# Patient Record
Sex: Male | Born: 1965 | Race: Black or African American | Hispanic: No | Marital: Single | Smoking: Never smoker
Health system: Southern US, Community
[De-identification: ages and names within clinical notes are randomized; demographics above are authoritative.]

## PROBLEM LIST (undated history)

## (undated) DIAGNOSIS — D638 Anemia in other chronic diseases classified elsewhere: Secondary | ICD-10-CM

## (undated) DIAGNOSIS — J189 Pneumonia, unspecified organism: Secondary | ICD-10-CM

## (undated) DIAGNOSIS — R0601 Orthopnea: Secondary | ICD-10-CM

## (undated) DIAGNOSIS — Z992 Dependence on renal dialysis: Secondary | ICD-10-CM

## (undated) DIAGNOSIS — H9190 Unspecified hearing loss, unspecified ear: Secondary | ICD-10-CM

## (undated) DIAGNOSIS — L409 Psoriasis, unspecified: Secondary | ICD-10-CM

## (undated) DIAGNOSIS — I509 Heart failure, unspecified: Secondary | ICD-10-CM

## (undated) DIAGNOSIS — I472 Ventricular tachycardia: Secondary | ICD-10-CM

## (undated) DIAGNOSIS — N189 Chronic kidney disease, unspecified: Secondary | ICD-10-CM

## (undated) DIAGNOSIS — K219 Gastro-esophageal reflux disease without esophagitis: Secondary | ICD-10-CM

## (undated) DIAGNOSIS — I4729 Other ventricular tachycardia: Secondary | ICD-10-CM

## (undated) DIAGNOSIS — J9611 Chronic respiratory failure with hypoxia: Secondary | ICD-10-CM

## (undated) DIAGNOSIS — I5042 Chronic combined systolic (congestive) and diastolic (congestive) heart failure: Secondary | ICD-10-CM

## (undated) DIAGNOSIS — I4901 Ventricular fibrillation: Secondary | ICD-10-CM

## (undated) DIAGNOSIS — I272 Pulmonary hypertension, unspecified: Secondary | ICD-10-CM

## (undated) DIAGNOSIS — G4733 Obstructive sleep apnea (adult) (pediatric): Secondary | ICD-10-CM

## (undated) DIAGNOSIS — E119 Type 2 diabetes mellitus without complications: Secondary | ICD-10-CM

## (undated) DIAGNOSIS — G510 Bell's palsy: Secondary | ICD-10-CM

## (undated) DIAGNOSIS — R0600 Dyspnea, unspecified: Secondary | ICD-10-CM

## (undated) DIAGNOSIS — Z9981 Dependence on supplemental oxygen: Secondary | ICD-10-CM

## (undated) DIAGNOSIS — N19 Unspecified kidney failure: Secondary | ICD-10-CM

## (undated) DIAGNOSIS — J449 Chronic obstructive pulmonary disease, unspecified: Secondary | ICD-10-CM

## (undated) DIAGNOSIS — I2510 Atherosclerotic heart disease of native coronary artery without angina pectoris: Secondary | ICD-10-CM

## (undated) DIAGNOSIS — I10 Essential (primary) hypertension: Secondary | ICD-10-CM

## (undated) DIAGNOSIS — E7800 Pure hypercholesterolemia, unspecified: Secondary | ICD-10-CM

## (undated) DIAGNOSIS — H269 Unspecified cataract: Secondary | ICD-10-CM

## (undated) DIAGNOSIS — N186 End stage renal disease: Secondary | ICD-10-CM

## (undated) DIAGNOSIS — M109 Gout, unspecified: Secondary | ICD-10-CM

## (undated) HISTORY — DX: Anemia in other chronic diseases classified elsewhere: D63.8

## (undated) HISTORY — PX: EYE SURGERY: SHX253

## (undated) HISTORY — DX: End stage renal disease: Z99.2

## (undated) HISTORY — PX: CORONARY ANGIOPLASTY WITH STENT PLACEMENT: SHX49

## (undated) HISTORY — PX: NO PAST SURGERIES: SHX2092

## (undated) HISTORY — DX: Chronic kidney disease, unspecified: N18.9

## (undated) HISTORY — DX: Chronic respiratory failure with hypoxia: J96.11

## (undated) HISTORY — DX: Pulmonary hypertension, unspecified: I27.20

## (undated) HISTORY — DX: Psoriasis, unspecified: L40.9

## (undated) HISTORY — DX: Ventricular fibrillation: I49.01

## (undated) HISTORY — DX: Obstructive sleep apnea (adult) (pediatric): G47.33

## (undated) HISTORY — DX: Chronic combined systolic (congestive) and diastolic (congestive) heart failure: I50.42

## (undated) HISTORY — DX: Dependence on renal dialysis: N18.6

---

## 1998-12-17 DIAGNOSIS — E1169 Type 2 diabetes mellitus with other specified complication: Secondary | ICD-10-CM | POA: Insufficient documentation

## 2009-07-13 ENCOUNTER — Emergency Department: Payer: Self-pay | Admitting: Emergency Medicine

## 2009-12-30 ENCOUNTER — Inpatient Hospital Stay: Payer: Self-pay | Admitting: Internal Medicine

## 2012-08-19 DIAGNOSIS — N185 Chronic kidney disease, stage 5: Secondary | ICD-10-CM | POA: Insufficient documentation

## 2012-08-19 DIAGNOSIS — N186 End stage renal disease: Secondary | ICD-10-CM | POA: Insufficient documentation

## 2012-08-19 DIAGNOSIS — N183 Chronic kidney disease, stage 3 (moderate): Secondary | ICD-10-CM

## 2012-08-19 DIAGNOSIS — I429 Cardiomyopathy, unspecified: Secondary | ICD-10-CM | POA: Insufficient documentation

## 2015-06-14 DIAGNOSIS — I2510 Atherosclerotic heart disease of native coronary artery without angina pectoris: Secondary | ICD-10-CM | POA: Insufficient documentation

## 2016-06-13 ENCOUNTER — Emergency Department
Admission: EM | Admit: 2016-06-13 | Discharge: 2016-06-13 | Disposition: A | Discharge status: Home / Self Care | Payer: BLUE CROSS/BLUE SHIELD | Attending: Emergency Medicine | Admitting: Emergency Medicine

## 2016-06-13 ENCOUNTER — Emergency Department: Payer: BLUE CROSS/BLUE SHIELD

## 2016-06-13 DIAGNOSIS — I509 Heart failure, unspecified: Secondary | ICD-10-CM | POA: Diagnosis not present

## 2016-06-13 DIAGNOSIS — L97812 Non-pressure chronic ulcer of other part of right lower leg with fat layer exposed: Secondary | ICD-10-CM | POA: Diagnosis not present

## 2016-06-13 DIAGNOSIS — Z794 Long term (current) use of insulin: Secondary | ICD-10-CM | POA: Insufficient documentation

## 2016-06-13 DIAGNOSIS — E119 Type 2 diabetes mellitus without complications: Secondary | ICD-10-CM | POA: Insufficient documentation

## 2016-06-13 DIAGNOSIS — L03115 Cellulitis of right lower limb: Secondary | ICD-10-CM | POA: Diagnosis not present

## 2016-06-13 DIAGNOSIS — I110 Hypertensive heart disease with heart failure: Secondary | ICD-10-CM | POA: Insufficient documentation

## 2016-06-13 DIAGNOSIS — Z79899 Other long term (current) drug therapy: Secondary | ICD-10-CM | POA: Diagnosis not present

## 2016-06-13 DIAGNOSIS — M7989 Other specified soft tissue disorders: Secondary | ICD-10-CM

## 2016-06-13 DIAGNOSIS — Z7982 Long term (current) use of aspirin: Secondary | ICD-10-CM | POA: Insufficient documentation

## 2016-06-13 DIAGNOSIS — L97912 Non-pressure chronic ulcer of unspecified part of right lower leg with fat layer exposed: Secondary | ICD-10-CM

## 2016-06-13 HISTORY — DX: Type 2 diabetes mellitus without complications: E11.9

## 2016-06-13 HISTORY — DX: Heart failure, unspecified: I50.9

## 2016-06-13 HISTORY — DX: Bell's palsy: G51.0

## 2016-06-13 HISTORY — DX: Ventricular tachycardia: I47.2

## 2016-06-13 HISTORY — DX: Essential (primary) hypertension: I10

## 2016-06-13 HISTORY — DX: Other ventricular tachycardia: I47.29

## 2016-06-13 HISTORY — DX: Gout, unspecified: M10.9

## 2016-06-13 LAB — CBC
HCT: 40.5 % (ref 40.0–52.0)
Hemoglobin: 13.2 g/dL (ref 13.0–18.0)
MCH: 25.9 pg — ABNORMAL LOW (ref 26.0–34.0)
MCHC: 32.6 g/dL (ref 32.0–36.0)
MCV: 79.7 fL — ABNORMAL LOW (ref 80.0–100.0)
Platelets: 193 10*3/uL (ref 150–440)
RBC: 5.08 MIL/uL (ref 4.40–5.90)
RDW: 18.4 % — ABNORMAL HIGH (ref 11.5–14.5)
WBC: 9.5 10*3/uL (ref 3.8–10.6)

## 2016-06-13 LAB — BASIC METABOLIC PANEL
Anion gap: 9 (ref 5–15)
BUN: 38 mg/dL — ABNORMAL HIGH (ref 6–20)
CO2: 29 mmol/L (ref 22–32)
Calcium: 8.9 mg/dL (ref 8.9–10.3)
Chloride: 100 mmol/L — ABNORMAL LOW (ref 101–111)
Creatinine, Ser: 2.05 mg/dL — ABNORMAL HIGH (ref 0.61–1.24)
GFR calc Af Amer: 43 mL/min — ABNORMAL LOW (ref >60)
GFR calc non Af Amer: 37 mL/min — ABNORMAL LOW (ref >60)
Glucose, Bld: 309 mg/dL — ABNORMAL HIGH (ref 65–99)
Potassium: 4.3 mmol/L (ref 3.5–5.1)
Sodium: 138 mmol/L (ref 135–145)

## 2016-06-13 MED ORDER — CLINDAMYCIN HCL 150 MG PO CAPS
300.0000 mg | ORAL_CAPSULE | Freq: Once | ORAL | Status: AC
  Administered 2016-06-13: 300 mg via ORAL
  Filled 2016-06-13: qty 2

## 2016-06-13 MED ORDER — HYDROCODONE-ACETAMINOPHEN 5-325 MG PO TABS
1.0000 | ORAL_TABLET | Freq: Once | ORAL | Status: AC
  Administered 2016-06-13: 1 via ORAL
  Filled 2016-06-13: qty 1

## 2016-06-13 MED ORDER — CLINDAMYCIN HCL 300 MG PO CAPS: 300.0000 mg | ORAL_CAPSULE | Freq: Three times a day (TID) | ORAL | Status: DC

## 2016-06-13 NOTE — ED Notes (Signed)
Patient transported to X-ray 

## 2016-06-13 NOTE — ED Notes (Addendum)
Pt denies SOB or CP. [name] Kitchen

## 2016-06-13 NOTE — Discharge Instructions (Signed)
You were evaluated for right leg swelling and ulcer, and your being treated for a skin infection, cellulitis. You do also need to follow-up with the [place] wound center to help heal up your nonhealing ulcer.  I wants to follow up with your primary care physician. You're oxygen level was borderline here especially when you fell asleep, which can be seen with sleep apnea. Your exam and x-ray and EKG and blood work are reassuring for no acute problems from heart and lung perspective at this point in time.  Your kidney function is slightly decreased. Follow up with your primary care physician.  Return to the emergency room for any worsening pain, increasing redness or swelling, fever, nausea, sweats, dizziness, passing out, or any chest pain, palpitations, or trouble breathing or any other symptoms concerning to you.   Cellulitis Cellulitis is an infection of the skin and the tissue beneath it. The infected area is usually red and tender. Cellulitis occurs most often in the arms and lower legs.  CAUSES  Cellulitis is caused by bacteria that enter the skin through cracks or cuts in the skin. The most common types of bacteria that cause cellulitis are staphylococci and streptococci. SIGNS AND SYMPTOMS   Redness and warmth.  Swelling.  Tenderness or pain.  Fever. DIAGNOSIS  Your health care provider can usually determine what is wrong based on a physical exam. Blood tests may also be done. TREATMENT  Treatment usually involves taking an antibiotic medicine. HOME CARE INSTRUCTIONS   Take your antibiotic medicine as directed by your health care provider. Finish the antibiotic even if you start to feel better.  Keep the infected arm or leg elevated to reduce swelling.  Apply a warm cloth to the affected area up to 4 times per day to relieve pain.  Take medicines only as directed by your health care provider.  Keep all follow-up visits as directed by your health care provider. SEEK  MEDICAL CARE IF:   You notice red streaks coming from the infected area.  Your red area gets larger or turns dark in color.  Your bone or joint underneath the infected area becomes painful after the skin has healed.  Your infection returns in the same area or another area.  You notice a swollen bump in the infected area.  You develop new symptoms.  You have a fever. SEEK IMMEDIATE MEDICAL CARE IF:   You feel very sleepy.  You develop vomiting or diarrhea.  You have a general ill feeling (malaise) with muscle aches and pains.   This information is not intended to replace advice given to you by your health care provider. Make sure you discuss any questions you have with your health care provider.   Document Released: 09/12/2005 Document Revised: 08/24/2015 Document Reviewed: 02/18/2012 Elsevier Interactive Patient Education 2016 Elsevier Inc[employer]  Edema Edema is an abnormal buildup of fluids in your bodytissues. Edema is somewhatdependent on gravity to pull the fluid to the lowest place in your body. That makes the condition more common in the legs and thighs (lower extremities). Painless swelling of the feet and ankles is common and becomes more likely as you get older. It is also common in looser tissues, like around your eyes.  When the affected area is squeezed, the fluid may move out of that spot and leave a dent for a few moments. This dent is called pitting.  CAUSES  There are many possible causes of edema. Eating too much salt and being on your feet or  sitting for a long time can cause edema in your legs and ankles. Hot weather may make edema worse. Common medical causes of edema include:  Heart failure.  Liver disease.  Kidney disease.  Weak blood vessels in your legs.  Cancer.  An injury.  Pregnancy.  Some medications.  Obesity. SYMPTOMS  Edema is usually painless.Your skin may look swollen or shiny.  DIAGNOSIS  Your health care provider may be able to  diagnose edema by asking about your medical history and doing a physical exam. You may need to have tests such as X-rays, an electrocardiogram, or blood tests to check for medical conditions that may cause edema.  TREATMENT  Edema treatment depends on the cause. If you have heart, liver, or kidney disease, you need the treatment appropriate for these conditions. General treatment may include:  Elevation of the affected body part above the level of your heart.  Compression of the affected body part. Pressure from elastic bandages or support stockings squeezes the tissues and forces fluid back into the blood vessels. This keeps fluid from entering the tissues.  Restriction of fluid and salt intake.  Use of a water pill (diuretic). These medications are appropriate only for some types of edema. They pull fluid out of your body and make you urinate more often. This gets rid of fluid and reduces swelling, but diuretics can have side effects. Only use diuretics as directed by your health care provider. HOME CARE INSTRUCTIONS   Keep the affected body part above the level of your heart when you are lying down.   Do not sit still or stand for prolonged periods.   Do not put anything directly under your knees when lying down.  Do not wear constricting clothing or garters on your upper legs.   Exercise your legs to work the fluid back into your blood vessels. This may help the swelling go down.   Wear elastic bandages or support stockings to reduce ankle swelling as directed by your health care provider.   Eat a low-salt diet to reduce fluid if your health care provider recommends it.   Only take medicines as directed by your health care provider. SEEK MEDICAL CARE IF:   Your edema is not responding to treatment.  You have heart, liver, or kidney disease and notice symptoms of edema.  You have edema in your legs that does not improve after elevating them.   You have sudden and  unexplained weight gain. SEEK IMMEDIATE MEDICAL CARE IF:   You develop shortness of breath or chest pain.   You cannot breathe when you lie down.  You develop pain, redness, or warmth in the swollen areas.   You have heart, liver, or kidney disease and suddenly get edema.  You have a fever and your symptoms suddenly get worse. MAKE SURE YOU:   Understand these instructions.  Will watch your condition.  Will get help right away if you are not doing well or get worse.   This information is not intended to replace advice given to you by your health care provider. Make sure you discuss any questions you have with your health care provider.   Document Released: 12/03/2005 Document Revised: 12/24/2014 Document Reviewed: 09/25/2013 Elsevier Interactive Patient Education [employer].

## 2016-06-13 NOTE — ED Notes (Signed)
Pt taken off 2L nasal cannula per Dr. Reita Cliche.  Pt awake and alert at this time, O2 sats 92-94% on room air at this time.

## 2016-06-13 NOTE — ED Provider Notes (Signed)
[facility] Emergency Department Provider Note   ____________________________________________  Time seen: Approximately 5pm I have reviewed the triage vital signs and the triage nursing note.  HISTORY  Chief Complaint Cellulitis and Skin Ulcer   Historian Patient  HPI [name] is a 50 y.o. male with a history of CHF, hypertension, diabetes, and peripheral neuropathy here for eval of rle swelling, pain and ulcer to the right lateral calf.  It sounds like ulcer opened up several months ago, but it has become larger over time.  The past few days it's become painful in the calf.  He was recently admitted for hospital treatment at [facility] for CHF.  He does not wear home 02.  No fevers.  No vomiting or passing out.  Denies shortness of breath or chest pain or trouble breathing.  He was surprised to hear his o2 level was decreased in triage.  He takes aspirin.    Past Medical History  Diagnosis Date  . Hypertension   . Diabetes mellitus without complication ([place])   . CHF (congestive heart failure) ([place])   . Bell's palsy   . Gout   . Hypercholesteremia   . NSVT (nonsustained ventricular tachycardia) ([facility])     There are no active problems to display for this patient.   History reviewed. No pertinent past surgical history.  Current Outpatient Rx  Name  Route  Sig  Dispense  Refill  . albuterol (PROVENTIL HFA;VENTOLIN HFA) 108 (90 Base) MCG/ACT inhaler   Inhalation   Inhale 2 puffs into the lungs every 4 (four) hours as needed for wheezing or shortness of breath.         . allopurinol (ZYLOPRIM) 100 MG tablet   Oral   Take 200 mg by mouth daily.         [name] Kitchen aspirin EC 81 MG tablet   Oral   Take 81 mg by mouth daily.         [name] Kitchen atorvastatin (LIPITOR) 40 MG tablet   Oral   Take 40 mg by mouth at bedtime.         . carvedilol (COREG) 25 MG tablet   Oral   Take 25 mg by mouth every 12 (twelve) hours.         . gabapentin (NEURONTIN)  300 MG capsule   Oral   Take 300 mg by mouth at bedtime.         . hydrALAZINE (APRESOLINE) 25 MG tablet   Oral   Take 25 mg by mouth 3 (three) times daily.         . insulin NPH Human (HUMULIN N,NOVOLIN N) 100 UNIT/ML injection   Subcutaneous   Inject 40 Units into the skin at bedtime.         . insulin regular (NOVOLIN R,HUMULIN R) 100 units/mL injection   Subcutaneous   Inject 40 Units into the skin 3 (three) times daily before meals.         . isosorbide dinitrate (ISORDIL) 20 MG tablet   Oral   Take 20 mg by mouth 3 (three) times daily.         [name] Kitchen lisinopril (PRINIVIL,ZESTRIL) 10 MG tablet   Oral   Take 10 mg by mouth daily.         . magnesium oxide (MAG-OX) 400 (241.3 Mg) MG tablet   Oral   Take 800 mg by mouth 2 (two) times daily.         . methocarbamol (ROBAXIN) 500  MG tablet   Oral   Take 500 mg by mouth 3 (three) times daily as needed for muscle spasms.         [name] Kitchen omeprazole (PRILOSEC) 20 MG capsule   Oral   Take 20 mg by mouth 2 (two) times daily before a meal.         . potassium chloride SA (K-DUR,KLOR-CON) 20 MEQ tablet   Oral   Take 20 mEq by mouth daily.         [name] Kitchen torsemide (DEMADEX) 20 MG tablet   Oral   Take 20 mg by mouth 2 (two) times daily.         . clindamycin (CLEOCIN) 300 MG capsule   Oral   Take 1 capsule (300 mg total) by mouth 3 (three) times daily.   30 capsule   0     Allergies Review of patient's allergies indicates no known allergies.  No family history on file.  Social History Social History  Substance Use Topics  . Smoking status: Never Smoker   . Smokeless tobacco: None  . Alcohol Use: Yes    Review of Systems  Constitutional: Negative for fever. Eyes: Negative for visual changes. ENT: Negative for sore throat. Cardiovascular: Negative for chest pain. Respiratory: Negative for shortness of breath. Gastrointestinal: Negative for abdominal pain, vomiting and diarrhea. Genitourinary:  Negative for dysuria. Musculoskeletal: Negative for back pain. Skin: Negative for rash. Neurological: Negative for headache. 10 point Review of Systems otherwise negative ____________________________________________   PHYSICAL EXAM:  VITAL SIGNS: ED Triage Vitals  Enc Vitals Group     BP 06/13/16 1556 141/98 mmHg     Pulse Rate 06/13/16 1556 81     Resp 06/13/16 1556 18     Temp 06/13/16 1556 98.8 F (37.1 C)     Temp Source 06/13/16 1556 Oral     SpO2 06/13/16 1556 93 %     Weight 06/13/16 1556 270 lb (122.471 kg)     Height 06/13/16 1556 5\' 4"  (1.626 m)     Head Cir --      Peak Flow --      Pain Score 06/13/16 1556 10     Pain Loc --      Pain Edu? --      Excl. in [place]? --      Constitutional: Alert and oriented. Well appearing and in no distress. HEENT   Head: Normocephalic and atraumatic.      Eyes: Conjunctivae are normal. PERRL. Normal extraocular movements.      Ears:         Nose: No congestion/rhinnorhea.   Mouth/Throat: Mucous membranes are moist.   Neck: No stridor. Cardiovascular/Chest: Normal rate, regular rhythm.  No murmurs, rubs, or gallops. Respiratory: Normal respiratory effort without tachypnea nor retractions. Breath sounds are clear and equal bilaterally. No wheezes/rales/rhonchi. Gastrointestinal: Soft. No distention, no guarding, no rebound. Nontender.    Genitourinary/rectal:Deferred Musculoskeletal: bilateral lower extremities with edema, 2+ rle, 1+ lle.  Psoriasis rash to bilateral lower extremities.  Right lateral leg ulcer, eraser head side with surrounding dusky color skin.  Slightly redder rle at the calf. Neurologic:  Normal speech and language. No gross or focal neurologic deficits are appreciated. Skin:  Skin is warm, dry and intact. Psoriasis on bilateral legs and extremities. Psychiatric: Mood and affect are normal. Speech and behavior are normal. Patient exhibits appropriate insight and  judgment.  ____________________________________________   EKG I, Lisa Roca, MD, the attending physician have personally viewed and  interpreted all ECGs.  74 bpm. Normal sinus rhythm. Nonspecific intraventricular conduction delay. Right axis deviation. Nonspecific ST and T-wave ____________________________________________  LABS (pertinent positives/negatives)  Labs Reviewed  CBC - Abnormal; Notable for the following:    MCV 79.7 (*)    MCH 25.9 (*)    RDW 18.4 (*)    All other components within normal limits  BASIC METABOLIC PANEL - Abnormal; Notable for the following:    Chloride 100 (*)    Glucose, Bld 309 (*)    BUN 38 (*)    Creatinine, Ser 2.05 (*)    GFR calc non Af Amer 37 (*)    GFR calc Af Amer 43 (*)    All other components within normal limits    ____________________________________________  RADIOLOGY All Xrays were viewed by me. Imaging interpreted by Radiologist.  [place] rle:No evidence of deep venous thrombosis in the right lower externa.  Chest two-view: No active cardiopulmonary disease. __________________________________________  PROCEDURES  Procedure(s) performed: None  Critical Care performed: None  ____________________________________________   ED COURSE / ASSESSMENT AND PLAN  Pertinent labs & imaging results that were available during my care of the patient were reviewed by me and considered in my medical decision making (see chart for details).   This patient is here because of worsening pain and redness and swelling of the right lower extremity. He also has a small ulcer, in this patient with diabetic lower extremity with peripheral neuropathy. It does look like there may be super infection/cellulitis this point in time, but no abscess.  I'm going to ultrasound his right lower extremity to rule out DVT.  In triage patient's O2 sat ranged from 89-94% on room air. Patient states that he has COPD and CHF but does not feel like he short of  breath or having any breathing issues. He is not on home O2.  In the ER room when he drifted off to sleep, his O2 sat dropped into the mid 80s, which seem most consistent with sleep apnea. I will go ahead and add on chest x-ray. He is not complaining of pleuritic chest pain or any chest pain.   Patient was able to be off oxygen most of the hospital stay with his O2 sat around 94%. I suspect his O2 sat is most likely related to sleep apnea type symptoms. He is not in acute CHF or acute COPD exacerbation clinically.  I'm not clinically concerned about pulmonary embolism. I did check an oversight of the right lower extremity to rule out DVT due to the swelling and this was negative.  His white blood cell count is reassuring from the standpoint of his right lower externa cellulitis.  Plan we'll refer him to the wound center for close follow-up and management of the nonhealing ulcer.    CONSULTATIONS:   None   Patient / Family / Caregiver informed of clinical course, medical decision-making process, and agree with plan.   I discussed return precautions, follow-up instructions, and discharged instructions with patient and/or family.   ___________________________________________   FINAL CLINICAL IMPRESSION(S) / ED DIAGNOSES   Final diagnoses:  Cellulitis of right leg  Right leg swelling  Chronic ulcer of leg, right, with fat layer exposed ([place])              Note: This dictation was prepared with Dragon dictation. Any transcriptional errors that result from this process are unintentional   Lisa Roca, MD 06/13/16 2026

## 2016-06-13 NOTE — ED Notes (Signed)
Pt oxygen ranges 89% to 94% at time of triage. Pt placed on 2L [place].

## 2016-06-13 NOTE — ED Notes (Signed)
Pt arrives to ER via POV for evaluation of ulcer to right lower leg x months. Pt states that it became painful over the past 2 week. Ulcer approx diameter of end of pinky; on lateral right lower leg, yellow and brown drainage from site. Pt states pain 10/10. Pt alert and oriented X4, active, cooperative, pt in NAD. RR even and unlabored, color WNL.

## 2016-06-13 NOTE — ED Notes (Signed)
Pt in via triage with complaints of ulcer to RLE x a couple months; pt reports noticing that the area was getting bigger and concerned about it being infected.  Area to RLE approximately 1/2 inch in diameter, open ulcer, painful/warm to touch, yellow-brownish odorous discharge.  Pt A/Ox4, in no immediate distress at this time.

## 2016-06-13 NOTE — ED Notes (Signed)
Pt O2 sat reading 84%; pt asleep upon assessment, pt placed on 2L nasal cannula to maintain sat >92%.

## 2016-06-18 ENCOUNTER — Encounter: Payer: BLUE CROSS/BLUE SHIELD | Attending: Surgery | Admitting: Surgery

## 2016-06-18 DIAGNOSIS — Z6841 Body Mass Index (BMI) 40.0 and over, adult: Secondary | ICD-10-CM | POA: Insufficient documentation

## 2016-06-18 DIAGNOSIS — L409 Psoriasis, unspecified: Secondary | ICD-10-CM | POA: Insufficient documentation

## 2016-06-18 DIAGNOSIS — L97212 Non-pressure chronic ulcer of right calf with fat layer exposed: Secondary | ICD-10-CM | POA: Insufficient documentation

## 2016-06-18 DIAGNOSIS — M109 Gout, unspecified: Secondary | ICD-10-CM | POA: Diagnosis not present

## 2016-06-18 DIAGNOSIS — E11622 Type 2 diabetes mellitus with other skin ulcer: Secondary | ICD-10-CM | POA: Insufficient documentation

## 2016-06-18 DIAGNOSIS — E1140 Type 2 diabetes mellitus with diabetic neuropathy, unspecified: Secondary | ICD-10-CM | POA: Diagnosis not present

## 2016-06-18 DIAGNOSIS — F419 Anxiety disorder, unspecified: Secondary | ICD-10-CM | POA: Diagnosis not present

## 2016-06-18 DIAGNOSIS — I5042 Chronic combined systolic (congestive) and diastolic (congestive) heart failure: Secondary | ICD-10-CM | POA: Diagnosis not present

## 2016-06-18 DIAGNOSIS — G4733 Obstructive sleep apnea (adult) (pediatric): Secondary | ICD-10-CM | POA: Insufficient documentation

## 2016-06-18 DIAGNOSIS — I110 Hypertensive heart disease with heart failure: Secondary | ICD-10-CM | POA: Insufficient documentation

## 2016-06-18 NOTE — Progress Notes (Signed)
[name] ([id number]) Visit Report for 06/18/2016 Allergy List Details Patient Name: [name], [name] Date of Service: 06/18/2016 8:45 AM Medical Record Number: [id number] Patient Account Number: [id number] Date of Birth/Sex: [birth year] (50 y.o. Male) Treating RN: [name], RN, BSN, Velva Harman Primary Care Physician: SYSTEM, PCP Other Clinician: Referring Physician: Lisa Roca Treating Physician/Extender: Frann Rider in Treatment: 0 Allergies Active Allergies no known allergies Allergy Notes Electronic Signature(s) Signed: 06/18/2016 3:39:38 PM By: Regan Lemming BSN, RN Entered By: Regan Lemming on 06/18/2016 09:10:44 Schiefelbein, Joel Jerilynn Mages ([id number]) -------------------------------------------------------------------------------- Arrival Information Details Patient Name: [name] Date of Service: 06/18/2016 8:45 AM Medical Record Number: [id number] Patient Account Number: [id number] Date of Birth/Sex: [birth year] (50 y.o. Male) Treating RN: [name], RN, BSN, Velva Harman Primary Care Physician: SYSTEM, PCP Other Clinician: Referring Physician: Lisa Roca Treating Physician/Extender: Frann Rider in Treatment: 0 Visit Information Patient Arrived: Ambulatory Arrival Time: 09:08 Accompanied By: self Transfer Assistance: None Patient Identification Verified: Yes Secondary Verification Process Yes Completed: Patient Requires Transmission- No Based Precautions: Patient Has Alerts: Yes Patient Alerts: Patient on Blood Thinner ASA DM2 A1c 9 Electronic Signature(s) Signed: 06/18/2016 3:39:38 PM By: Regan Lemming BSN, RN Entered By: Regan Lemming on 06/18/2016 09:27:25 Sunga, Brahim M. ([id number]) -------------------------------------------------------------------------------- Clinic Level of Care Assessment Details Patient Name: [name] Date of Service: 06/18/2016 8:45 AM Medical Record Number: [id number] Patient Account Number: [id number] Date of  Birth/Sex: 1966-07-17 (50 y.o. Male) Treating RN: [name], RN, BSN, [place] Primary Care Physician: SYSTEM, PCP Other Clinician: Referring Physician: Lisa Roca Treating Physician/Extender: Frann Rider in Treatment: 0 Clinic Level of Care Assessment Items TOOL 4 Quantity Score []  - Use when only an EandM is performed on FOLLOW-UP visit 0 ASSESSMENTS - Nursing Assessment / Reassessment X - Reassessment of Co-morbidities (includes updates in patient status) 1 10 X - Reassessment of Adherence to Treatment Plan 1 5 ASSESSMENTS - Wound and Skin Assessment / Reassessment X - Simple Wound Assessment / Reassessment - one wound 1 5 []  - Complex Wound Assessment / Reassessment - multiple wounds 0 []  - Dermatologic / Skin Assessment (not related to wound area) 0 ASSESSMENTS - Focused Assessment X - Circumferential Edema Measurements - multi extremities 1 5 []  - Nutritional Assessment / Counseling / Intervention 0 X - Lower Extremity Assessment (monofilament, tuning fork, pulses) 1 5 X - Peripheral Arterial Disease Assessment (using hand held doppler) 1 10 ASSESSMENTS - Ostomy and/or Continence Assessment and Care []  - Incontinence Assessment and Management 0 []  - Ostomy Care Assessment and Management (repouching, etc.) 0 PROCESS - Coordination of Care X - Simple Patient / Family Education for ongoing care 1 15 []  - Complex (extensive) Patient / Family Education for ongoing care 0 X - Staff obtains Programmer, systems, Records, Test Results / Process Orders 1 10 []  - Staff telephones HHA, Nursing Homes / Clarify orders / etc 0 []  - Routine Transfer to another Facility (non-emergent condition) 0 Faux, Gardiner M. ([id number]) []  - Routine Hospital Admission (non-emergent condition) 0 X - New Admissions / Biomedical engineer / Ordering NPWT, Apligraf, etc. 1 15 []  - Emergency Hospital Admission (emergent condition) 0 []  - Simple Discharge Coordination 0 []  - Complex (extensive) Discharge  Coordination 0 PROCESS - Special Needs []  - Pediatric / Minor Patient Management 0 []  - Isolation Patient Management 0 []  - Hearing / Language / Visual special needs 0 []  - Assessment of Community assistance (transportation, D/C planning, etc.) 0 []  - Additional assistance / Altered mentation 0 []  - Support Surface(s)  Assessment (bed, cushion, seat, etc.) 0 INTERVENTIONS - Wound Cleansing / Measurement X - Simple Wound Cleansing - one wound 1 5 []  - Complex Wound Cleansing - multiple wounds 0 X - Wound Imaging (photographs - any number of wounds) 1 5 []  - Wound Tracing (instead of photographs) 0 X - Simple Wound Measurement - one wound 1 5 []  - Complex Wound Measurement - multiple wounds 0 INTERVENTIONS - Wound Dressings X - Small Wound Dressing one or multiple wounds 1 10 []  - Medium Wound Dressing one or multiple wounds 0 []  - Large Wound Dressing one or multiple wounds 0 []  - Application of Medications - topical 0 []  - Application of Medications - injection 0 INTERVENTIONS - Miscellaneous []  - External ear exam 0 Fuquay, Deunta M. ([id number]) []  - Specimen Collection (cultures, biopsies, blood, body fluids, etc.) 0 []  - Specimen(s) / Culture(s) sent or taken to Lab for analysis 0 []  - Patient Transfer (multiple staff / Harrel Lemon Lift / Similar devices) 0 []  - Simple Staple / Suture removal (25 or less) 0 []  - Complex Staple / Suture removal (26 or more) 0 []  - Hypo / Hyperglycemic Management (close monitor of Blood Glucose) 0 X - Ankle / Brachial Index (ABI) - do not check if billed separately 1 15 X - Vital Signs 1 5 Has the patient been seen at the hospital within the last three years: Yes Total Score: 125 Level Of Care: New/Established - Level 4 Electronic Signature(s) Signed: 06/18/2016 3:39:38 PM By: Regan Lemming BSN, RN Entered By: Regan Lemming on 06/18/2016 12:12:16 Lofton, Thaddaeus M.  ([id number]) -------------------------------------------------------------------------------- Encounter Discharge Information Details Patient Name: [name] Date of Service: 06/18/2016 8:45 AM Medical Record Number: [id number] Patient Account Number: [id number] Date of Birth/Sex: 1966/11/15 (50 y.o. Male) Treating RN: [name], RN, BSN, Velva Harman Primary Care Physician: SYSTEM, PCP Other Clinician: Referring Physician: Lisa Roca Treating Physician/Extender: Frann Rider in Treatment: 0 Encounter Discharge Information Items Discharge Pain Level: 0 Discharge Condition: Stable Ambulatory Status: Ambulatory Discharge Destination: Home Transportation: Private Auto Accompanied By: self Schedule Follow-up Appointment: No Medication Reconciliation completed and provided to Patient/Care No Provider: Provided on Clinical Summary of Care: 06/18/2016 Form Type Recipient Paper Patient JT Electronic Signature(s) Signed: 06/18/2016 12:52:09 PM By: Regan Lemming BSN, RN Previous Signature: 06/18/2016 9:47:14 AM Version By: Ruthine Dose Entered By: Regan Lemming on 06/18/2016 12:52:09 Visconti, Gonsalo M. ([id number]) -------------------------------------------------------------------------------- Lower Extremity Assessment Details Patient Name: [name] Date of Service: 06/18/2016 8:45 AM Medical Record Number: [id number] Patient Account Number: [id number] Date of Birth/Sex: 1966-05-12 (50 y.o. Male) Treating RN: [name], RN, BSN, Velva Harman Primary Care Physician: SYSTEM, PCP Other Clinician: Referring Physician: Lisa Roca Treating Physician/Extender: Frann Rider in Treatment: 0 Vascular Assessment Claudication: Claudication Assessment [Left:Intermittent] [Right:Intermittent] Pulses: Posterior Tibial Palpable: [Left:Yes] [Right:Yes] Doppler: [Left:Multiphasic] [Right:Multiphasic] Dorsalis Pedis Palpable: [Left:Yes] [Right:Yes] Doppler: [Left:Multiphasic]  [Right:Multiphasic] Extremity colors, hair growth, and conditions: Extremity Color: [Left:Hyperpigmented] [Right:Hyperpigmented] Hair Growth on Extremity: [Left:No] [Right:No] Temperature of Extremity: [Left:Warm] [Right:Warm] Capillary Refill: [Left:< 3 seconds] [Right:< 3 seconds] Dependent Rubor: [Left:No] [Right:No] Blanched when Elevated: [Left:No] [Right:No] Lipodermatosclerosis: [Left:No] [Right:No] Blood Pressure: Brachial: [Left:110] [Right:97] Dorsalis Pedis: 98 [Left:Dorsalis Pedis: 120] Ankle: Posterior Tibial: [Left:Posterior Tibial: 0.89] [Right:1.09] Toe Nail Assessment Left: Right: Thick: Yes Yes Discolored: Yes Yes Deformed: No No Improper Length and Hygiene: No Electronic Signature(s) Signed: 06/18/2016 3:39:38 PM By: Regan Lemming BSN, RN Entered By: Regan Lemming on 06/18/2016 09:26:39 Fryberger, Dreux M. ([id number]) -------------------------------------------------------------------------------- Multi Wound Chart Details Patient Name: [place] Silos, Peace  M. Date of Service: 06/18/2016 8:45 AM Medical Record Number: [id number] Patient Account Number: [id number] Date of Birth/Sex: 1966-08-12 (50 y.o. Male) Treating RN: [name], RN, BSN, Velva Harman Primary Care Physician: SYSTEM, PCP Other Clinician: Referring Physician: Lisa Roca Treating Physician/Extender: Frann Rider in Treatment: 0 Vital Signs Height(in): 64 Pulse(bpm): 89 Weight(lbs): 274 Blood Pressure 97/69 (mmHg): Body Mass Index(BMI): 47 Temperature(F): 98.2 Respiratory Rate 20 (breaths/min): Photos: [1:No Photos] [N/A:N/A] Wound Location: [1:Right Lower Leg - Lateral] [N/A:N/A] Wounding Event: [1:Gradually Appeared] [N/A:N/A] Primary Etiology: [1:Diabetic Wound/Ulcer of the Lower Extremity] [N/A:N/A] Comorbid History: [1:Anemia, Sleep Apnea, Arrhythmia, Congestive Heart Failure, Hypertension, Peripheral Venous Disease, Type II Diabetes, Gout, Neuropathy] [N/A:N/A] Date Acquired:  [1:04/23/2016] [N/A:N/A] Weeks of Treatment: [1:0] [N/A:N/A] Wound Status: [1:Open] [N/A:N/A] Measurements L x W x D 1.5x1.5x0.5 [N/A:N/A] (cm) Area (cm) : [1:1.767] [N/A:N/A] Volume (cm) : [1:0.884] [N/A:N/A] Classification: [1:Grade 1] [N/A:N/A] Exudate Amount: [1:None Present] [N/A:N/A] Wound Margin: [1:Distinct, outline attached] [N/A:N/A] Granulation Amount: [1:None Present (0%)] [N/A:N/A] Necrotic Amount: [1:Large (67-100%)] [N/A:N/A] Necrotic Tissue: [1:Eschar, Adherent Slough] [N/A:N/A] Exposed Structures: [1:Fascia: No Fat: No Tendon: No Muscle: No Joint: No] [N/A:N/A] Bone: No Limited to Skin Breakdown Epithelialization: None N/A N/A Periwound Skin Texture: Edema: Yes N/A N/A Excoriation: No Induration: No Callus: No Crepitus: No Fluctuance: No Friable: No Rash: No Scarring: No Periwound Skin Dry/Scaly: Yes N/A N/A Moisture: Maceration: No Moist: No Periwound Skin Color: Atrophie Blanche: No N/A N/A Cyanosis: No Ecchymosis: No Erythema: No Hemosiderin Staining: No Mottled: No Pallor: No Rubor: No Temperature: No Abnormality N/A N/A Tenderness on Yes N/A N/A Palpation: Wound Preparation: Ulcer Cleansing: N/A N/A Rinsed/Irrigated with Saline Topical Anesthetic Applied: Other: lidocaine 4 % Treatment Notes Electronic Signature(s) Signed: 06/18/2016 3:39:38 PM By: Regan Lemming BSN, RN Entered By: Regan Lemming on 06/18/2016 09:38:10 Arbuthnot, Gustave Jerilynn Mages ([id number]) -------------------------------------------------------------------------------- [place] Details Patient Name: [name] Date of Service: 06/18/2016 8:45 AM Medical Record Number: [id number] Patient Account Number: [id number] Date of Birth/Sex: 22-Nov-1966 (50 y.o. Male) Treating RN: [name], RN, BSN, Velva Harman Primary Care Physician: SYSTEM, PCP Other Clinician: Referring Physician: Lisa Roca Treating Physician/Extender: Frann Rider in Treatment:  0 Active Inactive Orientation to the Wound Care Program Nursing Diagnoses: Knowledge deficit related to the wound healing center program Goals: Patient/caregiver will verbalize understanding of the [place] Program Date Initiated: 06/18/2016 Goal Status: Active Interventions: Provide education on orientation to the wound center Notes: Venous Leg Ulcer Nursing Diagnoses: Knowledge deficit related to disease process and management Potential for venous Insuffiency (use before diagnosis confirmed) Goals: Patient will maintain optimal edema control Date Initiated: 06/18/2016 Goal Status: Active Patient/caregiver will verbalize understanding of disease process and disease management Date Initiated: 06/18/2016 Goal Status: Active Verify adequate tissue perfusion prior to therapeutic compression application Date Initiated: 06/18/2016 Goal Status: Active Interventions: Compression as ordered Provide education on venous insufficiency Notes: BRYKEN, NEWEY ([id number]) Wound/Skin Impairment Nursing Diagnoses: Impaired tissue integrity Goals: Patient/caregiver will verbalize understanding of skin care regimen Date Initiated: 06/18/2016 Goal Status: Active Ulcer/skin breakdown will have a volume reduction of 30% by week 4 Date Initiated: 06/18/2016 Goal Status: Active Ulcer/skin breakdown will have a volume reduction of 50% by week 8 Date Initiated: 06/18/2016 Goal Status: Active Ulcer/skin breakdown will have a volume reduction of 80% by week 12 Date Initiated: 06/18/2016 Goal Status: Active Ulcer/skin breakdown will heal within 14 weeks Date Initiated: 06/18/2016 Goal Status: Active Interventions: Assess patient/caregiver ability to obtain necessary supplies Assess patient/caregiver ability to perform ulcer/skin care regimen upon admission and  as needed Assess ulceration(s) every visit Provide education on ulcer and skin care Treatment Activities: Skin care regimen  initiated : 06/18/2016 Topical wound management initiated : 06/18/2016 Notes: Electronic Signature(s) Signed: 06/18/2016 12:09:36 PM By: Regan Lemming BSN, RN Entered By: Regan Lemming on 06/18/2016 12:09:36 Howell, Eian M. ([id number]) -------------------------------------------------------------------------------- Pain Assessment Details Patient Name: [name] Date of Service: 06/18/2016 8:45 AM Medical Record Number: [id number] Patient Account Number: [id number] Date of Birth/Sex: 1966-06-29 (50 y.o. Male) Treating RN: [name], RN, BSN, Velva Harman Primary Care Physician: SYSTEM, PCP Other Clinician: Referring Physician: Lisa Roca Treating Physician/Extender: Frann Rider in Treatment: 0 Active Problems Location of Pain Severity and Description of Pain Patient Has Paino Yes Site Locations Pain Location: Pain in Ulcers Rate the pain. Current Pain Level: 7 Worst Pain Level: 7 Character of Pain Describe the Pain: Aching, Tender Pain Management and Medication Current Pain Management: Medication: Yes Cold Application: Yes Rest: Yes Massage: Yes Activity: Yes T.E.N.S.: Yes Heat Application: Yes Leg drop or elevation: Yes Is the Current Pain Management Adequate Adequate: How does your pain impact your activities of daily livingo Sleep: Yes Bathing: Yes Appetite: Yes Relationship With Others: Yes Bladder Continence: Yes Emotions: Yes Bowel Continence: Yes Work: Yes Toileting: Yes Drive: Yes Dressing: Yes Hobbies: Yes Engineer, maintenance) Signed: 06/18/2016 3:39:38 PM By: Regan Lemming BSN, RN Cortright, Van M. ([id number]) Entered By: Regan Lemming on 06/18/2016 09:09:52 Martenson, Caydence Jerilynn Mages ([id number]) -------------------------------------------------------------------------------- Patient/Caregiver Education Details Patient Name: [name] Date of Service: 06/18/2016 8:45 AM Medical Record Number: [id number] Patient Account Number:  [id number] Date of Birth/Gender: 11-14-1966 (50 y.o. Male) Treating RN: [name], RN, BSN, Velva Harman Primary Care Physician: SYSTEM, PCP Other Clinician: Referring Physician: Lisa Roca Treating Physician/Extender: Frann Rider in Treatment: 0 Education Assessment Education Provided To: Patient Education Topics Provided Venous: Methods: Explain/Verbal Responses: State content correctly Welcome To [facility] [place]: Methods: Explain/Verbal Responses: State content correctly Wound/Skin Impairment: Methods: Explain/Verbal Responses: State content correctly Electronic Signature(s) Signed: 06/18/2016 3:39:38 PM By: Regan Lemming BSN, RN Entered By: Regan Lemming on 06/18/2016 12:52:38 Scalisi, Draken Jerilynn Mages ([id number]) -------------------------------------------------------------------------------- Wound Assessment Details Patient Name: [name] Date of Service: 06/18/2016 8:45 AM Medical Record Number: [id number] Patient Account Number: [id number] Date of Birth/Sex: Jun 29, 1966 (50 y.o. Male) Treating RN: [name], RN, BSN, [place] Primary Care Physician: SYSTEM, PCP Other Clinician: Referring Physician: Lisa Roca Treating Physician/Extender: Frann Rider in Treatment: 0 Wound Status Wound Number: 1 Primary Diabetic Wound/Ulcer of the Lower Etiology: Extremity Wound Location: Right Lower Leg - Lateral Wound Open Wounding Event: Gradually Appeared Status: Date Acquired: 04/23/2016 Comorbid Anemia, Sleep Apnea, Arrhythmia, Weeks Of Treatment: 0 History: Congestive Heart Failure, Hypertension, Clustered Wound: No Peripheral Venous Disease, Type II Diabetes, Gout, Neuropathy Photos Photo Uploaded By: Regan Lemming on 06/18/2016 15:09:27 Wound Measurements Length: (cm) 1.5 % Reduction in Width: (cm) 1.5 % Reduction in Depth: (cm) 0.5 Epithelializat Area: (cm) 1.767 Tunneling: Volume: (cm) 0.884 Undermining: Area: Volume: ion: None No No Wound  Description Classification: Grade 1 Foul Odor Aft Wound Margin: Distinct, outline attached Exudate Amount: None Present er Cleansing: No Wound Bed Granulation Amount: None Present (0%) Exposed Structure Necrotic Amount: Large (67-100%) Fascia Exposed: No Necrotic Quality: Eschar, Adherent Slough Fat Layer Exposed: No Tendon Exposed: No Muscle Exposed: No Poke, Kemo M. ([id number]) Joint Exposed: No Bone Exposed: No Limited to Skin Breakdown Periwound Skin Texture Texture Color No Abnormalities Noted: No No Abnormalities Noted: No Callus: No Atrophie Blanche: No Crepitus: No Cyanosis: No Excoriation: No  Ecchymosis: No Fluctuance: No Erythema: No Friable: No Hemosiderin Staining: No Induration: No Mottled: No Localized Edema: Yes Pallor: No Rash: No Rubor: No Scarring: No Temperature / Pain Moisture Temperature: No Abnormality No Abnormalities Noted: No Tenderness on Palpation: Yes Dry / Scaly: Yes Maceration: No Moist: No Wound Preparation Ulcer Cleansing: Rinsed/Irrigated with Saline Topical Anesthetic Applied: Other: lidocaine 4 %, Treatment Notes Wound #1 (Right, Lateral Lower Leg) 1. Cleansed with: Cleanse wound with antibacterial soap and water 4. Dressing Applied: Santyl Ointment 5. Secondary Dressing Applied Gauze and Kerlix/Conform Electronic Signature(s) Signed: 06/18/2016 3:39:38 PM By: Regan Lemming BSN, RN Entered By: Regan Lemming on 06/18/2016 09:20:38 Witcher, Helaman Jerilynn Mages ([id number]) -------------------------------------------------------------------------------- [place] Details Patient Name: [name] Date of Service: 06/18/2016 8:45 AM Medical Record Number: [id number] Patient Account Number: [id number] Date of Birth/Sex: [birth year] (50 y.o. Male) Treating RN: [name], RN, BSN, [place] Primary Care Physician: SYSTEM, PCP Other Clinician: Referring Physician: Lisa Roca Treating Physician/Extender: Frann Rider in  Treatment: 0 Vital Signs Time Taken: 09:09 Temperature (F): 98.2 Height (in): 64 Pulse (bpm): 89 Source: Stated Respiratory Rate (breaths/min): 20 Weight (lbs): 274 Blood Pressure (mmHg): 97/69 Source: Measured Reference Range: 80 - 120 mg / dl Body Mass Index (BMI): 47 Electronic Signature(s) Signed: 06/18/2016 3:39:38 PM By: Regan Lemming BSN, RN Entered By: Regan Lemming on 06/18/2016 09:10:28

## 2016-06-18 NOTE — Progress Notes (Signed)
CAMARON, ABDALA ([id number]) Visit Report for 06/18/2016 Chief Complaint Document Details Cobos, Jaymen Date of Service: 06/18/2016 8:45 AM Patient Name: M. Patient Account Number: [id number] Medical Record [name], RN, BSN, [id number] Treating RN: Number: Velva Harman Date of Birth/Sex: 1966-06-01 (50 y.o. Male) Other Clinician: Primary Care Physician: SYSTEM, PCP Treating Christin Fudge Referring Physician: Lisa Roca Physician/Extender: Suella Grove in Treatment: 0 Information Obtained from: Patient Chief Complaint Patients presents for treatment of an open diabetic ulcer to the right lower extremity near his car for about 3 months Electronic Signature(s) Signed: 06/18/2016 9:45:54 AM By: Christin Fudge MD, FACS Entered By: Christin Fudge on 06/18/2016 09:45:54 Penalver, Artemus M. ([id number]) -------------------------------------------------------------------------------- HPI Details Remmert, Pritesh Date of Service: 06/18/2016 8:45 AM Patient Name: [name] Patient Account Number: [id number] Medical Record [name], RN, BSN, [id number] Treating RN: Number: Velva Harman Date of Birth/Sex: Dec 12, 1966 (50 y.o. Male) Other Clinician: Primary Care Physician: SYSTEM, PCP Treating Christin Fudge Referring Physician: Lisa Roca Physician/Extender: Suella Grove in Treatment: 0 History of Present Illness Location: right lower extremity on the lateral calf area Quality: Patient reports experiencing a sharp pain to affected area(s). Severity: Presents with a pain of ____10_on a pain scale of 1-10. Duration: Patient has had the wound for > 3 months prior to seeking treatment at the wound center Timing: Pain in wound is constant (hurts all the time) Context: The wound occurred when the patient may have scratched vigorously in his sleep because of his psoriasis Modifying Factors: Other treatment(s) tried include:takes treatment for psoriasis with local creams and UVB light Associated Signs and Symptoms: Patient reports  presence of swelling HPI Description: 50 year old gentleman was seen recently in the ER last week with a chief complaints of cellulitis and skin ulcer. He has a history of CHF, Psoriasis, hypertension, diabetes mellitus and peripheral neuropathy. 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 The ulcer was on his right lateral calf and has been there for several months and become larger with time. last hemoglobin A1c was 8.3% in May. He has never been a smoker. he had an ultrasound of the right lower extremity for a DVT study and this was normal. Chest x-ray showed no active disease. He was put on clindamycin 300 mg 3 times a day for 10 days. Electronic Signature(s) Signed: 06/18/2016 9:47:22 AM By: Christin Fudge MD, FACS Previous Signature: 06/18/2016 9:21:51 AM Version By: Christin Fudge MD, FACS Previous Signature: 06/18/2016 9:17:44 AM Version By: Christin Fudge MD, FACS Entered By: Christin Fudge on 06/18/2016 09:47:22 Diesing, Lyan M. ([id number]) -------------------------------------------------------------------------------- Physical Exam Details Brimage, Callan Date of Service: 06/18/2016 8:45 AM Patient Name: M. Patient Account Number: [id number] Medical Record [name], RN, BSN, [id number] Treating RN: Number: Velva Harman Date of Birth/Sex: Dec 08, 1966 (50 y.o. Male) Other Clinician: Primary Care Physician: SYSTEM, PCP Treating Christin Fudge Referring Physician: Lisa Roca Physician/Extender: Weeks in Treatment: 0 Constitutional . Pulse regular. Respirations normal and unlabored. Afebrile. . Eyes Nonicteric. Reactive to light. Ears, Nose, Mouth, and Throat Lips, teeth, and gums WNL.[name] Kitchen Moist mucosa without lesions. Neck supple and nontender. No palpable supraclavicular or cervical adenopathy. Normal sized without goiter. Respiratory WNL. No  retractions.. Cardiovascular Pedal Pulses WNL. ABI on the left is 0.89 on the right is 1.09. No clubbing, cyanosis or edema. Chest Breasts symmetical and no nipple discharge.. Breast tissue WNL, no masses, lumps, or tenderness.. Gastrointestinal (GI) Abdomen without masses or tenderness.. No liver or spleen enlargement or tenderness.. Lymphatic No adneopathy.  No adenopathy. No adenopathy. Musculoskeletal Adexa without tenderness or enlargement.. Digits and nails w/o clubbing, cyanosis, infection, petechiae, ischemia, or inflammatory conditions.. Integumentary (Hair, Skin) he has psoriasis all over his lower extremities and this is significant. No crepitus or fluctuance. No peri- wound warmth or erythema. No masses.[name] Kitchen Psychiatric Judgement and insight Intact.. No evidence of depression, anxiety, or agitation.. Notes the patient has a ulceration on the lateral part of his right calf and this has some surrounding thickening of the skin with a necrotic base which is extremely tender and the tenderness is out of proportion of his examination. Debridement is not possible. Electronic Signature(s) Signed: 06/18/2016 9:49:01 AM By: Christin Fudge MD, FACS Woody, Diarra M. ([id number]) Entered By: Christin Fudge on 06/18/2016 09:49:00 Colombo, Talik Jerilynn Mages ([id number]) -------------------------------------------------------------------------------- Physician Orders Details Square, Hudsyn Date of Service: 06/18/2016 8:45 AM Patient Name: M. Patient Account Number: [id number] Medical Record [name], RN, BSN, [id number] Treating RN: Number: Velva Harman Date of Birth/Sex: [birth year] (50 y.o. Male) Other Clinician: Primary Care Physician: SYSTEM, PCP Treating Britto, Errol Referring Physician: Lisa Roca Physician/Extender: Suella Grove in Treatment: 0 Verbal / Phone Orders: Yes Clinician: [name], RN, BSN, [name] Read Back and Verified: Yes Diagnosis Coding Wound Cleansing Wound #1 Right,Lateral Lower  Leg o Cleanse wound with mild soap and water Anesthetic Wound #1 Right,Lateral Lower Leg o Topical Lidocaine 4% cream applied to wound bed prior to debridement Primary Wound Dressing Wound #1 Right,Lateral Lower Leg o Santyl Ointment Secondary Dressing Wound #1 Right,Lateral Lower Leg o Gauze and Kerlix/Conform Dressing Change Frequency Wound #1 Right,Lateral Lower Leg o Change dressing every day. Follow-up Appointments Wound #1 Right,Lateral Lower Leg o Return Appointment in 1 week. Additional Orders / Instructions Wound #1 Right,Lateral Lower Leg o Increase protein intake. o Activity as tolerated Medications-please add to medication list. Wound #1 Right,Lateral Lower Leg o Santyl Enzymatic Ointment TEMPLE, SCHANKE ([id number]) Electronic Signature(s) Signed: 06/18/2016 3:39:38 PM By: Regan Lemming BSN, RN Signed: 06/18/2016 4:11:02 PM By: Christin Fudge MD, FACS Previous Signature: 06/18/2016 9:45:20 AM Version By: Christin Fudge MD, FACS Entered By: Regan Lemming on 06/18/2016 09:46:30 Derner, Kalev M. ([id number]) -------------------------------------------------------------------------------- Problem List Details Dawkins, Duriel Date of Service: 06/18/2016 8:45 AM Patient Name: M. Patient Account Number: [id number] Medical Record [name], RN, BSN, [id number] Treating RN: Number: Velva Harman Date of Birth/Sex: 08-Nov-1966 (50 y.o. 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 ([id number]) -------------------------------------------------------------------------------- Progress Note Details Arismendez, Kyndall Date of Service: 06/18/2016 8:45 AM Patient Name: [name] Patient Account Number: [id number] Medical Record [name], RN, BSN, [id number] Treating RN: Number: Velva Harman Date of Birth/Sex: 1966/05/26 (50 y.o. Male) Other Clinician: Primary Care Physician: SYSTEM, PCP Treating Britto, Errol Referring Physician: Lisa Roca Physician/Extender: Suella Grove in Treatment: 0 Subjective Chief Complaint Information obtained from Patient Patients presents for treatment of an open diabetic ulcer to the right lower extremity near his car for about 3 months History of Present Illness (HPI) The following HPI elements were documented for the patient's wound: Location: right lower extremity on the lateral calf area  Quality: Patient reports experiencing a sharp pain to affected area(s). Severity: Presents with a pain of ____10_on a pain scale of 1-10. Duration: Patient has had the wound for > 3 months prior to seeking treatment at the wound center Timing: Pain in wound is constant (hurts all the time) Context: The wound occurred when the patient may have scratched vigorously in his sleep because of his psoriasis Modifying Factors: Other treatment(s) tried include:takes treatment for psoriasis with local creams and UVB light Associated Signs and Symptoms: Patient reports presence of swelling 50 year old gentleman was seen recently in the ER last week with a chief complaints of cellulitis and skin ulcer. He has a history of CHF, Psoriasis, hypertension, diabetes mellitus and peripheral neuropathy. he also has a past medical history of Bell's palsy, gout, ischemic cardiomyopathy, chronic systolic and diastolic heart failure with  ejection fraction of 40%ventricular tachycardia. The ulcer was on his right lateral calf and has been there for several months and become larger with time. last hemoglobin A1c was 8.3% in May. He has never been a smoker. he had an ultrasound of the right lower extremity for a DVT study and this was normal. Chest x-ray showed no active disease. He was put on clindamycin 300 mg 3 times a day for 10 days. Wound History Patient presents with 1 open wound that has been present for approximately 70mos. Patient has been treating wound in the following manner: peroxide. Laboratory tests have not been performed in the last month. Patient reportedly has not tested positive for an antibiotic resistant organism. Patient reportedly has not tested positive for osteomyelitis. Patient reportedly has not had testing performed to evaluate circulation in the legs. Patient experiences the following problems associated with their wounds: infection, swelling. Patient History SIMAR, WENHOLD ([id number]) Allergies no known allergies Family History Cancer - Maternal Grandparents, Diabetes - Mother, Mother, Heart Disease - Mother, Hereditary Spherocytosis - Maternal Grandparents, Hypertension - Siblings, Mother, Paternal Grandparents, Maternal Grandparents, Kidney Disease - Siblings, Stroke - Maternal Grandparents, Paternal Grandparents, No family history of Lung Disease, Seizures, Thyroid Problems, Tuberculosis. Social History Never smoker, Marital Status - Single, Alcohol Use - Never, Drug Use - No History, Caffeine Use - Rarely. Medical History Ear/Nose/Mouth/Throat Denies history of Chronic sinus problems/congestion, Middle ear problems Hematologic/Lymphatic Patient has history of Anemia Respiratory Patient has history of Sleep Apnea Cardiovascular Patient has history of Arrhythmia, Congestive Heart Failure, Hypertension, Peripheral Venous Disease Gastrointestinal Denies history of Cirrhosis , Colitis,  Crohn s, Hepatitis A, Hepatitis B, Hepatitis C Endocrine Patient has history of Type II Diabetes Genitourinary Denies history of End Stage Renal Disease Musculoskeletal Patient has history of Gout Denies history of Rheumatoid Arthritis, Osteoarthritis, Osteomyelitis Neurologic Patient has history of Neuropathy Oncologic Denies history of Received Chemotherapy, Received Radiation Psychiatric Denies history of Anorexia/bulimia Patient is treated with Insulin, Oral Agents. Medical And Surgical History Notes Immunological Psoriasis Integumentary (Skin) Psoriasis Review of Systems (ROS) Constitutional Symptoms (General Health) The patient has no complaints or symptoms. Eyes Complains or has symptoms of Glasses / Contacts. DIMITRIOS, SACHTLEBEN ([id number]) Ear/Nose/Mouth/Throat The patient has no complaints or symptoms. Hematologic/Lymphatic Complains or has symptoms of Bleeding / Clotting Disorders. Respiratory Complains or has symptoms of Shortness of Breath. Cardiovascular Complains or has symptoms of LE edema. Gastrointestinal The patient has no complaints or symptoms. Endocrine The patient has no complaints or symptoms. Genitourinary The patient has no complaints or symptoms. Immunological The patient has no complaints or symptoms. Integumentary (Skin) Complains or has symptoms of Wounds, Swelling. Musculoskeletal  The patient has no complaints or symptoms. Neurologic Complains or has symptoms of Numbness/parasthesias. Oncologic The patient has no complaints or symptoms. Psychiatric Complains or has symptoms of Anxiety. Medications Santyl 250 unit/gram topical ointment topical ointment topical as directed I have reviewed a list of his medications which include albuterol, allopurinol, aspirin, Lipitor, Diprolene 0.5% cream, Coreg, Temovate, Lotrimin, Neurontin, Aprisoline, hydrocortisone cream, insulin, Isordil, omeprazole,  Demadex. Objective Constitutional Pulse regular. Respirations normal and unlabored. Afebrile. Vitals Time Taken: 9:09 AM, Height: 64 in, Source: Stated, Weight: 274 lbs, Source: Measured, BMI: 47, Temperature: 98.2 F, Pulse: 89 bpm, Respiratory Rate: 20 breaths/min, Blood Pressure: 97/69 mmHg. Polcyn, Vasilios M. ([id number]) Eyes Nonicteric. Reactive to light. Ears, Nose, Mouth, and Throat Lips, teeth, and gums WNL.[name] Kitchen Moist mucosa without lesions. Neck supple and nontender. No palpable supraclavicular or cervical adenopathy. Normal sized without goiter. Respiratory WNL. No retractions.. Cardiovascular Pedal Pulses WNL. ABI on the left is 0.89 on the right is 1.09. No clubbing, cyanosis or edema. Chest Breasts symmetical and no nipple discharge.. Breast tissue WNL, no masses, lumps, or tenderness.. Gastrointestinal (GI) Abdomen without masses or tenderness.. No liver or spleen enlargement or tenderness.. Lymphatic No adneopathy. No adenopathy. No adenopathy. Musculoskeletal Adexa without tenderness or enlargement.. Digits and nails w/o clubbing, cyanosis, infection, petechiae, ischemia, or inflammatory conditions.[name] Kitchen Psychiatric Judgement and insight Intact.. No evidence of depression, anxiety, or agitation.. General Notes: the patient has a ulceration on the lateral part of his right calf and this has some surrounding thickening of the skin with a necrotic base which is extremely tender and the tenderness is out of proportion of his examination. Debridement is not possible. Integumentary (Hair, Skin) he has psoriasis all over his lower extremities and this is significant. No crepitus or fluctuance. No peri- wound warmth or erythema. No masses.. Wound #1 status is Open. Original cause of wound was Gradually Appeared. The wound is located on the Right,Lateral Lower Leg. The wound measures 1.5cm length x 1.5cm width x 0.5cm depth; 1.767cm^2 area and 0.884cm^3 volume. The wound is  limited to skin breakdown. There is no tunneling or undermining noted. There is a none present amount of drainage noted. The wound margin is distinct with the outline attached to the wound base. There is no granulation within the wound bed. There is a large (67-100%) amount of necrotic tissue within the wound bed including Eschar and Adherent Slough. The periwound skin appearance exhibited: Localized Edema, Dry/Scaly. The periwound skin appearance did not exhibit: Callus, Crepitus, Excoriation, Fluctuance, Friable, Induration, Rash, Scarring, Maceration, Moist, Atrophie Blanche, Cyanosis, Ecchymosis, Hemosiderin Staining, Mottled, Pallor, Rubor, Erythema. [place] ([id number]) was noted as No Abnormality. The periwound has tenderness on palpation. Assessment Active Problems ICD-10 E11.622 - Type 2 diabetes mellitus with other skin ulcer L97.212 - Non-pressure chronic ulcer of right calf with fat layer exposed L40.9 - Psoriasis, unspecified E66.01 - Morbid (severe) obesity due to excess calories 50 year old diabetic patient with a severe case of psoriasis has had an excoriation and ulceration of the right lateral calf which has been treated with clindamycin recently. Debridement is not possible because of low threshold for pain and tenderness which is way out of proportion of his examination. I have recommended: 1. Nursing this area with soap and water daily and scrubbing the area well 2. Santyl ointment locally to be changed daily 3. Regular visits to the wound center 4. Completing his course of clindamycin asked prescribed by the ER Plan Wound Cleansing: Wound #1 Right,Lateral Lower Leg: Cleanse  wound with mild soap and water Anesthetic: Wound #1 Right,Lateral Lower Leg: Topical Lidocaine 4% cream applied to wound bed prior to debridement Primary Wound Dressing: Wound #1 Right,Lateral Lower Leg: Santyl Ointment Secondary Dressing: Wound #1  Right,Lateral Lower Leg: Gauze and Kerlix/Conform Dressing Change Frequency: Wound #1 Right,Lateral Lower Leg: Change dressing every day. Follow-up Appointments: Wound #1 Right,Lateral Lower Leg: HOLMAN, MARATEA. ([id number]) Return Appointment in 1 week. Additional Orders / Instructions: Wound #1 Right,Lateral Lower Leg: Increase protein intake. Activity as tolerated Medications-please add to medication list.: Wound #1 Right,Lateral Lower Leg: Santyl Enzymatic Ointment 50 year old diabetic patient with a severe case of psoriasis has had an excoriation and ulceration of the right lateral calf which has been treated with clindamycin recently. Debridement is not possible because of low threshold for pain and tenderness which is way out of proportion of his examination. I have recommended: 1. Nursing this area with soap and water daily and scrubbing the area well 2. Santyl ointment locally to be changed daily 3. Regular visits to the wound center 4. Completing his course of clindamycin asked prescribed by the ER Electronic Signature(s) Signed: 06/18/2016 9:52:09 AM By: Christin Fudge MD, FACS Entered By: Christin Fudge on 06/18/2016 09:52:09 Arya, Westen M. ([id number]) -------------------------------------------------------------------------------- ROS/PFSH Details Mousseau, Kelin Date of Service: 06/18/2016 8:45 AM Patient Name: [name] Patient Account Number: [id number] Medical Record [name], RN, BSN, [id number] Treating RN: Number: Velva Harman Date of Birth/Sex: 02-Jan-1966 (50 y.o. Male) Other Clinician: Primary Care Physician: SYSTEM, PCP Treating Britto, Errol Referring Physician: Lisa Roca Physician/Extender: Suella Grove in Treatment: 0 Wound History Do you currently have one or more open woundso Yes How many open wounds do you currently haveo 1 Approximately how long have you had your woundso 47mos How have you been treating your wound(s) until nowo peroxide Has your wound(s)  ever healed and then re-openedo No Have you had any lab work done in the past montho No Have you tested positive for an antibiotic resistant organism (MRSA, VRE)o No Have you tested positive for osteomyelitis (bone infection)o No Have you had any tests for circulation on your legso No Have you had other problems associated with your woundso Infection, Swelling Eyes Complaints and Symptoms: Positive for: Glasses / Contacts Hematologic/Lymphatic Complaints and Symptoms: Positive for: Bleeding / Clotting Disorders Medical History: Positive for: Anemia Respiratory Complaints and Symptoms: Positive for: Shortness of Breath Medical History: Positive for: Sleep Apnea Cardiovascular Complaints and Symptoms: Positive for: LE edema Medical History: Positive for: Arrhythmia; Congestive Heart Failure; Hypertension; Peripheral Venous Disease Hinojos, Aloysuis M. ([id number]) Integumentary (Skin) Complaints and Symptoms: Positive for: Wounds; Swelling Medical History: Past Medical History Notes: Psoriasis Neurologic Complaints and Symptoms: Positive for: Numbness/parasthesias Medical History: Positive for: Neuropathy Psychiatric Complaints and Symptoms: Positive for: Anxiety Medical History: Negative for: Anorexia/bulimia Constitutional Symptoms (General Health) Complaints and Symptoms: No Complaints or Symptoms Ear/Nose/Mouth/Throat Complaints and Symptoms: No Complaints or Symptoms Medical History: Negative for: Chronic sinus problems/congestion; Middle ear problems Gastrointestinal Complaints and Symptoms: No Complaints or Symptoms Medical History: Negative for: Cirrhosis ; Colitis; Crohnos; Hepatitis A; Hepatitis B; Hepatitis C Endocrine Complaints and Symptoms: No Complaints or Symptoms Medical History: Positive for: Type II Diabetes Fulp, Keshawn M. ([id number]) Time with diabetes: 15 years Treated with: Insulin, Oral agents Genitourinary Complaints and  Symptoms: No Complaints or Symptoms Medical History: Negative for: End Stage Renal Disease Immunological Complaints and Symptoms: No Complaints or Symptoms Medical History: Past Medical History Notes: Psoriasis Musculoskeletal Complaints and Symptoms: No Complaints or Symptoms Medical History: Positive for: Gout  Negative for: Rheumatoid Arthritis; Osteoarthritis; Osteomyelitis Oncologic Complaints and Symptoms: No Complaints or Symptoms Medical History: Negative for: Received Chemotherapy; Received Radiation Family and Social History Cancer: Yes - Maternal Grandparents; Diabetes: Yes - Mother, Mother; Heart Disease: Yes - Mother; Hereditary Spherocytosis: Yes - Maternal Grandparents; Hypertension: Yes - Siblings, Mother, Paternal Grandparents, Maternal Grandparents; Kidney Disease: Yes - Siblings; Lung Disease: No; Seizures: No; Stroke: Yes - Maternal Grandparents, Paternal Grandparents; Thyroid Problems: No; Tuberculosis: No; Never smoker; Marital Status - Single; Alcohol Use: Never; Drug Use: No History; Caffeine Use: Rarely; Financial Concerns: No; Food, Clothing or Shelter Needs: No; Support System Lacking: No; Transportation Concerns: No; Advanced Directives: No; Patient does not want information on Advanced Directives; Living Will: No Physician Affirmation I have reviewed and agree with the above information. KINDELL, KREITLER ([id number]) Electronic Signature(s) Signed: 06/18/2016 3:39:38 PM By: Regan Lemming BSN, RN Signed: 06/18/2016 4:11:02 PM By: Christin Fudge MD, FACS Entered By: Christin Fudge on 06/18/2016 09:22:05 Bowerman, Cloy M. ([id number]) -------------------------------------------------------------------------------- SuperBill Details Crissman, Jaycub Date of Service: 06/18/2016 Patient Name: [name] Patient Account Number: [id number] Medical Record [name], RN, BSN, [id number] Treating RN: Number: Velva Harman Date of Birth/Sex: Dec 11, 1966 (50 y.o. Male) Other  Clinician: Primary Care Physician: SYSTEM, PCP Treating Christin Fudge Referring Physician: Lisa Roca Physician/Extender: Suella Grove in Treatment: 0 Diagnosis Coding ICD-10 Codes Code Description E11.622 Type 2 diabetes mellitus with other skin ulcer L97.212 Non-pressure chronic ulcer of right calf with fat layer exposed L40.9 Psoriasis, unspecified E66.01 Morbid (severe) obesity due to excess calories Facility Procedures CPT4 Code: [id number] Description: 99214 - WOUND CARE VISIT-LEV 4 EST PT Modifier: Quantity: 1 Physician Procedures CPT4 Code: [id number] Description: [id number] - WC PHYS LEVEL 4 - NEW PT ICD-10 Description Diagnosis E11.622 Type 2 diabetes mellitus with other skin ulcer L97.212 Non-pressure chronic ulcer of right calf with fat L40.9 Psoriasis, unspecified E66.01 Morbid (severe) obesity due to  excess calories Modifier: layer exposed Quantity: 1 Electronic Signature(s) Signed: 06/18/2016 12:12:32 PM By: Regan Lemming BSN, RN Signed: 06/18/2016 4:11:02 PM By: Christin Fudge MD, FACS Previous Signature: 06/18/2016 9:52:51 AM Version By: Christin Fudge MD, FACS Entered By: Regan Lemming on 06/18/2016 12:12:32

## 2016-06-18 NOTE — Progress Notes (Signed)
JDEN, DURST ([id number]) Visit Report for 06/18/2016 Abuse/Suicide Risk Screen Details Gantt, Lamarr Date of Service: 06/18/2016 8:45 AM Patient Name: M. Patient Account Number: [id number] Medical Record [name], RN, BSN, [id number] Treating RN: Number: Velva Harman Date of Birth/Sex: Dec 01, 1966 (50 y.o. Male) Other Clinician: Primary Care Physician: SYSTEM, PCP Treating Britto, Errol Referring Physician: Lisa Roca Physician/Extender: Suella Grove in Treatment: 0 Abuse/Suicide Risk Screen Items Answer ABUSE/SUICIDE RISK SCREEN: Has anyone close to you tried to hurt or harm you recentlyo No Do you feel uncomfortable with anyone in your familyo No Has anyone forced you do things that you didnot want to doo No Do you have any thoughts of harming yourselfo No Patient displays signs or symptoms of abuse and/or neglect. No Electronic Signature(s) Signed: 06/18/2016 3:39:38 PM By: Regan Lemming BSN, RN Entered By: Regan Lemming on 06/18/2016 09:10:53 Esquibel, Ayven M. ([id number]) -------------------------------------------------------------------------------- Activities of Daily Living Details Laske, Kalen Date of Service: 06/18/2016 8:45 AM Patient Name: M. Patient Account Number: [id number] Medical Record [name], RN, BSN, [id number] Treating RN: Number: Velva Harman Date of Birth/Sex: 1966-09-14 (50 y.o. Male) Other Clinician: Primary Care Physician: SYSTEM, PCP Treating Christin Fudge Referring Physician: Lisa Roca Physician/Extender: Suella Grove in Treatment: 0 Activities of Daily Living Items Answer Activities of Daily Living (Please select one for each item) Drive Automobile Completely Able Take Medications Completely Able Use Telephone Completely Able Care for Appearance Completely Able Use Toilet Completely Able Bath / Shower Completely Able Dress Self Completely Able Feed Self Completely Able Walk Completely Able Get In / Out Bed Completely Able Housework Completely Able Prepare  Meals Completely [place] for Self Completely Able Electronic Signature(s) Signed: 06/18/2016 3:39:38 PM By: Regan Lemming BSN, RN Entered By: Regan Lemming on 06/18/2016 09:12:25 Perrell, Raekwon Jerilynn Mages ([id number]) -------------------------------------------------------------------------------- Education Assessment Details Edling, Meziah Date of Service: 06/18/2016 8:45 AM Patient Name: [name] Patient Account Number: [id number] Medical Record [name], RN, BSN, [id number] Treating RN: Number: Velva Harman Date of Birth/Sex: [birth year] (50 y.o. Male) Other Clinician: Primary Care Physician: SYSTEM, PCP Treating Christin Fudge Referring Physician: Lisa Roca Physician/Extender: Suella Grove in Treatment: 0 Primary Learner Assessed: Patient Learning Preferences/Education Level/Primary Language Learning Preference: Explanation Highest Education Level: College or Above Preferred Language: English Cognitive Barrier Assessment/Beliefs Language Barrier: No Physical Barrier Assessment Impaired Vision: Yes Glasses Impaired Hearing: No Decreased Hand dexterity: No Knowledge/Comprehension Assessment Knowledge Level: High Comprehension Level: High Ability to understand written High instructions: Ability to understand verbal High instructions: Motivation Assessment Anxiety Level: Calm Cooperation: Cooperative Education Importance: Acknowledges Need Interest in Health Problems: Asks Questions Perception: Coherent Willingness to Engage in Self- High Management Activities: Readiness to Engage in Self- High Management Activities: Electronic Signature(s) Signed: 06/18/2016 3:39:38 PM By: Regan Lemming BSN, RN Entered By: Regan Lemming on 06/18/2016 09:12:03 Cervantes, Astin M. ([id number]) Sattler, Arda M. ([id number]) -------------------------------------------------------------------------------- Fall Risk Assessment Details Leffler, Yasir Date of Service: 06/18/2016 8:45  AM Patient Name: [name] Patient Account Number: [id number] Medical Record [name], RN, BSN, [id number] Treating RN: Number: Velva Harman Date of Birth/Sex: Nov 14, 1966 (50 y.o. Male) Other Clinician: Primary Care Physician: SYSTEM, PCP Treating Britto, Errol Referring Physician: Lisa Roca Physician/Extender: Suella Grove in Treatment: 0 Fall Risk Assessment Items Have you had 2 or more falls in the last 12 monthso 0 No Have you had any fall that resulted in injury in the last 12 monthso 0 No FALL RISK ASSESSMENT: History of falling - immediate or within 3 months 0 No Secondary diagnosis 0 No Ambulatory aid None/bed rest/wheelchair/nurse 0 Yes Crutches/cane/walker 0 No Furniture 0  No IV Access/Saline Lock 0 No Gait/Training Normal/bed rest/immobile 0 Yes Weak 0 No Impaired 0 No Mental Status Oriented to own ability 0 Yes Electronic Signature(s) Signed: 06/18/2016 3:39:38 PM By: Regan Lemming BSN, RN Entered By: Regan Lemming on 06/18/2016 09:11:06 Lear, Gerardo M. ([id number]) -------------------------------------------------------------------------------- Foot Assessment Details Vangorder, Apollo Date of Service: 06/18/2016 8:45 AM Patient Name: [name] Patient Account Number: [id number] Medical Record [name], RN, BSN, [id number] Treating RN: Number: Velva Harman Date of Birth/Sex: 10-Jan-1966 (50 y.o. Male) Other Clinician: Primary Care Physician: SYSTEM, PCP Treating Britto, Errol Referring Physician: Lisa Roca Physician/Extender: Suella Grove in Treatment: 0 Foot Assessment Items Site Locations + = Sensation present, - = Sensation absent, C = Callus, U = Ulcer R = Redness, W = Warmth, M = Maceration, PU = Pre-ulcerative lesion F = Fissure, S = Swelling, D = Dryness Assessment Right: Left: Other Deformity: No No Prior Foot Ulcer: No No Prior Amputation: No No Charcot Joint: No No Ambulatory Status: Ambulatory Without Help Gait: Steady Electronic Signature(s) Signed: 06/18/2016 3:39:38 PM By: Regan Lemming BSN, RN Entered By: Regan Lemming on 06/18/2016 09:11:38 Winiarski, Latrell M. ([id number]) -------------------------------------------------------------------------------- Nutrition Risk Assessment Details Rookstool, Torrion Date of Service: 06/18/2016 8:45 AM Patient Name: [name] Patient Account Number: [id number] Medical Record [name], RN, BSN, [id number] Treating RN: Number: Velva Harman Date of Birth/Sex: [birth year] (50 y.o. Male) Other Clinician: Primary Care Physician: SYSTEM, PCP Treating Britto, Errol Referring Physician: Lisa Roca Physician/Extender: Suella Grove in Treatment: 0 Height (in): 64 Weight (lbs): 274 Body Mass Index (BMI): 47 Nutrition Risk Assessment Items NUTRITION RISK SCREEN: I have an illness or condition that made me change the kind and/or 0 No amount of food I eat I eat fewer than two meals per day 0 No I eat few fruits and vegetables, or milk products 0 No I have three or more drinks of beer, liquor or wine almost every day 0 No I have tooth or mouth problems that make it hard for me to eat 0 No I don't always have enough money to buy the food I need 0 No I eat alone most of the time 0 No I take three or more different prescribed or over-the-counter drugs a 0 No day Without wanting to, I have lost or gained 10 pounds in the last six 0 No months I am not always physically able to shop, cook and/or feed myself 0 No Nutrition Protocols Good Risk Protocol 0 No interventions needed Moderate Risk Protocol Electronic Signature(s) Signed: 06/18/2016 3:39:38 PM By: Regan Lemming BSN, RN Entered By: Regan Lemming on 06/18/2016 09:11:19

## 2016-06-25 ENCOUNTER — Encounter: Payer: Self-pay | Admitting: General Surgery

## 2016-06-25 ENCOUNTER — Encounter (HOSPITAL_BASED_OUTPATIENT_CLINIC_OR_DEPARTMENT_OTHER): Payer: BLUE CROSS/BLUE SHIELD | Admitting: General Surgery

## 2016-06-25 DIAGNOSIS — E11622 Type 2 diabetes mellitus with other skin ulcer: Secondary | ICD-10-CM | POA: Diagnosis not present

## 2016-06-25 DIAGNOSIS — E1142 Type 2 diabetes mellitus with diabetic polyneuropathy: Secondary | ICD-10-CM | POA: Insufficient documentation

## 2016-06-25 DIAGNOSIS — IMO0002 Reserved for concepts with insufficient information to code with codable children: Secondary | ICD-10-CM

## 2016-06-25 NOTE — Progress Notes (Signed)
Debrided wound right leg 1.5 cm.  Will Rx with Santyl daily

## 2016-06-26 NOTE — Progress Notes (Signed)
ROMELLO, SPIELBERGER ([id number]) Visit Report for 06/25/2016 Arrival Information Details Patient Name: [name], [name] Date of Service: 06/25/2016 2:15 PM Medical Record Number: [id number] Patient Account Number: [id number] Date of Birth/Sex: 1966/08/20 (50 y.o. Male) Treating RN: Macarthur Critchley Primary Care Physician: SYSTEM, PCP Other Clinician: Referring Physician: Lisa Roca Treating Physician/Extender: Benjaman Pott in Treatment: 1 Visit Information History Since Last Visit All ordered tests and consults were completed: No Patient Arrived: Ambulatory Added or deleted any medications: No Arrival Time: 14:11 Any new allergies or adverse reactions: No Accompanied By: self Had a fall or experienced change in No Transfer Assistance: None activities of daily living that may affect Patient Identification Verified: Yes risk of falls: Secondary Verification Process Yes Signs or symptoms of abuse/neglect since last No Completed: visito Patient Requires Transmission- No Hospitalized since last visit: No Based Precautions: Has Dressing in Place as Prescribed: Yes Patient Has Alerts: Yes Pain Present Now: No Patient Alerts: Patient on Blood Thinner ASA DM2 A1c 9 Electronic Signature(s) Signed: 06/25/2016 4:23:35 PM By: Rebecca Eaton, RN, Sendra Entered By: Rebecca Eaton RN, Sendra on 06/25/2016 14:12:13 [name], [name] ([id number]) -------------------------------------------------------------------------------- Encounter Discharge Information Details Patient Name: [name] Date of Service: 06/25/2016 2:15 PM Medical Record Number: [id number] Patient Account Number: [id number] Date of Birth/Sex: 15-Jan-1966 (50 y.o. Male) Treating RN: Macarthur Critchley Primary Care Physician: SYSTEM, PCP Other Clinician: Referring Physician: Lisa Roca Treating Physician/Extender: Benjaman Pott in Treatment: 1 Encounter Discharge Information Items Schedule  Follow-up Appointment: No Medication Reconciliation completed and No provided to Patient/Care Provider: Clinical Summary of Care: Provided Form Type Recipient Paper Patient JT Electronic Signature(s) Signed: 06/25/2016 2:50:18 PM By: Judene Companion MD Previous Signature: 06/25/2016 2:36:24 PM Version By: Ruthine Dose Entered By: Judene Companion on 06/25/2016 14:50:18 [name], [name] ([id number]) -------------------------------------------------------------------------------- Lower Extremity Assessment Details Patient Name: [name] Date of Service: 06/25/2016 2:15 PM Medical Record Number: [id number] Patient Account Number: [id number] Date of Birth/Sex: 1966/06/12 (50 y.o. Male) Treating RN: Macarthur Critchley Primary Care Physician: SYSTEM, PCP Other Clinician: Referring Physician: Lisa Roca Treating Physician/Extender: Judene Companion Weeks in Treatment: 1 Edema Assessment Assessed: [Left: No] [Right: No] Edema: [Left: N] [Right: o] Vascular Assessment Pulses: Posterior Tibial Dorsalis Pedis Palpable: [Right:Yes] Extremity colors, hair growth, and conditions: Extremity Color: [Right:Normal] Hair Growth on Extremity: [Right:No] Temperature of Extremity: [Right:Warm] Capillary Refill: [Right:< 3 seconds] Toe Nail Assessment Left: Right: Thick: No Discolored: No Deformed: No Improper Length and Hygiene: No Electronic Signature(s) Signed: 06/25/2016 4:23:35 PM By: Rebecca Eaton, RN, Sendra Entered By: Rebecca Eaton RN, Sendra on 06/25/2016 14:18:49 [name], [name] ([id number]) -------------------------------------------------------------------------------- Pain Assessment Details Patient Name: [name] Date of Service: 06/25/2016 2:15 PM Medical Record Number: [id number] Patient Account Number: [id number] Date of Birth/Sex: [birth year] (50 y.o. Male) Treating RN: Macarthur Critchley Primary Care Physician: SYSTEM, PCP Other Clinician: Referring Physician:  Lisa Roca Treating Physician/Extender: Benjaman Pott in Treatment: 1 Active Problems Location of Pain Severity and Description of Pain Patient Has Paino No Site Locations Pain Management and Medication Current Pain Management: Electronic Signature(s) Signed: 06/25/2016 4:23:35 PM By: Rebecca Eaton, RN, Sendra Entered By: Rebecca Eaton RN, Sendra on 06/25/2016 14:12:40 [name], [name] ([id number]) -------------------------------------------------------------------------------- Patient/Caregiver Education Details Patient Name: [name] Date of Service: 06/25/2016 2:15 PM Medical Record Number: [id number] Patient Account Number: [id number] Date of Birth/Gender: [birth year] (50 y.o. Male) Treating RN: Macarthur Critchley Primary Care Physician: SYSTEM, PCP Other Clinician: Referring Physician: Lisa Roca Treating Physician/Extender: Benjaman Pott in Treatment: 1 Education Assessment Education Provided To: Patient  Education Topics Provided Wound/Skin Impairment: Handouts: Caring for Your Ulcer, Skin Care Do's and Dont's Methods: Explain/Verbal Responses: State content correctly Electronic Signature(s) Signed: 06/26/2016 1:16:39 PM By: Judene Companion MD Entered By: Judene Companion on 06/25/2016 14:50:27 [name], [name] ([id number]) -------------------------------------------------------------------------------- Wound Assessment Details Patient Name: [name] Date of Service: 06/25/2016 2:15 PM Medical Record Number: [id number] Patient Account Number: [id number] Date of Birth/Sex: 03-Mar-1966 (50 y.o. Male) Treating RN: Macarthur Critchley Primary Care Physician: SYSTEM, PCP Other Clinician: Referring Physician: Lisa Roca Treating Physician/Extender: Benjaman Pott in Treatment: 1 Wound Status Wound Number: 1 Primary Diabetic Wound/Ulcer of the Lower Etiology: Extremity Wound Location: Right Lower Leg - Lateral Wound Open Wounding Event:  Gradually Appeared Status: Date Acquired: 04/23/2016 Comorbid Anemia, Sleep Apnea, Arrhythmia, Weeks Of Treatment: 1 History: Congestive Heart Failure, Clustered Wound: No Hypertension, Peripheral Venous Disease, Type II Diabetes, Gout, Neuropathy Photos Photo Uploaded By: Rebecca Eaton, RN, Roslynn Amble on 06/25/2016 15:48:03 Wound Measurements Length: (cm) 1.2 Width: (cm) 1.4 Depth: (cm) 0.4 Area: (cm) 1.319 Volume: (cm) 0.528 % Reduction in Area: 25.4% % Reduction in Volume: 40.3% Epithelialization: None Tunneling: No Undermining: No Wound Description Classification: Grade 1 Wound Margin: Distinct, outline attached Exudate Amount: Large Exudate Type: Serosanguineous Exudate Color: red, brown Foul Odor After Cleansing: No Wound Bed Granulation Amount: None Present (0%) Exposed Structure Necrotic Amount: Large (67-100%) Fascia Exposed: No Hilgers, [name] ([id number]) Necrotic Quality: Adherent Slough Fat Layer Exposed: No Tendon Exposed: No Muscle Exposed: No Joint Exposed: No Bone Exposed: No Limited to Skin Breakdown Periwound Skin Texture Texture Color No Abnormalities Noted: No No Abnormalities Noted: No Callus: No Atrophie Blanche: No Crepitus: No Cyanosis: No Excoriation: No Ecchymosis: No Fluctuance: No Erythema: No Friable: No Hemosiderin Staining: No Induration: No Mottled: No Localized Edema: Yes Pallor: No Rash: No Rubor: No Scarring: No Temperature / Pain Moisture Temperature: No Abnormality No Abnormalities Noted: No Tenderness on Palpation: Yes Dry / Scaly: Yes Maceration: No Moist: No Wound Preparation Ulcer Cleansing: Rinsed/Irrigated with Saline Topical Anesthetic Applied: Other: lidocaine 4 %, Treatment Notes Wound #1 (Right, Lateral Lower Leg) 1. Cleansed with: Clean wound with Normal Saline 2. Anesthetic Topical Lidocaine 4% cream to wound bed prior to debridement 4. Dressing Applied: Santyl Ointment 5. Secondary  Dressing Applied Gauze and Kerlix/Conform Electronic Signature(s) Signed: 06/25/2016 4:23:35 PM By: Rebecca Eaton, RN, Sendra Entered By: Rebecca Eaton RN, Sendra on 06/25/2016 14:18:26 [name], [name] ([id number]) -------------------------------------------------------------------------------- [place] Details Patient Name: [name] Date of Service: 06/25/2016 2:15 PM Medical Record Number: [id number] Patient Account Number: [id number] Date of Birth/Sex: 1966-05-20 (50 y.o. Male) Treating RN: Macarthur Critchley Primary Care Physician: SYSTEM, PCP Other Clinician: Referring Physician: Lisa Roca Treating Physician/Extender: Benjaman Pott in Treatment: 1 Vital Signs Time Taken: 14:13 Temperature (F): 98.0 Height (in): 64 Pulse (bpm): 78 Weight (lbs): 274 Blood Pressure (mmHg): 150/91 Body Mass Index (BMI): 47 Reference Range: 80 - 120 mg / dl Electronic Signature(s) Signed: 06/25/2016 4:23:35 PM By: Rebecca Eaton RN, Sendra Entered By: Rebecca Eaton RN, Sendra on 06/25/2016 14:13:38

## 2016-06-26 NOTE — Progress Notes (Addendum)
KAORI, ROEHL ([id number]) Visit Report for 06/25/2016 Chief Complaint Document Details Patient Name: [name], [name] Date of Service: 06/25/2016 2:15 PM Medical Record Number: [id number] Patient Account Number: [id number] Date of Birth/Sex: June 21, 1966 (50 y.o. Male) Treating RN: Macarthur Critchley Primary Care Physician: SYSTEM, PCP Other Clinician: Referring Physician: Lisa Roca Treating Physician/Extender: Benjaman Pott in Treatment: 1 Information Obtained from: Patient Chief Complaint Patients presents for treatment of an open diabetic ulcer to the right lower extremity near his car for about 3 months Electronic Signature(s) Signed: 06/25/2016 2:46:18 PM By: Judene Companion MD Entered By: Judene Companion on 06/25/2016 14:46:17 [place]ton, [name] ([id number]) -------------------------------------------------------------------------------- Debridement Details Patient Name: [name] Date of Service: 06/25/2016 2:15 PM Medical Record Number: [id number] Patient Account Number: [id number] Date of Birth/Sex: [birth year] (50 y.o. Male) Treating RN: Macarthur Critchley Primary Care Physician: SYSTEM, PCP Other Clinician: Referring Physician: Lisa Roca Treating Physician/Extender: Benjaman Pott in Treatment: 1 Debridement Performed for Wound #1 Right,Lateral Lower Leg Assessment: Performed By: Physician Judene Companion, MD Debridement: Debridement Pre-procedure Yes Verification/Time Out Taken: Start Time: 14:20 Pain Control: Lidocaine 4% Topical Solution Level: Skin/Subcutaneous Tissue Total Area Debrided (L x 1.2 (cm) x 1.4 (cm) = 1.68 (cm) W): Instrument: Curette, Forceps, Scissors Bleeding: Minimum Hemostasis Achieved: Pressure End Time: 14:25 Procedural Pain: 0 Post Procedural Pain: 10 Response to Treatment: Procedure was tolerated well Post Debridement Measurements of Total Wound Length: (cm) 1.2 Width: (cm) 1.4 Depth: (cm) 0.5 Volume:  (cm) 0.66 Post Procedure Diagnosis Same as Pre-procedure Electronic Signature(s) Signed: 06/25/2016 4:23:35 PM By: Ardean Larsen Signed: 06/26/2016 1:16:39 PM By: Judene Companion MD Entered By: Rebecca Eaton RN, Sendra on 06/25/2016 14:25:57 Tappan, [name] ([id number]) -------------------------------------------------------------------------------- HPI Details Patient Name: [name] Date of Service: 06/25/2016 2:15 PM Medical Record Number: [id number] Patient Account Number: [id number] Date of Birth/Sex: [birth year] (50 y.o. Male) Treating RN: Macarthur Critchley Primary Care Physician: SYSTEM, PCP Other Clinician: Referring Physician: Lisa Roca Treating Physician/Extender: Benjaman Pott in Treatment: 1 History of Present Illness Location: right lower extremity on the lateral calf area Quality: Patient reports experiencing a sharp pain to affected area(s). Severity: Presents with a pain of ____10_on a pain scale of 1-10. Duration: Patient has had the wound for > 3 months prior to seeking treatment at the wound center Timing: Pain in wound is constant (hurts all the time) Context: The wound occurred when the patient may have scratched vigorously in his sleep because of his psoriasis Modifying Factors: Other treatment(s) tried include:takes treatment for psoriasis with local creams and UVB light Associated Signs and Symptoms: Patient reports presence of swelling HPI Description: 50 year old gentleman was seen recently in the ER last week with a chief complaints of cellulitis and skin ulcer. He has a history of CHF, Psoriasis, hypertension, diabetes mellitus and peripheral neuropathy. 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 The ulcer was on his right lateral calf and has been there for several months and become larger with  time. last hemoglobin A1c was 8.3% in May. He has never been a smoker. he had an ultrasound of the right lower extremity for a DVT study and this was normal. Chest x-ray showed no active disease. He was put on clindamycin 300 mg 3 times a day for 10 days. Electronic Signature(s) Signed: 06/25/2016 2:46:29 PM By: Judene Companion MD Entered By: Judene Companion on 06/25/2016 14:46:28 Gilkes, Roc M. ([id number]) -------------------------------------------------------------------------------- Physical  Exam Details Patient Name: [name], [name] Date of Service: 06/25/2016 2:15 PM Medical Record Number: [id number] Patient Account Number: [id number] Date of Birth/Sex: May 15, 1966 (50 y.o. Male) Treating RN: Macarthur Critchley Primary Care Physician: SYSTEM, PCP Other Clinician: Referring Physician: Lisa Roca Treating Physician/Extender: Judene Companion Weeks in Treatment: 1 Electronic Signature(s) Signed: 06/25/2016 2:46:43 PM By: Judene Companion MD Entered By: Judene Companion on 06/25/2016 14:46:43 Taulbee, [name] ([id number]) -------------------------------------------------------------------------------- Physician Orders Details Patient Name: [name] Date of Service: 06/25/2016 2:15 PM Medical Record Number: [id number] Patient Account Number: [id number] Date of Birth/Sex: 11/22/1966 (50 y.o. Male) Treating RN: Macarthur Critchley Primary Care Physician: SYSTEM, PCP Other Clinician: Referring Physician: Lisa Roca Treating Physician/Extender: Benjaman Pott in Treatment: 1 Verbal / Phone Orders: Yes Clinician: Macarthur Critchley Read Back and Verified: Yes Diagnosis Coding Wound Cleansing Wound #1 Right,Lateral Lower Leg o Cleanse wound with mild soap and water Primary Wound Dressing Wound #1 Right,Lateral Lower Leg o Santyl Ointment Secondary Dressing Wound #1 Right,Lateral Lower Leg o Gauze and Kerlix/Conform Dressing Change Frequency Wound #1  Right,Lateral Lower Leg o Change dressing every day. Follow-up Appointments Wound #1 Right,Lateral Lower Leg o Return Appointment in 1 week. Additional Orders / Instructions Wound #1 Right,Lateral Lower Leg o Increase protein intake. o Activity as tolerated Medications-please add to medication list. Wound #1 Right,Lateral Lower Leg o Santyl Enzymatic Ointment Electronic Signature(s) Signed: 06/25/2016 4:23:35 PM By: Rebecca Eaton RN, Roslynn Amble Signed: 06/26/2016 1:16:39 PM By: Judene Companion MD Entered By: Rebecca Eaton RN, Sendra on 06/25/2016 14:26:58 Hinchey, [name] ([id number]) [name], [name] ([id number]) -------------------------------------------------------------------------------- Problem List Details Patient Name: [name] Date of Service: 06/25/2016 2:15 PM Medical Record Number: [id number] Patient Account Number: [id number] Date of Birth/Sex: 10-Aug-1966 (50 y.o. 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 ([id number]) -------------------------------------------------------------------------------- Progress Note Details Patient Name: [name] Date of Service: 06/25/2016 2:15 PM Medical Record Number: [id number] Patient Account Number: [id number] Date of Birth/Sex: 03/31/1966 (50 y.o.  Male) Treating RN: Macarthur Critchley Primary Care Physician: SYSTEM, PCP Other Clinician: Referring Physician: Lisa Roca Treating Physician/Extender: Benjaman Pott in Treatment: 1 Subjective Chief Complaint Information obtained from Patient Patients presents for treatment of an open diabetic ulcer to the right lower extremity near his car for about 3 months History of Present Illness (HPI) The following HPI elements were documented for the patient's wound: Location: right lower extremity on the lateral calf area Quality: Patient reports experiencing a sharp pain to affected area(s). Severity: Presents with a pain of ____10_on a pain scale of 1-10. Duration: Patient has had the wound for > 3 months prior to seeking treatment at the wound center Timing: Pain in wound is constant (hurts all the time) Context: The wound occurred when the patient may have scratched vigorously in his sleep because of his psoriasis Modifying Factors: Other treatment(s) tried include:takes treatment for psoriasis with local creams and UVB light Associated Signs and Symptoms: Patient reports presence of swelling 50 year old gentleman was seen recently in the  ER last week with a chief complaints of cellulitis and skin ulcer. He has a history of CHF, Psoriasis, hypertension, diabetes mellitus and peripheral neuropathy. 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 The ulcer was on his right lateral calf and has been there for several months and become larger with time. last hemoglobin A1c was 8.3% in May. He has never been a smoker. he had an ultrasound of the right lower extremity for a DVT study and this was normal. Chest x-ray showed no active disease. He was put on clindamycin 300 mg 3 times a day for 10 days. Objective Constitutional Vitals Time Taken: 2:13 PM,  Height: 64 in, Weight: 274 lbs, BMI: 47, Temperature: 98.0 F, Pulse: 78 bpm, Blood Pressure: 150/91 mmHg. [name], [name] ([id number]) Integumentary (Hair, Skin) Wound #1 status is Open. Original cause of wound was Gradually Appeared. The wound is located on the Right,Lateral Lower Leg. The wound measures 1.2cm length x 1.4cm width x 0.4cm depth; 1.319cm^2 area and 0.528cm^3 volume. The wound is limited to skin breakdown. There is no tunneling or undermining noted. There is a large amount of serosanguineous drainage noted. The wound margin is distinct with the outline attached to the wound base. There is no granulation within the wound bed. There is a large (67- 100%) amount of necrotic tissue within the wound bed including Adherent Slough. The periwound skin appearance exhibited: Localized Edema, Dry/Scaly. The periwound skin appearance did not exhibit: Callus, Crepitus, Excoriation, Fluctuance, Friable, Induration, Rash, Scarring, Maceration, Moist, Atrophie Blanche, Cyanosis, Ecchymosis, Hemosiderin Staining, Mottled, Pallor, Rubor, Erythema. Periwound temperature was noted as No Abnormality. The periwound has tenderness on palpation. Assessment Active Problems ICD-10 E11.622 - Type 2 diabetes mellitus with other skin ulcer L97.212 - Non-pressure chronic ulcer of right calf with fat layer exposed L40.9 - Psoriasis, unspecified E66.01 - Morbid (severe) obesity due to excess calories Procedures Wound #1 Wound #1 is a Diabetic Wound/Ulcer of the Lower Extremity located on the Right,Lateral Lower Leg . There was a Skin/Subcutaneous Tissue Debridement [id number]) debridement with total area of 1.68 sq cm performed by Judene Companion, MD. with the following instrument(s): Curette, Forceps, and Scissors after achieving pain control using Lidocaine 4% Topical Solution. A time out was conducted prior to the start of the procedure. A Minimum amount of bleeding was controlled with Pressure.  The procedure was tolerated well with a pain level of 0 throughout and a pain level of 10 following the procedure. Post Debridement Measurements: 1.2cm length x 1.4cm width x 0.5cm depth; 0.66cm^3 volume. Post procedure Diagnosis Wound #1: Same as Pre-Procedure Debrided with curette and dressed with aguacell . Wound 1 cm size [place], [name] ([id number]) Plan Wound Cleansing: Wound #1 Right,Lateral Lower Leg: Cleanse wound with mild soap and water Primary Wound Dressing: Wound #1 Right,Lateral Lower Leg: Santyl Ointment Secondary Dressing: Wound #1 Right,Lateral Lower Leg: Gauze and Kerlix/Conform Dressing Change Frequency: Wound #1 Right,Lateral Lower Leg: Change dressing every day. Follow-up Appointments: Wound #1 Right,Lateral Lower Leg: Return Appointment in 1 week. Additional Orders / Instructions: Wound #1 Right,Lateral Lower Leg: Increase protein intake. Activity as tolerated Medications-please add to medication list.: Wound #1 Right,Lateral Lower Leg: Santyl Enzymatic Ointment Follow-Up Appointments: A Patient Clinical Summary of Care was provided to JT Electronic Signature(s) Signed: 07/06/2016 12:10:44 PM By: Judene Companion MD Previous Signature: 06/25/2016 2:48:45 PM Version By: Judene Companion MD Entered By: Judene Companion on 07/06/2016  12:10:44 [name], [name] ([id number]) -------------------------------------------------------------------------------- SuperBill Details Patient Name: [name], [name] Date of Service: 06/25/2016 Medical Record Number: [id number] Patient Account Number: [id number] Date of Birth/Sex: May 12, 1966 (49 y.o. Male) Treating RN: Macarthur Critchley Primary Care Physician: SYSTEM, PCP Other Clinician: Referring Physician: Lisa Roca Treating Physician/Extender: Judene Companion Weeks in Treatment: 1 Diagnosis Coding ICD-10 Codes Code Description E11.622 Type 2 diabetes mellitus with other skin ulcer L97.212 Non-pressure chronic  ulcer of right calf with fat layer exposed L40.9 Psoriasis, unspecified E66.01 Morbid (severe) obesity due to excess calories Facility Procedures CPT4 Code: [id number] Description: [id number] - DEB SUBQ TISSUE 20 SQ CM/< ICD-10 Description Diagnosis L97.212 Non-pressure chronic ulcer of right calf with fat Modifier: layer exposed Quantity: 1 Physician Procedures CPT4 Code: [id number] Description: [id number] - WC PHYS LEVEL 3 - EST PT ICD-10 Description Diagnosis E11.622 Type 2 diabetes mellitus with other skin ulcer Modifier: Quantity: 1 CPT4 Code: [id number] Description: 11042 - WC PHYS SUBQ TISS 20 SQ CM ICD-10 Description Diagnosis L97.212 Non-pressure chronic ulcer of right calf with fat Modifier: layer exposed Quantity: 1 Electronic Signature(s) Signed: 06/25/2016 2:49:48 PM By: Judene Companion MD Entered By: Judene Companion on 06/25/2016 14:49:48

## 2016-07-05 ENCOUNTER — Encounter: Payer: BLUE CROSS/BLUE SHIELD | Admitting: Surgery

## 2016-07-05 DIAGNOSIS — E11622 Type 2 diabetes mellitus with other skin ulcer: Secondary | ICD-10-CM | POA: Diagnosis not present

## 2016-07-06 NOTE — Progress Notes (Signed)
[name], [name] ([id number]) Visit Report for 07/05/2016 Chief Complaint Document Details [name], [name] 07/05/2016 10:00 Patient Name: Date of Service: Brown. AM Medical Record Patient Account Number: [id number] Number: Treating RN: [name], RN, BSN, Velva Harman Date of Birth/Sex: 08/17/1966 (50 y.o. Male) Other Clinician: Primary Care Physician: SYSTEM, PCP Treating Christin Fudge Referring Physician: Lisa Roca Physician/Extender: Suella Grove in Treatment: 2 Information Obtained from: Patient Chief Complaint Patients presents for treatment of an open diabetic ulcer to the right lower extremity near his car for about 3 months Electronic Signature(s) Signed: 07/05/2016 11:13:53 AM By: Christin Fudge MD, FACS Entered By: Christin Fudge on 07/05/2016 11:13:53 [name], [name] -------------------------------------------------------------------------------- Debridement Details [name], [name] 07/05/2016 10:00 Patient Name: Date of Service: Brown. AM Medical Record Patient Account Number: [id number] Number: Treating RN: [name], RN, BSN, Velva Harman Date of Birth/Sex: 01-11-1966 (50 y.o. Male) Other Clinician: Primary Care Physician: SYSTEM, PCP Treating Britto, Errol Referring Physician: Lisa Roca Physician/Extender: Suella Grove in Treatment: 2 Debridement Performed for Wound #1 Right,Lateral Lower Leg Assessment: Performed By: Physician Christin Fudge, MD Debridement: Debridement Pre-procedure Yes Verification/Time Out Taken: Start Time: 10:30 Pain Control: Lidocaine 4% Topical Solution Level: Skin/Subcutaneous Tissue Total Area Debrided (L x 1.5 (cm) x 1.5 (cm) = 2.25 (cm) W): Tissue and other Non-Viable, Fibrin/Slough, Subcutaneous material debrided: Instrument: Curette Bleeding: Minimum Hemostasis Achieved: Pressure End Time: 10:35 Procedural Pain: 4 Post Procedural Pain: 4 Response to Treatment: Procedure was tolerated well Post Procedure  Diagnosis Same as Pre-procedure Electronic Signature(s) Signed: 07/05/2016 11:13:47 AM By: Christin Fudge MD, FACS Signed: 07/05/2016 3:37:03 PM By: Regan Lemming BSN, RN Entered By: Christin Fudge on 07/05/2016 11:13:47 Costales, [name] -------------------------------------------------------------------------------- HPI Details [name], [name] 07/05/2016 10:00 Patient Name: Date of Service: Brown. AM Medical Record Patient Account Number: [id number] Number: Treating RN: [name], RN, BSN, Velva Harman Date of Birth/Sex: 17-Apr-1966 (50 y.o. Male) Other Clinician: Primary Care Physician: SYSTEM, PCP Treating Britto, Errol Referring Physician: Lisa Roca Physician/Extender: Weeks in Treatment: 2 History of Present Illness Location: right lower extremity on the lateral calf area Quality: Patient reports experiencing a sharp pain to affected area(s). Severity: Presents with a pain of ____10_on a pain scale of 1-10. Duration: Patient has had the wound for > 3 months prior to seeking treatment at the wound center Timing: Pain in wound is constant (hurts all the time) Context: The wound occurred when the patient may have scratched vigorously in his sleep because of his psoriasis Modifying Factors: Other treatment(s) tried include:takes treatment for psoriasis with local creams and UVB light Associated Signs and Symptoms: Patient reports presence of swelling HPI Description: 50 year old gentleman was seen recently in the ER last week with a chief complaints of cellulitis and skin ulcer. He has a history of CHF, Psoriasis, hypertension, diabetes mellitus and peripheral neuropathy. 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 The ulcer was on his right lateral calf and has been there for several months and become larger with time. last hemoglobin  A1c was 8.3% in May. He has never been a smoker. he had an ultrasound of the right lower extremity for a DVT study and this was normal. Chest x-ray showed no active disease. He was put on clindamycin 300 mg 3 times a day for 10 days. Electronic Signature(s) Signed: 07/05/2016 11:13:58 AM By: Christin Fudge MD, FACS Entered By: Christin Fudge on 07/05/2016 11:13:58 [name], [name] -------------------------------------------------------------------------------- Physical Exam Details [name],  [name] 07/05/2016 10:00 Patient Name: Date of Service: Brown. AM Medical Record Patient Account Number: [id number] Number: Treating RN: [name], RN, BSN, Velva Harman Date of Birth/Sex: [birth year] (50 y.o. Male) Other Clinician: Primary Care Physician: SYSTEM, PCP Treating Britto, Errol Referring Physician: Lisa Roca Physician/Extender: Weeks in Treatment: 2 Constitutional . Pulse regular. Respirations normal and unlabored. Afebrile. . Eyes Nonicteric. Reactive to light. Ears, Nose, Mouth, and Throat Lips, teeth, and gums WNL.[name] Kitchen Moist mucosa without lesions. Neck supple and nontender. No palpable supraclavicular or cervical adenopathy. Normal sized without goiter. Respiratory WNL. No retractions.. Cardiovascular Pedal Pulses WNL. No clubbing, cyanosis or edema. Lymphatic No adneopathy. No adenopathy. No adenopathy. Musculoskeletal Adexa without tenderness or enlargement.. Digits and nails w/o clubbing, cyanosis, infection, petechiae, ischemia, or inflammatory conditions.. Integumentary (Hair, Skin) No suspicious lesions. No crepitus or fluctuance. No peri-wound warmth or erythema. No masses.[name] Kitchen Psychiatric Judgement and insight Intact.. No evidence of depression, anxiety, or agitation.. Notes the wound is about the same size and has some debris at the edges and the base and are sharply to remove this with a curet but his pain is way out of proportion and I could not  complete the job. No bleeding. Electronic Signature(s) Signed: 07/05/2016 11:14:39 AM By: Christin Fudge MD, FACS Entered By: Christin Fudge on 07/05/2016 11:14:39 [name], [name] MMarland Kitchen ([id number]) -------------------------------------------------------------------------------- Physician Orders Details [name], [name] 07/05/2016 10:00 Patient Name: Date of Service: Brown. AM Medical Record Patient Account Number: [id number] Number: Treating RN: [name], RN, BSN, Velva Harman Date of Birth/Sex: 16-Sep-1966 (50 y.o. Male) Other Clinician: Primary Care Physician: SYSTEM, PCP Treating Britto, Errol Referring Physician: Lisa Roca Physician/Extender: Suella Grove in Treatment: 2 Verbal / Phone Orders: Yes Clinician: [name], RN, BSN, [name] Read Back and Verified: Yes Diagnosis Coding Wound Cleansing Wound #1 Right,Lateral Lower Leg o Cleanse wound with mild soap and water Primary Wound Dressing Wound #1 Right,Lateral Lower Leg o Santyl Ointment Secondary Dressing Wound #1 Right,Lateral Lower Leg o Gauze and Kerlix/Conform Dressing Change Frequency Wound #1 Right,Lateral Lower Leg o Change dressing every day. Follow-up Appointments Wound #1 Right,Lateral Lower Leg o Return Appointment in 1 week. Additional Orders / Instructions Wound #1 Right,Lateral Lower Leg o Increase protein intake. o Activity as tolerated Medications-please add to medication list. Wound #1 Right,Lateral Lower Leg o Santyl Enzymatic Ointment Electronic Signature(s) Signed: 07/05/2016 3:37:03 PM By: Regan Lemming BSN, RN Signed: 07/05/2016 4:28:14 PM By: Christin Fudge MD, FACS [name], [name] ([id number]) Entered By: Regan Lemming on 07/05/2016 10:32:32 [name], [name] ([id number]) -------------------------------------------------------------------------------- Problem List Details [name], [name] 07/05/2016 10:00 Patient Name: Date of Service: Brown. AM Medical Record Patient Account Number:  [id number] Number: Treating RN: [name], RN, BSN, Velva Harman Date of Birth/Sex: 12/28/1965 (50 y.o. 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 ([id number]) -------------------------------------------------------------------------------- Progress Note Details [name], [name] 07/05/2016 10:00 Patient Name: Date of Service: Brown. AM Medical Record Patient Account Number: [id number] Number: Treating RN: [name], RN, BSN, Velva Harman Date of Birth/Sex: Nov 10, 1966 (50 y.o. Male) Other Clinician: Primary Care Physician: SYSTEM,  PCP Treating Britto, Errol Referring Physician: Lisa Roca Physician/Extender: Weeks in Treatment: 2 Subjective Chief Complaint Information obtained from Patient Patients presents for treatment of an open diabetic ulcer to the right lower extremity near his car for about 3 months History of Present Illness (HPI) The following HPI elements were documented for the patient's wound: Location: right lower extremity on the lateral calf area Quality: Patient reports experiencing a sharp pain to affected area(s). Severity: Presents with a pain of ____10_on a pain scale of 1-10. Duration: Patient has had the wound for > 3 months prior to seeking treatment at the wound center Timing: Pain in wound is constant (hurts all the  time) Context: The wound occurred when the patient may have scratched vigorously in his sleep because of his psoriasis Modifying Factors: Other treatment(s) tried include:takes treatment for psoriasis with local creams and UVB light Associated Signs and Symptoms: Patient reports presence of swelling 50 year old gentleman was seen recently in the ER last week with a chief complaints of cellulitis and skin ulcer. He has a history of CHF, Psoriasis, hypertension, diabetes mellitus and peripheral neuropathy. 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 The ulcer was on his right lateral calf and has been there for several months and become larger with time. last hemoglobin A1c was 8.3% in May. He has never been a smoker. he had an ultrasound of the right lower extremity for a DVT study and this was normal. Chest x-ray showed no active disease. He was put on clindamycin 300 mg 3 times a day for 10 days. Objective Constitutional Pulse regular. Respirations normal and unlabored. Afebrile. [name], [name] Brown. ([id number]) Vitals Time Taken: 10:18 AM, Height: 64 in, Weight: 274 lbs, BMI: 47, Temperature: 98.4 F, Pulse: 76 bpm, Respiratory Rate: 20 breaths/min, Blood Pressure: 119/57 mmHg. Eyes Nonicteric. Reactive to light. Ears, Nose, Mouth, and Throat Lips, teeth, and gums WNL.[name] Kitchen Moist mucosa without lesions. Neck supple and nontender. No palpable supraclavicular or cervical adenopathy. Normal sized without goiter. Respiratory WNL. No retractions.. Cardiovascular Pedal Pulses WNL. No clubbing, cyanosis or edema. Lymphatic No adneopathy. No adenopathy. No adenopathy. Musculoskeletal Adexa without tenderness or enlargement.. Digits and nails w/o clubbing, cyanosis, infection, petechiae, ischemia, or inflammatory conditions.[name] Kitchen Psychiatric Judgement and  insight Intact.. No evidence of depression, anxiety, or agitation.. General Notes: the wound is about the same size and has some debris at the edges and the base and are sharply to remove this with a curet but his pain is way out of proportion and I could not complete the job. No bleeding. Integumentary (Hair, Skin) No suspicious lesions. No crepitus or fluctuance. No peri-wound warmth or erythema. No masses.. Wound #1 status is Open. Original cause of wound was Gradually Appeared. The wound is located on the Right,Lateral Lower Leg. The wound measures 1.5cm length x 1.5cm width x 0.4cm depth; 1.767cm^2 area and 0.707cm^3 volume. The wound is limited to skin breakdown. There is no tunneling or undermining noted. There is a large amount of serosanguineous drainage noted. The wound margin is distinct with the outline attached to the wound base. There is small (1-33%) pink granulation within the wound bed. There is a large (67-100%) amount of necrotic tissue within the wound bed including Adherent Slough. The periwound skin appearance exhibited: Localized Edema, Dry/Scaly, Moist. The periwound skin appearance did not exhibit: Callus, Crepitus, Excoriation, Fluctuance, Friable, Induration, Rash, Scarring, Maceration, Atrophie Blanche, Cyanosis, Ecchymosis, Hemosiderin  Staining, Mottled, Pallor, Rubor, Erythema. Periwound temperature was noted as No Abnormality. The periwound has tenderness on palpation. MALIKHI, COATES ([id number]) Assessment Active Problems ICD-10 E11.622 - Type 2 diabetes mellitus with other skin ulcer L97.212 - Non-pressure chronic ulcer of right calf with fat layer exposed L40.9 - Psoriasis, unspecified E66.01 - Morbid (severe) obesity due to excess calories Procedures Wound #1 Wound #1 is a Diabetic Wound/Ulcer of the Lower Extremity located on the Right,Lateral Lower Leg . There was a Skin/Subcutaneous Tissue Debridement [id number]) debridement with total area of  2.25 sq cm performed by Christin Fudge, MD. with the following instrument(s): Curette to remove Non-Viable tissue/material including Fibrin/Slough and Subcutaneous after achieving pain control using Lidocaine 4% Topical Solution. A time out was conducted prior to the start of the procedure. A Minimum amount of bleeding was controlled with Pressure. The procedure was tolerated well with a pain level of 4 throughout and a pain level of 4 following the procedure. Post procedure Diagnosis Wound #1: Same as Pre-Procedure Plan Wound Cleansing: Wound #1 Right,Lateral Lower Leg: Cleanse wound with mild soap and water Primary Wound Dressing: Wound #1 Right,Lateral Lower Leg: Santyl Ointment Secondary Dressing: Wound #1 Right,Lateral Lower Leg: Gauze and Kerlix/Conform Dressing Change Frequency: Wound #1 Right,Lateral Lower Leg: Change dressing every day. Follow-up Appointments: HOOPER, GRIESEL ([id number]) Wound #1 Right,Lateral Lower Leg: Return Appointment in 1 week. Additional Orders / Instructions: Wound #1 Right,Lateral Lower Leg: Increase protein intake. Activity as tolerated Medications-please add to medication list.: Wound #1 Right,Lateral Lower Leg: Santyl Enzymatic Ointment I have recommended: 1. Washing this area with soap and water daily and scrubbing the area well 2. Santyl ointment locally to be changed daily 3. Regular visits to the wound center Electronic Signature(s) Signed: 07/05/2016 11:15:22 AM By: Christin Fudge MD, FACS Entered By: Christin Fudge on 07/05/2016 11:15:22 Marty, Aquan [name] -------------------------------------------------------------------------------- SuperBill Details Aure, Kobi Date of Service: 07/05/2016 Patient Name: [name] Patient Account Number: [id number] Medical Record [name], RN, BSN, [id number] Treating RN: Number: Velva Harman Date of Birth/Sex: [birth year] (50 y.o. 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 L40.9 Psoriasis, unspecified E66.01 Morbid (severe) obesity due to excess calories Facility Procedures CPT4 Code: [id number] Description: [id number] - DEB SUBQ TISSUE 20 SQ CM/< ICD-10 Description Diagnosis E11.622 Type 2 diabetes mellitus with other skin ulcer L97.212 Non-pressure chronic ulcer of right calf with fat E66.01 Morbid (severe) obesity due to excess calories L40.9  Psoriasis, unspecified Modifier: layer exposed Quantity: 1 Physician Procedures CPT4 Code: [id number] Description: [id number] - WC PHYS SUBQ TISS 20 SQ CM ICD-10 Description Diagnosis E11.622 Type 2 diabetes mellitus with other skin ulcer L97.212 Non-pressure chronic ulcer of right calf with fat E66.01 Morbid (severe) obesity due to excess calories L40.9  Psoriasis, unspecified Modifier: layer exposed Quantity: 1 Electronic Signature(s) Signed: 07/05/2016 11:16:40 AM By: Christin Fudge MD, FACS Entered By: Christin Fudge on 07/05/2016 11:16:39

## 2016-07-06 NOTE — Progress Notes (Signed)
KHARTER, REXROTH ([id number]) Visit Report for 07/05/2016 Arrival Information Details Patient Name: [name], [name] Date of Service: 07/05/2016 10:00 AM Medical Record Number: [id number] Patient Account Number: [id number] Date of Birth/Sex: September 24, 1966 (50 y.o. Male) Treating RN: [name], RN, BSN, Velva Harman Primary Care Physician: SYSTEM, PCP Other Clinician: Referring Physician: Lisa Roca Treating Physician/Extender: Frann Rider in Treatment: 2 Visit Information History Since Last Visit Added or deleted any medications: No Patient Arrived: Ambulatory Any new allergies or adverse reactions: No Arrival Time: 10:15 Had a fall or experienced change in No Accompanied By: self activities of daily living that may affect Transfer Assistance: None risk of falls: Patient Requires Transmission- No Signs or symptoms of abuse/neglect since last No Based Precautions: visito Patient Has Alerts: Yes Hospitalized since last visit: No Patient Alerts: Patient on Blood Has Dressing in Place as Prescribed: Yes Thinner Pain Present Now: No ASA DM2 A1c 9 Electronic Signature(s) Signed: 07/05/2016 3:37:03 PM By: Regan Lemming BSN, RN Entered By: Regan Lemming on 07/05/2016 10:16:18 Chea, Jayleen Jerilynn Mages ([id number]) -------------------------------------------------------------------------------- Encounter Discharge Information Details Patient Name: [name] Date of Service: 07/05/2016 10:00 AM Medical Record Number: [id number] Patient Account Number: [id number] Date of Birth/Sex: Aug 24, 1966 (50 y.o. Male) Treating RN: [name], RN, BSN, Velva Harman Primary Care Physician: SYSTEM, PCP Other Clinician: Referring Physician: Lisa Roca Treating Physician/Extender: Frann Rider in Treatment: 2 Encounter Discharge Information Items Discharge Pain Level: 0 Discharge Condition: Stable Ambulatory Status: Ambulatory Discharge Destination: Home Transportation: Private  Auto Accompanied By: self Schedule Follow-up Appointment: No Medication Reconciliation completed and provided to Patient/Care No Provider: Provided on Clinical Summary of Care: 07/05/2016 Form Type Recipient Paper Patient JT Electronic Signature(s) Signed: 07/05/2016 3:37:03 PM By: Regan Lemming BSN, RN Previous Signature: 07/05/2016 10:41:51 AM Version By: Ruthine Dose Entered By: Regan Lemming on 07/05/2016 10:43:21 Gelin, Teller M. ([id number]) -------------------------------------------------------------------------------- Lower Extremity Assessment Details Patient Name: [name] Date of Service: 07/05/2016 10:00 AM Medical Record Number: [id number] Patient Account Number: [id number] Date of Birth/Sex: 1966/03/08 (50 y.o. Male) Treating RN: [name], RN, BSN, [place] Primary Care Physician: SYSTEM, PCP Other Clinician: Referring Physician: Lisa Roca Treating Physician/Extender: Frann Rider in Treatment: 2 Vascular Assessment Pulses: Posterior Tibial Dorsalis Pedis Palpable: [Right:Yes] Extremity colors, hair growth, and conditions: Extremity Color: [Right:Mottled] Hair Growth on Extremity: [Right:Yes] Temperature of Extremity: [Right:Warm] Capillary Refill: [Right:< 3 seconds] Toe Nail Assessment Left: Right: Thick: No Discolored: No Deformed: No Improper Length and Hygiene: No Electronic Signature(s) Signed: 07/05/2016 3:37:03 PM By: Regan Lemming BSN, RN Entered By: Regan Lemming on 07/05/2016 10:16:44 Chevez, Dakarai M. ([id number]) -------------------------------------------------------------------------------- Multi Wound Chart Details Patient Name: [name] Date of Service: 07/05/2016 10:00 AM Medical Record Number: [id number] Patient Account Number: [id number] Date of Birth/Sex: [birth year] (50 y.o. Male) Treating RN: [name], RN, BSN, [place] Primary Care Physician: SYSTEM, PCP Other Clinician: Referring Physician: Lisa Roca Treating Physician/Extender: Frann Rider in Treatment: 2 Vital Signs Height(in): 64 Pulse(bpm): 76 Weight(lbs): 274 Blood Pressure 119/57 (mmHg): Body Mass Index(BMI): 47 Temperature(F): 98.4 Respiratory Rate 20 (breaths/min): Photos: [1:No Photos] [N/A:N/A] Wound Location: [1:Right Lower Leg - Lateral] [N/A:N/A] Wounding Event: [1:Gradually Appeared] [N/A:N/A] Primary Etiology: [1:Diabetic Wound/Ulcer of the Lower Extremity] [N/A:N/A] Comorbid History: [1:Anemia, Sleep Apnea, Arrhythmia, Congestive Heart Failure, Hypertension, Peripheral Venous Disease, Type II Diabetes, Gout, Neuropathy] [N/A:N/A] Date Acquired: [1:04/23/2016] [N/A:N/A] Weeks of Treatment: [1:2] [N/A:N/A] Wound Status: [1:Open] [N/A:N/A] Measurements L x W x D 1.5x1.5x0.4 [N/A:N/A] (cm) Area (cm) : [1:1.767] [N/A:N/A] Volume (cm) : [1:0.707] [N/A:N/A] % Reduction in  Area: [1:0.00%] [N/A:N/A] % Reduction in Volume: 20.00% [N/A:N/A] Classification: [1:Grade 1] [N/A:N/A] Exudate Amount: [1:Large] [N/A:N/A] Exudate Type: [1:Serosanguineous] [N/A:N/A] Exudate Color: [1:red, brown] [N/A:N/A] Wound Margin: [1:Distinct, outline attached] [N/A:N/A] Granulation Amount: [1:Small (1-33%)] [N/A:N/A] Granulation Quality: [1:Pink] [N/A:N/A] Necrotic Amount: [1:Large (67-100%)] [N/A:N/A] Exposed Structures: [N/A:N/A] Fascia: No Fat: No Tendon: No Muscle: No Joint: No Bone: No Limited to Skin Breakdown Epithelialization: None N/A N/A Periwound Skin Texture: Edema: Yes N/A N/A Excoriation: No Induration: No Callus: No Crepitus: No Fluctuance: No Friable: No Rash: No Scarring: No Periwound Skin Moist: Yes N/A N/A Moisture: Dry/Scaly: Yes Maceration: No Periwound Skin Color: Atrophie Blanche: No N/A N/A Cyanosis: No Ecchymosis: No Erythema: No Hemosiderin Staining: No Mottled: No Pallor: No Rubor: No Temperature: No Abnormality N/A N/A Tenderness on Yes N/A N/A Palpation: Wound  Preparation: Ulcer Cleansing: N/A N/A Rinsed/Irrigated with Saline Topical Anesthetic Applied: Other: lidocaine 4 % Treatment Notes Electronic Signature(s) Signed: 07/05/2016 3:37:03 PM By: Regan Lemming BSN, RN Entered By: Regan Lemming on 07/05/2016 10:30:50 Vonada, Ausar Jerilynn Mages ([id number]) -------------------------------------------------------------------------------- Multi-Disciplinary Care Plan Details Patient Name: [name] Date of Service: 07/05/2016 10:00 AM Medical Record Number: [id number] Patient Account Number: [id number] Date of Birth/Sex: Nov 14, 1966 (50 y.o. Male) Treating RN: [name], RN, BSN, Velva Harman Primary Care Physician: SYSTEM, PCP Other Clinician: Referring Physician: Lisa Roca Treating Physician/Extender: Frann Rider in Treatment: 2 Active Inactive Orientation to the Wound Care Program Nursing Diagnoses: Knowledge deficit related to the wound healing center program Goals: Patient/caregiver will verbalize understanding of the [facility] Date Initiated: 06/18/2016 Goal Status: Active Interventions: Provide education on orientation to the wound center Notes: Venous Leg Ulcer Nursing Diagnoses: Knowledge deficit related to disease process and management Potential for venous Insuffiency (use before diagnosis confirmed) Goals: Patient will maintain optimal edema control Date Initiated: 06/18/2016 Goal Status: Active Patient/caregiver will verbalize understanding of disease process and disease management Date Initiated: 06/18/2016 Goal Status: Active Verify adequate tissue perfusion prior to therapeutic compression application Date Initiated: 06/18/2016 Goal Status: Active Interventions: Compression as ordered Provide education on venous insufficiency Notes: HEROLD, SLATES ([id number]) Wound/Skin Impairment Nursing Diagnoses: Impaired tissue integrity Goals: Patient/caregiver will verbalize understanding of skin care  regimen Date Initiated: 06/18/2016 Goal Status: Active Ulcer/skin breakdown will have a volume reduction of 30% by week 4 Date Initiated: 06/18/2016 Goal Status: Active Ulcer/skin breakdown will have a volume reduction of 50% by week 8 Date Initiated: 06/18/2016 Goal Status: Active Ulcer/skin breakdown will have a volume reduction of 80% by week 12 Date Initiated: 06/18/2016 Goal Status: Active Ulcer/skin breakdown will heal within 14 weeks Date Initiated: 06/18/2016 Goal Status: Active Interventions: Assess patient/caregiver ability to obtain necessary supplies Assess patient/caregiver ability to perform ulcer/skin care regimen upon admission and as needed Assess ulceration(s) every visit Provide education on ulcer and skin care Treatment Activities: Skin care regimen initiated : 06/18/2016 Topical wound management initiated : 06/18/2016 Notes: Electronic Signature(s) Signed: 07/05/2016 3:37:03 PM By: Regan Lemming BSN, RN Entered By: Regan Lemming on 07/05/2016 10:30:40 Hust, Landin Jerilynn Mages ([id number]) -------------------------------------------------------------------------------- Pain Assessment Details Patient Name: [name] Date of Service: 07/05/2016 10:00 AM Medical Record Number: [id number] Patient Account Number: [id number] Date of Birth/Sex: [birth year] (50 y.o. Male) Treating RN: [name], RN, BSN, [place] Primary Care Physician: SYSTEM, PCP Other Clinician: Referring Physician: Lisa Roca Treating Physician/Extender: Frann Rider in Treatment: 2 Active Problems Location of Pain Severity and Description of Pain Patient Has Paino No Site Locations With Dressing Change: No Pain Management and Medication Current Pain Management:  Electronic Signature(s) Signed: 07/05/2016 3:37:03 PM By: Regan Lemming BSN, RN Entered By: Regan Lemming on 07/05/2016 10:18:59 Spates, Coston Jerilynn Mages  ([id number]) -------------------------------------------------------------------------------- Patient/Caregiver Education Details Patient Name: [name] Date of Service: 07/05/2016 10:00 AM Medical Record Number: [id number] Patient Account Number: [id number] Date of Birth/Gender: [birth year] (50 y.o. Male) Treating RN: [name], RN, BSN, [place] Primary Care Physician: SYSTEM, PCP Other Clinician: Referring Physician: Lisa Roca Treating Physician/Extender: Frann Rider in Treatment: 2 Education Assessment Education Provided To: Patient Education Topics Provided Venous: Methods: Explain/Verbal Responses: State content correctly Welcome To [facility]: Methods: Explain/Verbal Responses: State content correctly Wound/Skin Impairment: Methods: Explain/Verbal Responses: State content correctly Electronic Signature(s) Signed: 07/05/2016 3:37:03 PM By: Regan Lemming BSN, RN Entered By: Regan Lemming on 07/05/2016 10:43:36 Nwosu, Vinh M. ([id number]) -------------------------------------------------------------------------------- Wound Assessment Details Patient Name: [name] Date of Service: 07/05/2016 10:00 AM Medical Record Number: [id number] Patient Account Number: [id number] Date of Birth/Sex: 21-Mar-1966 (50 y.o. Male) Treating RN: [name], RN, BSN, [place] Primary Care Physician: SYSTEM, PCP Other Clinician: Referring Physician: Lisa Roca Treating Physician/Extender: Frann Rider in Treatment: 2 Wound Status Wound Number: 1 Primary Diabetic Wound/Ulcer of the Lower Etiology: Extremity Wound Location: Right Lower Leg - Lateral Wound Open Wounding Event: Gradually Appeared Status: Date Acquired: 04/23/2016 Comorbid Anemia, Sleep Apnea, Arrhythmia, Weeks Of Treatment: 2 History: Congestive Heart Failure, Hypertension, Clustered Wound: No Peripheral Venous Disease, Type II Diabetes, Gout, Neuropathy Photos Photo Uploaded By:  Regan Lemming on 07/05/2016 17:18:23 Wound Measurements Length: (cm) 1.5 % Reduction in Width: (cm) 1.5 % Reduction in Depth: (cm) 0.4 Epithelializat Area: (cm) 1.767 Tunneling: Volume: (cm) 0.707 Undermining: Area: 0% Volume: 20% ion: None No No Wound Description Classification: Grade 1 Foul Odor Aft Wound Margin: Distinct, outline attached Exudate Amount: Large Kriegel, Soloman M. ([id number]) er Cleansing: No Exudate Type: Serosanguineous Exudate Color: red, brown Wound Bed Granulation Amount: Small (1-33%) Exposed Structure Granulation Quality: Pink Fascia Exposed: No Necrotic Amount: Large (67-100%) Fat Layer Exposed: No Necrotic Quality: Adherent Slough Tendon Exposed: No Muscle Exposed: No Joint Exposed: No Bone Exposed: No Limited to Skin Breakdown Periwound Skin Texture Texture Color No Abnormalities Noted: No No Abnormalities Noted: No Callus: No Atrophie Blanche: No Crepitus: No Cyanosis: No Excoriation: No Ecchymosis: No Fluctuance: No Erythema: No Friable: No Hemosiderin Staining: No Induration: No Mottled: No Localized Edema: Yes Pallor: No Rash: No Rubor: No Scarring: No Temperature / Pain Moisture Temperature: No Abnormality No Abnormalities Noted: No Tenderness on Palpation: Yes Dry / Scaly: Yes Maceration: No Moist: Yes Wound Preparation Ulcer Cleansing: Rinsed/Irrigated with Saline Topical Anesthetic Applied: Other: lidocaine 4 %, Treatment Notes Wound #1 (Right, Lateral Lower Leg) 1. Cleansed with: Clean wound with Normal Saline 4. Dressing Applied: Santyl Ointment 5. Secondary Dressing Applied Gauze and Kerlix/Conform 7. Secured with [employer]) FORDYCE, ELM ([id number]) Signed: 07/05/2016 3:37:03 PM By: Regan Lemming BSN, RN Entered By: Regan Lemming on 07/05/2016 10:30:34 OSAGIE, DOSKOCIL ([id number]) -------------------------------------------------------------------------------- Vitals  Details Patient Name: [name] Date of Service: 07/05/2016 10:00 AM Medical Record Number: [id number] Patient Account Number: [id number] Date of Birth/Sex: [birth year] (50 y.o. Male) Treating RN: [name], RN, BSN, [place] Primary Care Physician: SYSTEM, PCP Other Clinician: Referring Physician: Lisa Roca Treating Physician/Extender: Frann Rider in Treatment: 2 Vital Signs Time Taken: 10:18 Temperature (F): 98.4 Height (in): 64 Pulse (bpm): 76 Weight (lbs): 274 Respiratory Rate (breaths/min): 20 Body Mass Index (BMI): 47 Blood Pressure (mmHg): 119/57 Reference Range: 80 - 120 mg / dl  Electronic Signature(s) Signed: 07/05/2016 3:37:03 PM By: Regan Lemming BSN, RN Entered By: Regan Lemming on 07/05/2016 10:19:36

## 2016-07-13 ENCOUNTER — Encounter: Payer: BLUE CROSS/BLUE SHIELD | Admitting: Surgery

## 2016-07-13 DIAGNOSIS — E11622 Type 2 diabetes mellitus with other skin ulcer: Secondary | ICD-10-CM | POA: Diagnosis not present

## 2016-07-14 NOTE — Progress Notes (Signed)
[name] ([id number]) Visit Report for 07/13/2016 Arrival Information Details Patient Name: [name], [name] Date of Service: 07/13/2016 10:00 AM Medical Record Number: [id number] Patient Account Number: [id number] Date of Birth/Sex: Apr 22, 1966 (50 y.o. Male) Treating RN: Cornell Barman Primary Care Physician: SYSTEM, PCP Other Clinician: Referring Physician: Lisa Roca Treating Physician/Extender: Frann Rider in Treatment: 3 Visit Information History Since Last Visit Added or deleted any medications: No Patient Arrived: Ambulatory Any new allergies or adverse reactions: No Arrival Time: 09:55 Had a fall or experienced change in No Accompanied By: self activities of daily living that may affect Transfer Assistance: None risk of falls: Patient Identification Verified: Yes Signs or symptoms of abuse/neglect since last No Secondary Verification Process Yes visito Completed: Hospitalized since last visit: No Patient Requires Transmission- No Has Dressing in Place as Prescribed: Yes Based Precautions: Pain Present Now: No Patient Has Alerts: Yes Patient Alerts: Patient on Blood Thinner ASA DM2 A1c 9 Electronic Signature(s) Signed: 07/13/2016 4:38:27 PM By: Gretta Cool, RN, BSN, Kim RN, BSN Entered By: Gretta Cool, RN, BSN, Kim on 07/13/2016 10:11:11 [name], [name] ([id number]) -------------------------------------------------------------------------------- Encounter Discharge Information Details Patient Name: [name] Date of Service: 07/13/2016 10:00 AM Medical Record Number: [id number] Patient Account Number: [id number] Date of Birth/Sex: 1966/06/07 (50 y.o. Male) Treating RN: Cornell Barman Primary Care Physician: SYSTEM, PCP Other Clinician: Referring Physician: Lisa Roca Treating Physician/Extender: Frann Rider in Treatment: 3 Encounter Discharge Information Items Discharge Pain Level: 0 Discharge Condition: Stable Ambulatory Status:  Ambulatory Discharge Destination: Home Transportation: Private Auto Accompanied By: self Schedule Follow-up Appointment: Yes Medication Reconciliation completed and provided to Patient/Care Yes Provider: Provided on Clinical Summary of Care: 07/13/2016 Form Type Recipient Paper Patient JT Electronic Signature(s) Signed: 07/13/2016 10:40:26 AM By: Ruthine Dose Entered By: Ruthine Dose on 07/13/2016 10:40:26 Scrivener, Olin M. ([id number]) -------------------------------------------------------------------------------- Lower Extremity Assessment Details Patient Name: [name] Date of Service: 07/13/2016 10:00 AM Medical Record Number: [id number] Patient Account Number: [id number] Date of Birth/Sex: 14-Mar-1966 (50 y.o. Male) Treating RN: Cornell Barman Primary Care Physician: SYSTEM, PCP Other Clinician: Referring Physician: Lisa Roca Treating Physician/Extender: Frann Rider in Treatment: 3 Vascular Assessment Pulses: Posterior Tibial Palpable: [Right:Yes] Dorsalis Pedis Palpable: [Right:Yes] Extremity colors, hair growth, and conditions: Extremity Color: [Right:Hyperpigmented] Hair Growth on Extremity: [Right:No] Temperature of Extremity: [Right:Warm] Capillary Refill: [Right:< 3 seconds] Dependent Rubor: [Right:No] Blanched when Elevated: [Right:No] Toe Nail Assessment Left: Right: Thick: Yes Discolored: Yes Deformed: Yes Improper Length and Hygiene: Yes Electronic Signature(s) Signed: 07/13/2016 4:38:27 PM By: Gretta Cool, RN, BSN, Kim RN, BSN Entered By: Gretta Cool, RN, BSN, Kim on 07/13/2016 10:15:53 [name], [name] ([id number]) -------------------------------------------------------------------------------- Multi Wound Chart Details Patient Name: [name] Date of Service: 07/13/2016 10:00 AM Medical Record Number: [id number] Patient Account Number: [id number] Date of Birth/Sex: October 24, 1966 (50 y.o. Male) Treating RN: Cornell Barman Primary  Care Physician: SYSTEM, PCP Other Clinician: Referring Physician: Lisa Roca Treating Physician/Extender: Frann Rider in Treatment: 3 Vital Signs Height(in): 64 Pulse(bpm): 83 Weight(lbs): 274 Blood Pressure 142/83 (mmHg): Body Mass Index(BMI): 47 Temperature(F): 98.0 Respiratory Rate 20 (breaths/min): Photos: [N/A:N/A] Wound Location: Right Lower Leg - Lateral N/A N/A Wounding Event: Gradually Appeared N/A N/A Primary Etiology: Diabetic Wound/Ulcer of N/A N/A the Lower Extremity Comorbid History: Anemia, Sleep Apnea, N/A N/A Arrhythmia, Congestive Heart Failure, Hypertension, Peripheral Venous Disease, Type II Diabetes, Gout, Neuropathy Date Acquired: 04/23/2016 N/A N/A Weeks of Treatment: 3 N/A N/A Wound Status: Open N/A N/A Measurements L x W x D 1.5x1.2x0.4 N/A N/A (cm)  Area (cm) : 1.414 N/A N/A Volume (cm) : 0.565 N/A N/A % Reduction in Area: 20.00% N/A N/A % Reduction in Volume: 36.10% N/A N/A Classification: Grade 1 N/A N/A Exudate Amount: Large N/A N/A Exudate Type: Serosanguineous N/A N/A [name], [name] ([id number]) Exudate Color: red, brown N/A N/A Wound Margin: Distinct, outline attached N/A N/A Granulation Amount: Small (1-33%) N/A N/A Granulation Quality: Pink N/A N/A Necrotic Amount: Large (67-100%) N/A N/A Exposed Structures: Fascia: No N/A N/A Fat: No Tendon: No Muscle: No Joint: No Bone: No Limited to Skin Breakdown Epithelialization: None N/A N/A Periwound Skin Texture: Edema: Yes N/A N/A Excoriation: No Induration: No Callus: No Crepitus: No Fluctuance: No Friable: No Rash: No Scarring: No Periwound Skin Moist: Yes N/A N/A Moisture: Maceration: No Dry/Scaly: No Periwound Skin Color: Atrophie Blanche: No N/A N/A Cyanosis: No Ecchymosis: No Erythema: No Hemosiderin Staining: No Mottled: No Pallor: No Rubor: No Temperature: No Abnormality N/A N/A Tenderness on Yes N/A N/A Palpation: Wound  Preparation: Ulcer Cleansing: N/A N/A Rinsed/Irrigated with Saline Topical Anesthetic Applied: Other: lidocaine 4 % Treatment Notes Electronic Signature(s) Signed: 07/13/2016 4:38:27 PM By: Gretta Cool, RN, BSN, Kim RN, BSN [name], [name] ([id number]) Entered By: Gretta Cool, RN, BSN, Kim on 07/13/2016 10:23:29 [name], [name] ([id number]) -------------------------------------------------------------------------------- Multi-Disciplinary Care Plan Details Patient Name: [name] Date of Service: 07/13/2016 10:00 AM Medical Record Number: [id number] Patient Account Number: [id number] Date of Birth/Sex: Jun 12, 1966 (50 y.o. Male) Treating RN: Cornell Barman Primary Care Physician: SYSTEM, PCP Other Clinician: Referring Physician: Lisa Roca Treating Physician/Extender: Frann Rider in Treatment: 3 Active Inactive Orientation to the Wound Care Program Nursing Diagnoses: Knowledge deficit related to the wound healing center program Goals: Patient/caregiver will verbalize understanding of the [facility] Program Date Initiated: 06/18/2016 Goal Status: Active Interventions: Provide education on orientation to the wound center Notes: Venous Leg Ulcer Nursing Diagnoses: Knowledge deficit related to disease process and management Potential for venous Insuffiency (use before diagnosis confirmed) Goals: Patient will maintain optimal edema control Date Initiated: 06/18/2016 Goal Status: Active Patient/caregiver will verbalize understanding of disease process and disease management Date Initiated: 06/18/2016 Goal Status: Active Verify adequate tissue perfusion prior to therapeutic compression application Date Initiated: 06/18/2016 Goal Status: Active Interventions: Compression as ordered Provide education on venous insufficiency Notes: DEMONE, GAVIDIA ([id number]) Wound/Skin Impairment Nursing Diagnoses: Impaired tissue integrity Goals: Patient/caregiver  will verbalize understanding of skin care regimen Date Initiated: 06/18/2016 Goal Status: Active Ulcer/skin breakdown will have a volume reduction of 30% by week 4 Date Initiated: 06/18/2016 Goal Status: Active Ulcer/skin breakdown will have a volume reduction of 50% by week 8 Date Initiated: 06/18/2016 Goal Status: Active Ulcer/skin breakdown will have a volume reduction of 80% by week 12 Date Initiated: 06/18/2016 Goal Status: Active Ulcer/skin breakdown will heal within 14 weeks Date Initiated: 06/18/2016 Goal Status: Active Interventions: Assess patient/caregiver ability to obtain necessary supplies Assess patient/caregiver ability to perform ulcer/skin care regimen upon admission and as needed Assess ulceration(s) every visit Provide education on ulcer and skin care Treatment Activities: Skin care regimen initiated : 06/18/2016 Topical wound management initiated : 06/18/2016 Notes: Electronic Signature(s) Signed: 07/13/2016 4:38:27 PM By: Gretta Cool, RN, BSN, Kim RN, BSN Entered By: Gretta Cool, RN, BSN, Kim on 07/13/2016 10:20:59 [name], [name] ([id number]) -------------------------------------------------------------------------------- Pain Assessment Details Patient Name: [name] Date of Service: 07/13/2016 10:00 AM Medical Record Number: [id number] Patient Account Number: [id number] Date of Birth/Sex: October 05, 1966 (50 y.o. Male) Treating RN: Cornell Barman Primary Care Physician: SYSTEM, PCP Other Clinician: Referring  Physician: Lisa Roca Treating Physician/Extender: Frann Rider in Treatment: 3 Active Problems Location of Pain Severity and Description of Pain Patient Has Paino Yes Site Locations Pain Location: Pain in Ulcers With Dressing Change: No Duration of the Pain. Constant / Intermittento Constant Pain Management and Medication Current Pain Management: Goals for Pain Management Topical or injectable lidocaine is offered to patient for acute pain when  surgical debridement is performed. If needed, Patient is instructed to use over the counter pain medication for the following 24-48 hours after debridement. Wound care MDs do not prescribed pain medications. Patient has chronic pain or uncontrolled pain. Patient has been instructed to make an appointment with their Primary Care Physician for pain management. Electronic Signature(s) Signed: 07/13/2016 4:38:27 PM By: Gretta Cool, RN, BSN, Kim RN, BSN Entered By: Gretta Cool, RN, BSN, Kim on 07/13/2016 10:12:08 [name] ([id number]) -------------------------------------------------------------------------------- Patient/Caregiver Education Details Patient Name: [name] Date of Service: 07/13/2016 10:00 AM Medical Record Number: [id number] Patient Account Number: [id number] Date of Birth/Gender: 30-Mar-1966 (50 y.o. Male) Treating RN: Cornell Barman Primary Care Physician: SYSTEM, PCP Other Clinician: Referring Physician: Lisa Roca Treating Physician/Extender: Frann Rider in Treatment: 3 Education Assessment Education Provided To: Patient Education Topics Provided Venous: Handouts: Controlling Swelling with Compression Stockings Methods: Demonstration, Explain/Verbal Responses: State content correctly Electronic Signature(s) Signed: 07/13/2016 4:38:27 PM By: Gretta Cool, RN, BSN, Kim RN, BSN Entered By: Gretta Cool, RN, BSN, Kim on 07/13/2016 10:35:22 [name], [name] ([id number]) -------------------------------------------------------------------------------- Wound Assessment Details Patient Name: [name] Date of Service: 07/13/2016 10:00 AM Medical Record Number: [id number] Patient Account Number: [id number] Date of Birth/Sex: [birth year] (50 y.o. Male) Treating RN: Cornell Barman Primary Care Physician: SYSTEM, PCP Other Clinician: Referring Physician: Lisa Roca Treating Physician/Extender: Frann Rider in Treatment: 3 Wound Status Wound Number: 1  Primary Diabetic Wound/Ulcer of the Lower Etiology: Extremity Wound Location: Right Lower Leg - Lateral Wound Open Wounding Event: Gradually Appeared Status: Date Acquired: 04/23/2016 Comorbid Anemia, Sleep Apnea, Arrhythmia, Weeks Of Treatment: 3 History: Congestive Heart Failure, Hypertension, Clustered Wound: No Peripheral Venous Disease, Type II Diabetes, Gout, Neuropathy Photos Wound Measurements Length: (cm) 1.5 Width: (cm) 1.2 Depth: (cm) 0.4 Area: (cm) 1.414 Volume: (cm) 0.565 % Reduction in Area: 20% % Reduction in Volume: 36.1% Epithelialization: None Tunneling: No Undermining: No Wound Description Classification: Grade 1 Foul Odor Aft Wound Margin: Distinct, outline attached Exudate Amount: Large Exudate Type: Serosanguineous Exudate Color: red, brown er Cleansing: No Wound Bed Granulation Amount: Small (1-33%) Exposed Structure Granulation Quality: Pink Fascia Exposed: No Necrotic Amount: Large (67-100%) Fat Layer Exposed: No Necrotic Quality: Adherent Slough Tendon Exposed: No [name], [name] ([id number]) Muscle Exposed: No Joint Exposed: No Bone Exposed: No Limited to Skin Breakdown Periwound Skin Texture Texture Color No Abnormalities Noted: No No Abnormalities Noted: No Callus: No Atrophie Blanche: No Crepitus: No Cyanosis: No Excoriation: No Ecchymosis: No Fluctuance: No Erythema: No Friable: No Hemosiderin Staining: No Induration: No Mottled: No Localized Edema: Yes Pallor: No Rash: No Rubor: No Scarring: No Temperature / Pain Moisture Temperature: No Abnormality No Abnormalities Noted: No Tenderness on Palpation: Yes Dry / Scaly: No Maceration: No Moist: Yes Wound Preparation Ulcer Cleansing: Rinsed/Irrigated with Saline Topical Anesthetic Applied: Other: lidocaine 4 %, Treatment Notes Wound #1 (Right, Lateral Lower Leg) 1. Cleansed with: Clean wound with Normal Saline 2. Anesthetic Topical Lidocaine 4% cream to  wound bed prior to debridement 4. Dressing Applied: Santyl Ointment 5. Secondary Dressing Applied Non-Adherent pad 7. Secured with Other (specify in notes) Notes  conform Electronic Signature(s) Signed: 07/13/2016 4:38:27 PM By: Gretta Cool, RN, BSN, Kim RN, BSN Entered By: Gretta Cool, RN, BSN, Kim on 07/13/2016 10:33:36 [name], [name] ([id number]) WNOROWSKI, [place] Jerilynn Brown ([id number]) -------------------------------------------------------------------------------- [place] Details Patient Name: [name] Date of Service: 07/13/2016 10:00 AM Medical Record Number: [id number] Patient Account Number: [id number] Date of Birth/Sex: 1966-03-09 (50 y.o. Male) Treating RN: Cornell Barman Primary Care Physician: SYSTEM, PCP Other Clinician: Referring Physician: Lisa Roca Treating Physician/Extender: Frann Rider in Treatment: 3 Vital Signs Time Taken: 10:12 Temperature (F): 98.0 Height (in): 64 Pulse (bpm): 83 Weight (lbs): 274 Respiratory Rate (breaths/min): 20 Body Mass Index (BMI): 47 Blood Pressure (mmHg): 142/83 Reference Range: 80 - 120 mg / dl Electronic Signature(s) Signed: 07/13/2016 4:38:27 PM By: Gretta Cool, RN, BSN, Kim RN, BSN Entered By: Gretta Cool, RN, BSN, Kim on 07/13/2016 10:12:36

## 2016-07-14 NOTE — Progress Notes (Signed)
[name], [name] ([id number]) Visit Report for 07/13/2016 Chief Complaint Document Details [name], [name] 07/13/2016 10:00 Patient Name: Date of Service: M. AM Medical Record Patient Account Number: [id number] Number: Treating RN: Cornell Barman Date of Birth/Sex: 29-Apr-1966 (50 y.o. Male) Other Clinician: Primary Care Physician: SYSTEM, PCP Treating Christin Fudge Referring Physician: Lisa Roca Physician/Extender: Suella Grove in Treatment: 3 Information Obtained from: Patient Chief Complaint Patients presents for treatment of an open diabetic ulcer to the right lower extremity near his car for about 3 months Electronic Signature(s) Signed: 07/13/2016 10:50:03 AM By: Christin Fudge MD, FACS Entered By: Christin Fudge on 07/13/2016 10:50:03 [name], [name] ([id number]) -------------------------------------------------------------------------------- Debridement Details Tangonan, Tarique 07/13/2016 10:00 Patient Name: Date of Service: M. AM Medical Record Patient Account Number: [id number] Number: Treating RN: Cornell Barman Date of Birth/Sex: 13-May-1966 (50 y.o. Male) Other Clinician: Primary Care Physician: SYSTEM, PCP Treating Britto, Errol Referring Physician: Lisa Roca Physician/Extender: Suella Grove in Treatment: 3 Debridement Performed for Wound #1 Right,Lateral Lower Leg Assessment: Performed By: Physician Christin Fudge, MD Debridement: Debridement Pre-procedure Yes Verification/Time Out Taken: Start Time: 10:25 Pain Control: Other : lidocaine 4% Level: Skin/Subcutaneous Tissue Total Area Debrided (L x 1.5 (cm) x 1.2 (cm) = 1.8 (cm) W): Tissue and other Viable, Non-Viable, Exudate, Fibrin/Slough, Subcutaneous material debrided: Instrument: Curette Bleeding: Minimum Hemostasis Achieved: Pressure End Time: 10:29 Procedural Pain: 2 Post Procedural Pain: 2 Response to Treatment: Procedure was tolerated well Post Debridement Measurements of  Total Wound Length: (cm) 1.5 Width: (cm) 1.2 Depth: (cm) 0.4 Volume: (cm) 0.565 Post Procedure Diagnosis Same as Pre-procedure Electronic Signature(s) Signed: 07/13/2016 10:49:57 AM By: Christin Fudge MD, FACS Signed: 07/13/2016 4:38:27 PM By: Gretta Cool RN, BSN, Kim RN, BSN Entered By: Christin Fudge on 07/13/2016 10:49:56 [name], [name] ([id number]) -------------------------------------------------------------------------------- HPI Details [name], [name] 07/13/2016 10:00 Patient Name: Date of Service: M. AM Medical Record Patient Account Number: [id number] Number: Treating RN: Cornell Barman Date of Birth/Sex: January 14, 1966 (50 y.o. Male) Other Clinician: Primary Care Physician: SYSTEM, PCP Treating Christin Fudge Referring Physician: Lisa Roca Physician/Extender: Suella Grove in Treatment: 3 History of Present Illness Location: right lower extremity on the lateral calf area Quality: Patient reports experiencing a sharp pain to affected area(s). Severity: Presents with a pain of ____10_on a pain scale of 1-10. Duration: Patient has had the wound for > 3 months prior to seeking treatment at the wound center Timing: Pain in wound is constant (hurts all the time) Context: The wound occurred when the patient may have scratched vigorously in his sleep because of his psoriasis Modifying Factors: Other treatment(s) tried include:takes treatment for psoriasis with local creams and UVB light Associated Signs and Symptoms: Patient reports presence of swelling HPI Description: 50 year old gentleman was seen recently in the ER last week with a chief complaints of cellulitis and skin ulcer. He has a history of CHF, Psoriasis, hypertension, diabetes mellitus and peripheral neuropathy. 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 The ulcer was on  his right lateral calf and has been there for several months and become larger with time. last hemoglobin A1c was 8.3% in May. He has never been a smoker. he had an ultrasound of the right lower extremity for a DVT study and this was normal. Chest x-ray showed no active disease. He was put on clindamycin 300 mg 3 times a day for 10 days. Electronic Signature(s) Signed: 07/13/2016 10:50:20 AM By: Christin Fudge MD, FACS  Entered By: Christin Fudge on 07/13/2016 10:50:18 [name], [name] M. ([id number]) -------------------------------------------------------------------------------- Physical Exam Details [name], [name] 07/13/2016 10:00 Patient Name: Date of Service: M. AM Medical Record Patient Account Number: [id number] Number: Treating RN: Cornell Barman Date of Birth/Sex: 05-13-1966 (50 y.o. Male) Other Clinician: Primary Care Physician: SYSTEM, PCP Treating Christin Fudge Referring Physician: Lisa Roca Physician/Extender: Weeks in Treatment: 3 Constitutional . Pulse regular. Respirations normal and unlabored. Afebrile. . Eyes Nonicteric. Reactive to light. Ears, Nose, Mouth, and Throat Lips, teeth, and gums WNL.[name] Kitchen Moist mucosa without lesions. Neck supple and nontender. No palpable supraclavicular or cervical adenopathy. Normal sized without goiter. Respiratory WNL. No retractions.. Cardiovascular Pedal Pulses WNL. No clubbing, cyanosis or edema. Lymphatic No adneopathy. No adenopathy. No adenopathy. Musculoskeletal Adexa without tenderness or enlargement.. Digits and nails w/o clubbing, cyanosis, infection, petechiae, ischemia, or inflammatory conditions.. Integumentary (Hair, Skin) No suspicious lesions. No crepitus or fluctuance. No peri-wound warmth or erythema. No masses.[name] Kitchen Psychiatric Judgement and insight Intact.. No evidence of depression, anxiety, or agitation.. Notes The wound has some subcutaneous debris which was sharply removed with a #3 curet and  bleeding controlled with pressure. Electronic Signature(s) Signed: 07/13/2016 10:52:48 AM By: Christin Fudge MD, FACS Entered By: Christin Fudge on 07/13/2016 10:52:47 Luckey, Macallan Jerilynn Brown ([id number]) -------------------------------------------------------------------------------- Physician Orders Details [name], [name] 07/13/2016 10:00 Patient Name: Date of Service: M. AM Medical Record Patient Account Number: [id number] Number: Treating RN: Cornell Barman Date of Birth/Sex: 08/02/1966 (50 y.o. Male) Other Clinician: Primary Care Physician: SYSTEM, PCP Treating Britto, Errol Referring Physician: Lisa Roca Physician/Extender: Suella Grove in Treatment: 3 Verbal / Phone Orders: Yes Clinician: Cornell Barman Read Back and Verified: Yes Diagnosis Coding Wound Cleansing Wound #1 Right,Lateral Lower Leg o Cleanse wound with mild soap and water Anesthetic Wound #1 Right,Lateral Lower Leg o Topical Lidocaine 4% cream applied to wound bed prior to debridement Primary Wound Dressing Wound #1 Right,Lateral Lower Leg o Santyl Ointment Secondary Dressing Wound #1 Right,Lateral Lower Leg o Gauze and Kerlix/Conform Dressing Change Frequency Wound #1 Right,Lateral Lower Leg o Change dressing every day. Follow-up Appointments Wound #1 Right,Lateral Lower Leg o Return Appointment in 1 week. Additional Orders / Instructions Wound #1 Right,Lateral Lower Leg o Increase protein intake. o Activity as tolerated Medications-please add to medication list. Wound #1 Right,Lateral Lower Leg o Santyl Enzymatic Ointment [name], [name] ([id number]) Consults o Podiatry - Clip toenails Electronic Signature(s) Signed: 07/13/2016 3:01:37 PM By: Christin Fudge MD, FACS Signed: 07/13/2016 4:38:27 PM By: Gretta Cool RN, BSN, Kim RN, BSN Entered By: Gretta Cool, RN, BSN, Kim on 07/13/2016 10:43:26 [name], [name]  ([id number]) -------------------------------------------------------------------------------- Problem List Details [name], [name] 07/13/2016 10:00 Patient Name: Date of Service: M. AM Medical Record Patient Account Number: [id number] Number: Treating RN: Cornell Barman Date of Birth/Sex: 1966/02/19 (50 y.o. 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 ([id number]) -------------------------------------------------------------------------------- Progress Note Details [name], [name] 07/13/2016 10:00 Patient Name: Date of Service: M. AM Medical Record Patient Account Number: [id number] Number: Treating RN: Cornell Barman Date of Birth/Sex:  04/27/1966 (50 y.o. Male) Other Clinician: Primary Care Physician: SYSTEM, PCP Treating Britto, Errol Referring Physician: Lisa Roca Physician/Extender: Weeks in Treatment: 3 Subjective Chief Complaint Information obtained from Patient Patients presents for treatment of an open diabetic ulcer to the right lower extremity near his car for about 3 months History of Present Illness (HPI) The following HPI elements were documented for the patient's wound: Location: right lower extremity on the lateral calf area Quality: Patient reports experiencing a sharp pain to affected area(s). Severity:  Presents with a pain of ____10_on a pain scale of 1-10. Duration: Patient has had the wound for > 3 months prior to seeking treatment at the wound center Timing: Pain in wound is constant (hurts all the time) Context: The wound occurred when the patient may have scratched vigorously in his sleep because of his psoriasis Modifying Factors: Other treatment(s) tried include:takes treatment for psoriasis with local creams and UVB light Associated Signs and Symptoms: Patient reports presence of swelling 50 year old gentleman was seen recently in the ER last week with a chief complaints of cellulitis and skin ulcer. He has a history of CHF, Psoriasis, hypertension, diabetes mellitus and peripheral neuropathy. 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 The ulcer was on his right lateral calf and has been there for several months and become larger with time. last hemoglobin A1c was 8.3% in May. He has never been a smoker. he had an ultrasound of the right lower extremity for a DVT study and this was normal. Chest x-ray showed no active disease. He was put on clindamycin 300 mg 3 times a day for 10 days. Objective Constitutional Pulse regular. Respirations normal and unlabored. Afebrile. Difranco, Glenden M. ([id number]) Vitals Time Taken: 10:12 AM, Height: 64 in, Weight: 274 lbs, BMI: 47, Temperature: 98.0 F, Pulse: 83 bpm, Respiratory Rate: 20 breaths/min, Blood Pressure: 142/83 mmHg. Eyes Nonicteric. Reactive to light. Ears, Nose, Mouth, and Throat Lips, teeth, and gums WNL.[name] Kitchen Moist mucosa without lesions. Neck supple and nontender. No palpable supraclavicular or cervical adenopathy. Normal sized without goiter. Respiratory WNL. No retractions.. Cardiovascular Pedal Pulses WNL. No clubbing, cyanosis or edema. Lymphatic No adneopathy. No adenopathy. No  adenopathy. Musculoskeletal Adexa without tenderness or enlargement.. Digits and nails w/o clubbing, cyanosis, infection, petechiae, ischemia, or inflammatory conditions.[name] Kitchen Psychiatric Judgement and insight Intact.. No evidence of depression, anxiety, or agitation.. General Notes: The wound has some subcutaneous debris which was sharply removed with a #3 curet and bleeding controlled with pressure. Integumentary (Hair, Skin) No suspicious lesions. No crepitus or fluctuance. No peri-wound warmth or erythema. No masses.. Wound #1 status is Open. Original cause of wound was Gradually Appeared. The wound is located on the Right,Lateral Lower Leg. The wound measures 1.5cm length x 1.2cm width x 0.4cm depth; 1.414cm^2 area and 0.565cm^3 volume. The wound is limited to skin breakdown. There is no tunneling or undermining noted. There is a large amount of serosanguineous drainage noted. The wound margin is distinct with the outline attached to the wound base. There is small (1-33%) pink granulation within the wound bed. There is a large (67-100%) amount of necrotic tissue within the wound bed including Adherent Slough. The periwound skin appearance exhibited: Localized Edema, Moist. The periwound skin appearance did not exhibit: Callus, Crepitus, Excoriation, Fluctuance, Friable, Induration, Rash, Scarring, Dry/Scaly, Maceration, Atrophie Blanche, Cyanosis, Ecchymosis, Hemosiderin Staining, Mottled, Pallor, Rubor, Erythema. Periwound temperature was noted as No Abnormality. The periwound  has tenderness on palpation. FAOLAN, HONKOMP ([id number]) Assessment Active Problems ICD-10 E11.622 - Type 2 diabetes mellitus with other skin ulcer L97.212 - Non-pressure chronic ulcer of right calf with fat layer exposed L40.9 - Psoriasis, unspecified E66.01 - Morbid (severe) obesity due to excess calories Procedures Wound #1 Wound #1 is a Diabetic Wound/Ulcer of the Lower Extremity located on the  Right,Lateral Lower Leg . There was a Skin/Subcutaneous Tissue Debridement [id number]) debridement with total area of 1.8 sq cm performed by Christin Fudge, MD. with the following instrument(s): Curette to remove Viable and Non-Viable tissue/material including Exudate, Fibrin/Slough, and Subcutaneous after achieving pain control using Other (lidocaine 4%). A time out was conducted prior to the start of the procedure. A Minimum amount of bleeding was controlled with Pressure. The procedure was tolerated well with a pain level of 2 throughout and a pain level of 2 following the procedure. Post Debridement Measurements: 1.5cm length x 1.2cm width x 0.4cm depth; 0.565cm^3 volume. Post procedure Diagnosis Wound #1: Same as Pre-Procedure Plan Wound Cleansing: Wound #1 Right,Lateral Lower Leg: Cleanse wound with mild soap and water Anesthetic: Wound #1 Right,Lateral Lower Leg: Topical Lidocaine 4% cream applied to wound bed prior to debridement Primary Wound Dressing: Wound #1 Right,Lateral Lower Leg: Santyl Ointment Secondary Dressing: Wound #1 Right,Lateral Lower Leg: Gauze and Kerlix/Conform Kimmel, Cyan M. ([id number]) Dressing Change Frequency: Wound #1 Right,Lateral Lower Leg: Change dressing every day. Follow-up Appointments: Wound #1 Right,Lateral Lower Leg: Return Appointment in 1 week. Additional Orders / Instructions: Wound #1 Right,Lateral Lower Leg: Increase protein intake. Activity as tolerated Medications-please add to medication list.: Wound #1 Right,Lateral Lower Leg: Santyl Enzymatic Ointment Consults ordered were: Podiatry - Clip toenails I have recommended: 1. Washing this area with soap and water daily and scrubbing the area well 2. Santyl ointment locally to be changed daily 3. Regular visits to the wound center 4. Completing his course of clindamycin asked prescribed by the ER Electronic Signature(s) Signed: 07/13/2016 10:53:31 AM By: Christin Fudge MD,  FACS Entered By: Christin Fudge on 07/13/2016 10:53:31 Breck, Tavarius M. ([id number]) -------------------------------------------------------------------------------- SuperBill Details Patient Name: [name] Date of Service: 07/13/2016 Medical Record Number: [id number] Patient Account Number: [id number] Date of Birth/Sex: [birth year] (50 y.o. 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 L40.9 Psoriasis, unspecified E66.01 Morbid (severe) obesity due to excess calories Facility Procedures CPT4 Code: [id number] Description: [id number] - DEB SUBQ TISSUE 20 SQ CM/< ICD-10 Description Diagnosis E11.622 Type 2 diabetes mellitus with other skin ulcer L97.212 Non-pressure chronic ulcer of right calf with fat L40.9 Psoriasis, unspecified E66.01 Morbid (severe) obesity due  to excess calories Modifier: layer exposed Quantity: 1 Physician Procedures CPT4 Code: [id number] Description: [id number] - WC PHYS SUBQ TISS 20 SQ CM ICD-10 Description Diagnosis E11.622 Type 2 diabetes mellitus with other skin ulcer L97.212 Non-pressure chronic ulcer of right calf with fat L40.9 Psoriasis, unspecified E66.01 Morbid (severe) obesity due  to excess calories Modifier: layer exposed Quantity: 1 Electronic Signature(s) Signed: 07/13/2016 10:55:09 AM By: Christin Fudge MD, FACS Entered By: Christin Fudge on 07/13/2016 10:55:09

## 2016-07-20 ENCOUNTER — Encounter: Payer: BLUE CROSS/BLUE SHIELD | Attending: Surgery | Admitting: Surgery

## 2016-07-20 DIAGNOSIS — L409 Psoriasis, unspecified: Secondary | ICD-10-CM | POA: Insufficient documentation

## 2016-07-20 DIAGNOSIS — I110 Hypertensive heart disease with heart failure: Secondary | ICD-10-CM | POA: Insufficient documentation

## 2016-07-20 DIAGNOSIS — G4733 Obstructive sleep apnea (adult) (pediatric): Secondary | ICD-10-CM | POA: Diagnosis not present

## 2016-07-20 DIAGNOSIS — Z6841 Body Mass Index (BMI) 40.0 and over, adult: Secondary | ICD-10-CM | POA: Insufficient documentation

## 2016-07-20 DIAGNOSIS — E11622 Type 2 diabetes mellitus with other skin ulcer: Secondary | ICD-10-CM | POA: Diagnosis not present

## 2016-07-20 DIAGNOSIS — L97212 Non-pressure chronic ulcer of right calf with fat layer exposed: Secondary | ICD-10-CM | POA: Diagnosis not present

## 2016-07-20 DIAGNOSIS — M109 Gout, unspecified: Secondary | ICD-10-CM | POA: Diagnosis not present

## 2016-07-20 DIAGNOSIS — I5042 Chronic combined systolic (congestive) and diastolic (congestive) heart failure: Secondary | ICD-10-CM | POA: Diagnosis not present

## 2016-07-20 DIAGNOSIS — E1140 Type 2 diabetes mellitus with diabetic neuropathy, unspecified: Secondary | ICD-10-CM | POA: Insufficient documentation

## 2016-07-20 DIAGNOSIS — F419 Anxiety disorder, unspecified: Secondary | ICD-10-CM | POA: Insufficient documentation

## 2016-07-20 NOTE — Progress Notes (Addendum)
DJAY, MACNEAL ([id number]) Visit Report for 07/20/2016 Arrival Information Details Patient Name: [name], [name] Date of Service: 07/20/2016 10:45 AM Medical Record Number: [id number] Patient Account Number: [id number] Date of Birth/Sex: September 25, 1966 (50 y.o. Male) Treating RN: [name], RN, BSN, [place]va Harman Primary Care Physician: SYSTEM, PCP Other Clinician: Referring Physician: Lisa Roca Treating Physician/Extender: Frann Rider in Treatment: 4 Visit Information History Since Last Visit All ordered tests and consults were completed: No Patient Arrived: Ambulatory Added or deleted any medications: No Arrival Time: 10:39 Any new allergies or adverse reactions: No Accompanied By: self Had a fall or experienced change in No Transfer Assistance: None activities of daily living that may affect Patient Identification Verified: Yes risk of falls: Secondary Verification Process Yes Signs or symptoms of abuse/neglect since last No Completed: visito Patient Requires Transmission- No Hospitalized since last visit: No Based Precautions: Has Dressing in Place as Prescribed: Yes Patient Has Alerts: Yes Pain Present Now: Yes Patient Alerts: Patient on Blood Thinner ASA DM2 A1c 9 Electronic Signature(s) Signed: 07/20/2016 10:39:58 AM By: Regan Lemming BSN, RN Entered By: Regan Lemming on 07/20/2016 10:39:58 Potvin, [name] ([id number]) -------------------------------------------------------------------------------- Encounter Discharge Information Details Patient Name: [name] Date of Service: 07/20/2016 10:45 AM Medical Record Number: [id number] Patient Account Number: [id number] Date of Birth/Sex: 07/31/1966 (50 y.o. Male) Treating RN: [name], RN, BSN, [place] Primary Care Physician: SYSTEM, PCP Other Clinician: Referring Physician: Lisa Roca Treating Physician/Extender: Frann Rider in Treatment: 4 Encounter Discharge Information Items Discharge Pain  Level: 0 Discharge Condition: Stable Ambulatory Status: Ambulatory Discharge Destination: Home Transportation: Private Auto Accompanied By: self Schedule Follow-up Appointment: No Medication Reconciliation completed and provided to Patient/Care No Provider: Provided on Clinical Summary of Care: 07/20/2016 Form Type Recipient Paper Patient JT Electronic Signature(s) Signed: 07/20/2016 11:19:50 AM By: Regan Lemming BSN, RN Previous Signature: 07/20/2016 11:19:29 AM Version By: Ruthine Dose Entered By: Regan Lemming on 07/20/2016 11:19:50 [name], [name] ([id number]) -------------------------------------------------------------------------------- Lower Extremity Assessment Details Patient Name: [name] Date of Service: 07/20/2016 10:45 AM Medical Record Number: [id number] Patient Account Number: [id number] Date of Birth/Sex: 12/03/1966 (50 y.o. Male) Treating RN: [name], RN, BSN, [place] Primary Care Physician: SYSTEM, PCP Other Clinician: Referring Physician: Lisa Roca Treating Physician/Extender: Frann Rider in Treatment: 4 Vascular Assessment Pulses: Posterior Tibial Dorsalis Pedis Palpable: [Right:Yes] Extremity colors, hair growth, and conditions: Extremity Color: [Right:Hyperpigmented] Hair Growth on Extremity: [Right:No] Temperature of Extremity: [Right:Warm] Capillary Refill: [Right:< 3 seconds] Electronic Signature(s) Signed: 07/20/2016 12:59:00 PM By: Regan Lemming BSN, RN Entered By: Regan Lemming on 07/20/2016 10:42:03 [name], [name] ([id number]) -------------------------------------------------------------------------------- Multi Wound Chart Details Patient Name: [name] Date of Service: 07/20/2016 10:45 AM Medical Record Number: [id number] Patient Account Number: [id number] Date of Birth/Sex: 27-Sep-1966 (50 y.o. Male) Treating RN: Montey Hora Primary Care Physician: SYSTEM, PCP Other Clinician: Referring Physician: Lisa Roca Treating Physician/Extender: Frann Rider in Treatment: 4 Vital Signs Height(in): 64 Pulse(bpm): 81 Weight(lbs): 274 Blood Pressure 148/91 (mmHg): Body Mass Index(BMI): 47 Temperature(F): 98.7 Respiratory Rate 20 (breaths/min): Photos: [1:No Photos] [N/A:N/A] Wound Location: [1:Right, Lateral Lower Leg] [N/A:N/A] Wounding Event: [1:Gradually Appeared] [N/A:N/A] Primary Etiology: [1:Diabetic Wound/Ulcer of the Lower Extremity] [N/A:N/A] Date Acquired: [1:04/23/2016] [N/A:N/A] Weeks of Treatment: [1:4] [N/A:N/A] Wound Status: [1:Open] [N/A:N/A] Measurements L x W x D 1.9x1.3x0.4 [N/A:N/A] (cm) Area (cm) : [1:1.94] [N/A:N/A] Volume (cm) : [1:0.776] [N/A:N/A] % Reduction in Area: [1:-9.80%] [N/A:N/A] % Reduction in Volume: 12.20% [N/A:N/A] Classification: [1:Grade 1] [N/A:N/A] Periwound Skin Texture: No Abnormalities Noted [N/A:N/A] Periwound  Skin [1:No Abnormalities Noted] [N/A:N/A] Moisture: Periwound Skin Color: No Abnormalities Noted [N/A:N/A] Tenderness on [1:No] [N/A:N/A] Treatment Notes Electronic Signature(s) Signed: 07/20/2016 4:47:48 PM By: Montey Hora Entered By: Montey Hora on 07/20/2016 11:09:18 [name], [name] ([id number]) [name], [name] ([id number]) -------------------------------------------------------------------------------- Multi-Disciplinary Care Plan Details Patient Name: [name] Date of Service: 07/20/2016 10:45 AM Medical Record Number: [id number] Patient Account Number: [id number] Date of Birth/Sex: 07-23-1966 (50 y.o. Male) Treating RN: Montey Hora Primary Care Physician: SYSTEM, PCP Other Clinician: Referring Physician: Lisa Roca Treating Physician/Extender: Frann Rider in Treatment: 4 Active Inactive Orientation to the Wound Care Program Nursing Diagnoses: Knowledge deficit related to the wound healing center program Goals: Patient/caregiver will verbalize understanding of the [place] Program Date Initiated: 06/18/2016 Goal Status: Active Interventions: Provide education on orientation to the wound center Notes: Venous Leg Ulcer Nursing Diagnoses: Knowledge deficit related to disease process and management Potential for venous Insuffiency (use before diagnosis confirmed) Goals: Patient will maintain optimal edema control Date Initiated: 06/18/2016 Goal Status: Active Patient/caregiver will verbalize understanding of disease process and disease management Date Initiated: 06/18/2016 Goal Status: Active Verify adequate tissue perfusion prior to therapeutic compression application Date Initiated: 06/18/2016 Goal Status: Active Interventions: Compression as ordered Provide education on venous insufficiency Notes: [name], [name] ([id number]) Wound/Skin Impairment Nursing Diagnoses: Impaired tissue integrity Goals: Patient/caregiver will verbalize understanding of skin care regimen Date Initiated: 06/18/2016 Goal Status: Active Ulcer/skin breakdown will have a volume reduction of 30% by week 4 Date Initiated: 06/18/2016 Goal Status: Active Ulcer/skin breakdown will have a volume reduction of 50% by week 8 Date Initiated: 06/18/2016 Goal Status: Active Ulcer/skin breakdown will have a volume reduction of 80% by week 12 Date Initiated: 06/18/2016 Goal Status: Active Ulcer/skin breakdown will heal within 14 weeks Date Initiated: 06/18/2016 Goal Status: Active Interventions: Assess patient/caregiver ability to obtain necessary supplies Assess patient/caregiver ability to perform ulcer/skin care regimen upon admission and as needed Assess ulceration(s) every visit Provide education on ulcer and skin care Treatment Activities: Skin care regimen initiated : 06/18/2016 Topical wound management initiated : 06/18/2016 Notes: Electronic Signature(s) Signed: 07/20/2016 4:47:48 PM By: Montey Hora Entered By: Montey Hora on 07/20/2016 11:09:06 [name],  [name] ([id number]) -------------------------------------------------------------------------------- Pain Assessment Details Patient Name: [name] Date of Service: 07/20/2016 10:45 AM Medical Record Number: [id number] Patient Account Number: [id number] Date of Birth/Sex: [birth year] (50 y.o. Male) Treating RN: [name], RN, BSN, [place] Primary Care Physician: SYSTEM, PCP Other Clinician: Referring Physician: Lisa Roca Treating Physician/Extender: Frann Rider in Treatment: 4 Active Problems Location of Pain Severity and Description of Pain Patient Has Paino Yes Site Locations Pain Location: Pain in Ulcers With Dressing Change: Yes Duration of the Pain. Constant / Intermittento Constant Rate the pain. Current Pain Level: 5 Worst Pain Level: 7 Character of Pain Describe the Pain: Aching, Tender Pain Management and Medication Current Pain Management: Medication: Yes Rest: Yes How does your pain impact your activities of daily livingo Sleep: Yes Bathing: Yes Appetite: Yes Relationship With Others: Yes Bladder Continence: Yes Emotions: Yes Bowel Continence: Yes Work: Yes Toileting: Yes Drive: Yes Dressing: Yes Hobbies: Yes Electronic Signature(s) Signed: 07/20/2016 10:40:24 AM By: Regan Lemming BSN, RN Entered By: Regan Lemming on 07/20/2016 10:40:24 [name], [name] ([id number]) -------------------------------------------------------------------------------- Patient/Caregiver Education Details Patient Name: [name] Date of Service: 07/20/2016 10:45 AM Medical Record Number: [id number] Patient Account Number: [id number] Date of Birth/Gender: 09-Sep-1966 (50 y.o. Male) Treating RN: [name], RN, BSN, [place] Primary Care Physician: SYSTEM, PCP Other  Clinician: Referring Physician: Lisa Roca Treating Physician/Extender: Frann Rider in Treatment: 4 Education Assessment Education Provided To: Patient Education Topics  Provided Venous: Methods: Explain/Verbal Responses: State content correctly Welcome To [facility] [place]: Methods: Explain/Verbal Responses: State content correctly Wound/Skin Impairment: Methods: Explain/Verbal Responses: State content correctly Electronic Signature(s) Signed: 07/20/2016 12:59:00 PM By: Regan Lemming BSN, RN Entered By: Regan Lemming on 07/20/2016 11:20:20 [name], [name] ([id number]) -------------------------------------------------------------------------------- Wound Assessment Details Patient Name: [name] Date of Service: 07/20/2016 10:45 AM Medical Record Number: [id number] Patient Account Number: [id number] Date of Birth/Sex: [birth year] (50 y.o. Male) Treating RN: [name], RN, BSN, [place] Primary Care Physician: SYSTEM, PCP Other Clinician: Referring Physician: Lisa Roca Treating Physician/Extender: Frann Rider in Treatment: 4 Wound Status Wound Number: 1 Primary Diabetic Wound/Ulcer of the Lower Etiology: Extremity Wound Location: Right, Lateral Lower Leg Wound Status: Open Wounding Event: Gradually Appeared Date Acquired: 04/23/2016 Weeks Of Treatment: 4 Clustered Wound: No Wound Measurements Length: (cm) 1.9 Width: (cm) 1.3 Depth: (cm) 0.4 Area: (cm) 1.94 Volume: (cm) 0.776 % Reduction in Area: -9.8% % Reduction in Volume: 12.2% Wound Description Classification: Grade 1 Periwound Skin Texture Texture Color No Abnormalities Noted: No No Abnormalities Noted: No Moisture No Abnormalities Noted: No Treatment Notes Wound #1 (Right, Lateral Lower Leg) 1. Cleansed with: Clean wound with Normal Saline 2. Anesthetic Topical Lidocaine 4% cream to wound bed prior to debridement 4. Dressing Applied: Santyl Ointment 5. Secondary Dressing Applied Non-Adherent pad 7. Secured with Other (specify in notes) Notes conform [name], Rameses M. ([id number]) Electronic Signature(s) Signed: 07/20/2016 12:59:00 PM By: Regan Lemming BSN, RN Entered By: Regan Lemming on 07/20/2016 10:47:32 Brickner, Kenric MMarland Kitchen ([id number]) -------------------------------------------------------------------------------- Vitals Details Patient Name: [name] Date of Service: 07/20/2016 10:45 AM Medical Record Number: [id number] Patient Account Number: [id number] Date of Birth/Sex: 05/29/1966 (50 y.o. Male) Treating RN: [name], RN, BSN, [place] Primary Care Physician: SYSTEM, PCP Other Clinician: Referring Physician: Lisa Roca Treating Physician/Extender: Frann Rider in Treatment: 4 Vital Signs Time Taken: 10:44 Temperature (F): 98.7 Height (in): 64 Pulse (bpm): 81 Weight (lbs): 274 Respiratory Rate (breaths/min): 20 Body Mass Index (BMI): 47 Blood Pressure (mmHg): 148/91 Reference Range: 80 - 120 mg / dl Electronic Signature(s) Signed: 07/20/2016 12:59:00 PM By: Regan Lemming BSN, RN Entered By: Regan Lemming on 07/20/2016 10:45:08

## 2016-07-21 NOTE — Progress Notes (Signed)
[name], [name] ([id number]) Visit Report for 07/20/2016 Chief Complaint Document Details Messina, Andric Date of Service: 07/20/2016 10:45 AM Patient Name: M. Patient Account Number: [id number] Medical Record [name], RN, BSN, [id number] Treating RN: Number: Velva Harman Date of Birth/Sex: [birth year] (50 y.o. Male) Other Clinician: Primary Care Physician: SYSTEM, PCP Treating Christin Fudge Referring Physician: Lisa Roca Physician/Extender: Suella Grove in Treatment: 4 Information Obtained from: Patient Chief Complaint Patients presents for treatment of an open diabetic ulcer to the right lower extremity near his car for about 3 months Electronic Signature(s) Signed: 07/20/2016 11:13:04 AM By: Christin Fudge MD, FACS Entered By: Christin Fudge on 07/20/2016 11:13:03 Sandles, Bernon M. ([id number]) -------------------------------------------------------------------------------- Debridement Details Schnepp, Richardson Date of Service: 07/20/2016 10:45 AM Patient Name: [name] Patient Account Number: [id number] Medical Record [name], RN, BSN, [id number] Treating RN: Number: Velva Harman Date of Birth/Sex: 1966/05/27 (50 y.o. Male) Other Clinician: Primary Care Physician: SYSTEM, PCP Treating Britto, Errol Referring Physician: Lisa Roca Physician/Extender: Suella Grove in Treatment: 4 Debridement Performed for Wound #1 Right,Lateral Lower Leg Assessment: Performed By: Physician Christin Fudge, MD Debridement: Debridement Pre-procedure Yes Verification/Time Out Taken: Start Time: 11:08 Pain Control: Lidocaine 4% Topical Solution Level: Skin/Subcutaneous Tissue Total Area Debrided (L x 1.9 (cm) x 1.3 (cm) = 2.47 (cm) W): Tissue and other Viable, Non-Viable, Fibrin/Slough, Subcutaneous material debrided: Instrument: Curette Bleeding: Minimum Hemostasis Achieved: Pressure End Time: 11:10 Procedural Pain: 0 Post Procedural Pain: 0 Response to Treatment: Procedure was tolerated well Post Debridement  Measurements of Total Wound Length: (cm) 1.9 Width: (cm) 1.3 Depth: (cm) 0.4 Volume: (cm) 0.776 Post Procedure Diagnosis Same as Pre-procedure Electronic Signature(s) Signed: 07/20/2016 11:12:54 AM By: Christin Fudge MD, FACS Signed: 07/20/2016 12:59:00 PM By: Regan Lemming BSN, RN Entered By: Christin Fudge on 07/20/2016 11:12:54 Brown, [name] ([id number]) -------------------------------------------------------------------------------- HPI Details Brown, [name] Date of Service: 07/20/2016 10:45 AM Patient Name: [name] Patient Account Number: [id number] Medical Record [name], RN, BSN, [id number] Treating RN: Number: Velva Harman Date of Birth/Sex: 14-Mar-1966 (50 y.o. Male) Other Clinician: Primary Care Physician: SYSTEM, PCP Treating Christin Fudge Referring Physician: Lisa Roca Physician/Extender: Suella Grove in Treatment: 4 History of Present Illness Location: right lower extremity on the lateral calf area Quality: Patient reports experiencing a sharp pain to affected area(s). Severity: Presents with a pain of ____10_on a pain scale of 1-10. Duration: Patient has had the wound for > 3 months prior to seeking treatment at the wound center Timing: Pain in wound is constant (hurts all the time) Context: The wound occurred when the patient may have scratched vigorously in his sleep because of his psoriasis Modifying Factors: Other treatment(s) tried include:takes treatment for psoriasis with local creams and UVB light Associated Signs and Symptoms: Patient reports presence of swelling HPI Description: 50 year old gentleman was seen recently in the ER last week with a chief complaints of cellulitis and skin ulcer. He has a history of CHF, Psoriasis, hypertension, diabetes mellitus and peripheral neuropathy. 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 The ulcer  was on his right lateral calf and has been there for several months and become larger with time. last hemoglobin A1c was 8.3% in May. He has never been a smoker. he had an ultrasound of the right lower extremity for a DVT study and this was normal. Chest x-ray showed no active disease. He was put on clindamycin 300 mg 3 times a day for 10 days. Electronic Signature(s) Signed: 07/20/2016 11:13:16 AM By: Con Memos,  Roderick Pee MD, FACS Entered By: Christin Fudge on 07/20/2016 11:13:15 Stooksbury, Sharmarke [name] ([id number]) -------------------------------------------------------------------------------- Physical Exam Details Learn, Khaliq Date of Service: 07/20/2016 10:45 AM Patient Name: M. Patient Account Number: [id number] Medical Record [name], RN, BSN, [id number] Treating RN: Number: Velva Harman Date of Birth/Sex: 03-27-1966 (50 y.o. Male) Other Clinician: Primary Care Physician: SYSTEM, PCP Treating Christin Fudge Referring Physician: Lisa Roca Physician/Extender: Weeks in Treatment: 4 Constitutional . Pulse regular. Respirations normal and unlabored. Afebrile. . Eyes Nonicteric. Reactive to light. Ears, Nose, Mouth, and Throat Lips, teeth, and gums WNL.[name] Kitchen Moist mucosa without lesions. Neck supple and nontender. No palpable supraclavicular or cervical adenopathy. Normal sized without goiter. Respiratory WNL. No retractions.. Cardiovascular Pedal Pulses WNL. No clubbing, cyanosis or edema. Chest Breasts symmetical and no nipple discharge.. Breast tissue WNL, no masses, lumps, or tenderness.. Lymphatic No adneopathy. No adenopathy. No adenopathy. Musculoskeletal Adexa without tenderness or enlargement.. Digits and nails w/o clubbing, cyanosis, infection, petechiae, ischemia, or inflammatory conditions.. Integumentary (Hair, Skin) No suspicious lesions. No crepitus or fluctuance. No peri-wound warmth or erythema. No masses.[name] Kitchen Psychiatric Judgement and insight Intact.. No evidence of depression,  anxiety, or agitation.. Notes The wound has some subcutaneous debris which was sharply removed with a #3 curet and bleeding controlled with pressure. Electronic Signature(s) Signed: 07/20/2016 11:13:45 AM By: Christin Fudge MD, FACS Entered By: Christin Fudge on 07/20/2016 11:13:44 Eidem, Yovanni [name] ([id number]) -------------------------------------------------------------------------------- Physician Orders Details Patient Name: [name] Date of Service: 07/20/2016 10:45 AM Medical Record Number: [id number] Patient Account Number: [id number] Date of Birth/Sex: 03-Jun-1966 (50 y.o. Male) Treating RN: Montey Hora Primary Care Physician: SYSTEM, PCP Other Clinician: Referring Physician: Lisa Roca Treating Physician/Extender: Frann Rider in Treatment: 4 Verbal / Phone Orders: Yes Clinician: Montey Hora Read Back and Verified: Yes Diagnosis Coding Wound Cleansing Wound #1 Right,Lateral Lower Leg o Cleanse wound with mild soap and water Anesthetic Wound #1 Right,Lateral Lower Leg o Topical Lidocaine 4% cream applied to wound bed prior to debridement Primary Wound Dressing Wound #1 Right,Lateral Lower Leg o Santyl Ointment Secondary Dressing Wound #1 Right,Lateral Lower Leg o Gauze and Kerlix/Conform Dressing Change Frequency Wound #1 Right,Lateral Lower Leg o Change dressing every day. Follow-up Appointments Wound #1 Right,Lateral Lower Leg o Return Appointment in 1 week. Additional Orders / Instructions Wound #1 Right,Lateral Lower Leg o Increase protein intake. o Activity as tolerated Medications-please add to medication list. Wound #1 Right,Lateral Lower Leg o Santyl Enzymatic Ointment JAVIER, MONGILLO ([id number]) Electronic Signature(s) Signed: 07/20/2016 4:14:16 PM By: Christin Fudge MD, FACS Signed: 07/20/2016 4:47:48 PM By: Montey Hora Entered By: Montey Hora on 07/20/2016 11:10:41 Lucia, Christopher M.  ([id number]) -------------------------------------------------------------------------------- Problem List Details Villela, Lyden Date of Service: 07/20/2016 10:45 AM Patient Name: M. Patient Account Number: [id number] Medical Record [name], RN, BSN, [id number] Treating RN: Number: Velva Harman Date of Birth/Sex: 1966/05/30 (50 y.o. 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 ([id number]) -------------------------------------------------------------------------------- Progress Note Details Gallina, Danny Date of Service: 07/20/2016 10:45 AM Patient Name: [name] Patient Account Number: [id number] Medical  Record [name], RN, BSN, [id number] Treating RN: Number: Velva Harman Date of Birth/Sex: 1966-06-04 (50 y.o. Male) Other Clinician: Primary Care Physician: SYSTEM, PCP Treating Britto, Errol Referring Physician: Lisa Roca Physician/Extender: Weeks in Treatment: 4 Subjective Chief Complaint Information obtained from Patient Patients presents for treatment of an open diabetic ulcer to the right lower extremity near his car for about 3 months History of Present Illness (HPI) The following HPI elements were documented for the patient's wound: Location: right lower extremity on the lateral calf area Quality: Patient reports experiencing a sharp pain to affected  area(s). Severity: Presents with a pain of ____10_on a pain scale of 1-10. Duration: Patient has had the wound for > 3 months prior to seeking treatment at the wound center Timing: Pain in wound is constant (hurts all the time) Context: The wound occurred when the patient may have scratched vigorously in his sleep because of his psoriasis Modifying Factors: Other treatment(s) tried include:takes treatment for psoriasis with local creams and UVB light Associated Signs and Symptoms: Patient reports presence of swelling 50 year old gentleman was seen recently in the ER last week with a chief complaints of cellulitis and skin ulcer. He has a history of CHF, Psoriasis, hypertension, diabetes mellitus and peripheral neuropathy. 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 The ulcer was on his right lateral calf and has been there for several months and become larger with time. last hemoglobin A1c was 8.3% in May. He has never been a smoker. he had an ultrasound of the right lower extremity for a DVT study and this was normal. Chest x-ray showed no active disease. He was put on clindamycin 300 mg 3 times a day for 10 days. Objective Constitutional Pulse regular. Respirations normal and unlabored. Afebrile. Perea, Dontrae M. ([id number]) Vitals Time Taken: 10:44 AM, Height: 64 in, Weight: 274 lbs, BMI: 47, Temperature: 98.7 F, Pulse: 81 bpm, Respiratory Rate: 20 breaths/min, Blood Pressure: 148/91 mmHg. Eyes Nonicteric. Reactive to light. Ears, Nose, Mouth, and Throat Lips, teeth, and gums WNL.[name] Kitchen Moist mucosa without lesions. Neck supple and nontender. No palpable supraclavicular or cervical adenopathy. Normal sized without goiter. Respiratory WNL. No retractions.. Cardiovascular Pedal Pulses WNL. No clubbing, cyanosis or edema. Chest Breasts symmetical  and no nipple discharge.. Breast tissue WNL, no masses, lumps, or tenderness.. Lymphatic No adneopathy. No adenopathy. No adenopathy. Musculoskeletal Adexa without tenderness or enlargement.. Digits and nails w/o clubbing, cyanosis, infection, petechiae, ischemia, or inflammatory conditions.[name] Kitchen Psychiatric Judgement and insight Intact.. No evidence of depression, anxiety, or agitation.. General Notes: The wound has some subcutaneous debris which was sharply removed with a #3 curet and bleeding controlled with pressure. Integumentary (Hair, Skin) No suspicious lesions. No crepitus or fluctuance. No peri-wound warmth or erythema. No masses.. Wound #1 status is Open. Original cause of wound was Gradually Appeared. The wound is located on the Right,Lateral Lower Leg. The wound measures 1.9cm length x 1.3cm width x 0.4cm depth; 1.94cm^2 area and 0.776cm^3 volume. Assessment REYMOND, BARRUS ([id number]) Active Problems ICD-10 E11.622 - Type 2 diabetes mellitus with other skin ulcer L97.212 - Non-pressure chronic ulcer of right calf with fat layer exposed L40.9 - Psoriasis, unspecified E66.01 - Morbid (severe) obesity due to excess calories Procedures Wound #1 Wound #1 is a Diabetic Wound/Ulcer of the Lower Extremity located on the Right,Lateral Lower Leg . There was a Skin/Subcutaneous Tissue Debridement [id number]) debridement with total area of 2.47 sq cm performed  by Christin Fudge, MD. with the following instrument(s): Curette to remove Viable and Non-Viable tissue/material including Fibrin/Slough and Subcutaneous after achieving pain control using Lidocaine 4% Topical Solution. A time out was conducted prior to the start of the procedure. A Minimum amount of bleeding was controlled with Pressure. The procedure was tolerated well with a pain level of 0 throughout and a pain level of 0 following the procedure. Post Debridement Measurements: 1.9cm length x 1.3cm width x 0.4cm depth;  0.776cm^3 volume. Post procedure Diagnosis Wound #1: Same as Pre-Procedure Plan Wound Cleansing: Wound #1 Right,Lateral Lower Leg: Cleanse wound with mild soap and water Anesthetic: Wound #1 Right,Lateral Lower Leg: Topical Lidocaine 4% cream applied to wound bed prior to debridement Primary Wound Dressing: Wound #1 Right,Lateral Lower Leg: Santyl Ointment Secondary Dressing: Wound #1 Right,Lateral Lower Leg: Gauze and Kerlix/Conform Dressing Change Frequency: Wound #1 Right,Lateral Lower Leg: Change dressing every day. Follow-up Appointments: Wound #1 Right,Lateral Lower Leg: DAYSHAWN, MEHMEDOVIC. ([id number]) Return Appointment in 1 week. Additional Orders / Instructions: Wound #1 Right,Lateral Lower Leg: Increase protein intake. Activity as tolerated Medications-please add to medication list.: Wound #1 Right,Lateral Lower Leg: Santyl Enzymatic Ointment I have recommended: 1. Washing this area with soap and water daily and scrubbing the area well 2. Santyl ointment locally to be changed daily 3. Regular visits to the wound center 4. Completing his course of clindamycin asked prescribed by the ER Electronic Signature(s) Signed: 07/20/2016 11:14:28 AM By: Christin Fudge MD, FACS Entered By: Christin Fudge on 07/20/2016 11:14:28 Guglielmo, Ameen M. ([id number]) -------------------------------------------------------------------------------- SuperBill Details Wieseler, Makhari Date of Service: 07/20/2016 Patient Name: [name] Patient Account Number: [id number] Medical Record [name], RN, BSN, [id number] Treating RN: Number: Velva Harman Date of Birth/Sex: 06/09/1966 (50 y.o. Male) Other Clinician: Primary Care Physician: SYSTEM, PCP Treating Christin Fudge Referring Physician: Lisa Roca Physician/Extender: Suella Grove in Treatment: 4 Diagnosis Coding ICD-10 Codes Code Description E11.622 Type 2 diabetes mellitus with other skin ulcer L97.212 Non-pressure chronic ulcer of right calf with fat  layer exposed L40.9 Psoriasis, unspecified E66.01 Morbid (severe) obesity due to excess calories Facility Procedures CPT4 Code: [id number] Description: [id number] - DEB SUBQ TISSUE 20 SQ CM/< ICD-10 Description Diagnosis E11.622 Type 2 diabetes mellitus with other skin ulcer L97.212 Non-pressure chronic ulcer of right calf with fat E66.01 Morbid (severe) obesity due to excess calories L40.9  Psoriasis, unspecified Modifier: layer exposed Quantity: 1 Physician Procedures CPT4 Code: [id number] Description: [id number] - WC PHYS SUBQ TISS 20 SQ CM ICD-10 Description Diagnosis E11.622 Type 2 diabetes mellitus with other skin ulcer L97.212 Non-pressure chronic ulcer of right calf with fat E66.01 Morbid (severe) obesity due to excess calories L40.9  Psoriasis, unspecified Modifier: layer exposed Quantity: 1 Electronic Signature(s) Signed: 07/20/2016 11:14:41 AM By: Christin Fudge MD, FACS Entered By: Christin Fudge on 07/20/2016 11:14:40

## 2016-07-27 ENCOUNTER — Ambulatory Visit: Payer: BLUE CROSS/BLUE SHIELD | Admitting: Surgery

## 2016-08-09 ENCOUNTER — Encounter: Payer: BLUE CROSS/BLUE SHIELD | Admitting: Surgery

## 2016-08-09 DIAGNOSIS — E11622 Type 2 diabetes mellitus with other skin ulcer: Secondary | ICD-10-CM | POA: Diagnosis not present

## 2016-08-10 NOTE — Progress Notes (Signed)
KHAMARI, TERNES ([id number]) Visit Report for 08/09/2016 Chief Complaint Document Details Patient Name: [name], [name] Date of Service: 08/09/2016 8:00 AM Medical Record Number: [id number] Patient Account Number: [id number] Date of Birth/Sex: 27-Oct-1966 (50 y.o. Male) Treating RN: Cornell Barman Primary Care Physician: SYSTEM, PCP Other Clinician: Referring Physician: Lisa Roca Treating Physician/Extender: Frann Rider in Treatment: 7 Information Obtained from: Patient Chief Complaint Patients presents for treatment of an open diabetic ulcer to the right lower extremity near his car for about 3 months Electronic Signature(s) Signed: 08/09/2016 9:11:55 AM By: Christin Fudge MD, FACS Entered By: Christin Fudge on 08/09/2016 09:11:55 [name], [name] ([id number]) -------------------------------------------------------------------------------- Debridement Details Patient Name: [name] Date of Service: 08/09/2016 8:00 AM Medical Record Number: [id number] Patient Account Number: [id number] Date of Birth/Sex: 08-21-1966 (50 y.o. Male) Treating RN: Cornell Barman Primary Care Physician: SYSTEM, PCP Other Clinician: Referring Physician: Lisa Roca Treating Physician/Extender: Frann Rider in Treatment: 7 Debridement Performed for Wound #1 Right,Lateral Lower Leg Assessment: Performed By: Physician Christin Fudge, MD Debridement: Debridement Pre-procedure Yes - 08:30 Verification/Time Out Taken: Start Time: 08:30 Pain Control: Other : lidocaine 4% Level: Skin/Subcutaneous Tissue Total Area Debrided (L x 1.4 (cm) x 1.2 (cm) = 1.68 (cm) W): Tissue and other Viable, Non-Viable, Exudate, Fibrin/Slough, Subcutaneous material debrided: Instrument: Curette Bleeding: Minimum Hemostasis Achieved: Pressure End Time: 08:35 Procedural Pain: 0 Post Procedural Pain: 0 Response to Treatment: Procedure was tolerated well Post Debridement Measurements of  Total Wound Length: (cm) 1.4 Width: (cm) 1.2 Depth: (cm) 0.3 Volume: (cm) 0.396 Character of Wound/Ulcer Post Improved Debridement: Severity of Tissue Post Debridement: Fat layer exposed Post Procedure Diagnosis Same as Pre-procedure Electronic Signature(s) Signed: 08/09/2016 9:11:46 AM By: Christin Fudge MD, FACS Signed: 08/09/2016 4:59:47 PM By: Gretta Cool RN, BSN, Kim RN, BSN Entered By: Christin Fudge on 08/09/2016 09:11:46 [name], [name] ([id number]) [name], [name] ([id number]) -------------------------------------------------------------------------------- HPI Details Patient Name: [name] Date of Service: 08/09/2016 8:00 AM Medical Record Number: [id number] Patient Account Number: [id number] Date of Birth/Sex: [birth year] (50 y.o. Male) Treating RN: Cornell Barman Primary Care Physician: SYSTEM, PCP Other Clinician: Referring Physician: Lisa Roca Treating Physician/Extender: Frann Rider in Treatment: 7 History of Present Illness Location: right lower extremity on the lateral calf area Quality: Patient reports experiencing a sharp pain to affected area(s). Severity: Presents with a pain of ____10_on a pain scale of 1-10. Duration: Patient has had the wound for > 3 months prior to seeking treatment at the wound center Timing: Pain in wound is constant (hurts all the time) Context: The wound occurred when the patient may have scratched vigorously in his sleep because of his psoriasis Modifying Factors: Other treatment(s) tried include:takes treatment for psoriasis with local creams and UVB light Associated Signs and Symptoms: Patient reports presence of swelling HPI Description: 50 year old gentleman was seen recently in the ER last week with a chief complaints of cellulitis and skin ulcer. He has a history of CHF, Psoriasis, hypertension, diabetes mellitus and peripheral neuropathy. 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 The ulcer was on his right lateral calf and has been there for several months and become larger with time. last hemoglobin A1c was 8.3% in May. He has never been a smoker. he had an ultrasound of the right lower extremity for a DVT study and this was normal. Chest x-ray showed no active disease. He was put on clindamycin  300 mg 3 times a day for 10 days. 08/09/2016 -- not been here for 3 weeks and has been doing well and doing his dressings regularly Electronic Signature(s) Signed: 08/09/2016 9:12:43 AM By: Christin Fudge MD, FACS Entered By: Christin Fudge on 08/09/2016 09:12:43 [name], [name] ([id number]) -------------------------------------------------------------------------------- Physical Exam Details Patient Name: [name] Date of Service: 08/09/2016 8:00 AM Medical Record Number: [id number] Patient Account Number: [id number] Date of Birth/Sex: 1966/07/12 (50 y.o. Male) Treating RN: Cornell Barman Primary Care Physician: SYSTEM, PCP Other Clinician: Referring Physician: Lisa Roca Treating Physician/Extender: Frann Rider in Treatment: 7 Constitutional . Pulse regular. Respirations normal and unlabored. Afebrile. . Eyes Nonicteric. Reactive to light. Ears, Nose, Mouth, and Throat Lips, teeth, and gums WNL.[name] Kitchen Moist mucosa without lesions. Neck supple and nontender. No palpable supraclavicular or cervical adenopathy. Normal sized without goiter. Respiratory WNL. No retractions.. Cardiovascular Pedal Pulses WNL. No clubbing, cyanosis or edema. Lymphatic No adneopathy. No adenopathy. No adenopathy. Musculoskeletal Adexa without tenderness or enlargement.. Digits and nails w/o clubbing, cyanosis, infection, petechiae, ischemia, or inflammatory conditions.. Integumentary (Hair, Skin) No suspicious lesions. No crepitus or fluctuance. No  peri-wound warmth or erythema. No masses.[name] Kitchen Psychiatric Judgement and insight Intact.. No evidence of depression, anxiety, or agitation.. Notes continues to have subcutaneous debris which were sharply removed with a #5 curet and bleeding controlled with pressure Electronic Signature(s) Signed: 08/09/2016 9:13:14 AM By: Christin Fudge MD, FACS Entered By: Christin Fudge on 08/09/2016 09:13:14 [name], [name] ([id number]) -------------------------------------------------------------------------------- Physician Orders Details Patient Name: [name] Date of Service: 08/09/2016 8:00 AM Medical Record Number: [id number] Patient Account Number: [id number] Date of Birth/Sex: 1966-01-12 (50 y.o. Male) Treating RN: Cornell Barman Primary Care Physician: SYSTEM, PCP Other Clinician: Referring Physician: Lisa Roca Treating Physician/Extender: Frann Rider in Treatment: 7 Verbal / Phone Orders: Yes Clinician: Cornell Barman Read Back and Verified: Yes Diagnosis Coding Wound Cleansing Wound #1 Right,Lateral Lower Leg o Cleanse wound with mild soap and water Anesthetic Wound #1 Right,Lateral Lower Leg o Topical Lidocaine 4% cream applied to wound bed prior to debridement Primary Wound Dressing Wound #1 Right,Lateral Lower Leg o Santyl Ointment Secondary Dressing Wound #1 Right,Lateral Lower Leg o Boardered Foam Dressing - telfa island Dressing Change Frequency Wound #1 Right,Lateral Lower Leg o Change dressing every day. Follow-up Appointments Wound #1 Right,Lateral Lower Leg o Return Appointment in 1 week. Additional Orders / Instructions Wound #1 Right,Lateral Lower Leg o Increase protein intake. o Activity as tolerated Medications-please add to medication list. Wound #1 Right,Lateral Lower Leg o Santyl Enzymatic Ointment [name], [name] ([id number]) Electronic Signature(s) Signed: 08/09/2016 4:46:57 PM By: Christin Fudge MD, FACS Signed:  08/09/2016 4:59:47 PM By: Gretta Cool RN, BSN, Kim RN, BSN Entered By: Gretta Cool, RN, BSN, Kim on 08/09/2016 08:38:58 [name], [name] ([id number]) -------------------------------------------------------------------------------- Problem List Details Patient Name: [name] Date of Service: 08/09/2016 8:00 AM Medical Record Number: [id number] Patient Account Number: [id number] Date of Birth/Sex: [birth year] (50 y.o. 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 Date of Service: 08/09/2016 8:00 AM Medical Record Number: [id number] Patient Account Number: [id number] Date of Birth/Sex: May 13, 1966 (50 y.o. Male) Treating RN: Cornell Barman Primary Care Physician: SYSTEM, PCP Other Clinician: Referring Physician: Lisa Roca Treating Physician/Extender: Frann Rider in Treatment: 7 Subjective Chief Complaint Information obtained from Patient Patients presents for treatment of an open diabetic ulcer to the right lower extremity near his car for about 3 months History of Present Illness (HPI) The following HPI elements were documented for the patient's wound: Location: right lower  extremity on the lateral calf area Quality: Patient reports experiencing a sharp pain to affected area(s). Severity: Presents with a pain of ____10_on a pain scale of 1-10. Duration: Patient has had the wound for > 3 months prior to seeking treatment at the wound center Timing: Pain in wound is constant (hurts all the time) Context: The wound occurred when the patient may have scratched vigorously in his sleep because of his psoriasis Modifying Factors: Other treatment(s) tried include:takes treatment for psoriasis with local creams and UVB light Associated Signs and Symptoms: Patient reports presence of swelling 50 year old gentleman was seen recently in the ER last week with a chief complaints of cellulitis and skin ulcer. He has a history of CHF, Psoriasis, hypertension, diabetes mellitus and peripheral neuropathy. 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 The ulcer was on his right lateral calf and has been there for several months and become larger with time. last hemoglobin A1c was 8.3% in May. He has never been a smoker. he had an ultrasound of the right lower extremity for a DVT study and this was normal. Chest x-ray showed no active disease. He was put on clindamycin 300 mg 3 times a day for 10 days. 08/09/2016 -- not been here for 3 weeks and has been doing well and doing his dressings regularly Objective Constitutional Pulse regular. Respirations normal and unlabored. Afebrile. [name], [name] ([id number]) Vitals Time Taken: 8:04 AM, Height: 64 in, Weight: 274 lbs, BMI: 47, Temperature: 97.6 F, Pulse: 72 bpm, Respiratory Rate: 20 breaths/min, Blood Pressure: 161/108 mmHg. General Notes: Patient states he has not eaten breakfast so he has not taken his BP meds. He will take them once he gets home. Patient told to follow up with PCP  regarding BP. Patient understands and agreees. Eyes Nonicteric. Reactive to light. Ears, Nose, Mouth, and Throat Lips, teeth, and gums WNL.[name] Kitchen Moist mucosa without lesions. Neck supple and nontender. No palpable supraclavicular or cervical adenopathy. Normal sized without goiter. Respiratory WNL. No retractions.. Cardiovascular Pedal Pulses WNL. No clubbing, cyanosis or edema. Lymphatic No adneopathy. No adenopathy. No adenopathy. Musculoskeletal Adexa without tenderness or enlargement.. Digits and nails w/o clubbing, cyanosis, infection, petechiae, ischemia, or inflammatory conditions.[name] Kitchen Psychiatric Judgement and insight Intact.. No evidence of depression, anxiety, or agitation.. General Notes: continues to have subcutaneous debris which were sharply removed with a #5 curet and bleeding controlled with pressure Integumentary (Hair, Skin) No suspicious lesions. No crepitus or fluctuance. No peri-wound warmth or erythema. No masses.. Wound #1 status is Open. Original cause of wound was Gradually Appeared. The wound is located on the Right,Lateral Lower Leg. The wound measures 1.4cm length x 1.2cm width x 0.3cm depth; 1.319cm^2 area and 0.396cm^3 volume. The wound is limited to skin breakdown. There is no tunneling or undermining noted. There is a  none present amount of drainage noted. The wound margin is thickened. There is medium (34-66%) pink, pale granulation within the wound bed. There is a medium (34-66%) amount of necrotic tissue within the wound bed including Adherent Slough. The periwound skin appearance exhibited: Moist, Hemosiderin Staining. The periwound skin appearance did not exhibit: Callus, Crepitus, Excoriation, Fluctuance, Friable, Induration, Localized Edema, Rash, Scarring, Dry/Scaly, Maceration, Atrophie Blanche, Cyanosis, Ecchymosis, Mottled, Pallor, Rubor, Erythema. [name], [name] ([id number]) Assessment Active Problems ICD-10 E11.622 - Type 2 diabetes  mellitus with other skin ulcer L97.212 - Non-pressure chronic ulcer of right calf with fat layer exposed L40.9 - Psoriasis, unspecified E66.01 - Morbid (severe) obesity due to excess calories Procedures Wound #1 Wound #1 is a Diabetic Wound/Ulcer of the Lower Extremity located on the Right,Lateral Lower Leg . There was a Skin/Subcutaneous Tissue Debridement [id number]) debridement with total area of 1.68 sq cm performed by Christin Fudge, MD. with the following instrument(s): Curette to remove Viable and Non-Viable tissue/material including Exudate, Fibrin/Slough, and Subcutaneous after achieving pain control using Other (lidocaine 4%). A time out was conducted at 08:30, prior to the start of the procedure. A Minimum amount of bleeding was controlled with Pressure. The procedure was tolerated well with a pain level of 0 throughout and a pain level of 0 following the procedure. Post Debridement Measurements: 1.4cm length x 1.2cm width x 0.3cm depth; 0.396cm^3 volume. Character of Wound/Ulcer Post Debridement is improved. Severity of Tissue Post Debridement is: Fat layer exposed. Post procedure Diagnosis Wound #1: Same as Pre-Procedure Plan Wound Cleansing: Wound #1 Right,Lateral Lower Leg: Cleanse wound with mild soap and water Anesthetic: Wound #1 Right,Lateral Lower Leg: Topical Lidocaine 4% cream applied to wound bed prior to debridement Primary Wound Dressing: Wound #1 Right,Lateral Lower Leg: Santyl Ointment Secondary Dressing: REINIER, LIENHART. ([id number]) Wound #1 Right,Lateral Lower Leg: Boardered Foam Dressing - telfa island Dressing Change Frequency: Wound #1 Right,Lateral Lower Leg: Change dressing every day. Follow-up Appointments: Wound #1 Right,Lateral Lower Leg: Return Appointment in 1 week. Additional Orders / Instructions: Wound #1 Right,Lateral Lower Leg: Increase protein intake. Activity as tolerated Medications-please add to medication list.: Wound #1  Right,Lateral Lower Leg: Santyl Enzymatic Ointment I have recommended: 1. Washing this area with soap and water daily and scrubbing the area well 2. Santyl ointment locally to be changed daily 3. Regular visits to the wound center Electronic Signature(s) Signed: 08/09/2016 9:14:04 AM By: Christin Fudge MD, FACS Entered By: Christin Fudge on 08/09/2016 09:14:04 Ascher, Levelle Jerilynn Brown ([id number]) -------------------------------------------------------------------------------- SuperBill Details Patient Name: [name] Date of Service: 08/09/2016 Medical Record Number: [id number] Patient Account Number: [id number] Date of Birth/Sex: 1966-09-25 (50 y.o. 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 L40.9 Psoriasis, unspecified E66.01 Morbid (severe) obesity due to excess calories Facility Procedures CPT4 Code: [id number] Description: [id number] - DEB SUBQ TISSUE 20 SQ CM/< ICD-10 Description Diagnosis E11.622 Type 2 diabetes mellitus with other skin ulcer L97.212 Non-pressure chronic ulcer of right calf with fat L40.9 Psoriasis, unspecified Modifier: layer exposed Quantity: 1 Physician Procedures CPT4 Code: [id number] Description: 11042 - WC PHYS SUBQ TISS 20 SQ CM ICD-10 Description Diagnosis E11.622 Type 2 diabetes mellitus with other skin ulcer L97.212 Non-pressure chronic ulcer of right calf with fat L40.9 Psoriasis, unspecified Modifier: layer exposed  Quantity: 1 Electronic Signature(s) Signed: 08/09/2016 9:14:37 AM By: Christin Fudge MD, FACS Entered By: Christin Fudge on 08/09/2016 09:14:37

## 2016-08-10 NOTE — Progress Notes (Signed)
JAYESH, KUCHARSKI ([id number]) Visit Report for 08/09/2016 Arrival Information Details Patient Name: [name], [name] Date of Service: 08/09/2016 8:00 AM Medical Record Number: [id number] Patient Account Number: [id number] Date of Birth/Sex: Jan 17, 1966 (50 y.o. Male) Treating RN: Cornell Barman Primary Care Physician: SYSTEM, PCP Other Clinician: Referring Physician: Lisa Roca Treating Physician/Extender: Frann Rider in Treatment: 7 Visit Information History Since Last Visit Added or deleted any medications: No Patient Arrived: Ambulatory Any new allergies or adverse reactions: No Arrival Time: 08:04 Had a fall or experienced change in No Accompanied By: self activities of daily living that may affect Transfer Assistance: None risk of falls: Patient Identification Verified: Yes Signs or symptoms of abuse/neglect since last No Secondary Verification Process Yes visito Completed: Hospitalized since last visit: No Patient Requires Transmission- No Has Dressing in Place as Prescribed: Yes Based Precautions: Pain Present Now: No Patient Has Alerts: Yes Patient Alerts: Patient on Blood Thinner ASA DM2 A1c 9 Electronic Signature(s) Signed: 08/09/2016 4:59:47 PM By: Gretta Cool, RN, BSN, Kim RN, BSN Entered By: Gretta Cool, RN, BSN, Kim on 08/09/2016 08:04:41 Elahi, Nazareth Jerilynn Mages ([id number]) -------------------------------------------------------------------------------- Encounter Discharge Information Details Patient Name: [name] Date of Service: 08/09/2016 8:00 AM Medical Record Number: [id number] Patient Account Number: [id number] Date of Birth/Sex: 1966-01-16 (50 y.o. Male) Treating RN: Cornell Barman Primary Care Physician: SYSTEM, PCP Other Clinician: Referring Physician: Lisa Roca Treating Physician/Extender: Frann Rider in Treatment: 7 Encounter Discharge Information Items Discharge Pain Level: 0 Discharge Condition: Stable Ambulatory Status:  Ambulatory Discharge Destination: Home Transportation: Private Auto Accompanied By: self Schedule Follow-up Appointment: Yes Medication Reconciliation completed Yes and provided to Patient/Care Provider: Patient Clinical Summary of Care: Declined Electronic Signature(s) Signed: 08/09/2016 4:59:47 PM By: Gretta Cool RN, BSN, Kim RN, BSN Previous Signature: 08/09/2016 8:39:55 AM Version By: Ruthine Dose Entered By: Gretta Cool RN, BSN, Kim on 08/09/2016 08:41:40 Hocevar, Joseluis Jerilynn Mages ([id number]) -------------------------------------------------------------------------------- Lower Extremity Assessment Details Patient Name: [name] Date of Service: 08/09/2016 8:00 AM Medical Record Number: [id number] Patient Account Number: [id number] Date of Birth/Sex: 1966/10/30 (50 y.o. Male) Treating RN: Cornell Barman Primary Care Physician: SYSTEM, PCP Other Clinician: Referring Physician: Lisa Roca Treating Physician/Extender: Frann Rider in Treatment: 7 Vascular Assessment Claudication: Claudication Assessment [Right:None] Pulses: Posterior Tibial Dorsalis Pedis Palpable: [Right:Yes] Extremity colors, hair growth, and conditions: Extremity Color: [Right:Hyperpigmented] Hair Growth on Extremity: [Right:Yes] Temperature of Extremity: [Right:Warm] Capillary Refill: [Right:< 3 seconds] Dependent Rubor: [Right:No] Blanched when Elevated: [Right:No] Lipodermatosclerosis: [Right:No] Toe Nail Assessment Left: Right: Thick: Yes Discolored: Yes Deformed: Yes Improper Length and Hygiene: Yes Electronic Signature(s) Signed: 08/09/2016 4:59:47 PM By: Gretta Cool, RN, BSN, Kim RN, BSN Entered By: Gretta Cool, RN, BSN, Kim on 08/09/2016 08:11:26 Holsinger, Varnell Jerilynn Mages ([id number]) -------------------------------------------------------------------------------- Multi Wound Chart Details Patient Name: [name] Date of Service: 08/09/2016 8:00 AM Medical Record Number: [id number] Patient  Account Number: [id number] Date of Birth/Sex: August 02, 1966 (50 y.o. Male) Treating RN: Cornell Barman Primary Care Physician: SYSTEM, PCP Other Clinician: Referring Physician: Lisa Roca Treating Physician/Extender: Frann Rider in Treatment: 7 Vital Signs Height(in): 64 Pulse(bpm): 72 Weight(lbs): 274 Blood Pressure 161/108 (mmHg): Body Mass Index(BMI): 47 Temperature(F): 97.6 Respiratory Rate 20 (breaths/min): Photos: [N/A:N/A] Wound Location: Right Lower Leg - Lateral N/A N/A Wounding Event: Gradually Appeared N/A N/A Primary Etiology: Diabetic Wound/Ulcer of N/A N/A the Lower Extremity Comorbid History: Anemia, Sleep Apnea, N/A N/A Arrhythmia, Congestive Heart Failure, Hypertension, Peripheral Venous Disease, Type II Diabetes, Gout, Neuropathy Date Acquired: 04/23/2016 N/A N/A Weeks of Treatment: 7 N/A N/A Wound Status: Open  N/A N/A Measurements L x W x D 1.4x1.2x0.3 N/A N/A (cm) Area (cm) : 1.319 N/A N/A Volume (cm) : 0.396 N/A N/A % Reduction in Area: 25.40% N/A N/A % Reduction in Volume: 55.20% N/A N/A Classification: Grade 1 N/A N/A Exudate Amount: None Present N/A N/A Wound Margin: Thickened N/A N/A Trew, Desorbo Eliah M. ([id number]) Granulation Amount: Medium (34-66%) N/A N/A Granulation Quality: Pink, Pale N/A N/A Necrotic Amount: Medium (34-66%) N/A N/A Exposed Structures: Fascia: No N/A N/A Fat: No Tendon: No Muscle: No Joint: No Bone: No Limited to Skin Breakdown Epithelialization: None N/A N/A Periwound Skin Texture: Edema: No N/A N/A Excoriation: No Induration: No Callus: No Crepitus: No Fluctuance: No Friable: No Rash: No Scarring: No Periwound Skin Moist: Yes N/A N/A Moisture: Maceration: No Dry/Scaly: No Periwound Skin Color: Hemosiderin Staining: Yes N/A N/A Atrophie Blanche: No Cyanosis: No Ecchymosis: No Erythema: No Mottled: No Pallor: No Rubor: No Tenderness on No N/A N/A Palpation: Wound Preparation: Ulcer  Cleansing: N/A N/A Rinsed/Irrigated with Saline Topical Anesthetic Applied: Other: lidocaine 4% Treatment Notes Electronic Signature(s) Signed: 08/09/2016 4:59:47 PM By: Gretta Cool, RN, BSN, Kim RN, BSN Entered By: Gretta Cool, RN, BSN, Kim on 08/09/2016 08:14:00 Levels, Wayne Jerilynn Mages ([id number]) -------------------------------------------------------------------------------- Multi-Disciplinary Care Plan Details Patient Name: [name] Date of Service: 08/09/2016 8:00 AM Medical Record Number: [id number] Patient Account Number: [id number] Date of Birth/Sex: 12-Jul-1966 (50 y.o. Male) Treating RN: Cornell Barman Primary Care Physician: SYSTEM, PCP Other Clinician: Referring Physician: Lisa Roca Treating Physician/Extender: Frann Rider in Treatment: 7 Active Inactive Orientation to the Wound Care Program Nursing Diagnoses: Knowledge deficit related to the wound healing center program Goals: Patient/caregiver will verbalize understanding of the [facility] Date Initiated: 06/18/2016 Goal Status: Active Interventions: Provide education on orientation to the wound center Notes: Venous Leg Ulcer Nursing Diagnoses: Knowledge deficit related to disease process and management Potential for venous Insuffiency (use before diagnosis confirmed) Goals: Patient will maintain optimal edema control Date Initiated: 06/18/2016 Goal Status: Active Patient/caregiver will verbalize understanding of disease process and disease management Date Initiated: 06/18/2016 Goal Status: Active Verify adequate tissue perfusion prior to therapeutic compression application Date Initiated: 06/18/2016 Goal Status: Active Interventions: Compression as ordered Provide education on venous insufficiency Notes: KARLIN, PATRICIO ([id number]) Wound/Skin Impairment Nursing Diagnoses: Impaired tissue integrity Goals: Patient/caregiver will verbalize understanding of skin care regimen Date  Initiated: 06/18/2016 Goal Status: Active Ulcer/skin breakdown will have a volume reduction of 30% by week 4 Date Initiated: 06/18/2016 Goal Status: Active Ulcer/skin breakdown will have a volume reduction of 50% by week 8 Date Initiated: 06/18/2016 Goal Status: Active Ulcer/skin breakdown will have a volume reduction of 80% by week 12 Date Initiated: 06/18/2016 Goal Status: Active Ulcer/skin breakdown will heal within 14 weeks Date Initiated: 06/18/2016 Goal Status: Active Interventions: Assess patient/caregiver ability to obtain necessary supplies Assess patient/caregiver ability to perform ulcer/skin care regimen upon admission and as needed Assess ulceration(s) every visit Provide education on ulcer and skin care Treatment Activities: Skin care regimen initiated : 06/18/2016 Topical wound management initiated : 06/18/2016 Notes: Electronic Signature(s) Signed: 08/09/2016 4:59:47 PM By: Gretta Cool, RN, BSN, Kim RN, BSN Entered By: Gretta Cool, RN, BSN, Kim on 08/09/2016 08:13:53 Chew, Wilder Jerilynn Mages ([id number]) -------------------------------------------------------------------------------- Pain Assessment Details Patient Name: [name] Date of Service: 08/09/2016 8:00 AM Medical Record Number: [id number] Patient Account Number: [id number] Date of Birth/Sex: [birth year] (50 y.o. Male) Treating RN: Cornell Barman Primary Care Physician: SYSTEM, PCP Other Clinician: Referring Physician: Lisa Roca Treating Physician/Extender: Frann Rider in  Treatment: 7 Active Problems Location of Pain Severity and Description of Pain Patient Has Paino No Site Locations With Dressing Change: No Pain Management and Medication Current Pain Management: Electronic Signature(s) Signed: 08/09/2016 4:59:47 PM By: Gretta Cool, RN, BSN, Kim RN, BSN Entered By: Gretta Cool, RN, BSN, Kim on 08/09/2016 08:04:49 Salvucci, Rhyse Jerilynn Mages  ([id number]) -------------------------------------------------------------------------------- Patient/Caregiver Education Details Patient Name: [name] Date of Service: 08/09/2016 8:00 AM Medical Record Number: [id number] Patient Account Number: [id number] Date of Birth/Gender: Dec 11, 1966 (50 y.o. Male) Treating RN: Cornell Barman Primary Care Physician: SYSTEM, PCP Other Clinician: Referring Physician: Lisa Roca Treating Physician/Extender: Frann Rider in Treatment: 7 Education Assessment Education Provided To: Patient Education Topics Provided Wound/Skin Impairment: Handouts: Caring for Your Ulcer, Other: apply santly daily Methods: Demonstration, Explain/Verbal Responses: State content correctly Electronic Signature(s) Signed: 08/09/2016 4:59:47 PM By: Gretta Cool, RN, BSN, Kim RN, BSN Entered By: Gretta Cool, RN, BSN, Kim on 08/09/2016 08:42:07 Langelier, Kyriakos Jerilynn Mages ([id number]) -------------------------------------------------------------------------------- Wound Assessment Details Patient Name: [name] Date of Service: 08/09/2016 8:00 AM Medical Record Number: [id number] Patient Account Number: [id number] Date of Birth/Sex: 08/01/1966 (50 y.o. Male) Treating RN: Cornell Barman Primary Care Physician: SYSTEM, PCP Other Clinician: Referring Physician: Lisa Roca Treating Physician/Extender: Frann Rider in Treatment: 7 Wound Status Wound Number: 1 Primary Diabetic Wound/Ulcer of the Lower Etiology: Extremity Wound Location: Right Lower Leg - Lateral Wound Open Wounding Event: Gradually Appeared Status: Date Acquired: 04/23/2016 Comorbid Anemia, Sleep Apnea, Arrhythmia, Weeks Of Treatment: 7 History: Congestive Heart Failure, Hypertension, Clustered Wound: No Peripheral Venous Disease, Type II Diabetes, Gout, Neuropathy Photos Wound Measurements Length: (cm) 1.4 Width: (cm) 1.2 Depth: (cm) 0.3 Area: (cm) 1.319 Volume: (cm) 0.396 %  Reduction in Area: 25.4% % Reduction in Volume: 55.2% Epithelialization: None Tunneling: No Undermining: No Wound Description Classification: Grade 1 Wound Margin: Thickened Exudate Amount: None Present Wound Bed Granulation Amount: Medium (34-66%) Exposed Structure Granulation Quality: Pink, Pale, Hyper-granulation Fascia Exposed: No Necrotic Amount: Medium (34-66%) Fat Layer Exposed: No Necrotic Quality: Adherent Slough Tendon Exposed: No Muscle Exposed: No Joint Exposed: No Stejskal, Atharv M. ([id number]) Bone Exposed: No Limited to Skin Breakdown Periwound Skin Texture Texture Color No Abnormalities Noted: No No Abnormalities Noted: No Callus: No Atrophie Blanche: No Crepitus: No Cyanosis: No Excoriation: No Ecchymosis: No Fluctuance: No Erythema: No Friable: No Hemosiderin Staining: Yes Induration: No Mottled: No Localized Edema: No Pallor: No Rash: No Rubor: No Scarring: No Moisture No Abnormalities Noted: No Dry / Scaly: No Maceration: No Moist: Yes Wound Preparation Ulcer Cleansing: Rinsed/Irrigated with Saline Topical Anesthetic Applied: Other: lidocaine 4%, Treatment Notes Wound #1 (Right, Lateral Lower Leg) 1. Cleansed with: Clean wound with Normal Saline 2. Anesthetic Topical Lidocaine 4% cream to wound bed prior to debridement 4. Dressing Applied: Santyl Ointment 5. Secondary [place] Signature(s) Signed: 08/09/2016 4:59:47 PM By: Gretta Cool, RN, BSN, Kim RN, BSN Entered By: Gretta Cool, RN, BSN, Kim on 08/09/2016 08:15:19 YEIDEN, VANWHY ([id number]) -------------------------------------------------------------------------------- [place] Details Patient Name: [name] Date of Service: 08/09/2016 8:00 AM Medical Record Number: [id number] Patient Account Number: [id number] Date of Birth/Sex: 1966-06-21 (50 y.o. Male) Treating RN: Cornell Barman Primary Care Physician: SYSTEM, PCP Other  Clinician: Referring Physician: Lisa Roca Treating Physician/Extender: Frann Rider in Treatment: 7 Vital Signs Time Taken: 08:04 Temperature (F): 97.6 Height (in): 64 Pulse (bpm): 72 Weight (lbs): 274 Respiratory Rate (breaths/min): 20 Body Mass Index (BMI): 47 Blood Pressure (mmHg): 161/108 Reference Range: 80 - 120 mg / dl Notes Patient states  he has not eaten breakfast so he has not taken his BP meds. He will take them once he gets home. Patient told to follow up with PCP regarding BP. Patient understands and agreees. Electronic Signature(s) Signed: 08/09/2016 4:59:47 PM By: Gretta Cool, RN, BSN, Kim RN, BSN Entered By: Gretta Cool, RN, BSN, Kim on 08/09/2016 08:09:01

## 2016-08-16 ENCOUNTER — Encounter: Payer: BLUE CROSS/BLUE SHIELD | Admitting: Surgery

## 2016-08-16 DIAGNOSIS — E11622 Type 2 diabetes mellitus with other skin ulcer: Secondary | ICD-10-CM | POA: Diagnosis not present

## 2016-08-17 NOTE — Progress Notes (Signed)
[name], [name] ([id number]) Visit Report for 08/16/2016 Arrival Information Details Patient Name: [name], [name] Date of Service: 08/16/2016 10:00 AM Medical Record Number: [id number] Patient Account Number: [id number] Date of Birth/Sex: 06-25-1966 (50 y.o. Male) Treating RN: Montey Hora Primary Care Physician: SYSTEM, PCP Other Clinician: Referring Physician: Lisa Roca Treating Physician/Extender: Frann Rider in Treatment: 8 Visit Information History Since Last Visit Added or deleted any medications: No Patient Arrived: Ambulatory Any new allergies or adverse reactions: No Arrival Time: 10:04 Had a fall or experienced change in No Accompanied By: self activities of daily living that may affect Transfer Assistance: None risk of falls: Patient Identification Verified: Yes Signs or symptoms of abuse/neglect since last No Secondary Verification Process Yes visito Completed: Hospitalized since last visit: No Patient Requires Transmission- No Pain Present Now: No Based Precautions: Patient Has Alerts: Yes Patient Alerts: Patient on Blood Thinner Electronic Signature(s) Signed: 08/16/2016 4:52:33 PM By: Montey Hora Entered By: Montey Hora on 08/16/2016 10:05:17 [name], [name] ([id number]) -------------------------------------------------------------------------------- Clinic Level of Care Assessment Details Patient Name: [name] Date of Service: 08/16/2016 10:00 AM Medical Record Number: [id number] Patient Account Number: [id number] Date of Birth/Sex: 1966-08-16 (50 y.o. Male) Treating RN: [name], RN, BSN, [place] Primary Care Physician: SYSTEM, PCP Other Clinician: Referring Physician: Lisa Roca Treating Physician/Extender: Frann Rider in Treatment: 8 Clinic Level of Care Assessment Items TOOL 4 Quantity Score []  - Use when only an EandM is performed on FOLLOW-UP visit 0 ASSESSMENTS - Nursing Assessment /  Reassessment X - Reassessment of Co-morbidities (includes updates in patient status) 1 10 X - Reassessment of Adherence to Treatment Plan 1 5 ASSESSMENTS - Wound and Skin Assessment / Reassessment []  - Simple Wound Assessment / Reassessment - one wound 0 []  - Complex Wound Assessment / Reassessment - multiple wounds 0 []  - Dermatologic / Skin Assessment (not related to wound area) 0 ASSESSMENTS - Focused Assessment []  - Circumferential Edema Measurements - multi extremities 0 []  - Nutritional Assessment / Counseling / Intervention 0 X - Lower Extremity Assessment (monofilament, tuning fork, pulses) 1 5 []  - Peripheral Arterial Disease Assessment (using hand held doppler) 0 ASSESSMENTS - Ostomy and/or Continence Assessment and Care []  - Incontinence Assessment and Management 0 []  - Ostomy Care Assessment and Management (repouching, etc.) 0 PROCESS - Coordination of Care X - Simple Patient / Family Education for ongoing care 1 15 []  - Complex (extensive) Patient / Family Education for ongoing care 0 []  - Staff obtains Programmer, systems, Records, Test Results / Process Orders 0 []  - Staff telephones HHA, Nursing Homes / Clarify orders / etc 0 []  - Routine Transfer to another Facility (non-emergent condition) 0 [name], [name] ([id number]) []  - Routine Hospital Admission (non-emergent condition) 0 []  - New Admissions / Biomedical engineer / Ordering NPWT, Apligraf, etc. 0 []  - Emergency Hospital Admission (emergent condition) 0 []  - Simple Discharge Coordination 0 []  - Complex (extensive) Discharge Coordination 0 PROCESS - Special Needs []  - Pediatric / Minor Patient Management 0 []  - Isolation Patient Management 0 []  - Hearing / Language / Visual special needs 0 []  - Assessment of Community assistance (transportation, D/C planning, etc.) 0 []  - Additional assistance / Altered mentation 0 []  - Support Surface(s) Assessment (bed, cushion, seat, etc.) 0 INTERVENTIONS - Wound Cleansing /  Measurement X - Simple Wound Cleansing - one wound 1 5 []  - Complex Wound Cleansing - multiple wounds 0 X - Wound Imaging (photographs - any number of wounds) 1 5 []  -  Wound Tracing (instead of photographs) 0 []  - Simple Wound Measurement - one wound 0 []  - Complex Wound Measurement - multiple wounds 0 INTERVENTIONS - Wound Dressings X - Small Wound Dressing one or multiple wounds 1 10 []  - Medium Wound Dressing one or multiple wounds 0 []  - Large Wound Dressing one or multiple wounds 0 []  - Application of Medications - topical 0 []  - Application of Medications - injection 0 INTERVENTIONS - Miscellaneous []  - External ear exam 0 [name], [name] ([id number]) []  - Specimen Collection (cultures, biopsies, blood, body fluids, etc.) 0 []  - Specimen(s) / Culture(s) sent or taken to Lab for analysis 0 []  - Patient Transfer (multiple staff / Harrel Lemon Lift / Similar devices) 0 []  - Simple Staple / Suture removal (25 or less) 0 []  - Complex Staple / Suture removal (26 or more) 0 []  - Hypo / Hyperglycemic Management (close monitor of Blood Glucose) 0 []  - Ankle / Brachial Index (ABI) - do not check if billed separately 0 X - Vital Signs 1 5 Has the patient been seen at the hospital within the last three years: Yes Total Score: 60 Level Of Care: New/Established - Level 2 Electronic Signature(s) Signed: 08/16/2016 5:32:33 PM By: Regan Lemming BSN, RN Entered By: Regan Lemming on 08/16/2016 10:36:42 [name], [name] ([id number]) -------------------------------------------------------------------------------- Encounter Discharge Information Details Patient Name: [name] Date of Service: 08/16/2016 10:00 AM Medical Record Number: [id number] Patient Account Number: [id number] Date of Birth/Sex: 11-26-1966 (50 y.o. Male) Treating RN: Cornell Barman Primary Care Physician: SYSTEM, PCP Other Clinician: Referring Physician: Lisa Roca Treating Physician/Extender: Frann Rider in  Treatment: 8 Encounter Discharge Information Items Schedule Follow-up Appointment: No Medication Reconciliation completed No and provided to Patient/Care Provider: Provided on Clinical Summary of Care: 08/16/2016 Form Type Recipient Paper Patient JT Electronic Signature(s) Signed: 08/16/2016 10:45:08 AM By: Ruthine Dose Entered By: Ruthine Dose on 08/16/2016 10:45:08 [name], [name] ([id number]) -------------------------------------------------------------------------------- Lower Extremity Assessment Details Patient Name: [name] Date of Service: 08/16/2016 10:00 AM Medical Record Number: [id number] Patient Account Number: [id number] Date of Birth/Sex: January 27, 1966 (50 y.o. Male) Treating RN: Montey Hora Primary Care Physician: SYSTEM, PCP Other Clinician: Referring Physician: Lisa Roca Treating Physician/Extender: Frann Rider in Treatment: 8 Edema Assessment Assessed: [Left: No] [Right: No] Edema: [Left: Ye] [Right: s] Vascular Assessment Pulses: Posterior Tibial Dorsalis Pedis Palpable: [Right:Yes] Extremity colors, hair growth, and conditions: Extremity Color: [Right:Hyperpigmented] Hair Growth on Extremity: [Right:No] Temperature of Extremity: [Right:Warm] Capillary Refill: [Right:< 3 seconds] Electronic Signature(s) Signed: 08/16/2016 4:52:33 PM By: Montey Hora Entered By: Montey Hora on 08/16/2016 10:12:42 [place], Chinonso M. ([id number]) -------------------------------------------------------------------------------- Multi Wound Chart Details Patient Name: [name] Date of Service: 08/16/2016 10:00 AM Medical Record Number: [id number] Patient Account Number: [id number] Date of Birth/Sex: [birth year] (50 y.o. Male) Treating RN: [name], RN, BSN, [place] Primary Care Physician: SYSTEM, PCP Other Clinician: Referring Physician: Lisa Roca Treating Physician/Extender: Frann Rider in Treatment: 8 Vital  Signs Height(in): 64 Capillary Blood 156 Glucose(mg/dl): Weight(lbs): 274 Pulse(bpm): 49 Body Mass Index(BMI): 47 Blood Pressure Temperature(F): 97.8 186/106 (mmHg): Respiratory Rate 18 (breaths/min): Photos: [N/A:N/A] Wound Location: Right Lower Leg - Lateral N/A N/A Wounding Event: Gradually Appeared N/A N/A Primary Etiology: Diabetic Wound/Ulcer of N/A N/A the Lower Extremity Comorbid History: Anemia, Sleep Apnea, N/A N/A Arrhythmia, Congestive Heart Failure, Hypertension, Peripheral Venous Disease, Type II Diabetes, Gout, Neuropathy Date Acquired: 04/23/2016 N/A N/A Weeks of Treatment: 8 N/A N/A Wound Status: Open N/A N/A Measurements L x W x  D 1.3x1.1x0.1 N/A N/A (cm) Area (cm) : 1.123 N/A N/A Volume (cm) : 0.112 N/A N/A % Reduction in Area: 36.40% N/A N/A % Reduction in Volume: 87.30% N/A N/A Classification: Grade 1 N/A N/A Exudate Amount: None Present N/A N/A Wound Margin: Thickened N/A N/A [name], [name] Ediberto M. ([id number]) Granulation Amount: Medium (34-66%) N/A N/A Granulation Quality: Pink, Pale, Hyper- N/A N/A granulation Necrotic Amount: Medium (34-66%) N/A N/A Exposed Structures: Fascia: No N/A N/A Fat: No Tendon: No Muscle: No Joint: No Bone: No Limited to Skin Breakdown Epithelialization: None N/A N/A Periwound Skin Texture: Edema: No N/A N/A Excoriation: No Induration: No Callus: No Crepitus: No Fluctuance: No Friable: No Rash: No Scarring: No Periwound Skin Moist: Yes N/A N/A Moisture: Maceration: No Dry/Scaly: No Periwound Skin Color: Hemosiderin Staining: Yes N/A N/A Atrophie Blanche: No Cyanosis: No Ecchymosis: No Erythema: No Mottled: No Pallor: No Rubor: No Tenderness on No N/A N/A Palpation: Wound Preparation: Ulcer Cleansing: N/A N/A Rinsed/Irrigated with Saline Topical Anesthetic Applied: Other: lidocaine 4% Treatment Notes Electronic Signature(s) Signed: 08/16/2016 5:32:33 PM By: Regan Lemming BSN,  RN Entered By: Regan Lemming on 08/16/2016 10:34:52 [name], [name] ([id number]) [name], [name] ([id number]) -------------------------------------------------------------------------------- Multi-Disciplinary Care Plan Details Patient Name: [name] Date of Service: 08/16/2016 10:00 AM Medical Record Number: [id number] Patient Account Number: [id number] Date of Birth/Sex: 1966-08-11 (50 y.o. Male) Treating RN: [name], RN, BSN, Velva Harman Primary Care Physician: SYSTEM, PCP Other Clinician: Referring Physician: Lisa Roca Treating Physician/Extender: Frann Rider in Treatment: 8 Active Inactive Orientation to the Wound Care Program Nursing Diagnoses: Knowledge deficit related to the wound healing center program Goals: Patient/caregiver will verbalize understanding of the [facility] Program Date Initiated: 06/18/2016 Goal Status: Active Interventions: Provide education on orientation to the wound center Notes: Venous Leg Ulcer Nursing Diagnoses: Knowledge deficit related to disease process and management Potential for venous Insuffiency (use before diagnosis confirmed) Goals: Patient will maintain optimal edema control Date Initiated: 06/18/2016 Goal Status: Active Patient/caregiver will verbalize understanding of disease process and disease management Date Initiated: 06/18/2016 Goal Status: Active Verify adequate tissue perfusion prior to therapeutic compression application Date Initiated: 06/18/2016 Goal Status: Active Interventions: Compression as ordered Provide education on venous insufficiency Notes: CORDIS, CONDER ([id number]) Wound/Skin Impairment Nursing Diagnoses: Impaired tissue integrity Goals: Patient/caregiver will verbalize understanding of skin care regimen Date Initiated: 06/18/2016 Goal Status: Active Ulcer/skin breakdown will have a volume reduction of 30% by week 4 Date Initiated: 06/18/2016 Goal Status: Active Ulcer/skin  breakdown will have a volume reduction of 50% by week 8 Date Initiated: 06/18/2016 Goal Status: Active Ulcer/skin breakdown will have a volume reduction of 80% by week 12 Date Initiated: 06/18/2016 Goal Status: Active Ulcer/skin breakdown will heal within 14 weeks Date Initiated: 06/18/2016 Goal Status: Active Interventions: Assess patient/caregiver ability to obtain necessary supplies Assess patient/caregiver ability to perform ulcer/skin care regimen upon admission and as needed Assess ulceration(s) every visit Provide education on ulcer and skin care Treatment Activities: Skin care regimen initiated : 06/18/2016 Topical wound management initiated : 06/18/2016 Notes: Electronic Signature(s) Signed: 08/16/2016 5:32:33 PM By: Regan Lemming BSN, RN Entered By: Regan Lemming on 08/16/2016 10:34:39 [name], [name] ([id number]) -------------------------------------------------------------------------------- Pain Assessment Details Patient Name: [name] Date of Service: 08/16/2016 10:00 AM Medical Record Number: [id number] Patient Account Number: [id number] Date of Birth/Sex: 03-30-1966 (50 y.o. Male) Treating RN: Montey Hora Primary Care Physician: SYSTEM, PCP Other Clinician: Referring Physician: Lisa Roca Treating Physician/Extender: Frann Rider in Treatment: 8 Active Problems Location of Pain  Severity and Description of Pain Patient Has Paino No Site Locations Pain Management and Medication Current Pain Management: Notes Topical or injectable lidocaine is offered to patient for acute pain when surgical debridement is performed. If needed, Patient is instructed to use over the counter pain medication for the following 24-48 hours after debridement. Wound care MDs do not prescribed pain medications. Patient has chronic pain or uncontrolled pain. Patient has been instructed to make an appointment with their Primary Care Physician for pain management. Electronic  Signature(s) Signed: 08/16/2016 4:52:33 PM By: Montey Hora Entered By: Montey Hora on 08/16/2016 10:05:47 [name], [name] ([id number]) -------------------------------------------------------------------------------- Wound Assessment Details Patient Name: [name] Date of Service: 08/16/2016 10:00 AM Medical Record Number: [id number] Patient Account Number: [id number] Date of Birth/Sex: 1966/08/26 (50 y.o. Male) Treating RN: Montey Hora Primary Care Physician: SYSTEM, PCP Other Clinician: Referring Physician: Lisa Roca Treating Physician/Extender: Frann Rider in Treatment: 8 Wound Status Wound Number: 1 Primary Diabetic Wound/Ulcer of the Lower Etiology: Extremity Wound Location: Right Lower Leg - Lateral Wound Open Wounding Event: Gradually Appeared Status: Date Acquired: 04/23/2016 Comorbid Anemia, Sleep Apnea, Arrhythmia, Weeks Of Treatment: 8 History: Congestive Heart Failure, Hypertension, Clustered Wound: No Peripheral Venous Disease, Type II Diabetes, Gout, Neuropathy Photos Wound Measurements Length: (cm) 1.3 Width: (cm) 1.1 Depth: (cm) 0.1 Area: (cm) 1.123 Volume: (cm) 0.112 % Reduction in Area: 36.4% % Reduction in Volume: 87.3% Epithelialization: None Tunneling: No Undermining: No Wound Description Classification: Grade 1 Wound Margin: Thickened Exudate Amount: None Present Wound Bed Granulation Amount: Medium (34-66%) Exposed Structure Granulation Quality: Pink, Pale, Hyper-granulation Fascia Exposed: No Necrotic Amount: Medium (34-66%) Fat Layer Exposed: No Necrotic Quality: Adherent Slough Tendon Exposed: No Muscle Exposed: No Joint Exposed: No [name], [name] ([id number]) Bone Exposed: No Limited to Skin Breakdown Periwound Skin Texture Texture Color No Abnormalities Noted: No No Abnormalities Noted: No Callus: No Atrophie Blanche: No Crepitus: No Cyanosis: No Excoriation: No Ecchymosis:  No Fluctuance: No Erythema: No Friable: No Hemosiderin Staining: Yes Induration: No Mottled: No Localized Edema: No Pallor: No Rash: No Rubor: No Scarring: No Moisture No Abnormalities Noted: No Dry / Scaly: No Maceration: No Moist: Yes Wound Preparation Ulcer Cleansing: Rinsed/Irrigated with Saline Topical Anesthetic Applied: Other: lidocaine 4%, Electronic Signature(s) Signed: 08/16/2016 4:52:33 PM By: Montey Hora Entered By: Montey Hora on 08/16/2016 10:12:13 [name], [name] ([id number]) -------------------------------------------------------------------------------- Vitals Details Patient Name: [name] Date of Service: 08/16/2016 10:00 AM Medical Record Number: [id number] Patient Account Number: [id number] Date of Birth/Sex: January 02, 1966 (50 y.o. Male) Treating RN: Montey Hora Primary Care Physician: SYSTEM, PCP Other Clinician: Referring Physician: Lisa Roca Treating Physician/Extender: Frann Rider in Treatment: 8 Vital Signs Time Taken: 10:06 Temperature (F): 97.8 Height (in): 64 Pulse (bpm): 77 Weight (lbs): 274 Respiratory Rate (breaths/min): 18 Body Mass Index (BMI): 47 Blood Pressure (mmHg): 186/106 Capillary Blood Glucose (mg/dl): 156 Reference Range: 80 - 120 mg / dl Electronic Signature(s) Signed: 08/16/2016 4:52:33 PM By: Montey Hora Entered By: Montey Hora on 08/16/2016 10:08:55

## 2016-08-17 NOTE — Progress Notes (Signed)
[name] [name] ([id number]) Visit Report for 08/16/2016 Chief Complaint Document Details [name] [name] 08/16/2016 10:00 Patient Name: Date of Service: [name] Medical Record Patient Account Number: [id number] Number: Treating RN: [name] [name] Date of Birth/Sex: [birth year] (50 y.o. Male) Other Clinician: Primary Care Physician: SYSTEM, PCP Treating [name] [name] Referring Physician: Lisa [name] Physician/Extender: [name] [name] in [name]: 8 Information Obtained from: Patient Chief Complaint Patients presents for [name] of an open diabetic ulcer to the right lower extremity near his car for about 3 months Electronic Signature(s) Signed: 08/16/2016 10:53:47 AM By: [name] Fudge MD, FACS Entered By: [name] [name] on 08/16/2016 10:53:47 [name] [name] [name] -------------------------------------------------------------------------------- HPI Details [name] [name] 08/16/2016 10:00 Patient Name: Date of Service: [name] Medical Record Patient Account Number: [id number] Number: Treating RN: [name] [name] Date of Birth/Sex: Aug 25, 1966 (50 y.o. Male) Other Clinician: Primary Care Physician: SYSTEM, PCP Treating [name] [name] Referring Physician: Lisa [name] Physician/Extender: [name] [name] in [name]: 8 History of Present Illness Location: right lower extremity on the lateral calf area Quality: Patient reports experiencing a sharp pain to affected area(s). Severity: Presents with a pain of ____10_on a pain scale of 1-10. Duration: Patient has had the wound for > 3 months prior to seeking [name] at the wound center Timing: Pain in wound is constant (hurts all the time) Context: The wound occurred when the patient may have scratched vigorously in his sleep because of his psoriasis Modifying Factors: Other [name](s) tried include:takes [name] for psoriasis with local creams and UVB light Associated Signs and Symptoms: Patient reports presence  of swelling HPI Description: 49 year old gentleman was seen recently in the ER last week with a chief complaints of cellulitis and skin ulcer. He has a history of CHF, Psoriasis, hypertension, diabetes mellitus and peripheral neuropathy. 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 The ulcer was on his right lateral calf and has been there for several months and become larger with time. last hemoglobin A1c was 8.3% in May. He has never been a smoker. he had an ultrasound of the right lower extremity for a DVT study and this was normal. Chest x-ray showed no active disease. He was put on clindamycin 300 mg 3 times a day for 10 days. 08/09/2016 -- not been here for 3 [name] and has been doing well and doing his dressings regularly Electronic Signature(s) Signed: 08/16/2016 10:53:52 AM By: [name] Fudge MD, FACS Entered By: [name] [name] on 08/16/2016 10:53:52 [name] [name] -------------------------------------------------------------------------------- Physical Exam Details [name] [name] 08/16/2016 10:00 Patient Name: Date of Service: [name] Medical Record Patient Account Number: [id number] Number: Treating RN: [name] [name] Date of Birth/Sex: 07-01-1966 (50 y.o. Male) Other Clinician: Primary Care Physician: SYSTEM, PCP Treating [name] [name] Referring Physician: Lisa [name] Physician/Extender: [name] [name]: 8 Constitutional . Pulse regular. Respirations normal and unlabored. Afebrile. . Eyes Nonicteric. Reactive to light. Ears, Nose, Mouth, and Throat Lips, teeth, and gums WNL.[name] Kitchen Moist mucosa without lesions. Neck supple and nontender. No palpable supraclavicular or cervical adenopathy. Normal sized without goiter. Respiratory WNL. No retractions.. Breath sounds WNL, No rubs, rales, rhonchi, or  wheeze.. Cardiovascular Heart rhythm and rate regular, no murmur or gallop.. Pedal Pulses WNL. No clubbing, cyanosis or edema. Lymphatic No adneopathy. No adenopathy. No adenopathy. Musculoskeletal Adexa without tenderness or enlargement.. Digits and nails w/o clubbing, cyanosis, infection, petechiae, ischemia, or inflammatory conditions.. Integumentary (Hair, Skin) No suspicious lesions. No crepitus or fluctuance.  No peri-wound warmth or erythema. No masses.[name] Kitchen Psychiatric Judgement and insight Intact.. No evidence of depression, anxiety, or agitation.. Notes the subcutaneous taper he was washed out with moist saline gauze and no curettage was required today. Electronic Signature(s) Signed: 08/16/2016 10:54:19 AM By: [name] Fudge MD, FACS Entered By: [name] [name] on 08/16/2016 10:54:18 [name] [name] Kitchen ([id number]) -------------------------------------------------------------------------------- Physician Orders Details [name] [name] 08/16/2016 10:00 Patient Name: Date of Service: [name] Medical Record Patient Account Number: [id number] Number: Treating RN: [name], RN, BSN, Velva Harman Date of Birth/Sex: [birth year] (50 y.o. Male) Other Clinician: Primary Care Physician: SYSTEM, PCP Treating Britto, [name] Referring Physician: Lisa [name] Physician/Extender: [name] [name] in [name]: 8 Verbal / Phone Orders: Yes Clinician: [name], RN, BSN, [name] Read Back and Verified: Yes Diagnosis Coding Wound Cleansing Wound #1 Right,Lateral Lower Leg o Cleanse wound with mild soap and water Anesthetic Wound #1 Right,Lateral Lower Leg o Topical Lidocaine 4% cream applied to wound bed prior to debridement Primary Wound Dressing Wound #1 Right,Lateral Lower Leg o Aquacel Ag Secondary Dressing Wound #1 Right,Lateral Lower Leg o Boardered Foam Dressing - telfa island Dressing Change Frequency Wound #1 Right,Lateral Lower Leg o Change dressing every day. Follow-up  Appointments Wound #1 Right,Lateral Lower Leg o Return Appointment in 1 week. Additional Orders / Instructions Wound #1 Right,Lateral Lower Leg o Increase protein intake. o Activity as tolerated Electronic Signature(s) Signed: 08/16/2016 4:38:34 PM By: [name] Fudge MD, FACS Signed: 08/16/2016 5:32:33 PM By: Regan Lemming BSN, RN [name] [name] [name] Entered By: Regan Lemming on 08/16/2016 10:36:09 [name] [name] [name] -------------------------------------------------------------------------------- Problem List Details [name] [name] 08/16/2016 10:00 Patient Name: Date of Service: [name] Medical Record Patient Account Number: [id number] Number: Treating RN: [name] [name] Date of Birth/Sex: [birth year] (50 y.o. 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 -------------------------------------------------------------------------------- Progress Note Details [name] [name] 08/16/2016 10:00 Patient Name: Date of Service: [name] Medical Record Patient Account Number: [id number] Number: Treating RN: [name] [name] Date of Birth/Sex: 12/13/1966 (50 y.o. Male) Other Clinician: Primary Care Physician: SYSTEM, PCP Treating Britto, [name] Referring Physician: Lisa [name] Physician/Extender: [name] [name]  in [name]: 8 Subjective Chief Complaint Information obtained from Patient Patients presents for [name] of an open diabetic ulcer to the right lower extremity near his car for about 3 months History of Present Illness (HPI) The following HPI elements were documented for the patient's wound: Location: right lower extremity on the lateral calf area Quality: Patient reports experiencing a sharp pain to affected area(s). Severity: Presents with a pain of ____10_on a pain scale of 1-10. Duration: Patient has had the wound for > 3 months prior to seeking [name] at the wound center Timing: Pain in wound is constant (hurts all the time) Context: The wound occurred when the patient may have scratched vigorously in his sleep because of his psoriasis Modifying Factors: Other [name](s) tried include:takes [name] for psoriasis with local creams and UVB light Associated Signs and Symptoms: Patient reports presence of swelling 50 year old gentleman was seen recently in the  ER last week with a chief complaints of cellulitis and skin ulcer. He has a history of CHF, Psoriasis, hypertension, diabetes mellitus and peripheral neuropathy. 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 The ulcer was on his right lateral calf and has been there for several months and become larger with time. last hemoglobin A1c was 8.3% in May. He has never been a smoker. he had an ultrasound of the right lower extremity for a DVT study and this was normal. Chest x-ray showed no active disease. He was put on clindamycin 300 mg 3 times a day for 10 days. 08/09/2016 -- not been here for 3 [name] and has been doing well and doing his dressings regularly Objective [name] [name] [name] Constitutional Pulse regular. Respirations normal and unlabored. Afebrile. Vitals Time  Taken: 10:06 AM, Height: 64 in, Weight: 274 lbs, BMI: 47, Temperature: 97.8 F, Pulse: 77 bpm, Respiratory Rate: 18 breaths/min, Blood Pressure: 186/106 mmHg, Capillary Blood Glucose: 156 mg/dl. Eyes Nonicteric. Reactive to light. Ears, Nose, Mouth, and Throat Lips, teeth, and gums WNL.[name] Kitchen Moist mucosa without lesions. Neck supple and nontender. No palpable supraclavicular or cervical adenopathy. Normal sized without goiter. Respiratory WNL. No retractions.. Breath sounds WNL, No rubs, rales, rhonchi, or wheeze.. Cardiovascular Heart rhythm and rate regular, no murmur or gallop.. Pedal Pulses WNL. No clubbing, cyanosis or edema. Lymphatic No adneopathy. No adenopathy. No adenopathy. Musculoskeletal Adexa without tenderness or enlargement.. Digits and nails w/o clubbing, cyanosis, infection, petechiae, ischemia, or inflammatory conditions.[name] Kitchen Psychiatric Judgement and insight Intact.. No evidence of depression, anxiety, or agitation.. General Notes: the subcutaneous taper he was washed out with moist saline gauze and no curettage was required today. Integumentary (Hair, Skin) No suspicious lesions. No crepitus or fluctuance. No peri-wound warmth or erythema. No masses.. Wound #1 status is Open. Original cause of wound was Gradually Appeared. The wound is located on the Right,Lateral Lower Leg. The wound measures 1.3cm length x 1.1cm width x 0.1cm depth; 1.123cm^2 area and 0.112cm^3 volume. The wound is limited to skin breakdown. There is no tunneling or undermining noted. There is a none present amount of drainage noted. The wound margin is thickened. There is medium (34-66%) pink, pale granulation within the wound bed. There is a medium (34-66%) amount of necrotic tissue within the wound bed including Adherent Slough. The periwound skin appearance exhibited: Moist, Hemosiderin Staining. The periwound skin appearance did not exhibit: Callus, Crepitus, Excoriation, Fluctuance, Friable,  Induration, Localized Edema, Rash, Scarring, Dry/Scaly, Maceration, Atrophie Blanche, Cyanosis, Ecchymosis, Mottled, Pallor, Rubor, Erythema. HEAROLD, [name] [name] ([id number]) Assessment Active Problems ICD-10 E11.622 - Type 2 diabetes mellitus with other skin ulcer L97.212 - Non-pressure chronic ulcer of right calf with fat layer exposed L40.9 - Psoriasis, unspecified E66.01 - Morbid (severe) obesity due to excess calories I have asked him to change over to Silver alginate to be done on alternate days and continue with elevation and exercise. he is urged to keep his diabetes and hypertension under control Plan Wound Cleansing: Wound #1 Right,Lateral Lower Leg: Cleanse wound with mild soap and water Anesthetic: Wound #1 Right,Lateral Lower Leg: Topical Lidocaine 4% cream applied to wound bed prior to debridement Primary Wound Dressing: Wound #1 Right,Lateral Lower Leg: Aquacel Ag Secondary Dressing: Wound #1 Right,Lateral Lower Leg: Boardered Foam Dressing - telfa island Dressing Change Frequency: Wound #1 Right,Lateral Lower Leg: Change dressing every day. Follow-up Appointments: Wound #1 Right,Lateral Lower Leg: Return  Appointment in 1 week. Additional Orders / Instructions: Wound #1 Right,Lateral Lower Leg: Increase protein intake. Activity as tolerated [name] [name] [name] I have asked him to change over to Silver alginate to be done on alternate days and continue with elevation and exercise. he is urged to keep his diabetes and hypertension under control Electronic Signature(s) Signed: 08/16/2016 10:55:17 AM By: [name] Fudge MD, FACS Entered By: [name] [name] on 08/16/2016 10:55:17 Weger, [name] Jerilynn Mages ([id number]) -------------------------------------------------------------------------------- SuperBill Details Patient Name: [name] [name] Date of Service: 08/16/2016 Medical Record Number: [id number] Patient Account Number: [id number] Date of  Birth/Sex: Jan 23, 1966 (50 y.o. 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 L40.9 Psoriasis, unspecified E66.01 Morbid (severe) obesity due to excess calories Facility Procedures CPT4 Code: [id number] Description: [phone] - WOUND CARE VISIT-LEV 2 EST PT Modifier: Quantity: 1 Physician Procedures CPT4 Code: [id number] Description: [id number] - WC PHYS LEVEL 3 - EST PT ICD-10 Description Diagnosis E11.622 Type 2 diabetes mellitus with other skin ulcer L97.212 Non-pressure chronic ulcer of right calf with fat L40.9 Psoriasis, unspecified E66.01 Morbid (severe) obesity due to  excess calories Modifier: layer exposed Quantity: 1 Electronic Signature(s) Signed: 08/16/2016 10:55:40 AM By: [name] Fudge MD, FACS Entered By: [name] [name] on 08/16/2016 10:55:39

## 2016-08-24 ENCOUNTER — Encounter: Payer: BLUE CROSS/BLUE SHIELD | Attending: Surgery | Admitting: Surgery

## 2016-08-24 DIAGNOSIS — F419 Anxiety disorder, unspecified: Secondary | ICD-10-CM | POA: Insufficient documentation

## 2016-08-24 DIAGNOSIS — I5042 Chronic combined systolic (congestive) and diastolic (congestive) heart failure: Secondary | ICD-10-CM | POA: Insufficient documentation

## 2016-08-24 DIAGNOSIS — L409 Psoriasis, unspecified: Secondary | ICD-10-CM | POA: Diagnosis not present

## 2016-08-24 DIAGNOSIS — E11622 Type 2 diabetes mellitus with other skin ulcer: Secondary | ICD-10-CM | POA: Diagnosis not present

## 2016-08-24 DIAGNOSIS — E1140 Type 2 diabetes mellitus with diabetic neuropathy, unspecified: Secondary | ICD-10-CM | POA: Diagnosis not present

## 2016-08-24 DIAGNOSIS — L97212 Non-pressure chronic ulcer of right calf with fat layer exposed: Secondary | ICD-10-CM | POA: Diagnosis not present

## 2016-08-24 DIAGNOSIS — Z6841 Body Mass Index (BMI) 40.0 and over, adult: Secondary | ICD-10-CM | POA: Insufficient documentation

## 2016-08-24 DIAGNOSIS — G4733 Obstructive sleep apnea (adult) (pediatric): Secondary | ICD-10-CM | POA: Insufficient documentation

## 2016-08-24 DIAGNOSIS — I110 Hypertensive heart disease with heart failure: Secondary | ICD-10-CM | POA: Insufficient documentation

## 2016-08-24 DIAGNOSIS — M109 Gout, unspecified: Secondary | ICD-10-CM | POA: Insufficient documentation

## 2016-08-25 NOTE — Progress Notes (Signed)
[name], [name] ([id number]) Visit Report for 08/24/2016 Arrival Information Details Patient Name: [name], [name] Date of Service: 08/24/2016 10:00 AM Medical Record Number: [id number] Patient Account Number: [id number] Date of Birth/Sex: 10-22-1966 (50 y.o. Male) Treating RN: [name], RN, BSN, [place] Primary Care Physician: SYSTEM, PCP Other Clinician: Referring Physician: Lisa Roca Treating Physician/Extender: Frann Rider in Treatment: 9 Visit Information History Since Last Visit All ordered tests and consults were completed: No Patient Arrived: Ambulatory Added or deleted any medications: No Arrival Time: 10:04 Any new allergies or adverse reactions: No Accompanied By: self Had a fall or experienced change in No Transfer Assistance: None activities of daily living that may affect Patient Identification Verified: Yes risk of falls: Secondary Verification Process Yes Signs or symptoms of abuse/neglect since last No Completed: visito Patient Requires Transmission- No Hospitalized since last visit: No Based Precautions: Has Dressing in Place as Prescribed: Yes Patient Has Alerts: Yes Pain Present Now: No Patient Alerts: Patient on Blood Thinner Electronic Signature(s) Signed: 08/24/2016 2:52:21 PM By: Regan Lemming BSN, RN Entered By: Regan Lemming on 08/24/2016 10:05:56 Brown, [name] ([id number]) -------------------------------------------------------------------------------- Clinic Level of Care Assessment Details Patient Name: [name] Date of Service: 08/24/2016 10:00 AM Medical Record Number: [id number] Patient Account Number: [id number] Date of Birth/Sex: 1965/12/28 (50 y.o. Male) Treating RN: [name], RN, BSN, [place] Primary Care Physician: SYSTEM, PCP Other Clinician: Referring Physician: Lisa Roca Treating Physician/Extender: Frann Rider in Treatment: 9 Clinic Level of Care Assessment Items TOOL 4 Quantity Score []  - Use when  only an EandM is performed on FOLLOW-UP visit 0 ASSESSMENTS - Nursing Assessment / Reassessment X - Reassessment of Co-morbidities (includes updates in patient status) 1 10 X - Reassessment of Adherence to Treatment Plan 1 5 ASSESSMENTS - Wound and Skin Assessment / Reassessment X - Simple Wound Assessment / Reassessment - one wound 1 5 []  - Complex Wound Assessment / Reassessment - multiple wounds 0 []  - Dermatologic / Skin Assessment (not related to wound area) 0 ASSESSMENTS - Focused Assessment []  - Circumferential Edema Measurements - multi extremities 0 []  - Nutritional Assessment / Counseling / Intervention 0 X - Lower Extremity Assessment (monofilament, tuning fork, pulses) 1 5 []  - Peripheral Arterial Disease Assessment (using hand held doppler) 0 ASSESSMENTS - Ostomy and/or Continence Assessment and Care []  - Incontinence Assessment and Management 0 []  - Ostomy Care Assessment and Management (repouching, etc.) 0 PROCESS - Coordination of Care X - Simple Patient / Family Education for ongoing care 1 15 []  - Complex (extensive) Patient / Family Education for ongoing care 0 []  - Staff obtains Programmer, systems, Records, Test Results / Process Orders 0 []  - Staff telephones HHA, Nursing Homes / Clarify orders / etc 0 []  - Routine Transfer to another Facility (non-emergent condition) 0 Brown, [name] ([id number]) []  - Routine Hospital Admission (non-emergent condition) 0 []  - New Admissions / Biomedical engineer / Ordering NPWT, Apligraf, etc. 0 []  - Emergency Hospital Admission (emergent condition) 0 []  - Simple Discharge Coordination 0 []  - Complex (extensive) Discharge Coordination 0 PROCESS - Special Needs []  - Pediatric / Minor Patient Management 0 []  - Isolation Patient Management 0 []  - Hearing / Language / Visual special needs 0 []  - Assessment of Community assistance (transportation, D/C planning, etc.) 0 []  - Additional assistance / Altered mentation 0 []  - Support  Surface(s) Assessment (bed, cushion, seat, etc.) 0 INTERVENTIONS - Wound Cleansing / Measurement X - Simple Wound Cleansing - one wound 1 5 []  - Complex  Wound Cleansing - multiple wounds 0 X - Wound Imaging (photographs - any number of wounds) 1 5 []  - Wound Tracing (instead of photographs) 0 X - Simple Wound Measurement - one wound 1 5 []  - Complex Wound Measurement - multiple wounds 0 INTERVENTIONS - Wound Dressings X - Small Wound Dressing one or multiple wounds 1 10 []  - Medium Wound Dressing one or multiple wounds 0 []  - Large Wound Dressing one or multiple wounds 0 []  - Application of Medications - topical 0 []  - Application of Medications - injection 0 INTERVENTIONS - Miscellaneous []  - External ear exam 0 Brown, [name] ([id number]) []  - Specimen Collection (cultures, biopsies, blood, body fluids, etc.) 0 []  - Specimen(s) / Culture(s) sent or taken to Lab for analysis 0 []  - Patient Transfer (multiple staff / Harrel Lemon Lift / Similar devices) 0 []  - Simple Staple / Suture removal (25 or less) 0 []  - Complex Staple / Suture removal (26 or more) 0 []  - Hypo / Hyperglycemic Management (close monitor of Blood Glucose) 0 []  - Ankle / Brachial Index (ABI) - do not check if billed separately 0 X - Vital Signs 1 5 Has the patient been seen at the hospital within the last three years: Yes Total Score: 70 Level Of Care: New/Established - Level 2 Electronic Signature(s) Signed: 08/24/2016 2:52:21 PM By: Regan Lemming BSN, RN Entered By: Regan Lemming on 08/24/2016 10:19:03 Gamino, [name] ([id number]) -------------------------------------------------------------------------------- Encounter Discharge Information Details Patient Name: [name] Date of Service: 08/24/2016 10:00 AM Medical Record Number: [id number] Patient Account Number: [id number] Date of Birth/Sex: 20-Dec-1965 (50 y.o. Male) Treating RN: [name], RN, BSN, [place] Primary Care Physician: SYSTEM, PCP Other  Clinician: Referring Physician: Lisa Roca Treating Physician/Extender: Frann Rider in Treatment: 9 Encounter Discharge Information Items Discharge Pain Level: 0 Discharge Condition: Stable Ambulatory Status: Ambulatory Discharge Destination: Home Transportation: Private Auto Accompanied By: self Schedule Follow-up Appointment: No Medication Reconciliation completed No and provided to Patient/Care Provider: Patient Clinical Summary of Care: Declined Electronic Signature(s) Signed: 08/24/2016 10:22:59 AM By: Ruthine Dose Entered By: Ruthine Dose on 08/24/2016 10:22:58 Ostrow, [name] ([id number]) -------------------------------------------------------------------------------- Lower Extremity Assessment Details Patient Name: [name] Date of Service: 08/24/2016 10:00 AM Medical Record Number: [id number] Patient Account Number: [id number] Date of Birth/Sex: [birth year] (50 y.o. Male) Treating RN: [name], RN, BSN, [place] Primary Care Physician: SYSTEM, PCP Other Clinician: Referring Physician: Lisa Roca Treating Physician/Extender: Frann Rider in Treatment: 9 Vascular Assessment Pulses: Posterior Tibial Dorsalis Pedis Palpable: [Right:Yes] Extremity colors, hair growth, and conditions: Extremity Color: [Right:Mottled] Hair Growth on Extremity: [Right:Yes] Temperature of Extremity: [Right:Warm] Capillary Refill: [Right:< 3 seconds] Electronic Signature(s) Signed: 08/24/2016 2:52:21 PM By: Regan Lemming BSN, RN Entered By: Regan Lemming on 08/24/2016 10:07:35 [name], [name] ([id number]) -------------------------------------------------------------------------------- Multi Wound Chart Details Patient Name: [name] Date of Service: 08/24/2016 10:00 AM Medical Record Number: [id number] Patient Account Number: [id number] Date of Birth/Sex: Jun 09, 1966 (50 y.o. Male) Treating RN: [name], RN, BSN, [place] Primary Care Physician: SYSTEM,  PCP Other Clinician: Referring Physician: Lisa Roca Treating Physician/Extender: Frann Rider in Treatment: 9 Vital Signs Height(in): 64 Pulse(bpm): 86 Weight(lbs): 274 Blood Pressure 143/83 (mmHg): Body Mass Index(BMI): 47 Temperature(F): 98.3 Respiratory Rate 18 (breaths/min): Photos: [1:No Photos] [N/A:N/A] Wound Location: [1:Right Lower Leg - Lateral] [N/A:N/A] Wounding Event: [1:Gradually Appeared] [N/A:N/A] Primary Etiology: [1:Diabetic Wound/Ulcer of the Lower Extremity] [N/A:N/A] Comorbid History: [1:Anemia, Sleep Apnea, Arrhythmia, Congestive Heart Failure, Hypertension, Peripheral Venous Disease, Type II Diabetes, Gout, Neuropathy] [N/A:N/A]  Date Acquired: [1:04/23/2016] [N/A:N/A] Weeks of Treatment: [1:9] [N/A:N/A] Wound Status: [1:Open] [N/A:N/A] Measurements L x W x D 1x1x0.1 [N/A:N/A] (cm) Area (cm) : [1:0.785] [N/A:N/A] Volume (cm) : [1:0.079] [N/A:N/A] % Reduction in Area: [1:55.60%] [N/A:N/A] % Reduction in Volume: 91.10% [N/A:N/A] Classification: [1:Grade 1] [N/A:N/A] Exudate Amount: [1:Small] [N/A:N/A] Wound Margin: [1:Thickened] [N/A:N/A] Granulation Amount: [1:Large (67-100%)] [N/A:N/A] Granulation Quality: [1:Pink, Pale, Hyper- granulation] [N/A:N/A] Necrotic Amount: [1:Small (1-33%)] [N/A:N/A] Exposed Structures: [1:Fascia: No Fat: No] [N/A:N/A] Tendon: No Muscle: No Joint: No Bone: No Limited to Skin Breakdown Epithelialization: Small (1-33%) N/A N/A Periwound Skin Texture: Rash: Yes N/A N/A Scarring: Yes Edema: No Excoriation: No Induration: No Callus: No Crepitus: No Fluctuance: No Friable: No Periwound Skin Moist: Yes N/A N/A Moisture: Maceration: No Dry/Scaly: No Periwound Skin Color: Hemosiderin Staining: Yes N/A N/A Atrophie Blanche: No Cyanosis: No Ecchymosis: No Erythema: No Mottled: No Pallor: No Rubor: No Temperature: No Abnormality N/A N/A Tenderness on Yes N/A N/A Palpation: Wound  Preparation: Ulcer Cleansing: N/A N/A Rinsed/Irrigated with Saline Topical Anesthetic Applied: Other: lidocaine 4% Treatment Notes Electronic Signature(s) Signed: 08/24/2016 2:52:21 PM By: Regan Lemming BSN, RN Entered By: Regan Lemming on 08/24/2016 10:14:57 Longhi, [name] ([id number]) -------------------------------------------------------------------------------- Multi-Disciplinary Care Plan Details Patient Name: [name] Date of Service: 08/24/2016 10:00 AM Medical Record Number: [id number] Patient Account Number: [id number] Date of Birth/Sex: Oct 22, 1966 (50 y.o. Male) Treating RN: [name], RN, BSN, [place] Har[place] Primary Care Physician: SYSTEM, PCP Other Clinician: Referring Physician: Lisa Roca Treating Physician/Extender: Frann Rider in Treatment: 9 Active Inactive Orientation to the Wound Care Program Nursing Diagnoses: Knowledge deficit related to the wound healing center program Goals: Patient/caregiver will verbalize understanding of the [facility] Date Initiated: 06/18/2016 Goal Status: Active Interventions: Provide education on orientation to the wound center Notes: Venous Leg Ulcer Nursing Diagnoses: Knowledge deficit related to disease process and management Potential for venous Insuffiency (use before diagnosis confirmed) Goals: Patient will maintain optimal edema control Date Initiated: 06/18/2016 Goal Status: Active Patient/caregiver will verbalize understanding of disease process and disease management Date Initiated: 06/18/2016 Goal Status: Active Verify adequate tissue perfusion prior to therapeutic compression application Date Initiated: 06/18/2016 Goal Status: Active Interventions: Compression as ordered Provide education on venous insufficiency Notes: [name], [name] ([id number]) Wound/Skin Impairment Nursing Diagnoses: Impaired tissue integrity Goals: Patient/caregiver will verbalize understanding of skin care  regimen Date Initiated: 06/18/2016 Goal Status: Active Ulcer/skin breakdown will have a volume reduction of 30% by week 4 Date Initiated: 06/18/2016 Goal Status: Active Ulcer/skin breakdown will have a volume reduction of 50% by week 8 Date Initiated: 06/18/2016 Goal Status: Active Ulcer/skin breakdown will have a volume reduction of 80% by week 12 Date Initiated: 06/18/2016 Goal Status: Active Ulcer/skin breakdown will heal within 14 weeks Date Initiated: 06/18/2016 Goal Status: Active Interventions: Assess patient/caregiver ability to obtain necessary supplies Assess patient/caregiver ability to perform ulcer/skin care regimen upon admission and as needed Assess ulceration(s) every visit Provide education on ulcer and skin care Treatment Activities: Skin care regimen initiated : 06/18/2016 Topical wound management initiated : 06/18/2016 Notes: Electronic Signature(s) Signed: 08/24/2016 2:52:21 PM By: Regan Lemming BSN, RN Entered By: Regan Lemming on 08/24/2016 10:11:42 [name], [name] ([id number]) -------------------------------------------------------------------------------- Pain Assessment Details Patient Name: [name] Date of Service: 08/24/2016 10:00 AM Medical Record Number: [id number] Patient Account Number: [id number] Date of Birth/Sex: 04/21/1966 (50 y.o. Male) Treating RN: [name], RN, BSN, [place] Primary Care Physician: SYSTEM, PCP Other Clinician: Referring Physician: Lisa Roca Treating Physician/Extender: Frann Rider in Treatment: 9  Active Problems Location of Pain Severity and Description of Pain Patient Has Paino No Site Locations With Dressing Change: No Pain Management and Medication Current Pain Management: Electronic Signature(s) Signed: 08/24/2016 2:52:21 PM By: Regan Lemming BSN, RN Entered By: Regan Lemming on 08/24/2016 10:06:04 [name], [name]  ([id number]) -------------------------------------------------------------------------------- Patient/Caregiver Education Details Patient Name: [name] Date of Service: 08/24/2016 10:00 AM Medical Record Number: [id number] Patient Account Number: [id number] Date of Birth/Gender: 1966-06-25 (50 y.o. Male) Treating RN: [name], RN, BSN, [place] Primary Care Physician: SYSTEM, PCP Other Clinician: Referring Physician: Lisa Roca Treating Physician/Extender: Frann Rider in Treatment: 9 Education Assessment Education Provided To: Patient Education Topics Provided Venous: Methods: Explain/Verbal Responses: State content correctly Welcome To [facility] [place]: Methods: Explain/Verbal Responses: State content correctly Wound/Skin Impairment: Methods: Explain/Verbal Responses: State content correctly Electronic Signature(s) Signed: 08/24/2016 2:52:21 PM By: Regan Lemming BSN, RN Entered By: Regan Lemming on 08/24/2016 10:17:39 [name], [name] ([id number]) -------------------------------------------------------------------------------- Wound Assessment Details Patient Name: [name] Date of Service: 08/24/2016 10:00 AM Medical Record Number: [id number] Patient Account Number: [id number] Date of Birth/Sex: Aug 05, 1966 (50 y.o. Male) Treating RN: [name], RN, BSN, [place] Primary Care Physician: SYSTEM, PCP Other Clinician: Referring Physician: Lisa Roca Treating Physician/Extender: Frann Rider in Treatment: 9 Wound Status Wound Number: 1 Primary Diabetic Wound/Ulcer of the Lower Etiology: Extremity Wound Location: Right Lower Leg - Lateral Wound Open Wounding Event: Gradually Appeared Status: Date Acquired: 04/23/2016 Comorbid Anemia, Sleep Apnea, Arrhythmia, Weeks Of Treatment: 9 History: Congestive Heart Failure, Hypertension, Clustered Wound: No Peripheral Venous Disease, Type II Diabetes, Gout, Neuropathy Photos Photo Uploaded By: Regan Lemming on 08/24/2016 10:34:57 Wound Measurements Length: (cm) 1 Width: (cm) 1 Depth: (cm) 0.1 Area: (cm) 0.785 Volume: (cm) 0.079 % Reduction in Area: 55.6% % Reduction in Volume: 91.1% Epithelialization: Small (1-33%) Tunneling: No Undermining: No Wound Description Classification: Grade 1 Wound Margin: Thickened Exudate Amount: Small Wound Bed Granulation Amount: Large (67-100%) Exposed Structure Granulation Quality: Pink, Pale, Hyper-granulation Fascia Exposed: No Necrotic Amount: Small (1-33%) Fat Layer Exposed: No Necrotic Quality: Adherent Slough Tendon Exposed: No Muscle Exposed: No Wilcock, [name] ([id number]) Joint Exposed: No Bone Exposed: No Limited to Skin Breakdown Periwound Skin Texture Texture Color No Abnormalities Noted: No No Abnormalities Noted: No Callus: No Atrophie Blanche: No Crepitus: No Cyanosis: No Excoriation: No Ecchymosis: No Fluctuance: No Erythema: No Friable: No Hemosiderin Staining: Yes Induration: No Mottled: No Localized Edema: No Pallor: No Rash: Yes Rubor: No Scarring: Yes Temperature / Pain Moisture Temperature: No Abnormality No Abnormalities Noted: No Tenderness on Palpation: Yes Dry / Scaly: No Maceration: No Moist: Yes Wound Preparation Ulcer Cleansing: Rinsed/Irrigated with Saline Topical Anesthetic Applied: Other: lidocaine 4%, Treatment Notes Wound #1 (Right, Lateral Lower Leg) 1. Cleansed with: Cleanse wound with antibacterial soap and water 4. Dressing Applied: Aquacel Ag 5. Secondary [place] Signature(s) Signed: 08/24/2016 2:52:21 PM By: Regan Lemming BSN, RN Entered By: Regan Lemming on 08/24/2016 10:11:03 AUGUSTUS, ZURAWSKI ([id number]) -------------------------------------------------------------------------------- Vitals Details Patient Name: [name] Date of Service: 08/24/2016 10:00 AM Medical Record Number: [id number] Patient Account Number:  [id number] Date of Birth/Sex: 28-Sep-1966 (50 y.o. Male) Treating RN: [name], RN, BSN, [place] Primary Care Physician: SYSTEM, PCP Other Clinician: Referring Physician: Lisa Roca Treating Physician/Extender: Frann Rider in Treatment: 9 Vital Signs Time Taken: 10:06 Temperature (F): 98.3 Height (in): 64 Pulse (bpm): 86 Weight (lbs): 274 Respiratory Rate (breaths/min): 18 Body Mass Index (BMI): 47 Blood Pressure (mmHg): 143/83 Reference Range:  80 - 120 mg / dl Electronic Signature(s) Signed: 08/24/2016 2:52:21 PM By: Regan Lemming BSN, RN Entered By: Regan Lemming on 08/24/2016 10:08:12

## 2016-08-25 NOTE — Progress Notes (Signed)
[name], Brown ([id number]) Visit Report for 08/24/2016 Chief Complaint Document Details [name], Brown Date of Service: 08/24/2016 10:00 AM Patient Name: M. Patient Account Number: [id number] Medical Record [name], [id number] Treating RN: Number: [name] Date of Birth/Sex: 1966-03-17 (50 y.o. Male) Other Clinician: Primary Care Physician: [name] Treating [name] Referring Physician: Lisa Brown Physician/Extender: [name] in Treatment: 9 Information Obtained from: Patient Chief Complaint Patients presents for treatment of an open diabetic ulcer to the right lower extremity near his car for about 3 months Electronic Signature(s) Signed: 08/24/2016 10:21:21 AM By: [name] Fudge MD, FACS Entered By: [name] on 08/24/2016 10:21:21 [name], [name] ([id number]) -------------------------------------------------------------------------------- HPI Details [name], [name] Date of Service: 08/24/2016 10:00 AM Patient Name: [name] Mages. Patient Account Number: [id number] Medical Record [name], [id number] Treating RN: Number: [name] Date of Birth/Sex: 06/02/1966 (50 y.o. Male) Other Clinician: Primary Care Physician: [name] Treating [name] Referring Physician: Lisa Brown Physician/Extender: [name] in Treatment: 9 History of Present Illness Location: right lower extremity on the lateral calf area Quality: Patient reports experiencing a sharp pain to affected area(s). Severity: Presents with a pain of ____10_on a pain scale of 1-10. Duration: Patient has had the wound for > 3 months prior to seeking treatment at the wound center Timing: Pain in wound is constant (hurts all the time) Context: The wound occurred when the patient may have scratched vigorously in his sleep because of his psoriasis Modifying Factors: Other treatment(s) tried include:takes treatment for psoriasis with local creams and UVB light Associated Signs and Symptoms: Patient  reports presence of swelling HPI Description: 50 year old gentleman was seen recently in the ER last week with a chief complaints of cellulitis and skin ulcer. He has a history of CHF, Psoriasis, hypertension, diabetes mellitus and peripheral neuropathy. 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 The ulcer was on his right lateral calf and has been there for several months and become larger with time. last hemoglobin A1c was 8.3% in May. He has never been a smoker. he had an ultrasound of the right lower extremity for a DVT study and this was normal. Chest x-ray showed no active disease. He was put on clindamycin 300 mg 3 times a day for 10 days. 08/09/2016 -- not been here for 3 weeks and has been doing well and doing his dressings regularly Electronic Signature(s) Signed: 08/24/2016 10:21:28 AM By: [name] Fudge MD, FACS Entered By: [name] on 08/24/2016 10:21:28 [name], [name] ([id number]) -------------------------------------------------------------------------------- Physical Exam Details [name], [name] Date of Service: 08/24/2016 10:00 AM Patient Name: [name] Mages. Patient Account Number: [id number] Medical Record [name], [id number] Treating RN: Number: [name] Date of Birth/Sex: [birth year] (50 y.o. Male) Other Clinician: Primary Care Physician: [name] Treating [name] Referring Physician: Lisa Brown Physician/Extender: Weeks in Treatment: 9 Constitutional . Pulse regular. Respirations normal and unlabored. Afebrile. . Eyes Nonicteric. Reactive to light. Ears, Nose, Mouth, and Throat Lips, teeth, and gums WNL.[name] Kitchen Moist mucosa without lesions. Neck supple and nontender. No palpable supraclavicular or cervical adenopathy. Normal sized without goiter. Respiratory WNL. No retractions.. Breath sounds WNL, No rubs, rales,  rhonchi, or wheeze.. Cardiovascular Heart rhythm and rate regular, no murmur or gallop.. Pedal Pulses WNL. No clubbing, cyanosis or edema. Lymphatic No adneopathy. No adenopathy. No adenopathy. Musculoskeletal Adexa without tenderness or enlargement.. Digits and nails w/o clubbing, cyanosis, infection, petechiae, ischemia, or inflammatory conditions.. Integumentary (Hair, Skin) No  suspicious lesions. No crepitus or fluctuance. No peri-wound warmth or erythema. No masses.[name] Kitchen Psychiatric Judgement and insight Intact.. No evidence of depression, anxiety, or agitation.. Notes the wound continues to improve and with moist saline gauze and a Q-tip I have been able to debride most of the loose tissue at the base of the ulcer. It is filling in nicely. Electronic Signature(s) Signed: 08/24/2016 10:22:04 AM By: [name] Fudge MD, FACS Entered By: [name] on 08/24/2016 10:22:04 [name] ([id number]) -------------------------------------------------------------------------------- Physician Orders Details [name], [name] Date of Service: 08/24/2016 10:00 AM Patient Name: M. Patient Account Number: [id number] Medical Record [name], [id number] Treating RN: Number: [name] Date of Birth/Sex: [birth year] (50 y.o. Male) Other Clinician: Primary Care Physician: [name] Treating [name] Referring Physician: Lisa Brown Physician/Extender: [name] in Treatment: 9 Verbal / Phone Orders: Yes Clinician: [name], RN, Brown, [name] Read Back and Verified: Yes Diagnosis Coding Wound Cleansing Wound #1 Right,Lateral Lower Leg o Cleanse wound with mild soap and water Anesthetic Wound #1 Right,Lateral Lower Leg o Topical Lidocaine 4% cream applied to wound bed prior to debridement Primary Wound Dressing Wound #1 Right,Lateral Lower Leg o Aquacel Ag Secondary Dressing Wound #1 Right,Lateral Lower Leg o Boardered Foam Dressing - telfa island Dressing Change Frequency Wound  #1 Right,Lateral Lower Leg o Change dressing every other day. Follow-up Appointments Wound #1 Right,Lateral Lower Leg o Return Appointment in 1 week. Additional Orders / Instructions Wound #1 Right,Lateral Lower Leg o Increase protein intake. o Activity as tolerated Electronic Signature(s) Signed: 08/24/2016 1:43:20 PM By: [name] Fudge MD, FACS Signed: 08/24/2016 2:52:21 PM By: Regan Lemming BSN, RN [name], [name] ([id number]) Entered By: Regan Lemming on 08/24/2016 10:16:55 Kimmons, Mylen M. ([id number]) -------------------------------------------------------------------------------- Problem List Details Sevey, Davidlee Date of Service: 08/24/2016 10:00 AM Patient Name: M. Patient Account Number: [id number] Medical Record [name], [id number] Treating RN: Number: [name] Date of Birth/Sex: 03/26/1966 (50 y.o. 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 ([id number]) -------------------------------------------------------------------------------- Progress Note Details [name], [name] Date of Service: 08/24/2016 10:00 AM Patient Name: [name] Mages. Patient Account Number: [id number] Medical Record [name], [id number] Treating RN: Number: [name] Date of Birth/Sex: 05-17-1966 (50 y.o. Male) Other Clinician: Primary Care Physician: [name]  Treating [name] Referring Physician: Lisa Brown Physician/Extender: [name] in Treatment: 9 Subjective Chief Complaint Information obtained from Patient Patients presents for treatment of an open diabetic ulcer to the right lower extremity near his car for about 3 months History of Present Illness (HPI) The following HPI elements were documented for the patient's wound: Location: right lower extremity on the lateral calf area Quality: Patient reports experiencing a sharp pain to affected area(s). Severity: Presents with a pain of ____10_on a pain scale of 1-10. Duration: Patient has had the wound for > 3 months prior to seeking treatment at the wound center Timing: Pain in wound is constant (hurts all the time) Context: The wound occurred when the patient may have scratched vigorously in his sleep because of his psoriasis Modifying Factors:  Other treatment(s) tried include:takes treatment for psoriasis with local creams and UVB light Associated Signs and Symptoms: Patient reports presence of swelling 50 year old gentleman was seen recently in the ER last week with a chief complaints of cellulitis and skin ulcer. He has a history of CHF, Psoriasis, hypertension, diabetes mellitus and peripheral neuropathy. 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 The ulcer was on his right lateral calf and has been there for several months and become larger with time. last hemoglobin A1c was 8.3% in May. He has never been a smoker. he had an ultrasound of the right lower extremity for a DVT study and this was normal. Chest x-ray showed no active disease. He was put on clindamycin 300 mg 3 times a day for 10 days. 08/09/2016 -- not been here for 3 weeks and has been doing well and doing his dressings regularly Objective [name], [name]  ([id number]) Constitutional Pulse regular. Respirations normal and unlabored. Afebrile. Vitals Time Taken: 10:06 AM, Height: 64 in, Weight: 274 lbs, BMI: 47, Temperature: 98.3 F, Pulse: 86 bpm, Respiratory Rate: 18 breaths/min, Blood Pressure: 143/83 mmHg. Eyes Nonicteric. Reactive to light. Ears, Nose, Mouth, and Throat Lips, teeth, and gums WNL.[name] Kitchen Moist mucosa without lesions. Neck supple and nontender. No palpable supraclavicular or cervical adenopathy. Normal sized without goiter. Respiratory WNL. No retractions.. Breath sounds WNL, No rubs, rales, rhonchi, or wheeze.. Cardiovascular Heart rhythm and rate regular, no murmur or gallop.. Pedal Pulses WNL. No clubbing, cyanosis or edema. Lymphatic No adneopathy. No adenopathy. No adenopathy. Musculoskeletal Adexa without tenderness or enlargement.. Digits and nails w/o clubbing, cyanosis, infection, petechiae, ischemia, or inflammatory conditions.[name] Kitchen Psychiatric Judgement and insight Intact.. No evidence of depression, anxiety, or agitation.. General Notes: the wound continues to improve and with moist saline gauze and a Q-tip I have been able to debride most of the loose tissue at the base of the ulcer. It is filling in nicely. Integumentary (Hair, Skin) No suspicious lesions. No crepitus or fluctuance. No peri-wound warmth or erythema. No masses.. Wound #1 status is Open. Original cause of wound was Gradually Appeared. The wound is located on the Right,Lateral Lower Leg. The wound measures 1cm length x 1cm width x 0.1cm depth; 0.785cm^2 area and 0.079cm^3 volume. The wound is limited to skin breakdown. There is no tunneling or undermining noted. There is a small amount of drainage noted. The wound margin is thickened. There is large (67- 100%) pink, pale granulation within the wound bed. There is a small (1-33%) amount of necrotic tissue within the wound bed including Adherent Slough. The periwound skin appearance exhibited:  Rash, Scarring, Moist, Hemosiderin Staining. The periwound skin appearance did not exhibit: Callus, Crepitus, Excoriation, Fluctuance, Friable, Induration, Localized Edema, Dry/Scaly, Maceration, Atrophie Blanche, Cyanosis, Ecchymosis, Mottled, Pallor, Rubor, Erythema. Periwound temperature was noted as No Abnormality. The periwound has tenderness on palpation. 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 Follow-up Appointments: Wound #1 Right,Lateral Lower Leg: Return Appointment in 1 week. Additional Orders / Instructions: Wound #1 Right,Lateral Lower Leg: Increase protein intake. Activity as tolerated I have asked him to continue with Silver alginate to be done on alternate days and continue with elevation and exercise. [name], [name] ([id number]) he is urged to keep his diabetes and hypertension under control Electronic Signature(s) Signed: 08/24/2016 10:22:35 AM By: [name] Fudge MD, FACS Entered By: [name] on 08/24/2016 10:22:35 [name], [name] ([id number]) -------------------------------------------------------------------------------- SuperBill Details [name], [name] Date of Service: 08/24/2016 Patient Name: [name] Mages. Patient Account Number: [id number] Medical  Record [name], [id number] Treating RN: Number: [name] Date of Birth/Sex: [birth year] (50 y.o. 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 L40.9 Psoriasis, unspecified E66.01 Morbid (severe) obesity due to excess calories Facility Procedures CPT4 Code: [id number] Description: [phone] - WOUND CARE VISIT-LEV 2 EST PT Modifier: Quantity: 1 Physician Procedures CPT4 Code: [id number] Description: 30092 - WC PHYS LEVEL 3 - EST PT ICD-10 Description Diagnosis E11.622 Type 2 diabetes mellitus with other skin ulcer L97.212 Non-pressure chronic ulcer of right calf with fat L40.9 Psoriasis, unspecified E66.01 Morbid (severe) obesity due to  excess calories Modifier: layer exposed Quantity: 1 Electronic Signature(s) Signed: 08/24/2016 10:22:53 AM By: [name] Fudge MD, FACS Entered By: [name] on 08/24/2016 10:22:53

## 2016-08-31 ENCOUNTER — Encounter: Payer: BLUE CROSS/BLUE SHIELD | Admitting: Surgery

## 2016-08-31 DIAGNOSIS — E11622 Type 2 diabetes mellitus with other skin ulcer: Secondary | ICD-10-CM | POA: Diagnosis not present

## 2016-09-01 NOTE — Progress Notes (Signed)
MERRIC, YOST ([id number]) Visit Report for 08/31/2016 Arrival Information Details Patient Name: [name], [name] Date of Service: 08/31/2016 10:45 AM Medical Record Number: [id number] Patient Account Number: [id number] Date of Birth/Sex: 05-17-1966 (50 y.o. Male) Treating RN: [name], RN, BSN, Velva Harman Primary Care Physician: SYSTEM, PCP Other Clinician: Referring Physician: Lisa Roca Treating Physician/Extender: Frann Rider in Treatment: 10 Visit Information History Since Last Visit All ordered tests and consults were completed: No Patient Arrived: Ambulatory Added or deleted any medications: No Arrival Time: 10:49 Any new allergies or adverse reactions: No Accompanied By: self Had a fall or experienced change in No Transfer Assistance: None activities of daily living that may affect Patient Identification Verified: Yes risk of falls: Secondary Verification Process Yes Signs or symptoms of abuse/neglect since last No Completed: visito Patient Requires Transmission- No Hospitalized since last visit: No Based Precautions: Has Dressing in Place as Prescribed: Yes Patient Has Alerts: Yes Pain Present Now: No Patient Alerts: Patient on Blood Thinner Electronic Signature(s) Signed: 08/31/2016 4:12:23 PM By: Regan Lemming BSN, RN Entered By: Regan Lemming on 08/31/2016 10:50:11 Beamon, [name] ([id number]) -------------------------------------------------------------------------------- Clinic Level of Care Assessment Details Patient Name: [name] Date of Service: 08/31/2016 10:45 AM Medical Record Number: [id number] Patient Account Number: [id number] Date of Birth/Sex: March 07, 1966 (50 y.o. Male) Treating RN: Montey Hora Primary Care Physician: SYSTEM, PCP Other Clinician: Referring Physician: Lisa Roca Treating Physician/Extender: Frann Rider in Treatment: 10 Clinic Level of Care Assessment Items TOOL 4 Quantity Score []  - Use when  only an EandM is performed on FOLLOW-UP visit 0 ASSESSMENTS - Nursing Assessment / Reassessment X - Reassessment of Co-morbidities (includes updates in patient status) 1 10 X - Reassessment of Adherence to Treatment Plan 1 5 ASSESSMENTS - Wound and Skin Assessment / Reassessment X - Simple Wound Assessment / Reassessment - one wound 1 5 []  - Complex Wound Assessment / Reassessment - multiple wounds 0 []  - Dermatologic / Skin Assessment (not related to wound area) 0 ASSESSMENTS - Focused Assessment []  - Circumferential Edema Measurements - multi extremities 0 []  - Nutritional Assessment / Counseling / Intervention 0 X - Lower Extremity Assessment (monofilament, tuning fork, pulses) 1 5 []  - Peripheral Arterial Disease Assessment (using hand held doppler) 0 ASSESSMENTS - Ostomy and/or Continence Assessment and Care []  - Incontinence Assessment and Management 0 []  - Ostomy Care Assessment and Management (repouching, etc.) 0 PROCESS - Coordination of Care X - Simple Patient / Family Education for ongoing care 1 15 []  - Complex (extensive) Patient / Family Education for ongoing care 0 []  - Staff obtains Programmer, systems, Records, Test Results / Process Orders 0 []  - Staff telephones HHA, Nursing Homes / Clarify orders / etc 0 []  - Routine Transfer to another Facility (non-emergent condition) 0 Wilmore, [name] ([id number]) []  - Routine Hospital Admission (non-emergent condition) 0 []  - New Admissions / Biomedical engineer / Ordering NPWT, Apligraf, etc. 0 []  - Emergency Hospital Admission (emergent condition) 0 X - Simple Discharge Coordination 1 10 []  - Complex (extensive) Discharge Coordination 0 PROCESS - Special Needs []  - Pediatric / Minor Patient Management 0 []  - Isolation Patient Management 0 []  - Hearing / Language / Visual special needs 0 []  - Assessment of Community assistance (transportation, D/C planning, etc.) 0 []  - Additional assistance / Altered mentation 0 []  - Support  Surface(s) Assessment (bed, cushion, seat, etc.) 0 INTERVENTIONS - Wound Cleansing / Measurement X - Simple Wound Cleansing - one wound 1 5 []  - Complex Wound  Cleansing - multiple wounds 0 X - Wound Imaging (photographs - any number of wounds) 1 5 []  - Wound Tracing (instead of photographs) 0 X - Simple Wound Measurement - one wound 1 5 []  - Complex Wound Measurement - multiple wounds 0 INTERVENTIONS - Wound Dressings X - Small Wound Dressing one or multiple wounds 1 10 []  - Medium Wound Dressing one or multiple wounds 0 []  - Large Wound Dressing one or multiple wounds 0 []  - Application of Medications - topical 0 []  - Application of Medications - injection 0 INTERVENTIONS - Miscellaneous []  - External ear exam 0 [name], [name] ([id number]) []  - Specimen Collection (cultures, biopsies, blood, body fluids, etc.) 0 []  - Specimen(s) / Culture(s) sent or taken to Lab for analysis 0 []  - Patient Transfer (multiple staff / Harrel Lemon Lift / Similar devices) 0 []  - Simple Staple / Suture removal (25 or less) 0 []  - Complex Staple / Suture removal (26 or more) 0 []  - Hypo / Hyperglycemic Management (close monitor of Blood Glucose) 0 []  - Ankle / Brachial Index (ABI) - do not check if billed separately 0 X - Vital Signs 1 5 Has the patient been seen at the hospital within the last three years: Yes Total Score: 80 Level Of Care: New/Established - Level 3 Electronic Signature(s) Signed: 08/31/2016 5:02:11 PM By: Montey Hora Entered By: Montey Hora on 08/31/2016 11:08:19 Perz, [name] ([id number]) -------------------------------------------------------------------------------- Encounter Discharge Information Details Patient Name: [name] Date of Service: 08/31/2016 10:45 AM Medical Record Number: [id number] Patient Account Number: [id number] Date of Birth/Sex: 08/02/1966 (50 y.o. Male) Treating RN: [name], RN, BSN, [place] Primary Care Physician: SYSTEM, PCP Other  Clinician: Referring Physician: Lisa Roca Treating Physician/Extender: Frann Rider in Treatment: 10 Encounter Discharge Information Items Discharge Pain Level: 0 Discharge Condition: Stable Ambulatory Status: Ambulatory Discharge Destination: Home Transportation: Private Auto Accompanied By: self Schedule Follow-up Appointment: No Medication Reconciliation completed and provided to Patient/Care No Provider: Provided on Clinical Summary of Care: 08/31/2016 Form Type Recipient Paper Patient JT Electronic Signature(s) Signed: 08/31/2016 11:17:27 AM By: Regan Lemming BSN, RN Previous Signature: 08/31/2016 11:14:29 AM Version By: Ruthine Dose Entered By: Regan Lemming on 08/31/2016 11:17:27 Alen, [name] ([id number]) -------------------------------------------------------------------------------- Lower Extremity Assessment Details Patient Name: [name] Date of Service: 08/31/2016 10:45 AM Medical Record Number: [id number] Patient Account Number: [id number] Date of Birth/Sex: 1965/12/28 (50 y.o. Male) Treating RN: [name], RN, BSN, [place] Primary Care Physician: SYSTEM, PCP Other Clinician: Referring Physician: Lisa Roca Treating Physician/Extender: Frann Rider in Treatment: 10 Vascular Assessment Pulses: Posterior Tibial Dorsalis Pedis Palpable: [Right:Yes] Extremity colors, hair growth, and conditions: Extremity Color: [Right:Hyperpigmented] Hair Growth on Extremity: [Right:No] Temperature of Extremity: [Right:Warm] Capillary Refill: [Right:< 3 seconds] Electronic Signature(s) Signed: 08/31/2016 4:12:23 PM By: Regan Lemming BSN, RN Entered By: Regan Lemming on 08/31/2016 10:53:03 Monceaux, [name] ([id number]) -------------------------------------------------------------------------------- Multi Wound Chart Details Patient Name: [name] Date of Service: 08/31/2016 10:45 AM Medical Record Number: [id number] Patient Account  Number: [id number] Date of Birth/Sex: [birth year] (50 y.o. Male) Treating RN: Montey Hora Primary Care Physician: SYSTEM, PCP Other Clinician: Referring Physician: Lisa Roca Treating Physician/Extender: Frann Rider in Treatment: 10 Vital Signs Height(in): 64 Pulse(bpm): 85 Weight(lbs): 274 Blood Pressure 140/80 (mmHg): Body Mass Index(BMI): 47 Temperature(F): 98.5 Respiratory Rate 18 (breaths/min): Photos: [1:No Photos] [N/A:N/A] Wound Location: [1:Right Lower Leg - Lateral] [N/A:N/A] Wounding Event: [1:Gradually Appeared] [N/A:N/A] Primary Etiology: [1:Diabetic Wound/Ulcer of the Lower Extremity] [N/A:N/A] Comorbid History: [1:Anemia, Sleep Apnea, Arrhythmia,  Congestive Heart Failure, Hypertension, Peripheral Venous Disease, Type II Diabetes, Gout, Neuropathy] [N/A:N/A] Date Acquired: [1:04/23/2016] [N/A:N/A] Weeks of Treatment: [1:10] [N/A:N/A] Wound Status: [1:Open] [N/A:N/A] Measurements L x W x D 1.3x0.8x0.1 [N/A:N/A] (cm) Area (cm) : [1:0.817] [N/A:N/A] Volume (cm) : [1:0.082] [N/A:N/A] % Reduction in Area: [1:53.80%] [N/A:N/A] % Reduction in Volume: 90.70% [N/A:N/A] Classification: [1:Grade 1] [N/A:N/A] Exudate Amount: [1:Medium] [N/A:N/A] Wound Margin: [1:Thickened] [N/A:N/A] Granulation Amount: [1:Large (67-100%)] [N/A:N/A] Granulation Quality: [1:Pink, Pale, Hyper- granulation] [N/A:N/A] Necrotic Amount: [1:Small (1-33%)] [N/A:N/A] Exposed Structures: [1:Fascia: No Fat: No] [N/A:N/A] Tendon: No Muscle: No Joint: No Bone: No Limited to Skin Breakdown Epithelialization: Small (1-33%) N/A N/A Periwound Skin Texture: Rash: Yes N/A N/A Scarring: Yes Edema: No Excoriation: No Induration: No Callus: No Crepitus: No Fluctuance: No Friable: No Periwound Skin Moist: Yes N/A N/A Moisture: Maceration: No Dry/Scaly: No Periwound Skin Color: Hemosiderin Staining: Yes N/A N/A Atrophie Blanche: No Cyanosis: No Ecchymosis: No Erythema:  No Mottled: No Pallor: No Rubor: No Temperature: No Abnormality N/A N/A Tenderness on Yes N/A N/A Palpation: Wound Preparation: Ulcer Cleansing: N/A N/A Rinsed/Irrigated with Saline Topical Anesthetic Applied: Other: lidocaine 4% Treatment Notes Electronic Signature(s) Signed: 08/31/2016 5:02:11 PM By: Montey Hora Entered By: Montey Hora on 08/31/2016 11:06:41 Byard, [name] ([id number]) -------------------------------------------------------------------------------- Multi-Disciplinary Care Plan Details Patient Name: [name] Date of Service: 08/31/2016 10:45 AM Medical Record Number: [id number] Patient Account Number: [id number] Date of Birth/Sex: 1966/03/09 (50 y.o. Male) Treating RN: Montey Hora Primary Care Physician: SYSTEM, PCP Other Clinician: Referring Physician: Lisa Roca Treating Physician/Extender: Frann Rider in Treatment: 10 Active Inactive Orientation to the Wound Care Program Nursing Diagnoses: Knowledge deficit related to the wound healing center program Goals: Patient/caregiver will verbalize understanding of the [facility] Program Date Initiated: 06/18/2016 Goal Status: Active Interventions: Provide education on orientation to the wound center Notes: Venous Leg Ulcer Nursing Diagnoses: Knowledge deficit related to disease process and management Potential for venous Insuffiency (use before diagnosis confirmed) Goals: Patient will maintain optimal edema control Date Initiated: 06/18/2016 Goal Status: Active Patient/caregiver will verbalize understanding of disease process and disease management Date Initiated: 06/18/2016 Goal Status: Active Verify adequate tissue perfusion prior to therapeutic compression application Date Initiated: 06/18/2016 Goal Status: Active Interventions: Compression as ordered Provide education on venous insufficiency Notes: CORKY, BLUMSTEIN ([id number]) Wound/Skin  Impairment Nursing Diagnoses: Impaired tissue integrity Goals: Patient/caregiver will verbalize understanding of skin care regimen Date Initiated: 06/18/2016 Goal Status: Active Ulcer/skin breakdown will have a volume reduction of 30% by week 4 Date Initiated: 06/18/2016 Goal Status: Active Ulcer/skin breakdown will have a volume reduction of 50% by week 8 Date Initiated: 06/18/2016 Goal Status: Active Ulcer/skin breakdown will have a volume reduction of 80% by week 12 Date Initiated: 06/18/2016 Goal Status: Active Ulcer/skin breakdown will heal within 14 weeks Date Initiated: 06/18/2016 Goal Status: Active Interventions: Assess patient/caregiver ability to obtain necessary supplies Assess patient/caregiver ability to perform ulcer/skin care regimen upon admission and as needed Assess ulceration(s) every visit Provide education on ulcer and skin care Treatment Activities: Skin care regimen initiated : 06/18/2016 Topical wound management initiated : 06/18/2016 Notes: Electronic Signature(s) Signed: 08/31/2016 5:02:11 PM By: Montey Hora Entered By: Montey Hora on 08/31/2016 11:06:18 Provencio, [name] ([id number]) -------------------------------------------------------------------------------- Pain Assessment Details Patient Name: [name] Date of Service: 08/31/2016 10:45 AM Medical Record Number: [id number] Patient Account Number: [id number] Date of Birth/Sex: June 23, 1966 (50 y.o. Male) Treating RN: [name], RN, BSN, [place] Primary Care Physician: SYSTEM, PCP Other Clinician: Referring Physician: Lisa Roca Treating  Physician/Extender: Frann Rider in Treatment: 10 Active Problems Location of Pain Severity and Description of Pain Patient Has Paino No Site Locations With Dressing Change: No Pain Management and Medication Current Pain Management: Electronic Signature(s) Signed: 08/31/2016 4:12:23 PM By: Regan Lemming BSN, RN Entered By: Regan Lemming on 08/31/2016  10:50:54 Voong, [name] ([id number]) -------------------------------------------------------------------------------- Patient/Caregiver Education Details Patient Name: [name] Date of Service: 08/31/2016 10:45 AM Medical Record Number: [id number] Patient Account Number: [id number] Date of Birth/Gender: [birth year] (50 y.o. Male) Treating RN: [name], RN, BSN, [place] Primary Care Physician: SYSTEM, PCP Other Clinician: Referring Physician: Lisa Roca Treating Physician/Extender: Frann Rider in Treatment: 10 Education Assessment Education Provided To: Patient Education Topics Provided Venous: Methods: Explain/Verbal Responses: State content correctly Welcome To [facility] [place]: Methods: Explain/Verbal Responses: State content correctly Wound/Skin Impairment: Methods: Explain/Verbal Responses: State content correctly Electronic Signature(s) Signed: 08/31/2016 4:12:23 PM By: Regan Lemming BSN, RN Entered By: Regan Lemming on 08/31/2016 11:17:46 Nevills, [name] ([id number]) -------------------------------------------------------------------------------- Wound Assessment Details Patient Name: [name] Date of Service: 08/31/2016 10:45 AM Medical Record Number: [id number] Patient Account Number: [id number] Date of Birth/Sex: 1966-02-13 (50 y.o. Male) Treating RN: [name], RN, BSN, [place] Primary Care Physician: SYSTEM, PCP Other Clinician: Referring Physician: Lisa Roca Treating Physician/Extender: Frann Rider in Treatment: 10 Wound Status Wound Number: 1 Primary Diabetic Wound/Ulcer of the Lower Etiology: Extremity Wound Location: Right Lower Leg - Lateral Wound Open Wounding Event: Gradually Appeared Status: Date Acquired: 04/23/2016 Comorbid Anemia, Sleep Apnea, Arrhythmia, Weeks Of Treatment: 10 History: Congestive Heart Failure, Hypertension, Clustered Wound: No Peripheral Venous Disease, Type II Diabetes, Gout,  Neuropathy Photos Photo Uploaded By: Regan Lemming on 08/31/2016 15:41:27 Wound Measurements Length: (cm) 1.3 Width: (cm) 0.8 Depth: (cm) 0.1 Area: (cm) 0.817 Volume: (cm) 0.082 % Reduction in Area: 53.8% % Reduction in Volume: 90.7% Epithelialization: Small (1-33%) Tunneling: No Wound Description Classification: Grade 1 Wound Margin: Thickened Exudate Amount: Medium Wound Bed Granulation Amount: Large (67-100%) Exposed Structure Granulation Quality: Pink, Pale, Hyper-granulation Fascia Exposed: No Necrotic Amount: Small (1-33%) Fat Layer Exposed: No Necrotic Quality: Adherent Slough Tendon Exposed: No Muscle Exposed: No Streeter, [name] ([id number]) Joint Exposed: No Bone Exposed: No Limited to Skin Breakdown Periwound Skin Texture Texture Color No Abnormalities Noted: No No Abnormalities Noted: No Callus: No Atrophie Blanche: No Crepitus: No Cyanosis: No Excoriation: No Ecchymosis: No Fluctuance: No Erythema: No Friable: No Hemosiderin Staining: Yes Induration: No Mottled: No Localized Edema: No Pallor: No Rash: Yes Rubor: No Scarring: Yes Temperature / Pain Moisture Temperature: No Abnormality No Abnormalities Noted: No Tenderness on Palpation: Yes Dry / Scaly: No Maceration: No Moist: Yes Wound Preparation Ulcer Cleansing: Rinsed/Irrigated with Saline Topical Anesthetic Applied: Other: lidocaine 4%, Treatment Notes Wound #1 (Right, Lateral Lower Leg) 1. Cleansed with: Clean wound with Normal Saline 4. Dressing Applied: Aquacel Ag 5. Secondary Dressing Applied Gauze and Kerlix/Conform 7. Secured with Recruitment consultant) Signed: 08/31/2016 4:12:23 PM By: Regan Lemming BSN, RN Entered By: Regan Lemming on 08/31/2016 10:52:46 Weyrauch, [name] ([id number]) -------------------------------------------------------------------------------- Vitals Details Patient Name: [name] Date of Service: 08/31/2016 10:45 AM Medical  Record Number: [id number] Patient Account Number: [id number] Date of Birth/Sex: July 26, 1966 (50 y.o. Male) Treating RN: [name], RN, BSN, Velva Harman Primary Care Physician: SYSTEM, PCP Other Clinician: Referring Physician: Lisa Roca Treating Physician/Extender: Frann Rider in Treatment: 10 Vital Signs Time Taken: 10:54 Temperature (F): 98.5 Height (in): 64 Pulse (bpm): 85 Weight (lbs): 274 Respiratory Rate (breaths/min): 18 Body Mass Index (  BMI): 47 Blood Pressure (mmHg): 140/80 Reference Range: 80 - 120 mg / dl Electronic Signature(s) Signed: 08/31/2016 4:12:23 PM By: Regan Lemming BSN, RN Entered By: Regan Lemming on 08/31/2016 10:55:05

## 2016-09-01 NOTE — Progress Notes (Signed)
[name], [name] ([id number]) Visit Report for 08/31/2016 Chief Complaint Document Details Brown, [name] 08/31/2016 10:45 Patient Name: Date of Service: M. AM Medical Record Patient Account Number: [id number] Number: Treating RN: [name], RN, BSN, Velva Harman Date of Birth/Sex: 1966/08/30 (50 y.o. Male) Other Clinician: Primary Care Physician: SYSTEM, PCP Treating Christin Fudge Referring Physician: Lisa Roca Physician/Extender: Weeks in Treatment: 10 Information Obtained from: Patient Chief Complaint Patients presents for treatment of an open diabetic ulcer to the right lower extremity near his car for about 3 months Electronic Signature(s) Signed: 08/31/2016 11:19:31 AM By: Christin Fudge MD, FACS Entered By: Christin Fudge on 08/31/2016 11:19:31 Shell, Drayk M. ([id number]) -------------------------------------------------------------------------------- HPI Details Brown, [name] 08/31/2016 10:45 Patient Name: Date of Service: M. AM Medical Record Patient Account Number: [id number] Number: Treating RN: [name], RN, BSN, Velva Harman Date of Birth/Sex: [birth year] (50 y.o. Male) Other Clinician: Primary Care Physician: SYSTEM, PCP Treating Britto, Errol Referring Physician: Lisa Roca Physician/Extender: Weeks in Treatment: 10 History of Present Illness Location: right lower extremity on the lateral calf area Quality: Patient reports experiencing a sharp pain to affected area(s). Severity: Presents with a pain of ____10_on a pain scale of 1-10. Duration: Patient has had the wound for > 3 months prior to seeking treatment at the wound center Timing: Pain in wound is constant (hurts all the time) Context: The wound occurred when the patient may have scratched vigorously in his sleep because of his psoriasis Modifying Factors: Other treatment(s) tried include:takes treatment for psoriasis with local creams and UVB light Associated Signs and Symptoms:  Patient reports presence of swelling HPI Description: 50 year old gentleman was seen recently in the ER last week with a chief complaints of cellulitis and skin ulcer. He has a history of CHF, Psoriasis, hypertension, diabetes mellitus and peripheral neuropathy. 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 The ulcer was on his right lateral calf and has been there for several months and become larger with time. last hemoglobin A1c was 8.3% in May. He has never been a smoker. he had an ultrasound of the right lower extremity for a DVT study and this was normal. Chest x-ray showed no active disease. He was put on clindamycin 300 mg 3 times a day for 10 days. 08/09/2016 -- not been here for 3 weeks and has been doing well and doing his dressings regularly Electronic Signature(s) Signed: 08/31/2016 11:19:35 AM By: Christin Fudge MD, FACS Entered By: Christin Fudge on 08/31/2016 11:19:34 Leavell, Reagen M. ([id number]) -------------------------------------------------------------------------------- Physical Exam Details Brown, [name] 08/31/2016 10:45 Patient Name: Date of Service: M. AM Medical Record Patient Account Number: [id number] Number: Treating RN: [name], RN, BSN, Velva Harman Date of Birth/Sex: [birth year] (50 y.o. Male) Other Clinician: Primary Care Physician: SYSTEM, PCP Treating Britto, Errol Referring Physician: Lisa Roca Physician/Extender: Weeks in Treatment: 10 Constitutional . Pulse regular. Respirations normal and unlabored. Afebrile. . Eyes Nonicteric. Reactive to light. Ears, Nose, Mouth, and Throat Lips, teeth, and gums WNL.[name] Kitchen Moist mucosa without lesions. Neck supple and nontender. No palpable supraclavicular or cervical adenopathy. Normal sized without goiter. Respiratory WNL. No retractions.. Cardiovascular Pedal  Pulses WNL. No clubbing, cyanosis or edema. Lymphatic No adneopathy. No adenopathy. No adenopathy. Musculoskeletal Adexa without tenderness or enlargement.. Digits and nails w/o clubbing, cyanosis, infection, petechiae, ischemia, or inflammatory conditions.. Integumentary (Hair, Skin) No suspicious lesions. No crepitus or fluctuance. No peri-wound warmth or erythema. No masses.[name] Kitchen Psychiatric Judgement and insight Intact.[name] Kitchen  No evidence of depression, anxiety, or agitation.. Notes the main wound looks okay but there is a bit of excoriation around and it is possibly a tape burn. The lymphedema is slowly going down. Electronic Signature(s) Signed: 08/31/2016 11:20:30 AM By: Christin Fudge MD, FACS Entered By: Christin Fudge on 08/31/2016 11:20:29 DOYL, BITTING ([id number]) -------------------------------------------------------------------------------- Physician Orders Details Brown, [name] 08/31/2016 10:45 Patient Name: Date of Service: M. AM Medical Record Patient Account Number: [id number] Number: Treating RN: Montey Hora Date of Birth/Sex: 1966/11/01 (50 y.o. Male) Other Clinician: Primary Care Physician: SYSTEM, PCP Treating Britto, Errol Referring Physician: Lisa Roca Physician/Extender: Suella [place] in Treatment: 10 Verbal / Phone Orders: Yes Clinician: Montey Hora Read Back and Verified: Yes Diagnosis Coding Wound Cleansing Wound #1 Right,Lateral Lower Leg o Cleanse wound with mild soap and water Anesthetic Wound #1 Right,Lateral Lower Leg o Topical Lidocaine 4% cream applied to wound bed prior to debridement Primary Wound Dressing Wound #1 Right,Lateral Lower Leg o Aquacel Ag Secondary Dressing Wound #1 Right,Lateral Lower Leg o Gauze and Kerlix/Conform Dressing Change Frequency Wound #1 Right,Lateral Lower Leg o Change dressing every other day. Follow-up Appointments Wound #1 Right,Lateral Lower Leg o Return Appointment in 1  week. Additional Orders / Instructions Wound #1 Right,Lateral Lower Leg o Increase protein intake. o Activity as tolerated Electronic Signature(s) Signed: 08/31/2016 12:32:55 PM By: Christin Fudge MD, FACS Signed: 08/31/2016 5:02:11 PM By: Casper Harrison, Sajjad M. ([id number]) Entered By: Montey Hora on 08/31/2016 11:07:49 Hoxworth, Andrius M. ([id number]) -------------------------------------------------------------------------------- Problem List Details Clairmont, Kelyn 08/31/2016 10:45 Patient Name: Date of Service: M. AM Medical Record Patient Account Number: [id number] Number: Treating RN: [name], RN, BSN, Velva Harman Date of Birth/Sex: [birth year] (50 y.o. 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 ([id number]) -------------------------------------------------------------------------------- Progress Note Details Pierrelouis, Oakwood 08/31/2016 10:45 Patient Name: Date of Service: M. AM Medical Record Patient Account Number: [id number] Number: Treating RN: [name], RN, BSN, Velva Harman Date of Birth/Sex: 04/18/1966 (50 y.o. Male) Other Clinician: Primary Care Physician: SYSTEM, PCP Treating Britto, Errol Referring Physician: Lisa Roca Physician/Extender: Weeks in Treatment: 10 Subjective Chief  Complaint Information obtained from Patient Patients presents for treatment of an open diabetic ulcer to the right lower extremity near his car for about 3 months History of Present Illness (HPI) The following HPI elements were documented for the patient's wound: Location: right lower extremity on the lateral calf area Quality: Patient reports experiencing a sharp pain to affected area(s). Severity: Presents with a pain of ____10_on a pain scale of 1-10. Duration: Patient has had the wound for > 3 months prior to seeking treatment at the wound center Timing: Pain in wound is constant (hurts all the time) Context: The wound occurred when the patient may have scratched vigorously in his sleep because of his psoriasis Modifying Factors: Other treatment(s) tried include:takes treatment for psoriasis with local creams and UVB light Associated Signs and Symptoms: Patient reports presence of swelling 50 year old gentleman was seen recently in the ER last week with a chief complaints  of cellulitis and skin ulcer. He has a history of CHF, Psoriasis, hypertension, diabetes mellitus and peripheral neuropathy. 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 The ulcer was on his right lateral calf and has been there for several months and become larger with time. last hemoglobin A1c was 8.3% in May. He has never been a smoker. he had an ultrasound of the right lower extremity for a DVT study and this was normal. Chest x-ray showed no active disease. He was put on clindamycin 300 mg 3 times a day for 10 days. 08/09/2016 -- not been here for 3 weeks and has been doing well and doing his dressings regularly Objective Alwin, Ardis M. ([id number]) Constitutional Pulse regular. Respirations normal and unlabored. Afebrile. Vitals Time Taken: 10:54 AM, Height: 64 in,  Weight: 274 lbs, BMI: 47, Temperature: 98.5 F, Pulse: 85 bpm, Respiratory Rate: 18 breaths/min, Blood Pressure: 140/80 mmHg. Eyes Nonicteric. Reactive to light. Ears, Nose, Mouth, and Throat Lips, teeth, and gums WNL.[name] Kitchen Moist mucosa without lesions. Neck supple and nontender. No palpable supraclavicular or cervical adenopathy. Normal sized without goiter. Respiratory WNL. No retractions.. Cardiovascular Pedal Pulses WNL. No clubbing, cyanosis or edema. Lymphatic No adneopathy. No adenopathy. No adenopathy. Musculoskeletal Adexa without tenderness or enlargement.. Digits and nails w/o clubbing, cyanosis, infection, petechiae, ischemia, or inflammatory conditions.[name] Kitchen Psychiatric Judgement and insight Intact.. No evidence of depression, anxiety, or agitation.. General Notes: the main wound looks okay but there is a bit of excoriation around and it is possibly a tape burn. The lymphedema is slowly going down. Integumentary (Hair, Skin) No suspicious lesions. No crepitus or fluctuance. No peri-wound warmth or erythema. No masses.. Wound #1 status is Open. Original cause of wound was Gradually Appeared. The wound is located on the Right,Lateral Lower Leg. The wound measures 1.3cm length x 0.8cm width x 0.1cm depth; 0.817cm^2 area and 0.082cm^3 volume. The wound is limited to skin breakdown. There is no tunneling noted. There is a medium amount of drainage noted. The wound margin is thickened. There is large (67-100%) pink, pale granulation within the wound bed. There is a small (1-33%) amount of necrotic tissue within the wound bed including Adherent Slough. The periwound skin appearance exhibited: Rash, Scarring, Moist, Hemosiderin Staining. The periwound skin appearance did not exhibit: Callus, Crepitus, Excoriation, Fluctuance, Friable, Induration, Localized Edema, Dry/Scaly, Maceration, Atrophie Blanche, Cyanosis, Ecchymosis, Mottled, Pallor, Rubor, Erythema. Periwound temperature was  noted as No Abnormality. The periwound has tenderness on palpation. AMAY, MIJANGOS ([id number]) Assessment Active Problems ICD-10 E11.622 - Type 2 diabetes mellitus with other skin ulcer L97.212 - Non-pressure chronic ulcer of right calf with fat layer exposed L40.9 - Psoriasis, unspecified E66.01 - Morbid (severe) obesity due to excess calories Plan Wound Cleansing: Wound #1 Right,Lateral Lower Leg: Cleanse wound with mild soap and water Anesthetic: Wound #1 Right,Lateral Lower Leg: Topical Lidocaine 4% cream applied to wound bed prior to debridement Primary Wound Dressing: Wound #1 Right,Lateral Lower Leg: Aquacel Ag Secondary Dressing: Wound #1 Right,Lateral Lower Leg: Gauze and Kerlix/Conform Dressing Change Frequency: Wound #1 Right,Lateral Lower Leg: Change dressing every other day. Follow-up Appointments: Wound #1 Right,Lateral Lower Leg: Return Appointment in 1 week. Additional Orders / Instructions: Wound #1 Right,Lateral Lower Leg: Increase protein intake. Activity as tolerated I have asked him to continue with Silver alginate to be done on alternate days and continue with elevation and exercise. DAMONTRE, MILLEA. ([id number]) he is  urged to keep his diabetes and hypertension under control. I have also asked him to watch very carefully all his salt intake especially when he is eating out Electronic Signature(s) Signed: 08/31/2016 11:22:22 AM By: Christin Fudge MD, FACS Entered By: Christin Fudge on 08/31/2016 11:22:22 Marteney, Cloud M. ([id number]) -------------------------------------------------------------------------------- SuperBill Details Yandell, Dmarion Date of Service: 08/31/2016 Patient Name: [name] Patient Account Number: [id number] Medical Record [name], RN, BSN, [id number] Treating RN: Number: Velva Harman Date of Birth/Sex: 1966-09-01 (50 y.o. 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 L40.9 Psoriasis, unspecified E66.01 Morbid (severe) obesity due to excess calories Facility Procedures CPT4 Code: [id number] Description: 99213 - WOUND CARE VISIT-LEV 3 EST PT Modifier: Quantity: 1 Physician Procedures CPT4 Code: [id number] Description: 01779 - WC PHYS LEVEL 3 - EST PT ICD-10 Description Diagnosis E11.622 Type 2 diabetes mellitus with other skin ulcer L97.212 Non-pressure chronic ulcer of right calf with fat L40.9 Psoriasis, unspecified E66.01 Morbid (severe) obesity due to  excess calories Modifier: layer exposed Quantity: 1 Electronic Signature(s) Signed: 08/31/2016 11:22:37 AM By: Christin Fudge MD, FACS Entered By: Christin Fudge on 08/31/2016 11:22:37

## 2016-09-07 ENCOUNTER — Encounter: Payer: BLUE CROSS/BLUE SHIELD | Admitting: Surgery

## 2016-09-07 DIAGNOSIS — E11622 Type 2 diabetes mellitus with other skin ulcer: Secondary | ICD-10-CM | POA: Diagnosis not present

## 2016-09-08 NOTE — Progress Notes (Signed)
[name] ([id number]) Visit Report for 09/07/2016 Chief Complaint Document Details [name], [name] Date of Service: 09/07/2016 2:15 PM Patient Name: M. Patient Account Number: [id number] Medical Record [name], [id number] Treating [name]: Number: [name] Date of Birth/Sex: 1966-05-31 (50 y.o. Male) Other Clinician: Primary Care Physician: SYSTEM, PCP Treating Christin Fudge Referring Physician: Lisa Roca Physician/Extender: Suella Grove in Treatment: 11 Information Obtained from: Patient Chief Complaint Patients presents for treatment of an open diabetic ulcer to the right lower extremity near his car for about 3 months Electronic Signature(s) Signed: 09/07/2016 2:27:35 PM By: Christin Fudge MD, FACS Entered By: Christin Fudge on 09/07/2016 14:27:35 [name], [name] ([id number]) -------------------------------------------------------------------------------- HPI Details [name] Date of Service: 09/07/2016 2:15 PM Patient Name: [name] Patient Account Number: [id number] Medical Record [name], [id number] Treating [name]: Number: [name] Date of Birth/Sex: 1966-12-01 (50 y.o. Male) Other Clinician: Primary Care Physician: SYSTEM, PCP Treating Christin Fudge Referring Physician: Lisa Roca Physician/Extender: Weeks in Treatment: 11 History of Present Illness Location: right lower extremity on the lateral calf area Quality: Patient reports experiencing a sharp pain to affected area(s). Severity: Presents with a pain of ____10_on a pain scale of 1-10. Duration: Patient has had the wound for > 3 months prior to seeking treatment at the wound center Timing: Pain in wound is constant (hurts all the time) Context: The wound occurred when the patient may have scratched vigorously in his sleep because of his psoriasis Modifying Factors: Other treatment(s) tried include:takes treatment for psoriasis with local creams and UVB light Associated Signs and Symptoms: Patient  reports presence of swelling HPI Description: 50 year old gentleman was seen recently in the ER last week with a chief complaints of cellulitis and skin ulcer. He has a history of CHF, Psoriasis, hypertension, diabetes mellitus and peripheral neuropathy. 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 The ulcer was on his right lateral calf and has been there for several months and become larger with time. last hemoglobin A1c was 8.3% in May. He has never been a smoker. he had an ultrasound of the right lower extremity for a DVT study and this was normal. Chest x-ray showed no active disease. He was put on clindamycin 300 mg 3 times a day for 10 days. 08/09/2016 -- not been here for 3 weeks and has been doing well and doing his dressings regularly Electronic Signature(s) Signed: 09/07/2016 2:27:41 PM By: Christin Fudge MD, FACS Entered By: Christin Fudge on 09/07/2016 14:27:41 [name], [name] ([id number]) -------------------------------------------------------------------------------- Physical Exam Details [name] Date of Service: 09/07/2016 2:15 PM Patient Name: [name] Patient Account Number: [id number] Medical Record [name], [id number] Treating [name]: Number: [name] Date of Birth/Sex: [birth year] (50 y.o. Male) Other Clinician: Primary Care Physician: SYSTEM, PCP Treating Christin Fudge Referring Physician: Lisa Roca Physician/Extender: Weeks in Treatment: 11 Constitutional . Pulse regular. Respirations normal and unlabored. Afebrile. . Eyes Nonicteric. Reactive to light. Ears, Nose, Mouth, and Throat Lips, teeth, and gums WNL.[name] Kitchen Moist mucosa without lesions. Neck supple and nontender. No palpable supraclavicular or cervical adenopathy. Normal sized without goiter. Respiratory WNL. No retractions.. Cardiovascular Pedal Pulses WNL. No  clubbing, cyanosis or edema. Lymphatic No adneopathy. No adenopathy. No adenopathy. Musculoskeletal Adexa without tenderness or enlargement.. Digits and nails w/o clubbing, cyanosis, infection, petechiae, ischemia, or inflammatory conditions.. Integumentary (Hair, Skin) No suspicious lesions. No crepitus or fluctuance. No peri-wound warmth or erythema. No masses.[name] Kitchen Psychiatric Judgement and insight Intact.[name] Kitchen  No evidence of depression, anxiety, or agitation.. Notes the wound looks very good today and did not need any sharp debridement but I washed it out well with moist saline gauze Electronic Signature(s) Signed: 09/07/2016 2:28:13 PM By: Christin Fudge MD, FACS Entered By: Christin Fudge on 09/07/2016 14:28:13 [name], [name] ([id number]) -------------------------------------------------------------------------------- Physician Orders Details [name] Date of Service: 09/07/2016 2:15 PM Patient Name: [name] Patient Account Number: [id number] Medical Record [name], [id number] Treating [name]: Number: [name] Date of Birth/Sex: 06/09/1966 (50 y.o. Male) Other Clinician: Primary Care Physician: SYSTEM, PCP Treating Britto, Errol Referring Physician: Lisa Roca Physician/Extender: Suella Grove in Treatment: 70 Verbal / Phone Orders: Yes Clinician: [name], [name], [name] Read Back and Verified: Yes Diagnosis Coding Wound Cleansing Wound #1 Right,Lateral Lower Leg o Cleanse wound with mild soap and water Anesthetic Wound #1 Right,Lateral Lower Leg o Topical Lidocaine 4% cream applied to wound bed prior to debridement Primary Wound Dressing Wound #1 Right,Lateral Lower Leg o Prisma Ag Secondary Dressing Wound #1 Right,Lateral Lower Leg o Non-adherent pad - telfa island Dressing Change Frequency Wound #1 Right,Lateral Lower Leg o Change dressing every other day. Follow-up Appointments Wound #1 Right,Lateral Lower Leg o Return Appointment in 1 week. Additional  Orders / Instructions Wound #1 Right,Lateral Lower Leg o Increase protein intake. o Activity as tolerated Electronic Signature(s) Signed: 09/07/2016 3:52:17 PM By: Christin Fudge MD, FACS Signed: 09/07/2016 4:34:10 PM By: Regan Lemming BSN, [name] Strzelecki, Phelan M. ([id number]) Entered By: Regan Lemming on 09/07/2016 14:25:11 [name], [name] ([id number]) -------------------------------------------------------------------------------- Problem List Details Shimp, Anival Date of Service: 09/07/2016 2:15 PM Patient Name: M. Patient Account Number: [id number] Medical Record [name], [id number] Treating [name]: Number: [name] Date of Birth/Sex: 07-15-1966 (51 y.o. 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 ([id number]) -------------------------------------------------------------------------------- Progress Note Details [name] Date of Service: 09/07/2016 2:15 PM Patient Name: [name] Patient Account Number: [id number] Medical Record [name], [id number] Treating [name]: Number: [name] Date of Birth/Sex: Mar 18, 1966 (50 y.o. Male) Other Clinician: Primary Care Physician: SYSTEM, PCP Treating Britto, Errol Referring Physician: Lisa Roca Physician/Extender: Suella Grove in Treatment: 11 Subjective Chief Complaint Information obtained from  Patient Patients presents for treatment of an open diabetic ulcer to the right lower extremity near his car for about 3 months History of Present Illness (HPI) The following HPI elements were documented for the patient's wound: Location: right lower extremity on the lateral calf area Quality: Patient reports experiencing a sharp pain to affected area(s). Severity: Presents with a pain of ____10_on a pain scale of 1-10. Duration: Patient has had the wound for > 3 months prior to seeking treatment at the wound center Timing: Pain in wound is constant (hurts all the time) Context: The wound occurred when the patient may have scratched vigorously in his sleep because of his psoriasis Modifying Factors: Other treatment(s) tried include:takes treatment for psoriasis with local creams and UVB light Associated Signs and Symptoms: Patient reports presence of swelling 50 year old gentleman was seen recently in the ER last  week with a chief complaints of cellulitis and skin ulcer. He has a history of CHF, Psoriasis, hypertension, diabetes mellitus and peripheral neuropathy. 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 The ulcer was on his right lateral calf and has been there for several months and become larger with time. last hemoglobin A1c was 8.3% in May. He has never been a smoker. he had an ultrasound of the right lower extremity for a DVT study and this was normal. Chest x-ray showed no active disease. He was put on clindamycin 300 mg 3 times a day for 10 days. 08/09/2016 -- not been here for 3 weeks and has been doing well and doing his dressings regularly Objective [name], [name] ([id number]) Constitutional Pulse regular. Respirations normal and unlabored. Afebrile. Vitals Time Taken: 2:14 PM, Height: 64 in, Weight: 274 lbs, BMI: 47, Temperature: 99.1  F, Pulse: 88 bpm, Respiratory Rate: 20 breaths/min, Blood Pressure: 150/89 mmHg. Eyes Nonicteric. Reactive to light. Ears, Nose, Mouth, and Throat Lips, teeth, and gums WNL.[name] Kitchen Moist mucosa without lesions. Neck supple and nontender. No palpable supraclavicular or cervical adenopathy. Normal sized without goiter. Respiratory WNL. No retractions.. Cardiovascular Pedal Pulses WNL. No clubbing, cyanosis or edema. Lymphatic No adneopathy. No adenopathy. No adenopathy. Musculoskeletal Adexa without tenderness or enlargement.. Digits and nails w/o clubbing, cyanosis, infection, petechiae, ischemia, or inflammatory conditions.[name] Kitchen Psychiatric Judgement and insight Intact.. No evidence of depression, anxiety, or agitation.. General Notes: the wound looks very good today and did not need any sharp debridement but I washed it out well with moist saline gauze Integumentary (Hair, Skin) No suspicious lesions. No crepitus or fluctuance. No peri-wound warmth or erythema. No masses.. Wound #1 status is Open. Original cause of wound was Gradually Appeared. The wound is located on the Right,Lateral Lower Leg. The wound measures 1.3cm length x 1cm width x 0.1cm depth; 1.021cm^2 area and 0.102cm^3 volume. The wound is limited to skin breakdown. There is no tunneling or undermining noted. There is a medium amount of serosanguineous drainage noted. The wound margin is thickened. There is medium (34-66%) pink, pale granulation within the wound bed. There is a medium (34-66%) amount of necrotic tissue within the wound bed including Adherent Slough. The periwound skin appearance exhibited: Rash, Scarring, Moist, Hemosiderin Staining. The periwound skin appearance did not exhibit: Callus, Crepitus, Excoriation, Fluctuance, Friable, Induration, Localized Edema, Dry/Scaly, Maceration, Atrophie Blanche, Cyanosis, Ecchymosis, Mottled, Pallor, Rubor, Erythema. Periwound temperature was noted as No Abnormality. The  periwound has tenderness on palpation. YUTA, CIPOLLONE ([id number]) Assessment Active Problems ICD-10 E11.622 - Type 2 diabetes mellitus with other skin ulcer L97.212 - Non-pressure chronic ulcer of right calf with fat layer exposed L40.9 - Psoriasis, unspecified E66.01 - Morbid (severe) obesity due to excess calories Plan Wound Cleansing: Wound #1 Right,Lateral Lower Leg: Cleanse wound with mild soap and water Anesthetic: Wound #1 Right,Lateral Lower Leg: Topical Lidocaine 4% cream applied to wound bed prior to debridement Primary Wound Dressing: Wound #1 Right,Lateral Lower Leg: Prisma Ag Secondary Dressing: Wound #1 Right,Lateral Lower Leg: Non-adherent pad - telfa island Dressing Change Frequency: Wound #1 Right,Lateral Lower Leg: Change dressing every other day. Follow-up Appointments: Wound #1 Right,Lateral Lower Leg: Return Appointment in 1 week. Additional Orders / Instructions: Wound #1 Right,Lateral Lower Leg: Increase protein intake. Activity as tolerated I have asked him to continue with Silver collagen to be done on alternate days and continue with elevation and  exercise. DENNISE, BAMBER. ([id number]) he is urged to keep his diabetes and hypertension under control. I have also asked him to watch very carefully all his salt intake especially when he is eating out Electronic Signature(s) Signed: 09/07/2016 2:28:33 PM By: Christin Fudge MD, FACS Entered By: Christin Fudge on 09/07/2016 14:28:32 Folino, Thaine M. ([id number]) -------------------------------------------------------------------------------- SuperBill Details Lindbloom, Frederick Date of Service: 09/07/2016 Patient Name: [name] Patient Account Number: [id number] Medical Record [name], [id number] Treating [name]: Number: [name] Date of Birth/Sex: [birth year] (50 y.o. 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

## 2016-09-08 NOTE — Progress Notes (Signed)
MOSHE, WENGER ([id number]) Visit Report for 09/07/2016 Arrival Information Details Patient Name: [name], [name] Date of Service: 09/07/2016 2:15 PM Medical Record Number: [id number] Patient Account Number: [id number] Date of Birth/Sex: 1966/02/02 (50 y.o. Male) Treating RN: [name], RN, BSN, Velva Harman Primary Care Physician: SYSTEM, PCP Other Clinician: Referring Physician: Lisa Roca Treating Physician/Extender: Frann Rider in Treatment: 11 Visit Information History Since Last Visit All ordered tests and consults were completed: No Patient Arrived: Ambulatory Added or deleted any medications: No Arrival Time: 14:12 Any new allergies or adverse reactions: No Accompanied By: self Had a fall or experienced change in No Transfer Assistance: None activities of daily living that may affect Patient Identification Verified: Yes risk of falls: Secondary Verification Process Yes Signs or symptoms of abuse/neglect since last No Completed: visito Patient Requires Transmission- No Hospitalized since last visit: No Based Precautions: Has Dressing in Place as Prescribed: Yes Patient Has Alerts: Yes Pain Present Now: No Patient Alerts: Patient on Blood Thinner Electronic Signature(s) Signed: 09/07/2016 4:34:10 PM By: Regan Lemming BSN, RN Entered By: Regan Lemming on 09/07/2016 14:13:10 Bourbon, Gorman M. ([id number]) -------------------------------------------------------------------------------- Clinic Level of Care Assessment Details Patient Name: [name] Date of Service: 09/07/2016 2:15 PM Medical Record Number: [id number] Patient Account Number: [id number] Date of Birth/Sex: September 17, 1966 (50 y.o. Male) Treating RN: [name], RN, BSN, [place] Primary Care Physician: SYSTEM, PCP Other Clinician: Referring Physician: Lisa Roca Treating Physician/Extender: Frann Rider in Treatment: 11 Clinic Level of Care Assessment Items TOOL 4 Quantity Score []  - Use  when only an EandM is performed on FOLLOW-UP visit 0 ASSESSMENTS - Nursing Assessment / Reassessment X - Reassessment of Co-morbidities (includes updates in patient status) 1 10 X - Reassessment of Adherence to Treatment Plan 1 5 ASSESSMENTS - Wound and Skin Assessment / Reassessment X - Simple Wound Assessment / Reassessment - one wound 1 5 []  - Complex Wound Assessment / Reassessment - multiple wounds 0 []  - Dermatologic / Skin Assessment (not related to wound area) 0 ASSESSMENTS - Focused Assessment []  - Circumferential Edema Measurements - multi extremities 0 []  - Nutritional Assessment / Counseling / Intervention 0 []  - Lower Extremity Assessment (monofilament, tuning fork, pulses) 0 []  - Peripheral Arterial Disease Assessment (using hand held doppler) 0 ASSESSMENTS - Ostomy and/or Continence Assessment and Care []  - Incontinence Assessment and Management 0 []  - Ostomy Care Assessment and Management (repouching, etc.) 0 PROCESS - Coordination of Care X - Simple Patient / Family Education for ongoing care 1 15 []  - Complex (extensive) Patient / Family Education for ongoing care 0 []  - Staff obtains Programmer, systems, Records, Test Results / Process Orders 0 []  - Staff telephones HHA, Nursing Homes / Clarify orders / etc 0 []  - Routine Transfer to another Facility (non-emergent condition) 0 Lang, Joby M. ([id number]) []  - Routine Hospital Admission (non-emergent condition) 0 []  - New Admissions / Biomedical engineer / Ordering NPWT, Apligraf, etc. 0 []  - Emergency Hospital Admission (emergent condition) 0 []  - Simple Discharge Coordination 0 []  - Complex (extensive) Discharge Coordination 0 PROCESS - Special Needs []  - Pediatric / Minor Patient Management 0 []  - Isolation Patient Management 0 []  - Hearing / Language / Visual special needs 0 []  - Assessment of Community assistance (transportation, D/C planning, etc.) 0 []  - Additional assistance / Altered mentation 0 []  -  Support Surface(s) Assessment (bed, cushion, seat, etc.) 0 INTERVENTIONS - Wound Cleansing / Measurement X - Simple Wound Cleansing - one wound 1 5 []  - Complex Wound  Cleansing - multiple wounds 0 X - Wound Imaging (photographs - any number of wounds) 1 5 []  - Wound Tracing (instead of photographs) 0 X - Simple Wound Measurement - one wound 1 5 []  - Complex Wound Measurement - multiple wounds 0 INTERVENTIONS - Wound Dressings X - Small Wound Dressing one or multiple wounds 1 10 []  - Medium Wound Dressing one or multiple wounds 0 []  - Large Wound Dressing one or multiple wounds 0 []  - Application of Medications - topical 0 []  - Application of Medications - injection 0 INTERVENTIONS - Miscellaneous []  - External ear exam 0 Sherrod, Gavino M. ([id number]) []  - Specimen Collection (cultures, biopsies, blood, body fluids, etc.) 0 []  - Specimen(s) / Culture(s) sent or taken to Lab for analysis 0 []  - Patient Transfer (multiple staff / Harrel Lemon Lift / Similar devices) 0 []  - Simple Staple / Suture removal (25 or less) 0 []  - Complex Staple / Suture removal (26 or more) 0 []  - Hypo / Hyperglycemic Management (close monitor of Blood Glucose) 0 []  - Ankle / Brachial Index (ABI) - do not check if billed separately 0 X - Vital Signs 1 5 Has the patient been seen at the hospital within the last three years: Yes Total Score: 65 Level Of Care: New/Established - Level 2 Electronic Signature(s) Signed: 09/07/2016 4:34:10 PM By: Regan Lemming BSN, RN Entered By: Regan Lemming on 09/07/2016 14:29:11 Marines, Davarius M. ([id number]) -------------------------------------------------------------------------------- Encounter Discharge Information Details Patient Name: [name] Date of Service: 09/07/2016 2:15 PM Medical Record Number: [id number] Patient Account Number: [id number] Date of Birth/Sex: Nov 08, 1966 (50 y.o. Male) Treating RN: [name], RN, BSN, [place] Primary Care Physician: SYSTEM,  PCP Other Clinician: Referring Physician: Lisa Roca Treating Physician/Extender: Frann Rider in Treatment: 11 Encounter Discharge Information Items Discharge Pain Level: 0 Discharge Condition: Stable Ambulatory Status: Ambulatory Discharge Destination: Home Transportation: Private Auto Accompanied By: self Schedule Follow-up Appointment: No Medication Reconciliation completed and provided to Patient/Care No Provider: Provided on Clinical Summary of Care: 09/07/2016 Form Type Recipient Paper Patient JT Electronic Signature(s) Signed: 09/07/2016 4:34:10 PM By: Regan Lemming BSN, RN Previous Signature: 09/07/2016 2:29:49 PM Version By: Ruthine Dose Entered By: Regan Lemming on 09/07/2016 Vilas, Oakley M. ([id number]) -------------------------------------------------------------------------------- Lower Extremity Assessment Details Patient Name: [name] Date of Service: 09/07/2016 2:15 PM Medical Record Number: [id number] Patient Account Number: [id number] Date of Birth/Sex: 08-23-1966 (50 y.o. Male) Treating RN: [name], RN, BSN, [place] Primary Care Physician: SYSTEM, PCP Other Clinician: Referring Physician: Lisa Roca Treating Physician/Extender: Frann Rider in Treatment: 11 Vascular Assessment Pulses: Posterior Tibial Dorsalis Pedis Palpable: [Right:Yes] Extremity colors, hair growth, and conditions: Extremity Color: [Right:Hyperpigmented] Hair Growth on Extremity: [Right:No] Temperature of Extremity: [Right:Warm] Capillary Refill: [Right:< 3 seconds] Electronic Signature(s) Signed: 09/07/2016 4:34:10 PM By: Regan Lemming BSN, RN Entered By: Regan Lemming on 09/07/2016 14:14:36 Gockley, Jeromiah M. ([id number]) -------------------------------------------------------------------------------- Multi Wound Chart Details Patient Name: [name] Date of Service: 09/07/2016 2:15 PM Medical Record Number: [id number] Patient  Account Number: [id number] Date of Birth/Sex: September 29, 1966 (50 y.o. Male) Treating RN: [name], RN, BSN, [place] Primary Care Physician: SYSTEM, PCP Other Clinician: Referring Physician: Lisa Roca Treating Physician/Extender: Frann Rider in Treatment: 11 Vital Signs Height(in): 64 Pulse(bpm): 88 Weight(lbs): 274 Blood Pressure 150/89 (mmHg): Body Mass Index(BMI): 47 Temperature(F): 99.1 Respiratory Rate 20 (breaths/min): Photos: [1:No Photos] [N/A:N/A] Wound Location: [1:Right Lower Leg - Lateral] [N/A:N/A] Wounding Event: [1:Gradually Appeared] [N/A:N/A] Primary Etiology: [1:Diabetic Wound/Ulcer of the Lower Extremity] [N/A:N/A] Comorbid History: [  1:Anemia, Sleep Apnea, Arrhythmia, Congestive Heart Failure, Hypertension, Peripheral Venous Disease, Type II Diabetes, Gout, Neuropathy] [N/A:N/A] Date Acquired: [1:04/23/2016] [N/A:N/A] Weeks of Treatment: [1:11] [N/A:N/A] Wound Status: [1:Open] [N/A:N/A] Measurements L x W x D 1.3x1x0.1 [N/A:N/A] (cm) Area (cm) : [1:1.021] [N/A:N/A] Volume (cm) : [1:0.102] [N/A:N/A] % Reduction in Area: [1:42.20%] [N/A:N/A] % Reduction in Volume: 88.50% [N/A:N/A] Classification: [1:Grade 1] [N/A:N/A] Exudate Amount: [1:Medium] [N/A:N/A] Exudate Type: [1:Serosanguineous] [N/A:N/A] Exudate Color: [1:red, brown] [N/A:N/A] Wound Margin: [1:Thickened] [N/A:N/A] Granulation Amount: [1:Medium (34-66%)] [N/A:N/A] Granulation Quality: [1:Pink, Pale, Hyper- granulation] [N/A:N/A] Necrotic Amount: [1:Medium (34-66%)] [N/A:N/A] Exposed Structures: Fascia: No N/A N/A Fat: No Tendon: No Muscle: No Joint: No Bone: No Limited to Skin Breakdown Epithelialization: Medium (34-66%) N/A N/A Periwound Skin Texture: Rash: Yes N/A N/A Scarring: Yes Edema: No Excoriation: No Induration: No Callus: No Crepitus: No Fluctuance: No Friable: No Periwound Skin Moist: Yes N/A N/A Moisture: Maceration: No Dry/Scaly: No Periwound Skin Color:  Hemosiderin Staining: Yes N/A N/A Atrophie Blanche: No Cyanosis: No Ecchymosis: No Erythema: No Mottled: No Pallor: No Rubor: No Temperature: No Abnormality N/A N/A Tenderness on Yes N/A N/A Palpation: Wound Preparation: Ulcer Cleansing: N/A N/A Rinsed/Irrigated with Saline Topical Anesthetic Applied: Other: lidocaine 4% Treatment Notes Electronic Signature(s) Signed: 09/07/2016 4:34:10 PM By: Regan Lemming BSN, RN Entered By: Regan Lemming on 09/07/2016 14:18:58 Reisz, Jeyren MMarland Kitchen ([id number]) -------------------------------------------------------------------------------- Multi-Disciplinary Care Plan Details Patient Name: [name] Date of Service: 09/07/2016 2:15 PM Medical Record Number: [id number] Patient Account Number: [id number] Date of Birth/Sex: [birth year] (50 y.o. Male) Treating RN: [name], RN, BSN, Velva Harman Primary Care Physician: SYSTEM, PCP Other Clinician: Referring Physician: Lisa Roca Treating Physician/Extender: Frann Rider in Treatment: 11 Active Inactive Orientation to the Wound Care Program Nursing Diagnoses: Knowledge deficit related to the wound healing center program Goals: Patient/caregiver will verbalize understanding of the [facility] Date Initiated: 06/18/2016 Goal Status: Active Interventions: Provide education on orientation to the wound center Notes: Venous Leg Ulcer Nursing Diagnoses: Knowledge deficit related to disease process and management Potential for venous Insuffiency (use before diagnosis confirmed) Goals: Patient will maintain optimal edema control Date Initiated: 06/18/2016 Goal Status: Active Patient/caregiver will verbalize understanding of disease process and disease management Date Initiated: 06/18/2016 Goal Status: Active Verify adequate tissue perfusion prior to therapeutic compression application Date Initiated: 06/18/2016 Goal Status: Active Interventions: Compression as  ordered Provide education on venous insufficiency Notes: BREVEN, GUIDROZ ([id number]) Wound/Skin Impairment Nursing Diagnoses: Impaired tissue integrity Goals: Patient/caregiver will verbalize understanding of skin care regimen Date Initiated: 06/18/2016 Goal Status: Active Ulcer/skin breakdown will have a volume reduction of 30% by week 4 Date Initiated: 06/18/2016 Goal Status: Active Ulcer/skin breakdown will have a volume reduction of 50% by week 8 Date Initiated: 06/18/2016 Goal Status: Active Ulcer/skin breakdown will have a volume reduction of 80% by week 12 Date Initiated: 06/18/2016 Goal Status: Active Ulcer/skin breakdown will heal within 14 weeks Date Initiated: 06/18/2016 Goal Status: Active Interventions: Assess patient/caregiver ability to obtain necessary supplies Assess patient/caregiver ability to perform ulcer/skin care regimen upon admission and as needed Assess ulceration(s) every visit Provide education on ulcer and skin care Treatment Activities: Skin care regimen initiated : 06/18/2016 Topical wound management initiated : 06/18/2016 Notes: Electronic Signature(s) Signed: 09/07/2016 4:34:10 PM By: Regan Lemming BSN, RN Entered By: Regan Lemming on 09/07/2016 14:18:51 Pettingill, Valentine M. ([id number]) -------------------------------------------------------------------------------- Pain Assessment Details Patient Name: [name] Date of Service: 09/07/2016 2:15 PM Medical Record Number: [id number] Patient Account Number: [id number] Date of Birth/Sex: 1966/03/27 (50  y.o. Male) Treating RN: [name], RN, BSN, Velva Harman Primary Care Physician: SYSTEM, PCP Other Clinician: Referring Physician: Lisa Roca Treating Physician/Extender: Frann Rider in Treatment: 11 Active Problems Location of Pain Severity and Description of Pain Patient Has Paino No Site Locations With Dressing Change: No Pain Management and Medication Current Pain Management: Electronic  Signature(s) Signed: 09/07/2016 4:34:10 PM By: Regan Lemming BSN, RN Entered By: Regan Lemming on 09/07/2016 14:14:21 Crate, Cote M. ([id number]) -------------------------------------------------------------------------------- Patient/Caregiver Education Details Patient Name: [name] Date of Service: 09/07/2016 2:15 PM Medical Record Number: [id number] Patient Account Number: [id number] Date of Birth/Gender: 1966/07/24 (50 y.o. Male) Treating RN: [name], RN, BSN, [place] Primary Care Physician: SYSTEM, PCP Other Clinician: Referring Physician: Lisa Roca Treating Physician/Extender: Frann Rider in Treatment: 11 Education Assessment Education Provided To: Patient Education Topics Provided Venous: Methods: Explain/Verbal Responses: State content correctly Welcome To [facility]: Methods: Explain/Verbal Responses: State content correctly Wound/Skin Impairment: Methods: Explain/Verbal Responses: State content correctly Electronic Signature(s) Signed: 09/07/2016 4:34:10 PM By: Regan Lemming BSN, RN Entered By: Regan Lemming on 09/07/2016 14:31:17 Fay, Malakai M. ([id number]) -------------------------------------------------------------------------------- Wound Assessment Details Patient Name: [name] Date of Service: 09/07/2016 2:15 PM Medical Record Number: [id number] Patient Account Number: [id number] Date of Birth/Sex: [birth year] (50 y.o. Male) Treating RN: [name], RN, BSN, [place] Primary Care Physician: SYSTEM, PCP Other Clinician: Referring Physician: Lisa Roca Treating Physician/Extender: Frann Rider in Treatment: 11 Wound Status Wound Number: 1 Primary Diabetic Wound/Ulcer of the Lower Etiology: Extremity Wound Location: Right Lower Leg - Lateral Wound Open Wounding Event: Gradually Appeared Status: Date Acquired: 04/23/2016 Comorbid Anemia, Sleep Apnea, Arrhythmia, Weeks Of Treatment: 11 History: Congestive Heart  Failure, Hypertension, Clustered Wound: No Peripheral Venous Disease, Type II Diabetes, Gout, Neuropathy Photos Photo Uploaded By: Regan Lemming on 09/07/2016 16:31:16 Wound Measurements Length: (cm) 1.3 Width: (cm) 1 Depth: (cm) 0.1 Area: (cm) 1.021 Volume: (cm) 0.102 % Reduction in Area: 42.2% % Reduction in Volume: 88.5% Epithelialization: Medium (34-66%) Tunneling: No Undermining: No Wound Description Classification: Grade 1 Wound Margin: Thickened Exudate Amount: Medium Exudate Type: Serosanguineous Exudate Color: red, brown Foul Odor After Cleansing: No Wound Bed Granulation Amount: Medium (34-66%) Exposed Structure Granulation Quality: Pink, Pale, Hyper-granulation Fascia Exposed: No Necrotic Amount: Medium (34-66%) Fat Layer Exposed: No Digeronimo, Joab M. ([id number]) Necrotic Quality: Adherent Slough Tendon Exposed: No Muscle Exposed: No Joint Exposed: No Bone Exposed: No Limited to Skin Breakdown Periwound Skin Texture Texture Color No Abnormalities Noted: No No Abnormalities Noted: No Callus: No Atrophie Blanche: No Crepitus: No Cyanosis: No Excoriation: No Ecchymosis: No Fluctuance: No Erythema: No Friable: No Hemosiderin Staining: Yes Induration: No Mottled: No Localized Edema: No Pallor: No Rash: Yes Rubor: No Scarring: Yes Temperature / Pain Moisture Temperature: No Abnormality No Abnormalities Noted: No Tenderness on Palpation: Yes Dry / Scaly: No Maceration: No Moist: Yes Wound Preparation Ulcer Cleansing: Rinsed/Irrigated with Saline Topical Anesthetic Applied: Other: lidocaine 4%, Treatment Notes Wound #1 (Right, Lateral Lower Leg) 1. Cleansed with: Clean wound with Normal Saline 4. Dressing Applied: Prisma Ag 5. Secondary [place] Signature(s) Signed: 09/07/2016 4:34:10 PM By: Regan Lemming BSN, RN Entered By: Regan Lemming on 09/07/2016 14:18:46 Tortorella, Ethelbert Jerilynn Mages  ([id number]) -------------------------------------------------------------------------------- Vitals Details Patient Name: [name] Date of Service: 09/07/2016 2:15 PM Medical Record Number: [id number] Patient Account Number: [id number] Date of Birth/Sex: 1966/02/16 (50 y.o. Male) Treating RN: [name], RN, BSN, Velva Harman Primary Care Physician: SYSTEM, PCP Other Clinician: Referring Physician: Lisa Roca Treating Physician/Extender: Con Memos,  Errol Weeks in Treatment: 11 Vital Signs Time Taken: 14:14 Temperature (F): 99.1 Height (in): 64 Pulse (bpm): 88 Weight (lbs): 274 Respiratory Rate (breaths/min): 20 Body Mass Index (BMI): 47 Blood Pressure (mmHg): 150/89 Reference Range: 80 - 120 mg / dl Electronic Signature(s) Signed: 09/07/2016 4:34:10 PM By: Regan Lemming BSN, RN Entered By: Regan Lemming on 09/07/2016 14:17:21

## 2016-09-13 ENCOUNTER — Ambulatory Visit: Payer: BLUE CROSS/BLUE SHIELD | Admitting: Surgery

## 2016-10-04 ENCOUNTER — Encounter: Payer: BLUE CROSS/BLUE SHIELD | Attending: Surgery | Admitting: Surgery

## 2016-10-04 DIAGNOSIS — M109 Gout, unspecified: Secondary | ICD-10-CM | POA: Insufficient documentation

## 2016-10-04 DIAGNOSIS — E1140 Type 2 diabetes mellitus with diabetic neuropathy, unspecified: Secondary | ICD-10-CM | POA: Diagnosis not present

## 2016-10-04 DIAGNOSIS — F419 Anxiety disorder, unspecified: Secondary | ICD-10-CM | POA: Insufficient documentation

## 2016-10-04 DIAGNOSIS — E11622 Type 2 diabetes mellitus with other skin ulcer: Secondary | ICD-10-CM | POA: Insufficient documentation

## 2016-10-04 DIAGNOSIS — I5042 Chronic combined systolic (congestive) and diastolic (congestive) heart failure: Secondary | ICD-10-CM | POA: Insufficient documentation

## 2016-10-04 DIAGNOSIS — Z6841 Body Mass Index (BMI) 40.0 and over, adult: Secondary | ICD-10-CM | POA: Diagnosis not present

## 2016-10-04 DIAGNOSIS — G4733 Obstructive sleep apnea (adult) (pediatric): Secondary | ICD-10-CM | POA: Insufficient documentation

## 2016-10-04 DIAGNOSIS — I110 Hypertensive heart disease with heart failure: Secondary | ICD-10-CM | POA: Insufficient documentation

## 2016-10-04 DIAGNOSIS — L409 Psoriasis, unspecified: Secondary | ICD-10-CM | POA: Diagnosis not present

## 2016-10-04 DIAGNOSIS — L97212 Non-pressure chronic ulcer of right calf with fat layer exposed: Secondary | ICD-10-CM | POA: Insufficient documentation

## 2016-10-05 NOTE — Progress Notes (Signed)
JAQUANE, BOUGHNER ([id number]) Visit Report for 10/04/2016 Chief Complaint Document Details Golab, Andrea 10/04/2016 8:00 Patient Name: Date of Service: M. AM Medical Record Patient Account Number: [id number] Number: Treating RN: Cornell Barman Date of Birth/Sex: [birth year] (50 y.o. Male) Other Clinician: Primary Care Physician: SYSTEM, PCP Treating Christin Fudge Referring Physician: Lisa Roca Physician/Extender: Suella Grove in Treatment: 15 Information Obtained from: Patient Chief Complaint Patients presents for treatment of an open diabetic ulcer to the right lower extremity near his car for about 3 months Electronic Signature(s) Signed: 10/04/2016 8:40:26 AM By: Christin Fudge MD, FACS Entered By: Christin Fudge on 10/04/2016 08:40:26 Orsak, Nelvin M. ([id number]) -------------------------------------------------------------------------------- Debridement Details Scheunemann, Chaise 10/04/2016 8:00 Patient Name: Date of Service: M. AM Medical Record Patient Account Number: [id number] Number: Treating RN: Cornell Barman Date of Birth/Sex: [birth year] (50 y.o. Male) Other Clinician: Primary Care Physician: SYSTEM, PCP Treating Britto, Errol Referring Physician: Lisa Roca Physician/Extender: Suella Grove in Treatment: 15 Debridement Performed for Wound #1 Right,Lateral Lower Leg Assessment: Performed By: Physician Christin Fudge, MD Debridement: Debridement Pre-procedure Yes - 08:16 Verification/Time Out Taken: Start Time: 08:16 Pain Control: Lidocaine 4% Topical Solution Level: Skin/Subcutaneous Tissue Total Area Debrided (L x 1.1 (cm) x 1 (cm) = 1.1 (cm) W): Tissue and other Non-Viable, Exudate, Fibrin/Slough, Subcutaneous material debrided: Instrument: Other : gauze and saline Bleeding: Minimum Hemostasis Achieved: Pressure End Time: 08:18 Procedural Pain: 0 Post Procedural Pain: 0 Response to Treatment: Procedure was tolerated well Post  Debridement Measurements of Total Wound Length: (cm) 1.1 Width: (cm) 1 Depth: (cm) 0.1 Volume: (cm) 0.086 Character of Wound/Ulcer Post Stable Debridement: Severity of Tissue Post Debridement: Fat layer exposed Post Procedure Diagnosis Same as Pre-procedure Electronic Signature(s) Signed: 10/04/2016 8:40:18 AM By: Christin Fudge MD, FACS Signed: 10/04/2016 4:11:14 PM By: Gretta Cool RN, BSN, Kim RN, BSN Peth, Delando M. ([id number]) Entered By: Christin Fudge on 10/04/2016 08:40:18 Lipkin, Raysean M. ([id number]) -------------------------------------------------------------------------------- HPI Details Nevils, [place] 10/04/2016 8:00 Patient Name: Date of Service: M. AM Medical Record Patient Account Number: [id number] Number: Treating RN: Cornell Barman Date of Birth/Sex: 17-Jan-1966 (50 y.o. Male) Other Clinician: Primary Care Physician: SYSTEM, PCP Treating Christin Fudge Referring Physician: Lisa Roca Physician/Extender: Weeks in Treatment: 15 History of Present Illness Location: right lower extremity on the lateral calf area Quality: Patient reports experiencing a sharp pain to affected area(s). Severity: Presents with a pain of ____10_on a pain scale of 1-10. Duration: Patient has had the wound for > 3 months prior to seeking treatment at the wound center Timing: Pain in wound is constant (hurts all the time) Context: The wound occurred when the patient may have scratched vigorously in his sleep because of his psoriasis Modifying Factors: Other treatment(s) tried include:takes treatment for psoriasis with local creams and UVB light Associated Signs and Symptoms: Patient reports presence of swelling HPI Description: 50 year old gentleman was seen recently in the ER last week with a chief complaints of cellulitis and skin ulcer. He has a history of CHF, Psoriasis, hypertension, diabetes mellitus and peripheral neuropathy. 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 The ulcer was on his right lateral calf and has been there for several months and become larger with time. last hemoglobin A1c was 8.3% in May. He has never been a smoker. he had an ultrasound of the right lower extremity for a DVT study and this was normal. Chest x-ray showed no active disease. He was  put on clindamycin 300 mg 3 times a day for 10 days. 08/09/2016 -- not been here for 3 weeks and has been doing well and doing his dressings regularly. 10/04/2016 -- he has not been back for about a month but says he was ill with what sounds like bronchitis or CHF exacerbation. He was treated with antibiotics and fluid pills as per his verbal report Electronic Signature(s) Signed: 10/04/2016 8:41:12 AM By: Christin Fudge MD, FACS Entered By: Christin Fudge on 10/04/2016 [place], Yiannis M. ([id number]) -------------------------------------------------------------------------------- Physical Exam Details Faraone, Nino 10/04/2016 8:00 Patient Name: Date of Service: M. AM Medical Record Patient Account Number: [id number] Number: Treating RN: Cornell Barman Date of Birth/Sex: Jul 07, 1966 (50 y.o. Male) Other Clinician: Primary Care Physician: SYSTEM, PCP Treating Christin Fudge Referring Physician: Lisa Roca Physician/Extender: Weeks in Treatment: 15 Constitutional . Pulse regular. Respirations normal and unlabored. Afebrile. . Eyes Nonicteric. Reactive to light. Ears, Nose, Mouth, and Throat Lips, teeth, and gums WNL.[name] Kitchen Moist mucosa without lesions. Neck supple and nontender. No palpable supraclavicular or cervical adenopathy. Normal sized without goiter. Respiratory WNL. No retractions.. Breath sounds WNL, No rubs, rales, rhonchi, or wheeze.. Cardiovascular Heart rhythm and rate regular, no murmur or gallop.. Pedal  Pulses WNL. No clubbing, cyanosis or edema. Lymphatic No adneopathy. No adenopathy. No adenopathy. Musculoskeletal Adexa without tenderness or enlargement.. Digits and nails w/o clubbing, cyanosis, infection, petechiae, ischemia, or inflammatory conditions.. Integumentary (Hair, Skin) No suspicious lesions. No crepitus or fluctuance. No peri-wound warmth or erythema. No masses.[name] Kitchen Psychiatric Judgement and insight Intact.. No evidence of depression, anxiety, or agitation.. Notes the wound is shallow and saucerized and has some subcutaneous debris which I was able to remove with the abrasive saline gauze and minimal bleeding controlled with pressure. Electronic Signature(s) Signed: 10/04/2016 8:41:45 AM By: Christin Fudge MD, FACS Entered By: Christin Fudge on 10/04/2016 08:41:44 Pippenger, Delmont M. ([id number]) -------------------------------------------------------------------------------- Physician Orders Details Housewright, Virginio 10/04/2016 8:00 Patient Name: Date of Service: M. AM Medical Record Patient Account Number: [id number] Number: Treating RN: [name], RN, BSN, Velva Harman Date of Birth/Sex: [birth year] (50 y.o. Male) Other Clinician: Primary Care Physician: SYSTEM, PCP Treating Britto, Errol Referring Physician: Lisa Roca Physician/Extender: Suella Grove in Treatment: 15 Verbal / Phone Orders: Yes Clinician: [name], RN, BSN, [name] Read Back and Verified: Yes Diagnosis Coding Wound Cleansing Wound #1 Right,Lateral Lower Leg o Cleanse wound with mild soap and water Anesthetic Wound #1 Right,Lateral Lower Leg o Topical Lidocaine 4% cream applied to wound bed prior to debridement Primary Wound Dressing Wound #1 Right,Lateral Lower Leg o Hydrafera Blue Secondary Dressing Wound #1 Right,Lateral Lower Leg o Non-adherent pad - Coverlet Bandaid Dressing Change Frequency Wound #1 Right,Lateral Lower Leg o Change dressing every other day. Follow-up  Appointments Wound #1 Right,Lateral Lower Leg o Return Appointment in 1 week. Additional Orders / Instructions Wound #1 Right,Lateral Lower Leg o Increase protein intake. o Activity as tolerated Electronic Signature(s) Signed: 10/04/2016 3:58:39 PM By: Christin Fudge MD, FACS Signed: 10/04/2016 5:42:32 PM By: Regan Lemming BSN, RN Stillinger, Dailyn M. ([id number]) Entered By: Regan Lemming on 10/04/2016 08:21:00 Lightle, Meshulem M. ([id number]) -------------------------------------------------------------------------------- Problem List Details Russman, Brayn 10/04/2016 8:00 Patient Name: Date of Service: M. AM Medical Record Patient Account Number: [id number] Number: Treating RN: Cornell Barman Date of Birth/Sex: 04-28-1966 (50 y.o. 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 ([id number]) -------------------------------------------------------------------------------- Progress Note Details Puffenbarger, Keisean 10/04/2016 8:00 Patient Name: Date of Service: M. AM Medical Record Patient Account Number: [id number] Number: Treating RN: Cornell Barman Date of Birth/Sex: August 30, 1966 (50 y.o. Male) Other Clinician: Primary Care Physician: SYSTEM, PCP Treating Britto, Errol Referring Physician: Lisa Roca Physician/Extender: Suella Grove in Treatment: 15 Subjective Chief Complaint Information obtained from Patient Patients presents for treatment of an open diabetic ulcer to the right lower extremity near his car for about 3 months History of Present Illness (HPI) The following HPI elements were documented for the patient's wound: Location: right lower extremity on the lateral calf area Quality: Patient reports experiencing a sharp pain to affected area(s). Severity: Presents with a pain of ____10_on a pain scale of 1-10. Duration: Patient has had the wound for > 3 months prior to seeking treatment at the wound center Timing: Pain in wound is constant (hurts all the time) Context: The wound occurred when the patient may have scratched vigorously in his sleep because of his psoriasis Modifying Factors: Other treatment(s) tried include:takes treatment for psoriasis with local creams and UVB light Associated Signs and Symptoms: Patient reports presence of swelling 50 year old gentleman was seen recently in the ER last week with a chief complaints of cellulitis and skin ulcer. He has a history of CHF, Psoriasis, hypertension, diabetes mellitus and peripheral neuropathy. 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 The ulcer was on his right lateral calf and has been there for several months and become larger with time. last hemoglobin A1c was 8.3% in May. He has never been a smoker. he had an ultrasound of the right lower extremity for a DVT study and this was normal. Chest x-ray showed no active disease. He was put on clindamycin 300 mg 3 times a day for 10 days. 08/09/2016 -- not been here for 3 weeks and has been doing well and doing his dressings regularly. 10/04/2016 -- he has not been back for about a month but says he was ill with what sounds like  bronchitis or CHF exacerbation. He was treated with antibiotics and fluid pills as per his verbal report COLSON, Jurrell M. ([id number]) Objective Constitutional Pulse regular. Respirations normal and unlabored. Afebrile. Vitals Time Taken: 8:04 AM, Height: 64 in, Weight: 274 lbs, BMI: 47, Temperature: 98.2 F, Pulse: 79 bpm, Respiratory Rate: 18 breaths/min, Blood Pressure: 139/81 mmHg. Eyes Nonicteric. Reactive to light. Ears, Nose, Mouth, and Throat Lips, teeth, and gums WNL.[name] Kitchen Moist mucosa without lesions. Neck supple and nontender. No palpable supraclavicular or cervical adenopathy. Normal sized without goiter. Respiratory WNL. No retractions.. Breath sounds WNL, No rubs, rales, rhonchi, or wheeze.. Cardiovascular Heart rhythm and rate regular, no murmur or gallop.. Pedal Pulses WNL. No clubbing, cyanosis or edema. Lymphatic No adneopathy. No adenopathy. No adenopathy. Musculoskeletal Adexa without tenderness or enlargement.. Digits and nails w/o clubbing, cyanosis, infection, petechiae, ischemia, or inflammatory conditions.[name] Kitchen Psychiatric Judgement and insight Intact.. No evidence of depression, anxiety, or agitation.. General Notes: the wound is shallow and saucerized and has some subcutaneous debris which I was able to remove with  the abrasive saline gauze and minimal bleeding controlled with pressure. Integumentary (Hair, Skin) No suspicious lesions. No crepitus or fluctuance. No peri-wound warmth or erythema. No masses.. Wound #1 status is Open. Original cause of wound was Gradually Appeared. The wound is located on the Right,Lateral Lower Leg. The wound measures 1.1cm length x 1cm width x 0.1cm depth; 0.864cm^2 area and 0.086cm^3 volume. The wound is limited to skin breakdown. There is no tunneling or undermining noted. There is a medium amount of serosanguineous drainage noted. The wound margin is thickened. There is medium (34-66%) pink granulation within the wound bed.  There is a medium (34-66%) amount of necrotic tissue within the wound bed including Adherent Slough. The periwound skin appearance exhibited: Rash, Scarring, Moist, Hemosiderin Staining. The periwound skin appearance did not exhibit: Callus, Crepitus, Excoriation, Fluctuance, Friable, Induration, Localized Edema, Dry/Scaly, Maceration, Atrophie Yankee, Billy M. ([id number]) Blanche, Cyanosis, Ecchymosis, Mottled, Pallor, Rubor, Erythema. Periwound temperature was noted as No Abnormality. The periwound has tenderness on palpation. Assessment Active Problems ICD-10 E11.622 - Type 2 diabetes mellitus with other skin ulcer L97.212 - Non-pressure chronic ulcer of right calf with fat layer exposed L40.9 - Psoriasis, unspecified E66.01 - Morbid (severe) obesity due to excess calories Procedures Wound #1 Wound #1 is a Diabetic Wound/Ulcer of the Lower Extremity located on the Right,Lateral Lower Leg . There was a Skin/Subcutaneous Tissue Debridement ([id number]) debridement with total area of 1.1 sq cm performed by Christin Fudge, MD. with the following instrument(s): gauze and saline to remove Non-Viable tissue/material including Exudate, Fibrin/Slough, and Subcutaneous after achieving pain control using Lidocaine 4% Topical Solution. A time out was conducted at 08:16, prior to the start of the procedure. A Minimum amount of bleeding was controlled with Pressure. The procedure was tolerated well with a pain level of 0 throughout and a pain level of 0 following the procedure. Post Debridement Measurements: 1.1cm length x 1cm width x 0.1cm depth; 0.086cm^3 volume. Character of Wound/Ulcer Post Debridement is stable. Severity of Tissue Post Debridement is: Fat layer exposed. Post procedure Diagnosis Wound #1: Same as Pre-Procedure Plan Wound Cleansing: Wound #1 Right,Lateral Lower Leg: Cleanse wound with mild soap and water Anesthetic: Wound #1 Right,Lateral Lower Leg: Topical Lidocaine 4%  cream applied to wound bed prior to debridement Seago, Vernice M. ([id number]) Primary Wound Dressing: Wound #1 Right,Lateral Lower Leg: Hydrafera Blue Secondary Dressing: Wound #1 Right,Lateral Lower Leg: Non-adherent pad - Coverlet Bandaid Dressing Change Frequency: Wound #1 Right,Lateral Lower Leg: Change dressing every other day. Follow-up Appointments: Wound #1 Right,Lateral Lower Leg: Return Appointment in 1 week. Additional Orders / Instructions: Wound #1 Right,Lateral Lower Leg: Increase protein intake. Activity as tolerated I have asked him to continue with elevation and exercise and because of his psoriasis it's very difficult to peter him in compression stockings. He will use Hydrofera Blue already over his wound on alternate days and will continue to work on his diuresis and watch his salt intake. Electronic Signature(s) Signed: 10/04/2016 8:42:22 AM By: Christin Fudge MD, FACS Entered By: Christin Fudge on 10/04/2016 08:42:22 [place]ent, [place] Jerilynn Mages ([id number]) -------------------------------------------------------------------------------- SuperBill Details Patient Name: [name] Date of Service: 10/04/2016 Medical Record Number: [id number] Patient Account Number: [id number] Date of Birth/Sex: February 23, 1966 (50 y.o. Male) Treating RN: Cornell Barman Primary Care Physician: SYSTEM, PCP Other Clinician: Referring Physician: Lisa Roca Treating Physician/Extender: Frann Rider in Treatment: 15 Diagnosis Coding ICD-10 Codes Code Description E11.622 Type 2 diabetes mellitus with other skin ulcer L97.212 Non-pressure chronic ulcer of right calf with fat  layer exposed L40.9 Psoriasis, unspecified E66.01 Morbid (severe) obesity due to excess calories Facility Procedures CPT4 Code: [id number] Description: 16384 - DEB SUBQ TISSUE 20 SQ CM/< ICD-10 Description Diagnosis E11.622 Type 2 diabetes mellitus with other skin ulcer L97.212 Non-pressure chronic ulcer  of right calf with fat L40.9 Psoriasis, unspecified E66.01 Morbid (severe) obesity due  to excess calories Modifier: layer exposed Quantity: 1 Physician Procedures CPT4 Code: [id number] Description: 70177 - WC PHYS SUBQ TISS 20 SQ CM ICD-10 Description Diagnosis E11.622 Type 2 diabetes mellitus with other skin ulcer L97.212 Non-pressure chronic ulcer of right calf with fat L40.9 Psoriasis, unspecified E66.01 Morbid (severe) obesity due  to excess calories Modifier: layer exposed Quantity: 1 Electronic Signature(s) Signed: 10/04/2016 8:42:34 AM By: Christin Fudge MD, FACS Entered By: Christin Fudge on 10/04/2016 08:42:34

## 2016-10-05 NOTE — Progress Notes (Signed)
KIAM, BRANSFIELD ([id number]) Visit Report for 10/04/2016 Arrival Information Details Patient Name: [name], [name] Date of Service: 10/04/2016 8:00 AM Medical Record Number: [id number] Patient Account Number: [id number] Date of Birth/Sex: July 19, 1966 (50 y.o. Male) Treating RN: Cornell Barman Primary Care Physician: SYSTEM, PCP Other Clinician: Referring Physician: Lisa Roca Treating Physician/Extender: Frann Rider in Treatment: 15 Visit Information History Since Last Visit Added or deleted any medications: No Patient Arrived: Ambulatory Any new allergies or adverse reactions: No Arrival Time: 08:03 Had a fall or experienced change in No Accompanied By: self activities of daily living that may affect Transfer Assistance: None risk of falls: Patient Identification Verified: Yes Signs or symptoms of abuse/neglect since last No Secondary Verification Process Yes visito Completed: Hospitalized since last visit: No Patient Requires Transmission- No Has Dressing in Place as Prescribed: No Based Precautions: Pain Present Now: No Patient Has Alerts: Yes Patient Alerts: Patient on Blood Thinner Electronic Signature(s) Signed: 10/04/2016 4:11:14 PM By: Gretta Cool, RN, BSN, Kim RN, BSN Entered By: Gretta Cool, RN, BSN, Kim on 10/04/2016 08:04:03 Paar, Adith Jerilynn Mages ([id number]) -------------------------------------------------------------------------------- Encounter Discharge Information Details Patient Name: [name] Date of Service: 10/04/2016 8:00 AM Medical Record Number: [id number] Patient Account Number: [id number] Date of Birth/Sex: [birth year] (50 y.o. Male) Treating RN: Cornell Barman Primary Care Physician: SYSTEM, PCP Other Clinician: Referring Physician: Lisa Roca Treating Physician/Extender: Frann Rider in Treatment: 15 Encounter Discharge Information Items Discharge Pain Level: 0 Discharge Condition: Stable Ambulatory Status:  Ambulatory Discharge Destination: Home Transportation: Private Auto Accompanied By: self Schedule Follow-up Appointment: Yes Medication Reconciliation completed and provided to Patient/Care Yes Provider: Provided on Clinical Summary of Care: 10/04/2016 Form Type Recipient Paper Patient JT Electronic Signature(s) Signed: 10/04/2016 4:11:14 PM By: Gretta Cool, RN, BSN, Kim RN, BSN Previous Signature: 10/04/2016 8:23:50 AM Version By: Ruthine Dose Entered By: Gretta Cool RN, BSN, Kim on 10/04/2016 08:25:30 Enriques, Mclain Jerilynn Mages ([id number]) -------------------------------------------------------------------------------- Lower Extremity Assessment Details Patient Name: [name] Date of Service: 10/04/2016 8:00 AM Medical Record Number: [id number] Patient Account Number: [id number] Date of Birth/Sex: Mar 23, 1966 (50 y.o. Male) Treating RN: Cornell Barman Primary Care Physician: SYSTEM, PCP Other Clinician: Referring Physician: Lisa Roca Treating Physician/Extender: Frann Rider in Treatment: 15 Vascular Assessment Pulses: Posterior Tibial Dorsalis Pedis Palpable: [Right:Yes] Extremity colors, hair growth, and conditions: Extremity Color: [Right:Mottled] Hair Growth on Extremity: [Right:Yes] Temperature of Extremity: [Right:Warm] Capillary Refill: [Right:< 3 seconds] Dependent Rubor: [Right:No] Blanched when Elevated: [Right:No] Lipodermatosclerosis: [Right:No] Toe Nail Assessment Left: Right: Thick: Yes Discolored: Yes Deformed: Yes Improper Length and Hygiene: Yes Electronic Signature(s) Signed: 10/04/2016 4:11:14 PM By: Gretta Cool, RN, BSN, Kim RN, BSN Entered By: Gretta Cool, RN, BSN, Kim on 10/04/2016 08:08:10 Ligas, Shayaan Jerilynn Mages ([id number]) -------------------------------------------------------------------------------- Multi Wound Chart Details Patient Name: [name] Date of Service: 10/04/2016 8:00 AM Medical Record Number: [id number] Patient Account  Number: [id number] Date of Birth/Sex: December 26, 1965 (50 y.o. Male) Treating RN: [name], RN, BSN, [place] Primary Care Physician: SYSTEM, PCP Other Clinician: Referring Physician: Lisa Roca Treating Physician/Extender: Frann Rider in Treatment: 15 Vital Signs Height(in): 64 Pulse(bpm): 79 Weight(lbs): 274 Blood Pressure 139/81 (mmHg): Body Mass Index(BMI): 47 Temperature(F): 98.2 Respiratory Rate 18 (breaths/min): Photos: [N/A:N/A] Wound Location: Right Lower Leg - Lateral N/A N/A Wounding Event: Gradually Appeared N/A N/A Primary Etiology: Diabetic Wound/Ulcer of N/A N/A the Lower Extremity Comorbid History: Anemia, Sleep Apnea, N/A N/A Arrhythmia, Congestive Heart Failure, Hypertension, Peripheral Venous Disease, Type II Diabetes, Gout, Neuropathy Date Acquired: 04/23/2016 N/A N/A Weeks of Treatment: 15 N/A N/A Wound Status:  Open N/A N/A Measurements L x W x D 1.1x1x0.1 N/A N/A (cm) Area (cm) : 0.864 N/A N/A Volume (cm) : 0.086 N/A N/A % Reduction in Area: 51.10% N/A N/A % Reduction in Volume: 90.30% N/A N/A Classification: Grade 1 N/A N/A Exudate Amount: Medium N/A N/A Exudate Type: Serosanguineous N/A N/A Exudate Color: red, brown N/A N/A Wound Margin: Thickened N/A N/A Ashwath, Lasch Koron M. ([id number]) Granulation Amount: Medium (34-66%) N/A N/A Granulation Quality: Pink, Hyper-granulation N/A N/A Necrotic Amount: Medium (34-66%) N/A N/A Exposed Structures: Fascia: No N/A N/A Fat: No Tendon: No Muscle: No Joint: No Bone: No Limited to Skin Breakdown Epithelialization: Medium (34-66%) N/A N/A Periwound Skin Texture: Rash: Yes N/A N/A Scarring: Yes Edema: No Excoriation: No Induration: No Callus: No Crepitus: No Fluctuance: No Friable: No Periwound Skin Moist: Yes N/A N/A Moisture: Maceration: No Dry/Scaly: No Periwound Skin Color: Hemosiderin Staining: Yes N/A N/A Atrophie Blanche: No Cyanosis: No Ecchymosis: No Erythema:  No Mottled: No Pallor: No Rubor: No Temperature: No Abnormality N/A N/A Tenderness on Yes N/A N/A Palpation: Wound Preparation: Ulcer Cleansing: N/A N/A Rinsed/Irrigated with Saline Topical Anesthetic Applied: Other: lidocaine 4% Treatment Notes Electronic Signature(s) Signed: 10/04/2016 5:42:32 PM By: Regan Lemming BSN, RN Entered By: Regan Lemming on 10/04/2016 08:17:06 Gavilanes, Ziquan M. ([id number]) Freese, Taelyn M. ([id number]) -------------------------------------------------------------------------------- Multi-Disciplinary Care Plan Details Patient Name: [name] Date of Service: 10/04/2016 8:00 AM Medical Record Number: [id number] Patient Account Number: [id number] Date of Birth/Sex: [birth year] (50 y.o. Male) Treating RN: [name], RN, BSN, Velva Harman Primary Care Physician: SYSTEM, PCP Other Clinician: Referring Physician: Lisa Roca Treating Physician/Extender: Frann Rider in Treatment: 15 Active Inactive Orientation to the Wound Care Program Nursing Diagnoses: Knowledge deficit related to the wound healing center program Goals: Patient/caregiver will verbalize understanding of the [place] Program Date Initiated: 06/18/2016 Goal Status: Active Interventions: Provide education on orientation to the wound center Notes: Venous Leg Ulcer Nursing Diagnoses: Knowledge deficit related to disease process and management Potential for venous Insuffiency (use before diagnosis confirmed) Goals: Patient will maintain optimal edema control Date Initiated: 06/18/2016 Goal Status: Active Patient/caregiver will verbalize understanding of disease process and disease management Date Initiated: 06/18/2016 Goal Status: Active Verify adequate tissue perfusion prior to therapeutic compression application Date Initiated: 06/18/2016 Goal Status: Active Interventions: Compression as ordered Provide education on venous insufficiency Notes: DANEL, REQUENA ([id number]) Wound/Skin Impairment Nursing Diagnoses: Impaired tissue integrity Goals: Patient/caregiver will verbalize understanding of skin care regimen Date Initiated: 06/18/2016 Goal Status: Active Ulcer/skin breakdown will have a volume reduction of 30% by week 4 Date Initiated: 06/18/2016 Goal Status: Active Ulcer/skin breakdown will have a volume reduction of 50% by week 8 Date Initiated: 06/18/2016 Goal Status: Active Ulcer/skin breakdown will have a volume reduction of 80% by week 12 Date Initiated: 06/18/2016 Goal Status: Active Ulcer/skin breakdown will heal within 14 weeks Date Initiated: 06/18/2016 Goal Status: Active Interventions: Assess patient/caregiver ability to obtain necessary supplies Assess patient/caregiver ability to perform ulcer/skin care regimen upon admission and as needed Assess ulceration(s) every visit Provide education on ulcer and skin care Treatment Activities: Skin care regimen initiated : 06/18/2016 Topical wound management initiated : 06/18/2016 Notes: Electronic Signature(s) Signed: 10/04/2016 5:42:32 PM By: Regan Lemming BSN, RN Entered By: Regan Lemming on 10/04/2016 08:16:15 Meininger, Bentlie M. ([id number]) -------------------------------------------------------------------------------- Pain Assessment Details Patient Name: [name] Date of Service: 10/04/2016 8:00 AM Medical Record Number: [id number] Patient Account Number: [id number] Date of Birth/Sex: Jul 03, 1966 (50 y.o. Male) Treating RN: Cornell Barman Primary Care  Physician: SYSTEM, PCP Other Clinician: Referring Physician: Lisa Roca Treating Physician/Extender: Frann Rider in Treatment: 15 Active Problems Location of Pain Severity and Description of Pain Patient Has Paino No Site Locations With Dressing Change: No Pain Management and Medication Current Pain Management: Electronic Signature(s) Signed: 10/04/2016 4:11:14 PM By: Gretta Cool, RN, BSN, Kim RN,  BSN Entered By: Gretta Cool, RN, BSN, Kim on 10/04/2016 08:04:10 [name] ([id number]) -------------------------------------------------------------------------------- Patient/Caregiver Education Details Patient Name: [name] Date of Service: 10/04/2016 8:00 AM Medical Record Number: [id number] Patient Account Number: [id number] Date of Birth/Gender: [birth year] (50 y.o. Male) Treating RN: Cornell Barman Primary Care Physician: SYSTEM, PCP Other Clinician: Referring Physician: Lisa Roca Treating Physician/Extender: Frann Rider in Treatment: 15 Education Assessment Education Provided To: Patient Education Topics Provided Wound/Skin Impairment: Handouts: Caring for Your Ulcer, Other: continue wound care as prescribed Methods: Demonstration Responses: State content correctly Electronic Signature(s) Signed: 10/04/2016 4:11:14 PM By: Gretta Cool, RN, BSN, Kim RN, BSN Entered By: Gretta Cool, RN, BSN, Kim on 10/04/2016 08:25:50 Bahena, April Jerilynn Mages ([id number]) -------------------------------------------------------------------------------- Wound Assessment Details Patient Name: [name] Date of Service: 10/04/2016 8:00 AM Medical Record Number: [id number] Patient Account Number: [id number] Date of Birth/Sex: May 21, 1966 (50 y.o. Male) Treating RN: Cornell Barman Primary Care Physician: SYSTEM, PCP Other Clinician: Referring Physician: Lisa Roca Treating Physician/Extender: Frann Rider in Treatment: 15 Wound Status Wound Number: 1 Primary Diabetic Wound/Ulcer of the Lower Etiology: Extremity Wound Location: Right Lower Leg - Lateral Wound Open Wounding Event: Gradually Appeared Status: Date Acquired: 04/23/2016 Comorbid Anemia, Sleep Apnea, Arrhythmia, Weeks Of Treatment: 15 History: Congestive Heart Failure, Hypertension, Clustered Wound: No Peripheral Venous Disease, Type II Diabetes, Gout, Neuropathy Photos Wound Measurements Length: (cm)  1.1 Width: (cm) 1 Depth: (cm) 0.1 Area: (cm) 0.864 Volume: (cm) 0.086 % Reduction in Area: 51.1% % Reduction in Volume: 90.3% Epithelialization: Medium (34-66%) Tunneling: No Undermining: No Wound Description Classification: Grade 1 Wound Margin: Thickened Exudate Amount: Medium Exudate Type: Serosanguineous Exudate Color: red, brown Foul Odor After Cleansing: No Wound Bed Granulation Amount: Medium (34-66%) Exposed Structure Granulation Quality: Pink, Hyper-granulation Fascia Exposed: No Necrotic Amount: Medium (34-66%) Fat Layer Exposed: No Necrotic Quality: Adherent Slough Tendon Exposed: No Muscle Exposed: No Joint Exposed: No Venturino, Pace M. ([id number]) Bone Exposed: No Limited to Skin Breakdown Periwound Skin Texture Texture Color No Abnormalities Noted: No No Abnormalities Noted: No Callus: No Atrophie Blanche: No Crepitus: No Cyanosis: No Excoriation: No Ecchymosis: No Fluctuance: No Erythema: No Friable: No Hemosiderin Staining: Yes Induration: No Mottled: No Localized Edema: No Pallor: No Rash: Yes Rubor: No Scarring: Yes Temperature / Pain Moisture Temperature: No Abnormality No Abnormalities Noted: No Tenderness on Palpation: Yes Dry / Scaly: No Maceration: No Moist: Yes Wound Preparation Ulcer Cleansing: Rinsed/Irrigated with Saline Topical Anesthetic Applied: Other: lidocaine 4%, Treatment Notes Wound #1 (Right, Lateral Lower Leg) 1. Cleansed with: Clean wound with Normal Saline 2. Anesthetic Topical Lidocaine 4% cream to wound bed prior to debridement 4. Dressing Applied: Hydrafera Blue 7. Secured with Self adhesive bandage Notes coverlet bordered dressing Electronic Signature(s) Signed: 10/04/2016 4:11:14 PM By: Gretta Cool, RN, BSN, Kim RN, BSN Entered By: Gretta Cool, RN, BSN, Kim on 10/04/2016 08:07:43 Atkin, Noboru Jerilynn Mages ([id number]) -------------------------------------------------------------------------------- [place]  Details Patient Name: [name] Date of Service: 10/04/2016 8:00 AM Medical Record Number: [id number] Patient Account Number: [id number] Date of Birth/Sex: Apr 04, 1966 (50 y.o. Male) Treating RN: Cornell Barman Primary Care Physician: SYSTEM, PCP Other Clinician: Referring Physician: Lisa Roca Treating Physician/Extender: Frann Rider in Treatment: 64  Vital Signs Time Taken: 08:04 Temperature (F): 98.2 Height (in): 64 Pulse (bpm): 79 Weight (lbs): 274 Respiratory Rate (breaths/min): 18 Body Mass Index (BMI): 47 Blood Pressure (mmHg): 139/81 Reference Range: 80 - 120 mg / dl Electronic Signature(s) Signed: 10/04/2016 4:11:14 PM By: Gretta Cool, RN, BSN, Kim RN, BSN Entered By: Gretta Cool, RN, BSN, Kim on 10/04/2016 98:06:99

## 2016-10-12 ENCOUNTER — Ambulatory Visit: Payer: BLUE CROSS/BLUE SHIELD | Admitting: Surgery

## 2016-10-19 ENCOUNTER — Ambulatory Visit: Payer: BLUE CROSS/BLUE SHIELD | Admitting: Surgery

## 2016-11-02 ENCOUNTER — Encounter: Payer: BLUE CROSS/BLUE SHIELD | Attending: Surgery | Admitting: Surgery

## 2016-11-02 DIAGNOSIS — I5042 Chronic combined systolic (congestive) and diastolic (congestive) heart failure: Secondary | ICD-10-CM | POA: Insufficient documentation

## 2016-11-02 DIAGNOSIS — L97212 Non-pressure chronic ulcer of right calf with fat layer exposed: Secondary | ICD-10-CM | POA: Insufficient documentation

## 2016-11-02 DIAGNOSIS — E11622 Type 2 diabetes mellitus with other skin ulcer: Secondary | ICD-10-CM | POA: Insufficient documentation

## 2016-11-02 DIAGNOSIS — L409 Psoriasis, unspecified: Secondary | ICD-10-CM | POA: Insufficient documentation

## 2016-11-02 DIAGNOSIS — Z6841 Body Mass Index (BMI) 40.0 and over, adult: Secondary | ICD-10-CM | POA: Insufficient documentation

## 2016-11-02 DIAGNOSIS — I110 Hypertensive heart disease with heart failure: Secondary | ICD-10-CM | POA: Insufficient documentation

## 2016-11-02 DIAGNOSIS — E1140 Type 2 diabetes mellitus with diabetic neuropathy, unspecified: Secondary | ICD-10-CM | POA: Insufficient documentation

## 2016-11-02 DIAGNOSIS — I255 Ischemic cardiomyopathy: Secondary | ICD-10-CM | POA: Insufficient documentation

## 2016-11-04 NOTE — Progress Notes (Signed)
LAMONE, FERRELLI ([id number]) Visit Report for 11/02/2016 Arrival Information Details Patient Name: COLAN, LAYMON. Date of Service: 11/02/2016 3:45 PM Medical Record Number: [id number] Patient Account Number: [id number] Date of Birth/Sex: August 15, 1966 (50 y.o. Male) Treating RN: Ahmed Prima Primary Care Physician: SYSTEM, PCP Other Clinician: Referring Physician: Lisa Roca Treating Physician/Extender: Frann Rider in Treatment: 23 Visit Information History Since Last Visit All ordered tests and consults were completed: No Patient Arrived: Ambulatory Added or deleted any medications: No Arrival Time: 15:46 Any new allergies or adverse reactions: No Accompanied By: self Had a fall or experienced change in No Transfer Assistance: None activities of daily living that may affect Patient Identification Verified: Yes risk of falls: Secondary Verification Process Yes Signs or symptoms of abuse/neglect since last No Completed: visito Patient Requires Transmission- No Hospitalized since last visit: No Based Precautions: Pain Present Now: No Patient Has Alerts: Yes Patient Alerts: Patient on Blood Thinner Electronic Signature(s) Signed: 11/02/2016 4:21:18 PM By: Alric Quan Entered By: Alric Quan on 11/02/2016 15:46:26 Greenly, Jahred M. ([id number]) -------------------------------------------------------------------------------- Clinic Level of Care Assessment Details Patient Name: [name] Date of Service: 11/02/2016 3:45 PM Medical Record Number: [id number] Patient Account Number: [id number] Date of Birth/Sex: February 19, 1966 (50 y.o. Male) Treating RN: Ahmed Prima Primary Care Physician: SYSTEM, PCP Other Clinician: Referring Physician: Lisa Roca Treating Physician/Extender: Frann Rider in Treatment: 19 Clinic Level of Care Assessment Items TOOL 4 Quantity Score X - Use when only an EandM is performed on FOLLOW-UP  visit 1 0 ASSESSMENTS - Nursing Assessment / Reassessment X - Reassessment of Co-morbidities (includes updates in patient status) 1 10 X - Reassessment of Adherence to Treatment Plan 1 5 ASSESSMENTS - Wound and Skin Assessment / Reassessment X - Simple Wound Assessment / Reassessment - one wound 1 5 []  - Complex Wound Assessment / Reassessment - multiple wounds 0 []  - Dermatologic / Skin Assessment (not related to wound area) 0 ASSESSMENTS - Focused Assessment []  - Circumferential Edema Measurements - multi extremities 0 []  - Nutritional Assessment / Counseling / Intervention 0 []  - Lower Extremity Assessment (monofilament, tuning fork, pulses) 0 []  - Peripheral Arterial Disease Assessment (using hand held doppler) 0 ASSESSMENTS - Ostomy and/or Continence Assessment and Care []  - Incontinence Assessment and Management 0 []  - Ostomy Care Assessment and Management (repouching, etc.) 0 PROCESS - Coordination of Care X - Simple Patient / Family Education for ongoing care 1 15 []  - Complex (extensive) Patient / Family Education for ongoing care 0 X - Staff obtains Programmer, systems, Records, Test Results / Process Orders 1 10 []  - Staff telephones HHA, Nursing Homes / Clarify orders / etc 0 []  - Routine Transfer to another Facility (non-emergent condition) 0 Noe, Kirklin M. ([id number]) []  - Routine Hospital Admission (non-emergent condition) 0 []  - New Admissions / Biomedical engineer / Ordering NPWT, Apligraf, etc. 0 []  - Emergency Hospital Admission (emergent condition) 0 X - Simple Discharge Coordination 1 10 []  - Complex (extensive) Discharge Coordination 0 PROCESS - Special Needs []  - Pediatric / Minor Patient Management 0 []  - Isolation Patient Management 0 []  - Hearing / Language / Visual special needs 0 []  - Assessment of Community assistance (transportation, D/C planning, etc.) 0 []  - Additional assistance / Altered mentation 0 []  - Support Surface(s) Assessment (bed, cushion,  seat, etc.) 0 INTERVENTIONS - Wound Cleansing / Measurement X - Simple Wound Cleansing - one wound 1 5 []  - Complex Wound Cleansing - multiple wounds 0 X - Wound Imaging (photographs -  any number of wounds) 1 5 []  - Wound Tracing (instead of photographs) 0 X - Simple Wound Measurement - one wound 1 5 []  - Complex Wound Measurement - multiple wounds 0 INTERVENTIONS - Wound Dressings X - Small Wound Dressing one or multiple wounds 1 10 []  - Medium Wound Dressing one or multiple wounds 0 []  - Large Wound Dressing one or multiple wounds 0 []  - Application of Medications - topical 0 []  - Application of Medications - injection 0 INTERVENTIONS - Miscellaneous []  - External ear exam 0 Tomassi, Stephfon M. ([id number]) []  - Specimen Collection (cultures, biopsies, blood, body fluids, etc.) 0 []  - Specimen(s) / Culture(s) sent or taken to Lab for analysis 0 []  - Patient Transfer (multiple staff / Harrel Lemon Lift / Similar devices) 0 []  - Simple Staple / Suture removal (25 or less) 0 []  - Complex Staple / Suture removal (26 or more) 0 []  - Hypo / Hyperglycemic Management (close monitor of Blood Glucose) 0 []  - Ankle / Brachial Index (ABI) - do not check if billed separately 0 X - Vital Signs 1 5 Has the patient been seen at the hospital within the last three years: Yes Total Score: 85 Level Of Care: New/Established - Level 3 Electronic Signature(s) Unsigned Entered By: Alric Quan on 11/02/2016 16:23:35 Signature(s): Date(s): DENCIL, CAYSON ([id number]) -------------------------------------------------------------------------------- Encounter Discharge Information Details Patient Name: KYLE, LUPPINO. Date of Service: 11/02/2016 3:45 PM Medical Record Number: [id number] Patient Account Number: [id number] Date of Birth/Sex: [birth year] (50 y.o. Male) Treating RN: Ahmed Prima Primary Care Physician: SYSTEM, PCP Other Clinician: Referring Physician: Lisa Roca Treating  Physician/Extender: Frann Rider in Treatment: 25 Encounter Discharge Information Items Discharge Pain Level: 0 Discharge Condition: Stable Ambulatory Status: Ambulatory Discharge Destination: Home Transportation: Private Auto Accompanied By: self Schedule Follow-up Appointment: Yes Medication Reconciliation completed and provided to Patient/Care Yes Provider: Provided on Clinical Summary of Care: 11/02/2016 Form Type Recipient Paper Patient JT Electronic Signature(s) Signed: 11/02/2016 4:11:02 PM By: Ruthine Dose Entered By: Ruthine Dose on 11/02/2016 16:11:02 Byas, Lashan M. ([id number]) -------------------------------------------------------------------------------- Lower Extremity Assessment Details Patient Name: [name] Date of Service: 11/02/2016 3:45 PM Medical Record Number: [id number] Patient Account Number: [id number] Date of Birth/Sex: 1966/10/22 (50 y.o. Male) Treating RN: Ahmed Prima Primary Care Physician: SYSTEM, PCP Other Clinician: Referring Physician: Lisa Roca Treating Physician/Extender: Frann Rider in Treatment: 19 Vascular Assessment Pulses: Posterior Tibial Dorsalis Pedis Palpable: [Right:Yes] Extremity colors, hair growth, and conditions: Extremity Color: [Right:Mottled] Temperature of Extremity: [Right:Warm] Capillary Refill: [Right:< 3 seconds] Toe Nail Assessment Left: Right: Thick: Yes Discolored: Yes Deformed: Yes Improper Length and Hygiene: Yes Electronic Signature(s) Signed: 11/02/2016 4:21:18 PM By: Alric Quan Entered By: Alric Quan on 11/02/2016 15:56:44 Swalley, Woodley M. ([id number]) -------------------------------------------------------------------------------- Multi Wound Chart Details Patient Name: [name] Date of Service: 11/02/2016 3:45 PM Medical Record Number: [id number] Patient Account Number: [id number] Date of Birth/Sex: 01/21/1966 (50 y.o.  Male) Treating RN: Ahmed Prima Primary Care Physician: SYSTEM, PCP Other Clinician: Referring Physician: Lisa Roca Treating Physician/Extender: Frann Rider in Treatment: 19 Vital Signs Height(in): 64 Pulse(bpm): 92 Weight(lbs): 274 Blood Pressure 154/80 (mmHg): Body Mass Index(BMI): 47 Temperature(F): 97.8 Respiratory Rate 18 (breaths/min): Photos: [1:No Photos] [N/A:N/A] Wound Location: [1:Right Lower Leg - Lateral] [N/A:N/A] Wounding Event: [1:Gradually Appeared] [N/A:N/A] Primary Etiology: [1:Diabetic Wound/Ulcer of the Lower Extremity] [N/A:N/A] Comorbid History: [1:Anemia, Sleep Apnea, Arrhythmia, Congestive Heart Failure, Hypertension, Peripheral Venous Disease, Type II Diabetes, Gout, Neuropathy] [N/A:N/A] Date Acquired: [1:04/23/2016] [N/A:N/A] Weeks of Treatment: [1:19] [  N/A:N/A] Wound Status: [1:Open] [N/A:N/A] Measurements L x W x D 0.9x0.6x0.2 [N/A:N/A] (cm) Area (cm) : [1:0.424] [N/A:N/A] Volume (cm) : [1:0.085] [N/A:N/A] % Reduction in Area: [1:76.00%] [N/A:N/A] % Reduction in Volume: 90.40% [N/A:N/A] Classification: [1:Grade 1] [N/A:N/A] Exudate Amount: [1:Large] [N/A:N/A] Exudate Type: [1:Serosanguineous] [N/A:N/A] Exudate Color: [1:red, brown] [N/A:N/A] Wound Margin: [1:Thickened] [N/A:N/A] Granulation Amount: [1:Medium (34-66%)] [N/A:N/A] Granulation Quality: [1:Pink, Hyper-granulation] [N/A:N/A] Necrotic Amount: [1:Medium (34-66%)] [N/A:N/A] Exposed Structures: [N/A:N/A] Fascia: No Fat: No Tendon: No Muscle: No Joint: No Bone: No Limited to Skin Breakdown Epithelialization: Medium (34-66%) N/A N/A Periwound Skin Texture: Rash: Yes N/A N/A Scarring: Yes Edema: No Excoriation: No Induration: No Callus: No Crepitus: No Fluctuance: No Friable: No Periwound Skin Moist: Yes N/A N/A Moisture: Maceration: No Dry/Scaly: No Periwound Skin Color: Hemosiderin Staining: Yes N/A N/A Atrophie Blanche: No Cyanosis:  No Ecchymosis: No Erythema: No Mottled: No Pallor: No Rubor: No Temperature: No Abnormality N/A N/A Tenderness on Yes N/A N/A Palpation: Wound Preparation: Ulcer Cleansing: N/A N/A Rinsed/Irrigated with Saline Topical Anesthetic Applied: Other: lidocaine 4% Treatment Notes Electronic Signature(s) Signed: 11/02/2016 4:21:18 PM By: Alric Quan Entered By: Alric Quan on 11/02/2016 15:54:48 Brumley, Juleon M. ([id number]) -------------------------------------------------------------------------------- Multi-Disciplinary Care Plan Details Patient Name: [name] Date of Service: 11/02/2016 3:45 PM Medical Record Number: [id number] Patient Account Number: [id number] Date of Birth/Sex: 04/19/1966 (50 y.o. Male) Treating RN: Ahmed Prima Primary Care Physician: SYSTEM, PCP Other Clinician: Referring Physician: Lisa Roca Treating Physician/Extender: Frann Rider in Treatment: 54 Active Inactive Orientation to the Wound Care Program Nursing Diagnoses: Knowledge deficit related to the wound healing center program Goals: Patient/caregiver will verbalize understanding of the [facility] Date Initiated: 06/18/2016 Goal Status: Active Interventions: Provide education on orientation to the wound center Notes: Venous Leg Ulcer Nursing Diagnoses: Knowledge deficit related to disease process and management Potential for venous Insuffiency (use before diagnosis confirmed) Goals: Patient will maintain optimal edema control Date Initiated: 06/18/2016 Goal Status: Active Patient/caregiver will verbalize understanding of disease process and disease management Date Initiated: 06/18/2016 Goal Status: Active Verify adequate tissue perfusion prior to therapeutic compression application Date Initiated: 06/18/2016 Goal Status: Active Interventions: Compression as ordered Provide education on venous insufficiency Notes: SMOKEY, MELOTT  ([id number]) Wound/Skin Impairment Nursing Diagnoses: Impaired tissue integrity Goals: Patient/caregiver will verbalize understanding of skin care regimen Date Initiated: 06/18/2016 Goal Status: Active Ulcer/skin breakdown will have a volume reduction of 30% by week 4 Date Initiated: 06/18/2016 Goal Status: Active Ulcer/skin breakdown will have a volume reduction of 50% by week 8 Date Initiated: 06/18/2016 Goal Status: Active Ulcer/skin breakdown will have a volume reduction of 80% by week 12 Date Initiated: 06/18/2016 Goal Status: Active Ulcer/skin breakdown will heal within 14 weeks Date Initiated: 06/18/2016 Goal Status: Active Interventions: Assess patient/caregiver ability to obtain necessary supplies Assess patient/caregiver ability to perform ulcer/skin care regimen upon admission and as needed Assess ulceration(s) every visit Provide education on ulcer and skin care Treatment Activities: Skin care regimen initiated : 06/18/2016 Topical wound management initiated : 06/18/2016 Notes: Electronic Signature(s) Signed: 11/02/2016 4:21:18 PM By: Alric Quan Entered By: Alric Quan on 11/02/2016 15:54:23 Sparlin, Deven M. ([id number]) -------------------------------------------------------------------------------- Pain Assessment Details Patient Name: [name] Date of Service: 11/02/2016 3:45 PM Medical Record Number: [id number] Patient Account Number: [id number] Date of Birth/Sex: May 17, 1966 (50 y.o. Male) Treating RN: Ahmed Prima Primary Care Physician: SYSTEM, PCP Other Clinician: Referring Physician: Lisa Roca Treating Physician/Extender: Frann Rider in Treatment: 19 Active Problems Location of Pain Severity and Description of  Pain Patient Has Paino No Site Locations With Dressing Change: No Pain Management and Medication Current Pain Management: Electronic Signature(s) Signed: 11/02/2016 4:21:18 PM By: Alric Quan Entered By:  Alric Quan on 11/02/2016 15:46:31 Potts, Lochlann M. ([id number]) -------------------------------------------------------------------------------- Patient/Caregiver Education Details Patient Name: [name] Date of Service: 11/02/2016 3:45 PM Medical Record Number: [id number] Patient Account Number: [id number] Date of Birth/Gender: September 25, 1966 (50 y.o. Male) Treating RN: Ahmed Prima Primary Care Physician: SYSTEM, PCP Other Clinician: Referring Physician: Lisa Roca Treating Physician/Extender: Frann Rider in Treatment: 82 Education Assessment Education Provided To: Patient Education Topics Provided Wound/Skin Impairment: Handouts: Other: change dressing as ordered Methods: Demonstration, Explain/Verbal Responses: State content correctly Electronic Signature(s) Signed: 11/02/2016 4:21:18 PM By: Alric Quan Entered By: Alric Quan on 11/02/2016 15:57:20 Kessinger, Matis M. ([id number]) -------------------------------------------------------------------------------- Wound Assessment Details Patient Name: [name] Date of Service: 11/02/2016 3:45 PM Medical Record Number: [id number] Patient Account Number: [id number] Date of Birth/Sex: March 11, 1966 (50 y.o. Male) Treating RN: Ahmed Prima Primary Care Physician: SYSTEM, PCP Other Clinician: Referring Physician: Lisa Roca Treating Physician/Extender: Frann Rider in Treatment: 19 Wound Status Wound Number: 1 Primary Diabetic Wound/Ulcer of the Lower Etiology: Extremity Wound Location: Right Lower Leg - Lateral Wound Open Wounding Event: Gradually Appeared Status: Date Acquired: 04/23/2016 Comorbid Anemia, Sleep Apnea, Arrhythmia, Weeks Of Treatment: 19 History: Congestive Heart Failure, Hypertension, Clustered Wound: No Peripheral Venous Disease, Type II Diabetes, Gout, Neuropathy Photos Photo Uploaded By: Alric Quan on 11/02/2016 15:56:12 Wound  Measurements Length: (cm) 0.9 Width: (cm) 0.6 Depth: (cm) 0.2 Area: (cm) 0.424 Volume: (cm) 0.085 % Reduction in Area: 76% % Reduction in Volume: 90.4% Epithelialization: Medium (34-66%) Tunneling: No Undermining: No Wound Description Classification: Grade 1 Wound Margin: Thickened Exudate Amount: Large Exudate Type: Serosanguineous Exudate Color: red, brown Foul Odor After Cleansing: No Wound Bed Granulation Amount: Medium (34-66%) Exposed Structure Granulation Quality: Pink, Hyper-granulation Fascia Exposed: No Necrotic Amount: Medium (34-66%) Fat Layer Exposed: No Nadel, Javanni M. ([id number]) Necrotic Quality: Adherent Slough Tendon Exposed: No Muscle Exposed: No Joint Exposed: No Bone Exposed: No Limited to Skin Breakdown Periwound Skin Texture Texture Color No Abnormalities Noted: No No Abnormalities Noted: No Callus: No Atrophie Blanche: No Crepitus: No Cyanosis: No Excoriation: No Ecchymosis: No Fluctuance: No Erythema: No Friable: No Hemosiderin Staining: Yes Induration: No Mottled: No Localized Edema: No Pallor: No Rash: Yes Rubor: No Scarring: Yes Temperature / Pain Moisture Temperature: No Abnormality No Abnormalities Noted: No Tenderness on Palpation: Yes Dry / Scaly: No Maceration: No Moist: Yes Wound Preparation Ulcer Cleansing: Rinsed/Irrigated with Saline Topical Anesthetic Applied: Other: lidocaine 4%, Treatment Notes Wound #1 (Right, Lateral Lower Leg) 1. Cleansed with: Clean wound with Normal Saline 2. Anesthetic Topical Lidocaine 4% cream to wound bed prior to debridement 4. Dressing Applied: Prisma Ag Notes coverlet bordered dressing Electronic Signature(s) Signed: 11/02/2016 4:21:18 PM By: Alric Quan Entered By: Alric Quan on 11/02/2016 15:53:06 Whitwell, Yoshito M. ([id number]) -------------------------------------------------------------------------------- [facility] Details Patient Name: [name] Date of Service: 11/02/2016 3:45 PM Medical Record Number: [id number] Patient Account Number: [id number] Date of Birth/Sex: [birth year] (50 y.o. Male) Treating RN: Ahmed Prima Primary Care Physician: SYSTEM, PCP Other Clinician: Referring Physician: Lisa Roca Treating Physician/Extender: Frann Rider in Treatment: 19 Vital Signs Time Taken: 15:46 Temperature (F): 97.8 Height (in): 64 Pulse (bpm): 92 Weight (lbs): 274 Respiratory Rate (breaths/min): 18 Body Mass Index (BMI): 47 Blood Pressure (mmHg): 154/80 Reference Range: 80 - 120 mg / dl Electronic Signature(s) Signed: 11/02/2016 4:21:18 PM By: Carolyne Fiscal,  Debra Entered By: Alric Quan on 11/02/2016 15:52:47

## 2016-11-04 NOTE — Progress Notes (Signed)
BARI, HANDSHOE ([id number]) Visit Report for 11/02/2016 Chief Complaint Document Details [name] 11/02/2016 3:45 Patient Name: Date of Service: M. PM Medical Record Patient Account Number: [id number] Number: Treating RN: Ahmed Prima Date of Birth/Sex: 1966-10-25 (50 y.o. Male) Other Clinician: Primary Care Physician: SYSTEM, PCP Treating Christin Fudge Referring Physician: Lisa Roca Physician/Extender: Weeks in Treatment: 19 Information Obtained from: Patient Chief Complaint Patients presents for treatment of an open diabetic ulcer to the right lower extremity near his car for about 3 months Electronic Signature(s) Signed: 11/02/2016 4:05:35 PM By: Christin Fudge MD, FACS Entered By: Christin Fudge on 11/02/2016 16:05:35 Mottley, [name] ([id number]) -------------------------------------------------------------------------------- HPI Details Brown, [name] 11/02/2016 3:45 Patient Name: Date of Service: M. PM Medical Record Patient Account Number: [id number] Number: Treating RN: Ahmed Prima Date of Birth/Sex: [birth year] (50 y.o. Male) Other Clinician: Primary Care Physician: SYSTEM, PCP Treating Christin Fudge Referring Physician: Lisa Roca Physician/Extender: Weeks in Treatment: 19 History of Present Illness Location: right lower extremity on the lateral calf area Quality: Patient reports experiencing a sharp pain to affected area(s). Severity: Presents with a pain of ____10_on a pain scale of 1-10. Duration: Patient has had the wound for > 3 months prior to seeking treatment at the wound center Timing: Pain in wound is constant (hurts all the time) Context: The wound occurred when the patient may have scratched vigorously in his sleep because of his psoriasis Modifying Factors: Other treatment(s) tried include:takes treatment for psoriasis with local creams and UVB light Associated Signs and Symptoms: Patient  reports presence of swelling HPI Description: 50 year old gentleman was seen recently in the ER last week with a chief complaints of cellulitis and skin ulcer. He has a history of CHF, Psoriasis, hypertension, diabetes mellitus and peripheral neuropathy. 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 The ulcer was on his right lateral calf and has been there for several months and become larger with time. last hemoglobin A1c was 8.3% in May. He has never been a smoker. he had an ultrasound of the right lower extremity for a DVT study and this was normal. Chest x-ray showed no active disease. He was put on clindamycin 300 mg 3 times a day for 10 days. 08/09/2016 -- not been here for 3 weeks and has been doing well and doing his dressings regularly. 10/04/2016 -- he has not been back for about a month but says he was ill with what sounds like bronchitis or CHF exacerbation. He was treated with antibiotics and fluid pills as per his verbal report. 11/02/2016 -- he has not been seen back here for about a month due to social economic reasons Electronic Signature(s) Signed: 11/02/2016 4:06:01 PM By: Christin Fudge MD, FACS Entered By: Christin Fudge on 11/02/2016 Brown, [name] ([id number]) -------------------------------------------------------------------------------- Physical Exam Details Lynds, Shloma 11/02/2016 3:45 Patient Name: Date of Service: M. PM Medical Record Patient Account Number: [id number] Number: Treating RN: Ahmed Prima Date of Birth/Sex: 1966-05-25 (50 y.o. Male) Other Clinician: Primary Care Physician: SYSTEM, PCP Treating Christin Fudge Referring Physician: Lisa Roca Physician/Extender: Weeks in Treatment: 19 Constitutional . Pulse regular. Respirations normal and unlabored. Afebrile.  . Eyes Nonicteric. Reactive to light. Ears, Nose, Mouth, and Throat Lips, teeth, and gums WNL.[name] Kitchen Moist mucosa without lesions. Neck supple and nontender. No palpable supraclavicular or cervical adenopathy. Normal sized without goiter. Respiratory WNL. No retractions.. Breath sounds WNL, No rubs, rales, rhonchi,  or wheeze.. Cardiovascular Heart rhythm and rate regular, no murmur or gallop.. Pedal Pulses WNL. No clubbing, cyanosis or edema. Lymphatic No adneopathy. No adenopathy. No adenopathy. Musculoskeletal Adexa without tenderness or enlargement.. Digits and nails w/o clubbing, cyanosis, infection, petechiae, ischemia, or inflammatory conditions.. Integumentary (Hair, Skin) No suspicious lesions. No crepitus or fluctuance. No peri-wound warmth or erythema. No masses.[name] Kitchen Psychiatric Judgement and insight Intact.. No evidence of depression, anxiety, or agitation.. Notes the wound is looking clean and did not need any sharp debridement today and I was able to wash out some superficial debris with moist saline gauze. Electronic Signature(s) Signed: 11/02/2016 4:06:23 PM By: Christin Fudge MD, FACS Entered By: Christin Fudge on 11/02/2016 16:06:23 Dillavou, [name] ([id number]) -------------------------------------------------------------------------------- Physician Orders Details Brown, [name] 11/02/2016 3:45 Patient Name: Date of Service: M. PM Medical Record Patient Account Number: [id number] Number: Treating RN: Ahmed Prima Date of Birth/Sex: 01/09/1966 (50 y.o. Male) Other Clinician: Primary Care Physician: SYSTEM, PCP Treating Britto, Errol Referring Physician: Lisa Roca Physician/Extender: Weeks in Treatment: 16 Verbal / Phone Orders: No Diagnosis Coding Wound Cleansing Wound #1 Right,Lateral Lower Leg o Cleanse wound with mild soap and water Anesthetic Wound #1 Right,Lateral Lower Leg o Topical Lidocaine 4% cream applied to wound bed prior  to debridement Primary Wound Dressing Wound #1 Right,Lateral Lower Leg o Prisma Ag - moisten with saline Secondary Dressing Wound #1 Right,Lateral Lower Leg o Non-adherent pad - Coverlet Bandaid Dressing Change Frequency Wound #1 Right,Lateral Lower Leg o Change dressing every other day. Follow-up Appointments Wound #1 Right,Lateral Lower Leg o Return Appointment in 1 week. Additional Orders / Instructions Wound #1 Right,Lateral Lower Leg o Increase protein intake. o Activity as tolerated Electronic Signature(s) Signed: 11/02/2016 4:17:50 PM By: Christin Fudge MD, FACS Signed: 11/02/2016 4:21:18 PM By: Douglass Rivers, Quin M. ([id number]) Entered By: Alric Quan on 11/02/2016 16:02:24 Dicarlo, D'Arcy M. ([id number]) -------------------------------------------------------------------------------- Problem List Details Vo, Vilas 11/02/2016 3:45 Patient Name: Date of Service: M. PM Medical Record Patient Account Number: [id number] Number: Treating RN: Ahmed Prima Date of Birth/Sex: [birth year] (50 y.o. 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 ([id number]) -------------------------------------------------------------------------------- Progress Note Details Carelli, [place] 11/02/2016  3:45 Patient Name: Date of Service: M. PM Medical Record Patient Account Number: [id number] Number: Treating RN: Ahmed Prima Date of Birth/Sex: November 07, 1966 (50 y.o. Male) Other Clinician: Primary Care Physician: SYSTEM, PCP Treating Britto, Errol Referring Physician: Lisa Roca Physician/Extender: Weeks in Treatment: 19 Subjective Chief Complaint Information obtained from Patient Patients presents for treatment of an open diabetic ulcer to the right lower extremity near his car for about 3 months History of Present Illness (HPI) The following HPI elements were documented for the patient's wound: Location: right lower extremity on the lateral calf area Quality: Patient reports experiencing a sharp pain to affected area(s). Severity: Presents with a pain of ____10_on a pain scale of 1-10. Duration: Patient has had the wound for > 3 months prior to seeking treatment at the wound center Timing: Pain in wound  is constant (hurts all the time) Context: The wound occurred when the patient may have scratched vigorously in his sleep because of his psoriasis Modifying Factors: Other treatment(s) tried include:takes treatment for psoriasis with local creams and UVB light Associated Signs and Symptoms: Patient reports presence of swelling 50 year old gentleman was seen recently in the ER last week with a chief complaints of cellulitis and skin ulcer. He has a history of CHF, Psoriasis, hypertension, diabetes mellitus and peripheral neuropathy. 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 The ulcer was on his right lateral calf and has been there for several months and become larger with time. last hemoglobin A1c was 8.3% in May. He has never been a smoker. he had an ultrasound of the right lower extremity for a DVT study and this was normal.  Chest x-ray showed no active disease. He was put on clindamycin 300 mg 3 times a day for 10 days. 08/09/2016 -- not been here for 3 weeks and has been doing well and doing his dressings regularly. 10/04/2016 -- he has not been back for about a month but says he was ill with what sounds like bronchitis or CHF exacerbation. He was treated with antibiotics and fluid pills as per his verbal report. 11/02/2016 -- he has not been seen back here for about a month due to social economic reasons SCARLATA, Boomer M. ([id number]) Objective Constitutional Pulse regular. Respirations normal and unlabored. Afebrile. Vitals Time Taken: 3:46 PM, Height: 64 in, Weight: 274 lbs, BMI: 47, Temperature: 97.8 F, Pulse: 92 bpm, Respiratory Rate: 18 breaths/min, Blood Pressure: 154/80 mmHg. Eyes Nonicteric. Reactive to light. Ears, Nose, Mouth, and Throat Lips, teeth, and gums WNL.[name] Kitchen Moist mucosa without lesions. Neck supple and nontender. No palpable supraclavicular or cervical adenopathy. Normal sized without goiter. Respiratory WNL. No retractions.. Breath sounds WNL, No rubs, rales, rhonchi, or wheeze.. Cardiovascular Heart rhythm and rate regular, no murmur or gallop.. Pedal Pulses WNL. No clubbing, cyanosis or edema. Lymphatic No adneopathy. No adenopathy. No adenopathy. Musculoskeletal Adexa without tenderness or enlargement.. Digits and nails w/o clubbing, cyanosis, infection, petechiae, ischemia, or inflammatory conditions.[name] Kitchen Psychiatric Judgement and insight Intact.. No evidence of depression, anxiety, or agitation.. General Notes: the wound is looking clean and did not need any sharp debridement today and I was able to wash out some superficial debris with moist saline gauze. Integumentary (Hair, Skin) No suspicious lesions. No crepitus or fluctuance. No peri-wound warmth or erythema. No masses.. Wound #1 status is Open. Original cause of wound was Gradually Appeared. The wound is located on  the Right,Lateral Lower Leg. The wound measures 0.9cm length x 0.6cm width x 0.2cm depth; 0.424cm^2 area and 0.085cm^3 volume. The wound is limited to skin breakdown. There is no tunneling or undermining noted. There is a large amount of serosanguineous drainage noted. The wound margin is thickened. There is medium (34-66%) pink granulation within the wound bed. There is a medium (34-66%) amount of necrotic tissue within the wound bed including Adherent Slough. The periwound skin appearance exhibited: Rash, Scarring, Moist, Hemosiderin Staining. The periwound skin appearance did not exhibit: Callus, Crepitus, Excoriation, Fluctuance, Friable, Induration, Localized Edema, Dry/Scaly, Maceration, Atrophie Hiram, Mciver, Yifan M. ([id number]) Cyanosis, Ecchymosis, Mottled, Pallor, Rubor, Erythema. Periwound temperature was noted as No Abnormality. The periwound has tenderness on palpation. Assessment Active Problems ICD-10 E11.622 - Type 2 diabetes mellitus with other skin ulcer L97.212 - Non-pressure chronic ulcer of  right calf with fat layer exposed L40.9 - Psoriasis, unspecified E66.01 - Morbid (severe) obesity due to excess calories Plan Wound Cleansing: Wound #1 Right,Lateral Lower Leg: Cleanse wound with mild soap and water Anesthetic: Wound #1 Right,Lateral Lower Leg: Topical Lidocaine 4% cream applied to wound bed prior to debridement Primary Wound Dressing: Wound #1 Right,Lateral Lower Leg: Prisma Ag - moisten with saline Secondary Dressing: Wound #1 Right,Lateral Lower Leg: Non-adherent pad - Coverlet Bandaid Dressing Change Frequency: Wound #1 Right,Lateral Lower Leg: Change dressing every other day. Follow-up Appointments: Wound #1 Right,Lateral Lower Leg: Return Appointment in 1 week. Additional Orders / Instructions: Wound #1 Right,Lateral Lower Leg: Increase protein intake. Activity as tolerated Domino, Zaid M. ([id number]) He will use Silver Collagen  over his wound on alternate days and will continue to work on his diuresis and watch his salt intake. I have asked him to continue with elevation and exercise and because of his psoriasis it's very difficult to have him in compression stockings. Electronic Signature(s) Signed: 11/02/2016 4:08:13 PM By: Christin Fudge MD, FACS Entered By: Christin Fudge on 11/02/2016 16:08:13 Secord, Rahil Jerilynn Mages ([id number]) -------------------------------------------------------------------------------- SuperBill Details Patient Name: [name] Date of Service: 11/02/2016 Medical Record Number: [id number] Patient Account Number: [id number] Date of Birth/Sex: [birth year] (50 y.o. 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 L40.9 Psoriasis, unspecified E66.01 Morbid (severe) obesity due to excess calories Facility Procedures CPT4 Code: [id number] Description: 99213 - WOUND CARE VISIT-LEV 3 EST PT Modifier: Quantity: 1 Physician Procedures CPT4 Code: [id number] Description: 33832 - WC PHYS LEVEL 3 - EST PT ICD-10 Description Diagnosis E11.622 Type 2 diabetes mellitus with other skin ulcer L97.212 Non-pressure chronic ulcer of right calf with fat L40.9 Psoriasis, unspecified E66.01 Morbid (severe) obesity due to  excess calories Modifier: layer exposed Quantity: 1 Electronic Signature(s) Signed: 11/02/2016 4:24:06 PM By: Christin Fudge MD, FACS Previous Signature: 11/02/2016 4:08:37 PM Version By: Christin Fudge MD, FACS Entered By: Alric Quan on 11/02/2016 16:23:53

## 2016-11-16 ENCOUNTER — Ambulatory Visit: Payer: BLUE CROSS/BLUE SHIELD | Admitting: Nurse Practitioner

## 2016-11-20 ENCOUNTER — Emergency Department: Payer: Medicaid Other

## 2016-11-20 ENCOUNTER — Encounter: Payer: Self-pay | Admitting: *Deleted

## 2016-11-20 ENCOUNTER — Inpatient Hospital Stay
Admission: EM | Admit: 2016-11-20 | Discharge: 2016-11-29 | DRG: 189 | Disposition: A | Discharge status: Home Health | Payer: Medicaid Other | Attending: Internal Medicine | Admitting: Internal Medicine

## 2016-11-20 DIAGNOSIS — R7989 Other specified abnormal findings of blood chemistry: Secondary | ICD-10-CM

## 2016-11-20 DIAGNOSIS — G510 Bell's palsy: Secondary | ICD-10-CM | POA: Diagnosis present

## 2016-11-20 DIAGNOSIS — Z79899 Other long term (current) drug therapy: Secondary | ICD-10-CM

## 2016-11-20 DIAGNOSIS — I509 Heart failure, unspecified: Secondary | ICD-10-CM

## 2016-11-20 DIAGNOSIS — I130 Hypertensive heart and chronic kidney disease with heart failure and stage 1 through stage 4 chronic kidney disease, or unspecified chronic kidney disease: Secondary | ICD-10-CM | POA: Diagnosis present

## 2016-11-20 DIAGNOSIS — R0602 Shortness of breath: Secondary | ICD-10-CM

## 2016-11-20 DIAGNOSIS — J209 Acute bronchitis, unspecified: Secondary | ICD-10-CM | POA: Diagnosis present

## 2016-11-20 DIAGNOSIS — M109 Gout, unspecified: Secondary | ICD-10-CM | POA: Diagnosis present

## 2016-11-20 DIAGNOSIS — J9601 Acute respiratory failure with hypoxia: Secondary | ICD-10-CM

## 2016-11-20 DIAGNOSIS — J189 Pneumonia, unspecified organism: Secondary | ICD-10-CM

## 2016-11-20 DIAGNOSIS — Z6841 Body Mass Index (BMI) 40.0 and over, adult: Secondary | ICD-10-CM | POA: Diagnosis not present

## 2016-11-20 DIAGNOSIS — X58XXXA Exposure to other specified factors, initial encounter: Secondary | ICD-10-CM | POA: Diagnosis present

## 2016-11-20 DIAGNOSIS — S80921A Unspecified superficial injury of right lower leg, initial encounter: Secondary | ICD-10-CM | POA: Diagnosis present

## 2016-11-20 DIAGNOSIS — E7800 Pure hypercholesterolemia, unspecified: Secondary | ICD-10-CM | POA: Diagnosis present

## 2016-11-20 DIAGNOSIS — L409 Psoriasis, unspecified: Secondary | ICD-10-CM | POA: Diagnosis present

## 2016-11-20 DIAGNOSIS — R51 Headache: Secondary | ICD-10-CM | POA: Diagnosis not present

## 2016-11-20 DIAGNOSIS — G4730 Sleep apnea, unspecified: Secondary | ICD-10-CM | POA: Diagnosis present

## 2016-11-20 DIAGNOSIS — I5023 Acute on chronic systolic (congestive) heart failure: Secondary | ICD-10-CM

## 2016-11-20 DIAGNOSIS — E11622 Type 2 diabetes mellitus with other skin ulcer: Secondary | ICD-10-CM | POA: Diagnosis present

## 2016-11-20 DIAGNOSIS — R609 Edema, unspecified: Secondary | ICD-10-CM

## 2016-11-20 DIAGNOSIS — I255 Ischemic cardiomyopathy: Secondary | ICD-10-CM | POA: Diagnosis present

## 2016-11-20 DIAGNOSIS — J90 Pleural effusion, not elsewhere classified: Secondary | ICD-10-CM

## 2016-11-20 DIAGNOSIS — Z794 Long term (current) use of insulin: Secondary | ICD-10-CM | POA: Diagnosis not present

## 2016-11-20 DIAGNOSIS — R778 Other specified abnormalities of plasma proteins: Secondary | ICD-10-CM

## 2016-11-20 DIAGNOSIS — N182 Chronic kidney disease, stage 2 (mild): Secondary | ICD-10-CM | POA: Diagnosis present

## 2016-11-20 DIAGNOSIS — Z9114 Patient's other noncompliance with medication regimen: Secondary | ICD-10-CM

## 2016-11-20 DIAGNOSIS — R0902 Hypoxemia: Secondary | ICD-10-CM

## 2016-11-20 DIAGNOSIS — I5043 Acute on chronic combined systolic (congestive) and diastolic (congestive) heart failure: Secondary | ICD-10-CM | POA: Diagnosis present

## 2016-11-20 DIAGNOSIS — E1140 Type 2 diabetes mellitus with diabetic neuropathy, unspecified: Secondary | ICD-10-CM | POA: Diagnosis present

## 2016-11-20 DIAGNOSIS — E1122 Type 2 diabetes mellitus with diabetic chronic kidney disease: Secondary | ICD-10-CM | POA: Diagnosis present

## 2016-11-20 DIAGNOSIS — Z7982 Long term (current) use of aspirin: Secondary | ICD-10-CM

## 2016-11-20 LAB — BASIC METABOLIC PANEL
Anion gap: 6 (ref 5–15)
BUN: 12 mg/dL (ref 6–20)
CO2: 35 mmol/L — ABNORMAL HIGH (ref 22–32)
Calcium: 8.8 mg/dL — ABNORMAL LOW (ref 8.9–10.3)
Chloride: 101 mmol/L (ref 101–111)
Creatinine, Ser: 1.21 mg/dL (ref 0.61–1.24)
GFR calc Af Amer: >60 mL/min (ref >60)
GFR calc non Af Amer: >60 mL/min (ref >60)
Glucose, Bld: 210 mg/dL — ABNORMAL HIGH (ref 65–99)
Potassium: 3.6 mmol/L (ref 3.5–5.1)
Sodium: 142 mmol/L (ref 135–145)

## 2016-11-20 LAB — CBC
HCT: 39.9 % — ABNORMAL LOW (ref 40.0–52.0)
Hemoglobin: 12.7 g/dL — ABNORMAL LOW (ref 13.0–18.0)
MCH: 25 pg — ABNORMAL LOW (ref 26.0–34.0)
MCHC: 31.9 g/dL — ABNORMAL LOW (ref 32.0–36.0)
MCV: 78.4 fL — ABNORMAL LOW (ref 80.0–100.0)
Platelets: 171 10*3/uL (ref 150–440)
RBC: 5.08 MIL/uL (ref 4.40–5.90)
RDW: 17.3 % — ABNORMAL HIGH (ref 11.5–14.5)
WBC: 12.4 10*3/uL — ABNORMAL HIGH (ref 3.8–10.6)

## 2016-11-20 LAB — BLOOD GAS, VENOUS
Acid-Base Excess: 11.1 mmol/L — ABNORMAL HIGH (ref 0.0–2.0)
Bicarbonate: 37.4 mmol/L — ABNORMAL HIGH (ref 20.0–28.0)
O2 Saturation: 81.6 %
Patient temperature: 37
pCO2, Ven: 55 mmHg (ref 44.0–60.0)
pH, Ven: 7.44 — ABNORMAL HIGH (ref 7.250–7.430)
pO2, Ven: 44 mmHg (ref 32.0–45.0)

## 2016-11-20 LAB — GLUCOSE, CAPILLARY: Glucose-Capillary: 300 mg/dL — ABNORMAL HIGH (ref 65–99)

## 2016-11-20 LAB — BRAIN NATRIURETIC PEPTIDE: B Natriuretic Peptide: 111 pg/mL — ABNORMAL HIGH (ref 0.0–100.0)

## 2016-11-20 LAB — TROPONIN I
Troponin I: 0.07 ng/mL (ref <0.03)
Troponin I: 0.06 ng/mL (ref <0.03)

## 2016-11-20 MED ORDER — CEFTRIAXONE SODIUM-DEXTROSE 1-3.74 GM-% IV SOLR
1.0000 g | Freq: Once | INTRAVENOUS | Status: AC
  Administered 2016-11-20: 1 g via INTRAVENOUS
  Filled 2016-11-20: qty 50

## 2016-11-20 MED ORDER — IPRATROPIUM-ALBUTEROL 0.5-2.5 (3) MG/3ML IN SOLN
3.0000 mL | RESPIRATORY_TRACT | Status: DC
  Administered 2016-11-21 – 2016-11-22 (×8): 3 mL via RESPIRATORY_TRACT
  Filled 2016-11-20 (×9): qty 3

## 2016-11-20 MED ORDER — GABAPENTIN 300 MG PO CAPS
300.0000 mg | ORAL_CAPSULE | Freq: Three times a day (TID) | ORAL | Status: DC
  Administered 2016-11-20 – 2016-11-29 (×26): 300 mg via ORAL
  Filled 2016-11-20 (×26): qty 1

## 2016-11-20 MED ORDER — CEFTRIAXONE SODIUM-DEXTROSE 1-3.74 GM-% IV SOLR
1.0000 g | INTRAVENOUS | Status: DC
  Filled 2016-11-20: qty 50

## 2016-11-20 MED ORDER — SODIUM CHLORIDE 0.9% FLUSH
3.0000 mL | Freq: Two times a day (BID) | INTRAVENOUS | Status: DC
  Administered 2016-11-20 – 2016-11-28 (×17): 3 mL via INTRAVENOUS

## 2016-11-20 MED ORDER — HYDRALAZINE HCL 25 MG PO TABS
25.0000 mg | ORAL_TABLET | Freq: Three times a day (TID) | ORAL | Status: DC
  Administered 2016-11-20 – 2016-11-21 (×2): 25 mg via ORAL
  Filled 2016-11-20 (×2): qty 1

## 2016-11-20 MED ORDER — NITROGLYCERIN 2 % TD OINT
1.0000 [in_us] | TOPICAL_OINTMENT | Freq: Once | TRANSDERMAL | Status: AC
  Administered 2016-11-20: 1 [in_us] via TOPICAL
  Filled 2016-11-20: qty 1

## 2016-11-20 MED ORDER — ALLOPURINOL 100 MG PO TABS
200.0000 mg | ORAL_TABLET | Freq: Every day | ORAL | Status: DC
  Administered 2016-11-20 – 2016-11-29 (×10): 200 mg via ORAL
  Filled 2016-11-20 (×10): qty 2

## 2016-11-20 MED ORDER — DEXTROSE 5 % IV SOLN
500.0000 mg | Freq: Once | INTRAVENOUS | Status: AC
  Administered 2016-11-20: 500 mg via INTRAVENOUS
  Filled 2016-11-20: qty 500

## 2016-11-20 MED ORDER — ASPIRIN EC 81 MG PO TBEC
81.0000 mg | DELAYED_RELEASE_TABLET | Freq: Every day | ORAL | Status: DC
  Administered 2016-11-21 – 2016-11-29 (×8): 81 mg via ORAL
  Filled 2016-11-20 (×8): qty 1

## 2016-11-20 MED ORDER — DEXTROSE 5 % IV SOLN
250.0000 mg | INTRAVENOUS | Status: DC
  Filled 2016-11-20 (×2): qty 250

## 2016-11-20 MED ORDER — DEXTROSE 5 % IV SOLN
1.0000 g | INTRAVENOUS | Status: DC
  Administered 2016-11-20: 1 g via INTRAVENOUS

## 2016-11-20 MED ORDER — PANTOPRAZOLE SODIUM 40 MG PO TBEC
40.0000 mg | DELAYED_RELEASE_TABLET | Freq: Every day | ORAL | Status: DC
  Administered 2016-11-20 – 2016-11-29 (×10): 40 mg via ORAL
  Filled 2016-11-20 (×10): qty 1

## 2016-11-20 MED ORDER — FUROSEMIDE 10 MG/ML IJ SOLN
20.0000 mg | Freq: Three times a day (TID) | INTRAMUSCULAR | Status: DC
  Administered 2016-11-20 – 2016-11-21 (×3): 20 mg via INTRAVENOUS
  Filled 2016-11-20 (×3): qty 2

## 2016-11-20 MED ORDER — ATORVASTATIN CALCIUM 20 MG PO TABS
40.0000 mg | ORAL_TABLET | Freq: Every day | ORAL | Status: DC
  Administered 2016-11-20 – 2016-11-28 (×9): 40 mg via ORAL
  Filled 2016-11-20 (×9): qty 2

## 2016-11-20 MED ORDER — METHOCARBAMOL 500 MG PO TABS: 500.0000 mg | ORAL_TABLET | Freq: Three times a day (TID) | ORAL | Status: DC | PRN

## 2016-11-20 MED ORDER — ALBUTEROL SULFATE (2.5 MG/3ML) 0.083% IN NEBU: 5.0000 mg | INHALATION_SOLUTION | Freq: Once | RESPIRATORY_TRACT | Status: DC

## 2016-11-20 MED ORDER — ALBUTEROL SULFATE (2.5 MG/3ML) 0.083% IN NEBU
2.5000 mg | INHALATION_SOLUTION | RESPIRATORY_TRACT | Status: DC | PRN
  Filled 2016-11-20: qty 3

## 2016-11-20 MED ORDER — INSULIN ASPART 100 UNIT/ML ~~LOC~~ SOLN
0.0000 [IU] | Freq: Three times a day (TID) | SUBCUTANEOUS | Status: DC
  Administered 2016-11-21: 3 [IU] via SUBCUTANEOUS
  Administered 2016-11-21 (×2): 2 [IU] via SUBCUTANEOUS
  Administered 2016-11-22: 3 [IU] via SUBCUTANEOUS
  Administered 2016-11-22 (×2): 2 [IU] via SUBCUTANEOUS
  Administered 2016-11-23: 5 [IU] via SUBCUTANEOUS
  Administered 2016-11-23: 1 [IU] via SUBCUTANEOUS
  Administered 2016-11-23: 2 [IU] via SUBCUTANEOUS
  Administered 2016-11-24: 9 [IU] via SUBCUTANEOUS
  Administered 2016-11-24 – 2016-11-25 (×2): 3 [IU] via SUBCUTANEOUS
  Administered 2016-11-25: 2 [IU] via SUBCUTANEOUS
  Administered 2016-11-25: 3 [IU] via SUBCUTANEOUS
  Administered 2016-11-26: 7 [IU] via SUBCUTANEOUS
  Administered 2016-11-26: 3 [IU] via SUBCUTANEOUS
  Administered 2016-11-26: 5 [IU] via SUBCUTANEOUS
  Administered 2016-11-27: 3 [IU] via SUBCUTANEOUS
  Administered 2016-11-27: 7 [IU] via SUBCUTANEOUS
  Administered 2016-11-27: 5 [IU] via SUBCUTANEOUS
  Administered 2016-11-28: 9 [IU] via SUBCUTANEOUS
  Administered 2016-11-28: 5 [IU] via SUBCUTANEOUS
  Administered 2016-11-28: 7 [IU] via SUBCUTANEOUS
  Administered 2016-11-29: 3 [IU] via SUBCUTANEOUS
  Administered 2016-11-29: 9 [IU] via SUBCUTANEOUS
  Filled 2016-11-20: qty 5
  Filled 2016-11-20: qty 2
  Filled 2016-11-20: qty 3
  Filled 2016-11-20: qty 1
  Filled 2016-11-20: qty 3
  Filled 2016-11-20 (×2): qty 7
  Filled 2016-11-20: qty 3
  Filled 2016-11-20: qty 7
  Filled 2016-11-20 (×2): qty 9
  Filled 2016-11-20: qty 3
  Filled 2016-11-20 (×2): qty 2
  Filled 2016-11-20: qty 3
  Filled 2016-11-20: qty 5
  Filled 2016-11-20: qty 3
  Filled 2016-11-20: qty 9
  Filled 2016-11-20 (×2): qty 2
  Filled 2016-11-20 (×2): qty 5
  Filled 2016-11-20: qty 3
  Filled 2016-11-20: qty 2
  Filled 2016-11-20: qty 5

## 2016-11-20 MED ORDER — INSULIN REGULAR HUMAN 100 UNIT/ML IJ SOLN
10.0000 [IU] | Freq: Three times a day (TID) | INTRAMUSCULAR | Status: DC
  Filled 2016-11-20 (×2): qty 0.1

## 2016-11-20 MED ORDER — CARVEDILOL 25 MG PO TABS
25.0000 mg | ORAL_TABLET | Freq: Two times a day (BID) | ORAL | Status: DC
  Administered 2016-11-20 – 2016-11-29 (×18): 25 mg via ORAL
  Filled 2016-11-20 (×18): qty 1

## 2016-11-20 MED ORDER — ASPIRIN 81 MG PO CHEW: 324.0000 mg | CHEWABLE_TABLET | Freq: Once | ORAL | Status: DC

## 2016-11-20 MED ORDER — ISOSORBIDE DINITRATE 10 MG PO TABS
20.0000 mg | ORAL_TABLET | Freq: Three times a day (TID) | ORAL | Status: DC
  Administered 2016-11-20 – 2016-11-29 (×26): 20 mg via ORAL
  Filled 2016-11-20 (×26): qty 2

## 2016-11-20 MED ORDER — ASPIRIN 81 MG PO CHEW
324.0000 mg | CHEWABLE_TABLET | Freq: Once | ORAL | Status: AC
  Administered 2016-11-20: 324 mg via ORAL
  Filled 2016-11-20: qty 4

## 2016-11-20 MED ORDER — TRAMADOL HCL 50 MG PO TABS
50.0000 mg | ORAL_TABLET | Freq: Two times a day (BID) | ORAL | Status: DC | PRN
  Administered 2016-11-20: 50 mg via ORAL
  Filled 2016-11-20: qty 1

## 2016-11-20 MED ORDER — INSULIN ASPART 100 UNIT/ML ~~LOC~~ SOLN
10.0000 [IU] | Freq: Three times a day (TID) | SUBCUTANEOUS | Status: DC
  Administered 2016-11-21 – 2016-11-24 (×10): 10 [IU] via SUBCUTANEOUS
  Filled 2016-11-20 (×10): qty 10

## 2016-11-20 MED ORDER — DEXTROSE 5 % IV SOLN: 1.0000 g | Freq: Once | INTRAVENOUS | Status: DC

## 2016-11-20 MED ORDER — INSULIN DETEMIR 100 UNIT/ML ~~LOC~~ SOLN
20.0000 [IU] | Freq: Every day | SUBCUTANEOUS | Status: DC
  Administered 2016-11-20: 20 [IU] via SUBCUTANEOUS
  Filled 2016-11-20 (×2): qty 0.2

## 2016-11-20 MED ORDER — HEPARIN SODIUM (PORCINE) 5000 UNIT/ML IJ SOLN
5000.0000 [IU] | Freq: Three times a day (TID) | INTRAMUSCULAR | Status: DC
  Administered 2016-11-20 – 2016-11-21 (×3): 5000 [IU] via SUBCUTANEOUS
  Filled 2016-11-20 (×3): qty 1

## 2016-11-20 MED ORDER — FUROSEMIDE 10 MG/ML IJ SOLN
60.0000 mg | Freq: Once | INTRAMUSCULAR | Status: AC
  Administered 2016-11-20: 60 mg via INTRAVENOUS
  Filled 2016-11-20: qty 8

## 2016-11-20 NOTE — ED Notes (Signed)
Report called to floor nurse Kat rn.  Pt has cellulitis/ulcer to right lower leg.  Redness noted and bandage to ulcer.  Pt treated t wound center for 3 months.  Pt also reports no blood pressure meds for 3 days.  States he ran out of meds.   Pt lert.  Speech clear.  nsr on monitor

## 2016-11-20 NOTE — ED Provider Notes (Signed)
[facility] Emergency Department Provider Note  ____________________________________________  Time seen: Approximately 5:04 PM  I have reviewed the triage vital signs and the nursing notes.   HISTORY  Chief Complaint Shortness of Breath    HPI [name] is a 50 y.o. male male with a history of congestive heart failure, nonsustained ventricular tachycardia, HTN, HL, DM, morbid obesity, presenting with shortness of breath, cough, chest pain. The patient reports that since Friday, he has had progressively worsening shortness of breath. He has also had a cough productive of yellow sputum with some mild congestion but no sore throat or ear pain. No fevers or chills. In addition, he has had intermittent central chest "burning" that is only noted when he is hungry right before eating. He reports bilateral lower extremity swelling.  No personal or family history of blood clots. In triage, the patient had an oxygen saturation of 79% on room air, and improved to 95% on 3 L nasal cannula.   Past Medical History:  Diagnosis Date  . Bell's palsy   . CHF (congestive heart failure) ([place])   . Diabetes mellitus without complication ([place])   . Gout   . Hypercholesteremia   . Hypertension   . NSVT (nonsustained ventricular tachycardia) [facility])     Patient Active Problem List   Diagnosis Date Noted  . Controlled type 2 diabetes with leg or foot ulcer 06/25/2016    No past surgical history on file.  Current Outpatient Rx  . Order #: [id number] Class: Historical Med  . Order #: [id number] Class: Historical Med  . Order #: [id number] Class: Historical Med  . Order #: [id number] Class: Historical Med  . Order #: [id number] Class: Historical Med  . Order #: [id number] Class: Historical Med  . Order #: [id number] Class: Historical Med  . Order #: [id number] Class: Historical Med  . Order #: [id number] Class: Historical Med  . Order #: [id number] Class: Historical Med  . Order #:  [id number] Class: Historical Med  . Order #: [id number] Class: Historical Med  . Order #: [id number] Class: Historical Med  . Order #: [id number] Class: Historical Med  . Order #: [id number] Class: Historical Med  . Order #: [id number] Class: Historical Med  . Order #: [id number] Class: Historical Med    Allergies Patient has no known allergies.  No family history on file.  Social History Social History  Substance Use Topics  . Smoking status: Never Smoker  . Smokeless tobacco: Never Used  . Alcohol use Yes    Review of Systems Constitutional: No fever/chills.No lightheadedness or syncope. Positive decreased exercise tolerance. No reported weight gain. Eyes: No visual changes. No eye discharge. ENT: No sore throat. Positive congestion and rhinorrhea. Cardiovascular: Positive chest pain. Denies palpitations. Respiratory: Positive shortness of breath.  Positive decreased exercise tolerance. Positive productive cough. Gastrointestinal: No abdominal pain.  No nausea, no vomiting.  No diarrhea.  No constipation. Genitourinary: Negative for dysuria. Musculoskeletal: Negative for back pain. Positive bilateral lower extremity swelling.  Skin: Negative for rash. Neurological: Negative for headaches. No focal numbness, tingling or weakness.   10-point ROS otherwise negative.  ____________________________________________   PHYSICAL EXAM:  VITAL SIGNS: ED Triage Vitals  Enc Vitals Group     BP 11/20/16 1648 (!) 171/101     Pulse Rate 11/20/16 1648 92     Resp --      Temp 11/20/16 1648 98.4 F (36.9 C)     Temp Source 11/20/16 1648 Oral     SpO2 11/20/16 1648 (!) 79 %     Weight 11/20/16 1655 280 lb (  127 kg)     Height 11/20/16 1655 5' (1.524 m)     Head Circumference --      Peak Flow --      Pain Score 11/20/16 1656 10     Pain Loc --      Pain Edu? --      Excl. in [place]? --     Constitutional: Alert and oriented.Chronically ill-appearing with increased work of breathing but  nontoxic. Answers questions appropriately. Eyes: Conjunctivae are normal.  EOMI. No scleral icterus. No eye discharge. Head: Atraumatic. Nose: No congestion/rhinnorhea. Mouth/Throat: Mucous membranes are moist.  Neck: No stridor.  Supple.  No JVD. No meningismus. Cardiovascular: Normal rate, regular rhythm. No murmurs, rubs or gallops.  Respiratory: Increased work of breathing with accessory muscle use but no retractions, tachypnea, and hypoxia. No wheezes rales or rhonchi. Fair air exchange bilaterally. Gastrointestinal: Obese. Soft, nontender and nondistended.  No guarding or rebound.  No peritoneal signs. Musculoskeletal: Bilateral lower extremity edema that is pitting to the mid thigh. No palpable cords in the calves, no calf tenderness, negative Homans sign. Neurologic:  A&Ox3.  Speech is clear.  Face and smile are symmetric.  EOMI.  Moves all extremities well. Skin:  Skin is warm, dry and intact. No rash noted. Psychiatric: Mood and affect are normal. Speech and behavior are normal.  Normal judgement  ____________________________________________   LABS (all labs ordered are listed, but only abnormal results are displayed)  Labs Reviewed  CBC - Abnormal; Notable for the following:       Result Value   WBC 12.4 (*)    Hemoglobin 12.7 (*)    HCT 39.9 (*)    MCV 78.4 (*)    MCH 25.0 (*)    MCHC 31.9 (*)    RDW 17.3 (*)    All other components within normal limits  BASIC METABOLIC PANEL - Abnormal; Notable for the following:    CO2 35 (*)    Glucose, Bld 210 (*)    Calcium 8.8 (*)    All other components within normal limits  TROPONIN I - Abnormal; Notable for the following:    Troponin I 0.07 (*)    All other components within normal limits  BRAIN NATRIURETIC PEPTIDE - Abnormal; Notable for the following:    B Natriuretic Peptide 111.0 (*)    All other components within normal limits  BLOOD GAS, VENOUS - Abnormal; Notable for the following:    pH, Ven 7.44 (*)     Bicarbonate 37.4 (*)    Acid-Base Excess 11.1 (*)    All other components within normal limits  CULTURE, BLOOD (ROUTINE X 2)  CULTURE, BLOOD (ROUTINE X 2)   ____________________________________________  EKG  ED ECG REPORT I, Eula Listen, the attending physician, personally viewed and interpreted this ECG.   Date: 11/20/2016  EKG Time: 1646  Rate: 93  Rhythm: normal sinus rhythm  Axis: leftward  Intervals:none  ST&T Change: No ST elevation  ____________________________________________  RADIOLOGY  Dg Chest Port 1 View  Result Date: 11/20/2016 CLINICAL DATA:  Initial evaluation for acute shortness of breath. EXAM: PORTABLE CHEST 1 VIEW COMPARISON:  Prior radiograph from 06/13/2016. FINDINGS: Moderate cardiomegaly, stable. Mediastinal silhouette within normal limits. Lungs mildly hypoinflated. Diffuse pulmonary vascular congestion with indistinctness of the interstitial markings, suggestive of mild diffuse pulmonary interstitial edema. Small left pleural effusion is suspected. No definite focal infiltrates. No pneumothorax. No acute osseous abnormality. IMPRESSION: 1. Cardiomegaly with diffuse pulmonary vascular congestion and indistinctness of the  interstitial markings, suggestive of mild diffuse pulmonary interstitial edema. 2. Probable small left pleural effusion. Electronically Signed   By: Jeannine Boga M.D.   On: 11/20/2016 18:44    ____________________________________________   PROCEDURES  Procedure(s) performed: None  Procedures  Critical Care performed: Yes  ____________________________________________   INITIAL IMPRESSION / ASSESSMENT AND PLAN / ED COURSE  Pertinent labs & imaging results that were available during my care of the patient were reviewed by me and considered in my medical decision making (see chart for details).  50 y.o. male presenting with 5 days of progressively worsening shortness of breath, episodes of chest pain, cough,  decreased exercise tolerance, with hypoxia. Overall, the patient does appear to be fluid overloaded and he may have an acute CHF exacerbation. I will treat him with nitroglycerin and Lasix. He has had a cough but no fever, so it is possible this is from pulmonary edema, although we will also check him for infection, pneumonia. The patient has intermittent he had an atypical chest pain that is a burning sensation, so CAD or ACS is less likely but we will follow this as well. Anticipate admission to the hospital for hypoxia.  CRITICAL CARE Performed by: Eula Listen   Total critical care time: 40 minutes  Critical care time was exclusive of separately billable procedures and treating other patients.  Critical care was necessary to treat or prevent imminent or life-threatening deterioration.  Critical care was time spent personally by me on the following activities: development of treatment plan with patient and/or surrogate as well as nursing, discussions with consultants, evaluation of patient's response to treatment, examination of patient, obtaining history from patient or surrogate, ordering and performing treatments and interventions, ordering and review of laboratory studies, ordering and review of radiographic studies, pulse oximetry and re-evaluation of patient's condition.  ----------------------------------------- 6:41 PM on 11/20/2016 -----------------------------------------  The patient has a BNP of only 111. I do not have the chest x-ray results yet, but he does have a mildly elevated white blood cell count. At this time, we'll plan to give him antibiotics to cover him for pneumonia. In addition, the patient has a troponin of 0.07 which will need to be trended. He will be given an aspirin.  ----------------------------------------- 6:52 PM on 11/20/2016 -----------------------------------------  The patient's chest x-ray is consistent with pulmonary edema and a left  pleural effusion. I'll continue to work on diuresis and the patient as well as cover him for infection. Plan admission to the hospital. ____________________________________________  FINAL CLINICAL IMPRESSION(S) / ED DIAGNOSES  Final diagnoses:  Elevated troponin  Hypoxia  Community acquired pneumonia, unspecified laterality  Acute on chronic congestive heart failure, unspecified congestive heart failure type [facility])    Clinical Course       NEW MEDICATIONS STARTED DURING THIS VISIT:  New Prescriptions   No medications on file      Eula Listen, MD 11/20/16 1853

## 2016-11-20 NOTE — Progress Notes (Signed)
Pt arrived from ED alert and oriented. Telemetry box verified with Sonia Baller NT . Skin verified with Yasmin RN . Pt had skin issues relating to psoriasis. Ulcer noted on right lower leg. Ulcer was bandaged per patient at the would clinic. Pt is oriented to staff and equipment. No concerns offered at this time. VS taken.

## 2016-11-20 NOTE — ED Notes (Signed)
Pt ate dinner meal  

## 2016-11-20 NOTE — ED Triage Notes (Signed)
Pt has sob for 5 days.  No chest pain.  Pt coughing up yellow phlegm    Non smoker.  Pt alert.

## 2016-11-20 NOTE — ED Notes (Signed)
Pt waiting for admission.  Family with pt.

## 2016-11-20 NOTE — ED Notes (Signed)
Iv started after severl attempts.  Pt tolerated without diff.  meds given.  Pt alert.

## 2016-11-20 NOTE — ED Notes (Signed)
Pt voided 800 cc urine.  Pt alert.  No chest pain now.

## 2016-11-20 NOTE — ED Notes (Signed)
Lab called with troponin of 0.07  md aware

## 2016-11-20 NOTE — ED Notes (Signed)
Pt reports right shoulder pain, intermittent chest pain, cough for 5 days.  oxygens sats low on ra, pt placed on 2 liters with improvement.  Pt alert.  Skin warm and dry.

## 2016-11-20 NOTE — H&P (Signed)
[place] [facility] [place] NAME: [name]    MR#:  [id number]  DATE OF BIRTH:  1966-07-31  DATE OF ADMISSION:  11/20/2016  PRIMARY CARE PHYSICIAN: Pcp Not In System   REQUESTING/REFERRING PHYSICIAN: veronese caroline  CHIEF COMPLAINT:   Chief Complaint  Patient presents with  . Shortness of Breath    HISTORY OF PRESENT ILLNESS: [name]  is a 50 y.o. male with a known history of Congestive heart failure, ischemic cardiomyopathy ejection fraction 40% as per the [facility] records, diabetes, hypercholesterolemia, hypertension, nonsustained ventricular tachycardia, Bell's palsy- he ran out of his insurance coverage and so could not afford his medication anymore so 3 days ago he ran out of all his medications. For last 4-5 days he started feeling short of breath on minimal exertion and also have coughing. He has yellowish brown sputum production without any fever. He also noted worsening edema on his legs. In ER he was noted to be hypoxic and chest x-ray was suggestive of possible pneumonia plus pulmonary edema so given his admission.  PAST MEDICAL HISTORY:   Past Medical History:  Diagnosis Date  . Bell's palsy   . CHF (congestive heart failure) ([place])   . Diabetes mellitus without complication ([place])   . Gout   . Hypercholesteremia   . Hypertension   . NSVT (nonsustained ventricular tachycardia) ([facility])     PAST SURGICAL HISTORY: No past surgical history on file.  SOCIAL HISTORY:  Social History  Substance Use Topics  . Smoking status: Never Smoker  . Smokeless tobacco: Never Used  . Alcohol use Yes    FAMILY HISTORY:  Family History  Problem Relation Age of Onset  . Heart failure Mother   . Kidney failure Brother     DRUG ALLERGIES: No Known Allergies  REVIEW OF SYSTEMS:   CONSTITUTIONAL: No fever,Positive for fatigue or weakness.  EYES: No blurred or double vision.  EARS, NOSE, AND THROAT: No tinnitus or ear  pain.  RESPIRATORY: Positive for cough, shortness of breath, no wheezing or hemoptysis.  CARDIOVASCULAR: No chest pain, orthopnea, bilateral edema.  GASTROINTESTINAL: No nausea, vomiting, diarrhea or abdominal pain.  GENITOURINARY: No dysuria, hematuria.  ENDOCRINE: No polyuria, nocturia,  HEMATOLOGY: No anemia, easy bruising or bleeding SKIN: No rash or lesion. MUSCULOSKELETAL: No joint pain or arthritis.   NEUROLOGIC: No tingling, numbness, weakness.  PSYCHIATRY: No anxiety or depression.   MEDICATIONS AT HOME:  Prior to Admission medications   Medication Sig Start Date End Date Taking? Authorizing Provider  albuterol (PROVENTIL HFA;VENTOLIN HFA) 108 (90 Base) MCG/ACT inhaler Inhale 2 puffs into the lungs every 4 (four) hours as needed for wheezing or shortness of breath.   Yes Historical Provider, MD  allopurinol (ZYLOPRIM) 100 MG tablet Take 200 mg by mouth daily.   Yes Historical Provider, MD  aspirin EC 81 MG tablet Take 81 mg by mouth daily.   Yes Historical Provider, MD  atorvastatin (LIPITOR) 40 MG tablet Take 40 mg by mouth at bedtime.   Yes Historical Provider, MD  carvedilol (COREG) 25 MG tablet Take 25 mg by mouth every 12 (twelve) hours.   Yes Historical Provider, MD  clobetasol ointment (TEMOVATE) 3.47 % Apply 1 application topically 2 (two) times daily.   Yes Historical Provider, MD  clotrimazole (LOTRIMIN) 1 % cream Apply 1 application topically.   Yes Historical Provider, MD  gabapentin (NEURONTIN) 300 MG capsule Take 300 mg by mouth 3 (three) times daily.  Yes Historical Provider, MD  hydrALAZINE (APRESOLINE) 25 MG tablet Take 25 mg by mouth 3 (three) times daily.   Yes Historical Provider, MD  insulin NPH Human (HUMULIN N,NOVOLIN N) 100 UNIT/ML injection Inject 40 Units into the skin at bedtime.   Yes Historical Provider, MD  insulin regular (NOVOLIN R,HUMULIN R) 100 units/mL injection Inject 40 Units into the skin 3 (three) times daily before meals.   Yes Historical  Provider, MD  isosorbide dinitrate (ISORDIL) 20 MG tablet Take 20 mg by mouth 3 (three) times daily.   Yes Historical Provider, MD  magnesium oxide (MAG-OX) 400 (241.3 Mg) MG tablet Take 800 mg by mouth 2 (two) times daily.   Yes Historical Provider, MD  methocarbamol (ROBAXIN) 500 MG tablet Take 500 mg by mouth 3 (three) times daily as needed for muscle spasms.   Yes Historical Provider, MD  omeprazole (PRILOSEC) 20 MG capsule Take 20 mg by mouth 2 (two) times daily before a meal.   Yes Historical Provider, MD  potassium chloride SA (K-DUR,KLOR-CON) 20 MEQ tablet Take 20 mEq by mouth daily.   Yes Historical Provider, MD  torsemide (DEMADEX) 20 MG tablet Take 20 mg by mouth 2 (two) times daily.   Yes Historical Provider, MD      PHYSICAL EXAMINATION:   VITAL SIGNS: Blood pressure (!) 171/101, pulse 90, temperature 98.4 F (36.9 C), temperature source Oral, resp. rate (!) 24, height 5' (1.524 m), weight 127 kg (280 lb), SpO2 92 %.  GENERAL:  50 y.o.-year-old patient lying in the bed with no acute distress.  EYES: Pupils equal, round, reactive to light and accommodation. No scleral icterus. Extraocular muscles intact.  HEENT: Head atraumatic, normocephalic. Oropharynx and nasopharynx clear.  NECK:  Supple, no jugular venous distention. No thyroid enlargement, no tenderness.  LUNGS: Normal breath sounds bilaterally, no wheezing, bilateral crepitation. No use of accessory muscles of respiration. Requiring supplemental oxygen.  CARDIOVASCULAR: S1, S2 normal. No murmurs, rubs, or gallops.  ABDOMEN: Soft, nontender, nondistended. Bowel sounds present. No organomegaly or mass.  EXTREMITIES: Bilateral pedal edema, no cyanosis, or clubbing.  NEUROLOGIC: Cranial nerves II through XII are intact. Muscle strength 5/5 in all extremities. Sensation intact. Gait not checked.  PSYCHIATRIC: The patient is alert and oriented x 3.  SKIN: Right leg is slightly red and has chronic skin changes of psoriasis and  eczema.   LABORATORY PANEL:   CBC  Recent Labs Lab 11/20/16 1702  WBC 12.4*  HGB 12.7*  HCT 39.9*  PLT 171  MCV 78.4*  MCH 25.0*  MCHC 31.9*  RDW 17.3*   ------------------------------------------------------------------------------------------------------------------  Chemistries   Recent Labs Lab 11/20/16 1702  NA 142  K 3.6  CL 101  CO2 35*  GLUCOSE 210*  BUN 12  CREATININE 1.21  CALCIUM 8.8*   ------------------------------------------------------------------------------------------------------------------ estimated creatinine clearance is 85.3 mL/min (by C-G formula based on SCr of 1.21 mg/dL). ------------------------------------------------------------------------------------------------------------------ No results for input(s): TSH, T4TOTAL, T3FREE, THYROIDAB in the last 72 hours.  Invalid input(s): FREET3   Coagulation profile No results for input(s): INR, PROTIME in the last 168 hours. ------------------------------------------------------------------------------------------------------------------- No results for input(s): DDIMER in the last 72 hours. -------------------------------------------------------------------------------------------------------------------  Cardiac Enzymes  Recent Labs Lab 11/20/16 1702  TROPONINI 0.07*   ------------------------------------------------------------------------------------------------------------------ Invalid input(s): POCBNP  ---------------------------------------------------------------------------------------------------------------  Urinalysis No results found for: COLORURINE, APPEARANCEUR, LABSPEC, PHURINE, GLUCOSEU, HGBUR, BILIRUBINUR, KETONESUR, PROTEINUR, UROBILINOGEN, NITRITE, LEUKOCYTESUR   RADIOLOGY: Dg Chest Port 1 View  Result Date: 11/20/2016 CLINICAL DATA:  Initial evaluation for acute shortness of  breath. EXAM: PORTABLE CHEST 1 VIEW COMPARISON:  Prior radiograph from 06/13/2016.  FINDINGS: Moderate cardiomegaly, stable. Mediastinal silhouette within normal limits. Lungs mildly hypoinflated. Diffuse pulmonary vascular congestion with indistinctness of the interstitial markings, suggestive of mild diffuse pulmonary interstitial edema. Small left pleural effusion is suspected. No definite focal infiltrates. No pneumothorax. No acute osseous abnormality. IMPRESSION: 1. Cardiomegaly with diffuse pulmonary vascular congestion and indistinctness of the interstitial markings, suggestive of mild diffuse pulmonary interstitial edema. 2. Probable small left pleural effusion. Electronically Signed   By: Jeannine Boga M.D.   On: 11/20/2016 18:44    EKG: Orders placed or performed during the hospital encounter of 11/20/16  . ED EKG  . ED EKG  . EKG 12-Lead  . EKG 12-Lead    IMPRESSION AND PLAN:  * Acute respiratory failure with hypoxia   Acute on chronic congestive heart failure-systolic   Ischemic cardiomyopathy ejection fraction 40%.   Community-acquired pneumonia.    Medication noncompliance.   IV Lasix, intake and output monitoring, daily body weight monitoring.   Echocardiogram, monitor on telemetry, follow serial troponin.   Cardiology consult.   Supplemental oxygen, try to taper.   IV Rocephin and azithromycin, get sputum culture.  * Uncontrolled hypertension   Due to noncompliance to medication.   Resume home medication.  * Diabetes   Start with a lower dose.  * Hyperlipidemia   Continue statin.  All the records are reviewed and case discussed with ED provider. Management plans discussed with the patient, family and they are in agreement.  CODE STATUS: Full code Code Status History    This patient does not have a recorded code status. Please follow your organizational policy for patients in this situation.       TOTAL TIME TAKING CARE OF THIS PATIENT: 50 minutes.    Vaughan Basta M.D on 11/20/2016   Between 7am to 6pm - Pager -  [phone]  After 6pm go to [email or web address] - password [email or web address] [facility]  Office  [phone]  CC: Primary care physician; Pcp Not In System   Note: This dictation was prepared with Dragon dictation along with smaller phrase technology. Any transcriptional errors that result from this process are unintentional.

## 2016-11-21 ENCOUNTER — Inpatient Hospital Stay
Admit: 2016-11-21 | Discharge: 2016-11-21 | Disposition: A | Discharge status: Home / Self Care | Payer: Medicaid Other | Attending: Internal Medicine | Admitting: Internal Medicine

## 2016-11-21 LAB — CBC
HCT: 40.4 % (ref 40.0–52.0)
Hemoglobin: 12.8 g/dL — ABNORMAL LOW (ref 13.0–18.0)
MCH: 24.9 pg — ABNORMAL LOW (ref 26.0–34.0)
MCHC: 31.8 g/dL — ABNORMAL LOW (ref 32.0–36.0)
MCV: 78.4 fL — ABNORMAL LOW (ref 80.0–100.0)
Platelets: 161 10*3/uL (ref 150–440)
RBC: 5.15 MIL/uL (ref 4.40–5.90)
RDW: 17.7 % — ABNORMAL HIGH (ref 11.5–14.5)
WBC: 10.1 10*3/uL (ref 3.8–10.6)

## 2016-11-21 LAB — BASIC METABOLIC PANEL
Anion gap: 7 (ref 5–15)
BUN: 15 mg/dL (ref 6–20)
CO2: 36 mmol/L — ABNORMAL HIGH (ref 22–32)
Calcium: 8.5 mg/dL — ABNORMAL LOW (ref 8.9–10.3)
Chloride: 94 mmol/L — ABNORMAL LOW (ref 101–111)
Creatinine, Ser: 1.2 mg/dL (ref 0.61–1.24)
GFR calc Af Amer: >60 mL/min (ref >60)
GFR calc non Af Amer: >60 mL/min (ref >60)
Glucose, Bld: 225 mg/dL — ABNORMAL HIGH (ref 65–99)
Potassium: 3.5 mmol/L (ref 3.5–5.1)
Sodium: 137 mmol/L (ref 135–145)

## 2016-11-21 LAB — TROPONIN I
Troponin I: 0.06 ng/mL (ref <0.03)
Troponin I: 0.05 ng/mL (ref <0.03)

## 2016-11-21 LAB — GLUCOSE, CAPILLARY
Glucose-Capillary: 198 mg/dL — ABNORMAL HIGH (ref 65–99)
Glucose-Capillary: 216 mg/dL — ABNORMAL HIGH (ref 65–99)
Glucose-Capillary: 151 mg/dL — ABNORMAL HIGH (ref 65–99)
Glucose-Capillary: 202 mg/dL — ABNORMAL HIGH (ref 65–99)

## 2016-11-21 MED ORDER — FUROSEMIDE 10 MG/ML IJ SOLN
40.0000 mg | Freq: Two times a day (BID) | INTRAMUSCULAR | Status: DC
Start: 2016-11-21 — End: 2016-11-25
  Administered 2016-11-21 – 2016-11-25 (×8): 40 mg via INTRAVENOUS
  Filled 2016-11-21 (×8): qty 4

## 2016-11-21 MED ORDER — INSULIN DETEMIR 100 UNIT/ML ~~LOC~~ SOLN
30.0000 [IU] | Freq: Every day | SUBCUTANEOUS | Status: DC
  Administered 2016-11-21: 30 [IU] via SUBCUTANEOUS
  Filled 2016-11-21 (×2): qty 0.3

## 2016-11-21 MED ORDER — SACUBITRIL-VALSARTAN 24-26 MG PO TABS
1.0000 | ORAL_TABLET | Freq: Two times a day (BID) | ORAL | Status: DC
  Administered 2016-11-21 – 2016-11-29 (×17): 1 via ORAL
  Filled 2016-11-21 (×17): qty 1

## 2016-11-21 MED ORDER — ENOXAPARIN SODIUM 40 MG/0.4ML ~~LOC~~ SOLN
40.0000 mg | Freq: Two times a day (BID) | SUBCUTANEOUS | Status: DC
  Administered 2016-11-21 – 2016-11-29 (×16): 40 mg via SUBCUTANEOUS
  Filled 2016-11-21 (×16): qty 0.4

## 2016-11-21 MED ORDER — ACETAMINOPHEN 325 MG PO TABS
650.0000 mg | ORAL_TABLET | Freq: Four times a day (QID) | ORAL | Status: DC | PRN
Start: 2016-11-21 — End: 2016-11-29
  Administered 2016-11-24: 650 mg via ORAL
  Filled 2016-11-21: qty 2

## 2016-11-21 MED ORDER — NITROGLYCERIN 2 % TD OINT
0.5000 [in_us] | TOPICAL_OINTMENT | Freq: Once | TRANSDERMAL | Status: AC
  Administered 2016-11-21: 0.5 [in_us] via TOPICAL
  Filled 2016-11-21: qty 1

## 2016-11-21 MED ORDER — HYDRALAZINE HCL 50 MG PO TABS
50.0000 mg | ORAL_TABLET | Freq: Three times a day (TID) | ORAL | Status: DC
  Administered 2016-11-21 – 2016-11-26 (×15): 50 mg via ORAL
  Filled 2016-11-21 (×15): qty 1

## 2016-11-21 MED ORDER — AZITHROMYCIN 250 MG PO TABS
250.0000 mg | ORAL_TABLET | Freq: Every day | ORAL | Status: DC
  Administered 2016-11-21 – 2016-11-22 (×2): 250 mg via ORAL
  Filled 2016-11-21 (×2): qty 1

## 2016-11-21 NOTE — Progress Notes (Signed)
[name] is a 50 y.o. male  [id number]  Primary Cardiologist: Neoma Laming Reason for Consultation: Shortness of breath  HPI: This is a 50 year old African-American male with a past medical history of Bell's palsy CHF doubt hypertension nonsustained ventricular tachycardia presented to the hospital with shortness of breath orthopnea PND and leg swelling. Patient was also having pleuritic chest pain. Patient was hypoxic and was found to have pneumonia and is being treated for community-acquired pneumonia.   Review of Systems: Patient has chest pain and shortness of breath along with PND orthopnea and leg swelling   Past Medical History:  Diagnosis Date  . Bell's palsy   . CHF (congestive heart failure) ([place])   . Diabetes mellitus without complication ([place])   . Gout   . Hypercholesteremia   . Hypertension   . NSVT (nonsustained ventricular tachycardia) ([facility])     Medications Prior to Admission  Medication Sig Dispense Refill  . albuterol (PROVENTIL HFA;VENTOLIN HFA) 108 (90 Base) MCG/ACT inhaler Inhale 2 puffs into the lungs every 4 (four) hours as needed for wheezing or shortness of breath.    . allopurinol (ZYLOPRIM) 100 MG tablet Take 200 mg by mouth daily.    [name] Kitchen aspirin EC 81 MG tablet Take 81 mg by mouth daily.    [name] Kitchen atorvastatin (LIPITOR) 40 MG tablet Take 40 mg by mouth at bedtime.    . carvedilol (COREG) 25 MG tablet Take 25 mg by mouth every 12 (twelve) hours.    . clobetasol ointment (TEMOVATE) 4.65 % Apply 1 application topically 2 (two) times daily.    . clotrimazole (LOTRIMIN) 1 % cream Apply 1 application topically.    . gabapentin (NEURONTIN) 300 MG capsule Take 300 mg by mouth 3 (three) times daily.     . hydrALAZINE (APRESOLINE) 25 MG tablet Take 25 mg by mouth 3 (three) times daily.    . insulin NPH Human (HUMULIN N,NOVOLIN N) 100 UNIT/ML injection Inject 40 Units into the skin at bedtime.    . insulin regular (NOVOLIN R,HUMULIN R) 100 units/mL injection  Inject 40 Units into the skin 3 (three) times daily before meals.    . isosorbide dinitrate (ISORDIL) 20 MG tablet Take 20 mg by mouth 3 (three) times daily.    . magnesium oxide (MAG-OX) 400 (241.3 Mg) MG tablet Take 800 mg by mouth 2 (two) times daily.    . methocarbamol (ROBAXIN) 500 MG tablet Take 500 mg by mouth 3 (three) times daily as needed for muscle spasms.    [name] Kitchen omeprazole (PRILOSEC) 20 MG capsule Take 20 mg by mouth 2 (two) times daily before a meal.    . potassium chloride SA (K-DUR,KLOR-CON) 20 MEQ tablet Take 20 mEq by mouth daily.    [name] Kitchen torsemide (DEMADEX) 20 MG tablet Take 20 mg by mouth 2 (two) times daily.       [name] Kitchen allopurinol  200 mg Oral Daily  . aspirin  324 mg Oral Once  . aspirin EC  81 mg Oral Daily  . atorvastatin  40 mg Oral QHS  . azithromycin  250 mg Intravenous Q24H  . carvedilol  25 mg Oral Q12H  . cefTRIAXone  1 g Intravenous Q24H  . furosemide  20 mg Intravenous Q8H  . gabapentin  300 mg Oral TID  . heparin  5,000 Units Subcutaneous Q8H  . hydrALAZINE  25 mg Oral TID  . insulin aspart  0-9 Units Subcutaneous TID WC  . insulin aspart  10 Units Subcutaneous TID  WC  . insulin detemir  20 Units Subcutaneous QHS  . ipratropium-albuterol  3 mL Nebulization Q4H  . isosorbide dinitrate  20 mg Oral TID  . pantoprazole  40 mg Oral Daily  . sodium chloride flush  3 mL Intravenous Q12H    Infusions:   No Known Allergies  Social History   Social History  . Marital status: Single    Spouse name: N/A  . Number of children: N/A  . Years of education: N/A   Occupational History  . Not on file.   Social History Main Topics  . Smoking status: Never Smoker  . Smokeless tobacco: Never Used  . Alcohol use Yes  . Drug use: Unknown  . Sexual activity: Not on file   Other Topics Concern  . Not on file   Social History Narrative  . No narrative on file    Family History  Problem Relation Age of Onset  . Heart failure Mother   . Kidney failure Brother      PHYSICAL EXAM: Vitals:   11/21/16 0839 11/21/16 1111  BP: (!) 159/99 (!) 145/91  Pulse: 77 77  Resp: 20 20  Temp: 97.6 F (36.4 C) 98.1 F (36.7 C)     Intake/Output Summary (Last 24 hours) at 11/21/16 1123 Last data filed at 11/21/16 0957  Gross per 24 hour  Intake              243 ml  Output             1150 ml  Net             -907 ml    General:  Well appearing. No respiratory difficulty HEENT: normal Neck: supple. no JVD. Carotids 2+ bilat; no bruits. No lymphadenopathy or thryomegaly appreciated. Cor: PMI nondisplaced. Regular rate & rhythm. No rubs, gallops or murmurs. Lungs: clear Abdomen: soft, nontender, nondistended. No hepatosplenomegaly. No bruits or masses. Good bowel sounds. Extremities: no cyanosis, clubbing, rash, edema Neuro: alert & oriented x 3, cranial nerves grossly intact. moves all 4 extremities w/o difficulty. Affect pleasant.  QPR:FFMBW rhythm with left atrial enlargement LVH and nonspecific ST-T changes  Results for orders placed or performed during the hospital encounter of 11/20/16 (from the past 24 hour(s))  CBC     Status: Abnormal   Collection Time: 11/20/16  5:02 PM  Result Value Ref Range   WBC 12.4 (H) 3.8 - 10.6 K/uL   RBC 5.08 4.40 - 5.90 MIL/uL   Hemoglobin 12.7 (L) 13.0 - 18.0 g/dL   HCT 39.9 (L) 40.0 - 52.0 %   MCV 78.4 (L) 80.0 - 100.0 fL   MCH 25.0 (L) 26.0 - 34.0 pg   MCHC 31.9 (L) 32.0 - 36.0 g/dL   RDW 17.3 (H) 11.5 - 14.5 %   Platelets 171 150 - 440 K/uL  Basic metabolic panel     Status: Abnormal   Collection Time: 11/20/16  5:02 PM  Result Value Ref Range   Sodium 142 135 - 145 mmol/L   Potassium 3.6 3.5 - 5.1 mmol/L   Chloride 101 101 - 111 mmol/L   CO2 35 (H) 22 - 32 mmol/L   Glucose, Bld 210 (H) 65 - 99 mg/dL   BUN 12 6 - 20 mg/dL   Creatinine, Ser 1.21 0.61 - 1.24 mg/dL   Calcium 8.8 (L) 8.9 - 10.3 mg/dL   GFR calc non Af Amer >60 >60 mL/min   GFR calc Af Amer >60 >60 mL/min  Anion gap 6 5 - 15   Troponin I     Status: Abnormal   Collection Time: 11/20/16  5:02 PM  Result Value Ref Range   Troponin I 0.07 (HH) <0.03 ng/mL  Brain natriuretic peptide     Status: Abnormal   Collection Time: 11/20/16  5:02 PM  Result Value Ref Range   B Natriuretic Peptide 111.0 (H) 0.0 - 100.0 pg/mL  Blood gas, venous     Status: Abnormal   Collection Time: 11/20/16  5:47 PM  Result Value Ref Range   pH, Ven 7.44 (H) 7.250 - 7.430   pCO2, Ven 55 44.0 - 60.0 mmHg   pO2, Ven 44.0 32.0 - 45.0 mmHg   Bicarbonate 37.4 (H) 20.0 - 28.0 mmol/L   Acid-Base Excess 11.1 (H) 0.0 - 2.0 mmol/L   O2 Saturation 81.6 %   Patient temperature 37.0    Collection site VEIN    Sample type VENOUS   Blood culture (routine x 2)     Status: None (Preliminary result)   Collection Time: 11/20/16  7:02 PM  Result Value Ref Range   Specimen Description BLOOD  RIGHT HAND    Special Requests BOTTLES DRAWN AEROBIC AND ANAEROBIC  AER5CC ANA5CC    Culture NO GROWTH < 12 HOURS    Report Status PENDING   Blood culture (routine x 2)     Status: None (Preliminary result)   Collection Time: 11/20/16  7:02 PM  Result Value Ref Range   Specimen Description BLOOD  LEFT HAND    Special Requests      BOTTLES DRAWN AEROBIC AND ANAEROBIC  AER 5CC ANA 5CC   Culture NO GROWTH < 12 HOURS    Report Status PENDING   Troponin I     Status: Abnormal   Collection Time: 11/20/16  9:44 PM  Result Value Ref Range   Troponin I 0.06 (HH) <0.03 ng/mL  Glucose, capillary     Status: Abnormal   Collection Time: 11/20/16 10:27 PM  Result Value Ref Range   Glucose-Capillary 300 (H) 65 - 99 mg/dL  Troponin I     Status: Abnormal   Collection Time: 11/21/16  1:40 AM  Result Value Ref Range   Troponin I 0.06 (HH) <0.03 ng/mL  Basic metabolic panel     Status: Abnormal   Collection Time: 11/21/16  7:11 AM  Result Value Ref Range   Sodium 137 135 - 145 mmol/L   Potassium 3.5 3.5 - 5.1 mmol/L   Chloride 94 (L) 101 - 111 mmol/L   CO2 36 (H) 22  - 32 mmol/L   Glucose, Bld 225 (H) 65 - 99 mg/dL   BUN 15 6 - 20 mg/dL   Creatinine, Ser 1.20 0.61 - 1.24 mg/dL   Calcium 8.5 (L) 8.9 - 10.3 mg/dL   GFR calc non Af Amer >60 >60 mL/min   GFR calc Af Amer >60 >60 mL/min   Anion gap 7 5 - 15  CBC     Status: Abnormal   Collection Time: 11/21/16  7:11 AM  Result Value Ref Range   WBC 10.1 3.8 - 10.6 K/uL   RBC 5.15 4.40 - 5.90 MIL/uL   Hemoglobin 12.8 (L) 13.0 - 18.0 g/dL   HCT 40.4 40.0 - 52.0 %   MCV 78.4 (L) 80.0 - 100.0 fL   MCH 24.9 (L) 26.0 - 34.0 pg   MCHC 31.8 (L) 32.0 - 36.0 g/dL   RDW 17.7 (H) 11.5 - 14.5 %  Platelets 161 150 - 440 K/uL  Troponin I     Status: Abnormal   Collection Time: 11/21/16  7:11 AM  Result Value Ref Range   Troponin I 0.05 (HH) <0.03 ng/mL  Glucose, capillary     Status: Abnormal   Collection Time: 11/21/16  7:33 AM  Result Value Ref Range   Glucose-Capillary 198 (H) 65 - 99 mg/dL  Glucose, capillary     Status: Abnormal   Collection Time: 11/21/16 11:08 AM  Result Value Ref Range   Glucose-Capillary 216 (H) 65 - 99 mg/dL   Dg Chest Port 1 View  Result Date: 11/20/2016 CLINICAL DATA:  Initial evaluation for acute shortness of breath. EXAM: PORTABLE CHEST 1 VIEW COMPARISON:  Prior radiograph from 06/13/2016. FINDINGS: Moderate cardiomegaly, stable. Mediastinal silhouette within normal limits. Lungs mildly hypoinflated. Diffuse pulmonary vascular congestion with indistinctness of the interstitial markings, suggestive of mild diffuse pulmonary interstitial edema. Small left pleural effusion is suspected. No definite focal infiltrates. No pneumothorax. No acute osseous abnormality. IMPRESSION: 1. Cardiomegaly with diffuse pulmonary vascular congestion and indistinctness of the interstitial markings, suggestive of mild diffuse pulmonary interstitial edema. 2. Probable small left pleural effusion. Electronically Signed   By: Jeannine Boga M.D.   On: 11/20/2016 18:44     ASSESSMENT AND  PLAN:Congestive heart failure with history of ejection fraction 40% according to the patient he had negative cardiac catheterization in the past at [facility]. We will repeat his echocardiogram and continue treatment for pneumonia. There is mildly elevated troponin but EKG is unremarkable and chest pain is atypical. Will make further recommendation after reviewing echocardiogram.  KHAN,SHAUKAT A

## 2016-11-21 NOTE — Progress Notes (Addendum)
Inpatient Diabetes Program Recommendations  AACE/ADA: New Consensus Statement on Inpatient Glycemic Control (2015)  Target Ranges:  Prepandial:   less than 140 mg/dL      Peak postprandial:   less than 180 mg/dL (1-2 hours)      Critically ill patients:  140 - 180 mg/dL   Lab Results  Component Value Date   GLUCAP 216 (H) 11/21/2016    Review of Glycemic Control:  Results for JAREL, CUADRA (MRN [id number]) as of 11/21/2016 13:14  Ref. Range 11/20/2016 22:27 11/21/2016 07:33 11/21/2016 11:08  Glucose-Capillary Latest Ref Range: 65 - 99 mg/dL 300 (H) 198 (H) 216 (H)   Diabetes history: Type 2 diabetes Outpatient Diabetes medications: NPH 40 units q HS, Novolin R- 40 units tid with meals Current orders for Inpatient glycemic control:  Levemir 20 units q HS, Novolog 10 units tid with meals, Novolog sensitive tid with meals  Inpatient Diabetes Program Recommendations:   Please consider increasing Levemir to 30 units q HS.  Also please check A1C to determine pre-hospitalization glycemic control. Will follow.  Thanks, Adah Perl, RN, BC-ADM Inpatient Diabetes Coordinator Pager [phone]  (8a-5p)

## 2016-11-21 NOTE — Care Management (Signed)
Patient admitted with congestive heart failure and pneumonia.  Need for 02 is acute.  Discussed the need to assess for home 02 during progression. Patient moved to [place] from [place] a few months ago.  He maintains that she has blue cross blue shield and medicaid and gets his medications from [facility] located in [place] his meds are mailed to him.  Patient provides vague answers regarding the cost of his meds and says he is not going without them.  Does ot have an insurance card for his [place] and does not have his medicaid card in his wallet.  Says he can not go to MD appoints because of lack of transportation.  Provided patient with contact information for medicaid transportation. had an appointment to get established with [facility] 12/5 but came to ED and was admitted.  He has rescheduled his appointment for 12.13.

## 2016-11-21 NOTE — Progress Notes (Signed)
*  PRELIMINARY RESULTS* Echocardiogram 2D Echocardiogram has been performed.  Sherrie Sport 11/21/2016, 3:26 PM

## 2016-11-21 NOTE — Progress Notes (Signed)
[place] at [facility]                                                                                                                                                                                  Patient Demographics   [name], is a 50 y.o. male, DOB - Nov 26, 1966, [id number]  Admit date - 11/20/2016   Admitting Physician Vaughan Basta, MD  Outpatient Primary MD for the patient is Pcp Not In System   LOS - 1  Subjective: She complains of shortness of breath significant amount of swelling. And cough    Review of Systems:   CONSTITUTIONAL: No documented fever. No fatigue, weakness. No weight gain, no weight loss.  EYES: No blurry or double vision.  ENT: No tinnitus. No postnasal drip. No redness of the oropharynx.  RESPIRATORY: Positive cough, no wheeze, no hemoptysis. Positive dyspnea.  CARDIOVASCULAR: No chest pain. No orthopnea. No palpitations. No syncope.  GASTROINTESTINAL: No nausea, no vomiting or diarrhea. No abdominal pain. No melena or hematochezia.  GENITOURINARY: No dysuria or hematuria.  ENDOCRINE: No polyuria or nocturia. No heat or cold intolerance.  HEMATOLOGY: No anemia. No bruising. No bleeding.  INTEGUMENTARY: No rashes. No lesions.  MUSCULOSKELETAL: No arthritis. No swelling. No gout.  NEUROLOGIC: No numbness, tingling, or ataxia. No seizure-type activity.  PSYCHIATRIC: No anxiety. No insomnia. No ADD.    Vitals:   Vitals:   11/21/16 0500 11/21/16 0518 11/21/16 0839 11/21/16 1111  BP:  (!) 145/98 (!) 159/99 (!) 145/91  Pulse:  75 77 77  Resp:   20 20  Temp:   97.6 F (36.4 C) 98.1 F (36.7 C)  TempSrc:   Oral Oral  SpO2:   94% 93%  Weight: 297 lb 8 oz (134.9 kg)     Height:        Wt Readings from Last 3 Encounters:  11/21/16 297 lb 8 oz (134.9 kg)  06/13/16 270 lb (122.5 kg)     Intake/Output Summary (Last 24 hours) at 11/21/16 1527 Last data filed at 11/21/16 1500  Gross per 24 hour   Intake              723 ml  Output             1450 ml  Net             -727 ml    Physical Exam:   GENERAL: Pleasant-appearing in no apparent distress.  HEAD, EYES, EARS, NOSE AND THROAT: Atraumatic, normocephalic. Extraocular muscles are intact. Pupils equal and reactive to light. Sclerae anicteric. No conjunctival injection. No oro-pharyngeal erythema.  NECK: Supple. There is no jugular venous distention.  No bruits, no lymphadenopathy, no thyromegaly.  HEART: Regular rate and rhythm,. No murmurs, no rubs, no clicks.  LUNGS: Bilateral wheezing throughout both lungs No rales or rhonchi. No wheezes.  ABDOMEN: Soft, flat, nontender, nondistended. Has good bowel sounds. No hepatosplenomegaly appreciated.  EXTREMITIES: No evidence of any cyanosis, clubbing, or peripheral edema.  +2 pedal and radial pulses bilaterally.  NEUROLOGIC: The patient is alert, awake, and oriented x3 with no focal motor or sensory deficits appreciated bilaterally.  SKIN: Moist and warm with no rashes appreciated.  Psych: Not anxious, depressed LN: No inguinal LN enlargement    Antibiotics   Anti-infectives    Start     Dose/Rate Route Frequency Ordered Stop   11/21/16 1800  azithromycin (ZITHROMAX) 250 mg in dextrose 5 % 125 mL IVPB  Status:  Discontinued     250 mg 125 mL/hr over 60 Minutes Intravenous Every 24 hours 11/20/16 1922 11/21/16 1447   11/21/16 1800  cefTRIAXone (ROCEPHIN) IVPB 1 g  Status:  Discontinued     1 g 100 mL/hr over 30 Minutes Intravenous Every 24 hours 11/20/16 2141 11/21/16 1446   11/21/16 1500  azithromycin (ZITHROMAX) tablet 250 mg     250 mg Oral Daily 11/21/16 1447     11/20/16 1930  cefTRIAXone (ROCEPHIN) 1 g in dextrose 5 % 50 mL IVPB  Status:  Discontinued     1 g 100 mL/hr over 30 Minutes Intravenous Every 24 hours 11/20/16 1922 11/20/16 2141   11/20/16 1915  cefTRIAXone (ROCEPHIN) IVPB 1 g     1 g 100 mL/hr over 30 Minutes Intravenous  Once 11/20/16 1905 11/20/16 2030    11/20/16 1845  cefTRIAXone (ROCEPHIN) 1 g in dextrose 5 % 50 mL IVPB  Status:  Discontinued     1 g 100 mL/hr over 30 Minutes Intravenous  Once 11/20/16 1840 11/20/16 1905   11/20/16 1845  azithromycin (ZITHROMAX) 500 mg in dextrose 5 % 250 mL IVPB     500 mg 250 mL/hr over 60 Minutes Intravenous  Once 11/20/16 1840 11/20/16 2117      Medications   Scheduled Meds: . allopurinol  200 mg Oral Daily  . aspirin EC  81 mg Oral Daily  . atorvastatin  40 mg Oral QHS  . azithromycin  250 mg Oral Daily  . carvedilol  25 mg Oral Q12H  . furosemide  20 mg Intravenous Q8H  . gabapentin  300 mg Oral TID  . heparin  5,000 Units Subcutaneous Q8H  . hydrALAZINE  50 mg Oral TID  . insulin aspart  0-9 Units Subcutaneous TID WC  . insulin aspart  10 Units Subcutaneous TID WC  . insulin detemir  20 Units Subcutaneous QHS  . ipratropium-albuterol  3 mL Nebulization Q4H  . isosorbide dinitrate  20 mg Oral TID  . pantoprazole  40 mg Oral Daily  . sacubitril-valsartan  1 tablet Oral BID  . sodium chloride flush  3 mL Intravenous Q12H   Continuous Infusions: PRN Meds:.albuterol, methocarbamol, traMADol   Data Review:   Micro Results Recent Results (from the past 240 hour(s))  Blood culture (routine x 2)     Status: None (Preliminary result)   Collection Time: 11/20/16  7:02 PM  Result Value Ref Range Status   Specimen Description BLOOD  RIGHT HAND  Final   Special Requests BOTTLES DRAWN AEROBIC AND ANAEROBIC  AER5CC ANA5CC  Final   Culture NO GROWTH < 12 HOURS  Final   Report Status PENDING  Incomplete  Blood culture (routine x 2)     Status: None (Preliminary result)   Collection Time: 11/20/16  7:02 PM  Result Value Ref Range Status   Specimen Description BLOOD  LEFT HAND  Final   Special Requests   Final    BOTTLES DRAWN AEROBIC AND ANAEROBIC  AER 5CC ANA 5CC   Culture NO GROWTH < 12 HOURS  Final   Report Status PENDING  Incomplete    Radiology Reports Dg Chest Port 1 View  Result  Date: 11/20/2016 CLINICAL DATA:  Initial evaluation for acute shortness of breath. EXAM: PORTABLE CHEST 1 VIEW COMPARISON:  Prior radiograph from 06/13/2016. FINDINGS: Moderate cardiomegaly, stable. Mediastinal silhouette within normal limits. Lungs mildly hypoinflated. Diffuse pulmonary vascular congestion with indistinctness of the interstitial markings, suggestive of mild diffuse pulmonary interstitial edema. Small left pleural effusion is suspected. No definite focal infiltrates. No pneumothorax. No acute osseous abnormality. IMPRESSION: 1. Cardiomegaly with diffuse pulmonary vascular congestion and indistinctness of the interstitial markings, suggestive of mild diffuse pulmonary interstitial edema. 2. Probable small left pleural effusion. Electronically Signed   By: Jeannine Boga M.D.   On: 11/20/2016 18:44     CBC  Recent Labs Lab 11/20/16 1702 11/21/16 0711  WBC 12.4* 10.1  HGB 12.7* 12.8*  HCT 39.9* 40.4  PLT 171 161  MCV 78.4* 78.4*  MCH 25.0* 24.9*  MCHC 31.9* 31.8*  RDW 17.3* 17.7*    Chemistries   Recent Labs Lab 11/20/16 1702 11/21/16 0711  NA 142 137  K 3.6 3.5  CL 101 94*  CO2 35* 36*  GLUCOSE 210* 225*  BUN 12 15  CREATININE 1.21 1.20  CALCIUM 8.8* 8.5*   ------------------------------------------------------------------------------------------------------------------ estimated creatinine clearance is 93.8 mL/min (by C-G formula based on SCr of 1.2 mg/dL). ------------------------------------------------------------------------------------------------------------------ No results for input(s): HGBA1C in the last 72 hours. ------------------------------------------------------------------------------------------------------------------ No results for input(s): CHOL, HDL, LDLCALC, TRIG, CHOLHDL, LDLDIRECT in the last 72 hours. ------------------------------------------------------------------------------------------------------------------ No results for  input(s): TSH, T4TOTAL, T3FREE, THYROIDAB in the last 72 hours.  Invalid input(s): FREET3 ------------------------------------------------------------------------------------------------------------------ No results for input(s): VITAMINB12, FOLATE, FERRITIN, TIBC, IRON, RETICCTPCT in the last 72 hours.  Coagulation profile No results for input(s): INR, PROTIME in the last 168 hours.  No results for input(s): DDIMER in the last 72 hours.  Cardiac Enzymes  Recent Labs Lab 11/20/16 2144 11/21/16 0140 11/21/16 0711  TROPONINI 0.06* 0.06* 0.05*   ------------------------------------------------------------------------------------------------------------------ Invalid input(s): POCBNP    Assessment & Plan  Patient is a 50 year old with known history of systolic CHF   * Acute respiratory failure with hypoxia due to  Acute on chronic congestive heart failure-systolic   Ischemic cardiomyopathy ejection fraction 40%.    continue IV Lasix monitor ins and outs Echocardiogram of the heart is currently pending Appreciate cardiology input Patient's cough is due to acute bronchitis and no pneumonia. IV antibiotics    *Uncontrolled hypertension   Due to noncompliance to medication.   Resume home medication.   Continue therapy with Coreg, started on Ernesto and hydralazine  * Diabetes Continue sliding scale insulin and Levemir as doing at home and increase his dosage  * Hyperlipidemia   Continue statin.  *Sleep apnea  Patient recommended to be compliant with his CPAP machine if possible if he is able to get that at home All the records are reviewed and case discussed with ED provider. Management plans discussed with the patient, family and they are in agreement.  CODE STATUS: Full code    Code Status History  This patient does not have a recorded code status. Please follow your organizational policy for patients in this situation.          Code Status Orders         Start     Ordered   11/20/16 2149  Full code  Continuous     11/20/16 2148    Code Status History    Date Active Date Inactive Code Status Order ID Comments User Context   This patient has a current code status but no historical code status.           Consults cardiology   DVT Prophylaxis  Lovenox   Lab Results  Component Value Date   PLT 161 11/21/2016     Time Spent in minutes   43min  Greater than 50% of time spent in care coordination and counseling patient regarding the condition and plan of care.   Dustin Flock M.D on 11/21/2016 at 3:27 PM  Between 7am to 6pm - Pager - [phone]  After 6pm go to [email or web address] - password [email or web address] [facility]   Office  [phone]

## 2016-11-21 NOTE — Progress Notes (Signed)
Pt BP reported 156/112 at 03:54. Dr. Marcille Blanco made aware. 0.5 inch nitropaste ordered.  BP 145/98 recorded after application. Will continue to monitor.

## 2016-11-21 NOTE — Discharge Instructions (Signed)
Heart Failure Clinic appointment on December 04, 2016 at 8:30am with Darylene Price, [place]. Please call [phone] to reschedule.

## 2016-11-22 ENCOUNTER — Inpatient Hospital Stay: Payer: Medicaid Other

## 2016-11-22 LAB — HEMOGLOBIN A1C
Hgb A1c MFr Bld: 9.8 % — ABNORMAL HIGH (ref 4.8–5.6)
Mean Plasma Glucose: 235 mg/dL

## 2016-11-22 LAB — GLUCOSE, CAPILLARY
Glucose-Capillary: 234 mg/dL — ABNORMAL HIGH (ref 65–99)
Glucose-Capillary: 196 mg/dL — ABNORMAL HIGH (ref 65–99)
Glucose-Capillary: 159 mg/dL — ABNORMAL HIGH (ref 65–99)
Glucose-Capillary: 211 mg/dL — ABNORMAL HIGH (ref 65–99)

## 2016-11-22 LAB — ECHOCARDIOGRAM COMPLETE
Height: 63 in
Weight: 4760 oz

## 2016-11-22 MED ORDER — INSULIN DETEMIR 100 UNIT/ML ~~LOC~~ SOLN
35.0000 [IU] | Freq: Every day | SUBCUTANEOUS | Status: DC
  Administered 2016-11-22: 35 [IU] via SUBCUTANEOUS
  Filled 2016-11-22 (×2): qty 0.35

## 2016-11-22 NOTE — Progress Notes (Signed)
[place] at [facility]                                                                                                                                                                                  Patient Demographics   [name], is a 50 y.o. male, DOB - [birth year], FIE:[id number]  Admit date - 11/20/2016   Admitting Physician Vaughan Basta, MD  Outpatient Primary MD for the patient is Pcp Not In System   LOS - 2  Subjective: Continues to complain of shortness of breath and restless night complains of swelling in both of his lower extremity    Review of Systems:   CONSTITUTIONAL: No documented fever. No fatigue, weakness. No weight gain, no weight loss.  EYES: No blurry or double vision.  ENT: No tinnitus. No postnasal drip. No redness of the oropharynx.  RESPIRATORY: Positive cough, no wheeze, no hemoptysis. Positive dyspnea.  CARDIOVASCULAR: No chest pain. No orthopnea. No palpitations. No syncope.  GASTROINTESTINAL: No nausea, no vomiting or diarrhea. No abdominal pain. No melena or hematochezia.  GENITOURINARY: No dysuria or hematuria.  ENDOCRINE: No polyuria or nocturia. No heat or cold intolerance.  HEMATOLOGY: No anemia. No bruising. No bleeding.  INTEGUMENTARY: No rashes. No lesions.  MUSCULOSKELETAL: No arthritis. Bilateral lower extremity swelling. No gout.  NEUROLOGIC: No numbness, tingling, or ataxia. No seizure-type activity.  PSYCHIATRIC: No anxiety. No insomnia. No ADD.    Vitals:   Vitals:   11/22/16 0430 11/22/16 0500 11/22/16 0800 11/22/16 1121  BP: 131/78  (!) 148/92 128/79  Pulse: 79  83 84  Resp: 16  17 18   Temp: 98 F (36.7 C)  97.6 F (36.4 C) 98.2 F (36.8 C)  TempSrc: Oral  Oral   SpO2: 95%  95% 93%  Weight:  288 lb 12.8 oz (131 kg)    Height:        Wt Readings from Last 3 Encounters:  11/22/16 288 lb 12.8 oz (131 kg)  06/13/16 270 lb (122.5 kg)     Intake/Output Summary (Last 24 hours) at  11/22/16 1503 Last data filed at 11/22/16 1300  Gross per 24 hour  Intake              480 ml  Output             1800 ml  Net            -1320 ml    Physical Exam:   GENERAL: Pleasant-appearing in no apparent distress.  HEAD, EYES, EARS, NOSE AND THROAT: Atraumatic, normocephalic. Extraocular muscles are intact. Pupils equal and reactive to light. Sclerae anicteric. No conjunctival injection. No oro-pharyngeal erythema.  NECK: Supple. There is no jugular venous distention. No bruits, no lymphadenopathy, no thyromegaly.  HEART: Regular rate and rhythm,. No murmurs, no rubs, no clicks.  LUNGS: Crackles laterally no sensory muscle usage.  ABDOMEN: Soft, flat, nontender, nondistended. Has good bowel sounds. No hepatosplenomegaly appreciated.  EXTREMITIES: No evidence of any cyanosis, clubbing, 2+ peripheral edema.  +2 pedal and radial pulses bilaterally.  NEUROLOGIC: The patient is alert, awake, and oriented x3 with no focal motor or sensory deficits appreciated bilaterally.  SKIN: Moist and warm with no rashes appreciated.  Psych: Not anxious, depressed LN: No inguinal LN enlargement    Antibiotics   Anti-infectives    Start     Dose/Rate Route Frequency Ordered Stop   11/21/16 1800  azithromycin (ZITHROMAX) 250 mg in dextrose 5 % 125 mL IVPB  Status:  Discontinued     250 mg 125 mL/hr over 60 Minutes Intravenous Every 24 hours 11/20/16 1922 11/21/16 1447   11/21/16 1800  cefTRIAXone (ROCEPHIN) IVPB 1 g  Status:  Discontinued     1 g 100 mL/hr over 30 Minutes Intravenous Every 24 hours 11/20/16 2141 11/21/16 1446   11/21/16 1500  azithromycin (ZITHROMAX) tablet 250 mg     250 mg Oral Daily 11/21/16 1447 11/25/16 0959   11/20/16 1930  cefTRIAXone (ROCEPHIN) 1 g in dextrose 5 % 50 mL IVPB  Status:  Discontinued     1 g 100 mL/hr over 30 Minutes Intravenous Every 24 hours 11/20/16 1922 11/20/16 2141   11/20/16 1915  cefTRIAXone (ROCEPHIN) IVPB 1 g     1 g 100 mL/hr over 30 Minutes  Intravenous  Once 11/20/16 1905 11/20/16 2030   11/20/16 1845  cefTRIAXone (ROCEPHIN) 1 g in dextrose 5 % 50 mL IVPB  Status:  Discontinued     1 g 100 mL/hr over 30 Minutes Intravenous  Once 11/20/16 1840 11/20/16 1905   11/20/16 1845  azithromycin (ZITHROMAX) 500 mg in dextrose 5 % 250 mL IVPB     500 mg 250 mL/hr over 60 Minutes Intravenous  Once 11/20/16 1840 11/20/16 2117      Medications   Scheduled Meds: . allopurinol  200 mg Oral Daily  . aspirin EC  81 mg Oral Daily  . atorvastatin  40 mg Oral QHS  . azithromycin  250 mg Oral Daily  . carvedilol  25 mg Oral Q12H  . enoxaparin (LOVENOX) injection  40 mg Subcutaneous Q12H  . furosemide  40 mg Intravenous Q12H  . gabapentin  300 mg Oral TID  . hydrALAZINE  50 mg Oral TID  . insulin aspart  0-9 Units Subcutaneous TID WC  . insulin aspart  10 Units Subcutaneous TID WC  . insulin detemir  35 Units Subcutaneous QHS  . isosorbide dinitrate  20 mg Oral TID  . pantoprazole  40 mg Oral Daily  . sacubitril-valsartan  1 tablet Oral BID  . sodium chloride flush  3 mL Intravenous Q12H   Continuous Infusions: PRN Meds:.acetaminophen, albuterol, methocarbamol, traMADol   Data Review:   Micro Results Recent Results (from the past 240 hour(s))  Blood culture (routine x 2)     Status: None (Preliminary result)   Collection Time: 11/20/16  7:02 PM  Result Value Ref Range Status   Specimen Description BLOOD  RIGHT HAND  Final   Special Requests BOTTLES DRAWN AEROBIC AND ANAEROBIC  AER5CC [facility]  Final   Culture NO GROWTH 2 DAYS  Final   Report Status PENDING  Incomplete  Blood culture (  routine x 2)     Status: None (Preliminary result)   Collection Time: 11/20/16  7:02 PM  Result Value Ref Range Status   Specimen Description BLOOD  LEFT HAND  Final   Special Requests   Final    BOTTLES DRAWN AEROBIC AND ANAEROBIC  AER 5CC ANA 5CC   Culture NO GROWTH 2 DAYS  Final   Report Status PENDING  Incomplete    Radiology Reports Dg  Chest 2 View  Result Date: 11/22/2016 CLINICAL DATA:  Congestive heart failure and ischemic cardiomyopathy. EXAM: CHEST  2 VIEW COMPARISON:  November 20, 2016 FINDINGS: Stable cardiomegaly. Mild edema. No other interval changes or acute abnormalities. IMPRESSION: Mild edema. Electronically Signed   By: Dorise Bullion III M.D   On: 11/22/2016 14:57   US Venous Img Lower Bilateral  Result Date: 11/22/2016 CLINICAL DATA:  Shortness of breath. EXAM: BILATERAL LOWER EXTREMITY VENOUS DOPPLER ULTRASOUND TECHNIQUE: Gray-scale sonography with graded compression, as well as color Doppler and duplex ultrasound were performed to evaluate the lower extremity deep venous systems from the level of the common femoral vein and including the common femoral, femoral, profunda femoral, popliteal and calf veins including the posterior tibial, peroneal and gastrocnemius veins when visible. The superficial great saphenous vein was also interrogated. Spectral Doppler was utilized to evaluate flow at rest and with distal augmentation maneuvers in the common femoral, femoral and popliteal veins. COMPARISON:  None. FINDINGS: RIGHT LOWER EXTREMITY Common Femoral Vein: No evidence of thrombus. Normal compressibility, respiratory phasicity and response to augmentation. Saphenofemoral Junction: No evidence of thrombus. Normal compressibility and flow on color Doppler imaging. Profunda Femoral Vein: No evidence of thrombus. Normal compressibility and flow on color Doppler imaging. Femoral Vein: No evidence of thrombus. Normal compressibility, respiratory phasicity and response to augmentation. Popliteal Vein: No evidence of thrombus. Normal compressibility, respiratory phasicity and response to augmentation. Calf Veins: No evidence of thrombus. Normal compressibility and flow on color Doppler imaging. Superficial Great Saphenous Vein: No evidence of thrombus. Normal compressibility and flow on color Doppler imaging. Other Findings:  None.  LEFT LOWER EXTREMITY Common Femoral Vein: No evidence of thrombus. Normal compressibility, respiratory phasicity and response to augmentation. Saphenofemoral Junction: No evidence of thrombus. Normal compressibility and flow on color Doppler imaging. Profunda Femoral Vein: No evidence of thrombus. Normal compressibility and flow on color Doppler imaging. Femoral Vein: No evidence of thrombus. Normal compressibility, respiratory phasicity and response to augmentation. Popliteal Vein: No evidence of thrombus. Normal compressibility, respiratory phasicity and response to augmentation. Calf Veins: No evidence of thrombus. Normal compressibility and flow on color Doppler imaging. Superficial Great Saphenous Vein: No evidence of thrombus. Normal compressibility and flow on color Doppler imaging. Other Findings:  None. IMPRESSION: No evidence of deep venous thrombosis. Electronically Signed   By: Marcello Moores  Register   On: 11/22/2016 14:59   Dg Chest Port 1 View  Result Date: 11/20/2016 CLINICAL DATA:  Initial evaluation for acute shortness of breath. EXAM: PORTABLE CHEST 1 VIEW COMPARISON:  Prior radiograph from 06/13/2016. FINDINGS: Moderate cardiomegaly, stable. Mediastinal silhouette within normal limits. Lungs mildly hypoinflated. Diffuse pulmonary vascular congestion with indistinctness of the interstitial markings, suggestive of mild diffuse pulmonary interstitial edema. Small left pleural effusion is suspected. No definite focal infiltrates. No pneumothorax. No acute osseous abnormality. IMPRESSION: 1. Cardiomegaly with diffuse pulmonary vascular congestion and indistinctness of the interstitial markings, suggestive of mild diffuse pulmonary interstitial edema. 2. Probable small left pleural effusion. Electronically Signed   By: Pincus Badder.D.  On: 11/20/2016 18:44     CBC  Recent Labs Lab 11/20/16 1702 11/21/16 0711  WBC 12.4* 10.1  HGB 12.7* 12.8*  HCT 39.9* 40.4  PLT 171 161  MCV 78.4*  78.4*  MCH 25.0* 24.9*  MCHC 31.9* 31.8*  RDW 17.3* 17.7*    Chemistries   Recent Labs Lab 11/20/16 1702 11/21/16 0711  NA 142 137  K 3.6 3.5  CL 101 94*  CO2 35* 36*  GLUCOSE 210* 225*  BUN 12 15  CREATININE 1.21 1.20  CALCIUM 8.8* 8.5*   ------------------------------------------------------------------------------------------------------------------ estimated creatinine clearance is 92.1 mL/min (by C-G formula based on SCr of 1.2 mg/dL). ------------------------------------------------------------------------------------------------------------------  Recent Labs  11/21/16 0711  HGBA1C 9.8*   ------------------------------------------------------------------------------------------------------------------ No results for input(s): CHOL, HDL, LDLCALC, TRIG, CHOLHDL, LDLDIRECT in the last 72 hours. ------------------------------------------------------------------------------------------------------------------ No results for input(s): TSH, T4TOTAL, T3FREE, THYROIDAB in the last 72 hours.  Invalid input(s): FREET3 ------------------------------------------------------------------------------------------------------------------ No results for input(s): VITAMINB12, FOLATE, FERRITIN, TIBC, IRON, RETICCTPCT in the last 72 hours.  Coagulation profile No results for input(s): INR, PROTIME in the last 168 hours.  No results for input(s): DDIMER in the last 72 hours.  Cardiac Enzymes  Recent Labs Lab 11/20/16 2144 11/21/16 0140 11/21/16 0711  TROPONINI 0.06* 0.06* 0.05*   ------------------------------------------------------------------------------------------------------------------ Invalid input(s): POCBNP    Assessment & Plan  Patient is a 50 year old with known history of systolic CHF   * Acute respiratory failure with hypoxia due to  Acute on chronic congestive heart failure-systolic  Ischemic cardiomyopathy ejection fraction 45 percent continue IV Lasix  monitor ins and outs Appreciate cardiology input Patient's cough is due to acute bronchitis and no pneumonia. Continue oral Levaquin    *Uncontrolled hypertension   Due to noncompliance to medication.    Blood pressure stable  Continue therapy with Coreg, started on Ernesto and hydralazine  * Diabetes Continue sliding scale insulin and Levemir has been adjusted  * Hyperlipidemia   Continue statin.  *Sleep apnea  Patient recommended to be compliant with his CPAP machine if possible if he is able to get that at home We'll try CPAP. Tonight  *bilateral  lower extremity swelling upper of the lower extremity shows no DVT  All the records are reviewed and case discussed with ED provider. Management plans discussed with the patient, family and they are in agreement.  CODE STATUS: Full code    Code Status History    This patient does not have a recorded code status. Please follow your organizational policy for patients in this situation.          Code Status Orders        Start     Ordered   11/20/16 2149  Full code  Continuous     11/20/16 2148    Code Status History    Date Active Date Inactive Code Status Order ID Comments User Context   This patient has a current code status but no historical code status.           Consults cardiology   DVT Prophylaxis  Lovenox   Lab Results  Component Value Date   PLT 161 11/21/2016     Time Spent in minutes   79min  Greater than 50% of time spent in care coordination and counseling patient regarding the condition and plan of care.   Dustin Flock M.D on 11/22/2016 at 3:03 PM  Between 7am to 6pm - Pager - [phone]  After 6pm go to [email or web address] - [place]  [facility]   Office  [phone]

## 2016-11-22 NOTE — Progress Notes (Signed)
Inpatient Diabetes Program Recommendations  AACE/ADA: New Consensus Statement on Inpatient Glycemic Control (2015)  Target Ranges:  Prepandial:   [name] than 140 mg/dL      [place] postprandial:   [name] than 180 mg/dL (1-2 hours)      Critically ill patients:  140 - 180 mg/dL   Lab Results  Component Value Date   GLUCAP 196 (H) 11/22/2016   HGBA1C 9.8 (H) 11/21/2016    Review of Glycemic Control  Results for [name], [name] (MRN [id number]) as of 11/22/2016 13:06  Ref. Range 11/21/2016 11:08 11/21/2016 16:34 11/21/2016 21:39 11/22/2016 07:55 11/22/2016 11:29  Glucose-Capillary Latest Ref Range: 65 - 99 mg/dL 216 (H) 151 (H) 202 (H) 234 (H) 196 (H)    Diabetes history: Type 2 diabetes Outpatient Diabetes medications: NPH 40 units q HS, Novolin R- 40 units tid with meals Current orders for Inpatient glycemic control: Levemir 35 units q HS, Novolog 10 units tid with meals, Novolog sensitive tid with meals  Inpatient Diabetes Program Recommendations:  Note that Levemir was increased to 35 units and will start tonight. Agree with current medications for blood sugar management.   Gentry Fitz, RN, BA, MHA, CDE Diabetes Coordinator Inpatient Diabetes Program  [phone] (Team Pager) [phone] ([place]) 11/22/2016 1:08 PM

## 2016-11-22 NOTE — Care Management (Addendum)
Informed by [facility] [employer] that patient's medicaid is for family planning only. It does not cover medications or anything that is not related to family planning. Contacted patient's [facility] in [place]  [phone]. Until last month, patient had no copay. For some reason, his copay came back as 1638 last month so his prescriptions were not sent.   Called [phone]  [employer] and informed this occurred because patient was in a grace period claim. He had not paid his insurance premium.  Checked with [employer] and informed patient does not have insurance coverage due to non payment.  Patient confirms this and says "I have no income." I am unemployed.  Provided patient with applications for [facility] and [facility]

## 2016-11-22 NOTE — Care Management (Signed)
Found a medicaid number on [employer].  Sent to [employer] to confirm coverage.  [id number] M.

## 2016-11-22 NOTE — Progress Notes (Signed)
SUBJECTIVE: Patient is feeling much better   Vitals:   11/22/16 0421 11/22/16 0430 11/22/16 0500 11/22/16 0800  BP:  131/78  (!) 148/92  Pulse:  79  83  Resp:  16  17  Temp:  98 F (36.7 C)  97.6 F (36.4 C)  TempSrc:  Oral  Oral  SpO2: 93% 95%  95%  Weight:   288 lb 12.8 oz (131 kg)   Height:        Intake/Output Summary (Last 24 hours) at 11/22/16 0905 Last data filed at 11/22/16 0645  Gross per 24 hour  Intake              960 ml  Output             1000 ml  Net              -40 ml    LABS: Basic Metabolic Panel:  Recent Labs  11/20/16 1702 11/21/16 0711  NA 142 137  K 3.6 3.5  CL 101 94*  CO2 35* 36*  GLUCOSE 210* 225*  BUN 12 15  CREATININE 1.21 1.20  CALCIUM 8.8* 8.5*   Liver Function Tests: No results for input(s): AST, ALT, ALKPHOS, BILITOT, PROT, ALBUMIN in the last 72 hours. No results for input(s): LIPASE, AMYLASE in the last 72 hours. CBC:  Recent Labs  11/20/16 1702 11/21/16 0711  WBC 12.4* 10.1  HGB 12.7* 12.8*  HCT 39.9* 40.4  MCV 78.4* 78.4*  PLT 171 161   Cardiac Enzymes:  Recent Labs  11/20/16 2144 11/21/16 0140 11/21/16 0711  TROPONINI 0.06* 0.06* 0.05*   BNP: Invalid input(s): POCBNP D-Dimer: No results for input(s): DDIMER in the last 72 hours. Hemoglobin A1C:  Recent Labs  11/21/16 0711  HGBA1C 9.8*   Fasting Lipid Panel: No results for input(s): CHOL, HDL, LDLCALC, TRIG, CHOLHDL, LDLDIRECT in the last 72 hours. Thyroid Function Tests: No results for input(s): TSH, T4TOTAL, T3FREE, THYROIDAB in the last 72 hours.  Invalid input(s): FREET3 Anemia Panel: No results for input(s): VITAMINB12, FOLATE, FERRITIN, TIBC, IRON, RETICCTPCT in the last 72 hours.   PHYSICAL EXAM General: Well developed, well nourished, in no acute distress HEENT:  Normocephalic and atramatic Neck:  No JVD.  Lungs: Clear bilaterally to auscultation and percussion. Heart: HRRR . Normal S1 and S2 without gallops or murmurs.  Abdomen:  Bowel sounds are positive, abdomen soft and non-tender  Msk:  Back normal, normal gait. Normal strength and tone for age. Extremities: No clubbing, cyanosis or edema.   Neuro: Alert and oriented X 3. Psych:  Good affect, responds appropriately  TELEMETRY:Sinus rhythm  ASSESSMENT AND PLAN: Congestive heart failure with history of ejection fraction being below 40% started on Entresto and is feeling much better. Patient is back in sinus rhythm. Echocardiogram showed ejection fraction 45% but advise continuing Entresto.  Principal Problem:   Acute on chronic systolic CHF (congestive heart failure), NYHA class 3 (HCC) Active Problems:   Acute respiratory failure with hypoxia (HCC)   Acute on chronic systolic CHF (congestive heart failure) (HCC)    KHAN,SHAUKAT A, MD, [facility] 11/22/2016 9:05 AM

## 2016-11-23 ENCOUNTER — Inpatient Hospital Stay: Payer: Medicaid Other

## 2016-11-23 LAB — BASIC METABOLIC PANEL
Anion gap: 6 (ref 5–15)
BUN: 20 mg/dL (ref 6–20)
CO2: 34 mmol/L — ABNORMAL HIGH (ref 22–32)
Calcium: 8.5 mg/dL — ABNORMAL LOW (ref 8.9–10.3)
Chloride: 97 mmol/L — ABNORMAL LOW (ref 101–111)
Creatinine, Ser: 1.33 mg/dL — ABNORMAL HIGH (ref 0.61–1.24)
GFR calc Af Amer: >60 mL/min (ref >60)
GFR calc non Af Amer: >60 mL/min (ref >60)
Glucose, Bld: 296 mg/dL — ABNORMAL HIGH (ref 65–99)
Potassium: 3.6 mmol/L (ref 3.5–5.1)
Sodium: 137 mmol/L (ref 135–145)

## 2016-11-23 LAB — GLUCOSE, CAPILLARY
Glucose-Capillary: 288 mg/dL — ABNORMAL HIGH (ref 65–99)
Glucose-Capillary: 195 mg/dL — ABNORMAL HIGH (ref 65–99)
Glucose-Capillary: 125 mg/dL — ABNORMAL HIGH (ref 65–99)
Glucose-Capillary: 185 mg/dL — ABNORMAL HIGH (ref 65–99)

## 2016-11-23 LAB — PROCALCITONIN: Procalcitonin: <0.1 ng/mL

## 2016-11-23 MED ORDER — GUAIFENESIN 100 MG/5ML PO SOLN: 15.0000 mL | ORAL | Status: DC | PRN

## 2016-11-23 MED ORDER — INSULIN DETEMIR 100 UNIT/ML ~~LOC~~ SOLN
40.0000 [IU] | Freq: Every day | SUBCUTANEOUS | Status: DC
  Administered 2016-11-23: 40 [IU] via SUBCUTANEOUS
  Filled 2016-11-23 (×2): qty 0.4

## 2016-11-23 MED ORDER — LEVOFLOXACIN 500 MG PO TABS
500.0000 mg | ORAL_TABLET | Freq: Every day | ORAL | Status: AC
Start: 2016-11-23 — End: 2016-11-29
  Administered 2016-11-23 – 2016-11-29 (×7): 500 mg via ORAL
  Filled 2016-11-23 (×7): qty 1

## 2016-11-23 MED ORDER — GUAIFENESIN ER 600 MG PO TB12
600.0000 mg | ORAL_TABLET | Freq: Two times a day (BID) | ORAL | Status: DC
  Administered 2016-11-23 – 2016-11-29 (×13): 600 mg via ORAL
  Filled 2016-11-23 (×13): qty 1

## 2016-11-23 NOTE — Progress Notes (Signed)
SUBJECTIVE: Patient is feeling much better but has some cough   Vitals:   11/22/16 1121 11/22/16 1950 11/23/16 0352 11/23/16 0831  BP: 128/79 (!) 150/83 133/87 (!) 144/83  Pulse: 84 89 96 83  Resp: 18 18 18    Temp: 98.2 F (36.8 C) 98.5 F (36.9 C) 98.6 F (37 C) 97.9 F (36.6 C)  TempSrc:  Oral Oral Oral  SpO2: 93% 93% 91% 97%  Weight:   287 lb 3.2 oz (130.3 kg)   Height:        Intake/Output Summary (Last 24 hours) at 11/23/16 0857 Last data filed at 11/23/16 0355  Gross per 24 hour  Intake              480 ml  Output             2250 ml  Net            -1770 ml    LABS: Basic Metabolic Panel:  Recent Labs  11/21/16 0711 11/23/16 0419  NA 137 137  K 3.5 3.6  CL 94* 97*  CO2 36* 34*  GLUCOSE 225* 296*  BUN 15 20  CREATININE 1.20 1.33*  CALCIUM 8.5* 8.5*   Liver Function Tests: No results for input(s): AST, ALT, ALKPHOS, BILITOT, PROT, ALBUMIN in the last 72 hours. No results for input(s): LIPASE, AMYLASE in the last 72 hours. CBC:  Recent Labs  11/20/16 1702 11/21/16 0711  WBC 12.4* 10.1  HGB 12.7* 12.8*  HCT 39.9* 40.4  MCV 78.4* 78.4*  PLT 171 161   Cardiac Enzymes:  Recent Labs  11/20/16 2144 11/21/16 0140 11/21/16 0711  TROPONINI 0.06* 0.06* 0.05*   BNP: Invalid input(s): POCBNP D-Dimer: No results for input(s): DDIMER in the last 72 hours. Hemoglobin A1C:  Recent Labs  11/21/16 0711  HGBA1C 9.8*   Fasting Lipid Panel: No results for input(s): CHOL, HDL, LDLCALC, TRIG, CHOLHDL, LDLDIRECT in the last 72 hours. Thyroid Function Tests: No results for input(s): TSH, T4TOTAL, T3FREE, THYROIDAB in the last 72 hours.  Invalid input(s): FREET3 Anemia Panel: No results for input(s): VITAMINB12, FOLATE, FERRITIN, TIBC, IRON, RETICCTPCT in the last 72 hours.   PHYSICAL EXAM General: Well developed, well nourished, in no acute distress HEENT:  Normocephalic and atramatic Neck:  No JVD.  Lungs: Clear bilaterally to auscultation and  percussion. Heart: HRRR . Normal S1 and S2 without gallops or murmurs.  Abdomen: Bowel sounds are positive, abdomen soft and non-tender  Msk:  Back normal, normal gait. Normal strength and tone for age. Extremities: No clubbing, cyanosis or edema.   Neuro: Alert and oriented X 3. Psych:  Good affect, responds appropriately  TELEMETRY:Sinus rhythm  ASSESSMENT AND PLAN: Congestive heart failure with left ventricle ejection fraction 45% on echocardiogram but had much lower ejection fraction in the past thus advise continuing Entresto. Upon discharge patient can follow-up in the office next Friday at 2 PM.  Principal Problem:   Acute on chronic systolic CHF (congestive heart failure), NYHA class 3 (HCC) Active Problems:   Acute respiratory failure with hypoxia (HCC)   Acute on chronic systolic CHF (congestive heart failure) ([place])    KHAN,SHAUKAT A, MD, [facility] 11/23/2016 8:57 AM

## 2016-11-23 NOTE — Progress Notes (Signed)
SATURATION QUALIFICATIONS: (This note is used to comply with regulatory documentation for home oxygen)  Patient Saturations on Room Air at Rest = 80%  Patient Saturations on Room Air while Ambulating = 0%  Patient Saturations on  Liters of oxygen while Ambulating = 0%  Please briefly explain why patient needs home oxygen:

## 2016-11-23 NOTE — Care Management (Signed)
Have requested of primary the nurse the need to perform home 02 assessment asap so CM can anticipate 02 in this patient that has no payor.  Match referral for his discharge meds as attending is not able to predict discharge medication needs. Provided weekend CM with the letter as unsure of exact discharge date.  Explained the plan to the patient.  Have contacted department director to question possible error code with the referral

## 2016-11-23 NOTE — Progress Notes (Signed)
Pt is not ready to place overnight. He is sitting on side of bed and drink a soda. I explained to pt the importance of trying to sleep for the overnight and the need for more than 4 hrs of study. He asked me to come back later.

## 2016-11-23 NOTE — Progress Notes (Signed)
Spoke with A. Hugelmeyer, DO about pt's oxygen saturation. Pt removed oxygen nasal cannula for approximately 2 minutes and room air oxygen saturation was 85%. Order was given by A. Hugelmeyer, DO to do overnight oximetry on 4L [place].

## 2016-11-23 NOTE — Care Management (Addendum)
Patient has qualified for home 02.  Have contacted [facility] for assessment for charity careand home 02.. Patient has repeated that he has not gotten established with a pcp but that his appointment with [facility] is Wed "or maybe Thursday I don't remember."  Instructed patient on importance of keeping this appointment and he verbalized understanding. Explained the forthcoming [facility] Referral.  If patient does not discharge over the weekend, this will not be needed.  Will be able to use [facility]. [facility] is aware of 02 referral and that order is present in [place].

## 2016-11-23 NOTE — Progress Notes (Signed)
[place] at [facility]                                                                                                                                                                                  Patient Demographics   [name], is a 50 y.o. male, DOB - Oct 31, 1966, SNK:[id number]  Admit date - 11/20/2016   Admitting Physician Vaughan Basta, MD  Outpatient Primary MD for the patient is Pcp Not In System   LOS - 3  Subjective: States that he has significant cough. And so reports that his legs continue to be very swollen.  Review of Systems:   CONSTITUTIONAL: No documented fever. No fatigue, weakness. No weight gain, no weight loss.  EYES: No blurry or double vision.  ENT: No tinnitus. No postnasal drip. No redness of the oropharynx.  RESPIRATORY: Positive cough, no wheeze, no hemoptysis. Positive dyspnea.  CARDIOVASCULAR: No chest pain. No orthopnea. No palpitations. No syncope.  GASTROINTESTINAL: No nausea, no vomiting or diarrhea. No abdominal pain. No melena or hematochezia.  GENITOURINARY: No dysuria or hematuria.  ENDOCRINE: No polyuria or nocturia. No heat or cold intolerance.  HEMATOLOGY: No anemia. No bruising. No bleeding.  INTEGUMENTARY: No rashes. No lesions.  MUSCULOSKELETAL: No arthritis. Bilateral lower extremity swelling. No gout.  NEUROLOGIC: No numbness, tingling, or ataxia. No seizure-type activity.  PSYCHIATRIC: No anxiety. No insomnia. No ADD.    Vitals:   Vitals:   11/22/16 1950 11/23/16 0352 11/23/16 0831 11/23/16 1242  BP: (!) 150/83 133/87 (!) 144/83 112/84  Pulse: 89 96 83 83  Resp: 18 18  20   Temp: 98.5 F (36.9 C) 98.6 F (37 C) 97.9 F (36.6 C) 97.6 F (36.4 C)  TempSrc: Oral Oral Oral Oral  SpO2: 93% 91% 97% 93%  Weight:  287 lb 3.2 oz (130.3 kg)    Height:        Wt Readings from Last 3 Encounters:  11/23/16 287 lb 3.2 oz (130.3 kg)  06/13/16 270 lb (122.5 kg)     Intake/Output Summary  (Last 24 hours) at 11/23/16 1339 Last data filed at 11/23/16 1100  Gross per 24 hour  Intake              600 ml  Output             2300 ml  Net            -1700 ml    Physical Exam:   GENERAL: Pleasant-appearing in no apparent distress.  HEAD, EYES, EARS, NOSE AND THROAT: Atraumatic, normocephalic. Extraocular muscles are intact. Pupils equal and reactive to light. Sclerae anicteric. No conjunctival injection. No oro-pharyngeal erythema.  NECK: Supple. There is no jugular venous distention. No bruits, no lymphadenopathy, no thyromegaly.  HEART: Regular rate and rhythm,. No murmurs, no rubs, no clicks.  LUNGS: Crackles Bilaterally no accesory muscle usage ABDOMEN: Soft, flat, nontender, nondistended. Has good bowel sounds. No hepatosplenomegaly appreciated.  EXTREMITIES: No evidence of any cyanosis, clubbing, 2+ peripheral edema.  +2 pedal and radial pulses bilaterally.  NEUROLOGIC: The patient is alert, awake, and oriented x3 with no focal motor or sensory deficits appreciated bilaterally.  SKIN: Moist and warm with no rashes appreciated.  Psych: Not anxious, depressed LN: No inguinal LN enlargement    Antibiotics   Anti-infectives    Start     Dose/Rate Route Frequency Ordered Stop   11/23/16 1000  levofloxacin (LEVAQUIN) tablet 500 mg     500 mg Oral Daily 11/23/16 0835     11/21/16 1800  azithromycin (ZITHROMAX) 250 mg in dextrose 5 % 125 mL IVPB  Status:  Discontinued     250 mg 125 mL/hr over 60 Minutes Intravenous Every 24 hours 11/20/16 1922 11/21/16 1447   11/21/16 1800  cefTRIAXone (ROCEPHIN) IVPB 1 g  Status:  Discontinued     1 g 100 mL/hr over 30 Minutes Intravenous Every 24 hours 11/20/16 2141 11/21/16 1446   11/21/16 1500  azithromycin (ZITHROMAX) tablet 250 mg  Status:  Discontinued     250 mg Oral Daily 11/21/16 1447 11/23/16 0835   11/20/16 1930  cefTRIAXone (ROCEPHIN) 1 g in dextrose 5 % 50 mL IVPB  Status:  Discontinued     1 g 100 mL/hr over 30 Minutes  Intravenous Every 24 hours 11/20/16 1922 11/20/16 2141   11/20/16 1915  cefTRIAXone (ROCEPHIN) IVPB 1 g     1 g 100 mL/hr over 30 Minutes Intravenous  Once 11/20/16 1905 11/20/16 2030   11/20/16 1845  cefTRIAXone (ROCEPHIN) 1 g in dextrose 5 % 50 mL IVPB  Status:  Discontinued     1 g 100 mL/hr over 30 Minutes Intravenous  Once 11/20/16 1840 11/20/16 1905   11/20/16 1845  azithromycin (ZITHROMAX) 500 mg in dextrose 5 % 250 mL IVPB     500 mg 250 mL/hr over 60 Minutes Intravenous  Once 11/20/16 1840 11/20/16 2117      Medications   Scheduled Meds: . allopurinol  200 mg Oral Daily  . aspirin EC  81 mg Oral Daily  . atorvastatin  40 mg Oral QHS  . carvedilol  25 mg Oral Q12H  . enoxaparin (LOVENOX) injection  40 mg Subcutaneous Q12H  . furosemide  40 mg Intravenous Q12H  . gabapentin  300 mg Oral TID  . guaiFENesin  600 mg Oral BID  . hydrALAZINE  50 mg Oral TID  . insulin aspart  0-9 Units Subcutaneous TID WC  . insulin aspart  10 Units Subcutaneous TID WC  . insulin detemir  40 Units Subcutaneous QHS  . isosorbide dinitrate  20 mg Oral TID  . levofloxacin  500 mg Oral Daily  . pantoprazole  40 mg Oral Daily  . sacubitril-valsartan  1 tablet Oral BID  . sodium chloride flush  3 mL Intravenous Q12H   Continuous Infusions: PRN Meds:.acetaminophen, albuterol, guaiFENesin, methocarbamol, traMADol   Data Review:   Micro Results Recent Results (from the past 240 hour(s))  Blood culture (routine x 2)     Status: None (Preliminary result)   Collection Time: 11/20/16  7:02 PM  Result Value Ref Range Status   Specimen Description BLOOD  RIGHT HAND  Final   Special Requests BOTTLES DRAWN AEROBIC AND ANAEROBIC  AER5CC [facility]  Final   Culture NO GROWTH 3 DAYS  Final   Report Status PENDING  Incomplete  Blood culture (routine x 2)     Status: None (Preliminary result)   Collection Time: 11/20/16  7:02 PM  Result Value Ref Range Status   Specimen Description BLOOD  LEFT HAND  Final    Special Requests   Final    BOTTLES DRAWN AEROBIC AND ANAEROBIC  AER 5CC ANA 5CC   Culture NO GROWTH 3 DAYS  Final   Report Status PENDING  Incomplete    Radiology Reports Dg Chest 2 View  Result Date: 11/22/2016 CLINICAL DATA:  Congestive heart failure and ischemic cardiomyopathy. EXAM: CHEST  2 VIEW COMPARISON:  November 20, 2016 FINDINGS: Stable cardiomegaly. Mild edema. No other interval changes or acute abnormalities. IMPRESSION: Mild edema. Electronically Signed   By: Dorise Bullion III M.D   On: 11/22/2016 14:57   Ct Chest Wo Contrast  Result Date: 11/23/2016 CLINICAL DATA:  Shortness of breath and cough for 1 week. EXAM: CT CHEST WITHOUT CONTRAST TECHNIQUE: Multidetector CT imaging of the chest was performed following the standard protocol without IV contrast. COMPARISON:  Chest x-ray 11/22/2016 FINDINGS: Cardiovascular: Heart is mildly enlarged. Scattered coronary artery calcifications in the right coronary artery, left circumflex and left anterior descending coronary artery. Aorta is normal caliber. Mediastinum/Nodes: Mildly enlarged mediastinal lymph nodes. Index prevascular node has a short axis diameter of 12 mm on image 32. Index AP window lymph node has a short axis diameter of 17 mm on image 40. Subcarinal lymph node has a short axis diameter of 16 mm on image 55. No visible hilar adenopathy on this unenhanced study. No axillary adenopathy. Lungs/Pleura: Scattered mild ground-glass opacities in the lungs. Linear atelectasis in the lung bases. No effusions or confluent airspace opacities. Upper Abdomen: Imaging into the upper abdomen shows no acute findings. Musculoskeletal: Chest wall soft tissues are unremarkable. No acute bony abnormality or focal bone lesion. IMPRESSION: Mild mediastinal adenopathy. While this could be reactive, cannot completely exclude lymphoproliferative adenopathy. Consider follow-up in 3-6 months after acute symptoms resolve. Cardiomegaly. Scattered  ground-glass opacities in the lungs could reflect early edema or mild alveolitis. Coronary artery disease. Electronically Signed   By: Rolm Baptise M.D.   On: 11/23/2016 09:29   US Venous Img Lower Bilateral  Result Date: 11/22/2016 CLINICAL DATA:  Shortness of breath. EXAM: BILATERAL LOWER EXTREMITY VENOUS DOPPLER ULTRASOUND TECHNIQUE: Gray-scale sonography with graded compression, as well as color Doppler and duplex ultrasound were performed to evaluate the lower extremity deep venous systems from the level of the common femoral vein and including the common femoral, femoral, profunda femoral, popliteal and calf veins including the posterior tibial, peroneal and gastrocnemius veins when visible. The superficial great saphenous vein was also interrogated. Spectral Doppler was utilized to evaluate flow at rest and with distal augmentation maneuvers in the common femoral, femoral and popliteal veins. COMPARISON:  None. FINDINGS: RIGHT LOWER EXTREMITY Common Femoral Vein: No evidence of thrombus. Normal compressibility, respiratory phasicity and response to augmentation. Saphenofemoral Junction: No evidence of thrombus. Normal compressibility and flow on color Doppler imaging. Profunda Femoral Vein: No evidence of thrombus. Normal compressibility and flow on color Doppler imaging. Femoral Vein: No evidence of thrombus. Normal compressibility, respiratory phasicity and response to augmentation. Popliteal Vein: No evidence of thrombus. Normal compressibility, respiratory phasicity and response to augmentation. Calf Veins: No evidence of thrombus. Normal compressibility and  flow on color Doppler imaging. Superficial Great Saphenous Vein: No evidence of thrombus. Normal compressibility and flow on color Doppler imaging. Other Findings:  None. LEFT LOWER EXTREMITY Common Femoral Vein: No evidence of thrombus. Normal compressibility, respiratory phasicity and response to augmentation. Saphenofemoral Junction: No  evidence of thrombus. Normal compressibility and flow on color Doppler imaging. Profunda Femoral Vein: No evidence of thrombus. Normal compressibility and flow on color Doppler imaging. Femoral Vein: No evidence of thrombus. Normal compressibility, respiratory phasicity and response to augmentation. Popliteal Vein: No evidence of thrombus. Normal compressibility, respiratory phasicity and response to augmentation. Calf Veins: No evidence of thrombus. Normal compressibility and flow on color Doppler imaging. Superficial Great Saphenous Vein: No evidence of thrombus. Normal compressibility and flow on color Doppler imaging. Other Findings:  None. IMPRESSION: No evidence of deep venous thrombosis. Electronically Signed   By: Marcello Moores  Register   On: 11/22/2016 14:59   Dg Chest Port 1 View  Result Date: 11/20/2016 CLINICAL DATA:  Initial evaluation for acute shortness of breath. EXAM: PORTABLE CHEST 1 VIEW COMPARISON:  Prior radiograph from 06/13/2016. FINDINGS: Moderate cardiomegaly, stable. Mediastinal silhouette within normal limits. Lungs mildly hypoinflated. Diffuse pulmonary vascular congestion with indistinctness of the interstitial markings, suggestive of mild diffuse pulmonary interstitial edema. Small left pleural effusion is suspected. No definite focal infiltrates. No pneumothorax. No acute osseous abnormality. IMPRESSION: 1. Cardiomegaly with diffuse pulmonary vascular congestion and indistinctness of the interstitial markings, suggestive of mild diffuse pulmonary interstitial edema. 2. Probable small left pleural effusion. Electronically Signed   By: Jeannine Boga M.D.   On: 11/20/2016 18:44     CBC  Recent Labs Lab 11/20/16 1702 11/21/16 0711  WBC 12.4* 10.1  HGB 12.7* 12.8*  HCT 39.9* 40.4  PLT 171 161  MCV 78.4* 78.4*  MCH 25.0* 24.9*  MCHC 31.9* 31.8*  RDW 17.3* 17.7*    Chemistries   Recent Labs Lab 11/20/16 1702 11/21/16 0711 11/23/16 0419  NA 142 137 137  K 3.6  3.5 3.6  CL 101 94* 97*  CO2 35* 36* 34*  GLUCOSE 210* 225* 296*  BUN 12 15 20   CREATININE 1.21 1.20 1.33*  CALCIUM 8.8* 8.5* 8.5*   ------------------------------------------------------------------------------------------------------------------ estimated creatinine clearance is 82.9 mL/min (by C-G formula based on SCr of 1.33 mg/dL (H)). ------------------------------------------------------------------------------------------------------------------  Recent Labs  11/21/16 0711  HGBA1C 9.8*   ------------------------------------------------------------------------------------------------------------------ No results for input(s): CHOL, HDL, LDLCALC, TRIG, CHOLHDL, LDLDIRECT in the last 72 hours. ------------------------------------------------------------------------------------------------------------------ No results for input(s): TSH, T4TOTAL, T3FREE, THYROIDAB in the last 72 hours.  Invalid input(s): FREET3 ------------------------------------------------------------------------------------------------------------------ No results for input(s): VITAMINB12, FOLATE, FERRITIN, TIBC, IRON, RETICCTPCT in the last 72 hours.  Coagulation profile No results for input(s): INR, PROTIME in the last 168 hours.  No results for input(s): DDIMER in the last 72 hours.  Cardiac Enzymes  Recent Labs Lab 11/20/16 2144 11/21/16 0140 11/21/16 0711  TROPONINI 0.06* 0.06* 0.05*   ------------------------------------------------------------------------------------------------------------------ Invalid input(s): POCBNP    Assessment & Plan  Patient is a 50 year old with known history of systolic CHF   * Acute respiratory failure with hypoxia due to  Acute on chronic congestive heart failure-systolic  Ischemic cardiomyopathy ejection fraction 45 percent continue IV Lasix monitor ins and outs Continues to be complaint of shortness of breath- I will obtain a noncontrast CT to further  evaluate Patient's cough is due to acute bronchitis and no pneumonia. Continue oral Levaquin His creatinine has increased with IV Lasix however complains of significant swelling of the lower extremity I  will continue IV Lasix and we'll repeat renal function in the morning.   *Uncontrolled hypertension   Due to noncompliance to medication.   Blood pressure improved     Blood pressure stable  Continue therapy with Coreg, hydralazine started on Ernesto and hydralazine  * Diabetes Continue sliding scale insulin and Levemir has been adjustedblood sugars with some improvement  * Hyperlipidemia   Continue statin.  *Sleep apnea  Tried CPAP last night for a few hours he was able to tolerate He's had a sleep study done 2 years ago, I will ask respiratory to do overnight oximetry  *bilateral  lower extremity swelling upper of the lower extremity shows no DVT  All the records are reviewed and case discussed with ED provider. Management plans discussed with the patient, family and they are in agreement.  CODE STATUS: Full code    Code Status History    This patient does not have a recorded code status. Please follow your organizational policy for patients in this situation.          Code Status Orders        Start     Ordered   11/20/16 2149  Full code  Continuous     11/20/16 2148    Code Status History    Date Active Date Inactive Code Status Order ID Comments User Context   This patient has a current code status but no historical code status.           Consults cardiology   DVT Prophylaxis  Lovenox   Lab Results  Component Value Date   PLT 161 11/21/2016     Time Spent in minutes   72min  Greater than 50% of time spent in care coordination and counseling patient regarding the condition and plan of care.   Dustin Flock M.D on 11/23/2016 at 1:39 PM  Between 7am to 6pm - Pager - [phone]  After 6pm go to [email or web address] - password [email or web address]  [facility]   Office  [phone]

## 2016-11-24 LAB — GLUCOSE, CAPILLARY
Glucose-Capillary: 235 mg/dL — ABNORMAL HIGH (ref 65–99)
Glucose-Capillary: 112 mg/dL — ABNORMAL HIGH (ref 65–99)
Glucose-Capillary: 357 mg/dL — ABNORMAL HIGH (ref 65–99)
Glucose-Capillary: 250 mg/dL — ABNORMAL HIGH (ref 65–99)

## 2016-11-24 LAB — BASIC METABOLIC PANEL WITH GFR
Anion gap: 6 (ref 5–15)
BUN: 17 mg/dL (ref 6–20)
CO2: 35 mmol/L — ABNORMAL HIGH (ref 22–32)
Calcium: 8.7 mg/dL — ABNORMAL LOW (ref 8.9–10.3)
Chloride: 95 mmol/L — ABNORMAL LOW (ref 101–111)
Creatinine, Ser: 1.3 mg/dL — ABNORMAL HIGH (ref 0.61–1.24)
GFR calc Af Amer: >60 mL/min
GFR calc non Af Amer: >60 mL/min
Glucose, Bld: 332 mg/dL — ABNORMAL HIGH (ref 65–99)
Potassium: 3.9 mmol/L (ref 3.5–5.1)
Sodium: 136 mmol/L (ref 135–145)

## 2016-11-24 MED ORDER — INSULIN ASPART 100 UNIT/ML ~~LOC~~ SOLN: 5.0000 [IU] | Freq: Three times a day (TID) | SUBCUTANEOUS | Status: DC

## 2016-11-24 MED ORDER — INSULIN DETEMIR 100 UNIT/ML ~~LOC~~ SOLN
35.0000 [IU] | Freq: Every day | SUBCUTANEOUS | Status: DC
  Administered 2016-11-24: 35 [IU] via SUBCUTANEOUS
  Filled 2016-11-24 (×2): qty 0.35

## 2016-11-24 MED ORDER — INSULIN ASPART 100 UNIT/ML ~~LOC~~ SOLN
5.0000 [IU] | Freq: Three times a day (TID) | SUBCUTANEOUS | Status: DC
  Administered 2016-11-24 – 2016-11-26 (×7): 5 [IU] via SUBCUTANEOUS
  Filled 2016-11-24 (×7): qty 5

## 2016-11-24 MED ORDER — CLOBETASOL PROPIONATE 0.05 % EX CREA
TOPICAL_CREAM | Freq: Two times a day (BID) | CUTANEOUS | Status: DC
  Administered 2016-11-24 – 2016-11-29 (×8): via TOPICAL
  Filled 2016-11-24: qty 30
  Filled 2016-11-24 (×3): qty 15

## 2016-11-24 NOTE — Progress Notes (Signed)
PT Cancellation Note  Patient Details Name: [name] MRN: [id number] DOB: [birth year]   Cancelled Treatment:    Reason Eval/Treat Not Completed: Medical issues which prohibited therapy. Consult received. Upon chart review, glucose noted to be 332 with >300 contraindicating movement assessment. Will check at later time if medically appropriate.   Dorice Lamas, PT, DPT 11/24/2016, 2:30 PM

## 2016-11-24 NOTE — Progress Notes (Signed)
Patient ID: [name], male   DOB: 1966-10-11, 50 y.o.   MRN: [id number]  [facility] PROGRESS NOTE  CURREN MOHRMANN IFO:[id number] DOB: 1966/11/14 DOA: 11/20/2016 PCP: Pcp Not In System  HPI/Subjective: Patient feeling short of breath and fatigued. Not feeling well. Patient with headache, cough.  Objective: Vitals:   11/24/16 0854 11/24/16 1147  BP: (!) 167/100 139/77  Pulse: 86 85  Resp:    Temp:  98.4 F (36.9 C)    Filed Weights   11/22/16 0500 11/23/16 0352 11/24/16 0527  Weight: 131 kg (288 lb 12.8 oz) 130.3 kg (287 lb 3.2 oz) 131.4 kg (289 lb 9.6 oz)    ROS: Review of Systems  Constitutional: Negative for chills and fever.  Eyes: Negative for blurred vision.  Respiratory: Positive for cough and shortness of breath.   Cardiovascular: Negative for chest pain.  Gastrointestinal: Negative for abdominal pain, constipation, diarrhea, nausea and vomiting.  Genitourinary: Negative for dysuria.  Musculoskeletal: Negative for joint pain.  Neurological: Positive for headaches. Negative for dizziness.   Exam: Physical Exam  Constitutional: He is oriented to person, place, and time.  HENT:  Nose: No mucosal edema.  Mouth/Throat: No oropharyngeal exudate or posterior oropharyngeal edema.  Eyes: Conjunctivae, EOM and lids are normal. Pupils are equal, round, and reactive to light.  Neck: No JVD present. Carotid bruit is not present. No edema present. No thyroid mass and no thyromegaly present.  Cardiovascular: S1 normal and S2 normal.  Exam reveals no gallop.   No murmur heard. Pulses:      Dorsalis pedis pulses are 2+ on the right side, and 2+ on the left side.  Respiratory: No respiratory distress. He has decreased breath sounds in the right lower field and the left lower field. He has no wheezes. He has no rhonchi. He has no rales.  GI: Soft. Bowel sounds are normal. There is no tenderness.  Musculoskeletal:       Right knee: He exhibits swelling.        Left knee: He exhibits swelling.       Right ankle: He exhibits swelling.       Left ankle: He exhibits swelling.  Lymphadenopathy:    He has no cervical adenopathy.  Neurological: He is alert and oriented to person, place, and time. No cranial nerve deficit.  Skin: Skin is warm. Nails show no clubbing.  Psoriasis all over his body. Right lower extremity size of a dime ulceration with no signs of infection  Psychiatric: He has a normal mood and affect.      Data Reviewed: Basic Metabolic Panel:  Recent Labs Lab 11/20/16 1702 11/21/16 0711 11/23/16 0419 11/24/16 0419  NA 142 137 137 136  K 3.6 3.5 3.6 3.9  CL 101 94* 97* 95*  CO2 35* 36* 34* 35*  GLUCOSE 210* 225* 296* 332*  BUN 12 15 20 17   CREATININE 1.21 1.20 1.33* 1.30*  CALCIUM 8.8* 8.5* 8.5* 8.7*   CBC:  Recent Labs Lab 11/20/16 1702 11/21/16 0711  WBC 12.4* 10.1  HGB 12.7* 12.8*  HCT 39.9* 40.4  MCV 78.4* 78.4*  PLT 171 161   Cardiac Enzymes:  Recent Labs Lab 11/20/16 1702 11/20/16 2144 11/21/16 0140 11/21/16 0711  TROPONINI 0.07* 0.06* 0.06* 0.05*   BNP (last 3 results)  Recent Labs  11/20/16 1702  BNP 111.0*   CBG:  Recent Labs Lab 11/23/16 1216 11/23/16 1633 11/23/16 2052 11/24/16 0747 11/24/16 1150  GLUCAP 195* 125* 185* 235* 112*  Recent Results (from the past 240 hour(s))  Blood culture (routine x 2)     Status: None (Preliminary result)   Collection Time: 11/20/16  7:02 PM  Result Value Ref Range Status   Specimen Description BLOOD  RIGHT HAND  Final   Special Requests BOTTLES DRAWN AEROBIC AND ANAEROBIC  AER5CC [facility]  Final   Culture NO GROWTH 4 DAYS  Final   Report Status PENDING  Incomplete  Blood culture (routine x 2)     Status: None (Preliminary result)   Collection Time: 11/20/16  7:02 PM  Result Value Ref Range Status   Specimen Description BLOOD  LEFT HAND  Final   Special Requests   Final    BOTTLES DRAWN AEROBIC AND ANAEROBIC  AER 5CC ANA 5CC   Culture NO  GROWTH 4 DAYS  Final   Report Status PENDING  Incomplete     Studies: Dg Chest 2 View  Result Date: 11/22/2016 CLINICAL DATA:  Congestive heart failure and ischemic cardiomyopathy. EXAM: CHEST  2 VIEW COMPARISON:  November 20, 2016 FINDINGS: Stable cardiomegaly. Mild edema. No other interval changes or acute abnormalities. IMPRESSION: Mild edema. Electronically Signed   By: Dorise Bullion III M.D   On: 11/22/2016 14:57   Ct Chest Wo Contrast  Result Date: 11/23/2016 CLINICAL DATA:  Shortness of breath and cough for 1 week. EXAM: CT CHEST WITHOUT CONTRAST TECHNIQUE: Multidetector CT imaging of the chest was performed following the standard protocol without IV contrast. COMPARISON:  Chest x-ray 11/22/2016 FINDINGS: Cardiovascular: Heart is mildly enlarged. Scattered coronary artery calcifications in the right coronary artery, left circumflex and left anterior descending coronary artery. Aorta is normal caliber. Mediastinum/Nodes: Mildly enlarged mediastinal lymph nodes. Index prevascular node has a short axis diameter of 12 mm on image 32. Index AP window lymph node has a short axis diameter of 17 mm on image 40. Subcarinal lymph node has a short axis diameter of 16 mm on image 55. No visible hilar adenopathy on this unenhanced study. No axillary adenopathy. Lungs/Pleura: Scattered mild ground-glass opacities in the lungs. Linear atelectasis in the lung bases. No effusions or confluent airspace opacities. Upper Abdomen: Imaging into the upper abdomen shows no acute findings. Musculoskeletal: Chest wall soft tissues are unremarkable. No acute bony abnormality or focal bone lesion. IMPRESSION: Mild mediastinal adenopathy. While this could be reactive, cannot completely exclude lymphoproliferative adenopathy. Consider follow-up in 3-6 months after acute symptoms resolve. Cardiomegaly. Scattered ground-glass opacities in the lungs could reflect early edema or mild alveolitis. Coronary artery disease.  Electronically Signed   By: Rolm Baptise M.D.   On: 11/23/2016 09:29   US Venous Img Lower Bilateral  Result Date: 11/22/2016 CLINICAL DATA:  Shortness of breath. EXAM: BILATERAL LOWER EXTREMITY VENOUS DOPPLER ULTRASOUND TECHNIQUE: Gray-scale sonography with graded compression, as well as color Doppler and duplex ultrasound were performed to evaluate the lower extremity deep venous systems from the level of the common femoral vein and including the common femoral, femoral, profunda femoral, popliteal and calf veins including the posterior tibial, peroneal and gastrocnemius veins when visible. The superficial great saphenous vein was also interrogated. Spectral Doppler was utilized to evaluate flow at rest and with distal augmentation maneuvers in the common femoral, femoral and popliteal veins. COMPARISON:  None. FINDINGS: RIGHT LOWER EXTREMITY Common Femoral Vein: No evidence of thrombus. Normal compressibility, respiratory phasicity and response to augmentation. Saphenofemoral Junction: No evidence of thrombus. Normal compressibility and flow on color Doppler imaging. Profunda Femoral Vein: No evidence of thrombus. Normal  compressibility and flow on color Doppler imaging. Femoral Vein: No evidence of thrombus. Normal compressibility, respiratory phasicity and response to augmentation. Popliteal Vein: No evidence of thrombus. Normal compressibility, respiratory phasicity and response to augmentation. Calf Veins: No evidence of thrombus. Normal compressibility and flow on color Doppler imaging. Superficial Great Saphenous Vein: No evidence of thrombus. Normal compressibility and flow on color Doppler imaging. Other Findings:  None. LEFT LOWER EXTREMITY Common Femoral Vein: No evidence of thrombus. Normal compressibility, respiratory phasicity and response to augmentation. Saphenofemoral Junction: No evidence of thrombus. Normal compressibility and flow on color Doppler imaging. Profunda Femoral Vein: No evidence  of thrombus. Normal compressibility and flow on color Doppler imaging. Femoral Vein: No evidence of thrombus. Normal compressibility, respiratory phasicity and response to augmentation. Popliteal Vein: No evidence of thrombus. Normal compressibility, respiratory phasicity and response to augmentation. Calf Veins: No evidence of thrombus. Normal compressibility and flow on color Doppler imaging. Superficial Great Saphenous Vein: No evidence of thrombus. Normal compressibility and flow on color Doppler imaging. Other Findings:  None. IMPRESSION: No evidence of deep venous thrombosis. Electronically Signed   By: [place]   On: 11/22/2016 14:59    Scheduled Meds: . allopurinol  200 mg Oral Daily  . aspirin EC  81 mg Oral Daily  . atorvastatin  40 mg Oral QHS  . carvedilol  25 mg Oral Q12H  . clobetasol cream   Topical BID  . enoxaparin (LOVENOX) injection  40 mg Subcutaneous Q12H  . furosemide  40 mg Intravenous Q12H  . gabapentin  300 mg Oral TID  . guaiFENesin  600 mg Oral BID  . hydrALAZINE  50 mg Oral TID  . insulin aspart  0-9 Units Subcutaneous TID WC  . insulin aspart  5 Units Subcutaneous TID WC  . insulin detemir  35 Units Subcutaneous QHS  . isosorbide dinitrate  20 mg Oral TID  . levofloxacin  500 mg Oral Daily  . pantoprazole  40 mg Oral Daily  . sacubitril-valsartan  1 tablet Oral BID  . sodium chloride flush  3 mL Intravenous Q12H    Assessment/Plan:  1. Acute respiratory failure with hypoxia. Patient qualifies for oxygen at night and also oxygen during the day. Try to decrease oxygen to 2 L nasal cannula and see what happens. 2. Acute on chronic systolic congestive heart failure. EF has improved to 45%. Continue IV Lasix and entresto. Daily weights on sliding scale. 3. Pneumonia on CT scan. Continue Levaquin 4. Type 2 diabetes mellitus. Last sugar on the lower side cut back a little bit on the detemir insulin at night to 35 units. Drop aspart insulin to 5 units prior  to meals 5. Accelerated hypertension. Blood pressure still variable continue to monitor. 6. Morbid obesity weight loss needed. 7. Obtain a VBG to see if he is retaining carbon dioxide. Patient will need an outpatient sleep study. May benefit from BiPAP at night. 8. Hyperlipidemia unspecified on atorvastatin 9. Psoriasis and right lower extremity wounds. Local wound care. Clobetasol cream.  Code Status:     Code Status Orders        Start     Ordered   11/20/16 2149  Full code  Continuous     11/20/16 2148    Code Status History    Date Active Date Inactive Code Status Order ID Comments User Context   This patient has a current code status but no historical code status.     Disposition Plan: Home once breathing better  Consultants:  Cardiology  Antibiotics:  Levaquin  Time spent: 28 minutes  [place], [place]

## 2016-11-24 NOTE — Progress Notes (Signed)
Pt placed on overnight pulse oximetry with 4L [place].

## 2016-11-25 LAB — CBC
HCT: 43.9 % (ref 40.0–52.0)
Hemoglobin: 13.8 g/dL (ref 13.0–18.0)
MCH: 24.8 pg — ABNORMAL LOW (ref 26.0–34.0)
MCHC: 31.5 g/dL — ABNORMAL LOW (ref 32.0–36.0)
MCV: 78.6 fL — ABNORMAL LOW (ref 80.0–100.0)
Platelets: 218 10*3/uL (ref 150–440)
RBC: 5.59 MIL/uL (ref 4.40–5.90)
RDW: 17.2 % — ABNORMAL HIGH (ref 11.5–14.5)
WBC: 9.7 10*3/uL (ref 3.8–10.6)

## 2016-11-25 LAB — CULTURE, BLOOD (ROUTINE X 2)
Culture: NO GROWTH
Culture: NO GROWTH

## 2016-11-25 LAB — BASIC METABOLIC PANEL
Anion gap: 6 (ref 5–15)
BUN: 16 mg/dL (ref 6–20)
CO2: 36 mmol/L — ABNORMAL HIGH (ref 22–32)
Calcium: 9 mg/dL (ref 8.9–10.3)
Chloride: 95 mmol/L — ABNORMAL LOW (ref 101–111)
Creatinine, Ser: 1.21 mg/dL (ref 0.61–1.24)
GFR calc Af Amer: >60 mL/min (ref >60)
GFR calc non Af Amer: >60 mL/min (ref >60)
Glucose, Bld: 206 mg/dL — ABNORMAL HIGH (ref 65–99)
Potassium: 3.7 mmol/L (ref 3.5–5.1)
Sodium: 137 mmol/L (ref 135–145)

## 2016-11-25 LAB — GLUCOSE, CAPILLARY
Glucose-Capillary: 190 mg/dL — ABNORMAL HIGH (ref 65–99)
Glucose-Capillary: 249 mg/dL — ABNORMAL HIGH (ref 65–99)
Glucose-Capillary: 234 mg/dL — ABNORMAL HIGH (ref 65–99)
Glucose-Capillary: 262 mg/dL — ABNORMAL HIGH (ref 65–99)

## 2016-11-25 LAB — PROCALCITONIN: Procalcitonin: <0.1 ng/mL

## 2016-11-25 MED ORDER — INSULIN DETEMIR 100 UNIT/ML ~~LOC~~ SOLN
38.0000 [IU] | Freq: Every day | SUBCUTANEOUS | Status: DC
  Administered 2016-11-25: 38 [IU] via SUBCUTANEOUS
  Filled 2016-11-25 (×2): qty 0.38

## 2016-11-25 MED ORDER — FUROSEMIDE 10 MG/ML IJ SOLN
60.0000 mg | Freq: Two times a day (BID) | INTRAMUSCULAR | Status: DC
  Administered 2016-11-25 – 2016-11-27 (×4): 60 mg via INTRAVENOUS
  Filled 2016-11-25 (×3): qty 6

## 2016-11-25 MED ORDER — METOLAZONE 5 MG PO TABS
5.0000 mg | ORAL_TABLET | Freq: Once | ORAL | Status: AC
  Administered 2016-11-25: 5 mg via ORAL
  Filled 2016-11-25: qty 1

## 2016-11-25 NOTE — Progress Notes (Signed)
Patient ID: [name], male   DOB: Dec 09, 1966, 50 y.o.   MRN: [id number]  [facility] PROGRESS NOTE  [name] ASN:[id number] DOB: [birth year] DOA: 11/20/2016 PCP: Pcp Not In System  HPI/Subjective: Patient feeling short of breath and fatigued. Not feeling well. Patient did not sleep very well last night. Did not wear the CPAP.  Objective: Vitals:   11/25/16 0815 11/25/16 0900  BP: (!) 157/98 (!) 145/79  Pulse: 83   Resp: 16   Temp: 98 F (36.7 C)     Filed Weights   11/23/16 0352 11/24/16 0527 11/25/16 0418  Weight: 130.3 kg (287 lb 3.2 oz) 131.4 kg (289 lb 9.6 oz) 129.8 kg (286 lb 1.6 oz)    ROS: Review of Systems  Constitutional: Negative for chills and fever.  Eyes: Negative for blurred vision.  Respiratory: Positive for cough and shortness of breath.   Cardiovascular: Negative for chest pain.  Gastrointestinal: Negative for abdominal pain, constipation, diarrhea, nausea and vomiting.  Genitourinary: Negative for dysuria.  Musculoskeletal: Negative for joint pain.  Neurological: Positive for headaches. Negative for dizziness.   Exam: Physical Exam  Constitutional: He is oriented to person, place, and time.  HENT:  Nose: No mucosal edema.  Mouth/Throat: No oropharyngeal exudate or posterior oropharyngeal edema.  Eyes: Conjunctivae, EOM and lids are normal. Pupils are equal, round, and reactive to light.  Neck: No JVD present. Carotid bruit is not present. No edema present. No thyroid mass and no thyromegaly present.  Cardiovascular: S1 normal and S2 normal.  Exam reveals no gallop.   No murmur heard. Pulses:      Dorsalis pedis pulses are 2+ on the right side, and 2+ on the left side.  Respiratory: No respiratory distress. He has decreased breath sounds in the right lower field and the left lower field. He has no wheezes. He has no rhonchi. He has no rales.  GI: Soft. Bowel sounds are normal. There is no tenderness.  Musculoskeletal:       Right  knee: He exhibits swelling.       Left knee: He exhibits swelling.       Right ankle: He exhibits swelling.       Left ankle: He exhibits swelling.  Lymphadenopathy:    He has no cervical adenopathy.  Neurological: He is alert and oriented to person, place, and time. No cranial nerve deficit.  Skin: Skin is warm. Nails show no clubbing.  Psoriasis all over his body. Right lower extremity size of a dime ulceration with no signs of infection  Psychiatric: He has a normal mood and affect.      Data Reviewed: Basic Metabolic Panel:  Recent Labs Lab 11/20/16 1702 11/21/16 0711 11/23/16 0419 11/24/16 0419 11/25/16 0506  NA 142 137 137 136 137  K 3.6 3.5 3.6 3.9 3.7  CL 101 94* 97* 95* 95*  CO2 35* 36* 34* 35* 36*  GLUCOSE 210* 225* 296* 332* 206*  BUN 12 15 20 17 16   CREATININE 1.21 1.20 1.33* 1.30* 1.21  CALCIUM 8.8* 8.5* 8.5* 8.7* 9.0   CBC:  Recent Labs Lab 11/20/16 1702 11/21/16 0711 11/25/16 0741  WBC 12.4* 10.1 9.7  HGB 12.7* 12.8* 13.8  HCT 39.9* 40.4 43.9  MCV 78.4* 78.4* 78.6*  PLT 171 161 218   Cardiac Enzymes:  Recent Labs Lab 11/20/16 1702 11/20/16 2144 11/21/16 0140 11/21/16 0711  TROPONINI 0.07* 0.06* 0.06* 0.05*   BNP (last 3 results)  Recent Labs  11/20/16 1702  BNP 111.0*   CBG:  Recent Labs Lab 11/24/16 1150 11/24/16 1708 11/24/16 2117 11/25/16 0737 11/25/16 1155  GLUCAP 112* 357* 250* 190* 249*    Recent Results (from the past 240 hour(s))  Blood culture (routine x 2)     Status: None   Collection Time: 11/20/16  7:02 PM  Result Value Ref Range Status   Specimen Description BLOOD  RIGHT HAND  Final   Special Requests BOTTLES DRAWN AEROBIC AND ANAEROBIC  AER5CC [facility]  Final   Culture NO GROWTH 5 DAYS  Final   Report Status 11/25/2016 FINAL  Final  Blood culture (routine x 2)     Status: None   Collection Time: 11/20/16  7:02 PM  Result Value Ref Range Status   Specimen Description BLOOD  LEFT HAND  Final   Special  Requests   Final    BOTTLES DRAWN AEROBIC AND ANAEROBIC  AER 5CC ANA 5CC   Culture NO GROWTH 5 DAYS  Final   Report Status 11/25/2016 FINAL  Final     Studies: No results found.  Scheduled Meds: . allopurinol  200 mg Oral Daily  . aspirin EC  81 mg Oral Daily  . atorvastatin  40 mg Oral QHS  . carvedilol  25 mg Oral Q12H  . clobetasol cream   Topical BID  . enoxaparin (LOVENOX) injection  40 mg Subcutaneous Q12H  . furosemide  60 mg Intravenous Q12H  . gabapentin  300 mg Oral TID  . guaiFENesin  600 mg Oral BID  . hydrALAZINE  50 mg Oral TID  . insulin aspart  0-9 Units Subcutaneous TID WC  . insulin aspart  5 Units Subcutaneous TID WC  . insulin detemir  35 Units Subcutaneous QHS  . isosorbide dinitrate  20 mg Oral TID  . levofloxacin  500 mg Oral Daily  . metolazone  5 mg Oral Once  . pantoprazole  40 mg Oral Daily  . sacubitril-valsartan  1 tablet Oral BID  . sodium chloride flush  3 mL Intravenous Q12H    Assessment/Plan:  1. Acute respiratory failure with hypoxia. Patient qualifies for oxygen at night and also oxygen during the day. VBG showed a PCO2 of 73. Patient qualifies for noninvasive ventilation at night. Trial of BiPAP here 2. Acute on chronic systolic congestive heart failure. EF has improved to 45%. Increase IV Lasix to 60 mg twice a day and a dose of Zaroxolyn today. Continue entresto. Daily weights on sliding scale. 3. Pneumonia on CT scan. Continue Levaquin 4. Type 2 diabetes mellitus. Increase detemir insulin at night to 38 units. Drop aspart insulin to 5 units prior to meals 5. Accelerated hypertension. Blood pressure still variable continue to monitor. 6. Morbid obesity weight loss needed. 7. Obtain a VBG to see if he is retaining carbon dioxide. Patient will need an outpatient sleep study. May benefit from BiPAP at night. 8. Hyperlipidemia unspecified on atorvastatin 9. Psoriasis and right lower extremity wounds. Local wound care. Clobetasol  cream.  Code Status:     Code Status Orders        Start     Ordered   11/20/16 2149  Full code  Continuous     11/20/16 2148    Code Status History    Date Active Date Inactive Code Status Order ID Comments User Context   This patient has a current code status but no historical code status.     Disposition Plan: Home once breathing better  Consultants:  Cardiology  Antibiotics:  Levaquin  Time spent: 26 minutes  Loletha Grayer  [employer]

## 2016-11-25 NOTE — Progress Notes (Signed)
Pt states that he doesn't want to wear the CPAP for sleep. Pt also states that the Dr. Rockey Situ him he didn't have to wear it. Order is still active.

## 2016-11-25 NOTE — Progress Notes (Signed)
Physical Therapy Evaluation Patient Details Name: [name] MRN: [id number] DOB: Apr 13, 1966 Today's Date: 11/25/2016   History of Present Illness  Patient is a 50 y.o. male admitted on 05 DEC after not having medications for 3 days and experiencing worsening SOB/coughing. Chest X-Ray detailed pneumonia. 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   Clinical Impression  Patient is a pleasant 50 y.o. Male who lives alone in an apartment and has the above listed comorbidities. On evaluation, patient demonstrates mild lethargy, falling asleep while PT assessed BP which was 145/79 mmHg. He demonstrates independence with bed mobility and transfers. In gait, patient demonstrates lateral weightshifting, leading to slight unsteadiness; however, he does not have any LOB. Patient able to turn and change directions safely. His oxygen saturations remained >90% with ambulation on 2L. Patient will continue to benefit from skilled and progressive PT during his hospital stay to improve his stamina and dynamic balance to prevent falls in the future.  Follow Up Recommendations No PT follow up    Equipment Recommendations  None recommended by PT    Recommendations for Other Services       Precautions / Restrictions Precautions Precautions: None Restrictions Weight Bearing Restrictions: No      Mobility  Bed Mobility Overal bed mobility: Independent                Transfers Overall transfer level: Independent               General transfer comment: Patient moves from sit to stand with good safety awareness.   Ambulation/Gait Ambulation/Gait assistance: Min guard Ambulation Distance (Feet): 15 Feet         General Gait Details: Patient ambulates 72' with CGA, no LOB. O2 saturations remained abaove 90% on 2L. Slight unsteadiness, as he tends to weightshift  laterally instead of sagitally.  Stairs            Wheelchair Mobility    Modified Rankin (Stroke Patients Only)       Balance Overall balance assessment: Independent                                           Pertinent Vitals/Pain Pain Assessment: No/denies pain    Home Living Family/patient expects to be discharged to:: Private residence Living Arrangements: Alone   Type of Home: Apartment Home Access: Stairs to enter   Technical brewer of Steps: 4 Home Layout: One level Home Equipment: None      Prior Function Level of Independence: Independent               Hand Dominance        Extremity/Trunk Assessment   Upper Extremity Assessment: Overall WFL for tasks assessed           Lower Extremity Assessment: Overall WFL for tasks assessed;RLE deficits/detail;LLE deficits/detail RLE Deficits / Details: Edema/psoriasis LLE Deficits / Details: Edema/psoriasis     Communication   Communication: No difficulties  Cognition Arousal/Alertness: Lethargic Behavior During Therapy: WFL for tasks assessed/performed Overall Cognitive Status: Within Functional Limits for tasks assessed                      General Comments      Exercises     Assessment/Plan    PT Assessment Patient needs  continued PT services  PT Problem List Decreased activity tolerance;Decreased balance;Obesity          PT Treatment Interventions Gait training;Functional mobility training;Patient/family education;Balance training;Therapeutic exercise;Therapeutic activities    PT Goals (Current goals can be found in the Care Plan section)  Acute Rehab PT Goals Patient Stated Goal: "To feel better" PT Goal Formulation: With patient Time For Goal Achievement: 12/09/16 Potential to Achieve Goals: Good    Frequency Min 2X/week   Barriers to discharge Decreased caregiver support      Co-evaluation               End of Session  Equipment Utilized During Treatment: Gait belt;Oxygen Activity Tolerance: Patient limited by lethargy Patient left: in chair;with call bell/phone within reach;with chair alarm set           Time: 0936-1000 PT Time Calculation (min) (ACUTE ONLY): 24 min   Charges:   PT Evaluation $PT Eval Low Complexity: 1 Procedure     PT G Codes:        Dorice Lamas, PT, DPT 11/25/2016, 10:06 AM

## 2016-11-26 LAB — BASIC METABOLIC PANEL
Anion gap: 5 (ref 5–15)
BUN: 22 mg/dL — ABNORMAL HIGH (ref 6–20)
CO2: 40 mmol/L — ABNORMAL HIGH (ref 22–32)
Calcium: 9.3 mg/dL (ref 8.9–10.3)
Chloride: 89 mmol/L — ABNORMAL LOW (ref 101–111)
Creatinine, Ser: 1.39 mg/dL — ABNORMAL HIGH (ref 0.61–1.24)
GFR calc Af Amer: >60 mL/min (ref >60)
GFR calc non Af Amer: 59 mL/min — ABNORMAL LOW (ref >60)
Glucose, Bld: 347 mg/dL — ABNORMAL HIGH (ref 65–99)
Potassium: 3.5 mmol/L (ref 3.5–5.1)
Sodium: 134 mmol/L — ABNORMAL LOW (ref 135–145)

## 2016-11-26 LAB — BLOOD GAS, VENOUS
Acid-Base Excess: 15.3 mmol/L — ABNORMAL HIGH (ref 0.0–2.0)
Bicarbonate: 44.2 mmol/L — ABNORMAL HIGH (ref 20.0–28.0)
O2 Saturation: 74.4 %
Patient temperature: 37
pCO2, Ven: 73 mmHg (ref 44.0–60.0)
pH, Ven: 7.39 (ref 7.250–7.430)
pO2, Ven: 40 mmHg (ref 32.0–45.0)

## 2016-11-26 LAB — GLUCOSE, CAPILLARY
Glucose-Capillary: 283 mg/dL — ABNORMAL HIGH (ref 65–99)
Glucose-Capillary: 239 mg/dL — ABNORMAL HIGH (ref 65–99)
Glucose-Capillary: 325 mg/dL — ABNORMAL HIGH (ref 65–99)
Glucose-Capillary: 340 mg/dL — ABNORMAL HIGH (ref 65–99)

## 2016-11-26 MED ORDER — BUDESONIDE 0.25 MG/2ML IN SUSP
0.2500 mg | Freq: Two times a day (BID) | RESPIRATORY_TRACT | Status: DC
  Administered 2016-11-26 – 2016-11-29 (×6): 0.25 mg via RESPIRATORY_TRACT
  Filled 2016-11-26 (×6): qty 2

## 2016-11-26 MED ORDER — SODIUM CHLORIDE 0.9 % IV BOLUS (SEPSIS): 500.0000 mL | Freq: Once | INTRAVENOUS | Status: DC

## 2016-11-26 MED ORDER — HYDRALAZINE HCL 25 MG PO TABS
25.0000 mg | ORAL_TABLET | Freq: Three times a day (TID) | ORAL | Status: DC
  Administered 2016-11-26 – 2016-11-29 (×9): 25 mg via ORAL
  Filled 2016-11-26 (×9): qty 1

## 2016-11-26 MED ORDER — INSULIN ASPART 100 UNIT/ML ~~LOC~~ SOLN
7.0000 [IU] | Freq: Three times a day (TID) | SUBCUTANEOUS | Status: DC
  Administered 2016-11-26 – 2016-11-27 (×3): 7 [IU] via SUBCUTANEOUS
  Filled 2016-11-26 (×3): qty 7

## 2016-11-26 MED ORDER — OXYCODONE HCL 5 MG PO TABS
5.0000 mg | ORAL_TABLET | ORAL | Status: DC | PRN
  Administered 2016-11-26 – 2016-11-29 (×6): 5 mg via ORAL
  Filled 2016-11-26 (×6): qty 1

## 2016-11-26 MED ORDER — INSULIN DETEMIR 100 UNIT/ML ~~LOC~~ SOLN
45.0000 [IU] | Freq: Every day | SUBCUTANEOUS | Status: DC
  Administered 2016-11-26: 45 [IU] via SUBCUTANEOUS
  Filled 2016-11-26 (×2): qty 0.45

## 2016-11-26 MED ORDER — HYDROCOD POLST-CPM POLST ER 10-8 MG/5ML PO SUER
5.0000 mL | Freq: Two times a day (BID) | ORAL | Status: DC
  Administered 2016-11-26 – 2016-11-29 (×7): 5 mL via ORAL
  Filled 2016-11-26 (×7): qty 5

## 2016-11-26 MED ORDER — GUAIFENESIN-DM 100-10 MG/5ML PO SYRP: 5.0000 mL | ORAL_SOLUTION | ORAL | Status: DC | PRN

## 2016-11-26 NOTE — Progress Notes (Signed)
Patient found in bathroom by physical therapy feeling diaphoretic and weak. Primary nurse notified and staff was able to get patient in chair and back in bed. BP reading 85/67 and oxygen saturation at 88% on room air. Dr. Leslye Peer notified and MD stated to give a 500cc bolus and EKG. Pressure rechecked and resulted at 125/85. Dr. Leslye Peer is coming to the unit to see patient, will reevaluate giving bolus when MD arrives now that BP has come up. Patient is starting to feel better at this time.

## 2016-11-26 NOTE — Care Management Note (Signed)
Case Management Note  Patient Details  Name: [name] MRN: [id number] Date of Birth: Oct 06, 1966  Subjective/Objective:     Discussed possibility of charity NIV for uninsured [name] with Floydene Flock at [facility]. Corene Cornea reports that NIV is not covered under Medicaid and will therefore not be eligible for [name] either.                Action/Plan:   Expected Discharge Date:                  Expected Discharge Plan:     In-House Referral:     Discharge planning Services     Post Acute Care Choice:    Choice offered to:     DME Arranged:    DME Agency:     HH Arranged:    HH Agency:     Status of Service:     If discussed at [employer] of Stay Meetings, dates discussed:    Additional Comments:  Rockett,Marilyn A, RN 11/26/2016, 8:19 AM

## 2016-11-26 NOTE — Progress Notes (Signed)
Patient ID: [name], male   DOB: 1966/10/10, 50 y.o.   MRN: [id number]  [facility] PROGRESS NOTE  [name] HFW:[id number] DOB: 08-04-1966 DOA: 11/20/2016 PCP: Pcp Not In System  HPI/Subjective: Patient seen this morning and again after rapid response. Patient this morning was coughing and short of breath and having pain secondary to the cough. He has the nurse later for some pain medication. He had to get to the bathroom quickly because he thought he was can have a bowel movement and he went without his oxygen. He was found sitting on the toilet sweaty and less responsive. Pulse ox was 88% on room air. Blood pressure was found to be low and he was brought back into the bed.  Patient feeling a little bit  better with repeat blood pressure check.  Objective: Vitals:   11/26/16 1217 11/26/16 1220  BP: (!) 85/67 125/85  Pulse:    Resp:    Temp:      Filed Weights   11/24/16 0527 11/25/16 0418 11/26/16 0500  Weight: 131.4 kg (289 lb 9.6 oz) 129.8 kg (286 lb 1.6 oz) 126.9 kg (279 lb 11.2 oz)    ROS: Review of Systems  Constitutional: Negative for chills and fever.  Eyes: Negative for blurred vision.  Respiratory: Positive for cough and shortness of breath.   Cardiovascular: Negative for chest pain.  Gastrointestinal: Negative for abdominal pain, constipation, diarrhea, nausea and vomiting.  Genitourinary: Negative for dysuria.  Musculoskeletal: Negative for joint pain.  Neurological: Positive for headaches. Negative for dizziness.   Exam: Physical Exam  Constitutional: He is oriented to person, place, and time.  HENT:  Nose: No mucosal edema.  Mouth/Throat: No oropharyngeal exudate or posterior oropharyngeal edema.  Eyes: Conjunctivae, EOM and lids are normal. Pupils are equal, round, and reactive to light.  Neck: No JVD present. Carotid bruit is not present. No edema present. No thyroid mass and no thyromegaly present.  Cardiovascular: S1 normal and S2  normal.  Exam reveals no gallop.   No murmur heard. Pulses:      Dorsalis pedis pulses are 2+ on the right side, and 2+ on the left side.  Respiratory: No respiratory distress. He has decreased breath sounds in the right lower field and the left lower field. He has no wheezes. He has no rhonchi. He has no rales.  GI: Soft. Bowel sounds are normal. There is no tenderness.  Musculoskeletal:       Right knee: He exhibits swelling.       Left knee: He exhibits swelling.       Right ankle: He exhibits swelling.       Left ankle: He exhibits swelling.  Lymphadenopathy:    He has no cervical adenopathy.  Neurological: He is alert and oriented to person, place, and time. No cranial nerve deficit.  Skin: Skin is warm. Nails show no clubbing.  Psoriasis all over his body. Right lower extremity size of a dime ulceration with no signs of infection  Psychiatric: He has a normal mood and affect.      Data Reviewed: Basic Metabolic Panel:  Recent Labs Lab 11/21/16 0711 11/23/16 0419 11/24/16 0419 11/25/16 0506 11/26/16 0430  NA 137 137 136 137 134*  K 3.5 3.6 3.9 3.7 3.5  CL 94* 97* 95* 95* 89*  CO2 36* 34* 35* 36* 40*  GLUCOSE 225* 296* 332* 206* 347*  BUN 15 20 17 16  22*  CREATININE 1.20 1.33* 1.30* 1.21 1.39*  CALCIUM 8.5*  8.5* 8.7* 9.0 9.3   CBC:  Recent Labs Lab 11/20/16 1702 11/21/16 0711 11/25/16 0741  WBC 12.4* 10.1 9.7  HGB 12.7* 12.8* 13.8  HCT 39.9* 40.4 43.9  MCV 78.4* 78.4* 78.6*  PLT 171 161 218   Cardiac Enzymes:  Recent Labs Lab 11/20/16 1702 11/20/16 2144 11/21/16 0140 11/21/16 0711  TROPONINI 0.07* 0.06* 0.06* 0.05*   BNP (last 3 results)  Recent Labs  11/20/16 1702  BNP 111.0*   CBG:  Recent Labs Lab 11/25/16 1155 11/25/16 1708 11/25/16 2116 11/26/16 0754 11/26/16 1133  GLUCAP 249* 234* 262* 283* 239*    Recent Results (from the past 240 hour(s))  Blood culture (routine x 2)     Status: None   Collection Time: 11/20/16  7:02 PM   Result Value Ref Range Status   Specimen Description BLOOD  RIGHT HAND  Final   Special Requests BOTTLES DRAWN AEROBIC AND ANAEROBIC  AER5CC [facility]  Final   Culture NO GROWTH 5 DAYS  Final   Report Status 11/25/2016 FINAL  Final  Blood culture (routine x 2)     Status: None   Collection Time: 11/20/16  7:02 PM  Result Value Ref Range Status   Specimen Description BLOOD  LEFT HAND  Final   Special Requests   Final    BOTTLES DRAWN AEROBIC AND ANAEROBIC  AER 5CC ANA 5CC   Culture NO GROWTH 5 DAYS  Final   Report Status 11/25/2016 FINAL  Final      Scheduled Meds: . allopurinol  200 mg Oral Daily  . aspirin EC  81 mg Oral Daily  . atorvastatin  40 mg Oral QHS  . budesonide (PULMICORT) nebulizer solution  0.25 mg Nebulization BID  . carvedilol  25 mg Oral Q12H  . chlorpheniramine-HYDROcodone  5 mL Oral Q12H  . clobetasol cream   Topical BID  . enoxaparin (LOVENOX) injection  40 mg Subcutaneous Q12H  . furosemide  60 mg Intravenous Q12H  . gabapentin  300 mg Oral TID  . guaiFENesin  600 mg Oral BID  . hydrALAZINE  50 mg Oral TID  . insulin aspart  0-9 Units Subcutaneous TID WC  . insulin aspart  5 Units Subcutaneous TID WC  . insulin detemir  38 Units Subcutaneous QHS  . isosorbide dinitrate  20 mg Oral TID  . levofloxacin  500 mg Oral Daily  . pantoprazole  40 mg Oral Daily  . sacubitril-valsartan  1 tablet Oral BID  . sodium chloride  500 mL Intravenous Once  . sodium chloride flush  3 mL Intravenous Q12H    Assessment/Plan:  1. Acute respiratory failure with hypoxia. Patient qualifies for oxygen at night and also oxygen during the day. VBG showed a PCO2 of 73. Patient qualifies for noninvasive ventilation at night. Trial of BiPAP here. Case discussed with care manager to try to set this up but the patient may not have insurance after the 31st and then likely will not receive this service at home.  2. Vasovagal syncope. Check orthostatic vital signs. Since blood pressure did  respond hold off on fluid bolus at this point. May need to cut back on blood pressure medications if he is orthostatic later in the day.  3. Acute on chronic systolic congestive heart failure. EF has improved to 45%. keep  IV Lasix to 60 mg twice a day. Continue entresto. Daily weights on sliding scale. 4. Pneumonia on CT scan. Continue Levaquin 5. Type 2 diabetes mellitus. Increase detemir insulin at  night to 38 units. Drop aspart insulin to 5 units prior to meals 6. Essential hypertension. Blood pressure low after vasovagal event. Cut back on hydralazine. Check orthostatic vital signs 7. Morbid obesity weight loss needed. 8. Hyperlipidemia unspecified on atorvastatin 9. Psoriasis and right lower extremity wounds. Local wound care. Clobetasol cream.  Code Status:     Code Status Orders        Start     Ordered   11/20/16 2149  Full code  Continuous     11/20/16 2148    Code Status History    Date Active Date Inactive Code Status Order ID Comments User Context   This patient has a current code status but no historical code status.     Disposition Plan: Home once breathing better  Consultants:  Cardiology  Antibiotics:  Levaquin  Time spent: 35 minutes, earlier and now.   Loletha Grayer  [employer]

## 2016-11-26 NOTE — Progress Notes (Signed)
PT Cancellation Note  Patient Details Name: [name] MRN: [id number] DOB: Jan 04, 1966   Cancelled Treatment:    Reason Eval/Treat Not Completed: Medical issues which prohibited therapy   Walking past pt room this am and heard him moan for help.  Pt in bathroom without O2.  Pt diaphoretic and stated he felt weak.  Primary nurse called. BP low.  Assisted with transfer to recliner and assisted pt back to bed.  Rapid response called by nursing and pt left in their care.  Will hold session today.   Chesley Noon 11/26/2016, 1:54 PM

## 2016-11-26 NOTE — Care Management (Signed)
Rapid Response to 238, arrived and Care Team had stabilized patient.

## 2016-11-26 NOTE — Significant Event (Signed)
Rapid Response Event Note  Overview: Time Called: 1229 Arrival Time: 1230 Event Type: Hypotension  Initial Focused Assessment: RRT RN arrived and patient being helped from chair to bed with PT and NT, with patient's RN present in room. Patient in sinus arrhythmia, O2 saturation in 90s on 3 L nasal cannula, BP in bed 125/85 MAP 94. Patient was alert and oriented. Per report from patient and patient's RN patient was in bathroom and felt dizzy, BP checked and was 85/67 MAP 86. No pain including chest pain reported by patient. Lungs clear but diminished. Patient did report that he sometimes feels that way at home when he goes to the bathroom.   Interventions: Helped pull patient up in bed so patient was able to sit up straight.   Plan of Care (if not transferred): Patient to receive 12 lead EKG per MD due to sinus arrhythmia on monitor, no boluses due to stabilized BP in bed, patient to get orthostatic vital signs. MD to assess changes to BP medications and stated he would adjust. RNs and NT discussed use of bedside commode for rest of day if patient needed to have bowel movement or void with patient. RN and patient informed to call if patient cannot maintain blood pressure or is symptomatic again.  Event Summary: Name of Physician Notified: Dr. Leslye Peer at 1229    at    Outcome: Stayed in room and stabalized  Event End Time: [place], [place]

## 2016-11-26 NOTE — Progress Notes (Signed)
Inpatient Diabetes Program Recommendations  AACE/ADA: New Consensus Statement on Inpatient Glycemic Control (2015)  Target Ranges:  Prepandial:   less than 140 mg/dL      Peak postprandial:   less than 180 mg/dL (1-2 hours)      Critically ill patients:  140 - 180 mg/dL   Results for [name], [name] (MRN [id number]) as of 11/26/2016 10:34  Ref. Range 11/25/2016 07:37 11/25/2016 11:55 11/25/2016 17:08 11/25/2016 21:16  Glucose-Capillary Latest Ref Range: 65 - 99 mg/dL 190 (H) 249 (H) 234 (H) 262 (H)   Results for [name], [name] (MRN [id number]) as of 11/26/2016 10:34  Ref. Range 11/26/2016 07:54  Glucose-Capillary Latest Ref Range: 65 - 99 mg/dL 283 (H)    Home DM Meds: NPH Insulin 40 units QHS       Regular 40 units TID  Current Insulin Orders: Levemir 38 units QHS      Novolog Sensitive Correction Scale/ SSI (0-9 units) TID AC      Novolog 5 units TID with meals       MD- Please consider the following in-hospital insulin adjustments:  1. Increase Levemir to 45 units QHS  2. Increase Novolog Meal Coverage to: Novolog 7 units TID with meals (hold if pt eats <50% of meal)       --Will follow patient during hospitalization--  Wyn Quaker RN, MSN, CDE Diabetes Coordinator Inpatient Glycemic Control Team Team Pager: [phone] (8a-5p)

## 2016-11-26 NOTE — Progress Notes (Signed)
SUBJECTIVE: Patient still having cough and shortness of breath   Vitals:   11/25/16 1733 11/25/16 1941 11/26/16 0442 11/26/16 0500  BP: (!) 165/95 (!) 154/94 (!) 145/88   Pulse: 85 86 86   Resp:  15 18   Temp:  98.1 F (36.7 C) 98.6 F (37 C)   TempSrc:  Oral Oral   SpO2: 94% 96% 91%   Weight:    279 lb 11.2 oz (126.9 kg)  Height:        Intake/Output Summary (Last 24 hours) at 11/26/16 0838 Last data filed at 11/26/16 0815  Gross per 24 hour  Intake             1080 ml  Output             3475 ml  Net            -2395 ml    LABS: Basic Metabolic Panel:  Recent Labs  11/25/16 0506 11/26/16 0430  NA 137 134*  K 3.7 3.5  CL 95* 89*  CO2 36* 40*  GLUCOSE 206* 347*  BUN 16 22*  CREATININE 1.21 1.39*  CALCIUM 9.0 9.3   Liver Function Tests: No results for input(s): AST, ALT, ALKPHOS, BILITOT, PROT, ALBUMIN in the last 72 hours. No results for input(s): LIPASE, AMYLASE in the last 72 hours. CBC:  Recent Labs  11/25/16 0741  WBC 9.7  HGB 13.8  HCT 43.9  MCV 78.6*  PLT 218   Cardiac Enzymes: No results for input(s): CKTOTAL, CKMB, CKMBINDEX, TROPONINI in the last 72 hours. BNP: Invalid input(s): POCBNP D-Dimer: No results for input(s): DDIMER in the last 72 hours. Hemoglobin A1C: No results for input(s): HGBA1C in the last 72 hours. Fasting Lipid Panel: No results for input(s): CHOL, HDL, LDLCALC, TRIG, CHOLHDL, LDLDIRECT in the last 72 hours. Thyroid Function Tests: No results for input(s): TSH, T4TOTAL, T3FREE, THYROIDAB in the last 72 hours.  Invalid input(s): FREET3 Anemia Panel: No results for input(s): VITAMINB12, FOLATE, FERRITIN, TIBC, IRON, RETICCTPCT in the last 72 hours.   PHYSICAL EXAM General: Well developed, well nourished, in no acute distress HEENT:  Normocephalic and atramatic Neck:  No JVD.  Lungs: Clear bilaterally to auscultation and percussion. Heart: HRRR . Normal S1 and S2 without gallops or murmurs.  Abdomen: Bowel sounds  are positive, abdomen soft and non-tender  Msk:  Back normal, normal gait. Normal strength and tone for age. Extremities: No clubbing, cyanosis or edema.   Neuro: Alert and oriented X 3. Psych:  Good affect, responds appropriately  TELEMETRY: Sinus rhythm  ASSESSMENT AND PLAN: Congestive heart failure with history of coronary artery disease and LVEF of 45%. Added Aldactone yesterday and his potassium is normal. We will evaluate the patient after adding Aldactone.  Principal Problem:   Acute on chronic systolic CHF (congestive heart failure), NYHA class 3 (HCC) Active Problems:   Acute respiratory failure with hypoxia (HCC)   Acute on chronic systolic CHF (congestive heart failure) (HCC)    KHAN,SHAUKAT A, MD, [facility] 11/26/2016 8:38 AM

## 2016-11-26 NOTE — Progress Notes (Signed)
Dr. Leslye Peer on unit and stated to hold off on saline bolus.

## 2016-11-27 LAB — PROCALCITONIN: Procalcitonin: <0.1 ng/mL

## 2016-11-27 LAB — GLUCOSE, CAPILLARY
Glucose-Capillary: 239 mg/dL — ABNORMAL HIGH (ref 65–99)
Glucose-Capillary: 274 mg/dL — ABNORMAL HIGH (ref 65–99)
Glucose-Capillary: 332 mg/dL — ABNORMAL HIGH (ref 65–99)
Glucose-Capillary: 297 mg/dL — ABNORMAL HIGH (ref 65–99)

## 2016-11-27 MED ORDER — INSULIN DETEMIR 100 UNIT/ML ~~LOC~~ SOLN
50.0000 [IU] | Freq: Every day | SUBCUTANEOUS | Status: DC
  Administered 2016-11-27: 50 [IU] via SUBCUTANEOUS
  Filled 2016-11-27 (×2): qty 0.5

## 2016-11-27 MED ORDER — INSULIN ASPART 100 UNIT/ML ~~LOC~~ SOLN
12.0000 [IU] | Freq: Three times a day (TID) | SUBCUTANEOUS | Status: DC
  Administered 2016-11-27 – 2016-11-28 (×3): 12 [IU] via SUBCUTANEOUS
  Filled 2016-11-27 (×3): qty 12

## 2016-11-27 MED ORDER — METOLAZONE 5 MG PO TABS
5.0000 mg | ORAL_TABLET | Freq: Once | ORAL | Status: AC
  Administered 2016-11-27: 5 mg via ORAL
  Filled 2016-11-27 (×2): qty 1

## 2016-11-27 MED ORDER — AMMONIUM LACTATE 12 % EX LOTN
TOPICAL_LOTION | CUTANEOUS | Status: DC | PRN
  Administered 2016-11-27 – 2016-11-28 (×3): via TOPICAL
  Filled 2016-11-27: qty 400

## 2016-11-27 MED ORDER — FUROSEMIDE 10 MG/ML IJ SOLN
80.0000 mg | Freq: Two times a day (BID) | INTRAMUSCULAR | Status: DC
  Administered 2016-11-27 – 2016-11-28 (×2): 80 mg via INTRAVENOUS
  Filled 2016-11-27 (×2): qty 8

## 2016-11-27 NOTE — Care Management (Signed)
Patient had overnight oximetry performed 12/8 and destatted a total of 2.7 hours.  Notified [employer]. Contacted Careers information officer about initiating a Insurance underwriter for patient

## 2016-11-27 NOTE — Progress Notes (Signed)
SUBJECTIVE: Patient apparently fell down this morning   Vitals:   11/27/16 0347 11/27/16 0348 11/27/16 0448 11/27/16 0813  BP: 103/62 100/63    Pulse: 85 89    Resp:      Temp:      TempSrc:      SpO2: (!) 76% (!) 86%  95%  Weight:   289 lb 9.6 oz (131.4 kg)   Height:        Intake/Output Summary (Last 24 hours) at 11/27/16 0834 Last data filed at 11/27/16 0700  Gross per 24 hour  Intake              940 ml  Output             2350 ml  Net            -1410 ml    LABS: Basic Metabolic Panel:  Recent Labs  11/25/16 0506 11/26/16 0430  NA 137 134*  K 3.7 3.5  CL 95* 89*  CO2 36* 40*  GLUCOSE 206* 347*  BUN 16 22*  CREATININE 1.21 1.39*  CALCIUM 9.0 9.3   Liver Function Tests: No results for input(s): AST, ALT, ALKPHOS, BILITOT, PROT, ALBUMIN in the last 72 hours. No results for input(s): LIPASE, AMYLASE in the last 72 hours. CBC:  Recent Labs  11/25/16 0741  WBC 9.7  HGB 13.8  HCT 43.9  MCV 78.6*  PLT 218   Cardiac Enzymes: No results for input(s): CKTOTAL, CKMB, CKMBINDEX, TROPONINI in the last 72 hours. BNP: Invalid input(s): POCBNP D-Dimer: No results for input(s): DDIMER in the last 72 hours. Hemoglobin A1C: No results for input(s): HGBA1C in the last 72 hours. Fasting Lipid Panel: No results for input(s): CHOL, HDL, LDLCALC, TRIG, CHOLHDL, LDLDIRECT in the last 72 hours. Thyroid Function Tests: No results for input(s): TSH, T4TOTAL, T3FREE, THYROIDAB in the last 72 hours.  Invalid input(s): FREET3 Anemia Panel: No results for input(s): VITAMINB12, FOLATE, FERRITIN, TIBC, IRON, RETICCTPCT in the last 72 hours.   PHYSICAL EXAM General: Well developed, well nourished, in no acute distress HEENT:  Normocephalic and atramatic Neck:  No JVD.  Lungs: Clear bilaterally to auscultation and percussion. Heart: HRRR . Normal S1 and S2 without gallops or murmurs.  Abdomen: Bowel sounds are positive, abdomen soft and non-tender  Msk:  Back normal,  normal gait. Normal strength and tone for age. Extremities: No clubbing, cyanosis or edema.   Neuro: Alert and oriented X 3. Psych:  Good affect, responds appropriately  TELEMETRY: Sinus rhythm  ASSESSMENT AND PLAN: Presyncope with history of ejection fraction 45% and congestive heart failure. Patient is right now eating breakfast is comfortable. He may have had a vasovagal episode.  Principal Problem:   Acute on chronic systolic CHF (congestive heart failure), NYHA class 3 (HCC) Active Problems:   Acute respiratory failure with hypoxia (HCC)   Acute on chronic systolic CHF (congestive heart failure) ([place])    KHAN,SHAUKAT A, MD, [facility] 11/27/2016 8:34 AM    2

## 2016-11-27 NOTE — Progress Notes (Addendum)
Inpatient Diabetes Program Recommendations  AACE/ADA: New Consensus Statement on Inpatient Glycemic Control (2015)  Target Ranges:  Prepandial:   less than 140 mg/dL      Peak postprandial:   less than 180 mg/dL (1-2 hours)      Critically ill patients:  140 - 180 mg/dL  Results for RITCHARD, PARAGAS (MRN [id number]) as of 11/27/2016 11:24  Ref. Range 11/26/2016 07:54 11/26/2016 11:33 11/26/2016 16:49 11/26/2016 21:04 11/27/2016 07:47  Glucose-Capillary Latest Ref Range: 65 - 99 mg/dL 283 (H) 239 (H) 325 (H) 340 (H) 239 (H)   Results for DANTRELL, SCHERTZER (MRN [id number]) as of 11/27/2016 11:24  Ref. Range 11/21/2016 07:11  Hemoglobin A1C Latest Ref Range: 4.8 - 5.6 % 9.8 (H)   Review of Glycemic Control  Diabetes history: DM2 Outpatient Diabetes medications: NPH 40 units QHS, Regular 40 units TID with meals Current orders for Inpatient glycemic control: Levemir 45 units QHS, Novolog 7 units TID with meals, Novolog 0-9 units TID with meals  Inpatient Diabetes Program Recommendations Insulin - Basal: Over the past 24 hours glucose has ranged from 239-340 mg/dl and fasting glucose is 239 mg/dl this morning. Please consider increasing Levemir to 50 units QHS. Correction (SSI): Please consider adding Novolog bedtime correction scale. Insulin - Meal Coverage: Please consider increasing meal coverage to Novolog 12 units TID with meals. HgbA1C: A1C 9.8% on 11/21/16 indicating an average glucose of 235 mg/dl over the past 2-3 months. Recommend patient follow up with PCP regarding improving DM control.  Addendum 11/27/16@14 :54- Spoke with patient about diabetes and home regimen for diabetes control. Patient reports that he lost his insurance about 1 month ago and that he ran out of insulin 3 days prior to coming to the hospital. Patient states that he has no income coming in and he can not afford to see his PCP. Patient reports that he has an appointment at [facility] for  11/28/16 at 2:40 pm and that he was referred there from the [facility]. Patient was taking NPH 40 units QHS and Regular 40 units TID with meals as an outpatient for diabetes control up until he ran out of insulin 3 days ago. Informed patient he could purchase NOVOLIN NPH and NOVOLIN Regular at [facility] for $25 per vial and patient states that he can not afford to get any insulin because he has no money coming in. Inquired about [facility] and noted that patient had applications for [facility] and [facility] at his bedside. Encouraged patient to fill out application so that once he is discharged he can take his prescriptions and the application to [facility] and see if he can get assistance with DM medications. Patient states that he checks his glucose 2-3 times per week and that it has been in the upper 100's mg/dl (180-190's) in the morning and goes up in the 200-300's mg/dl in the evening.  Discussed A1C results (9.8% on 11/21/16) and explained that his current A1C indicates an average glucose of 235 mg/dl over the past 2-3 months. Discussed glucose and A1C goals. Discussed importance of checking CBGs and maintaining good CBG control to prevent long-term and short-term complications. Explained how hyperglycemia leads to damage within blood vessels which lead to the common complications seen with uncontrolled diabetes. Stressed to the patient the importance of improving glycemic control to prevent further complications from uncontrolled diabetes. Discussed impact of nutrition, exercise, stress, sickness, and medications on diabetes control. Encouraged patient to check his  glucose more often, ideally 4 times per day (before meals and at bedtime) and to keep a log book of glucose readings and insulin taken which he will need to take to doctor appointments. Explained how the doctor he follows up with can use the log book to continue to make insulin  adjustments if needed. Again asked patient to fill out application for [facility]. Patient verbalized understanding of information discussed and he states that he has no further questions at this time related to diabetes.   Thanks, Barnie Alderman, RN, MSN, CDE Diabetes Coordinator Inpatient Diabetes Program [phone] (Team Pager from 8am to 5pm)

## 2016-11-27 NOTE — Care Management (Signed)
Spoke with [employer] representative regarding policy benefits as this CM was informed last week by [employer] representative that the policy was not active due to non payment of premium.  Representative says that patient "is in a grace period." Acknowledges that the premium has not been paid. Requested overnight oximetry order from attending

## 2016-11-27 NOTE — Progress Notes (Signed)
Patient ID: [name], male   DOB: 1966/07/03, 50 y.o.   MRN: [id number]  [facility] PROGRESS NOTE  [name] YPP:[id number] DOB: [birth year] DOA: 11/20/2016 PCP: Pcp Not In System  HPI/Subjective: Patient seen this morning and again after rapid response. Patient this morning was coughing and short of breath and having pain secondary to the cough. He has the nurse later for some pain medication. He had to get to the bathroom quickly because he thought he was can have a bowel movement and he went without his oxygen. He was found sitting on the toilet sweaty and less responsive. Pulse ox was 88% on room air. Blood pressure was found to be low and he was brought back into the bed.  Patient feeling a little bit  better with repeat blood pressure check.  Objective: Vitals:   11/27/16 1139 11/27/16 1323  BP: 128/73 127/72  Pulse: 87 77  Resp: 20 20  Temp: 97.6 F (36.4 C) 98.3 F (36.8 C)    Filed Weights   11/25/16 0418 11/26/16 0500 11/27/16 0448  Weight: 129.8 kg (286 lb 1.6 oz) 126.9 kg (279 lb 11.2 oz) 131.4 kg (289 lb 9.6 oz)    ROS: Review of Systems  Constitutional: Negative for chills and fever.  Eyes: Negative for blurred vision.  Respiratory: Positive for cough and shortness of breath.   Cardiovascular: Negative for chest pain.  Gastrointestinal: Negative for abdominal pain, constipation, diarrhea, nausea and vomiting.  Genitourinary: Negative for dysuria.  Musculoskeletal: Negative for joint pain.  Neurological: Positive for headaches. Negative for dizziness.   Exam: Physical Exam  Constitutional: He is oriented to person, place, and time.  HENT:  Nose: No mucosal edema.  Mouth/Throat: No oropharyngeal exudate or posterior oropharyngeal edema.  Eyes: Conjunctivae, EOM and lids are normal. Pupils are equal, round, and reactive to light.  Neck: No JVD present. Carotid bruit is not present. No edema present. No thyroid mass and no thyromegaly present.   Cardiovascular: S1 normal and S2 normal.  Exam reveals no gallop.   No murmur heard. Pulses:      Dorsalis pedis pulses are 2+ on the right side, and 2+ on the left side.  Respiratory: No respiratory distress. He has decreased breath sounds in the right lower field and the left lower field. He has no wheezes. He has no rhonchi. He has no rales.  GI: Soft. Bowel sounds are normal. There is no tenderness.  Musculoskeletal:       Right knee: He exhibits swelling.       Left knee: He exhibits swelling.       Right ankle: He exhibits swelling.       Left ankle: He exhibits swelling.  Lymphadenopathy:    He has no cervical adenopathy.  Neurological: He is alert and oriented to person, place, and time. No cranial nerve deficit.  Skin: Skin is warm. Nails show no clubbing.  Psoriasis all over his body. Right lower extremity size of a dime ulceration with no signs of infection  Psychiatric: He has a normal mood and affect.      Data Reviewed: Basic Metabolic Panel:  Recent Labs Lab 11/21/16 0711 11/23/16 0419 11/24/16 0419 11/25/16 0506 11/26/16 0430  NA 137 137 136 137 134*  K 3.5 3.6 3.9 3.7 3.5  CL 94* 97* 95* 95* 89*  CO2 36* 34* 35* 36* 40*  GLUCOSE 225* 296* 332* 206* 347*  BUN 15 20 17 16  22*  CREATININE 1.20 1.33* 1.30*  1.21 1.39*  CALCIUM 8.5* 8.5* 8.7* 9.0 9.3   CBC:  Recent Labs Lab 11/20/16 1702 11/21/16 0711 11/25/16 0741  WBC 12.4* 10.1 9.7  HGB 12.7* 12.8* 13.8  HCT 39.9* 40.4 43.9  MCV 78.4* 78.4* 78.6*  PLT 171 161 218   Cardiac Enzymes:  Recent Labs Lab 11/20/16 1702 11/20/16 2144 11/21/16 0140 11/21/16 0711  TROPONINI 0.07* 0.06* 0.06* 0.05*   BNP (last 3 results)  Recent Labs  11/20/16 1702  BNP 111.0*   CBG:  Recent Labs Lab 11/26/16 1133 11/26/16 1649 11/26/16 2104 11/27/16 0747 11/27/16 1302  GLUCAP 239* 325* 340* 239* 274*    Recent Results (from the past 240 hour(s))  Blood culture (routine x 2)     Status: None    Collection Time: 11/20/16  7:02 PM  Result Value Ref Range Status   Specimen Description BLOOD  RIGHT HAND  Final   Special Requests BOTTLES DRAWN AEROBIC AND ANAEROBIC  AER5[facility]  Final   Culture NO GROWTH 5 DAYS  Final   Report Status 11/25/2016 FINAL  Final  Blood culture (routine x 2)     Status: None   Collection Time: 11/20/16  7:02 PM  Result Value Ref Range Status   Specimen Description BLOOD  LEFT HAND  Final   Special Requests   Final    BOTTLES DRAWN AEROBIC AND ANAEROBIC  AER 5CC ANA 5CC   Culture NO GROWTH 5 DAYS  Final   Report Status 11/25/2016 FINAL  Final      Scheduled Meds: . allopurinol  200 mg Oral Daily  . aspirin EC  81 mg Oral Daily  . atorvastatin  40 mg Oral QHS  . budesonide (PULMICORT) nebulizer solution  0.25 mg Nebulization BID  . carvedilol  25 mg Oral Q12H  . chlorpheniramine-HYDROcodone  5 mL Oral Q12H  . clobetasol cream   Topical BID  . enoxaparin (LOVENOX) injection  40 mg Subcutaneous Q12H  . furosemide  80 mg Intravenous Q12H  . gabapentin  300 mg Oral TID  . guaiFENesin  600 mg Oral BID  . hydrALAZINE  25 mg Oral TID  . insulin aspart  0-9 Units Subcutaneous TID WC  . insulin aspart  7 Units Subcutaneous TID WC  . insulin detemir  45 Units Subcutaneous QHS  . isosorbide dinitrate  20 mg Oral TID  . levofloxacin  500 mg Oral Daily  . pantoprazole  40 mg Oral Daily  . sacubitril-valsartan  1 tablet Oral BID  . sodium chloride  500 mL Intravenous Once  . sodium chloride flush  3 mL Intravenous Q12H    Assessment/Plan:  1. Acute respiratory failure with hypoxia. Patient qualifies for oxygen at night and also oxygen during the day. VBG showed a PCO2 of 73. Patient qualifies for noninvasive ventilation at night. I don't think I'll be able to get this qualified for him though because his insurance is probably given a lab soon. 2. Vasovagal syncope yesterday 3. Acute on chronic systolic congestive heart failure. EF has improved to 45%.  Increase  IV Lasix to 80 mg twice a day and give a dose of Zaroxolyn. Continue entresto. Daily weights on sliding scale. 4. Pneumonia on CT scan. Continue Levaquin 5. Type 2 diabetes mellitus. Increase detemir insulin at night to 50 units. NovoLog 12 units 3 times a day with meals 6. Essential hypertension. Blood pressure stable today 7. Morbid obesity weight loss needed. 8. Hyperlipidemia unspecified on atorvastatin 9. Psoriasis and right lower extremity  wounds. Local wound care. Clobetasol cream. 10. Fungal infection of feet. Lac-Hydrin lotion first then antifungal cream as outpatient  Code Status:     Code Status Orders        Start     Ordered   11/20/16 2149  Full code  Continuous     11/20/16 2148    Code Status History    Date Active Date Inactive Code Status Order ID Comments User Context   This patient has a current code status but no historical code status.     Disposition Plan: Home Potentially in the next day or so  Consultants:  Cardiology  Antibiotics:  Levaquin  Time spent: 25 minutes.   [place], [facility]          Patient ID: [name], male   DOB: 11-13-1966, 50 y.o.   MRN: [id number]

## 2016-11-27 NOTE — Progress Notes (Signed)
SATURATION QUALIFICATIONS: (This note is used to comply with regulatory documentation for home oxygen) 12.11.2017 12:39p  Patient Saturations on Room Air at Rest 88%

## 2016-11-28 LAB — GLUCOSE, CAPILLARY
Glucose-Capillary: 338 mg/dL — ABNORMAL HIGH (ref 65–99)
Glucose-Capillary: 360 mg/dL — ABNORMAL HIGH (ref 65–99)
Glucose-Capillary: 252 mg/dL — ABNORMAL HIGH (ref 65–99)
Glucose-Capillary: 302 mg/dL — ABNORMAL HIGH (ref 65–99)

## 2016-11-28 LAB — BASIC METABOLIC PANEL
Anion gap: 9 (ref 5–15)
BUN: 44 mg/dL — ABNORMAL HIGH (ref 6–20)
CO2: 39 mmol/L — ABNORMAL HIGH (ref 22–32)
Calcium: 9.5 mg/dL (ref 8.9–10.3)
Chloride: 84 mmol/L — ABNORMAL LOW (ref 101–111)
Creatinine, Ser: 1.9 mg/dL — ABNORMAL HIGH (ref 0.61–1.24)
GFR calc Af Amer: 47 mL/min — ABNORMAL LOW (ref >60)
GFR calc non Af Amer: 40 mL/min — ABNORMAL LOW (ref >60)
Glucose, Bld: 300 mg/dL — ABNORMAL HIGH (ref 65–99)
Potassium: 3.7 mmol/L (ref 3.5–5.1)
Sodium: 132 mmol/L — ABNORMAL LOW (ref 135–145)

## 2016-11-28 LAB — CBC
HCT: 48.4 % (ref 40.0–52.0)
Hemoglobin: 15.5 g/dL (ref 13.0–18.0)
MCH: 24.9 pg — ABNORMAL LOW (ref 26.0–34.0)
MCHC: 32 g/dL (ref 32.0–36.0)
MCV: 77.6 fL — ABNORMAL LOW (ref 80.0–100.0)
Platelets: 241 10*3/uL (ref 150–440)
RBC: 6.23 MIL/uL — ABNORMAL HIGH (ref 4.40–5.90)
RDW: 17.1 % — ABNORMAL HIGH (ref 11.5–14.5)
WBC: 7.8 10*3/uL (ref 3.8–10.6)

## 2016-11-28 MED ORDER — INSULIN ASPART 100 UNIT/ML ~~LOC~~ SOLN: 0.0000 [IU] | Freq: Three times a day (TID) | SUBCUTANEOUS | Status: DC

## 2016-11-28 MED ORDER — INSULIN ASPART 100 UNIT/ML ~~LOC~~ SOLN
0.0000 [IU] | Freq: Every day | SUBCUTANEOUS | Status: DC
  Administered 2016-11-28: 4 [IU] via SUBCUTANEOUS
  Filled 2016-11-28: qty 4

## 2016-11-28 MED ORDER — INSULIN ASPART 100 UNIT/ML ~~LOC~~ SOLN
15.0000 [IU] | Freq: Three times a day (TID) | SUBCUTANEOUS | Status: DC
  Administered 2016-11-28 – 2016-11-29 (×3): 15 [IU] via SUBCUTANEOUS
  Filled 2016-11-28 (×3): qty 15

## 2016-11-28 MED ORDER — INSULIN DETEMIR 100 UNIT/ML ~~LOC~~ SOLN
57.0000 [IU] | Freq: Every day | SUBCUTANEOUS | Status: DC
  Administered 2016-11-28: 57 [IU] via SUBCUTANEOUS
  Filled 2016-11-28 (×2): qty 0.57

## 2016-11-28 NOTE — Progress Notes (Signed)
Inpatient Diabetes Program Recommendations  AACE/ADA: New Consensus Statement on Inpatient Glycemic Control (2015)  Target Ranges:  Prepandial:   less than 140 mg/dL      Peak postprandial:   less than 180 mg/dL (1-2 hours)      Critically ill patients:  140 - 180 mg/dL   Results for [name], [name] (MRN [id number]) as of 11/28/2016 09:04  Ref. Range 11/27/2016 07:47 11/27/2016 13:02 11/27/2016 16:52 11/27/2016 20:22 11/28/2016 07:42  Glucose-Capillary Latest Ref Range: 65 - 99 mg/dL 239 (H) 274 (H) 332 (H) 297 (H) 338 (H)   Review of Glycemic Control  Diabetes history: DM2 Outpatient Diabetes medications: NPH 40 units QHS, Regular 40 units TID with meals (no insulin in 3 days prior to admission because he ran out of insulin) Current orders for Inpatient glycemic control: Levemir 45 units QHS, Novolog 7 units TID with meals, Novolog 0-9 units TID with meals  Inpatient Diabetes Program Recommendations: Insulin - Basal: Please consider increasing Levemir to 57 units QHS (based on 126.9 kg x 0.45 units). Correction (SSI): Bedtime glucose 298 mg/dl last night but no insulin given since no Novolog correction ordered for bedtime. Please order Novolog bedtime correction scale. Insulin - Meal Coverage: Please consider increasing meal coverage to Novolog 18 units TID with meals. HgbA1C: A1C 9.8% on 11/21/16 indicating an average glucose of 235 mg/dl over the past 2-3 months. Recommend patient follow up with PCP regarding improving DM control.  Thanks, Barnie Alderman, RN, MSN, CDE Diabetes Coordinator Inpatient Diabetes Program [phone] (Team Pager from 8am to 5pm)

## 2016-11-28 NOTE — Progress Notes (Signed)
SUBJECTIVE: Patient is having some tightness in the chest and some shortness of breath   Vitals:   11/27/16 1759 11/27/16 1955 11/27/16 2114 11/28/16 0348  BP: 122/73 120/71  120/76  Pulse: 81 87  85  Resp:  18  18  Temp:  98.4 F (36.9 C)  98.4 F (36.9 C)  TempSrc:  Oral  Oral  SpO2:  97% 94% 95%  Weight:    279 lb 11.2 oz (126.9 kg)  Height:        Intake/Output Summary (Last 24 hours) at 11/28/16 0917 Last data filed at 11/28/16 0847  Gross per 24 hour  Intake             1080 ml  Output             2550 ml  Net            -1470 ml    LABS: Basic Metabolic Panel:  Recent Labs  11/26/16 0430 11/28/16 0423  NA 134* 132*  K 3.5 3.7  CL 89* 84*  CO2 40* 39*  GLUCOSE 347* 300*  BUN 22* 44*  CREATININE 1.39* 1.90*  CALCIUM 9.3 9.5   Liver Function Tests: No results for input(s): AST, ALT, ALKPHOS, BILITOT, PROT, ALBUMIN in the last 72 hours. No results for input(s): LIPASE, AMYLASE in the last 72 hours. CBC:  Recent Labs  11/28/16 0423  WBC 7.8  HGB 15.5  HCT 48.4  MCV 77.6*  PLT 241   Cardiac Enzymes: No results for input(s): CKTOTAL, CKMB, CKMBINDEX, TROPONINI in the last 72 hours. BNP: Invalid input(s): POCBNP D-Dimer: No results for input(s): DDIMER in the last 72 hours. Hemoglobin A1C: No results for input(s): HGBA1C in the last 72 hours. Fasting Lipid Panel: No results for input(s): CHOL, HDL, LDLCALC, TRIG, CHOLHDL, LDLDIRECT in the last 72 hours. Thyroid Function Tests: No results for input(s): TSH, T4TOTAL, T3FREE, THYROIDAB in the last 72 hours.  Invalid input(s): FREET3 Anemia Panel: No results for input(s): VITAMINB12, FOLATE, FERRITIN, TIBC, IRON, RETICCTPCT in the last 72 hours.   PHYSICAL EXAM General: Well developed, well nourished, in no acute distress HEENT:  Normocephalic and atramatic Neck:  No JVD.  Lungs: Clear bilaterally to auscultation and percussion. Heart: HRRR . Normal S1 and S2 without gallops or murmurs.   Abdomen: Bowel sounds are positive, abdomen soft and non-tender  Msk:  Back normal, normal gait. Normal strength and tone for age. Extremities: No clubbing, cyanosis or edema.   Neuro: Alert and oriented X 3. Psych:  Good affect, responds appropriately  TELEMETRY:Sinus rhythm  ASSESSMENT AND PLAN: Congestive heart failure and pneumonia with left ventricle ejection fraction 45% and history of coronary artery disease. Chest pain appears to be atypical we'll do outpatient Lexiscan Myoview.  Principal Problem:   Acute on chronic systolic CHF (congestive heart failure), NYHA class 3 (HCC) Active Problems:   Acute respiratory failure with hypoxia (HCC)   Acute on chronic systolic CHF (congestive heart failure) (HCC)    KHAN,SHAUKAT A, MD, [facility] 11/28/2016 9:17 AM

## 2016-11-28 NOTE — Progress Notes (Signed)
Patient ID: [name], male   DOB: [birth year], 50 y.o.   MRN: [id number]   [facility] PROGRESS NOTE  [name] PXT:[id number] DOB: [birth year] DOA: 11/20/2016 PCP: Pcp Not In System  HPI/Subjective: Patient breathing a little bit better today. Does have a little chest tightness especially when coughing.  Objective: Vitals:   11/28/16 0348 11/28/16 1140  BP: 120/76 120/68  Pulse: 85 83  Resp: 18 14  Temp: 98.4 F (36.9 C) 97.7 F (36.5 C)    Filed Weights   11/26/16 0500 11/27/16 0448 11/28/16 0348  Weight: 126.9 kg (279 lb 11.2 oz) 131.4 kg (289 lb 9.6 oz) 126.9 kg (279 lb 11.2 oz)    ROS: Review of Systems  Constitutional: Negative for chills and fever.  Eyes: Negative for blurred vision.  Respiratory: Positive for cough and shortness of breath.   Cardiovascular: Positive for chest pain.  Gastrointestinal: Negative for abdominal pain, constipation, diarrhea, nausea and vomiting.  Genitourinary: Negative for dysuria.  Musculoskeletal: Negative for joint pain.  Neurological: Positive for headaches. Negative for dizziness.   Exam: Physical Exam  Constitutional: He is oriented to person, place, and time.  HENT:  Nose: No mucosal edema.  Mouth/Throat: No oropharyngeal exudate or posterior oropharyngeal edema.  Eyes: Conjunctivae, EOM and lids are normal. Pupils are equal, round, and reactive to light.  Neck: No JVD present. Carotid bruit is not present. No edema present. No thyroid mass and no thyromegaly present.  Cardiovascular: S1 normal and S2 normal.  Exam reveals no gallop.   No murmur heard. Pulses:      Dorsalis pedis pulses are 2+ on the right side, and 2+ on the left side.  Respiratory: No respiratory distress. He has decreased breath sounds in the right lower field and the left lower field. He has no wheezes. He has no rhonchi. He has no rales.  GI: Soft. Bowel sounds are normal. There is no tenderness.  Musculoskeletal:       Right knee:  He exhibits swelling.       Left knee: He exhibits swelling.       Right ankle: He exhibits swelling.       Left ankle: He exhibits swelling.  Lymphadenopathy:    He has no cervical adenopathy.  Neurological: He is alert and oriented to person, place, and time. No cranial nerve deficit.  Skin: Skin is warm. Nails show no clubbing.  Psoriasis all over his body. Right lower extremity size of a dime ulceration with no signs of infection  Psychiatric: He has a normal mood and affect.      Data Reviewed: Basic Metabolic Panel:  Recent Labs Lab 11/23/16 0419 11/24/16 0419 11/25/16 0506 11/26/16 0430 11/28/16 0423  NA 137 136 137 134* 132*  K 3.6 3.9 3.7 3.5 3.7  CL 97* 95* 95* 89* 84*  CO2 34* 35* 36* 40* 39*  GLUCOSE 296* 332* 206* 347* 300*  BUN 20 17 16  22* 44*  CREATININE 1.33* 1.30* 1.21 1.39* 1.90*  CALCIUM 8.5* 8.7* 9.0 9.3 9.5   CBC:  Recent Labs Lab 11/25/16 0741 11/28/16 0423  WBC 9.7 7.8  HGB 13.8 15.5  HCT 43.9 48.4  MCV 78.6* 77.6*  PLT 218 241   BNP (last 3 results)  Recent Labs  11/20/16 1702  BNP 111.0*   CBG:  Recent Labs Lab 11/27/16 1652 11/27/16 2022 11/28/16 0742 11/28/16 1139 11/28/16 1640  GLUCAP 332* 297* 338* 360* 252*    Recent Results (from the past  240 hour(s))  Blood culture (routine x 2)     Status: None   Collection Time: 11/20/16  7:02 PM  Result Value Ref Range Status   Specimen Description BLOOD  RIGHT HAND  Final   Special Requests BOTTLES DRAWN AEROBIC AND ANAEROBIC  [facility]  Final   Culture NO GROWTH 5 DAYS  Final   Report Status 11/25/2016 FINAL  Final  Blood culture (routine x 2)     Status: None   Collection Time: 11/20/16  7:02 PM  Result Value Ref Range Status   Specimen Description BLOOD  LEFT HAND  Final   Special Requests   Final    BOTTLES DRAWN AEROBIC AND ANAEROBIC  AER 5CC ANA 5CC   Culture NO GROWTH 5 DAYS  Final   Report Status 11/25/2016 FINAL  Final      Scheduled Meds: .  allopurinol  200 mg Oral Daily  . aspirin EC  81 mg Oral Daily  . atorvastatin  40 mg Oral QHS  . budesonide (PULMICORT) nebulizer solution  0.25 mg Nebulization BID  . carvedilol  25 mg Oral Q12H  . chlorpheniramine-HYDROcodone  5 mL Oral Q12H  . clobetasol cream   Topical BID  . enoxaparin (LOVENOX) injection  40 mg Subcutaneous Q12H  . gabapentin  300 mg Oral TID  . guaiFENesin  600 mg Oral BID  . hydrALAZINE  25 mg Oral TID  . insulin aspart  0-5 Units Subcutaneous QHS  . insulin aspart  0-9 Units Subcutaneous TID WC  . insulin aspart  15 Units Subcutaneous TID WC  . insulin detemir  57 Units Subcutaneous QHS  . isosorbide dinitrate  20 mg Oral TID  . levofloxacin  500 mg Oral Daily  . pantoprazole  40 mg Oral Daily  . sacubitril-valsartan  1 tablet Oral BID  . sodium chloride  500 mL Intravenous Once  . sodium chloride flush  3 mL Intravenous Q12H    Assessment/Plan:  1. Acute kidney injury on chronic kidney disease stage II. This is secondary to over diuresis. Hold Lasix at this point. Recheck creatinine tomorrow morning.  2. Acute respiratory failure with hypoxia. Patient now has chronic respiratory failure. Patient qualifies for oxygen at night and also oxygen during the day. VBG showed a PCO2 of 73. Patient qualifies for noninvasive ventilation at night. I don't think we'll be able to get him noninvasive ventilation at home secondary to insurance going to probably lapsing soon 3. Vasovagal syncope on Sunday  4. Acute on chronic systolic congestive heart failure. EF has improved to 45%. hold Lasix today with over diuresis. Continue entresto. Daily weights on standing scale. 5. Pneumonia on CT scan. Continue Levaquin 6. Type 2 diabetes mellitus. Increase detemir insulin at night to 57 units. NovoLog 15 units 3 times a day with meals 7. Essential hypertension. Blood pressure stable today 8. Morbid obesity weight loss needed. 9. Hyperlipidemia unspecified on  atorvastatin 10. Psoriasis and right lower extremity wounds. Local wound care. Clobetasol cream. 11. Fungal infection of feet. Lac-Hydrin lotion first then antifungal cream as outpatient  Code Status:     Code Status Orders        Start     Ordered   11/20/16 2149  Full code  Continuous     12 /05/17 2148    Code Status History    Date Active Date Inactive Code Status Order ID Comments User Context   This patient has a current code status but no historical code status.  Disposition Plan: Evaluate daily for potential discharge. Need to see creatinine improve for disposition.   Consultants:  Cardiology  Antibiotics:  Levaquin  Time spent: 26 minutes.   Loletha Grayer  [employer]

## 2016-11-28 NOTE — Progress Notes (Signed)
Physical Therapy Treatment Patient Details Name: [name] MRN: [id number] DOB: 1966-12-10 Today's Date: 11/28/2016    History of Present Illness Patient is a 50 y.o. male admitted on 05 DEC after not having medications for 3 days and experiencing worsening SOB/coughing. Chest X-Ray detailed pneumonia. 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 Rapid response noted on 11/26/16.    PT Comments    Pt is making good progress towards goals with increased ambulation distance this date. No need for AD and no LOB noted. Pt performed all mobility on 3L of O2 with sats decreasing to mid 80s. Slight SOB symptoms noted. Pt unaware that sats dropped. Does not need any further PT follow up at this time as pt is close to baseline level. Will dc in house at this time.  Follow Up Recommendations  No PT follow up     Equipment Recommendations  None recommended by PT    Recommendations for Other Services       Precautions / Restrictions Precautions Precautions: Fall Restrictions Weight Bearing Restrictions: No    Mobility  Bed Mobility               General bed mobility comments: not performed as he was received in recliner  Transfers Overall transfer level: Independent               General transfer comment: safe technique performed with pt able to push from seated surface. Once standing, no LOB noted. All mobility performed with 3L of O2 donned.  Ambulation/Gait Ambulation/Gait assistance: Min guard Ambulation Distance (Feet): 300 Feet Assistive device: None Gait Pattern/deviations: Step-through pattern     General Gait Details: ambulated in hallway with safe technique and reciprocal gait pattern. Pt with slight SOB, however able to carry conversation during ambulation. O2 sats decreased to 80% and improved to 85% within 10 seconds of seated  rest break.   Stairs            Wheelchair Mobility    Modified Rankin (Stroke Patients Only)       Balance                                    Cognition Arousal/Alertness: Awake/alert Behavior During Therapy: WFL for tasks assessed/performed Overall Cognitive Status: Within Functional Limits for tasks assessed                      Exercises      General Comments        Pertinent Vitals/Pain Pain Assessment: No/denies pain    Home Living                      Prior Function            PT Goals (current goals can now be found in the care plan section) Acute Rehab PT Goals Patient Stated Goal: "To feel better" PT Goal Formulation: With patient Time For Goal Achievement: 12/09/16 Potential to Achieve Goals: Good Progress towards PT goals: Progressing toward goals    Frequency    Min 2X/week      PT Plan Current plan remains appropriate    Co-evaluation             End of Session Equipment Utilized During Treatment: Oxygen Activity Tolerance: Patient tolerated treatment well  Patient left: in chair;with call bell/phone within reach;with chair alarm set     Time: [phone] PT Time Calculation (min) (ACUTE ONLY): 18 min  Charges:  $Gait Training: 8-22 mins                    G Codes:      Ray,Stephanie 12-12-16, 5:05 PM  Greggory Stallion, PT, DPT [phone]

## 2016-11-28 NOTE — Care Management (Signed)
Spoke with Dr Neoma Laming and informed he will sign the home health orders until patient gets established with a PCP.  CM has provided patient a contact card for [employer]. Informed [facility] so home health nurse and social work can follow patient at discharge. Notified [facility].  Patient has not completed applications for [facility] as requested last week.

## 2016-11-29 LAB — GLUCOSE, CAPILLARY
Glucose-Capillary: 356 mg/dL — ABNORMAL HIGH (ref 65–99)
Glucose-Capillary: 222 mg/dL — ABNORMAL HIGH (ref 65–99)

## 2016-11-29 LAB — BASIC METABOLIC PANEL
Anion gap: 8 (ref 5–15)
BUN: 50 mg/dL — ABNORMAL HIGH (ref 6–20)
CO2: 40 mmol/L — ABNORMAL HIGH (ref 22–32)
Calcium: 9.4 mg/dL (ref 8.9–10.3)
Chloride: 83 mmol/L — ABNORMAL LOW (ref 101–111)
Creatinine, Ser: 1.93 mg/dL — ABNORMAL HIGH (ref 0.61–1.24)
GFR calc Af Amer: 46 mL/min — ABNORMAL LOW (ref >60)
GFR calc non Af Amer: 39 mL/min — ABNORMAL LOW (ref >60)
Glucose, Bld: 368 mg/dL — ABNORMAL HIGH (ref 65–99)
Potassium: 4.3 mmol/L (ref 3.5–5.1)
Sodium: 131 mmol/L — ABNORMAL LOW (ref 135–145)

## 2016-11-29 MED ORDER — CARVEDILOL 25 MG PO TABS: 25.0000 mg | ORAL_TABLET | Freq: Two times a day (BID) | ORAL | 0 refills | Status: DC

## 2016-11-29 MED ORDER — OMEPRAZOLE 20 MG PO CPDR: 20.0000 mg | DELAYED_RELEASE_CAPSULE | Freq: Two times a day (BID) | ORAL | 0 refills | Status: DC

## 2016-11-29 MED ORDER — GABAPENTIN 300 MG PO CAPS: 300.0000 mg | ORAL_CAPSULE | Freq: Three times a day (TID) | ORAL | 0 refills | Status: DC

## 2016-11-29 MED ORDER — ASPIRIN EC 81 MG PO TBEC: 81.0000 mg | DELAYED_RELEASE_TABLET | Freq: Every day | ORAL | 0 refills | Status: DC

## 2016-11-29 MED ORDER — CLOBETASOL PROPIONATE 0.05 % EX OINT: 1.0000 "application " | TOPICAL_OINTMENT | Freq: Two times a day (BID) | CUTANEOUS | 0 refills | Status: DC

## 2016-11-29 MED ORDER — TORSEMIDE 20 MG PO TABS: 20.0000 mg | ORAL_TABLET | Freq: Two times a day (BID) | ORAL | 0 refills | Status: DC

## 2016-11-29 MED ORDER — INSULIN ASPART 100 UNIT/ML ~~LOC~~ SOLN: 15.0000 [IU] | Freq: Three times a day (TID) | SUBCUTANEOUS | 11 refills | Status: DC

## 2016-11-29 MED ORDER — LEVOFLOXACIN 500 MG PO TABS: 500.0000 mg | ORAL_TABLET | Freq: Every day | ORAL | 0 refills | Status: DC

## 2016-11-29 MED ORDER — INSULIN REGULAR HUMAN 100 UNIT/ML IJ SOLN: 10.0000 [IU] | Freq: Three times a day (TID) | INTRAMUSCULAR | 11 refills | Status: DC

## 2016-11-29 MED ORDER — HYDRALAZINE HCL 25 MG PO TABS: 25.0000 mg | ORAL_TABLET | Freq: Three times a day (TID) | ORAL | 0 refills | Status: DC

## 2016-11-29 MED ORDER — MAGNESIUM OXIDE 400 (241.3 MG) MG PO TABS: 800.0000 mg | ORAL_TABLET | Freq: Two times a day (BID) | ORAL | 0 refills | Status: DC

## 2016-11-29 MED ORDER — INSULIN DETEMIR 100 UNIT/ML ~~LOC~~ SOLN: 57.0000 [IU] | Freq: Every day | SUBCUTANEOUS | 0 refills | Status: DC

## 2016-11-29 MED ORDER — ATORVASTATIN CALCIUM 40 MG PO TABS: 40.0000 mg | ORAL_TABLET | Freq: Every day | ORAL | 0 refills | Status: DC

## 2016-11-29 MED ORDER — ISOSORBIDE DINITRATE 20 MG PO TABS: 20.0000 mg | ORAL_TABLET | Freq: Three times a day (TID) | ORAL | 0 refills | Status: DC

## 2016-11-29 MED ORDER — INSULIN GLULISINE 100 UNIT/ML IJ SOLN: 15.0000 [IU] | Freq: Three times a day (TID) | INTRAMUSCULAR | 11 refills | Status: DC

## 2016-11-29 MED ORDER — ALBUTEROL SULFATE HFA 108 (90 BASE) MCG/ACT IN AERS: 2.0000 | INHALATION_SPRAY | RESPIRATORY_TRACT | 0 refills | Status: DC | PRN

## 2016-11-29 MED ORDER — ALLOPURINOL 100 MG PO TABS: 200.0000 mg | ORAL_TABLET | Freq: Every day | ORAL | 0 refills | Status: DC

## 2016-11-29 MED ORDER — INSULIN DETEMIR 100 UNIT/ML ~~LOC~~ SOLN: 57.0000 [IU] | Freq: Every day | SUBCUTANEOUS | 11 refills | Status: DC

## 2016-11-29 NOTE — Progress Notes (Signed)
Patient states having some difficulty swallowing. Has declined bipap for the night.

## 2016-11-29 NOTE — Progress Notes (Signed)
Dressing to right lower leg changed per patient request. Wound is appox. [name] Kitchen05cm round. Cleansed with NS, dried, gauze applied and paper taped. I will continue to assess until discharge.

## 2016-11-29 NOTE — Discharge Summary (Addendum)
[facility] [place] NAME: [name]    MR#:  [id number]  DATE OF BIRTH:  06-05-1966  DATE OF ADMISSION:  11/20/2016 ADMITTING PHYSICIAN: Vaughan Basta, MD  DATE OF DISCHARGE: 11/29/2016  PRIMARY CARE PHYSICIAN: [facility]    ADMISSION DIAGNOSIS:  SOB (shortness of breath) [R06.02] Hypoxia [R09.02] Elevated troponin [R74.8] Recurrent left pleural effusion [J90] Acute on chronic congestive heart failure, unspecified congestive heart failure type ([place]) [I50.9] Community acquired pneumonia, unspecified laterality [J18.9]  DISCHARGE DIAGNOSIS:  Principal Problem:   Acute on chronic systolic CHF (congestive heart failure), NYHA class 3 (HCC) Active Problems:   Acute respiratory failure with hypoxia (HCC)   Acute on chronic systolic CHF (congestive heart failure) ([place])   SECONDARY DIAGNOSIS:   Past Medical History:  Diagnosis Date  . Bell's palsy   . CHF (congestive heart failure) ([place])   . Diabetes mellitus without complication ([place])   . Gout   . Hypercholesteremia   . Hypertension   . NSVT (nonsustained ventricular tachycardia) [facility])     HOSPITAL COURSE:   49 year old male with a history of ischemic cardiomyopathy EF of 45% and nonsustained the trigger tachycardia who presented with atypical chest pain and shortness of breath. For further details please refer to H&P.   1. Acute hypoxic respiratory failure in the setting of acute on chronic systolic heart failure and pneumonia Patient was treated with IV Lasix and antibiotics for pneumonia and has diuresed. He did qualify for oxygen. He most likely has sleep apnea and will need outpatient evaluation.  2. Acute kidney injury on chronic kidney disease stage II due to overdiuresis: Patient's diuretics have been discontinued for now until follow-up with cardiology on Monday.  3. Acute on chronic systolic heart failure with ejection of 45%: Due to increasing  creatinine entresto has been discontinued and may be restarted once creatinine is back to baseline. He will continue on Coreg, isosorbide and hydralazine  4. Community-acquired pneumonia: Patient will continue Levaquin  5. Diabetes: Patient will continue outpatient long-acting insulin  6. Essential hypertension: Blood pressure stable on Coreg and hydralazine and isosorbide 7. Neuropathy: Patient is complaining of some neuropathic type pain in the upper extremities. I recommend the patient has follow-up with EMG and his PCP.  DISCHARGE CONDITIONS AND DIET:   Stable for discharge on heart healthy diet/diabetic  CONSULTS OBTAINED:  Treatment Team:  Dionisio David, MD  DRUG ALLERGIES:  No Known Allergies  DISCHARGE MEDICATIONS:   Current Discharge Medication List    START taking these medications   Details  insulin detemir (LEVEMIR) 100 UNIT/ML injection Inject 0.57 mLs (57 Units total) into the skin at bedtime. Qty: 10 mL, Refills: 0    insulin glulisine (APIDRA) 100 UNIT/ML injection Inject 0.15 mLs (15 Units total) into the skin 3 (three) times daily with meals. Qty: 10 mL, Refills: 11    levofloxacin (LEVAQUIN) 500 MG tablet Take 1 tablet (500 mg total) by mouth daily. Qty: 5 tablet, Refills: 0      CONTINUE these medications which have CHANGED   Details  albuterol (PROVENTIL HFA;VENTOLIN HFA) 108 (90 Base) MCG/ACT inhaler Inhale 2 puffs into the lungs every 4 (four) hours as needed for wheezing or shortness of breath. Qty: 6.7 g, Refills: 0    allopurinol (ZYLOPRIM) 100 MG tablet Take 2 tablets (200 mg total) by mouth daily. Qty: 30 tablet, Refills: 0    aspirin EC 81 MG tablet Take 1 tablet (81 mg total) by  mouth daily. Qty: 30 tablet, Refills: 0    atorvastatin (LIPITOR) 40 MG tablet Take 1 tablet (40 mg total) by mouth at bedtime. Qty: 30 tablet, Refills: 0    carvedilol (COREG) 25 MG tablet Take 1 tablet (25 mg total) by mouth every 12 (twelve) hours. Qty: 60  tablet, Refills: 0    clobetasol ointment (TEMOVATE) 1.24 % Apply 1 application topically 2 (two) times daily. Qty: 30 g, Refills: 0    gabapentin (NEURONTIN) 300 MG capsule Take 1 capsule (300 mg total) by mouth 3 (three) times daily. Qty: 90 capsule, Refills: 0    hydrALAZINE (APRESOLINE) 25 MG tablet Take 1 tablet (25 mg total) by mouth 3 (three) times daily. Qty: 90 tablet, Refills: 0    isosorbide dinitrate (ISORDIL) 20 MG tablet Take 1 tablet (20 mg total) by mouth 3 (three) times daily. Qty: 90 tablet, Refills: 0    magnesium oxide (MAG-OX) 400 (241.3 Mg) MG tablet Take 2 tablets (800 mg total) by mouth 2 (two) times daily. Qty: 60 tablet, Refills: 0    omeprazole (PRILOSEC) 20 MG capsule Take 1 capsule (20 mg total) by mouth 2 (two) times daily before a meal. Qty: 60 capsule, Refills: 0    torsemide (DEMADEX) 20 MG tablet Take 1 tablet (20 mg total) by mouth 2 (two) times daily. Do not start this until  follow up with CARDIOLOGY Qty: 30 tablet, Refills: 0      STOP taking these medications     clotrimazole (LOTRIMIN) 1 % cream      insulin NPH Human (HUMULIN N,NOVOLIN N) 100 UNIT/ML injection      methocarbamol (ROBAXIN) 500 MG tablet      potassium chloride SA (K-DUR,KLOR-CON) 20 MEQ tablet      insulin regular (NOVOLIN R,HUMULIN R) 100 units/mL injection               Today   CHIEF COMPLAINT:  Patient doing well this morning complaining that he thinks he may have had some nerve damage on his ulnar nerve his hands sometimes feel weak he denies cervical trauma numbness or tingling of his extremities VITAL SIGNS:  Blood pressure 111/66, pulse 88, temperature 97.9 F (36.6 C), temperature source Oral, resp. rate 18, height 5\' 3"  (1.6 m), weight 127 kg (279 lb 14.4 oz), SpO2 100 %.   REVIEW OF SYSTEMS:  Review of Systems  Constitutional: Negative.  Negative for chills, fever and malaise/fatigue.  HENT: Negative.  Negative for ear discharge, ear pain,  hearing loss, nosebleeds and sore throat.   Eyes: Negative.  Negative for blurred vision and pain.  Respiratory: Negative.  Negative for cough, hemoptysis, shortness of breath and wheezing.   Cardiovascular: Positive for leg swelling. Negative for chest pain and palpitations.  Gastrointestinal: Negative.  Negative for abdominal pain, blood in stool, diarrhea, nausea and vomiting.  Genitourinary: Negative.  Negative for dysuria.  Musculoskeletal: Negative.  Negative for back pain.  Skin: Negative.   Neurological: Negative for dizziness, tremors, speech change, focal weakness, seizures and headaches.       Ulnar nerve pain  Endo/Heme/Allergies: Negative.  Does not bruise/bleed easily.  Psychiatric/Behavioral: Negative.  Negative for depression, hallucinations and suicidal ideas.     PHYSICAL EXAMINATION:  GENERAL:  50 y.o.-year-old patient lying in the bed with no acute distress.  NECK:  Supple, no jugular venous distention. No thyroid enlargement, no tenderness.  LUNGS: Normal breath sounds bilaterally, no wheezing, rales,rhonchi  No use of accessory muscles of respiration.  CARDIOVASCULAR:  S1, S2 normal. No murmurs, rubs, or gallops.  ABDOMEN: Soft, non-tender, non-distended. Bowel sounds present. No organomegaly or mass.  EXTREMITIES: 2+ pedal edema, no cyanosis, or clubbing.  PSYCHIATRIC: The patient is alert and oriented x 3.  SKIN: No obvious rash, lesion, or ulcer.   Neuro cranial nerves II through XII are intact. He demonstrates muscle tightness on left forearm DATA REVIEW:   CBC  Recent Labs Lab 11/28/16 0423  WBC 7.8  HGB 15.5  HCT 48.4  PLT 241    Chemistries   Recent Labs Lab 11/29/16 0658  NA 131*  K 4.3  CL 83*  CO2 40*  GLUCOSE 368*  BUN 50*  CREATININE 1.93*  CALCIUM 9.4    Cardiac Enzymes No results for input(s): TROPONINI in the last 168 hours.  Microbiology Results  @[email or web address] @  RADIOLOGY:  No results found.    Management plans  discussed with the patient and he is in agreement. Stable for discharge home  Patient should follow up with pcp  CODE STATUS:     Code Status Orders        Start     Ordered   11/20/16 2149  Full code  Continuous     11/20/16 2148    Code Status History    Date Active Date Inactive Code Status Order ID Comments User Context   This patient has a current code status but no historical code status.      TOTAL TIME TAKING CARE OF THIS PATIENT: 38 minutes.    Note: This dictation was prepared with Dragon dictation along with smaller phrase technology. Any transcriptional errors that result from this process are unintentional.  [name] M.D on 11/29/2016 at 2:11 PM  Between 7am to 6pm - Pager - [phone] After 6pm go to [email or web address] - password [email or web address] [facility]  Office  [phone]  CC: Primary care physician; [facility]

## 2016-11-29 NOTE — Progress Notes (Signed)
SUBJECTIVE: Feeling much better   Vitals:   11/29/16 0539 11/29/16 0733 11/29/16 0910 11/29/16 0917  BP: (!) 135/92     Pulse: 82     Resp: 18     Temp: 97.7 F (36.5 C)     TempSrc: Oral     SpO2: 95% 96% (!) 79% 92%  Weight: 279 lb 14.4 oz (127 kg)     Height:        Intake/Output Summary (Last 24 hours) at 11/29/16 1150 Last data filed at 11/29/16 1024  Gross per 24 hour  Intake             1080 ml  Output             2000 ml  Net             -920 ml    LABS: Basic Metabolic Panel:  Recent Labs  11/28/16 0423 11/29/16 0658  NA 132* 131*  K 3.7 4.3  CL 84* 83*  CO2 39* 40*  GLUCOSE 300* 368*  BUN 44* 50*  CREATININE 1.90* 1.93*  CALCIUM 9.5 9.4   Liver Function Tests: No results for input(s): AST, ALT, ALKPHOS, BILITOT, PROT, ALBUMIN in the last 72 hours. No results for input(s): LIPASE, AMYLASE in the last 72 hours. CBC:  Recent Labs  11/28/16 0423  WBC 7.8  HGB 15.5  HCT 48.4  MCV 77.6*  PLT 241   Cardiac Enzymes: No results for input(s): CKTOTAL, CKMB, CKMBINDEX, TROPONINI in the last 72 hours. BNP: Invalid input(s): POCBNP D-Dimer: No results for input(s): DDIMER in the last 72 hours. Hemoglobin A1C: No results for input(s): HGBA1C in the last 72 hours. Fasting Lipid Panel: No results for input(s): CHOL, HDL, LDLCALC, TRIG, CHOLHDL, LDLDIRECT in the last 72 hours. Thyroid Function Tests: No results for input(s): TSH, T4TOTAL, T3FREE, THYROIDAB in the last 72 hours.  Invalid input(s): FREET3 Anemia Panel: No results for input(s): VITAMINB12, FOLATE, FERRITIN, TIBC, IRON, RETICCTPCT in the last 72 hours.   PHYSICAL EXAM General: Well developed, well nourished, in no acute distress HEENT:  Normocephalic and atramatic Neck:  No JVD.  Lungs: Clear bilaterally to auscultation and percussion. Heart: HRRR . Normal S1 and S2 without gallops or murmurs.  Abdomen: Bowel sounds are positive, abdomen soft and non-tender  Msk:  Back normal,  normal gait. Normal strength and tone for age. Extremities: No clubbing, cyanosis or edema.   Neuro: Alert and oriented X 3. Psych:  Good affect, responds appropriately  TELEMETRY:NSR  ASSESSMENT AND PLAN: CHF/cardiomyopathy, doing better, going home with f/u Tuesday 1 pm.  Principal Problem:   Acute on chronic systolic CHF (congestive heart failure), NYHA class 3 (HCC) Active Problems:   Acute respiratory failure with hypoxia (HCC)   Acute on chronic systolic CHF (congestive heart failure) ([place])    KHAN,SHAUKAT A, MD, [facility] 11/29/2016 11:50 AM

## 2016-11-29 NOTE — Care Management (Addendum)
Patient for discharge home today.  [facility].  Patient still has not completed the application as requested so CM not able to expidite the application process for [facility] referral.  Have requested home health order for SN and SW.  [facility] has delivered the portable 02 to patient's room.  Patient is not discharging on entresto.

## 2016-11-29 NOTE — Progress Notes (Signed)
Inpatient Diabetes Program Recommendations  AACE/ADA: New Consensus Statement on Inpatient Glycemic Control (2015)  Target Ranges:  Prepandial:   less than 140 mg/dL      Peak postprandial:   less than 180 mg/dL (1-2 hours)      Critically ill patients:  140 - 180 mg/dL  Results for JULES, BATY (MRN [id number]) as of 11/29/2016 08:36  Ref. Range 11/29/2016 06:58  Glucose Latest Ref Range: 65 - 99 mg/dL 368 (H)   Results for SHAKIR, PETROSINO (MRN [id number]) as of 11/29/2016 08:36  Ref. Range 11/28/2016 07:42 11/28/2016 11:39 11/28/2016 16:40 11/28/2016 21:28  Glucose-Capillary Latest Ref Range: 65 - 99 mg/dL 338 (H) 360 (H) 252 (H) 302 (H)   Review of Glycemic Control  Diabetes history: DM2 Outpatient Diabetes medications: NPH 40 units QHS, Regular 40 units TID with meals (no insulin in 3 days prior to admission because he ran out of insulin) Current orders for Inpatient glycemic control: Levemir 57 units QHS, Novolog 15 units TID with meals, Novolog 0-9 units TID with meals, Novolog 0-5 units QHS  Inpatient Diabetes Program Recommendations: Insulin - Basal: Please increase Lantus to 64 units QHS. Correction (SSI): Please increase Novolog correction to resistant scale. Insulin - Meal Coverage: Please consider increasing meal coverage to 20 units TID with meals. HgbA1C: A1C 9.8% on 11/21/16 indicating an average glucose of 235 mg/dl over the past 2-3 months. Recommend patient follow up with PCP regarding improving DM control.  Thanks, Barnie Alderman, RN, MSN, CDE Diabetes Coordinator Inpatient Diabetes Program [phone] (Team Pager from 8am to 5pm)

## 2016-11-29 NOTE — Progress Notes (Signed)
SATURATION QUALIFICATIONS: (This note is used to comply with regulatory documentation for home oxygen)  Patient Saturations on Room Air at Rest = 79%  Patient Saturations on Room Air while Ambulating = n/a%  Patient Saturations on 2 Liters of oxygen while resting = 92%  Please briefly explain why patient needs home oxygen:

## 2016-12-04 ENCOUNTER — Telehealth: Payer: Self-pay | Admitting: Family

## 2016-12-04 ENCOUNTER — Ambulatory Visit: Payer: BLUE CROSS/BLUE SHIELD | Admitting: Family

## 2016-12-04 NOTE — Telephone Encounter (Signed)
Patient missed his initial appointment at the [facility] on 12/04/16. Will attempt to reschedule.

## 2016-12-07 ENCOUNTER — Telehealth: Payer: Self-pay

## 2016-12-07 NOTE — Telephone Encounter (Signed)
Called pt and discussed that we found an insurance card on file showing he is effective until 12/16/2016. I explained we can not see him since he has insurance and if he does not renew that he can come back to [place]. He stated he came because he does not have the money for his copay for [facility] but it sounded like they did not know he had insurance which his co pay is $5 and they told him $25. I told him to call them back and let them know he has insurance and to try again with them. PT verbalized understanding.

## 2016-12-15 ENCOUNTER — Emergency Department
Admission: EM | Admit: 2016-12-15 | Discharge: 2016-12-15 | Disposition: A | Discharge status: Home / Self Care | Payer: Medicaid Other | Attending: Emergency Medicine | Admitting: Emergency Medicine

## 2016-12-15 ENCOUNTER — Emergency Department: Payer: Medicaid Other

## 2016-12-15 DIAGNOSIS — Z7982 Long term (current) use of aspirin: Secondary | ICD-10-CM | POA: Insufficient documentation

## 2016-12-15 DIAGNOSIS — I5023 Acute on chronic systolic (congestive) heart failure: Secondary | ICD-10-CM | POA: Insufficient documentation

## 2016-12-15 DIAGNOSIS — R609 Edema, unspecified: Secondary | ICD-10-CM

## 2016-12-15 DIAGNOSIS — I110 Hypertensive heart disease with heart failure: Secondary | ICD-10-CM | POA: Diagnosis not present

## 2016-12-15 DIAGNOSIS — R600 Localized edema: Secondary | ICD-10-CM | POA: Insufficient documentation

## 2016-12-15 DIAGNOSIS — I10 Essential (primary) hypertension: Secondary | ICD-10-CM

## 2016-12-15 DIAGNOSIS — E119 Type 2 diabetes mellitus without complications: Secondary | ICD-10-CM | POA: Diagnosis not present

## 2016-12-15 DIAGNOSIS — R0602 Shortness of breath: Secondary | ICD-10-CM | POA: Diagnosis present

## 2016-12-15 DIAGNOSIS — Z79899 Other long term (current) drug therapy: Secondary | ICD-10-CM | POA: Diagnosis not present

## 2016-12-15 DIAGNOSIS — Z794 Long term (current) use of insulin: Secondary | ICD-10-CM | POA: Insufficient documentation

## 2016-12-15 LAB — COMPREHENSIVE METABOLIC PANEL
ALT: 23 U/L (ref 17–63)
AST: 19 U/L (ref 15–41)
Albumin: 3.2 g/dL — ABNORMAL LOW (ref 3.5–5.0)
Alkaline Phosphatase: 83 U/L (ref 38–126)
Anion gap: 5 (ref 5–15)
BUN: 15 mg/dL (ref 6–20)
CO2: 35 mmol/L — ABNORMAL HIGH (ref 22–32)
Calcium: 8.4 mg/dL — ABNORMAL LOW (ref 8.9–10.3)
Chloride: 103 mmol/L (ref 101–111)
Creatinine, Ser: 1.08 mg/dL (ref 0.61–1.24)
GFR calc Af Amer: >60 mL/min (ref >60)
GFR calc non Af Amer: >60 mL/min (ref >60)
Glucose, Bld: 68 mg/dL (ref 65–99)
Potassium: 3.3 mmol/L — ABNORMAL LOW (ref 3.5–5.1)
Sodium: 143 mmol/L (ref 135–145)
Total Bilirubin: 0.9 mg/dL (ref 0.3–1.2)
Total Protein: 6.6 g/dL (ref 6.5–8.1)

## 2016-12-15 LAB — CBC
HCT: 41.1 % (ref 40.0–52.0)
Hemoglobin: 13.2 g/dL (ref 13.0–18.0)
MCH: 24.8 pg — ABNORMAL LOW (ref 26.0–34.0)
MCHC: 32.1 g/dL (ref 32.0–36.0)
MCV: 77.2 fL — ABNORMAL LOW (ref 80.0–100.0)
Platelets: 177 10*3/uL (ref 150–440)
RBC: 5.32 MIL/uL (ref 4.40–5.90)
RDW: 17.9 % — ABNORMAL HIGH (ref 11.5–14.5)
WBC: 10.3 10*3/uL (ref 3.8–10.6)

## 2016-12-15 LAB — TROPONIN I
Troponin I: 0.06 ng/mL (ref <0.03)
Troponin I: 0.07 ng/mL (ref <0.03)

## 2016-12-15 LAB — FIBRIN DERIVATIVES D-DIMER (ARMC ONLY): Fibrin derivatives D-dimer (ARMC): 429 (ref 0–499)

## 2016-12-15 LAB — BRAIN NATRIURETIC PEPTIDE: B Natriuretic Peptide: 41 pg/mL (ref 0.0–100.0)

## 2016-12-15 MED ORDER — NITROGLYCERIN 2 % TD OINT
1.0000 [in_us] | TOPICAL_OINTMENT | Freq: Once | TRANSDERMAL | Status: AC
  Administered 2016-12-15: 1 [in_us] via TOPICAL
  Filled 2016-12-15: qty 1

## 2016-12-15 MED ORDER — TORSEMIDE 20 MG PO TABS: 40.0000 mg | ORAL_TABLET | Freq: Two times a day (BID) | ORAL | 0 refills | Status: DC

## 2016-12-15 MED ORDER — FUROSEMIDE 10 MG/ML IJ SOLN
40.0000 mg | Freq: Once | INTRAMUSCULAR | Status: AC
  Administered 2016-12-15: 40 mg via INTRAVENOUS
  Filled 2016-12-15: qty 4

## 2016-12-15 MED ORDER — HYDRALAZINE HCL 50 MG PO TABS: 50.0000 mg | ORAL_TABLET | Freq: Three times a day (TID) | ORAL | 0 refills | Status: DC

## 2016-12-15 NOTE — ED Notes (Signed)
Pt. States burning feeling in chest x3 days, worse with coughing and deep breathing. Pt. States coughing up yellow sputum. Denies fevers, presence of blood in sputum.

## 2016-12-15 NOTE — ED Notes (Signed)
Pt. Provided sandwich tray and 2 sprite zeros as requested with MD approval.

## 2016-12-15 NOTE — ED Provider Notes (Signed)
Time Seen: Approximately *2055  I have reviewed the triage notes  Chief Complaint: Shortness of Breath   History of Present Illness: [name] is a 50 y.o. male *who presents with shortness of breath and some bilateral leg swelling. Patient has a history of congestive heart failure he states he has been taking his medications. He also has some type 2 diabetes. Patient denies any chest pain he states he simply had increased shortness of breath especially with physical exertion and is noticed increased swelling. He states he has been taking his medications at home. He states that he has not seen a doctor yet but does have an appointment scheduled for early next month. He states he's also had some occasional vomiting with coughing and also some incontinence of stool. He denies any rectal pain or abdominal pain or focal weakness in the upper or lower extremities.   Past Medical History:  Diagnosis Date  . Bell's palsy   . CHF (congestive heart failure) ([place])   . Diabetes mellitus without complication ([place])   . Gout   . Hypercholesteremia   . Hypertension   . NSVT (nonsustained ventricular tachycardia) [facility])     Patient Active Problem List   Diagnosis Date Noted  . Acute on chronic systolic CHF (congestive heart failure), NYHA class 3 ([place]) 11/20/2016  . Acute respiratory failure with hypoxia ([place]) 11/20/2016  . Acute on chronic systolic CHF (congestive heart failure) ([place]) 11/20/2016  . Controlled type 2 diabetes with leg or foot ulcer 06/25/2016    No past surgical history on file.  No past surgical history on file.  Current Outpatient Rx  . Order #: [id number] Class: Print  . Order #: [id number] Class: Print  . Order #: [id number] Class: Print  . Order #: [id number] Class: Print  . Order #: [id number] Class: Print  . Order #: [id number] Class: Print  . Order #: [id number] Class: Print  . Order #: [id number] Class: Print  . Order #: [id number] Class: Print  . Order #:  [id number] Class: Print  . Order #: [id number] Class: Print  . Order #: [id number] Class: Print  . Order #: [id number] Class: Print  . Order #: [id number] Class: Print  . Order #: [id number] Class: Print    Allergies:  Patient has no known allergies.  Family History: Family History  Problem Relation Age of Onset  . Heart failure Mother   . Kidney failure Brother     Social History: Social History  Substance Use Topics  . Smoking status: Never Smoker  . Smokeless tobacco: Never Used  . Alcohol use Yes     Review of Systems:   10 point review of systems was performed and was otherwise negative:  Constitutional: No fever Eyes: No visual disturbances ENT: No sore throat, ear pain Cardiac: No chest pain Respiratory: Shortness of breath mainly with lying supine and also physical exertion at this point consisted of walking. Abdomen: No abdominal pain, no vomiting, No diarrhea Endocrine: No weight loss, No night sweats Extremities: Increased swelling in both lower extremities Skin: No rashes, easy bruising Neurologic: No focal weakness, trouble with speech or swollowing Urologic: No dysuria, Hematuria, or urinary frequency Patient is currently on home oxygen supplemental therapy.  Physical Exam:  ED Triage Vitals  Enc Vitals Group     BP 12/15/16 1733 (!) 163/124     Pulse Rate 12/15/16 1733 95     Resp 12/15/16 1733 20     Temp 12/15/16 1733 97.7 F (36.5 C)     Temp Source 12/15/16 1733 Oral  SpO2 12/15/16 1733 (!) 85 %     Weight 12/15/16 1742 291 lb (132 kg)     Height --      Head Circumference --      Peak Flow --      Pain Score 12/15/16 1743 10     Pain Loc --      Pain Edu? --      Excl. in [place]? --     General: Awake , Alert , and Oriented times 3; GCS 15 Patient full and complete sentences Head: Normal cephalic , atraumatic Eyes: Pupils equal , round, reactive to light Nose/Throat: No nasal drainage, patent upper airway without erythema or exudate.  Neck:  Supple, Full range of motion, No anterior adenopathy or palpable thyroid masses Lungs: Clear to auscultation especially at the apices with limited air movement at the bases Heart: Regular rate, regular rhythm without murmurs , gallops , or rubs Abdomen: Morbidly obese Soft, non tender without rebound, guarding , or rigidity; bowel sounds positive and symmetric in all 4 quadrants. No organomegaly .        Extremities: Circumferential edema in both lower extremities Neurologic: normal ambulation, Motor symmetric without deficits, sensory intact Skin: warm, dry, no rashes   Labs:   All laboratory work was reviewed including any pertinent negatives or positives listed below:  Labs Reviewed  CBC - Abnormal; Notable for the following:       Result Value   MCV 77.2 (*)    MCH 24.8 (*)    RDW 17.9 (*)    All other components within normal limits  COMPREHENSIVE METABOLIC PANEL - Abnormal; Notable for the following:    Potassium 3.3 (*)    CO2 35 (*)    Calcium 8.4 (*)    Albumin 3.2 (*)    All other components within normal limits  TROPONIN I - Abnormal; Notable for the following:    Troponin I 0.06 (*)    All other components within normal limits  BRAIN NATRIURETIC PEPTIDE  FIBRIN DERIVATIVES D-DIMER ([facility] ONLY)  TROPONIN I  Review of his previous labs show a troponin at 6 been consistently elevated 0.05  EKG:  ED ECG REPORT I, Daymon Larsen, the attending physician, personally viewed and interpreted this ECG.  Date: 12/15/2016 EKG Time: 1728 Rate:106 Rhythm: Sinus tachycardia QRS Axis: normal Intervals: normal ST/T Wave abnormalities: normal Conduction Disturbances: none Narrative Interpretation: unremarkable No acute ischemic changes   Radiology:  "Dg Chest 2 View  Result Date: 12/15/2016 CLINICAL DATA:  Patient with shortness of breath for 3 days. Leg swelling. Recent hospitalization for pneumonia. EXAM: CHEST  2 VIEW COMPARISON:  Chest radiograph 11/22/2016; CT chest  11/23/2016. FINDINGS: Enlarged cardiac and mediastinal contours. No consolidative pulmonary opacities. No pleural effusion or pneumothorax. Mid thoracic spine degenerative changes. Elevation right hemidiaphragm. IMPRESSION: Cardiomegaly.  No acute cardiopulmonary process. Electronically Signed   By: Lovey Newcomer M.D.   On: 12/15/2016 18:49   Dg Chest 2 View  Result Date: 11/22/2016 CLINICAL DATA:  Congestive heart failure and ischemic cardiomyopathy. EXAM: CHEST  2 VIEW COMPARISON:  November 20, 2016 FINDINGS: Stable cardiomegaly. Mild edema. No other interval changes or acute abnormalities. IMPRESSION: Mild edema. Electronically Signed   By: Dorise Bullion III M.D   On: 11/22/2016 14:57   Ct Chest Wo Contrast  Result Date: 11/23/2016 CLINICAL DATA:  Shortness of breath and cough for 1 week. EXAM: CT CHEST WITHOUT CONTRAST TECHNIQUE: Multidetector CT imaging of the chest was performed following  the standard protocol without IV contrast. COMPARISON:  Chest x-ray 11/22/2016 FINDINGS: Cardiovascular: Heart is mildly enlarged. Scattered coronary artery calcifications in the right coronary artery, left circumflex and left anterior descending coronary artery. Aorta is normal caliber. Mediastinum/Nodes: Mildly enlarged mediastinal lymph nodes. Index prevascular node has a short axis diameter of 12 mm on image 32. Index AP window lymph node has a short axis diameter of 17 mm on image 40. Subcarinal lymph node has a short axis diameter of 16 mm on image 55. No visible hilar adenopathy on this unenhanced study. No axillary adenopathy. Lungs/Pleura: Scattered mild ground-glass opacities in the lungs. Linear atelectasis in the lung bases. No effusions or confluent airspace opacities. Upper Abdomen: Imaging into the upper abdomen shows no acute findings. Musculoskeletal: Chest wall soft tissues are unremarkable. No acute bony abnormality or focal bone lesion. IMPRESSION: Mild mediastinal adenopathy. While this could be  reactive, cannot completely exclude lymphoproliferative adenopathy. Consider follow-up in 3-6 months after acute symptoms resolve. Cardiomegaly. Scattered ground-glass opacities in the lungs could reflect early edema or mild alveolitis. Coronary artery disease. Electronically Signed   By: Rolm Baptise M.D.   On: 11/23/2016 09:29   US Venous Img Lower Bilateral  Result Date: 11/22/2016 CLINICAL DATA:  Shortness of breath. EXAM: BILATERAL LOWER EXTREMITY VENOUS DOPPLER ULTRASOUND TECHNIQUE: Gray-scale sonography with graded compression, as well as color Doppler and duplex ultrasound were performed to evaluate the lower extremity deep venous systems from the level of the common femoral vein and including the common femoral, femoral, profunda femoral, popliteal and calf veins including the posterior tibial, peroneal and gastrocnemius veins when visible. The superficial great saphenous vein was also interrogated. Spectral Doppler was utilized to evaluate flow at rest and with distal augmentation maneuvers in the common femoral, femoral and popliteal veins. COMPARISON:  None. FINDINGS: RIGHT LOWER EXTREMITY Common Femoral Vein: No evidence of thrombus. Normal compressibility, respiratory phasicity and response to augmentation. Saphenofemoral Junction: No evidence of thrombus. Normal compressibility and flow on color Doppler imaging. Profunda Femoral Vein: No evidence of thrombus. Normal compressibility and flow on color Doppler imaging. Femoral Vein: No evidence of thrombus. Normal compressibility, respiratory phasicity and response to augmentation. Popliteal Vein: No evidence of thrombus. Normal compressibility, respiratory phasicity and response to augmentation. Calf Veins: No evidence of thrombus. Normal compressibility and flow on color Doppler imaging. Superficial Great Saphenous Vein: No evidence of thrombus. Normal compressibility and flow on color Doppler imaging. Other Findings:  None. LEFT LOWER EXTREMITY  Common Femoral Vein: No evidence of thrombus. Normal compressibility, respiratory phasicity and response to augmentation. Saphenofemoral Junction: No evidence of thrombus. Normal compressibility and flow on color Doppler imaging. Profunda Femoral Vein: No evidence of thrombus. Normal compressibility and flow on color Doppler imaging. Femoral Vein: No evidence of thrombus. Normal compressibility, respiratory phasicity and response to augmentation. Popliteal Vein: No evidence of thrombus. Normal compressibility, respiratory phasicity and response to augmentation. Calf Veins: No evidence of thrombus. Normal compressibility and flow on color Doppler imaging. Superficial Great Saphenous Vein: No evidence of thrombus. Normal compressibility and flow on color Doppler imaging. Other Findings:  None. IMPRESSION: No evidence of deep venous thrombosis. Electronically Signed   By: Marcello Moores  Register   On: 11/22/2016 14:59   Dg Chest Port 1 View  Result Date: 11/20/2016 CLINICAL DATA:  Initial evaluation for acute shortness of breath. EXAM: PORTABLE CHEST 1 VIEW COMPARISON:  Prior radiograph from 06/13/2016. FINDINGS: Moderate cardiomegaly, stable. Mediastinal silhouette within normal limits. Lungs mildly hypoinflated. Diffuse pulmonary vascular congestion with  indistinctness of the interstitial markings, suggestive of mild diffuse pulmonary interstitial edema. Small left pleural effusion is suspected. No definite focal infiltrates. No pneumothorax. No acute osseous abnormality. IMPRESSION: 1. Cardiomegaly with diffuse pulmonary vascular congestion and indistinctness of the interstitial markings, suggestive of mild diffuse pulmonary interstitial edema. 2. Probable small left pleural effusion. Electronically Signed   By: Jeannine Boga M.D.   On: 11/20/2016 18:44  " Chest x-ray today did not show any new evidence of pathology with some mild interstitial edema  I personally reviewed the radiologic studies    ED  Course:  The patient was primarily treated for hypertension and peripheral edema. The patient was applied nitroglycerin paste and was given Lasix 40 mg IV with good urine output and some symptomatic improvement. His BNP is normal and there is no significant gathering of fluid on his chest x-ray and I felt the patient could be treated on an outpatient basis. He has had a 12 pound weight gain since he was discharged from the hospital. The patient was advised to keep track of his diet and decrease the salt in his diet was also increased on his torsemide as his renal function appears to be normal on today's visit. He also had an increase in his hydralazine from 25 mg 3 times a day to 50 mg 3 times a day. He was advised continue with follow-up with his cardiologist. Given the negative D-dimer test and a low clinical suspicion and I felt the patient did not require any further investigation for pulmonary embolism. His troponins are chronically elevated and I didn't feel there was a significant difference between today's troponins versus his previous slightly elevated levels. Clinical Course      Assessment:  Dyspnea on exertion Hypertension Peripheral edema Likely mild pulmonary edema versus pulmonary hypertension     Plan:  Outpatient Patient was advised to return immediately if condition worsens. Patient was advised to follow up with their primary care physician or other specialized physicians involved in their outpatient care. The patient and/or family member/power of attorney had laboratory results reviewed at the bedside. All questions and concerns were addressed and appropriate discharge instructions were distributed by the nursing staff.             Daymon Larsen, MD 12/15/16 [phone]

## 2016-12-15 NOTE — Discharge Instructions (Signed)
Please return immediately if condition worsens. Please contact her primary physician or the physician you were given for referral. If you have any specialist physicians involved in her treatment and plan please also contact them. Thank you for using [facility]. ° °

## 2016-12-15 NOTE — ED Triage Notes (Signed)
Pt states that he was seen a couple weeks ago for his CHF and was diagnosed with pneumonia, pt states that he has started to gain weight again, and feels like he has to fight to breathe, pt is always on O2 but forgot his portable tank at home, original O2 sat documented was on room air, pt placed on a portable tank in triage at his usual 2 L

## 2016-12-21 DIAGNOSIS — M1A09X0 Idiopathic chronic gout, multiple sites, without tophus (tophi): Secondary | ICD-10-CM | POA: Insufficient documentation

## 2017-01-03 ENCOUNTER — Ambulatory Visit: Payer: BLUE CROSS/BLUE SHIELD

## 2017-01-17 ENCOUNTER — Ambulatory Visit: Payer: Self-pay | Admitting: Family Medicine

## 2017-01-17 VITALS — BP 139/85 | HR 86 | Temp 97.6°F | Wt 297.4 lb

## 2017-01-17 DIAGNOSIS — K219 Gastro-esophageal reflux disease without esophagitis: Secondary | ICD-10-CM

## 2017-01-17 DIAGNOSIS — I5023 Acute on chronic systolic (congestive) heart failure: Secondary | ICD-10-CM

## 2017-01-17 DIAGNOSIS — I10 Essential (primary) hypertension: Secondary | ICD-10-CM | POA: Insufficient documentation

## 2017-01-17 DIAGNOSIS — M109 Gout, unspecified: Secondary | ICD-10-CM

## 2017-01-17 DIAGNOSIS — E119 Type 2 diabetes mellitus without complications: Secondary | ICD-10-CM

## 2017-01-17 DIAGNOSIS — L409 Psoriasis, unspecified: Secondary | ICD-10-CM | POA: Insufficient documentation

## 2017-01-17 LAB — GLUCOSE, POCT (MANUAL RESULT ENTRY): POC Glucose: 131 mg/dl — AB (ref 70–99)

## 2017-01-17 MED ORDER — FUROSEMIDE 40 MG PO TABS: ORAL_TABLET | ORAL | 2 refills | Status: DC

## 2017-01-17 MED ORDER — INSULIN GLARGINE 100 UNIT/ML ~~LOC~~ SOLN: 65.0000 [IU] | Freq: Every day | SUBCUTANEOUS | 11 refills | Status: DC

## 2017-01-17 MED ORDER — INSULIN ASPART 100 UNIT/ML ~~LOC~~ SOLN: 20.0000 [IU] | Freq: Three times a day (TID) | SUBCUTANEOUS | 11 refills | Status: DC

## 2017-01-17 NOTE — Progress Notes (Signed)
Subjective:     Patient ID: [name], male   DOB: 20-May-1966, 51 y.o.   MRN: [id number]  HPI  New pt with DM, CHF, HTN.  Was in [facility] with CHF flare 1/5-1/11/18.  Breathing has improved since using BiPAP.  Is on Lasix 40 MG bid.  Says his weight has increased.  His BS's were well controlled on Lantus qHS and Novolog TID though A1c was 9.6.   Review of Systems     Objective:   Physical Exam Op/conj clear CTA RRR Obese, NT/ND +2, trace edema    Assessment:     CHF DM HTN  GOUT GERD   Plan:     CHF.  Increase Lasix to 80 mg BID until swelling, breathing and weight improve.  Stay on current BP meds. Check BNP. DM.  Lantus 70 units qd and Novolog 20 units with meals. HTN.  Stay on current meds.  Check CBC and Met B. GERD. Stay on Omeprazole daily. Check Mg and B12. Gout. stay on allopurinol daily.  Recheck in 1 month.

## 2017-01-21 ENCOUNTER — Emergency Department: Payer: Medicaid Other

## 2017-01-21 ENCOUNTER — Emergency Department
Admission: EM | Admit: 2017-01-21 | Discharge: 2017-01-21 | Disposition: A | Discharge status: Home / Self Care | Payer: Medicaid Other | Attending: Emergency Medicine | Admitting: Emergency Medicine

## 2017-01-21 ENCOUNTER — Encounter: Payer: Self-pay | Admitting: Intensive Care

## 2017-01-21 ENCOUNTER — Ambulatory Visit: Payer: Self-pay | Admitting: Pharmacy Technician

## 2017-01-21 DIAGNOSIS — L03115 Cellulitis of right lower limb: Secondary | ICD-10-CM | POA: Diagnosis not present

## 2017-01-21 DIAGNOSIS — Z79899 Other long term (current) drug therapy: Secondary | ICD-10-CM

## 2017-01-21 DIAGNOSIS — E119 Type 2 diabetes mellitus without complications: Secondary | ICD-10-CM | POA: Insufficient documentation

## 2017-01-21 DIAGNOSIS — I5023 Acute on chronic systolic (congestive) heart failure: Secondary | ICD-10-CM | POA: Insufficient documentation

## 2017-01-21 DIAGNOSIS — Z794 Long term (current) use of insulin: Secondary | ICD-10-CM | POA: Diagnosis not present

## 2017-01-21 DIAGNOSIS — I110 Hypertensive heart disease with heart failure: Secondary | ICD-10-CM | POA: Insufficient documentation

## 2017-01-21 DIAGNOSIS — Z7982 Long term (current) use of aspirin: Secondary | ICD-10-CM | POA: Insufficient documentation

## 2017-01-21 DIAGNOSIS — R609 Edema, unspecified: Secondary | ICD-10-CM

## 2017-01-21 DIAGNOSIS — M7989 Other specified soft tissue disorders: Secondary | ICD-10-CM | POA: Diagnosis present

## 2017-01-21 LAB — BASIC METABOLIC PANEL
Anion gap: 7 (ref 5–15)
BUN: 26 mg/dL — ABNORMAL HIGH (ref 6–20)
CO2: 38 mmol/L — ABNORMAL HIGH (ref 22–32)
Calcium: 9.1 mg/dL (ref 8.9–10.3)
Chloride: 96 mmol/L — ABNORMAL LOW (ref 101–111)
Creatinine, Ser: 1.64 mg/dL — ABNORMAL HIGH (ref 0.61–1.24)
GFR calc Af Amer: 56 mL/min — ABNORMAL LOW (ref >60)
GFR calc non Af Amer: 48 mL/min — ABNORMAL LOW (ref >60)
Glucose, Bld: 195 mg/dL — ABNORMAL HIGH (ref 65–99)
Potassium: 4.1 mmol/L (ref 3.5–5.1)
Sodium: 141 mmol/L (ref 135–145)

## 2017-01-21 LAB — CBC
HCT: 38.5 % — ABNORMAL LOW (ref 40.0–52.0)
Hemoglobin: 13 g/dL (ref 13.0–18.0)
MCH: 26.3 pg (ref 26.0–34.0)
MCHC: 33.7 g/dL (ref 32.0–36.0)
MCV: 78 fL — ABNORMAL LOW (ref 80.0–100.0)
Platelets: 177 10*3/uL (ref 150–440)
RBC: 4.93 MIL/uL (ref 4.40–5.90)
RDW: 19 % — ABNORMAL HIGH (ref 11.5–14.5)
WBC: 10.4 10*3/uL (ref 3.8–10.6)

## 2017-01-21 LAB — TROPONIN I: Troponin I: 0.03 ng/mL (ref <0.03)

## 2017-01-21 MED ORDER — ACETAMINOPHEN 325 MG PO TABS
650.0000 mg | ORAL_TABLET | Freq: Once | ORAL | Status: AC
  Administered 2017-01-21: 650 mg via ORAL
  Filled 2017-01-21: qty 2

## 2017-01-21 MED ORDER — VANCOMYCIN HCL IN DEXTROSE 1-5 GM/200ML-% IV SOLN
1000.0000 mg | Freq: Once | INTRAVENOUS | Status: AC
  Administered 2017-01-21: 1000 mg via INTRAVENOUS
  Filled 2017-01-21: qty 200

## 2017-01-21 MED ORDER — CEPHALEXIN 500 MG PO CAPS: 500.0000 mg | ORAL_CAPSULE | Freq: Four times a day (QID) | ORAL | 0 refills | Status: AC

## 2017-01-21 NOTE — Discharge Instructions (Signed)
Please seek medical attention for any high fevers, chest pain, shortness of breath, change in behavior, persistent vomiting, bloody stool or any other new or concerning symptoms.  

## 2017-01-21 NOTE — ED Provider Notes (Signed)
[facility] Emergency Department Provider Note  _______________________________________   I have reviewed the triage vital signs and the nursing notes.   HISTORY  Chief Complaint Leg Swelling   History limited by: Not Limited   HPI [name] is a 51 y.o. male who presents to the emergency department today because of concerns for redness pain and swelling to his right lower leg. The patient states that the symptoms have been present for the past week. He was seen in the emergency Department last month for bilateral leg swelling. He states that he did increase his Lasix and has noticed some improvement however roughly 1 week ago the patient noticed that redness started in that leg. Patient has a history of psoriasis. He states however that this is more painful and swollen than normal. He denies any fevers. He denied any nausea or vomiting to me. Denied any chest pain or shortness of breath to me.   Past Medical History:  Diagnosis Date  . Bell's palsy   . CHF (congestive heart failure) ([place])   . Diabetes mellitus without complication ([place])   . Gout   . Hypercholesteremia   . Hypertension   . [facility]onsust[facility]cardia[facility])     Patient Active Problem List   Diagnosis Date Noted  . Benign essential HTN 01/17/2017  . Gout 01/17/2017  . Psoriasis 01/17/2017  . Acute on chronic systolic CHF (congestive heart failure), NYHA class 3 ([place]) 11/20/2016  . Acute respiratory failure with hypoxia ([place]) 11/20/2016  . Acute on chronic systolic CHF (congestive heart failure) ([place]) 11/20/2016  . Controlled type 2 diabetes with leg or foot ulcer 06/25/2016    History reviewed. No pertinent surgical history.  Prior to Admission medications   Medication Sig Start Date End Date Taking? Authorizing Provider  albuterol (PROVENTIL HFA;VENTOLIN HFA) 108 (90 Base) MCG/ACT inhaler Inhale 2 puffs into the lungs every 4 (four) hours as needed for  wheezing or shortness of breath. 11/29/16   Bettey Costa, MD  allopurinol (ZYLOPRIM) 100 MG tablet Take 2 tablets (200 mg total) by mouth daily. 11/29/16   Bettey Costa, MD  aspirin EC 81 MG tablet Take 1 tablet (81 mg total) by mouth daily. 11/29/16   Bettey Costa, MD  atorvastatin (LIPITOR) 40 MG tablet Take 1 tablet (40 mg total) by mouth at bedtime. 11/29/16   Bettey Costa, MD  carvedilol (COREG) 25 MG tablet Take 1 tablet (25 mg total) by mouth every 12 (twelve) hours. 11/29/16   Bettey Costa, MD  clobetasol ointment (TEMOVATE) 3.53 % Apply 1 application topically 2 (two) times daily. 11/29/16   Bettey Costa, MD  furosemide (LASIX) 40 MG tablet Take 1-2 tabs 01/17/17   Juluis Pitch, MD  gabapentin (NEURONTIN) 300 MG capsule Take 1 capsule (300 mg total) by mouth 3 (three) times daily. 11/29/16   Bettey Costa, MD  hydrALAZINE (APRESOLINE) 50 MG tablet Take 1 tablet (50 mg total) by mouth 3 (three) times daily. 12/15/16   Daymon Larsen, MD  insulin aspart (NOVOLOG) 100 UNIT/ML injection Inject 20-35 Units into the skin 3 (three) times daily before meals. 01/17/17   Juluis Pitch, MD  insulin detemir (LEVEMIR) 100 UNIT/ML injection Inject 0.57 mLs (57 Units total) into the skin at bedtime. 11/29/16   Sital Mody, MD  insulin glargine (LANTUS) 100 UNIT/ML injection Inject 0.65-0.8 mLs (65-80 Units total) into the skin at bedtime. 01/17/17   Juluis Pitch, MD  insulin glulisine (APIDRA) 100 UNIT/ML injection Inject 0.15 mLs (  15 Units total) into the skin 3 (three) times daily with meals. 11/29/16   Bettey Costa, MD  isosorbide dinitrate (ISORDIL) 20 MG tablet Take 1 tablet (20 mg total) by mouth 3 (three) times daily. 11/29/16   Bettey Costa, MD  levofloxacin (LEVAQUIN) 500 MG tablet Take 1 tablet (500 mg total) by mouth daily. Patient not taking: Reported on 12/15/2016 11/29/16   Bettey Costa, MD  magnesium oxide (MAG-OX) 400 (241.3 Mg) MG tablet Take 2 tablets (800 mg total) by mouth 2 (two) times daily. 11/29/16    Bettey Costa, MD  omeprazole (PRILOSEC) 20 MG capsule Take 1 capsule (20 mg total) by mouth 2 (two) times daily before a meal. 11/29/16   Bettey Costa, MD    Allergies Patient has no known allergies.  Family History  Problem Relation Age of Onset  . Heart failure Mother   . Kidney failure Brother     Social History Social History  Substance Use Topics  . Smoking status: Never Smoker  . Smokeless tobacco: Never Used  . Alcohol use Yes     Comment: very rarely    Review of Systems  Constitutional: Negative for fever. Cardiovascular: Negative for chest pain. Respiratory: Negative for shortness of breath. Gastrointestinal: Negative for abdominal pain, vomiting and diarrhea. Musculoskeletal: Right lower leg swelling and pain. Skin: Positive for redness to right lower leg Neurological: Negative for headaches, focal weakness or numbness.  10-point ROS otherwise negative.  ____________________________________________   PHYSICAL EXAM:  VITAL SIGNS: ED Triage Vitals  Enc Vitals Group     BP 01/21/17 1811 111/62     Pulse Rate 01/21/17 1811 83     Resp 01/21/17 1811 16     Temp 01/21/17 1811 98.2 F (36.8 C)     Temp Source 01/21/17 1811 Oral     SpO2 01/21/17 1811 95 %     Weight 01/21/17 1812 285 lb (129.3 kg)     Height 01/21/17 1812 5\' 3"  (1.6 m)     Head Circumference --      Peak Flow --      Pain Score 01/21/17 1825 10   Constitutional: Alert and oriented. Well appearing and in no distress. Eyes: Conjunctivae are normal. Normal extraocular movements. ENT   Head: Normocephalic and atraumatic.   Nose: No congestion/rhinnorhea.   Mouth/Throat: Mucous membranes are moist.   Neck: No stridor. Hematological/Lymphatic/Immunilogical: No cervical lymphadenopathy. Cardiovascular: Normal rate, regular rhythm.  No murmurs, rubs, or gallops.  Respiratory: Normal respiratory effort without tachypnea nor retractions. Breath sounds are clear and equal bilaterally.  No wheezes/rales/rhonchi. Gastrointestinal: Soft and non tender. No rebound. No guarding.  Genitourinary: Deferred Musculoskeletal: Normal range of motion in all extremities. Bilateral edema, right greater than left. Right lower extremity with some warmth and erythema.  Neurologic:  Normal speech and language. No gross focal neurologic deficits are appreciated.  Skin:  Diffuse rash over whole body consistent with psoriasis. Right lower leg with erythema Psychiatric: Mood and affect are normal. Speech and behavior are normal. Patient exhibits appropriate insight and judgment.  ____________________________________________    LABS (pertinent positives/negatives)  Labs Reviewed  BASIC METABOLIC PANEL - Abnormal; Notable for the following:       Result Value   Chloride 96 (*)    CO2 38 (*)    Glucose, Bld 195 (*)    BUN 26 (*)    Creatinine, Ser 1.64 (*)    GFR calc non Af Amer 48 (*)    GFR calc Af Wyvonnia Lora  56 (*)    All other components within normal limits  CBC - Abnormal; Notable for the following:    HCT 38.5 (*)    MCV 78.0 (*)    RDW 19.0 (*)    All other components within normal limits  TROPONIN I - Abnormal; Notable for the following:    Troponin I 0.03 (*)    All other components within normal limits     ____________________________________________   EKG  I, Nance Pear, attending physician, personally viewed and interpreted this EKG  EKG Time: 1838 Rate: 82 Rhythm: normal sinus rhythm Axis: left axis deviation Intervals: qtc 497 QRS: narrow ST changes: no st elevation Impression: abnormal ekg  ____________________________________________    RADIOLOGY  CXR IMPRESSION: Cardiomegaly with vascular congestion.  Right tib fib IMPRESSION:  Moderate-to-marked soft tissue swelling. No acute osseous  abnormality.     ____________________________________________   PROCEDURES  Procedures  ____________________________________________   INITIAL  IMPRESSION / ASSESSMENT AND PLAN / ED COURSE  Pertinent labs & imaging results that were available during my care of the patient were reviewed by me and considered in my medical decision making (see chart for details).  Patient presented to the emergency department today because of concerns for right lower leg swelling erythema and warmth. Exam right lower extremity consistent with cellulitis. An x-ray was obtained given history of diabetes which did not show any subcutaneous gas or osseous involvement. Additionally no fevers nausea vomiting or leukocytosis. At this point patient will be given a dose of IV antibiotics here and will be discharged with further antibiotics.  ____________________________________________   FINAL CLINICAL IMPRESSION(S) / ED DIAGNOSES  Final diagnoses:  Cellulitis of right lower extremity  Peripheral edema     Note: This dictation was prepared with Dragon dictation. Any transcriptional errors that result from this process are unintentional     Nance Pear, MD 01/21/17 2027

## 2017-01-21 NOTE — ED Triage Notes (Addendum)
Patient presents to ER with cellulitis to R lower leg and swelling X1 week. Pt c/o R leg pain/ tightness when trying to ambulated or bend knee. Reports SOB progressing over the last couple of days. Redness noted and warm to touch. HX CHF and diabetes. Wears 2L O2 continuous. A&O x3 Denies chest pain at this time.

## 2017-01-22 ENCOUNTER — Other Ambulatory Visit: Payer: Self-pay | Admitting: Adult Health Nurse Practitioner

## 2017-01-22 MED ORDER — INSULIN DETEMIR 100 UNIT/ML ~~LOC~~ SOLN
57.0000 [IU] | Freq: Every day | SUBCUTANEOUS | 0 refills | Status: DC
Start: 2017-01-22 — End: 2017-02-05

## 2017-01-22 MED ORDER — KETOROLAC TROMETHAMINE 30 MG/ML IJ SOLN
INTRAMUSCULAR | Status: AC
  Filled 2017-01-22: qty 1

## 2017-01-22 MED ORDER — ONDANSETRON HCL 4 MG/2ML IJ SOLN
INTRAMUSCULAR | Status: AC
  Filled 2017-01-22: qty 2

## 2017-01-30 ENCOUNTER — Other Ambulatory Visit: Payer: Self-pay | Admitting: Ophthalmology

## 2017-01-30 ENCOUNTER — Ambulatory Visit: Payer: Self-pay | Admitting: Ophthalmology

## 2017-01-30 ENCOUNTER — Other Ambulatory Visit: Payer: Self-pay

## 2017-01-30 DIAGNOSIS — I10 Essential (primary) hypertension: Secondary | ICD-10-CM

## 2017-01-30 LAB — HM DIABETES EYE EXAM

## 2017-01-31 ENCOUNTER — Other Ambulatory Visit: Payer: Self-pay

## 2017-01-31 DIAGNOSIS — E785 Hyperlipidemia, unspecified: Secondary | ICD-10-CM | POA: Insufficient documentation

## 2017-01-31 DIAGNOSIS — I10 Essential (primary) hypertension: Secondary | ICD-10-CM

## 2017-01-31 DIAGNOSIS — I2510 Atherosclerotic heart disease of native coronary artery without angina pectoris: Secondary | ICD-10-CM | POA: Insufficient documentation

## 2017-01-31 NOTE — Progress Notes (Signed)
Met with patient completed financial assistance application for [place] due to recent hospital visit.  Patient agreed to be responsible for gathering financial information and forwarding to appropriate department in [place].    Completed [facility] application and contract.  Patient agreed to all terms of the [facility] contract.    Patient indicated that he no longer is insured.  According to [facility] patient has insurance coverage with [employer].  Patient stated that he has been unable to obtain letter indicating that he is no longer insured.  Called [employer] with patient present.  Lillian verbally verified that patient's insurance ended on October 16, 2016.    Patient also indicated that he was at risk for homelessness.  Contacted [employer].  Housing specialist at [employer] is working with patient to coordinate subsidized housing that does not require income.  Patient to bring in letter once approved for program.  Also, provided patient with other community resource material based on hisparticular needs.     Patient approved to receive medication assistance for 2018 dependent upon there are no changes in income or insurance.  Lantus and Novolog PAP Applications completed with patient.  Forwarded to [facility] for signature.  Upon receipt of signed applications from provider, Lantus Prescription Application will be submitted to [employer] and Novolog Prescription Application will be submitted to [employer].  Betty J. Kluttz Care Manager [facility]  

## 2017-02-01 LAB — SPECIMEN STATUS REPORT

## 2017-02-02 LAB — CBC WITH DIFFERENTIAL
Basophils Absolute: 0 10*3/uL (ref 0.0–0.2)
Basos: 0 %
EOS (ABSOLUTE): 0.2 10*3/uL (ref 0.0–0.4)
Eos: 3 %
Hematocrit: 41.5 % (ref 37.5–51.0)
Hemoglobin: 12.8 g/dL — ABNORMAL LOW (ref 13.0–17.7)
Immature Grans (Abs): 0 10*3/uL (ref 0.0–0.1)
Immature Granulocytes: 0 %
Lymphocytes Absolute: 1.5 10*3/uL (ref 0.7–3.1)
Lymphs: 16 %
MCH: 25 pg — ABNORMAL LOW (ref 26.6–33.0)
MCHC: 30.8 g/dL — ABNORMAL LOW (ref 31.5–35.7)
MCV: 81 fL (ref 79–97)
Monocytes Absolute: 0.7 10*3/uL (ref 0.1–0.9)
Monocytes: 7 %
Neutrophils Absolute: 6.9 10*3/uL (ref 1.4–7.0)
Neutrophils: 74 %
RBC: 5.12 x10E6/uL (ref 4.14–5.80)
RDW: 17.2 % — ABNORMAL HIGH (ref 12.3–15.4)
WBC: 9.3 10*3/uL (ref 3.4–10.8)

## 2017-02-02 LAB — COMPREHENSIVE METABOLIC PANEL
ALT: 30 IU/L (ref 0–44)
AST: 18 IU/L (ref 0–40)
Albumin/Globulin Ratio: 1.3 (ref 1.2–2.2)
Albumin: 3.5 g/dL (ref 3.5–5.5)
Alkaline Phosphatase: 132 IU/L — ABNORMAL HIGH (ref 39–117)
BUN/Creatinine Ratio: 15 (ref 9–20)
BUN: 22 mg/dL (ref 6–24)
Bilirubin Total: <0.2 mg/dL (ref 0.0–1.2)
CO2: 27 mmol/L (ref 18–29)
Calcium: 8.5 mg/dL — ABNORMAL LOW (ref 8.7–10.2)
Chloride: 99 mmol/L (ref 96–106)
Creatinine, Ser: 1.43 mg/dL — ABNORMAL HIGH (ref 0.76–1.27)
GFR calc Af Amer: 66 mL/min/{1.73_m2} (ref >59)
GFR calc non Af Amer: 57 mL/min/{1.73_m2} — ABNORMAL LOW (ref >59)
Globulin, Total: 2.8 g/dL (ref 1.5–4.5)
Glucose: 267 mg/dL — ABNORMAL HIGH (ref 65–99)
Potassium: 4.1 mmol/L (ref 3.5–5.2)
Sodium: 142 mmol/L (ref 134–144)
Total Protein: 6.3 g/dL (ref 6.0–8.5)

## 2017-02-02 LAB — MAGNESIUM: Magnesium: 1.6 mg/dL (ref 1.6–2.3)

## 2017-02-02 LAB — BRAIN NATRIURETIC PEPTIDE: BNP: 7.6 pg/mL (ref 0.0–100.0)

## 2017-02-02 LAB — URIC ACID: Uric Acid: 9.1 mg/dL — ABNORMAL HIGH (ref 3.7–8.6)

## 2017-02-04 LAB — SPECIMEN STATUS REPORT

## 2017-02-04 LAB — VITAMIN B12: Vitamin B-12: 519 pg/mL (ref 232–1245)

## 2017-02-05 ENCOUNTER — Other Ambulatory Visit: Payer: Self-pay

## 2017-02-05 DIAGNOSIS — E118 Type 2 diabetes mellitus with unspecified complications: Secondary | ICD-10-CM

## 2017-02-05 MED ORDER — INSULIN DETEMIR 100 UNIT/ML ~~LOC~~ SOLN: 57.0000 [IU] | Freq: Every day | SUBCUTANEOUS | 0 refills | Status: DC

## 2017-02-05 NOTE — Telephone Encounter (Signed)
Received PAP application from [facility] for Lantus, Novolog placed for provider to sign.

## 2017-02-07 ENCOUNTER — Ambulatory Visit: Payer: Self-pay | Admitting: Nurse Practitioner

## 2017-02-07 ENCOUNTER — Other Ambulatory Visit: Payer: Self-pay | Admitting: Adult Health Nurse Practitioner

## 2017-02-07 VITALS — BP 116/68 | HR 81 | Temp 99.0°F | Wt 298.0 lb

## 2017-02-07 DIAGNOSIS — E119 Type 2 diabetes mellitus without complications: Secondary | ICD-10-CM

## 2017-02-07 DIAGNOSIS — Z794 Long term (current) use of insulin: Principal | ICD-10-CM

## 2017-02-07 DIAGNOSIS — L409 Psoriasis, unspecified: Secondary | ICD-10-CM

## 2017-02-07 LAB — GLUCOSE, POCT (MANUAL RESULT ENTRY): POC Glucose: 181 mg/dl — AB (ref 70–99)

## 2017-02-07 MED ORDER — ALBUTEROL SULFATE HFA 108 (90 BASE) MCG/ACT IN AERS: 2.0000 | INHALATION_SPRAY | RESPIRATORY_TRACT | 0 refills | Status: DC | PRN

## 2017-02-07 MED ORDER — ASPIRIN EC 81 MG PO TBEC: 81.0000 mg | DELAYED_RELEASE_TABLET | Freq: Every day | ORAL | 0 refills | Status: DC

## 2017-02-07 MED ORDER — INSULIN GLARGINE 100 UNIT/ML ~~LOC~~ SOLN: 65.0000 [IU] | Freq: Every day | SUBCUTANEOUS | 11 refills | Status: DC

## 2017-02-07 MED ORDER — ALLOPURINOL 100 MG PO TABS: 200.0000 mg | ORAL_TABLET | Freq: Every day | ORAL | 0 refills | Status: DC

## 2017-02-07 MED ORDER — GABAPENTIN 300 MG PO CAPS
300.0000 mg | ORAL_CAPSULE | Freq: Three times a day (TID) | ORAL | 0 refills | Status: DC
Start: 2017-02-07 — End: 2017-02-14

## 2017-02-07 MED ORDER — TRIAMCINOLONE ACETONIDE 0.5 % EX OINT: TOPICAL_OINTMENT | Freq: Two times a day (BID) | CUTANEOUS | Status: DC

## 2017-02-07 NOTE — Progress Notes (Signed)
MAJOR CONCERN IS PSORASIS OVER ARMS AND LEGS  NEEDS OTHER MEDS REFILLED.    HAS CHF,     EXAM:   PSORIASIS EXTENSIVE OVER ARMS, LEGS, AND BACK   PLAN:    WILL PRESCRIBE TRIAMCINOLONE 0.5%, UNG AND PT MAY USE BID AS NEEDED  WITH CHF, FOLLOWED AT [facility], EVERY 2 WEEKS.    WILL RENEW HIS MEDS

## 2017-02-08 ENCOUNTER — Other Ambulatory Visit: Payer: Self-pay

## 2017-02-08 ENCOUNTER — Telehealth: Payer: Self-pay

## 2017-02-08 DIAGNOSIS — L409 Psoriasis, unspecified: Secondary | ICD-10-CM

## 2017-02-08 MED ORDER — TRIAMCINOLONE ACETONIDE 0.5 % EX OINT: 1.0000 "application " | TOPICAL_OINTMENT | Freq: Two times a day (BID) | CUTANEOUS | 1 refills | Status: DC

## 2017-02-08 NOTE — Telephone Encounter (Signed)
rx resent  

## 2017-02-14 ENCOUNTER — Ambulatory Visit: Payer: Self-pay | Admitting: Family Medicine

## 2017-02-14 VITALS — BP 126/82 | HR 82 | Temp 97.9°F | Wt 296.6 lb

## 2017-02-14 DIAGNOSIS — E7800 Pure hypercholesterolemia, unspecified: Secondary | ICD-10-CM

## 2017-02-14 DIAGNOSIS — E119 Type 2 diabetes mellitus without complications: Secondary | ICD-10-CM

## 2017-02-14 DIAGNOSIS — I10 Essential (primary) hypertension: Secondary | ICD-10-CM

## 2017-02-14 DIAGNOSIS — I429 Cardiomyopathy, unspecified: Secondary | ICD-10-CM

## 2017-02-14 DIAGNOSIS — M1A09X0 Idiopathic chronic gout, multiple sites, without tophus (tophi): Secondary | ICD-10-CM

## 2017-02-14 DIAGNOSIS — L409 Psoriasis, unspecified: Secondary | ICD-10-CM

## 2017-02-14 DIAGNOSIS — E1142 Type 2 diabetes mellitus with diabetic polyneuropathy: Secondary | ICD-10-CM

## 2017-02-14 LAB — GLUCOSE, POCT (MANUAL RESULT ENTRY): POC Glucose: 260 mg/dl — AB (ref 70–99)

## 2017-02-14 MED ORDER — FUROSEMIDE 40 MG PO TABS: ORAL_TABLET | ORAL | 2 refills | Status: DC

## 2017-02-14 MED ORDER — ATORVASTATIN CALCIUM 40 MG PO TABS: 40.0000 mg | ORAL_TABLET | Freq: Every day | ORAL | 3 refills | Status: DC

## 2017-02-14 MED ORDER — CARVEDILOL 25 MG PO TABS: 25.0000 mg | ORAL_TABLET | Freq: Two times a day (BID) | ORAL | 3 refills | Status: DC

## 2017-02-14 MED ORDER — TRIAMCINOLONE ACETONIDE 0.1 % EX CREA: 1.0000 "application " | TOPICAL_CREAM | Freq: Two times a day (BID) | CUTANEOUS | 2 refills | Status: DC

## 2017-02-14 MED ORDER — HYDRALAZINE HCL 50 MG PO TABS: 50.0000 mg | ORAL_TABLET | Freq: Three times a day (TID) | ORAL | 2 refills | Status: DC

## 2017-02-14 MED ORDER — ALLOPURINOL 300 MG PO TABS: 300.0000 mg | ORAL_TABLET | Freq: Every day | ORAL | 4 refills | Status: DC

## 2017-02-14 MED ORDER — OMEPRAZOLE 20 MG PO CPDR: 20.0000 mg | DELAYED_RELEASE_CAPSULE | Freq: Two times a day (BID) | ORAL | 2 refills | Status: DC

## 2017-02-14 MED ORDER — ISOSORBIDE DINITRATE 20 MG PO TABS: 20.0000 mg | ORAL_TABLET | Freq: Three times a day (TID) | ORAL | 2 refills | Status: DC

## 2017-02-14 MED ORDER — GABAPENTIN 300 MG PO CAPS: 600.0000 mg | ORAL_CAPSULE | Freq: Three times a day (TID) | ORAL | 3 refills | Status: DC

## 2017-02-14 NOTE — Assessment & Plan Note (Signed)
The current medical regimen is effective;  continue present plan and medications.  

## 2017-02-14 NOTE — Assessment & Plan Note (Signed)
Increase allopurinol to therapeutic dose of 300 mg as renal function returned to normal as of 2 weeks ago.

## 2017-02-14 NOTE — Addendum Note (Signed)
Addended byGolden Pop on: 02/14/2017 06:52 PM   Modules accepted: Orders

## 2017-02-14 NOTE — Progress Notes (Signed)
BP 126/82   Pulse 82   Temp 97.9 F (36.6 C)   Wt 296 lb 9.6 oz (134.5 kg)   BMI 52.54 kg/m    Subjective:    Patient ID: [name], male    DOB: July 14, 1966, 51 y.o.   MRN: [id number]  HPI: [name] is a 51 y.o. male  Chief Complaint  Patient presents with  . Follow-up    blood work and md last week  Patient follow-up heart failure doing much better able to almost lay flat PND improved distal edema improved. Patient spells still somewhat upset no blood in stool or urine. Reviewed medications patient stable some differences on Lasix versus other medication will continue furosemide as that's what's available here in this clinic. Patient's biggest concern is given triamcinolone for his right assist. Was given small amounts needs large amount as he has extensive psoriasis covering his body  Diabetes doing okay administering insulin without problems. Has refills  Relevant past medical, surgical, family and social history reviewed and updated as indicated. Interim medical history since our last visit reviewed. Allergies and medications reviewed and updated.  Review of Systems  Constitutional: Negative.   Respiratory: Positive for wheezing.        Using oxygen  Cardiovascular: Positive for leg swelling. Negative for chest pain and palpitations.    Per HPI unless specifically indicated above     Objective:    BP 126/82   Pulse 82   Temp 97.9 F (36.6 C)   Wt 296 lb 9.6 oz (134.5 kg)   BMI 52.54 kg/m   Wt Readings from Last 3 Encounters:  02/14/17 296 lb 9.6 oz (134.5 kg)  02/07/17 298 lb (135.2 kg)  01/21/17 285 lb (129.3 kg)    Physical Exam  Constitutional: He is oriented to person, place, and time. He appears well-developed and well-nourished.  HENT:  Head: Normocephalic and atraumatic.  Eyes: Conjunctivae and EOM are normal.  Neck: Normal range of motion.  Cardiovascular: Normal rate, regular rhythm and normal heart sounds.    Pulmonary/Chest: Effort normal and breath sounds normal.  Musculoskeletal: Normal range of motion.  Neurological: He is alert and oriented to person, place, and time.  Skin: No erythema.  Right ankle 1+ edema left ankle trace  Psychiatric: He has a normal mood and affect. His behavior is normal. Judgment and thought content normal.    Results for orders placed or performed in visit on 02/14/17  POCT Glucose (CBG)  Result Value Ref Range   POC Glucose 260 (A) 70 - 99 mg/dl      Assessment & Plan:   Problem List Items Addressed This Visit      Cardiovascular and Mediastinum   Essential hypertension    The current medical regimen is effective;  continue present plan and medications.       Relevant Medications   isosorbide dinitrate (ISORDIL) 20 MG tablet   hydrALAZINE (APRESOLINE) 50 MG tablet   furosemide (LASIX) 40 MG tablet   carvedilol (COREG) 25 MG tablet   atorvastatin (LIPITOR) 40 MG tablet   Cardiomyopathy (HCC)    The current medical regimen is effective;  continue present plan and medications.       Relevant Medications   isosorbide dinitrate (ISORDIL) 20 MG tablet   hydrALAZINE (APRESOLINE) 50 MG tablet   furosemide (LASIX) 40 MG tablet   carvedilol (COREG) 25 MG tablet   atorvastatin (LIPITOR) 40 MG tablet     Endocrine   DM type  2 with diabetic peripheral neuropathy (HCC)    Continue insulin adjusting as needed we will check hemoglobin A1c today      Relevant Medications   gabapentin (NEURONTIN) 300 MG capsule   atorvastatin (LIPITOR) 40 MG tablet     Musculoskeletal and Integument   Psoriasis   Chronic gout of multiple sites    Increase allopurinol to therapeutic dose of 300 mg as renal function returned to normal as of 2 weeks ago.      Relevant Medications   allopurinol (ZYLOPRIM) 300 MG tablet     Other   Hyperlipidemia, unspecified    The current medical regimen is effective;  continue present plan and medications.       Relevant  Medications   isosorbide dinitrate (ISORDIL) 20 MG tablet   hydrALAZINE (APRESOLINE) 50 MG tablet   furosemide (LASIX) 40 MG tablet   carvedilol (COREG) 25 MG tablet   atorvastatin (LIPITOR) 40 MG tablet    Other Visit Diagnoses    Diabetes mellitus without complication ([place])    -  Primary   Relevant Medications   atorvastatin (LIPITOR) 40 MG tablet   Other Relevant Orders   POCT Glucose (CBG) (Completed)       Follow up plan: Return in about 4 weeks (around 03/14/2017) for BMP.

## 2017-02-14 NOTE — Assessment & Plan Note (Signed)
Continue insulin adjusting as needed we will check hemoglobin A1c today

## 2017-02-15 LAB — HGB A1C W/O EAG: Hgb A1c MFr Bld: 9.1 % — ABNORMAL HIGH (ref 4.8–5.6)

## 2017-02-19 ENCOUNTER — Telehealth: Payer: Self-pay

## 2017-02-19 NOTE — Telephone Encounter (Signed)
Received PAP application from [facility] for Proventil placed for provider to sign.

## 2017-02-28 ENCOUNTER — Ambulatory Visit: Payer: Self-pay | Admitting: Adult Health Nurse Practitioner

## 2017-02-28 VITALS — BP 141/84 | HR 90 | Temp 98.0°F | Wt 298.3 lb

## 2017-02-28 DIAGNOSIS — E119 Type 2 diabetes mellitus without complications: Secondary | ICD-10-CM | POA: Insufficient documentation

## 2017-02-28 LAB — GLUCOSE, POCT (MANUAL RESULT ENTRY): POC Glucose: 207 mg/dl — AB (ref 70–99)

## 2017-02-28 MED ORDER — INSULIN ASPART 100 UNIT/ML ~~LOC~~ SOLN: 20.0000 [IU] | Freq: Three times a day (TID) | SUBCUTANEOUS | 11 refills | Status: DC

## 2017-02-28 MED ORDER — INSULIN GLARGINE 100 UNIT/ML ~~LOC~~ SOLN: 65.0000 [IU] | Freq: Every day | SUBCUTANEOUS | 11 refills | Status: DC

## 2017-02-28 NOTE — Progress Notes (Signed)
Patient: [name] Male    DOB: Jul 15, 1966   51 y.o.   MRN: [id number] Visit Date: 02/28/2017  Today's Provider: Staci Acosta, NP   Chief Complaint  Patient presents with  . Follow-up  . Urinary Frequency  . Abdominal fluid retention   Subjective:    HPI  Concerned about his fluid status.  Saw cardiology at [facility] last week for heart failure.  Pt on fluid and sodium restriction.  Should continue daily weights.  Started on Torsemide 40mg  BID with mag-oxide.   Pt states that he needs a podiatrist because he needs his toenails clipped.     Taking Lantus 70 units daily.  Novolog 20-25 units TID.  CBGs average 160-200.   No Known Allergies Previous Medications   ALBUTEROL (PROVENTIL HFA;VENTOLIN HFA) 108 (90 BASE) MCG/ACT INHALER    Inhale 2 puffs into the lungs every 4 (four) hours as needed for wheezing or shortness of breath.   ALLOPURINOL (ZYLOPRIM) 300 MG TABLET    Take 1 tablet (300 mg total) by mouth daily.   ASPIRIN EC 81 MG TABLET    Take 1 tablet (81 mg total) by mouth daily.   ATORVASTATIN (LIPITOR) 40 MG TABLET    Take 1 tablet (40 mg total) by mouth at bedtime.   CARVEDILOL (COREG) 25 MG TABLET    Take 1 tablet (25 mg total) by mouth every 12 (twelve) hours.   FUROSEMIDE (LASIX) 40 MG TABLET    Take 1-2 tabs morning and noon   GABAPENTIN (NEURONTIN) 300 MG CAPSULE    Take 2 capsules (600 mg total) by mouth 3 (three) times daily.   HYDRALAZINE (APRESOLINE) 50 MG TABLET    Take 1 tablet (50 mg total) by mouth 3 (three) times daily.   INSULIN ASPART (NOVOLOG) 100 UNIT/ML INJECTION    Inject 20-35 Units into the skin 3 (three) times daily before meals.   INSULIN GLARGINE (LANTUS) 100 UNIT/ML INJECTION    Inject 0.65-0.8 mLs (65-80 Units total) into the skin at bedtime.   ISOSORBIDE DINITRATE (ISORDIL) 20 MG TABLET    Take 1 tablet (20 mg total) by mouth 3 (three) times daily.   OMEPRAZOLE (PRILOSEC) 20 MG CAPSULE    Take 1 capsule (20 mg total) by mouth 2 (two)  times daily before a meal.   TRIAMCINOLONE CREAM (KENALOG) 0.1 %    Apply 1 application topically 2 (two) times daily.   TRIAMCINOLONE OINTMENT (KENALOG) 0.5 %    Apply 1 application topically 2 (two) times daily.    Review of Systems  All other systems reviewed and are negative.   Social History  Substance Use Topics  . Smoking status: Never Smoker  . Smokeless tobacco: Never Used  . Alcohol use Yes     Comment: very rarely   Objective:   BP (!) 141/84   Pulse 90   Temp 98 F (36.7 C)   Wt 298 lb 4.8 oz (135.3 kg)   BMI 52.84 kg/m   Physical Exam  Constitutional: He is oriented to person, place, and time. He appears well-developed and well-nourished.  HENT:  Head: Normocephalic.  Cardiovascular: Normal rate and regular rhythm.   Pulmonary/Chest: Effort normal and breath sounds normal.  On 2L [place]  Neurological: He is alert and oriented to person, place, and time.  Vitals reviewed.       Assessment & Plan:        FU with Cardiology and Pulmonology as directed.  Continue current regimen.  Discussed torsemide  to help with fluid and ability to obtain mag at [employer].   DM:  A1C down to 9.1 from 9.8.  Continue current regimen.  Encourage strict diet compliance.  Declines Endo clinic at this time.    Staci Acosta, NP   [facility]

## 2017-02-28 NOTE — Addendum Note (Signed)
Addended by: Reino Kent on: 02/28/2017 07:02 PM   Modules accepted: Orders

## 2017-03-04 ENCOUNTER — Telehealth: Payer: Self-pay | Admitting: Pharmacist

## 2017-03-04 NOTE — Telephone Encounter (Signed)
[place] application for [employer] 35 units under the skin three times a day with each meal. Max daily dose 105 units. Also, Faxed [employer] application for [employer] Inject 65-80 units under the skin every day, Max daily dose 80 units.

## 2017-03-12 NOTE — Telephone Encounter (Signed)
Placed signed application/script in [facility] folder for pickup. 

## 2017-03-14 ENCOUNTER — Other Ambulatory Visit: Payer: Self-pay

## 2017-03-14 ENCOUNTER — Ambulatory Visit: Payer: Self-pay | Admitting: Adult Health Nurse Practitioner

## 2017-03-14 VITALS — BP 109/73 | HR 91 | Temp 98.2°F | Wt 296.5 lb

## 2017-03-14 DIAGNOSIS — R609 Edema, unspecified: Secondary | ICD-10-CM | POA: Insufficient documentation

## 2017-03-14 DIAGNOSIS — E119 Type 2 diabetes mellitus without complications: Secondary | ICD-10-CM

## 2017-03-14 DIAGNOSIS — I10 Essential (primary) hypertension: Secondary | ICD-10-CM

## 2017-03-14 LAB — GLUCOSE, POCT (MANUAL RESULT ENTRY): POC Glucose: 377 mg/dl — AB (ref 70–99)

## 2017-03-14 NOTE — Progress Notes (Signed)
  Patient: [name] Male    DOB: [birth year]   51 y.o.   MRN: [id number] Visit Date: 03/14/2017  Today's Provider: Staci Acosta, NP   No chief complaint on file.  Subjective:    HPI   Pt states that his leg is swelling x 2 weeks. Feels like he is carrying a "weight around".  Denies fever, chills, N/V/D.  Doppler in June 2017 showed no DVT.  Denies drainage.     No Known Allergies Previous Medications   ALBUTEROL (PROVENTIL HFA;VENTOLIN HFA) 108 (90 BASE) MCG/ACT INHALER    Inhale 2 puffs into the lungs every 4 (four) hours as needed for wheezing or shortness of breath.   ALLOPURINOL (ZYLOPRIM) 300 MG TABLET    Take 1 tablet (300 mg total) by mouth daily.   ASPIRIN EC 81 MG TABLET    Take 1 tablet (81 mg total) by mouth daily.   ATORVASTATIN (LIPITOR) 40 MG TABLET    Take 1 tablet (40 mg total) by mouth at bedtime.   CARVEDILOL (COREG) 25 MG TABLET    Take 1 tablet (25 mg total) by mouth every 12 (twelve) hours.   FUROSEMIDE (LASIX) 40 MG TABLET    Take 1-2 tabs morning and noon   GABAPENTIN (NEURONTIN) 300 MG CAPSULE    Take 2 capsules (600 mg total) by mouth 3 (three) times daily.   HYDRALAZINE (APRESOLINE) 50 MG TABLET    Take 1 tablet (50 mg total) by mouth 3 (three) times daily.   INSULIN ASPART (NOVOLOG) 100 UNIT/ML INJECTION    Inject 20-35 Units into the skin 3 (three) times daily before meals.   INSULIN GLARGINE (LANTUS) 100 UNIT/ML INJECTION    Inject 0.65-0.8 mLs (65-80 Units total) into the skin at bedtime.   ISOSORBIDE DINITRATE (ISORDIL) 20 MG TABLET    Take 1 tablet (20 mg total) by mouth 3 (three) times daily.   OMEPRAZOLE (PRILOSEC) 20 MG CAPSULE    Take 1 capsule (20 mg total) by mouth 2 (two) times daily before a meal.   TRIAMCINOLONE CREAM (KENALOG) 0.1 %    Apply 1 application topically 2 (two) times daily.   TRIAMCINOLONE OINTMENT (KENALOG) 0.5 %    Apply 1 application topically 2 (two) times daily.    Review of Systems  All other systems  reviewed and are negative.   Social History  Substance Use Topics  . Smoking status: Never Smoker  . Smokeless tobacco: Never Used  . Alcohol use Yes     Comment: very rarely   Objective:   There were no vitals taken for this visit.  Physical Exam  Constitutional: He appears well-developed and well-nourished.  Cardiovascular: Normal rate.   BLE with baseline edema, no s/s of infection. No warmth. Baseline red/purple skin due to psoriasis. Blanchable.   Pulmonary/Chest: Effort normal.  On oxygen        Assessment & Plan:      VS: 109/73 98.2 91 weight 296.5 Weight down 2lbs.    No infection noted at this time.  Continue to elevated legs when sitting or lying.  FU with cardiology as indicated.    Staci Acosta, NP   [facility]

## 2017-03-21 ENCOUNTER — Telehealth: Payer: Self-pay | Admitting: Pharmacist

## 2017-03-21 ENCOUNTER — Ambulatory Visit: Payer: Self-pay | Admitting: Podiatry

## 2017-03-21 NOTE — Telephone Encounter (Signed)
03/21/17 Mailing [employer] application for [employer] HFA 45mcg Inhale 2 puffs every four hours.

## 2017-03-25 ENCOUNTER — Ambulatory Visit: Payer: Self-pay | Admitting: Podiatry

## 2017-04-15 ENCOUNTER — Encounter: Payer: Self-pay | Admitting: Podiatry

## 2017-04-15 ENCOUNTER — Ambulatory Visit (INDEPENDENT_AMBULATORY_CARE_PROVIDER_SITE_OTHER): Payer: Medicaid Other | Admitting: Podiatry

## 2017-04-15 DIAGNOSIS — B351 Tinea unguium: Secondary | ICD-10-CM | POA: Diagnosis not present

## 2017-04-15 DIAGNOSIS — I872 Venous insufficiency (chronic) (peripheral): Secondary | ICD-10-CM

## 2017-04-15 DIAGNOSIS — E1142 Type 2 diabetes mellitus with diabetic polyneuropathy: Secondary | ICD-10-CM

## 2017-04-15 DIAGNOSIS — M79676 Pain in unspecified toe(s): Secondary | ICD-10-CM | POA: Diagnosis not present

## 2017-04-15 NOTE — Progress Notes (Signed)
Subjective:     Patient ID: [name], male   DOB: [birth year], 51 y.o.   MRN: [id number]  HPI This patient presents to the office with long thick nails  He says he cannot wear shoes due to his nails.  He is diabetic with neuropathy.  He presents for preventative foot care services.   Review of Systems     Objective:   Physical Exam GENERAL APPEARANCE: Alert, conversant. Appropriately groomed. No acute distress.  VASCULAR: Pedal pulses are  Not   palpable at  [facility] and PT bilateral due to severe swelling. Capillary refill time is immediate to all digits,  Normal temperature gradient.  Venous stasis and swelling both legs. NEUROLOGIC: sensation is normal to 5.07 monofilament at 5/5 sites bilateral.  Light touch is intact bilateral, Muscle strength normal.  MUSCULOSKELETAL: acceptable muscle strength, tone and stability bilateral.  Intrinsic muscluature intact bilateral.  Rectus appearance of foot and digits noted bilateral.   DERMATOLOGIC: skin color, texture, and turgor are within normal limits.  No preulcerative lesions or ulcers  are seen, no interdigital maceration noted.  No open lesions present.  Digital nails are asymptomatic. No drainage noted.      Assessment:     Onychomycosis  Diabetes with neuropathy    Plan:     IE  Debride nails  B/L.  Told him to work on his swelling.  RTC 3 months.   Gardiner Barefoot DPM

## 2017-05-16 ENCOUNTER — Telehealth: Payer: Self-pay | Admitting: Pharmacist

## 2017-05-16 NOTE — Telephone Encounter (Signed)
Placed signed application/script in [facility] folder for pickup. 

## 2017-05-16 NOTE — Telephone Encounter (Signed)
05/16/17 Faxed refill request to [facility] for Lantus Solostar pen Inject 65-80 units under the skin every day Max daily dose 80 units.

## 2017-05-27 ENCOUNTER — Telehealth: Payer: Self-pay | Admitting: *Deleted

## 2017-05-27 NOTE — Telephone Encounter (Signed)
Pt states his last appt was 04/15/2017, when is his next appt.

## 2017-05-30 ENCOUNTER — Other Ambulatory Visit: Payer: Self-pay

## 2017-06-06 ENCOUNTER — Ambulatory Visit: Payer: Self-pay

## 2017-07-11 ENCOUNTER — Ambulatory Visit (INDEPENDENT_AMBULATORY_CARE_PROVIDER_SITE_OTHER): Payer: Medicaid Other | Admitting: Podiatry

## 2017-07-11 ENCOUNTER — Encounter: Payer: Self-pay | Admitting: Podiatry

## 2017-07-11 DIAGNOSIS — L409 Psoriasis, unspecified: Secondary | ICD-10-CM

## 2017-07-11 DIAGNOSIS — M79676 Pain in unspecified toe(s): Secondary | ICD-10-CM | POA: Diagnosis not present

## 2017-07-11 DIAGNOSIS — E1142 Type 2 diabetes mellitus with diabetic polyneuropathy: Secondary | ICD-10-CM

## 2017-07-11 DIAGNOSIS — I872 Venous insufficiency (chronic) (peripheral): Secondary | ICD-10-CM

## 2017-07-11 DIAGNOSIS — B351 Tinea unguium: Secondary | ICD-10-CM

## 2017-07-11 MED ORDER — AMMONIUM LACTATE 12 % EX CREA: TOPICAL_CREAM | CUTANEOUS | 0 refills | Status: DC | PRN

## 2017-07-11 NOTE — Progress Notes (Signed)
Complaint:  Visit Type: Patient returns to my office for continued preventative foot care services. Complaint: Patient states" my nails have grown long and thick and become painful to walk and wear shoes" Patient has been diagnosed with DM with no foot complications. The patient presents for preventative foot care services. No changes to ROS.  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  Podiatric Exam: Vascular: dorsalis pedis and posterior tibial pulses are palpable bilateral. Capillary return is immediate. Temperature gradient is WNL. Skin turgor WNL  Sensorium: Normal Semmes Weinstein monofilament test. Normal tactile sensation bilaterally. Nail Exam: Pt has thick disfigured discolored nails with subungual debris noted bilateral entire nail hallux through fifth toenails Ulcer Exam: There is no evidence of ulcer or pre-ulcerative changes or infection. Orthopedic Exam: Muscle tone and strength are WNL. No limitations in general ROM. No crepitus or effusions noted. Foot type and digits show no abnormalities. Bony prominences are unremarkable. Skin: No Porokeratosis. No infection or ulcers.  Psoriasis legs. Plantar callus  B/AL  Diagnosis:  Onychomycosis, , Pain in right toe, pain in left toes  Treatment & Plan Procedures and Treatment: Consent by patient was obtained for treatment procedures. The patient understood the discussion of treatment and procedures well. All questions were answered thoroughly reviewed. Debridement of mycotic and hypertrophic toenails, 1 through 5 bilateral and clearing of subungual debris. No ulceration, no infection noted.  Prescribe lac-hydrin. Return Visit-Office Procedure: Patient instructed to return to the office for a follow up visit 3 months for continued evaluation and treatment.    Gardiner Barefoot DPM

## 2017-07-11 NOTE — Addendum Note (Signed)
Addended byDeidre Ala, AMMIE L on: 07/11/2017 10:34 AM   Modules accepted: Orders

## 2017-07-15 ENCOUNTER — Ambulatory Visit: Payer: Medicaid Other | Admitting: Podiatry

## 2017-08-13 ENCOUNTER — Other Ambulatory Visit: Payer: Self-pay | Admitting: Podiatry

## 2017-09-26 DIAGNOSIS — E662 Morbid (severe) obesity with alveolar hypoventilation: Secondary | ICD-10-CM | POA: Insufficient documentation

## 2017-10-10 ENCOUNTER — Ambulatory Visit (INDEPENDENT_AMBULATORY_CARE_PROVIDER_SITE_OTHER): Payer: Medicaid Other | Admitting: Podiatry

## 2017-10-10 ENCOUNTER — Encounter: Payer: Self-pay | Admitting: Podiatry

## 2017-10-10 DIAGNOSIS — M79676 Pain in unspecified toe(s): Secondary | ICD-10-CM

## 2017-10-10 DIAGNOSIS — I872 Venous insufficiency (chronic) (peripheral): Secondary | ICD-10-CM

## 2017-10-10 DIAGNOSIS — E1142 Type 2 diabetes mellitus with diabetic polyneuropathy: Secondary | ICD-10-CM

## 2017-10-10 DIAGNOSIS — B351 Tinea unguium: Secondary | ICD-10-CM | POA: Diagnosis not present

## 2017-10-10 NOTE — Progress Notes (Addendum)
Complaint:  Visit Type: Patient returns to my office for continued preventative foot care services. Complaint: Patient states" my nails have grown long and thick and become painful to walk and wear shoes" Patient has been diagnosed with DM with no foot complications. The patient presents for preventative foot care services. No changes to ROS.  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  Podiatric Exam: Vascular: dorsalis pedis and posterior tibial pulses are palpable bilateral. Capillary return is immediate. Temperature gradient is WNL. Skin turgor WNL  Sensorium: Normal Semmes Weinstein monofilament test. Normal tactile sensation bilaterally. Nail Exam: Pt has thick disfigured discolored nails with subungual debris noted bilateral entire nail hallux through fifth toenails Ulcer Exam: There is no evidence of ulcer or pre-ulcerative changes or infection. Orthopedic Exam: Muscle tone and strength are WNL. No limitations in general ROM. No crepitus or effusions noted. Foot type and digits show no abnormalities. Bony prominences are unremarkable. Skin: No Porokeratosis. No infection or ulcers.  Psoriasis legs. Plantar callus  B/L  Diagnosis:  Onychomycosis, , Pain in right toe, pain in left toes  Treatment & Plan Procedures and Treatment: Consent by patient was obtained for treatment procedures. The patient understood the discussion of treatment and procedures well. All questions were answered thoroughly reviewed. Debridement of mycotic and hypertrophic toenails, 1 through 5 bilateral and clearing of subungual debris. No ulceration, no infection noted.  ABN signed for medicaid  Patient.   Return Visit-Office Procedure: Patient instructed to return to the office for a follow up visit 3 months for continued evaluation and treatment.    Gardiner Barefoot DPM

## 2017-11-11 ENCOUNTER — Other Ambulatory Visit: Payer: Self-pay | Admitting: Podiatry

## 2017-12-03 ENCOUNTER — Other Ambulatory Visit: Payer: Self-pay | Admitting: Podiatry

## 2017-12-19 ENCOUNTER — Ambulatory Visit: Payer: Medicaid Other | Admitting: Podiatry

## 2017-12-30 ENCOUNTER — Ambulatory Visit: Payer: Medicaid Other | Admitting: Podiatry

## 2018-01-09 ENCOUNTER — Other Ambulatory Visit: Payer: Self-pay

## 2018-01-09 ENCOUNTER — Emergency Department: Payer: BLUE CROSS/BLUE SHIELD

## 2018-01-09 ENCOUNTER — Inpatient Hospital Stay
Admission: EM | Admit: 2018-01-09 | Discharge: 2018-01-14 | DRG: 291 | Disposition: A | Discharge status: Home Health | Payer: BLUE CROSS/BLUE SHIELD | Attending: Internal Medicine | Admitting: Internal Medicine

## 2018-01-09 ENCOUNTER — Encounter: Payer: Self-pay | Admitting: Emergency Medicine

## 2018-01-09 DIAGNOSIS — M109 Gout, unspecified: Secondary | ICD-10-CM | POA: Diagnosis present

## 2018-01-09 DIAGNOSIS — J9690 Respiratory failure, unspecified, unspecified whether with hypoxia or hypercapnia: Secondary | ICD-10-CM

## 2018-01-09 DIAGNOSIS — Z794 Long term (current) use of insulin: Secondary | ICD-10-CM | POA: Diagnosis not present

## 2018-01-09 DIAGNOSIS — J441 Chronic obstructive pulmonary disease with (acute) exacerbation: Secondary | ICD-10-CM | POA: Diagnosis present

## 2018-01-09 DIAGNOSIS — Y92230 Patient room in hospital as the place of occurrence of the external cause: Secondary | ICD-10-CM | POA: Diagnosis not present

## 2018-01-09 DIAGNOSIS — N183 Chronic kidney disease, stage 3 (moderate): Secondary | ICD-10-CM | POA: Diagnosis present

## 2018-01-09 DIAGNOSIS — E662 Morbid (severe) obesity with alveolar hypoventilation: Secondary | ICD-10-CM | POA: Diagnosis present

## 2018-01-09 DIAGNOSIS — I5043 Acute on chronic combined systolic (congestive) and diastolic (congestive) heart failure: Secondary | ICD-10-CM

## 2018-01-09 DIAGNOSIS — S0992XA Unspecified injury of nose, initial encounter: Secondary | ICD-10-CM | POA: Diagnosis not present

## 2018-01-09 DIAGNOSIS — I429 Cardiomyopathy, unspecified: Secondary | ICD-10-CM | POA: Diagnosis present

## 2018-01-09 DIAGNOSIS — E1140 Type 2 diabetes mellitus with diabetic neuropathy, unspecified: Secondary | ICD-10-CM | POA: Diagnosis present

## 2018-01-09 DIAGNOSIS — E1165 Type 2 diabetes mellitus with hyperglycemia: Secondary | ICD-10-CM | POA: Diagnosis not present

## 2018-01-09 DIAGNOSIS — Z79899 Other long term (current) drug therapy: Secondary | ICD-10-CM

## 2018-01-09 DIAGNOSIS — R601 Generalized edema: Secondary | ICD-10-CM

## 2018-01-09 DIAGNOSIS — I878 Other specified disorders of veins: Secondary | ICD-10-CM | POA: Diagnosis present

## 2018-01-09 DIAGNOSIS — I130 Hypertensive heart and chronic kidney disease with heart failure and stage 1 through stage 4 chronic kidney disease, or unspecified chronic kidney disease: Principal | ICD-10-CM | POA: Diagnosis present

## 2018-01-09 DIAGNOSIS — R0600 Dyspnea, unspecified: Secondary | ICD-10-CM

## 2018-01-09 DIAGNOSIS — I5033 Acute on chronic diastolic (congestive) heart failure: Secondary | ICD-10-CM | POA: Diagnosis present

## 2018-01-09 DIAGNOSIS — E873 Alkalosis: Secondary | ICD-10-CM | POA: Diagnosis present

## 2018-01-09 DIAGNOSIS — I472 Ventricular tachycardia: Secondary | ICD-10-CM | POA: Diagnosis present

## 2018-01-09 DIAGNOSIS — Z6841 Body Mass Index (BMI) 40.0 and over, adult: Secondary | ICD-10-CM | POA: Diagnosis not present

## 2018-01-09 DIAGNOSIS — J9621 Acute and chronic respiratory failure with hypoxia: Secondary | ICD-10-CM | POA: Diagnosis present

## 2018-01-09 DIAGNOSIS — L299 Pruritus, unspecified: Secondary | ICD-10-CM | POA: Diagnosis not present

## 2018-01-09 DIAGNOSIS — Z23 Encounter for immunization: Secondary | ICD-10-CM

## 2018-01-09 DIAGNOSIS — W07XXXA Fall from chair, initial encounter: Secondary | ICD-10-CM | POA: Diagnosis not present

## 2018-01-09 DIAGNOSIS — Z841 Family history of disorders of kidney and ureter: Secondary | ICD-10-CM

## 2018-01-09 DIAGNOSIS — K219 Gastro-esophageal reflux disease without esophagitis: Secondary | ICD-10-CM | POA: Diagnosis present

## 2018-01-09 DIAGNOSIS — I272 Pulmonary hypertension, unspecified: Secondary | ICD-10-CM | POA: Diagnosis present

## 2018-01-09 DIAGNOSIS — I2510 Atherosclerotic heart disease of native coronary artery without angina pectoris: Secondary | ICD-10-CM | POA: Diagnosis present

## 2018-01-09 DIAGNOSIS — L400 Psoriasis vulgaris: Secondary | ICD-10-CM | POA: Diagnosis present

## 2018-01-09 DIAGNOSIS — J9622 Acute and chronic respiratory failure with hypercapnia: Secondary | ICD-10-CM | POA: Diagnosis present

## 2018-01-09 DIAGNOSIS — D649 Anemia, unspecified: Secondary | ICD-10-CM | POA: Diagnosis present

## 2018-01-09 DIAGNOSIS — E1122 Type 2 diabetes mellitus with diabetic chronic kidney disease: Secondary | ICD-10-CM | POA: Diagnosis present

## 2018-01-09 DIAGNOSIS — Z8249 Family history of ischemic heart disease and other diseases of the circulatory system: Secondary | ICD-10-CM | POA: Diagnosis not present

## 2018-01-09 DIAGNOSIS — Z7982 Long term (current) use of aspirin: Secondary | ICD-10-CM

## 2018-01-09 DIAGNOSIS — D72829 Elevated white blood cell count, unspecified: Secondary | ICD-10-CM | POA: Diagnosis present

## 2018-01-09 DIAGNOSIS — E785 Hyperlipidemia, unspecified: Secondary | ICD-10-CM | POA: Diagnosis present

## 2018-01-09 DIAGNOSIS — I5023 Acute on chronic systolic (congestive) heart failure: Secondary | ICD-10-CM

## 2018-01-09 LAB — CBC
HCT: 34.5 % — ABNORMAL LOW (ref 40.0–52.0)
Hemoglobin: 11 g/dL — ABNORMAL LOW (ref 13.0–18.0)
MCH: 26.8 pg (ref 26.0–34.0)
MCHC: 32 g/dL (ref 32.0–36.0)
MCV: 84 fL (ref 80.0–100.0)
Platelets: 270 10*3/uL (ref 150–440)
RBC: 4.11 MIL/uL — ABNORMAL LOW (ref 4.40–5.90)
RDW: 16.3 % — ABNORMAL HIGH (ref 11.5–14.5)
WBC: 11.6 10*3/uL — ABNORMAL HIGH (ref 3.8–10.6)

## 2018-01-09 LAB — GLUCOSE, CAPILLARY: Glucose-Capillary: 254 mg/dL — ABNORMAL HIGH (ref 65–99)

## 2018-01-09 LAB — BASIC METABOLIC PANEL
Anion gap: 9 (ref 5–15)
BUN: 23 mg/dL — ABNORMAL HIGH (ref 6–20)
CO2: 36 mmol/L — ABNORMAL HIGH (ref 22–32)
Calcium: 9.1 mg/dL (ref 8.9–10.3)
Chloride: 95 mmol/L — ABNORMAL LOW (ref 101–111)
Creatinine, Ser: 1.79 mg/dL — ABNORMAL HIGH (ref 0.61–1.24)
GFR calc Af Amer: 50 mL/min — ABNORMAL LOW (ref >60)
GFR calc non Af Amer: 43 mL/min — ABNORMAL LOW (ref >60)
Glucose, Bld: 90 mg/dL (ref 65–99)
Potassium: 3.9 mmol/L (ref 3.5–5.1)
Sodium: 140 mmol/L (ref 135–145)

## 2018-01-09 LAB — BRAIN NATRIURETIC PEPTIDE: B Natriuretic Peptide: 62 pg/mL (ref 0.0–100.0)

## 2018-01-09 LAB — TROPONIN I: Troponin I: <0.03 ng/mL (ref <0.03)

## 2018-01-09 MED ORDER — ACETAMINOPHEN 650 MG RE SUPP: 650.0000 mg | Freq: Four times a day (QID) | RECTAL | Status: DC | PRN

## 2018-01-09 MED ORDER — ATORVASTATIN CALCIUM 20 MG PO TABS
40.0000 mg | ORAL_TABLET | Freq: Every day | ORAL | Status: DC
  Administered 2018-01-09 – 2018-01-13 (×5): 40 mg via ORAL
  Filled 2018-01-09 (×5): qty 2

## 2018-01-09 MED ORDER — FUROSEMIDE 10 MG/ML IJ SOLN
60.0000 mg | Freq: Once | INTRAMUSCULAR | Status: AC
  Administered 2018-01-09: 60 mg via INTRAVENOUS
  Filled 2018-01-09: qty 8

## 2018-01-09 MED ORDER — ALLOPURINOL 300 MG PO TABS
300.0000 mg | ORAL_TABLET | Freq: Every day | ORAL | Status: DC
  Administered 2018-01-10 – 2018-01-14 (×5): 300 mg via ORAL
  Filled 2018-01-09 (×6): qty 1

## 2018-01-09 MED ORDER — MORPHINE SULFATE (PF) 4 MG/ML IV SOLN
4.0000 mg | Freq: Once | INTRAVENOUS | Status: AC
  Administered 2018-01-09: 4 mg via INTRAVENOUS
  Filled 2018-01-09: qty 1

## 2018-01-09 MED ORDER — BISACODYL 5 MG PO TBEC
5.0000 mg | DELAYED_RELEASE_TABLET | Freq: Every day | ORAL | Status: DC | PRN
  Administered 2018-01-11 – 2018-01-13 (×3): 5 mg via ORAL
  Filled 2018-01-09 (×4): qty 1

## 2018-01-09 MED ORDER — FUROSEMIDE 10 MG/ML IJ SOLN
60.0000 mg | Freq: Four times a day (QID) | INTRAMUSCULAR | Status: DC
  Administered 2018-01-09 – 2018-01-11 (×6): 60 mg via INTRAVENOUS
  Filled 2018-01-09 (×6): qty 6

## 2018-01-09 MED ORDER — ASPIRIN EC 81 MG PO TBEC
81.0000 mg | DELAYED_RELEASE_TABLET | Freq: Every day | ORAL | Status: DC
  Administered 2018-01-10 – 2018-01-14 (×5): 81 mg via ORAL
  Filled 2018-01-09 (×5): qty 1

## 2018-01-09 MED ORDER — TRIAMCINOLONE ACETONIDE 0.5 % EX OINT
1.0000 "application " | TOPICAL_OINTMENT | Freq: Two times a day (BID) | CUTANEOUS | Status: DC
  Administered 2018-01-09 – 2018-01-13 (×8): 1 via TOPICAL
  Filled 2018-01-09 (×7): qty 15

## 2018-01-09 MED ORDER — ISOSORBIDE DINITRATE 20 MG PO TABS
20.0000 mg | ORAL_TABLET | Freq: Three times a day (TID) | ORAL | Status: DC
  Administered 2018-01-09 – 2018-01-14 (×14): 20 mg via ORAL
  Filled 2018-01-09 (×20): qty 1

## 2018-01-09 MED ORDER — ALBUTEROL SULFATE (2.5 MG/3ML) 0.083% IN NEBU: 3.0000 mL | INHALATION_SOLUTION | RESPIRATORY_TRACT | Status: DC | PRN

## 2018-01-09 MED ORDER — INSULIN GLARGINE 100 UNIT/ML ~~LOC~~ SOLN
35.0000 [IU] | Freq: Every day | SUBCUTANEOUS | Status: DC
  Administered 2018-01-09 – 2018-01-10 (×2): 35 [IU] via SUBCUTANEOUS
  Filled 2018-01-09 (×3): qty 0.35

## 2018-01-09 MED ORDER — INFLUENZA VAC SPLIT QUAD 0.5 ML IM SUSY
0.5000 mL | PREFILLED_SYRINGE | INTRAMUSCULAR | Status: AC
  Administered 2018-01-11: 0.5 mL via INTRAMUSCULAR
  Filled 2018-01-09: qty 0.5

## 2018-01-09 MED ORDER — OXYCODONE-ACETAMINOPHEN 5-325 MG PO TABS
1.0000 | ORAL_TABLET | Freq: Four times a day (QID) | ORAL | Status: DC | PRN
  Administered 2018-01-09 – 2018-01-12 (×6): 1 via ORAL
  Filled 2018-01-09 (×6): qty 1

## 2018-01-09 MED ORDER — AMMONIUM LACTATE 12 % EX CREA
TOPICAL_CREAM | CUTANEOUS | Status: DC | PRN
  Filled 2018-01-09: qty 385

## 2018-01-09 MED ORDER — INSULIN ASPART 100 UNIT/ML ~~LOC~~ SOLN
0.0000 [IU] | Freq: Three times a day (TID) | SUBCUTANEOUS | Status: DC
  Administered 2018-01-10: 11 [IU] via SUBCUTANEOUS
  Administered 2018-01-10: 7 [IU] via SUBCUTANEOUS
  Administered 2018-01-10: 15 [IU] via SUBCUTANEOUS
  Filled 2018-01-09 (×3): qty 1

## 2018-01-09 MED ORDER — DOCUSATE SODIUM 100 MG PO CAPS
100.0000 mg | ORAL_CAPSULE | Freq: Two times a day (BID) | ORAL | Status: DC
  Administered 2018-01-09 – 2018-01-13 (×8): 100 mg via ORAL
  Filled 2018-01-09 (×8): qty 1

## 2018-01-09 MED ORDER — HYDRALAZINE HCL 50 MG PO TABS
50.0000 mg | ORAL_TABLET | Freq: Three times a day (TID) | ORAL | Status: DC
  Administered 2018-01-09 – 2018-01-14 (×14): 50 mg via ORAL
  Filled 2018-01-09 (×14): qty 1

## 2018-01-09 MED ORDER — HEPARIN SODIUM (PORCINE) 5000 UNIT/ML IJ SOLN
5000.0000 [IU] | Freq: Three times a day (TID) | INTRAMUSCULAR | Status: DC
  Administered 2018-01-09 – 2018-01-14 (×15): 5000 [IU] via SUBCUTANEOUS
  Filled 2018-01-09 (×15): qty 1

## 2018-01-09 MED ORDER — ONDANSETRON HCL 4 MG/2ML IJ SOLN
4.0000 mg | Freq: Once | INTRAMUSCULAR | Status: AC
  Administered 2018-01-09: 4 mg via INTRAVENOUS
  Filled 2018-01-09: qty 2

## 2018-01-09 MED ORDER — ONDANSETRON HCL 4 MG PO TABS: 4.0000 mg | ORAL_TABLET | Freq: Four times a day (QID) | ORAL | Status: DC | PRN

## 2018-01-09 MED ORDER — CARVEDILOL 25 MG PO TABS
25.0000 mg | ORAL_TABLET | Freq: Two times a day (BID) | ORAL | Status: DC
  Administered 2018-01-09 – 2018-01-14 (×10): 25 mg via ORAL
  Filled 2018-01-09 (×10): qty 1

## 2018-01-09 MED ORDER — ACETAMINOPHEN 325 MG PO TABS
650.0000 mg | ORAL_TABLET | Freq: Four times a day (QID) | ORAL | Status: DC | PRN
  Administered 2018-01-11 – 2018-01-13 (×2): 650 mg via ORAL
  Filled 2018-01-09 (×2): qty 2

## 2018-01-09 MED ORDER — ONDANSETRON HCL 4 MG/2ML IJ SOLN: 4.0000 mg | Freq: Four times a day (QID) | INTRAMUSCULAR | Status: DC | PRN

## 2018-01-09 MED ORDER — GABAPENTIN 300 MG PO CAPS
600.0000 mg | ORAL_CAPSULE | Freq: Three times a day (TID) | ORAL | Status: DC
  Administered 2018-01-09 – 2018-01-14 (×14): 600 mg via ORAL
  Filled 2018-01-09 (×14): qty 2

## 2018-01-09 NOTE — ED Notes (Signed)
IV was unsuccessful for IV stick or blood, MD Paduchowski at bedside with ultrasound at this time

## 2018-01-09 NOTE — ED Notes (Signed)
IV team at bedside 

## 2018-01-09 NOTE — H&P (Addendum)
[facility] NAME: [name]    MR#:  [id number]  DATE OF BIRTH:  01-16-1966  DATE OF ADMISSION:  01/09/2018  PRIMARY CARE PHYSICIAN: System, Pcp Not In   REQUESTING/REFERRING PHYSICIAN: Dr. Harvest Dark  CHIEF COMPLAINT: Leg swelling, weight gain   Chief Complaint  Patient presents with  . Chest Pain  . Leg Swelling    HISTORY OF PRESENT ILLNESS:  [name]  is a 52 y.o. male with a known history of systolic heart failure, diabetes, essential hypertension, obesity hypoventilation on BiPAP at home at night brought came in because of worsening pedal edema tightness in the abdomen and also legs, weight gain of up to 6 pounds in 1 week.  Patient follows up with [facility] heart failure clinic, he says that he is up to date and also did not miss any of the diuretics that he takes at home.  Patient on supplemental oxygen 2 L all the time.  By [facility] heart failure clinic a month ago.  Patient also went to [facility] ED on 19 January for chest pain but was discharged from the emergency room.  She has diffuse psoriasis.  Patient had right-sided chest pain and evaluated in ED on January 19 at [facility] and discharged from there.  PAST MEDICAL HISTORY:   Past Medical History:  Diagnosis Date  . Bell's palsy   . CHF (congestive heart failure) ([place])   . Diabetes mellitus without complication ([place])   . Hypertension   . NSVT (nonsustained ventricular tachycardia) ([place])     PAST SURGICAL HISTOIRY:  History reviewed. No pertinent surgical history.  SOCIAL HISTORY:   Social History   Tobacco Use  . Smoking status: Never Smoker  . Smokeless tobacco: Never Used  Substance Use Topics  . Alcohol use: Yes    Comment: very rarely    FAMILY HISTORY:   Family History  Problem Relation Age of Onset  . Heart failure Mother   . Kidney failure Brother     DRUG ALLERGIES:  No Known Allergies  REVIEW OF SYSTEMS:  CONSTITUTIONAL: No  fever, fatigue or weakness.  EYES: No blurred or double vision.  EARS, NOSE, AND THROAT: No tinnitus or ear pain.  RESPIRATORY: Shortness of breath, orthopnea, PND.  CARDIOVASCULAR: No chest pain, orthopnea, edema.  GASTROINTESTINAL: No nausea, vomiting, diarrhea or abdominal pain.  Tightness in the abdomen shortness of breath, orthopnea, PND. GENITOURINARY: No dysuria, hematuria.  ENDOCRINE: No polyuria, nocturia,  HEMATOLOGY: No anemia, easy bruising or bleeding SKIN: No rash or lesion. MUSCULOSKELETAL: No joint pain or arthritis.  Pedal edema NEUROLOGIC: No tingling, numbness, weakness.  PSYCHIATRY: No anxiety or depression.   MEDICATIONS AT HOME:   Prior to Admission medications   Medication Sig Start Date End Date Taking? Authorizing Provider  albuterol (PROVENTIL HFA;VENTOLIN HFA) 108 (90 Base) MCG/ACT inhaler Inhale 2 puffs into the lungs every 4 (four) hours as needed for wheezing or shortness of breath. 02/07/17   Odem, Coolidge Breeze, FNP  allopurinol (ZYLOPRIM) 300 MG tablet Take 1 tablet (300 mg total) by mouth daily. 02/14/17   Guadalupe Maple, MD  ammonium lactate (AMLACTIN) 12 % cream APPLY TOPICALLY AS NEEDED FOR DRY SKIN. 12/03/17   Gardiner Barefoot, DPM  aspirin EC 81 MG tablet Take 1 tablet (81 mg total) by mouth daily. 02/07/17   Boyce Medici, FNP  atorvastatin (LIPITOR) 40 MG tablet Take 1 tablet (40 mg total) by mouth at bedtime. 02/14/17  Guadalupe Maple, MD  carvedilol (COREG) 25 MG tablet Take 1 tablet (25 mg total) by mouth every 12 (twelve) hours. 02/14/17   Guadalupe Maple, MD  furosemide (LASIX) 40 MG tablet Take 1-2 tabs morning and noon 02/14/17   Guadalupe Maple, MD  gabapentin (NEURONTIN) 300 MG capsule Take 2 capsules (600 mg total) by mouth 3 (three) times daily. 02/14/17   Guadalupe Maple, MD  hydrALAZINE (APRESOLINE) 50 MG tablet Take 1 tablet (50 mg total) by mouth 3 (three) times daily. 02/14/17   Guadalupe Maple, MD  insulin aspart (NOVOLOG) 100 UNIT/ML injection  Inject 20-35 Units into the skin 3 (three) times daily before meals. 02/28/17   Doles-Johnson, Teah, NP  insulin glargine (LANTUS) 100 UNIT/ML injection Inject 0.65-0.8 mLs (65-80 Units total) into the skin at bedtime. 02/28/17   Doles-Johnson, Teah, NP  isosorbide dinitrate (ISORDIL) 20 MG tablet Take 1 tablet (20 mg total) by mouth 3 (three) times daily. 02/14/17   Guadalupe Maple, MD  omeprazole (PRILOSEC) 20 MG capsule Take 1 capsule (20 mg total) by mouth 2 (two) times daily before a meal. 02/14/17   Crissman, Jeannette How, MD  triamcinolone cream (KENALOG) 0.1 % Apply 1 application topically 2 (two) times daily. 02/14/17   Guadalupe Maple, MD  triamcinolone ointment (KENALOG) 0.5 % Apply 1 application topically 2 (two) times daily. 02/08/17   Tawni Millers, MD      VITAL SIGNS:  Blood pressure 125/79, pulse 83, temperature 98.4 F (36.9 C), temperature source Oral, resp. rate 20, weight 134.3 kg (296 lb), SpO2 93 %.  PHYSICAL EXAMINATION:  GENERAL:  52 y.o.-year-old patient lying in the bed with no acute distress.  EYES: Pupils equal, round, reactive to light and accommodation. No scleral icterus. Extraocular muscles intact.  HEENT: Head atraumatic, normocephalic. Oropharynx and nasopharynx clear.  NECK:  Supple, no jugular venous distention. No thyroid enlargement, no tenderness.  LUNGS:coarse breath sounds bilaterally at bases cARDIOVASCULAR: S1, S2 normal. No murmurs, rubs, or gallops.  ABDOMEN: Soft, nontender, nondistended. Bowel sounds present. No organomegaly or mass.  EXTREMITIES: 3+ pitting edema, patient has chronic eczematous skin with weeping.  Some blood also noted. NEUROLOGIC: Cranial nerves II through XII are intact. Muscle strength 5/5 in all extremities. Sensation intact. Gait not checked.  PSYCHIATRIC: The patient is alert and oriented x 3.  SKIN: No obvious rash, lesion, or ulcer.   LABORATORY PANEL:   CBC Recent Labs  Lab 01/09/18 1427  WBC 11.6*  HGB 11.0*  HCT 34.5*   PLT 270   ------------------------------------------------------------------------------------------------------------------  Chemistries  Recent Labs  Lab 01/09/18 1427  NA 140  K 3.9  CL 95*  CO2 36*  GLUCOSE 90  BUN 23*  CREATININE 1.79*  CALCIUM 9.1   ------------------------------------------------------------------------------------------------------------------  Cardiac Enzymes Recent Labs  Lab 01/09/18 1427  TROPONINI <0.03   ------------------------------------------------------------------------------------------------------------------  RADIOLOGY:  Dg Chest 2 View  Result Date: 01/09/2018 CLINICAL DATA:  Chest pain. EXAM: CHEST  2 VIEW COMPARISON:  01/21/2017. FINDINGS: Mediastinum and hilar structures are normal. Cardiomegaly with normal pulmonary vascularity. No focal infiltrate. No pleural effusion or pneumothorax. Degenerative changes thoracic spine. IMPRESSION: Cardiomegaly with normal pulmonary vascularity. No focal infiltrate. Chest is stable from prior exam. Electronically Signed   By: Marcello Moores  Register   On: 01/09/2018 14:22    EKG:   Orders placed or performed during the hospital encounter of 01/09/18  . EKG 12-Lead  . EKG 12-Lead  . ED EKG within 10 minutes  .  ED EKG within 10 minutes  EKG shows normal sinus rhythm 84 bpm, no ST-T changes.  IMPRESSION AND PLAN:   #1 acute on chronic diastolic heart failure, usually follows up at [facility] heart failure clinic, patient complains of weight gain of 6 pounds in last 1 week, according to [facility] note patient supposed to continue torsemide 100 mg twice daily, eplerenone 50 mg daily and also advised to take metozalone and as instructed by heart failure clinic but he says he does not know any of that.  So far no he is admitted to telemetry, start on Lasix 60 mg IV every 6 hours, consult cardiology, nephrology for fluid management because of his heart failure and also CKD stage III.,  Check daily weights, continue  low-sodium diet, echocardiogram in [facility] in April 2018 showed EF 40-45%.   2.  Chronic diastolic heart failure, patient on beta-blockers, hydralazine.  Continue Coreg 25 mg twice daily, hydralazine 100 mg 3 times daily, Isordil 40 mg 3 times daily, eplerenone 50 mg daily as per the note. 3.  Essential hypertension: Controlled 4.  History of CAD with bare-metal stent in [facility] in 2016.  Patient on aspirin, statins, beta-blockers. 5.  Morbid obesity, hypoventilation syndrome, patient on BiPAP, 6.  Diabetes mellitus type 2 with diabetic neuropathy, long-term use of insulin.  Continue insulin with sliding scale coverage and also Lantus. 7. diffuse plaque psoriasis followed by [facility], patient received E with therapy, topical prednisone.    All the records are reviewed and case discussed with ED provider. Management plans discussed with the patient, family and they are in agreement.  CODE STATUS: full  TOTAL TIME TAKING CARE OF THIS PATIENT: 82minutes.    Epifanio Lesches M.D on 01/09/2018 at 5:54 PM  Between 7am to 6pm - Pager - [phone]  After 6pm go to [email or web address] - password [email or web address] [facility]  Office  [phone]  CC: Primary care physician; System, Pcp Not In  Note: This dictation was prepared with Dragon dictation along with smaller phrase technology. Any transcriptional errors that result from this process are unintentional.

## 2018-01-09 NOTE — ED Notes (Signed)
PT in NAD at time of transfer to floor, VS stable , RR even and unlabored

## 2018-01-09 NOTE — ED Triage Notes (Signed)
Pt with chest pain and leg swelling for a few days.

## 2018-01-09 NOTE — ED Provider Notes (Signed)
[facility] Emergency Department Provider Note  Time seen: 3:32 PM  I have reviewed the triage vital signs and the nursing notes.   HISTORY  Chief Complaint Chest Pain and Leg Swelling    HPI [name] is a 52 y.o. male with a past medical history of CHF, diabetes, hypertension, chronic hypoxia on supplemental oxygen 24/7, presents to the emergency department for increased pain and swelling in his legs.  Patient states over the past 3 days he has gained 6 or 7 pounds, is now having trouble breathing especially with any type of exertion.  States significant pain in all takes his extremities due to significant swelling.  Denies any fever.  States some cough with rare sputum production.  Denies any fever.  States chest tightness but denies any "pain."  States symptoms have been progressively worsening over the past 3 days. Past Medical History:  Diagnosis Date  . Bell's palsy   . CHF (congestive heart failure) ([place])   . Diabetes mellitus without complication ([place])   . Hypertension   . NSVT (nonsustained ventricular tachycardia) [facility])     Patient Active Problem List   Diagnosis Date Noted  . Edema 03/14/2017  . Diabetes mellitus without complication ([place]) 95/08/3266  . CAD (coronary artery disease) 01/31/2017  . Hyperlipidemia, unspecified 01/31/2017  . Essential hypertension 01/17/2017  . Psoriasis 01/17/2017  . Chronic gout of multiple sites 12/21/2016  . Acute on chronic systolic CHF (congestive heart failure), NYHA class 3 ([place]) 11/20/2016  . Acute respiratory failure with hypoxia ([place]) 11/20/2016  . Acute on chronic combined systolic and diastolic CHF (congestive heart failure) ([place]) 11/20/2016  . DM type 2 with diabetic peripheral neuropathy ([place]) 06/25/2016  . Coronary artery disease involving native coronary artery 06/14/2015  . Cardiomyopathy ([place]) 08/19/2012  . CKD (chronic kidney disease) stage 2, GFR 60-89 ml/min 08/19/2012    History  reviewed. No pertinent surgical history.  Prior to Admission medications   Medication Sig Start Date End Date Taking? Authorizing Provider  albuterol (PROVENTIL HFA;VENTOLIN HFA) 108 (90 Base) MCG/ACT inhaler Inhale 2 puffs into the lungs every 4 (four) hours as needed for wheezing or shortness of breath. 02/07/17   Odem, Coolidge Breeze, FNP  allopurinol (ZYLOPRIM) 300 MG tablet Take 1 tablet (300 mg total) by mouth daily. 02/14/17   Guadalupe Maple, MD  ammonium lactate (AMLACTIN) 12 % cream APPLY TOPICALLY AS NEEDED FOR DRY SKIN. 12/03/17   Gardiner Barefoot, DPM  aspirin EC 81 MG tablet Take 1 tablet (81 mg total) by mouth daily. 02/07/17   Boyce Medici, FNP  atorvastatin (LIPITOR) 40 MG tablet Take 1 tablet (40 mg total) by mouth at bedtime. 02/14/17   Guadalupe Maple, MD  carvedilol (COREG) 25 MG tablet Take 1 tablet (25 mg total) by mouth every 12 (twelve) hours. 02/14/17   Guadalupe Maple, MD  furosemide (LASIX) 40 MG tablet Take 1-2 tabs morning and noon 02/14/17   Guadalupe Maple, MD  gabapentin (NEURONTIN) 300 MG capsule Take 2 capsules (600 mg total) by mouth 3 (three) times daily. 02/14/17   Guadalupe Maple, MD  hydrALAZINE (APRESOLINE) 50 MG tablet Take 1 tablet (50 mg total) by mouth 3 (three) times daily. 02/14/17   Guadalupe Maple, MD  insulin aspart (NOVOLOG) 100 UNIT/ML injection Inject 20-35 Units into the skin 3 (three) times daily before meals. 02/28/17   Doles-Johnson, Teah, NP  insulin glargine (LANTUS) 100 UNIT/ML injection Inject 0.65-0.8 mLs (65-80 Units total)  into the skin at bedtime. 02/28/17   Doles-Johnson, Teah, NP  isosorbide dinitrate (ISORDIL) 20 MG tablet Take 1 tablet (20 mg total) by mouth 3 (three) times daily. 02/14/17   Guadalupe Maple, MD  omeprazole (PRILOSEC) 20 MG capsule Take 1 capsule (20 mg total) by mouth 2 (two) times daily before a meal. 02/14/17   Crissman, Jeannette How, MD  triamcinolone cream (KENALOG) 0.1 % Apply 1 application topically 2 (two) times daily. 02/14/17    Guadalupe Maple, MD  triamcinolone ointment (KENALOG) 0.5 % Apply 1 application topically 2 (two) times daily. 02/08/17   Tawni Millers, MD    No Known Allergies  Family History  Problem Relation Age of Onset  . Heart failure Mother   . Kidney failure Brother     Social History Social History   Tobacco Use  . Smoking status: Never Smoker  . Smokeless tobacco: Never Used  Substance Use Topics  . Alcohol use: Yes    Comment: very rarely  . Drug use: No    Review of Systems Constitutional: Negative for fever. Eyes: Negative for visual complaints ENT: Mild congestion Cardiovascular: Chest tightness Respiratory: Shortness of breath with exertion Gastrointestinal: Negative for abdominal pain, vomiting  Genitourinary: Negative for urinary compaints Musculoskeletal: Diffuse swelling in arms and legs Skin: Negative for skin complaints  Neurological: Negative for headache All other ROS negative  ____________________________________________   PHYSICAL EXAM:  VITAL SIGNS: ED Triage Vitals  Enc Vitals Group     BP 01/09/18 1346 (!) 145/83     Pulse Rate 01/09/18 1346 80     Resp 01/09/18 1346 20     Temp 01/09/18 1346 98.4 F (36.9 C)     Temp Source 01/09/18 1346 Oral     SpO2 01/09/18 1346 (!) 86 %     Weight 01/09/18 1347 296 lb (134.3 kg)     Height --      Head Circumference --      Peak Flow --      Pain Score 01/09/18 1347 8     Pain Loc --      Pain Edu? --      Excl. in [employer]? --     Constitutional: Alert and oriented. Well appearing and in no distress. Eyes: Normal exam ENT   Head: Normocephalic and atraumatic   Mouth/Throat: Mucous membranes are moist. Cardiovascular: Normal rate, regular rhythm.  Respiratory: Normal respiratory effort without tachypnea nor retractions. Breath sounds are clear  Gastrointestinal: Soft and nontender. No distention.  Musculoskeletal: Significant for plus lower extremity edema, significant upper extremity edema,  tenderness to palpation in all extremities.  Skin changes in lower extremities consistent with chronic venous stasis. Neurologic:  Normal speech and language. No gross focal neurologic deficits  Skin:  Skin is warm, dry.  Consistent with chronic venous stasis in lower extremities Psychiatric: Mood and affect are normal.   ____________________________________________    EKG  EKG reviewed and interpreted by myself shows normal sinus rhythm 84 bpm with a narrow QRS, normal axis, normal intervals, no concerning ST changes.  ____________________________________________    RADIOLOGY  Chest x-ray shows cardiomegaly but no pulmonary edema  ____________________________________________   INITIAL IMPRESSION / ASSESSMENT AND PLAN / ED COURSE  Pertinent labs & imaging results that were available during my care of the patient were reviewed by me and considered in my medical decision making (see chart for details).  Patient presents to the emergency department for pain in all of the extremities  due to increased swelling.  Patient also states chest tightness and shortness of breath with exertion all of his symptoms have been worsening for the past 3 days.  Also states a 7 pound weight gain over the past 3 days.  Is prescribed torsemide 100 mg twice daily, states he has been taking it 3 times daily for many weeks.  Patient follows up with [facility] heart failure clinic.  Differential at this time would include worsening peripheral edema, CHF exacerbation, pulmonary edema, ACS, electrolyte/metabolic abnormality.  We will check labs including cardiac panel, and continue to closely monitor.  We will dose pain medication for patient's discomfort while awaiting results.  I reviewed the patient's records he was seen at [facility] emergency department 01/04/18 for chest discomfort and lower extremity edema and pain.  Was ultimately discharged on Keflex to cover for any possible cellulitis.  Had a negative workup overall  including negative cardiac enzymes chest x-ray showing mild pulmonary edema.  Labs are largely at baseline.  Chronic renal insufficiency.  Troponin negative.  Given the significant degree of anasarca and patient discomfort will admit to the hospital for continued IV diuresis.  Patient agreeable to this plan of care.      ____________________________________________   FINAL CLINICAL IMPRESSION(S) / ED DIAGNOSES  Chest tightness Peripheral edema Anasarca   Harvest Dark, MD 01/09/18 1719

## 2018-01-10 LAB — BASIC METABOLIC PANEL
Anion gap: 10 (ref 5–15)
BUN: 26 mg/dL — ABNORMAL HIGH (ref 6–20)
CO2: 37 mmol/L — ABNORMAL HIGH (ref 22–32)
Calcium: 9 mg/dL (ref 8.9–10.3)
Chloride: 91 mmol/L — ABNORMAL LOW (ref 101–111)
Creatinine, Ser: 1.81 mg/dL — ABNORMAL HIGH (ref 0.61–1.24)
GFR calc Af Amer: 49 mL/min — ABNORMAL LOW (ref >60)
GFR calc non Af Amer: 42 mL/min — ABNORMAL LOW (ref >60)
Glucose, Bld: 210 mg/dL — ABNORMAL HIGH (ref 65–99)
Potassium: 4.7 mmol/L (ref 3.5–5.1)
Sodium: 138 mmol/L (ref 135–145)

## 2018-01-10 LAB — CBC
HCT: 35.5 % — ABNORMAL LOW (ref 40.0–52.0)
Hemoglobin: 11.2 g/dL — ABNORMAL LOW (ref 13.0–18.0)
MCH: 26.6 pg (ref 26.0–34.0)
MCHC: 31.6 g/dL — ABNORMAL LOW (ref 32.0–36.0)
MCV: 84.2 fL (ref 80.0–100.0)
Platelets: 265 10*3/uL (ref 150–440)
RBC: 4.22 MIL/uL — ABNORMAL LOW (ref 4.40–5.90)
RDW: 16.4 % — ABNORMAL HIGH (ref 11.5–14.5)
WBC: 12.6 10*3/uL — ABNORMAL HIGH (ref 3.8–10.6)

## 2018-01-10 LAB — GLUCOSE, CAPILLARY
Glucose-Capillary: 203 mg/dL — ABNORMAL HIGH (ref 65–99)
Glucose-Capillary: 291 mg/dL — ABNORMAL HIGH (ref 65–99)
Glucose-Capillary: 324 mg/dL — ABNORMAL HIGH (ref 65–99)
Glucose-Capillary: 352 mg/dL — ABNORMAL HIGH (ref 65–99)

## 2018-01-10 LAB — HEMOGLOBIN A1C
Hgb A1c MFr Bld: 10.3 % — ABNORMAL HIGH (ref 4.8–5.6)
Mean Plasma Glucose: 248.91 mg/dL

## 2018-01-10 MED ORDER — MORPHINE SULFATE (PF) 2 MG/ML IV SOLN
2.0000 mg | INTRAVENOUS | Status: DC | PRN
Start: 2018-01-10 — End: 2018-01-13
  Administered 2018-01-10 – 2018-01-13 (×6): 2 mg via INTRAVENOUS
  Filled 2018-01-10 (×6): qty 1

## 2018-01-10 MED ORDER — DIPHENHYDRAMINE HCL 25 MG PO TABS
25.0000 mg | ORAL_TABLET | Freq: Four times a day (QID) | ORAL | Status: DC | PRN
  Administered 2018-01-12 – 2018-01-13 (×2): 25 mg via ORAL
  Filled 2018-01-10 (×3): qty 1

## 2018-01-10 NOTE — Progress Notes (Signed)
Patient is a 52 year old male with known history of systolic heart failure, diabetes, essential HTN, obesity hypoventilation on BiPAP at night.  Patient presented to the ER with 6 pound weight gain in one week. Patient is followed at the [facility].  Patient is on 2 liters supplemental oxygen via [place] at all times.  Patient has hx of Bell's palsy, CHF, DM, HTN, NSVT.  Patient is a never smoker.   Patient sitting up in chair preparing to eat his evening meal.  Patient much more alert.  Patient informed this RN that he does not even remember me coming in earlier today.    CHF Education:   Educational session with patient completed.?? Provided patient with "Living Better with Heart Failure" packet. Briefly reviewed definition of heart failure and signs and symptoms of an exacerbation.  Discussed the meaning of EF and his preliminary value as compared to normal value.?Explained to patient that HF is a chronic illness which requires self-assessment / self-management along with help from the cardiologist/PCP/HF Clinic.? ?? *Reviewed importance of and reason behind checking weight daily in the AM, after using the bathroom, but before getting dressed.?Patient has scales.?? Patient informed this RN that he does weigh himself every morning and that was one indicator for him that something was not right.  Encouraged patient to continue?weighing himself again and explained the importance of doing so?along with assessing his symptoms. Instructed patient to notify physician earlier as he notices changes and not wait until he has gained 6 pounds in one week.   ? Reviewed the following information with patient:  *Discussed when to call the Dr= weight gain of >2-3lb overnight of 5lb in a week,  *Discussed yellow zone= call MD: weight gain of >2-3lb overnight of 5lb in a week, increased swelling, increased SOB when lying down, chest discomfort, dizziness, increased fatigue *Red Zone= call 911: struggle to  breath, fainting or near fainting, significant chest pain  ? *Reviewed low sodium diet-provided handout of recommended and not recommended foods.  Reviewed reading labels with patient. Discussed fluid intake with patient as well. Patient not currently on a fluid restriction, but advised no more than 8-8 ounces glass of fluids per day.?Note: ?Dietitian has seen and educated patient on low sodium heart healthy carb modified diet. ?  *Instructed patient to take medications as prescribed for heart failure. Explained briefly why pt is on the medications (either make you feel better, live longer or keep you out of the hospital) and discussed monitoring and side effects.   *Smoking Cessation - Patient is a never smoker.  ?? ? *Discussed the benefits of exercise.  Patient encouraged to remain as active as possible. Explained to patient that based on his EF per Medicare guidelines he is a NOT a candidate for Cardiac Rehab. ?However, patient is a candidate for Pulmonary Rehab ([facility]).  Brochure on Pulmonary Rehab provided.  Patient currently does not have a payor source. RNCM has spoken to patient about re-applying for Medicaid.  Patient plans to do this when he is discharged.   ? *[facility] Heart Failure Clinic- Explained the role of the [facility].  Explained to patient that the HF Clinic does not replace his PCP/cardiologist, but is an additional resource to help him manage his HF and to keep him out of the hospital.  Patient is followed int eh [facility], but is wanting to be followed locally, as he has trouble with transportation.?Appointment in the HF Clinic is  scheduled for 01/21/2018 at 12:20 a.m.  Patient encouraged to keep this appointment.   Once again, this RN reviewed the 5 Steps to Living Better with Heart Failure. Patient thanked me for providing the above information. ?  Roanna Epley, RN, BSN, [facility] Cardiovascular and Pulmonary Nurse Navigator

## 2018-01-10 NOTE — Progress Notes (Signed)
Nutrition Education Note  RD consulted for nutrition education regarding new onset CHF and DM.  52 year old male with known history of systolic heart failure, diabetes, essential HTN, obesity hypoventilation on BiPAP at night.  Patient presented to the ER with 6 pound weight gain in one week. Patient is followed at the [facility].  Patient is on 2 liters supplemental oxygen via [place] at all times.  Patient has hx of Bell's palsy, CHF, DM, HTN, NSVT.  Patient is a never smoker.    RD provided "Low Sodium Nutrition Therapy" handout from the Academy of Nutrition and Dietetics. Reviewed patient's dietary recall. Provided examples on ways to decrease sodium intake in diet. Discouraged intake of processed foods and use of salt shaker. Encouraged fresh fruits and vegetables as well as whole grain sources of carbohydrates to maximize fiber intake.   RD discussed why it is important for patient to adhere to diet recommendations, and emphasized the role of fluids, foods to avoid, and importance of weighing self daily.   RD also provided "Nutrition with Type II Diabetes" handout from the Academy of Nutrition and Dietetics. Discussed different food groups and their effects on blood sugar, emphasizing carbohydrate-containing foods. Provided list of carbohydrates and recommended serving sizes of common foods.  Discussed importance of controlled and consistent carbohydrate intake throughout the day. Provided examples of ways to balance meals/snacks and encouraged intake of high-fiber, whole grain complex carbohydrates.   Teach back method used.  Expect poor compliance.  Body mass index is 34.18 kg/m. Pt meets criteria for obesity based on current BMI.  Current diet order is HH/CHO, patient is consuming approximately 100% of meals at this time. Labs and medications reviewed. No further nutrition interventions warranted at this time. RD contact information provided. If additional nutrition issues  arise, please re-consult RD.   Koleen Distance MS, RD, LDN Pager #- [phone] After Hours Pager: [phone]

## 2018-01-10 NOTE — Plan of Care (Signed)
  Progressing Clinical Measurements: Respiratory complications will improve 01/10/2018 1118 - Progressing by Liliane Channel, RN Activity: Risk for activity intolerance will decrease 01/10/2018 1118 - Progressing by Liliane Channel, RN Pain Managment: General experience of comfort will improve 01/10/2018 1118 - Progressing by Liliane Channel, RN Skin Integrity: Risk for impaired skin integrity will decrease 01/10/2018 1118 - Progressing by Liliane Channel, RN

## 2018-01-10 NOTE — Progress Notes (Signed)
Pt refused BiPAP earlier this shift. VS taken at 0420 show O2 sats around 78%. Pt difficult to arouse and is not oriented to the situation Respiratory notified, pt put on BiPAP. O2 sats increased to 93%. Pt more alert and oriented at this time. Pt educated on risks and benefits of HS BiPAP. Will continue to monitor

## 2018-01-10 NOTE — Progress Notes (Addendum)
[place] at [place] NAME: [name]    MR#:  [id number]  DATE OF BIRTH:  [birth year]  SUBJECTIVE:  CHIEF COMPLAINT:   Chief Complaint  Patient presents with  . Chest Pain  . Leg Swelling     Came with worsening leg swelling, noted to have CHF. Responding properly to diuretics, he'll complain of significant pain in both legs because of swelling and he has itching all over his body.  REVIEW OF SYSTEMS:  CONSTITUTIONAL: No fever, fatigue or weakness.  EYES: No blurred or double vision.  EARS, NOSE, AND THROAT: No tinnitus or ear pain.  RESPIRATORY: No cough, shortness of breath, wheezing or hemoptysis.  CARDIOVASCULAR: No chest pain, orthopnea, edema.  GASTROINTESTINAL: No nausea, vomiting, diarrhea or abdominal pain.  GENITOURINARY: No dysuria, hematuria.  ENDOCRINE: No polyuria, nocturia,  HEMATOLOGY: No anemia, easy bruising or bleeding SKIN: No rash or lesion. MUSCULOSKELETAL: No joint pain or arthritis.   NEUROLOGIC: No tingling, numbness, weakness.  PSYCHIATRY: No anxiety or depression.   ROS  DRUG ALLERGIES:  No Known Allergies  VITALS:  Blood pressure 131/60, pulse 84, temperature 99.8 F (37.7 C), temperature source Oral, resp. rate 12, weight (!) 141 kg (310 lb 12.8 oz), SpO2 93 %.  PHYSICAL EXAMINATION:   GENERAL:  52 y.o.-year-old patient lying in the bed with no acute distress.  EYES: Pupils equal, round, reactive to light and accommodation. No scleral icterus. Extraocular muscles intact.  HEENT: Head atraumatic, normocephalic. Oropharynx and nasopharynx clear.  NECK:  Supple, no jugular venous distention. No thyroid enlargement, no tenderness.  LUNGS:coarse breath sounds bilaterally at bases cARDIOVASCULAR: S1, S2 normal. No murmurs, rubs, or gallops.  ABDOMEN: Soft, nontender, nondistended. Bowel sounds present. No organomegaly or mass.  EXTREMITIES: 3+ pitting edema, patient has chronic eczematous skin with  weeping.  Some blood also noted. NEUROLOGIC: Cranial nerves II through XII are intact. Muscle strength 5/5 in all extremities. Sensation intact. Gait not checked.  PSYCHIATRIC: The patient is alert and oriented x 3.  SKIN: No obvious rash, lesion, or ulcer.    Physical Exam LABORATORY PANEL:   CBC Recent Labs  Lab 01/10/18 0716  WBC 12.6*  HGB 11.2*  HCT 35.5*  PLT 265   ------------------------------------------------------------------------------------------------------------------  Chemistries  Recent Labs  Lab 01/10/18 0716  NA 138  K 4.7  CL 91*  CO2 37*  GLUCOSE 210*  BUN 26*  CREATININE 1.81*  CALCIUM 9.0   ------------------------------------------------------------------------------------------------------------------  Cardiac Enzymes Recent Labs  Lab 01/09/18 1427  TROPONINI <0.03   ------------------------------------------------------------------------------------------------------------------  RADIOLOGY:  Dg Chest 2 View  Result Date: 01/09/2018 CLINICAL DATA:  Chest pain. EXAM: CHEST  2 VIEW COMPARISON:  01/21/2017. FINDINGS: Mediastinum and hilar structures are normal. Cardiomegaly with normal pulmonary vascularity. No focal infiltrate. No pleural effusion or pneumothorax. Degenerative changes thoracic spine. IMPRESSION: Cardiomegaly with normal pulmonary vascularity. No focal infiltrate. Chest is stable from prior exam. Electronically Signed   By: Marcello Moores  Register   On: 01/09/2018 14:22    ASSESSMENT AND PLAN:   Active Problems:   Acute on chronic diastolic heart failure ([place])  #1 acute on chronic diastolic heart failure, usually follows up at [facility] heart failure clinic, patient complains of weight gain of 6 pounds in last 1 week, according to [facility] note patient supposed to continue torsemide 100 mg twice daily, eplerenone 50 mg daily and also advised to take metozalone and as instructed by heart failure clinic but he says he does not know  any of  that.    on Lasix 60 mg IV every 6 hours,  consult cardiology, nephrology if needed  also CKD stage III.,  Check daily weights, continue low-sodium diet, echocardiogram in [facility] in April 2018 showed EF 40-45%.   2.  Chronic diastolic heart failure, patient on beta-blockers, hydralazine.  Continue Coreg 25 mg twice daily, hydralazine 100 mg 3 times daily, Isordil 40 mg 3 times daily, eplerenone 50 mg daily as per the note. 3.  Essential hypertension: Controlled 4.  History of CAD with bare-metal stent in [facility] in 2016.  Patient on aspirin, statins, beta-blockers. 5.  Morbid obesity, hypoventilation syndrome, patient on BiPAP, 6.  Diabetes mellitus type 2 with diabetic neuropathy, long-term use of insulin.  Continue insulin with sliding scale coverage and also Lantus. 7. diffuse plaque psoriasis followed by [facility], patient received E with therapy, topical prednisone. 8. Itching   Benadryl oral. 9. CKD stage 3- stable, monitor with lasix.  All the records are reviewed and case discussed with Care Management/Social Workerr. Management plans discussed with the patient, family and they are in agreement.  CODE STATUS: Full.  TOTAL TIME TAKING CARE OF THIS PATIENT: 35 minutes.    POSSIBLE D/C IN 1-2 DAYS, DEPENDING ON CLINICAL CONDITION.   Vaughan Basta M.D on 01/10/2018   Between 7am to 6pm - Pager - [phone]  After 6pm go to [email or web address] - password [email or web address] [facility]  Office  [phone]  CC: Primary care physician; System, Pcp Not In  Note: This dictation was prepared with Dragon dictation along with smaller phrase technology. Any transcriptional errors that result from this process are unintentional.

## 2018-01-10 NOTE — Progress Notes (Signed)
Patient is a 52 year old male with known history of systolic heart failure, diabetes, essential HTN, obesity hypoventilation on BiPAP at night.  Patient presented to the ER with 6 pound weight gain in one week. Patient is followed at the [facility].  Patient is on 2 liters supplemental oxygen via [place] at all times.  Patient has hx of Bell's palsy, CHF, DM, HTN, NSVT.  Patient is a never smoker.    Patient lying in bed with HOB elevated.  Patient SOB when speaking and informed this RN that he is so uncomfortable and aching all over.  Patient informed this RN that he had been given oxycodone-acetaminophen 5-325 mg one tablet at 1018.  Patient reports just aching all over.  This RN informed patient's assigned RN, International aid/development worker of patient's complaint.  [place], RN to assess patient.    CHF Education not completed with patient due to patient's complaint of pain.  Patient is not ready to learn at this time. "Living Better with Heart Failure" packet left at bedside.       Youlanda Mighty, BSN, [facility] Cardiovascular and Pulmonary Nurse Navigator

## 2018-01-10 NOTE — Care Management Note (Signed)
Case Management Note  Patient Details  Name: [name] MRN: [id number] Date of Birth: 25-Aug-1966  Subjective/Objective:    Admitted to [facility] with the diagnosis of acute on chronic CHF. Lives alone. Friend is Tommie Mims [phone]). Sees Dr. Guadelupe Sabin in [place].  No home health. No skilled facility. Chronic home oxygen 2 liters per nasal cannula continuous. Oxygen per [facility] since December 2018.  Takes care of all basic activities of daily living himself, drives. States he is on disability.  Good appetite. No falls. Friend will transport                Action/Plan: Received referral for lost his medicaid. States he had medicaid the last time he was a patient at this hospital. (01/21/17). Discussed that he would need to go to the Department of social services  and re-apply  Expected Discharge Date:                  Expected Discharge Plan:     In-House Referral:   yes  Discharge planning Services     Post Acute Care Choice:    Choice offered to:     DME Arranged:    DME Agency:     HH Arranged:    [place] Agency:     Status of Service:     If discussed at [employer], dates discussed:    Additional Comments:  Shelbie Ammons, RN MSN CCM Care Management [phone] 01/10/2018, 8:42 AM

## 2018-01-10 NOTE — Progress Notes (Signed)
Inpatient Diabetes Program Recommendations  AACE/ADA: New Consensus Statement on Inpatient Glycemic Control (2015)  Target Ranges:  Prepandial:   less than 140 mg/dL      [place] postprandial:   less than 180 mg/dL (1-2 hours)      Critically ill patients:  140 - 180 mg/dL   Lab Results  Component Value Date   GLUCAP 291 (H) 01/10/2018   HGBA1C 9.1 (H) 02/14/2017    Review of Glycemic Control  Results for [name], [name] (MRN [id number]) as of 01/10/2018 15:21  Ref. Range 01/09/2018 21:25 01/10/2018 08:04 01/10/2018 11:52  Glucose-Capillary Latest Ref Range: 65 - 99 mg/dL 254 (H) 203 (H) 291 (H)    Diabetes history: Type 2 Outpatient Diabetes medications: Novolog 35 units tid, Lantus 90 units bid  Current orders for Inpatient glycemic control: Lantus 35 units at hs , Novolog 0-20 units tid  Inpatient Diabetes Program Recommendations: Consider increasing Lantus to 35 units bid, add Novolog 10 units tid and decrease Novolog correction to 0-15 units tid.  Consider adding Novolog 0-5 units qhs   Gentry Fitz, RN, [place], [place], CDE Diabetes Coordinator Inpatient Diabetes Program  [phone] (Team Pager) [phone] ([place]) 01/10/2018 3:25 PM

## 2018-01-11 LAB — HIV ANTIBODY (ROUTINE TESTING W REFLEX): HIV Screen 4th Generation wRfx: NONREACTIVE

## 2018-01-11 LAB — BLOOD GAS, ARTERIAL
Acid-Base Excess: 14.6 mmol/L — ABNORMAL HIGH (ref 0.0–2.0)
Bicarbonate: 44.3 mmol/L — ABNORMAL HIGH (ref 20.0–28.0)
Delivery systems: POSITIVE
Expiratory PAP: 6
FIO2: 100
Inspiratory PAP: 18
Mechanical Rate: 20
O2 Saturation: 99.9 %
Patient temperature: 37
pCO2 arterial: 84 mmHg (ref 32.0–48.0)
pH, Arterial: 7.33 — ABNORMAL LOW (ref 7.350–7.450)
pO2, Arterial: 283 mmHg — ABNORMAL HIGH (ref 83.0–108.0)

## 2018-01-11 LAB — BASIC METABOLIC PANEL
Anion gap: 7 (ref 5–15)
BUN: 33 mg/dL — ABNORMAL HIGH (ref 6–20)
CO2: 40 mmol/L — ABNORMAL HIGH (ref 22–32)
Calcium: 8.8 mg/dL — ABNORMAL LOW (ref 8.9–10.3)
Chloride: 89 mmol/L — ABNORMAL LOW (ref 101–111)
Creatinine, Ser: 2.02 mg/dL — ABNORMAL HIGH (ref 0.61–1.24)
GFR calc Af Amer: 43 mL/min — ABNORMAL LOW (ref >60)
GFR calc non Af Amer: 37 mL/min — ABNORMAL LOW (ref >60)
Glucose, Bld: 247 mg/dL — ABNORMAL HIGH (ref 65–99)
Potassium: 4.5 mmol/L (ref 3.5–5.1)
Sodium: 136 mmol/L (ref 135–145)

## 2018-01-11 LAB — GLUCOSE, CAPILLARY
Glucose-Capillary: 232 mg/dL — ABNORMAL HIGH (ref 65–99)
Glucose-Capillary: 280 mg/dL — ABNORMAL HIGH (ref 65–99)
Glucose-Capillary: 282 mg/dL — ABNORMAL HIGH (ref 65–99)
Glucose-Capillary: 286 mg/dL — ABNORMAL HIGH (ref 65–99)
Glucose-Capillary: 305 mg/dL — ABNORMAL HIGH (ref 65–99)

## 2018-01-11 MED ORDER — FUROSEMIDE 10 MG/ML IJ SOLN
60.0000 mg | Freq: Three times a day (TID) | INTRAMUSCULAR | Status: DC
  Administered 2018-01-11 – 2018-01-13 (×6): 60 mg via INTRAVENOUS
  Filled 2018-01-11 (×6): qty 6

## 2018-01-11 MED ORDER — NITROGLYCERIN 0.4 MG SL SUBL
SUBLINGUAL_TABLET | SUBLINGUAL | Status: AC
  Administered 2018-01-11: 0.4 mg
  Filled 2018-01-11: qty 1

## 2018-01-11 MED ORDER — INSULIN ASPART 100 UNIT/ML ~~LOC~~ SOLN
0.0000 [IU] | Freq: Every day | SUBCUTANEOUS | Status: DC
Start: 2018-01-11 — End: 2018-01-14
  Administered 2018-01-11: 4 [IU] via SUBCUTANEOUS
  Administered 2018-01-13: 21:00:00 2 [IU] via SUBCUTANEOUS
  Filled 2018-01-11 (×3): qty 1

## 2018-01-11 MED ORDER — INSULIN ASPART 100 UNIT/ML ~~LOC~~ SOLN: 0.0000 [IU] | Freq: Every day | SUBCUTANEOUS | Status: DC

## 2018-01-11 MED ORDER — INSULIN ASPART 100 UNIT/ML ~~LOC~~ SOLN
10.0000 [IU] | Freq: Three times a day (TID) | SUBCUTANEOUS | Status: DC
  Administered 2018-01-11 – 2018-01-13 (×8): 10 [IU] via SUBCUTANEOUS
  Filled 2018-01-11 (×7): qty 1

## 2018-01-11 MED ORDER — INSULIN ASPART 100 UNIT/ML ~~LOC~~ SOLN
0.0000 [IU] | Freq: Three times a day (TID) | SUBCUTANEOUS | Status: DC
  Administered 2018-01-11 (×2): 11 [IU] via SUBCUTANEOUS
  Filled 2018-01-11 (×3): qty 1

## 2018-01-11 MED ORDER — SODIUM CHLORIDE 0.9% FLUSH
3.0000 mL | Freq: Two times a day (BID) | INTRAVENOUS | Status: DC
  Administered 2018-01-11 – 2018-01-14 (×7): 3 mL via INTRAVENOUS

## 2018-01-11 MED ORDER — INSULIN GLARGINE 100 UNIT/ML ~~LOC~~ SOLN
35.0000 [IU] | Freq: Two times a day (BID) | SUBCUTANEOUS | Status: DC
  Administered 2018-01-11 – 2018-01-13 (×4): 35 [IU] via SUBCUTANEOUS
  Filled 2018-01-11 (×6): qty 0.35

## 2018-01-11 MED ORDER — INSULIN ASPART 100 UNIT/ML ~~LOC~~ SOLN
0.0000 [IU] | Freq: Three times a day (TID) | SUBCUTANEOUS | Status: DC
  Administered 2018-01-12: 5 [IU] via SUBCUTANEOUS
  Administered 2018-01-12: 3 [IU] via SUBCUTANEOUS
  Administered 2018-01-12 – 2018-01-13 (×2): 8 [IU] via SUBCUTANEOUS
  Administered 2018-01-13: 3 [IU] via SUBCUTANEOUS
  Administered 2018-01-13 – 2018-01-14 (×2): 5 [IU] via SUBCUTANEOUS
  Administered 2018-01-14: 09:00:00 3 [IU] via SUBCUTANEOUS
  Filled 2018-01-11 (×8): qty 1

## 2018-01-11 NOTE — Progress Notes (Signed)
Pt is refusing chair alarm. Educated on safety precautions and fall prevention. Pt still does not want chair alarm at this time because he has to stand to use his urinal and he has frequency/urgency. Requested that he call the nursing staff if he needed to ambulate to restroom. Will continue to monitor.

## 2018-01-11 NOTE — Progress Notes (Signed)
PT Cancellation Note  Patient Details Name: [name] MRN: [id number] DOB: Apr 27, 1966   Cancelled Treatment:    Reason Eval/Treat Not Completed: (Consult received and chart reviewed.  Patient politely declined participation at this time due to fatigue.  Reports having a "rough night" and doesn't feel up to exertional activity this date.  Prefers therapist re-attempt next date.  Will continue efforts 01/11/18 as appropriate.)   Kristen H. Owens Shark, PT, DPT, NCS 01/11/18, 2:51 PM [phone]

## 2018-01-11 NOTE — Progress Notes (Addendum)
Called to room by RN IN [place] OF THE PT , PT SAT BELOW  88 % PLACED PT ON BIPAP , ABG DRAWN AND NOTIFIED RN , , SAT IMPROVED , PT BACK IN THE BED RESTING COMFORTABLY,WILL REPEAT ABG IN AM. POST ABG FIO2 TO 40 TOLERATING WELL.

## 2018-01-11 NOTE — Progress Notes (Signed)
Went to assess patient and retrieve vitals signs. Pulse ox was in the 50's and 60's while on CPAP. Applied non rebreather at 15 liters and notified the respiratory therapist. Patient was drowsy but arouseable. Notified Dr. Jannifer Franklin of current condition and received an order for a bipap and stat ABG.  Patient was tremoring and complaining that he was cold. Patient was diaphoretic, obtained a glucose POCT. POCT was 282. Moved patient from recliner to the bed and he stop responding to my questions. Placed patient flat in the bed and he began complaining of chest pain 10/10. Obtained vitals and administered 1 sublingual nitroglycerin. Respiratory therapist applied Bipap and retrieved an ABG. ABG values were abnormal and Dr.Willis followed up with new orders.  At 0130 patient is resting in bed with Bipap applied and without any complaints of pain. Will continue to monitor and assess.

## 2018-01-11 NOTE — Progress Notes (Signed)
[place] at [place] NAME: [name]    MR#:  [id number]  DATE OF BIRTH:  03/28/1966  SUBJECTIVE:  CHIEF COMPLAINT:   Chief Complaint  Patient presents with  . Chest Pain  . Leg Swelling   Better shortness of breath, on oxygen 3 L. He was put on BiPAP due to hypercapnia last night. REVIEW OF SYSTEMS:  CONSTITUTIONAL: No fever, fatigue or weakness.  EYES: No blurred or double vision.  EARS, NOSE, AND THROAT: No tinnitus or ear pain.  RESPIRATORY: No cough, has shortness of breath, no wheezing or hemoptysis.  CARDIOVASCULAR: No chest pain, orthopnea, has leg edema.  GASTROINTESTINAL: No nausea, vomiting, diarrhea or abdominal pain.  GENITOURINARY: No dysuria, hematuria.  ENDOCRINE: No polyuria, nocturia,  HEMATOLOGY: No anemia, easy bruising or bleeding SKIN: No rash or lesion. MUSCULOSKELETAL: No joint pain or arthritis.   NEUROLOGIC: No tingling, numbness, weakness.  PSYCHIATRY: No anxiety or depression.   ROS  DRUG ALLERGIES:  No Known Allergies  VITALS:  Blood pressure (!) 112/59, pulse 86, temperature 99.1 F (37.3 C), temperature source Oral, resp. rate 18, weight (!) 310 lb 14.4 oz (141 kg), SpO2 92 %.  PHYSICAL EXAMINATION:   GENERAL:  52 y.o.-year-old patient lying in the bed with no acute distress.  Morbid obesity. EYES: Pupils equal, round, reactive to light and accommodation. No scleral icterus. Extraocular muscles intact.  HEENT: Head atraumatic, normocephalic. Oropharynx and nasopharynx clear.  NECK:  Supple, no jugular venous distention. No thyroid enlargement, no tenderness.  LUNGS:coarse breath sounds bilaterally at bases cARDIOVASCULAR: S1, S2 normal. No murmurs, rubs, or gallops.  ABDOMEN: Soft, nontender, nondistended. Bowel sounds present. No organomegaly or mass.  EXTREMITIES: 3+ pitting edema, patient has chronic eczematous skin with weeping.  Some blood also noted. NEUROLOGIC: Cranial nerves II  through XII are intact. Muscle strength 5/5 in all extremities. Sensation intact. Gait not checked.  PSYCHIATRIC: The patient is alert and oriented x 3.  SKIN: No obvious rash, lesion, or ulcer.    Physical Exam LABORATORY PANEL:   CBC Recent Labs  Lab 01/10/18 0716  WBC 12.6*  HGB 11.2*  HCT 35.5*  PLT 265   ------------------------------------------------------------------------------------------------------------------  Chemistries  Recent Labs  Lab 01/11/18 0436  NA 136  K 4.5  CL 89*  CO2 40*  GLUCOSE 247*  BUN 33*  CREATININE 2.02*  CALCIUM 8.8*   ------------------------------------------------------------------------------------------------------------------  Cardiac Enzymes Recent Labs  Lab 01/09/18 1427  TROPONINI <0.03   ------------------------------------------------------------------------------------------------------------------  RADIOLOGY:  No results found.  ASSESSMENT AND PLAN:   Active Problems:   Acute on chronic diastolic heart failure (HCC)  #1 acute on chronic diastolic heart failure, usually follows up at [facility] heart failure clinic, patient complains of weight gain of 6 pounds in last 1 week, according to [facility] note patient supposed to continue torsemide 100 mg twice daily, eplerenone 50 mg daily and also advised to take metozalone and as instructed by heart failure clinic but he says he does not know any of that.    on Lasix 60 mg IV every 6 hours, decreased to every 8 hours.  consult cardiology, nephrology if needed  also CKD stage III.,  Check daily weights, continue low-sodium diet, echocardiogram in [facility] in April 2018 showed EF 40-45%.   2.  Chronic diastolic heart failure, patient on beta-blockers, hydralazine.  Continue Coreg 25 mg twice daily, hydralazine 100 mg 3 times daily, Isordil 40 mg 3 times daily, eplerenone 50 mg daily as  per the note.  3.  Essential hypertension: Controlled 4.  History of CAD with bare-metal stent in [facility]  in 2016.  Patient on aspirin, statins, beta-blockers. 5.  Morbid obesity, hypoventilation syndrome, patient on BiPAP, 6.  Diabetes mellitus type 2 with diabetic neuropathy, long-term use of insulin.  Continue insulin with sliding scale coverage,  Lantus 35 units twice daily.  Add NovoLog 10 unit AC. 7. diffuse plaque psoriasis followed by [facility], patient received E with therapy, topical prednisone.  8. Itching   Benadryl oral. 9. CKD stage 3- stable, monitor with lasix.  All the records are reviewed and case discussed with Care Management/Social Workerr. Management plans discussed with the patient, family and they are in agreement.  CODE STATUS: Full.  TOTAL TIME TAKING CARE OF THIS PATIENT: 35 minutes.    POSSIBLE D/C IN 1-2 DAYS, DEPENDING ON CLINICAL CONDITION.   Demetrios Loll M.D on 01/11/2018   Between 7am to 6pm - Pager - [phone]  After 6pm go to [email or web address] - password [email or web address] [place] [facility]  Office  [phone]  CC: Primary care physician; System, Pcp Not In  Note: This dictation was prepared with Dragon dictation along with smaller phrase technology. Any transcriptional errors that result from this process are unintentional.

## 2018-01-12 ENCOUNTER — Inpatient Hospital Stay: Payer: BLUE CROSS/BLUE SHIELD

## 2018-01-12 DIAGNOSIS — J9622 Acute and chronic respiratory failure with hypercapnia: Secondary | ICD-10-CM

## 2018-01-12 LAB — BLOOD GAS, ARTERIAL
Acid-Base Excess: 16.4 mmol/L — ABNORMAL HIGH (ref 0.0–2.0)
Acid-Base Excess: 18.6 mmol/L — ABNORMAL HIGH (ref 0.0–2.0)
Allens test (pass/fail): POSITIVE — AB
Bicarbonate: 45.3 mmol/L — ABNORMAL HIGH (ref 20.0–28.0)
Bicarbonate: 46.3 mmol/L — ABNORMAL HIGH (ref 20.0–28.0)
Delivery systems: POSITIVE
Expiratory PAP: 6
FIO2: 0.4
FIO2: 0.36
Inspiratory PAP: 18
O2 Saturation: 94.4 %
O2 Saturation: 91.6 %
Patient temperature: 37
Patient temperature: 37
RATE: 8 resp/min
pCO2 arterial: 82 mmHg (ref 32.0–48.0)
pCO2 arterial: 73 mmHg (ref 32.0–48.0)
pH, Arterial: 7.35 (ref 7.350–7.450)
pH, Arterial: 7.41 (ref 7.350–7.450)
pO2, Arterial: 76 mmHg — ABNORMAL LOW (ref 83.0–108.0)
pO2, Arterial: 62 mmHg — ABNORMAL LOW (ref 83.0–108.0)

## 2018-01-12 LAB — BASIC METABOLIC PANEL
Anion gap: 7 (ref 5–15)
BUN: 37 mg/dL — ABNORMAL HIGH (ref 6–20)
CO2: 40 mmol/L — ABNORMAL HIGH (ref 22–32)
Calcium: 8.7 mg/dL — ABNORMAL LOW (ref 8.9–10.3)
Chloride: 86 mmol/L — ABNORMAL LOW (ref 101–111)
Creatinine, Ser: 1.86 mg/dL — ABNORMAL HIGH (ref 0.61–1.24)
GFR calc Af Amer: 47 mL/min — ABNORMAL LOW (ref >60)
GFR calc non Af Amer: 41 mL/min — ABNORMAL LOW (ref >60)
Glucose, Bld: 298 mg/dL — ABNORMAL HIGH (ref 65–99)
Potassium: 4.5 mmol/L (ref 3.5–5.1)
Sodium: 133 mmol/L — ABNORMAL LOW (ref 135–145)

## 2018-01-12 LAB — MRSA PCR SCREENING: MRSA by PCR: NEGATIVE

## 2018-01-12 LAB — GLUCOSE, CAPILLARY
Glucose-Capillary: 257 mg/dL — ABNORMAL HIGH (ref 65–99)
Glucose-Capillary: 185 mg/dL — ABNORMAL HIGH (ref 65–99)
Glucose-Capillary: 160 mg/dL — ABNORMAL HIGH (ref 65–99)
Glucose-Capillary: 165 mg/dL — ABNORMAL HIGH (ref 65–99)

## 2018-01-12 LAB — MAGNESIUM: Magnesium: 1.9 mg/dL (ref 1.7–2.4)

## 2018-01-12 LAB — TROPONIN I
Troponin I: <0.03 ng/mL (ref <0.03)
Troponin I: <0.03 ng/mL (ref <0.03)

## 2018-01-12 MED ORDER — NITROGLYCERIN 0.4 MG SL SUBL
SUBLINGUAL_TABLET | SUBLINGUAL | Status: AC
  Filled 2018-01-12: qty 1

## 2018-01-12 MED ORDER — CLOPIDOGREL BISULFATE 75 MG PO TABS: 75.0000 mg | ORAL_TABLET | Freq: Every day | ORAL | Status: DC

## 2018-01-12 MED ORDER — NITROGLYCERIN 0.4 MG SL SUBL
0.4000 mg | SUBLINGUAL_TABLET | SUBLINGUAL | Status: DC | PRN
  Administered 2018-01-12: 0.4 mg via SUBLINGUAL

## 2018-01-12 NOTE — Progress Notes (Addendum)
[place] at [place] NAME: [name]    MR#:  [id number]  DATE OF BIRTH:  17-Apr-1966  SUBJECTIVE:  CHIEF COMPLAINT:   Chief Complaint  Patient presents with  . Chest Pain  . Leg Swelling   The patient still complains of shortness of breath this morning, he was on oxygen by nasal cannula 4 L this morning.  Later, he was found laying on the floor on the right side with oxygen off.  He is not sure if he loss of consciousness but he complains of chest pain and bilateral leg pain, which is chronic.  He was already given aspirin in the morning.  He was found hypoxia with O2 saturation at 20s and placed on nonrebreather oxygen.  Troponin level is less than 0.03. REVIEW OF SYSTEMS:  CONSTITUTIONAL: No fever, has generalized weakness.  EYES: No blurred or double vision.  EARS, NOSE, AND THROAT: No tinnitus or ear pain.  RESPIRATORY: No cough, has shortness of breath, no wheezing or hemoptysis.  CARDIOVASCULAR: No chest pain, orthopnea, has leg edema.  GASTROINTESTINAL: No nausea, vomiting, diarrhea or abdominal pain.  GENITOURINARY: No dysuria, hematuria.  ENDOCRINE: No polyuria, nocturia,  HEMATOLOGY: No anemia, easy bruising or bleeding SKIN: No rash or lesion. MUSCULOSKELETAL: No joint pain or arthritis.   NEUROLOGIC: No tingling, numbness, weakness.  PSYCHIATRY: No anxiety or depression.   ROS  DRUG ALLERGIES:  No Known Allergies  VITALS:  Blood pressure (!) 146/85, pulse 87, temperature 97.8 F (36.6 C), temperature source Oral, resp. rate 20, weight (!) 311 lb 1.6 oz (141.1 kg), SpO2 99 %.  PHYSICAL EXAMINATION:   GENERAL:  52 y.o.-year-old patient lying in the bed with no acute distress.  Morbid obesity. EYES: Pupils equal, round, reactive to light and accommodation. No scleral icterus. Extraocular muscles intact.  HEENT: Head atraumatic, normocephalic. Oropharynx and nasopharynx clear.  NECK:  Supple, no jugular venous  distention. No thyroid enlargement, no tenderness.  LUNGS:coarse breath sounds bilaterally at bases cARDIOVASCULAR: S1, S2 normal. No murmurs, rubs, or gallops.  ABDOMEN: Soft, nontender, nondistended. Bowel sounds present. No organomegaly or mass.  EXTREMITIES: 3+ pitting edema, patient has chronic eczematous skin with weeping.   NEUROLOGIC: Cranial nerves II through XII are intact. Muscle strength 4/5 in all extremities. Sensation intact. Gait not checked.  PSYCHIATRIC: The patient is alert and oriented x 3.  SKIN: No obvious rash, lesion, or ulcer.    Physical Exam LABORATORY PANEL:   CBC Recent Labs  Lab 01/10/18 0716  WBC 12.6*  HGB 11.2*  HCT 35.5*  PLT 265   ------------------------------------------------------------------------------------------------------------------  Chemistries  Recent Labs  Lab 01/12/18 0426  NA 133*  K 4.5  CL 86*  CO2 40*  GLUCOSE 298*  BUN 37*  CREATININE 1.86*  CALCIUM 8.7*  MG 1.9   ------------------------------------------------------------------------------------------------------------------  Cardiac Enzymes Recent Labs  Lab 01/09/18 1427 01/12/18 1205  TROPONINI <0.03 <0.03   ------------------------------------------------------------------------------------------------------------------  RADIOLOGY:  No results found.  ASSESSMENT AND PLAN:   Active Problems:   Acute on chronic diastolic heart failure (HCC)  #1  Acute on chronic respiratory failure with hypoxia and hypercapnia due to acute on chronic diastolic heart failure and hypoventilation syndrome. He usually follows up at [facility] heart failure clinic, patient complains of weight gain of 6 pounds in last 1 week, according to [facility] note patient supposed to continue torsemide 100 mg twice daily, eplerenone 50 mg daily and also advised to take metozalone and as instructed by  heart failure clinic but he says he does not know any of that.    on Lasix 60 mg IV every 6 hours,  decreased to every 8 hours. daily weights, continue low-sodium diet, echocardiogram in [facility] in April 2018 showed EF 40-45%. Cardiology consult. He is on nonrebreather then put on BIPAP, need to be transferred to stepdown unit to continue BiPAP.   2. Acute on chronic diastolic heart failure, patient on beta-blockers, hydralazine.  Continue lasix iv,  Coreg 25 mg twice daily, hydralazine 100 mg 3 times daily, Isordil 40 mg 3 times daily, eplerenone 50 mg daily as per the note.  3.  Essential hypertension: Controlled 4.  History of CAD with bare-metal stent in [facility] in 2016.  Patient on aspirin, statins, beta-blockers. 5.  Morbid obesity, hypoventilation syndrome, patient on BiPAP at night, 6.  Diabetes mellitus type 2 with diabetic neuropathy, long-term use of insulin.  Continue insulin with sliding scale coverage,  Lantus 35 units twice daily.  Added NovoLog 10 unit AC. 7. diffuse plaque psoriasis followed by [facility], patient received E with therapy, topical prednisone.  8. Itching   Benadryl oral. 9. CKD stage 3- stable, monitor with lasix.  Chest pain.  Troponin level is less than 0.03.  Continue aspirin and statin.  Nitroglycerin as needed.  Fall.  CAT scan of the head.  Fall precaution.  Generalized weakness.  PT evaluation.  I discussed with intensivist Dr. Jefferson Fuel. All the records are reviewed and case discussed with Care Management/Social Workerr. Management plans discussed with the patient, family and they are in agreement.  CODE STATUS: Full.  TOTAL CRITICAL TIME TAKING CARE OF THIS PATIENT: 48 minutes.    POSSIBLE D/C IN 2 DAYS, DEPENDING ON CLINICAL CONDITION.   Demetrios Loll M.D on 01/12/2018   Between 7am to 6pm - Pager - [phone]  After 6pm go to [email or web address] - password [email or web address] [place] [facility]  Office  [phone]  CC: Primary care physician; System, Pcp Not In  Note: This dictation was prepared with Dragon dictation along with smaller phrase  technology. Any transcriptional errors that result from this process are unintentional.

## 2018-01-12 NOTE — Progress Notes (Addendum)
Pt found lying in the floor on his right side with oxygen off per MD who was walking past the room. The pt had been sitting up in the recliner all morning and refused the chair alarm yesterday (see note) and today because he is on IV Lasix and has frequency and must stand to use his urinal. The patient was somewhat lethargic and complaining of chest pain and bilateral leg pain. Pt could not recall events leading up to the fall out of the chair. He is unsure if he loss consciousness or hit his head. Vital signs obtained. SpO2 in the 20's and pt placed on nonrebreather. Pt assessed for any visible signs of injury and none were noted. Helped the patient back to bed. Respiratory therapist in to assess pt. Pt placed on BiPap. SL nitro administered X2 for chest pain with relief. EKG obtained. Dr Bridgett Larsson paged and notified of events. He came to the room to assess pt. Troponin and Plavix ordered. Questioned if CT of head should be ordered considering it was unknown whether pt hit his head. Pt also complains of bilateral leg pain, which he has at baseline, but he could not describe the pain to this RN. No orders received in relation to the leg pain. Dr. Bridgett Larsson stated he did not feel that a head CT was necessary. This RN had concerns with administering the Plavix, without having a CT scan. Pt responds to voice, but has a hard time maintaining conversation and RN has to repeat questions multiple times before receiving answer. Pt is in bed, still on Bipap at this time. VSS. Awaiting ABG results. Bed alarm activated. Will continue to monitor.   Edited to add: CT scan has now been ordered. 1250  Edited to add: Pt transferred to ICU room 4. Offered to call family for the pt to let them know that he was being transferred. At first he denied, then gave the name of a friend who's number was not listed in the chart. The chart only has phone numbers of two friends listed in the chart. Informed the CCU nurse that I was unable to call  anyone to inform them of his transfer. CCU nurse stated she would attempt to reach someone. [place]

## 2018-01-12 NOTE — Progress Notes (Signed)
Patient awake out of bed. BIPAP on standby. Will call when ready to be placed on BIPAP.

## 2018-01-12 NOTE — Consult Note (Signed)
Reason for Consult: Study failure congestive heart failure hypoxemia Referring Physician: Dr. Vianne Bulls hospitalist physician at [facility] is an 52 y.o. male.  HPI: Patient presents with history of systolic heart failure diabetes hypertension obesity hypoventilation on BiPAP at home at night.  Patient was brought to the emergency room with worsening lower extremity edema tightness abdominal discomfort in legs weight gain possible anasarca and heart failure patient is on supplemental oxygen 2 L at home 24 hours a day.  Patient is usually followed at [facility] heart failure clinic was seen a month ago.  Patient was at [facility] emergency room January 19 with chest pain but was discharged after limited evaluation.  Patient has diffuse psoriasis complaint of right-sided chest pain evaluated in the emergency room now presents with what appears to be respiratory failure  Past Medical History:  Diagnosis Date  . Bell's palsy   . CHF (congestive heart failure) ([place])   . Diabetes mellitus without complication ([place])   . Hypertension   . NSVT (nonsustained ventricular tachycardia) ([place])     History reviewed. No pertinent surgical history.  Family History  Problem Relation Age of Onset  . Heart failure Mother   . Kidney failure Brother     Social History:  reports that  has never smoked. he has never used smokeless tobacco. He reports that he drinks alcohol. He reports that he does not use drugs.  Allergies: No Known Allergies  Medications: Medications reviewed please see H&P for complete medication list  Results for orders placed or performed during the hospital encounter of 01/09/18 (from the past 48 hour(s))  Glucose, capillary     Status: Abnormal   Collection Time: 01/10/18  9:49 PM  Result Value Ref Range   Glucose-Capillary 352 (H) 65 - 99 mg/dL  Blood gas, arterial     Status: Abnormal   Collection Time: 01/11/18 12:31 AM  Result Value Ref Range   FIO2 100.00    Delivery systems  BILEVEL POSITIVE AIRWAY PRESSURE    Inspiratory PAP 18    Expiratory PAP 6    pH, Arterial 7.33 (L) 7.350 - 7.450   pCO2 arterial 84 (HH) 32.0 - 48.0 mmHg    Comment: CRITICAL RESULT CALLED TO, READ BACK BY AND VERIFIED WITH: MARSHA TURNER [id number] '@0052'$  SKM    pO2, Arterial 283 (H) 83.0 - 108.0 mmHg   Bicarbonate 44.3 (H) 20.0 - 28.0 mmol/L   Acid-Base Excess 14.6 (H) 0.0 - 2.0 mmol/L   O2 Saturation 99.9 %   Patient temperature 37.0    Collection site RIGHT RADIAL    Sample type ARTERIAL DRAW    Allens test (pass/fail) PASS PASS   Mechanical Rate 20     Comment: Performed at [facility], [address] [place], [place] [address]  Glucose, capillary     Status: Abnormal   Collection Time: 01/11/18 12:37 AM  Result Value Ref Range   Glucose-Capillary 282 (H) 65 - 99 mg/dL   Comment 1 Notify RN    Comment 2 Document in Chart   Basic metabolic panel     Status: Abnormal   Collection Time: 01/11/18  4:36 AM  Result Value Ref Range   Sodium 136 135 - 145 mmol/L   Potassium 4.5 3.5 - 5.1 mmol/L   Chloride 89 (L) 101 - 111 mmol/L   CO2 40 (H) 22 - 32 mmol/L   Glucose, Bld 247 (H) 65 - 99 mg/dL   BUN 33 (H) 6 - 20  mg/dL   Creatinine, Ser 2.02 (H) 0.61 - 1.24 mg/dL   Calcium 8.8 (L) 8.9 - 10.3 mg/dL   GFR calc non Af Amer 37 (L) >60 mL/min   GFR calc Af Amer 43 (L) >60 mL/min    Comment: (NOTE) The eGFR has been calculated using the CKD EPI equation. This calculation has not been validated in all clinical situations. eGFR's persistently <60 mL/min signify possible Chronic Kidney Disease.    Anion gap 7 5 - 15    Comment: Performed at [facility], [address] [place], [place] [address]  Blood gas, arterial     Status: Abnormal (Preliminary result)   Collection Time: 01/11/18  5:01 AM  Result Value Ref Range   FIO2 40.00    Delivery systems BILEVEL POSITIVE AIRWAY PRESSURE    pH, Arterial 7.35 7.350 - 7.450   pCO2 arterial 78 (HH) 32.0 - 48.0 mmHg     Comment: CRITICAL RESULT CALLED TO, READ BACK BY AND VERIFIED WITH: MARSHA TURNER RNAT [id number] SKM    pO2, Arterial 73 (L) 83.0 - 108.0 mmHg   Bicarbonate 43.1 (H) 20.0 - 28.0 mmol/L   Acid-Base Excess 14.3 (H) 0.0 - 2.0 mmol/L   O2 Saturation 93.7 %   Patient temperature 37.0    Collection site RIGHT RADIAL    Sample type ARTERIAL DRAW    Allens test (pass/fail) PENDING PASS   Mechanical Rate 20     Comment: Performed at [facility], [address] [place], [place] [address]  Glucose, capillary     Status: Abnormal   Collection Time: 01/11/18  8:19 AM  Result Value Ref Range   Glucose-Capillary 232 (H) 65 - 99 mg/dL   Comment 1 Notify RN   Glucose, capillary     Status: Abnormal   Collection Time: 01/11/18 11:38 AM  Result Value Ref Range   Glucose-Capillary 286 (H) 65 - 99 mg/dL   Comment 1 Notify RN   Glucose, capillary     Status: Abnormal   Collection Time: 01/11/18  4:42 PM  Result Value Ref Range   Glucose-Capillary 280 (H) 65 - 99 mg/dL   Comment 1 Notify RN   Glucose, capillary     Status: Abnormal   Collection Time: 01/11/18  9:41 PM  Result Value Ref Range   Glucose-Capillary 305 (H) 65 - 99 mg/dL  Basic metabolic panel     Status: Abnormal   Collection Time: 01/12/18  4:26 AM  Result Value Ref Range   Sodium 133 (L) 135 - 145 mmol/L   Potassium 4.5 3.5 - 5.1 mmol/L   Chloride 86 (L) 101 - 111 mmol/L   CO2 40 (H) 22 - 32 mmol/L   Glucose, Bld 298 (H) 65 - 99 mg/dL   BUN 37 (H) 6 - 20 mg/dL   Creatinine, Ser 1.86 (H) 0.61 - 1.24 mg/dL   Calcium 8.7 (L) 8.9 - 10.3 mg/dL   GFR calc non Af Amer 41 (L) >60 mL/min   GFR calc Af Amer 47 (L) >60 mL/min    Comment: (NOTE) The eGFR has been calculated using the CKD EPI equation. This calculation has not been validated in all clinical situations. eGFR's persistently <60 mL/min signify possible Chronic Kidney Disease.    Anion gap 7 5 - 15    Comment: Performed at [facility], [address] [place], [place] [address]  Magnesium     Status: None   Collection Time: 01/12/18  4:26 AM  Result Value Ref Range   Magnesium 1.9 1.7 - 2.4 mg/dL    Comment: Performed at [facility], [address] [place], [place] [address]  Glucose, capillary     Status: Abnormal   Collection Time: 01/12/18  8:20 AM  Result Value Ref Range   Glucose-Capillary 257 (H) 65 - 99 mg/dL   Comment 1 Notify RN   Troponin I     Status: None   Collection Time: 01/12/18 12:05 PM  Result Value Ref Range   Troponin I <0.03 <0.03 ng/mL    Comment: Performed at [facility], [address] [place], [place] [address]  Blood gas, arterial     Status: Abnormal   Collection Time: 01/12/18 12:48 PM  Result Value Ref Range   FIO2 0.40    Delivery systems BILEVEL POSITIVE AIRWAY PRESSURE    LHR 8 resp/min   Inspiratory PAP 18    Expiratory PAP 6.0    pH, Arterial 7.35 7.350 - 7.450   pCO2 arterial 82 (HH) 32.0 - 48.0 mmHg    Comment: CRITICAL RESULT CALLED TO, READ BACK BY AND VERIFIED WITH: CRITICAL VALUE 01/12/18 1310 DR. CHEN/LS    pO2, Arterial 76 (L) 83.0 - 108.0 mmHg   Bicarbonate 45.3 (H) 20.0 - 28.0 mmol/L   Acid-Base Excess 16.4 (H) 0.0 - 2.0 mmol/L   O2 Saturation 94.4 %   Patient temperature 37.0    Collection site RIGHT RADIAL    Sample type ARTERIAL DRAW    Allens test (pass/fail) POSITIVE (A) PASS    Comment: Performed at [facility], [address] [place], [place] [address]  MRSA PCR Screening     Status: None   Collection Time: 01/12/18  2:49 PM  Result Value Ref Range   MRSA by PCR NEGATIVE NEGATIVE    Comment:        The GeneXpert MRSA Assay (FDA approved for NASAL specimens only), is one component of a comprehensive MRSA colonization surveillance program. It is not intended to diagnose MRSA infection nor to guide or monitor treatment for MRSA infections. Performed at [facility], [address] [place], [place]  [address]   Glucose, capillary     Status: Abnormal   Collection Time: 01/12/18  2:57 PM  Result Value Ref Range   Glucose-Capillary 185 (H) 65 - 99 mg/dL  Glucose, capillary     Status: Abnormal   Collection Time: 01/12/18  5:10 PM  Result Value Ref Range   Glucose-Capillary 160 (H) 65 - 99 mg/dL    Ct Head Wo Contrast  Result Date: 01/12/2018 CLINICAL DATA:  Syncope with fall.  Head trauma and ataxia. EXAM: CT HEAD WITHOUT CONTRAST TECHNIQUE: Contiguous axial images were obtained from the base of the skull through the vertex without intravenous contrast. COMPARISON:  None. FINDINGS: Brain: No evidence of acute infarction, hemorrhage, hydrocephalus, extra-axial collection or mass lesion/mass effect. Patchy low-density in the cerebral white matter, likely chronic small vessel ischemia given patient's vascular risk factors. Vascular: Atherosclerotic calcification, including a prominent plaque seen at the mid basilar. Skull: Negative for fracture. Areas of subcutaneous forehead fat reticulation which favor scar. Sinuses/Orbits: Proptosis without evidence of underlying cause. IMPRESSION: 1. No acute finding.  No evidence of intracranial injury. 2. Moderate for age white matter disease, likely chronic small vessel ischemia. Electronically Signed   By: Monte Fantasia M.D.   On: 01/12/2018 16:13   Dg Chest Port 1 View  Result Date: 01/12/2018 CLINICAL DATA:  Dyspnea EXAM:  PORTABLE CHEST 1 VIEW COMPARISON:  01/09/2018 FINDINGS: Cardiac shadow is mildly prominent. Increased vascular congestion is noted without significant interstitial edema. No focal infiltrate is seen. No bony abnormality is noted. IMPRESSION: Changes of mild CHF. Electronically Signed   By: Inez Catalina M.D.   On: 01/12/2018 14:49    Review of Systems  Constitutional: Positive for diaphoresis and malaise/fatigue.  HENT: Positive for congestion.   Eyes: Negative.   Respiratory: Positive for cough, shortness of breath and wheezing.    Cardiovascular: Positive for orthopnea, leg swelling and PND.  Gastrointestinal: Negative.   Genitourinary: Negative.   Musculoskeletal: Negative.   Skin: Negative.   Neurological: Positive for loss of consciousness and weakness.  Endo/Heme/Allergies: Negative.    Blood pressure (!) 159/101, pulse 89, temperature 98.2 F (36.8 C), temperature source Axillary, resp. rate 13, height 5' 3"  (1.6 m), weight (!) 314 lb 13.1 oz (142.8 kg), SpO2 99 %. Physical Exam  Assessment/Plan: Respiratory failure COPD exacerbation Edema Hypoxemia Congestive heart failure diastolic Morbid obesity Chest pain Hypertension Obstructive sleep apnea Chronic renal insufficiency GERD . PLAN Recommend aggressive pulmonary involvement including BiPAP Recommend supplemental oxygen therapy for respiratory failure Transferred to ICU for aggressive therapy Supplemental oxygen therapy for hypoxemia Supplemental inhalers for COPD Diuretic therapy for diastolic heart failure Maintain hypertension control Continue strict diabetes management with insulin sliding scale Recommend aggressive weight loss exercise portion control Consider echocardiogram for further assessment Continue omeprazole therapy for reflux symptoms  Dwayne D Callwood 01/12/2018, 6:27 PM

## 2018-01-12 NOTE — Progress Notes (Signed)
PT Cancellation Note  Patient Details Name: [name] MRN: [id number] DOB: 01/21/1966   Cancelled Treatment:    Reason Eval/Treat Not Completed: Medical issues which prohibited therapy.  Pt has been declining with respiratory function all day and now is moved to ICU.  Will need a new order to start PT as pt has dropped down functionally.     Ramond Dial 01/12/2018, 3:56 PM   Mee Hives, PT MS Acute Rehab Dept. Number: [place]

## 2018-01-12 NOTE — Progress Notes (Signed)
Patient able to transition off bipap to nasal cannula towards end of shift. Patient able to eat dinner without difficulty. Patient stood at bedside with moderate assistance from 2 staff to use urinal. Patient did complain of pain in legs after standing. PRN medication given.

## 2018-01-12 NOTE — Consult Note (Signed)
[place] [facility] Consultation    ASSESSMENT/PLAN   Acute on chronic hypercapnic respiratory failure. Placed on BiPAP, we'll moved to the intensive care unit for noninvasive ventilation. Not actively wheezing at this time, will provide rescue short-acting bronchodilators as needed. We'll monitor arterial blood gas closely along with mental status and oxygen saturations. Will obtain follow-up chest x-ray  History of heart failure with mild systolic dysfunction 32%GMW diastolic dysfunction. EKG on today does not reveal any acute abnormalities. Troponin was normal and BNP was within normal limits. Cardiology has been consulted, may need follow-up echocardiography  Leukocytosis. No clear evidence of infection at this time  Anemia. No evidence of active bleeding  Renal insufficiency. Initially BUN 23 and creatinine 1.79 has increased to 37 and 1.86. Patient has diuresed approximately a liter. Most likely a combination of decreased effective renal plasma flow, diuresis, medical renal disease and presumptive pulmonary hypertension with preload sensitivity along with left ventricular diastolic dysfunction. Will follow BUN/creatinine closely  Hyperglycemia. Place on coverage and sliding scale  Status post fall. Patient does relate that he hit his head at the bridge of his nose. Will obtain CT scan of his head noncontrast   Name: [name] MRN: [id number] DOB: 1966-07-01    ADMISSION DATE:  01/09/2018 CONSULTATION DATE:  01/11/2018  REFERRING MD :  Dr. Bridgett Larsson  CHIEF COMPLAINT:  Respiratory distress   HISTORY OF PRESENT ILLNESS:  [name] is a 52 year old African-American male with a past medical history remarkable for hypertension, diabetes, lipidemia, mild systolic dysfunction with EF of 44% and diastolic dysfunction, nonsustained ventricular tachycardia, OSA/OHS on nocturnal BiPAP presented on 1/24 with progressive lower extremity edema, 6 pound weight gain in one  week. He suffered a fall today and was found in his room with oxygen saturations in the 20%. He was started on noninvasive ventilation is being transferred to the intensive care unit. At the present time patient is responsive but somnolent. He arouses easily with question he states that during the fall he hit the bridge of his nose  PAST MEDICAL HISTORY :  Past Medical History:  Diagnosis Date  . Bell's palsy   . CHF (congestive heart failure) ([place])   . Diabetes mellitus without complication ([place])   . Hypertension   . NSVT (nonsustained ventricular tachycardia) ([place])    History reviewed. No pertinent surgical history. Prior to Admission medications   Medication Sig Start Date End Date Taking? Authorizing Provider  albuterol (PROVENTIL HFA;VENTOLIN HFA) 108 (90 Base) MCG/ACT inhaler Inhale 2 puffs into the lungs every 4 (four) hours as needed for wheezing or shortness of breath. 02/07/17  Yes Odem, Coolidge Breeze, FNP  allopurinol (ZYLOPRIM) 300 MG tablet Take 1 tablet (300 mg total) by mouth daily. 02/14/17  Yes Crissman, Jeannette How, MD  ammonium lactate (AMLACTIN) 12 % cream APPLY TOPICALLY AS NEEDED FOR DRY SKIN. 12/03/17  Yes Gardiner Barefoot, DPM  aspirin EC 81 MG tablet Take 1 tablet (81 mg total) by mouth daily. 02/07/17  Yes Odem, Coolidge Breeze, FNP  atorvastatin (LIPITOR) 40 MG tablet Take 1 tablet (40 mg total) by mouth at bedtime. 02/14/17  Yes Crissman, Jeannette How, MD  carvedilol (COREG) 25 MG tablet Take 1 tablet (25 mg total) by mouth every 12 (twelve) hours. 02/14/17  Yes Crissman, Jeannette How, MD  Cholecalciferol (D3-1000) 1000 units tablet Take 4,000 Units by mouth daily.   Yes [provider]  eplerenone (INSPRA) 50 MG tablet Take 50 mg by mouth daily.   Yes  [provider]  gabapentin (NEURONTIN) 300 MG capsule Take 2 capsules (600 mg total) by mouth 3 (three) times daily. 02/14/17  Yes Crissman, Jeannette How, MD  hydrALAZINE (APRESOLINE) 50 MG tablet Take 1 tablet (50 mg total) by mouth 3 (three)  times daily. 02/14/17  Yes Crissman, Jeannette How, MD  insulin aspart (NOVOLOG) 100 UNIT/ML injection Inject 20-35 Units into the skin 3 (three) times daily before meals. Patient taking differently: Inject 35 Units into the skin 3 (three) times daily before meals.  02/28/17  Yes Doles-Johnson, Teah, NP  insulin glargine (LANTUS) 100 UNIT/ML injection Inject 0.65-0.8 mLs (65-80 Units total) into the skin at bedtime. Patient taking differently: Inject 90 Units into the skin 2 (two) times daily.  02/28/17  Yes Doles-Johnson, Teah, NP  isosorbide dinitrate (ISORDIL) 20 MG tablet Take 1 tablet (20 mg total) by mouth 3 (three) times daily. 02/14/17  Yes Crissman, Jeannette How, MD  magnesium oxide (MAG-OX) 400 MG tablet Take 400 mg by mouth 2 (two) times daily.   Yes [provider]  torsemide (DEMADEX) 100 MG tablet Take 100 mg by mouth 2 (two) times daily.   Yes [provider]  triamcinolone cream (KENALOG) 0.1 % Apply 1 application topically 2 (two) times daily. 02/14/17  Yes Crissman, Jeannette How, MD  triamcinolone ointment (KENALOG) 0.5 % Apply 1 application topically 2 (two) times daily. 02/08/17  Yes Tawni Millers, MD  VICTOZA 18 MG/3ML SOPN Inject 0.6 mg into the skin daily. 12/25/17  Yes [provider]  furosemide (LASIX) 40 MG tablet Take 1-2 tabs morning and noon Patient not taking: Reported on 01/09/2018 02/14/17   Guadalupe Maple, MD  omeprazole (PRILOSEC) 20 MG capsule Take 1 capsule (20 mg total) by mouth 2 (two) times daily before a meal. Patient not taking: Reported on 01/09/2018 02/14/17   Guadalupe Maple, MD   No Known Allergies  FAMILY HISTORY:  Family History  Problem Relation Age of Onset  . Heart failure Mother   . Kidney failure Brother    SOCIAL HISTORY:  reports that  has never smoked. he has never used smokeless tobacco. He reports that he drinks alcohol. He reports that he does not use drugs.  REVIEW OF SYSTEMS:   Unable to obtain review of systems at this time  secondary to medical status  VITAL SIGNS: Temp:  [97.8 F (36.6 C)-99.4 F (37.4 C)] 97.8 F (36.6 C) (01/27 1133) Pulse Rate:  [78-92] 87 (01/27 1154) Resp:  [18-20] 20 (01/27 0524) BP: (112-171)/(57-95) 146/85 (01/27 1154) SpO2:  [87 %-99 %] 99 % (01/27 1133) Weight:  [311 lb 1.6 oz (141.1 kg)] 311 lb 1.6 oz (141.1 kg) (01/27 0524) HEMODYNAMICS:  INTAKE / OUTPUT:  Intake/Output Summary (Last 24 hours) at 01/12/2018 1338 Last data filed at 01/12/2018 1010 Gross per 24 hour  Intake 840 ml  Output 350 ml  Net 490 ml    Physical Examination:   VS: BP (!) 146/85 (BP Location: Right Arm)   Pulse 87   Temp 97.8 F (36.6 C) (Oral)   Resp 20   Wt (!) 311 lb 1.6 oz (141.1 kg)   SpO2 99%   BMI 55.11 kg/m   General Appearance: Patient is somnolent but arousable on noninvasive ventilation Neuro:without focal findings, somnolent but easily arousable, moves all extremities focal deficits noted. HEENT: PERRLA, EOM intact, no ptosis, no other lesions noticed; no obvious trauma at the bridge of his nose noted presently on noninvasive Pulmonary: Markedly diminished breath  sounds bilaterally, minimal expiratory rhonchi CardiovascularNormal S1,S2.  No m/r/g.    Abdomen: Massive obesity appreciated soft positive bowel sounds Skin:   warm, no rashes, no ecchymosis  Extremities: Significant lower extremity edema appreciated    LABS: Reviewed   LABORATORY PANEL:   CBC Recent Labs  Lab 01/10/18 0716  WBC 12.6*  HGB 11.2*  HCT 35.5*  PLT 265    Chemistries  Recent Labs  Lab 01/12/18 0426  NA 133*  K 4.5  CL 86*  CO2 40*  GLUCOSE 298*  BUN 37*  CREATININE 1.86*  CALCIUM 8.7*  MG 1.9    Recent Labs  Lab 01/11/18 0037 01/11/18 0819 01/11/18 1138 01/11/18 1642 01/11/18 2141 01/12/18 0820  GLUCAP 282* 232* 286* 280* 305* 257*   Recent Labs  Lab 01/11/18 0031 01/11/18 0501 01/12/18 1248  PHART 7.33* 7.35 7.35  PCO2ART 84* 78* 82*  PO2ART 283* 73* 76*   No  results for input(s): AST, ALT, ALKPHOS, BILITOT, ALBUMIN in the last 168 hours.  Cardiac Enzymes Recent Labs  Lab 01/12/18 1205  TROPONINI <0.03    RADIOLOGY:  No results found.  Hermelinda Dellen, DO 01/12/2018, 1:38 PM

## 2018-01-12 NOTE — Progress Notes (Signed)
Patient kept repeating that he cannot pee and could not follow directions with RN to just pee (external catheter in place). Pt rolling around bed and yelling. Patient not oriented and with recent fall and bipap not stable to get out bed with RN. RN called MD who gave order for one time in and out catherization. In and out cath performed and 1200 mL of urine voided by patient.

## 2018-01-13 DIAGNOSIS — E873 Alkalosis: Secondary | ICD-10-CM

## 2018-01-13 DIAGNOSIS — E662 Morbid (severe) obesity with alveolar hypoventilation: Secondary | ICD-10-CM

## 2018-01-13 DIAGNOSIS — J9621 Acute and chronic respiratory failure with hypoxia: Secondary | ICD-10-CM

## 2018-01-13 LAB — BASIC METABOLIC PANEL
Anion gap: 7 (ref 5–15)
BUN: 33 mg/dL — ABNORMAL HIGH (ref 6–20)
CO2: 40 mmol/L — ABNORMAL HIGH (ref 22–32)
Calcium: 8.8 mg/dL — ABNORMAL LOW (ref 8.9–10.3)
Chloride: 89 mmol/L — ABNORMAL LOW (ref 101–111)
Creatinine, Ser: 1.63 mg/dL — ABNORMAL HIGH (ref 0.61–1.24)
GFR calc Af Amer: 56 mL/min — ABNORMAL LOW (ref >60)
GFR calc non Af Amer: 48 mL/min — ABNORMAL LOW (ref >60)
Glucose, Bld: 187 mg/dL — ABNORMAL HIGH (ref 65–99)
Potassium: 3.8 mmol/L (ref 3.5–5.1)
Sodium: 136 mmol/L (ref 135–145)

## 2018-01-13 LAB — CBC
HCT: 31.6 % — ABNORMAL LOW (ref 40.0–52.0)
Hemoglobin: 10.2 g/dL — ABNORMAL LOW (ref 13.0–18.0)
MCH: 26.9 pg (ref 26.0–34.0)
MCHC: 32.2 g/dL (ref 32.0–36.0)
MCV: 83.3 fL (ref 80.0–100.0)
Platelets: 244 10*3/uL (ref 150–440)
RBC: 3.8 MIL/uL — ABNORMAL LOW (ref 4.40–5.90)
RDW: 15.9 % — ABNORMAL HIGH (ref 11.5–14.5)
WBC: 10.2 10*3/uL (ref 3.8–10.6)

## 2018-01-13 LAB — BLOOD GAS, ARTERIAL
Acid-Base Excess: 14.3 mmol/L — ABNORMAL HIGH (ref 0.0–2.0)
Acid-Base Excess: 19.7 mmol/L — ABNORMAL HIGH (ref 0.0–2.0)
Bicarbonate: 43.1 mmol/L — ABNORMAL HIGH (ref 20.0–28.0)
Bicarbonate: 47.1 mmol/L — ABNORMAL HIGH (ref 20.0–28.0)
Delivery systems: POSITIVE
FIO2: 40
FIO2: 0.36
Mechanical Rate: 20
O2 Saturation: 93.7 %
O2 Saturation: 92 %
Patient temperature: 37
Patient temperature: 37
pCO2 arterial: 78 mmHg (ref 32.0–48.0)
pCO2 arterial: 71 mmHg (ref 32.0–48.0)
pH, Arterial: 7.35 (ref 7.350–7.450)
pH, Arterial: 7.43 (ref 7.350–7.450)
pO2, Arterial: 73 mmHg — ABNORMAL LOW (ref 83.0–108.0)
pO2, Arterial: 62 mmHg — ABNORMAL LOW (ref 83.0–108.0)

## 2018-01-13 LAB — GLUCOSE, CAPILLARY
Glucose-Capillary: 238 mg/dL — ABNORMAL HIGH (ref 65–99)
Glucose-Capillary: 183 mg/dL — ABNORMAL HIGH (ref 65–99)
Glucose-Capillary: 234 mg/dL — ABNORMAL HIGH (ref 65–99)
Glucose-Capillary: 254 mg/dL — ABNORMAL HIGH (ref 65–99)
Glucose-Capillary: 201 mg/dL — ABNORMAL HIGH (ref 65–99)

## 2018-01-13 MED ORDER — INSULIN ASPART 100 UNIT/ML ~~LOC~~ SOLN
15.0000 [IU] | Freq: Three times a day (TID) | SUBCUTANEOUS | Status: DC
  Administered 2018-01-13 – 2018-01-14 (×3): 15 [IU] via SUBCUTANEOUS
  Filled 2018-01-13 (×3): qty 1

## 2018-01-13 MED ORDER — FUROSEMIDE 10 MG/ML IJ SOLN: 60.0000 mg | Freq: Two times a day (BID) | INTRAMUSCULAR | Status: DC

## 2018-01-13 MED ORDER — SENNOSIDES-DOCUSATE SODIUM 8.6-50 MG PO TABS
1.0000 | ORAL_TABLET | Freq: Two times a day (BID) | ORAL | Status: DC
  Administered 2018-01-13 – 2018-01-14 (×2): 1 via ORAL
  Filled 2018-01-13 (×2): qty 1

## 2018-01-13 MED ORDER — ACETAZOLAMIDE SODIUM 500 MG IJ SOLR
500.0000 mg | Freq: Once | INTRAMUSCULAR | Status: AC
  Administered 2018-01-13: 500 mg via INTRAVENOUS
  Filled 2018-01-13: qty 500

## 2018-01-13 MED ORDER — FUROSEMIDE 10 MG/ML IJ SOLN
60.0000 mg | Freq: Two times a day (BID) | INTRAMUSCULAR | Status: DC
Start: 2018-01-14 — End: 2018-01-14
  Administered 2018-01-14: 60 mg via INTRAVENOUS
  Filled 2018-01-13: qty 6

## 2018-01-13 MED ORDER — POTASSIUM CHLORIDE CRYS ER 20 MEQ PO TBCR
40.0000 meq | EXTENDED_RELEASE_TABLET | Freq: Two times a day (BID) | ORAL | Status: AC
  Administered 2018-01-13 (×2): 40 meq via ORAL
  Filled 2018-01-13 (×2): qty 2

## 2018-01-13 MED ORDER — INSULIN GLARGINE 100 UNIT/ML ~~LOC~~ SOLN
40.0000 [IU] | Freq: Two times a day (BID) | SUBCUTANEOUS | Status: DC
  Administered 2018-01-13 – 2018-01-14 (×2): 40 [IU] via SUBCUTANEOUS
  Filled 2018-01-13 (×3): qty 0.4

## 2018-01-13 NOTE — Progress Notes (Signed)
No distress on nasal cannula oxygen Cognition intact No new complaints  Vitals:   01/13/18 0851 01/13/18 0900 01/13/18 1000 01/13/18 1208  BP:  115/62 127/82 115/65  Pulse: 87 84 78 77  Resp: 11 15 17    Temp:    98.7 F (37.1 C)  TempSrc:    Oral  SpO2: 93% (!) 89% 92% (!) 84%  Weight:      Height:        NAD HEENT WNL JVP not visualized No wheezes or adventitious sounds Distant HS, no M, regular Obese, soft, NABS Symmetric LE edema with chronic stasis changes No focal neurologic deficits  BMP Latest Ref Rng & Units 01/13/2018 01/12/2018 01/11/2018  Glucose 65 - 99 mg/dL 187(H) 298(H) 247(H)  BUN 6 - 20 mg/dL 33(H) 37(H) 33(H)  Creatinine 0.61 - 1.24 mg/dL 1.63(H) 1.86(H) 2.02(H)  BUN/Creat Ratio 9 - 20 - - -  Sodium 135 - 145 mmol/L 136 133(L) 136  Potassium 3.5 - 5.1 mmol/L 3.8 4.5 4.5  Chloride 101 - 111 mmol/L 89(L) 86(L) 89(L)  CO2 22 - 32 mmol/L 40(H) 40(H) 40(H)  Calcium 8.9 - 10.3 mg/dL 8.8(L) 8.7(L) 8.8(L)    CBC Latest Ref Rng & Units 01/13/2018 01/10/2018 01/09/2018  WBC 3.8 - 10.6 K/uL 10.2 12.6(H) 11.6(H)  Hemoglobin 13.0 - 18.0 g/dL 10.2(L) 11.2(L) 11.0(L)  Hematocrit 40.0 - 52.0 % 31.6(L) 35.5(L) 34.5(L)  Platelets 150 - 440 K/uL 244 265 270     ABG: 7.43/71/62   IMPRESSION: Acute on chronic hypercapnic respiratory failure  Decompensated OHS/OSA History of heart failure with mild systolic dysfunction and diastolic dysfunction Cor pulmonale Primary metabolic alkalosis due to loop diuresis  Essentially, this "allows" [name] to hyperventilate further AKI, resolving CKD -baseline creatinine approximately 1.65 DM 2 with hyperglycemia  PLAN/REC: Transfer to MedSurg floor with cardiac monitoring Continue supplemental oxygen, carefully titrated to maintain SPO2 88-93% Continue nocturnal BiPAP Hold further loop diuretics Acetazolamide x1 dose today Aggressive KCl repletion (to help correct metabolic alkalosis) Avoid all opiates (concern for respiratory  disease impression)  Discharge planning: He should be discharged home on supplemental oxygen, BiPAP and encouraged vigorously to comply with these therapies.  I again cautioned against overly aggressive diuresis with loop diuretics which will exacerbate metabolic alkalosis and secondarily respiratory acidosis.  He is followed by [facility] pulmonology and arrangements should be made for [name] to be seen by them after discharge.  After transfer, PCCM will sign off. Please call if we can be of further assistance   Discussed with Dr. Alvy Bimler, MD PCCM service Mobile [phone] Pager [phone] 01/13/2018 1:27 PM

## 2018-01-13 NOTE — Progress Notes (Addendum)
Inpatient Diabetes Program Recommendations  AACE/ADA: New Consensus Statement on Inpatient Glycemic Control (2015)  Target Ranges:  Prepandial:   less than 140 mg/dL      Peak postprandial:   less than 180 mg/dL (1-2 hours)      Critically ill patients:  140 - 180 mg/dL   Results for VICKY, MCCANLESS (MRN [id number]) as of 01/13/2018 11:59  Ref. Range 01/12/2018 08:20 01/12/2018 11:57 01/12/2018 14:57 01/12/2018 17:10 01/12/2018 21:44  Glucose-Capillary Latest Ref Range: 65 - 99 mg/dL 257 (H) 238 (H) 185 (H) 160 (H) 165 (H)   Results for AROLDO, GALLI (MRN [id number]) as of 01/13/2018 11:59  Ref. Range 01/13/2018 07:50 01/13/2018 11:05  Glucose-Capillary Latest Ref Range: 65 - 99 mg/dL 183 (H) 234 (H)     Home DM Meds: Lantus 90 units BID       Novolog 35 units TID       Victoza 0.6 mg daily  Current Insulin Orders: Lantus 35 units BID      Novolog Moderate Correction Scale/ SSI (0-15 units) TID AC + HS      Novolog 10 units TID      MD- Please consider the following in-hospital insulin adjustments:  1. Increase Lantus to 40 units BID  2. Increase Novolog Meal Coverage to: Novolog 15 units TID (hold if pt eats <50% of meal)      --Will follow patient during hospitalization--  Wyn Quaker RN, MSN, CDE Diabetes Coordinator Inpatient Glycemic Control Team Team Pager: [phone] (8a-5p)

## 2018-01-13 NOTE — Progress Notes (Signed)
Report given to [facility] RN for patient to be transferred to room 128 with personal belongings.

## 2018-01-13 NOTE — Progress Notes (Addendum)
[place] at [place] NAME: [name]    MR#:  [id number]  DATE OF BIRTH:  Feb 19, 1966  SUBJECTIVE:  CHIEF COMPLAINT:   Chief Complaint  Patient presents with  . Chest Pain  . Leg Swelling   The patient is off BiPAP and transferred back to the floor. On O2 [place] 2L. REVIEW OF SYSTEMS:  CONSTITUTIONAL: No fever, has generalized weakness.  EYES: No blurred or double vision.  EARS, NOSE, AND THROAT: No tinnitus or ear pain.  RESPIRATORY: No cough, has shortness of breath, no wheezing or hemoptysis.  CARDIOVASCULAR: No chest pain, orthopnea, has leg edema.  GASTROINTESTINAL: No nausea, vomiting, diarrhea or abdominal pain.  GENITOURINARY: No dysuria, hematuria.  ENDOCRINE: No polyuria, nocturia,  HEMATOLOGY: No anemia, easy bruising or bleeding SKIN: No rash or lesion. MUSCULOSKELETAL: No joint pain or arthritis.   NEUROLOGIC: No tingling, numbness, weakness.  PSYCHIATRY: No anxiety or depression.   ROS  DRUG ALLERGIES:  No Known Allergies  VITALS:  Blood pressure 134/67, pulse 81, temperature 98.9 F (37.2 C), temperature source Oral, resp. rate 17, height 5\' 3"  (1.6 m), weight (!) 305 lb 16 oz (138.8 kg), SpO2 95 %.  PHYSICAL EXAMINATION:   GENERAL:  52 y.o.-year-old patient lying in the bed with no acute distress.  Morbid obesity. EYES: Pupils equal, round, reactive to light and accommodation. No scleral icterus. Extraocular muscles intact.  HEENT: Head atraumatic, normocephalic. Oropharynx and nasopharynx clear.  NECK:  Supple, no jugular venous distention. No thyroid enlargement, no tenderness.  LUNGS:coarse breath sounds bilaterally at bases cARDIOVASCULAR: S1, S2 normal. No murmurs, rubs, or gallops.  ABDOMEN: Soft, nontender, nondistended. Bowel sounds present. No organomegaly or mass.  EXTREMITIES: 2+ pitting edema, patient has chronic eczematous skin with weeping.   NEUROLOGIC: Cranial nerves II through XII are intact.  Muscle strength 4/5 in all extremities. Sensation intact. Gait not checked.  PSYCHIATRIC: The patient is alert and oriented x 3.  SKIN: No obvious rash, lesion, or ulcer.    Physical Exam LABORATORY PANEL:   CBC Recent Labs  Lab 01/13/18 0530  WBC 10.2  HGB 10.2*  HCT 31.6*  PLT 244   ------------------------------------------------------------------------------------------------------------------  Chemistries  Recent Labs  Lab 01/12/18 0426 01/13/18 0530  NA 133* 136  K 4.5 3.8  CL 86* 89*  CO2 40* 40*  GLUCOSE 298* 187*  BUN 37* 33*  CREATININE 1.86* 1.63*  CALCIUM 8.7* 8.8*  MG 1.9  --    ------------------------------------------------------------------------------------------------------------------  Cardiac Enzymes Recent Labs  Lab 01/12/18 1205 01/12/18 1845  TROPONINI <0.03 <0.03   ------------------------------------------------------------------------------------------------------------------  RADIOLOGY:  Ct Head Wo Contrast  Result Date: 01/12/2018 CLINICAL DATA:  Syncope with fall.  Head trauma and ataxia. EXAM: CT HEAD WITHOUT CONTRAST TECHNIQUE: Contiguous axial images were obtained from the base of the skull through the vertex without intravenous contrast. COMPARISON:  None. FINDINGS: Brain: No evidence of acute infarction, hemorrhage, hydrocephalus, extra-axial collection or mass lesion/mass effect. Patchy low-density in the cerebral white matter, likely chronic small vessel ischemia given patient's vascular risk factors. Vascular: Atherosclerotic calcification, including a prominent plaque seen at the mid basilar. Skull: Negative for fracture. Areas of subcutaneous forehead fat reticulation which favor scar. Sinuses/Orbits: Proptosis without evidence of underlying cause. IMPRESSION: 1. No acute finding.  No evidence of intracranial injury. 2. Moderate for age white matter disease, likely chronic small vessel ischemia. Electronically Signed   By:  Monte Fantasia M.D.   On: 01/12/2018 16:13   Dg  Chest Port 1 View  Result Date: 01/12/2018 CLINICAL DATA:  Dyspnea EXAM: PORTABLE CHEST 1 VIEW COMPARISON:  01/09/2018 FINDINGS: Cardiac shadow is mildly prominent. Increased vascular congestion is noted without significant interstitial edema. No focal infiltrate is seen. No bony abnormality is noted. IMPRESSION: Changes of mild CHF. Electronically Signed   By: Inez Catalina M.D.   On: 01/12/2018 14:49    ASSESSMENT AND PLAN:   Active Problems:   Acute on chronic diastolic heart failure (HCC)  #1  Acute on chronic respiratory failure with hypoxia and hypercapnia due to acute on chronic diastolic heart failure and hypoventilation syndrome. He usually follows up at [facility] heart failure clinic, patient complains of weight gain of 6 pounds in last 1 week, according to [facility] note patient supposed to continue torsemide 100 mg twice daily, eplerenone 50 mg daily and also advised to take metozalone and as instructed by heart failure clinic but he says he does not know any of that.   He was on Lasix 60 mg IV every 6 hours, decreased to every 8 hours. daily weights, continue low-sodium diet, echocardi[facility] in [facility] in April 2018 showed EF 40-45%. Per Dr. Alva Garnet, Continue supplemental oxygen, carefully titrated to maintain SPO2 88-93% Continue nocturnal BiPAP Hold further loop diuretics Acetazolamide x1 dose today Aggressive KCl repletion (to help correct metabolic alkalosis) Avoid all opiates (concern for respiratory disease impression)  Per Dr. Clayborn Bigness, changed to IV Lasix 60 mg twice daily.  Resume home torsemide 100 mg twice daily after discharge.   2. Acute on chronic diastolic heart failure, patient on beta-blockers, hydralazine.  Continue lasix iv,  Coreg 25 mg twice daily, hydralazine 100 mg 3 times daily, Isordil 40 mg 3 times daily, eplerenone 50 mg daily as per the note.  3.  Essential hypertension: Controlled 4.  History of CAD with bare-metal  stent in [facility] in 2016.  Patient on aspirin, statins, beta-blockers. 5.  Morbid obesity, hypoventilation syndrome, patient on BiPAP at night, 6.  Diabetes mellitus type 2 with diabetic neuropathy, long-term use of insulin.  Continue insulin with sliding scale coverage,  Lantus 35 units twice daily.  Added NovoLog 10 unit AC. 7. diffuse plaque psoriasis followed by [facility], patient received E with therapy, topical prednisone.  8. Itching   Benadryl oral. 9. CKD stage 3- stable, monitor with lasix.  Chest pain.  Troponin level is less than 0.03.  Continue aspirin and statin.  Nitroglycerin as needed.  Improved.  Fall.  CAT scan of the head was unremarkable.  Fall precaution.  Generalized weakness.  PT evaluation.  I discussed with intensivist Dr. Shawna Orleans and Dr. Clayborn Bigness. All the records are reviewed and case discussed with Care Management/Social Workerr. Management plans discussed with the patient, family and they are in agreement.  CODE STATUS: Full.  TOTAL CRITICAL TIME TAKING CARE OF THIS PATIENT: 48 minutes.    POSSIBLE D/C IN 2 DAYS, DEPENDING ON CLINICAL CONDITION.   Demetrios Loll M.D on 01/13/2018   Between 7am to 6pm - Pager - [phone]  After 6pm go to [email or web address] - password [email or web address] [place] [facility]  Office  [phone]  CC: Primary care physician; System, Pcp Not In  Note: This dictation was prepared with Dragon dictation along with smaller phrase technology. Any transcriptional errors that result from this process are unintentional.

## 2018-01-13 NOTE — Care Management (Addendum)
RNCM consult received for assistance with Medicaid. Patient listed as having BCBS now however he has a history referral to Medication Management/[facility]. Patient should have received notification from DSS on how to reapply if he qualified. Patient can go to DSS again if he has lost that information. There is nothing that RNCM can do to assist with Medicaid application.  RNCM will follow for medication assistance and follow up care.

## 2018-01-14 ENCOUNTER — Inpatient Hospital Stay: Payer: BLUE CROSS/BLUE SHIELD

## 2018-01-14 LAB — BASIC METABOLIC PANEL
Anion gap: 7 (ref 5–15)
BUN: 28 mg/dL — ABNORMAL HIGH (ref 6–20)
CO2: 33 mmol/L — ABNORMAL HIGH (ref 22–32)
Calcium: 9.2 mg/dL (ref 8.9–10.3)
Chloride: 96 mmol/L — ABNORMAL LOW (ref 101–111)
Creatinine, Ser: 1.5 mg/dL — ABNORMAL HIGH (ref 0.61–1.24)
GFR calc Af Amer: >60 mL/min (ref >60)
GFR calc non Af Amer: 53 mL/min — ABNORMAL LOW (ref >60)
Glucose, Bld: 197 mg/dL — ABNORMAL HIGH (ref 65–99)
Potassium: 4 mmol/L (ref 3.5–5.1)
Sodium: 136 mmol/L (ref 135–145)

## 2018-01-14 LAB — GLUCOSE, CAPILLARY
Glucose-Capillary: 187 mg/dL — ABNORMAL HIGH (ref 65–99)
Glucose-Capillary: 210 mg/dL — ABNORMAL HIGH (ref 65–99)

## 2018-01-14 MED ORDER — DOCUSATE SODIUM 100 MG PO CAPS: 100.0000 mg | ORAL_CAPSULE | Freq: Two times a day (BID) | ORAL | Status: DC

## 2018-01-14 NOTE — Care Management (Signed)
Discharge to home today per Dr. Bridgett Larsson. Will be followed by [facility] in the home for COPD/CHF protocol. Will have Nurse and physical therapy in the home. Shelbie Ammons RN MSN CCM Care Management [phone]

## 2018-01-14 NOTE — Discharge Instructions (Signed)
Heart healthy and ADA diet. Compliance to medication and CPAP at night. [facility] with CHF protocol Continue home O2 [place]. Follows up with [facility] heart failure clinic. Follow up [facility] cardiologist in 1 week.

## 2018-01-14 NOTE — Progress Notes (Signed)
Pt refused to wear BiPap. Educated patient on benefits of BiPap.

## 2018-01-14 NOTE — Discharge Summary (Addendum)
[place] at [place] NAME: [name]    MR#:  [id number]  DATE OF BIRTH:  [birth year]  DATE OF ADMISSION:  01/09/2018   ADMITTING PHYSICIAN: Epifanio Lesches, MD  DATE OF DISCHARGE: 01/14/2018 PRIMARY CARE PHYSICIAN: System, Pcp Not In   ADMISSION DIAGNOSIS:  Anasarca [R60.1] DISCHARGE DIAGNOSIS:  Active Problems:   Acute on chronic diastolic heart failure ([place])  SECONDARY DIAGNOSIS:   Past Medical History:  Diagnosis Date  . Bell's palsy   . CHF (congestive heart failure) ([place])   . Diabetes mellitus without complication ([place])   . Hypertension   . NSVT (nonsustained ventricular tachycardia) [facility])    HOSPITAL COURSE:  #1  Acute on chronic respiratory failure with hypoxia and hypercapnia due to acute on chronic diastolic heart failure and hypoventilation syndrome. He usually follows up at [facility] heart failure clinic, patient complains of weight gain of 6 pounds in last 1 week, according to [facility] note patient supposed to continue torsemide 100 mg twice daily, eplerenone 50 mg daily and also advised to take metozaloneand as instructed by heart failure clinic but he says he does not know any of that.  He was on Lasix 60 mg IV every 6 hours, decreased to every 8 hours. daily weights, continue low-sodium diet, echocardiogram in [facility] in April 2018 showed EF 40-45%. Per Dr. Alva Garnet, Continue supplemental oxygen, carefully titrated to maintain SPO2 88-93% Continue nocturnal BiPAP Hold further loop diuretic and Acetazolamide x1 dose in ICU. Aggressive KCl repletion (to help correct metabolic alkalosis) Avoid all opiates (concern for respiratory disease impression)  Per Dr. Clayborn Bigness, changed to IV Lasix 60 mg twice daily.  Resume home torsemide 100 mg twice daily after discharge.  2. Acute on chronic diastolic heart failure, patient on beta-blockers, hydralazine. Continue lasix iv,  Coreg 25 mg twice daily, hydralazine 100 mg 3 times  daily, Isordil 40 mg 3 times daily, eplerenone 50 mg daily as per the note.  3. Essential hypertension: Controlled 4. History of CAD with bare-metal stent in [facility] in 2016. Patient on aspirin, statins, beta-blockers. 5. Morbid obesity, hypoventilation syndrome, patient on BiPAP at night, 6. Diabetes mellitus type 2 with diabetic neuropathy, long-term use of insulin. Continue insulin with sliding scale coverage,  Lantus 35 units twice daily.  Added NovoLog 10 unit AC. 7.diffuse plaque psoriasis followed by [facility], patient received E with therapy, topical prednisone.  8. Itching   Benadryl oral. 9. CKD stage 3- stable, monitor with lasix.  Chest pain.  Troponin level is less than 0.03.  Continue aspirin and statin.  Nitroglycerin as needed.  Improved.  Fall.  CAT scan of the head was unremarkable.  Fall precaution.  Generalized weakness.  PT evaluation: HHPT and SW. DISCHARGE CONDITIONS:  Stable, discharge home with  HHPT today. CONSULTS OBTAINED:  Treatment Team:  Yolonda Kida, MD DRUG ALLERGIES:  No Known Allergies DISCHARGE MEDICATIONS:   Allergies as of 01/14/2018   No Known Allergies     Medication List    STOP taking these medications   furosemide 40 MG tablet Commonly known as:  LASIX     TAKE these medications   albuterol 108 (90 Base) MCG/ACT inhaler Commonly known as:  PROVENTIL HFA;VENTOLIN HFA Inhale 2 puffs into the lungs every 4 (four) hours as needed for wheezing or shortness of breath.   allopurinol 300 MG tablet Commonly known as:  ZYLOPRIM Take 1 tablet (300 mg total) by mouth daily.   ammonium lactate 12 %  cream Commonly known as:  AMLACTIN APPLY TOPICALLY AS NEEDED FOR DRY SKIN.   aspirin EC 81 MG tablet Take 1 tablet (81 mg total) by mouth daily.   atorvastatin 40 MG tablet Commonly known as:  LIPITOR Take 1 tablet (40 mg total) by mouth at bedtime.   carvedilol 25 MG tablet Commonly known as:  COREG Take 1 tablet (25 mg total)  by mouth every 12 (twelve) hours.   D3-1000 1000 units tablet Generic drug:  Cholecalciferol Take 4,000 Units by mouth daily.   eplerenone 50 MG tablet Commonly known as:  INSPRA Take 50 mg by mouth daily.   gabapentin 300 MG capsule Commonly known as:  NEURONTIN Take 2 capsules (600 mg total) by mouth 3 (three) times daily.   hydrALAZINE 50 MG tablet Commonly known as:  APRESOLINE Take 1 tablet (50 mg total) by mouth 3 (three) times daily.   insulin aspart 100 UNIT/ML injection Commonly known as:  NOVOLOG Inject 20-35 Units into the skin 3 (three) times daily before meals. What changed:  how much to take   insulin glargine 100 UNIT/ML injection Commonly known as:  LANTUS Inject 0.65-0.8 mLs (65-80 Units total) into the skin at bedtime. What changed:    how much to take  when to take this   isosorbide dinitrate 20 MG tablet Commonly known as:  ISORDIL Take 1 tablet (20 mg total) by mouth 3 (three) times daily.   magnesium oxide 400 MG tablet Commonly known as:  MAG-OX Take 400 mg by mouth 2 (two) times daily.   omeprazole 20 MG capsule Commonly known as:  PRILOSEC Take 1 capsule (20 mg total) by mouth 2 (two) times daily before a meal.   torsemide 100 MG tablet Commonly known as:  DEMADEX Take 100 mg by mouth 2 (two) times daily.   triamcinolone ointment 0.5 % Commonly known as:  KENALOG Apply 1 application topically 2 (two) times daily.   triamcinolone cream 0.1 % Commonly known as:  KENALOG Apply 1 application topically 2 (two) times daily.   VICTOZA 18 MG/3ML Sopn Generic drug:  liraglutide Inject 0.6 mg into the skin daily.        DISCHARGE INSTRUCTIONS:  See AVS. If you experience worsening of your admission symptoms, develop shortness of breath, life threatening emergency, suicidal or homicidal thoughts you must seek medical attention immediately by calling 911 or calling your MD immediately  if symptoms less severe.  You Must read complete  instructions/literature along with all the possible adverse reactions/side effects for all the Medicines you take and that have been prescribed to you. Take any new Medicines after you have completely understood and accpet all the possible adverse reactions/side effects.   Please note  You were cared for by a hospitalist during your hospital stay. If you have any questions about your discharge medications or the care you received while you were in the hospital after you are discharged, you can call the unit and asked to speak with the hospitalist on call if the hospitalist that took care of you is not available. Once you are discharged, your primary care physician will handle any further medical issues. Please note that NO REFILLS for any discharge medications will be authorized once you are discharged, as it is imperative that you return to your primary care physician (or establish a relationship with a primary care physician if you do not have one) for your aftercare needs so that they can reassess your need for medications and monitor your  lab values.    On the day of Discharge:  VITAL SIGNS:  Blood pressure (!) 141/77, pulse 86, temperature 98.2 F (36.8 C), temperature source Oral, resp. rate 20, height 5\' 3"  (1.6 m), weight 299 lb 14.4 oz (136 kg), SpO2 92 %. PHYSICAL EXAMINATION:  GENERAL:  52 y.o.-year-old patient lying in the bed with no acute distress.  Morbid obesity. EYES: Pupils equal, round, reactive to light and accommodation. No scleral icterus. Extraocular muscles intact.  HEENT: Head atraumatic, normocephalic. Oropharynx and nasopharynx clear.  NECK:  Supple, no jugular venous distention. No thyroid enlargement, no tenderness.  LUNGS: Normal breath sounds bilaterally, no wheezing, rales,rhonchi or crepitation. No use of accessory muscles of respiration.  CARDIOVASCULAR: S1, S2 normal. No murmurs, rubs, or gallops.  ABDOMEN: Soft, non-tender, non-distended. Bowel sounds present.  No organomegaly or mass.  EXTREMITIES: No cyanosis, or clubbing.  Much better bilateral lower extremity edema.chronic eczematous skin. NEUROLOGIC: Cranial nerves II through XII are intact. Muscle strength 4/5 in all extremities. Sensation intact. Gait not checked.  PSYCHIATRIC: The patient is alert and oriented x 3.  SKIN: No obvious rash, lesion, or ulcer. chronic eczematous skin  DATA REVIEW:   CBC Recent Labs  Lab 01/13/18 0530  WBC 10.2  HGB 10.2*  HCT 31.6*  PLT 244    Chemistries  Recent Labs  Lab 01/12/18 0426  01/14/18 0634  NA 133*   < > 136  K 4.5   < > 4.0  CL 86*   < > 96*  CO2 40*   < > 33*  GLUCOSE 298*   < > 197*  BUN 37*   < > 28*  CREATININE 1.86*   < > 1.50*  CALCIUM 8.7*   < > 9.2  MG 1.9  --   --    < > = values in this interval not displayed.     Microbiology Results  Results for orders placed or performed during the hospital encounter of 01/09/18  MRSA PCR Screening     Status: None   Collection Time: 01/12/18  2:49 PM  Result Value Ref Range Status   MRSA by PCR NEGATIVE NEGATIVE Final    Comment:        The GeneXpert MRSA Assay (FDA approved for NASAL specimens only), is one component of a comprehensive MRSA colonization surveillance program. It is not intended to diagnose MRSA infection nor to guide or monitor treatment for MRSA infections. Performed at [facility], [address] [place], [place] [address]     RADIOLOGY:  Dg Chest Port 1 View  Result Date: 01/14/2018 CLINICAL DATA:  52 year old male with respiratory failure, acute on chronic diastolic heart failure. EXAM: PORTABLE CHEST 1 VIEW COMPARISON:  01/12/2018 and earlier. FINDINGS: Portable AP upright view at 0458 hours. Stable cardiomegaly and mediastinal contours. Stable lung volumes. Increased bilateral streaky and interstitial perihilar opacity plus fluid or thickening in the right minor fissure since 01/09/2018. No pneumothorax. No pleural effusion is  evident. Ventilation is stable from 01/12/2018. IMPRESSION: 1. Stable ventilation since 01/12/2018 with bilateral perihilar streaky and interstitial opacity favored due to pulmonary edema. Trace fluid in the right minor fissure, no other pleural effusion is evident. 2. Stable cardiomegaly. Electronically Signed   By: Genevie Ann M.D.   On: 01/14/2018 06:48     Management plans discussed with the patient, family and they are in agreement.  CODE STATUS: Full Code   TOTAL TIME TAKING CARE OF THIS PATIENT: 36 minutes.  Demetrios Loll M.D on 01/14/2018 at 2:47 PM  Between 7am to 6pm - Pager - [phone]  After 6pm go to [email or web address] - Proofreader  [employer] [place] [facility]  Office  [phone]  CC: Primary care physician; System, Pcp Not In   Note: This dictation was prepared with Dragon dictation along with smaller phrase technology. Any transcriptional errors that result from this process are unintentional.

## 2018-01-14 NOTE — Progress Notes (Signed)
[facility]  Next of kin:  Olusegun Gerstenberger - [phone]  PCP: Marygrace Drought  Cardiologist: Dr. Wilhelmina Mcardle @ [place]: Dr. Sima Matas @ [place] 01/14/2018, 10:21 AM

## 2018-01-14 NOTE — Evaluation (Signed)
Physical Therapy Evaluation Patient Details Name: [name] MRN: [id number] DOB: [birth year] Today's Date: 01/14/2018   History of Present Illness  52 y/o male here with LE swelling/fluid issues, admitted with CHF.   Clinical Impression  Pt is somewhat hesitant to do a lot with PT but was able to ambulate ~150 ft with walker and 3 liters O2.  He was generally quick to fatigue with the effort and reports he could not have done much more, but ultimately showed good enough safety/mobility to be able to return home.  He is not at his baseline and would benefit from PT at home to work on the below deficits.      Follow Up Recommendations Home health PT    Equipment Recommendations       Recommendations for Other Services       Precautions / Restrictions Precautions Precautions: Fall Restrictions Weight Bearing Restrictions: No      Mobility  Bed Mobility               General bed mobility comments: in recliner on arrival not tested  Transfers Overall transfer level: Independent Equipment used: None             General transfer comment: Pt was able to maintain balance w/o use of AD, independent with sit to stand  Ambulation/Gait Ambulation/Gait assistance: Supervision Ambulation Distance (Feet): 150 Feet Assistive device: Rolling walker (2 wheeled)       General Gait Details: Pt walked with good confidence, consistent speed/cadence but did have some fatigue with the effort.  On 3 liters, sats dropped to mid/high 80s but despite feeling tired he was no short of breath  Stairs            Wheelchair Mobility    Modified Rankin (Stroke Patients Only)       Balance Overall balance assessment: Modified Independent                                           Pertinent Vitals/Pain Pain Assessment: (LE pain from swelling)    Home Living Family/patient expects to be discharged to:: Private residence Living Arrangements:  Alone Available Help at Discharge: Family   Home Access: Level entry       Home Equipment: Environmental consultant - 4 wheels;Walker - 2 wheels      Prior Function Level of Independence: Independent with assistive device(s)         Comments: reports he does not use walker in the home, but that until a week ago he was going to the store, etc with his O2     Hand Dominance        Extremity/Trunk Assessment   Upper Extremity Assessment Upper Extremity Assessment: Overall WFL for tasks assessed;Generalized weakness    Lower Extremity Assessment Lower Extremity Assessment: Overall WFL for tasks assessed;Generalized weakness       Communication   Communication: No difficulties  Cognition Arousal/Alertness: Awake/alert Behavior During Therapy: WFL for tasks assessed/performed Overall Cognitive Status: Within Functional Limits for tasks assessed                                        General Comments      Exercises     Assessment/Plan    PT Assessment Patient needs continued PT  services  PT Problem List Decreased strength;Decreased range of motion;Decreased activity tolerance;Decreased balance;Decreased mobility;Decreased knowledge of use of DME;Decreased safety awareness;Cardiopulmonary status limiting activity       PT Treatment Interventions Gait training;DME instruction;Stair training;Functional mobility training;Therapeutic activities;Therapeutic exercise;Balance training;Neuromuscular re-education;Patient/family education    PT Goals (Current goals can be found in the Care Plan section)  Acute Rehab PT Goals Patient Stated Goal: get back to walking better PT Goal Formulation: With patient Time For Goal Achievement: 01/28/18 Potential to Achieve Goals: Fair    Frequency Min 2X/week   Barriers to discharge        Co-evaluation               AM-PAC PT "6 Clicks" Daily Activity  Outcome Measure Difficulty turning over in bed (including  adjusting bedclothes, sheets and blankets)?: None Difficulty moving from lying on back to sitting on the side of the bed? : None Difficulty sitting down on and standing up from a chair with arms (e.g., wheelchair, bedside commode, etc,.)?: None Help needed moving to and from a bed to chair (including a wheelchair)?: None Help needed walking in hospital room?: A Little Help needed climbing 3-5 steps with a railing? : A Little 6 Click Score: 22    End of Session Equipment Utilized During Treatment: Gait belt;Oxygen(3 liters) Activity Tolerance: Patient tolerated treatment well Patient left: with chair alarm set;with call bell/phone within reach   PT Visit Diagnosis: Muscle weakness (generalized) (M62.81);Difficulty in walking, not elsewhere classified (R26.2)    Time: 1448-1856 PT Time Calculation (min) (ACUTE ONLY): 23 min   Charges:   PT Evaluation $PT Eval Low Complexity: 1 Low     PT G CodesKreg Shropshire, DPT 01/14/2018, 12:54 PM

## 2018-01-14 NOTE — Progress Notes (Signed)
Inpatient Diabetes Program Recommendations  AACE/ADA: New Consensus Statement on Inpatient Glycemic Control (2015)  Target Ranges:  Prepandial:   less than 140 mg/dL      Peak postprandial:   less than 180 mg/dL (1-2 hours)      Critically ill patients:  140 - 180 mg/dL   Lab Results  Component Value Date   GLUCAP 187 (H) 01/14/2018   HGBA1C 10.3 (H) 01/10/2018    Review of Glycemic Control  Results for [name], [name] (MRN [id number]) as of 01/14/2018 08:53  Ref. Range 01/13/2018 07:50 01/13/2018 11:05 01/13/2018 16:40 01/13/2018 20:42 01/14/2018 07:45  Glucose-Capillary Latest Ref Range: 65 - 99 mg/dL 183 (H) 234 (H) 254 (H) 201 (H) 187 (H)    Diabetes history: Type 2 Outpatient Diabetes medications: Novolog 35 units tid, Lantus 90 units bid  Current orders for Inpatient glycemic control: Lantus 40 units at hs , Novolog 0-15 units tid, Novolog 0-5 units qhs, Novolog 15 units tid with meals  Inpatient Diabetes Program Recommendations: Consider increasing Novolog to 18 units tid (post prandial CBG remain elevated despite current dose)  Gentry Fitz, RN, BA, [place], CDE Diabetes Coordinator Inpatient Diabetes Program  [phone] (Team Pager) [phone] ([place]) 01/14/2018 8:58 AM

## 2018-01-14 NOTE — Progress Notes (Signed)
MD made rounds. Orders received to discharge home. Discharge paperwork provided, explained and signed. No unanswered questions. Discharged via wheelchair by nursing staff. Belongings sent with patient.

## 2018-01-21 ENCOUNTER — Ambulatory Visit: Payer: BLUE CROSS/BLUE SHIELD | Admitting: Family

## 2018-01-21 ENCOUNTER — Telehealth: Payer: Self-pay | Admitting: Family

## 2018-01-21 NOTE — Telephone Encounter (Signed)
Patient did not show for his Heart Failure Clinic appointment on 01/21/18. Will attempt to reschedule.

## 2018-01-23 ENCOUNTER — Ambulatory Visit: Payer: Medicaid Other | Admitting: Podiatry

## 2018-01-30 ENCOUNTER — Ambulatory Visit: Payer: BLUE CROSS/BLUE SHIELD | Admitting: Family

## 2018-01-30 NOTE — Progress Notes (Deleted)
   Patient ID: [name], male    DOB: May 26, 1966, 52 y.o.   MRN: [id number]  HPI  [name] is a 52 y/o male with a history of  Echo report from 03/26/17 reviewed and showed an EF of 40-45%. Cardiac catheterization done 03/28/17 showed moderate pulmonary HTN, preserved cardiac output and elevated right heart filling pressures.  Admitted 01/09/18 due to HF exacerbation and hypoventilation syndrome. Cardiology consult obtained. Initially needed IV diuretics and then transitioned to oral diuretics. Discharged after 5 days with home health. Was in the ED 01/04/18 due to chest pain where he was treated and released.   He presents today for his initial visit with a chief complaint of  Review of Systems    Physical Exam    Assessment & Plan:  1: Chronic heart failure with preserved ejection fraction- - NYHA class - BNP from 01/09/18 62.0  2: HTN- - BMP from 01/14/18 reviewed and showed sodium 136, potassium 4.0 and GFR >60  3: Diabetes- - A1c on 01/10/18 was 10.3%

## 2018-02-03 ENCOUNTER — Encounter: Payer: Self-pay | Admitting: Podiatry

## 2018-02-03 ENCOUNTER — Ambulatory Visit: Payer: Medicaid Other | Admitting: Podiatry

## 2018-02-03 DIAGNOSIS — M79676 Pain in unspecified toe(s): Secondary | ICD-10-CM

## 2018-02-03 DIAGNOSIS — L409 Psoriasis, unspecified: Secondary | ICD-10-CM

## 2018-02-03 DIAGNOSIS — B351 Tinea unguium: Secondary | ICD-10-CM

## 2018-02-03 DIAGNOSIS — I872 Venous insufficiency (chronic) (peripheral): Secondary | ICD-10-CM

## 2018-02-03 DIAGNOSIS — E1142 Type 2 diabetes mellitus with diabetic polyneuropathy: Secondary | ICD-10-CM

## 2018-02-03 NOTE — Progress Notes (Signed)
Complaint:  Visit Type: Patient returns to my office for continued preventative foot care services. Complaint: Patient states" my nails have grown long and thick and become painful to walk and wear shoes" Patient has been diagnosed with DM with no foot complications. The patient presents for preventative foot care services. No changes to ROS.  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  Podiatric Exam: Vascular: dorsalis pedis and posterior tibial pulses are palpable bilateral. Capillary return is immediate. Temperature gradient is WNL. Skin turgor WNL  Sensorium: Normal Semmes Weinstein monofilament test. Normal tactile sensation bilaterally. Nail Exam: Pt has thick disfigured discolored nails with subungual debris noted bilateral entire nail hallux through fifth toenails Ulcer Exam: There is no evidence of ulcer or pre-ulcerative changes or infection. Orthopedic Exam: Muscle tone and strength are WNL. No limitations in general ROM. No crepitus or effusions noted. Foot type and digits show no abnormalities. Bony prominences are unremarkable. Skin: No Porokeratosis. No infection or ulcers.  Psoriasis legs. Punctate Psoriasis plantar aspect both feet.          Diagnosis:  Onychomycosis, , Pain in right toe, pain in left toes  Treatment & Plan Procedures and Treatment: Consent by patient was obtained for treatment procedures. The patient understood the discussion of treatment and procedures well. All questions were answered thoroughly reviewed. Debridement of mycotic and hypertrophic toenails, 1 through 5 bilateral and clearing of subungual debris. No ulceration, no infection noted.  Patient to be contacted about plaque psoriasis.  Return Visit-Office Procedure: Patient instructed to return to the office for a follow up visit 10 weeks  for continued evaluation and treatment.    Gardiner Barefoot DPM

## 2018-02-04 ENCOUNTER — Telehealth: Payer: Self-pay | Admitting: *Deleted

## 2018-02-04 NOTE — Telephone Encounter (Signed)
Pt states he is returning a call  

## 2018-02-07 NOTE — Progress Notes (Signed)
Patient ID: [name], male    DOB: 1966-06-12, 52 y.o.   MRN: [id number]  HPI  [name] is a 52 y/o male with a history of DM, HTN, CKD, bell's palsy, NSVT, obstructive sleep apnea, pulmonary HTN and chronic heart failure.   Echo report from 03/26/17 reviewed and showed an EF of 40-45%. Cardiac catheterization done 03/28/17 showed moderate pulmonary HTN, preserved cardiac output and elevated right heart filling pressures.  Admitted 01/09/18 due to HF exacerbation and hypoventilation syndrome. Cardiology consult obtained. Initially needed IV diuretics and then transitioned to oral diuretics. Discharged after 5 days with home health. Was in the ED 01/04/18 due to chest pain where [name] was treated and released.   [name] presents today for his initial visit with a chief complaint of moderate fatigue upon little exertion. [name] describes this as chronic in nature having been present for several years. [name] has associated blurry vision (known cataracts), shortness of breath, chronic back pain, anxiety and difficulty sleeping along with this. [name] denies any chest pain, edema, palpitations, abdominal distention, dizziness or weight gain.   Past Medical History:  Diagnosis Date  . Bell's palsy   . CHF (congestive heart failure) ([place])   . Chronic kidney disease   . Diabetes mellitus without complication ([place])   . Hypertension   . NSVT (nonsustained ventricular tachycardia) ([place])   . Obstructive sleep apnea   . Pulmonary HTN ([place])    History reviewed. No pertinent surgical history. Family History  Problem Relation Age of Onset  . Heart failure Mother   . Kidney failure Brother    Social History   Tobacco Use  . Smoking status: Never Smoker  . Smokeless tobacco: Never Used  Substance Use Topics  . Alcohol use: Yes    Comment: very rarely   No Known Allergies Prior to Admission medications   Medication Sig Start Date End Date Taking? Authorizing Provider  albuterol (PROVENTIL HFA;VENTOLIN  HFA) 108 (90 Base) MCG/ACT inhaler Inhale 2 puffs into the lungs every 4 (four) hours as needed for wheezing or shortness of breath. 02/07/17  Yes Odem, Coolidge Breeze, FNP  allopurinol (ZYLOPRIM) 300 MG tablet Take 1 tablet (300 mg total) by mouth daily. 02/14/17  Yes Crissman, Jeannette How, MD  ammonium lactate (AMLACTIN) 12 % cream APPLY TOPICALLY AS NEEDED FOR DRY SKIN. 12/03/17  Yes Gardiner Barefoot, DPM  aspirin EC 81 MG tablet Take 1 tablet (81 mg total) by mouth daily. 02/07/17  Yes Odem, Coolidge Breeze, FNP  atorvastatin (LIPITOR) 40 MG tablet Take 1 tablet (40 mg total) by mouth at bedtime. 02/14/17  Yes Crissman, Jeannette How, MD  carvedilol (COREG) 25 MG tablet Take 1 tablet (25 mg total) by mouth every 12 (twelve) hours. 02/14/17  Yes Crissman, Jeannette How, MD  Cholecalciferol (D3-1000) 1000 units tablet Take 4,000 Units by mouth daily.   Yes [provider]  cyclobenzaprine (FLEXERIL) 5 MG tablet Take 5 mg by mouth as needed. 02/06/18  Yes [provider]  eplerenone (INSPRA) 50 MG tablet Take 50 mg by mouth daily.   Yes [provider]  gabapentin (NEURONTIN) 300 MG capsule Take 2 capsules (600 mg total) by mouth 3 (three) times daily. 02/14/17  Yes Crissman, Jeannette How, MD  hydrALAZINE (APRESOLINE) 50 MG tablet Take 1 tablet (50 mg total) by mouth 3 (three) times daily. 02/14/17  Yes Crissman, Jeannette How, MD  insulin aspart (NOVOLOG) 100 UNIT/ML injection Inject 20-35 Units into the skin 3 (three) times daily before  meals. Patient taking differently: Inject 35 Units into the skin 3 (three) times daily before meals.  02/28/17  Yes Doles-Johnson, Teah, NP  insulin glargine (LANTUS) 100 UNIT/ML injection Inject 0.65-0.8 mLs (65-80 Units total) into the skin at bedtime. Patient taking differently: Inject 90 Units into the skin 2 (two) times daily.  02/28/17  Yes Doles-Johnson, Teah, NP  isosorbide dinitrate (ISORDIL) 20 MG tablet Take 1 tablet (20 mg total) by mouth 3 (three) times daily. 02/14/17  Yes Crissman, Jeannette How,  MD  magnesium oxide (MAG-OX) 400 MG tablet Take 400 mg by mouth 2 (two) times daily.   Yes [provider]  omeprazole (PRILOSEC) 20 MG capsule Take 1 capsule (20 mg total) by mouth 2 (two) times daily before a meal. 02/14/17  Yes Crissman, Jeannette How, MD  torsemide (DEMADEX) 100 MG tablet Take 100 mg by mouth 2 (two) times daily.   Yes [provider]  triamcinolone cream (KENALOG) 0.1 % Apply 1 application topically 2 (two) times daily. 02/14/17  Yes Crissman, Jeannette How, MD  triamcinolone ointment (KENALOG) 0.5 % Apply 1 application topically 2 (two) times daily. 02/08/17  Yes Tawni Millers, MD  VICTOZA 18 MG/3ML SOPN Inject 0.6 mg into the skin daily. 12/25/17  Yes [provider]  metolazone (ZAROXOLYN) 2.5 MG tablet Take 1 tablet (2.5 mg total) by mouth as needed. 02/10/18   Alisa Graff, FNP    Review of Systems  Constitutional: Positive for fatigue. Negative for appetite change.  HENT: Negative for congestion, postnasal drip and sore throat.   Eyes: Positive for discharge (clear discharge) and visual disturbance (blurry vision).  Respiratory: Positive for shortness of breath. Negative for chest tightness.   Cardiovascular: Negative for chest pain, palpitations and leg swelling.  Gastrointestinal: Negative for abdominal distention and abdominal pain.  Endocrine: Negative.   Genitourinary: Negative.   Musculoskeletal: Positive for back pain (chronic). Negative for neck pain.  Skin: Negative.   Allergic/Immunologic: Negative.   Neurological: Negative for dizziness and light-headedness.  Hematological: Negative for adenopathy. Does not bruise/bleed easily.  Psychiatric/Behavioral: Positive for sleep disturbance (sleeping interrupted). Negative for dysphoric mood. The patient is nervous/anxious (when rushing around).    Vitals:   02/10/18 1101  BP: 122/60  Pulse: 93  Resp: 18  SpO2: (!) 89%  Weight: (!) 302 lb 4 oz (137.1 kg)  Height: 5\' 3"  (1.6 m)   Wt Readings  from Last 3 Encounters:  02/10/18 (!) 302 lb 4 oz (137.1 kg)  01/14/18 299 lb 14.4 oz (136 kg)  03/14/17 296 lb 8 oz (134.5 kg)   Lab Results  Component Value Date   CREATININE 1.50 (H) 01/14/2018   CREATININE 1.63 (H) 01/13/2018   CREATININE 1.86 (H) 01/12/2018    Physical Exam  Constitutional: [name] is oriented to person, place, and time. [name] appears well-developed and well-nourished.  HENT:  Head: Normocephalic and atraumatic.  Neck: Normal range of motion. Neck supple. No JVD present.  Cardiovascular: Normal rate and regular rhythm.  Pulmonary/Chest: Effort normal. [name] has no wheezes. [name] has no rales.  Abdominal: Soft. [name] exhibits no distension. There is no tenderness.  Musculoskeletal: [name] exhibits no edema or tenderness.  Neurological: [name] is alert and oriented to person, place, and time.  Skin: Skin is warm and dry.  Psychiatric: [name] has a normal mood and affect. His behavior is normal. Thought content normal.  Nursing note and vitals reviewed.  Assessment & Plan:  1: Chronic heart failure with preserved ejection fraction- -  NYHA class III - euvolemic today - weighing daily and says that [name]'s gradually lost weight. Instructed to call for an overnight weight gain of >2 pounds or a weekly weight gain of >5 pounds - [name] says that his weight at his PCP's office on 02/06/18 was 307 pounds and today [name] is 302.4 pounds - not adding salt and has been trying to read food labels. Does rinse off canned foods. Reviewed the importance of closely following a 2000mg  sodium diet and written dietary information was given to him about this - wearing compression socks daily; encouraged him to elevate his legs when sitting for long periods of time - saw cardiology at [facility]) 09/26/17 & is waiting on a follow-up appointment - reports receiving his flu vaccine for this season - BNP from 01/09/18 62.0 - may need pulmonology referral due to pulmonary HTN  2: HTN- - BP looks good today - saw PCP  ([place]) 02/06/18 - BMP from 01/14/18 reviewed and showed sodium 136, potassium 4.0 and GFR >60  3: Diabetes- - saw nephrology Radene Knee) November 2018 - A1c on 01/10/18 was 10.3% - has eye doctor appointment scheduled - fasting glucose at home this morning was 158  4: Obstructive sleep apnea- - wearing bipap at night - wearing oxygen at 3L around the clock and says that his breathing has improved since doing that  Patient did not bring his medications nor a list. Each medication was verbally reviewed with the patient and [name] was encouraged to bring the bottles to every visit to confirm accuracy of list.  Return in 1 month or sooner for any questions/problems before then.

## 2018-02-10 ENCOUNTER — Encounter: Payer: Self-pay | Admitting: Family

## 2018-02-10 ENCOUNTER — Ambulatory Visit: Payer: BLUE CROSS/BLUE SHIELD | Attending: Family | Admitting: Family

## 2018-02-10 VITALS — BP 122/60 | HR 93 | Resp 18 | Ht 63.0 in | Wt 302.2 lb

## 2018-02-10 DIAGNOSIS — M549 Dorsalgia, unspecified: Secondary | ICD-10-CM | POA: Diagnosis not present

## 2018-02-10 DIAGNOSIS — G4733 Obstructive sleep apnea (adult) (pediatric): Secondary | ICD-10-CM | POA: Diagnosis not present

## 2018-02-10 DIAGNOSIS — Z8249 Family history of ischemic heart disease and other diseases of the circulatory system: Secondary | ICD-10-CM | POA: Diagnosis not present

## 2018-02-10 DIAGNOSIS — Z7982 Long term (current) use of aspirin: Secondary | ICD-10-CM | POA: Insufficient documentation

## 2018-02-10 DIAGNOSIS — N189 Chronic kidney disease, unspecified: Secondary | ICD-10-CM | POA: Diagnosis not present

## 2018-02-10 DIAGNOSIS — F419 Anxiety disorder, unspecified: Secondary | ICD-10-CM | POA: Diagnosis not present

## 2018-02-10 DIAGNOSIS — G9340 Encephalopathy, unspecified: Secondary | ICD-10-CM | POA: Insufficient documentation

## 2018-02-10 DIAGNOSIS — E1122 Type 2 diabetes mellitus with diabetic chronic kidney disease: Secondary | ICD-10-CM | POA: Diagnosis not present

## 2018-02-10 DIAGNOSIS — I272 Pulmonary hypertension, unspecified: Secondary | ICD-10-CM | POA: Insufficient documentation

## 2018-02-10 DIAGNOSIS — G8929 Other chronic pain: Secondary | ICD-10-CM | POA: Insufficient documentation

## 2018-02-10 DIAGNOSIS — Z841 Family history of disorders of kidney and ureter: Secondary | ICD-10-CM | POA: Insufficient documentation

## 2018-02-10 DIAGNOSIS — E1136 Type 2 diabetes mellitus with diabetic cataract: Secondary | ICD-10-CM | POA: Insufficient documentation

## 2018-02-10 DIAGNOSIS — I509 Heart failure, unspecified: Secondary | ICD-10-CM | POA: Diagnosis present

## 2018-02-10 DIAGNOSIS — Z79899 Other long term (current) drug therapy: Secondary | ICD-10-CM | POA: Insufficient documentation

## 2018-02-10 DIAGNOSIS — Z794 Long term (current) use of insulin: Secondary | ICD-10-CM | POA: Diagnosis not present

## 2018-02-10 DIAGNOSIS — I5032 Chronic diastolic (congestive) heart failure: Secondary | ICD-10-CM

## 2018-02-10 DIAGNOSIS — I130 Hypertensive heart and chronic kidney disease with heart failure and stage 1 through stage 4 chronic kidney disease, or unspecified chronic kidney disease: Secondary | ICD-10-CM | POA: Diagnosis not present

## 2018-02-10 DIAGNOSIS — I10 Essential (primary) hypertension: Secondary | ICD-10-CM

## 2018-02-10 DIAGNOSIS — E119 Type 2 diabetes mellitus without complications: Secondary | ICD-10-CM

## 2018-02-10 MED ORDER — METOLAZONE 2.5 MG PO TABS: 2.5000 mg | ORAL_TABLET | ORAL | 2 refills | Status: DC | PRN

## 2018-02-10 NOTE — Patient Instructions (Addendum)
Continue weighing daily and call for an overnight weight gain of > 2 pounds or a weekly weight gain of >5 pounds.  Can try melatonin at bedtime to help with sleeping.    Low-Sodium Eating Plan Sodium, which is an element that makes up salt, helps you maintain a healthy balance of fluids in your body. Too much sodium can increase your blood pressure and cause fluid and waste to be held in your body. Your health care provider or dietitian may recommend following this plan if you have high blood pressure (hypertension), kidney disease, liver disease, or heart failure. Eating less sodium can help lower your blood pressure, reduce swelling, and protect your heart, liver, and kidneys. What are tips for following this plan? General guidelines  Most people on this plan should limit their sodium intake to 2,000 mg (milligrams) of sodium each day. Reading food labels  The Nutrition Facts label lists the amount of sodium in one serving of the food. If you eat more than one serving, you must multiply the listed amount of sodium by the number of servings.  Choose foods with less than 140 mg of sodium per serving.  Avoid foods with 300 mg of sodium or more per serving. Shopping  Look for lower-sodium products, often labeled as "low-sodium" or "no salt added."  Always check the sodium content even if foods are labeled as "unsalted" or "no salt added".  Buy fresh foods. ? Avoid canned foods and premade or frozen meals. ? Avoid canned, cured, or processed meats  Buy breads that have less than 80 mg of sodium per slice. Cooking  Eat more home-cooked food and less restaurant, buffet, and fast food.  Avoid adding salt when cooking. Use salt-free seasonings or herbs instead of table salt or sea salt. Check with your health care provider or pharmacist before using salt substitutes.  Cook with plant-based oils, such as canola, sunflower, or olive oil. Meal planning  When eating at a restaurant, ask  that your food be prepared with less salt or no salt, if possible.  Avoid foods that contain MSG (monosodium glutamate). MSG is sometimes added to [place] food, bouillon, and some canned foods. What foods are recommended? The items listed may not be a complete list. Talk with your dietitian about what dietary choices are best for you. Grains Low-sodium cereals, including oats, puffed wheat and rice, and shredded wheat. Low-sodium crackers. Unsalted rice. Unsalted pasta. Low-sodium bread. Whole-grain breads and whole-grain pasta. Vegetables Fresh or frozen vegetables. "No salt added" canned vegetables. "No salt added" tomato sauce and paste. Low-sodium or reduced-sodium tomato and vegetable juice. Fruits Fresh, frozen, or canned fruit. Fruit juice. Meats and other protein foods Fresh or frozen (no salt added) meat, poultry, seafood, and fish. Low-sodium canned tuna and salmon. Unsalted nuts. Dried peas, beans, and lentils without added salt. Unsalted canned beans. Eggs. Unsalted nut butters. Dairy Milk. Soy milk. Cheese that is naturally low in sodium, such as ricotta cheese, fresh mozzarella, or Swiss cheese Low-sodium or reduced-sodium cheese. Cream cheese. Yogurt. Fats and oils Unsalted butter. Unsalted margarine with no trans fat. Vegetable oils such as canola or olive oils. Seasonings and other foods Fresh and dried herbs and spices. Salt-free seasonings. Low-sodium mustard and ketchup. Sodium-free salad dressing. Sodium-free light mayonnaise. Fresh or refrigerated horseradish. Lemon juice. Vinegar. Homemade, reduced-sodium, or low-sodium soups. Unsalted popcorn and pretzels. Low-salt or salt-free chips. What foods are not recommended? The items listed may not be a complete list. Talk with your dietitian about  what dietary choices are best for you. Grains Instant hot cereals. Bread stuffing, pancake, and biscuit mixes. Croutons. Seasoned rice or pasta mixes. Noodle soup cups. Boxed or  frozen macaroni and cheese. Regular salted crackers. Self-rising flour. Vegetables Sauerkraut, pickled vegetables, and relishes. Olives. [place] fries. Onion rings. Regular canned vegetables (not low-sodium or reduced-sodium). Regular canned tomato sauce and paste (not low-sodium or reduced-sodium). Regular tomato and vegetable juice (not low-sodium or reduced-sodium). Frozen vegetables in sauces. Meats and other protein foods Meat or fish that is salted, canned, smoked, spiced, or pickled. Bacon, ham, sausage, hotdogs, corned beef, chipped beef, packaged lunch meats, salt pork, jerky, pickled herring, anchovies, regular canned tuna, sardines, salted nuts. Dairy Processed cheese and cheese spreads. Cheese curds. Blue cheese. Feta cheese. String cheese. Regular cottage cheese. Buttermilk. Canned milk. Fats and oils Salted butter. Regular margarine. Ghee. Bacon fat. Seasonings and other foods Onion salt, garlic salt, seasoned salt, table salt, and sea salt. Canned and packaged gravies. Worcestershire sauce. Tartar sauce. Barbecue sauce. Teriyaki sauce. Soy sauce, including reduced-sodium. Steak sauce. Fish sauce. Oyster sauce. Cocktail sauce. Horseradish that you find on the shelf. Regular ketchup and mustard. Meat flavorings and tenderizers. Bouillon cubes. Hot sauce and Tabasco sauce. Premade or packaged marinades. Premade or packaged taco seasonings. Relishes. Regular salad dressings. Salsa. Potato and tortilla chips. Corn chips and puffs. Salted popcorn and pretzels. Canned or dried soups. Pizza. Frozen entrees and pot pies. Summary  Eating less sodium can help lower your blood pressure, reduce swelling, and protect your heart, liver, and kidneys.  Most people on this plan should limit their sodium intake to 1,500-2,000 mg (milligrams) of sodium each day.  Canned, boxed, and frozen foods are high in sodium. Restaurant foods, fast foods, and pizza are also very high in sodium. You also get sodium by  adding salt to food.  Try to cook at home, eat more fresh fruits and vegetables, and eat less fast food, canned, processed, or prepared foods. This information is not intended to replace advice given to you by your health care provider. Make sure you discuss any questions you have with your health care provider. Document Released: 05/25/2002 Document Revised: 11/26/2016 Document Reviewed: 11/26/2016 Elsevier Interactive Patient Education  [employer].

## 2018-03-17 ENCOUNTER — Ambulatory Visit: Payer: BLUE CROSS/BLUE SHIELD | Admitting: Family

## 2018-03-17 ENCOUNTER — Telehealth: Payer: Self-pay | Admitting: Family

## 2018-03-17 NOTE — Progress Notes (Deleted)
Patient ID: NOWELL SITES, male    DOB: 02-29-1966, 52 y.o.   MRN: [id number]  HPI  Mr Meisinger is a 52 y/o male with a history of DM, HTN, CKD, bell's palsy, NSVT, obstructive sleep apnea, pulmonary HTN and chronic heart failure.   Echo report from 03/26/17 reviewed and showed an EF of 40-45%. Cardiac catheterization done 03/28/17 showed moderate pulmonary HTN, preserved cardiac output and elevated right heart filling pressures.  Admitted 01/09/18 due to HF exacerbation and hypoventilation syndrome. Cardiology consult obtained. Initially needed IV diuretics and then transitioned to oral diuretics. Discharged after 5 days with home health. Was in the ED 01/04/18 due to chest pain where he was treated and released.   He presents today for a follow-up visit with a chief complaint of    Past Medical History:  Diagnosis Date  . Bell's palsy   . CHF (congestive heart failure) ([place])   . Chronic kidney disease   . Diabetes mellitus without complication ([place])   . Hypertension   . NSVT (nonsustained ventricular tachycardia) ([place])   . Obstructive sleep apnea   . Pulmonary HTN ([place])    No past surgical history on file. Family History  Problem Relation Age of Onset  . Heart failure Mother   . Kidney failure Brother    Social History   Tobacco Use  . Smoking status: Never Smoker  . Smokeless tobacco: Never Used  Substance Use Topics  . Alcohol use: Yes    Comment: very rarely   No Known Allergies   Review of Systems  Constitutional: Positive for fatigue. Negative for appetite change.  HENT: Negative for congestion, postnasal drip and sore throat.   Eyes: Positive for discharge (clear discharge) and visual disturbance (blurry vision).  Respiratory: Positive for shortness of breath. Negative for chest tightness.   Cardiovascular: Negative for chest pain, palpitations and leg swelling.  Gastrointestinal: Negative for abdominal distention and abdominal pain.  Endocrine: Negative.    Genitourinary: Negative.   Musculoskeletal: Positive for back pain (chronic). Negative for neck pain.  Skin: Negative.   Allergic/Immunologic: Negative.   Neurological: Negative for dizziness and light-headedness.  Hematological: Negative for adenopathy. Does not bruise/bleed easily.  Psychiatric/Behavioral: Positive for sleep disturbance (sleeping interrupted). Negative for dysphoric mood. The patient is nervous/anxious (when rushing around).      Physical Exam  Constitutional: He is oriented to person, place, and time. He appears well-developed and well-nourished.  HENT:  Head: Normocephalic and atraumatic.  Neck: Normal range of motion. Neck supple. No JVD present.  Cardiovascular: Normal rate and regular rhythm.  Pulmonary/Chest: Effort normal. He has no wheezes. He has no rales.  Abdominal: Soft. He exhibits no distension. There is no tenderness.  Musculoskeletal: He exhibits no edema or tenderness.  Neurological: He is alert and oriented to person, place, and time.  Skin: Skin is warm and dry.  Psychiatric: He has a normal mood and affect. His behavior is normal. Thought content normal.  Nursing note and vitals reviewed.  Assessment & Plan:  1: Chronic heart failure with preserved ejection fraction- - NYHA class III - euvolemic today - weighing daily and says that he's gradually lost weight. Instructed to call for an overnight weight gain of >2 pounds or a weekly weight gain of >5 pounds - he says that his weight at his PCP's office on 02/06/18 was 307 pounds and today he is 302.4 pounds - not adding salt and has been trying to read food labels. Does rinse off canned  foods. Reviewed the importance of closely following a 2000mg  sodium diet and written dietary information was given to him about this - wearing compression socks daily; encouraged him to elevate his legs when sitting for long periods of time - saw cardiology at [facility] Morton Stall) 09/26/17 & is waiting on a follow-up  appointment - reports receiving his flu vaccine for this season - BNP from 01/09/18 62.0 - may need pulmonology referral due to pulmonary HTN  2: HTN- - BP looks good today - saw PCP ([facility]) 02/06/18 - BMP from 01/14/18 reviewed and showed sodium 136, potassium 4.0 and GFR >60  3: Diabetes- - saw nephrology Radene Knee) November 2018 - A1c on 01/10/18 was 10.3% - has eye doctor appointment scheduled - fasting glucose at home this morning was   4: Obstructive sleep apnea- - wearing bipap at night - wearing oxygen at 3L around the clock and says that his breathing has improved since doing that  Patient did not bring his medications nor a list. Each medication was verbally reviewed with the patient and he was encouraged to bring the bottles to every visit to confirm accuracy of list.

## 2018-03-17 NOTE — Telephone Encounter (Signed)
Patient did not show for his [facility] appointment on 03/17/18. Will attempt to reschedule.

## 2018-04-14 ENCOUNTER — Ambulatory Visit: Payer: BLUE CROSS/BLUE SHIELD | Admitting: Podiatry

## 2018-04-23 DIAGNOSIS — Z6841 Body Mass Index (BMI) 40.0 and over, adult: Secondary | ICD-10-CM

## 2018-04-26 DIAGNOSIS — J9622 Acute and chronic respiratory failure with hypercapnia: Secondary | ICD-10-CM

## 2018-04-26 DIAGNOSIS — J9621 Acute and chronic respiratory failure with hypoxia: Secondary | ICD-10-CM | POA: Insufficient documentation

## 2018-04-28 DIAGNOSIS — N189 Chronic kidney disease, unspecified: Secondary | ICD-10-CM

## 2018-04-28 DIAGNOSIS — N179 Acute kidney failure, unspecified: Secondary | ICD-10-CM | POA: Insufficient documentation

## 2018-05-15 ENCOUNTER — Ambulatory Visit (INDEPENDENT_AMBULATORY_CARE_PROVIDER_SITE_OTHER): Payer: Medicaid Other | Admitting: Podiatry

## 2018-05-15 ENCOUNTER — Encounter: Payer: Self-pay | Admitting: Podiatry

## 2018-05-15 DIAGNOSIS — L409 Psoriasis, unspecified: Secondary | ICD-10-CM

## 2018-05-15 DIAGNOSIS — B351 Tinea unguium: Secondary | ICD-10-CM | POA: Diagnosis not present

## 2018-05-15 DIAGNOSIS — M79676 Pain in unspecified toe(s): Secondary | ICD-10-CM | POA: Diagnosis not present

## 2018-05-15 DIAGNOSIS — E1142 Type 2 diabetes mellitus with diabetic polyneuropathy: Secondary | ICD-10-CM

## 2018-05-15 DIAGNOSIS — I872 Venous insufficiency (chronic) (peripheral): Secondary | ICD-10-CM

## 2018-05-15 NOTE — Progress Notes (Signed)
Complaint:  Visit Type: Patient returns to my office for continued preventative foot care services. Complaint: Patient states" my nails have grown long and thick and become painful to walk and wear shoes" Patient has been diagnosed with DM with no foot complications. The patient presents for preventative foot care services. No changes to ROS.  Patient has been in the hospital for collapsed lung.  Patient has done limited walking and been applying medicine to his skin and the psoriasis has improved.  Podiatric Exam: Vascular: dorsalis pedis and posterior tibial pulses are palpable bilateral. Capillary return is immediate. Temperature gradient is WNL. Skin turgor WNL  Sensorium: Normal Semmes Weinstein monofilament test. Normal tactile sensation bilaterally. Nail Exam: Pt has thick disfigured discolored nails with subungual debris noted bilateral entire nail hallux through fifth toenails Ulcer Exam: There is no evidence of ulcer or pre-ulcerative changes or infection. Orthopedic Exam: Muscle tone and strength are WNL. No limitations in general ROM. No crepitus or effusions noted. Foot type and digits show no abnormalities. Bony prominences are unremarkable. Skin: No Porokeratosis. No infection or ulcers.  Improvement of his punctate psoriasis plantar aspect both feet.  Diagnosis:  Onychomycosis, , Pain in right toe, pain in left toes  Treatment & Plan Procedures and Treatment: Consent by patient was obtained for treatment procedures.   Debridement of mycotic and hypertrophic toenails, 1 through 5 bilateral and clearing of subungual debris. No ulceration, no infection noted.  Return Visit-Office Procedure: Patient instructed to return to the office for a follow up visit 10 weeks  for continued evaluation and treatment.    Gardiner Barefoot DPM

## 2018-06-10 ENCOUNTER — Ambulatory Visit: Payer: Self-pay

## 2018-06-12 ENCOUNTER — Ambulatory Visit: Payer: Self-pay

## 2018-06-12 ENCOUNTER — Ambulatory Visit: Payer: Self-pay | Admitting: Adult Health Nurse Practitioner

## 2018-06-12 ENCOUNTER — Ambulatory Visit: Payer: Self-pay | Admitting: Licensed Clinical Social Worker

## 2018-06-12 VITALS — BP 116/74 | HR 83 | Temp 98.2°F | Ht 63.0 in | Wt 308.7 lb

## 2018-06-12 DIAGNOSIS — E119 Type 2 diabetes mellitus without complications: Secondary | ICD-10-CM

## 2018-06-12 DIAGNOSIS — E7800 Pure hypercholesterolemia, unspecified: Secondary | ICD-10-CM

## 2018-06-12 DIAGNOSIS — L409 Psoriasis, unspecified: Secondary | ICD-10-CM

## 2018-06-12 DIAGNOSIS — G4733 Obstructive sleep apnea (adult) (pediatric): Secondary | ICD-10-CM

## 2018-06-12 DIAGNOSIS — E1142 Type 2 diabetes mellitus with diabetic polyneuropathy: Secondary | ICD-10-CM

## 2018-06-12 DIAGNOSIS — I5032 Chronic diastolic (congestive) heart failure: Secondary | ICD-10-CM

## 2018-06-12 DIAGNOSIS — L03116 Cellulitis of left lower limb: Secondary | ICD-10-CM

## 2018-06-12 DIAGNOSIS — I429 Cardiomyopathy, unspecified: Secondary | ICD-10-CM

## 2018-06-12 DIAGNOSIS — M1A09X0 Idiopathic chronic gout, multiple sites, without tophus (tophi): Secondary | ICD-10-CM

## 2018-06-12 DIAGNOSIS — N182 Chronic kidney disease, stage 2 (mild): Secondary | ICD-10-CM

## 2018-06-12 MED ORDER — OMEPRAZOLE 20 MG PO CPDR: 20.0000 mg | DELAYED_RELEASE_CAPSULE | Freq: Two times a day (BID) | ORAL | 2 refills | Status: DC

## 2018-06-12 MED ORDER — INSULIN GLARGINE 100 UNIT/ML ~~LOC~~ SOLN: 90.0000 [IU] | Freq: Two times a day (BID) | SUBCUTANEOUS | 10 refills | Status: DC

## 2018-06-12 MED ORDER — ISOSORBIDE DINITRATE 20 MG PO TABS: 20.0000 mg | ORAL_TABLET | Freq: Three times a day (TID) | ORAL | 2 refills | Status: DC

## 2018-06-12 MED ORDER — INSULIN REGULAR HUMAN 100 UNIT/ML IJ SOLN: 50.0000 [IU] | Freq: Three times a day (TID) | INTRAMUSCULAR | 10 refills | Status: DC

## 2018-06-12 MED ORDER — ATORVASTATIN CALCIUM 40 MG PO TABS: 40.0000 mg | ORAL_TABLET | Freq: Every day | ORAL | 3 refills | Status: DC

## 2018-06-12 MED ORDER — ALLOPURINOL 300 MG PO TABS: 300.0000 mg | ORAL_TABLET | Freq: Every day | ORAL | 4 refills | Status: DC

## 2018-06-12 MED ORDER — METOLAZONE 2.5 MG PO TABS: 2.5000 mg | ORAL_TABLET | ORAL | 2 refills | Status: DC | PRN

## 2018-06-12 MED ORDER — ASPIRIN EC 81 MG PO TBEC: 81.0000 mg | DELAYED_RELEASE_TABLET | Freq: Every day | ORAL | 0 refills | Status: DC

## 2018-06-12 MED ORDER — GABAPENTIN 300 MG PO CAPS: 600.0000 mg | ORAL_CAPSULE | Freq: Three times a day (TID) | ORAL | 3 refills | Status: DC

## 2018-06-12 MED ORDER — SULFAMETHOXAZOLE-TRIMETHOPRIM 800-160 MG PO TABS: 1.0000 | ORAL_TABLET | Freq: Two times a day (BID) | ORAL | 0 refills | Status: DC

## 2018-06-12 MED ORDER — FUROSEMIDE 40 MG PO TABS: 60.0000 mg | ORAL_TABLET | Freq: Two times a day (BID) | ORAL | 3 refills | Status: DC

## 2018-06-12 MED ORDER — ALBUTEROL SULFATE HFA 108 (90 BASE) MCG/ACT IN AERS: 2.0000 | INHALATION_SPRAY | RESPIRATORY_TRACT | 0 refills | Status: DC | PRN

## 2018-06-12 MED ORDER — MAGNESIUM OXIDE 400 MG PO TABS: 400.0000 mg | ORAL_TABLET | Freq: Two times a day (BID) | ORAL | 3 refills | Status: DC

## 2018-06-12 MED ORDER — CARVEDILOL 25 MG PO TABS: 25.0000 mg | ORAL_TABLET | Freq: Two times a day (BID) | ORAL | 3 refills | Status: DC

## 2018-06-12 NOTE — Progress Notes (Deleted)
Patient: [name] Male    DOB: 05/04/1966   52 y.o.   MRN: [id number] Visit Date: 06/12/2018  Today's Provider: Lead   Chief Complaint  Patient presents with  . Follow-up    Concerned about kidney and liver function   . Follow-up    Several knots around abdomen, not painful    Subjective:     [name] is a 52 y/o M with a history of DM, HTN, HLD, CKD, OSA,  NSVT, pulmonary HTN, HFPEF, and morbid obesity here for general health f/u.         No Known Allergies Previous Medications   ALBUTEROL (PROVENTIL HFA;VENTOLIN HFA) 108 (90 BASE) MCG/ACT INHALER    Inhale 2 puffs into the lungs every 4 (four) hours as needed for wheezing or shortness of breath.   ALLOPURINOL (ZYLOPRIM) 300 MG TABLET    Take 1 tablet (300 mg total) by mouth daily.   AMMONIUM LACTATE (AMLACTIN) 12 % CREAM    APPLY TOPICALLY AS NEEDED FOR DRY SKIN.   ASPIRIN EC 81 MG TABLET    Take 1 tablet (81 mg total) by mouth daily.   ATORVASTATIN (LIPITOR) 40 MG TABLET    Take 1 tablet (40 mg total) by mouth at bedtime.   CARVEDILOL (COREG) 25 MG TABLET    Take 1 tablet (25 mg total) by mouth every 12 (twelve) hours.   CHOLECALCIFEROL (D3-1000) 1000 UNITS TABLET    Take 4,000 Units by mouth daily.   CYCLOBENZAPRINE (FLEXERIL) 5 MG TABLET    Take 5 mg by mouth as needed.   EPLERENONE (INSPRA) 50 MG TABLET    Take 50 mg by mouth daily.   GABAPENTIN (NEURONTIN) 300 MG CAPSULE    Take 2 capsules (600 mg total) by mouth 3 (three) times daily.   HYDRALAZINE (APRESOLINE) 50 MG TABLET    Take 1 tablet (50 mg total) by mouth 3 (three) times daily.   INSULIN ASPART (NOVOLOG) 100 UNIT/ML INJECTION    Inject 20-35 Units into the skin 3 (three) times daily before meals.   INSULIN GLARGINE (LANTUS) 100 UNIT/ML INJECTION    Inject 0.65-0.8 mLs (65-80 Units total) into the skin at bedtime.   ISOSORBIDE DINITRATE (ISORDIL) 20 MG TABLET    Take 1 tablet (20 mg total) by mouth 3 (three) times daily.   MAGNESIUM OXIDE (MAG-OX) 400 MG TABLET    Take 400 mg by mouth 2 (two) times daily.   METOLAZONE (ZAROXOLYN) 2.5 MG TABLET    Take 1 tablet (2.5 mg total) by mouth as needed.   OMEPRAZOLE (PRILOSEC) 20 MG CAPSULE    Take 1 capsule (20 mg total) by mouth 2 (two) times daily before a meal.   TORSEMIDE (DEMADEX) 100 MG TABLET    Take 100 mg by mouth 2 (two) times daily.   TRIAMCINOLONE CREAM (KENALOG) 0.1 %    Apply 1 application topically 2 (two) times daily.   TRIAMCINOLONE OINTMENT (KENALOG) 0.5 %    Apply 1 application topically 2 (two) times daily.   VICTOZA 18 MG/3ML SOPN    Inject 0.6 mg into the skin daily.    Review of Systems  Social History   Tobacco Use  . Smoking status: Never Smoker  . Smokeless tobacco: Never Used  Substance Use Topics  . Alcohol use: Yes    Comment: very rarely   Objective:   BP 116/74   Pulse 83   Temp 98.2 F (36.8 C)   Ht 5\' 3"  (  1.6 m)   Wt (!) 308 lb 11.2 oz (140 kg)   BMI 54.68 kg/m   Physical Exam      Assessment & Plan:      1: Chronic heart failure with preserved ejection fraction- - NYHA class III - euvolemic today - weighing daily and says that he's gradually lost weight. Instructed to call for an overnight weight gain of >2 pounds or a weekly weight gain of >5 pounds - he says that his weight at his PCP's office on 02/06/18 was 307 pounds and today he is 302.4 pounds - not adding salt and has been trying to read food labels. Does rinse off canned foods. Reviewed the importance of closely following a 2000mg  sodium diet and written dietary information was given to him about this - wearing compression socks daily; encouraged him to elevate his legs when sitting for long periods of time - saw cardiology at [facility] Morton Stall) 09/26/17 & is waiting on a follow-up appointment - reports receiving his flu vaccine for this season - BNP from 01/09/18 62.0 - may need pulmonology referral due to pulmonary HTN  2: HTN- - BP looks good today - saw  PCP ([facility]) 02/06/18 - BMP from 01/14/18 reviewed and showed sodium 136, potassium 4.0 and GFR >60  3: Diabetes- - saw nephrology Radene Knee) November 2018 - A1c on 01/10/18 was 10.3% - has eye doctor appointment scheduled - fasting glucose at home this morning was 158  4: Obstructive sleep apnea- - wearing bipap at night - wearing oxygen at 3L around the clock and says that his breathing has improved since doing that        [facility]

## 2018-06-12 NOTE — Progress Notes (Signed)
Patient: [name] Male    DOB: October 30, 1966   52 y.o.   MRN: [id number] Visit Date: 06/12/2018  Today's Provider: Staci Acosta, NP   Chief Complaint  Patient presents with  . Follow-up    Concerned about kidney and liver function   . Follow-up    Several knots around abdomen, not painful    Subjective:    HPI  Pt states that he was in the hospital x 2 weeks in May due to PNA.  He had bilateral lung collapse and ventilator placement.   Pt states that he is gaining weight- 6 lbs recently.  Has not been to the Cardiologist recently.  He is not adding salt to his diet.  He was switched from Torsemide to Furosemide- not taking Zaroxolyn.    States that he has knots in his stomach (3 of them) since he was discharged form the hospital.  States that the knots are not painful.  Denies N/V/D, constipation.     Uses BiPAP at night, maintained on 3L [place] during the day.  Denies dyspnea.  States that he is using albuterol at least 2x daily.   Pt is off Victoza- he was taken off in the hospital.  Taking Humilin 50 units TID and Lantus 90 units BID.  Fasting CBGs running 120-130.  States that he is watching his carb intake.   Continue to have Psoriasis and uses clobetasol and triamcinolone with some relief.  States that the areas are itching and dry- with some scaling and peeling to the LLE.     No Known Allergies Previous Medications   ALBUTEROL (PROVENTIL HFA;VENTOLIN HFA) 108 (90 BASE) MCG/ACT INHALER    Inhale 2 puffs into the lungs every 4 (four) hours as needed for wheezing or shortness of breath.   ALLOPURINOL (ZYLOPRIM) 300 MG TABLET    Take 1 tablet (300 mg total) by mouth daily.   AMMONIUM LACTATE (AMLACTIN) 12 % CREAM    APPLY TOPICALLY AS NEEDED FOR DRY SKIN.   ASPIRIN EC 81 MG TABLET    Take 1 tablet (81 mg total) by mouth daily.   ATORVASTATIN (LIPITOR) 40 MG TABLET    Take 1 tablet (40 mg total) by mouth at bedtime.   CARVEDILOL (COREG) 25 MG TABLET     Take 1 tablet (25 mg total) by mouth every 12 (twelve) hours.   CHOLECALCIFEROL (D3-1000) 1000 UNITS TABLET    Take 4,000 Units by mouth daily.   CYCLOBENZAPRINE (FLEXERIL) 5 MG TABLET    Take 5 mg by mouth as needed.   EPLERENONE (INSPRA) 50 MG TABLET    Take 50 mg by mouth daily.   GABAPENTIN (NEURONTIN) 300 MG CAPSULE    Take 2 capsules (600 mg total) by mouth 3 (three) times daily.   HYDRALAZINE (APRESOLINE) 50 MG TABLET    Take 1 tablet (50 mg total) by mouth 3 (three) times daily.   INSULIN ASPART (NOVOLOG) 100 UNIT/ML INJECTION    Inject 20-35 Units into the skin 3 (three) times daily before meals.   INSULIN GLARGINE (LANTUS) 100 UNIT/ML INJECTION    Inject 0.65-0.8 mLs (65-80 Units total) into the skin at bedtime.   INSULIN REGULAR (NOVOLIN R,HUMULIN R) 100 UNITS/ML INJECTION    Inject 50 Units into the skin 3 (three) times daily before meals.   ISOSORBIDE DINITRATE (ISORDIL) 20 MG TABLET    Take 1 tablet (20 mg total) by mouth 3 (three) times daily.   MAGNESIUM OXIDE (MAG-OX) 400 MG TABLET  Take 400 mg by mouth 2 (two) times daily.   METOLAZONE (ZAROXOLYN) 2.5 MG TABLET    Take 1 tablet (2.5 mg total) by mouth as needed.   OMEPRAZOLE (PRILOSEC) 20 MG CAPSULE    Take 1 capsule (20 mg total) by mouth 2 (two) times daily before a meal.   TORSEMIDE (DEMADEX) 100 MG TABLET    Take 100 mg by mouth 2 (two) times daily.   TRIAMCINOLONE CREAM (KENALOG) 0.1 %    Apply 1 application topically 2 (two) times daily.   TRIAMCINOLONE OINTMENT (KENALOG) 0.5 %    Apply 1 application topically 2 (two) times daily.   VICTOZA 18 MG/3ML SOPN    Inject 0.6 mg into the skin daily.    Review of Systems  Cardiovascular: Negative for chest pain.  Skin: Positive for color change.    Social History   Tobacco Use  . Smoking status: Never Smoker  . Smokeless tobacco: Never Used  Substance Use Topics  . Alcohol use: Yes    Comment: very rarely   Objective:   BP 116/74   Pulse 83   Temp 98.2 F (36.8  C)   Ht 5\' 3"  (1.6 m)   Wt (!) 308 lb 11.2 oz (140 kg)   BMI 54.68 kg/m   Physical Exam  Constitutional: He is oriented to person, place, and time. He appears well-developed and well-nourished.  HENT:  Head: Normocephalic and atraumatic.  Cardiovascular: Normal rate, regular rhythm and normal heart sounds.  Pulmonary/Chest: Effort normal and breath sounds normal.  Oxygen on via [place]  Abdominal: Soft. Bowel sounds are normal.  Neurological: He is alert and oriented to person, place, and time.  Skin: Skin is warm. There is erythema.  Psoriasis covering skin. Clobatesol ointment applied. Redness and warmth to LLE, no drainage or open areas.         Assessment & Plan:     Routine labs ordered.   DM:  A1c today.  Encourage diabetic diet and exercise.  Continue current medication regimen. ? Insulin dose- will adjust pending A1c.  Discussed exercise regimen and to take it slow.  Endo clinic.   HLD: Lipid panel today.  Continue current regimen.  Encourage low cholesterol, low fat diet and exercise.   CHF:  Continue Lasix in place of Torsemide at this time, Take Zaroxolyn PRN with weight gain >3lbs in a day or increased fluid.  Take one tablet of Zaroxolyn tomorrow.  Weigh daily.  Make FU appointment with HF clinic.  He was no show last appointment due to no transportation.   Psoriasis:  Continue to apply creams as needed.  Keep clean and dry.  Take Bactrim BID x 10 days for MRSA coverage cellulitis.   HTN:  Controlled.  Goal BP <140/90.  Continue current medication regimen.  Encourage low salt diet and exercise.   OSA: Continue BiPAP and oxygen.  Continue Albuterol as needed.   Discharge summary reviewed. Med rec performed.   FU in 2 weeks.         Staci Acosta, NP   [facility]

## 2018-06-13 LAB — CBC
Hematocrit: 36.3 % — ABNORMAL LOW (ref 37.5–51.0)
Hemoglobin: 11.8 g/dL — ABNORMAL LOW (ref 13.0–17.7)
MCH: 26.9 pg (ref 26.6–33.0)
MCHC: 32.5 g/dL (ref 31.5–35.7)
MCV: 83 fL (ref 79–97)
Platelets: 306 10*3/uL (ref 150–450)
RBC: 4.38 x10E6/uL (ref 4.14–5.80)
RDW: 16 % — ABNORMAL HIGH (ref 12.3–15.4)
WBC: 12.3 10*3/uL — ABNORMAL HIGH (ref 3.4–10.8)

## 2018-06-13 LAB — COMPREHENSIVE METABOLIC PANEL
ALT: 44 IU/L (ref 0–44)
AST: 30 IU/L (ref 0–40)
Albumin/Globulin Ratio: 1 — ABNORMAL LOW (ref 1.2–2.2)
Albumin: 4 g/dL (ref 3.5–5.5)
Alkaline Phosphatase: 121 IU/L — ABNORMAL HIGH (ref 39–117)
BUN/Creatinine Ratio: 11 (ref 9–20)
BUN: 20 mg/dL (ref 6–24)
Bilirubin Total: 0.4 mg/dL (ref 0.0–1.2)
CO2: 29 mmol/L (ref 20–29)
Calcium: 9.7 mg/dL (ref 8.7–10.2)
Chloride: 96 mmol/L (ref 96–106)
Creatinine, Ser: 1.74 mg/dL — ABNORMAL HIGH (ref 0.76–1.27)
GFR calc Af Amer: 52 mL/min/{1.73_m2} — ABNORMAL LOW (ref >59)
GFR calc non Af Amer: 45 mL/min/{1.73_m2} — ABNORMAL LOW (ref >59)
Globulin, Total: 3.9 g/dL (ref 1.5–4.5)
Glucose: 155 mg/dL — ABNORMAL HIGH (ref 65–99)
Potassium: 4.6 mmol/L (ref 3.5–5.2)
Sodium: 140 mmol/L (ref 134–144)
Total Protein: 7.9 g/dL (ref 6.0–8.5)

## 2018-06-13 LAB — HEMOGLOBIN A1C
Est. average glucose Bld gHb Est-mCnc: 260 mg/dL
Hgb A1c MFr Bld: 10.7 % — ABNORMAL HIGH (ref 4.8–5.6)

## 2018-06-13 LAB — LIPID PANEL
Chol/HDL Ratio: 3.6 ratio (ref 0.0–5.0)
Cholesterol, Total: 126 mg/dL (ref 100–199)
HDL: 35 mg/dL — ABNORMAL LOW (ref >39)
LDL Calculated: 68 mg/dL (ref 0–99)
Triglycerides: 115 mg/dL (ref 0–149)
VLDL Cholesterol Cal: 23 mg/dL (ref 5–40)

## 2018-06-13 LAB — MAGNESIUM: Magnesium: 1.4 mg/dL — ABNORMAL LOW (ref 1.6–2.3)

## 2018-06-17 ENCOUNTER — Telehealth: Payer: Self-pay | Admitting: Licensed Clinical Social Worker

## 2018-06-17 ENCOUNTER — Other Ambulatory Visit: Payer: Self-pay

## 2018-06-17 DIAGNOSIS — I429 Cardiomyopathy, unspecified: Secondary | ICD-10-CM

## 2018-06-17 DIAGNOSIS — E1142 Type 2 diabetes mellitus with diabetic polyneuropathy: Secondary | ICD-10-CM

## 2018-06-17 DIAGNOSIS — E7800 Pure hypercholesterolemia, unspecified: Secondary | ICD-10-CM

## 2018-06-17 DIAGNOSIS — M1A09X0 Idiopathic chronic gout, multiple sites, without tophus (tophi): Secondary | ICD-10-CM

## 2018-06-17 MED ORDER — ATORVASTATIN CALCIUM 40 MG PO TABS: 40.0000 mg | ORAL_TABLET | Freq: Every day | ORAL | 3 refills | Status: DC

## 2018-06-17 MED ORDER — ALLOPURINOL 300 MG PO TABS: 300.0000 mg | ORAL_TABLET | Freq: Every day | ORAL | 4 refills | Status: DC

## 2018-06-17 MED ORDER — ALBUTEROL SULFATE HFA 108 (90 BASE) MCG/ACT IN AERS: 2.0000 | INHALATION_SPRAY | RESPIRATORY_TRACT | 0 refills | Status: DC | PRN

## 2018-06-17 MED ORDER — ISOSORBIDE DINITRATE 20 MG PO TABS: 20.0000 mg | ORAL_TABLET | Freq: Three times a day (TID) | ORAL | 2 refills | Status: DC

## 2018-06-17 MED ORDER — ASPIRIN EC 81 MG PO TBEC: 81.0000 mg | DELAYED_RELEASE_TABLET | Freq: Every day | ORAL | 0 refills | Status: DC

## 2018-06-17 MED ORDER — GABAPENTIN 300 MG PO CAPS: 600.0000 mg | ORAL_CAPSULE | Freq: Three times a day (TID) | ORAL | 3 refills | Status: DC

## 2018-06-17 MED ORDER — OMEPRAZOLE 20 MG PO CPDR: 20.0000 mg | DELAYED_RELEASE_CAPSULE | Freq: Two times a day (BID) | ORAL | 2 refills | Status: DC

## 2018-06-17 MED ORDER — CARVEDILOL 25 MG PO TABS: 25.0000 mg | ORAL_TABLET | Freq: Two times a day (BID) | ORAL | 3 refills | Status: DC

## 2018-06-17 NOTE — Telephone Encounter (Signed)
Clinician faxed a release of information to Cheri Kearns at [place] to discuss the patient.

## 2018-06-17 NOTE — Progress Notes (Unsigned)
  Clinic coordinator referred [name] to the Education officer, museum for helping the patient to understand his current situation.   Patient explained that he had [place] and [place] but received some misinformation prompting him to cancel his [employer]. He explains that because he purchased it on the market place that he is unable to to sign up for another plan until Open Enrollment starts again in the Fall. He explains that due to being in the hospital for two weeks that he missed his reverification and Medicaid called him while he was in the hospital to inform him that he would only have Medicaid for approximately one more week. He notes that he nearly lost his life while in the hospital at [facility]. He explains that he has disability but has difficulty with transportation. He signed a release of information for Medicaid and his worker was [employer]. He filled out a para transit application to assist with transportation.  Clinician explained that she would contact his Medicaid worker to try to make sense of the situation and see if there is any way to get him back on Medicaid with all of his chronic health conditions. She explained that she could assist him with transportation to his appointments and had him fill out a para transit application.

## 2018-06-18 ENCOUNTER — Other Ambulatory Visit: Payer: Self-pay

## 2018-06-18 ENCOUNTER — Telehealth: Payer: Self-pay

## 2018-06-18 DIAGNOSIS — E7800 Pure hypercholesterolemia, unspecified: Secondary | ICD-10-CM

## 2018-06-18 DIAGNOSIS — M1A09X0 Idiopathic chronic gout, multiple sites, without tophus (tophi): Secondary | ICD-10-CM

## 2018-06-18 DIAGNOSIS — E1142 Type 2 diabetes mellitus with diabetic polyneuropathy: Secondary | ICD-10-CM

## 2018-06-18 DIAGNOSIS — I429 Cardiomyopathy, unspecified: Secondary | ICD-10-CM

## 2018-06-18 MED ORDER — ISOSORBIDE DINITRATE 20 MG PO TABS: 20.0000 mg | ORAL_TABLET | Freq: Three times a day (TID) | ORAL | 2 refills | Status: DC

## 2018-06-18 MED ORDER — ALLOPURINOL 300 MG PO TABS: 300.0000 mg | ORAL_TABLET | Freq: Every day | ORAL | 4 refills | Status: DC

## 2018-06-18 MED ORDER — GABAPENTIN 300 MG PO CAPS: 600.0000 mg | ORAL_CAPSULE | Freq: Three times a day (TID) | ORAL | 3 refills | Status: DC

## 2018-06-18 MED ORDER — ALBUTEROL SULFATE HFA 108 (90 BASE) MCG/ACT IN AERS: 2.0000 | INHALATION_SPRAY | RESPIRATORY_TRACT | 0 refills | Status: DC | PRN

## 2018-06-18 MED ORDER — ASPIRIN EC 81 MG PO TBEC: 81.0000 mg | DELAYED_RELEASE_TABLET | Freq: Every day | ORAL | 0 refills | Status: DC

## 2018-06-18 MED ORDER — OMEPRAZOLE 20 MG PO CPDR: 20.0000 mg | DELAYED_RELEASE_CAPSULE | Freq: Two times a day (BID) | ORAL | 2 refills | Status: DC

## 2018-06-18 MED ORDER — CARVEDILOL 25 MG PO TABS: 25.0000 mg | ORAL_TABLET | Freq: Two times a day (BID) | ORAL | 3 refills | Status: DC

## 2018-06-18 MED ORDER — ATORVASTATIN CALCIUM 40 MG PO TABS: 40.0000 mg | ORAL_TABLET | Freq: Every day | ORAL | 3 refills | Status: DC

## 2018-06-18 NOTE — Telephone Encounter (Signed)
Pt scheduled on 7/5 at 140p with CHF clinic

## 2018-06-18 NOTE — Telephone Encounter (Signed)
Lm with CHF Clinic that pt needs an appt asap

## 2018-06-18 NOTE — Progress Notes (Signed)
refaxed meds to [facility]. Some of the previous rs'x printed out

## 2018-06-24 ENCOUNTER — Telehealth: Payer: Self-pay | Admitting: Licensed Clinical Social Worker

## 2018-06-24 ENCOUNTER — Ambulatory Visit: Payer: Self-pay | Admitting: Family Medicine

## 2018-06-24 ENCOUNTER — Ambulatory Visit: Payer: Self-pay

## 2018-06-24 DIAGNOSIS — N183 Chronic kidney disease, stage 3 unspecified: Secondary | ICD-10-CM

## 2018-06-24 DIAGNOSIS — L409 Psoriasis, unspecified: Secondary | ICD-10-CM

## 2018-06-24 DIAGNOSIS — E1142 Type 2 diabetes mellitus with diabetic polyneuropathy: Secondary | ICD-10-CM

## 2018-06-24 DIAGNOSIS — G4733 Obstructive sleep apnea (adult) (pediatric): Secondary | ICD-10-CM

## 2018-06-24 MED ORDER — DULAGLUTIDE 0.75 MG/0.5ML ~~LOC~~ SOAJ: SUBCUTANEOUS | 0 refills | Status: DC

## 2018-06-24 MED ORDER — TRIAMCINOLONE ACETONIDE 0.5 % EX OINT: 1.0000 "application " | TOPICAL_OINTMENT | Freq: Two times a day (BID) | CUTANEOUS | 1 refills | Status: DC

## 2018-06-24 MED ORDER — TRIAMCINOLONE ACETONIDE 0.1 % EX CREA: 1.0000 "application " | TOPICAL_CREAM | Freq: Two times a day (BID) | CUTANEOUS | 2 refills | Status: DC

## 2018-06-24 NOTE — Telephone Encounter (Signed)
Clinician reached out to [place] Medicaid worker Cheri Kearns regarding Mr. Milliron to discover the reason he lost his medicaid to determine if it was in error. She left a voicemail with call back information.

## 2018-06-24 NOTE — Assessment & Plan Note (Signed)
Wearing BiPAP every night; weight loss will help

## 2018-06-24 NOTE — Patient Instructions (Addendum)
Check out the information at familydoctor.org entitled "Nutrition for Weight Loss: What You Need to Know about Fad Diets" Try to lose between 1-2 pounds per week by taking in fewer calories and burning off more calories You can succeed by limiting portions, limiting foods dense in calories and fat, becoming more active, and drinking 8 glasses of water a day (64 ounces) Don't skip meals, especially breakfast, as skipping meals may alter your metabolism Do not use over-the-counter weight loss pills or gimmicks that claim rapid weight loss A healthy BMI (or body mass index) is between 18.5 and 24.9 You can calculate your ideal BMI at the [place] website ClubMonetize.fr   Obesity, Adult Obesity is the condition of having too much total body fat. Being overweight or obese means that your weight is greater than what is considered healthy for your body size. Obesity is determined by a measurement called BMI. BMI is an estimate of body fat and is calculated from height and weight. For adults, a BMI of 30 or higher is considered obese. Obesity can eventually lead to other health concerns and major illnesses, including:  Stroke.  Coronary artery disease (CAD).  Type 2 diabetes.  Some types of cancer, including cancers of the colon, breast, uterus, and gallbladder.  Osteoarthritis.  High blood pressure (hypertension).  High cholesterol.  Sleep apnea.  Gallbladder stones.  Infertility problems.  What are the causes? The main cause of obesity is taking in (consuming) more calories than your body uses for energy. Other factors that contribute to this condition may include:  Being born with genes that make you more likely to become obese.  Having a medical condition that causes obesity. These conditions include: ? Hypothyroidism. ? Polycystic ovarian syndrome (PCOS). ? Binge-eating disorder. ? Cushing syndrome.  Taking certain medicines,  such as steroids, antidepressants, and seizure medicines.  Not being physically active (sedentary lifestyle).  Living where there are limited places to exercise safely or buy healthy foods.  Not getting enough sleep.  What increases the risk? The following factors may increase your risk of this condition:  Having a family history of obesity.  Being a woman of African-American descent.  Being a man of Hispanic descent.  What are the signs or symptoms? Having excessive body fat is the main symptom of this condition. How is this diagnosed? This condition may be diagnosed based on:  Your symptoms.  Your medical history.  A physical exam. Your health care provider may measure: ? Your BMI. If you are an adult with a BMI between 25 and less than 30, you are considered overweight. If you are an adult with a BMI of 30 or higher, you are considered obese. ? The distances around your hips and your waist (circumferences). These may be compared to each other to help diagnose your condition. ? Your skinfold thickness. Your health care provider may gently pinch a fold of your skin and measure it.  How is this treated? Treatment for this condition often includes changing your lifestyle. Treatment may include some or all of the following:  Dietary changes. Work with your health care provider and a dietitian to set a weight-loss goal that is healthy and reasonable for you. Dietary changes may include eating: ? Smaller portions. A portion size is the amount of a particular food that is healthy for you to eat at one time. This varies from person to person. ? Low-calorie or low-fat options. ? More whole grains, fruits, and vegetables.  Regular physical activity. This may  include aerobic activity (cardio) and strength training.  Medicine to help you lose weight. Your health care provider may prescribe medicine if you are unable to lose 1 pound a week after 6 weeks of eating more healthily and  doing more physical activity.  Surgery. Surgical options may include gastric banding and gastric bypass. Surgery may be done if: ? Other treatments have not helped to improve your condition. ? You have a BMI of 40 or higher. ? You have life-threatening health problems related to obesity.  Follow these instructions at home:  Eating and drinking   Follow recommendations from your health care provider about what you eat and drink. Your health care provider may advise you to: ? Limit fast foods, sweets, and processed snack foods. ? Choose low-fat options, such as low-fat milk instead of whole milk. ? Eat 5 or more servings of fruits or vegetables every day. ? Eat at home more often. This gives you more control over what you eat. ? Choose healthy foods when you eat out. ? Learn what a healthy portion size is. ? Keep low-fat snacks on hand. ? Avoid sugary drinks, such as soda, fruit juice, iced tea sweetened with sugar, and flavored milk. ? Eat a healthy breakfast.  Drink enough water to keep your urine clear or pale yellow.  Do not go without eating for long periods of time (do not fast) or follow a fad diet. Fasting and fad diets can be unhealthy and even dangerous. Physical Activity  Exercise regularly, as told by your health care provider. Ask your health care provider what types of exercise are safe for you and how often you should exercise.  Warm up and stretch before being active.  Cool down and stretch after being active.  Rest between periods of activity. Lifestyle  Limit the time that you spend in front of your TV, computer, or video game system.  Find ways to reward yourself that do not involve food.  Limit alcohol intake to no more than 1 drink a day for nonpregnant women and 2 drinks a day for men. One drink equals 12 oz of beer, 5 oz of wine, or 1 oz of hard liquor. General instructions  Keep a weight loss journal to keep track of the food you eat and how much you  exercise you get.  Take over-the-counter and prescription medicines only as told by your health care provider.  Take vitamins and supplements only as told by your health care provider.  Consider joining a support group. Your health care provider may be able to recommend a support group.  Keep all follow-up visits as told by your health care provider. This is important. Contact a health care provider if:  You are unable to meet your weight loss goal after 6 weeks of dietary and lifestyle changes. This information is not intended to replace advice given to you by your health care provider. Make sure you discuss any questions you have with your health care provider. Document Released: 01/10/2005 Document Revised: 05/07/2016 Document Reviewed: 09/21/2015 Elsevier Interactive Patient Education  2018 [place].  Preventing Unhealthy [employer], Adult Staying at a healthy weight is important. When fat builds up in your body, you may become overweight or obese. These conditions put you at greater risk for developing certain health problems, such as heart disease, diabetes, sleeping problems, joint problems, and some cancers. Unhealthy weight gain is often the result of making unhealthy choices in what you eat. It is also a result of  not getting enough exercise. You can make changes to your lifestyle to prevent obesity and stay as healthy as possible. What nutrition changes can be made? To maintain a healthy weight and prevent obesity:  Eat only as much as your body needs. To do this: ? Pay attention to signs that you are hungry or full. Stop eating as soon as you feel full. ? If you feel hungry, try drinking water first. Drink enough water so your urine is clear or pale yellow. ? Eat smaller portions. ? Look at serving sizes on food labels. Most foods contain more than one serving per container. ? Eat the recommended amount of calories for your gender and activity level. While most active  people should eat around 2,000 calories per day, if you are trying to lose weight or are not very active, you main need to eat less calories. Talk to your health care provider or dietitian about how many calories you should eat each day.  Choose healthy foods, such as: ? Fruits and vegetables. Try to fill at least half of your plate at each meal with fruits and vegetables. ? Whole grains, such as whole wheat bread, brown rice, and quinoa. ? Lean meats, such as chicken or fish. ? Other healthy proteins, such as beans, eggs, or tofu. ? Healthy fats, such as nuts, seeds, fatty fish, and olive oil. ? Low-fat or fat-free dairy.  Check food labels and avoid food and drinks that: ? Are high in calories. ? Have added sugar. ? Are high in sodium. ? Have saturated fats or trans fats.  Limit how much you eat of the following foods: ? Prepackaged meals. ? Fast food. ? Fried foods. ? Processed meat, such as bacon, sausage, and deli meats. ? Fatty cuts of red meat and poultry with skin.  Cook foods in healthier ways, such as by baking, broiling, or grilling.  When grocery shopping, try to shop around the outside of the store. This helps you buy mostly fresh foods and avoid canned and prepackaged foods.  What lifestyle changes can be made?  Exercise at least 30 minutes 5 or more days each week. Exercising includes brisk walking, yard work, biking, running, swimming, and team sports like basketball and soccer. Ask your health care provider which exercises are safe for you.  Do not use any products that contain nicotine or tobacco, such as cigarettes and e-cigarettes. If you need help quitting, ask your health care provider.  Limit alcohol intake to no more than 1 drink a day for nonpregnant women and 2 drinks a day for men. One drink equals 12 oz of beer, 5 oz of wine, or 1 oz of hard liquor.  Try to get 7-9 hours of sleep each night. What other changes can be made?  Keep a food and activity  journal to keep track of: ? What you ate and how many calories you had. Remember to count sauces, dressings, and side dishes. ? Whether you were active, and what exercises you did. ? Your calorie, weight, and activity goals.  Check your weight regularly. Track any changes. If you notice you have gained weight, make changes to your diet or activity routine.  Avoid taking weight-loss medicines or supplements. Talk to your health care provider before starting any new medicine or supplement.  Talk to your health care provider before trying any new diet or exercise plan. Why are these changes important? Eating healthy, staying active, and having healthy habits not only help prevent obesity, they  also:  Help you to manage stress and emotions.  Help you to connect with friends and family.  Improve your self-esteem.  Improve your sleep.  Prevent long-term health problems.  What can happen if changes are not made? Being obese or overweight can cause you to develop joint or bone problems, which can make it hard for you to stay active or do activities you enjoy. Being obese or overweight also puts stress on your heart and lungs and can lead to health problems like diabetes, heart disease, and some cancers. Where to find more information: Talk with your health care provider or a dietitian about healthy eating and healthy lifestyle choices. You may also find other information through these resources:  U.S. Department of Agriculture MyPlate: FormerBoss.no  American Heart Association: www.heart.org  Centers for Disease Control and Prevention: http://www.wolf.info/  Summary  Staying at a healthy weight is important. It helps prevent certain diseases and health problems, such as heart disease, diabetes, joint problems, sleep disorders, and some cancers.  Being obese or overweight can cause you to develop joint or bone problems, which can make it hard for you to stay active or do activities you  enjoy.  You can prevent unhealthy weight gain by eating a healthy diet, exercising regularly, not smoking, limiting alcohol, and getting enough sleep.  Talk with your health care provider or a dietitian for guidance about healthy eating and healthy lifestyle choices. This information is not intended to replace advice given to you by your health care provider. Make sure you discuss any questions you have with your health care provider. Document Released: 12/04/2016 Document Revised: 01/09/2017 Document Reviewed: 01/09/2017 Elsevier Interactive Patient Education  [employer].

## 2018-06-24 NOTE — Assessment & Plan Note (Addendum)
Will be getting in to see the diabetes clinic; avoiding sodas and bread and drinking unsweetened tea and water

## 2018-06-24 NOTE — Assessment & Plan Note (Signed)
Encouraged weight loss; significant improvement in so many health problems

## 2018-06-24 NOTE — Progress Notes (Signed)
BP (!) 103/57   Pulse 86   Temp 98 F (36.7 C)   Wt (!) 300 lb 4.8 oz (136.2 kg)   BMI 53.20 kg/m    Subjective:    Patient ID: [name], male    DOB: 07-Jan-1966, 52 y.o.   MRN: [id number]  HPI: [name] is a 52 y.o. male  Chief Complaint  Patient presents with  . Follow-up    HPI Patient is here for f/u He was just here on 06/12/18 Hospitalized with pneumonia in May; his lungs collapsed and he was on a ventilator On BiPAP at night, 3 L/M oxygen via [place] during the day; has been on oxygen for a year before the pneumonia Diabetes mellitus; through endo clinic Heart failure; seeing heart failure clinic; was going to [facility]; went there for cardiology as well as pulmonology; he lost his Medicaid  Psoriasis Reviewed his previous labs with him today Low magnesium; last 1.4 Anemia, chronic Low HDL at 35 High A1c, 10.7 He has chronic kidney failure; creatinine 1.74  Depression screen [facility] 2/9 02/10/2018  Decreased Interest 0  Down, Depressed, Hopeless 0  PHQ - 2 Score 0    Relevant past medical, surgical, family and social history reviewed Past Medical History:  Diagnosis Date  . Bell's palsy   . CHF (congestive heart failure) ([place])   . Chronic kidney disease   . Diabetes mellitus without complication ([place])   . Hypertension   . NSVT (nonsustained ventricular tachycardia) ([place])   . Obstructive sleep apnea   . Pulmonary HTN ([place])    No past surgical history on file. Family History  Problem Relation Age of Onset  . Heart failure Mother   . Kidney failure Brother    Social History   Tobacco Use  . Smoking status: Never Smoker  . Smokeless tobacco: Never Used  Substance Use Topics  . Alcohol use: Yes    Comment: very rarely  . Drug use: No    Interim medical history since last visit reviewed. Allergies and medications reviewed  Review of Systems Per HPI unless specifically indicated above     Objective:     BP (!) 103/57   Pulse 86   Temp 98 F (36.7 C)   Wt (!) 300 lb 4.8 oz (136.2 kg)   BMI 53.20 kg/m   Wt Readings from Last 3 Encounters:  06/24/18 (!) 300 lb 4.8 oz (136.2 kg)  06/12/18 (!) 308 lb 11.2 oz (140 kg)  02/10/18 (!) 302 lb 4 oz (137.1 kg)    Physical Exam  Constitutional: He appears well-developed and well-nourished. No distress.  Morbid obesity  HENT:  Head: Normocephalic and atraumatic.  Eyes: EOM are normal. No scleral icterus.  Neck: No thyromegaly present.  Cardiovascular: Normal rate and regular rhythm.  Pulmonary/Chest: Effort normal and breath sounds normal.  Abdominal: Soft. Bowel sounds are normal. He exhibits no distension.  Musculoskeletal: He exhibits no edema.  Neurological: Coordination normal.  Skin: Skin is warm and dry. No pallor.  Psychiatric: He has a normal mood and affect. His behavior is normal. Judgment and thought content normal.      Assessment & Plan:   Problem List Items Addressed This Visit      Respiratory   Obstructive sleep apnea (Chronic)    Wearing BiPAP every night; weight loss will help        Endocrine   DM type 2 with diabetic peripheral neuropathy ([place])  Will be getting in to see the diabetes clinic; avoiding sodas and bread and drinking unsweetened tea and water      Relevant Medications   Dulaglutide (TRULICITY) 1.22 QM/2.5OI SOPN     Musculoskeletal and Integument   Psoriasis   Relevant Medications   triamcinolone ointment (KENALOG) 0.5 %     Genitourinary   CKD (chronic kidney disease) stage 3, GFR 30-59 ml/min (HCC)    Avoid NSAIDs; control glucose; hydrate well; avoid red meat        Other   Morbid obesity (HCC)    Encouraged weight loss; significant improvement in so many health problems      Relevant Medications   Dulaglutide (TRULICITY) 3.70 WU/8.8BV SOPN       Follow up plan: No follow-ups on file.  An after-visit summary was printed and given to the patient at [place].  Please  see the patient instructions which may contain other information and recommendations beyond what is mentioned above in the assessment and plan.  Meds ordered this encounter  Medications  . Dulaglutide (TRULICITY) 6.94 HW/3.8UE SOPN    Sig: Inject 0.5 mg subcutaneously in the skin once a week to help with diabetes and weight loss    Dispense:  12 pen    Refill:  0    Apply for patient assistance to get this free from the company  . triamcinolone ointment (KENALOG) 0.5 %    Sig: Apply 1 application topically 2 (two) times daily.    Dispense:  30 g    Refill:  1  . triamcinolone cream (KENALOG) 0.1 %    Sig: Apply 1 application topically 2 (two) times daily.    Dispense:  80 g    Refill:  2    No orders of the defined types were placed in this encounter.

## 2018-06-24 NOTE — Assessment & Plan Note (Signed)
Avoid NSAIDs; control glucose; hydrate well; avoid red meat

## 2018-06-30 ENCOUNTER — Ambulatory Visit: Payer: BLUE CROSS/BLUE SHIELD

## 2018-07-01 ENCOUNTER — Telehealth: Payer: Self-pay | Admitting: Licensed Clinical Social Worker

## 2018-07-01 NOTE — Telephone Encounter (Signed)
Clinician reached out to [name] after speaking with his case worker at [employer]. She explained to him that due to his income being over the limit restrictions, it's why he has a 6000 dollar deductible and due to that no exceptions can be made. She explained that she did send off his application for transportation and hopefully he will hear something within ten days. She explained that his worker advised him to contact the disability office to see if he would be eligible for Medicaid.

## 2018-07-02 ENCOUNTER — Ambulatory Visit: Payer: Self-pay | Admitting: Pharmacy Technician

## 2018-07-02 DIAGNOSIS — Z79899 Other long term (current) drug therapy: Secondary | ICD-10-CM

## 2018-07-03 NOTE — Progress Notes (Signed)
Met with patient completed financial assistance application for [place] due to recent ED visit.  Patient agreed to be responsible for gathering financial information and forwarding to appropriate department in [place].    Completed [facility] application and contract.  Patient agreed to all terms of the [facility] contract.    Patient approved to receive medication assistance at [facility] through 2019, as long as eligibility criteria is continued to be met.    Provided patient with community resource material based on his particular needs.    Trulicity Prescription Application completed with patient.  Forwarded to [facility] for signature.  Upon receipt of signed application from provider and proof of income from patient, Trulicity Prescription Application will be submitted to [employer].  Betty J. Kluttz Care Manager [facility]  

## 2018-07-16 ENCOUNTER — Telehealth: Payer: Self-pay | Admitting: Pharmacy Technician

## 2018-07-16 NOTE — Telephone Encounter (Signed)
Per patient-Will be eligible for Medicare December 2020.  [place] [facility]

## 2018-07-17 ENCOUNTER — Other Ambulatory Visit: Payer: Self-pay | Admitting: Adult Health Nurse Practitioner

## 2018-07-17 ENCOUNTER — Other Ambulatory Visit: Payer: Self-pay

## 2018-07-17 DIAGNOSIS — I270 Primary pulmonary hypertension: Secondary | ICD-10-CM

## 2018-07-17 MED ORDER — ISOSORBIDE MONONITRATE ER 60 MG PO TB24: 60.0000 mg | ORAL_TABLET | Freq: Every day | ORAL | 3 refills | Status: DC

## 2018-07-17 MED ORDER — ASPIRIN EC 81 MG PO TBEC: 81.0000 mg | DELAYED_RELEASE_TABLET | Freq: Every day | ORAL | 0 refills | Status: DC

## 2018-07-17 MED ORDER — TRIAMCINOLONE ACETONIDE 0.1 % EX CREA: 1.0000 "application " | TOPICAL_CREAM | Freq: Two times a day (BID) | CUTANEOUS | 2 refills | Status: DC

## 2018-07-18 ENCOUNTER — Telehealth: Payer: Self-pay | Admitting: Pharmacist

## 2018-07-18 NOTE — Telephone Encounter (Signed)
07/18/2018 80:69:99 PM - [place] to [place]  05/23/21 I have faxed [employer] application for Trulicity 7.73/7.5 Inject the contents of 1 pen under the skin once a week to help with diabetes and weight.AJ   07/18/2018 05:10:71 PM - applications to [facility] for provider  01/21/23 Sending application to [facility] for provider to sign: Humulin R 152ml Inject 50 units into the skin 3 times a day before meals, Lantus Solostar Inject 90 units into the skin two times a day, Proventil HFA 0.09mg  Inhale 2 puffs every 4 hours.[name]

## 2018-07-24 ENCOUNTER — Ambulatory Visit: Payer: Medicaid Other | Admitting: Podiatry

## 2018-07-29 ENCOUNTER — Ambulatory Visit: Payer: Self-pay | Admitting: Family Medicine

## 2018-07-29 VITALS — BP 119/70 | HR 73 | Temp 98.3°F | Ht 64.17 in | Wt 303.0 lb

## 2018-07-29 DIAGNOSIS — D649 Anemia, unspecified: Secondary | ICD-10-CM

## 2018-07-29 DIAGNOSIS — R635 Abnormal weight gain: Secondary | ICD-10-CM

## 2018-07-29 DIAGNOSIS — Z1211 Encounter for screening for malignant neoplasm of colon: Secondary | ICD-10-CM

## 2018-07-29 DIAGNOSIS — K921 Melena: Secondary | ICD-10-CM

## 2018-07-29 DIAGNOSIS — R5383 Other fatigue: Secondary | ICD-10-CM

## 2018-07-29 DIAGNOSIS — L03119 Cellulitis of unspecified part of limb: Secondary | ICD-10-CM

## 2018-07-29 DIAGNOSIS — L409 Psoriasis, unspecified: Secondary | ICD-10-CM

## 2018-07-29 DIAGNOSIS — E1142 Type 2 diabetes mellitus with diabetic polyneuropathy: Secondary | ICD-10-CM

## 2018-07-29 DIAGNOSIS — I10 Essential (primary) hypertension: Secondary | ICD-10-CM

## 2018-07-29 MED ORDER — SULFAMETHOXAZOLE-TRIMETHOPRIM 800-160 MG PO TABS: 1.0000 | ORAL_TABLET | Freq: Two times a day (BID) | ORAL | 0 refills | Status: DC

## 2018-07-29 NOTE — Assessment & Plan Note (Signed)
Last A1c reviewed; not time for another

## 2018-07-29 NOTE — Progress Notes (Signed)
BP 119/70   Pulse 73   Temp 98.3 F (36.8 C)   Ht 5' 4.17" (1.63 m)   Wt (!) 303 lb (137.4 kg)   BMI 51.73 kg/m    Subjective:    Patient ID: [name], male    DOB: September 28, 1966, 52 y.o.   MRN: [id number]  HPI: [name] is a 53 y.o. male  Chief Complaint  Patient presents with  . Follow-up  . Leg Pain   Patient is here for f/u  He is having leg pain Thinks infected; no fevers or night sweats Wearing compression stockings He has psoriasis  Tired a lot; can sit during the day and fall asleep; BiPap use, taking medicine No hx of anemia No fam hx of thyroid trouble No weight gain; no constipation; regular bowels, they are moving  Reviewed last labs; he knows that his kidneys are not working very well, "borderline" A1c has been high, sugar most recently 135  Has not had colonoscopy; just turned 6; did see a little bit of blood in the stool; that was one week ago; after bowels irritated after eating [place]  HPI  Depression screen [facility] 2/9 02/10/2018  Decreased Interest 0  Down, Depressed, Hopeless 0  PHQ - 2 Score 0    Relevant past medical, surgical, family and social history reviewed Past Medical History:  Diagnosis Date  . Bell's palsy   . CHF (congestive heart failure) ([place])   . Chronic kidney disease   . Diabetes mellitus without complication ([place])   . Hypertension   . NSVT (nonsustained ventricular tachycardia) ([place])   . Obstructive sleep apnea   . Pulmonary HTN ([place])    No past surgical history on file. Family History  Problem Relation Age of Onset  . Heart failure Mother   . Kidney failure Brother    Social History   Tobacco Use  . Smoking status: Never Smoker  . Smokeless tobacco: Never Used  Substance Use Topics  . Alcohol use: Yes    Comment: very rarely  . Drug use: No    Interim medical history since last visit reviewed. Allergies and medications reviewed  Review of Systems Per HPI unless specifically indicated  above     Objective:    BP 119/70   Pulse 73   Temp 98.3 F (36.8 C)   Ht 5' 4.17" (1.63 m)   Wt (!) 303 lb (137.4 kg)   BMI 51.73 kg/m   Wt Readings from Last 3 Encounters:  07/29/18 (!) 303 lb (137.4 kg)  06/24/18 (!) 300 lb 4.8 oz (136.2 kg)  06/12/18 (!) 308 lb 11.2 oz (140 kg)    Physical Exam  Constitutional: He appears well-developed and well-nourished. No distress.  Morbidly obese  Eyes: No scleral icterus.  Cardiovascular: Normal rate and regular rhythm.  Pulmonary/Chest: Effort normal and breath sounds normal.  Neurological: He is alert.  Skin: Rash (diffuse confluent and guttate plaques and papules of scale, erythema; skin on the legs has areas of erythema adjacent to scabs, no oozing, but tender over scabs and excoriations, particularly over distal posterior right calf) noted. No pallor.  Psychiatric: He has a normal mood and affect.       Assessment & Plan:   Problem List Items Addressed This Visit      Cardiovascular and Mediastinum   Essential hypertension    Controlled today        Endocrine   DM type 2 with diabetic peripheral neuropathy ([place])  Last A1c reviewed; not time for another        Musculoskeletal and Integument   Psoriasis - Primary    Refer to dermatologist; I believe this is contributing to the skin breakdown leading to cellulitis      Relevant Orders   Ambulatory referral to Dermatology   CBC w/Diff   TSH     Other   Morbid obesity ([place])    Encouragement given to lose weight       Other Visit Diagnoses    Anemia, unspecified type       noted on previous labs; refer for colonoscopy; check CBC, ferritin, iron   Relevant Orders   Ferritin   Iron   CBC w/Diff   TSH   Weight gain       check TSH; he does not appear fluid overloaded   Relevant Orders   CBC w/Diff   TSH   Colon cancer screening       Relevant Orders   Ambulatory referral to Gastroenterology   CBC w/Diff   TSH   Other fatigue       Relevant  Orders   CBC w/Diff   TSH   Blood in stool       refer to GI for colonoscopy   Relevant Orders   Ambulatory referral to Gastroenterology   CBC w/Diff   TSH   Cellulitis of lower leg       patient tolerated TMP/SMX in June; will use again to cover possible MRSA cellulitis; referral to derm as well       Follow up plan: Return in about 1 month (around 08/29/2018).  An after-visit summary was printed and given to the patient at [facility].  Please see the patient instructions which may contain other information and recommendations beyond what is mentioned above in the assessment and plan.  Meds ordered this encounter  Medications  . sulfamethoxazole-trimethoprim (BACTRIM DS,SEPTRA DS) 800-160 MG tablet    Sig: Take 1 tablet by mouth 2 (two) times daily.    Dispense:  14 tablet    Refill:  0    Orders Placed This Encounter  Procedures  . Ferritin  . Iron  . CBC w/Diff  . TSH  . Ambulatory referral to Dermatology  . Ambulatory referral to Gastroenterology

## 2018-07-29 NOTE — Assessment & Plan Note (Signed)
Controlled today 

## 2018-07-29 NOTE — Assessment & Plan Note (Signed)
Encouragement given to lose weight

## 2018-07-29 NOTE — Assessment & Plan Note (Signed)
Refer to dermatologist; I believe this is contributing to the skin breakdown leading to cellulitis

## 2018-07-29 NOTE — Patient Instructions (Addendum)
I sent the antibiotics to the pharmacy We'll have you see the gastroenterologist and the dermatologist Keep wearing the compression stockings Please do eat yogurt or kimchi or take a probiotic daily for the next month We want to replace the healthy germs in the gut If you notice foul, watery diarrhea in the next two months, schedule an appointment RIGHT AWAY or go to an urgent care or the emergency room if a holiday or over a weekend If you need something for aches or pains, try to use Tylenol (acetaminophen) instead of non-steroidals (which include Aleve, ibuprofen, Advil, Motrin, and naproxen); non-steroidals can cause long-term kidney damage Try turmeric as a natural anti-inflammatory (for pain and arthritis). It comes in capsules where you buy aspirin and fish oil, but also as a spice where you buy pepper and garlic powder.

## 2018-07-30 LAB — CBC WITH DIFFERENTIAL/PLATELET
Basophils Absolute: 0 10*3/uL (ref 0.0–0.2)
Basos: 0 %
EOS (ABSOLUTE): 0.3 10*3/uL (ref 0.0–0.4)
Eos: 3 %
Hematocrit: 34.3 % — ABNORMAL LOW (ref 37.5–51.0)
Hemoglobin: 10.5 g/dL — ABNORMAL LOW (ref 13.0–17.7)
Immature Grans (Abs): 0 10*3/uL (ref 0.0–0.1)
Immature Granulocytes: 0 %
Lymphocytes Absolute: 2.1 10*3/uL (ref 0.7–3.1)
Lymphs: 20 %
MCH: 26.4 pg — ABNORMAL LOW (ref 26.6–33.0)
MCHC: 30.6 g/dL — ABNORMAL LOW (ref 31.5–35.7)
MCV: 86 fL (ref 79–97)
Monocytes Absolute: 0.7 10*3/uL (ref 0.1–0.9)
Monocytes: 7 %
Neutrophils Absolute: 7.1 10*3/uL — ABNORMAL HIGH (ref 1.4–7.0)
Neutrophils: 70 %
Platelets: 301 10*3/uL (ref 150–450)
RBC: 3.97 x10E6/uL — ABNORMAL LOW (ref 4.14–5.80)
RDW: 16.7 % — ABNORMAL HIGH (ref 12.3–15.4)
WBC: 10.1 10*3/uL (ref 3.4–10.8)

## 2018-07-30 LAB — FERRITIN: Ferritin: 82 ng/mL (ref 30–400)

## 2018-07-30 LAB — TSH: TSH: 2.49 u[IU]/mL (ref 0.450–4.500)

## 2018-07-30 LAB — IRON: Iron: 41 ug/dL (ref 38–169)

## 2018-08-05 ENCOUNTER — Ambulatory Visit: Payer: Self-pay

## 2018-08-05 VITALS — BP 141/84 | HR 84 | Temp 98.0°F | Ht 63.0 in | Wt 307.0 lb

## 2018-08-05 DIAGNOSIS — E1142 Type 2 diabetes mellitus with diabetic polyneuropathy: Secondary | ICD-10-CM

## 2018-08-05 NOTE — Progress Notes (Signed)
[facility] - Preliminary Visit    Patient ID: AN SCHNABEL, male   DOB: 1966/04/23, 52 y.o.   MRN: [id number] Assessment:  [name] is a 52 y.o. male who is seen for the first time at [facility] endocrinology with a history of T2DM with peripheral neuropathy, stage 3 CKD, CAD, and diastolic heart failure. Seen at the request of Doles-Johnson, Teah, NP.  Plan:     Pt encouraged to continue Trulicity, Lantus, and Novolin as prescribed. Will f/u about nausea with Trulicity. If nausea abates, will increase dose to 1.5.  Pt told to bring glucometer to next visit. Discordant measures between reported blood sugar (130s-160s) and A1c (10.7%). Will f/u.  Noted that pt is not taking ACE inhibitor or metformin. Will query PCP to determine why, as no allergies reported.    Subjective:  HPI: XX year history of T2DM. Just started Trulicity (took first shot last week). Taking Lantus 2x daily (90 units in morning and 90 at night). Novolin (50 units 3x daily).   After first shot last Thursday morning, having nausea and decreased appetite. Threw up one morning.  blood sugar: 2x daily - 130-150 in the morning, 150-160 in the evening  Diet - breakfast = 2 boiled eggs, oatmeal, [place] sausage lunch = brown rice, meat dinner = frozen veggies, meat drink = bottled water  Trying hard to lose weight, lost 5 lbs recently but gained it back.  Last HbA1C 10.7% (06/12/2018)  Review of Systems  Denies polyuria, slight polydipsia Positive numbness in hands + feet Vision declining, visits [facility] (last 6 months ago)  JAKOBE BLAU  has a past medical history of Bell's palsy, CHF (congestive heart failure) ([place]), Chronic kidney disease, Diabetes mellitus without complication ([place]), Hypertension, NSVT (nonsustained ventricular tachycardia) ([place]), Obstructive sleep apnea, and Pulmonary HTN ([place]).  Family History, Social History, current Medications and allergies reviewed and  updated in Epic.   Objective:    Blood pressure (!) 141/84, pulse 84, temperature 98 F (36.7 C), temperature source Oral, height 5\' 3"  (1.6 m), weight (!) 307 lb (139.3 kg).  Physical Exam  Heart and lungs sounds normal (difficult to hear due to body habitus) Sensation 5/5 bilaterally in feet  Current Outpatient Medications on File Prior to Visit  Medication Sig Dispense Refill  . albuterol (PROVENTIL HFA;VENTOLIN HFA) 108 (90 Base) MCG/ACT inhaler Inhale 2 puffs into the lungs every 4 (four) hours as needed for wheezing or shortness of breath. 6.7 g 0  . allopurinol (ZYLOPRIM) 300 MG tablet Take 1 tablet (300 mg total) by mouth daily. 90 tablet 4  . aspirin EC 81 MG tablet Take 1 tablet (81 mg total) by mouth daily. 30 tablet 0  . atorvastatin (LIPITOR) 40 MG tablet Take 1 tablet (40 mg total) by mouth at bedtime. 90 tablet 3  . carvedilol (COREG) 25 MG tablet Take 1 tablet (25 mg total) by mouth every 12 (twelve) hours. 180 tablet 3  . Cholecalciferol (D3-1000) 1000 units tablet Take 4,000 Units by mouth daily.    . Dulaglutide (TRULICITY) 0.07 MA/2.6JF SOPN Inject 0.5 mg subcutaneously in the skin once a week to help with diabetes and weight loss 12 pen 0  . furosemide (LASIX) 40 MG tablet Take 1.5 tablets (60 mg total) by mouth 2 (two) times daily. 90 tablet 3  . gabapentin (NEURONTIN) 300 MG capsule Take 2 capsules (600 mg total) by mouth 3 (three) times daily. 540 capsule 3  . insulin glargine (  LANTUS) 100 UNIT/ML injection Inject 0.9 mLs (90 Units total) into the skin 2 (two) times daily. 10 mL 10  . insulin regular (NOVOLIN R,HUMULIN R) 100 units/mL injection Inject 0.5 mLs (50 Units total) into the skin 3 (three) times daily before meals. 10 mL 10  . isosorbide mononitrate (IMDUR) 60 MG 24 hr tablet Take 1 tablet (60 mg total) by mouth daily. 30 tablet 3  . magnesium oxide (MAG-OX) 400 MG tablet Take 1 tablet (400 mg total) by mouth 2 (two) times daily. 60 tablet 3  . metolazone  (ZAROXOLYN) 2.5 MG tablet Take 1 tablet (2.5 mg total) by mouth as needed. 20 tablet 2  . omeprazole (PRILOSEC) 20 MG capsule Take 1 capsule (20 mg total) by mouth 2 (two) times daily before a meal. 180 capsule 2  . sulfamethoxazole-trimethoprim (BACTRIM DS,SEPTRA DS) 800-160 MG tablet Take 1 tablet by mouth 2 (two) times daily. 14 tablet 0  . triamcinolone cream (KENALOG) 0.1 % Apply 1 application topically 2 (two) times daily. 454 g 2   No current facility-administered medications on file prior to visit.    Data : I have personally reviewed pertinent labs and imaging studies, if indicated,  with the patient in clinic today.   Lab Orders  No laboratory test(s) ordered today    HC Readings from Last 3 Encounters:  No data found for [facility]    Wt Readings from Last 3 Encounters:  08/05/18 (!) 307 lb (139.3 kg)  07/29/18 (!) 303 lb (137.4 kg)  06/24/18 (!) 300 lb 4.8 oz (136.2 kg)

## 2018-08-08 ENCOUNTER — Telehealth: Payer: Self-pay | Admitting: Pharmacist

## 2018-08-08 NOTE — Telephone Encounter (Signed)
08/08/2018 12:10:26 PM - Lantus Solostar  08/08/18 Faxed Sanofi application for Lantus Solostar Inject 90 units two times a day # 10.AJ   08/08/2018 12:09:39 PM - Humulin R Vial  1/66/19 Faxed Lilly application for Humulin R Vial Inject 50 units into the skin 3 times a day before meals #18.AJ   08/08/2018 12:08:18 PM - Proventil HFA  08/08/18 Mailing Merck application for Proventil HFA 0.09mg  Inhale 2 puffs every 4 hours.[name]

## 2018-08-13 ENCOUNTER — Other Ambulatory Visit: Payer: Self-pay | Admitting: Adult Health Nurse Practitioner

## 2018-08-13 DIAGNOSIS — I270 Primary pulmonary hypertension: Secondary | ICD-10-CM

## 2018-08-25 ENCOUNTER — Other Ambulatory Visit: Payer: Self-pay | Admitting: Adult Health Nurse Practitioner

## 2018-08-26 ENCOUNTER — Ambulatory Visit: Payer: Self-pay | Admitting: Family Medicine

## 2018-08-26 VITALS — BP 137/86 | HR 85 | Temp 98.1°F | Wt 301.3 lb

## 2018-08-26 DIAGNOSIS — E1142 Type 2 diabetes mellitus with diabetic polyneuropathy: Secondary | ICD-10-CM

## 2018-08-26 DIAGNOSIS — I5032 Chronic diastolic (congestive) heart failure: Secondary | ICD-10-CM

## 2018-08-26 DIAGNOSIS — L409 Psoriasis, unspecified: Secondary | ICD-10-CM

## 2018-08-26 DIAGNOSIS — Z09 Encounter for follow-up examination after completed treatment for conditions other than malignant neoplasm: Secondary | ICD-10-CM

## 2018-08-26 DIAGNOSIS — Z794 Long term (current) use of insulin: Secondary | ICD-10-CM

## 2018-08-26 DIAGNOSIS — H539 Unspecified visual disturbance: Secondary | ICD-10-CM

## 2018-08-26 DIAGNOSIS — R0602 Shortness of breath: Secondary | ICD-10-CM

## 2018-08-26 DIAGNOSIS — E1165 Type 2 diabetes mellitus with hyperglycemia: Secondary | ICD-10-CM

## 2018-08-26 MED ORDER — ALBUTEROL SULFATE (2.5 MG/3ML) 0.083% IN NEBU: 2.5000 mg | INHALATION_SOLUTION | Freq: Four times a day (QID) | RESPIRATORY_TRACT | 12 refills | Status: DC | PRN

## 2018-08-26 MED ORDER — INSULIN REGULAR HUMAN 100 UNIT/ML IJ SOLN: INTRAMUSCULAR | 10 refills | Status: DC

## 2018-08-26 MED ORDER — TRIAMCINOLONE ACETONIDE 0.5 % EX OINT: 1.0000 "application " | TOPICAL_OINTMENT | Freq: Two times a day (BID) | CUTANEOUS | 3 refills | Status: DC

## 2018-08-26 MED ORDER — INSULIN GLARGINE 100 UNIT/ML ~~LOC~~ SOLN: 90.0000 [IU] | Freq: Two times a day (BID) | SUBCUTANEOUS | 10 refills | Status: DC

## 2018-08-26 MED ORDER — TRIAMCINOLONE ACETONIDE 0.1 % EX CREA: 1.0000 "application " | TOPICAL_CREAM | Freq: Two times a day (BID) | CUTANEOUS | 3 refills | Status: DC

## 2018-08-26 NOTE — Progress Notes (Signed)
Follow Up  Subjective:    Patient ID: [name], male    DOB: 11-28-1966, 52 y.o.   MRN: [id number]   Chief Complaint  Patient presents with  . Follow-up    breathing is "not too good"     HPI  [name] is a 51 year old male with a past medical history of Pulmonary Hypertension, Sleep Apnea, NSVT, Hypertension, Psoriasis, Diabetes, CKD, CHF, and Bells Palsy. He is here today for follow up.   Current Status: Since her last office visit, she is doing well with no complaints.   His major concern is Dyspnea with mild exertion. He states that it is worsening for the past few weeks. He is currently on 3 liters continuous oxygen. He uses BPAP every night.   He has an appointment at Dermatology for follow up of Psoriasis. He also reports mild fatigue today.   He denies fevers, chills,  recent infections, weight loss, and night sweats. He has not had any headaches, visual changes, dizziness, and falls. No chest pain, heart palpitations, cough and shortness of breath reported. No reports of GI problems such as nausea, vomiting, diarrhea, and constipation. He has no reports of blood in stools, dysuria and hematuria. No depression or anxiety reported. He denies pain today.   Past Medical History:  Diagnosis Date  . Bell's palsy   . CHF (congestive heart failure) ([place])   . Chronic kidney disease   . Diabetes mellitus without complication ([place])   . Hypertension   . NSVT (nonsustained ventricular tachycardia) ([place])   . Obstructive sleep apnea   . Psoriasis   . Pulmonary HTN ([place])     Family History  Problem Relation Age of Onset  . Heart failure Mother   . Kidney failure Brother     Social History   Socioeconomic History  . Marital status: Single    Spouse name: Not on file  . Number of children: Not on file  . Years of education: Not on file  . Highest education level: Not on file  Occupational History  . Not on file  Social Needs  . Financial resource strain:  Somewhat hard  . Food insecurity:    Worry: Often true    Inability: Often true  . Transportation needs:    Medical: Yes    Non-medical: Yes  Tobacco Use  . Smoking status: Never Smoker  . Smokeless tobacco: Never Used  Substance and Sexual Activity  . Alcohol use: Yes    Alcohol/week: 2.0 standard drinks    Types: 1 Shots of liquor, 1 Cans of beer per week    Comment: very rarely 1-2 times per month  . Drug use: No  . Sexual activity: Not on file  Lifestyle  . Physical activity:    Days per week: 0 days    Minutes per session: 0 min  . Stress: Rather much  Relationships  . Social connections:    Talks on phone: Not on file    Gets together: Not on file    Attends religious service: Not on file    Active member of club or organization: Not on file    Attends meetings of clubs or organizations: Not on file    Relationship status: Not on file  . Intimate partner violence:    Fear of current or ex partner: Not on file    Emotionally abused: Not on file    Physically abused: Not on file    Forced sexual activity: Not  on file  Other Topics Concern  . Not on file  Social History Narrative  . Not on file    History reviewed. No pertinent surgical history.  Immunization History  Administered Date(s) Administered  . Influenza,inj,Quad PF,6+ Mos 01/11/2018  . Pneumococcal Polysaccharide-23 12/26/2016   Current Meds  Medication Sig  . albuterol (PROVENTIL HFA;VENTOLIN HFA) 108 (90 Base) MCG/ACT inhaler Inhale 2 puffs into the lungs every 4 (four) hours as needed for wheezing or shortness of breath.  . allopurinol (ZYLOPRIM) 300 MG tablet Take 1 tablet (300 mg total) by mouth daily.  [name] Kitchen aspirin EC 81 MG tablet TAKE ONE TABLET BY MOUTH EVERY DAY  . atorvastatin (LIPITOR) 40 MG tablet Take 1 tablet (40 mg total) by mouth at bedtime.  . carvedilol (COREG) 25 MG tablet Take 1 tablet (25 mg total) by mouth every 12 (twelve) hours.  . Cholecalciferol (D3-1000) 1000 units tablet  Take 4,000 Units by mouth daily.  . Dulaglutide (TRULICITY) 4.70 JG/2.8ZM SOPN Inject 0.5 mg subcutaneously in the skin once a week to help with diabetes and weight loss  . furosemide (LASIX) 40 MG tablet Take 1.5 tablets (60 mg total) by mouth 2 (two) times daily.  [name] Kitchen gabapentin (NEURONTIN) 300 MG capsule Take 2 capsules (600 mg total) by mouth 3 (three) times daily.  . insulin glargine (LANTUS) 100 UNIT/ML injection Inject 0.9 mLs (90 Units total) into the skin 2 (two) times daily.  . insulin regular (HUMULIN R) 100 units/mL injection INKECT 50 UNITS INTO THE SKIN 3 TIMES A DAY BEFORE MEALS  . isosorbide mononitrate (IMDUR) 60 MG 24 hr tablet Take 1 tablet (60 mg total) by mouth daily.  . magnesium oxide (MAG-OX) 400 MG tablet Take 1 tablet (400 mg total) by mouth 2 (two) times daily.  . metolazone (ZAROXOLYN) 2.5 MG tablet Take 1 tablet (2.5 mg total) by mouth as needed.  [name] Kitchen omeprazole (PRILOSEC) 20 MG capsule Take 1 capsule (20 mg total) by mouth 2 (two) times daily before a meal.  . sulfamethoxazole-trimethoprim (BACTRIM DS,SEPTRA DS) 800-160 MG tablet Take 1 tablet by mouth 2 (two) times daily.  [name] Kitchen triamcinolone cream (KENALOG) 0.1 % Apply 1 application topically 2 (two) times daily.  . [DISCONTINUED] HUMULIN R 100 UNIT/ML injection INKECT 50 UNITS INTO THE SKIN 3 TIMES A DAY BEFORE MEALS  . [DISCONTINUED] insulin glargine (LANTUS) 100 UNIT/ML injection Inject 0.9 mLs (90 Units total) into the skin 2 (two) times daily.  . [DISCONTINUED] triamcinolone cream (KENALOG) 0.1 % Apply 1 application topically 2 (two) times daily.    Allergies  Allergen Reactions  . Shellfish Allergy Anaphylaxis    Face and throat swelling, difficulty breathing Allergy can be triggered by touching (contact)    BP 137/86 (BP Location: Left Arm)   Pulse 85   Temp 98.1 F (36.7 C)   Wt (!) 301 lb 4.8 oz (136.7 kg)   BMI 53.37 kg/m   Review of Systems  Constitutional: Positive for fatigue.  HENT: Negative.    Eyes: Negative.   Respiratory: Negative.   Cardiovascular: Negative.   Gastrointestinal: Negative.   Endocrine: Negative.   Genitourinary: Negative.   Musculoskeletal: Negative.   Skin: Negative.   Allergic/Immunologic: Negative.   Neurological: Negative.   Hematological: Negative.   Psychiatric/Behavioral: Negative.     Objective:   Physical Exam  Constitutional: He is oriented to person, place, and time. He appears well-developed and well-nourished.  HENT:  Head: Normocephalic and atraumatic.  Right Ear: External ear normal.  Left  Ear: External ear normal.  Nose: Nose normal.  Mouth/Throat: Oropharynx is clear and moist.  Eyes: Pupils are equal, round, and reactive to light. Conjunctivae and EOM are normal.  Neck: Normal range of motion. Neck supple.  Cardiovascular: Normal rate, regular rhythm, normal heart sounds and intact distal pulses.  Pulmonary/Chest: Effort normal and breath sounds normal.  Abdominal: Soft. Bowel sounds are normal.  Musculoskeletal: Normal range of motion.  Neurological: He is alert and oriented to person, place, and time.  Skin: Skin is warm and dry. Capillary refill takes less than 2 seconds.  Psoriasis over entire body.   Psychiatric: He has a normal mood and affect. His behavior is normal. Judgment and thought content normal.  Nursing note and vitals reviewed.  Assessment & Plan:   1. Chronic diastolic heart failure (HCC) No signs of distress. - albuterol (PROVENTIL) (2.5 MG/3ML) 0.083% nebulizer solution; Take 3 mLs (2.5 mg total) by nebulization every 6 (six) hours as needed for wheezing or shortness of breath.  Dispense: 75 mL; Refill: 12 - Ambulatory referral to Pulmonology  2. Diabetes mellitus without complication (HCC) -insulin regular (HUMULIN R) 100 units/mL injection; INKECT 50 UNITS INTO THE SKIN 3 TIMES A DAY BEFORE MEALS  Dispense: 40 mL; Refill: 10 - insulin glargine (LANTUS) 100 UNIT/ML injection; Inject 0.9 mLs (90 Units  total) into the skin 2 (two) times daily.  Dispense: 10 mL; Refill: 10  3. DM type 2 with diabetic peripheral neuropathy (HCC) Hgb A1c increased at 10.7 on 06/12/2018, from 10.3 on 01/10/2018. Patient refuses assessment of A1c today.We will re-assess at next office visit. He will continue to decrease foods/beverages high in sugars and carbs and follow Heart Healthy or DASH diet. Increase physical activity to at least 30 minutes cardio exercise daily.   4. Morbid obesity- BMI >50-59 in adult- ([place]) BMI is improved at 53.37 today, from 54.38 on 08/05/2018. Goal BMI is <25. Encouraged efforts to reduce weight include engaging in physical activity as tolerated with goal of 150 minutes per week. Improve dietary choices and eat a meal regimen consistent with a Mediterranean or DASH diet. Reduce simple carbohydrates. Do not skip meals and eat healthy snacks throughout the day to avoid over-eating at dinner. Set a goal weight loss that is achievable for you.  5. Psoriasis Refills: - triamcinolone ointment (KENALOG) 0.5 %; Apply 1 application topically 2 (two) times daily.  Dispense: 30 g; Refill: 3 - triamcinolone cream (KENALOG) 0.1 %; Apply 1 application topically 2 (two) times daily.  Dispense: 454 g; Refill: 3  6. Shortness of breath Stable. No distress noted today.  - albuterol (PROVENTIL) (2.5 MG/3ML) 0.083% nebulizer solution; Take 3 mLs (2.5 mg total) by nebulizaton every 6 (six) hours as needed for wheezing or shortness of breath.  Dispense: 75 mL; Refill: 12 - Ambulatory referral to Pulmonology  7. Visual disturbance He will plan to schedule appointment with Optometry at our clinic.   8. Follow up He will follow up in 2 months.   Meds ordered this encounter  Medications  . albuterol (PROVENTIL) (2.5 MG/3ML) 0.083% nebulizer solution    Sig: Take 3 mLs (2.5 mg total) by nebulization every 6 (six) hours as needed for wheezing or shortness of breath.    Dispense:  75 mL    Refill:  12  .  triamcinolone ointment (KENALOG) 0.5 %    Sig: Apply 1 application topically 2 (two) times daily.    Dispense:  30 g    Refill:  3  .  insulin regular (HUMULIN R) 100 units/mL injection    Sig: INKECT 50 UNITS INTO THE SKIN 3 TIMES A DAY BEFORE MEALS    Dispense:  40 mL    Refill:  10  . insulin glargine (LANTUS) 100 UNIT/ML injection    Sig: Inject 0.9 mLs (90 Units total) into the skin 2 (two) times daily.    Dispense:  10 mL    Refill:  10  . triamcinolone cream (KENALOG) 0.1 %    Sig: Apply 1 application topically 2 (two) times daily.    Dispense:  454 g    Refill:  [place],  MSN, FNP-C [facility] [address] [place], [place] [address] [phone]

## 2018-09-02 ENCOUNTER — Telehealth: Payer: Self-pay | Admitting: Adult Health Nurse Practitioner

## 2018-09-02 NOTE — Telephone Encounter (Signed)
Patient called because he needs a copy of his labs faxed over to [facility].

## 2018-09-04 ENCOUNTER — Telehealth: Payer: Self-pay | Admitting: Licensed Clinical Social Worker

## 2018-09-04 ENCOUNTER — Ambulatory Visit: Payer: Self-pay | Admitting: Ophthalmology

## 2018-09-04 NOTE — Telephone Encounter (Signed)
[name] left several messages on the universal voicemail between 9/18 and 9/19 requesting a call back about needing his records sent to [facility] as soon as possible.  Staff explained that at this time, our machine has a work order in and we are unable to fax his records to please be patient until it is fixed.

## 2018-09-05 ENCOUNTER — Encounter: Payer: Self-pay | Admitting: Internal Medicine

## 2018-09-05 ENCOUNTER — Ambulatory Visit (INDEPENDENT_AMBULATORY_CARE_PROVIDER_SITE_OTHER): Payer: Self-pay | Admitting: Internal Medicine

## 2018-09-05 VITALS — BP 108/68 | HR 69 | Ht 62.5 in | Wt 309.8 lb

## 2018-09-05 DIAGNOSIS — J4540 Moderate persistent asthma, uncomplicated: Secondary | ICD-10-CM

## 2018-09-05 MED ORDER — UMECLIDINIUM-VILANTEROL 62.5-25 MCG/INH IN AEPB: 1.0000 | INHALATION_SPRAY | Freq: Every day | RESPIRATORY_TRACT | 5 refills | Status: DC

## 2018-09-05 MED ORDER — UMECLIDINIUM-VILANTEROL 62.5-25 MCG/INH IN AEPB: 1.0000 | INHALATION_SPRAY | Freq: Every day | RESPIRATORY_TRACT | 0 refills | Status: DC

## 2018-09-05 NOTE — Progress Notes (Signed)
[facility] Consultation      Assessment and Plan:  Dyspnea, possible asthma.  - We discussed today that the majority of the patient's dyspnea is undoubtedly caused by his morbid obesity, as well as ischemic cardiomyopathy.  - He has mild relief from his dyspnea with albuterol, therefore will try him empirically on Anoro inhaler to see if this helps with his breathing.  I recommended that he continue using his albuterol rescue inhaler, we will see if medication management can get him a nebulizer device so that he can use nebulized albuterol which he already has at home.  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  - Reviewed patient's previous chemistry and arterial blood gas testing which appear consistent with chronic hypercapnic respiratory failure, which is likely from obesity hypoventilation syndrome. - Continue on BiPAP at night, continue on oxygen with BiPAP.  Will need to monitor, as patient is at high risk of decompensation, complications due to obesity hypoventilation syndrome and concomitant heart failure.  Morbid obesity. - BMI 56, discussed that obesity is a major contributor to his dyspnea. -Recommend weight loss, he is recently been started on Trulicity and is hopeful that he will start to lose some weight.   Date: 09/05/2018  MRN# [id number] [name] [birth year]   [name] is a 52 y.o. old male seen in consultation for chief complaint of:    Chief Complaint  Patient presents with  . Consult    referred by Providence Crosby at [facility]  . Shortness of Breath    patient on 3L 24/7, patient has SOB with exertion, dry cough, headaches    HPI:  The patient is a 52 year old male, his past medical history includes hypertension, sleep apnea, and SVT, hypertension, psoriasis, diabetes mellitus, chronic kidney disease, CHF, Bell's palsy, ischemic cardiomyopathy.  He has morbid obesity with a BMI of  56. He notes dyspnea with any activity, he notes that when he takes off his compression stockings, his ankles swell up and gets numb. He has multiple admissions for CHF exacerbation, last being in May at [facility] He has never been diagnosed with COPD or asthma. He [place] using albuterol inhaler with dyspnea and feels that it helps, and feels that he has to use it more often.  He uses a bipap at night, every night for about 5 hours or more. He uses 3L oxygen with bipap.  He uses pulse 3L during the day.   **Chest x-ray 01/14/2018>> images personally reviewed, cardiomegaly, reduced lung volumes secondary to obesity.  Elevated right diaphragm.  Lungs are otherwise unremarkable. **CBC 07/29/2018>> bili eosinophil count 300 **Echocardiogram [facility] 04/28/18>>THE LEFT VENTRICLE IS DILATED AND SIGNIFICANT LEFT VENTRICULAR SYSTOLIC DYSFUNCTION IS PRESENT BUT THE EF CANNOT BE ADEQUATELY ASSESSED BECAUSE NOT ALL WALL MOTION COULD BE VISUALIZED. LIKEWISE THE CARDIAC VALVES WERE NEVER ADEQUATELY VISUALIZED.  Results for [name], [name] (MRN [id number]) as of 09/05/2018 11:47  Ref. Range 01/13/2018 05:00 01/13/2018 05:30 01/14/2018 06:34 06/12/2018 19:38 07/29/2018 18:45  CO2 Latest Ref Range: 20 - 29 mmol/L  40 (H) 33 (H) 29    Results for [name], [name] (MRN [id number]) as of 09/05/2018 11:47  Ref. Range 01/12/2018 20:25  pH, Arterial Latest Ref Range: 7.350 - 7.450  7.41  pCO2 arterial Latest Ref Range: 32.0 - 48.0 mmHg 73 (HH)  pO2, Arterial Latest Ref Range: 83.0 - 108.0 mmHg 62 (L)  Acid-Base Excess Latest Ref Range: 0.0 - 2.0 mmol/L 18.6 (H)  Bicarbonate Latest Ref  Range: 20.0 - 28.0 mmol/L 46.3 (H)  O2 Saturation Latest Units: % 91.6  Patient temperature Unknown 37.0    PMHX:   Past Medical History:  Diagnosis Date  . Bell's palsy   . CHF (congestive heart failure) ([place])   . Chronic kidney disease   . Diabetes mellitus without complication ([place])   . Hypertension   . NSVT (nonsustained ventricular  tachycardia) ([place])   . Obstructive sleep apnea   . Psoriasis   . Pulmonary HTN ([place])    Surgical Hx:  No past surgical history on file. Family Hx:  Family History  Problem Relation Age of Onset  . Heart failure Mother   . Kidney failure Brother    Social Hx:   Social History   Tobacco Use  . Smoking status: Never Smoker  . Smokeless tobacco: Never Used  Substance Use Topics  . Alcohol use: Yes    Alcohol/week: 2.0 standard drinks    Types: 1 Shots of liquor, 1 Cans of beer per week    Comment: very rarely 1-2 times per month  . Drug use: No   Medication:    Current Outpatient Medications:  .  albuterol (PROVENTIL HFA;VENTOLIN HFA) 108 (90 Base) MCG/ACT inhaler, Inhale 2 puffs into the lungs every 4 (four) hours as needed for wheezing or shortness of breath., Disp: 6.7 g, Rfl: 0 .  albuterol (PROVENTIL) (2.5 MG/3ML) 0.083% nebulizer solution, Take 3 mLs (2.5 mg total) by nebulization every 6 (six) hours as needed for wheezing or shortness of breath., Disp: 75 mL, Rfl: 12 .  allopurinol (ZYLOPRIM) 300 MG tablet, Take 1 tablet (300 mg total) by mouth daily., Disp: 90 tablet, Rfl: 4 .  aspirin EC 81 MG tablet, TAKE ONE TABLET BY MOUTH EVERY DAY, Disp: 30 tablet, Rfl: 0 .  atorvastatin (LIPITOR) 40 MG tablet, Take 1 tablet (40 mg total) by mouth at bedtime., Disp: 90 tablet, Rfl: 3 .  carvedilol (COREG) 25 MG tablet, Take 1 tablet (25 mg total) by mouth every 12 (twelve) hours., Disp: 180 tablet, Rfl: 3 .  Cholecalciferol (D3-1000) 1000 units tablet, Take 4,000 Units by mouth daily., Disp: , Rfl:  .  Dulaglutide (TRULICITY) 1.91 YN/8.2NF SOPN, Inject 0.5 mg subcutaneously in the skin once a week to help with diabetes and weight loss, Disp: 12 pen, Rfl: 0 .  furosemide (LASIX) 40 MG tablet, Take 1.5 tablets (60 mg total) by mouth 2 (two) times daily., Disp: 90 tablet, Rfl: 3 .  gabapentin (NEURONTIN) 300 MG capsule, Take 2 capsules (600 mg total) by mouth 3 (three) times daily., Disp:  540 capsule, Rfl: 3 .  insulin glargine (LANTUS) 100 UNIT/ML injection, Inject 0.9 mLs (90 Units total) into the skin 2 (two) times daily., Disp: 10 mL, Rfl: 10 .  insulin regular (HUMULIN R) 100 units/mL injection, INKECT 50 UNITS INTO THE SKIN 3 TIMES A DAY BEFORE MEALS, Disp: 40 mL, Rfl: 10 .  isosorbide mononitrate (IMDUR) 60 MG 24 hr tablet, Take 1 tablet (60 mg total) by mouth daily., Disp: 30 tablet, Rfl: 3 .  magnesium oxide (MAG-OX) 400 MG tablet, Take 1 tablet (400 mg total) by mouth 2 (two) times daily., Disp: 60 tablet, Rfl: 3 .  metolazone (ZAROXOLYN) 2.5 MG tablet, Take 1 tablet (2.5 mg total) by mouth as needed., Disp: 20 tablet, Rfl: 2 .  omeprazole (PRILOSEC) 20 MG capsule, Take 1 capsule (20 mg total) by mouth 2 (two) times daily before a meal., Disp: 180 capsule, Rfl: 2 .  triamcinolone ointment (KENALOG) 0.5 %, Apply 1 application topically 2 (two) times daily., Disp: 30 g, Rfl: 3   Allergies:  Shellfish allergy  Review of Systems: Gen:  Denies  fever, sweats, chills HEENT: Denies blurred vision, double vision. bleeds, sore throat Cvc:  No dizziness, chest pain. Resp:   Denies cough hemoptysis. Gi: Denies swallowing difficulty, stomach pain. Gu:  Denies bladder incontinence, burning urine Ext:   No Joint pain, stiffness. Skin: No skin rash,  hives  Endoc:  No polyuria, polydipsia. Psych: No depression, insomnia. Other:  All other systems were reviewed with the patient and were negative other that what is mentioned in the HPI.   Physical Examination:   VS: BP 108/68 (BP Location: Right Arm, Patient Position: Sitting, Cuff Size: Normal)   Pulse 69   Ht 5' 2.5" (1.588 m)   Wt (!) 309 lb 12.8 oz (140.5 kg)   SpO2 94%   BMI 55.76 kg/m    General Appearance: No distress  Neuro:without focal findings,  speech normal,  HEENT: PERRLA, EOM intact.   Pulmonary: normal breath sounds, No wheezing.  CardiovascularNormal S1,S2.  No m/r/g.   Abdomen: Benign, Soft,  non-tender. Renal:  No costovertebral tenderness  GU:  No performed at this time. Endoc: No evident thyromegaly, no signs of acromegaly. Skin:   warm, no rashes, no ecchymosis  Extremities: normal, no cyanosis, clubbing.  Other findings:    LABORATORY PANEL:   CBC No results for input(s): WBC, HGB, HCT, PLT in the last 168 hours. ------------------------------------------------------------------------------------------------------------------  Chemistries  No results for input(s): NA, K, CL, CO2, GLUCOSE, BUN, CREATININE, CALCIUM, MG, AST, ALT, ALKPHOS, BILITOT in the last 168 hours.  Invalid input(s): GFRCGP ------------------------------------------------------------------------------------------------------------------  Cardiac Enzymes No results for input(s): TROPONINI in the last 168 hours. ------------------------------------------------------------  RADIOLOGY:  No results found.     Thank  you for the consultation and for allowing [facility] to assist in the care of your patient. Our recommendations are noted above.  Please contact us if we can be of further service.   Marda Stalker, M.D., F.C.C.P.  Board Certified in Internal Medicine, Pulmonary Medicine, [place], and Sleep Medicine.  [facility] Office Number: [phone]   09/05/2018

## 2018-09-05 NOTE — Patient Instructions (Addendum)
Will start Anoro inhaler once daily.  Continue albuterol, will send prescription for nebulizer to Medication Management.  Continue using Bipap every night.  Continue oxygen.

## 2018-09-08 DIAGNOSIS — R0602 Shortness of breath: Secondary | ICD-10-CM | POA: Insufficient documentation

## 2018-09-11 ENCOUNTER — Encounter: Payer: Self-pay | Admitting: *Deleted

## 2018-09-11 ENCOUNTER — Ambulatory Visit: Payer: Self-pay | Admitting: Ophthalmology

## 2018-09-11 ENCOUNTER — Ambulatory Visit: Payer: Self-pay | Admitting: Gastroenterology

## 2018-09-11 DIAGNOSIS — K921 Melena: Secondary | ICD-10-CM

## 2018-09-18 ENCOUNTER — Ambulatory Visit: Payer: Self-pay | Admitting: Ophthalmology

## 2018-09-18 ENCOUNTER — Other Ambulatory Visit: Payer: Self-pay | Admitting: Ophthalmology

## 2018-09-18 LAB — HM DIABETES EYE EXAM

## 2018-09-22 ENCOUNTER — Ambulatory Visit (INDEPENDENT_AMBULATORY_CARE_PROVIDER_SITE_OTHER): Payer: Self-pay | Admitting: Podiatry

## 2018-09-22 ENCOUNTER — Encounter: Payer: Self-pay | Admitting: Podiatry

## 2018-09-22 DIAGNOSIS — B351 Tinea unguium: Secondary | ICD-10-CM

## 2018-09-22 DIAGNOSIS — E1142 Type 2 diabetes mellitus with diabetic polyneuropathy: Secondary | ICD-10-CM

## 2018-09-22 DIAGNOSIS — M79676 Pain in unspecified toe(s): Secondary | ICD-10-CM | POA: Diagnosis not present

## 2018-09-22 DIAGNOSIS — L409 Psoriasis, unspecified: Secondary | ICD-10-CM

## 2018-09-22 NOTE — Progress Notes (Signed)
Complaint:  Visit Type: Patient returns to my office for continued preventative foot care services. Complaint: Patient states" my nails have grown long and thick and become painful to walk and wear shoes" Patient has been diagnosed with DM with no foot complications. The patient presents for preventative foot care services. No changes to ROS.  Patient has been in the hospital for pneumonia/lung problems.  Podiatric Exam: Vascular: dorsalis pedis and posterior tibial pulses are palpable bilateral. Capillary return is immediate. Temperature gradient is WNL. Skin turgor WNL  Sensorium: Normal Semmes Weinstein monofilament test. Normal tactile sensation bilaterally. Nail Exam: Pt has thick disfigured discolored nails with subungual debris noted bilateral entire nail hallux through fifth toenails Ulcer Exam: There is no evidence of ulcer or pre-ulcerative changes or infection. Orthopedic Exam: Muscle tone and strength are WNL. No limitations in general ROM. No crepitus or effusions noted. Foot type and digits show no abnormalities. Bony prominences are unremarkable. Skin: No Porokeratosis. No infection or ulcers.  Improvement of his punctate psoriasis plantar aspect both feet.  Diagnosis:  Onychomycosis, , Pain in right toe, pain in left toes  Treatment & Plan Procedures and Treatment: Consent by patient was obtained for treatment procedures.   Debridement of mycotic and hypertrophic toenails, 1 through 5 bilateral and clearing of subungual debris. No ulceration, no infection noted.  Return Visit-Office Procedure: Patient instructed to return to the office for a follow up visit 10 weeks  for continued evaluation and treatment.    Gardiner Barefoot DPM

## 2018-09-29 ENCOUNTER — Other Ambulatory Visit: Payer: Self-pay | Admitting: Adult Health Nurse Practitioner

## 2018-09-29 DIAGNOSIS — I270 Primary pulmonary hypertension: Secondary | ICD-10-CM

## 2018-09-30 ENCOUNTER — Ambulatory Visit: Payer: Self-pay

## 2018-09-30 ENCOUNTER — Other Ambulatory Visit: Payer: Self-pay

## 2018-09-30 MED ORDER — FUROSEMIDE 40 MG PO TABS: 60.0000 mg | ORAL_TABLET | Freq: Two times a day (BID) | ORAL | 3 refills | Status: DC

## 2018-10-07 ENCOUNTER — Ambulatory Visit: Payer: Self-pay | Admitting: Adult Health Nurse Practitioner

## 2018-10-07 VITALS — BP 132/79 | HR 83 | Temp 98.2°F | Ht 63.0 in | Wt 306.3 lb

## 2018-10-07 DIAGNOSIS — I10 Essential (primary) hypertension: Secondary | ICD-10-CM

## 2018-10-07 DIAGNOSIS — E7800 Pure hypercholesterolemia, unspecified: Secondary | ICD-10-CM

## 2018-10-07 DIAGNOSIS — R252 Cramp and spasm: Secondary | ICD-10-CM | POA: Insufficient documentation

## 2018-10-07 DIAGNOSIS — I5042 Chronic combined systolic (congestive) and diastolic (congestive) heart failure: Secondary | ICD-10-CM | POA: Insufficient documentation

## 2018-10-07 DIAGNOSIS — E119 Type 2 diabetes mellitus without complications: Secondary | ICD-10-CM

## 2018-10-07 DIAGNOSIS — I270 Primary pulmonary hypertension: Secondary | ICD-10-CM

## 2018-10-07 DIAGNOSIS — N183 Chronic kidney disease, stage 3 unspecified: Secondary | ICD-10-CM

## 2018-10-07 DIAGNOSIS — I509 Heart failure, unspecified: Secondary | ICD-10-CM | POA: Insufficient documentation

## 2018-10-07 MED ORDER — ASPIRIN EC 81 MG PO TBEC: 81.0000 mg | DELAYED_RELEASE_TABLET | Freq: Every day | ORAL | 3 refills | Status: DC

## 2018-10-07 MED ORDER — MAGNESIUM OXIDE 400 MG PO TABS: 400.0000 mg | ORAL_TABLET | Freq: Every day | ORAL | 3 refills | Status: DC

## 2018-10-07 MED ORDER — ATORVASTATIN CALCIUM 40 MG PO TABS: 40.0000 mg | ORAL_TABLET | Freq: Every day | ORAL | 3 refills | Status: DC

## 2018-10-07 MED ORDER — FUROSEMIDE 40 MG PO TABS: 60.0000 mg | ORAL_TABLET | Freq: Two times a day (BID) | ORAL | 3 refills | Status: DC

## 2018-10-07 MED ORDER — ISOSORBIDE MONONITRATE ER 60 MG PO TB24: 60.0000 mg | ORAL_TABLET | Freq: Every day | ORAL | 3 refills | Status: DC

## 2018-10-07 NOTE — Progress Notes (Signed)
Patient: [name] Male    DOB: 11-Apr-1966   52 y.o.   MRN: [id number] Visit Date: 10/07/2018  Today's Provider: Staci Acosta, NP   Chief Complaint  Patient presents with  . Follow-up  . Medication Refill    at last refill did not get all of his meds  . Leg Pain    gets leg cramps at night, thinks he needs potassium    Subjective:    HPI  Was seeing Darylene Price at [facility]- last visit patient did not show and has not had FU since earlier this year. Recently hospitalized at [facility] for HF exacerbation with increased Lasix therapy. Pt was supposed to FU with Cardiology has not yet.   Taking medications as directed- only having to take Zaroxolyn about 2x a month.  States he is currently out of his Imdur x 1 week.  States that he has been working out and increasing his physical activity and is disappointed that he has gained 5 lbs since last month.  Wears continuous oxygen 3L - has to turn it up to 4L when walking around the grocery store. Does not have Anoro at this time- has to complete paperwork with MM in order to obtain inhaler. Using albuterol inhaler once daily- doesn't have nebulizer machine.   On Trulicity and Insulin for DM - last A1c was 10.7. Metformin d/c'd due to decreased kidney function.  CBGs average 120-130 fasting. Evening sugars are ranging 180-200.   Last Mg level was 1.4- has not been taking Mg supplement.      Allergies  Allergen Reactions  . Shellfish Allergy Anaphylaxis    Face and throat swelling, difficulty breathing Allergy can be triggered by touching (contact)   Previous Medications   ALBUTEROL (PROVENTIL HFA;VENTOLIN HFA) 108 (90 BASE) MCG/ACT INHALER    Inhale 2 puffs into the lungs every 4 (four) hours as needed for wheezing or shortness of breath.   ALBUTEROL (PROVENTIL) (2.5 MG/3ML) 0.083% NEBULIZER SOLUTION    Take 3 mLs (2.5 mg total) by nebulization every 6 (six) hours as needed for wheezing or shortness of breath.   ALLOPURINOL  (ZYLOPRIM) 300 MG TABLET    Take 1 tablet (300 mg total) by mouth daily.   ASPIRIN EC 81 MG TABLET    TAKE ONE TABLET BY MOUTH EVERY DAY   ATORVASTATIN (LIPITOR) 40 MG TABLET    Take 1 tablet (40 mg total) by mouth at bedtime.   CARVEDILOL (COREG) 25 MG TABLET    Take 1 tablet (25 mg total) by mouth every 12 (twelve) hours.   CHOLECALCIFEROL (D3-1000) 1000 UNITS TABLET    Take 4,000 Units by mouth daily.   DULAGLUTIDE (TRULICITY) 0.24 OX/7.3ZH SOPN    Inject 0.5 mg subcutaneously in the skin once a week to help with diabetes and weight loss   FUROSEMIDE (LASIX) 40 MG TABLET    Take 1.5 tablets (60 mg total) by mouth 2 (two) times daily.   GABAPENTIN (NEURONTIN) 300 MG CAPSULE    Take 2 capsules (600 mg total) by mouth 3 (three) times daily.   INSULIN GLARGINE (LANTUS) 100 UNIT/ML INJECTION    Inject 0.9 mLs (90 Units total) into the skin 2 (two) times daily.   INSULIN REGULAR (HUMULIN R) 100 UNITS/ML INJECTION    INKECT 50 UNITS INTO THE SKIN 3 TIMES A DAY BEFORE MEALS   ISOSORBIDE MONONITRATE (IMDUR) 60 MG 24 HR TABLET    Take 1 tablet (60 mg total) by mouth daily.  MAGNESIUM OXIDE (MAG-OX) 400 MG TABLET    Take 1 tablet (400 mg total) by mouth 2 (two) times daily.   METOLAZONE (ZAROXOLYN) 2.5 MG TABLET    Take 1 tablet (2.5 mg total) by mouth as needed.   OMEPRAZOLE (PRILOSEC) 20 MG CAPSULE    Take 1 capsule (20 mg total) by mouth 2 (two) times daily before a meal.   TRIAMCINOLONE OINTMENT (KENALOG) 0.5 %    Apply 1 application topically 2 (two) times daily.   UMECLIDINIUM-VILANTEROL (ANORO ELLIPTA) 62.5-25 MCG/INH AEPB    Inhale 1 puff into the lungs daily.   UMECLIDINIUM-VILANTEROL (ANORO ELLIPTA) 62.5-25 MCG/INH AEPB    Inhale 1 puff into the lungs daily.    Review of Systems  All other systems reviewed and are negative.   Social History   Tobacco Use  . Smoking status: Never Smoker  . Smokeless tobacco: Never Used  Substance Use Topics  . Alcohol use: Yes    Alcohol/week: 2.0  standard drinks    Types: 1 Shots of liquor, 1 Cans of beer per week    Comment: very rarely 1-2 times per month   Objective:   BP 132/79   Pulse 83   Temp 98.2 F (36.8 C)   Ht 5\' 3"  (1.6 m)   Wt (!) 306 lb 4.8 oz (138.9 kg)   BMI 54.26 kg/m   Physical Exam  Constitutional: He is oriented to person, place, and time. He appears well-developed and well-nourished.  Cardiovascular: Normal rate, regular rhythm and normal heart sounds.  Pulmonary/Chest: No respiratory distress. He has decreased breath sounds.  Abdominal: Soft. Bowel sounds are normal.  Neurological: He is alert and oriented to person, place, and time.  Skin: Skin is warm and dry.        Assessment & Plan:        Refilled medications. Continue all medications as directed.  Discussed needing to get Anoro as it will cut down on use of Albuterol.  Continue supplemental oxygen use.   DM:  Not controlled.  Encourage diabetic diet and exercise.  Continue current medication regimen.   HLD:  Controlled.   Continue current regimen.  Encourage low cholesterol, low fat diet and exercise.    Will check potassium and magnesium related to cramps.  If potassium/mg levels are low will order supplementation.  Refill daily Mg supplementation.    FU in 2 weeks for lab review. FU with HF clinic. Discussed importance of making FU appointment.   Staci Acosta, NP   [facility]

## 2018-10-08 ENCOUNTER — Other Ambulatory Visit: Payer: Self-pay

## 2018-10-09 LAB — COMPREHENSIVE METABOLIC PANEL
ALT: 45 IU/L — ABNORMAL HIGH (ref 0–44)
AST: 36 IU/L (ref 0–40)
Albumin/Globulin Ratio: 1.1 — ABNORMAL LOW (ref 1.2–2.2)
Albumin: 3.9 g/dL (ref 3.5–5.5)
Alkaline Phosphatase: 143 IU/L — ABNORMAL HIGH (ref 39–117)
BUN/Creatinine Ratio: 17 (ref 9–20)
BUN: 33 mg/dL — ABNORMAL HIGH (ref 6–24)
Bilirubin Total: 0.3 mg/dL (ref 0.0–1.2)
CO2: 32 mmol/L — ABNORMAL HIGH (ref 20–29)
Calcium: 8.2 mg/dL — ABNORMAL LOW (ref 8.7–10.2)
Chloride: 91 mmol/L — ABNORMAL LOW (ref 96–106)
Creatinine, Ser: 1.91 mg/dL — ABNORMAL HIGH (ref 0.76–1.27)
GFR calc Af Amer: 46 mL/min/{1.73_m2} — ABNORMAL LOW (ref >59)
GFR calc non Af Amer: 40 mL/min/{1.73_m2} — ABNORMAL LOW (ref >59)
Globulin, Total: 3.5 g/dL (ref 1.5–4.5)
Glucose: 233 mg/dL — ABNORMAL HIGH (ref 65–99)
Potassium: 4.3 mmol/L (ref 3.5–5.2)
Sodium: 141 mmol/L (ref 134–144)
Total Protein: 7.4 g/dL (ref 6.0–8.5)

## 2018-10-09 LAB — LIPID PANEL
Chol/HDL Ratio: 3.7 ratio (ref 0.0–5.0)
Cholesterol, Total: 145 mg/dL (ref 100–199)
HDL: 39 mg/dL — ABNORMAL LOW (ref >39)
LDL Calculated: 75 mg/dL (ref 0–99)
Triglycerides: 153 mg/dL — ABNORMAL HIGH (ref 0–149)
VLDL Cholesterol Cal: 31 mg/dL (ref 5–40)

## 2018-10-09 LAB — HEMOGLOBIN A1C
Est. average glucose Bld gHb Est-mCnc: 180 mg/dL
Hgb A1c MFr Bld: 7.9 % — ABNORMAL HIGH (ref 4.8–5.6)

## 2018-10-09 LAB — MAGNESIUM: Magnesium: 1.3 mg/dL — ABNORMAL LOW (ref 1.6–2.3)

## 2018-10-14 ENCOUNTER — Telehealth: Payer: Self-pay | Admitting: Pharmacist

## 2018-10-14 NOTE — Telephone Encounter (Signed)
10/14/2018 26:41:58 PM - Trulicity refill  30/94/07 Printed [employer] refill request for [employer] contents of 1 pen (0.75mg ) under the skin once a week #5, taking to [facility] for provider to sign.[name]

## 2018-10-14 NOTE — Telephone Encounter (Signed)
10/14/2018 2:39:59 PM - Lantus Solostar refill  10/14/18 Printed [employer] refill request for Lantus Solostar Inject 90 units 2 times a day #11, sending to [facility] for provider to sign.[name]

## 2018-10-21 ENCOUNTER — Ambulatory Visit: Payer: Self-pay

## 2018-10-23 ENCOUNTER — Ambulatory Visit: Payer: Self-pay

## 2018-10-28 ENCOUNTER — Telehealth: Payer: Self-pay | Admitting: Adult Health Nurse Practitioner

## 2018-10-28 NOTE — Telephone Encounter (Signed)
Called to make primary care appointment. Scheduled appointment for 11/13/

## 2018-10-29 ENCOUNTER — Ambulatory Visit: Payer: Medicaid Other | Admitting: Gerontology

## 2018-10-29 ENCOUNTER — Other Ambulatory Visit: Payer: Self-pay

## 2018-10-29 ENCOUNTER — Encounter: Payer: Self-pay | Admitting: Gerontology

## 2018-10-29 VITALS — BP 134/80 | HR 91 | Ht 63.0 in | Wt 305.1 lb

## 2018-10-29 DIAGNOSIS — N183 Chronic kidney disease, stage 3 unspecified: Secondary | ICD-10-CM

## 2018-10-29 DIAGNOSIS — Z09 Encounter for follow-up examination after completed treatment for conditions other than malignant neoplasm: Secondary | ICD-10-CM

## 2018-10-29 DIAGNOSIS — I10 Essential (primary) hypertension: Secondary | ICD-10-CM

## 2018-10-29 DIAGNOSIS — E1165 Type 2 diabetes mellitus with hyperglycemia: Secondary | ICD-10-CM

## 2018-10-29 DIAGNOSIS — I5032 Chronic diastolic (congestive) heart failure: Secondary | ICD-10-CM

## 2018-10-29 DIAGNOSIS — E612 Magnesium deficiency: Secondary | ICD-10-CM

## 2018-10-29 DIAGNOSIS — Z1211 Encounter for screening for malignant neoplasm of colon: Secondary | ICD-10-CM

## 2018-10-29 DIAGNOSIS — L409 Psoriasis, unspecified: Secondary | ICD-10-CM

## 2018-10-29 DIAGNOSIS — Z794 Long term (current) use of insulin: Secondary | ICD-10-CM

## 2018-10-29 DIAGNOSIS — R635 Abnormal weight gain: Secondary | ICD-10-CM

## 2018-10-29 NOTE — Patient Instructions (Signed)
Calorie Counting for Weight Loss Calories are units of energy. Your body needs a certain amount of calories from food to keep you going throughout the day. When you eat more calories than your body needs, your body stores the extra calories as fat. When you eat fewer calories than your body needs, your body burns fat to get the energy it needs. Calorie counting means keeping track of how many calories you eat and drink each day. Calorie counting can be helpful if you need to lose weight. If you make sure to eat fewer calories than your body needs, you should lose weight. Ask your health care provider what a healthy weight is for you. For calorie counting to work, you will need to eat the right number of calories in a day in order to lose a healthy amount of weight per week. A dietitian can help you determine how many calories you need in a day and will give you suggestions on how to reach your calorie goal.  A healthy amount of weight to lose per week is usually 1-2 lb (0.5-0.9 kg). This usually means that your daily calorie intake should be reduced by 500-750 calories.  Eating 1,200 - 1,500 calories per day can help most women lose weight.  Eating 1,500 - 1,800 calories per day can help most men lose weight.  What is my plan? My goal is to have __________ calories per day. If I have this many calories per day, I should lose around __________ pounds per week. What do I need to know about calorie counting? In order to meet your daily calorie goal, you will need to:  Find out how many calories are in each food you would like to eat. Try to do this before you eat.  Decide how much of the food you plan to eat.  Write down what you ate and how many calories it had. Doing this is called keeping a food log.  To successfully lose weight, it is important to balance calorie counting with a healthy lifestyle that includes regular activity. Aim for 150 minutes of moderate exercise (such as walking) or 75  minutes of vigorous exercise (such as running) each week. Where do I find calorie information?  The number of calories in a food can be found on a Nutrition Facts label. If a food does not have a Nutrition Facts label, try to look up the calories online or ask your dietitian for help. Remember that calories are listed per serving. If you choose to have more than one serving of a food, you will have to multiply the calories per serving by the amount of servings you plan to eat. For example, the label on a package of bread might say that a serving size is 1 slice and that there are 90 calories in a serving. If you eat 1 slice, you will have eaten 90 calories. If you eat 2 slices, you will have eaten 180 calories. How do I keep a food log? Immediately after each meal, record the following information in your food log:  What you ate. Don't forget to include toppings, sauces, and other extras on the food.  How much you ate. This can be measured in cups, ounces, or number of items.  How many calories each food and drink had.  The total number of calories in the meal.  Keep your food log near you, such as in a small notebook in your pocket, or use a mobile app or website. Some   programs will calculate calories for you and show you how many calories you have left for the day to meet your goal. What are some calorie counting tips?  Use your calories on foods and drinks that will fill you up and not leave you hungry: ? Some examples of foods that fill you up are nuts and nut butters, vegetables, lean proteins, and high-fiber foods like whole grains. High-fiber foods are foods with more than 5 g fiber per serving. ? Drinks such as sodas, specialty coffee drinks, alcohol, and juices have a lot of calories, yet do not fill you up.  Eat nutritious foods and avoid empty calories. Empty calories are calories you get from foods or beverages that do not have many vitamins or protein, such as candy, sweets, and  soda. It is better to have a nutritious high-calorie food (such as an avocado) than a food with few nutrients (such as a bag of chips).  Know how many calories are in the foods you eat most often. This will help you calculate calorie counts faster.  Pay attention to calories in drinks. Low-calorie drinks include water and unsweetened drinks.  Pay attention to nutrition labels for "low fat" or "fat free" foods. These foods sometimes have the same amount of calories or more calories than the full fat versions. They also often have added sugar, starch, or salt, to make up for flavor that was removed with the fat.  Find a way of tracking calories that works for you. Get creative. Try different apps or programs if writing down calories does not work for you. What are some portion control tips?  Know how many calories are in a serving. This will help you know how many servings of a certain food you can have.  Use a measuring cup to measure serving sizes. You could also try weighing out portions on a kitchen scale. With time, you will be able to estimate serving sizes for some foods.  Take some time to put servings of different foods on your favorite plates, bowls, and cups so you know what a serving looks like.  Try not to eat straight from a bag or box. Doing this can lead to overeating. Put the amount you would like to eat in a cup or on a plate to make sure you are eating the right portion.  Use smaller plates, glasses, and bowls to prevent overeating.  Try not to multitask (for example, watch TV or use your computer) while eating. If it is time to eat, sit down at a table and enjoy your food. This will help you to know when you are full. It will also help you to be aware of what you are eating and how much you are eating. What are tips for following this plan? Reading food labels  Check the calorie count compared to the serving size. The serving size may be smaller than what you are used to  eating.  Check the source of the calories. Make sure the food you are eating is high in vitamins and protein and low in saturated and trans fats. Shopping  Read nutrition labels while you shop. This will help you make healthy decisions before you decide to purchase your food.  Make a grocery list and stick to it. Cooking  Try to cook your favorite foods in a healthier way. For example, try baking instead of frying.  Use low-fat dairy products. Meal planning  Use more fruits and vegetables. Half of your plate should   be fruits and vegetables.  Include lean proteins like poultry and fish. How do I count calories when eating out?  Ask for smaller portion sizes.  Consider sharing an entree and sides instead of getting your own entree.  If you get your own entree, eat only half. Ask for a box at the beginning of your meal and put the rest of your entree in it so you are not tempted to eat it.  If calories are listed on the menu, choose the lower calorie options.  Choose dishes that include vegetables, fruits, whole grains, low-fat dairy products, and lean protein.  Choose items that are boiled, broiled, grilled, or steamed. Stay away from items that are buttered, battered, fried, or served with cream sauce. Items labeled "crispy" are usually fried, unless stated otherwise.  Choose water, low-fat milk, unsweetened iced tea, or other drinks without added sugar. If you want an alcoholic beverage, choose a lower calorie option such as a glass of wine or light beer.  Ask for dressings, sauces, and syrups on the side. These are usually high in calories, so you should limit the amount you eat.  If you want a salad, choose a garden salad and ask for grilled meats. Avoid extra toppings like bacon, cheese, or fried items. Ask for the dressing on the side, or ask for olive oil and vinegar or lemon to use as dressing.  Estimate how many servings of a food you are given. For example, a serving of  cooked rice is  cup or about the size of half a baseball. Knowing serving sizes will help you be aware of how much food you are eating at restaurants. The list below tells you how big or small some common portion sizes are based on everyday objects: ? 1 oz-4 stacked dice. ? 3 oz-1 deck of cards. ? 1 tsp-1 die. ? 1 Tbsp- a ping-pong ball. ? 2 Tbsp-1 ping-pong ball. ?  cup- baseball. ? 1 cup-1 baseball. Summary  Calorie counting means keeping track of how many calories you eat and drink each day. If you eat fewer calories than your body needs, you should lose weight.  A healthy amount of weight to lose per week is usually 1-2 lb (0.5-0.9 kg). This usually means reducing your daily calorie intake by 500-750 calories.  The number of calories in a food can be found on a Nutrition Facts label. If a food does not have a Nutrition Facts label, try to look up the calories online or ask your dietitian for help.  Use your calories on foods and drinks that will fill you up, and not on foods and drinks that will leave you hungry.  Use smaller plates, glasses, and bowls to prevent overeating. This information is not intended to replace advice given to you by your health care provider. Make sure you discuss any questions you have with your health care provider. Document Released: 12/03/2005 Document Revised: 11/02/2016 Document Reviewed: 11/02/2016 Elsevier Interactive Patient Education  2018 [place] Eating Plan DASH stands for "Dietary Approaches to Stop Hypertension." The DASH eating plan is a healthy eating plan that has been shown to reduce high blood pressure (hypertension). It may also reduce your risk for type 2 diabetes, heart disease, and stroke. The DASH eating plan may also help with weight loss. What are tips for following this plan? General guidelines  Avoid eating more than 2,300 mg (milligrams) of salt (sodium) a day. If you have hypertension, you may need to reduce your  sodium intake to 1,500 mg a day.  Limit alcohol intake to no more than 1 drink a day for nonpregnant women and 2 drinks a day for men. One drink equals 12 oz of beer, 5 oz of wine, or 1 oz of hard liquor.  Work with your health care provider to maintain a healthy body weight or to lose weight. Ask what an ideal weight is for you.  Get at least 30 minutes of exercise that causes your heart to beat faster (aerobic exercise) most days of the week. Activities may include walking, swimming, or biking.  Work with your health care provider or diet and nutrition specialist (dietitian) to adjust your eating plan to your individual calorie needs. Reading food labels  Check food labels for the amount of sodium per serving. Choose foods with less than 5 percent of the Daily Value of sodium. Generally, foods with less than 300 mg of sodium per serving fit into this eating plan.  To find whole grains, look for the word "whole" as the first word in the ingredient list. Shopping  Buy products labeled as "low-sodium" or "no salt added."  Buy fresh foods. Avoid canned foods and premade or frozen meals. Cooking  Avoid adding salt when cooking. Use salt-free seasonings or herbs instead of table salt or sea salt. Check with your health care provider or pharmacist before using salt substitutes.  Do not fry foods. Cook foods using healthy methods such as baking, boiling, grilling, and broiling instead.  Cook with heart-healthy oils, such as olive, canola, soybean, or sunflower oil. Meal planning   Eat a balanced diet that includes: ? 5 or more servings of fruits and vegetables each day. At each meal, try to fill half of your plate with fruits and vegetables. ? Up to 6-8 servings of whole grains each day. ? Less than 6 oz of lean meat, poultry, or fish each day. A 3-oz serving of meat is about the same size as a deck of cards. One egg equals 1 oz. ? 2 servings of low-fat dairy each day. ? A serving of  nuts, seeds, or beans 5 times each week. ? Heart-healthy fats. Healthy fats called Omega-3 fatty acids are found in foods such as flaxseeds and coldwater fish, like sardines, salmon, and mackerel.  Limit how much you eat of the following: ? Canned or prepackaged foods. ? Food that is high in trans fat, such as fried foods. ? Food that is high in saturated fat, such as fatty meat. ? Sweets, desserts, sugary drinks, and other foods with added sugar. ? Full-fat dairy products.  Do not salt foods before eating.  Try to eat at least 2 vegetarian meals each week.  Eat more home-cooked food and less restaurant, buffet, and fast food.  When eating at a restaurant, ask that your food be prepared with less salt or no salt, if possible. What foods are recommended? The items listed may not be a complete list. Talk with your dietitian about what dietary choices are best for you. Grains Whole-grain or whole-wheat bread. Whole-grain or whole-wheat pasta. Brown rice. [place]. Bulgur. Whole-grain and low-sodium cereals. Pita bread. Low-fat, low-sodium crackers. Whole-wheat flour tortillas. Vegetables Fresh or frozen vegetables (raw, steamed, roasted, or grilled). Low-sodium or reduced-sodium tomato and vegetable juice. Low-sodium or reduced-sodium tomato sauce and tomato paste. Low-sodium or reduced-sodium canned vegetables. Fruits All fresh, dried, or frozen fruit. Canned fruit in natural juice (without added sugar). Meat and other protein foods Skinless chicken or [place].  Ground chicken or [place]. Pork with fat trimmed off. Fish and seafood. Egg whites. Dried beans, peas, or lentils. Unsalted nuts, nut butters, and seeds. Unsalted canned beans. Lean cuts of beef with fat trimmed off. Low-sodium, lean deli meat. Dairy Low-fat (1%) or fat-free (skim) milk. Fat-free, low-fat, or reduced-fat cheeses. Nonfat, low-sodium ricotta or cottage cheese. Low-fat or nonfat yogurt. Low-fat, low-sodium  cheese. Fats and oils Soft margarine without trans fats. Vegetable oil. Low-fat, reduced-fat, or light mayonnaise and salad dressings (reduced-sodium). Canola, safflower, olive, soybean, and sunflower oils. Avocado. Seasoning and other foods Herbs. Spices. Seasoning mixes without salt. Unsalted popcorn and pretzels. Fat-free sweets. What foods are not recommended? The items listed may not be a complete list. Talk with your dietitian about what dietary choices are best for you. Grains Baked goods made with fat, such as croissants, muffins, or some breads. Dry pasta or rice meal packs. Vegetables Creamed or fried vegetables. Vegetables in a cheese sauce. Regular canned vegetables (not low-sodium or reduced-sodium). Regular canned tomato sauce and paste (not low-sodium or reduced-sodium). Regular tomato and vegetable juice (not low-sodium or reduced-sodium). Angie Fava. Olives. Fruits Canned fruit in a light or heavy syrup. Fried fruit. Fruit in cream or butter sauce. Meat and other protein foods Fatty cuts of meat. Ribs. Fried meat. Berniece Salines. Sausage. Bologna and other processed lunch meats. Salami. Fatback. Hotdogs. Bratwurst. Salted nuts and seeds. Canned beans with added salt. Canned or smoked fish. Whole eggs or egg yolks. Chicken or [place] with skin. Dairy Whole or 2% milk, cream, and half-and-half. Whole or full-fat cream cheese. Whole-fat or sweetened yogurt. Full-fat cheese. Nondairy creamers. Whipped toppings. Processed cheese and cheese spreads. Fats and oils Butter. Stick margarine. Lard. Shortening. Ghee. Bacon fat. Tropical oils, such as coconut, palm kernel, or palm oil. Seasoning and other foods Salted popcorn and pretzels. Onion salt, garlic salt, seasoned salt, table salt, and sea salt. Worcestershire sauce. Tartar sauce. Barbecue sauce. Teriyaki sauce. Soy sauce, including reduced-sodium. Steak sauce. Canned and packaged gravies. Fish sauce. Oyster sauce. Cocktail sauce. Horseradish  that you find on the shelf. Ketchup. Mustard. Meat flavorings and tenderizers. Bouillon cubes. Hot sauce and Tabasco sauce. Premade or packaged marinades. Premade or packaged taco seasonings. Relishes. Regular salad dressings. Where to find more information:  National Heart, Lung, and [place]: https://wilson-eaton.com/  American Heart Association: www.heart.org Summary  The DASH eating plan is a healthy eating plan that has been shown to reduce high blood pressure (hypertension). It may also reduce your risk for type 2 diabetes, heart disease, and stroke.  With the DASH eating plan, you should limit salt (sodium) intake to 2,300 mg a day. If you have hypertension, you may need to reduce your sodium intake to 1,500 mg a day.  When on the DASH eating plan, aim to eat more fresh fruits and vegetables, whole grains, lean proteins, low-fat dairy, and heart-healthy fats.  Work with your health care provider or diet and nutrition specialist (dietitian) to adjust your eating plan to your individual calorie needs. This information is not intended to replace advice given to you by your health care provider. Make sure you discuss any questions you have with your health care provider. Document Released: 11/22/2011 Document Revised: 11/26/2016 Document Reviewed: 11/26/2016 Elsevier Interactive Patient Education  [employer].

## 2018-10-29 NOTE — Progress Notes (Signed)
Patient: [name] Male    DOB: 1966/05/09   52 y.o.   MRN: [id number] Visit Date: 10/29/2018  Today's Provider: Langston Reusing, NP   Chief Complaint  Patient presents with  . Follow-up    high blood pressure, DM   Subjective:    HPI [name] 52 y/o male presents for lab review and follow up on HTN, DM. Lab was reviewed, HgbA1c  improved from 10.7 % 4 months ago to 7.9 % on 10/23. Magnesium was 1.3 mg/dl, he reports not taking 400 mg of Magnesium oxide that was ordered at his last visit. BUN -33, eGFR < 40, HDL < 39 mg/dl  CHF: He reports that he has not followed up with Darylene Price at CHF clinic, denies any shortness of breath, peripheral edema, and monitors weight daily.  He request prescription for  CPAP machines humidifier and face mask to be replaced, and has not used machine for 3 days. He uses 3 L of oxygen regularly and monitors oxygen saturation.  Psoriasis: He c/o worsening pruritic skin rash all over his body and head that has scabbed over, that started 1 week ago. He reports missing his appointment with [facility] today due to schedule conflict. Has a follow up appointment 10/30/18 at 11:30 am.  HTN: He continues to take 25 mg Coreg daily and monitors BP .  DM: He reports checking blood glucose twice a day, lowest blood glucose is 90 mg/dl and highest 150 mg/dl. He denies any hypoglycemic episode. He continues to take 600 mg Gabapentin TID for peripheral neuropathy with relief.     Allergies  Allergen Reactions  . Shellfish Allergy Anaphylaxis    Face and throat swelling, difficulty breathing Allergy can be triggered by touching (contact)   Previous Medications   ALBUTEROL (PROVENTIL HFA;VENTOLIN HFA) 108 (90 BASE) MCG/ACT INHALER    Inhale 2 puffs into the lungs every 4 (four) hours as needed for wheezing or shortness of breath.   ALBUTEROL (PROVENTIL) (2.5 MG/3ML) 0.083% NEBULIZER SOLUTION    Take 3 mLs (2.5 mg total) by nebulization every  6 (six) hours as needed for wheezing or shortness of breath.   ALLOPURINOL (ZYLOPRIM) 300 MG TABLET    Take 1 tablet (300 mg total) by mouth daily.   ASPIRIN EC 81 MG TABLET    Take 1 tablet (81 mg total) by mouth daily.   ATORVASTATIN (LIPITOR) 40 MG TABLET    Take 1 tablet (40 mg total) by mouth at bedtime.   CARVEDILOL (COREG) 25 MG TABLET    Take 1 tablet (25 mg total) by mouth every 12 (twelve) hours.   CHOLECALCIFEROL (D3-1000) 1000 UNITS TABLET    Take 4,000 Units by mouth daily.   DULAGLUTIDE (TRULICITY) 8.92 JJ/9.4RD SOPN    Inject 0.5 mg subcutaneously in the skin once a week to help with diabetes and weight loss   FUROSEMIDE (LASIX) 40 MG TABLET    Take 1.5 tablets (60 mg total) by mouth 2 (two) times daily.   GABAPENTIN (NEURONTIN) 300 MG CAPSULE    Take 2 capsules (600 mg total) by mouth 3 (three) times daily.   INSULIN GLARGINE (LANTUS) 100 UNIT/ML INJECTION    Inject 0.9 mLs (90 Units total) into the skin 2 (two) times daily.   INSULIN REGULAR (HUMULIN R) 100 UNITS/ML INJECTION    INKECT 50 UNITS INTO THE SKIN 3 TIMES A DAY BEFORE MEALS   ISOSORBIDE MONONITRATE (IMDUR) 60 MG 24 HR TABLET    Take  1 tablet (60 mg total) by mouth daily.   MAGNESIUM OXIDE (MAG-OX) 400 MG TABLET    Take 1 tablet (400 mg total) by mouth daily.   METOLAZONE (ZAROXOLYN) 2.5 MG TABLET    Take 1 tablet (2.5 mg total) by mouth as needed.   OMEPRAZOLE (PRILOSEC) 20 MG CAPSULE    Take 1 capsule (20 mg total) by mouth 2 (two) times daily before a meal.   TRIAMCINOLONE OINTMENT (KENALOG) 0.5 %    Apply 1 application topically 2 (two) times daily.   UMECLIDINIUM-VILANTEROL (ANORO ELLIPTA) 62.5-25 MCG/INH AEPB    Inhale 1 puff into the lungs daily.   UMECLIDINIUM-VILANTEROL (ANORO ELLIPTA) 62.5-25 MCG/INH AEPB    Inhale 1 puff into the lungs daily.    Review of Systems  Constitutional: Negative.   HENT: Negative.   Eyes: Negative.   Respiratory: Negative.   Cardiovascular: Negative.   Gastrointestinal:  Negative.   Musculoskeletal: Negative.   Skin: Positive for rash (generalized psoriasis).  Neurological: Negative.   Hematological: Negative.   Psychiatric/Behavioral: Negative.     Social History   Tobacco Use  . Smoking status: Never Smoker  . Smokeless tobacco: Never Used  Substance Use Topics  . Alcohol use: Yes    Alcohol/week: 2.0 standard drinks    Types: 1 Shots of liquor, 1 Cans of beer per week    Comment: very rarely 1-2 times per month   Objective:   BP 134/80 (BP Location: Left Wrist, Patient Position: Sitting)   Pulse 91   Ht 5' 3"  (1.6 m)   Wt (!) 305 lb 1.6 oz (138.4 kg)   SpO2 91% Comment: on 3L oxygen  BMI 54.05 kg/m   Physical Exam  Constitutional: He is oriented to person, place, and time. He appears well-developed and well-nourished.  HENT:  Head: Normocephalic and atraumatic.  Eyes: Pupils are equal, round, and reactive to light. EOM are normal.  Neck: Normal range of motion. Neck supple.  Cardiovascular: Normal rate and regular rhythm.  Pulmonary/Chest: He has wheezes (LLLobe and RLL expiratory wheezes).  Abdominal: Soft. Bowel sounds are normal.  Musculoskeletal: Normal range of motion.  Neurological: He is alert and oriented to person, place, and time.  Skin: Skin is warm. Rash (generalized psoriasis) noted.  Psychiatric: He has a normal mood and affect. His behavior is normal. Judgment and thought content normal.        Assessment & Plan:     1. Essential hypertension Non compliant with Magnesium oxide 400 mg daily, advised to take as ordered. - Magnesium; Future Monitor and document BP, continue on low salt diet, exercise 30 minutes daily and continue on weight loss regimen  2. Type 2 diabetes mellitus with hyperglycemia, with long-term current use of insulin (HCC) Continue monitoring blood glucose twice a day, continue on Insulin treatment regimen. Low carb diet, weight loss regimen.  3. Psoriasis Continue to use Triamcinolone  cream Follow up 10/30/18 with [facility]  4. Chronic diastolic heart failure (HCC) Continue taking 40 mg Furosemide daily, Monitor weight daily and notify provider > 3 lbs a day or > 6lbs in a week Referral to CHF clinic  5. Colon cancer screening Stool card provided  6. Weight gain Monitor daily and notify clinic with > 3 lbs gain in a day or  >6 lbs gain in a week.  7. CKD (chronic kidney disease) stage 3, GFR 30-59 ml/min [facility]) Nephrology referral  8. Magnesium deficiency Continue OTC  magnesium oxide 400 mg daily Magnesium  recheck in 4 weeks  9. Follow up Follow up in 5 weeks Continue using albuterol and nebulizer treatment as ordered        Langston Reusing, NP   [facility]

## 2018-10-30 ENCOUNTER — Telehealth: Payer: Self-pay | Admitting: Pharmacist

## 2018-10-30 NOTE — Telephone Encounter (Signed)
10/30/2018 9:14:47 AM - Proventil refill  10/30/18 Called [employer] for refill on Proventil HFA, allow 7-10 business days to receive. Patient should have 2 refills left.[name]

## 2018-10-31 ENCOUNTER — Telehealth: Payer: Self-pay | Admitting: Pharmacist

## 2018-10-31 NOTE — Telephone Encounter (Signed)
10/31/2018 10:09:55 AM - Lantus Solostar refill  10/31/18 Faxed [employer] refill request for Lantus Solostar Inject 90 units under the skin two times a day #11.[name]

## 2018-11-05 ENCOUNTER — Telehealth: Payer: Self-pay

## 2018-11-05 ENCOUNTER — Other Ambulatory Visit: Payer: Medicaid Other

## 2018-11-05 DIAGNOSIS — N183 Chronic kidney disease, stage 3 unspecified: Secondary | ICD-10-CM

## 2018-11-06 ENCOUNTER — Ambulatory Visit: Payer: Medicaid Other

## 2018-11-07 LAB — FECAL OCCULT BLOOD, IMMUNOCHEMICAL: Fecal Occult Bld: NEGATIVE

## 2018-11-25 ENCOUNTER — Other Ambulatory Visit: Payer: Medicaid Other

## 2018-11-28 ENCOUNTER — Telehealth: Payer: Self-pay | Admitting: Pharmacist

## 2018-11-28 NOTE — Telephone Encounter (Signed)
11/28/2018 9:43:57 AM - Humulin R Vials refill  11/28/18 Faxed [employer] refill request for Humulin R Vial Inject 50 units into the skin 3 times a day before meals==Max daily dose 150 units. Patient id# [id number] enrollment end date 08/12/2019.[name]

## 2018-12-03 ENCOUNTER — Ambulatory Visit: Payer: Medicaid Other | Admitting: Gerontology

## 2018-12-05 ENCOUNTER — Telehealth: Payer: Self-pay | Admitting: Pharmacist

## 2018-12-05 NOTE — Telephone Encounter (Signed)
12/05/2018 3:55:46 PM - Elliot Gault Ellipta refill  12/05/18 Placed refill online with [place] for Anora Ellipta 62.5-25 mcg, to release 01/13/19 order# [id number].[name]

## 2018-12-15 ENCOUNTER — Telehealth: Payer: Self-pay | Admitting: Pharmacist

## 2018-12-15 NOTE — Telephone Encounter (Signed)
12/15/2018 1:29:49 PM - Lantus Solostar refill  12/15/18 Printed [employer] refill request for Lantus Solostar Inject 90 units under the skin two times a day, # 11 (max daily dose 180 units per day.) Sending to [facility] for Teah to sign.[name]

## 2018-12-25 ENCOUNTER — Ambulatory Visit: Payer: Medicaid Other | Admitting: Podiatry

## 2018-12-26 ENCOUNTER — Telehealth: Payer: Self-pay | Admitting: Pharmacist

## 2018-12-26 NOTE — Telephone Encounter (Signed)
12/26/2018 11:48:50 AM - Humulin R Vial refill to [facility]  12/26/2018 Faxed [employer] refill request for refill on Humulin R Vial--Inject 50 units into the skin 3 times a day before meals.AJ   12/26/2018 11:46:59 AM - Lantus Solostar refill to [facility]  12/26/2018 Faxed [employer] refill request for Lantus Solostar Inject 90 units two times a day #11, Max daily dose 180 units.[name]

## 2019-01-15 ENCOUNTER — Other Ambulatory Visit: Payer: Self-pay | Admitting: Adult Health Nurse Practitioner

## 2019-01-15 ENCOUNTER — Ambulatory Visit: Payer: Medicaid Other | Admitting: Podiatry

## 2019-01-15 ENCOUNTER — Other Ambulatory Visit: Payer: Medicaid Other

## 2019-01-15 DIAGNOSIS — R1903 Right lower quadrant abdominal swelling, mass and lump: Secondary | ICD-10-CM

## 2019-01-15 DIAGNOSIS — I10 Essential (primary) hypertension: Secondary | ICD-10-CM

## 2019-01-16 LAB — MAGNESIUM: Magnesium: 1.4 mg/dL — ABNORMAL LOW (ref 1.6–2.3)

## 2019-01-19 ENCOUNTER — Ambulatory Visit (INDEPENDENT_AMBULATORY_CARE_PROVIDER_SITE_OTHER): Payer: Medicaid Other | Admitting: Podiatry

## 2019-01-19 ENCOUNTER — Encounter: Payer: Self-pay | Admitting: Podiatry

## 2019-01-19 DIAGNOSIS — M79676 Pain in unspecified toe(s): Secondary | ICD-10-CM | POA: Diagnosis not present

## 2019-01-19 DIAGNOSIS — L409 Psoriasis, unspecified: Secondary | ICD-10-CM

## 2019-01-19 DIAGNOSIS — I872 Venous insufficiency (chronic) (peripheral): Secondary | ICD-10-CM

## 2019-01-19 DIAGNOSIS — B351 Tinea unguium: Secondary | ICD-10-CM | POA: Diagnosis not present

## 2019-01-19 DIAGNOSIS — E1142 Type 2 diabetes mellitus with diabetic polyneuropathy: Secondary | ICD-10-CM | POA: Diagnosis not present

## 2019-01-19 NOTE — Progress Notes (Signed)
Complaint:  Visit Type: Patient returns to my office for continued preventative foot care services. Complaint: Patient states" my nails have grown long and thick and become painful to walk and wear shoes" Patient has been diagnosed with DM with no foot complications. The patient presents for preventative foot care services. No changes to ROS.    Podiatric Exam: Vascular: dorsalis pedis and posterior tibial pulses are palpable bilateral. Capillary return is immediate. Temperature gradient is WNL. Skin turgor WNL Venous stasis  B/l Sensorium: Normal Semmes Weinstein monofilament test. Normal tactile sensation bilaterally. Nail Exam: Pt has thick disfigured discolored nails with subungual debris noted bilateral entire nail hallux through fifth toenails Ulcer Exam: There is no evidence of ulcer or pre-ulcerative changes or infection. Orthopedic Exam: Muscle tone and strength are WNL. No limitations in general ROM. No crepitus or effusions noted. Foot type and digits show no abnormalities. Bony prominences are unremarkable. Skin: No Porokeratosis. No infection or ulcers.  Improvement of his punctate psoriasis plantar aspect both feet. Venous stasis dermatitis  Diagnosis:  Onychomycosis, , Pain in right toe, pain in left toes  Treatment & Plan Procedures and Treatment: Consent by patient was obtained for treatment procedures.   Debridement of mycotic and hypertrophic toenails, 1 through 5 bilateral and clearing of subungual debris. No ulceration, no infection noted.  Return Visit-Office Procedure: Patient instructed to return to the office for a follow up visit 10 weeks  for continued evaluation and treatment.    Gardiner Barefoot DPM

## 2019-01-20 ENCOUNTER — Encounter: Payer: Self-pay | Admitting: Adult Health Nurse Practitioner

## 2019-01-20 ENCOUNTER — Ambulatory Visit: Payer: Medicaid Other | Admitting: Adult Health Nurse Practitioner

## 2019-01-20 VITALS — BP 152/83 | HR 94 | Temp 98.1°F | Ht 63.0 in | Wt 306.3 lb

## 2019-01-20 DIAGNOSIS — E1165 Type 2 diabetes mellitus with hyperglycemia: Secondary | ICD-10-CM

## 2019-01-20 DIAGNOSIS — R1903 Right lower quadrant abdominal swelling, mass and lump: Secondary | ICD-10-CM | POA: Insufficient documentation

## 2019-01-20 DIAGNOSIS — M1A09X0 Idiopathic chronic gout, multiple sites, without tophus (tophi): Secondary | ICD-10-CM

## 2019-01-20 DIAGNOSIS — I10 Essential (primary) hypertension: Secondary | ICD-10-CM

## 2019-01-20 DIAGNOSIS — E1142 Type 2 diabetes mellitus with diabetic polyneuropathy: Secondary | ICD-10-CM

## 2019-01-20 DIAGNOSIS — Z794 Long term (current) use of insulin: Secondary | ICD-10-CM

## 2019-01-20 DIAGNOSIS — L409 Psoriasis, unspecified: Secondary | ICD-10-CM

## 2019-01-20 DIAGNOSIS — E7800 Pure hypercholesterolemia, unspecified: Secondary | ICD-10-CM

## 2019-01-20 DIAGNOSIS — I429 Cardiomyopathy, unspecified: Secondary | ICD-10-CM

## 2019-01-20 MED ORDER — ISOSORBIDE MONONITRATE ER 60 MG PO TB24: 60.0000 mg | ORAL_TABLET | Freq: Every day | ORAL | 3 refills | Status: DC

## 2019-01-20 MED ORDER — ALLOPURINOL 300 MG PO TABS: 300.0000 mg | ORAL_TABLET | Freq: Every day | ORAL | 4 refills | Status: DC

## 2019-01-20 MED ORDER — METOLAZONE 2.5 MG PO TABS: 2.5000 mg | ORAL_TABLET | ORAL | 2 refills | Status: DC | PRN

## 2019-01-20 MED ORDER — OMEPRAZOLE 20 MG PO CPDR: 20.0000 mg | DELAYED_RELEASE_CAPSULE | Freq: Two times a day (BID) | ORAL | 2 refills | Status: DC

## 2019-01-20 MED ORDER — CYCLOBENZAPRINE HCL 5 MG PO TABS: 5.0000 mg | ORAL_TABLET | Freq: Every evening | ORAL | 0 refills | Status: DC | PRN

## 2019-01-20 MED ORDER — ALBUTEROL SULFATE HFA 108 (90 BASE) MCG/ACT IN AERS: 2.0000 | INHALATION_SPRAY | RESPIRATORY_TRACT | 5 refills | Status: AC | PRN

## 2019-01-20 MED ORDER — CHOLECALCIFEROL 25 MCG (1000 UT) PO TABS: 2000.0000 [IU] | ORAL_TABLET | Freq: Every day | ORAL | 3 refills | Status: AC

## 2019-01-20 MED ORDER — CARVEDILOL 25 MG PO TABS: 25.0000 mg | ORAL_TABLET | Freq: Two times a day (BID) | ORAL | 3 refills | Status: DC

## 2019-01-20 MED ORDER — MAGNESIUM OXIDE 400 MG PO TABS: 400.0000 mg | ORAL_TABLET | Freq: Every day | ORAL | 3 refills | Status: DC

## 2019-01-20 MED ORDER — DULAGLUTIDE 0.75 MG/0.5ML ~~LOC~~ SOAJ: SUBCUTANEOUS | 2 refills | Status: DC

## 2019-01-20 MED ORDER — GABAPENTIN 300 MG PO CAPS: 600.0000 mg | ORAL_CAPSULE | Freq: Three times a day (TID) | ORAL | 3 refills | Status: DC

## 2019-01-20 MED ORDER — ATORVASTATIN CALCIUM 40 MG PO TABS: 40.0000 mg | ORAL_TABLET | Freq: Every day | ORAL | 3 refills | Status: DC

## 2019-01-20 MED ORDER — INSULIN GLARGINE 100 UNIT/ML ~~LOC~~ SOLN: 90.0000 [IU] | Freq: Two times a day (BID) | SUBCUTANEOUS | 10 refills | Status: DC

## 2019-01-20 MED ORDER — INSULIN REGULAR HUMAN 100 UNIT/ML IJ SOLN: INTRAMUSCULAR | 10 refills | Status: DC

## 2019-01-20 MED ORDER — TRIAMCINOLONE ACETONIDE 0.5 % EX OINT: 1.0000 "application " | TOPICAL_OINTMENT | Freq: Two times a day (BID) | CUTANEOUS | 3 refills | Status: DC

## 2019-01-20 NOTE — Progress Notes (Signed)
Patient: [name] Male    DOB: 09/11/1966   53 y.o.   MRN: [id number] Visit Date: 01/20/2019  Today's Provider: Staci Acosta, NP   Chief Complaint  Patient presents with  . Follow-up    has not been contacted for ultrasound  . Headache    has persisted for a week   Subjective:    HPI   Has large golf ball size mass in his RLQ x 2 weeks has not gotten larger in size- mass does not hurt.  Has been recently (over the past week) coughing up increased amounts of clear sputum- no actual vomit.   Has been frequency in stools despite decreased appetite- no diarrhea.  States that he has been "feeling hot"    Last BP was 134/80- elevated tonight. Taking medications as directed.   Last A1c was 7.9. On last pen of trulicity. Fasting CBGs averaging one-teens at home .  Weighing BID at home. Weight is stable at home.  On continuous oxygen- 3L [place].   Headache- has recently gotten worse behind his eyes- recently gotten new glasses- has seasonal allergies. Headache 8/10 consistently x 3 days.  No visual changes.  Has taken extra strength tylenol 4 at a time with relief.    Allergies  Allergen Reactions  . Shellfish Allergy Anaphylaxis    Face and throat swelling, difficulty breathing Allergy can be triggered by touching (contact)   Previous Medications   ALBUTEROL (PROVENTIL HFA;VENTOLIN HFA) 108 (90 BASE) MCG/ACT INHALER    Inhale 2 puffs into the lungs every 4 (four) hours as needed for wheezing or shortness of breath.   ALBUTEROL (PROVENTIL) (2.5 MG/3ML) 0.083% NEBULIZER SOLUTION    Take 3 mLs (2.5 mg total) by nebulization every 6 (six) hours as needed for wheezing or shortness of breath.   ALLOPURINOL (ZYLOPRIM) 300 MG TABLET    Take 1 tablet (300 mg total) by mouth daily.   AMLODIPINE (NORVASC) 5 MG TABLET    Take by mouth.   ASPIRIN EC 81 MG TABLET    Take 1 tablet (81 mg total) by mouth daily.   ATORVASTATIN (LIPITOR) 40 MG TABLET    Take 1 tablet (40 mg total) by  mouth at bedtime.   CARVEDILOL (COREG) 25 MG TABLET    Take 1 tablet (25 mg total) by mouth every 12 (twelve) hours.   CHOLECALCIFEROL (D3-1000) 1000 UNITS TABLET    Take 4,000 Units by mouth daily.   CYCLOBENZAPRINE (FLEXERIL) 5 MG TABLET    Take by mouth.   DICYCLOMINE (BENTYL) 10 MG CAPSULE    Take by mouth.   DULAGLUTIDE (TRULICITY) 1.02 HE/5.2DP SOPN    Inject 0.5 mg subcutaneously in the skin once a week to help with diabetes and weight loss   EPLERENONE (INSPRA) 50 MG TABLET    Take by mouth.   FUROSEMIDE (LASIX) 40 MG TABLET    Take 1.5 tablets (60 mg total) by mouth 2 (two) times daily.   GABAPENTIN (NEURONTIN) 300 MG CAPSULE    Take 2 capsules (600 mg total) by mouth 3 (three) times daily.   GLUCOSE BLOOD (PRECISION QID TEST) TEST STRIP    by Other route Three (3) times a day with a meal.   HYDRALAZINE (APRESOLINE) 100 MG TABLET    Take by mouth.   INSULIN ASPART (NOVOLOG) 100 UNIT/ML INJECTION    Inject into the skin.   INSULIN GLARGINE (LANTUS) 100 UNIT/ML INJECTION    Inject 0.9 mLs (90 Units total) into the skin  2 (two) times daily.   INSULIN REGULAR (HUMULIN R) 100 UNITS/ML INJECTION    INKECT 50 UNITS INTO THE SKIN 3 TIMES A DAY BEFORE MEALS   ISOSORBIDE DINITRATE (ISORDIL) 20 MG TABLET    Take by mouth.   ISOSORBIDE MONONITRATE (IMDUR) 60 MG 24 HR TABLET    Take 1 tablet (60 mg total) by mouth daily.   LIRAGLUTIDE (VICTOZA) 18 MG/3ML SOPN    Inject into the skin.   MAGNESIUM OXIDE (MAG-OX) 400 MG TABLET    Take 1 tablet (400 mg total) by mouth daily.   METOLAZONE (ZAROXOLYN) 2.5 MG TABLET    Take 1 tablet (2.5 mg total) by mouth as needed.   OMEPRAZOLE (PRILOSEC) 20 MG CAPSULE    Take 1 capsule (20 mg total) by mouth 2 (two) times daily before a meal.   PANTOPRAZOLE (PROTONIX) 40 MG TABLET    Take by mouth.   TORSEMIDE (DEMADEX) 100 MG TABLET    Take by mouth.   TRIAMCINOLONE OINTMENT (KENALOG) 0.5 %    Apply 1 application topically 2 (two) times daily.    UMECLIDINIUM-VILANTEROL (ANORO ELLIPTA) 62.5-25 MCG/INH AEPB    Inhale 1 puff into the lungs daily.   UNABLE TO FIND    BIPAP at 22/15, I time of 1.2, rate of 12, volume ~700cc  for severe obstructive sleep apnea, heated humidification    Review of Systems  Constitutional: Positive for appetite change, chills and fatigue.  Gastrointestinal: Positive for nausea.  Neurological: Positive for headaches.    Social History   Tobacco Use  . Smoking status: Never Smoker  . Smokeless tobacco: Never Used  Substance Use Topics  . Alcohol use: Yes    Alcohol/week: 2.0 standard drinks    Types: 1 Shots of liquor, 1 Cans of beer per week    Comment: very rarely 1-2 times per month   Objective:   BP (!) 152/83 (BP Location: Left Arm, Patient Position: Sitting, Cuff Size: Normal)   Pulse 94   Temp 98.1 F (36.7 C) (Oral)   Ht 5\' 3"  (1.6 m)   Wt (!) 306 lb 4.8 oz (138.9 kg)   BMI 54.26 kg/m   Physical Exam Constitutional:      Appearance: He is well-developed. He is obese.  HENT:     Head: Normocephalic and atraumatic.  Neck:     Musculoskeletal: Normal range of motion and neck supple.  Cardiovascular:     Rate and Rhythm: Normal rate and regular rhythm.  Pulmonary:     Effort: Pulmonary effort is normal.     Breath sounds: Normal breath sounds.  Abdominal:     General: Bowel sounds are normal. There is no distension.     Palpations: Abdomen is soft. There is mass.     Tenderness: There is no abdominal tenderness. There is no guarding.    Lymphadenopathy:     Cervical: No cervical adenopathy.  Skin:    General: Skin is warm and dry.  Neurological:     Mental Status: He is alert and oriented to person, place, and time.         Assessment & Plan:        Continue with abdominal ultrasound as ordered.  ED precautions for mass.   HA: Take a dose of Flexeril at bedtime.  Discussed appropriate Tylenol use.  Take OTC allergy medications.  ED precautions given.   HTN:    Elevated today.  FU in 2 weeks for recheck.  Goal BP <140/80.  Continue current medication regimen.  Encourage low salt diet and exercise.  Labs ordered tonight- pt to come and have them done prior to next appointment.   DM:  Improved.  Encourage diabetic diet and exercise.  Continue current medication regimen.  Will FU with MM in regards to Trulicity.   Continue to FU with HF clinic and weigh as directed.   All medications refilled and reviewed use.         Staci Acosta, NP   [facility]

## 2019-01-22 ENCOUNTER — Other Ambulatory Visit: Payer: Medicaid Other

## 2019-01-27 ENCOUNTER — Other Ambulatory Visit: Payer: Medicaid Other

## 2019-01-27 ENCOUNTER — Other Ambulatory Visit: Payer: Self-pay | Admitting: Internal Medicine

## 2019-01-28 ENCOUNTER — Ambulatory Visit: Admission: RE | Admit: 2019-01-28 | Payer: Self-pay | Source: Ambulatory Visit

## 2019-01-30 ENCOUNTER — Other Ambulatory Visit: Payer: Self-pay

## 2019-01-30 DIAGNOSIS — R1903 Right lower quadrant abdominal swelling, mass and lump: Secondary | ICD-10-CM

## 2019-02-05 ENCOUNTER — Ambulatory Visit: Payer: Medicaid Other

## 2019-02-12 ENCOUNTER — Ambulatory Visit: Payer: Medicaid Other | Admitting: Family Medicine

## 2019-02-12 ENCOUNTER — Other Ambulatory Visit: Payer: Self-pay | Admitting: Internal Medicine

## 2019-02-12 DIAGNOSIS — R0602 Shortness of breath: Secondary | ICD-10-CM

## 2019-02-12 NOTE — Progress Notes (Signed)
Mr. Sudduth presented today for his appointment when he ran out of oxygen, he usually maintained on 3L continuously. Stopped outside the office for severe SOB. We got him into the office, but did not have any oxygen in the building. His friend who came with him was not sure if he could find his oxygen at his house and was not willing to go get it. Rescue was called for evaluation and he was restarted on oxygen by the rescue. He was offered a few options- going to the hospital for evaluation, or go home and get switched over on his oxygen. He chose to go home on the rescue's oxygen, get switched over to his oxygen at home and they will make sure he's doing OK. Appointment today will be rescheduled for next week.

## 2019-02-17 ENCOUNTER — Telehealth: Payer: Self-pay | Admitting: Pharmacist

## 2019-02-17 NOTE — Telephone Encounter (Signed)
02/17/2019 12:41:24 PM - Elliot Gault Ellipta 62.5/25 refill  02/17/2019 Placed refill for Anora Ellipta 62.5/25 online with [employer] to release 03/16/2019 order# [id number].[name]

## 2019-02-18 ENCOUNTER — Other Ambulatory Visit: Payer: Self-pay

## 2019-02-18 ENCOUNTER — Emergency Department (HOSPITAL_COMMUNITY): Payer: Medicaid Other

## 2019-02-18 ENCOUNTER — Inpatient Hospital Stay (HOSPITAL_COMMUNITY)
Admission: EM | Admit: 2019-02-18 | Discharge: 2019-02-24 | DRG: 637 | Disposition: A | Discharge status: Home / Self Care | Payer: Medicaid Other | Attending: Student in an Organized Health Care Education/Training Program | Admitting: Student in an Organized Health Care Education/Training Program

## 2019-02-18 ENCOUNTER — Encounter (HOSPITAL_COMMUNITY): Payer: Self-pay | Admitting: *Deleted

## 2019-02-18 DIAGNOSIS — Z794 Long term (current) use of insulin: Secondary | ICD-10-CM

## 2019-02-18 DIAGNOSIS — R4182 Altered mental status, unspecified: Secondary | ICD-10-CM | POA: Diagnosis not present

## 2019-02-18 DIAGNOSIS — Z841 Family history of disorders of kidney and ureter: Secondary | ICD-10-CM | POA: Diagnosis not present

## 2019-02-18 DIAGNOSIS — I130 Hypertensive heart and chronic kidney disease with heart failure and stage 1 through stage 4 chronic kidney disease, or unspecified chronic kidney disease: Secondary | ICD-10-CM | POA: Diagnosis present

## 2019-02-18 DIAGNOSIS — N183 Chronic kidney disease, stage 3 (moderate): Secondary | ICD-10-CM | POA: Diagnosis present

## 2019-02-18 DIAGNOSIS — E662 Morbid (severe) obesity with alveolar hypoventilation: Secondary | ICD-10-CM | POA: Diagnosis present

## 2019-02-18 DIAGNOSIS — E11649 Type 2 diabetes mellitus with hypoglycemia without coma: Secondary | ICD-10-CM | POA: Diagnosis present

## 2019-02-18 DIAGNOSIS — I5022 Chronic systolic (congestive) heart failure: Secondary | ICD-10-CM | POA: Diagnosis not present

## 2019-02-18 DIAGNOSIS — I509 Heart failure, unspecified: Secondary | ICD-10-CM

## 2019-02-18 DIAGNOSIS — Z6841 Body Mass Index (BMI) 40.0 and over, adult: Secondary | ICD-10-CM | POA: Diagnosis not present

## 2019-02-18 DIAGNOSIS — M1A9XX0 Chronic gout, unspecified, without tophus (tophi): Secondary | ICD-10-CM | POA: Diagnosis present

## 2019-02-18 DIAGNOSIS — R9431 Abnormal electrocardiogram [ECG] [EKG]: Secondary | ICD-10-CM | POA: Diagnosis present

## 2019-02-18 DIAGNOSIS — J9621 Acute and chronic respiratory failure with hypoxia: Secondary | ICD-10-CM | POA: Diagnosis present

## 2019-02-18 DIAGNOSIS — I878 Other specified disorders of veins: Secondary | ICD-10-CM | POA: Diagnosis present

## 2019-02-18 DIAGNOSIS — I272 Pulmonary hypertension, unspecified: Secondary | ICD-10-CM | POA: Diagnosis present

## 2019-02-18 DIAGNOSIS — I5043 Acute on chronic combined systolic (congestive) and diastolic (congestive) heart failure: Secondary | ICD-10-CM | POA: Diagnosis present

## 2019-02-18 DIAGNOSIS — Z8249 Family history of ischemic heart disease and other diseases of the circulatory system: Secondary | ICD-10-CM | POA: Diagnosis not present

## 2019-02-18 DIAGNOSIS — E1142 Type 2 diabetes mellitus with diabetic polyneuropathy: Secondary | ICD-10-CM | POA: Diagnosis present

## 2019-02-18 DIAGNOSIS — Z9981 Dependence on supplemental oxygen: Secondary | ICD-10-CM | POA: Diagnosis not present

## 2019-02-18 DIAGNOSIS — I2510 Atherosclerotic heart disease of native coronary artery without angina pectoris: Secondary | ICD-10-CM | POA: Diagnosis present

## 2019-02-18 DIAGNOSIS — E1122 Type 2 diabetes mellitus with diabetic chronic kidney disease: Secondary | ICD-10-CM | POA: Diagnosis present

## 2019-02-18 DIAGNOSIS — E1165 Type 2 diabetes mellitus with hyperglycemia: Secondary | ICD-10-CM | POA: Diagnosis not present

## 2019-02-18 DIAGNOSIS — R55 Syncope and collapse: Secondary | ICD-10-CM | POA: Diagnosis not present

## 2019-02-18 DIAGNOSIS — Z91013 Allergy to seafood: Secondary | ICD-10-CM

## 2019-02-18 DIAGNOSIS — E872 Acidosis: Secondary | ICD-10-CM | POA: Diagnosis present

## 2019-02-18 DIAGNOSIS — I255 Ischemic cardiomyopathy: Secondary | ICD-10-CM | POA: Diagnosis present

## 2019-02-18 DIAGNOSIS — L409 Psoriasis, unspecified: Secondary | ICD-10-CM | POA: Diagnosis present

## 2019-02-18 DIAGNOSIS — J9811 Atelectasis: Secondary | ICD-10-CM | POA: Diagnosis present

## 2019-02-18 DIAGNOSIS — E162 Hypoglycemia, unspecified: Secondary | ICD-10-CM | POA: Diagnosis present

## 2019-02-18 DIAGNOSIS — Z79899 Other long term (current) drug therapy: Secondary | ICD-10-CM | POA: Diagnosis not present

## 2019-02-18 DIAGNOSIS — G9349 Other encephalopathy: Secondary | ICD-10-CM | POA: Diagnosis not present

## 2019-02-18 DIAGNOSIS — G9341 Metabolic encephalopathy: Secondary | ICD-10-CM | POA: Diagnosis present

## 2019-02-18 DIAGNOSIS — J9690 Respiratory failure, unspecified, unspecified whether with hypoxia or hypercapnia: Secondary | ICD-10-CM

## 2019-02-18 DIAGNOSIS — J9622 Acute and chronic respiratory failure with hypercapnia: Secondary | ICD-10-CM | POA: Diagnosis present

## 2019-02-18 DIAGNOSIS — J9612 Chronic respiratory failure with hypercapnia: Secondary | ICD-10-CM | POA: Diagnosis not present

## 2019-02-18 DIAGNOSIS — G4733 Obstructive sleep apnea (adult) (pediatric): Secondary | ICD-10-CM | POA: Diagnosis not present

## 2019-02-18 DIAGNOSIS — E785 Hyperlipidemia, unspecified: Secondary | ICD-10-CM | POA: Diagnosis present

## 2019-02-18 DIAGNOSIS — I5042 Chronic combined systolic (congestive) and diastolic (congestive) heart failure: Secondary | ICD-10-CM

## 2019-02-18 DIAGNOSIS — Z7982 Long term (current) use of aspirin: Secondary | ICD-10-CM | POA: Diagnosis not present

## 2019-02-18 LAB — COMPREHENSIVE METABOLIC PANEL
ALT: 17 U/L (ref 0–44)
AST: 19 U/L (ref 15–41)
Albumin: 3.3 g/dL — ABNORMAL LOW (ref 3.5–5.0)
Alkaline Phosphatase: 133 U/L — ABNORMAL HIGH (ref 38–126)
Anion gap: 10 (ref 5–15)
BUN: 21 mg/dL — ABNORMAL HIGH (ref 6–20)
CO2: 35 mmol/L — ABNORMAL HIGH (ref 22–32)
Calcium: 9.4 mg/dL (ref 8.9–10.3)
Chloride: 98 mmol/L (ref 98–111)
Creatinine, Ser: 1.66 mg/dL — ABNORMAL HIGH (ref 0.61–1.24)
GFR calc Af Amer: 55 mL/min — ABNORMAL LOW (ref >60)
GFR calc non Af Amer: 47 mL/min — ABNORMAL LOW (ref >60)
Glucose, Bld: 24 mg/dL — CL (ref 70–99)
Potassium: 3.6 mmol/L (ref 3.5–5.1)
Sodium: 143 mmol/L (ref 135–145)
Total Bilirubin: 0.9 mg/dL (ref 0.3–1.2)
Total Protein: 8.4 g/dL — ABNORMAL HIGH (ref 6.5–8.1)

## 2019-02-18 LAB — CBC WITH DIFFERENTIAL/PLATELET
Abs Immature Granulocytes: 0.05 10*3/uL (ref 0.00–0.07)
Basophils Absolute: 0 10*3/uL (ref 0.0–0.1)
Basophils Relative: 0 %
Eosinophils Absolute: 0.2 10*3/uL (ref 0.0–0.5)
Eosinophils Relative: 1 %
HCT: 41.4 % (ref 39.0–52.0)
Hemoglobin: 11.7 g/dL — ABNORMAL LOW (ref 13.0–17.0)
Immature Granulocytes: 0 %
Lymphocytes Relative: 11 %
Lymphs Abs: 1.3 10*3/uL (ref 0.7–4.0)
MCH: 25.5 pg — ABNORMAL LOW (ref 26.0–34.0)
MCHC: 28.3 g/dL — ABNORMAL LOW (ref 30.0–36.0)
MCV: 90.2 fL (ref 80.0–100.0)
Monocytes Absolute: 0.6 10*3/uL (ref 0.1–1.0)
Monocytes Relative: 5 %
Neutro Abs: 10 10*3/uL — ABNORMAL HIGH (ref 1.7–7.7)
Neutrophils Relative %: 83 %
Platelets: 309 10*3/uL (ref 150–400)
RBC: 4.59 MIL/uL (ref 4.22–5.81)
RDW: 20.3 % — ABNORMAL HIGH (ref 11.5–15.5)
WBC: 12.2 10*3/uL — ABNORMAL HIGH (ref 4.0–10.5)
nRBC: 0.5 % — ABNORMAL HIGH (ref 0.0–0.2)

## 2019-02-18 LAB — RAPID URINE DRUG SCREEN, HOSP PERFORMED
Amphetamines: NOT DETECTED
Barbiturates: NOT DETECTED
Benzodiazepines: NOT DETECTED
Cocaine: NOT DETECTED
Opiates: NOT DETECTED
Tetrahydrocannabinol: NOT DETECTED

## 2019-02-18 LAB — POCT I-STAT 7, (LYTES, BLD GAS, ICA,H+H)
Acid-Base Excess: 9 mmol/L — ABNORMAL HIGH (ref 0.0–2.0)
Bicarbonate: 37.9 mmol/L — ABNORMAL HIGH (ref 20.0–28.0)
Calcium, Ion: 1.22 mmol/L (ref 1.15–1.40)
HCT: 36 % — ABNORMAL LOW (ref 39.0–52.0)
Hemoglobin: 12.2 g/dL — ABNORMAL LOW (ref 13.0–17.0)
O2 Saturation: 97 %
Patient temperature: 98.1
Potassium: 3.9 mmol/L (ref 3.5–5.1)
Sodium: 142 mmol/L (ref 135–145)
TCO2: 40 mmol/L — ABNORMAL HIGH (ref 22–32)
pCO2 arterial: 70.5 mmHg (ref 32.0–48.0)
pH, Arterial: 7.337 — ABNORMAL LOW (ref 7.350–7.450)
pO2, Arterial: 106 mmHg (ref 83.0–108.0)

## 2019-02-18 LAB — URINALYSIS, ROUTINE W REFLEX MICROSCOPIC
Bilirubin Urine: NEGATIVE
Glucose, UA: NEGATIVE mg/dL
Hgb urine dipstick: NEGATIVE
Ketones, ur: NEGATIVE mg/dL
Leukocytes,Ua: NEGATIVE
Nitrite: NEGATIVE
Protein, ur: NEGATIVE mg/dL
Specific Gravity, Urine: 1.016 (ref 1.005–1.030)
pH: 7 (ref 5.0–8.0)

## 2019-02-18 LAB — CBG MONITORING, ED
Glucose-Capillary: <10 mg/dL — CL (ref 70–99)
Glucose-Capillary: 43 mg/dL — CL (ref 70–99)
Glucose-Capillary: 70 mg/dL (ref 70–99)
Glucose-Capillary: 72 mg/dL (ref 70–99)
Glucose-Capillary: 102 mg/dL — ABNORMAL HIGH (ref 70–99)
Glucose-Capillary: 124 mg/dL — ABNORMAL HIGH (ref 70–99)
Glucose-Capillary: 97 mg/dL (ref 70–99)
Glucose-Capillary: 134 mg/dL — ABNORMAL HIGH (ref 70–99)

## 2019-02-18 LAB — POCT I-STAT EG7
Acid-Base Excess: 13 mmol/L — ABNORMAL HIGH (ref 0.0–2.0)
Bicarbonate: 38.9 mmol/L — ABNORMAL HIGH (ref 20.0–28.0)
Calcium, Ion: 0.92 mmol/L — ABNORMAL LOW (ref 1.15–1.40)
HCT: 39 % (ref 39.0–52.0)
Hemoglobin: 13.3 g/dL (ref 13.0–17.0)
O2 Saturation: 98 %
Potassium: 6.9 mmol/L (ref 3.5–5.1)
Sodium: 138 mmol/L (ref 135–145)
TCO2: 41 mmol/L — ABNORMAL HIGH (ref 22–32)
pCO2, Ven: 52.7 mmHg (ref 44.0–60.0)
pH, Ven: 7.476 — ABNORMAL HIGH (ref 7.250–7.430)
pO2, Ven: 106 mmHg — ABNORMAL HIGH (ref 32.0–45.0)

## 2019-02-18 LAB — LACTIC ACID, PLASMA: Lactic Acid, Venous: 1.2 mmol/L (ref 0.5–1.9)

## 2019-02-18 LAB — GLUCOSE, CAPILLARY: Glucose-Capillary: 56 mg/dL — ABNORMAL LOW (ref 70–99)

## 2019-02-18 LAB — TROPONIN I: Troponin I: <0.03 ng/mL (ref <0.03)

## 2019-02-18 LAB — INFLUENZA PANEL BY PCR (TYPE A & B)
Influenza A By PCR: NEGATIVE
Influenza B By PCR: NEGATIVE

## 2019-02-18 LAB — PROTIME-INR
INR: 1.1 (ref 0.8–1.2)
Prothrombin Time: 14.3 seconds (ref 11.4–15.2)

## 2019-02-18 LAB — BRAIN NATRIURETIC PEPTIDE: B Natriuretic Peptide: 165 pg/mL — ABNORMAL HIGH (ref 0.0–100.0)

## 2019-02-18 MED ORDER — DEXTROSE 50 % IV SOLN
INTRAVENOUS | Status: AC
  Administered 2019-02-18: 50 mL
  Filled 2019-02-18: qty 50

## 2019-02-18 MED ORDER — CARVEDILOL 12.5 MG PO TABS
25.0000 mg | ORAL_TABLET | Freq: Two times a day (BID) | ORAL | Status: DC
  Administered 2019-02-18 – 2019-02-19 (×2): 25 mg via ORAL
  Administered 2019-02-19: 12.5 mg via ORAL
  Administered 2019-02-20 – 2019-02-24 (×8): 25 mg via ORAL
  Filled 2019-02-18 (×11): qty 2

## 2019-02-18 MED ORDER — SODIUM CHLORIDE 0.9% FLUSH
3.0000 mL | Freq: Two times a day (BID) | INTRAVENOUS | Status: DC
  Administered 2019-02-18 – 2019-02-24 (×12): 3 mL via INTRAVENOUS

## 2019-02-18 MED ORDER — ACETAMINOPHEN 650 MG RE SUPP: 650.0000 mg | Freq: Four times a day (QID) | RECTAL | Status: DC | PRN

## 2019-02-18 MED ORDER — DEXTROSE 10 % IV SOLN: 100.0000 mL | Freq: Once | INTRAVENOUS | Status: DC

## 2019-02-18 MED ORDER — DEXTROSE 10 % IV SOLN
INTRAVENOUS | Status: DC
  Administered 2019-02-18: 18:00:00 via INTRAVENOUS

## 2019-02-18 MED ORDER — TRIAMCINOLONE ACETONIDE 0.5 % EX OINT
1.0000 "application " | TOPICAL_OINTMENT | Freq: Two times a day (BID) | CUTANEOUS | Status: DC
  Administered 2019-02-18 – 2019-02-19 (×2): 1 via TOPICAL
  Filled 2019-02-18: qty 15

## 2019-02-18 MED ORDER — SENNOSIDES-DOCUSATE SODIUM 8.6-50 MG PO TABS
1.0000 | ORAL_TABLET | Freq: Every evening | ORAL | Status: DC | PRN
  Administered 2019-02-20: 1 via ORAL
  Filled 2019-02-18: qty 1

## 2019-02-18 MED ORDER — ASPIRIN EC 81 MG PO TBEC
81.0000 mg | DELAYED_RELEASE_TABLET | Freq: Every day | ORAL | Status: DC
  Administered 2019-02-19 – 2019-02-24 (×5): 81 mg via ORAL
  Filled 2019-02-18 (×5): qty 1

## 2019-02-18 MED ORDER — GABAPENTIN 300 MG PO CAPS
600.0000 mg | ORAL_CAPSULE | Freq: Three times a day (TID) | ORAL | Status: DC
  Administered 2019-02-18 – 2019-02-24 (×16): 600 mg via ORAL
  Filled 2019-02-18 (×16): qty 2

## 2019-02-18 MED ORDER — FUROSEMIDE 10 MG/ML IJ SOLN
40.0000 mg | Freq: Once | INTRAMUSCULAR | Status: AC
  Administered 2019-02-18: 40 mg via INTRAVENOUS
  Filled 2019-02-18: qty 4

## 2019-02-18 MED ORDER — UMECLIDINIUM-VILANTEROL 62.5-25 MCG/INH IN AEPB
1.0000 | INHALATION_SPRAY | Freq: Every day | RESPIRATORY_TRACT | Status: DC
  Administered 2019-02-19 – 2019-02-24 (×6): 1 via RESPIRATORY_TRACT
  Filled 2019-02-18: qty 14

## 2019-02-18 MED ORDER — FUROSEMIDE 20 MG PO TABS: 60.0000 mg | ORAL_TABLET | Freq: Two times a day (BID) | ORAL | Status: DC

## 2019-02-18 MED ORDER — ALBUTEROL SULFATE (2.5 MG/3ML) 0.083% IN NEBU
5.0000 mg | INHALATION_SOLUTION | Freq: Once | RESPIRATORY_TRACT | Status: AC
  Administered 2019-02-18: 5 mg via RESPIRATORY_TRACT

## 2019-02-18 MED ORDER — ATORVASTATIN CALCIUM 40 MG PO TABS
40.0000 mg | ORAL_TABLET | Freq: Every day | ORAL | Status: DC
  Administered 2019-02-18 – 2019-02-23 (×6): 40 mg via ORAL
  Filled 2019-02-18 (×6): qty 1

## 2019-02-18 MED ORDER — ENOXAPARIN SODIUM 80 MG/0.8ML ~~LOC~~ SOLN
0.5000 mg/kg | SUBCUTANEOUS | Status: DC
  Administered 2019-02-18 – 2019-02-23 (×6): 70 mg via SUBCUTANEOUS
  Filled 2019-02-18 (×6): qty 0.8

## 2019-02-18 MED ORDER — ALBUTEROL SULFATE (2.5 MG/3ML) 0.083% IN NEBU: 2.5000 mg | INHALATION_SOLUTION | Freq: Four times a day (QID) | RESPIRATORY_TRACT | Status: DC | PRN

## 2019-02-18 MED ORDER — CYCLOBENZAPRINE HCL 10 MG PO TABS
5.0000 mg | ORAL_TABLET | Freq: Every evening | ORAL | Status: DC | PRN
  Administered 2019-02-20: 5 mg via ORAL
  Filled 2019-02-18: qty 1

## 2019-02-18 MED ORDER — ALLOPURINOL 300 MG PO TABS
300.0000 mg | ORAL_TABLET | Freq: Every day | ORAL | Status: DC
  Administered 2019-02-18 – 2019-02-24 (×6): 300 mg via ORAL
  Filled 2019-02-18 (×6): qty 1

## 2019-02-18 MED ORDER — ISOSORBIDE MONONITRATE ER 30 MG PO TB24: 60.0000 mg | ORAL_TABLET | Freq: Every day | ORAL | Status: DC

## 2019-02-18 MED ORDER — DEXTROSE 50 % IV SOLN
1.0000 | INTRAVENOUS | Status: DC | PRN
  Administered 2019-02-19: 12.5 mL via INTRAVENOUS
  Administered 2019-02-19: 50 mL via INTRAVENOUS

## 2019-02-18 MED ORDER — ACETAMINOPHEN 325 MG PO TABS
650.0000 mg | ORAL_TABLET | Freq: Four times a day (QID) | ORAL | Status: DC | PRN
  Administered 2019-02-19 – 2019-02-20 (×3): 650 mg via ORAL
  Filled 2019-02-18 (×4): qty 2

## 2019-02-18 MED ORDER — PANTOPRAZOLE SODIUM 40 MG PO TBEC
40.0000 mg | DELAYED_RELEASE_TABLET | Freq: Every day | ORAL | Status: DC
  Administered 2019-02-18 – 2019-02-24 (×6): 40 mg via ORAL
  Filled 2019-02-18 (×6): qty 1

## 2019-02-18 NOTE — H&P (Addendum)
Date: 02/18/2019               Patient Name:  [name] MRN: [id number]  DOB: February 15, 1966 Age / Sex: 53 y.o., male   PCP: Staci Acosta, NP         Medical Service: Internal Medicine Teaching Service         Attending Physician: Dr. Evette Doffing, Mallie Mussel, *    First Contact: Dr. Laural Golden Pager: [phone]  Second Contact: Dr. Philipp Ovens Pager: [phone]       After Hours (After 5p/  First Contact Pager: [phone]  weekends / holidays): Second Contact Pager: [phone]   Chief Complaint: Altered Mental Status, hypoglycemia   History of Present Illness: [name] is a 53 year old man with morbid obesity, OSA, HTN, type II DM, CKD and CHF presenting with altered mental status. Patient reports he was in his usual state of health until yesterday morning. The last thing he remembers before waking up at [facility] ED was taking insulin yesterday morning and then preparing a sandwich, which is much less than he normally eats. Per EMS, he was last seen normal around 1:00 yesterday. His friends saw him yesterday and were checking in on him but when he did not answer his phone so EMS was called. He was found with his eyes deviated to the right, not responding but moaning and with a lot of oral secretions. He was not on any supplemental oxygen when he was found; he is normally on 3L supplemental oxygen. He was put on supplemental oxygen and his saturations did not improve so he was put on a NRB and mentation improved. Per EMS his blood sugar was wnl; however, on arrival to the ED, his BMP showed a glucose of 24 and he was given 50 mL dextrose 50%. Initially his sugars improved and then on recheck it was <10 so he was started on 100 mL dextrose 10% infusion.   He states he normally takes 90 units of lantus twice a day and 50 units TID with meals. He denies any recent changes to his insulin. He endorses feeling more fatigued for the past two weeks but denies any fevers, cough, nausea, vomiting or  abdominal pain. He states he has one similar episode similar to this due to low blood sugars.    Meds:  No current facility-administered medications on file prior to encounter.    Current Outpatient Medications on File Prior to Encounter  Medication Sig Dispense Refill  . albuterol (PROVENTIL HFA;VENTOLIN HFA) 108 (90 Base) MCG/ACT inhaler Inhale 2 puffs into the lungs every 4 (four) hours as needed for wheezing or shortness of breath. 6.7 g 5  . allopurinol (ZYLOPRIM) 300 MG tablet Take 1 tablet (300 mg total) by mouth daily. 90 tablet 4  . aspirin EC 81 MG tablet Take 1 tablet (81 mg total) by mouth daily. 30 tablet 3  . atorvastatin (LIPITOR) 40 MG tablet Take 1 tablet (40 mg total) by mouth at bedtime. 90 tablet 3  . carvedilol (COREG) 25 MG tablet Take 1 tablet (25 mg total) by mouth every 12 (twelve) hours. 180 tablet 3  . Cholecalciferol (D3-1000) 25 MCG (1000 UT) tablet Take 2 tablets (2,000 Units total) by mouth daily. 60 tablet 3  . cyclobenzaprine (FLEXERIL) 5 MG tablet Take 1 tablet (5 mg total) by mouth at bedtime as needed for muscle spasms. 30 tablet 0  . Dulaglutide (TRULICITY) 5.91 MB/8.4YK SOPN Inject 0.5 mg subcutaneously in the skin  once a week to help with diabetes and weight loss 12 pen 2  . furosemide (LASIX) 40 MG tablet Take 1.5 tablets (60 mg total) by mouth 2 (two) times daily. 90 tablet 3  . gabapentin (NEURONTIN) 300 MG capsule Take 2 capsules (600 mg total) by mouth 3 (three) times daily. 540 capsule 3  . glucose blood (PRECISION QID TEST) test strip by Other route Three (3) times a day with a meal.    . insulin glargine (LANTUS) 100 UNIT/ML injection Inject 0.9 mLs (90 Units total) into the skin 2 (two) times daily. 10 mL 10  . insulin regular (HUMULIN R) 100 units/mL injection INKECT 50 UNITS INTO THE SKIN 3 TIMES A DAY BEFORE MEALS 40 mL 10  . isosorbide mononitrate (IMDUR) 60 MG 24 hr tablet Take 1 tablet (60 mg total) by mouth daily. 30 tablet 3  . magnesium  oxide (MAG-OX) 400 MG tablet Take 1 tablet (400 mg total) by mouth daily. 30 tablet 3  . metolazone (ZAROXOLYN) 2.5 MG tablet Take 1 tablet (2.5 mg total) by mouth as needed. 20 tablet 2  . omeprazole (PRILOSEC) 20 MG capsule Take 1 capsule (20 mg total) by mouth 2 (two) times daily before a meal. 180 capsule 2  . triamcinolone cream (KENALOG) 0.1 % APPLY 1 APPLICATON TOPICALLY 2 TIMES A DAY. 454 g 0  . triamcinolone ointment (KENALOG) 0.5 % Apply 1 application topically 2 (two) times daily. 30 g 3  . umeclidinium-vilanterol (ANORO ELLIPTA) 62.5-25 MCG/INH AEPB Inhale 1 puff into the lungs daily. 1 each 5  . UNABLE TO FIND BIPAP at 22/15, I time of 1.2, rate of 12, volume ~700cc  for severe obstructive sleep apnea, heated humidification     No outpatient medications have been marked as taking for the 02/18/19 encounter [facility] Encounter).     Allergies: Allergies as of 02/18/2019 - Review Complete 02/18/2019  Allergen Reaction Noted  . Shellfish allergy Anaphylaxis 08/26/2018   Past Medical History:  Diagnosis Date  . Bell's palsy   . CHF (congestive heart failure) ([place])   . Chronic kidney disease   . Diabetes mellitus without complication ([place])   . Hypertension   . NSVT (nonsustained ventricular tachycardia) ([place])   . Obstructive sleep apnea   . Psoriasis   . Pulmonary HTN ([place])     Family History: Family History  Problem Relation Age of Onset  . Heart failure Mother   . Kidney failure Brother     Social History:  Patient lives alone in apartment and is able to perform all his ADL's. He has been on disability for the past two years due to his CHF. He does not drive anymore. He used to work in [employer] prior to disability.  He denies any tobacco, alcohol or illicit drug use.   Social History   Socioeconomic History  . Marital status: Single    Spouse name: Not on file  . Number of children: Not on file  . Years of education: Not on file  . Highest  education level: Not on file  Occupational History  . Not on file  Social Needs  . Financial resource strain: Somewhat hard  . Food insecurity:    Worry: Often true    Inability: Often true  . Transportation needs:    Medical: Yes    Non-medical: Yes  Tobacco Use  . Smoking status: Never Smoker  . Smokeless tobacco: Never Used  Substance and Sexual Activity  . Alcohol  use: Yes    Alcohol/week: 2.0 standard drinks    Types: 1 Shots of liquor, 1 Cans of beer per week    Comment: very rarely 1-2 times per month  . Drug use: No  . Sexual activity: Not on file  Lifestyle  . Physical activity:    Days per week: 0 days    Minutes per session: 0 min  . Stress: Rather much  Relationships  . Social connections:    Talks on phone: Not on file    Gets together: Not on file    Attends religious service: Not on file    Active member of club or organization: Not on file    Attends meetings of clubs or organizations: Not on file    Relationship status: Not on file  . Intimate partner violence:    Fear of current or ex partner: Not on file    Emotionally abused: Not on file    Physically abused: Not on file    Forced sexual activity: Not on file  Other Topics Concern  . Not on file  Social History Narrative  . Not on file   Review of Systems: A complete ROS was negative except as per HPI.   Physical Exam: Blood pressure (!) 173/106, pulse 95, temperature 98.1 F (36.7 C), temperature source Oral, resp. rate 20, height 5\' 3"  (1.6 m), weight (!) 138.9 kg, SpO2 92 %.  Physical Exam  Constitutional: He is oriented to person, place, and time and well-developed, well-nourished, and in no distress.  Morbidly obese  Neck:  Unable to assess JVD due to obesity  Cardiovascular: Normal rate, regular rhythm and normal heart sounds.  No murmur heard. Pulmonary/Chest: Effort normal. No respiratory distress.  Diffuse rhonchi, mild wheezing  Abdominal: Soft. Bowel sounds are normal. He  exhibits no distension. There is no abdominal tenderness.  Musculoskeletal:        General: Edema present. No tenderness.  Neurological: He is alert and oriented to person, place, and time.  Skin: Skin is warm and dry.  Extensive psoriasis extending from chest and abdomen to lower back and all four extremities, with some chronic venous stasis changes in bilateral LE   Psychiatric: Mood, affect and judgment normal.    EKG: personally reviewed my interpretation is sinus rhythm, prolonged QT interval   CXR: personally reviewed my interpretation is cardiomegaly, pulmonary vascular congestion  Assessment & Plan by Problem: Principal Problem:   Hypoglycemia Active Problems:   DM type 2 with diabetic peripheral neuropathy (HCC)   CHF (congestive heart failure) (HCC)   Altered mental status  Mr. Muhannad Bignell is a 53 year old man with morbid obesity, OSA, HTN, type II DM, CKD, ischemic cardiomyopathy and CHF presenting with altered mental status. He was found unresponsive in his apartment, without his supplemental oxygen, eyes deviated to the right and with a lot of oral secretions. He was found to be hypoglycemic and started on a D50 infusion.  Altered mental status Hypoglycemia  Type II DM Patient was last seen normal yesterday around 1:00 pm. He was found unresponsive, without his supplemental oxygen. His mentation improved some with NRB. On arrival to the ED he was found to be hypoglycemic at 24. Given 100 mL of D50%, initially his sugars improved but then on recheck his glucose was <10. He was started on a D10% infusion. He was most likely found to be hypoglycemic in the setting of taking his home insulin with less than normal PO intake. Unclear etiology  as to why he is remaining hypoglycemic. He is alert and oriented on exam and near his baseline.  - Continue dextrose 10% 100 ml/hr continuous  - CBG monitoring q2h - Cardiac monitoring  - Holding his home Lantus 90 units BID and Novolog  50 units TID with meals, and Trulicity  - Last G2B was 7.0  HFpEF Ischemic Cardiomyopathy  - Last echo 11/2016 showed an EF 45% with mild left ventricular systolic dysfunction with diffuse hypokinesis, grade 1 diasotlic dysfunction, trivial mitral and tricuspid regurgitation.  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  SignedMike Craze, DO 02/18/2019, 5:18 PM  Pager: [phone]

## 2019-02-18 NOTE — ED Notes (Signed)
ED TO INPATIENT HANDOFF REPORT  ED Nurse Name and Phone #: [phone]  S Name/Age/Gender [name] 53 y.o. male Room/Bed: 035C/035C  Code Status   Code Status: Prior  Home/SNF/Other Home Patient oriented to: self, place, time and situation Is this baseline? Yes   Triage Complete: Triage complete  Chief Complaint AMS  Triage Note Pt here via [facility] for altered mental status.  Pt lives at home, normally ao x 4.  Friends found him with R sided gaze.  Initial O2 sats were 70's that increased to 86% on nrb.  Pt normally wears 3L [place] and was not wearing O2.  After sats returned to 86%, pt became more responsive.  Sats now in 90's in nrb, copious amounts of clear secretions suctioned.  Pt speaking in one word phrases.   Allergies Allergies  Allergen Reactions  . Shellfish Allergy Anaphylaxis    Face and throat swelling, difficulty breathing Allergy can be triggered by touching (contact)    Level of Care/Admitting Diagnosis ED Disposition    ED Disposition Condition [facility]: [place] [[id number]]  Level of Care: Progressive [102]  Diagnosis: Hypoglycemia [967893]  Admitting Physician: Axel Filler [[id number]]  Attending Physician: Axel Filler [[id number]]  Estimated length of stay: past midnight tomorrow  Certification:: I certify this patient will need inpatient services for at least 2 midnights  PT Class (Do Not Modify): Inpatient [101]  PT Acc Code (Do Not Modify): Private [1]       B Medical/Surgery History Past Medical History:  Diagnosis Date  . Bell's palsy   . CHF (congestive heart failure) ([place])   . Chronic kidney disease   . Diabetes mellitus without complication ([place])   . Hypertension   . NSVT (nonsustained ventricular tachycardia) ([place])   . Obstructive sleep apnea   . Psoriasis   . Pulmonary HTN ([place])    History reviewed. No pertinent surgical history.   A IV  Location/Drains/Wounds Patient Lines/Drains/Airways Status   Active Line/Drains/Airways    Name:   Placement date:   Placement time:   Site:   Days:   Peripheral IV 02/18/19 Right Antecubital   02/18/19    1252    Antecubital   less than 1   Peripheral IV 02/18/19 Left Other (Comment)   02/18/19    1233    Other (Comment)   less than 1   Wound / Incision (Open or Dehisced) 11/20/16 Other (Comment) Leg Right;Lower small indention in calf   11/20/16    2300    Leg   820          Intake/Output Last 24 hours No intake or output data in the 24 hours ending 02/18/19 1739  Labs/Imaging Results for orders placed or performed during the hospital encounter of 02/18/19 (from the past 48 hour(s))  CBC with Differential/Platelet     Status: Abnormal   Collection Time: 02/18/19 12:39 PM  Result Value Ref Range   WBC 12.2 (H) 4.0 - 10.5 K/uL   RBC 4.59 4.22 - 5.81 MIL/uL   Hemoglobin 11.7 (L) 13.0 - 17.0 g/dL   HCT 41.4 39.0 - 52.0 %   MCV 90.2 80.0 - 100.0 fL   MCH 25.5 (L) 26.0 - 34.0 pg   MCHC 28.3 (L) 30.0 - 36.0 g/dL   RDW 20.3 (H) 11.5 - 15.5 %   Platelets 309 150 - 400 K/uL   nRBC 0.5 (H) 0.0 - 0.2 %  Neutrophils Relative % 83 %   Neutro Abs 10.0 (H) 1.7 - 7.7 K/uL   Lymphocytes Relative 11 %   Lymphs Abs 1.3 0.7 - 4.0 K/uL   Monocytes Relative 5 %   Monocytes Absolute 0.6 0.1 - 1.0 K/uL   Eosinophils Relative 1 %   Eosinophils Absolute 0.2 0.0 - 0.5 K/uL   Basophils Relative 0 %   Basophils Absolute 0.0 0.0 - 0.1 K/uL   Immature Granulocytes 0 %   Abs Immature Granulocytes 0.05 0.00 - 0.07 K/uL    Comment: Performed at [facility] [address] [place], [place] [address]  Comprehensive metabolic panel     Status: Abnormal   Collection Time: 02/18/19 12:43 PM  Result Value Ref Range   Sodium 143 135 - 145 mmol/L   Potassium 3.6 3.5 - 5.1 mmol/L   Chloride 98 98 - 111 mmol/L   CO2 35 (H) 22 - 32 mmol/L   Glucose, Bld 24 (LL) 70 - 99 mg/dL    Comment: CRITICAL  RESULT CALLED TO, READ BACK BY AND VERIFIED WITH: Bethena Midget RN [phone] [id number] BY A BENNETT    BUN 21 (H) 6 - 20 mg/dL   Creatinine, Ser 1.66 (H) 0.61 - 1.24 mg/dL   Calcium 9.4 8.9 - 10.3 mg/dL   Total Protein 8.4 (H) 6.5 - 8.1 g/dL   Albumin 3.3 (L) 3.5 - 5.0 g/dL   AST 19 15 - 41 U/L   ALT 17 0 - 44 U/L   Alkaline Phosphatase 133 (H) 38 - 126 U/L   Total Bilirubin 0.9 0.3 - 1.2 mg/dL   GFR calc non Af Amer 47 (L) >60 mL/min   GFR calc Af Amer 55 (L) >60 mL/min   Anion gap 10 5 - 15    Comment: Performed at [facility] [address] [place], [place] [address]  Troponin I - Once     Status: None   Collection Time: 02/18/19 12:43 PM  Result Value Ref Range   Troponin I <0.03 <0.03 ng/mL    Comment: Performed at [facility] [address], [place], [place] [address]  POCT I-Stat EG7     Status: Abnormal   Collection Time: 02/18/19 12:52 PM  Result Value Ref Range   pH, Ven 7.476 (H) 7.250 - 7.430   pCO2, Ven 52.7 44.0 - 60.0 mmHg   pO2, Ven 106.0 (H) 32.0 - 45.0 mmHg   Bicarbonate 38.9 (H) 20.0 - 28.0 mmol/L   TCO2 41 (H) 22 - 32 mmol/L   O2 Saturation 98.0 %   Acid-Base Excess 13.0 (H) 0.0 - 2.0 mmol/L   Sodium 138 135 - 145 mmol/L   Potassium 6.9 (HH) 3.5 - 5.1 mmol/L   Calcium, Ion 0.92 (L) 1.15 - 1.40 mmol/L   HCT 39.0 39.0 - 52.0 %   Hemoglobin 13.3 13.0 - 17.0 g/dL   Patient temperature HIDE    Sample type VENOUS    Comment NOTIFIED PHYSICIAN   Lactic acid, plasma     Status: None   Collection Time: 02/18/19 12:53 PM  Result Value Ref Range   Lactic Acid, Venous 1.2 0.5 - 1.9 mmol/L    Comment: Performed at [facility], [place] [address], [place],  [address]  Brain natriuretic peptide     Status: Abnormal   Collection Time: 02/18/19 12:53 PM  Result Value Ref Range   B Natriuretic Peptide 165.0 (H) 0.0 - 100.0 pg/mL  Comment: Performed at [facility], [place] [address], [place], [place] [address]  Protime-INR     Status: None    Collection Time: 02/18/19 12:54 PM  Result Value Ref Range   Prothrombin Time 14.3 11.4 - 15.2 seconds   INR 1.1 0.8 - 1.2    Comment: (NOTE) INR goal varies based on device and disease states. Performed at [facility], [place] [address], [place], [place] [address]   Influenza panel by PCR (type A & B)     Status: None   Collection Time: 02/18/19  1:20 PM  Result Value Ref Range   Influenza A By PCR NEGATIVE NEGATIVE   Influenza B By PCR NEGATIVE NEGATIVE    Comment: (NOTE) The Xpert Xpress Flu assay is intended as an aid in the diagnosis of  influenza and should not be used as a sole basis for treatment.  This  assay is FDA approved for nasopharyngeal swab specimens only. Nasal  washings and aspirates are unacceptable for Xpert Xpress Flu testing. Performed at [facility], [place] [address] [place], [place] [address]   CBG monitoring, ED     Status: Abnormal   Collection Time: 02/18/19  2:28 PM  Result Value Ref Range   Glucose-Capillary <10 (LL) 70 - 99 mg/dL  CBG monitoring, ED (now and then every hour for 3 hours)     Status: None   Collection Time: 02/18/19  2:36 PM  Result Value Ref Range   Glucose-Capillary 70 70 - 99 mg/dL  CBG monitoring, ED (now and then every hour for 3 hours)     Status: None   Collection Time: 02/18/19  3:01 PM  Result Value Ref Range   Glucose-Capillary 72 70 - 99 mg/dL  CBG monitoring, ED (now and then every hour for 3 hours)     Status: Abnormal   Collection Time: 02/18/19  3:27 PM  Result Value Ref Range   Glucose-Capillary 43 (LL) 70 - 99 mg/dL   Comment 1 Notify RN   CBG monitoring, ED     Status: Abnormal   Collection Time: 02/18/19  4:00 PM  Result Value Ref Range   Glucose-Capillary 102 (H) 70 - 99 mg/dL  Urinalysis, Routine w reflex microscopic     Status: None   Collection Time: 02/18/19  4:50 PM  Result Value Ref Range   Color, Urine YELLOW YELLOW   APPearance CLEAR CLEAR   Specific Gravity, Urine 1.016 1.005 - 1.030    pH 7.0 5.0 - 8.0   Glucose, UA NEGATIVE NEGATIVE mg/dL   Hgb urine dipstick NEGATIVE NEGATIVE   Bilirubin Urine NEGATIVE NEGATIVE   Ketones, ur NEGATIVE NEGATIVE mg/dL   Protein, ur NEGATIVE NEGATIVE mg/dL   Nitrite NEGATIVE NEGATIVE   Leukocytes,Ua NEGATIVE NEGATIVE    Comment: Performed at [facility], [address] [place],  [address]  CBG monitoring, ED     Status: Abnormal   Collection Time: 02/18/19  5:23 PM  Result Value Ref Range   Glucose-Capillary 124 (H) 70 - 99 mg/dL   Dg Chest Portable 1 View  Result Date: 02/18/2019 CLINICAL DATA:  Respiratory distress.  History of CHF. EXAM: PORTABLE CHEST 1 VIEW COMPARISON:  Chest radiograph January 14, 2018 FINDINGS: Stable cardiomegaly. Similarly widened mediastinum attributable to AP technique. Pulmonary vascular congestion without pleural effusion or focal consolidation. Persistently elevated RIGHT hemidiaphragm. No pneumothorax. Soft tissue planes and included osseous structures are non suspicious. Large body habitus. IMPRESSION: 1.  Stable cardiomegaly and pulmonary vascular congestion. Electronically Signed   By: Elon Alas M.D.   On: 02/18/2019 13:27    Pending Labs Unresulted Labs (From admission, onward)    Start     Ordered   02/18/19 1253  Lactic acid, plasma  Now then every 2 hours,   STAT     02/18/19 1253   02/18/19 1253  Urine rapid drug screen (hosp performed)  ONCE - STAT,   STAT     02/18/19 1253   02/18/19 1243  CBC with Differential  ONCE - STAT,   STAT     02/18/19 1243   Signed and Held  HIV antibody (Routine Testing)  Once,   R     Signed and Held   Signed and Held  Basic metabolic panel  Tomorrow morning,   R     Signed and Held   Signed and Held  CBC  Tomorrow morning,   R     Signed and Held          Vitals/Pain Today's Vitals   02/18/19 1639 02/18/19 1645 02/18/19 1700 02/18/19 1715  BP:    (!) 172/114  Pulse:  95 91 95  Resp:    20  Temp: 98.1 F (36.7 C)     TempSrc:  Oral     SpO2:  (!) 88% (!) 82% 92%  Weight:      Height:      PainSc:        Isolation Precautions No active isolations  Medications Medications  sodium chloride flush (NS) 0.9 % injection 3 mL (has no administration in time range)  dextrose 10 % infusion (has no administration in time range)  acetaminophen (TYLENOL) tablet 650 mg (has no administration in time range)    Or  acetaminophen (TYLENOL) suppository 650 mg (has no administration in time range)  albuterol (PROVENTIL) (2.5 MG/3ML) 0.083% nebulizer solution 5 mg (5 mg Nebulization Given 02/18/19 1315)  dextrose 50 % solution (50 mLs  Given 02/18/19 1431)  furosemide (LASIX) injection 40 mg (40 mg Intravenous Given 02/18/19 1637)  dextrose 50 % solution (50 mLs  Given 02/18/19 1535)    Mobility walks with person assist     Focused Assessments Neuro Assessment Handoff:  Swallow screen pass? Yes  Cardiac Rhythm: Normal sinus rhythm       Neuro Assessment: Exceptions to WDL Neuro Checks:      Last Documented NIHSS Modified Score:   Has TPA been given? No If patient is a Neuro Trauma and patient is going to OR before floor call report to [place] nurse: [phone] or [phone]     R Recommendations: See Admitting Provider Note  Report given to:   Additional Notes:

## 2019-02-18 NOTE — Progress Notes (Signed)
RT obtained ABG from pt with the following results. RN notified of pts critical PCO2 of 70.5. RT will continue to monitor. Pt is being transported to 6E20. Receiving RT has been given report.     Ref. Range 02/18/2019 19:56  Sample type Unknown ARTERIAL  pH, Arterial Latest Ref Range: 7.350 - 7.450  7.337 (L)  pCO2 arterial Latest Ref Range: 32.0 - 48.0 mmHg 70.5 (HH)  pO2, Arterial Latest Ref Range: 83.0 - 108.0 mmHg 106.0  TCO2 Latest Ref Range: 22 - 32 mmol/L 40 (H)  Acid-Base Excess Latest Ref Range: 0.0 - 2.0 mmol/L 9.0 (H)  Bicarbonate Latest Ref Range: 20.0 - 28.0 mmol/L 37.9 (H)  O2 Saturation Latest Units: % 97.0  Patient temperature Unknown 98.1 F  Collection site Unknown RADIAL, ALLEN'S TEST ACCEPTABLE

## 2019-02-18 NOTE — ED Notes (Addendum)
Pt tolerating high flow [place] at 15L well at this time, spo2 100%. Pt endorses chest discomfort. Pt still having snoring respirations but alert. Orientation in question. Pt constantly salivating and it is running down his face. Constantly being suctioned. Airway remains intact.

## 2019-02-18 NOTE — ED Notes (Signed)
Lab called this Rn with CBG of 24. Dr Regenia Skeeter notified and this RN getting amp of D50 to give to patient. CBG rechecked and reading "low"

## 2019-02-18 NOTE — ED Notes (Signed)
Pt now responsive, alert and oriented x 4. Following all commands.

## 2019-02-18 NOTE — ED Notes (Signed)
Pt state that he is diabetic and takes humulin R at home. Remembers taking it this morning but cannot remember what he ate for breakfast. Pt educated to why he is here.

## 2019-02-18 NOTE — ED Notes (Signed)
Pt continues to remain hypoxic. RRT called to come re-eval pt. Increased pt oxygen to 30L and 100%. Pt remains alert and oriented at this time. Admitting paged at this time.

## 2019-02-18 NOTE — ED Notes (Signed)
RT unable to obtain ABG at this time. ?

## 2019-02-18 NOTE — Progress Notes (Signed)
Presented to bedside to evaluate patient. He is resting comfortably on BiPAP. He is alert and oriented x 3. He is asking if he can have some food or something to drink. He is getting ready to go to [place].   His CBG continue to improve on IV dextrose, we will plan to stop this once his CBG has stabilized and he is able to eat. He has diuresed >2 L since receiving the lasix. Repeat ABG illustrated a mild acute on chronic hypercarbic respiratory failure but given his elevated bicarb he is close to baseline. We discussed continuing BiPAP for now and will do a trial off within the next hour. At that point if he is able to maintain his oxygenation and mental status we will allow him to [place].

## 2019-02-18 NOTE — Progress Notes (Addendum)
Paged by RN due to patient having worsening mental status and respiratory function on high flow nasal cannula with an FiO2 100%. Patient was reevaluated in the ED and at this time placed on BiPAP and his saturations improved. He was alert and oriented. He states he required intubation in the past at [facility]. He is still amenable to intubation if necessary.   Vitals:   02/18/19 1815 02/18/19 1833  BP: (!) 150/74   Pulse: (!) 105 87  Resp:  (!) 40  Temp:    SpO2: (!) 53% 100%    Gen: alert and oriented, breathing improved on BiPAP Pulm: diffuse rhonchi, mild wheezing   Assessment/Plan: Patient with worsening respiratory status in the setting of pulmonary edema, HFpEF and chronic hypercapnic respiratory failure off his home oxygen for the past two days. He received 50 ml of D50 when he first arrived and was just started on 147ml/hr of D10, unlikely contributing to volume status. He missed two days of his home diuretics, off his home supplemental oxygen and hypoglycemic which may all be contributing to his acute on chronic respiratory failure. Will order another dose of IV furosemide 40 mg and continue BiPAP. He has already improved on BiPAP but if his respiratory status continues to worsen may need to consult PCCM and consider intubation. - IV furosemide 40 mg  - BiPAP prn

## 2019-02-18 NOTE — ED Provider Notes (Signed)
[name] Provider Note   CSN: [id number] Arrival date & time: 02/18/19  1225  [place]  History   Chief Complaint Chief Complaint  Patient presents with  . Altered Mental Status    HPI [name] is a 53 y.o. male.     HPI  53 year old male presents with altered mental status and trouble breathing.  The history is very limited as the patient repeats what he is saying and thus EMS provides the history.  They were called out because the patient did not answer his phone and was last seen normal yesterday around 1:00.  The patient lives at home alone and apparently functions at home alone.  Friend saw him yesterday and were checking in on him but he did not answer the phone so EMS was called.  Patient was found with his eyes deviated to the right and not responding but moaning.  His oxygen which she is normally on 3 L was not on him.  The patient has started to talk to them a little bit now that he is been on the nonrebreather.  Patient repeats some of the things I am saying and at times only answers in one-word questions but the history is extremely limited.  It is unknown what his normal mental baseline is or if he has any mental delay. Glucose 191 on EMS fingerstick.  Past Medical History:  Diagnosis Date  . Bell's palsy   . CHF (congestive heart failure) ([place])   . Chronic kidney disease   . Diabetes mellitus without complication ([place])   . Hypertension   . NSVT (nonsustained ventricular tachycardia) ([place])   . Obstructive sleep apnea   . Psoriasis   . Pulmonary HTN [facility])     Patient Active Problem List   Diagnosis Date Noted  . RLQ abdominal mass 01/20/2019  . CHF (congestive heart failure) ([place]) 10/07/2018  . Muscle cramps 10/07/2018  . Morbid obesity ([place]) 06/24/2018  . Chronic diastolic heart failure ([place]) 02/10/2018  . Obstructive sleep apnea 02/10/2018  . Diabetes mellitus without complication  ([place]) 40/98/1191  . CAD (coronary artery disease) 01/31/2017  . Hyperlipidemia, unspecified 01/31/2017  . Essential hypertension 01/17/2017  . Psoriasis 01/17/2017  . Chronic gout of multiple sites 12/21/2016  . DM type 2 with diabetic peripheral neuropathy ([place]) 06/25/2016  . Coronary artery disease involving native coronary artery 06/14/2015  . Cardiomyopathy ([place]) 08/19/2012  . CKD (chronic kidney disease) stage 3, GFR 30-59 ml/min (HCC) 08/19/2012    History reviewed. No pertinent surgical history.      Home Medications    Prior to Admission medications   Medication Sig Start Date End Date Taking? Authorizing Provider  albuterol (PROVENTIL HFA;VENTOLIN HFA) 108 (90 Base) MCG/ACT inhaler Inhale 2 puffs into the lungs every 4 (four) hours as needed for wheezing or shortness of breath. 01/20/19   Doles-Johnson, Teah, NP  allopurinol (ZYLOPRIM) 300 MG tablet Take 1 tablet (300 mg total) by mouth daily. 01/20/19   Doles-Johnson, Teah, NP  aspirin EC 81 MG tablet Take 1 tablet (81 mg total) by mouth daily. 10/07/18   Doles-Johnson, Teah, NP  atorvastatin (LIPITOR) 40 MG tablet Take 1 tablet (40 mg total) by mouth at bedtime. 01/20/19   Doles-Johnson, Teah, NP  carvedilol (COREG) 25 MG tablet Take 1 tablet (25 mg total) by mouth every 12 (twelve) hours. 01/20/19   Doles-Johnson, Teah, NP  Cholecalciferol (D3-1000) 25 MCG (1000 UT) tablet Take 2  tablets (2,000 Units total) by mouth daily. 01/20/19   Doles-Johnson, Teah, NP  cyclobenzaprine (FLEXERIL) 5 MG tablet Take 1 tablet (5 mg total) by mouth at bedtime as needed for muscle spasms. 01/20/19   Doles-Johnson, Teah, NP  Dulaglutide (TRULICITY) 2.54 YH/0.6CB SOPN Inject 0.5 mg subcutaneously in the skin once a week to help with diabetes and weight loss 01/20/19   Doles-Johnson, Teah, NP  furosemide (LASIX) 40 MG tablet Take 1.5 tablets (60 mg total) by mouth 2 (two) times daily. 10/07/18   Doles-Johnson, Teah, NP  gabapentin (NEURONTIN) 300 MG capsule  Take 2 capsules (600 mg total) by mouth 3 (three) times daily. 01/20/19   Doles-Johnson, Teah, NP  glucose blood (PRECISION QID TEST) test strip by Other route Three (3) times a day with a meal. 04/03/17   [provider]  insulin glargine (LANTUS) 100 UNIT/ML injection Inject 0.9 mLs (90 Units total) into the skin 2 (two) times daily. 01/20/19   Doles-Johnson, Teah, NP  insulin regular (HUMULIN R) 100 units/mL injection INKECT 50 UNITS INTO THE SKIN 3 TIMES A DAY BEFORE MEALS 01/20/19   Doles-Johnson, Teah, NP  isosorbide mononitrate (IMDUR) 60 MG 24 hr tablet Take 1 tablet (60 mg total) by mouth daily. 01/20/19   Doles-Johnson, Teah, NP  magnesium oxide (MAG-OX) 400 MG tablet Take 1 tablet (400 mg total) by mouth daily. 01/20/19   Doles-Johnson, Teah, NP  metolazone (ZAROXOLYN) 2.5 MG tablet Take 1 tablet (2.5 mg total) by mouth as needed. 01/20/19   Doles-Johnson, Teah, NP  omeprazole (PRILOSEC) 20 MG capsule Take 1 capsule (20 mg total) by mouth 2 (two) times daily before a meal. 01/20/19   Doles-Johnson, Teah, NP  triamcinolone cream (KENALOG) 0.1 % APPLY 1 APPLICATON TOPICALLY 2 TIMES A DAY. 02/12/19   Tawni Millers, MD  triamcinolone ointment (KENALOG) 0.5 % Apply 1 application topically 2 (two) times daily. 01/20/19   Doles-Johnson, Teah, NP  umeclidinium-vilanterol (ANORO ELLIPTA) 62.5-25 MCG/INH AEPB Inhale 1 puff into the lungs daily. 09/05/18   Laverle Hobby, MD  UNABLE TO FIND BIPAP at 22/15, I time of 1.2, rate of 12, volume ~700cc  for severe obstructive sleep apnea, heated humidification 05/06/18   [provider]    Family History Family History  Problem Relation Age of Onset  . Heart failure Mother   . Kidney failure Brother     Social History Social History   Tobacco Use  . Smoking status: Never Smoker  . Smokeless tobacco: Never Used  Substance Use Topics  . Alcohol use: Yes    Alcohol/week: 2.0 standard drinks    Types: 1 Shots of liquor, 1 Cans of beer per  week    Comment: very rarely 1-2 times per month  . Drug use: No     Allergies   Shellfish allergy   Review of Systems Review of Systems  Unable to perform ROS: Mental status change     Physical Exam Updated Vital Signs BP (!) 173/106   Pulse 78   Temp (!) 95 F (35 C) (Temporal)   Resp 19   Ht 5\' 3"  (1.6 m)   Wt (!) 138.9 kg   SpO2 90%   BMI 54.24 kg/m   Physical Exam Vitals signs and nursing note reviewed.  Constitutional:      Appearance: He is well-developed.     Comments: Morbidly obese Patient is frequently having clear secretions out of his mouth.  He talks to me some but often repeats the same  word.  HENT:     Head: Normocephalic and atraumatic.     Right Ear: External ear normal.     Left Ear: External ear normal.     Nose: Nose normal.  Eyes:     General:        Right eye: No discharge.        Left eye: No discharge.  Neck:     Musculoskeletal: Neck supple.  Cardiovascular:     Rate and Rhythm: Normal rate and regular rhythm.     Heart sounds: Normal heart sounds.  Pulmonary:     Effort: Tachypnea present. No accessory muscle usage.     Breath sounds: Transmitted upper airway sounds present. Rhonchi present.     Comments: Diffuse rhonchi and may be some wheezes, a lot of this seems to be upper airway noise but is hard to tell. Abdominal:     Palpations: Abdomen is soft.     Tenderness: There is no abdominal tenderness.     Comments: Mild generalized tenderness, abdominal wall feels like it has some edema  Musculoskeletal:     Right lower leg: Edema present.     Left lower leg: Edema present.     Comments: Bilateral lower extremities are swollen though unclear how chronic or acute this is.  Skin:    General: Skin is warm and dry.  Neurological:     Mental Status: He is alert.     Comments: Does not follow commands well but is awake, alert, and responds to his name.  Moves all 4 extremities.  Psychiatric:        Mood and Affect: Mood is not  anxious.      ED Treatments / Results  Labs (all labs ordered are listed, but only abnormal results are displayed) Labs Reviewed  COMPREHENSIVE METABOLIC PANEL - Abnormal; Notable for the following components:      Result Value   CO2 35 (*)    Glucose, Bld 24 (*)    BUN 21 (*)    Creatinine, Ser 1.66 (*)    Total Protein 8.4 (*)    Albumin 3.3 (*)    Alkaline Phosphatase 133 (*)    GFR calc non Af Amer 47 (*)    GFR calc Af Amer 55 (*)    All other components within normal limits  BRAIN NATRIURETIC PEPTIDE - Abnormal; Notable for the following components:   B Natriuretic Peptide 165.0 (*)    All other components within normal limits  CBC WITH DIFFERENTIAL/PLATELET - Abnormal; Notable for the following components:   WBC 12.2 (*)    Hemoglobin 11.7 (*)    MCH 25.5 (*)    MCHC 28.3 (*)    RDW 20.3 (*)    nRBC 0.5 (*)    Neutro Abs 10.0 (*)    All other components within normal limits  CBG MONITORING, ED - Abnormal; Notable for the following components:   Glucose-Capillary <10 (*)    All other components within normal limits  POCT I-STAT EG7 - Abnormal; Notable for the following components:   pH, Ven 7.476 (*)    pO2, Ven 106.0 (*)    Bicarbonate 38.9 (*)    TCO2 41 (*)    Acid-Base Excess 13.0 (*)    Potassium 6.9 (*)    Calcium, Ion 0.92 (*)    All other components within normal limits  CBG MONITORING, ED - Abnormal; Notable for the following components:   Glucose-Capillary 43 (*)    All  other components within normal limits  CBG MONITORING, ED - Abnormal; Notable for the following components:   Glucose-Capillary 102 (*)    All other components within normal limits  LACTIC ACID, PLASMA  TROPONIN I  PROTIME-INR  INFLUENZA PANEL BY PCR (TYPE A & B)  CBC WITH DIFFERENTIAL/PLATELET  LACTIC ACID, PLASMA  URINALYSIS, ROUTINE W REFLEX MICROSCOPIC  RAPID URINE DRUG SCREEN, HOSP PERFORMED  I-STAT CREATININE, ED  CBG MONITORING, ED  CBG MONITORING, ED    EKG EKG  Interpretation  Date/Time:  Wednesday February 18 2019 12:32:49 EST Ventricular Rate:  73 PR Interval:    QRS Duration: 114 QT Interval:  482 QTC Calculation: 532 R Axis:   -20 Text Interpretation:  Sinus rhythm Borderline intraventricular conduction delay Prolonged QT interval Confirmed by Sherwood Gambler [phone]) on 02/18/2019 1:13:34 PM   Radiology Dg Chest Portable 1 View  Result Date: 02/18/2019 CLINICAL DATA:  Respiratory distress.  History of CHF. EXAM: PORTABLE CHEST 1 VIEW COMPARISON:  Chest radiograph January 14, 2018 FINDINGS: Stable cardiomegaly. Similarly widened mediastinum attributable to AP technique. Pulmonary vascular congestion without pleural effusion or focal consolidation. Persistently elevated RIGHT hemidiaphragm. No pneumothorax. Soft tissue planes and included osseous structures are non suspicious. Large body habitus. IMPRESSION: 1. Stable cardiomegaly and pulmonary vascular congestion. Electronically Signed   By: Elon Alas M.D.   On: 02/18/2019 13:27    Procedures .Critical Care Performed by: Sherwood Gambler, MD Authorized by: Sherwood Gambler, MD   Critical care provider statement:    Critical care time (minutes):  45   Critical care time was exclusive of:  Separately billable procedures and treating other patients   Critical care was necessary to treat or prevent imminent or life-threatening deterioration of the following conditions:  Respiratory failure, CNS failure or compromise and metabolic crisis   Critical care was time spent personally by me on the following activities:  Development of treatment plan with patient or surrogate, discussions with consultants, evaluation of patient's response to treatment, examination of patient, obtaining history from patient or surrogate, ordering and performing treatments and interventions, ordering and review of laboratory studies, ordering and review of radiographic studies, pulse oximetry, re-evaluation of patient's  condition and review of old charts   (including critical care time)  Angiocath insertion Performed by: Ephraim Hamburger  Consent: Verbal consent obtained. Risks and benefits: risks, benefits and alternatives were discussed Time out: Immediately prior to procedure a "time out" was called to verify the correct patient, procedure, equipment, support staff and site/side marked as required.  Preparation: Patient was prepped and draped in the usual sterile fashion.  Vein Location: right AC  Ultrasound Guided  Gauge: 20  Normal blood return and flush without difficulty Patient tolerance: Patient tolerated the procedure well with no immediate complications.    Medications Ordered in ED Medications  sodium chloride flush (NS) 0.9 % injection 3 mL (has no administration in time range)  furosemide (LASIX) injection 40 mg (has no administration in time range)  dextrose 10 % infusion (has no administration in time range)  albuterol (PROVENTIL) (2.5 MG/3ML) 0.083% nebulizer solution 5 mg (5 mg Nebulization Given 02/18/19 1315)  dextrose 50 % solution (50 mLs  Given 02/18/19 1431)  dextrose 50 % solution (50 mLs  Given 02/18/19 1535)     Initial Impression / Assessment and Plan / ED Course  I have reviewed the triage vital signs and the nursing notes.  Pertinent labs & imaging results that were available during my care of the  patient were reviewed by me and considered in my medical decision making (see chart for details).        At first patient was worked up for unclear altered mental status.  He was hypoxic and placed on high flow nasal cannula.  EMS had reported normal to high glucose.  However eventually his glucose had been checked here and was low.  After being given D50 he is now back to his normal baseline and is not altered.  He states he is not sure why his sugar dropped as he was eating a sandwich after he had given himself insulin.  He has not been sick recently.  I do think he is  somewhat volume overloaded based on work-up and he will be given IV Lasix given his continued increased O2 requirement.  While he is not like he was before, his glucose did drop again, despite the previous D50 and him eating food at the bedside.  He was given further D50 and will be placed on D10 drip.  Internal medicine teaching service has been consulted for admission and he will be admitted to the stepdown unit.  Head CT was canceled now that he is back to baseline and well-appearing.  Final Clinical Impressions(s) / ED Diagnoses   Final diagnoses:  Hypoglycemia    ED Discharge Orders    None       Sherwood Gambler, MD 02/18/19 1616

## 2019-02-18 NOTE — Progress Notes (Signed)
RT unable to obtain ABG after attempt with a doppler. RN is aware and will notify MD.

## 2019-02-18 NOTE — ED Notes (Signed)
CBG-70 

## 2019-02-18 NOTE — ED Triage Notes (Signed)
Pt here via [facility] for altered mental status.  Pt lives at home, normally ao x 4.  Friends found him with R sided gaze.  Initial O2 sats were 70's that increased to 86% on nrb.  Pt normally wears 3L [place] and was not wearing O2.  After sats returned to 86%, pt became more responsive.  Sats now in 90's in nrb, copious amounts of clear secretions suctioned.  Pt speaking in one word phrases.

## 2019-02-18 NOTE — ED Notes (Addendum)
PLEASE NOTE: CBG MACHINE RECORDED 102 AT 16:05 BUT SHOWS UP IN THIS PATIENT'S CHART AS <10 AT 16:05. RN, Hassan Rowan aware.

## 2019-02-19 ENCOUNTER — Other Ambulatory Visit: Payer: Self-pay

## 2019-02-19 ENCOUNTER — Encounter (HOSPITAL_COMMUNITY): Payer: Self-pay | Admitting: General Practice

## 2019-02-19 DIAGNOSIS — E1165 Type 2 diabetes mellitus with hyperglycemia: Secondary | ICD-10-CM

## 2019-02-19 DIAGNOSIS — J9612 Chronic respiratory failure with hypercapnia: Secondary | ICD-10-CM

## 2019-02-19 LAB — BASIC METABOLIC PANEL
Anion gap: 9 (ref 5–15)
Anion gap: 17 — ABNORMAL HIGH (ref 5–15)
BUN: 20 mg/dL (ref 6–20)
BUN: 20 mg/dL (ref 6–20)
CO2: 35 mmol/L — ABNORMAL HIGH (ref 22–32)
CO2: 30 mmol/L (ref 22–32)
Calcium: 9.2 mg/dL (ref 8.9–10.3)
Calcium: 9.3 mg/dL (ref 8.9–10.3)
Chloride: 97 mmol/L — ABNORMAL LOW (ref 98–111)
Chloride: 94 mmol/L — ABNORMAL LOW (ref 98–111)
Creatinine, Ser: 1.55 mg/dL — ABNORMAL HIGH (ref 0.61–1.24)
Creatinine, Ser: 1.62 mg/dL — ABNORMAL HIGH (ref 0.61–1.24)
GFR calc Af Amer: 60 mL/min — ABNORMAL LOW (ref >60)
GFR calc Af Amer: 57 mL/min — ABNORMAL LOW (ref >60)
GFR calc non Af Amer: 51 mL/min — ABNORMAL LOW (ref >60)
GFR calc non Af Amer: 49 mL/min — ABNORMAL LOW (ref >60)
Glucose, Bld: 58 mg/dL — ABNORMAL LOW (ref 70–99)
Glucose, Bld: 226 mg/dL — ABNORMAL HIGH (ref 70–99)
Potassium: 4.5 mmol/L (ref 3.5–5.1)
Potassium: 4.7 mmol/L (ref 3.5–5.1)
Sodium: 141 mmol/L (ref 135–145)
Sodium: 141 mmol/L (ref 135–145)

## 2019-02-19 LAB — GLUCOSE, CAPILLARY
Glucose-Capillary: 118 mg/dL — ABNORMAL HIGH (ref 70–99)
Glucose-Capillary: 125 mg/dL — ABNORMAL HIGH (ref 70–99)
Glucose-Capillary: 156 mg/dL — ABNORMAL HIGH (ref 70–99)
Glucose-Capillary: 189 mg/dL — ABNORMAL HIGH (ref 70–99)
Glucose-Capillary: 216 mg/dL — ABNORMAL HIGH (ref 70–99)
Glucose-Capillary: 270 mg/dL — ABNORMAL HIGH (ref 70–99)
Glucose-Capillary: 284 mg/dL — ABNORMAL HIGH (ref 70–99)
Glucose-Capillary: 46 mg/dL — ABNORMAL LOW (ref 70–99)
Glucose-Capillary: 70 mg/dL (ref 70–99)
Glucose-Capillary: 91 mg/dL (ref 70–99)

## 2019-02-19 LAB — CBC
HCT: 35.7 % — ABNORMAL LOW (ref 39.0–52.0)
Hemoglobin: 10.2 g/dL — ABNORMAL LOW (ref 13.0–17.0)
MCH: 24.5 pg — ABNORMAL LOW (ref 26.0–34.0)
MCHC: 28.6 g/dL — ABNORMAL LOW (ref 30.0–36.0)
MCV: 85.8 fL (ref 80.0–100.0)
Platelets: 233 10*3/uL (ref 150–400)
RBC: 4.16 MIL/uL — ABNORMAL LOW (ref 4.22–5.81)
RDW: 19.4 % — ABNORMAL HIGH (ref 11.5–15.5)
WBC: 16.3 10*3/uL — ABNORMAL HIGH (ref 4.0–10.5)
nRBC: 0.3 % — ABNORMAL HIGH (ref 0.0–0.2)

## 2019-02-19 LAB — LACTIC ACID, PLASMA: Lactic Acid, Venous: 1.4 mmol/L (ref 0.5–1.9)

## 2019-02-19 MED ORDER — INSULIN ASPART 100 UNIT/ML ~~LOC~~ SOLN
0.0000 [IU] | Freq: Three times a day (TID) | SUBCUTANEOUS | Status: DC
  Administered 2019-02-19: 4 [IU] via SUBCUTANEOUS
  Administered 2019-02-19: 11 [IU] via SUBCUTANEOUS
  Administered 2019-02-20: 7 [IU] via SUBCUTANEOUS
  Administered 2019-02-20: 4 [IU] via SUBCUTANEOUS

## 2019-02-19 MED ORDER — FUROSEMIDE 10 MG/ML IJ SOLN
40.0000 mg | Freq: Once | INTRAMUSCULAR | Status: AC
  Administered 2019-02-19: 40 mg via INTRAVENOUS
  Filled 2019-02-19: qty 4

## 2019-02-19 MED ORDER — DEXTROSE 50 % IV SOLN
INTRAVENOUS | Status: AC
  Filled 2019-02-19: qty 50

## 2019-02-19 MED ORDER — TRIAMCINOLONE ACETONIDE 0.5 % EX CREA
TOPICAL_CREAM | Freq: Two times a day (BID) | CUTANEOUS | Status: DC
  Administered 2019-02-19 – 2019-02-20 (×3): via TOPICAL
  Filled 2019-02-19: qty 45
  Filled 2019-02-19: qty 15
  Filled 2019-02-19 (×2): qty 45

## 2019-02-19 MED ORDER — GLUCOSE 4 G PO CHEW
1.0000 | CHEWABLE_TABLET | Freq: Once | ORAL | Status: DC
  Filled 2019-02-19: qty 1

## 2019-02-19 MED ORDER — INSULIN GLARGINE 100 UNIT/ML ~~LOC~~ SOLN
45.0000 [IU] | Freq: Once | SUBCUTANEOUS | Status: AC
  Administered 2019-02-19: 45 [IU] via SUBCUTANEOUS
  Filled 2019-02-19: qty 0.45

## 2019-02-19 MED ORDER — INSULIN GLARGINE 100 UNIT/ML ~~LOC~~ SOLN
45.0000 [IU] | Freq: Every day | SUBCUTANEOUS | Status: DC
  Filled 2019-02-19: qty 0.45

## 2019-02-19 MED ORDER — INSULIN ASPART 100 UNIT/ML ~~LOC~~ SOLN
25.0000 [IU] | Freq: Three times a day (TID) | SUBCUTANEOUS | Status: DC
  Administered 2019-02-20 – 2019-02-21 (×2): 25 [IU] via SUBCUTANEOUS

## 2019-02-19 MED ORDER — GLUCOSE 40 % PO GEL
ORAL | Status: AC
  Administered 2019-02-19: 37.5 g
  Filled 2019-02-19: qty 1

## 2019-02-19 NOTE — Progress Notes (Signed)
Inpatient Diabetes Program Recommendations  AACE/ADA: New Consensus Statement on Inpatient Glycemic Control (2015)  Target Ranges:  Prepandial:   less than 140 mg/dL      Peak postprandial:   less than 180 mg/dL (1-2 hours)      Critically ill patients:  140 - 180 mg/dL   Lab Results  Component Value Date   GLUCAP 270 (H) 02/19/2019   HGBA1C 7.9 (H) 10/08/2018   Spoke with patient in regards to DM management at home. [facility] [place], [place]. Last time he went to the clinic was Wednesday, patient stated no changes were made at that time. Patient reports he does not have that many lows at home.   A1c 7.9% on 09/2018  Patient says he eats a lot to support the insulin doses he takes at home. Spoke to patient about lower doses at time of d/c. Spoke with patient that high insulin doses can also make you gain weigh, which we want to avoid. Discussed w/ patient that MD would adjust doses at time of d/c. Patient is very grateful.  Thanks,  Tama Headings RN, MSN, BC-ADM Inpatient Diabetes Coordinator Team Pager [phone] (8a-5p)

## 2019-02-19 NOTE — Progress Notes (Signed)
   Subjective: [name] reports feeling much better this morning. He felt his breathing was at his baseline. He states he normally eats a lot more for breakfast than he ate on Tuesday. He thinks that is what led to his low sugars. He states this has never happened before and usually his sugars run high. He states he feels his legs are more swollen than usual today. He has no other acute complaints at this time. We discussed starting half of his insulin dose today if his sugars remain elevated. All questions and concerns were addressed.   Objective:  Vital signs in last 24 hours: Vitals:   02/18/19 2320 02/18/19 2354 02/19/19 0425 02/19/19 0430  BP:  (!) 160/81 (!) 175/86   Pulse: 88  86 91  Resp: 16 (!) 28 (!) 23 18  Temp:  98.2 F (36.8 C) 97.9 F (36.6 C)   TempSrc:  Axillary Oral   SpO2: 96% 100% 95% 92%  Weight:   (!) 138.2 kg   Height:   5\' 3"  (1.6 m)    Gen: seen comfortably resting on BiPAP, no distress Ext: bilateral LE edema, no tenderness  Skin: chronic venous stasis changes in bilateral LE, extensive psoriasis, warm, dry Neuro: alert and oriented, sensation intact   Assessment/Plan:  Principal Problem:   Hypoglycemia Active Problems:   DM type 2 with diabetic peripheral neuropathy (HCC)   CHF (congestive heart failure) (HCC)   Altered mental status  [name] is a 53 year old man with morbid obesity, OSA, HTN, type II DM, CKD, ischemic cardiomyopathy and CHF presenting with altered mental status. He was found unresponsive in his apartment, without his supplemental oxygen, eyes deviated to the right and with a lot of oral secretions. He was found to be hypoglycemic and started on a D50 infusion.  Altered mental status Hypoglycemia  Type II DM - Blood sugars 216 this morning, D10 was stopped. Rechecking his CBG q2h and will determine if he needs insulin   - If his sugars remain elevated will restart him on half his home insulin doses - If his  hypoglycemia persists can consider checking hypoglycemic labs to make sure hypoglycemia is 2/2 exogenous insulin use. 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  Mike Craze, DO 02/19/2019, 7:01 AM Pager: [phone]

## 2019-02-19 NOTE — Plan of Care (Signed)
  Problem: Education: Goal: Knowledge of General Education information will improve Description Including pain rating scale, medication(s)/side effects and non-pharmacologic comfort measures Outcome: Progressing   

## 2019-02-19 NOTE — Progress Notes (Signed)
Placed patient on BiPAP per patient request. Patient is wanting to nap. RT will continue to monitor.

## 2019-02-19 NOTE — Progress Notes (Signed)
Held meal coverage of 25 units for lunch for CBG 270 considering his hx and amount of food he consumes at home based on pt's report.  CBG before dinner was 156.  Will again hold 25 units of meal coverage.  Idolina Primer, RN

## 2019-02-20 DIAGNOSIS — R4182 Altered mental status, unspecified: Secondary | ICD-10-CM

## 2019-02-20 LAB — BASIC METABOLIC PANEL
Anion gap: 8 (ref 5–15)
BUN: 20 mg/dL (ref 6–20)
CO2: 33 mmol/L — ABNORMAL HIGH (ref 22–32)
Calcium: 8.8 mg/dL — ABNORMAL LOW (ref 8.9–10.3)
Chloride: 96 mmol/L — ABNORMAL LOW (ref 98–111)
Creatinine, Ser: 1.62 mg/dL — ABNORMAL HIGH (ref 0.61–1.24)
GFR calc Af Amer: 57 mL/min — ABNORMAL LOW (ref >60)
GFR calc non Af Amer: 49 mL/min — ABNORMAL LOW (ref >60)
Glucose, Bld: 192 mg/dL — ABNORMAL HIGH (ref 70–99)
Potassium: 4.5 mmol/L (ref 3.5–5.1)
Sodium: 137 mmol/L (ref 135–145)

## 2019-02-20 LAB — GLUCOSE, CAPILLARY
Glucose-Capillary: 101 mg/dL — ABNORMAL HIGH (ref 70–99)
Glucose-Capillary: 126 mg/dL — ABNORMAL HIGH (ref 70–99)
Glucose-Capillary: 145 mg/dL — ABNORMAL HIGH (ref 70–99)
Glucose-Capillary: 161 mg/dL — ABNORMAL HIGH (ref 70–99)
Glucose-Capillary: 183 mg/dL — ABNORMAL HIGH (ref 70–99)
Glucose-Capillary: 201 mg/dL — ABNORMAL HIGH (ref 70–99)
Glucose-Capillary: 208 mg/dL — ABNORMAL HIGH (ref 70–99)
Glucose-Capillary: 259 mg/dL — ABNORMAL HIGH (ref 70–99)
Glucose-Capillary: 63 mg/dL — ABNORMAL LOW (ref 70–99)
Glucose-Capillary: 87 mg/dL (ref 70–99)
Glucose-Capillary: 89 mg/dL (ref 70–99)

## 2019-02-20 LAB — HIV ANTIBODY (ROUTINE TESTING W REFLEX): HIV Screen 4th Generation wRfx: NONREACTIVE

## 2019-02-20 MED ORDER — INSULIN GLARGINE 100 UNIT/ML ~~LOC~~ SOLN
30.0000 [IU] | Freq: Every day | SUBCUTANEOUS | Status: DC
  Administered 2019-02-20: 30 [IU] via SUBCUTANEOUS
  Filled 2019-02-20 (×2): qty 0.3

## 2019-02-20 MED ORDER — FUROSEMIDE 40 MG PO TABS: 60.0000 mg | ORAL_TABLET | Freq: Two times a day (BID) | ORAL | Status: DC

## 2019-02-20 MED ORDER — FUROSEMIDE 10 MG/ML IJ SOLN: 60.0000 mg | Freq: Once | INTRAMUSCULAR | Status: DC

## 2019-02-20 MED ORDER — FUROSEMIDE 10 MG/ML IJ SOLN
80.0000 mg | Freq: Once | INTRAMUSCULAR | Status: AC
  Administered 2019-02-20: 80 mg via INTRAVENOUS
  Filled 2019-02-20: qty 8

## 2019-02-20 MED ORDER — TRIAMCINOLONE ACETONIDE 0.1 % EX CREA
TOPICAL_CREAM | Freq: Two times a day (BID) | CUTANEOUS | Status: DC
  Administered 2019-02-20: 21:00:00 via TOPICAL
  Administered 2019-02-21: 1 via TOPICAL
  Administered 2019-02-22 (×2): via TOPICAL
  Administered 2019-02-22: 1 via TOPICAL
  Administered 2019-02-23: 09:00:00 via TOPICAL
  Administered 2019-02-23: 1 via TOPICAL
  Administered 2019-02-24 (×2): via TOPICAL
  Filled 2019-02-20 (×2): qty 454

## 2019-02-20 NOTE — Progress Notes (Signed)
   Subjective: [name] reported feeling much better this morning. He feels he is almost back to his baseline. He states he has never required 5L supplemental oxygen. Sometimes he increases to 4L when he exerts himself more than usual. He still feels his lower extremities are swollen. All questions and concerns were addressed.    Objective:  Vital signs in last 24 hours: Vitals:   02/19/19 1725 02/19/19 2044 02/20/19 0015 02/20/19 0111  BP: (!) 168/93 (!) 149/83 (!) 149/93   Pulse: 91 81 80 82  Resp:    (!) 26  Temp: 98.2 F (36.8 C) 97.6 F (36.4 C) (!) 97.5 F (36.4 C)   TempSrc: Oral Oral Oral   SpO2: 94% 94% 100% 100%  Weight:      Height:       Gen: seen comfortably sitting at the bedside on 5L supplemental oxygen, no distress Ext: bilateral LE edema, no tenderness  Skin: chronic venous stasis changes in bilateral LE, extensive psoriasis, warm, dry Neuro: alert and oriented, sensation intact   Assessment/Plan:  Principal Problem:   Hypoglycemia Active Problems:   DM type 2 with diabetic peripheral neuropathy (HCC)   CHF (congestive heart failure) (HCC)   Altered mental status  [name] is a 53 year old man withmorbid obesity,OSA, HTN, type II DM, CKD, ischemic cardiomyopathyand CHF presenting with altered mental status.He was found unresponsive in his apartment, without his supplemental oxygen, eyes deviated to the right and with a lot of oral secretions. He was found to be hypoglycemic and started on a D50 infusion.  Altered mental status Hypoglycemia  Type II DM - CBG 63 --> 183 this morning - He was given 45 units of lantus yesterday afternoon and a total of 15 units of sliding scale. 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 Will try to wean back to his home requirements   - Increased oxygen requirement is most likely in the setting of missed medications for 2 days, hypoglycemic episode and not using home supplemental oxygen for the duration he was down - Continuing to diurese and will see if this improves his oxygen requirement  - Check pulse ox while ambulating after he is diuresed  - O2 goal 88-92% - BiPAP qhs, can use CPAP if for some reason BiPAP is not an option  CKD Stage III  - Cr 1.6 today, stable  Psoriasis  - Continue triamcinolone cream   Dispo: Anticipated discharge today if patient does not have any more hypoglycemic episodes after lunch. He is also having an increased oxygen requirement, will diurese and see if this improves his supplemental oxygen requirement.   Mike Craze, DO 02/20/2019, 6:42 AM Pager: [phone]

## 2019-02-20 NOTE — Progress Notes (Signed)
Pt FSBS was 89 before meals and 101 after meals.  Regular insulin 25units held.

## 2019-02-20 NOTE — Progress Notes (Signed)
SATURATION QUALIFICATIONS: (This note is used to comply with regulatory documentation for home oxygen)  Patient Saturations on 3 Liters of oxygen  at Rest = 91%  Patient Saturations on 3 Liters of oxygen while Ambulating = 84%  Patient Saturations on 4 Liters of oxygen while Ambulating = 86%  Please briefly explain why patient needs home oxygen: Pt currently requiring increased supplemental oxygen to maintain oxygen saturations with mobility.   Mabeline Caras, PT, DPT Acute Rehabilitation Services  Pager [phone] Office [phone]

## 2019-02-20 NOTE — Evaluation (Signed)
Physical Therapy Evaluation & Discharge Patient Details Name: [name] MRN: [id number] DOB: Jun 16, 1966 Today's Date: 02/20/2019   History of Present Illness  Pt is a 53 y.o. male admitted 02/18/19 with severe symptomatic hypoglycemia. PMH includes DM2, peripheral neuropathy, HTN, CHF, psoriasis, obesity hypoventilation syndrome (on 2-3L home O2 chronic).    Clinical Impression  Patient evaluated by Physical Therapy with no further acute PT needs identified. PTA, pt mod indep with rollator and lives alone; friends provide transportation. Today, pt mod indep with RW. SpO2 down to 84% on 3L O2 [name] (see saturations qualifications note). Incentive spirometer provided, pt able to demonstrate correct technique although very weak effort. Educ on home O2 and recommendation to purchase pulse oximeter to allow for self-monitoring. Acute PT is signing off. Thank you for this referral.    Follow Up Recommendations No PT follow up    Equipment Recommendations  None recommended by PT    Recommendations for Other Services       Precautions / Restrictions Precautions Precautions: Other (comment) Precaution Comments: Watch SpO2 Restrictions Weight Bearing Restrictions: No      Mobility  Bed Mobility Overal bed mobility: Modified Independent             General bed mobility comments: Reliant on momentum to power into sitting  Transfers Overall transfer level: Modified independent Equipment used: Rolling walker (2 wheeled)             General transfer comment: Reliant on momentum to power into standing  Ambulation/Gait Ambulation/Gait assistance: Modified independent (Device/Increase time) Gait Distance (Feet): 60 Feet Assistive device: Rolling walker (2 wheeled) Gait Pattern/deviations: Step-through pattern;Decreased stride length;Wide base of support Gait velocity: Decreased Gait velocity interpretation: 1.31 - 2.62 ft/sec, indicative of limited community  ambulator General Gait Details: Slow, steady gait with RW. Pt only requiring assist to manage lines/O2 tank. SpO2 down to 84% on 3L O2 [name], >86% on 4L O2   Stairs            Wheelchair Mobility    Modified Rankin (Stroke Patients Only)       Balance Overall balance assessment: Mild deficits observed, not formally tested                                           Pertinent Vitals/Pain Pain Assessment: Faces Faces Pain Scale: Hurts a little bit Pain Location: Chest Pain Descriptors / Indicators: Sore Pain Intervention(s): Monitored during session    Home Living Family/patient expects to be discharged to:: Private residence Living Arrangements: Alone Available Help at Discharge: Family;Friend(s);Available PRN/intermittently Type of Home: Apartment Home Access: Level entry     Home Layout: One level Home Equipment: Walker - 4 wheels;Walker - 2 wheels      Prior Function Level of Independence: Independent with assistive device(s)         Comments: Ambulatory with rollator for household and community ambulation. Prescribed 2L O2 for home use, but pt uses up to 3-4L depending     Hand Dominance        Extremity/Trunk Assessment   Upper Extremity Assessment Upper Extremity Assessment: Overall WFL for tasks assessed    Lower Extremity Assessment Lower Extremity Assessment: Overall WFL for tasks assessed       Communication   Communication: No difficulties  Cognition Arousal/Alertness: Awake/alert Behavior During Therapy: WFL for tasks assessed/performed Overall Cognitive Status: Within Functional Limits  for tasks assessed                                 General Comments: [facility] for simple tasks although pt fatigued      General Comments      Exercises     Assessment/Plan    PT Assessment Patent does not need any further PT services  PT Problem List         PT Treatment Interventions      PT Goals (Current  goals can be found in the Care Plan section)  Acute Rehab PT Goals PT Goal Formulation: All assessment and education complete, DC therapy    Frequency     Barriers to discharge        Co-evaluation               AM-PAC PT "6 Clicks" Mobility  Outcome Measure Help needed turning from your back to your side while in a flat bed without using bedrails?: None Help needed moving from lying on your back to sitting on the side of a flat bed without using bedrails?: None Help needed moving to and from a bed to a chair (including a wheelchair)?: None Help needed standing up from a chair using your arms (e.g., wheelchair or bedside chair)?: None Help needed to walk in hospital room?: None Help needed climbing 3-5 steps with a railing? : A Little 6 Click Score: 23    End of Session Equipment Utilized During Treatment: Oxygen Activity Tolerance: Patient tolerated treatment well;Patient limited by fatigue Patient left: in bed;with call bell/phone within reach Nurse Communication: Mobility status PT Visit Diagnosis: Other abnormalities of gait and mobility (R26.89)    Time: 6226-3335 PT Time Calculation (min) (ACUTE ONLY): 19 min   Charges:   PT Evaluation $PT Eval Moderate Complexity: [name], PT, DPT Acute Rehabilitation Services  Pager [phone] Office [name] 02/20/2019, 3:25 PM

## 2019-02-20 NOTE — Care Management Note (Addendum)
Case Management Note  Patient Details  Name: [name] MRN: [id number] Date of Birth: Apr 27, 1966  Subjective/Objective:   Pt presented for hypoglycemia. PTA from home alone. Patient has PCP at the [facility] in [place], [place]. He uses [facility] near [place]. Pt has DME 02  @ 2 Liters via [facility] Specialist [phone] option 1 then 2 fax # [phone] & [phone].                Action/Plan: CM did reach out  To MD- plan for d/c home today. MD will order an ambulatory saturation test to make sure patient will not need increased liter flow. CM did call [place] Ar at [facility] Specialist to see if tank can be delivered to hospital. E-tank will be delivered before transition home and patient will call his friend to transport home. Patient declines need for [facility] RN assistance. Patient states he is compliant with medications and appointment follow ups. No further needs from CM at this time.   Expected Discharge Date:                  Expected Discharge Plan:  Home/Self Care  In-House Referral:  Clinical Social Work(transportation)  Discharge planning Services  CM Consult  Post Acute Care Choice:  Durable Medical Equipment Choice offered to:  NA  DME Arranged:  Oxygen DME Agency:  Other - Comment(Active with [place] Specialist- office to deliver a tank for travel. )  HH Arranged:  NA [place] Agency:  NA  Status of Service:  Completed, signed off  If discussed at [place] of Stay Meetings, dates discussed:    Additional Comments: 1538 02-24-19 Jacqlyn Krauss, RN, BSN [phone] Pt has tank for travel home. 4 Liters will get him home-tank will last 2 hours. Pt transitioned to d/c lounge until ride comes at 6:00 pm. [facility] has new liter flow orders and will deliver home 02 concentrator to patients apartment tonight. No further needs from CM at this time.      1136 02-24-19 Jacqlyn Krauss,  RN,BSN [phone] CM will fax new orders to [place]. Patient has 02 tank for travel. No further needs from CM at this time.    Patient will need orders faxed to [place] for increased liter flow. Please send to both fax #'s listed above. 1541 02-20-19.  Bethena Roys, RN 02/20/2019, 12:26 PM

## 2019-02-21 ENCOUNTER — Inpatient Hospital Stay (HOSPITAL_COMMUNITY): Payer: Medicaid Other

## 2019-02-21 DIAGNOSIS — G9349 Other encephalopathy: Secondary | ICD-10-CM

## 2019-02-21 DIAGNOSIS — R55 Syncope and collapse: Secondary | ICD-10-CM

## 2019-02-21 DIAGNOSIS — G4733 Obstructive sleep apnea (adult) (pediatric): Secondary | ICD-10-CM

## 2019-02-21 DIAGNOSIS — Z6841 Body Mass Index (BMI) 40.0 and over, adult: Secondary | ICD-10-CM

## 2019-02-21 DIAGNOSIS — I272 Pulmonary hypertension, unspecified: Secondary | ICD-10-CM

## 2019-02-21 DIAGNOSIS — J9621 Acute and chronic respiratory failure with hypoxia: Secondary | ICD-10-CM

## 2019-02-21 DIAGNOSIS — E662 Morbid (severe) obesity with alveolar hypoventilation: Secondary | ICD-10-CM

## 2019-02-21 DIAGNOSIS — J9622 Acute and chronic respiratory failure with hypercapnia: Secondary | ICD-10-CM

## 2019-02-21 LAB — BASIC METABOLIC PANEL
Anion gap: 11 (ref 5–15)
BUN: 21 mg/dL — ABNORMAL HIGH (ref 6–20)
CO2: 33 mmol/L — ABNORMAL HIGH (ref 22–32)
Calcium: 9.2 mg/dL (ref 8.9–10.3)
Chloride: 95 mmol/L — ABNORMAL LOW (ref 98–111)
Creatinine, Ser: 1.57 mg/dL — ABNORMAL HIGH (ref 0.61–1.24)
GFR calc Af Amer: 59 mL/min — ABNORMAL LOW (ref >60)
GFR calc non Af Amer: 51 mL/min — ABNORMAL LOW (ref >60)
Glucose, Bld: 140 mg/dL — ABNORMAL HIGH (ref 70–99)
Potassium: 4.9 mmol/L (ref 3.5–5.1)
Sodium: 139 mmol/L (ref 135–145)

## 2019-02-21 LAB — BLOOD GAS, ARTERIAL
Acid-Base Excess: 14 mmol/L — ABNORMAL HIGH (ref 0.0–2.0)
Acid-Base Excess: 15.3 mmol/L — ABNORMAL HIGH (ref 0.0–2.0)
Bicarbonate: 41.1 mmol/L — ABNORMAL HIGH (ref 20.0–28.0)
Bicarbonate: 42.5 mmol/L — ABNORMAL HIGH (ref 20.0–28.0)
Delivery systems: POSITIVE
Delivery systems: POSITIVE
Drawn by: 441371
Drawn by: 441371
Expiratory PAP: 7
Expiratory PAP: 7
FIO2: 50
FIO2: 50
Inspiratory PAP: 20
Inspiratory PAP: 20
Mode: POSITIVE
Mode: POSITIVE
O2 Saturation: 97.5 %
O2 Saturation: 94.6 %
Patient temperature: 98.5
Patient temperature: 98.5
RATE: 8 resp/min
RATE: 8 resp/min
pCO2 arterial: 89.6 mmHg (ref 32.0–48.0)
pCO2 arterial: 93.5 mmHg (ref 32.0–48.0)
pH, Arterial: 7.283 — ABNORMAL LOW (ref 7.350–7.450)
pH, Arterial: 7.28 — ABNORMAL LOW (ref 7.350–7.450)
pO2, Arterial: 102 mmHg (ref 83.0–108.0)
pO2, Arterial: 79.9 mmHg — ABNORMAL LOW (ref 83.0–108.0)

## 2019-02-21 LAB — GLUCOSE, CAPILLARY
Glucose-Capillary: 120 mg/dL — ABNORMAL HIGH (ref 70–99)
Glucose-Capillary: 122 mg/dL — ABNORMAL HIGH (ref 70–99)
Glucose-Capillary: 128 mg/dL — ABNORMAL HIGH (ref 70–99)
Glucose-Capillary: 131 mg/dL — ABNORMAL HIGH (ref 70–99)
Glucose-Capillary: 134 mg/dL — ABNORMAL HIGH (ref 70–99)
Glucose-Capillary: 139 mg/dL — ABNORMAL HIGH (ref 70–99)
Glucose-Capillary: 176 mg/dL — ABNORMAL HIGH (ref 70–99)
Glucose-Capillary: 98 mg/dL (ref 70–99)

## 2019-02-21 LAB — CORTISOL-AM, BLOOD: Cortisol - AM: 8.3 ug/dL (ref 6.7–22.6)

## 2019-02-21 MED ORDER — FUROSEMIDE 10 MG/ML IJ SOLN
INTRAMUSCULAR | Status: AC
  Administered 2019-02-21: 10:00:00
  Filled 2019-02-21: qty 8

## 2019-02-21 MED ORDER — INSULIN ASPART 100 UNIT/ML ~~LOC~~ SOLN
0.0000 [IU] | Freq: Every day | SUBCUTANEOUS | Status: DC
  Administered 2019-02-22: 2 [IU] via SUBCUTANEOUS
  Administered 2019-02-23: 3 [IU] via SUBCUTANEOUS

## 2019-02-21 MED ORDER — INSULIN ASPART 100 UNIT/ML ~~LOC~~ SOLN
0.0000 [IU] | Freq: Three times a day (TID) | SUBCUTANEOUS | Status: DC
  Administered 2019-02-22: 3 [IU] via SUBCUTANEOUS
  Administered 2019-02-22 – 2019-02-23 (×2): 4 [IU] via SUBCUTANEOUS
  Administered 2019-02-23: 11 [IU] via SUBCUTANEOUS
  Administered 2019-02-23: 4 [IU] via SUBCUTANEOUS
  Administered 2019-02-24: 7 [IU] via SUBCUTANEOUS
  Administered 2019-02-24: 15 [IU] via SUBCUTANEOUS

## 2019-02-21 MED ORDER — ACETAZOLAMIDE 250 MG PO TABS
250.0000 mg | ORAL_TABLET | Freq: Two times a day (BID) | ORAL | Status: DC
  Administered 2019-02-21 – 2019-02-22 (×2): 250 mg via ORAL
  Filled 2019-02-21 (×2): qty 1

## 2019-02-21 MED ORDER — FUROSEMIDE 10 MG/ML IJ SOLN
80.0000 mg | Freq: Once | INTRAMUSCULAR | Status: AC
  Administered 2019-02-21: 80 mg via INTRAVENOUS

## 2019-02-21 MED ORDER — COSYNTROPIN 0.25 MG IJ SOLR
0.2500 mg | Freq: Once | INTRAMUSCULAR | Status: AC
  Administered 2019-02-22: 0.25 mg via INTRAVENOUS
  Filled 2019-02-21 (×2): qty 0.25

## 2019-02-21 MED ORDER — BENZONATATE 100 MG PO CAPS: 200.0000 mg | ORAL_CAPSULE | Freq: Three times a day (TID) | ORAL | Status: DC | PRN

## 2019-02-21 MED ORDER — INSULIN ASPART 100 UNIT/ML ~~LOC~~ SOLN
0.0000 [IU] | SUBCUTANEOUS | Status: DC
  Administered 2019-02-21: 1 [IU] via SUBCUTANEOUS

## 2019-02-21 NOTE — Progress Notes (Signed)
RT gave patient's inhaler at 0829.  The patient answers questions when asked and was able to use his inhaler, but he keeps drifting off to sleep and seems lethargic.  RT ask the patient if he would like to go on Bipap at this time, but he refused.  RN notified.

## 2019-02-21 NOTE — Progress Notes (Signed)
New result for ABG called in by RT Sue:  [place] =7.28; pO2= 79.9; O2 saturation = 94.6; pCO2 = 93.5; NaHCO3= 42.5. Text paged Teaching service to update. Awaiting phone call.

## 2019-02-21 NOTE — Progress Notes (Signed)
Visited patient at bedside due to the ABG demonstrating pCO2 93.5 despite prolonged BiPAP use. The patient was able to identify himself, why he was hospitalized, his DOB and inquired as to why the Bipap helped as compared to plain supplemental oxygen. I explained the reasoning behind the benefit of Bipap to which he seemed to somewhat understand. He further stated that he felt much better than earlier in the morning.   Vitals:   02/21/19 1247 02/21/19 1534  BP: (!) 155/88   Pulse: 80 77  Resp:  (!) 24  Temp: 98 F (36.7 C)   SpO2: 100% 98%   Patient vitals are stable, he is alert and oriented. In no acute distress.   PCCM consulted due to worsening acute on chronic hypercarbia. Will follow their recommendations.   Kathi Ludwig, MD [facility] Internal Medicine, PGY-2

## 2019-02-21 NOTE — Progress Notes (Signed)
Dr. Tobi Bastos called back and was updated. Per MD Critical care MD will need to see pt and to not let the pt eat or drink. THis RN had spoken with pt and had reiterated for the pt not to eat or drink until he has an order that he can eat. Pt expressed understanding by nodding his head and stating " ok Nurse".

## 2019-02-21 NOTE — Progress Notes (Signed)
Pt apparently lethargic at this time. Pt opens eyes when name is called but reverts back to sleep. CBG done at 122, BP 154/90. Oxygen saturation at 93 at 4 LPM n/cannula. Pt stated he already ate breakfast but tray just arrived  As he was indicating he already ate and promptly went close his eyes. Will continue to monitor. Will update MD accordingly.

## 2019-02-21 NOTE — Progress Notes (Signed)
Pt placed on Bipap by RN at 0910 per MD request.

## 2019-02-21 NOTE — Progress Notes (Signed)
Upon helping the patient back into bed from the bathroom, he sat back down and started swaying. I started to put his bipap back on him when he collapsed backward and began urinating on himself.  His O2 read 65%. After about 10 seconds he woke back up with no memory of what happened. CBG 128. Pt back on bipap @ 100% and remains alert and oriented. Will continue to monitor.

## 2019-02-21 NOTE — Progress Notes (Addendum)
   Subjective: Patient does not appear well this morning. Says he is feeling fatigued but has no specific complaints. Had a syncopal episode when he stood up to walk to the bathroom this morning. Says his oxygen was very low. Per RN documentation, he began swaying when he stood and collapsed backward and urinated on himself. His pulse ox was 65%. He regained consciousness after 10 seconds with no memory of the event.  He was placed on BiPAP then transitioned back to [place]. CBG was 134.  On my exam this morning patient was alert, saturating 90% on 6L high flow . He was tachypneic. Shortly after leaving, RN reported patient was becoming increasingly more somnolent. Opens eye to voice but quickly falls back asleep. Returned to bedside to re evaluate. Patient is somnolent and disoriented. Thinks he is at home.   Objective:  Vital signs in last 24 hours: Vitals:   02/21/19 0223 02/21/19 0511 02/21/19 0803 02/21/19 0829  BP:   (!) 156/93   Pulse:    81  Resp:    16  Temp:   98.5 F (36.9 C)   TempSrc:   Oral   SpO2: 95%  95% 97%  Weight:  (!) 139.6 kg    Height:       Physical Exam Constitutional: Somnolent, disoriented, NAD Cardiovascular: Distance heart sounds, but RRR, no murmurs, rubs, or gallops.  Pulmonary/Chest: Tachypneic, Bilateral rales and wheezing.  Abdominal: Soft, non tender, non distended. +BS.  Extremities: Warm and well perfused. Severe diffuse psorasis with thick scaly plaques.    Assessment/Plan:  Principal Problem:   Hypoglycemia Active Problems:   DM type 2 with diabetic peripheral neuropathy (HCC)   CHF (congestive heart failure) (HCC)   Altered mental status  Acute on Chronic Hypoxic and Hypercarbic Respiratory Failure: Due to obesity hypoventilation, OSA, and pulmonary edema on admission. Improved with diuresis yesterday but with persistent increase in oxygen requirement. This morning, patient is somnolent concerning for worsening hypercarbia. STAT abg with pH  7.28/ pCO2 89.9 / and pO2 102. Questionable diagnosis of asthma, we have continued his home inhalers.  -- STAT CXR -- Place back on BiPAP for hypercarbia  -- Repeat ABG this afternoon  -- IV lasix 80 -- Continue home Anoro daily and albuterol prn   Syncope: Unclear, likely due to severe hypoxia vs. Vasogal. Description sounds postural but pulse ox dropped to 65% during episode.  On telemetry, but mostly motion artifact during episode. Doubt cardiogenic.  -- Continue tele -- BiPAP  Hypoglycemia: Unclear reason for new hypoglycemia. He is a diabetic on long standing insulin at high doses. Regimen decreased, he received 29 units of short acting insulin yesterday morning and 30 units of Lantus yesterday evening. CBGs this morning in the 120- 130s post prandial without insulin. Morning cortisol check today somewhat low but not diagnostic of adrenal insufficiency.  -- Hold long acting and scheduled insulin  -- SSI-mod q4h while NPO  -- Frequent CBG checks  -- F/u ACTH stim test   HFpEF Ischemic Cardiomyopathy Volume status difficult to judge with morbid obesity. Responded well to IV lasix yesterday. With worsening respiratory status, will continue IV diuresis today. -- IV lasix 80 -- Telemetry   FEN: No fluids, replete lytes prn, NPO VTE ppx: Lovenox Code Status: FULL  Dispo: Anticipated discharge in approximately 2-3 day(s).   Velna Ochs, MD 02/21/2019, 9:09 AM Pager: [phone]

## 2019-02-21 NOTE — Consult Note (Signed)
NAME:  [name], MRN:  [id number], DOB:  November 08, 1966, LOS: 3 ADMISSION DATE:  02/18/2019, CONSULTATION DATE:  02/21/2019 REFERRING MD:  Dr. Evette Doffing, IMTS, CHIEF COMPLAINT:  Respiratory failure   Brief History   53 yo male with altered mental status from [place].  Found to have hypoxia, and productive cough.  Treated for CHF exacerbation.  Placed on Bipap for acute on chronic hypoxic/hypercapnic respiratory failure in setting of severe OSA with OHS.  PCCM asked to assist with management of respiratory failure.  History of present illness   53 yo male previously seen at [facility] by pulmonary.  Had sleep study from January 2018.  Found to have severe OSA.  Was prescribed Bipap 25/19 with 5 liters oxygen.  He was found at home to have altered mental status.  He was found to have blood sugar of 24.  He was admitted to IMTS.  His mental status improved.  He was started on supplemental oxygen and Bipap.  He was noted to have persistent respiratory acidosis.    He states he has only used 3 liters oxygen at home.  He feels hungry and wants to watch [facility] game tonight.    He is not having chest pain, abdominal pain, tremor, or headache.  Still has dry cough.  Never smoked.  Feels that anoro and albuterol help his breathing.  Past Medical History  OSA, NSVT, Psoriasis, HTN, DM, CKD, systolic/diastolic CHF, Bell's palsy, Pulmonary HTN, CAD, HLD  Significant Hospital Events   3/04 Admit  Consults:    Procedures:    Significant Diagnostic Tests:  PSG 12/25/16 [facility]) >> AHI 125, SpO2 low 55% PFT 06/04/17 [facility]) >> FEV1 1.69 (85%) , FEV1% 83, DLCO 64%  Micro Data:  Influenza PCR 3/04 >> negative  Antimicrobials:    Interim history/subjective:    Objective   BP (!) 155/88 (BP Location: Left Arm)   Pulse 77   Temp 98 F (36.7 C) (Axillary)   Resp (!) 24   Ht 5\' 3"  (1.6 m)   Wt (!) 139.6 kg   SpO2 98%   BMI 54.51 kg/m   FiO2 (%):  [40 %-50 %] 40 %   Intake/Output Summary  (Last 24 hours) at 02/21/2019 1551 Last data filed at 02/21/2019 1134 Gross per 24 hour  Intake 390 ml  Output 2575 ml  Net -2185 ml   Filed Weights   02/18/19 1249 02/19/19 0425 02/21/19 0511  Weight: (!) 138.9 kg (!) 138.2 kg (!) 139.6 kg    Examination:  General - alert Eyes - pupils reactive ENT - Bipap mask on Cardiac - regular rate/rhythm, no murmur Chest - decreased breath sounds, equal breath sounds b/l, no wheezing or rales Abdomen - soft, non tender, + bowel sounds Extremities - 1+ edema Skin - diffuse plaques and scaling Neuro - normal strength, moves extremities, follows commands Lymphatics - no lymphadenopathy Psych - normal mood and behavior  CXR (reviewed by me) - cardiomegaly with interstitial edema, no ASD   Resolved Hospital Problem list     Assessment & Plan:   Acute on chronic hypoxic, hypercapnic respiratory failure. Discussion: From OSA with OHS and atelectasis. Plan - Bipap 25/19 cm H2O qhs and prn during the day - adjust oxygen to keep SpO2 88 to 95% - assess progress based on mental status and HCO3 on BMET; will try to avoid ABGs - add diamox for now - bronchial hygiene - mobilize as able  Cough. Plan - continue anoro and prn albuterol -  prn tessalon  Morbid obesity with 54.41. Plan - needs nutrition assessment to assist with weight loss  Acute on chronic combined CHF. Plan - negative fluid balance as able  WHO group 2 and 3 pulmonary hypertension. Plan - continue to optimize cardiac and pulmonary status  Hypoglycemia with hx of DM type II. Plan - per IMTS  Best practice:  Diet: carb modified/heart healthy DVT prophylaxis: lovenox GI prophylaxis: protonix Mobility: OOB to chair Code Status: Full code Family Communication: no family at bedside Disposition: Progressive care  Labs   CBC: Recent Labs  Lab 02/18/19 1239 02/18/19 1252 02/18/19 1956 02/18/19 2327  WBC 12.2*  --   --  16.3*  NEUTROABS 10.0*  --   --   --    HGB 11.7* 13.3 12.2* 10.2*  HCT 41.4 39.0 36.0* 35.7*  MCV 90.2  --   --  85.8  PLT 309  --   --  659    Basic Metabolic Panel: Recent Labs  Lab 02/18/19 1243  02/18/19 1956 02/18/19 2327 02/19/19 0735 02/20/19 0815 02/21/19 0736  NA 143   < > 142 141 141 137 139  K 3.6   < > 3.9 4.5 4.7 4.5 4.9  CL 98  --   --  97* 94* 96* 95*  CO2 35*  --   --  35* 30 33* 33*  GLUCOSE 24*  --   --  58* 226* 192* 140*  BUN 21*  --   --  20 20 20  21*  CREATININE 1.66*  --   --  1.55* 1.62* 1.62* 1.57*  CALCIUM 9.4  --   --  9.2 9.3 8.8* 9.2   < > = values in this interval not displayed.   GFR: Estimated Creatinine Clearance: 71.7 mL/min (A) (by C-G formula based on SCr of 1.57 mg/dL (H)). Recent Labs  Lab 02/18/19 1239 02/18/19 1253 02/18/19 2321 02/18/19 2327  WBC 12.2*  --   --  16.3*  LATICACIDVEN  --  1.2 1.4  --     Liver Function Tests: Recent Labs  Lab 02/18/19 1243  AST 19  ALT 17  ALKPHOS 133*  BILITOT 0.9  PROT 8.4*  ALBUMIN 3.3*   No results for input(s): LIPASE, AMYLASE in the last 168 hours. No results for input(s): AMMONIA in the last 168 hours.  ABG    Component Value Date/Time   PHART 7.280 (L) 02/21/2019 1238   PCO2ART 93.5 (HH) 02/21/2019 1238   PO2ART 79.9 (L) 02/21/2019 1238   HCO3 42.5 (H) 02/21/2019 1238   TCO2 40 (H) 02/18/2019 1956   O2SAT 94.6 02/21/2019 1238     Coagulation Profile: Recent Labs  Lab 02/18/19 1254  INR 1.1    Cardiac Enzymes: Recent Labs  Lab 02/18/19 1243  TROPONINI <0.03    HbA1C: Hgb A1c MFr Bld  Date/Time Value Ref Range Status  10/08/2018 09:55 AM 7.9 (H) 4.8 - 5.6 % Final    Comment:             Prediabetes: 5.7 - 6.4          Diabetes: >6.4          Glycemic control for adults with diabetes: <7.0   06/12/2018 07:38 PM 10.7 (H) 4.8 - 5.6 % Final    Comment:             Prediabetes: 5.7 - 6.4          Diabetes: >6.4  Glycemic control for adults with diabetes: <7.0     CBG: Recent  Labs  Lab 02/21/19 0209 02/21/19 0441 02/21/19 0758 02/21/19 0846 02/21/19 1130  GLUCAP 128* 131* 134* 122* 139*    Review of Systems:   Reviewed and negative except in HPI  Past Medical History  He,  has a past medical history of Bell's palsy, CHF (congestive heart failure) ([place]), Chronic kidney disease, Diabetes mellitus without complication ([place]), Hypertension, NSVT (nonsustained ventricular tachycardia) ([place]), Obstructive sleep apnea, Psoriasis, and Pulmonary HTN ([place]).   Surgical History   He has not had any prior surgeries  Social History   reports that he has never smoked. He has never used smokeless tobacco. He reports current alcohol use of about 2.0 standard drinks of alcohol per week. He reports that he does not use drugs.   Family History   His family history includes Heart failure in his mother; Kidney failure in his brother.   Allergies Allergies  Allergen Reactions  . Shellfish Allergy Anaphylaxis    Face and throat swelling, difficulty breathing Allergy can be triggered by touching (contact)     Home Medications  Prior to Admission medications   Medication Sig Start Date End Date Taking? Authorizing Provider  albuterol (PROVENTIL HFA;VENTOLIN HFA) 108 (90 Base) MCG/ACT inhaler Inhale 2 puffs into the lungs every 4 (four) hours as needed for wheezing or shortness of breath. 01/20/19  Yes Doles-Johnson, Teah, NP  allopurinol (ZYLOPRIM) 300 MG tablet Take 1 tablet (300 mg total) by mouth daily. 01/20/19  Yes Doles-Johnson, Teah, NP  aspirin EC 81 MG tablet Take 1 tablet (81 mg total) by mouth daily. 10/07/18  Yes Doles-Johnson, Teah, NP  atorvastatin (LIPITOR) 40 MG tablet Take 1 tablet (40 mg total) by mouth at bedtime. 01/20/19  Yes Doles-Johnson, Teah, NP  carvedilol (COREG) 25 MG tablet Take 1 tablet (25 mg total) by mouth every 12 (twelve) hours. 01/20/19  Yes Doles-Johnson, Teah, NP  Cholecalciferol (D3-1000) 25 MCG (1000 UT) tablet Take 2 tablets (2,000 Units  total) by mouth daily. 01/20/19  Yes Doles-Johnson, Teah, NP  cyclobenzaprine (FLEXERIL) 5 MG tablet Take 1 tablet (5 mg total) by mouth at bedtime as needed for muscle spasms. 01/20/19  Yes Doles-Johnson, Teah, NP  Dulaglutide (TRULICITY) 8.41 LK/4.4WN SOPN Inject 0.5 mg subcutaneously in the skin once a week to help with diabetes and weight loss Patient taking differently: Inject 0.5 mg into the skin every 7 (seven) days. THURSDAYS 01/20/19  Yes Doles-Johnson, Teah, NP  furosemide (LASIX) 40 MG tablet Take 1.5 tablets (60 mg total) by mouth 2 (two) times daily. 10/07/18  Yes Doles-Johnson, Teah, NP  gabapentin (NEURONTIN) 300 MG capsule Take 2 capsules (600 mg total) by mouth 3 (three) times daily. 01/20/19  Yes Doles-Johnson, Teah, NP  insulin glargine (LANTUS) 100 UNIT/ML injection Inject 0.9 mLs (90 Units total) into the skin 2 (two) times daily. 01/20/19  Yes Doles-Johnson, Teah, NP  insulin regular (HUMULIN R) 100 units/mL injection INKECT 50 UNITS INTO THE SKIN 3 TIMES A DAY BEFORE MEALS Patient taking differently: Inject 50 Units into the skin 3 (three) times daily before meals.  01/20/19  Yes Doles-Johnson, Teah, NP  isosorbide mononitrate (IMDUR) 60 MG 24 hr tablet Take 1 tablet (60 mg total) by mouth daily. 01/20/19  Yes Doles-Johnson, Teah, NP  magnesium oxide (MAG-OX) 400 MG tablet Take 1 tablet (400 mg total) by mouth daily. 01/20/19  Yes Doles-Johnson, Teah, NP  metolazone (ZAROXOLYN) 2.5 MG tablet Take 1 tablet (2.5  mg total) by mouth as needed. Patient taking differently: Take 2.5 mg by mouth daily as needed (bloating or swelling).  01/20/19  Yes Doles-Johnson, Teah, NP  omeprazole (PRILOSEC) 20 MG capsule Take 1 capsule (20 mg total) by mouth 2 (two) times daily before a meal. 01/20/19  Yes Doles-Johnson, Teah, NP  triamcinolone cream (KENALOG) 0.1 % APPLY 1 APPLICATON TOPICALLY 2 TIMES A DAY. Patient taking differently: Apply 1 application topically 2 (two) times daily.  02/12/19  Yes Tawni Millers,  MD  umeclidinium-vilanterol (ANORO ELLIPTA) 62.5-25 MCG/INH AEPB Inhale 1 puff into the lungs daily. 09/05/18  Yes Laverle Hobby, MD  triamcinolone ointment (KENALOG) 0.5 % Apply 1 application topically 2 (two) times daily. Patient not taking: Reported on 02/19/2019 01/20/19   Doles-Johnson, Leonarda Salon, NP     Chesley Mires, MD [facility] Pulmonary/Critical Care 02/21/2019, 3:51 PM

## 2019-02-22 LAB — BASIC METABOLIC PANEL
Anion gap: 7 (ref 5–15)
BUN: 22 mg/dL — ABNORMAL HIGH (ref 6–20)
CO2: 40 mmol/L — ABNORMAL HIGH (ref 22–32)
Calcium: 9.5 mg/dL (ref 8.9–10.3)
Chloride: 91 mmol/L — ABNORMAL LOW (ref 98–111)
Creatinine, Ser: 1.55 mg/dL — ABNORMAL HIGH (ref 0.61–1.24)
GFR calc Af Amer: 60 mL/min — ABNORMAL LOW (ref >60)
GFR calc non Af Amer: 51 mL/min — ABNORMAL LOW (ref >60)
Glucose, Bld: 165 mg/dL — ABNORMAL HIGH (ref 70–99)
Potassium: 4.1 mmol/L (ref 3.5–5.1)
Sodium: 138 mmol/L (ref 135–145)

## 2019-02-22 LAB — GLUCOSE, CAPILLARY
Glucose-Capillary: 130 mg/dL — ABNORMAL HIGH (ref 70–99)
Glucose-Capillary: 111 mg/dL — ABNORMAL HIGH (ref 70–99)
Glucose-Capillary: 166 mg/dL — ABNORMAL HIGH (ref 70–99)
Glucose-Capillary: 202 mg/dL — ABNORMAL HIGH (ref 70–99)

## 2019-02-22 LAB — CBC
HCT: 32 % — ABNORMAL LOW (ref 39.0–52.0)
Hemoglobin: 9 g/dL — ABNORMAL LOW (ref 13.0–17.0)
MCH: 25.2 pg — ABNORMAL LOW (ref 26.0–34.0)
MCHC: 28.1 g/dL — ABNORMAL LOW (ref 30.0–36.0)
MCV: 89.6 fL (ref 80.0–100.0)
Platelets: 227 10*3/uL (ref 150–400)
RBC: 3.57 MIL/uL — ABNORMAL LOW (ref 4.22–5.81)
RDW: 19 % — ABNORMAL HIGH (ref 11.5–15.5)
WBC: 10.7 10*3/uL — ABNORMAL HIGH (ref 4.0–10.5)
nRBC: 0 % (ref 0.0–0.2)

## 2019-02-22 LAB — ACTH STIMULATION, 3 TIME POINTS
Cortisol, 30 Min: 12.9 ug/dL
Cortisol, 60 Min: 17 ug/dL
Cortisol, Base: 4 ug/dL

## 2019-02-22 MED ORDER — FUROSEMIDE 10 MG/ML IJ SOLN
80.0000 mg | Freq: Once | INTRAMUSCULAR | Status: AC
  Administered 2019-02-22: 80 mg via INTRAVENOUS
  Filled 2019-02-22: qty 8

## 2019-02-22 NOTE — Progress Notes (Signed)
Lab unsuccessful with drawing ordered labs on patient. MD made aware of issue and RN called to get another phlebotomist to attempt.

## 2019-02-22 NOTE — Progress Notes (Signed)
   Subjective: Patient was seen on BiPAP this morning. He was somnolent and difficult to arouse.   He was re-evaluated later this morning and alert and oriented x3. He states he is hungry and ready to go home. He does not remember falling down yesterday or having worsening respiratory function. He states he feels back to his baseline. Denies any acute complaints. All questions and concerns addressed.   Objective:  Vital signs in last 24 hours: Vitals:   02/22/19 0003 02/22/19 0344 02/22/19 0347 02/22/19 0500  BP: (!) 149/89  (!) 159/93   Pulse: 81  79   Resp: 16  16   Temp:   (!) 97.4 F (36.3 C)   TempSrc:   Oral   SpO2: 99%  96% 97%  Weight:  (!) 137.4 kg (!) 137.4 kg   Height:       Physical Exam General: seen on BiPAP, somnolent on BiPAP, alert and oriented , no distress CV: RRR, no murmurs Ext: bilateral LE edema, warm, severe psoriasis with thick scaly plaques  Abdomen: soft, non-distended   Assessment/Plan:  Principal Problem:   Hypoglycemia Active Problems:   DM type 2 with diabetic peripheral neuropathy (HCC)   CHF (congestive heart failure) (HCC)   Altered mental status  Acute on Chronic Hypoxic and Hypercarbic Respiratory Failure: Due to obesity hypoventilation, OSA, and pulmonary edema on admission. Improved with diuresis but with persistent increase in oxygen requirement. This morning, patient seen on BiPAP and initially somnolent. Re-evaluated a couple hours later and he was alert and oriented x3 on 6L supplemental oxygen. -- CXR yesterday showed slight improvement of CHF -- Wean O2 as tolerated -- IV lasix 80 -- Continue home Anoro daily and albuterol prn   Syncope: No more episodes of syncope since yesterday morning. Continue to monitor. -- Continue tele -- BiPAP prn  Hypoglycemia: Unclear reason for new hypoglycemia. He is a diabetic on long standing insulin at high doses. Regimen decreased, he received 25 units of short acting insulin yesterday  morning with breakfast and a total of 4 units sliding scale in the past 24 hours . CBGs this morning in the 120- 130s.   -- Hold long acting and scheduled insulin  -- SSI-mod q4h while NPO  -- Frequent CBG checks   -- F/u ACTH stim test   HFpEF Ischemic Cardiomyopathy Volume status difficult to judge with morbid obesity. Responded well to IV lasix yesterday. With worsening respiratory status, will continue IV diuresis today. -- IV lasix 80 -- Telemetry   Dispo: Anticipated discharge pending clinical improvement and respiratory status.   Mike Craze, DO 02/22/2019, 7:11 AM Pager: [phone]

## 2019-02-22 NOTE — Progress Notes (Signed)
2nd attempt to draw labs was unsuccessful. MD and charge nurse aware of issue. Phlebotomist stated she will send another person to attempt STAT.

## 2019-02-22 NOTE — Progress Notes (Signed)
NAME:  [name], MRN:  [id number], DOB:  1966-01-27, LOS: 4 ADMISSION DATE:  02/18/2019, CONSULTATION DATE:  02/21/2019 REFERRING MD:  Dr. Evette Doffing, [facility]MT[facility], CHIEF COMPLAINT:  Respiratory failure   Brief History   53 yo male with altered mental status from [place].  Found to have hypoxia, and productive cough.  Treated for CHF exacerbation.  Placed on Bipap for acute on chronic hypoxic/hypercapnic respiratory failure in setting of severe OSA with OHS.  PCCM asked to assist with management of respiratory failure.  History of present illness   53 yo male previously seen at [facility] by pulmonary.  Had sleep study from January 2018.  Found to have severe OSA.  Was prescribed Bipap 25/19 with 5 liters oxygen.  He was found at home to have altered mental status.  He was found to have blood sugar of 24.  He was admitted to [facility].  His mental status improved.  He was started on supplemental oxygen and Bipap.  He was noted to have persistent respiratory acidosis.    He states he has only used 3 liters oxygen at home.  He feels hungry and wants to watch [facility] [place] game tonight.    He is not having chest pain, abdominal pain, tremor, or headache.  Still has dry cough.  Never smoked.  Feels that anoro and albuterol help his breathing.  Past Medical History  OSA, NSVT, Psoriasis, HTN, DM, CKD, systolic/diastolic CHF, Bell's palsy, Pulmonary HTN, CAD, HLD  Significant Hospital Events   3/04 Admit  Consults:  3/7 Pulmonary   Procedures:    Significant Diagnostic Tests:  PSG 12/25/16 ([facility]) >> AHI 125, SpO2 low 55% PFT 06/04/17 ([facility]) >> FEV1 1.69 (85%) , FEV1% 83, DLCO 64%  Micro Data:  Influenza PCR 3/04 >> negative  Antimicrobials:    Interim history/subjective:  Feeling better, slept with BIPAP all night  Alert and oriented this am , (not happy about basketball game last pm)  Good UOP with diuresis   Objective   BP (!) 143/107   Pulse 73   Temp 98 F (36.7 C) (Oral)   Resp 20   Ht 5'  3" (1.6 m)   Wt (!) 137.4 kg   SpO2 97%   BMI 53.67 kg/m   FiO2 (%):  [40 %-50 %] 40 %   Intake/Output Summary (Last 24 hours) at 02/22/2019 1157 Last data filed at 02/22/2019 0930 Gross per 24 hour  Intake 250 ml  Output 2700 ml  Net -2450 ml   Filed Weights   02/21/19 0511 02/22/19 0344 02/22/19 0347  Weight: (!) 139.6 kg (!) 137.4 kg (!) 137.4 kg    Examination:  General - NAD , morbidly obese  HEENT : AT/[place]  Cardiac - regular rate/rhythm, no murmur Chest - decreased breath sounds, equal breath sounds b/l, no wheezing or rales Abdomen - soft, non tender, + bowel sounds Extremities - 1+ edema Skin - diffuse plaques and scaling Neuro - normal strength, moves extremities, follows commands Lymphatics - no lymphadenopathy Psych - normal mood and behavior     Resolved Hospital Problem list     Assessment & Plan:   Acute on chronic hypoxic, hypercapnic respiratory failure. Discussion: From OSA with OHS and atelectasis. Plan - Bipap 25/19 cm H2O qhs and prn during the day with naps  - adjust oxygen to keep SpO2 88 to 95% - assess progress based on mental status and HCO3 on - would set up with [facility]  - can set up  at [facility] office with Dr. Verita Schneiders.   BMET; will try to avoid ABGs - may d/c diamox - bronchial hygiene - mobilize as able  Cough. Plan - continue anoro and prn albuterol - prn tessalon  Morbid obesity with BMI 54.41. Plan - needs nutrition assessment to assist with weight loss  Acute on chronic combined CHF. Plan - negative fluid balance as able  WHO group 2 and 3 pulmonary hypertension. Plan - continue to optimize cardiac and pulmonary status  Hypoglycemia with hx of DM type II. Plan - per IMTS  Best practice:  Diet: carb modified/heart healthy DVT prophylaxis: lovenox GI prophylaxis: protonix Mobility: OOB to chair Code Status: Full code Family Communication: no family at bedside Disposition: Progressive  care-PCCM /pulmonary to sign off, set up OP follow up .   Labs   CBC: Recent Labs  Lab 02/18/19 1239 02/18/19 1252 02/18/19 1956 02/18/19 2327  WBC 12.2*  --   --  16.3*  NEUTROABS 10.0*  --   --   --   HGB 11.7* 13.3 12.2* 10.2*  HCT 41.4 39.0 36.0* 35.7*  MCV 90.2  --   --  85.8  PLT 309  --   --  025    Basic Metabolic Panel: Recent Labs  Lab 02/18/19 1243  02/18/19 1956 02/18/19 2327 02/19/19 0735 02/20/19 0815 02/21/19 0736  NA 143   < > 142 141 141 137 139  K 3.6   < > 3.9 4.5 4.7 4.5 4.9  CL 98  --   --  97* 94* 96* 95*  CO2 35*  --   --  35* 30 33* 33*  GLUCOSE 24*  --   --  58* 226* 192* 140*  BUN 21*  --   --  20 20 20  21*  CREATININE 1.66*  --   --  1.55* 1.62* 1.62* 1.57*  CALCIUM 9.4  --   --  9.2 9.3 8.8* 9.2   < > = values in this interval not displayed.   GFR: Estimated Creatinine Clearance: 70.9 mL/min (A) (by C-G formula based on SCr of 1.57 mg/dL (H)). Recent Labs  Lab 02/18/19 1239 02/18/19 1253 02/18/19 2321 02/18/19 2327  WBC 12.2*  --   --  16.3*  LATICACIDVEN  --  1.2 1.4  --     Liver Function Tests: Recent Labs  Lab 02/18/19 1243  AST 19  ALT 17  ALKPHOS 133*  BILITOT 0.9  PROT 8.4*  ALBUMIN 3.3*   No results for input(s): LIPASE, AMYLASE in the last 168 hours. No results for input(s): AMMONIA in the last 168 hours.  ABG    Component Value Date/Time   PHART 7.280 (L) 02/21/2019 1238   PCO2ART 93.5 (HH) 02/21/2019 1238   PO2ART 79.9 (L) 02/21/2019 1238   HCO3 42.5 (H) 02/21/2019 1238   TCO2 40 (H) 02/18/2019 1956   O2SAT 94.6 02/21/2019 1238     Coagulation Profile: Recent Labs  Lab 02/18/19 1254  INR 1.1    Cardiac Enzymes: Recent Labs  Lab 02/18/19 1243  TROPONINI <0.03    HbA1C: Hgb A1c MFr Bld  Date/Time Value Ref Range Status  10/08/2018 09:55 AM 7.9 (H) 4.8 - 5.6 % Final    Comment:             Prediabetes: 5.7 - 6.4          Diabetes: >6.4          Glycemic control for adults with diabetes:  <7.0  06/12/2018 07:38 PM 10.7 (H) 4.8 - 5.6 % Final    Comment:             Prediabetes: 5.7 - 6.4          Diabetes: >6.4          Glycemic control for adults with diabetes: <7.0     CBG: Recent Labs  Lab 02/21/19 0846 02/21/19 1130 02/21/19 1654 02/21/19 2012 02/22/19 0801  GLUCAP 122* 139* 98 120* 130*    Review of Systems:   Reviewed and negative except in HPI  Past Medical History  He,  has a past medical history of Bell's palsy, CHF (congestive heart failure) ([place]), Chronic kidney disease, Diabetes mellitus without complication ([place]), Hypertension, NSVT (nonsustained ventricular tachycardia) ([place]), Obstructive sleep apnea, Psoriasis, and Pulmonary HTN ([place]).   Surgical History   He has not had any prior surgeries  Social History   reports that he has never smoked. He has never used smokeless tobacco. He reports current alcohol use of about 2.0 standard drinks of alcohol per week. He reports that he does not use drugs.   Family History   His family history includes Heart failure in his mother; Kidney failure in his brother.   Allergies Allergies  Allergen Reactions  . Shellfish Allergy Anaphylaxis    Face and throat swelling, difficulty breathing Allergy can be triggered by touching (contact)     Home Medications  Prior to Admission medications   Medication Sig Start Date End Date Taking? Authorizing Provider  albuterol (PROVENTIL HFA;VENTOLIN HFA) 108 (90 Base) MCG/ACT inhaler Inhale 2 puffs into the lungs every 4 (four) hours as needed for wheezing or shortness of breath. 01/20/19  Yes Doles-Johnson, Teah, NP  allopurinol (ZYLOPRIM) 300 MG tablet Take 1 tablet (300 mg total) by mouth daily. 01/20/19  Yes Doles-Johnson, Teah, NP  aspirin EC 81 MG tablet Take 1 tablet (81 mg total) by mouth daily. 10/07/18  Yes Doles-Johnson, Teah, NP  atorvastatin (LIPITOR) 40 MG tablet Take 1 tablet (40 mg total) by mouth at bedtime. 01/20/19  Yes Doles-Johnson, Teah, NP    carvedilol (COREG) 25 MG tablet Take 1 tablet (25 mg total) by mouth every 12 (twelve) hours. 01/20/19  Yes Doles-Johnson, Teah, NP  Cholecalciferol (D3-1000) 25 MCG (1000 UT) tablet Take 2 tablets (2,000 Units total) by mouth daily. 01/20/19  Yes Doles-Johnson, Teah, NP  cyclobenzaprine (FLEXERIL) 5 MG tablet Take 1 tablet (5 mg total) by mouth at bedtime as needed for muscle spasms. 01/20/19  Yes Doles-Johnson, Teah, NP  Dulaglutide (TRULICITY) 7.29 MS/1.1DB SOPN Inject 0.5 mg subcutaneously in the skin once a week to help with diabetes and weight loss Patient taking differently: Inject 0.5 mg into the skin every 7 (seven) days. THURSDAYS 01/20/19  Yes Doles-Johnson, Teah, NP  furosemide (LASIX) 40 MG tablet Take 1.5 tablets (60 mg total) by mouth 2 (two) times daily. 10/07/18  Yes Doles-Johnson, Teah, NP  gabapentin (NEURONTIN) 300 MG capsule Take 2 capsules (600 mg total) by mouth 3 (three) times daily. 01/20/19  Yes Doles-Johnson, Teah, NP  insulin glargine (LANTUS) 100 UNIT/ML injection Inject 0.9 mLs (90 Units total) into the skin 2 (two) times daily. 01/20/19  Yes Doles-Johnson, Teah, NP  insulin regular (HUMULIN R) 100 units/mL injection INKECT 50 UNITS INTO THE SKIN 3 TIMES A DAY BEFORE MEALS Patient taking differently: Inject 50 Units into the skin 3 (three) times daily before meals.  01/20/19  Yes Doles-Johnson, Teah, NP  isosorbide mononitrate (IMDUR) 60 MG  24 hr tablet Take 1 tablet (60 mg total) by mouth daily. 01/20/19  Yes Doles-Johnson, Teah, NP  magnesium oxide (MAG-OX) 400 MG tablet Take 1 tablet (400 mg total) by mouth daily. 01/20/19  Yes Doles-Johnson, Teah, NP  metolazone (ZAROXOLYN) 2.5 MG tablet Take 1 tablet (2.5 mg total) by mouth as needed. Patient taking differently: Take 2.5 mg by mouth daily as needed (bloating or swelling).  01/20/19  Yes Doles-Johnson, Teah, NP  omeprazole (PRILOSEC) 20 MG capsule Take 1 capsule (20 mg total) by mouth 2 (two) times daily before a meal. 01/20/19  Yes  Doles-Johnson, Teah, NP  triamcinolone cream (KENALOG) 0.1 % APPLY 1 APPLICATON TOPICALLY 2 TIMES A DAY. Patient taking differently: Apply 1 application topically 2 (two) times daily.  02/12/19  Yes Tawni Millers, MD  umeclidinium-vilanterol (ANORO ELLIPTA) 62.5-25 MCG/INH AEPB Inhale 1 puff into the lungs daily. 09/05/18  Yes Laverle Hobby, MD  triamcinolone ointment (KENALOG) 0.5 % Apply 1 application topically 2 (two) times daily. Patient not taking: Reported on 02/19/2019 01/20/19   Doles-Johnson, Leonarda Salon, NP       NP-C  [facility]  [phone]  02/22/2019   02/22/2019, 11:57 AM

## 2019-02-22 NOTE — Progress Notes (Signed)
RN spoke with MD concerning am labs not drawn. RN called lab and asked to them to please come draw CBC, BMP, and ACTH as soon as possible.

## 2019-02-23 LAB — GLUCOSE, CAPILLARY
Glucose-Capillary: 171 mg/dL — ABNORMAL HIGH (ref 70–99)
Glucose-Capillary: 283 mg/dL — ABNORMAL HIGH (ref 70–99)
Glucose-Capillary: 172 mg/dL — ABNORMAL HIGH (ref 70–99)
Glucose-Capillary: 280 mg/dL — ABNORMAL HIGH (ref 70–99)

## 2019-02-23 LAB — ACTH: C206 ACTH: 28.5 pg/mL (ref 7.2–63.3)

## 2019-02-23 MED ORDER — METFORMIN HCL 500 MG PO TABS
500.0000 mg | ORAL_TABLET | Freq: Every day | ORAL | Status: DC
  Administered 2019-02-23 – 2019-02-24 (×2): 500 mg via ORAL
  Filled 2019-02-23 (×2): qty 1

## 2019-02-23 MED ORDER — POLYETHYLENE GLYCOL 3350 17 G PO PACK
17.0000 g | PACK | Freq: Every day | ORAL | Status: DC | PRN
  Administered 2019-02-23: 17 g via ORAL
  Filled 2019-02-23: qty 1

## 2019-02-23 MED ORDER — FUROSEMIDE 40 MG PO TABS
60.0000 mg | ORAL_TABLET | Freq: Two times a day (BID) | ORAL | Status: DC
  Administered 2019-02-23 – 2019-02-24 (×2): 60 mg via ORAL
  Filled 2019-02-23 (×2): qty 1

## 2019-02-23 MED ORDER — INSULIN GLARGINE 100 UNIT/ML ~~LOC~~ SOLN: 20.0000 [IU] | Freq: Every day | SUBCUTANEOUS | Status: DC

## 2019-02-23 NOTE — Progress Notes (Signed)
   Subjective: Mr. Buller reported feeling close to his baseline this morning. He feels his breathing is doing well on supplemental oxygen. He denies any acute complaints. States he does not follow with a pulmonologist. In the past he saw pulm at [facility] but only a couple of times a year ago. All questions and concerns addressed.    Objective:  Vital signs in last 24 hours: Vitals:   02/22/19 2126 02/22/19 2247 02/23/19 0706 02/23/19 0708  BP: (!) 145/90  127/74   Pulse:  78 71   Resp: 19 18 16    Temp: 98.5 F (36.9 C)   98.6 F (37 C)  TempSrc: Oral   Axillary  SpO2:  97%  100%  Weight:    135.9 kg  Height:       Physical exam Gen: seen sitting up at the bedside, on 4L supplemental oxygen Ext: bilateral edema, extensive psoriasis with plaques  Neuro: alert and oriented x3, normal mood   Assessment/Plan:  Principal Problem:   Hypoglycemia Active Problems:   DM type 2 with diabetic peripheral neuropathy (HCC)   CHF (congestive heart failure) (HCC)   Altered mental status  Acute on Chronic Hypoxic and Hypercarbic Respiratory Failure:Due to obesity hypoventilation, OSA, and pulmonary edema on admission. Improved with diuresis but with persistent increase in oxygen requirement. He has been requiring BiPAP more frequently and bicarb of 40 now. Saturating well on 4L today. He will need to closely follow up with pulm outpatient; may need to be considered for a tracheostomy if he continues to require BiPAP so frequently. -- Wean O2 as tolerated -- Continue BiPAP prn  -- Continue home Anoro daily and albuterol prn  Syncope:No more episodes of syncope since 3/7.  -- Continue tele -- BiPAP prn  Hypoglycemia:Unclear reason for new hypoglycemia. He is a diabetic on long standing insulin at high doses. Patient has been on and off BiPAP and eating significantly less. Has only required 9 units of short acting insulin in the past 24 hours. CBGs overnight 202. ACTH stim test showed base  cortisol of 4.0, 12.9 at 30 min and 17 at 60 min; can rule out adrenal insufficiency.   -- Hold long acting and scheduled insulin  -- SSI-modq4h while NPO -- Frequent CBG checks    HFpEF Ischemic Cardiomyopathy Volume status difficult to judge with morbid obesity. Responded well to IV lasix. Will restart his home furosemide today. -- Restarted home furosemide 60 mg BID -- Telemetry  Dispo: Anticipated discharge in approximately 1 day(s).   Mike Craze, DO 02/23/2019, 7:39 AM Pager: [phone]

## 2019-02-24 ENCOUNTER — Other Ambulatory Visit: Payer: Self-pay | Admitting: Internal Medicine

## 2019-02-24 LAB — GLUCOSE, CAPILLARY
Glucose-Capillary: 223 mg/dL — ABNORMAL HIGH (ref 70–99)
Glucose-Capillary: 302 mg/dL — ABNORMAL HIGH (ref 70–99)

## 2019-02-24 MED ORDER — INSULIN REGULAR HUMAN 100 UNIT/ML IJ SOLN: 10.0000 [IU] | Freq: Three times a day (TID) | INTRAMUSCULAR | 10 refills | Status: DC

## 2019-02-24 NOTE — Progress Notes (Signed)
   Subjective: No overnight events. Patient reports feeling well this morning. No acute complaints. All questions and concerns were addressed.   Objective:  Vital signs in last 24 hours: Vitals:   02/23/19 1430 02/23/19 2053 02/23/19 2240 02/24/19 0406  BP:  121/75    Pulse: 99 78 71 76  Resp: 11 19 14 15   Temp:  98 F (36.7 C)    TempSrc:  Oral    SpO2: 94% 95% 94% 96%  Weight:      Height:       Physical Exam: Gen: seen sitting up at the bedside, no distress, on 4L supplemental oxgen Skin: extensive psoarisis with plaques  Neuro: alert and oriented  Assessment/Plan:  Principal Problem:   Hypoglycemia Active Problems:   DM type 2 with diabetic peripheral neuropathy (HCC)   CHF (congestive heart failure) (HCC)   Altered mental status  Acute on Chronic Hypoxic and Hypercarbic Respiratory Failure:Due to obesity hypoventilation, OSA, and pulmonary edema on admission. He is requiring 4L supplemental oxygen. He will need to closely follow up with pulm outpatient; may need to be considered for a tracheostomy if he continues to require BiPAP so frequently. --Wean O2 as tolerated -- Continue BiPAP prn  -- Continue home Anoro daily and albuterol prn  Hypoglycemia:Unclear reason for new hypoglycemia. He is a diabetic on long standing insulin at high doses. Has required 22 units of short acting insulin in the past 24 hours. CBGs overnight 283.  -- Resume 10 units short acting insulin with meals at discharge   -- Hold long acting and scheduled insulin  -- SSI-modq4h while NPO  HFpEF Ischemic Cardiomyopathy -- Continue furosemide 60 mg BID -- Telemetry  Dispo: Patient is medically stable for discharge today.   Mike Craze, DO 02/24/2019, 6:51 AM Pager: [phone]

## 2019-02-24 NOTE — Discharge Instructions (Signed)
Use 4L supplemental oxygen at rest and 6L on ambulation

## 2019-02-24 NOTE — Discharge Summary (Signed)
Name: [name] MRN: [id number] DOB: 06/06/1966 53 y.o. PCP: Staci Acosta, NP  Date of Admission: 02/18/2019 12:25 PM Date of Discharge: 02/24/2019 Attending Physician: Axel Filler, *  Discharge Diagnosis: 1.  Hypoglycemia 2.  Altered mental status 3.  Type 2 diabetes mellitus 4.  Acute on chronic respiratory failure 5.  Congestive heart failure  Discharge Medications: Allergies as of 02/24/2019      Reactions   Shellfish Allergy Anaphylaxis   Face and throat swelling, difficulty breathing Allergy can be triggered by touching (contact)      Medication List    STOP taking these medications   insulin glargine 100 UNIT/ML injection Commonly known as:  Lantus     TAKE these medications   albuterol 108 (90 Base) MCG/ACT inhaler Commonly known as:  PROVENTIL HFA;VENTOLIN HFA Inhale 2 puffs into the lungs every 4 (four) hours as needed for wheezing or shortness of breath.   allopurinol 300 MG tablet Commonly known as:  ZYLOPRIM Take 1 tablet (300 mg total) by mouth daily.   aspirin EC 81 MG tablet Take 1 tablet (81 mg total) by mouth daily.   atorvastatin 40 MG tablet Commonly known as:  LIPITOR Take 1 tablet (40 mg total) by mouth at bedtime.   carvedilol 25 MG tablet Commonly known as:  COREG Take 1 tablet (25 mg total) by mouth every 12 (twelve) hours.   Cholecalciferol 25 MCG (1000 UT) tablet Commonly known as:  D3-1000 Take 2 tablets (2,000 Units total) by mouth daily.   cyclobenzaprine 5 MG tablet Commonly known as:  FLEXERIL Take 1 tablet (5 mg total) by mouth at bedtime as needed for muscle spasms.   Dulaglutide 0.75 MG/0.5ML Sopn Commonly known as:  Trulicity Inject 0.5 mg subcutaneously in the skin once a week to help with diabetes and weight loss What changed:    how much to take  how to take this  when to take this  additional instructions   furosemide 40 MG tablet Commonly known as:  LASIX Take 1.5 tablets (60 mg  total) by mouth 2 (two) times daily.   gabapentin 300 MG capsule Commonly known as:  NEURONTIN Take 2 capsules (600 mg total) by mouth 3 (three) times daily.   insulin regular 100 units/mL injection Commonly known as:  HumuLIN R Inject 0.1 mLs (10 Units total) into the skin 3 (three) times daily before meals. INKECT 10 UNITS INTO THE SKIN 3 TIMES A DAY BEFORE MEALS What changed:    how much to take  how to take this  when to take this  additional instructions   isosorbide mononitrate 60 MG 24 hr tablet Commonly known as:  IMDUR Take 1 tablet (60 mg total) by mouth daily.   magnesium oxide 400 MG tablet Commonly known as:  MAG-OX Take 1 tablet (400 mg total) by mouth daily.   metolazone 2.5 MG tablet Commonly known as:  ZAROXOLYN Take 1 tablet (2.5 mg total) by mouth as needed. What changed:    when to take this  reasons to take this   omeprazole 20 MG capsule Commonly known as:  PRILOSEC Take 1 capsule (20 mg total) by mouth 2 (two) times daily before a meal.   triamcinolone cream 0.1 % Commonly known as:  KENALOG APPLY 1 APPLICATON TOPICALLY 2 TIMES A DAY. What changed:    See the new instructions.  Another medication with the same name was removed. Continue taking this medication, and follow the directions you see here.  umeclidinium-vilanterol 62.5-25 MCG/INH Aepb Commonly known as:  ANORO ELLIPTA Inhale 1 puff into the lungs daily.            Durable Medical Equipment  (From admission, onward)         Start     Ordered   02/24/19 1325  For home use only DME oxygen  Once    Question Answer Comment  Mode or (Route) Nasal cannula   Liters per Minute 6   Frequency Continuous (stationary and portable oxygen unit needed)   Oxygen conserving device Yes   Oxygen delivery system Gas      02/24/19 1324          Disposition and follow-up:   [name] was discharged from [facility] in Stable condition.  At the  hospital follow up visit please address:  1.  Type II DM-patient was discharged to continue NovoLog 10 units 3 times a day with meals and to hold off on long-acting insulin.  Please reassess blood sugars and adjust insulin regimen as necessary  Acute on chronic hypercapnic respiratory failure- patient had increased supplemental oxygen need, he was discharged to continue 4 L at rest and 6 L on ambulation.  Plan to follow with [facility] pulmonology in [place].   2.  Labs / imaging needed at time of follow-up: CBG  3.  Pending labs/ test needing follow-up: none  Follow-up Appointments: Follow-up Information    Laverle Hobby, MD. Schedule an appointment as soon as possible for a visit in 1 week(s).   Specialty:  Pulmonary Disease Contact information: [address] [place] [address] [phone]        Doles-Johnson, Leonarda Salon, NP. Schedule an appointment as soon as possible for a visit in 1 week(s).   Contact information: [address] [address] [phone]           Hospital Course by problem list: 1.  Hypoglycemia 2.  Altered mental status- [name] is a 53 year old man with morbid obesity, OSA, HTN, type II DM, CKD, ischemic cardiomyopathy and CHF presenting with altered mental status. He was found unresponsive in his apartment, without his supplemental oxygen, eyes deviated to the right and with a lot of oral secretions. He was found to be hypoglycemic and started on a D50 infusion.  Likely in the setting of high doses of insulin at home with decreased p.o. intake.  He required very little short acting insulin during his hospital stay.  Discharged to continue 10 units short-acting insulin with meals and to hold long-acting insulin at this time.  Recommended very close follow-up with PCP within 1 week to adjust insulin regimen.  3.  Type 2 diabetes mellitus- unclear reason for new hypoglycemia, as above.  Please  make sure to address insulin regimen at hospital follow-up.  4.  Acute on chronic respiratory failure-Due to obesity hypoventilation, OSA, and pulmonary edema on admission. Patient had an increased need and supplemental oxygen during hospital stay.  He reported he was on chronic 3 L supplemental oxygen at home and using BiPAP at night.  He is requiring BiPAP throughout the day and discharged on 4 L supplemental oxygen at rest and 6 L on ambulation.  He was seen by PCCM during his hospital stay and they plan on following him outpatient at [facility] in [place].  5.  Congestive heart failure-last echo in 2017 showed EF of 45% with mild left ventricular  systolic dysfunction with diffuse hypokinesis, grade 1 diastolic dysfunction and trivial mitral and tricuspid regurgitation.  Chest x-ray on admission showed pulmonary vascular congestion and he was treated with IV furosemide 40 mg. Discharged to continue home medication, furosemide 60 mg BID.   Discharge Vitals:   BP (!) 167/94 (BP Location: Left Arm)   Pulse 74   Temp 98.1 F (36.7 C) (Oral)   Resp 18   Ht 5\' 3"  (1.6 m)   Wt 135.3 kg   SpO2 96%   BMI 52.84 kg/m   Pertinent Labs, Studies, and Procedures:   CBC Latest Ref Rng & Units 02/22/2019 02/18/2019 02/18/2019  WBC 4.0 - 10.5 K/uL 10.7(H) 16.3(H) -  Hemoglobin 13.0 - 17.0 g/dL 9.0(L) 10.2(L) 12.2(L)  Hematocrit 39.0 - 52.0 % 32.0(L) 35.7(L) 36.0(L)  Platelets 150 - 400 K/uL 227 233 -   CMP Latest Ref Rng & Units 02/22/2019 02/21/2019 02/20/2019  Glucose 70 - 99 mg/dL 165(H) 140(H) 192(H)  BUN 6 - 20 mg/dL 22(H) 21(H) 20  Creatinine 0.61 - 1.24 mg/dL 1.55(H) 1.57(H) 1.62(H)  Sodium 135 - 145 mmol/L 138 139 137  Potassium 3.5 - 5.1 mmol/L 4.1 4.9 4.5  Chloride 98 - 111 mmol/L 91(L) 95(L) 96(L)  CO2 22 - 32 mmol/L 40(H) 33(H) 33(H)  Calcium 8.9 - 10.3 mg/dL 9.5 9.2 8.8(L)  Total Protein 6.5 - 8.1 g/dL - - -  Total Bilirubin 0.3 - 1.2 mg/dL - - -  Alkaline Phos 38 - 126 U/L - - -    AST 15 - 41 U/L - - -  ALT 0 - 44 U/L - - -   CBG (last 3)  Recent Labs    02/23/19 2203 02/24/19 0747 02/24/19 1130  GLUCAP 280* 223* 302*   Dg Chest Portable 1 View  Result Date: 02/18/2019 CLINICAL DATA:  Respiratory distress.  History of CHF. EXAM: PORTABLE CHEST 1 VIEW COMPARISON:  Chest radiograph January 14, 2018 FINDINGS: Stable cardiomegaly. Similarly widened mediastinum attributable to AP technique. Pulmonary vascular congestion without pleural effusion or focal consolidation. Persistently elevated RIGHT hemidiaphragm. No pneumothorax. Soft tissue planes and included osseous structures are non suspicious. Large body habitus. IMPRESSION: 1. Stable cardiomegaly and pulmonary vascular congestion. Electronically Signed   By: Elon Alas M.D.   On: 02/18/2019 13:27   Discharge Instructions: Discharge Instructions    Diet - low sodium heart healthy   Complete by:  As directed    Diet - low sodium heart healthy   Complete by:  As directed    Discharge instructions   Complete by:  As directed    [name],  You were hospitalized due to altered mental status secondary to your low blood sugars. You were also found to have acute worsening of your chronic respiratory failure. You made great recovery during your hospital stay as you were treated with much less insulin and BiPAP.   I want you to note the changes we are making to your insulin regimen and make sure to closely follow up with your PCP within 1 week. Here are the changes to your insulin I want you to make: - STOP taking Lantus 90 units twice a day, until you follow up with your PCP - STOP taking Novolog 50 units three times a day with meals - START taking Novolog 10 units three times a day with meals  - Continue taking all of your other medications as prescribed  I want you to follow up with your PCP within one week to  check your blood sugars and adjust your insulin regimen as needed. If you notice your blood sugars  are remaining elevated, above 200, I want you to call your PCP as soon as possible so they can help you determine how to adjust your insulin regimen.  I have also sent in an order for 4L supplemental oxygen. Please make sure to continue using 4L of supplemental oxygen at home. I want you to follow up with the lung doctor in 1 week. Please call to make an appointment as soon as possible.  It was a pleasure meeting you and I am glad to see you are doing so much better! Thank you for allowing [place] to be a part of your care!   Increase activity slowly   Complete by:  As directed    Increase activity slowly   Complete by:  As directed       Signed: Rehman, Areeg N, DO 02/24/2019, 3:14 PM

## 2019-02-24 NOTE — Progress Notes (Signed)
Inpatient Diabetes Program Recommendations  AACE/ADA: New Consensus Statement on Inpatient Glycemic Control (2015)  Target Ranges:  Prepandial:   less than 140 mg/dL      Peak postprandial:   less than 180 mg/dL (1-2 hours)      Critically ill patients:  140 - 180 mg/dL   Results for ROCKLAND, KOTARSKI (MRN [id number]) as of 02/24/2019 10:55  Ref. Range 02/23/2019 07:58 02/23/2019 11:59 02/23/2019 17:00 02/23/2019 22:03 02/24/2019 07:47  Glucose-Capillary Latest Ref Range: 70 - 99 mg/dL 171 (H) 283 (H) 172 (H) 280 (H) 223 (H)   Review of Glycemic Control  Diabetes history: DM 2 Outpatient Diabetes medications: Trulicity 0.5 mg QThursday, Lantus 90 units BID, Humulin R 50 units tid Current orders for Inpatient glycemic control: Metformin 500 mg Daily, Novolog 0-20 units tid, Novolog 0-5 units qhs  Inpatient Diabetes Program Recommendations:    Patient on basal insulin at home. Glucose trends above inpatient goal. Consider adding low dose basal insulin while here, Lantus 15 units (0.1 units/kg).  Thanks,  Tama Headings RN, MSN, BC-ADM Inpatient Diabetes Coordinator Team Pager [phone] (8a-5p)

## 2019-02-24 NOTE — Progress Notes (Addendum)
SATURATION QUALIFICATIONS: (This note is used to comply with regulatory documentation for home oxygen)  Patient Saturations on Room Air at Rest = below 84%  Pt required O2 4L via [place] to bring saturations up to 97%.  Patient Saturations on 6 Liters of oxygen while Ambulating = 90%  Please briefly explain why patient needs home oxygen: Pt's O2 sat drops below 84% quickly after removing O2 via [place]. Pt required 6L of O2 via  during ambulation to keep saturations above 88%.  Idolina Primer, RN

## 2019-02-27 ENCOUNTER — Other Ambulatory Visit: Payer: Self-pay | Admitting: Adult Health Nurse Practitioner

## 2019-02-27 DIAGNOSIS — I270 Primary pulmonary hypertension: Secondary | ICD-10-CM

## 2019-03-02 NOTE — Progress Notes (Signed)
[facility] Consultation      Assessment and Plan:  Dyspnea, possible asthma.  - Dyspnea likely multifactorial secondary to morbid obesity, as well as ischemic cardiomyopathy.  - Continue nebulizer, Anoro.  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  - Reviewed patient's previous chemistry and arterial blood gas testing which appear consistent with chronic hypercapnic respiratory failure, which is likely from obesity hypoventilation syndrome. - Continue on BiPAP at night, continue on oxygen with BiPAP.  Will need to monitor, as patient is at high risk of decompensation, complications due to obesity hypoventilation syndrome and concomitant heart failure. - Patient was ambulated on 3 L, with oxygen saturation of 89% or better.  Continue with 3 L nasal cannula at rest and with activity.  Patient is asked to get himself and oxygen saturation, he can adjust his oxygen as needed to keep his oxygen saturation at 90% or above. Morbid obesity. - BMI 56, discussed that obesity is a major contributor to his dyspnea. -Recommend weight loss.  Return in about 6 months (around 09/03/2019).   Date: 03/02/2019  MRN# [id number] [name] Apr 26, 1966   [name] is a 53 y.o. old male seen in consultation for chief complaint of:    Chief Complaint  Patient presents with  . Hospitalization Follow-up    d/c 01/26/19- pt states breathing has baseline since discharge, c/o sob with exertion, non prod cough, nasal drainage clear in color mixed with blood & wheezing when laying on right side. on 5L with exertion and 4L at rest.     HPI:  The patient is a 53 year old male, his past medical history includes hypertension, sleep apnea, and SVT, hypertension, psoriasis, diabetes mellitus, chronic kidney disease, CHF, Bell's palsy, ischemic cardiomyopathy.  He has morbid obesity with a BMI of 56.  At last visit he was asked to use  Anoro empirically to see if this help with his breathing.  He was asked to continue nightly BiPAP and 3 L of oxygen chronically.  Since his last visit he was admitted to the hospital for acute hypoglycemia.  He was discharged on 4 L of oxygen at rest, 6 L with activity.  Currently he feels that his breathing feels ok, about back to normal. He feels that the oxygen is too high and would like to go down to 4L. His sat was 96% currently.   **desat walk 03/03/19>> baseline at rest on 3L sat is 98% and HR 72. Walked 180 feet at slow pace with 3L oxygen, mild dyspnea. Sat at end of walk was 89% and HR 89.   **Chest x-ray 01/14/2018>> images personally reviewed, cardiomegaly, reduced lung volumes secondary to obesity.  Elevated right diaphragm.  Lungs are otherwise unremarkable. **CBC 07/29/2018>> bili eosinophil count 300 **Echocardiogram [facility] 04/28/18>>THE LEFT VENTRICLE IS DILATED AND SIGNIFICANT LEFT VENTRICULAR SYSTOLIC DYSFUNCTION IS PRESENT BUT THE EF CANNOT BE ADEQUATELY ASSESSED BECAUSE NOT ALL WALL MOTION COULD BE VISUALIZED. LIKEWISE THE CARDIAC VALVES WERE NEVER ADEQUATELY VISUALIZED.  Results for [name] (MRN [id number]) as of 09/05/2018 11:47  Ref. Range 01/13/2018 05:00 01/13/2018 05:30 01/14/2018 06:34 06/12/2018 19:38 07/29/2018 18:45  CO2 Latest Ref Range: 20 - 29 mmol/L  40 (H) 33 (H) 29    Results for [name] (MRN [id number]) as of 09/05/2018 11:47  Ref. Range 01/12/2018 20:25  pH, Arterial Latest Ref Range: 7.350 - 7.450  7.41  pCO2 arterial Latest Ref Range: 32.0 - 48.0 mmHg 73 (HH)  pO2, Arterial  Latest Ref Range: 83.0 - 108.0 mmHg 62 (L)  Acid-Base Excess Latest Ref Range: 0.0 - 2.0 mmol/L 18.6 (H)  Bicarbonate Latest Ref Range: 20.0 - 28.0 mmol/L 46.3 (H)  O2 Saturation Latest Units: % 91.6  Patient temperature Unknown 37.0   Medication:    Current Outpatient Medications:  .  albuterol (PROVENTIL HFA;VENTOLIN HFA) 108 (90 Base) MCG/ACT inhaler, Inhale 2 puffs  into the lungs every 4 (four) hours as needed for wheezing or shortness of breath., Disp: 6.7 g, Rfl: 5 .  allopurinol (ZYLOPRIM) 300 MG tablet, Take 1 tablet (300 mg total) by mouth daily., Disp: 90 tablet, Rfl: 4 .  aspirin EC 81 MG tablet, TAKE ONE TABLET BY MOUTH EVERY DAY, Disp: 90 tablet, Rfl: 0 .  atorvastatin (LIPITOR) 40 MG tablet, Take 1 tablet (40 mg total) by mouth at bedtime., Disp: 90 tablet, Rfl: 3 .  carvedilol (COREG) 25 MG tablet, Take 1 tablet (25 mg total) by mouth every 12 (twelve) hours., Disp: 180 tablet, Rfl: 3 .  Cholecalciferol (D3-1000) 25 MCG (1000 UT) tablet, Take 2 tablets (2,000 Units total) by mouth daily., Disp: 60 tablet, Rfl: 3 .  cyclobenzaprine (FLEXERIL) 5 MG tablet, TAKE ONE TABLET BY MOUTH AT BEDTIME AS NEEDED FOR MUSCLE SPASMS, Disp: 30 tablet, Rfl: 0 .  Dulaglutide (TRULICITY) 0.08 QP/6.1PJ SOPN, Inject 0.5 mg subcutaneously in the skin once a week to help with diabetes and weight loss (Patient taking differently: Inject 0.5 mg into the skin every 7 (seven) days. THURSDAYS), Disp: 12 pen, Rfl: 2 .  furosemide (LASIX) 40 MG tablet, Take 1.5 tablets (60 mg total) by mouth 2 (two) times daily., Disp: 90 tablet, Rfl: 3 .  gabapentin (NEURONTIN) 300 MG capsule, Take 2 capsules (600 mg total) by mouth 3 (three) times daily., Disp: 540 capsule, Rfl: 3 .  insulin regular (HUMULIN R) 100 units/mL injection, Inject 0.1 mLs (10 Units total) into the skin 3 (three) times daily before meals. INKECT 10 UNITS INTO THE SKIN 3 TIMES A DAY BEFORE MEALS, Disp: 40 mL, Rfl: 10 .  isosorbide mononitrate (IMDUR) 60 MG 24 hr tablet, Take 1 tablet (60 mg total) by mouth daily., Disp: 30 tablet, Rfl: 3 .  magnesium oxide (MAG-OX) 400 MG tablet, Take 1 tablet (400 mg total) by mouth daily., Disp: 30 tablet, Rfl: 3 .  metolazone (ZAROXOLYN) 2.5 MG tablet, Take 1 tablet (2.5 mg total) by mouth as needed. (Patient taking differently: Take 2.5 mg by mouth daily as needed (bloating or  swelling). ), Disp: 20 tablet, Rfl: 2 .  omeprazole (PRILOSEC) 20 MG capsule, Take 1 capsule (20 mg total) by mouth 2 (two) times daily before a meal., Disp: 180 capsule, Rfl: 2 .  triamcinolone cream (KENALOG) 0.1 %, APPLY 1 APPLICATON TOPICALLY 2 TIMES A DAY. (Patient taking differently: Apply 1 application topically 2 (two) times daily. ), Disp: 454 g, Rfl: 0 .  umeclidinium-vilanterol (ANORO ELLIPTA) 62.5-25 MCG/INH AEPB, Inhale 1 puff into the lungs daily., Disp: 1 each, Rfl: 5   Allergies:  Shellfish allergy  Review of Systems:  Constitutional: Feels well. Cardiovascular: Denies chest pain, exertional chest pain.  Pulmonary: Denies hemoptysis, pleuritic chest pain.   The remainder of systems were reviewed and were found to be negative other than what is documented in the HPI.    Physical Examination:   VS: BP (!) 146/80 (BP Location: Left Arm, Cuff Size: Normal)   Pulse 75   Ht 5\' 3"  (1.6 m)   Wt 296  lb (134.3 kg)   SpO2 96%   BMI 52.43 kg/m   General Appearance: No distress, obese, wearing a mask.  Neuro:without focal findings, mental status, speech normal, alert and oriented HEENT: PERRLA, EOM intact Pulmonary: No wheezing, No rales  CardiovascularNormal S1,S2.  No m/r/g.  Abdomen: Benign, Soft, non-tender, No masses Renal:  No costovertebral tenderness  GU:  No performed at this time. Endoc: No evident thyromegaly, no signs of acromegaly or Cushing features Skin:   warm, no rashes, no ecchymosis  Extremities: normal, no cyanosis, clubbing.      LABORATORY PANEL:   CBC No results for input(s): WBC, HGB, HCT, PLT in the last 168 hours. ------------------------------------------------------------------------------------------------------------------  Chemistries  No results for input(s): NA, K, CL, CO2, GLUCOSE, BUN, CREATININE, CALCIUM, MG, AST, ALT, ALKPHOS, BILITOT in the last 168 hours.  Invalid input(s): GFRCGP  ------------------------------------------------------------------------------------------------------------------  Cardiac Enzymes No results for input(s): TROPONINI in the last 168 hours. ------------------------------------------------------------  RADIOLOGY:  No results found.     Thank  you for the consultation and for allowing [facility] to assist in the care of your patient. Our recommendations are noted above.  Please contact us if we can be of further service.   Marda Stalker, M.D., F.C.C.P.  Board Certified in Internal Medicine, Pulmonary Medicine, [place], and Sleep Medicine.  [facility] Office Number: [phone]   03/02/2019

## 2019-03-03 ENCOUNTER — Other Ambulatory Visit: Payer: Self-pay

## 2019-03-03 ENCOUNTER — Ambulatory Visit (INDEPENDENT_AMBULATORY_CARE_PROVIDER_SITE_OTHER): Payer: Medicaid Other | Admitting: Internal Medicine

## 2019-03-03 ENCOUNTER — Encounter: Payer: Self-pay | Admitting: Internal Medicine

## 2019-03-03 VITALS — BP 146/80 | HR 75 | Ht 63.0 in | Wt 296.0 lb

## 2019-03-03 DIAGNOSIS — J4540 Moderate persistent asthma, uncomplicated: Secondary | ICD-10-CM | POA: Diagnosis not present

## 2019-03-03 NOTE — Patient Instructions (Addendum)
Can decrease oxygen to 3L at rest and with activity.  Recommend that you get an oxygen saturation device, you should to keep your oxygen saturation at 90% or above.  Continue current inhaler.  Continue bipap at night.

## 2019-03-05 ENCOUNTER — Other Ambulatory Visit: Payer: Self-pay

## 2019-03-05 ENCOUNTER — Ambulatory Visit: Payer: Medicaid Other | Admitting: Adult Health Nurse Practitioner

## 2019-03-05 VITALS — BP 144/89 | HR 70 | Temp 97.4°F | Ht 63.39 in | Wt 297.8 lb

## 2019-03-05 DIAGNOSIS — Z794 Long term (current) use of insulin: Secondary | ICD-10-CM

## 2019-03-05 DIAGNOSIS — R309 Painful micturition, unspecified: Secondary | ICD-10-CM

## 2019-03-05 DIAGNOSIS — R0602 Shortness of breath: Secondary | ICD-10-CM

## 2019-03-05 DIAGNOSIS — N183 Chronic kidney disease, stage 3 unspecified: Secondary | ICD-10-CM

## 2019-03-05 DIAGNOSIS — E1165 Type 2 diabetes mellitus with hyperglycemia: Secondary | ICD-10-CM

## 2019-03-05 DIAGNOSIS — Z202 Contact with and (suspected) exposure to infections with a predominantly sexual mode of transmission: Secondary | ICD-10-CM

## 2019-03-05 MED ORDER — MAGNESIUM OXIDE 400 MG PO TABS: 400.0000 mg | ORAL_TABLET | Freq: Every day | ORAL | 3 refills | Status: DC

## 2019-03-05 NOTE — Addendum Note (Signed)
Addended by: Regis Bill on: 03/05/2019 07:51 PM   Modules accepted: Orders

## 2019-03-05 NOTE — Progress Notes (Signed)
Patient: [name] Male    DOB: 1966/04/21   53 y.o.   MRN: [id number] Visit Date: 03/05/2019  Today's Provider: [place]   Chief Complaint  Patient presents with  . Follow-up    recent hospital visit f/u   Subjective:    HPI   Recently seen in the hospital for hypoglycemia- Lantus was discontinued. Humulin was decreased to 10 units TID. Denies symptomatic hypoglycemia. Does not have a glucometer currently.   Seen by Pulmonology on Tuesday- recommendation to continue BiPAP at night and continuous 3 LNC. Denies SOB at resting.   Would like referral for home health. Needs nebulizer and pulse oximeter.   Allergies  Allergen Reactions  . Shellfish Allergy Anaphylaxis    Face and throat swelling, difficulty breathing Allergy can be triggered by touching (contact)   Previous Medications   ALBUTEROL (PROVENTIL HFA;VENTOLIN HFA) 108 (90 BASE) MCG/ACT INHALER    Inhale 2 puffs into the lungs every 4 (four) hours as needed for wheezing or shortness of breath.   ALLOPURINOL (ZYLOPRIM) 300 MG TABLET    Take 1 tablet (300 mg total) by mouth daily.   ASPIRIN EC 81 MG TABLET    TAKE ONE TABLET BY MOUTH EVERY DAY   ATORVASTATIN (LIPITOR) 40 MG TABLET    Take 1 tablet (40 mg total) by mouth at bedtime.   CARVEDILOL (COREG) 25 MG TABLET    Take 1 tablet (25 mg total) by mouth every 12 (twelve) hours.   CHOLECALCIFEROL (D3-1000) 25 MCG (1000 UT) TABLET    Take 2 tablets (2,000 Units total) by mouth daily.   CYCLOBENZAPRINE (FLEXERIL) 5 MG TABLET    TAKE ONE TABLET BY MOUTH AT BEDTIME AS NEEDED FOR MUSCLE SPASMS   DULAGLUTIDE (TRULICITY) 4.09 WJ/1.9JY SOPN    Inject 0.5 mg subcutaneously in the skin once a week to help with diabetes and weight loss   FUROSEMIDE (LASIX) 40 MG TABLET    Take 1.5 tablets (60 mg total) by mouth 2 (two) times daily.   GABAPENTIN (NEURONTIN) 300 MG CAPSULE    Take 2 capsules (600 mg total) by mouth 3 (three) times daily.   INSULIN REGULAR (HUMULIN  R) 100 UNITS/ML INJECTION    Inject 0.1 mLs (10 Units total) into the skin 3 (three) times daily before meals. INKECT 10 UNITS INTO THE SKIN 3 TIMES A DAY BEFORE MEALS   ISOSORBIDE MONONITRATE (IMDUR) 60 MG 24 HR TABLET    Take 1 tablet (60 mg total) by mouth daily.   MAGNESIUM OXIDE (MAG-OX) 400 MG TABLET    Take 1 tablet (400 mg total) by mouth daily.   METOLAZONE (ZAROXOLYN) 2.5 MG TABLET    Take 1 tablet (2.5 mg total) by mouth as needed.   OMEPRAZOLE (PRILOSEC) 20 MG CAPSULE    Take 1 capsule (20 mg total) by mouth 2 (two) times daily before a meal.   TRIAMCINOLONE CREAM (KENALOG) 0.1 %    APPLY 1 APPLICATON TOPICALLY 2 TIMES A DAY.   UMECLIDINIUM-VILANTEROL (ANORO ELLIPTA) 62.5-25 MCG/INH AEPB    Inhale 1 puff into the lungs daily.    Review of Systems  All other systems reviewed and are negative.   Social History   Tobacco Use  . Smoking status: Never Smoker  . Smokeless tobacco: Never Used  Substance Use Topics  . Alcohol use: Yes    Alcohol/week: 2.0 standard drinks    Types: 1 Shots of liquor, 1 Cans of beer per week    Comment:  very rarely 1-2 times per month   Objective:   BP (!) 144/89 (BP Location: Left Wrist, Patient Position: Sitting, Cuff Size: Large)   Pulse 70   Temp (!) 97.4 F (36.3 C) (Oral)   Ht 5' 3.39" (1.61 m)   Wt 297 lb 12.8 oz (135.1 kg)   BMI 52.11 kg/m   Physical Exam      Assessment & Plan:     DM:   Last A1c 7.9 -4 months ago.   Encourage diabetic diet and exercise.  Continue current medication regimen.  Give a new meter. Check CBGs TID before taking insulin.   BP slightly elevated.  Will re-evaluate BP at next visit for increase in medications.   Will look into home health, nebulizer and pulse oximeter.     Will attempt labs tonight- very difficult stick.  [facility]

## 2019-03-05 NOTE — Addendum Note (Signed)
Addended by: Reino Kent on: 03/05/2019 07:22 PM   Modules accepted: Orders

## 2019-03-06 LAB — URINALYSIS
Bilirubin, UA: NEGATIVE
Glucose, UA: NEGATIVE
Ketones, UA: NEGATIVE
Leukocytes, UA: NEGATIVE
Nitrite, UA: NEGATIVE
Protein, UA: NEGATIVE
RBC, UA: NEGATIVE
Specific Gravity, UA: 1.018 (ref 1.005–1.030)
Urobilinogen, Ur: 1 mg/dL (ref 0.2–1.0)
pH, UA: 5 (ref 5.0–7.5)

## 2019-03-23 ENCOUNTER — Telehealth: Payer: Self-pay | Admitting: Pharmacist

## 2019-03-24 ENCOUNTER — Telehealth: Payer: Self-pay | Admitting: Pharmacy Technician

## 2019-03-24 NOTE — Telephone Encounter (Signed)
Patient has prescription drug coverage through a Medicare Part D plan.  Patient no longer meets [facility]'s eligibility criteria.  [facility]

## 2019-03-30 ENCOUNTER — Ambulatory Visit: Payer: Medicaid Other | Admitting: Podiatry

## 2019-04-03 DIAGNOSIS — I10 Essential (primary) hypertension: Secondary | ICD-10-CM | POA: Diagnosis not present

## 2019-04-03 DIAGNOSIS — N183 Chronic kidney disease, stage 3 (moderate): Secondary | ICD-10-CM | POA: Diagnosis not present

## 2019-04-03 DIAGNOSIS — E1122 Type 2 diabetes mellitus with diabetic chronic kidney disease: Secondary | ICD-10-CM | POA: Diagnosis not present

## 2019-04-03 DIAGNOSIS — L409 Psoriasis, unspecified: Secondary | ICD-10-CM | POA: Diagnosis not present

## 2019-04-03 DIAGNOSIS — E1165 Type 2 diabetes mellitus with hyperglycemia: Secondary | ICD-10-CM | POA: Diagnosis not present

## 2019-04-03 DIAGNOSIS — I509 Heart failure, unspecified: Secondary | ICD-10-CM | POA: Diagnosis not present

## 2019-04-14 ENCOUNTER — Telehealth: Payer: Self-pay

## 2019-04-14 NOTE — Telephone Encounter (Signed)
Shelby from [facility] called requesting to speak with Teah regarding any changes that were made to [name]'s medications. I reviewed Teah's visit note from 03/05/2019.  [name] states that [name] does not know how to use his glucometer. I let [name] know that we will not let [name] run out of his medications. He needs to let us know what pharmacy he would like to use other than medication management.

## 2019-04-21 ENCOUNTER — Other Ambulatory Visit: Payer: Self-pay | Admitting: Internal Medicine

## 2019-04-21 ENCOUNTER — Ambulatory Visit (INDEPENDENT_AMBULATORY_CARE_PROVIDER_SITE_OTHER): Payer: Medicare Other | Admitting: Internal Medicine

## 2019-04-21 ENCOUNTER — Other Ambulatory Visit: Payer: Self-pay

## 2019-04-21 ENCOUNTER — Encounter: Payer: Self-pay | Admitting: Internal Medicine

## 2019-04-21 DIAGNOSIS — E1165 Type 2 diabetes mellitus with hyperglycemia: Secondary | ICD-10-CM | POA: Diagnosis not present

## 2019-04-21 DIAGNOSIS — D519 Vitamin B12 deficiency anemia, unspecified: Secondary | ICD-10-CM | POA: Diagnosis not present

## 2019-04-21 DIAGNOSIS — I5032 Chronic diastolic (congestive) heart failure: Secondary | ICD-10-CM

## 2019-04-21 DIAGNOSIS — R0602 Shortness of breath: Secondary | ICD-10-CM | POA: Diagnosis not present

## 2019-04-21 DIAGNOSIS — D529 Folate deficiency anemia, unspecified: Secondary | ICD-10-CM | POA: Diagnosis not present

## 2019-04-21 DIAGNOSIS — G4733 Obstructive sleep apnea (adult) (pediatric): Secondary | ICD-10-CM

## 2019-04-21 DIAGNOSIS — I10 Essential (primary) hypertension: Secondary | ICD-10-CM | POA: Diagnosis not present

## 2019-04-21 DIAGNOSIS — N183 Chronic kidney disease, stage 3 unspecified: Secondary | ICD-10-CM

## 2019-04-21 DIAGNOSIS — J4540 Moderate persistent asthma, uncomplicated: Secondary | ICD-10-CM

## 2019-04-21 DIAGNOSIS — Z125 Encounter for screening for malignant neoplasm of prostate: Secondary | ICD-10-CM

## 2019-04-21 DIAGNOSIS — L409 Psoriasis, unspecified: Secondary | ICD-10-CM

## 2019-04-21 DIAGNOSIS — Z202 Contact with and (suspected) exposure to infections with a predominantly sexual mode of transmission: Secondary | ICD-10-CM | POA: Diagnosis not present

## 2019-04-21 DIAGNOSIS — E538 Deficiency of other specified B group vitamins: Secondary | ICD-10-CM | POA: Diagnosis not present

## 2019-04-21 LAB — POCT GLYCOSYLATED HEMOGLOBIN (HGB A1C): Hemoglobin A1C: 7.7 % — AB (ref 4.0–5.6)

## 2019-04-21 MED ORDER — HYDRALAZINE HCL 10 MG PO TABS: ORAL_TABLET | ORAL | 3 refills | Status: DC

## 2019-04-21 MED ORDER — GLUCOSE BLOOD VI STRP: ORAL_STRIP | 11 refills | Status: DC

## 2019-04-21 MED ORDER — ACCU-CHEK FASTCLIX LANCETS MISC: 11 refills | Status: DC

## 2019-04-21 NOTE — Progress Notes (Signed)
[facility] [place], [place] [address]  Internal MEDICINE  Office Visit Note  Patient Name: [name]  [id number]  [id number]  Date of Service: 04/21/2019   Complaints/HPI Pt is here for establishment of PCP. Chief Complaint  Patient presents with  . Diabetes    new patient establish care,   . Congestive Heart Failure  . Hypertension    bp is elevated   . Quality Metric Gaps    colonoscopy ,    HPI Pt is here for establishment of PCP. Multiple chronic problems, recent hypoglycemic event was hospitalized, multiple medications were changed 1. CHF ? Ischemic, due to uncontrolled DM, or LVH related to uncontrolled HTN, will get records, pt is on home O2 AND BiPAP, Needs a shoulder light weight bag to carry O2 2. Diabetes target hg a1c is not achieved 3. Severe Psoriasis, open wound  4. Uncontrolled HTN 5. Asthma controlled with MDI 6. CAD 7. Chronic kidney disease   Pt has not seen any consultants due to lack of health insurance, he admits to sob and hard time dragging O2 tank  Current Medication: Outpatient Encounter Medications as of 04/21/2019  Medication Sig  . albuterol (PROVENTIL HFA;VENTOLIN HFA) 108 (90 Base) MCG/ACT inhaler Inhale 2 puffs into the lungs every 4 (four) hours as needed for wheezing or shortness of breath.  . allopurinol (ZYLOPRIM) 300 MG tablet Take 1 tablet (300 mg total) by mouth daily.  [name] Kitchen aspirin EC 81 MG tablet TAKE ONE TABLET BY MOUTH EVERY DAY  . atorvastatin (LIPITOR) 40 MG tablet Take 1 tablet (40 mg total) by mouth at bedtime.  . carvedilol (COREG) 25 MG tablet Take 1 tablet (25 mg total) by mouth every 12 (twelve) hours.  . Cholecalciferol (D3-1000) 25 MCG (1000 UT) tablet Take 2 tablets (2,000 Units total) by mouth daily.  . cyclobenzaprine (FLEXERIL) 5 MG tablet TAKE ONE TABLET BY MOUTH AT BEDTIME AS NEEDED FOR MUSCLE SPASMS  . Dulaglutide (TRULICITY) 9.52 WU/1.3KG SOPN Inject 0.5 mg subcutaneously in the skin  once a week to help with diabetes and weight loss (Patient taking differently: Inject 0.5 mg into the skin every 7 (seven) days. THURSDAYS)  . furosemide (LASIX) 40 MG tablet Take 1.5 tablets (60 mg total) by mouth 2 (two) times daily.  [name] Kitchen gabapentin (NEURONTIN) 600 MG tablet Take 600 mg by mouth 3 (three) times daily.  . isosorbide mononitrate (IMDUR) 60 MG 24 hr tablet Take 1 tablet (60 mg total) by mouth daily.  . magnesium oxide (MAG-OX) 400 MG tablet Take 1 tablet (400 mg total) by mouth daily.  . metolazone (ZAROXOLYN) 2.5 MG tablet Take 1 tablet (2.5 mg total) by mouth as needed. (Patient taking differently: Take 2.5 mg by mouth daily as needed (bloating or swelling). )  . omeprazole (PRILOSEC) 20 MG capsule Take 1 capsule (20 mg total) by mouth 2 (two) times daily before a meal.  . OXYGEN Inhale 3 L into the lungs.  . triamcinolone cream (KENALOG) 0.1 % APPLY 1 APPLICATON TOPICALLY 2 TIMES A DAY. (Patient taking differently: Apply 1 application topically 2 (two) times daily. )  . triamcinolone ointment (KENALOG) 0.5 % Apply 1 application topically 2 (two) times daily.  [name] Kitchen umeclidinium-vilanterol (ANORO ELLIPTA) 62.5-25 MCG/INH AEPB Inhale 1 puff into the lungs daily.  . [DISCONTINUED] insulin regular (HUMULIN R) 100 units/mL injection Inject 0.1 mLs (10 Units total) into the skin 3 (three) times daily before meals. INKECT 10 UNITS INTO THE SKIN 3 TIMES A  DAY BEFORE MEALS  . hydrALAZINE (APRESOLINE) 10 MG tablet Take one tab po bid for blood pressure  . [DISCONTINUED] gabapentin (NEURONTIN) 300 MG capsule Take 2 capsules (600 mg total) by mouth 3 (three) times daily.  . [DISCONTINUED] insulin glargine (LANTUS) 100 UNIT/ML injection Inject into the skin.   No facility-administered encounter medications on file as of 04/21/2019.     Surgical History: Past Surgical History:  Procedure Laterality Date  . CORONARY ANGIOPLASTY WITH STENT PLACEMENT    . NO PAST SURGERIES     Medical  History: Past Medical History:  Diagnosis Date  . Bell's palsy   . CHF (congestive heart failure) ([place])   . Chronic kidney disease   . Diabetes mellitus without complication ([place])   . Hypertension   . NSVT (nonsustained ventricular tachycardia) ([place])   . Obstructive sleep apnea   . Psoriasis   . Pulmonary HTN ([place])    Family History: Family History  Problem Relation Age of Onset  . Heart failure Mother   . Kidney failure Brother    Social History   Socioeconomic History  . Marital status: Single    Spouse name: Not on file  . Number of children: Not on file  . Years of education: Not on file  . Highest education level: Not on file  Occupational History  . Not on file  Social Needs  . Financial resource strain: Somewhat hard  . Food insecurity:    Worry: Often true    Inability: Often true  . Transportation needs:    Medical: Yes    Non-medical: Yes  Tobacco Use  . Smoking status: Never Smoker  . Smokeless tobacco: Never Used  Substance and Sexual Activity  . Alcohol use: Yes    Alcohol/week: 2.0 standard drinks    Types: 1 Shots of liquor, 1 Cans of beer per week    Comment: very rarely 1-2 times per month  . Drug use: No  . Sexual activity: Not on file  Lifestyle  . Physical activity:    Days per week: 0 days    Minutes per session: 0 min  . Stress: Rather much  Relationships  . Social connections:    Talks on phone: Not on file    Gets together: Not on file    Attends religious service: Not on file    Active member of club or organization: Not on file    Attends meetings of clubs or organizations: Not on file    Relationship status: Not on file  . Intimate partner violence:    Fear of current or ex partner: Not on file    Emotionally abused: Not on file    Physically abused: Not on file    Forced sexual activity: Not on file  Other Topics Concern  . Not on file  Social History Narrative  . Not on file   Review of Systems  Constitutional:  Negative for chills, fatigue and unexpected weight change.  HENT: Negative for congestion, rhinorrhea, sneezing and sore throat.   Eyes: Negative for redness.  Respiratory: Positive for apnea. Negative for cough, chest tightness and shortness of breath.   Cardiovascular: Negative for chest pain and palpitations.  Gastrointestinal: Negative for abdominal pain, constipation, diarrhea, nausea and vomiting.  Genitourinary: Negative for dysuria and frequency.  Musculoskeletal: Positive for arthralgias and gait problem. Negative for back pain, joint swelling and neck pain.  Skin: Positive for rash.  Neurological: Positive for weakness. Negative for tremors and numbness.  Hematological:  Negative for adenopathy. Does not bruise/bleed easily.  Psychiatric/Behavioral: Negative for behavioral problems (Depression), sleep disturbance and suicidal ideas. The patient is not nervous/anxious.    Vital Signs: BP (!) 160/102   Pulse 81   Resp 16   Ht 5\' 3"  (1.6 m)   Wt 285 lb (129.3 kg)   SpO2 99%   BMI 50.49 kg/m   Physical Exam Constitutional:      Appearance: Normal appearance. He is obese.  HENT:     Head: Normocephalic and atraumatic.  Eyes:     Extraocular Movements: Extraocular movements intact.     Pupils: Pupils are equal, round, and reactive to light.  Cardiovascular:     Rate and Rhythm: Normal rate and regular rhythm.     Pulses: Normal pulses.     Heart sounds: Normal heart sounds.  Pulmonary:     Effort: Pulmonary effort is normal. No respiratory distress.     Breath sounds: No wheezing.  Abdominal:     Palpations: Abdomen is soft.  Skin:    General: Skin is warm.     Findings: Erythema, lesion and rash present.  Neurological:     General: No focal deficit present.     Mental Status: He is alert and oriented to person, place, and time.  Psychiatric:        Mood and Affect: Mood normal.        Behavior: Behavior normal.        Thought Content: Thought content normal.         Judgment: Judgment normal.    Assessment/Plan: 1. Uncontrolled type 2 diabetes mellitus with hyperglycemia (HCC) - DC R insulin, start Triseba 10 units before supper, increase trulicity to 1.5 mg qweek. - POCT HgB A1C - check micro albumin  2. Moderate persistent asthma, unspecified whether complicated - Pulmonary Function Test; Future - For home use only DME oxygen - Continue all MDI as before   3. Chronic diastolic congestive heart failure ([place]) - Continue Bipap, will need ECHO on next visit. Pt is on lasix, metolazone but not on Ace inh   4. Screening for prostate cancer - PSA ordered   5. Psoriasis - Ambulatory referral to Dermatology  6. Venereal disease contact - HIV, RPR ordered   7. SOB (shortness of breath) - 6 minute walk; Future  8. Chronic kidney disease, stage III (moderate) (HCC) - Monitor for now   9. Obstructive sleep apnea (adult) (pediatric) - Bipap as before   10. Benign hypertension - Add Hydralazine 10 mg bid continue coreg as before   General Counseling: Malikye verbalizes understanding of the findings of todays visit and agrees with plan of treatment. I have discussed any further diagnostic evaluation that may be needed or ordered today. We also reviewed his medications today. he has been encouraged to call the office with any questions or concerns that should arise related to todays visit.  Orders Placed This Encounter  Procedures  . For home use only DME oxygen  . Ambulatory referral to Dermatology  . POCT HgB A1C  . Pulmonary Function Test  . 6 minute walk   Meds ordered this encounter  Medications  . hydrALAZINE (APRESOLINE) 10 MG tablet    Sig: Take one tab po bid for blood pressure    Dispense:  90 tablet    Refill:  3   Time spent:45 Minutes

## 2019-04-22 LAB — CBC WITH DIFFERENTIAL/PLATELET
Basophils Absolute: 0.1 10*3/uL (ref 0.0–0.2)
Basos: 0 %
EOS (ABSOLUTE): 0.5 10*3/uL — ABNORMAL HIGH (ref 0.0–0.4)
Eos: 4 %
Hematocrit: 36.1 % — ABNORMAL LOW (ref 37.5–51.0)
Hemoglobin: 10.8 g/dL — ABNORMAL LOW (ref 13.0–17.7)
Immature Grans (Abs): 0 10*3/uL (ref 0.0–0.1)
Immature Granulocytes: 0 %
Lymphocytes Absolute: 1.6 10*3/uL (ref 0.7–3.1)
Lymphs: 14 %
MCH: 23.8 pg — ABNORMAL LOW (ref 26.6–33.0)
MCHC: 29.9 g/dL — ABNORMAL LOW (ref 31.5–35.7)
MCV: 80 fL (ref 79–97)
Monocytes Absolute: 0.6 10*3/uL (ref 0.1–0.9)
Monocytes: 6 %
Neutrophils Absolute: 8.6 10*3/uL — ABNORMAL HIGH (ref 1.4–7.0)
Neutrophils: 76 %
Platelets: 301 10*3/uL (ref 150–450)
RBC: 4.54 x10E6/uL (ref 4.14–5.80)
RDW: 16.3 % — ABNORMAL HIGH (ref 11.6–15.4)
WBC: 11.4 10*3/uL — ABNORMAL HIGH (ref 3.4–10.8)

## 2019-04-22 LAB — COMPREHENSIVE METABOLIC PANEL
ALT: 21 IU/L (ref 0–44)
AST: 21 IU/L (ref 0–40)
Albumin/Globulin Ratio: 1.1 — ABNORMAL LOW (ref 1.2–2.2)
Albumin: 4.1 g/dL (ref 4.0–5.0)
Alkaline Phosphatase: 151 IU/L — ABNORMAL HIGH (ref 39–117)
BUN/Creatinine Ratio: 15 (ref 9–20)
BUN: 21 mg/dL (ref 6–24)
Bilirubin Total: 0.6 mg/dL (ref 0.0–1.2)
CO2: 29 mmol/L (ref 20–29)
Calcium: 9.2 mg/dL (ref 8.7–10.2)
Chloride: 96 mmol/L (ref 96–106)
Creatinine, Ser: 1.44 mg/dL — ABNORMAL HIGH (ref 0.76–1.27)
GFR calc Af Amer: 65 mL/min/{1.73_m2} (ref >59)
GFR calc non Af Amer: 56 mL/min/{1.73_m2} — ABNORMAL LOW (ref >59)
Globulin, Total: 3.6 g/dL (ref 1.5–4.5)
Glucose: 179 mg/dL — ABNORMAL HIGH (ref 65–99)
Potassium: 4.4 mmol/L (ref 3.5–5.2)
Sodium: 142 mmol/L (ref 134–144)
Total Protein: 7.7 g/dL (ref 6.0–8.5)

## 2019-04-22 LAB — LIPID PANEL WITH LDL/HDL RATIO
Cholesterol, Total: 116 mg/dL (ref 100–199)
HDL: 37 mg/dL — ABNORMAL LOW (ref >39)
LDL Calculated: 63 mg/dL (ref 0–99)
LDl/HDL Ratio: 1.7 ratio (ref 0.0–3.6)
Triglycerides: 81 mg/dL (ref 0–149)
VLDL Cholesterol Cal: 16 mg/dL (ref 5–40)

## 2019-04-22 LAB — B12 AND FOLATE PANEL
Folate: 4.5 ng/mL (ref >3.0)
Vitamin B-12: 1566 pg/mL — ABNORMAL HIGH (ref 232–1245)

## 2019-04-22 LAB — MICROALBUMIN / CREATININE URINE RATIO
Creatinine, Urine: 216.1 mg/dL
Microalb/Creat Ratio: 63 mg/g creat — ABNORMAL HIGH (ref 0–29)
Microalbumin, Urine: 136.8 ug/mL

## 2019-04-22 LAB — HIV ANTIBODY (ROUTINE TESTING W REFLEX): HIV Screen 4th Generation wRfx: NONREACTIVE

## 2019-04-22 LAB — MAGNESIUM: Magnesium: 1.1 mg/dL — ABNORMAL LOW (ref 1.6–2.3)

## 2019-04-22 LAB — URIC ACID: Uric Acid: 8.3 mg/dL (ref 3.7–8.6)

## 2019-04-22 LAB — RPR QUALITATIVE: RPR Ser Ql: NONREACTIVE

## 2019-04-22 LAB — PSA: Prostate Specific Ag, Serum: 0.7 ng/mL (ref 0.0–4.0)

## 2019-04-27 ENCOUNTER — Encounter: Payer: Self-pay | Admitting: Podiatry

## 2019-04-27 ENCOUNTER — Other Ambulatory Visit: Payer: Self-pay

## 2019-04-27 ENCOUNTER — Ambulatory Visit (INDEPENDENT_AMBULATORY_CARE_PROVIDER_SITE_OTHER): Payer: Medicare Other | Admitting: Podiatry

## 2019-04-27 ENCOUNTER — Encounter: Payer: Self-pay | Admitting: Internal Medicine

## 2019-04-27 VITALS — Temp 97.3°F

## 2019-04-27 DIAGNOSIS — L409 Psoriasis, unspecified: Secondary | ICD-10-CM

## 2019-04-27 DIAGNOSIS — B351 Tinea unguium: Secondary | ICD-10-CM | POA: Diagnosis not present

## 2019-04-27 DIAGNOSIS — I872 Venous insufficiency (chronic) (peripheral): Secondary | ICD-10-CM

## 2019-04-27 DIAGNOSIS — M79676 Pain in unspecified toe(s): Secondary | ICD-10-CM

## 2019-04-27 DIAGNOSIS — E1142 Type 2 diabetes mellitus with diabetic polyneuropathy: Secondary | ICD-10-CM

## 2019-04-27 NOTE — Progress Notes (Signed)
Complaint:  Visit Type: Patient returns to my office for continued preventative foot care services. Complaint: Patient states" my nails have grown long and thick and become painful to walk and wear shoes" Patient has been diagnosed with DM with no foot complications. The patient presents for preventative foot care services. No changes to ROS  Podiatric Exam: Vascular: dorsalis pedis and posterior tibial pulses are weakly palpable bilateral due to swelling. Capillary return is immediate. Temperature gradient is WNL. Skin turgor WNL Venous stasis  B/L. Sensorium: Normal Semmes Weinstein monofilament test. Normal tactile sensation bilaterally. Nail Exam: Pt has thick disfigured discolored nails with subungual debris noted bilateral entire nail hallux through fifth toenails Ulcer Exam: There is no evidence of ulcer or pre-ulcerative changes or infection. Orthopedic Exam: Muscle tone and strength are WNL. No limitations in general ROM. No crepitus or effusions noted. Foot type and digits show no abnormalities. Bony prominences are unremarkable. Skin: No Porokeratosis. No infection or ulcers.  Plantar punctate psoriasis.    Diagnosis:  Onychomycosis, , Pain in right toe, pain in left toes  Treatment & Plan Procedures and Treatment: Consent by patient was obtained for treatment procedures.   Debridement of mycotic and hypertrophic toenails, 1 through 5 bilateral and clearing of subungual debris. No ulceration, no infection noted.  Return Visit-Office Procedure: Patient instructed to return to the office for a follow up visit 3 months for continued evaluation and treatment.    Gardiner Barefoot DPM

## 2019-04-28 ENCOUNTER — Encounter: Payer: Self-pay | Admitting: Internal Medicine

## 2019-04-30 ENCOUNTER — Ambulatory Visit: Payer: Medicaid Other

## 2019-04-30 ENCOUNTER — Other Ambulatory Visit: Payer: Self-pay | Admitting: Internal Medicine

## 2019-04-30 ENCOUNTER — Telehealth: Payer: Self-pay

## 2019-04-30 DIAGNOSIS — M1A09X0 Idiopathic chronic gout, multiple sites, without tophus (tophi): Secondary | ICD-10-CM

## 2019-04-30 DIAGNOSIS — L400 Psoriasis vulgaris: Secondary | ICD-10-CM | POA: Diagnosis not present

## 2019-04-30 DIAGNOSIS — E7800 Pure hypercholesterolemia, unspecified: Secondary | ICD-10-CM

## 2019-04-30 DIAGNOSIS — E119 Type 2 diabetes mellitus without complications: Secondary | ICD-10-CM | POA: Diagnosis not present

## 2019-04-30 DIAGNOSIS — I429 Cardiomyopathy, unspecified: Secondary | ICD-10-CM

## 2019-04-30 DIAGNOSIS — I270 Primary pulmonary hypertension: Secondary | ICD-10-CM

## 2019-04-30 MED ORDER — ALLOPURINOL 300 MG PO TABS: 300.0000 mg | ORAL_TABLET | Freq: Every day | ORAL | 4 refills | Status: DC

## 2019-04-30 MED ORDER — OMEPRAZOLE 20 MG PO CPDR: 20.0000 mg | DELAYED_RELEASE_CAPSULE | Freq: Two times a day (BID) | ORAL | 2 refills | Status: DC

## 2019-04-30 MED ORDER — GABAPENTIN 600 MG PO TABS: 600.0000 mg | ORAL_TABLET | Freq: Three times a day (TID) | ORAL | 1 refills | Status: DC

## 2019-04-30 MED ORDER — TRIAMCINOLONE ACETONIDE 0.5 % EX OINT: 1.0000 "application " | TOPICAL_OINTMENT | Freq: Two times a day (BID) | CUTANEOUS | 3 refills | Status: DC

## 2019-04-30 MED ORDER — ISOSORBIDE MONONITRATE ER 60 MG PO TB24: 60.0000 mg | ORAL_TABLET | Freq: Every day | ORAL | 3 refills | Status: DC

## 2019-04-30 MED ORDER — CARVEDILOL 25 MG PO TABS: 25.0000 mg | ORAL_TABLET | Freq: Two times a day (BID) | ORAL | 3 refills | Status: DC

## 2019-04-30 MED ORDER — FUROSEMIDE 40 MG PO TABS: 60.0000 mg | ORAL_TABLET | Freq: Two times a day (BID) | ORAL | 3 refills | Status: DC

## 2019-04-30 MED ORDER — ASPIRIN EC 81 MG PO TBEC: 81.0000 mg | DELAYED_RELEASE_TABLET | Freq: Every day | ORAL | 1 refills | Status: DC

## 2019-04-30 MED ORDER — ATORVASTATIN CALCIUM 40 MG PO TABS: 40.0000 mg | ORAL_TABLET | Freq: Every day | ORAL | 3 refills | Status: DC

## 2019-04-30 MED ORDER — MAGNESIUM OXIDE 400 MG PO TABS: 400.0000 mg | ORAL_TABLET | Freq: Every day | ORAL | 3 refills | Status: DC

## 2019-04-30 MED ORDER — UMECLIDINIUM-VILANTEROL 62.5-25 MCG/INH IN AEPB: 1.0000 | INHALATION_SPRAY | Freq: Every day | RESPIRATORY_TRACT | 5 refills | Status: DC

## 2019-04-30 NOTE — Telephone Encounter (Signed)
Pt called need a refill on all his med as per dr Humphrey Rolls send all medication and also advised to continue magnesium and also made follow up on 5/26 to se dfk

## 2019-05-05 ENCOUNTER — Other Ambulatory Visit: Payer: Self-pay

## 2019-05-05 ENCOUNTER — Telehealth: Payer: Self-pay

## 2019-05-05 MED ORDER — TRIAMCINOLONE ACETONIDE 0.5 % EX CREA: 1.0000 "application " | TOPICAL_CREAM | Freq: Two times a day (BID) | CUTANEOUS | 1 refills | Status: DC

## 2019-05-05 NOTE — Telephone Encounter (Signed)
Faxed orders for RX for oxygen and put copy in scan and put in [facility] box. Beth

## 2019-05-06 ENCOUNTER — Telehealth: Payer: Self-pay

## 2019-05-06 NOTE — Telephone Encounter (Signed)
Spoke with pt, he was concerned we sent in triamcinolone ointment 0.5% instead of cream to pharmacy, called pharmacy and switched ointment to cream. Spoke with pharmacist he stated that the ointment had already been dispensed to pt and he wasn't 100 percent sure if the insurance would cover the cream since its so close to the other medication date he picked it up, I informed him that we would like to take the ointment off fine and add the cream so that the next time to fill it it will be for the cream.

## 2019-05-12 ENCOUNTER — Other Ambulatory Visit: Payer: Self-pay

## 2019-05-12 ENCOUNTER — Encounter: Payer: Self-pay | Admitting: Internal Medicine

## 2019-05-12 ENCOUNTER — Ambulatory Visit (INDEPENDENT_AMBULATORY_CARE_PROVIDER_SITE_OTHER): Payer: Medicare Other | Admitting: Internal Medicine

## 2019-05-12 DIAGNOSIS — I5032 Chronic diastolic (congestive) heart failure: Secondary | ICD-10-CM

## 2019-05-12 DIAGNOSIS — G4733 Obstructive sleep apnea (adult) (pediatric): Secondary | ICD-10-CM | POA: Diagnosis not present

## 2019-05-12 DIAGNOSIS — E1165 Type 2 diabetes mellitus with hyperglycemia: Secondary | ICD-10-CM

## 2019-05-12 DIAGNOSIS — I10 Essential (primary) hypertension: Secondary | ICD-10-CM | POA: Diagnosis not present

## 2019-05-12 MED ORDER — TRAZODONE HCL 50 MG PO TABS: 25.0000 mg | ORAL_TABLET | Freq: Every evening | ORAL | 3 refills | Status: DC | PRN

## 2019-05-12 MED ORDER — TRIAMCINOLONE ACETONIDE 0.5 % EX CREA: 1.0000 "application " | TOPICAL_CREAM | Freq: Two times a day (BID) | CUTANEOUS | 1 refills | Status: DC

## 2019-05-12 MED ORDER — HYDRALAZINE HCL 25 MG PO TABS: ORAL_TABLET | ORAL | 3 refills | Status: DC

## 2019-05-12 NOTE — Progress Notes (Signed)
[facility] [place], [place] [address]  Internal MEDICINE  Office Visit Note  Patient Name: [name]  [id number]  [id number]  Date of Service: 05/14/2019  Chief Complaint  Patient presents with  . Diabetes  . Hypertension  . Headache    left side/only lasts a few minutes/  . Shoulder Pain    random and sudden onset/sat first on right side and recently on left side/no radiating pain down arm.  . Back Pain    spasms    HPI Pt is here in the office for routine follow up, some med changes were made on previous visit 1. Dm is improved, pt is on trulicity and Tresiba,  2. BP is better as well, but not optimum control 3. Pending O2 light weight 4. CPAP machine needs adjustments as he is having problem maintaining his sleep  5. Will need follow up on echo  6. Labs showed very low mg level, improved renal functions 7. Continues to be on 3 L of O2 with ambulation, he has not seen a cardiologist or pulmonolgist recently. He is not sure why he is on O2   Current Medication: Outpatient Encounter Medications as of 05/12/2019  Medication Sig  . Accu-Chek FastClix Lancets MISC USE TO CHECK BLOOD SUGAR TWICE DAILY DX E11.65  . albuterol (PROVENTIL HFA;VENTOLIN HFA) 108 (90 Base) MCG/ACT inhaler Inhale 2 puffs into the lungs every 4 (four) hours as needed for wheezing or shortness of breath.  . allopurinol (ZYLOPRIM) 300 MG tablet Take 1 tablet (300 mg total) by mouth daily.  [name] Kitchen amLODipine (NORVASC) 5 MG tablet Take by mouth.  [name] Kitchen aspirin EC 81 MG tablet Take 1 tablet (81 mg total) by mouth daily.  [name] Kitchen atorvastatin (LIPITOR) 40 MG tablet Take 1 tablet (40 mg total) by mouth at bedtime.  . carvedilol (COREG) 25 MG tablet Take 1 tablet (25 mg total) by mouth every 12 (twelve) hours.  . Cholecalciferol (D3-1000) 25 MCG (1000 UT) tablet Take 2 tablets (2,000 Units total) by mouth daily.  . cyclobenzaprine (FLEXERIL) 5 MG tablet TAKE ONE TABLET BY MOUTH AT BEDTIME AS  NEEDED FOR MUSCLE SPASMS  . Dulaglutide (TRULICITY) 1.5 IN/8.6VE SOPN Inject into the skin.  . furosemide (LASIX) 40 MG tablet Take 1.5 tablets (60 mg total) by mouth 2 (two) times daily.  [name] Kitchen gabapentin (NEURONTIN) 600 MG tablet Take 1 tablet (600 mg total) by mouth 3 (three) times daily.  [name] Kitchen glucose blood (ACCU-CHEK GUIDE) test strip Use as instructed TWICE DAILY DX E11.65  . hydrALAZINE (APRESOLINE) 25 MG tablet Take one tab po bid for blood pressure  . isosorbide mononitrate (IMDUR) 60 MG 24 hr tablet Take 1 tablet (60 mg total) by mouth daily.  . magnesium oxide (MAG-OX) 400 MG tablet Take 1 tablet (400 mg total) by mouth daily.  . metolazone (ZAROXOLYN) 2.5 MG tablet Take 1 tablet (2.5 mg total) by mouth as needed. (Patient taking differently: Take 2.5 mg by mouth daily as needed (bloating or swelling). )  . omeprazole (PRILOSEC) 20 MG capsule Take 1 capsule (20 mg total) by mouth 2 (two) times daily before a meal.  . OXYGEN Inhale 3 L into the lungs.  . triamcinolone cream (KENALOG) 0.5 % Apply 1 application topically 2 (two) times daily.  [name] Kitchen triamcinolone ointment (KENALOG) 0.5 % Apply 1 application topically 2 (two) times daily.  [name] Kitchen umeclidinium-vilanterol (ANORO ELLIPTA) 62.5-25 MCG/INH AEPB Inhale 1 puff into the lungs daily.  . [DISCONTINUED] hydrALAZINE (APRESOLINE) 10  MG tablet Take one tab po bid for blood pressure  . traZODone (DESYREL) 50 MG tablet Take 0.5-1 tablets (25-50 mg total) by mouth at bedtime as needed for sleep.  . [DISCONTINUED] triamcinolone cream (KENALOG) 0.5 % Apply 1 application topically 2 (two) times daily.   No facility-administered encounter medications on file as of 05/12/2019.     Surgical History: Past Surgical History:  Procedure Laterality Date  . CORONARY ANGIOPLASTY WITH STENT PLACEMENT    . NO PAST SURGERIES      Medical History: Past Medical History:  Diagnosis Date  . Bell's palsy   . CHF (congestive heart failure) ([place])   . Chronic kidney  disease   . Diabetes mellitus without complication ([place])   . Hypertension   . NSVT (nonsustained ventricular tachycardia) ([place])   . Obstructive sleep apnea   . Psoriasis   . Pulmonary HTN ([place])     Family History: Family History  Problem Relation Age of Onset  . Heart failure Mother   . Kidney failure Brother     Social History   Socioeconomic History  . Marital status: Single    Spouse name: Not on file  . Number of children: Not on file  . Years of education: Not on file  . Highest education level: Not on file  Occupational History  . Not on file  Social Needs  . Financial resource strain: Somewhat hard  . Food insecurity:    Worry: Often true    Inability: Often true  . Transportation needs:    Medical: Yes    Non-medical: Yes  Tobacco Use  . Smoking status: Never Smoker  . Smokeless tobacco: Never Used  Substance and Sexual Activity  . Alcohol use: Yes    Alcohol/week: 2.0 standard drinks    Types: 1 Shots of liquor, 1 Cans of beer per week    Comment: very rarely 1-2 times per month  . Drug use: No  . Sexual activity: Not on file  Lifestyle  . Physical activity:    Days per week: 0 days    Minutes per session: 0 min  . Stress: Rather much  Relationships  . Social connections:    Talks on phone: Not on file    Gets together: Not on file    Attends religious service: Not on file    Active member of club or organization: Not on file    Attends meetings of clubs or organizations: Not on file    Relationship status: Not on file  . Intimate partner violence:    Fear of current or ex partner: Not on file    Emotionally abused: Not on file    Physically abused: Not on file    Forced sexual activity: Not on file  Other Topics Concern  . Not on file  Social History Narrative  . Not on file    Review of Systems  Constitutional: Negative for chills, fatigue and unexpected weight change.  HENT: Positive for postnasal drip. Negative for congestion,  rhinorrhea, sneezing and sore throat.   Eyes: Negative for redness.  Respiratory: Negative for cough, chest tightness and shortness of breath.   Cardiovascular: Negative for chest pain and palpitations.  Gastrointestinal: Negative for abdominal pain, constipation, diarrhea, nausea and vomiting.  Genitourinary: Negative for dysuria and frequency.  Musculoskeletal: Negative for arthralgias, back pain, joint swelling and neck pain.  Skin: Negative for rash.  Neurological: Negative.  Negative for tremors and numbness.  Hematological: Negative for adenopathy. Does not  bruise/bleed easily.  Psychiatric/Behavioral: Negative for behavioral problems (Depression), sleep disturbance and suicidal ideas. The patient is not nervous/anxious.     Vital Signs: BP (!) 156/110   Pulse 85   Resp 16   Ht 5\' 3"  (1.6 m)   Wt 272 lb (123.4 kg)   SpO2 99%   BMI 48.18 kg/m    Physical Exam Constitutional:      General: He is not in acute distress.    Appearance: He is well-developed. He is not diaphoretic.  HENT:     Head: Normocephalic and atraumatic.     Mouth/Throat:     Pharynx: No oropharyngeal exudate.  Eyes:     Pupils: Pupils are equal, round, and reactive to light.  Neck:     Musculoskeletal: Normal range of motion and neck supple.     Thyroid: No thyromegaly.     Vascular: No JVD.     Trachea: No tracheal deviation.  Cardiovascular:     Rate and Rhythm: Normal rate and regular rhythm.     Heart sounds: Normal heart sounds. No murmur. No friction rub. No gallop.   Pulmonary:     Effort: Pulmonary effort is normal. No respiratory distress.     Breath sounds: No wheezing or rales.  Chest:     Chest wall: No tenderness.  Abdominal:     General: Bowel sounds are normal.     Palpations: Abdomen is soft.  Musculoskeletal: Normal range of motion.  Lymphadenopathy:     Cervical: No cervical adenopathy.  Skin:    General: Skin is warm and dry.  Neurological:     Mental Status: He is  alert and oriented to person, place, and time.     Cranial Nerves: No cranial nerve deficit.  Psychiatric:        Behavior: Behavior normal.        Thought Content: Thought content normal.        Judgment: Judgment normal.    Assessment/Plan: 1. Chronic diastolic congestive heart failure (HCC) - Continue all other meds, might need to decrease Lasix to 40mg  to 60 mg on alternate days increase hydrALAZINE (APRESOLINE) 25 MG tablet; Take one tab po bid for blood pressure  Dispense: 180 tablet; Refill: 3 - [facility] SLEEP STUDY; Future - ECHOCARDIOGRAM COMPLETE; Future  2. Obstructive sleep apnea - Add Trazodone  - traZODone (DESYREL) 50 MG tablet; Take 0.5-1 tablets (25-50 mg total) by mouth at bedtime as needed for sleep.  Dispense: 30 tablet; Refill: 3 - [facility] SLEEP STUDY; Future - Ambulatory referral to Pulmonology  3. Uncontrolled type 2 diabetes mellitus with hyperglycemia (HCC) - Continue Trulicity 1.5 mg q week, pt continues to lose weight  - Increase  insulin degludec (TRESIBA FLEXTOUCH) 100 UNIT/ML SOPN FlexTouch Pen; Inject 12- 16 units at bed time  Dispense: 3 pen; Refill: 6  4. Benign hypertension - Increase Hydralazine 25 mg po bid, continue all other meds   General Counseling: Pete verbalizes understanding of the findings of todays visit and agrees with plan of treatment. I have discussed any further diagnostic evaluation that may be needed or ordered today. We also reviewed his medications today. he has been encouraged to call the office with any questions or concerns that should arise related to todays visit.  Pt needs to have a light weight o2 concentrator   Orders Placed This Encounter  Procedures  . Ambulatory referral to Pulmonology  . ECHOCARDIOGRAM COMPLETE  . [facility] SLEEP STUDY    Meds ordered this  encounter  Medications  . hydrALAZINE (APRESOLINE) 25 MG tablet    Sig: Take one tab po bid for blood pressure    Dispense:  180 tablet    Refill:  3  . traZODone  (DESYREL) 50 MG tablet    Sig: Take 0.5-1 tablets (25-50 mg total) by mouth at bedtime as needed for sleep.    Dispense:  30 tablet    Refill:  3    Time spent:25 Minutes   Dr Lavera Guise Internal medicine

## 2019-05-13 ENCOUNTER — Ambulatory Visit (INDEPENDENT_AMBULATORY_CARE_PROVIDER_SITE_OTHER): Payer: Medicare Other | Admitting: Internal Medicine

## 2019-05-13 DIAGNOSIS — J4540 Moderate persistent asthma, uncomplicated: Secondary | ICD-10-CM | POA: Diagnosis not present

## 2019-05-13 LAB — PULMONARY FUNCTION TEST

## 2019-05-14 ENCOUNTER — Other Ambulatory Visit: Payer: Self-pay

## 2019-05-14 DIAGNOSIS — L400 Psoriasis vulgaris: Secondary | ICD-10-CM | POA: Diagnosis not present

## 2019-05-14 DIAGNOSIS — Z79899 Other long term (current) drug therapy: Secondary | ICD-10-CM | POA: Diagnosis not present

## 2019-05-14 MED ORDER — TRIAMCINOLONE 0.1 % CREAM:EUCERIN CREAM 1:1: 1.0000 "application " | TOPICAL_CREAM | Freq: Two times a day (BID) | CUTANEOUS | 2 refills | Status: DC

## 2019-05-14 MED ORDER — INSULIN DEGLUDEC 100 UNIT/ML ~~LOC~~ SOPN: PEN_INJECTOR | SUBCUTANEOUS | 6 refills | Status: DC

## 2019-05-17 NOTE — Procedures (Signed)
[facility] [place], [address]  DATE OF SERVICE: May 13, 2019  Complete Pulmonary Function Testing Interpretation:  FINDINGS:  The forced vital capacity is moderately decreased.  The FEV1 is 1.68 L which is 64% of predicted and is moderately decreased.  FEV1 FVC ratio is normal.  Postbronchodilator there is no significant change in the FEV1 however clinical improvement may still occur in the absence of spirometric improvement.  Total lung capacity is severely decreased.  Residual volume is decreased residual volume total opacity ratio is decreased total gas volume is decreased.  DLCO is severely decreased.  The study is severely limited as the patient had a irregular sawtooth pattern to the expiratory limb of the flow volume loop which could signify cough or effort.  IMPRESSION:  Pulmonary function study would be suggestive of moderate to severe restrictive lung disease.  As noted above the study is somewhat limited by patient's ability to follow directions and also possibility of cough versus effort.  Allyne Gee, MD [facility] Pulmonary Critical Care Medicine Sleep Medicine

## 2019-05-18 ENCOUNTER — Ambulatory Visit: Payer: Medicare Other | Admitting: Internal Medicine

## 2019-05-18 ENCOUNTER — Other Ambulatory Visit: Payer: Self-pay

## 2019-05-18 MED ORDER — DULAGLUTIDE 1.5 MG/0.5ML ~~LOC~~ SOAJ: SUBCUTANEOUS | 1 refills | Status: DC

## 2019-05-19 ENCOUNTER — Ambulatory Visit (INDEPENDENT_AMBULATORY_CARE_PROVIDER_SITE_OTHER): Payer: Medicare Other | Admitting: Internal Medicine

## 2019-05-19 DIAGNOSIS — G4733 Obstructive sleep apnea (adult) (pediatric): Secondary | ICD-10-CM

## 2019-05-22 ENCOUNTER — Other Ambulatory Visit: Payer: Self-pay

## 2019-05-22 ENCOUNTER — Ambulatory Visit: Payer: Medicare Other

## 2019-05-22 DIAGNOSIS — I5033 Acute on chronic diastolic (congestive) heart failure: Secondary | ICD-10-CM

## 2019-05-22 DIAGNOSIS — I5032 Chronic diastolic (congestive) heart failure: Secondary | ICD-10-CM

## 2019-05-28 ENCOUNTER — Other Ambulatory Visit: Payer: Self-pay

## 2019-05-28 ENCOUNTER — Ambulatory Visit (INDEPENDENT_AMBULATORY_CARE_PROVIDER_SITE_OTHER): Payer: Medicare Other | Admitting: Internal Medicine

## 2019-05-28 ENCOUNTER — Encounter: Payer: Self-pay | Admitting: Internal Medicine

## 2019-05-28 VITALS — BP 136/86 | HR 81 | Resp 16 | Ht 63.0 in | Wt 270.0 lb

## 2019-05-28 DIAGNOSIS — J449 Chronic obstructive pulmonary disease, unspecified: Secondary | ICD-10-CM | POA: Diagnosis not present

## 2019-05-28 DIAGNOSIS — G4733 Obstructive sleep apnea (adult) (pediatric): Secondary | ICD-10-CM | POA: Diagnosis not present

## 2019-05-28 DIAGNOSIS — Z9981 Dependence on supplemental oxygen: Secondary | ICD-10-CM

## 2019-05-28 MED ORDER — TRIAMCINOLONE ACETONIDE 0.5 % EX OINT: 1.0000 "application " | TOPICAL_OINTMENT | Freq: Two times a day (BID) | CUTANEOUS | 3 refills | Status: DC

## 2019-05-28 NOTE — Patient Instructions (Signed)
CPAP and BPAP Information  CPAP and BPAP are methods of helping a person breathe with the use of air pressure. CPAP stands for "continuous positive airway pressure." BPAP stands for "bi-level positive airway pressure." In both methods, air is blown through your nose or mouth and into your air passages to help you breathe well.  CPAP and BPAP use different amounts of pressure to blow air. With CPAP, the amount of pressure stays the same while you breathe in and out. With BPAP, the amount of pressure is increased when you breathe in (inhale) so that you can take larger breaths. Your health care provider will recommend whether CPAP or BPAP would be more helpful for you.  Why are CPAP and BPAP treatments used?  CPAP or BPAP can be helpful if you have:  · Sleep apnea.  · Chronic obstructive pulmonary disease (COPD).  · Heart failure.  · Medical conditions that weaken the muscles of the chest including muscular dystrophy, or neurological diseases such as amyotrophic lateral sclerosis (ALS).  · Other problems that cause breathing to be weak, abnormal, or difficult.  CPAP is most commonly used for obstructive sleep apnea (OSA) to keep the airways from collapsing when the muscles relax during sleep.  How is CPAP or BPAP administered?  Both CPAP and BPAP are provided by a small machine with a flexible plastic tube that attaches to a plastic mask. You wear the mask. Air is blown through the mask into your nose or mouth. The amount of pressure that is used to blow the air can be adjusted on the machine. Your health care provider will determine the pressure setting that should be used based on your individual needs.  When should CPAP or BPAP be used?  In most cases, the mask only needs to be worn during sleep. Generally, the mask needs to be worn throughout the night and during any daytime naps. People with certain medical conditions may also need to wear the mask at other times when they are awake. Follow instructions from your  health care provider about when to use the machine.  What are some tips for using the mask?    · Because the mask needs to be snug, some people feel trapped or closed-in (claustrophobic) when first using the mask. If you feel this way, you may need to get used to the mask. One way to do this is by holding the mask loosely over your nose or mouth and then gradually applying the mask more snugly. You can also gradually increase the amount of time that you use the mask.  · Masks are available in various types and sizes. Some fit over your mouth and nose while others fit over just your nose. If your mask does not fit well, talk with your health care provider about getting a different one.  · If you are using a mask that fits over your nose and you tend to breathe through your mouth, a chin strap may be applied to help keep your mouth closed.  · The CPAP and BPAP machines have alarms that may sound if the mask comes off or develops a leak.  · If you have trouble with the mask, it is very important that you talk with your health care provider about finding a way to make the mask easier to tolerate. Do not stop using the mask. Stopping the use of the mask could have a negative impact on your health.  What are some tips for using the machine?  ·   Place your CPAP or BPAP machine on a secure table or stand near an electrical outlet.  · Know where the on/off switch is located on the machine.  · Follow instructions from your health care provider about how to set the pressure on your machine and when you should use it.  · Do not eat or drink while the CPAP or BPAP machine is on. Food or fluids could get pushed into your lungs by the pressure of the CPAP or BPAP.  · Do not smoke. Tobacco smoke residue can damage the machine.  · For home use, CPAP and BPAP machines can be rented or purchased through home health care companies. Many different brands of machines are available. Renting a machine before purchasing may help you find out  which particular machine works well for you.  · Keep the CPAP or BPAP machine and attachments clean. Ask your health care provider for specific instructions.  Get help right away if:  · You have redness or open areas around your nose or mouth where the mask fits.  · You have trouble using the CPAP or BPAP machine.  · You cannot tolerate wearing the CPAP or BPAP mask.  · You have pain, discomfort, and bloating in your abdomen.  Summary  · CPAP and BPAP are methods of helping a person breathe with the use of air pressure.  · Both CPAP and BPAP are provided by a small machine with a flexible plastic tube that attaches to a plastic mask.  · If you have trouble with the mask, it is very important that you talk with your health care provider about finding a way to make the mask easier to tolerate.  This information is not intended to replace advice given to you by your health care provider. Make sure you discuss any questions you have with your health care provider.  Document Released: 08/31/2004 Document Revised: 08/05/2018 Document Reviewed: 10/22/2016  [employer]vier Interactive Patient Education © 2019 [employer]

## 2019-05-28 NOTE — Progress Notes (Signed)
[facility] [place], [place] [address]  Pulmonary Sleep Medicine   Office Visit Note  Patient Name: [name] DOB: December 04, 1966 MRN [id number]  Date of Service: 05/28/2019  Complaints/HPI: He is here for shortness of breath which is chronic. Patient is on oxygen for the last two years. Patient had a PFT done which reveals presence MODERATE obestructive lung disease. He has never smoked. He states he was exposed to second hand smoke. Patient states he does snore. He states he is on BiPAP at this time. He has been on CPAP for about a year now. He also did have a follow up PSG done which reveals MODERATE OSA with a low SaO2 of 63%. He feels tired and sleepy on his current settings. He had been tried on CPAP and was not able to tolerate so had been switched over to BIPAP. He does not know pressures at this time  ROS  General: (-) fever, (-) chills, (-) night sweats, (-) weakness Skin: (-) rashes, (-) itching,. Eyes: (-) visual changes, (-) redness, (-) itching. Nose and Sinuses: (-) nasal stuffiness or itchiness, (-) postnasal drip, (-) nosebleeds, (-) sinus trouble. Mouth and Throat: (-) sore throat, (-) hoarseness. Neck: (-) swollen glands, (-) enlarged thyroid, (-) neck pain. Respiratory: - cough, (-) bloody sputum, + shortness of breath, - wheezing. Cardiovascular: + ankle swelling, (-) chest pain. Lymphatic: (-) lymph node enlargement. Neurologic: (-) numbness, (-) tingling. Psychiatric: (-) anxiety, (-) depression   Current Medication: Outpatient Encounter Medications as of 05/28/2019  Medication Sig  . Accu-Chek FastClix Lancets MISC USE TO CHECK BLOOD SUGAR TWICE DAILY DX E11.65  . albuterol (PROVENTIL HFA;VENTOLIN HFA) 108 (90 Base) MCG/ACT inhaler Inhale 2 puffs into the lungs every 4 (four) hours as needed for wheezing or shortness of breath.  . allopurinol (ZYLOPRIM) 300 MG tablet Take 1 tablet (300 mg total) by mouth daily.  [name] Kitchen amLODipine  (NORVASC) 5 MG tablet Take by mouth.  [name] Kitchen aspirin EC 81 MG tablet Take 1 tablet (81 mg total) by mouth daily.  [name] Kitchen atorvastatin (LIPITOR) 40 MG tablet Take 1 tablet (40 mg total) by mouth at bedtime.  . carvedilol (COREG) 25 MG tablet Take 1 tablet (25 mg total) by mouth every 12 (twelve) hours.  . Cholecalciferol (D3-1000) 25 MCG (1000 UT) tablet Take 2 tablets (2,000 Units total) by mouth daily.  . cyclobenzaprine (FLEXERIL) 5 MG tablet TAKE ONE TABLET BY MOUTH AT BEDTIME AS NEEDED FOR MUSCLE SPASMS  . Dulaglutide (TRULICITY) 1.5 MG/8.6PY SOPN Use as directed once a week diag e11.65  . furosemide (LASIX) 40 MG tablet Take 1.5 tablets (60 mg total) by mouth 2 (two) times daily.  [name] Kitchen gabapentin (NEURONTIN) 600 MG tablet Take 1 tablet (600 mg total) by mouth 3 (three) times daily.  [name] Kitchen glucose blood (ACCU-CHEK GUIDE) test strip Use as instructed TWICE DAILY DX E11.65  . hydrALAZINE (APRESOLINE) 25 MG tablet Take one tab po bid for blood pressure  . insulin degludec (TRESIBA FLEXTOUCH) 100 UNIT/ML SOPN FlexTouch Pen Inject 12- 16 units at bed time  . isosorbide mononitrate (IMDUR) 60 MG 24 hr tablet Take 1 tablet (60 mg total) by mouth daily.  . magnesium oxide (MAG-OX) 400 MG tablet Take 1 tablet (400 mg total) by mouth daily.  . metolazone (ZAROXOLYN) 2.5 MG tablet Take 1 tablet (2.5 mg total) by mouth as needed. (Patient taking differently: Take 2.5 mg by mouth daily as needed (bloating or swelling). )  . omeprazole (PRILOSEC) 20  MG capsule Take 1 capsule (20 mg total) by mouth 2 (two) times daily before a meal.  . OXYGEN Inhale 3 L into the lungs.  . traZODone (DESYREL) 50 MG tablet Take 0.5-1 tablets (25-50 mg total) by mouth at bedtime as needed for sleep.  . Triamcinolone Acetonide (TRIAMCINOLONE 0.1 % CREAM : EUCERIN) CREA Apply 1 application topically 2 (two) times daily.  [name] Kitchen umeclidinium-vilanterol (ANORO ELLIPTA) 62.5-25 MCG/INH AEPB Inhale 1 puff into the lungs daily.   No facility-administered  encounter medications on file as of 05/28/2019.     Surgical History: Past Surgical History:  Procedure Laterality Date  . CORONARY ANGIOPLASTY WITH STENT PLACEMENT    . NO PAST SURGERIES      Medical History: Past Medical History:  Diagnosis Date  . Bell's palsy   . CHF (congestive heart failure) ([place])   . Chronic kidney disease   . Diabetes mellitus without complication ([place])   . Hypertension   . NSVT (nonsustained ventricular tachycardia) ([place])   . Obstructive sleep apnea   . Psoriasis   . Pulmonary HTN ([place])     Family History: Family History  Problem Relation Age of Onset  . Heart failure Mother   . Kidney failure Brother     Social History: Social History   Socioeconomic History  . Marital status: Single    Spouse name: Not on file  . Number of children: Not on file  . Years of education: Not on file  . Highest education level: Not on file  Occupational History  . Not on file  Social Needs  . Financial resource strain: Somewhat hard  . Food insecurity    Worry: Often true    Inability: Often true  . Transportation needs    Medical: Yes    Non-medical: Yes  Tobacco Use  . Smoking status: Never Smoker  . Smokeless tobacco: Never Used  Substance and Sexual Activity  . Alcohol use: Yes    Alcohol/week: 2.0 standard drinks    Types: 1 Shots of liquor, 1 Cans of beer per week    Comment: very rarely 1-2 times per month  . Drug use: No  . Sexual activity: Not on file  Lifestyle  . Physical activity    Days per week: 0 days    Minutes per session: 0 min  . Stress: Rather much  Relationships  . Social Herbalist on phone: Not on file    Gets together: Not on file    Attends religious service: Not on file    Active member of club or organization: Not on file    Attends meetings of clubs or organizations: Not on file    Relationship status: Not on file  . Intimate partner violence    Fear of current or ex partner: Not on file     Emotionally abused: Not on file    Physically abused: Not on file    Forced sexual activity: Not on file  Other Topics Concern  . Not on file  Social History Narrative  . Not on file    Vital Signs: Blood pressure 136/86, pulse 81, resp. rate 16, height 5\' 3"  (1.6 m), weight 270 lb (122.5 kg), SpO2 98 %.  Examination: General Appearance: The patient is well-developed, well-nourished, and in no distress. Skin: Gross inspection of skin unremarkable. Head: normocephalic, no gross deformities. Eyes: no gross deformities noted. ENT: ears appear grossly normal no exudates. Neck: Supple. No thyromegaly. No LAD. Respiratory: no rhonchi  are noted. Cardiovascular: Normal S1 and S2 without murmur or rub. Extremities: No cyanosis. pulses are equal. Neurologic: Alert and oriented. No involuntary movements.  LABS: Recent Results (from the past 2160 hour(s))  Urinalysis     Status: None   Collection Time: 03/05/19  7:51 PM  Result Value Ref Range   Specific Gravity, UA 1.018 1.005 - 1.030   pH, UA 5.0 5.0 - 7.5   Color, UA Yellow Yellow   Appearance Ur Clear Clear   Leukocytes, UA Negative Negative   Protein, UA Negative Negative/Trace   Glucose, UA Negative Negative   Ketones, UA Negative Negative   RBC, UA Negative Negative   Bilirubin, UA Negative Negative   Urobilinogen, Ur 1.0 0.2 - 1.0 mg/dL   Nitrite, UA Negative Negative  POCT HgB A1C     Status: Abnormal   Collection Time: 04/21/19 11:23 AM  Result Value Ref Range   Hemoglobin A1C 7.7 (A) 4.0 - 5.6 %   HbA1c POC (<> result, manual entry)     HbA1c, POC (prediabetic range)     HbA1c, POC (controlled diabetic range)    CBC with Differential/Platelet     Status: Abnormal   Collection Time: 04/21/19  1:34 PM  Result Value Ref Range   WBC 11.4 (H) 3.4 - 10.8 x10E3/uL   RBC 4.54 4.14 - 5.80 x10E6/uL   Hemoglobin 10.8 (L) 13.0 - 17.7 g/dL   Hematocrit 36.1 (L) 37.5 - 51.0 %   MCV 80 79 - 97 fL   MCH 23.8 (L) 26.6 - 33.0 pg    MCHC 29.9 (L) 31.5 - 35.7 g/dL   RDW 16.3 (H) 11.6 - 15.4 %   Platelets 301 150 - 450 x10E3/uL   Neutrophils 76 Not Estab. %   Lymphs 14 Not Estab. %   Monocytes 6 Not Estab. %   Eos 4 Not Estab. %   Basos 0 Not Estab. %   Neutrophils Absolute 8.6 (H) 1.4 - 7.0 x10E3/uL   Lymphocytes Absolute 1.6 0.7 - 3.1 x10E3/uL   Monocytes Absolute 0.6 0.1 - 0.9 x10E3/uL   EOS (ABSOLUTE) 0.5 (H) 0.0 - 0.4 x10E3/uL   Basophils Absolute 0.1 0.0 - 0.2 x10E3/uL   Immature Granulocytes 0 Not Estab. %   Immature Grans (Abs) 0.0 0.0 - 0.1 x10E3/uL  Comprehensive metabolic panel     Status: Abnormal   Collection Time: 04/21/19  1:34 PM  Result Value Ref Range   Glucose 179 (H) 65 - 99 mg/dL   BUN 21 6 - 24 mg/dL   Creatinine, Ser 1.44 (H) 0.76 - 1.27 mg/dL   GFR calc non Af Amer 56 (L) >59 mL/min/1.73   GFR calc Af Amer 65 >59 mL/min/1.73   BUN/Creatinine Ratio 15 9 - 20   Sodium 142 134 - 144 mmol/L   Potassium 4.4 3.5 - 5.2 mmol/L   Chloride 96 96 - 106 mmol/L   CO2 29 20 - 29 mmol/L   Calcium 9.2 8.7 - 10.2 mg/dL   Total Protein 7.7 6.0 - 8.5 g/dL   Albumin 4.1 4.0 - 5.0 g/dL   Globulin, Total 3.6 1.5 - 4.5 g/dL   Albumin/Globulin Ratio 1.1 (L) 1.2 - 2.2   Bilirubin Total 0.6 0.0 - 1.2 mg/dL   Alkaline Phosphatase 151 (H) 39 - 117 IU/L   AST 21 0 - 40 IU/L   ALT 21 0 - 44 IU/L  Lipid Panel With LDL/HDL Ratio     Status: Abnormal   Collection Time: 04/21/19  1:34 PM  Result Value Ref Range   Cholesterol, Total 116 100 - 199 mg/dL   Triglycerides 81 0 - 149 mg/dL   HDL 37 (L) >39 mg/dL   VLDL Cholesterol Cal 16 5 - 40 mg/dL   LDL Calculated 63 0 - 99 mg/dL   LDl/HDL Ratio 1.7 0.0 - 3.6 ratio    Comment:                                     LDL/HDL Ratio                                             Men  Women                               1/2 Avg.Risk  1.0    1.5                                   Avg.Risk  3.6    3.2                                2X Avg.Risk  6.2    5.0                                 3X Avg.Risk  8.0    6.1   Microalbumin / creatinine urine ratio     Status: Abnormal   Collection Time: 04/21/19  1:34 PM  Result Value Ref Range   Creatinine, Urine 216.1 Not Estab. mg/dL   Microalbumin, Urine 136.8 Not Estab. ug/mL   Microalb/Creat Ratio 63 (H) 0 - 29 mg/g creat    Comment:                        Normal:                0 -  29                        Moderately increased: 30 - 300                        Severely increased:       >300               **Please note reference interval change**   B12 and Folate Panel     Status: Abnormal   Collection Time: 04/21/19  1:34 PM  Result Value Ref Range   Vitamin B-12 1,566 (H) 232 - 1,245 pg/mL   Folate 4.5 >3.0 ng/mL    Comment: A serum folate concentration of less than 3.1 ng/mL is considered to represent clinical deficiency.   PSA     Status: None   Collection Time: 04/21/19  1:34 PM  Result Value Ref Range   Prostate Specific Ag, Serum 0.7 0.0 - 4.0 ng/mL    Comment: Roche ECLIA methodology. According to the American Urological Association, Serum PSA should decrease and remain at undetectable levels  after radical prostatectomy. The AUA defines biochemical recurrence as an initial PSA value 0.2 ng/mL or greater followed by a subsequent confirmatory PSA value 0.2 ng/mL or greater. Values obtained with different assay methods or kits cannot be used interchangeably. Results cannot be interpreted as absolute evidence of the presence or absence of malignant disease.   HIV Antibody (routine testing w rflx)     Status: None   Collection Time: 04/21/19  1:34 PM  Result Value Ref Range   HIV Screen 4th Generation wRfx Non Reactive Non Reactive  Uric acid     Status: None   Collection Time: 04/21/19  1:34 PM  Result Value Ref Range   Uric Acid 8.3 3.7 - 8.6 mg/dL    Comment:            Therapeutic target for gout patients: <6.0  RPR Qual     Status: None   Collection Time: 04/21/19  1:34 PM  Result Value  Ref Range   RPR Ser Ql Non Reactive Non Reactive  Magnesium     Status: Abnormal   Collection Time: 04/21/19  1:34 PM  Result Value Ref Range   Magnesium 1.1 (L) 1.6 - 2.3 mg/dL    Radiology: Dg Chest Portable 1 View  Result Date: 02/18/2019 CLINICAL DATA:  Respiratory distress.  History of CHF. EXAM: PORTABLE CHEST 1 VIEW COMPARISON:  Chest radiograph January 14, 2018 FINDINGS: Stable cardiomegaly. Similarly widened mediastinum attributable to AP technique. Pulmonary vascular congestion without pleural effusion or focal consolidation. Persistently elevated RIGHT hemidiaphragm. No pneumothorax. Soft tissue planes and included osseous structures are non suspicious. Large body habitus. IMPRESSION: 1. Stable cardiomegaly and pulmonary vascular congestion. Electronically Signed   By: Elon Alas M.D.   On: 02/18/2019 13:27    No results found.  No results found.    Assessment and Plan: Patient Active Problem List   Diagnosis Date Noted  . Type 2 diabetes mellitus with hyperglycemia, with long-term current use of insulin ([place]) 03/05/2019  . Hypoglycemia 02/18/2019  . Altered mental status 02/18/2019  . RLQ abdominal mass 01/20/2019  . CHF (congestive heart failure) ([place]) 10/07/2018  . Muscle cramps 10/07/2018  . Shortness of breath 09/08/2018  . Morbid obesity ([place]) 06/24/2018  . Acute-on-chronic kidney injury ([place]) 04/28/2018  . Acute on chronic respiratory failure with hypoxia and hypercapnia ([place]) 04/26/2018  . Morbid obesity with BMI of 50.0-59.9, adult ([place]) 04/23/2018  . Chronic diastolic heart failure ([place]) 02/10/2018  . Obstructive sleep apnea 02/10/2018  . Diabetes mellitus without complication ([place]) 00/86/7619  . CAD (coronary artery disease) 01/31/2017  . Hyperlipidemia, unspecified 01/31/2017  . Essential hypertension 01/17/2017  . Psoriasis 01/17/2017  . Chronic gout of multiple sites 12/21/2016  . DM type 2 with diabetic peripheral neuropathy ([place]) 06/25/2016   . Coronary artery disease involving native coronary artery 06/14/2015  . Cardiomyopathy ([place]) 08/19/2012  . CKD (chronic kidney disease) stage 3, GFR 30-59 ml/min ([facility]) 08/19/2012    1. OSA moderate severity. He still has symptoms. I would order a BIPAP titration to be done as he has not been able to tolerate CPAP. Also needs to work on weight loss which he is doing. Patient feels the need to increase the pressures. Patient states he is taking naps during the day. If pressures are adequate then would consider adding 2. Morbid obesity he is working on weight loss 3. COPD moderate. He will continue with inhalers as prescribed 4. Oxygen dpenedent needs to continue with oxygen therapy  General  Counseling: I have discussed the findings of the evaluation and examination with [name]  I have also discussed any further diagnostic evaluation thatmay be needed or ordered today. [name] verbalizes understanding of the findings of todays visit. We also reviewed his medications today and discussed drug interactions and side effects including but not limited excessive drowsiness and altered mental states. We also discussed that there is always a risk not just to him but also people around him. he has been encouraged to call the office with any questions or concerns that should arise related to todays visit.    Time spent: 41min  I have personally obtained a history, examined the patient, evaluated laboratory and imaging results, formulated the assessment and plan and placed orders.    Allyne Gee, MD [facility] Pulmonary and Critical Care Sleep medicine

## 2019-05-31 NOTE — Procedures (Signed)
[facility] [address] [place], [place] [address]  Patient Name: [name] DOB: 1966/11/19   SLEEP STUDY INTERPRETATION  DATE OF SERVICE: May 19, 2019   SLEEP STUDY HISTORY: This patient is referred to the sleep lab for a baseline Polysomnography. Pertinent history includes a history of diagnosis of excessive daytime somnolence and snoring.  PROCEDURE: This overnight polysomnogram was performed using the Alice 5 acquisition system using the standard diagnostic protocol as outlined by the AASM. This includes 6 channels of EEG, 2 channelscannels of EOG, chin EMG, bilateral anterior tibialis EMG, nasal/oral thermister, PTAF, chest and abdominal wall movements, ECG and pulse oximetry. Apneas and Hypopneas were scored per AASM definition.  SLEEP ARCHITECHTURE: This is a baseline polysomnograph  study. The total recording time was 385.5 minutes and the patients total sleep time is noted to be 322.0 minutes. Sleep onset latency was 14.2 minutes and is within normal limits.  Stage R sleep onset latency was 105 minutes. Sleep maintenance efficiency was 85.0 % and is normal.  Sleep staging expressed as a percentage of total sleep time demonstrated 9.8 % N1, 41.5 % N2 and 45.7 % N3  sleep. Stage R represents 3.1 % of total sleep time. This is decreased.  There were a total of 159 arousals  for an overall arousal index of 29.6 per hour of sleep. PLMS arousal are not noted. Arousals without respiratory events are  noted. This can contribute to sleep architechture disruption.  RESPIRATORY MONITORING:   Patient exhibits significant evidence of sleep disorderd breathing characterized by 0 central apneas, 0 obstructive apneas and 0 mixed apneas. There were 105 obstructive hypopneas and 5 RERAs. Most of the apneas/hypopneas were of stroke to variety. The total apnea hypopnea index (apneas and hypopneas per hour of sleep) is 19.6 respiratory events per hour and is  moderate.  Respiratory monitoring demonstrated severe snoring through the night. There are a total of 385 snoring episodes representing 40.6 % of sleep.   Baseline oxygen saturation during wakefulness was 85 % and during NREM sleep averaged 84 % through the night. Arterial saturation during REM sleep was 82 % through the night. There was significant  oxygen desaturation with the respiratory events. Arterial oxygen desaturation occurred of at least 4% was noted with a low saturation of 63 %. The study was performed off oxygen.  CARDIAC MONITORING:   Average heart rate is 63 during sleep with a high of 96 beats per minute. Malignant arrhythmias are not noted.    IMPRESSIONS:  --This overnight polysomnogram demonstrates presence of moderate obstructive sleep apnea with an overall AHI 19.6 per hour. --The overall AHI was significantly worse  during Stage R. --There were associated severe arterial oxygen desaturations noted with a lowest of 63% --There no evidence of significant PLMS noted in this study. --There is severe snoring noted throughout the study.    RECOMMENDATIONS:  --CPAP titration study is indicated in this case secondary to the significant obstructive sleep apnea oxygen desaturations.. --Nasal decongestants and antihistamines may be of help for increased upper airways resistance when present. --Weight loss through dietary and lifestyle modification is recommended in the presence of obesity. --A search for and treatment of any underlying cardiopulmonary disease is      recommended in the presence of oxygen desaturations. --Alternative treatment options if the patient is not willing to use CPAP include oral   appliances as well as surgical intervention which may help in the appropriate patient. --Clinical correlation is recommended. Please feel free  to call the office for any further  questions or assistance in the care of this patient.     Allyne Gee, MD [facility] Pulmonary  Critical Care Medicine Sleep medicine

## 2019-06-02 ENCOUNTER — Other Ambulatory Visit: Payer: Self-pay

## 2019-06-02 DIAGNOSIS — I270 Primary pulmonary hypertension: Secondary | ICD-10-CM

## 2019-06-02 MED ORDER — ASPIRIN EC 81 MG PO TBEC: 81.0000 mg | DELAYED_RELEASE_TABLET | Freq: Every day | ORAL | 1 refills | Status: DC

## 2019-06-09 ENCOUNTER — Ambulatory Visit (INDEPENDENT_AMBULATORY_CARE_PROVIDER_SITE_OTHER): Payer: Medicare Other | Admitting: Adult Health

## 2019-06-09 ENCOUNTER — Encounter: Payer: Self-pay | Admitting: Adult Health

## 2019-06-09 ENCOUNTER — Ambulatory Visit: Payer: Medicare Other | Admitting: Internal Medicine

## 2019-06-09 ENCOUNTER — Other Ambulatory Visit: Payer: Self-pay

## 2019-06-09 VITALS — BP 138/92 | HR 82 | Temp 98.4°F | Resp 16 | Ht 63.0 in | Wt 286.0 lb

## 2019-06-09 DIAGNOSIS — E1165 Type 2 diabetes mellitus with hyperglycemia: Secondary | ICD-10-CM | POA: Diagnosis not present

## 2019-06-09 DIAGNOSIS — Z9981 Dependence on supplemental oxygen: Secondary | ICD-10-CM

## 2019-06-09 DIAGNOSIS — I10 Essential (primary) hypertension: Secondary | ICD-10-CM | POA: Diagnosis not present

## 2019-06-09 DIAGNOSIS — R0789 Other chest pain: Secondary | ICD-10-CM

## 2019-06-09 DIAGNOSIS — Z794 Long term (current) use of insulin: Secondary | ICD-10-CM

## 2019-06-09 LAB — GLUCOSE, POCT (MANUAL RESULT ENTRY): POC Glucose: 596 mg/dl — AB (ref 70–99)

## 2019-06-09 NOTE — Progress Notes (Signed)
[facility] [address], [place] [address]  Internal MEDICINE  Office Visit Note  Patient Name: [name]  [id number]  [id number]  Date of Service: 06/09/2019  Chief Complaint  Patient presents with  . Medical Management of Chronic Issues  . Migraine    sensitive to light , headache for four days , sluggish , vomitting , fatigue sleeping often , waking up in mornings feeling loopy   . Diabetes    blood sugars have been managed    . Hypertension  . Quality Metric Gaps    colonoscopy      HPI Pt is here for a sick visit. Pt reports for about a week he has been very tired ans sluggish.  He reports his eyes feel very heavy and he is unable to keep his eyes open at times. He is sleeping more than normal.  He also reports episodes of vomiting intermittently.  He is complaining of headache without dizziness. His lower extremities are edematous. Pt is hyperglycemic in the office at 596 mg/dl.  He has a history of CHF, although denies feeling extra SOB.           Current Medication:  Outpatient Encounter Medications as of 06/09/2019  Medication Sig  . Accu-Chek FastClix Lancets MISC USE TO CHECK BLOOD SUGAR TWICE DAILY DX E11.65  . albuterol (PROVENTIL HFA;VENTOLIN HFA) 108 (90 Base) MCG/ACT inhaler Inhale 2 puffs into the lungs every 4 (four) hours as needed for wheezing or shortness of breath.  . allopurinol (ZYLOPRIM) 300 MG tablet Take 1 tablet (300 mg total) by mouth daily.  [name] Kitchen amLODipine (NORVASC) 5 MG tablet Take by mouth.  [name] Kitchen aspirin EC 81 MG tablet Take 1 tablet (81 mg total) by mouth daily.  [name] Kitchen atorvastatin (LIPITOR) 40 MG tablet Take 1 tablet (40 mg total) by mouth at bedtime.  . carvedilol (COREG) 25 MG tablet Take 1 tablet (25 mg total) by mouth every 12 (twelve) hours.  . Cholecalciferol (D3-1000) 25 MCG (1000 UT) tablet Take 2 tablets (2,000 Units total) by mouth daily.  . cyclobenzaprine (FLEXERIL) 5 MG tablet TAKE ONE TABLET BY MOUTH AT  BEDTIME AS NEEDED FOR MUSCLE SPASMS  . Dulaglutide (TRULICITY) 1.5 HD/6.2IW SOPN Use as directed once a week diag e11.65  . furosemide (LASIX) 40 MG tablet Take 1.5 tablets (60 mg total) by mouth 2 (two) times daily.  [name] Kitchen gabapentin (NEURONTIN) 600 MG tablet Take 1 tablet (600 mg total) by mouth 3 (three) times daily.  [name] Kitchen glucose blood (ACCU-CHEK GUIDE) test strip Use as instructed TWICE DAILY DX E11.65  . hydrALAZINE (APRESOLINE) 25 MG tablet Take one tab po bid for blood pressure  . insulin degludec (TRESIBA FLEXTOUCH) 100 UNIT/ML SOPN FlexTouch Pen Inject 12- 16 units at bed time  . isosorbide mononitrate (IMDUR) 60 MG 24 hr tablet Take 1 tablet (60 mg total) by mouth daily.  . magnesium oxide (MAG-OX) 400 MG tablet Take 1 tablet (400 mg total) by mouth daily.  . metolazone (ZAROXOLYN) 2.5 MG tablet Take 1 tablet (2.5 mg total) by mouth as needed. (Patient taking differently: Take 2.5 mg by mouth daily as needed (bloating or swelling). )  . omeprazole (PRILOSEC) 20 MG capsule Take 1 capsule (20 mg total) by mouth 2 (two) times daily before a meal.  . OXYGEN Inhale 3 L into the lungs.  . traZODone (DESYREL) 50 MG tablet Take 0.5-1 tablets (25-50 mg total) by mouth at bedtime as needed for sleep.  . Triamcinolone  Acetonide (TRIAMCINOLONE 0.1 % CREAM : EUCERIN) CREA Apply 1 application topically 2 (two) times daily.  [name] Kitchen triamcinolone ointment (KENALOG) 0.5 % Apply 1 application topically 2 (two) times daily.  [name] Kitchen umeclidinium-vilanterol (ANORO ELLIPTA) 62.5-25 MCG/INH AEPB Inhale 1 puff into the lungs daily.   No facility-administered encounter medications on file as of 06/09/2019.       Medical History: Past Medical History:  Diagnosis Date  . Bell's palsy   . CHF (congestive heart failure) ([place])   . Chronic kidney disease   . Diabetes mellitus without complication ([place])   . Hypertension   . NSVT (nonsustained ventricular tachycardia) ([place])   . Obstructive sleep apnea   . Psoriasis   .  Pulmonary HTN (HCC)      Vital Signs: BP (!) 138/92 (BP Location: Left Arm, Patient Position: Sitting, Cuff Size: Normal)   Pulse 82   Temp 98.4 F (36.9 C)   Resp 16   Ht 5\' 3"  (1.6 m)   Wt 286 lb (129.7 kg)   SpO2 99%   BMI 50.66 kg/m    Review of Systems  Constitutional: Positive for fatigue. Negative for chills and unexpected weight change.  HENT: Negative.  Negative for congestion, rhinorrhea, sneezing and sore throat.   Eyes: Negative for redness.  Respiratory: Negative.  Negative for cough, chest tightness and shortness of breath.   Cardiovascular: Negative.  Negative for chest pain and palpitations.  Gastrointestinal: Negative.  Negative for abdominal pain, constipation, diarrhea, nausea and vomiting.  Endocrine: Negative.   Genitourinary: Negative.  Negative for dysuria and frequency.  Musculoskeletal: Negative.  Negative for arthralgias, back pain, joint swelling and neck pain.  Skin: Negative.  Negative for rash.  Allergic/Immunologic: Negative.   Neurological: Positive for headaches. Negative for tremors and numbness.  Hematological: Negative for adenopathy. Does not bruise/bleed easily.  Psychiatric/Behavioral: Negative.  Negative for behavioral problems, sleep disturbance and suicidal ideas. The patient is not nervous/anxious.     Physical Exam Vitals signs and nursing note reviewed.  Constitutional:      General: He is not in acute distress.    Appearance: He is well-developed. He is not diaphoretic.  HENT:     Head: Normocephalic and atraumatic.     Mouth/Throat:     Pharynx: No oropharyngeal exudate.  Eyes:     Pupils: Pupils are equal, round, and reactive to light.  Neck:     Musculoskeletal: Normal range of motion and neck supple.     Thyroid: No thyromegaly.     Vascular: No JVD.     Trachea: No tracheal deviation.  Cardiovascular:     Rate and Rhythm: Normal rate and regular rhythm.     Heart sounds: Normal heart sounds. No murmur. No friction  rub. No gallop.   Pulmonary:     Breath sounds: Wheezing present. No rales.     Comments: Course breath sounds.  Chest:     Chest wall: No tenderness.  Abdominal:     Palpations: Abdomen is soft.     Tenderness: There is no abdominal tenderness. There is no guarding.  Musculoskeletal: Normal range of motion.     Right lower leg: Edema present.     Left lower leg: Edema present.  Lymphadenopathy:     Cervical: No cervical adenopathy.  Skin:    General: Skin is warm and dry.  Neurological:     Mental Status: He is alert and oriented to person, place, and time.     Cranial Nerves: No cranial  nerve deficit.  Psychiatric:        Behavior: Behavior normal.        Thought Content: Thought content normal.        Judgment: Judgment normal.     Assessment/Plan: 1. Sensation of chest tightness EKG normal in office. - EKG 12-Lead  2. Type 2 diabetes mellitus with hyperglycemia, with long-term current use of insulin (HCC) BS 596 in office.  Discussed plan for patient to go to ED to address possible CHF exacerbation and hyperglycemia.  Pt is refusing at this time, and would like to wait 24 hours. We discussed the dangers of him waiting, and he verbalized that he understood. Before leaving I once again encouraged the patient to go to the ED, and offered to call and ambulance.  HE continues to refuse at this time.    3. Essential hypertension Elevated diastolic pressure in office. Given other symptoms, have encouraged patient to go to ED   4. Oxygen dependent Pt currently on 2lpm via [place] continuously.   General Counseling: cristopher ciccarelli understanding of the findings of todays visit and agrees with plan of treatment. I have discussed any further diagnostic evaluation that may be needed or ordered today. We also reviewed his medications today. he has been encouraged to call the office with any questions or concerns that should arise related to todays visit.   Orders Placed This Encounter   Procedures  . EKG 12-Lead    No orders of the defined types were placed in this encounter.   Time spent: 25  Minutes  This patient was seen by Orson Gear AGNP-C in Collaboration with Dr Lavera Guise as a part of collaborative care agreement.  Kendell Bane AGNP-C Internal Medicine

## 2019-06-10 ENCOUNTER — Encounter: Payer: Medicare Other | Admitting: Internal Medicine

## 2019-06-15 ENCOUNTER — Inpatient Hospital Stay
Admission: EM | Admit: 2019-06-15 | Discharge: 2019-06-25 | DRG: 673 | Disposition: A | Discharge status: Home / Self Care | Payer: Medicare Other | Attending: Internal Medicine | Admitting: Internal Medicine

## 2019-06-15 ENCOUNTER — Inpatient Hospital Stay: Payer: Medicare Other

## 2019-06-15 ENCOUNTER — Other Ambulatory Visit: Payer: Self-pay

## 2019-06-15 ENCOUNTER — Emergency Department: Payer: Medicare Other

## 2019-06-15 ENCOUNTER — Encounter: Payer: Self-pay | Admitting: Emergency Medicine

## 2019-06-15 DIAGNOSIS — J449 Chronic obstructive pulmonary disease, unspecified: Secondary | ICD-10-CM | POA: Diagnosis present

## 2019-06-15 DIAGNOSIS — M7989 Other specified soft tissue disorders: Secondary | ICD-10-CM | POA: Diagnosis not present

## 2019-06-15 DIAGNOSIS — Z91013 Allergy to seafood: Secondary | ICD-10-CM

## 2019-06-15 DIAGNOSIS — G4733 Obstructive sleep apnea (adult) (pediatric): Secondary | ICD-10-CM | POA: Diagnosis present

## 2019-06-15 DIAGNOSIS — R109 Unspecified abdominal pain: Secondary | ICD-10-CM

## 2019-06-15 DIAGNOSIS — N179 Acute kidney failure, unspecified: Principal | ICD-10-CM

## 2019-06-15 DIAGNOSIS — M1A9XX0 Chronic gout, unspecified, without tophus (tophi): Secondary | ICD-10-CM | POA: Diagnosis present

## 2019-06-15 DIAGNOSIS — Z955 Presence of coronary angioplasty implant and graft: Secondary | ICD-10-CM

## 2019-06-15 DIAGNOSIS — I272 Pulmonary hypertension, unspecified: Secondary | ICD-10-CM | POA: Diagnosis present

## 2019-06-15 DIAGNOSIS — N2889 Other specified disorders of kidney and ureter: Secondary | ICD-10-CM | POA: Diagnosis not present

## 2019-06-15 DIAGNOSIS — E860 Dehydration: Secondary | ICD-10-CM | POA: Diagnosis present

## 2019-06-15 DIAGNOSIS — R34 Anuria and oliguria: Secondary | ICD-10-CM | POA: Diagnosis present

## 2019-06-15 DIAGNOSIS — R531 Weakness: Secondary | ICD-10-CM | POA: Diagnosis not present

## 2019-06-15 DIAGNOSIS — Z452 Encounter for adjustment and management of vascular access device: Secondary | ICD-10-CM

## 2019-06-15 DIAGNOSIS — E871 Hypo-osmolality and hyponatremia: Secondary | ICD-10-CM | POA: Diagnosis not present

## 2019-06-15 DIAGNOSIS — Z7982 Long term (current) use of aspirin: Secondary | ICD-10-CM

## 2019-06-15 DIAGNOSIS — E1129 Type 2 diabetes mellitus with other diabetic kidney complication: Secondary | ICD-10-CM | POA: Diagnosis not present

## 2019-06-15 DIAGNOSIS — E785 Hyperlipidemia, unspecified: Secondary | ICD-10-CM | POA: Diagnosis present

## 2019-06-15 DIAGNOSIS — I509 Heart failure, unspecified: Secondary | ICD-10-CM | POA: Diagnosis not present

## 2019-06-15 DIAGNOSIS — Z95828 Presence of other vascular implants and grafts: Secondary | ICD-10-CM | POA: Diagnosis not present

## 2019-06-15 DIAGNOSIS — I5033 Acute on chronic diastolic (congestive) heart failure: Secondary | ICD-10-CM | POA: Diagnosis present

## 2019-06-15 DIAGNOSIS — J9601 Acute respiratory failure with hypoxia: Secondary | ICD-10-CM | POA: Diagnosis not present

## 2019-06-15 DIAGNOSIS — L03116 Cellulitis of left lower limb: Secondary | ICD-10-CM | POA: Diagnosis not present

## 2019-06-15 DIAGNOSIS — I428 Other cardiomyopathies: Secondary | ICD-10-CM | POA: Diagnosis present

## 2019-06-15 DIAGNOSIS — D631 Anemia in chronic kidney disease: Secondary | ICD-10-CM | POA: Diagnosis present

## 2019-06-15 DIAGNOSIS — R0602 Shortness of breath: Secondary | ICD-10-CM | POA: Diagnosis not present

## 2019-06-15 DIAGNOSIS — K5900 Constipation, unspecified: Secondary | ICD-10-CM | POA: Diagnosis not present

## 2019-06-15 DIAGNOSIS — J9690 Respiratory failure, unspecified, unspecified whether with hypoxia or hypercapnia: Secondary | ICD-10-CM | POA: Diagnosis not present

## 2019-06-15 DIAGNOSIS — Z79891 Long term (current) use of opiate analgesic: Secondary | ICD-10-CM

## 2019-06-15 DIAGNOSIS — Z8249 Family history of ischemic heart disease and other diseases of the circulatory system: Secondary | ICD-10-CM

## 2019-06-15 DIAGNOSIS — Z841 Family history of disorders of kidney and ureter: Secondary | ICD-10-CM

## 2019-06-15 DIAGNOSIS — E875 Hyperkalemia: Secondary | ICD-10-CM | POA: Diagnosis present

## 2019-06-15 DIAGNOSIS — N183 Chronic kidney disease, stage 3 (moderate): Secondary | ICD-10-CM | POA: Diagnosis not present

## 2019-06-15 DIAGNOSIS — M25511 Pain in right shoulder: Secondary | ICD-10-CM | POA: Diagnosis not present

## 2019-06-15 DIAGNOSIS — I132 Hypertensive heart and chronic kidney disease with heart failure and with stage 5 chronic kidney disease, or end stage renal disease: Secondary | ICD-10-CM | POA: Diagnosis present

## 2019-06-15 DIAGNOSIS — E1122 Type 2 diabetes mellitus with diabetic chronic kidney disease: Secondary | ICD-10-CM | POA: Diagnosis present

## 2019-06-15 DIAGNOSIS — R0902 Hypoxemia: Secondary | ICD-10-CM | POA: Diagnosis not present

## 2019-06-15 DIAGNOSIS — N186 End stage renal disease: Secondary | ICD-10-CM

## 2019-06-15 DIAGNOSIS — Z6841 Body Mass Index (BMI) 40.0 and over, adult: Secondary | ICD-10-CM

## 2019-06-15 DIAGNOSIS — Z20828 Contact with and (suspected) exposure to other viral communicable diseases: Secondary | ICD-10-CM | POA: Diagnosis present

## 2019-06-15 DIAGNOSIS — R0600 Dyspnea, unspecified: Secondary | ICD-10-CM | POA: Diagnosis not present

## 2019-06-15 DIAGNOSIS — I2510 Atherosclerotic heart disease of native coronary artery without angina pectoris: Secondary | ICD-10-CM | POA: Diagnosis present

## 2019-06-15 DIAGNOSIS — J9621 Acute and chronic respiratory failure with hypoxia: Secondary | ICD-10-CM

## 2019-06-15 DIAGNOSIS — G510 Bell's palsy: Secondary | ICD-10-CM | POA: Diagnosis present

## 2019-06-15 DIAGNOSIS — J9600 Acute respiratory failure, unspecified whether with hypoxia or hypercapnia: Secondary | ICD-10-CM

## 2019-06-15 DIAGNOSIS — Z9981 Dependence on supplemental oxygen: Secondary | ICD-10-CM

## 2019-06-15 DIAGNOSIS — I10 Essential (primary) hypertension: Secondary | ICD-10-CM | POA: Diagnosis not present

## 2019-06-15 DIAGNOSIS — Z9119 Patient's noncompliance with other medical treatment and regimen: Secondary | ICD-10-CM

## 2019-06-15 DIAGNOSIS — E1165 Type 2 diabetes mellitus with hyperglycemia: Secondary | ICD-10-CM | POA: Diagnosis not present

## 2019-06-15 DIAGNOSIS — N182 Chronic kidney disease, stage 2 (mild): Secondary | ICD-10-CM | POA: Diagnosis not present

## 2019-06-15 DIAGNOSIS — E1142 Type 2 diabetes mellitus with diabetic polyneuropathy: Secondary | ICD-10-CM | POA: Diagnosis present

## 2019-06-15 DIAGNOSIS — R1084 Generalized abdominal pain: Secondary | ICD-10-CM | POA: Diagnosis not present

## 2019-06-15 DIAGNOSIS — E876 Hypokalemia: Secondary | ICD-10-CM | POA: Diagnosis not present

## 2019-06-15 DIAGNOSIS — Z79899 Other long term (current) drug therapy: Secondary | ICD-10-CM

## 2019-06-15 DIAGNOSIS — Z794 Long term (current) use of insulin: Secondary | ICD-10-CM

## 2019-06-15 DIAGNOSIS — I129 Hypertensive chronic kidney disease with stage 1 through stage 4 chronic kidney disease, or unspecified chronic kidney disease: Secondary | ICD-10-CM | POA: Diagnosis not present

## 2019-06-15 DIAGNOSIS — N189 Chronic kidney disease, unspecified: Secondary | ICD-10-CM | POA: Diagnosis not present

## 2019-06-15 DIAGNOSIS — R52 Pain, unspecified: Secondary | ICD-10-CM

## 2019-06-15 LAB — RENAL FUNCTION PANEL
Albumin: 2.3 g/dL — ABNORMAL LOW (ref 3.5–5.0)
Anion gap: 14 (ref 5–15)
BUN: 108 mg/dL — ABNORMAL HIGH (ref 6–20)
CO2: 21 mmol/L — ABNORMAL LOW (ref 22–32)
Calcium: 7 mg/dL — ABNORMAL LOW (ref 8.9–10.3)
Chloride: 97 mmol/L — ABNORMAL LOW (ref 98–111)
Creatinine, Ser: 16.9 mg/dL — ABNORMAL HIGH (ref 0.61–1.24)
GFR calc Af Amer: 3 mL/min — ABNORMAL LOW (ref >60)
GFR calc non Af Amer: 3 mL/min — ABNORMAL LOW (ref >60)
Glucose, Bld: 187 mg/dL — ABNORMAL HIGH (ref 70–99)
Phosphorus: 11.4 mg/dL — ABNORMAL HIGH (ref 2.5–4.6)
Potassium: 7.4 mmol/L (ref 3.5–5.1)
Sodium: 132 mmol/L — ABNORMAL LOW (ref 135–145)

## 2019-06-15 LAB — COMPREHENSIVE METABOLIC PANEL
ALT: 11 U/L (ref 0–44)
AST: 12 U/L — ABNORMAL LOW (ref 15–41)
Albumin: 2.4 g/dL — ABNORMAL LOW (ref 3.5–5.0)
Alkaline Phosphatase: 140 U/L — ABNORMAL HIGH (ref 38–126)
Anion gap: 16 — ABNORMAL HIGH (ref 5–15)
BUN: 109 mg/dL — ABNORMAL HIGH (ref 6–20)
CO2: 18 mmol/L — ABNORMAL LOW (ref 22–32)
Calcium: 7.1 mg/dL — ABNORMAL LOW (ref 8.9–10.3)
Chloride: 97 mmol/L — ABNORMAL LOW (ref 98–111)
Creatinine, Ser: 16.23 mg/dL — ABNORMAL HIGH (ref 0.61–1.24)
GFR calc Af Amer: 3 mL/min — ABNORMAL LOW (ref >60)
GFR calc non Af Amer: 3 mL/min — ABNORMAL LOW (ref >60)
Glucose, Bld: 262 mg/dL — ABNORMAL HIGH (ref 70–99)
Potassium: 7.2 mmol/L (ref 3.5–5.1)
Sodium: 131 mmol/L — ABNORMAL LOW (ref 135–145)
Total Bilirubin: 0.6 mg/dL (ref 0.3–1.2)
Total Protein: 6.7 g/dL (ref 6.5–8.1)

## 2019-06-15 LAB — CBC WITH DIFFERENTIAL/PLATELET
Abs Immature Granulocytes: 0.09 10*3/uL — ABNORMAL HIGH (ref 0.00–0.07)
Basophils Absolute: 0 10*3/uL (ref 0.0–0.1)
Basophils Relative: 0 %
Eosinophils Absolute: 0.1 10*3/uL (ref 0.0–0.5)
Eosinophils Relative: 1 %
HCT: 32.7 % — ABNORMAL LOW (ref 39.0–52.0)
Hemoglobin: 9.8 g/dL — ABNORMAL LOW (ref 13.0–17.0)
Immature Granulocytes: 1 %
Lymphocytes Relative: 14 %
Lymphs Abs: 1.7 10*3/uL (ref 0.7–4.0)
MCH: 23.5 pg — ABNORMAL LOW (ref 26.0–34.0)
MCHC: 30 g/dL (ref 30.0–36.0)
MCV: 78.4 fL — ABNORMAL LOW (ref 80.0–100.0)
Monocytes Absolute: 1 10*3/uL (ref 0.1–1.0)
Monocytes Relative: 8 %
Neutro Abs: 9.8 10*3/uL — ABNORMAL HIGH (ref 1.7–7.7)
Neutrophils Relative %: 76 %
Platelets: 236 10*3/uL (ref 150–400)
RBC: 4.17 MIL/uL — ABNORMAL LOW (ref 4.22–5.81)
RDW: 18.7 % — ABNORMAL HIGH (ref 11.5–15.5)
WBC: 12.8 10*3/uL — ABNORMAL HIGH (ref 4.0–10.5)
nRBC: 0.2 % (ref 0.0–0.2)

## 2019-06-15 LAB — BRAIN NATRIURETIC PEPTIDE: B Natriuretic Peptide: 173 pg/mL — ABNORMAL HIGH (ref 0.0–100.0)

## 2019-06-15 LAB — BASIC METABOLIC PANEL
Anion gap: 15 (ref 5–15)
BUN: 106 mg/dL — ABNORMAL HIGH (ref 6–20)
CO2: 20 mmol/L — ABNORMAL LOW (ref 22–32)
Calcium: 7.2 mg/dL — ABNORMAL LOW (ref 8.9–10.3)
Chloride: 98 mmol/L (ref 98–111)
Creatinine, Ser: 16.44 mg/dL — ABNORMAL HIGH (ref 0.61–1.24)
GFR calc Af Amer: 3 mL/min — ABNORMAL LOW (ref >60)
GFR calc non Af Amer: 3 mL/min — ABNORMAL LOW (ref >60)
Glucose, Bld: 174 mg/dL — ABNORMAL HIGH (ref 70–99)
Potassium: 6.6 mmol/L (ref 3.5–5.1)
Sodium: 133 mmol/L — ABNORMAL LOW (ref 135–145)

## 2019-06-15 LAB — PROTIME-INR
INR: 1.1 (ref 0.8–1.2)
Prothrombin Time: 14.3 seconds (ref 11.4–15.2)

## 2019-06-15 LAB — CREATININE, SERUM
Creatinine, Ser: 16.42 mg/dL — ABNORMAL HIGH (ref 0.61–1.24)
GFR calc Af Amer: 3 mL/min — ABNORMAL LOW (ref >60)
GFR calc non Af Amer: 3 mL/min — ABNORMAL LOW (ref >60)

## 2019-06-15 LAB — POTASSIUM: Potassium: 6.7 mmol/L (ref 3.5–5.1)

## 2019-06-15 LAB — SARS CORONAVIRUS 2 BY RT PCR (HOSPITAL ORDER, PERFORMED IN ~~LOC~~ HOSPITAL LAB): SARS Coronavirus 2: NEGATIVE

## 2019-06-15 LAB — TROPONIN I (HIGH SENSITIVITY): Troponin I (High Sensitivity): 24 ng/L — ABNORMAL HIGH (ref <18)

## 2019-06-15 LAB — LACTIC ACID, PLASMA: Lactic Acid, Venous: 0.6 mmol/L (ref 0.5–1.9)

## 2019-06-15 LAB — MAGNESIUM: Magnesium: 1.9 mg/dL (ref 1.7–2.4)

## 2019-06-15 MED ORDER — ACETAMINOPHEN 325 MG PO TABS
650.0000 mg | ORAL_TABLET | Freq: Four times a day (QID) | ORAL | Status: DC | PRN
  Filled 2019-06-15 (×2): qty 2

## 2019-06-15 MED ORDER — AMLODIPINE BESYLATE 5 MG PO TABS
5.0000 mg | ORAL_TABLET | Freq: Every day | ORAL | Status: DC
  Administered 2019-06-15 – 2019-06-25 (×7): 5 mg via ORAL
  Filled 2019-06-15 (×7): qty 1

## 2019-06-15 MED ORDER — CARVEDILOL 25 MG PO TABS
25.0000 mg | ORAL_TABLET | Freq: Two times a day (BID) | ORAL | Status: DC
  Administered 2019-06-19 – 2019-06-25 (×12): 25 mg via ORAL
  Filled 2019-06-15: qty 1
  Filled 2019-06-15: qty 2
  Filled 2019-06-15 (×2): qty 1
  Filled 2019-06-15 (×2): qty 2
  Filled 2019-06-15 (×2): qty 1
  Filled 2019-06-15 (×2): qty 2
  Filled 2019-06-15 (×2): qty 1

## 2019-06-15 MED ORDER — LIDOCAINE HCL (PF) 1 % IJ SOLN
5.0000 mL | Freq: Once | INTRAMUSCULAR | Status: AC
  Administered 2019-06-15: 5 mL via INTRADERMAL
  Filled 2019-06-15: qty 5

## 2019-06-15 MED ORDER — ACETAMINOPHEN 650 MG RE SUPP: 650.0000 mg | Freq: Four times a day (QID) | RECTAL | Status: DC | PRN

## 2019-06-15 MED ORDER — DEXTROSE 50 % IV SOLN
25.0000 mL | Freq: Once | INTRAVENOUS | Status: AC
  Administered 2019-06-15: 25 mL via INTRAVENOUS
  Filled 2019-06-15: qty 50

## 2019-06-15 MED ORDER — ALBUTEROL SULFATE HFA 108 (90 BASE) MCG/ACT IN AERS
2.0000 | INHALATION_SPRAY | RESPIRATORY_TRACT | Status: DC | PRN
  Filled 2019-06-15: qty 6.7

## 2019-06-15 MED ORDER — ONDANSETRON HCL 4 MG PO TABS: 4.0000 mg | ORAL_TABLET | Freq: Four times a day (QID) | ORAL | Status: DC | PRN

## 2019-06-15 MED ORDER — ISOSORBIDE MONONITRATE ER 30 MG PO TB24
60.0000 mg | ORAL_TABLET | Freq: Every day | ORAL | Status: DC
  Administered 2019-06-15 – 2019-06-25 (×7): 60 mg via ORAL
  Filled 2019-06-15 (×7): qty 2

## 2019-06-15 MED ORDER — HYDROCODONE-ACETAMINOPHEN 5-325 MG PO TABS
1.0000 | ORAL_TABLET | ORAL | Status: DC | PRN
  Administered 2019-06-15: 2 via ORAL
  Administered 2019-06-16 – 2019-06-17 (×3): 1 via ORAL
  Filled 2019-06-15: qty 2
  Filled 2019-06-15 (×3): qty 1

## 2019-06-15 MED ORDER — HYDRALAZINE HCL 25 MG PO TABS
25.0000 mg | ORAL_TABLET | Freq: Three times a day (TID) | ORAL | Status: DC
  Administered 2019-06-19 – 2019-06-25 (×14): 25 mg via ORAL
  Filled 2019-06-15 (×12): qty 1

## 2019-06-15 MED ORDER — HEPARIN SODIUM (PORCINE) 1000 UNIT/ML DIALYSIS
1000.0000 [IU] | INTRAMUSCULAR | Status: DC | PRN
  Administered 2019-06-18: 2800 [IU] via INTRAVENOUS_CENTRAL
  Administered 2019-06-21: 1000 [IU] via INTRAVENOUS_CENTRAL
  Filled 2019-06-15 (×3): qty 6

## 2019-06-15 MED ORDER — LIDOCAINE HCL 1 % IJ SOLN
5.0000 mL | Freq: Once | INTRAMUSCULAR | Status: AC
  Administered 2019-06-15: 5 mL via INTRADERMAL
  Filled 2019-06-15: qty 10

## 2019-06-15 MED ORDER — ONDANSETRON HCL 4 MG/2ML IJ SOLN
4.0000 mg | Freq: Four times a day (QID) | INTRAMUSCULAR | Status: DC | PRN
  Administered 2019-06-17: 4 mg via INTRAVENOUS
  Filled 2019-06-15: qty 2

## 2019-06-15 MED ORDER — HEPARIN SODIUM (PORCINE) 5000 UNIT/ML IJ SOLN
5000.0000 [IU] | Freq: Three times a day (TID) | INTRAMUSCULAR | Status: DC
  Administered 2019-06-15 – 2019-06-25 (×28): 5000 [IU] via SUBCUTANEOUS
  Filled 2019-06-15 (×28): qty 1

## 2019-06-15 MED ORDER — INSULIN ASPART 100 UNIT/ML ~~LOC~~ SOLN
10.0000 [IU] | Freq: Once | SUBCUTANEOUS | Status: AC
  Administered 2019-06-15: 10 [IU] via INTRAVENOUS
  Filled 2019-06-15: qty 1

## 2019-06-15 MED ORDER — CHOLECALCIFEROL 25 MCG (1000 UT) PO TABS: 2000.0000 [IU] | ORAL_TABLET | Freq: Every day | ORAL | Status: DC

## 2019-06-15 MED ORDER — INSULIN ASPART 100 UNIT/ML ~~LOC~~ SOLN
0.0000 [IU] | Freq: Three times a day (TID) | SUBCUTANEOUS | Status: DC
  Administered 2019-06-16: 5 [IU] via SUBCUTANEOUS
  Filled 2019-06-15: qty 1

## 2019-06-15 MED ORDER — ORAL CARE MOUTH RINSE
15.0000 mL | Freq: Two times a day (BID) | OROMUCOSAL | Status: DC
  Administered 2019-06-15 – 2019-06-21 (×10): 15 mL via OROMUCOSAL

## 2019-06-15 MED ORDER — CHOLECALCIFEROL 10 MCG (400 UNIT) PO TABS
2000.0000 [IU] | ORAL_TABLET | Freq: Every day | ORAL | Status: DC
  Administered 2019-06-16 – 2019-06-25 (×8): 2000 [IU] via ORAL
  Filled 2019-06-15 (×10): qty 5

## 2019-06-15 MED ORDER — SODIUM ZIRCONIUM CYCLOSILICATE 10 G PO PACK
10.0000 g | PACK | Freq: Once | ORAL | Status: AC
  Administered 2019-06-15: 10 g via ORAL
  Filled 2019-06-15: qty 1

## 2019-06-15 MED ORDER — ATORVASTATIN CALCIUM 20 MG PO TABS
40.0000 mg | ORAL_TABLET | Freq: Every day | ORAL | Status: DC
  Administered 2019-06-16 – 2019-06-24 (×9): 40 mg via ORAL
  Filled 2019-06-15 (×9): qty 2

## 2019-06-15 MED ORDER — TRAZODONE HCL 50 MG PO TABS
25.0000 mg | ORAL_TABLET | Freq: Every evening | ORAL | Status: DC | PRN
  Filled 2019-06-15: qty 1

## 2019-06-15 MED ORDER — PUREFLOW DIALYSIS SOLUTION
INTRAVENOUS | Status: DC
  Administered 2019-06-16 (×5): via INTRAVENOUS_CENTRAL

## 2019-06-15 MED ORDER — SODIUM CHLORIDE 0.9 % IV SOLN: 250.0000 mL | INTRAVENOUS | Status: DC | PRN

## 2019-06-15 MED ORDER — CALCIUM GLUCONATE 10 % IV SOLN
1.0000 g | Freq: Once | INTRAVENOUS | Status: AC
  Administered 2019-06-15: 1 g via INTRAVENOUS
  Filled 2019-06-15: qty 10

## 2019-06-15 MED ORDER — SODIUM CHLORIDE 0.9% FLUSH
3.0000 mL | Freq: Two times a day (BID) | INTRAVENOUS | Status: DC
  Administered 2019-06-15 – 2019-06-18 (×6): 3 mL via INTRAVENOUS
  Administered 2019-06-19: 10 mL via INTRAVENOUS
  Administered 2019-06-19: 3 mL via INTRAVENOUS

## 2019-06-15 MED ORDER — SODIUM CHLORIDE 0.9% FLUSH: 3.0000 mL | INTRAVENOUS | Status: DC | PRN

## 2019-06-15 MED ORDER — UMECLIDINIUM-VILANTEROL 62.5-25 MCG/INH IN AEPB
1.0000 | INHALATION_SPRAY | Freq: Every day | RESPIRATORY_TRACT | Status: DC
  Administered 2019-06-15 – 2019-06-25 (×10): 1 via RESPIRATORY_TRACT
  Filled 2019-06-15 (×2): qty 14

## 2019-06-15 MED ORDER — SENNOSIDES-DOCUSATE SODIUM 8.6-50 MG PO TABS: 1.0000 | ORAL_TABLET | Freq: Every evening | ORAL | Status: DC | PRN

## 2019-06-15 MED ORDER — BISACODYL 5 MG PO TBEC: 5.0000 mg | DELAYED_RELEASE_TABLET | Freq: Every day | ORAL | Status: DC | PRN

## 2019-06-15 NOTE — Progress Notes (Signed)
Advanced Care Plan.  Purpose of Encounter: CODE STATUS. Parties in Attendance: The patient and me. Patient's Decisional Capacity: Yes. Medical Story: [name]  is a 53 y.o. male with a known history of chronic respiratory failure on home oxygen 3 L, CHF, CKD, hypertension, diabetes, OSA, Bell's palsy and pulmonary hypertension etc. The patient is being admitted for acute on chronic respiratory failure with hypoxia due to fluid overload and acute on chronic diastolic CHF, acute renal failure on CKD and hyperkalemia.  I discussed with patient about his critical condition, poor prognosis and CODE STATUS.  The patient does want to be resuscitated and intubated if he has cardiopulmonary arrest. Plan:  Code Status: Full code Time spent discussing advance care planning: 18 minutes.

## 2019-06-15 NOTE — ED Notes (Signed)
Patient placed on 4L [place]. Maintaining O2 sat at 94%.

## 2019-06-15 NOTE — Procedures (Signed)
Hemodialysis Catheter Insertion Procedure Note [name] [id number] 14-Jul-1966  Procedure: Insertion of Hemodialysis Catheter Indications: Dialysis Access   Procedure Details Consent: Risks of procedure as well as the alternatives and risks of each were explained to the (patient/caregiver).  Consent for procedure obtained. Time Out: Verified patient identification, verified procedure, site/side was marked, verified correct patient position, special equipment/implants available, medications/allergies/relevent history reviewed, required imaging and test results available.  Performed  Maximum sterile technique was used including antiseptics, cap, gloves, gown, hand hygiene, mask and sheet. Skin prep: Chlorhexidine; local anesthetic administered Triple lumen hemodialysis catheter was inserted into left femoral vein due to multiple attempts, no other available access using the Seldinger technique.  Evaluation Blood flow good Complications: No apparent complications Patient did tolerate procedure well. Chest X-ray ordered to verify placement.  CXR: pending.   Attempted to place left internal jugular trialysis catheter, however pt unable to remain still.  Therefore, successfully placed left femoral trialysis catheter pt tolerated procedure well.  Marda Stalker, [place] Pager [phone] (please enter 7 digits) PCCM Consult Pager [phone] (please enter 7 digits)

## 2019-06-15 NOTE — Progress Notes (Signed)
Repeat K still high, dialysis catheter placed, discussed with FNP Hinton Dyer, will proceed with CRRT using 0k bath for now and switch to 4k bath once serum K down to 4.  Will follow.

## 2019-06-15 NOTE — Consult Note (Signed)
CRITICAL CARE NOTE      CHIEF COMPLAINT:   Acute Kidney Injury and severe hyperkalemia   HPI   This is a pleasant 53 year old male with a history of diabetes, essential hypertension, psoriasis, obstructive sleep apnea, congestive heart failure Bell's palsy who came in due to weakness and worsening lower extremity and abdominal edema.  Patient states that he has not been compliant with his diabetic and hypertensive regimen.  In the ED patient was found to have acute kidney injury with creatinine over 16 and hyperkalemia with a K over 7.  Hospitalist had upgraded patient to medical intensive care unit for urgent hemodialysis after discussion with nephrologist.  PAST MEDICAL HISTORY   Past Medical History:  Diagnosis Date   Bell's palsy    CHF (congestive heart failure) ([place])    Chronic kidney disease    Diabetes mellitus without complication (HCC)    Hypertension    NSVT (nonsustained ventricular tachycardia) (HCC)    Obstructive sleep apnea    Psoriasis    Pulmonary HTN ([place])      SURGICAL HISTORY   Past Surgical History:  Procedure Laterality Date   CORONARY ANGIOPLASTY WITH STENT PLACEMENT     NO PAST SURGERIES       FAMILY HISTORY   Family History  Problem Relation Age of Onset   Heart failure Mother    Kidney failure Brother      SOCIAL HISTORY   Social History   Tobacco Use   Smoking status: Never Smoker   Smokeless tobacco: Never Used  Substance Use Topics   Alcohol use: Yes    Alcohol/week: 2.0 standard drinks    Types: 1 Shots of liquor, 1 Cans of beer per week    Comment: very rarely 1-2 times per month   Drug use: No     MEDICATIONS   Current Medication:  Current Facility-Administered Medications:    0.9 %  sodium chloride infusion, 250 mL,  Intravenous, PRN, Demetrios Loll, MD   acetaminophen (TYLENOL) tablet 650 mg, 650 mg, Oral, Q6H PRN **OR** acetaminophen (TYLENOL) suppository 650 mg, 650 mg, Rectal, Q6H PRN, Demetrios Loll, MD   albuterol (VENTOLIN HFA) 108 (90 Base) MCG/ACT inhaler 2 puff, 2 puff, Inhalation, Q4H PRN, Demetrios Loll, MD   amLODipine (NORVASC) tablet 5 mg, 5 mg, Oral, Daily, Demetrios Loll, MD   atorvastatin (LIPITOR) tablet 40 mg, 40 mg, Oral, QHS, Demetrios Loll, MD   bisacodyl (DULCOLAX) EC tablet 5 mg, 5 mg, Oral, Daily PRN, Demetrios Loll, MD   carvedilol (COREG) tablet 25 mg, 25 mg, Oral, Q12H, Demetrios Loll, MD   Derrill Memo ON 06/16/2019] cholecalciferol (VITAMIN D3) tablet 2,000 Units, 2,000 Units, Oral, Daily, Charlett Nose, RPH   heparin injection 5,000 Units, 5,000 Units, Subcutaneous, Q8H, Demetrios Loll, MD   hydrALAZINE (APRESOLINE) tablet 25 mg, 25 mg, Oral, Q8H, Demetrios Loll, MD   HYDROcodone-acetaminophen (NORCO/VICODIN) 5-325 MG per tablet 1-2 tablet, 1-2 tablet, Oral, Q4H PRN, Demetrios Loll, MD   Derrill Memo ON 06/16/2019] insulin aspart (novoLOG) injection 0-9 Units, 0-9 Units, Subcutaneous, TID WC, Demetrios Loll, MD   isosorbide mononitrate (IMDUR) 24 hr tablet 60 mg, 60 mg, Oral, Daily, Demetrios Loll, MD   ondansetron [facility]) tablet 4 mg, 4 mg, Oral, Q6H PRN **OR** ondansetron (ZOFRAN) injection 4 mg, 4 mg, Intravenous, Q6H PRN, Demetrios Loll, MD   senna-docusate (Senokot-S) tablet 1 tablet, 1 tablet, Oral, QHS PRN, Demetrios Loll, MD   sodium chloride flush (NS) 0.9 % injection 3 mL, 3 mL, Intravenous,  Q12H, Demetrios Loll, MD   sodium chloride flush (NS) 0.9 % injection 3 mL, 3 mL, Intravenous, PRN, Demetrios Loll, MD   traZODone (DESYREL) tablet 25-50 mg, 25-50 mg, Oral, QHS PRN, Demetrios Loll, MD   umeclidinium-vilanterol (ANORO ELLIPTA) 62.5-25 MCG/INH 1 puff, 1 puff, Inhalation, Daily, Demetrios Loll, MD    ALLERGIES   Shellfish allergy    REVIEW OF SYSTEMS    10 point ROS conducted is negative except for  hunger  PHYSICAL EXAMINATION   Vitals:   06/15/19 1700 06/15/19 1809  BP: 127/82   Pulse: 90 90  Resp: 11 14  Temp:  98.3 F (36.8 C)  SpO2: 95% 91%    GENERAL: Mild distress due to shortness of breath and lower extremity pain HEAD: Normocephalic, atraumatic.  EYES: Pupils equal, round, reactive to light.  No scleral icterus.  MOUTH: Moist mucosal membrane. NECK: Supple. No thyromegaly. No nodules. No JVD.  PULMONARY: Decreased breath sounds bilaterally CARDIOVASCULAR: S1 and S2. Regular rate and rhythm. No murmurs, rubs, or gallops.  GASTROINTESTINAL: Soft, nontender, non-distended. No masses. Positive bowel sounds. No hepatosplenomegaly.  MUSCULOSKELETAL: No swelling, clubbing, or 3+ edema NEUROLOGIC: Mild distress due to acute illness SKIN:intact,warm,dry   LABS AND IMAGING     LAB RESULTS: Recent Labs  Lab 06/15/19 1348  NA 131*  K 7.2*  CL 97*  CO2 18*  BUN 109*  CREATININE 16.23*  GLUCOSE 262*   Recent Labs  Lab 06/15/19 1348  HGB 9.8*  HCT 32.7*  WBC 12.8*  PLT 236     IMAGING RESULTS: Dg Chest Port 1 View  Result Date: 06/15/2019 CLINICAL DATA:  Shortness of breath. History of pulmonary hypertension. EXAM: PORTABLE CHEST 1 VIEW COMPARISON:  02/21/2019 FINDINGS: Cardiac enlargement and pulmonary vascular congestion. No pleural effusion or airspace opacities. Mild asymmetric elevation of right hemidiaphragm. IMPRESSION: Cardiac enlargement with pulmonary vascular congestion. Electronically Signed   By: Kerby Moors M.D.   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 with noncompliance -Nephrology on case-appreciate input following recommendations -Emergent HD -follow chem 7 -follow UO -continue Foley Catheter-assess need daily    Obstructive sleep apnea -May provide CPAP with home settings as per RT   GI/Nutrition GI PROPHYLAXIS as indicated DIET-->TF's as tolerated Constipation protocol as indicated  ENDO - ICU hypoglycemic\Hyperglycemia protocol -check FSBS per protocol   ELECTROLYTES -follow labs as needed -replace as needed -pharmacy consultation   DVT/GI PRX ordered -SCDs  TRANSFUSIONS AS NEEDED MONITOR FSBS ASSESS the need for LABS as needed   Critical care provider statement:    Critical care time (minutes):  32   Critical care time was exclusive of:  Separately billable procedures and treating other patients   Critical care was necessary to treat or prevent imminent or life-threatening deterioration of the following conditions:   Severe hyperkalemia, acute on chronic renal insufficiency stage IV, morbid obesity, pulmonary hypertension, obstructive sleep apnea   Critical care was time spent personally by me on the following activities:  Development of treatment plan with patient or surrogate, discussions with consultants, evaluation of patient's response to treatment, examination of patient, obtaining history from patient or surrogate, ordering and performing treatments and interventions, ordering and review of laboratory studies and re-evaluation of patient's condition.  I assumed direction of critical care for this patient from another provider in my specialty: no    This document was prepared using Dragon voice recognition software and may include unintentional dictation errors.    Ottie Glazier, M.D.  Division of [place]

## 2019-06-15 NOTE — ED Notes (Signed)
ED TO INPATIENT HANDOFF REPORT  ED Nurse Name and Phone #: laura [phone]  S Name/Age/Gender [name] 53 y.o. male Room/Bed: ED01A/ED01A  Code Status   Code Status: Prior  Home/SNF/Other Home Patient oriented to: self, place, time and situation Is this baseline? Yes   Triage Complete: Triage complete  Chief Complaint weakness  Triage Note Patient from home via [facility]. Patient reports worsening weakness, body aches and shortness of breath x2 weeks. States he has tried to avoid coming to doctor as long as possible. Patient denies fever at home. Denies cough. Patient with history of COPD. States he wears 3L [place] at baseline and is supposed to wear cpap at night and as needed during the day but states he does not use it like he is supposed to. Per EMS, patient was 75% on 3L upon their arrival to his home. Patient placed on 10L non-rebreather with increase to 100%.    Allergies Allergies  Allergen Reactions  . Shellfish Allergy Anaphylaxis    Face and throat swelling, difficulty breathing Allergy can be triggered by touching (contact)    Level of Care/Admitting Diagnosis ED Disposition    ED Disposition Condition [facility] Area: [place] [[id number]]  Level of Care: Stepdown [14]  Covid Evaluation: Confirmed COVID Negative  Diagnosis: Hyperkalemia [[id number]]  Admitting Physician: Demetrios Loll [[id number]]  Attending Physician: Demetrios Loll [[id number]]  Estimated length of stay: 5 - 7 days  Certification:: I certify this patient will need inpatient services for at least 2 midnights  PT Class (Do Not Modify): Inpatient [101]  PT Acc Code (Do Not Modify): Private [1]       B Medical/Surgery History Past Medical History:  Diagnosis Date  . Bell's palsy   . CHF (congestive heart failure) ([place])   . Chronic kidney disease   . Diabetes mellitus without complication ([place])   . Hypertension   . NSVT (nonsustained ventricular tachycardia) ([place])    . Obstructive sleep apnea   . Psoriasis   . Pulmonary HTN ([place])    Past Surgical History:  Procedure Laterality Date  . CORONARY ANGIOPLASTY WITH STENT PLACEMENT    . NO PAST SURGERIES       A IV Location/Drains/Wounds Patient Lines/Drains/Airways Status   Active Line/Drains/Airways    Name:   Placement date:   Placement time:   Site:   Days:   Peripheral IV 02/18/19 Left Other (Comment)   02/18/19    1233    Other (Comment)   117   Peripheral IV 06/15/19 Left Other (Comment)   06/15/19    1340    Other (Comment)   less than 1   Peripheral IV 06/15/19 Right Antecubital   06/15/19    1542    Antecubital   less than 1   Wound / Incision (Open or Dehisced) 11/20/16 Other (Comment) Leg Right;Lower small indention in calf   11/20/16    2300    Leg   937          Intake/Output Last 24 hours No intake or output data in the 24 hours ending 06/15/19 1634  Labs/Imaging Results for orders placed or performed during the hospital encounter of 06/15/19 (from the past 48 hour(s))  CBC with Differential     Status: Abnormal   Collection Time: 06/15/19  1:48 PM  Result Value Ref Range   WBC 12.8 (H) 4.0 - 10.5 K/uL   RBC 4.17 (L) 4.22 - 5.81 MIL/uL  Hemoglobin 9.8 (L) 13.0 - 17.0 g/dL   HCT 32.7 (L) 39.0 - 52.0 %   MCV 78.4 (L) 80.0 - 100.0 fL   MCH 23.5 (L) 26.0 - 34.0 pg   MCHC 30.0 30.0 - 36.0 g/dL   RDW 18.7 (H) 11.5 - 15.5 %   Platelets 236 150 - 400 K/uL   nRBC 0.2 0.0 - 0.2 %   Neutrophils Relative % 76 %   Neutro Abs 9.8 (H) 1.7 - 7.7 K/uL   Lymphocytes Relative 14 %   Lymphs Abs 1.7 0.7 - 4.0 K/uL   Monocytes Relative 8 %   Monocytes Absolute 1.0 0.1 - 1.0 K/uL   Eosinophils Relative 1 %   Eosinophils Absolute 0.1 0.0 - 0.5 K/uL   Basophils Relative 0 %   Basophils Absolute 0.0 0.0 - 0.1 K/uL   Immature Granulocytes 1 %   Abs Immature Granulocytes 0.09 (H) 0.00 - 0.07 K/uL    Comment: Performed at [facility], [address] [place], [place] [address]   Comprehensive metabolic panel     Status: Abnormal   Collection Time: 06/15/19  1:48 PM  Result Value Ref Range   Sodium 131 (L) 135 - 145 mmol/L   Potassium 7.2 (HH) 3.5 - 5.1 mmol/L    Comment: CRITICAL RESULT CALLED TO, READ BACK BY AND VERIFIED WITH LAURA CATES/CHC/1509/06/14/2020    Chloride 97 (L) 98 - 111 mmol/L   CO2 18 (L) 22 - 32 mmol/L   Glucose, Bld 262 (H) 70 - 99 mg/dL   BUN 109 (H) 6 - 20 mg/dL    Comment: RESULT CONFIRMED BY MANUAL DILUTION CHC   Creatinine, Ser 16.23 (H) 0.61 - 1.24 mg/dL   Calcium 7.1 (L) 8.9 - 10.3 mg/dL   Total Protein 6.7 6.5 - 8.1 g/dL   Albumin 2.4 (L) 3.5 - 5.0 g/dL   AST 12 (L) 15 - 41 U/L   ALT 11 0 - 44 U/L   Alkaline Phosphatase 140 (H) 38 - 126 U/L   Total Bilirubin 0.6 0.3 - 1.2 mg/dL   GFR calc non Af Amer 3 (L) >60 mL/min   GFR calc Af Amer 3 (L) >60 mL/min   Anion gap 16 (H) 5 - 15    Comment: Performed at [facility], [address] [place], [place] [address]  Troponin I (High Sensitivity)     Status: Abnormal   Collection Time: 06/15/19  1:48 PM  Result Value Ref Range   Troponin I (High Sensitivity) 24 (H) <18 ng/L    Comment: (NOTE) Elevated high sensitivity troponin I (hsTnI) values and significant  changes across serial measurements may suggest ACS but many other  chronic and acute conditions are known to elevate hsTnI results.  Refer to the "Links" section for chest pain algorithms and additional  guidance. Performed at [facility] [address] [place], [place] [address]   Lactic acid, plasma     Status: None   Collection Time: 06/15/19  1:48 PM  Result Value Ref Range   Lactic Acid, Venous 0.6 0.5 - 1.9 mmol/L    Comment: Performed at [facility], [address] [place], [place] [address]  Brain natriuretic peptide     Status: Abnormal   Collection Time: 06/15/19  1:48 PM  Result Value Ref Range   B Natriuretic Peptide 173.0 (H) 0.0 - 100.0 pg/mL    Comment: Performed at  [facility], [address], [place], [place] [address]  SARS Coronavirus 2 (CEPHEID- Performed in [place] hospital lab), Hosp Order     Status: None   Collection Time: 06/15/19  1:57 PM   Specimen: Nasopharyngeal Swab  Result Value Ref Range   SARS Coronavirus 2 NEGATIVE NEGATIVE    Comment: (NOTE) If result is NEGATIVE SARS-CoV-2 target nucleic acids are NOT DETECTED. The SARS-CoV-2 RNA is generally detectable in upper and lower  respiratory specimens during the acute phase of infection. The lowest  concentration of SARS-CoV-2 viral copies this assay can detect is 250  copies / mL. A negative result does not preclude SARS-CoV-2 infection  and should not be used as the sole basis for treatment or other  patient management decisions.  A negative result may occur with  improper specimen collection / handling, submission of specimen other  than nasopharyngeal swab, presence of viral mutation(s) within the  areas targeted by this assay, and inadequate number of viral copies  (<250 copies / mL). A negative result must be combined with clinical  observations, patient history, and epidemiological information. If result is POSITIVE SARS-CoV-2 target nucleic acids are DETECTED. The SARS-CoV-2 RNA is generally detectable in upper and lower  respiratory specimens dur ing the acute phase of infection.  Positive  results are indicative of active infection with SARS-CoV-2.  Clinical  correlation with patient history and other diagnostic information is  necessary to determine patient infection status.  Positive results do  not rule out bacterial infection or co-infection with other viruses. If result is PRESUMPTIVE POSTIVE SARS-CoV-2 nucleic acids MAY BE PRESENT.   A presumptive positive result was obtained on the submitted specimen  and confirmed on repeat testing.  While 2019 novel coronavirus  (SARS-CoV-2) nucleic acids may be present in the submitted sample  additional  confirmatory testing may be necessary for epidemiological  and / or clinical management purposes  to differentiate between  SARS-CoV-2 and other Sarbecovirus currently known to infect humans.  If clinically indicated additional testing with an alternate test  methodology [id number]) is advised. The SARS-CoV-2 RNA is generally  detectable in upper and lower respiratory sp ecimens during the acute  phase of infection. The expected result is Negative. Fact Sheet for Patients:  StrictlyIdeas.no Fact Sheet for Healthcare Providers: BankingDealers.co.za This test is not yet approved or cleared by the [place] FDA and has been authorized for detection and/or diagnosis of SARS-CoV-2 by FDA under an Emergency Use Authorization (EUA).  This EUA will remain in effect (meaning this test can be used) for the duration of the COVID-19 declaration under Section 564(b)(1) of the Act, 21 U.S.C. section 360bbb-3(b)(1), unless the authorization is terminated or revoked sooner. Performed at [facility], [address], [place], [place] [address]    Dg Chest Port 1 View  Result Date: 06/15/2019 CLINICAL DATA:  Shortness of breath. History of pulmonary hypertension. EXAM: PORTABLE CHEST 1 VIEW COMPARISON:  02/21/2019 FINDINGS: Cardiac enlargement and pulmonary vascular congestion. No pleural effusion or airspace opacities. Mild asymmetric elevation of right hemidiaphragm. IMPRESSION: Cardiac enlargement with pulmonary vascular congestion. Electronically Signed   By: Kerby Moors M.D.   On: 06/15/2019 14:06    Pending Labs Unresulted Labs (From admission, onward)    Start     Ordered   06/15/19 1356  Blood culture (routine x 2)  BLOOD CULTURE X 2,   STAT     06/15/19 1355   06/15/19 1319  Lactic acid, plasma  Now then every 2 hours,   STAT  06/15/19 1319   Signed and Held  Creatinine, serum  (heparin)  Once,   R    Comments: Baseline for  heparin therapy IF NOT ALREADY DRAWN.    Signed and Held   Signed and Held  Basic metabolic panel  Tomorrow morning,   R     Signed and Held   Signed and Held  CBC  Tomorrow morning,   R     Signed and Held   Signed and Held  Basic metabolic panel  Daily,   R     Signed and Held   Signed and Held  Protime-INR  Add-on,   R     Signed and Held   Signed and Held  PTT factor inhibitor (mixing study)  Add-on,   R     Signed and Held          Vitals/Pain Today's Vitals   06/15/19 1330 06/15/19 1400 06/15/19 1430 06/15/19 1500  BP: (!) 156/91 (!) 152/92 136/83 (!) 151/86  Pulse: 89 90  89  Resp: 19 17 15 15   Temp:      TempSrc:      SpO2: 92% 94%  94%  Weight:      Height:      PainSc:        Isolation Precautions Airborne and Contact precautions  Medications Medications  sodium zirconium cyclosilicate (LOKELMA) packet 10 g (has no administration in time range)  calcium gluconate inj 10% (1 g) URGENT USE ONLY! (1 g Intravenous Given 06/15/19 1531)  insulin aspart (novoLOG) injection 10 Units (10 Units Intravenous Given 06/15/19 1544)  dextrose 50 % solution 25 mL (25 mLs Intravenous Given 06/15/19 1542)  lidocaine (PF) (XYLOCAINE) 1 % injection 5 mL (5 mLs Intradermal Given 06/15/19 1540)    Mobility walks with device Low fall risk   Focused Assessments Pulmonary Assessment Handoff:  Lung sounds: L Breath Sounds: Rales, Expiratory wheezes R Breath Sounds: Expiratory wheezes O2 Device: Nasal Cannula O2 Flow Rate (L/min): 4 L/min      R Recommendations: See Admitting Provider Note  Report given to:   Additional Notes: patient lives alone with home health

## 2019-06-15 NOTE — H&P (Signed)
[facility] [place] NAME: [name]    MR#:  [id number]  DATE OF BIRTH:  07-13-1966  DATE OF ADMISSION:  06/15/2019  PRIMARY CARE PHYSICIAN: Lavera Guise, MD   REQUESTING/REFERRING PHYSICIAN: Dr. Corky Downs  CHIEF COMPLAINT:   Chief Complaint  Patient presents with  . Shortness of Breath  . Weakness   Generalized weakness 1 week and shortness of breath for 2 days. HISTORY OF PRESENT ILLNESS:  [name]  is a 53 y.o. male with a known history of chronic respiratory failure on home oxygen 3 L, CHF, CKD, hypertension, diabetes, OSA, Bell's palsy and pulmonary hypertension etc. The patient presented to ED with above chief complaint.  The patient complains of generalized weakness, poor appetite and weight loss for the past 1 week.  He said he developed shortness of breath and leg swelling for 2 to 3 days.  He also has oliguria.  He is found hypoxia with 3 L oxygen by nasal cannula and put on 4 L oxygen.  Chest x-ray show pulmonary edema.  He is found severe acute renal failure with creatinine up from 1.44 to 16.23.  Potassium 7.2.  He is treated with calcium gluconate, D50, insulin and Lokelma in the ED per Dr. Elwyn Lade recommendation.  Dr. Corky Downs requests admission. PAST MEDICAL HISTORY:    Past Medical History:  Diagnosis Date  . Bell's palsy   . CHF (congestive heart failure) ([place])   . Chronic kidney disease   . Diabetes mellitus without complication ([place])   . Hypertension   . NSVT (nonsustained ventricular tachycardia) ([place])   . Obstructive sleep apnea   . Psoriasis   . Pulmonary HTN ([place])     PAST SURGICAL HISTORY:   Past Surgical History:  Procedure Laterality Date  . CORONARY ANGIOPLASTY WITH STENT PLACEMENT    . NO PAST SURGERIES      SOCIAL HISTORY:   Social History   Tobacco Use  . Smoking status: Never Smoker  . Smokeless tobacco: Never Used  Substance Use Topics  . Alcohol use: Yes    Alcohol/week:  2.0 standard drinks    Types: 1 Shots of liquor, 1 Cans of beer per week    Comment: very rarely 1-2 times per month    FAMILY HISTORY:   Family History  Problem Relation Age of Onset  . Heart failure Mother   . Kidney failure Brother     DRUG ALLERGIES:   Allergies  Allergen Reactions  . Shellfish Allergy Anaphylaxis    Face and throat swelling, difficulty breathing Allergy can be triggered by touching (contact)    REVIEW OF SYSTEMS:   Review of Systems  Constitutional: Positive for malaise/fatigue and weight loss. Negative for chills and fever.  HENT: Negative for sore throat.   Eyes: Negative for blurred vision and double vision.  Respiratory: Positive for shortness of breath. Negative for cough, hemoptysis, wheezing and stridor.   Cardiovascular: Positive for leg swelling. Negative for chest pain, palpitations and orthopnea.  Gastrointestinal: Negative for abdominal pain, blood in stool, diarrhea, melena, nausea and vomiting.  Genitourinary: Negative for dysuria, flank pain and hematuria.       Oliguria  Musculoskeletal: Positive for back pain. Negative for joint pain.  Neurological: Negative for dizziness, sensory change, focal weakness, seizures, loss of consciousness, weakness and headaches.  Endo/Heme/Allergies: Negative for polydipsia.  Psychiatric/Behavioral: Negative for depression. The patient is not nervous/anxious.     MEDICATIONS AT HOME:   Prior  to Admission medications   Medication Sig Start Date End Date Taking? Authorizing Provider  Accu-Chek FastClix Lancets MISC USE TO CHECK BLOOD SUGAR TWICE DAILY DX E11.65 04/21/19   Lavera Guise, MD  albuterol (PROVENTIL HFA;VENTOLIN HFA) 108 (90 Base) MCG/ACT inhaler Inhale 2 puffs into the lungs every 4 (four) hours as needed for wheezing or shortness of breath. 01/20/19   Doles-Johnson, Teah, NP  allopurinol (ZYLOPRIM) 300 MG tablet Take 1 tablet (300 mg total) by mouth daily. 04/30/19   Lavera Guise, MD   amLODipine (NORVASC) 5 MG tablet Take by mouth. 04/03/18   [provider]  aspirin EC 81 MG tablet Take 1 tablet (81 mg total) by mouth daily. 06/02/19   Lavera Guise, MD  atorvastatin (LIPITOR) 40 MG tablet Take 1 tablet (40 mg total) by mouth at bedtime. 04/30/19   Lavera Guise, MD  carvedilol (COREG) 25 MG tablet Take 1 tablet (25 mg total) by mouth every 12 (twelve) hours. 04/30/19   Lavera Guise, MD  Cholecalciferol (D3-1000) 25 MCG (1000 UT) tablet Take 2 tablets (2,000 Units total) by mouth daily. 01/20/19   Doles-Johnson, Teah, NP  cyclobenzaprine (FLEXERIL) 5 MG tablet TAKE ONE TABLET BY MOUTH AT BEDTIME AS NEEDED FOR MUSCLE SPASMS 02/27/19   Doles-Johnson, Teah, NP  Dulaglutide (TRULICITY) 1.5 KP/5.4SF SOPN Use as directed once a week diag e11.65 05/18/19   Lavera Guise, MD  furosemide (LASIX) 40 MG tablet Take 1.5 tablets (60 mg total) by mouth 2 (two) times daily. 04/30/19   Lavera Guise, MD  gabapentin (NEURONTIN) 600 MG tablet Take 1 tablet (600 mg total) by mouth 3 (three) times daily. 04/30/19   Lavera Guise, MD  glucose blood (ACCU-CHEK GUIDE) test strip Use as instructed TWICE DAILY DX E11.65 04/21/19   Lavera Guise, MD  hydrALAZINE (APRESOLINE) 25 MG tablet Take one tab po bid for blood pressure 05/12/19   Lavera Guise, MD  insulin degludec [facility]) 100 UNIT/ML SOPN FlexTouch Pen Inject 12- 16 units at bed time 05/14/19   Lavera Guise, MD  isosorbide mononitrate (IMDUR) 60 MG 24 hr tablet Take 1 tablet (60 mg total) by mouth daily. 04/30/19   Lavera Guise, MD  magnesium oxide (MAG-OX) 400 MG tablet Take 1 tablet (400 mg total) by mouth daily. 04/30/19   Lavera Guise, MD  metolazone (ZAROXOLYN) 2.5 MG tablet Take 1 tablet (2.5 mg total) by mouth as needed. Patient taking differently: Take 2.5 mg by mouth daily as needed (bloating or swelling).  01/20/19   Doles-Johnson, Teah, NP  omeprazole (PRILOSEC) 20 MG capsule Take 1 capsule (20 mg total) by mouth 2 (two) times  daily before a meal. 04/30/19   Lavera Guise, MD  OXYGEN Inhale 3 L into the lungs.    [provider]  traZODone (DESYREL) 50 MG tablet Take 0.5-1 tablets (25-50 mg total) by mouth at bedtime as needed for sleep. 05/12/19   Lavera Guise, MD  Triamcinolone Acetonide (TRIAMCINOLONE 0.1 % CREAM : EUCERIN) CREA Apply 1 application topically 2 (two) times daily. 05/14/19   Lavera Guise, MD  triamcinolone ointment (KENALOG) 0.5 % Apply 1 application topically 2 (two) times daily. 05/28/19   Lavera Guise, MD  umeclidinium-vilanterol [facility] ELLIPTA) 62.5-25 MCG/INH AEPB Inhale 1 puff into the lungs daily. 04/30/19   Lavera Guise, MD      VITAL SIGNS:  Blood pressure (!) 150/91, pulse 91, temperature 98.5 F (  36.9 C), temperature source Oral, resp. rate 19, height 5\' 3"  (1.6 m), weight 122.5 kg, SpO2 93 %.  PHYSICAL EXAMINATION:  Physical Exam  GENERAL:  53 y.o.-year-old patient lying in the bed with no acute distress.  Morbid obesity EYES: Pupils equal, round, reactive to light and accommodation. No scleral icterus. Extraocular muscles intact.  HEENT: Head atraumatic, normocephalic. Oropharynx and nasopharynx clear.  NECK:  Supple, no jugular venous distention. No thyroid enlargement, no tenderness.  LUNGS: Normal breath sounds bilaterally, no wheezing, bilateral basilar rales, no rhonchi or crepitation. No use of accessory muscles of respiration.  CARDIOVASCULAR: S1, S2 normal. No murmurs, rubs, or gallops.  ABDOMEN: Soft, nontender, nondistended. Bowel sounds present. No organomegaly or mass.  EXTREMITIES: No pedal edema, cyanosis, or clubbing.  NEUROLOGIC: Cranial nerves II through XII are intact. Muscle strength 4/5 in all extremities. Sensation intact. Gait not checked.  PSYCHIATRIC: The patient is alert and oriented x 3.  SKIN: No obvious rash, lesion, or ulcer.   LABORATORY PANEL:   CBC Recent Labs  Lab 06/15/19 1348  WBC 12.8*  HGB 9.8*  HCT 32.7*  PLT 236    ------------------------------------------------------------------------------------------------------------------  Chemistries  Recent Labs  Lab 06/15/19 1348  NA 131*  K 7.2*  CL 97*  CO2 18*  GLUCOSE 262*  BUN 109*  CREATININE 16.23*  CALCIUM 7.1*  AST 12*  ALT 11  ALKPHOS 140*  BILITOT 0.6   ------------------------------------------------------------------------------------------------------------------  Cardiac Enzymes No results for input(s): TROPONINI in the last 168 hours. ------------------------------------------------------------------------------------------------------------------  RADIOLOGY:  Dg Chest Port 1 View  Result Date: 06/15/2019 CLINICAL DATA:  Shortness of breath. History of pulmonary hypertension. EXAM: PORTABLE CHEST 1 VIEW COMPARISON:  02/21/2019 FINDINGS: Cardiac enlargement and pulmonary vascular congestion. No pleural effusion or airspace opacities. Mild asymmetric elevation of right hemidiaphragm. IMPRESSION: Cardiac enlargement with pulmonary vascular congestion. Electronically Signed   By: Kerby Moors M.D.   On: 06/15/2019 14:06      IMPRESSION AND PLAN:   Acute renal failure on CKD stage III with hyperkalemia. The patient will be admitted to stepdown unit. He is treated with insulin, D50, calcium gluconate and Lokelma.  Follow-up kidney ultrasound and Dr. Holley Raring for possible hemodialysis.  Hold Lasix and metolazone.  Acute on chronic respiratory failure with hypoxia due to fluid overload and acute on chronic diastolic CHF. Hold Lasix and metolazone due to acute renal failure.  Follow-up with Dr. Holley Raring for emergent hemodialysis.  Hyponatremia.  Due to fluid overload.  Follow-up BMP.  Hypertension. Diabetes.  Sliding scale. OSA and morbid obesity. I discussed with ICU intensivist. All the records are reviewed and case discussed with ED provider. Management plans discussed with the patient, family and they are in agreement.  CODE  STATUS: Full code  TOTAL TIME TAKING CARE OF THIS PATIENT: 56 minutes.    Demetrios Loll M.D on 06/15/2019 at 3:51 PM  Between 7am to 6pm - Pager - [phone]  After 6pm go to [email or web address] - password [email or web address] [facility]  [employer] [facility] [facility]  Office  [phone]  CC: Primary care physician; Lavera Guise, MD   Note: This dictation was prepared with Dragon dictation along with smaller phrase technology. Any transcriptional errors that result from this process are unin

## 2019-06-15 NOTE — TOC Initial Note (Signed)
Transition of Care [facility]) - Initial/Assessment Note    Patient Details  Name: [name] MRN: [id number] Date of Birth: Nov 03, 1966  Transition of Care [facility]) CM/SW Contact:    Marshell Garfinkel, RN Phone Number: 06/15/2019, 1:54 PM  Clinical Narrative:                 Per pulmonology note patient has been arranged with home bipap and already has chronic O2 at home. Per Care Everywhere, patient appears to be followed by [facility] health care: Claudia Desanctis, MD (Oct. 03, 2018October 03, 2018 - Mar. 03, 2020March 03, 2020) [phone] (Work) [phone] (Fax) [address], [place] [address] Family Medicine [facility], [place] [address] Alda Lea, MD (Consulting Physician) [phone] (Work) [phone] (Fax) [place] [address], [place] [address] Cardiology [facility] [address] [place], [place] [address] Ree Edman, ANP (Nurse Practitioner) [phone] (Work) [phone] [facility]) [address] Medicine [id number] [place], [place] [address] Cardiology [facility] [address] [place], [place] [address] Rolene Course, MD (Consulting Physician) [phone] (Work) [phone] (Fax) [place] [id number] [place], [place] [address] Cardiology [facility] [address] [place], [place] [address]            Patient Goals and CMS Choice        Expected Discharge Plan and Services                                                Prior Living Arrangements/Services                       Activities of Daily Living      Permission Sought/Granted                  Emotional Assessment              Admission diagnosis:  weakness Patient Active Problem List   Diagnosis Date Noted  . Type 2 diabetes mellitus with hyperglycemia, with long-term current use of insulin ([place]) 03/05/2019  .  Hypoglycemia 02/18/2019  . Altered mental status 02/18/2019  . RLQ abdominal mass 01/20/2019  . CHF (congestive heart failure) ([place]) 10/07/2018  . Muscle cramps 10/07/2018  . Shortness of breath 09/08/2018  . Morbid obesity ([place]) 06/24/2018  . Acute-on-chronic kidney injury ([place]) 04/28/2018  . Acute on chronic respiratory failure with hypoxia and hypercapnia ([place]) 04/26/2018  . Morbid obesity with BMI of 50.0-59.9, adult ([place]) 04/23/2018  . Chronic diastolic heart failure ([place]) 02/10/2018  . Obstructive sleep apnea 02/10/2018  . Diabetes mellitus without complication ([place]) 63/78/5885  . CAD (coronary artery disease) 01/31/2017  . Hyperlipidemia, unspecified 01/31/2017  . Essential hypertension 01/17/2017  . Psoriasis 01/17/2017  . Chronic gout of multiple sites 12/21/2016  . DM type 2 with diabetic peripheral neuropathy ([place]) 06/25/2016  . Coronary artery disease involving native coronary artery 06/14/2015  . Cardiomyopathy ([place]) 08/19/2012  . CKD (chronic kidney disease) stage 3, GFR 30-59 ml/min (HCC) 08/19/2012   PCP:  Lavera Guise, MD Pharmacy:   [place], [place] - [address] [place] [address] Phone: [phone] Fax: [phone]     Social Determinants of Health (SDOH) Interventions  Readmission Risk Interventions No flowsheet data found.

## 2019-06-15 NOTE — ED Provider Notes (Signed)
Due to difficulty with obtaining IV access, a 20G peripheral IV catheter was inserted using US guidance into the RUE.  The site was prepped with chlorhexidine and allowed to dry.  The patient tolerated the procedure without any complications.    Merlyn Lot, MD 06/15/19 [phone]

## 2019-06-15 NOTE — ED Triage Notes (Signed)
Patient from home via [facility]. Patient reports worsening weakness, body aches and shortness of breath x2 weeks. States he has tried to avoid coming to doctor as long as possible. Patient denies fever at home. Denies cough. Patient with history of COPD. States he wears 3L [place] at baseline and is supposed to wear cpap at night and as needed during the day but states he does not use it like he is supposed to. Per EMS, patient was 75% on 3L upon their arrival to his home. Patient placed on 10L non-rebreather with increase to 100%.

## 2019-06-15 NOTE — ED Provider Notes (Addendum)
[facility] Emergency Department Provider Note   ____________________________________________    I have reviewed the triage vital signs and the nursing notes.   HISTORY  Chief Complaint Shortness of Breath and Weakness     HPI [name] is a 53 y.o. male who presents with complaints of shortness of breath, weakness.  Patient has extensive past medical history including CHF, CKD, diabetes, hypertension, sleep apnea, and COPD.  He reports that he typically wears 3 L nasal cannula 24/7, he is post to use CPAP at night but has not been using it.  Denies fevers but does describe some myalgias especially when standing.  No exposure to cover positive patients that he knows of.  No significant cough.  He notes that he is chronically short of breath but over the last 2 days it is worsened significantly  Past Medical History:  Diagnosis Date  . Bell's palsy   . CHF (congestive heart failure) ([place])   . Chronic kidney disease   . Diabetes mellitus without complication ([place])   . Hypertension   . NSVT (nonsustained ventricular tachycardia) ([place])   . Obstructive sleep apnea   . Psoriasis   . Pulmonary HTN [facility])     Patient Active Problem List   Diagnosis Date Noted  . Type 2 diabetes mellitus with hyperglycemia, with long-term current use of insulin ([place]) 03/05/2019  . Hypoglycemia 02/18/2019  . Altered mental status 02/18/2019  . RLQ abdominal mass 01/20/2019  . CHF (congestive heart failure) ([place]) 10/07/2018  . Muscle cramps 10/07/2018  . Shortness of breath 09/08/2018  . Morbid obesity ([place]) 06/24/2018  . Acute-on-chronic kidney injury ([place]) 04/28/2018  . Acute on chronic respiratory failure with hypoxia and hypercapnia ([place]) 04/26/2018  . Morbid obesity with BMI of 50.0-59.9, adult ([place]) 04/23/2018  . Chronic diastolic heart failure ([place]) 02/10/2018  . Obstructive sleep apnea 02/10/2018  . Diabetes mellitus without complication ([place])  48/54/6270  . CAD (coronary artery disease) 01/31/2017  . Hyperlipidemia, unspecified 01/31/2017  . Essential hypertension 01/17/2017  . Psoriasis 01/17/2017  . Chronic gout of multiple sites 12/21/2016  . DM type 2 with diabetic peripheral neuropathy ([place]) 06/25/2016  . Coronary artery disease involving native coronary artery 06/14/2015  . Cardiomyopathy ([place]) 08/19/2012  . CKD (chronic kidney disease) stage 3, GFR 30-59 ml/min (HCC) 08/19/2012    Past Surgical History:  Procedure Laterality Date  . CORONARY ANGIOPLASTY WITH STENT PLACEMENT    . NO PAST SURGERIES      Prior to Admission medications   Medication Sig Start Date End Date Taking? Authorizing Provider  Accu-Chek FastClix Lancets MISC USE TO CHECK BLOOD SUGAR TWICE DAILY DX E11.65 04/21/19   Lavera Guise, MD  albuterol (PROVENTIL HFA;VENTOLIN HFA) 108 (90 Base) MCG/ACT inhaler Inhale 2 puffs into the lungs every 4 (four) hours as needed for wheezing or shortness of breath. 01/20/19   Doles-Johnson, Teah, NP  allopurinol (ZYLOPRIM) 300 MG tablet Take 1 tablet (300 mg total) by mouth daily. 04/30/19   Lavera Guise, MD  amLODipine (NORVASC) 5 MG tablet Take by mouth. 04/03/18   [provider]  aspirin EC 81 MG tablet Take 1 tablet (81 mg total) by mouth daily. 06/02/19   Lavera Guise, MD  atorvastatin (LIPITOR) 40 MG tablet Take 1 tablet (40 mg total) by mouth at bedtime. 04/30/19   Lavera Guise, MD  carvedilol (COREG) 25 MG tablet Take 1 tablet (25 mg total) by mouth every 12 (twelve) hours.  04/30/19   Lavera Guise, MD  Cholecalciferol (D3-1000) 25 MCG (1000 UT) tablet Take 2 tablets (2,000 Units total) by mouth daily. 01/20/19   Doles-Johnson, Teah, NP  cyclobenzaprine (FLEXERIL) 5 MG tablet TAKE ONE TABLET BY MOUTH AT BEDTIME AS NEEDED FOR MUSCLE SPASMS 02/27/19   Doles-Johnson, Teah, NP  Dulaglutide (TRULICITY) 1.5 IR/6.7EL SOPN Use as directed once a week diag e11.65 05/18/19   Lavera Guise, MD  furosemide (LASIX) 40 MG  tablet Take 1.5 tablets (60 mg total) by mouth 2 (two) times daily. 04/30/19   Lavera Guise, MD  gabapentin (NEURONTIN) 600 MG tablet Take 1 tablet (600 mg total) by mouth 3 (three) times daily. 04/30/19   Lavera Guise, MD  glucose blood (ACCU-CHEK GUIDE) test strip Use as instructed TWICE DAILY DX E11.65 04/21/19   Lavera Guise, MD  hydrALAZINE (APRESOLINE) 25 MG tablet Take one tab po bid for blood pressure 05/12/19   Lavera Guise, MD  insulin degludec [facility]) 100 UNIT/ML SOPN FlexTouch Pen Inject 12- 16 units at bed time 05/14/19   Lavera Guise, MD  isosorbide mononitrate (IMDUR) 60 MG 24 hr tablet Take 1 tablet (60 mg total) by mouth daily. 04/30/19   Lavera Guise, MD  magnesium oxide (MAG-OX) 400 MG tablet Take 1 tablet (400 mg total) by mouth daily. 04/30/19   Lavera Guise, MD  metolazone (ZAROXOLYN) 2.5 MG tablet Take 1 tablet (2.5 mg total) by mouth as needed. Patient taking differently: Take 2.5 mg by mouth daily as needed (bloating or swelling).  01/20/19   Doles-Johnson, Teah, NP  omeprazole (PRILOSEC) 20 MG capsule Take 1 capsule (20 mg total) by mouth 2 (two) times daily before a meal. 04/30/19   Lavera Guise, MD  OXYGEN Inhale 3 L into the lungs.    [provider]  traZODone (DESYREL) 50 MG tablet Take 0.5-1 tablets (25-50 mg total) by mouth at bedtime as needed for sleep. 05/12/19   Lavera Guise, MD  Triamcinolone Acetonide (TRIAMCINOLONE 0.1 % CREAM : EUCERIN) CREA Apply 1 application topically 2 (two) times daily. 05/14/19   Lavera Guise, MD  triamcinolone ointment (KENALOG) 0.5 % Apply 1 application topically 2 (two) times daily. 05/28/19   Lavera Guise, MD  umeclidinium-vilanterol [facility] ELLIPTA) 62.5-25 MCG/INH AEPB Inhale 1 puff into the lungs daily. 04/30/19   Lavera Guise, MD     Allergies Shellfish allergy  Family History  Problem Relation Age of Onset  . Heart failure Mother   . Kidney failure Brother     Social History Social History   Tobacco  Use  . Smoking status: Never Smoker  . Smokeless tobacco: Never Used  Substance Use Topics  . Alcohol use: Yes    Alcohol/week: 2.0 standard drinks    Types: 1 Shots of liquor, 1 Cans of beer per week    Comment: very rarely 1-2 times per month  . Drug use: No    Review of Systems  Constitutional: No fever/chills Eyes: No visual changes.  ENT: No sore throat. Cardiovascular: Denies chest pain. Respiratory: As above Gastrointestinal: No abdominal pain.  No nausea, no vomiting.   Genitourinary: Negative for dysuria. Musculoskeletal: Negative for back pain. Skin: Negative for rash. Neurological: Negative for headaches    ____________________________________________   PHYSICAL EXAM:  VITAL SIGNS: ED Triage Vitals  Enc Vitals Group     BP 06/15/19 1318 (!) 150/91     Pulse Rate 06/15/19 1318 91  Resp 06/15/19 1318 19     Temp 06/15/19 1318 98.5 F (36.9 C)     Temp Source 06/15/19 1318 Oral     SpO2 06/15/19 1318 93 %     Weight 06/15/19 1319 122.5 kg (270 lb)     Height 06/15/19 1319 1.6 m (5\' 3" )     Head Circumference --      Peak Flow --      Pain Score 06/15/19 1318 6     Pain Loc --      Pain Edu? --      Excl. in [place]? --     Constitutional: Alert and oriented.   Nose: No congestion/rhinnorhea. Mouth/Throat: Mucous membranes are moist.    Cardiovascular: Normal rate, regular rhythm. Grossly normal heart sounds.  Good peripheral circulation. Respiratory: Increased respiratory effort, scattered wheezes, bibasilar rales Gastrointestinal: Soft and nontender. No distention.  No CVA tenderness.  Musculoskeletal: 2+ edema bilaterally, chronic skin changes warm and well perfused Neurologic:  Normal speech and language. No gross focal neurologic deficits are appreciated.  Skin:  Skin is warm, dry and intact. No rash noted. Psychiatric: Mood and affect are normal. Speech and behavior are normal.  ____________________________________________   LABS (all labs  ordered are listed, but only abnormal results are displayed)  Labs Reviewed  CBC WITH DIFFERENTIAL/PLATELET - Abnormal; Notable for the following components:      Result Value   WBC 12.8 (*)    RBC 4.17 (*)    Hemoglobin 9.8 (*)    HCT 32.7 (*)    MCV 78.4 (*)    MCH 23.5 (*)    RDW 18.7 (*)    Neutro Abs 9.8 (*)    Abs Immature Granulocytes 0.09 (*)    All other components within normal limits  COMPREHENSIVE METABOLIC PANEL - Abnormal; Notable for the following components:   Sodium 131 (*)    Potassium 7.2 (*)    Chloride 97 (*)    CO2 18 (*)    Glucose, Bld 262 (*)    BUN 109 (*)    Creatinine, Ser 16.23 (*)    Calcium 7.1 (*)    Albumin 2.4 (*)    AST 12 (*)    Alkaline Phosphatase 140 (*)    GFR calc non Af Amer 3 (*)    GFR calc Af Amer 3 (*)    Anion gap 16 (*)    All other components within normal limits  TROPONIN I (HIGH SENSITIVITY) - Abnormal; Notable for the following components:   Troponin I (High Sensitivity) 24 (*)    All other components within normal limits  BRAIN NATRIURETIC PEPTIDE - Abnormal; Notable for the following components:   B Natriuretic Peptide 173.0 (*)    All other components within normal limits  SARS CORONAVIRUS 2 (HOSPITAL ORDER, [facility])  CULTURE, BLOOD (ROUTINE X 2)  CULTURE, BLOOD (ROUTINE X 2)  LACTIC ACID, PLASMA  LACTIC ACID, PLASMA   ____________________________________________  EKG  ED ECG REPORT I, [name], the attending physician, personally viewed and interpreted this ECG.  Date: 06/15/2019  Rhythm: normal sinus rhythm QRS Axis: normal Intervals: normal ST/T Wave abnormalities: normal Narrative Interpretation: no evidence of acute ischemia  ____________________________________________  RADIOLOGY  Chest x-ray demonstrates pulmonary vascular congestion ____________________________________________   PROCEDURES  Procedure(s) performed: No  Procedures   Critical Care  performed: yes  CRITICAL CARE Performed by: [name]   Total critical care time: 25minutes  Critical care time  was exclusive of separately billable procedures and treating other patients.  Critical care was necessary to treat or prevent imminent or life-threatening deterioration.  Critical care was time spent personally by me on the following activities: development of treatment plan with patient and/or surrogate as well as nursing, discussions with consultants, evaluation of patient's response to treatment, examination of patient, obtaining history from patient or surrogate, ordering and performing treatments and interventions, ordering and review of laboratory studies, ordering and review of radiographic studies, pulse oximetry and re-evaluation of patient's condition.  ____________________________________________   INITIAL IMPRESSION / ASSESSMENT AND PLAN / ED COURSE  Pertinent labs & imaging results that were available during my care of the patient were reviewed by me and considered in my medical decision making (see chart for details).  Patient reportedly satting in the 70s on 3 L nasal cannula at home, started on nonrebreather by EMS, here in the emergency department we have been able to put him on 5 L nasal cannula to maintain saturations above 90%.  Presentation appears suspicious for CHF, possibly with a component of COPD.  Certainly QPYPP-50 is on the differential given his myalgias although afebrile here which is reassuring.  Chest x-ray demonstrates vascular congestion  Mildly elevated white blood cell count is nonspecific.  Pending COVID test   ----------------------------------------- 3:20 PM on 06/15/2019 -----------------------------------------  Notified of elevated potassium by nurse, review of BMP demonstrates remarkably elevated BUN and creatinine of 109/16.  Discussed with Dr. Zollie Scale of nephrology who recommends insulin, glucose, calcium, lokelma he will see  the patient. Have discussed with Dr. Estanislado Pandy for admission   ____________________________________________   FINAL CLINICAL IMPRESSION(S) / ED DIAGNOSES  Final diagnoses:  Acute on chronic respiratory failure with hypoxia ([place])  Acute renal failure, unspecified acute renal failure type [facility]        Note:  This document was prepared using Dragon voice recognition software and may include unintentional dictation errors.   [name], MD 06/15/19 [id number]    [name], MD 06/15/19 [phone]

## 2019-06-16 ENCOUNTER — Inpatient Hospital Stay: Payer: Medicare Other

## 2019-06-16 ENCOUNTER — Encounter: Payer: Medicare Other | Admitting: Internal Medicine

## 2019-06-16 ENCOUNTER — Inpatient Hospital Stay
Admit: 2019-06-16 | Discharge: 2019-06-16 | Disposition: A | Discharge status: Home / Self Care | Payer: Medicare Other | Attending: Internal Medicine | Admitting: Internal Medicine

## 2019-06-16 LAB — RENAL FUNCTION PANEL
Albumin: 2.3 g/dL — ABNORMAL LOW (ref 3.5–5.0)
Albumin: 2.3 g/dL — ABNORMAL LOW (ref 3.5–5.0)
Albumin: 2.3 g/dL — ABNORMAL LOW (ref 3.5–5.0)
Albumin: 2.2 g/dL — ABNORMAL LOW (ref 3.5–5.0)
Albumin: 2.2 g/dL — ABNORMAL LOW (ref 3.5–5.0)
Albumin: 2.3 g/dL — ABNORMAL LOW (ref 3.5–5.0)
Anion gap: 11 (ref 5–15)
Anion gap: 9 (ref 5–15)
Anion gap: 11 (ref 5–15)
Anion gap: 11 (ref 5–15)
Anion gap: 9 (ref 5–15)
Anion gap: 11 (ref 5–15)
BUN: 94 mg/dL — ABNORMAL HIGH (ref 6–20)
BUN: 88 mg/dL — ABNORMAL HIGH (ref 6–20)
BUN: 80 mg/dL — ABNORMAL HIGH (ref 6–20)
BUN: 71 mg/dL — ABNORMAL HIGH (ref 6–20)
BUN: 59 mg/dL — ABNORMAL HIGH (ref 6–20)
BUN: 57 mg/dL — ABNORMAL HIGH (ref 6–20)
CO2: 24 mmol/L (ref 22–32)
CO2: 26 mmol/L (ref 22–32)
CO2: 25 mmol/L (ref 22–32)
CO2: 26 mmol/L (ref 22–32)
CO2: 27 mmol/L (ref 22–32)
CO2: 25 mmol/L (ref 22–32)
Calcium: 7.2 mg/dL — ABNORMAL LOW (ref 8.9–10.3)
Calcium: 7.5 mg/dL — ABNORMAL LOW (ref 8.9–10.3)
Calcium: 7.7 mg/dL — ABNORMAL LOW (ref 8.9–10.3)
Calcium: 7.7 mg/dL — ABNORMAL LOW (ref 8.9–10.3)
Calcium: 8.1 mg/dL — ABNORMAL LOW (ref 8.9–10.3)
Calcium: 7.9 mg/dL — ABNORMAL LOW (ref 8.9–10.3)
Chloride: 98 mmol/L (ref 98–111)
Chloride: 99 mmol/L (ref 98–111)
Chloride: 97 mmol/L — ABNORMAL LOW (ref 98–111)
Chloride: 96 mmol/L — ABNORMAL LOW (ref 98–111)
Chloride: 98 mmol/L (ref 98–111)
Chloride: 98 mmol/L (ref 98–111)
Creatinine, Ser: 13.55 mg/dL — ABNORMAL HIGH (ref 0.61–1.24)
Creatinine, Ser: 12.48 mg/dL — ABNORMAL HIGH (ref 0.61–1.24)
Creatinine, Ser: 10.75 mg/dL — ABNORMAL HIGH (ref 0.61–1.24)
Creatinine, Ser: 9.39 mg/dL — ABNORMAL HIGH (ref 0.61–1.24)
Creatinine, Ser: 9.12 mg/dL — ABNORMAL HIGH (ref 0.61–1.24)
Creatinine, Ser: 8.3 mg/dL — ABNORMAL HIGH (ref 0.61–1.24)
GFR calc Af Amer: 4 mL/min — ABNORMAL LOW (ref >60)
GFR calc Af Amer: 5 mL/min — ABNORMAL LOW (ref >60)
GFR calc Af Amer: 6 mL/min — ABNORMAL LOW (ref >60)
GFR calc Af Amer: 7 mL/min — ABNORMAL LOW (ref >60)
GFR calc Af Amer: 7 mL/min — ABNORMAL LOW (ref >60)
GFR calc Af Amer: 8 mL/min — ABNORMAL LOW (ref >60)
GFR calc non Af Amer: 4 mL/min — ABNORMAL LOW (ref >60)
GFR calc non Af Amer: 4 mL/min — ABNORMAL LOW (ref >60)
GFR calc non Af Amer: 5 mL/min — ABNORMAL LOW (ref >60)
GFR calc non Af Amer: 6 mL/min — ABNORMAL LOW (ref >60)
GFR calc non Af Amer: 6 mL/min — ABNORMAL LOW (ref >60)
GFR calc non Af Amer: 7 mL/min — ABNORMAL LOW (ref >60)
Glucose, Bld: 220 mg/dL — ABNORMAL HIGH (ref 70–99)
Glucose, Bld: 253 mg/dL — ABNORMAL HIGH (ref 70–99)
Glucose, Bld: 319 mg/dL — ABNORMAL HIGH (ref 70–99)
Glucose, Bld: 300 mg/dL — ABNORMAL HIGH (ref 70–99)
Glucose, Bld: 164 mg/dL — ABNORMAL HIGH (ref 70–99)
Glucose, Bld: 126 mg/dL — ABNORMAL HIGH (ref 70–99)
Phosphorus: 9.7 mg/dL — ABNORMAL HIGH (ref 2.5–4.6)
Phosphorus: 8.5 mg/dL — ABNORMAL HIGH (ref 2.5–4.6)
Phosphorus: 7.6 mg/dL — ABNORMAL HIGH (ref 2.5–4.6)
Phosphorus: 7 mg/dL — ABNORMAL HIGH (ref 2.5–4.6)
Phosphorus: 7.2 mg/dL — ABNORMAL HIGH (ref 2.5–4.6)
Phosphorus: 6.6 mg/dL — ABNORMAL HIGH (ref 2.5–4.6)
Potassium: 6.2 mmol/L — ABNORMAL HIGH (ref 3.5–5.1)
Potassium: 4.8 mmol/L (ref 3.5–5.1)
Potassium: 4.5 mmol/L (ref 3.5–5.1)
Potassium: 4.8 mmol/L (ref 3.5–5.1)
Potassium: 5.1 mmol/L (ref 3.5–5.1)
Potassium: 5.4 mmol/L — ABNORMAL HIGH (ref 3.5–5.1)
Sodium: 133 mmol/L — ABNORMAL LOW (ref 135–145)
Sodium: 134 mmol/L — ABNORMAL LOW (ref 135–145)
Sodium: 133 mmol/L — ABNORMAL LOW (ref 135–145)
Sodium: 133 mmol/L — ABNORMAL LOW (ref 135–145)
Sodium: 134 mmol/L — ABNORMAL LOW (ref 135–145)
Sodium: 134 mmol/L — ABNORMAL LOW (ref 135–145)

## 2019-06-16 LAB — ECHOCARDIOGRAM COMPLETE
Height: 63 in
Weight: 4320 oz

## 2019-06-16 LAB — CBC
HCT: 30 % — ABNORMAL LOW (ref 39.0–52.0)
Hemoglobin: 9.1 g/dL — ABNORMAL LOW (ref 13.0–17.0)
MCH: 23.5 pg — ABNORMAL LOW (ref 26.0–34.0)
MCHC: 30.3 g/dL (ref 30.0–36.0)
MCV: 77.3 fL — ABNORMAL LOW (ref 80.0–100.0)
Platelets: 233 10*3/uL (ref 150–400)
RBC: 3.88 MIL/uL — ABNORMAL LOW (ref 4.22–5.81)
RDW: 18.6 % — ABNORMAL HIGH (ref 11.5–15.5)
WBC: 12.8 10*3/uL — ABNORMAL HIGH (ref 4.0–10.5)
nRBC: 0 % (ref 0.0–0.2)

## 2019-06-16 LAB — MAGNESIUM
Magnesium: 1.9 mg/dL (ref 1.7–2.4)
Magnesium: 1.8 mg/dL (ref 1.7–2.4)
Magnesium: 1.9 mg/dL (ref 1.7–2.4)
Magnesium: 1.8 mg/dL (ref 1.7–2.4)
Magnesium: 1.9 mg/dL (ref 1.7–2.4)
Magnesium: 1.8 mg/dL (ref 1.7–2.4)

## 2019-06-16 LAB — GLUCOSE, CAPILLARY
Glucose-Capillary: 179 mg/dL — ABNORMAL HIGH (ref 70–99)
Glucose-Capillary: 296 mg/dL — ABNORMAL HIGH (ref 70–99)
Glucose-Capillary: 304 mg/dL — ABNORMAL HIGH (ref 70–99)
Glucose-Capillary: 216 mg/dL — ABNORMAL HIGH (ref 70–99)
Glucose-Capillary: 133 mg/dL — ABNORMAL HIGH (ref 70–99)

## 2019-06-16 LAB — APTT: aPTT: 42 seconds — ABNORMAL HIGH (ref 24–36)

## 2019-06-16 MED ORDER — INSULIN ASPART 100 UNIT/ML ~~LOC~~ SOLN
0.0000 [IU] | Freq: Three times a day (TID) | SUBCUTANEOUS | Status: DC
  Administered 2019-06-16: 11 [IU] via SUBCUTANEOUS
  Administered 2019-06-16: 5 [IU] via SUBCUTANEOUS
  Administered 2019-06-17 – 2019-06-20 (×3): 2 [IU] via SUBCUTANEOUS
  Administered 2019-06-20: 3 [IU] via SUBCUTANEOUS
  Administered 2019-06-21: 2 [IU] via SUBCUTANEOUS
  Administered 2019-06-21 – 2019-06-22 (×2): 3 [IU] via SUBCUTANEOUS
  Administered 2019-06-22: 2 [IU] via SUBCUTANEOUS
  Administered 2019-06-23: 3 [IU] via SUBCUTANEOUS
  Administered 2019-06-23: 2 [IU] via SUBCUTANEOUS
  Administered 2019-06-23 – 2019-06-24 (×2): 3 [IU] via SUBCUTANEOUS
  Administered 2019-06-24: 5 [IU] via SUBCUTANEOUS
  Administered 2019-06-24: 2 [IU] via SUBCUTANEOUS
  Administered 2019-06-25: 5 [IU] via SUBCUTANEOUS
  Administered 2019-06-25: 3 [IU] via SUBCUTANEOUS
  Filled 2019-06-16 (×17): qty 1

## 2019-06-16 MED ORDER — HYDROCERIN EX CREA
TOPICAL_CREAM | Freq: Three times a day (TID) | CUTANEOUS | Status: DC
  Administered 2019-06-16: 1 via TOPICAL
  Administered 2019-06-16 – 2019-06-22 (×16): via TOPICAL
  Administered 2019-06-23: 1 via TOPICAL
  Administered 2019-06-23 – 2019-06-25 (×3): via TOPICAL
  Filled 2019-06-16 (×2): qty 113

## 2019-06-16 MED ORDER — INSULIN ASPART 100 UNIT/ML ~~LOC~~ SOLN
0.0000 [IU] | Freq: Every day | SUBCUTANEOUS | Status: DC
  Administered 2019-06-23 – 2019-06-24 (×2): 2 [IU] via SUBCUTANEOUS
  Filled 2019-06-16 (×2): qty 1

## 2019-06-16 MED ORDER — PUREFLOW DIALYSIS SOLUTION
INTRAVENOUS | Status: DC
  Administered 2019-06-16 – 2019-06-19 (×11): via INTRAVENOUS_CENTRAL

## 2019-06-16 MED ORDER — INSULIN GLARGINE 100 UNIT/ML ~~LOC~~ SOLN
20.0000 [IU] | Freq: Every day | SUBCUTANEOUS | Status: DC
  Administered 2019-06-16 – 2019-06-18 (×3): 20 [IU] via SUBCUTANEOUS
  Filled 2019-06-16 (×4): qty 0.2

## 2019-06-16 NOTE — Progress Notes (Signed)
*  PRELIMINARY RESULTS* Echocardiogram 2D Echocardiogram has been performed.  [name] 06/16/2019, 9:31 AM

## 2019-06-16 NOTE — Progress Notes (Signed)
Inpatient Diabetes Program Recommendations  AACE/ADA: New Consensus Statement on Inpatient Glycemic Control   Target Ranges:  Prepandial:   less than 140 mg/dL      Peak postprandial:   less than 180 mg/dL (1-2 hours)      Critically ill patients:  140 - 180 mg/dL  Results for [name], [name] (MRN [id number]) as of 06/16/2019 10:52  Ref. Range 06/15/2019 18:00 06/16/2019 07:37  Glucose-Capillary Latest Ref Range: 70 - 99 mg/dL 179 (H) 296 (H)   Results for [name], [name] (MRN [id number]) as of 06/16/2019 10:52  Ref. Range 04/21/2019 11:23  Hemoglobin A1C Latest Ref Range: 4.0 - 5.6 % 7.7 (A)   Review of Glycemic Control  Diabetes history: DM2 Outpatient Diabetes medications: Trulicity 0.5 mg Qweek on Thursday, Tresiba 12-16 units QHS Current orders for Inpatient glycemic control: Novolog 0-9 units TID with meals  Inpatient Diabetes Program Recommendations:   Insulin - Basal: If glucose continues to be consistently greater than 180 mg/dl, please consider ordering Levemir 10 units Q24H.  Insulin-Correction: Please consider ordering Novolog 0-5 units QHS.   HgbA1C: A1C 7.7% on 04/21/19.  NOTE: In reviewing chart, noted patient was inpatient 02/18/19 to 02/24/19 and inpatient diabetes coordinator talked with patient on 02/19/19. At time of discharge on 02/24/19 patient was told to stop Lantus (was taking 90 units BID), decrease Humulin R to 10 units TID (was taking 50 units TID), and continue Trulicity 0.5 mg once a week. Per home medication list, patient is now taking Trulicity 0.5 mg Qweek on Thursday and Tresiba 12-16 units QHS.  Thanks, Barnie Alderman, RN, MSN, CDE Diabetes Coordinator Inpatient Diabetes Program [phone] (Team Pager from 8am to 5pm)

## 2019-06-16 NOTE — Progress Notes (Signed)
CRRT initiated as ordered

## 2019-06-16 NOTE — Progress Notes (Signed)
Per Dr Holley Raring ordered to change CRRT fluids to 4k bath. Notified MD that patients has not had any urine output. Holding BP meds due to CRRT.

## 2019-06-16 NOTE — Consult Note (Signed)
[facility] CONSULT NOTE    Date: 06/16/2019                  Patient Name:  [name]  MRN: [id number]  DOB: 1966-03-13  Age / Sex: 53 y.o., male         PCP: Lavera Guise, MD                 Service Requesting Consult: Hospitalist                 Reason for Consult: Acute renal failure, hyperkalemia            History of Present Illness: Patient is a 53 y.o. male with a PMHx of Bell's palsy, congestive heart failure, diabetes mellitus type 2, hypertension, obstructive sleep apnea, psoriasis, pulmonary hypertension, chronic kidney disease stage III, who was admitted to [facility] on 06/15/2019 for evaluation of severe acute renal failure in the setting of generalized weakness.  Patient quite lethargic at time of interview therefore he was unable to offer any significant history.  Case was discussed in depth with critical care late yesterday.  Decision was made to start the patient on renal replacement therapy.  His hyperkalemia has significantly improved with CRRT.  Patient continues to have no urine output.  No obstruction was noted on renal ultrasound performed yesterday.   Medications: Outpatient medications: Medications Prior to Admission  Medication Sig Dispense Refill Last Dose  . albuterol (PROVENTIL HFA;VENTOLIN HFA) 108 (90 Base) MCG/ACT inhaler Inhale 2 puffs into the lungs every 4 (four) hours as needed for wheezing or shortness of breath. 6.7 g 5 prn at prn  . allopurinol (ZYLOPRIM) 300 MG tablet Take 1 tablet (300 mg total) by mouth daily. 90 tablet 4 06/14/2019 at Unknown time  . amLODipine (NORVASC) 5 MG tablet Take 5 mg by mouth daily.    06/14/2019 at Unknown time  . aspirin EC 81 MG tablet Take 1 tablet (81 mg total) by mouth daily. 90 tablet 1 06/14/2019 at Unknown time  . atorvastatin (LIPITOR) 40 MG tablet Take 1 tablet (40 mg total) by mouth at bedtime. 90 tablet 3 06/14/2019 at Unknown time  . carvedilol (COREG) 25 MG tablet Take 1 tablet  (25 mg total) by mouth every 12 (twelve) hours. 180 tablet 3 06/14/2019 at Unknown time  . Cholecalciferol (D3-1000) 25 MCG (1000 UT) tablet Take 2 tablets (2,000 Units total) by mouth daily. 60 tablet 3 06/14/2019 at Unknown time  . cyclobenzaprine (FLEXERIL) 5 MG tablet TAKE ONE TABLET BY MOUTH AT BEDTIME AS NEEDED FOR MUSCLE SPASMS 30 tablet 0 prn at prn  . Dulaglutide (TRULICITY) 1.5 QM/5.7QI SOPN Use as directed once a week diag e11.65 (Patient taking differently: Use as directed once a week diag e11.65 on thursdays) 4 pen 1 Past Week at Unknown time  . furosemide (LASIX) 40 MG tablet Take 1.5 tablets (60 mg total) by mouth 2 (two) times daily. 90 tablet 3 06/14/2019 at Unknown time  . gabapentin (NEURONTIN) 600 MG tablet Take 1 tablet (600 mg total) by mouth 3 (three) times daily. 270 tablet 1 06/14/2019 at Unknown time  . hydrALAZINE (APRESOLINE) 25 MG tablet Take one tab po bid for blood pressure 180 tablet 3 06/14/2019 at Unknown time  . insulin degludec (TRESIBA FLEXTOUCH) 100 UNIT/ML SOPN FlexTouch Pen Inject 12- 16 units at bed time 3 pen 6 06/14/2019 at Unknown time  . isosorbide mononitrate (IMDUR) 60 MG 24 hr tablet Take 1  tablet (60 mg total) by mouth daily. 30 tablet 3 06/14/2019 at Unknown time  . magnesium oxide (MAG-OX) 400 MG tablet Take 1 tablet (400 mg total) by mouth daily. 30 tablet 3 06/14/2019 at Unknown time  . metolazone (ZAROXOLYN) 2.5 MG tablet Take 1 tablet (2.5 mg total) by mouth as needed. (Patient taking differently: Take 2.5 mg by mouth daily as needed (bloating or swelling). ) 20 tablet 2 prn at prn  . omeprazole (PRILOSEC) 20 MG capsule Take 1 capsule (20 mg total) by mouth 2 (two) times daily before a meal. 180 capsule 2 06/14/2019 at Unknown time  . traZODone (DESYREL) 50 MG tablet Take 0.5-1 tablets (25-50 mg total) by mouth at bedtime as needed for sleep. 30 tablet 3 prn at prn  . Triamcinolone Acetonide (TRIAMCINOLONE 0.1 % CREAM : EUCERIN) CREA Apply 1 application  topically 2 (two) times daily. 1 each 2 06/14/2019 at Unknown time  . umeclidinium-vilanterol (ANORO ELLIPTA) 62.5-25 MCG/INH AEPB Inhale 1 puff into the lungs daily. 1 each 5 06/14/2019 at Unknown time    Current medications: Current Facility-Administered Medications  Medication Dose Route Frequency Provider Last Rate Last Dose  . 0.9 %  sodium chloride infusion  250 mL Intravenous PRN Demetrios Loll, MD      . acetaminophen (TYLENOL) tablet 650 mg  650 mg Oral Q6H PRN Demetrios Loll, MD       Or  . acetaminophen (TYLENOL) suppository 650 mg  650 mg Rectal Q6H PRN Demetrios Loll, MD      . albuterol (VENTOLIN HFA) 108 (90 Base) MCG/ACT inhaler 2 puff  2 puff Inhalation Q4H PRN Demetrios Loll, MD      . amLODipine (NORVASC) tablet 5 mg  5 mg Oral Daily Demetrios Loll, MD   5 mg at 06/15/19 1856  . atorvastatin (LIPITOR) tablet 40 mg  40 mg Oral QHS Demetrios Loll, MD   Stopped at 06/15/19 2220  . bisacodyl (DULCOLAX) EC tablet 5 mg  5 mg Oral Daily PRN Demetrios Loll, MD      . carvedilol (COREG) tablet 25 mg  25 mg Oral Q12H Demetrios Loll, MD   Stopped at 06/15/19 2220  . cholecalciferol (VITAMIN D3) tablet 2,000 Units  2,000 Units Oral Daily Charlett Nose, RPH   2,000 Units at 06/16/19 0938  . heparin injection 1,000-6,000 Units  1,000-6,000 Units CRRT PRN Awilda Bill, NP      . heparin injection 5,000 Units  5,000 Units Subcutaneous Q8H Demetrios Loll, MD   5,000 Units at 06/16/19 1312  . hydrALAZINE (APRESOLINE) tablet 25 mg  25 mg Oral Q8H Demetrios Loll, MD   Stopped at 06/15/19 2220  . hydrocerin (EUCERIN) cream   Topical TID Ottie Glazier, MD   1 application at 18/29/93 [phone]  . HYDROcodone-acetaminophen (NORCO/VICODIN) 5-325 MG per tablet 1-2 tablet  1-2 tablet Oral Q4H PRN Demetrios Loll, MD   1 tablet at 06/16/19 [phone]  . insulin aspart (novoLOG) injection 0-15 Units  0-15 Units Subcutaneous TID WC Ottie Glazier, MD   11 Units at 06/16/19 1240  . insulin aspart (novoLOG) injection 0-5 Units  0-5 Units Subcutaneous  QHS Aleskerov, Fuad, MD      . insulin glargine (LANTUS) injection 20 Units  20 Units Subcutaneous Daily Ottie Glazier, MD   20 Units at 06/16/19 1310  . isosorbide mononitrate (IMDUR) 24 hr tablet 60 mg  60 mg Oral Daily Demetrios Loll, MD   60 mg at 06/15/19 1857  . MEDLINE mouth rinse  15 mL Mouth Rinse BID Ottie Glazier, MD   15 mL at 06/16/19 0957  . ondansetron (ZOFRAN) tablet 4 mg  4 mg Oral Q6H PRN Demetrios Loll, MD       Or  . ondansetron [facility]) injection 4 mg  4 mg Intravenous Q6H PRN Demetrios Loll, MD      . pureflow IV solution for Dialysis   CRRT Continuous Awilda Bill, NP 2,000 mL/hr at 06/16/19 0605    . senna-docusate (Senokot-S) tablet 1 tablet  1 tablet Oral QHS PRN Demetrios Loll, MD      . sodium chloride flush (NS) 0.9 % injection 3 mL  3 mL Intravenous Q12H Demetrios Loll, MD   3 mL at 06/16/19 0953  . sodium chloride flush (NS) 0.9 % injection 3 mL  3 mL Intravenous PRN Demetrios Loll, MD      . traZODone (DESYREL) tablet 25-50 mg  25-50 mg Oral QHS PRN Demetrios Loll, MD      . umeclidinium-vilanterol [facility] ELLIPTA) 62.5-25 MCG/INH 1 puff  1 puff Inhalation Daily Demetrios Loll, MD   1 puff at 06/16/19 4656      Allergies: Allergies  Allergen Reactions  . Shellfish Allergy Anaphylaxis    Face and throat swelling, difficulty breathing Allergy can be triggered by touching (contact)      Past Medical History: Past Medical History:  Diagnosis Date  . Bell's palsy   . CHF (congestive heart failure) ([place])   . Chronic kidney disease   . Diabetes mellitus without complication ([place])   . Hypertension   . NSVT (nonsustained ventricular tachycardia) ([place])   . Obstructive sleep apnea   . Psoriasis   . Pulmonary HTN ([place])      Past Surgical History: Past Surgical History:  Procedure Laterality Date  . CORONARY ANGIOPLASTY WITH STENT PLACEMENT    . NO PAST SURGERIES       Family History: Family History  Problem Relation Age of Onset  . Heart failure Mother   . Kidney  failure Brother      Social History: Social History   Socioeconomic History  . Marital status: Single    Spouse name: Not on file  . Number of children: Not on file  . Years of education: Not on file  . Highest education level: Not on file  Occupational History  . Not on file  Social Needs  . Financial resource strain: Somewhat hard  . Food insecurity    Worry: Often true    Inability: Often true  . Transportation needs    Medical: Yes    Non-medical: Yes  Tobacco Use  . Smoking status: Never Smoker  . Smokeless tobacco: Never Used  Substance and Sexual Activity  . Alcohol use: Yes    Alcohol/week: 2.0 standard drinks    Types: 1 Shots of liquor, 1 Cans of beer per week    Comment: very rarely 1-2 times per month  . Drug use: No  . Sexual activity: Not on file  Lifestyle  . Physical activity    Days per week: 0 days    Minutes per session: 0 min  . Stress: Rather much  Relationships  . Social Herbalist on phone: Not on file    Gets together: Not on file    Attends religious service: Not on file    Active member of club or organization: Not on file    Attends meetings of clubs or organizations: Not on file  Relationship status: Not on file  . Intimate partner violence    Fear of current or ex partner: Not on file    Emotionally abused: Not on file    Physically abused: Not on file    Forced sexual activity: Not on file  Other Topics Concern  . Not on file  Social History Narrative  . Not on file     Review of Systems: Patient unable to provide secondary to lethargy  Vital Signs: Blood pressure 106/71, pulse 77, temperature (!) 97.1 F (36.2 C), resp. rate 10, height 5' 3"  (1.6 m), weight 122.5 kg, SpO2 96 %.  Weight trends: Filed Weights   06/15/19 1319  Weight: 122.5 kg    Physical Exam: General: Critically ill-appearing  Head: Normocephalic, atraumatic.  Eyes: Anicteric, EOMI  Nose: Mucous membranes moist, not inflammed,  nonerythematous.  Throat: Oropharynx nonerythematous, no exudate appreciated.   Neck: Supple, trachea midline.  Lungs:  Normal respiratory effort. Clear to auscultation BL without crackles or wheezes.  Heart: RRR. S1 and S2 normal without gallop, murmur, or rubs.  Abdomen:  BS normoactive. Soft, Nondistended, non-tender.  No masses or organomegaly.  Extremities: 2+ pretibial edema.  Neurologic: Lethargic but arousable  Skin: No visible rashes, scars.    Lab results: Basic Metabolic Panel: Recent Labs  Lab 06/15/19 2245 06/16/19 0223 06/16/19 0226 06/16/19 0614 06/16/19 0952  NA 132*  --  133* 134* 133*  K 7.4*  --  6.2* 4.8 4.5  CL 97*  --  98 99 97*  CO2 21*  --  24 26 25   GLUCOSE 187*  --  220* 253* 319*  BUN 108*  --  94* 88* 80*  CREATININE 16.90*  --  13.55* 12.48* 10.75*  CALCIUM 7.0*  --  7.2* 7.5* 7.7*  MG 1.9 1.9  --  1.8 1.9  PHOS 11.4*  --  9.7* 8.5* 7.6*    Liver Function Tests: Recent Labs  Lab 06/15/19 1348  06/16/19 0226 06/16/19 0614 06/16/19 0952  AST 12*  --   --   --   --   ALT 11  --   --   --   --   ALKPHOS 140*  --   --   --   --   BILITOT 0.6  --   --   --   --   PROT 6.7  --   --   --   --   ALBUMIN 2.4*   < > 2.3* 2.3* 2.3*   < > = values in this interval not displayed.   No results for input(s): LIPASE, AMYLASE in the last 168 hours. No results for input(s): AMMONIA in the last 168 hours.  CBC: Recent Labs  Lab 06/15/19 1348 06/16/19 0223  WBC 12.8* 12.8*  NEUTROABS 9.8*  --   HGB 9.8* 9.1*  HCT 32.7* 30.0*  MCV 78.4* 77.3*  PLT 236 233    Cardiac Enzymes: No results for input(s): CKTOTAL, CKMB, CKMBINDEX, TROPONINI in the last 168 hours.  BNP: Invalid input(s): POCBNP  CBG: Recent Labs  Lab 06/15/19 1800 06/16/19 0737 06/16/19 1111  GLUCAP 179* 296* 304*    Microbiology: Results for orders placed or performed during the hospital encounter of 06/15/19  Blood culture (routine x 2)     Status: None (Preliminary  result)   Collection Time: 06/15/19  1:48 PM   Specimen: BLOOD  Result Value Ref Range Status   Specimen Description BLOOD BLOOD LEFT ARM  Final  Special Requests   Final    BOTTLES DRAWN AEROBIC AND ANAEROBIC Blood Culture adequate volume   Culture   Final    NO GROWTH < 24 HOURS Performed at [facility], [address] [place], [place] [address]    Report Status PENDING  Incomplete  SARS Coronavirus 2 (CEPHEID- Performed in [place] hospital lab), Hosp Order     Status: None   Collection Time: 06/15/19  1:57 PM   Specimen: Nasopharyngeal Swab  Result Value Ref Range Status   SARS Coronavirus 2 NEGATIVE NEGATIVE Final    Comment: (NOTE) If result is NEGATIVE SARS-CoV-2 target nucleic acids are NOT DETECTED. The SARS-CoV-2 RNA is generally detectable in upper and lower  respiratory specimens during the acute phase of infection. The lowest  concentration of SARS-CoV-2 viral copies this assay can detect is 250  copies / mL. A negative result does not preclude SARS-CoV-2 infection  and should not be used as the sole basis for treatment or other  patient management decisions.  A negative result may occur with  improper specimen collection / handling, submission of specimen other  than nasopharyngeal swab, presence of viral mutation(s) within the  areas targeted by this assay, and inadequate number of viral copies  (<250 copies / mL). A negative result must be combined with clinical  observations, patient history, and epidemiological information. If result is POSITIVE SARS-CoV-2 target nucleic acids are DETECTED. The SARS-CoV-2 RNA is generally detectable in upper and lower  respiratory specimens dur ing the acute phase of infection.  Positive  results are indicative of active infection with SARS-CoV-2.  Clinical  correlation with patient history and other diagnostic information is  necessary to determine patient infection status.  Positive results do  not rule out  bacterial infection or co-infection with other viruses. If result is PRESUMPTIVE POSTIVE SARS-CoV-2 nucleic acids MAY BE PRESENT.   A presumptive positive result was obtained on the submitted specimen  and confirmed on repeat testing.  While 2019 novel coronavirus  (SARS-CoV-2) nucleic acids may be present in the submitted sample  additional confirmatory testing may be necessary for epidemiological  and / or clinical management purposes  to differentiate between  SARS-CoV-2 and other Sarbecovirus currently known to infect humans.  If clinically indicated additional testing with an alternate test  methodology [phone]) is advised. The SARS-CoV-2 RNA is generally  detectable in upper and lower respiratory sp ecimens during the acute  phase of infection. The expected result is Negative. Fact Sheet for Patients:  StrictlyIdeas.no Fact Sheet for Healthcare Providers: BankingDealers.co.za This test is not yet approved or cleared by the [place] FDA and has been authorized for detection and/or diagnosis of SARS-CoV-2 by FDA under an Emergency Use Authorization (EUA).  This EUA will remain in effect (meaning this test can be used) for the duration of the COVID-19 declaration under Section 564(b)(1) of the Act, 21 U.S.C. section 360bbb-3(b)(1), unless the authorization is terminated or revoked sooner. Performed at [facility], [address] [place], [place] [address]   Blood culture (routine x 2)     Status: None (Preliminary result)   Collection Time: 06/15/19  4:40 PM   Specimen: BLOOD  Result Value Ref Range Status   Specimen Description BLOOD BLOOD RIGHT ARM  Final   Special Requests   Final    BOTTLES DRAWN AEROBIC AND ANAEROBIC Blood Culture adequate volume   Culture   Final    NO GROWTH < 24 HOURS Performed at [facility]  Lab, [address] [place], [place] [address]    Report Status PENDING  Incomplete     Coagulation Studies: Recent Labs    06/15/19 1907  LABPROT 14.3  INR 1.1    Urinalysis: No results for input(s): COLORURINE, LABSPEC, PHURINE, GLUCOSEU, HGBUR, BILIRUBINUR, KETONESUR, PROTEINUR, UROBILINOGEN, NITRITE, LEUKOCYTESUR in the last 72 hours.  Invalid input(s): APPERANCEUR    Imaging: US Renal  Result Date: 06/16/2019 CLINICAL DATA:  Renal failure. EXAM: RENAL / URINARY TRACT ULTRASOUND COMPLETE COMPARISON:  None. FINDINGS: Right Kidney: Renal measurements: 11.7 x 5.9 x 5 6 cm = volume: 201 mL. Increased cortical echogenicity. Left Kidney: Renal measurements: 11.9 x 7.4 x 7.3 cm = volume: 336.2 mL. Contains a 1.3 cm mass, too small to completely characterize thought to be a cyst at time of real-time imaging. Increased cortical echogenicity. Bladder: Limited evaluation due to body habitus. There is a rounded hypoechoic/anechoic mass with calcifications adjacent to the right side of the bladder measuring 5.3 x 4.8 x 4.5 cm. IMPRESSION: 1. Increased cortical echogenicity in both kidneys consistent with medical renal disease. 2. There is a hypoechoic/anechoic mass with calcifications adjacent to right side of the bladder. This mass measures 5.3 x 4.8 x 4.5 cm. It is possible this mass could represent a bladder diverticulum containing bladder stones. A mass adjacent to the bladder is possible. Recommend a CT scan for better evaluation 3. A 1.3 cm hypoechoic mass in the left kidney is too small to characterize and nonspecific. While this may represent a mildly complicated cyst, solid mass is not completely excluded. Recommend attention to this region on the recommended CT scan to evaluate the pelvis. Electronically Signed   By: Dorise Bullion III M.D   On: 06/16/2019 12:06   Dg Chest Port 1 View  Result Date: 06/15/2019 CLINICAL DATA:  Unsuccessful left IJ placement attempt, concern for pneumothorax EXAM: PORTABLE CHEST 1 VIEW COMPARISON:  06/15/2019 FINDINGS: No significant  pneumothorax is appreciated. No vascular catheter or catheter fragment appreciated in the chest. Cardiomegaly with unchanged pulmonary vascular prominence and probable mild edema. IMPRESSION: No significant pneumothorax is appreciated. No vascular catheter or catheter fragment appreciated in the chest. Cardiomegaly with unchanged pulmonary vascular prominence and probable mild edema. Electronically Signed   By: Eddie Candle M.D.   On: 06/15/2019 21:51   Dg Chest Port 1 View  Result Date: 06/15/2019 CLINICAL DATA:  Shortness of breath. History of pulmonary hypertension. EXAM: PORTABLE CHEST 1 VIEW COMPARISON:  02/21/2019 FINDINGS: Cardiac enlargement and pulmonary vascular congestion. No pleural effusion or airspace opacities. Mild asymmetric elevation of right hemidiaphragm. IMPRESSION: Cardiac enlargement with pulmonary vascular congestion. Electronically Signed   By: Kerby Moors M.D.   On: 06/15/2019 14:06      Assessment & Plan: Pt is a 53 y.o. male with a PMHx of Bell's palsy, congestive heart failure, diabetes mellitus type 2, hypertension, obstructive sleep apnea, psoriasis, pulmonary hypertension, chronic kidney disease stage II, who was admitted to [facility] on 06/15/2019 for evaluation of severe acute renal failure in the setting of generalized weakness.   1.  Acute renal failure/chronic kidney disease stage II Baseline EGFR 64 with suspected dehydration/diabetes mellitus type 2 with chronic kidney disease..  Renal ultrasound was performed yesterday and was negative for hydronephrosis.  Suspect that patient became severely dehydrated as back in May his creatinine was 1.4 with a BUN of 21.  We will maintain the patient on CRRT at this time given severe azotemia.  Continue to monitor serum electrolytes closely.  We will proceed with additional work-up including SPEP, UPEP, ANA, ANCA antibodies, GBM antibodies, C3, C4.

## 2019-06-16 NOTE — Progress Notes (Signed)
CRRT Medication Adjustments:   Pharmacy consulted for CRRT medication adjustments for 53 yo male requiring CRRT. No adjustments warranted at this time. Will continue to monitor.   Currie Paris, [place]  06.30.2020 [phone]

## 2019-06-16 NOTE — Progress Notes (Signed)
CRITICAL CARE NOTE      CHIEF COMPLAINT:   Acute Kidney Injury and severe hyperkalemia   HPI   This is a pleasant 53 year old male with a history of diabetes, essential hypertension, psoriasis, obstructive sleep apnea, congestive heart failure Bell's palsy who came in due to weakness and worsening lower extremity and abdominal edema.  Patient states that he has not been compliant with his diabetic and hypertensive regimen.  In the ED patient was found to have acute kidney injury with creatinine over 16 and hyperkalemia with a K over 7.  Hospitalist had upgraded patient to medical intensive care unit for urgent hemodialysis after discussion with nephrologist.   S/p CVVH overnight.  Eating well  Remains anuric Intermittently confused with severe EDS due to OSA   PAST MEDICAL HISTORY   Past Medical History:  Diagnosis Date  . Bell's palsy   . CHF (congestive heart failure) ([place])   . Chronic kidney disease   . Diabetes mellitus without complication ([place])   . Hypertension   . NSVT (nonsustained ventricular tachycardia) ([place])   . Obstructive sleep apnea   . Psoriasis   . Pulmonary HTN ([place])      SURGICAL HISTORY   Past Surgical History:  Procedure Laterality Date  . CORONARY ANGIOPLASTY WITH STENT PLACEMENT    . NO PAST SURGERIES       FAMILY HISTORY   Family History  Problem Relation Age of Onset  . Heart failure Mother   . Kidney failure Brother      SOCIAL HISTORY   Social History   Tobacco Use  . Smoking status: Never Smoker  . Smokeless tobacco: Never Used  Substance Use Topics  . Alcohol use: Yes    Alcohol/week: 2.0 standard drinks    Types: 1 Shots of liquor, 1 Cans of beer per week    Comment: very rarely 1-2 times per month  . Drug use: No     MEDICATIONS   Current  Medication:  Current Facility-Administered Medications:  .  0.9 %  sodium chloride infusion, 250 mL, Intravenous, PRN, Demetrios Loll, MD .  acetaminophen (TYLENOL) tablet 650 mg, 650 mg, Oral, Q6H PRN **OR** acetaminophen (TYLENOL) suppository 650 mg, 650 mg, Rectal, Q6H PRN, Demetrios Loll, MD .  albuterol (VENTOLIN HFA) 108 (90 Base) MCG/ACT inhaler 2 puff, 2 puff, Inhalation, Q4H PRN, Demetrios Loll, MD .  amLODipine (NORVASC) tablet 5 mg, 5 mg, Oral, Daily, Demetrios Loll, MD, 5 mg at 06/15/19 1856 .  atorvastatin (LIPITOR) tablet 40 mg, 40 mg, Oral, QHS, Demetrios Loll, MD, Stopped at 06/15/19 2220 .  bisacodyl (DULCOLAX) EC tablet 5 mg, 5 mg, Oral, Daily PRN, Demetrios Loll, MD .  carvedilol (COREG) tablet 25 mg, 25 mg, Oral, Q12H, Demetrios Loll, MD, Stopped at 06/15/19 2220 .  cholecalciferol (VITAMIN D3) tablet 2,000 Units, 2,000 Units, Oral, Daily, Charlett Nose, RPH .  heparin injection 1,000-6,000 Units, 1,000-6,000 Units, CRRT, PRN, Awilda Bill, NP .  heparin injection 5,000 Units, 5,000 Units, Subcutaneous, Q8H, Demetrios Loll, MD, 5,000 Units at 06/16/19 [phone] .  hydrALAZINE (APRESOLINE) tablet 25 mg, 25 mg, Oral, Q8H, Demetrios Loll, MD, Stopped at 06/15/19 2220 .  HYDROcodone-acetaminophen (NORCO/VICODIN) 5-325 MG per tablet 1-2 tablet, 1-2 tablet, Oral, Q4H PRN, Demetrios Loll, MD, 1 tablet at 06/16/19 [phone]  insulin aspart (novoLOG) injection 0-9 Units, 0-9 Units, Subcutaneous, TID WC, Demetrios Loll, MD, 5 Units at 06/16/19 [phone] .  isosorbide mononitrate (IMDUR) 24 hr tablet 60 mg, 60 mg,  Oral, Daily, Demetrios Loll, MD, 60 mg at 06/15/19 1857 .  MEDLINE mouth rinse, 15 mL, Mouth Rinse, BID, Aleskerov, Fuad, MD, 15 mL at 06/15/19 2222 .  ondansetron (ZOFRAN) tablet 4 mg, 4 mg, Oral, Q6H PRN **OR** ondansetron (ZOFRAN) injection 4 mg, 4 mg, Intravenous, Q6H PRN, Demetrios Loll, MD .  pureflow IV solution for Dialysis, , CRRT, Continuous, Awilda Bill, NP, Last Rate: 2,000 mL/hr at 06/16/19 0605 .  senna-docusate  (Senokot-S) tablet 1 tablet, 1 tablet, Oral, QHS PRN, Demetrios Loll, MD .  sodium chloride flush (NS) 0.9 % injection 3 mL, 3 mL, Intravenous, Q12H, Demetrios Loll, MD, 3 mL at 06/15/19 2222 .  sodium chloride flush (NS) 0.9 % injection 3 mL, 3 mL, Intravenous, PRN, Demetrios Loll, MD .  traZODone (DESYREL) tablet 25-50 mg, 25-50 mg, Oral, QHS PRN, Demetrios Loll, MD .  umeclidinium-vilanterol [facility] ELLIPTA) 62.5-25 MCG/INH 1 puff, 1 puff, Inhalation, Daily, Demetrios Loll, MD, 1 puff at 06/15/19 1857    ALLERGIES   Shellfish allergy    REVIEW OF SYSTEMS    10 point ROS conducted is negative except for hunger  PHYSICAL EXAMINATION   Vitals:   06/16/19 0600 06/16/19 0805  BP: (!) 84/73 123/79  Pulse: 71 75  Resp: 10 (!) 9  Temp:    SpO2: 93% 95%    GENERAL: Mild distress due to shortness of breath and lower extremity pain HEAD: Normocephalic, atraumatic.  EYES: Pupils equal, round, reactive to light.  No scleral icterus.  MOUTH: Moist mucosal membrane. NECK: Supple. No thyromegaly. No nodules. No JVD.  PULMONARY: Decreased breath sounds bilaterally CARDIOVASCULAR: S1 and S2. Regular rate and rhythm. No murmurs, rubs, or gallops.  GASTROINTESTINAL: Soft, nontender, non-distended. No masses. Positive bowel sounds. No hepatosplenomegaly.  MUSCULOSKELETAL: No swelling, clubbing, or 3+ edema NEUROLOGIC: Mild distress due to acute illness SKIN:intact,warm,dry   LABS AND IMAGING     LAB RESULTS: Recent Labs  Lab 06/15/19 2245 06/16/19 0226 06/16/19 0614  NA 132* 133* 134*  K 7.4* 6.2* 4.8  CL 97* 98 99  CO2 21* 24 26  BUN 108* 94* 88*  CREATININE 16.90* 13.55* 12.48*  GLUCOSE 187* 220* 253*   Recent Labs  Lab 06/15/19 1348 06/16/19 0223  HGB 9.8* 9.1*  HCT 32.7* 30.0*  WBC 12.8* 12.8*  PLT 236 233     IMAGING RESULTS: Dg Chest Port 1 View  Result Date: 06/15/2019 CLINICAL DATA:  Unsuccessful left IJ placement attempt, concern for pneumothorax EXAM: PORTABLE CHEST 1  VIEW COMPARISON:  06/15/2019 FINDINGS: No significant pneumothorax is appreciated. No vascular catheter or catheter fragment appreciated in the chest. Cardiomegaly with unchanged pulmonary vascular prominence and probable mild edema. IMPRESSION: No significant pneumothorax is appreciated. No vascular catheter or catheter fragment appreciated in the chest. Cardiomegaly with unchanged pulmonary vascular prominence and probable mild edema. Electronically Signed   By: Eddie Candle M.D.   On: 06/15/2019 21:51   Dg Chest Port 1 View  Result Date: 06/15/2019 CLINICAL DATA:  Shortness of breath. History of pulmonary hypertension. EXAM: PORTABLE CHEST 1 VIEW COMPARISON:  02/21/2019 FINDINGS: Cardiac enlargement and pulmonary vascular congestion. No pleural effusion or airspace opacities. Mild asymmetric elevation of right hemidiaphragm. IMPRESSION: Cardiac enlargement with pulmonary vascular congestion. Electronically Signed   By: Kerby Moors M.D.   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 with noncompliance -Nephrology on case-appreciate input following recommendations -Emergent HD -follow chem 7 -follow UO -continue Foley Catheter-assess need daily    Obstructive sleep apnea -May provide CPAP with home settings as per RT   GI/Nutrition GI PROPHYLAXIS as indicated DIET-->TF's as tolerated Constipation protocol as indicated  ENDO - ICU hypoglycemic\Hyperglycemia protocol  -check FSBS per protocol   ELECTROLYTES -follow labs as needed -replace as needed -pharmacy consultation   DVT/GI PRX ordered -SCDs  TRANSFUSIONS AS NEEDED MONITOR FSBS ASSESS the need for LABS as needed   Critical care provider statement:    Critical care time (minutes):  32   Critical care time was exclusive of:  Separately billable procedures and treating other patients   Critical care was necessary to treat or prevent imminent or life-threatening deterioration of the following conditions:   Severe hyperkalemia, acute on chronic renal insufficiency stage IV, morbid obesity, pulmonary hypertension, obstructive sleep apnea   Critical care was time spent personally by me on the following activities:  Development of treatment plan with patient or surrogate, discussions with consultants, evaluation of patient's response to treatment, examination of patient, obtaining history from patient or surrogate, ordering and performing treatments and interventions, ordering and review of laboratory studies and re-evaluation of patient's condition.  I assumed direction of critical care for this patient from another provider in my specialty: no    This document was prepared using Dragon voice recognition software and may include unintentional dictation errors.    Ottie Glazier, M.D.  Division of [facility]

## 2019-06-16 NOTE — Progress Notes (Signed)
[place] at [place] NAME: [name]    MR#:  [id number]  DATE OF BIRTH:  18-Apr-1966  SUBJECTIVE:   Patient presented to the hospital due to acute on chronic renal failure with severe hyperkalemia and respiratory failure with volume overload.  Patient was noted to be oliguric/anuric and therefore started on CRRT.  Patient remains on CRRT.  REVIEW OF SYSTEMS:    Review of Systems  Unable to perform ROS: Mental acuity    Nutrition: Renal/Carb modified Tolerating Diet: yes Tolerating PT: Await Eval.   DRUG ALLERGIES:   Allergies  Allergen Reactions   Shellfish Allergy Anaphylaxis    Face and throat swelling, difficulty breathing Allergy can be triggered by touching (contact)    VITALS:  Blood pressure (!) 124/58, pulse 79, temperature (!) 97.1 F (36.2 C), resp. rate (!) 7, height 5\' 3"  (1.6 m), weight 122.5 kg, SpO2 97 %.  PHYSICAL EXAMINATION:   Physical Exam  GENERAL:  53 y.o.-year-old obese patient lying in bed in no acute distress.  EYES: Pupils equal, round, reactive to light and accommodation. No scleral icterus. Extraocular muscles intact.  HEENT: Head atraumatic, normocephalic. Oropharynx and nasopharynx clear.  NECK:  Supple, no jugular venous distention. No thyroid enlargement, no tenderness.  LUNGS: Poor Resp. effort, no wheezing, rales, rhonchi. No use of accessory muscles of respiration.  CARDIOVASCULAR: S1, S2 normal. No murmurs, rubs, or gallops.  ABDOMEN: Soft, nontender, nondistended. Bowel sounds present. No organomegaly or mass.  EXTREMITIES: No cyanosis, clubbing, + 2 edema b/l. Signs of chronic venous stasis b/l.     NEUROLOGIC: Cranial nerves II through XII are intact. No focal Motor or sensory deficits b/l.  Globally weak.   PSYCHIATRIC: The patient is alert and oriented x 1.  SKIN: No obvious rash, lesion, or ulcer.   Left groin dialysis catheter in place   LABORATORY PANEL:   CBC Recent  Labs  Lab 06/16/19 0223  WBC 12.8*  HGB 9.1*  HCT 30.0*  PLT 233   ------------------------------------------------------------------------------------------------------------------  Chemistries  Recent Labs  Lab 06/15/19 1348  06/16/19 0952 06/16/19 1318  NA 131*   < > 133* 133*  K 7.2*   < > 4.5 4.8  CL 97*   < > 97* 96*  CO2 18*   < > 25 26  GLUCOSE 262*   < > 319* 300*  BUN 109*   < > 80* 71*  CREATININE 16.23*   < > 10.75* 9.39*  CALCIUM 7.1*   < > 7.7* 7.7*  MG  --    < > 1.9  --   AST 12*  --   --   --   ALT 11  --   --   --   ALKPHOS 140*  --   --   --   BILITOT 0.6  --   --   --    < > = values in this interval not displayed.   ------------------------------------------------------------------------------------------------------------------  Cardiac Enzymes No results for input(s): TROPONINI in the last 168 hours. ------------------------------------------------------------------------------------------------------------------  RADIOLOGY:  US Renal  Result Date: 06/16/2019 CLINICAL DATA:  Renal failure. EXAM: RENAL / URINARY TRACT ULTRASOUND COMPLETE COMPARISON:  None. FINDINGS: Right Kidney: Renal measurements: 11.7 x 5.9 x 5 6 cm = volume: 201 mL. Increased cortical echogenicity. Left Kidney: Renal measurements: 11.9 x 7.4 x 7.3 cm = volume: 336.2 mL. Contains a 1.3 cm mass, too small to completely characterize thought to be a cyst  at time of real-time imaging. Increased cortical echogenicity. Bladder: Limited evaluation due to body habitus. There is a rounded hypoechoic/anechoic mass with calcifications adjacent to the right side of the bladder measuring 5.3 x 4.8 x 4.5 cm. IMPRESSION: 1. Increased cortical echogenicity in both kidneys consistent with medical renal disease. 2. There is a hypoechoic/anechoic mass with calcifications adjacent to right side of the bladder. This mass measures 5.3 x 4.8 x 4.5 cm. It is possible this mass could represent a bladder  diverticulum containing bladder stones. A mass adjacent to the bladder is possible. Recommend a CT scan for better evaluation 3. A 1.3 cm hypoechoic mass in the left kidney is too small to characterize and nonspecific. While this may represent a mildly complicated cyst, solid mass is not completely excluded. Recommend attention to this region on the recommended CT scan to evaluate the pelvis. Electronically Signed   By: Dorise Bullion III M.D   On: 06/16/2019 12:06   Dg Chest Port 1 View  Result Date: 06/15/2019 CLINICAL DATA:  Unsuccessful left IJ placement attempt, concern for pneumothorax EXAM: PORTABLE CHEST 1 VIEW COMPARISON:  06/15/2019 FINDINGS: No significant pneumothorax is appreciated. No vascular catheter or catheter fragment appreciated in the chest. Cardiomegaly with unchanged pulmonary vascular prominence and probable mild edema. IMPRESSION: No significant pneumothorax is appreciated. No vascular catheter or catheter fragment appreciated in the chest. Cardiomegaly with unchanged pulmonary vascular prominence and probable mild edema. Electronically Signed   By: Eddie Candle M.D.   On: 06/15/2019 21:51   Dg Chest Port 1 View  Result Date: 06/15/2019 CLINICAL DATA:  Shortness of breath. History of pulmonary hypertension. EXAM: PORTABLE CHEST 1 VIEW COMPARISON:  02/21/2019 FINDINGS: Cardiac enlargement and pulmonary vascular congestion. No pleural effusion or airspace opacities. Mild asymmetric elevation of right hemidiaphragm. IMPRESSION: Cardiac enlargement with pulmonary vascular congestion. Electronically Signed   By: Kerby Moors M.D.   On: 06/15/2019 14:06     ASSESSMENT AND PLAN:   53 year old male with past medical history of hypertension, diabetes, CKD stage III, history of chronic diastolic CHF, obstructive sleep apnea, psoriasis, pulmonary hypertension who presented to the hospital due to acute on chronic renal failure with severe hyperkalemia.  1.  Acute on chronic renal  failure- secondary to dehydration, diabetes and underlying CKD. - Patient's baseline creatinines around 1.4 in May of this past year and patient presented to the hospital the creatinine greater than 10. - Remains oliguric/anuric and therefore started on CRRT. - Renal ultrasound was negative for hydronephrosis.  Serologic work-up has been initiated by nephrology. - Continue CRRT and further care as per nephrology.  2.  Hyperkalemia-secondary to the acute on chronic renal failure. -Patient received some Lokelma and was started on dialysis.  Much improved.  3.  Essential hypertension-continue carvedilol, Norvasc, Imdur.  4.  Diabetes type 2 with underlying CKD stage III-continue Lantus, sliding scale insulin. -Continue carb controlled diet.  Follow blood sugars.  5.  COPD-no acute exacerbation -Continue Anoro Ellipta, albuterol inhaler as needed.  6.  Hyperlipidemia-continue atorvastatin.   All the records are reviewed and case discussed with Care Management/Social Worker. Management plans discussed with the patient, family and they are in agreement.  CODE STATUS: Full code  DVT Prophylaxis: Hep. SQ  TOTAL TIME TAKING CARE OF THIS PATIENT: 30 minutes.   POSSIBLE D/C IN 3-4 DAYS, DEPENDING ON CLINICAL CONDITION.   Henreitta Leber M.D on 06/16/2019 at 3:28 PM  Between 7am to 6pm - Pager - [phone]  After 6pm go  to [email or web address] - password [email or web address] [facility]  Office  [phone]  CC: Primary care physician; Lavera Guise, MD

## 2019-06-17 LAB — GLUCOSE, CAPILLARY
Glucose-Capillary: 110 mg/dL — ABNORMAL HIGH (ref 70–99)
Glucose-Capillary: 135 mg/dL — ABNORMAL HIGH (ref 70–99)
Glucose-Capillary: 105 mg/dL — ABNORMAL HIGH (ref 70–99)
Glucose-Capillary: 103 mg/dL — ABNORMAL HIGH (ref 70–99)

## 2019-06-17 LAB — RENAL FUNCTION PANEL
Albumin: 2.2 g/dL — ABNORMAL LOW (ref 3.5–5.0)
Albumin: 2.1 g/dL — ABNORMAL LOW (ref 3.5–5.0)
Albumin: 2.2 g/dL — ABNORMAL LOW (ref 3.5–5.0)
Albumin: 2.1 g/dL — ABNORMAL LOW (ref 3.5–5.0)
Albumin: 2.2 g/dL — ABNORMAL LOW (ref 3.5–5.0)
Albumin: 2 g/dL — ABNORMAL LOW (ref 3.5–5.0)
Anion gap: 9 (ref 5–15)
Anion gap: 11 (ref 5–15)
Anion gap: 12 (ref 5–15)
Anion gap: 9 (ref 5–15)
Anion gap: 10 (ref 5–15)
Anion gap: 8 (ref 5–15)
BUN: 52 mg/dL — ABNORMAL HIGH (ref 6–20)
BUN: 49 mg/dL — ABNORMAL HIGH (ref 6–20)
BUN: 44 mg/dL — ABNORMAL HIGH (ref 6–20)
BUN: 39 mg/dL — ABNORMAL HIGH (ref 6–20)
BUN: 37 mg/dL — ABNORMAL HIGH (ref 6–20)
BUN: 37 mg/dL — ABNORMAL HIGH (ref 6–20)
CO2: 25 mmol/L (ref 22–32)
CO2: 25 mmol/L (ref 22–32)
CO2: 24 mmol/L (ref 22–32)
CO2: 25 mmol/L (ref 22–32)
CO2: 27 mmol/L (ref 22–32)
CO2: 25 mmol/L (ref 22–32)
Calcium: 7.8 mg/dL — ABNORMAL LOW (ref 8.9–10.3)
Calcium: 8.2 mg/dL — ABNORMAL LOW (ref 8.9–10.3)
Calcium: 8.2 mg/dL — ABNORMAL LOW (ref 8.9–10.3)
Calcium: 8.1 mg/dL — ABNORMAL LOW (ref 8.9–10.3)
Calcium: 8.2 mg/dL — ABNORMAL LOW (ref 8.9–10.3)
Calcium: 8 mg/dL — ABNORMAL LOW (ref 8.9–10.3)
Chloride: 100 mmol/L (ref 98–111)
Chloride: 100 mmol/L (ref 98–111)
Chloride: 99 mmol/L (ref 98–111)
Chloride: 101 mmol/L (ref 98–111)
Chloride: 99 mmol/L (ref 98–111)
Chloride: 103 mmol/L (ref 98–111)
Creatinine, Ser: 7.9 mg/dL — ABNORMAL HIGH (ref 0.61–1.24)
Creatinine, Ser: 7.41 mg/dL — ABNORMAL HIGH (ref 0.61–1.24)
Creatinine, Ser: 6.87 mg/dL — ABNORMAL HIGH (ref 0.61–1.24)
Creatinine, Ser: 6.29 mg/dL — ABNORMAL HIGH (ref 0.61–1.24)
Creatinine, Ser: 5.93 mg/dL — ABNORMAL HIGH (ref 0.61–1.24)
Creatinine, Ser: 5.87 mg/dL — ABNORMAL HIGH (ref 0.61–1.24)
GFR calc Af Amer: 8 mL/min — ABNORMAL LOW (ref >60)
GFR calc Af Amer: 9 mL/min — ABNORMAL LOW (ref >60)
GFR calc Af Amer: 10 mL/min — ABNORMAL LOW (ref >60)
GFR calc Af Amer: 11 mL/min — ABNORMAL LOW (ref >60)
GFR calc Af Amer: 12 mL/min — ABNORMAL LOW (ref >60)
GFR calc Af Amer: 12 mL/min — ABNORMAL LOW (ref >60)
GFR calc non Af Amer: 7 mL/min — ABNORMAL LOW (ref >60)
GFR calc non Af Amer: 8 mL/min — ABNORMAL LOW (ref >60)
GFR calc non Af Amer: 9 mL/min — ABNORMAL LOW (ref >60)
GFR calc non Af Amer: 9 mL/min — ABNORMAL LOW (ref >60)
GFR calc non Af Amer: 10 mL/min — ABNORMAL LOW (ref >60)
GFR calc non Af Amer: 10 mL/min — ABNORMAL LOW (ref >60)
Glucose, Bld: 121 mg/dL — ABNORMAL HIGH (ref 70–99)
Glucose, Bld: 124 mg/dL — ABNORMAL HIGH (ref 70–99)
Glucose, Bld: 134 mg/dL — ABNORMAL HIGH (ref 70–99)
Glucose, Bld: 115 mg/dL — ABNORMAL HIGH (ref 70–99)
Glucose, Bld: 105 mg/dL — ABNORMAL HIGH (ref 70–99)
Glucose, Bld: 114 mg/dL — ABNORMAL HIGH (ref 70–99)
Phosphorus: 6.2 mg/dL — ABNORMAL HIGH (ref 2.5–4.6)
Phosphorus: 6 mg/dL — ABNORMAL HIGH (ref 2.5–4.6)
Phosphorus: 5.6 mg/dL — ABNORMAL HIGH (ref 2.5–4.6)
Phosphorus: 4.7 mg/dL — ABNORMAL HIGH (ref 2.5–4.6)
Phosphorus: 5.1 mg/dL — ABNORMAL HIGH (ref 2.5–4.6)
Phosphorus: 5.5 mg/dL — ABNORMAL HIGH (ref 2.5–4.6)
Potassium: 5 mmol/L (ref 3.5–5.1)
Potassium: 4.8 mmol/L (ref 3.5–5.1)
Potassium: 4.5 mmol/L (ref 3.5–5.1)
Potassium: 4.2 mmol/L (ref 3.5–5.1)
Potassium: 4.2 mmol/L (ref 3.5–5.1)
Potassium: 4.4 mmol/L (ref 3.5–5.1)
Sodium: 134 mmol/L — ABNORMAL LOW (ref 135–145)
Sodium: 136 mmol/L (ref 135–145)
Sodium: 135 mmol/L (ref 135–145)
Sodium: 135 mmol/L (ref 135–145)
Sodium: 136 mmol/L (ref 135–145)
Sodium: 136 mmol/L (ref 135–145)

## 2019-06-17 LAB — MAGNESIUM
Magnesium: 1.8 mg/dL (ref 1.7–2.4)
Magnesium: 1.8 mg/dL (ref 1.7–2.4)
Magnesium: 1.8 mg/dL (ref 1.7–2.4)
Magnesium: 1.7 mg/dL (ref 1.7–2.4)
Magnesium: 1.9 mg/dL (ref 1.7–2.4)
Magnesium: 1.6 mg/dL — ABNORMAL LOW (ref 1.7–2.4)

## 2019-06-17 LAB — CBC
HCT: 32 % — ABNORMAL LOW (ref 39.0–52.0)
Hemoglobin: 9.6 g/dL — ABNORMAL LOW (ref 13.0–17.0)
MCH: 23.6 pg — ABNORMAL LOW (ref 26.0–34.0)
MCHC: 30 g/dL (ref 30.0–36.0)
MCV: 78.8 fL — ABNORMAL LOW (ref 80.0–100.0)
Platelets: 201 10*3/uL (ref 150–400)
RBC: 4.06 MIL/uL — ABNORMAL LOW (ref 4.22–5.81)
RDW: 18.7 % — ABNORMAL HIGH (ref 11.5–15.5)
WBC: 11.8 10*3/uL — ABNORMAL HIGH (ref 4.0–10.5)
nRBC: 0.2 % (ref 0.0–0.2)

## 2019-06-17 LAB — POTASSIUM
Potassium: 4.9 mmol/L (ref 3.5–5.1)
Potassium: 5.1 mmol/L (ref 3.5–5.1)
Potassium: 5.2 mmol/L — ABNORMAL HIGH (ref 3.5–5.1)

## 2019-06-17 LAB — APTT: aPTT: 47 seconds — ABNORMAL HIGH (ref 24–36)

## 2019-06-17 NOTE — Progress Notes (Signed)
[place] at [place] NAME: [name]    MR#:  [id number]  DATE OF BIRTH:  02/29/1966  SUBJECTIVE:   Patient presented to the hospital due to acute on chronic renal failure with severe hyperkalemia and respiratory failure with volume overload.  Patient was noted to be oliguric/anuric and therefore started on CRRT.  Patient remains on CRRT. Patient complains of back pain.  Still oliguric. REVIEW OF SYSTEMS:    Review of Systems  Unable to perform ROS: Mental acuity    Nutrition: Renal/Carb modified Tolerating Diet: yes Tolerating PT: Await Eval.   DRUG ALLERGIES:   Allergies  Allergen Reactions   Shellfish Allergy Anaphylaxis    Face and throat swelling, difficulty breathing Allergy can be triggered by touching (contact)    VITALS:  Blood pressure (!) 174/91, pulse 93, temperature 98.3 F (36.8 C), resp. rate 12, height 5\' 3"  (1.6 m), weight 129.4 kg, SpO2 97 %.  PHYSICAL EXAMINATION:   Physical Exam  GENERAL:  53 y.o.-year-old obese patient lying in bed in no acute distress.  Morbid obesity. EYES: Pupils equal, round, reactive to light and accommodation. No scleral icterus. Extraocular muscles intact.  HEENT: Head atraumatic, normocephalic. Oropharynx and nasopharynx clear.  NECK:  Supple, no jugular venous distention. No thyroid enlargement, no tenderness.  LUNGS: Poor Resp. effort, no wheezing, rales, rhonchi. No use of accessory muscles of respiration.  CARDIOVASCULAR: S1, S2 normal. No murmurs, rubs, or gallops.  ABDOMEN: Soft, nontender, nondistended. Bowel sounds present. No organomegaly or mass.  EXTREMITIES: No cyanosis, clubbing, + 2 edema b/l. Signs of chronic venous stasis b/l.     NEUROLOGIC: Cranial nerves II through XII are intact. No focal Motor or sensory deficits b/l.  Globally weak.   PSYCHIATRIC: The patient is alert and oriented x 3.  SKIN: No obvious rash, lesion, or ulcer.   Left groin dialysis  catheter in place   LABORATORY PANEL:   CBC Recent Labs  Lab 06/17/19 0526  WBC 11.8*  HGB 9.6*  HCT 32.0*  PLT 201   ------------------------------------------------------------------------------------------------------------------  Chemistries  Recent Labs  Lab 06/15/19 1348  06/17/19 1055  NA 131*   < > 135  K 7.2*   < > 4.5  CL 97*   < > 99  CO2 18*   < > 24  GLUCOSE 262*   < > 134*  BUN 109*   < > 44*  CREATININE 16.23*   < > 6.87*  CALCIUM 7.1*   < > 8.2*  MG  --    < > 1.8  AST 12*  --   --   ALT 11  --   --   ALKPHOS 140*  --   --   BILITOT 0.6  --   --    < > = values in this interval not displayed.   ------------------------------------------------------------------------------------------------------------------  Cardiac Enzymes No results for input(s): TROPONINI in the last 168 hours. ------------------------------------------------------------------------------------------------------------------  RADIOLOGY:  US Renal  Result Date: 06/16/2019 CLINICAL DATA:  Renal failure. EXAM: RENAL / URINARY TRACT ULTRASOUND COMPLETE COMPARISON:  None. FINDINGS: Right Kidney: Renal measurements: 11.7 x 5.9 x 5 6 cm = volume: 201 mL. Increased cortical echogenicity. Left Kidney: Renal measurements: 11.9 x 7.4 x 7.3 cm = volume: 336.2 mL. Contains a 1.3 cm mass, too small to completely characterize thought to be a cyst at time of real-time imaging. Increased cortical echogenicity. Bladder: Limited evaluation due to body habitus. There is a  rounded hypoechoic/anechoic mass with calcifications adjacent to the right side of the bladder measuring 5.3 x 4.8 x 4.5 cm. IMPRESSION: 1. Increased cortical echogenicity in both kidneys consistent with medical renal disease. 2. There is a hypoechoic/anechoic mass with calcifications adjacent to right side of the bladder. This mass measures 5.3 x 4.8 x 4.5 cm. It is possible this mass could represent a bladder diverticulum containing  bladder stones. A mass adjacent to the bladder is possible. Recommend a CT scan for better evaluation 3. A 1.3 cm hypoechoic mass in the left kidney is too small to characterize and nonspecific. While this may represent a mildly complicated cyst, solid mass is not completely excluded. Recommend attention to this region on the recommended CT scan to evaluate the pelvis. Electronically Signed   By: Dorise Bullion III M.D   On: 06/16/2019 12:06   Dg Chest Port 1 View  Result Date: 06/15/2019 CLINICAL DATA:  Unsuccessful left IJ placement attempt, concern for pneumothorax EXAM: PORTABLE CHEST 1 VIEW COMPARISON:  06/15/2019 FINDINGS: No significant pneumothorax is appreciated. No vascular catheter or catheter fragment appreciated in the chest. Cardiomegaly with unchanged pulmonary vascular prominence and probable mild edema. IMPRESSION: No significant pneumothorax is appreciated. No vascular catheter or catheter fragment appreciated in the chest. Cardiomegaly with unchanged pulmonary vascular prominence and probable mild edema. Electronically Signed   By: Eddie Candle M.D.   On: 06/15/2019 21:51     ASSESSMENT AND PLAN:   53 year old male with past medical history of hypertension, diabetes, CKD stage III, history of chronic diastolic CHF, obstructive sleep apnea, psoriasis, pulmonary hypertension who presented to the hospital due to acute on chronic renal failure with severe hyperkalemia.  1.  Acute on chronic renal failure- secondary to dehydration, diabetes and underlying CKD. - Patient's baseline creatinines around 1.4 in May of this past year and patient presented to the hospital the creatinine greater than 10. - Remains oliguric/anuric and therefore started on CRRT. - Renal ultrasound was negative for hydronephrosis.  Serologic work-up has been initiated by nephrology. - Continue CRRT for now per Dr. Holley Raring.  2.  Hyperkalemia-secondary to the acute on chronic renal failure. -Patient received some  Lokelma and was started on dialysis.  Improved.  3.  Essential hypertension-continue carvedilol, Norvasc, Imdur.  4.  Diabetes type 2 with underlying CKD stage III-continue Lantus, sliding scale insulin. -Continue carb controlled diet.  Follow blood sugars.  5.  COPD-no acute exacerbation -Continue Anoro Ellipta, albuterol inhaler as needed.  6.  Hyperlipidemia-continue atorvastatin.  I discussed with Dr. Lanney Gins. All the records are reviewed and case discussed with Care Management/Social Worker. Management plans discussed with the patient, family and they are in agreement.  CODE STATUS: Full code  DVT Prophylaxis: Hep. SQ  TOTAL TIME TAKING CARE OF THIS PATIENT: 33 minutes.   POSSIBLE D/C IN ? DAYS, DEPENDING ON CLINICAL CONDITION.   Demetrios Loll M.D on 06/17/2019 at 2:36 PM  Between 7am to 6pm - Pager - [phone]  After 6pm go to [email or web address] - password [email or web address] [facility]  Office  [phone]  CC: Primary care physician; Lavera Guise, MD

## 2019-06-17 NOTE — Progress Notes (Signed)
[facility]  ROUNDING NOTE   Subjective:  Patient still with no urine output. Tolerating CRRT well. We added ultrafiltration of 50 cc/h this a.m.   Objective:  Vital signs in last 24 hours:  Temp:  [96 F (35.6 C)-98.3 F (36.8 C)] 98 F (36.7 C) (07/01 1100) Pulse Rate:  [78-96] 94 (07/01 1517) Resp:  [9-21] 21 (07/01 1517) BP: (105-177)/(62-98) 177/98 (07/01 1517) SpO2:  [94 %-99 %] 96 % (07/01 1517) FiO2 (%):  [32 %] 32 % (06/30 1949) Weight:  [129.4 kg] 129.4 kg (07/01 0500)  Weight change: 6.929 kg Filed Weights   06/15/19 1319 06/17/19 0500  Weight: 122.5 kg 129.4 kg    Intake/Output: I/O last 3 completed shifts: In: 240 [P.O.:240] Out: 0    Intake/Output this shift:  No intake/output data recorded.  Physical Exam: General: No acute distress  Head: Normocephalic, atraumatic. Moist oral mucosal membranes  Eyes: Anicteric  Neck: Supple, trachea midline  Lungs:  Clear to auscultation, normal effort  Heart: S1S2 no rubs  Abdomen:  Soft, nontender, bowel sounds present  Extremities: 2+ peripheral edema.  Neurologic: Lethargic but arousable  Skin: No lesions  Access: Left femoral vein dialysis catheter    Basic Metabolic Panel: Recent Labs  Lab 06/16/19 2202  06/17/19 0109 06/17/19 0209 06/17/19 0526 06/17/19 1055 06/17/19 1450  NA 134*  --   --  134* 136 135 135  K 5.4*   < > 5.1 4.9  5.0 4.8 4.5 4.2  CL 98  --   --  100 100 99 101  CO2 25  --   --  _0 GLUCOSE 126*  --   --  121* 124* 134* 115*  BUN 57*  --   --  52* 49* 44* 39*  CREATININE 8.30*  --   --  7.90* 7.41* 6.87* 6.29*  CALCIUM 7.9*  --   --  7.8* 8.2* 8.2* 8.1*  MG 1.8  --   --  1.8 1.8 1.8 1.7  PHOS 6.6*  --   --  6.2* 6.0* 5.6* 4.7*   < > = values in this interval not displayed.    Liver Function Tests: Recent Labs  Lab 06/15/19 1348  06/16/19 2202 06/17/19 0209 06/17/19 0526 06/17/19 1055 06/17/19 1450  AST 12*  --   --   --   --   --   --   ALT  11  --   --   --   --   --   --   ALKPHOS 140*  --   --   --   --   --   --   BILITOT 0.6  --   --   --   --   --   --   PROT 6.7  --   --   --   --   --   --   ALBUMIN 2.4*   < > 2.3* 2.2* 2.1* 2.2* 2.1*   < > = values in this interval not displayed.   No results for input(s): LIPASE, AMYLASE in the last 168 hours. No results for input(s): AMMONIA in the last 168 hours.  CBC: Recent Labs  Lab 06/15/19 1348 06/16/19 0223 06/17/19 0526  WBC 12.8* 12.8* 11.8*  NEUTROABS 9.8*  --   --   HGB 9.8* 9.1* 9.6*  HCT 32.7* 30.0* 32.0*  MCV 78.4* 77.3* 78.8*  PLT 236 233 201    Cardiac Enzymes: No results for  input(s): CKTOTAL, CKMB, CKMBINDEX, TROPONINI in the last 168 hours.  BNP: Invalid input(s): POCBNP  CBG: Recent Labs  Lab 06/16/19 1111 06/16/19 1614 06/16/19 2126 06/17/19 0714 06/17/19 1228  GLUCAP 304* 216* 133* 110* 135*    Microbiology: Results for orders placed or performed during the hospital encounter of 06/15/19  Blood culture (routine x 2)     Status: None (Preliminary result)   Collection Time: 06/15/19  1:48 PM   Specimen: BLOOD  Result Value Ref Range Status   Specimen Description BLOOD BLOOD LEFT ARM  Final   Special Requests   Final    BOTTLES DRAWN AEROBIC AND ANAEROBIC Blood Culture adequate volume   Culture   Final    NO GROWTH 2 DAYS Performed at [facility], [address], [place], [place] [address]    Report Status PENDING  Incomplete  SARS Coronavirus 2 (CEPHEID- Performed in [place] hospital lab), Hosp Order     Status: None   Collection Time: 06/15/19  1:57 PM   Specimen: Nasopharyngeal Swab  Result Value Ref Range Status   SARS Coronavirus 2 NEGATIVE NEGATIVE Final    Comment: (NOTE) If result is NEGATIVE SARS-CoV-2 target nucleic acids are NOT DETECTED. The SARS-CoV-2 RNA is generally detectable in upper and lower  respiratory specimens during the acute phase of infection. The lowest  concentration of SARS-CoV-2  viral copies this assay can detect is 250  copies / mL. A negative result does not preclude SARS-CoV-2 infection  and should not be used as the sole basis for treatment or other  patient management decisions.  A negative result may occur with  improper specimen collection / handling, submission of specimen other  than nasopharyngeal swab, presence of viral mutation(s) within the  areas targeted by this assay, and inadequate number of viral copies  (<250 copies / mL). A negative result must be combined with clinical  observations, patient history, and epidemiological information. If result is POSITIVE SARS-CoV-2 target nucleic acids are DETECTED. The SARS-CoV-2 RNA is generally detectable in upper and lower  respiratory specimens dur ing the acute phase of infection.  Positive  results are indicative of active infection with SARS-CoV-2.  Clinical  correlation with patient history and other diagnostic information is  necessary to determine patient infection status.  Positive results do  not rule out bacterial infection or co-infection with other viruses. If result is PRESUMPTIVE POSTIVE SARS-CoV-2 nucleic acids MAY BE PRESENT.   A presumptive positive result was obtained on the submitted specimen  and confirmed on repeat testing.  While 2019 novel coronavirus  (SARS-CoV-2) nucleic acids may be present in the submitted sample  additional confirmatory testing may be necessary for epidemiological  and / or clinical management purposes  to differentiate between  SARS-CoV-2 and other Sarbecovirus currently known to infect humans.  If clinically indicated additional testing with an alternate test  methodology [phone]) is advised. The SARS-CoV-2 RNA is generally  detectable in upper and lower respiratory sp ecimens during the acute  phase of infection. The expected result is Negative. Fact Sheet for Patients:  StrictlyIdeas.no Fact Sheet for Healthcare  Providers: BankingDealers.co.za This test is not yet approved or cleared by the [place] FDA and has been authorized for detection and/or diagnosis of SARS-CoV-2 by FDA under an Emergency Use Authorization (EUA).  This EUA will remain in effect (meaning this test can be used) for the duration of the COVID-19 declaration under Section 564(b)(1) of the Act, 21 U.S.C. section 360bbb-3(b)(1), unless the  authorization is terminated or revoked sooner. Performed at [facility], [address] [place], [place] [address]   Blood culture (routine x 2)     Status: None (Preliminary result)   Collection Time: 06/15/19  4:40 PM   Specimen: BLOOD  Result Value Ref Range Status   Specimen Description BLOOD BLOOD RIGHT ARM  Final   Special Requests   Final    BOTTLES DRAWN AEROBIC AND ANAEROBIC Blood Culture adequate volume   Culture   Final    NO GROWTH 2 DAYS Performed at [facility], [address] [place], [place] [address]    Report Status PENDING  Incomplete    Coagulation Studies: Recent Labs    06/15/19 1907  LABPROT 14.3  INR 1.1    Urinalysis: No results for input(s): COLORURINE, LABSPEC, PHURINE, GLUCOSEU, HGBUR, BILIRUBINUR, KETONESUR, PROTEINUR, UROBILINOGEN, NITRITE, LEUKOCYTESUR in the last 72 hours.  Invalid input(s): APPERANCEUR    Imaging: US Renal  Result Date: 06/16/2019 CLINICAL DATA:  Renal failure. EXAM: RENAL / URINARY TRACT ULTRASOUND COMPLETE COMPARISON:  None. FINDINGS: Right Kidney: Renal measurements: 11.7 x 5.9 x 5 6 cm = volume: 201 mL. Increased cortical echogenicity. Left Kidney: Renal measurements: 11.9 x 7.4 x 7.3 cm = volume: 336.2 mL. Contains a 1.3 cm mass, too small to completely characterize thought to be a cyst at time of real-time imaging. Increased cortical echogenicity. Bladder: Limited evaluation due to body habitus. There is a rounded hypoechoic/anechoic mass with calcifications adjacent to the  right side of the bladder measuring 5.3 x 4.8 x 4.5 cm. IMPRESSION: 1. Increased cortical echogenicity in both kidneys consistent with medical renal disease. 2. There is a hypoechoic/anechoic mass with calcifications adjacent to right side of the bladder. This mass measures 5.3 x 4.8 x 4.5 cm. It is possible this mass could represent a bladder diverticulum containing bladder stones. A mass adjacent to the bladder is possible. Recommend a CT scan for better evaluation 3. A 1.3 cm hypoechoic mass in the left kidney is too small to characterize and nonspecific. While this may represent a mildly complicated cyst, solid mass is not completely excluded. Recommend attention to this region on the recommended CT scan to evaluate the pelvis. Electronically Signed   By: Dorise Bullion III M.D   On: 06/16/2019 12:06   Dg Chest Port 1 View  Result Date: 06/15/2019 CLINICAL DATA:  Unsuccessful left IJ placement attempt, concern for pneumothorax EXAM: PORTABLE CHEST 1 VIEW COMPARISON:  06/15/2019 FINDINGS: No significant pneumothorax is appreciated. No vascular catheter or catheter fragment appreciated in the chest. Cardiomegaly with unchanged pulmonary vascular prominence and probable mild edema. IMPRESSION: No significant pneumothorax is appreciated. No vascular catheter or catheter fragment appreciated in the chest. Cardiomegaly with unchanged pulmonary vascular prominence and probable mild edema. Electronically Signed   By: Eddie Candle M.D.   On: 06/15/2019 21:51     Medications:   . sodium chloride    . pureflow 2,000 mL/hr at 06/17/19 1551   . amLODipine  5 mg Oral Daily  . atorvastatin  40 mg Oral QHS  . carvedilol  25 mg Oral Q12H  . cholecalciferol  2,000 Units Oral Daily  . heparin  5,000 Units Subcutaneous Q8H  . hydrALAZINE  25 mg Oral Q8H  . hydrocerin   Topical TID  . insulin aspart  0-15 Units Subcutaneous TID WC  . insulin aspart  0-5 Units Subcutaneous QHS  . insulin glargine  20 Units  Subcutaneous Daily  . isosorbide  mononitrate  60 mg Oral Daily  . mouth rinse  15 mL Mouth Rinse BID  . sodium chloride flush  3 mL Intravenous Q12H  . umeclidinium-vilanterol  1 puff Inhalation Daily   sodium chloride, acetaminophen **OR** acetaminophen, albuterol, bisacodyl, heparin, HYDROcodone-acetaminophen, ondansetron **OR** ondansetron (ZOFRAN) IV, senna-docusate, sodium chloride flush, traZODone  Assessment/ Plan:  53 y.o. male with a PMHx of Bell's palsy, congestive heart failure, diabetes mellitus type 2, hypertension, obstructive sleep apnea, psoriasis, pulmonary hypertension, chronic kidney disease stage II, who was admitted to [facility] on 06/15/2019 for evaluation of severe acute renal failure in the setting of generalized weakness.   1.  Acute renal failure/chronic kidney disease stage II Baseline EGFR 64 with suspected dehydration/diabetes mellitus type 2 with chronic kidney disease. -Patient continues to have anuria at the moment.  Awaiting additional serologic work-up.  Maintain the patient on CRRT.  We added ultrafiltration of 50 cc/h.  Continue to monitor serum electrolytes closely as well.  Further plan as patient progresses.   LOS: 2 [name] 7/1/20203:56 PM

## 2019-06-17 NOTE — Progress Notes (Signed)
Per Dr Holley Raring holding BP medication. Prefer's for patients BP to be elevated. Continue to monitor.

## 2019-06-17 NOTE — Progress Notes (Signed)
CRITICAL CARE NOTE      CHIEF COMPLAINT:   Acute Kidney Injury and severe hyperkalemia   HPI   This is a pleasant 53 year old male with a history of diabetes, essential hypertension, psoriasis, obstructive sleep apnea, congestive heart failure Bell's palsy who came in due to weakness and worsening lower extremity and abdominal edema.  Patient states that he has not been compliant with his diabetic and hypertensive regimen.  In the ED patient was found to have acute kidney injury with creatinine over 16 and hyperkalemia with a K over 7.  Hospitalist had upgraded patient to medical intensive care unit for urgent hemodialysis after discussion with nephrologist.    Hypoxemia resolved CKD imporved post HD Mentation has improved significantly.  Will optimize for downgrade to medical floor.    PAST MEDICAL HISTORY   Past Medical History:  Diagnosis Date  . Bell's palsy   . CHF (congestive heart failure) ([place])   . Chronic kidney disease   . Diabetes mellitus without complication ([place])   . Hypertension   . NSVT (nonsustained ventricular tachycardia) ([place])   . Obstructive sleep apnea   . Psoriasis   . Pulmonary HTN ([place])      SURGICAL HISTORY   Past Surgical History:  Procedure Laterality Date  . CORONARY ANGIOPLASTY WITH STENT PLACEMENT    . NO PAST SURGERIES       FAMILY HISTORY   Family History  Problem Relation Age of Onset  . Heart failure Mother   . Kidney failure Brother      SOCIAL HISTORY   Social History   Tobacco Use  . Smoking status: Never Smoker  . Smokeless tobacco: Never Used  Substance Use Topics  . Alcohol use: Yes    Alcohol/week: 2.0 standard drinks    Types: 1 Shots of liquor, 1 Cans of beer per week    Comment: very rarely 1-2 times per month  . Drug use: No     MEDICATIONS   Current Medication:  Current Facility-Administered Medications:  .  0.9 %  sodium chloride infusion, 250 mL, Intravenous, PRN, Demetrios Loll, MD .  acetaminophen (TYLENOL) tablet 650 mg, 650 mg, Oral, Q6H PRN **OR** acetaminophen (TYLENOL) suppository 650 mg, 650 mg, Rectal, Q6H PRN, Demetrios Loll, MD .  albuterol (VENTOLIN HFA) 108 (90 Base) MCG/ACT inhaler 2 puff, 2 puff, Inhalation, Q4H PRN, Demetrios Loll, MD .  amLODipine (NORVASC) tablet 5 mg, 5 mg, Oral, Daily, Demetrios Loll, MD, 5 mg at 06/15/19 1856 .  atorvastatin (LIPITOR) tablet 40 mg, 40 mg, Oral, QHS, Demetrios Loll, MD, 40 mg at 06/16/19 2126 .  bisacodyl (DULCOLAX) EC tablet 5 mg, 5 mg, Oral, Daily PRN, Demetrios Loll, MD .  carvedilol (COREG) tablet 25 mg, 25 mg, Oral, Q12H, Demetrios Loll, MD, Stopped at 06/15/19 2220 .  cholecalciferol (VITAMIN D3) tablet 2,000 Units, 2,000 Units, Oral, Daily, Charlett Nose, RPH, 2,000 Units at 06/16/19 2831 .  heparin injection 1,000-6,000 Units, 1,000-6,000 Units, CRRT, PRN, Awilda Bill, NP .  heparin injection 5,000 Units, 5,000 Units, Subcutaneous, Q8H, Demetrios Loll, MD, 5,000 Units at 06/17/19 0530 .  hydrALAZINE (APRESOLINE) tablet 25 mg, 25 mg, Oral, Q8H, Demetrios Loll, MD, Stopped at 06/15/19 2220 .  hydrocerin (EUCERIN) cream, , Topical, TID, Ottie Glazier, MD .  HYDROcodone-acetaminophen (NORCO/VICODIN) 5-325 MG per tablet 1-2 tablet, 1-2 tablet, Oral, Q4H PRN, Demetrios Loll, MD, 1 tablet at 06/17/19 0330 .  insulin aspart (novoLOG) injection 0-15 Units, 0-15 Units, Subcutaneous, TID WC, Aleskerov,  Fuad, MD, 5 Units at 06/16/19 1621 .  insulin aspart (novoLOG) injection 0-5 Units, 0-5 Units, Subcutaneous, QHS, Aleskerov, Fuad, MD .  insulin glargine (LANTUS) injection 20 Units, 20 Units, Subcutaneous, Daily, Ottie Glazier, MD, 20 Units at 06/16/19 1310 .  isosorbide mononitrate (IMDUR) 24 hr tablet 60 mg, 60 mg, Oral, Daily, Demetrios Loll, MD, 60 mg at 06/15/19 1857 .  MEDLINE mouth rinse,  15 mL, Mouth Rinse, BID, Aleskerov, Fuad, MD, 15 mL at 06/16/19 2127 .  ondansetron (ZOFRAN) tablet 4 mg, 4 mg, Oral, Q6H PRN **OR** ondansetron (ZOFRAN) injection 4 mg, 4 mg, Intravenous, Q6H PRN, Demetrios Loll, MD .  pureflow IV solution for Dialysis, , CRRT, Continuous, [name] D, NP, Last Rate: 2,000 mL/hr at 06/17/19 0437 .  senna-docusate (Senokot-S) tablet 1 tablet, 1 tablet, Oral, QHS PRN, Demetrios Loll, MD .  sodium chloride flush (NS) 0.9 % injection 3 mL, 3 mL, Intravenous, Q12H, Demetrios Loll, MD, 3 mL at 06/16/19 2128 .  sodium chloride flush (NS) 0.9 % injection 3 mL, 3 mL, Intravenous, PRN, Demetrios Loll, MD .  traZODone (DESYREL) tablet 25-50 mg, 25-50 mg, Oral, QHS PRN, Demetrios Loll, MD .  umeclidinium-vilanterol [facility] ELLIPTA) 62.5-25 MCG/INH 1 puff, 1 puff, Inhalation, Daily, Demetrios Loll, MD, 1 puff at 06/16/19 3734    ALLERGIES   Shellfish allergy    REVIEW OF SYSTEMS    10 point ROS conducted is negative except for hunger  PHYSICAL EXAMINATION   Vitals:   06/17/19 0600 06/17/19 0700  BP: 133/72 (!) 147/75  Pulse: 91 93  Resp: 10 10  Temp:  98.3 F (36.8 C)  SpO2: 97% 98%    GENERAL: Mild distress due to shortness of breath and lower extremity pain HEAD: Normocephalic, atraumatic.  EYES: Pupils equal, round, reactive to light.  No scleral icterus.  MOUTH: Moist mucosal membrane. NECK: Supple. No thyromegaly. No nodules. No JVD.  PULMONARY: Decreased breath sounds bilaterally CARDIOVASCULAR: S1 and S2. Regular rate and rhythm. No murmurs, rubs, or gallops.  GASTROINTESTINAL: Soft, nontender, non-distended. No masses. Positive bowel sounds. No hepatosplenomegaly.  MUSCULOSKELETAL: No swelling, clubbing, or 3+ edema NEUROLOGIC: Mild distress due to acute illness SKIN:intact,warm,dry   LABS AND IMAGING     LAB RESULTS: Recent Labs  Lab 06/16/19 2202  06/17/19 0109 06/17/19 0209 06/17/19 0526  NA 134*  --   --  134* 136  K 5.4*   < > 5.1 4.9  5.0  4.8  CL 98  --   --  100 100  CO2 25  --   --  25 25  BUN 57*  --   --  52* 49*  CREATININE 8.30*  --   --  7.90* 7.41*  GLUCOSE 126*  --   --  121* 124*   < > = values in this interval not displayed.   Recent Labs  Lab 06/15/19 1348 06/16/19 0223 06/17/19 0526  HGB 9.8* 9.1* 9.6*  HCT 32.7* 30.0* 32.0*  WBC 12.8* 12.8* 11.8*  PLT 236 233 201     IMAGING RESULTS: US Renal  Result Date: 06/16/2019 CLINICAL DATA:  Renal failure. EXAM: RENAL / URINARY TRACT ULTRASOUND COMPLETE COMPARISON:  None. FINDINGS: Right Kidney: Renal measurements: 11.7 x 5.9 x 5 6 cm = volume: 201 mL. Increased cortical echogenicity. Left Kidney: Renal measurements: 11.9 x 7.4 x 7.3 cm = volume: 336.2 mL. Contains a 1.3 cm mass, too small to completely characterize thought to be a cyst at time of real-time imaging.  Increased cortical echogenicity. Bladder: Limited evaluation due to body habitus. There is a rounded hypoechoic/anechoic mass with calcifications adjacent to the right side of the bladder measuring 5.3 x 4.8 x 4.5 cm. IMPRESSION: 1. Increased cortical echogenicity in both kidneys consistent with medical renal disease. 2. There is a hypoechoic/anechoic mass with calcifications adjacent to right side of the bladder. This mass measures 5.3 x 4.8 x 4.5 cm. It is possible this mass could represent a bladder diverticulum containing bladder stones. A mass adjacent to the bladder is possible. Recommend a CT scan for better evaluation 3. A 1.3 cm hypoechoic mass in the left kidney is too small to characterize and nonspecific. While this may represent a mildly complicated cyst, solid mass is not completely excluded. Recommend attention to this region on the recommended CT scan to evaluate the pelvis. 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 with noncompliance -Nephrology on case-appreciate input following recommendations -Emergent HD -follow chem 7 -follow UO -continue Foley Catheter-assess need daily    Obstructive sleep apnea -May provide CPAP with home settings as per RT   GI/Nutrition GI PROPHYLAXIS as indicated DIET-->TF's as tolerated Constipation protocol as indicated  ENDO - ICU hypoglycemic\Hyperglycemia protocol -check FSBS per protocol   ELECTROLYTES -follow labs as needed -replace as needed -pharmacy consultation   DVT/GI PRX ordered -SCDs  TRANSFUSIONS AS NEEDED MONITOR FSBS ASSESS the need for LABS as needed   Critical care provider statement:    Critical care time (minutes):  32   Critical care time was exclusive of:  Separately billable procedures and treating other patients   Critical care was necessary to treat or prevent imminent or life-threatening deterioration of the following conditions:   Severe hyperkalemia, acute on chronic renal insufficiency stage IV, morbid obesity, pulmonary hypertension, obstructive sleep apnea   Critical care was time spent personally by me on the following activities:  Development of treatment plan with patient or surrogate, discussions with consultants, evaluation of patient's response to treatment, examination of patient, obtaining history from patient or surrogate, ordering and performing treatments and interventions, ordering and review of laboratory studies and re-evaluation of patient's condition.  I assumed direction of critical care for this patient from another provider in my specialty: no     This document was prepared using Dragon voice recognition software and may include unintentional dictation errors.    Ottie Glazier, M.D.  Division of [place]

## 2019-06-17 NOTE — Progress Notes (Signed)
CRRT Medication Adjustments:   Pharmacy consulted for CRRT medication adjustments for 53 yo male requiring CRRT. No adjustments warranted at this time. Will continue to monitor.   Currie Paris, [place]  06.30.2020 [phone]

## 2019-06-18 ENCOUNTER — Inpatient Hospital Stay: Payer: Medicare Other

## 2019-06-18 LAB — RENAL FUNCTION PANEL
Albumin: 2.2 g/dL — ABNORMAL LOW (ref 3.5–5.0)
Albumin: 2.2 g/dL — ABNORMAL LOW (ref 3.5–5.0)
Albumin: 2.1 g/dL — ABNORMAL LOW (ref 3.5–5.0)
Albumin: 2.1 g/dL — ABNORMAL LOW (ref 3.5–5.0)
Albumin: 2.1 g/dL — ABNORMAL LOW (ref 3.5–5.0)
Albumin: 2 g/dL — ABNORMAL LOW (ref 3.5–5.0)
Anion gap: 9 (ref 5–15)
Anion gap: 10 (ref 5–15)
Anion gap: 10 (ref 5–15)
Anion gap: 9 (ref 5–15)
Anion gap: 9 (ref 5–15)
Anion gap: 8 (ref 5–15)
BUN: 33 mg/dL — ABNORMAL HIGH (ref 6–20)
BUN: 30 mg/dL — ABNORMAL HIGH (ref 6–20)
BUN: 28 mg/dL — ABNORMAL HIGH (ref 6–20)
BUN: 25 mg/dL — ABNORMAL HIGH (ref 6–20)
BUN: 23 mg/dL — ABNORMAL HIGH (ref 6–20)
BUN: 23 mg/dL — ABNORMAL HIGH (ref 6–20)
CO2: 25 mmol/L (ref 22–32)
CO2: 25 mmol/L (ref 22–32)
CO2: 25 mmol/L (ref 22–32)
CO2: 25 mmol/L (ref 22–32)
CO2: 26 mmol/L (ref 22–32)
CO2: 26 mmol/L (ref 22–32)
Calcium: 8.4 mg/dL — ABNORMAL LOW (ref 8.9–10.3)
Calcium: 8.4 mg/dL — ABNORMAL LOW (ref 8.9–10.3)
Calcium: 8.3 mg/dL — ABNORMAL LOW (ref 8.9–10.3)
Calcium: 8.3 mg/dL — ABNORMAL LOW (ref 8.9–10.3)
Calcium: 8.6 mg/dL — ABNORMAL LOW (ref 8.9–10.3)
Calcium: 8.5 mg/dL — ABNORMAL LOW (ref 8.9–10.3)
Chloride: 102 mmol/L (ref 98–111)
Chloride: 101 mmol/L (ref 98–111)
Chloride: 101 mmol/L (ref 98–111)
Chloride: 100 mmol/L (ref 98–111)
Chloride: 101 mmol/L (ref 98–111)
Chloride: 101 mmol/L (ref 98–111)
Creatinine, Ser: 5.17 mg/dL — ABNORMAL HIGH (ref 0.61–1.24)
Creatinine, Ser: 4.6 mg/dL — ABNORMAL HIGH (ref 0.61–1.24)
Creatinine, Ser: 4.55 mg/dL — ABNORMAL HIGH (ref 0.61–1.24)
Creatinine, Ser: 4.29 mg/dL — ABNORMAL HIGH (ref 0.61–1.24)
Creatinine, Ser: 3.73 mg/dL — ABNORMAL HIGH (ref 0.61–1.24)
Creatinine, Ser: 3.87 mg/dL — ABNORMAL HIGH (ref 0.61–1.24)
GFR calc Af Amer: 14 mL/min — ABNORMAL LOW (ref >60)
GFR calc Af Amer: 16 mL/min — ABNORMAL LOW (ref >60)
GFR calc Af Amer: 16 mL/min — ABNORMAL LOW (ref >60)
GFR calc Af Amer: 17 mL/min — ABNORMAL LOW (ref >60)
GFR calc Af Amer: 21 mL/min — ABNORMAL LOW (ref >60)
GFR calc Af Amer: 20 mL/min — ABNORMAL LOW (ref >60)
GFR calc non Af Amer: 12 mL/min — ABNORMAL LOW (ref >60)
GFR calc non Af Amer: 14 mL/min — ABNORMAL LOW (ref >60)
GFR calc non Af Amer: 14 mL/min — ABNORMAL LOW (ref >60)
GFR calc non Af Amer: 15 mL/min — ABNORMAL LOW (ref >60)
GFR calc non Af Amer: 18 mL/min — ABNORMAL LOW (ref >60)
GFR calc non Af Amer: 17 mL/min — ABNORMAL LOW (ref >60)
Glucose, Bld: 111 mg/dL — ABNORMAL HIGH (ref 70–99)
Glucose, Bld: 110 mg/dL — ABNORMAL HIGH (ref 70–99)
Glucose, Bld: 111 mg/dL — ABNORMAL HIGH (ref 70–99)
Glucose, Bld: 103 mg/dL — ABNORMAL HIGH (ref 70–99)
Glucose, Bld: 94 mg/dL (ref 70–99)
Glucose, Bld: 88 mg/dL (ref 70–99)
Phosphorus: 4.9 mg/dL — ABNORMAL HIGH (ref 2.5–4.6)
Phosphorus: 4.5 mg/dL (ref 2.5–4.6)
Phosphorus: 4.5 mg/dL (ref 2.5–4.6)
Phosphorus: 3.9 mg/dL (ref 2.5–4.6)
Phosphorus: 3.4 mg/dL (ref 2.5–4.6)
Phosphorus: 3.5 mg/dL (ref 2.5–4.6)
Potassium: 4.4 mmol/L (ref 3.5–5.1)
Potassium: 4.2 mmol/L (ref 3.5–5.1)
Potassium: 4.2 mmol/L (ref 3.5–5.1)
Potassium: 3.9 mmol/L (ref 3.5–5.1)
Potassium: 3.7 mmol/L (ref 3.5–5.1)
Potassium: 3.7 mmol/L (ref 3.5–5.1)
Sodium: 136 mmol/L (ref 135–145)
Sodium: 136 mmol/L (ref 135–145)
Sodium: 136 mmol/L (ref 135–145)
Sodium: 134 mmol/L — ABNORMAL LOW (ref 135–145)
Sodium: 136 mmol/L (ref 135–145)
Sodium: 135 mmol/L (ref 135–145)

## 2019-06-18 LAB — BLOOD GAS, ARTERIAL
Acid-Base Excess: 0.5 mmol/L (ref 0.0–2.0)
Acid-base deficit: 2.1 mmol/L — ABNORMAL HIGH (ref 0.0–2.0)
Bicarbonate: 26 mmol/L (ref 20.0–28.0)
Bicarbonate: 28.4 mmol/L — ABNORMAL HIGH (ref 20.0–28.0)
Expiratory PAP: 10
Expiratory PAP: 5
FIO2: 0.4
FIO2: 0.4
Inspiratory PAP: 20
Mode: POSITIVE
O2 Saturation: 92.2 %
O2 Saturation: 96.3 %
Patient temperature: 37
Patient temperature: 37
RATE: 12 resp/min
pCO2 arterial: 58 mmHg — ABNORMAL HIGH (ref 32.0–48.0)
pCO2 arterial: 59 mmHg — ABNORMAL HIGH (ref 32.0–48.0)
pH, Arterial: 7.26 — ABNORMAL LOW (ref 7.350–7.450)
pH, Arterial: 7.29 — ABNORMAL LOW (ref 7.350–7.450)
pO2, Arterial: 74 mmHg — ABNORMAL LOW (ref 83.0–108.0)
pO2, Arterial: 93 mmHg (ref 83.0–108.0)

## 2019-06-18 LAB — PROTEIN ELECTROPHORESIS, SERUM
A/G Ratio: 0.5 — ABNORMAL LOW (ref 0.7–1.7)
Albumin ELP: 2.2 g/dL — ABNORMAL LOW (ref 2.9–4.4)
Alpha-1-Globulin: 0.5 g/dL — ABNORMAL HIGH (ref 0.0–0.4)
Alpha-2-Globulin: 1.2 g/dL — ABNORMAL HIGH (ref 0.4–1.0)
Beta Globulin: 1 g/dL (ref 0.7–1.3)
Gamma Globulin: 1.4 g/dL (ref 0.4–1.8)
Globulin, Total: 4.1 g/dL — ABNORMAL HIGH (ref 2.2–3.9)
Total Protein ELP: 6.3 g/dL (ref 6.0–8.5)

## 2019-06-18 LAB — MAGNESIUM
Magnesium: 1.8 mg/dL (ref 1.7–2.4)
Magnesium: 1.7 mg/dL (ref 1.7–2.4)
Magnesium: 1.9 mg/dL (ref 1.7–2.4)
Magnesium: 2.2 mg/dL (ref 1.7–2.4)
Magnesium: 2 mg/dL (ref 1.7–2.4)

## 2019-06-18 LAB — GLOMERULAR BASEMENT MEMBRANE ANTIBODIES: GBM Ab: 9 units (ref 0–20)

## 2019-06-18 LAB — GLUCOSE, CAPILLARY
Glucose-Capillary: 99 mg/dL (ref 70–99)
Glucose-Capillary: 97 mg/dL (ref 70–99)
Glucose-Capillary: 86 mg/dL (ref 70–99)
Glucose-Capillary: 72 mg/dL (ref 70–99)

## 2019-06-18 LAB — C3 COMPLEMENT: C3 Complement: 180 mg/dL — ABNORMAL HIGH (ref 82–167)

## 2019-06-18 LAB — CBC
HCT: 31.9 % — ABNORMAL LOW (ref 39.0–52.0)
Hemoglobin: 9.5 g/dL — ABNORMAL LOW (ref 13.0–17.0)
MCH: 23.5 pg — ABNORMAL LOW (ref 26.0–34.0)
MCHC: 29.8 g/dL — ABNORMAL LOW (ref 30.0–36.0)
MCV: 79 fL — ABNORMAL LOW (ref 80.0–100.0)
Platelets: 200 10*3/uL (ref 150–400)
RBC: 4.04 MIL/uL — ABNORMAL LOW (ref 4.22–5.81)
RDW: 18.9 % — ABNORMAL HIGH (ref 11.5–15.5)
WBC: 14.4 10*3/uL — ABNORMAL HIGH (ref 4.0–10.5)
nRBC: 0.2 % (ref 0.0–0.2)

## 2019-06-18 LAB — PTT FACTOR INHIBITOR (MIXING STUDY): aPTT: 31.8 s — ABNORMAL HIGH (ref 22.9–30.2)

## 2019-06-18 LAB — HEMOGLOBIN A1C
Hgb A1c MFr Bld: 14.7 % — ABNORMAL HIGH (ref 4.8–5.6)
Mean Plasma Glucose: 375.19 mg/dL

## 2019-06-18 LAB — ANA W/REFLEX IF POSITIVE: Anti Nuclear Antibody (ANA): NEGATIVE

## 2019-06-18 LAB — MPO/PR-3 (ANCA) ANTIBODIES
ANCA Proteinase 3: <3.5 U/mL (ref 0.0–3.5)
Myeloperoxidase Abs: <9 U/mL (ref 0.0–9.0)

## 2019-06-18 LAB — APTT: aPTT: 37 seconds — ABNORMAL HIGH (ref 24–36)

## 2019-06-18 LAB — C4 COMPLEMENT: Complement C4, Body Fluid: 85 mg/dL — ABNORMAL HIGH (ref 14–44)

## 2019-06-18 LAB — PARATHYROID HORMONE, INTACT (NO CA): PTH: 369 pg/mL — ABNORMAL HIGH (ref 15–65)

## 2019-06-18 MED ORDER — ORAL CARE MOUTH RINSE
15.0000 mL | Freq: Two times a day (BID) | OROMUCOSAL | Status: DC
  Administered 2019-06-18 – 2019-06-19 (×3): 15 mL via OROMUCOSAL

## 2019-06-18 MED ORDER — MAGNESIUM SULFATE 2 GM/50ML IV SOLN
2.0000 g | Freq: Once | INTRAVENOUS | Status: AC
  Administered 2019-06-18: 2 g via INTRAVENOUS
  Filled 2019-06-18: qty 50

## 2019-06-18 MED ORDER — BISACODYL 10 MG RE SUPP
10.0000 mg | Freq: Every day | RECTAL | Status: DC | PRN
  Filled 2019-06-18: qty 1

## 2019-06-18 MED ORDER — CHLORHEXIDINE GLUCONATE 0.12 % MT SOLN
15.0000 mL | Freq: Two times a day (BID) | OROMUCOSAL | Status: DC
  Administered 2019-06-18 – 2019-06-21 (×5): 15 mL via OROMUCOSAL
  Filled 2019-06-18 (×6): qty 15

## 2019-06-18 NOTE — Progress Notes (Signed)
CRRT filter clotted off, unable to rinse back patients blood. New cartridge started without complications. Order for CBC placed with morning labs. Will continue to monitor.

## 2019-06-18 NOTE — Progress Notes (Signed)
CRRT Medication Adjustments:   Pharmacy consulted for CRRT medication adjustments for 53 yo male requiring CRRT. No adjustments warranted at this time.   Magnesium 2g IV x 1 given.   Will continue to monitor.   Currie Paris, [place]  06.30.2020 [phone]

## 2019-06-18 NOTE — Progress Notes (Signed)
[facility]  ROUNDING NOTE   Subjective:  Patient seen at bedside. Remains critically ill. Currently on BiPAP. Azotemia slowly improving.   Objective:  Vital signs in last 24 hours:  Temp:  [97.4 F (36.3 C)-98.7 F (37.1 C)] 98.7 F (37.1 C) (07/02 1200) Pulse Rate:  [92-102] 93 (07/02 1200) Resp:  [10-30] 14 (07/02 1200) BP: (135-177)/(80-117) 150/84 (07/02 1200) SpO2:  [89 %-99 %] 97 % (07/02 1200) FiO2 (%):  [40 %] 40 % (07/02 1200) Weight:  [127.3 kg] 127.3 kg (07/02 0424)  Weight change: -2.1 kg Filed Weights   06/15/19 1319 06/17/19 0500 06/18/19 0424  Weight: 122.5 kg 129.4 kg 127.3 kg    Intake/Output: I/O last 3 completed shifts: In: 240 [P.O.:240] Out: 958 [Other:958]   Intake/Output this shift:  Total I/O In: 3 [I.V.:3] Out: 277 [Other:277]  Physical Exam: General: Currently on BiPAP  Head: Normocephalic, atraumatic. Moist oral mucosal membranes  Eyes: Anicteric  Neck: Supple, trachea midline  Lungs:  Scattered rhonchi, on BiPAP  Heart: S1S2 no rubs  Abdomen:  Soft, nontender, bowel sounds present  Extremities: 2+ peripheral edema.  Neurologic: Lethargic but arousable  Skin: No lesions  Access: Left femoral vein dialysis catheter    Basic Metabolic Panel: Recent Labs  Lab 06/17/19 1828 06/17/19 1829 06/17/19 2233 06/18/19 0232 06/18/19 0616 06/18/19 1034  NA 136  --  136 136 136 136  K 4.2  --  4.4 4.4 4.2 4.2  CL 99  --  103 102 101 101  CO2 27  --  25 25 25 25  GLUCOSE 105*  --  114* 111* 110* 111*  BUN 37*  --  37* 33* 30* 28*  CREATININE 5.93*  --  5.87* 5.17* 4.60* 4.55*  CALCIUM 8.2*  --  8.0* 8.4* 8.4* 8.3*  MG  --  1.9 1.6* 1.8 1.7 1.9  PHOS 5.1*  --  5.5* 4.9* 4.5 4.5    Liver Function Tests: Recent Labs  Lab 06/15/19 1348  06/17/19 1828 06/17/19 2233 06/18/19 0232 06/18/19 0616 06/18/19 1034  AST 12*  --   --   --   --   --   --   ALT 11  --   --   --   --   --   --   ALKPHOS 140*  --   --   --    --   --   --   BILITOT 0.6  --   --   --   --   --   --   PROT 6.7  --   --   --   --   --   --   ALBUMIN 2.4*   < > 2.2* 2.0* 2.2* 2.2* 2.1*   < > = values in this interval not displayed.   No results for input(s): LIPASE, AMYLASE in the last 168 hours. No results for input(s): AMMONIA in the last 168 hours.  CBC: Recent Labs  Lab 06/15/19 1348 06/16/19 0223 06/17/19 0526 06/18/19 0420  WBC 12.8* 12.8* 11.8* 14.4*  NEUTROABS 9.8*  --   --   --   HGB 9.8* 9.1* 9.6* 9.5*  HCT 32.7* 30.0* 32.0* 31.9*  MCV 78.4* 77.3* 78.8* 79.0*  PLT 236 233 201 200    Cardiac Enzymes: No results for input(s): CKTOTAL, CKMB, CKMBINDEX, TROPONINI in the last 168 hours.  BNP: Invalid input(s): POCBNP  CBG: Recent Labs  Lab 06/17/19 1228 06/17/19 1610 06/17/19 2122 06/18/19 0714 06/18/19   [place]    Microbiology: Results for orders placed or performed during the hospital encounter of 06/15/19  Blood culture (routine x 2)     Status: None (Preliminary result)   Collection Time: 06/15/19  1:48 PM   Specimen: BLOOD  Result Value Ref Range Status   Specimen Description BLOOD BLOOD LEFT ARM  Final   Special Requests   Final    BOTTLES DRAWN AEROBIC AND ANAEROBIC Blood Culture adequate volume   Culture   Final    NO GROWTH 3 DAYS Performed at [facility], [address] [place], [place] [address]    Report Status PENDING  Incomplete  SARS Coronavirus 2 (CEPHEID- Performed in [place] hospital lab), Hosp Order     Status: None   Collection Time: 06/15/19  1:57 PM   Specimen: Nasopharyngeal Swab  Result Value Ref Range Status   SARS Coronavirus 2 NEGATIVE NEGATIVE Final    Comment: (NOTE) If result is NEGATIVE SARS-CoV-2 target nucleic acids are NOT DETECTED. The SARS-CoV-2 RNA is generally detectable in upper and lower  respiratory specimens during the acute phase of infection. The lowest  concentration of SARS-CoV-2 viral copies this assay  can detect is 250  copies / mL. A negative result does not preclude SARS-CoV-2 infection  and should not be used as the sole basis for treatment or other  patient management decisions.  A negative result may occur with  improper specimen collection / handling, submission of specimen other  than nasopharyngeal swab, presence of viral mutation(s) within the  areas targeted by this assay, and inadequate number of viral copies  (<250 copies / mL). A negative result must be combined with clinical  observations, patient history, and epidemiological information. If result is POSITIVE SARS-CoV-2 target nucleic acids are DETECTED. The SARS-CoV-2 RNA is generally detectable in upper and lower  respiratory specimens dur ing the acute phase of infection.  Positive  results are indicative of active infection with SARS-CoV-2.  Clinical  correlation with patient history and other diagnostic information is  necessary to determine patient infection status.  Positive results do  not rule out bacterial infection or co-infection with other viruses. If result is PRESUMPTIVE POSTIVE SARS-CoV-2 nucleic acids MAY BE PRESENT.   A presumptive positive result was obtained on the submitted specimen  and confirmed on repeat testing.  While 2019 novel coronavirus  (SARS-CoV-2) nucleic acids may be present in the submitted sample  additional confirmatory testing may be necessary for epidemiological  and / or clinical management purposes  to differentiate between  SARS-CoV-2 and other Sarbecovirus currently known to infect humans.  If clinically indicated additional testing with an alternate test  methodology [phone]) is advised. The SARS-CoV-2 RNA is generally  detectable in upper and lower respiratory sp ecimens during the acute  phase of infection. The expected result is Negative. Fact Sheet for Patients:  StrictlyIdeas.no Fact Sheet for Healthcare  Providers: BankingDealers.co.za This test is not yet approved or cleared by the [place] FDA and has been authorized for detection and/or diagnosis of SARS-CoV-2 by FDA under an Emergency Use Authorization (EUA).  This EUA will remain in effect (meaning this test can be used) for the duration of the COVID-19 declaration under Section 564(b)(1) of the Act, 21 U.S.C. section 360bbb-3(b)(1), unless the authorization is terminated or revoked sooner. Performed at [facility], [address] [place], [place] [address]   Blood culture (routine x 2)     Status:  None (Preliminary result)   Collection Time: 06/15/19  4:40 PM   Specimen: BLOOD  Result Value Ref Range Status   Specimen Description BLOOD BLOOD RIGHT ARM  Final   Special Requests   Final    BOTTLES DRAWN AEROBIC AND ANAEROBIC Blood Culture adequate volume   Culture   Final    NO GROWTH 3 DAYS Performed at [facility], [address] [place], [place] [address]    Report Status PENDING  Incomplete    Coagulation Studies: Recent Labs    06/15/19 1907  LABPROT 14.3  INR 1.1    Urinalysis: No results for input(s): COLORURINE, LABSPEC, PHURINE, GLUCOSEU, HGBUR, BILIRUBINUR, KETONESUR, PROTEINUR, UROBILINOGEN, NITRITE, LEUKOCYTESUR in the last 72 hours.  Invalid input(s): APPERANCEUR    Imaging: Dg Chest Port 1 View  Result Date: 06/18/2019 CLINICAL DATA:  Acute respiratory failure EXAM: PORTABLE CHEST 1 VIEW COMPARISON:  06/15/2019 FINDINGS: Cardiac shadow is enlarged but stable. No pneumothorax is noted. Mild central vascular congestion is seen increased from the prior exam. No focal infiltrate is noted. No bony abnormality is seen. IMPRESSION: Mild vascular congestion. Electronically Signed   By: Mark  Lukens M.D.   On: 06/18/2019 10:58     Medications:   . sodium chloride    . pureflow 2,000 mL/hr at 06/18/19 0400   . amLODipine  5 mg Oral Daily  . atorvastatin  40  mg Oral QHS  . carvedilol  25 mg Oral Q12H  . chlorhexidine  15 mL Mouth Rinse BID  . cholecalciferol  2,000 Units Oral Daily  . heparin  5,000 Units Subcutaneous Q8H  . hydrALAZINE  25 mg Oral Q8H  . hydrocerin   Topical TID  . insulin aspart  0-15 Units Subcutaneous TID WC  . insulin aspart  0-5 Units Subcutaneous QHS  . insulin glargine  20 Units Subcutaneous Daily  . isosorbide mononitrate  60 mg Oral Daily  . mouth rinse  15 mL Mouth Rinse BID  . mouth rinse  15 mL Mouth Rinse q12n4p  . sodium chloride flush  3 mL Intravenous Q12H  . umeclidinium-vilanterol  1 puff Inhalation Daily   sodium chloride, acetaminophen **OR** acetaminophen, albuterol, bisacodyl, heparin, HYDROcodone-acetaminophen, ondansetron **OR** ondansetron (ZOFRAN) IV, senna-docusate, sodium chloride flush, traZODone  Assessment/ Plan:  52 y.o. male with a PMHx of Bell's palsy, congestive heart failure, diabetes mellitus type 2, hypertension, obstructive sleep apnea, psoriasis, pulmonary hypertension, chronic kidney disease stage II, who was admitted to [facility] on 06/15/2019 for evaluation of severe acute renal failure in the setting of generalized weakness.   1.  Acute renal failure/chronic kidney disease stage II Baseline EGFR 64 with suspected dehydration/diabetes mellitus type 2 with chronic kidney disease. -Patient continues to have anuria.  He has been tolerating CRRT well.  We will increase ultrafiltration target of 75 cc/h and continue to monitor serum electrolytes closely.  Consider transitioning to dialysis if he stabilizes a bit further.  2.  Acute respiratory failure.  Patient currently maintained on BiPAP.  PCO2 noted to be a bit high at 58.   LOS: 3 Munsoor Lateef 7/2/20201:45 PM   

## 2019-06-18 NOTE — Progress Notes (Signed)
[place] [facility] [place] NAME: [name]    MR#:  [id number]  DATE OF BIRTH:  02/20/1966  SUBJECTIVE:   The patient is confused, on BiPAP.  Still oliguria, on CRRT. REVIEW OF SYSTEMS:    Review of Systems  Unable to perform ROS: Mental acuity    Nutrition: Renal/Carb modified Tolerating Diet: yes Tolerating PT: Await Eval.   DRUG ALLERGIES:   Allergies  Allergen Reactions  . Shellfish Allergy Anaphylaxis    Face and throat swelling, difficulty breathing Allergy can be triggered by touching (contact)    VITALS:  Blood pressure (!) 148/86, pulse 89, temperature 98.7 F (37.1 C), temperature source Axillary, resp. rate 15, height 5\' 3"  (1.6 m), weight 127.3 kg, SpO2 94 %.  PHYSICAL EXAMINATION:   Physical Exam  GENERAL:  53 y.o.-year-old obese patient lying in bed in no acute distress.  Morbid obesity. EYES: Pupils equal, round, reactive to light and accommodation. No scleral icterus. Extraocular muscles intact.  HEENT: Head atraumatic, normocephalic. Oropharynx and nasopharynx clear.  NECK:  Supple, no jugular venous distention. No thyroid enlargement, no tenderness.  LUNGS: Poor Resp. effort, no wheezing, rales, rhonchi. No use of accessory muscles of respiration.  CARDIOVASCULAR: S1, S2 normal. No murmurs, rubs, or gallops.  ABDOMEN: Soft, nontender, nondistended. Bowel sounds present. No organomegaly or mass.  EXTREMITIES: No cyanosis, clubbing, + 2 edema b/l. Signs of chronic venous stasis b/l.     NEUROLOGIC: Cranial nerves II through XII are intact. No focal Motor or sensory deficits b/l.  Globally weak.   PSYCHIATRIC: The patient is alert and oriented x 1-2  SKIN: No obvious rash, lesion, or ulcer.   Left groin dialysis catheter in place   LABORATORY PANEL:   CBC Recent Labs  Lab 06/18/19 0420  WBC 14.4*  HGB 9.5*  HCT 31.9*  PLT 200    ------------------------------------------------------------------------------------------------------------------  Chemistries  Recent Labs  Lab 06/15/19 1348  06/18/19 1034  NA 131*   < > 136  K 7.2*   < > 4.2  CL 97*   < > 101  CO2 18*   < > 25  GLUCOSE 262*   < > 111*  BUN 109*   < > 28*  CREATININE 16.23*   < > 4.55*  CALCIUM 7.1*   < > 8.3*  MG  --    < > 1.9  AST 12*  --   --   ALT 11  --   --   ALKPHOS 140*  --   --   BILITOT 0.6  --   --    < > = values in this interval not displayed.   ------------------------------------------------------------------------------------------------------------------  Cardiac Enzymes No results for input(s): TROPONINI in the last 168 hours. ------------------------------------------------------------------------------------------------------------------  RADIOLOGY:  Dg Chest Port 1 View  Result Date: 06/18/2019 CLINICAL DATA:  Acute respiratory failure EXAM: PORTABLE CHEST 1 VIEW COMPARISON:  06/15/2019 FINDINGS: Cardiac shadow is enlarged but stable. No pneumothorax is noted. Mild central vascular congestion is seen increased from the prior exam. No focal infiltrate is noted. No bony abnormality is seen. IMPRESSION: Mild vascular congestion. Electronically Signed   By: Inez Catalina M.D.   On: 06/18/2019 10:58     ASSESSMENT AND PLAN:   53 year old male with past medical history of hypertension, diabetes, CKD stage III, history of chronic diastolic CHF, obstructive sleep apnea, psoriasis, pulmonary hypertension who presented to the hospital due to acute on chronic renal failure with severe  hyperkalemia.  1.  Acute on chronic renal failure- secondary to dehydration, diabetes and underlying CKD. - Patient's baseline creatinines around 1.4 in May of this past year and patient presented to the hospital the creatinine greater than 10. - Remains oliguric/anuric and therefore started on CRRT. - Renal ultrasound was negative for  hydronephrosis.  Serologic work-up has been initiated by nephrology. - Continue CRRT for now per Dr. Holley Raring.  Acute on chronic respiratory failure with hypoxia due to fluid overload and acute on chronic diastolic CHF.  Continue CRRT.  2.  Hyperkalemia-secondary to the acute on chronic renal failure. -Patient received some Lokelma and was started on dialysis.  Improved.  3.  Essential hypertension-continue carvedilol, Norvasc, Imdur.  4.  Diabetes type 2 with underlying CKD stage III-continue Lantus, sliding scale insulin. -Continue carb controlled diet.  Follow blood sugars.  5.  COPD-no acute exacerbation -Continue Anoro Ellipta, albuterol inhaler as needed.  6.  Hyperlipidemia-continue atorvastatin.  OSA and morbid obesity.  CPAP/BiPAP per RT. All the records are reviewed and case discussed with Care Management/Social Worker. Management plans discussed with the patient, family and they are in agreement.  CODE STATUS: Full code  DVT Prophylaxis: Hep. SQ  TOTAL TIME TAKING CARE OF THIS PATIENT: 28 minutes.   POSSIBLE D/C IN ? DAYS, DEPENDING ON CLINICAL CONDITION.   Demetrios Loll M.D on 06/18/2019 at 2:31 PM  Between 7am to 6pm - Pager - [phone]  After 6pm go to [email or web address] - password [email or web address] [facility]  Office  [phone]  CC: Primary care physician; Lavera Guise, MD

## 2019-06-18 NOTE — Progress Notes (Signed)
CRRT Cartridge clotted off. Blood returned to Pt. HD cath ports packed with heparin. Started back on CRRT as per order. Tolerating well.

## 2019-06-18 NOTE — Progress Notes (Signed)
CRITICAL CARE NOTE      CHIEF COMPLAINT:   Acute Kidney Injury and severe hyperkalemia   Subjective    53 yo with DM, HTN, CKD admits too medication noncompliance came in with florid pulm edema and aki creat 16, severe hyperkalemia >7.  Required emergent HD     Hypoxemia resolved CKD imporved post HD Mentation has improved significantly.  Will optimize for downgrade to medical floor.    PAST MEDICAL HISTORY   Past Medical History:  Diagnosis Date  . Bell's palsy   . CHF (congestive heart failure) ([place])   . Chronic kidney disease   . Diabetes mellitus without complication ([place])   . Hypertension   . NSVT (nonsustained ventricular tachycardia) ([place])   . Obstructive sleep apnea   . Psoriasis   . Pulmonary HTN ([place])      SURGICAL HISTORY   Past Surgical History:  Procedure Laterality Date  . CORONARY ANGIOPLASTY WITH STENT PLACEMENT    . NO PAST SURGERIES       FAMILY HISTORY   Family History  Problem Relation Age of Onset  . Heart failure Mother   . Kidney failure Brother      SOCIAL HISTORY   Social History   Tobacco Use  . Smoking status: Never Smoker  . Smokeless tobacco: Never Used  Substance Use Topics  . Alcohol use: Yes    Alcohol/week: 2.0 standard drinks    Types: 1 Shots of liquor, 1 Cans of beer per week    Comment: very rarely 1-2 times per month  . Drug use: No     MEDICATIONS   Current Medication:  Current Facility-Administered Medications:  .  0.9 %  sodium chloride infusion, 250 mL, Intravenous, PRN, Demetrios Loll, MD .  acetaminophen (TYLENOL) tablet 650 mg, 650 mg, Oral, Q6H PRN **OR** acetaminophen (TYLENOL) suppository 650 mg, 650 mg, Rectal, Q6H PRN, Demetrios Loll, MD .  albuterol (VENTOLIN HFA) 108 (90 Base) MCG/ACT inhaler 2 puff, 2 puff, Inhalation,  Q4H PRN, Demetrios Loll, MD .  amLODipine (NORVASC) tablet 5 mg, 5 mg, Oral, Daily, Demetrios Loll, MD, 5 mg at 06/15/19 1856 .  atorvastatin (LIPITOR) tablet 40 mg, 40 mg, Oral, QHS, Demetrios Loll, MD, 40 mg at 06/17/19 2114 .  bisacodyl (DULCOLAX) EC tablet 5 mg, 5 mg, Oral, Daily PRN, Demetrios Loll, MD .  carvedilol (COREG) tablet 25 mg, 25 mg, Oral, Q12H, Demetrios Loll, MD, Stopped at 06/15/19 2220 .  cholecalciferol (VITAMIN D3) tablet 2,000 Units, 2,000 Units, Oral, Daily, Charlett Nose, RPH, 2,000 Units at 06/17/19 1053 .  heparin injection 1,000-6,000 Units, 1,000-6,000 Units, CRRT, PRN, Awilda Bill, NP .  heparin injection 5,000 Units, 5,000 Units, Subcutaneous, Q8H, Demetrios Loll, MD, 5,000 Units at 06/18/19 1937 .  hydrALAZINE (APRESOLINE) tablet 25 mg, 25 mg, Oral, Q8H, Demetrios Loll, MD, Stopped at 06/15/19 2220 .  hydrocerin (EUCERIN) cream, , Topical, TID, Ottie Glazier, MD .  HYDROcodone-acetaminophen (NORCO/VICODIN) 5-325 MG per tablet 1-2 tablet, 1-2 tablet, Oral, Q4H PRN, Demetrios Loll, MD, 1 tablet at 06/17/19 1449 .  insulin aspart (novoLOG) injection 0-15 Units, 0-15 Units, Subcutaneous, TID WC, Ottie Glazier, MD, 2 Units at 06/17/19 1320 .  insulin aspart (novoLOG) injection 0-5 Units, 0-5 Units, Subcutaneous, QHS, , , MD .  insulin glargine (LANTUS) injection 20 Units, 20 Units, Subcutaneous, Daily, Ottie Glazier, MD, 20 Units at 06/17/19 1052 .  isosorbide mononitrate (IMDUR) 24 hr tablet 60 mg, 60 mg, Oral, Daily, Demetrios Loll, MD, 60  mg at 06/15/19 1857 .  MEDLINE mouth rinse, 15 mL, Mouth Rinse, BID, , , MD, 15 mL at 06/17/19 2119 .  ondansetron (ZOFRAN) tablet 4 mg, 4 mg, Oral, Q6H PRN **OR** ondansetron (ZOFRAN) injection 4 mg, 4 mg, Intravenous, Q6H PRN, Demetrios Loll, MD, 4 mg at 06/17/19 1547 .  pureflow IV solution for Dialysis, , CRRT, Continuous, [name] D, NP, Last Rate: 2,000 mL/hr at 06/18/19 0400 .  senna-docusate (Senokot-S) tablet 1  tablet, 1 tablet, Oral, QHS PRN, Demetrios Loll, MD .  sodium chloride flush (NS) 0.9 % injection 3 mL, 3 mL, Intravenous, Q12H, Demetrios Loll, MD, 3 mL at 06/17/19 1053 .  sodium chloride flush (NS) 0.9 % injection 3 mL, 3 mL, Intravenous, PRN, Demetrios Loll, MD .  traZODone (DESYREL) tablet 25-50 mg, 25-50 mg, Oral, QHS PRN, Demetrios Loll, MD .  umeclidinium-vilanterol [facility] ELLIPTA) 62.5-25 MCG/INH 1 puff, 1 puff, Inhalation, Daily, Demetrios Loll, MD, 1 puff at 06/17/19 1054    ALLERGIES   Shellfish allergy    REVIEW OF SYSTEMS    10 point ROS conducted is negative except for hunger  PHYSICAL EXAMINATION   Vitals:   06/18/19 0625 06/18/19 0700  BP: (!) 162/93 (!) 166/95  Pulse: 100 (!) 101  Resp: 11 16  Temp:    SpO2: 97% 97%    GENERAL: Mild distress due to shortness of breath and lower extremity pain HEAD: Normocephalic, atraumatic.  EYES: Pupils equal, round, reactive to light.  No scleral icterus.  MOUTH: Moist mucosal membrane. NECK: Supple. No thyromegaly. No nodules. No JVD.  PULMONARY: Decreased breath sounds bilaterally CARDIOVASCULAR: S1 and S2. Regular rate and rhythm. No murmurs, rubs, or gallops.  GASTROINTESTINAL: Soft, nontender, non-distended. No masses. Positive bowel sounds. No hepatosplenomegaly.  MUSCULOSKELETAL: No swelling, clubbing, or 3+ edema NEUROLOGIC: Mild distress due to acute illness SKIN:intact,warm,dry   LABS AND IMAGING     LAB RESULTS: Recent Labs  Lab 06/17/19 2233 06/18/19 0232 06/18/19 0616  NA 136 136 136  K 4.4 4.4 4.2  CL 103 102 101  CO2 25 25 25   BUN 37* 33* 30*  CREATININE 5.87* 5.17* 4.60*  GLUCOSE 114* 111* 110*   Recent Labs  Lab 06/16/19 0223 06/17/19 0526 06/18/19 0420  HGB 9.1* 9.6* 9.5*  HCT 30.0* 32.0* 31.9*  WBC 12.8* 11.8* 14.4*  PLT 233 201 200     IMAGING RESULTS: No results found.    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 with noncompliance -Nephrology on case-appreciate input following recommendations -Emergent HD -follow chem 7 -follow UO -continue Foley Catheter-assess need daily    Obstructive sleep apnea -May provide CPAP with home settings as per RT   GI/Nutrition GI PROPHYLAXIS as indicated DIET-->TF's as tolerated Constipation protocol as indicated  ENDO - ICU hypoglycemic\Hyperglycemia protocol -check FSBS per protocol   ELECTROLYTES -follow labs as needed -replace as needed -pharmacy consultation   DVT/GI PRX ordered -SCDs  TRANSFUSIONS AS NEEDED MONITOR FSBS ASSESS the need for LABS as needed   Critical care provider statement:    Critical care time (  minutes):  31   Critical care time was exclusive of:  Separately billable procedures and treating other patients   Critical care was necessary to treat or prevent imminent or life-threatening deterioration of the following conditions:   Severe hyperkalemia, acute on chronic renal insufficiency stage IV, morbid obesity, pulmonary hypertension, obstructive sleep apnea   Critical care was time spent personally by me on the following activities:  Development of treatment plan with patient or surrogate, discussions with consultants, evaluation of patient's response to treatment, examination of patient, obtaining history from patient or surrogate, ordering and performing treatments and interventions, ordering and review of laboratory studies and re-evaluation of patient's condition.  I assumed direction of critical care for this patient from another provider  in my specialty: no    This document was prepared using Dragon voice recognition software and may include unintentional dictation errors.    Ottie Glazier, M.D.  Division of [place]

## 2019-06-19 LAB — RENAL FUNCTION PANEL
Albumin: 2 g/dL — ABNORMAL LOW (ref 3.5–5.0)
Albumin: 1.9 g/dL — ABNORMAL LOW (ref 3.5–5.0)
Albumin: 1.6 g/dL — ABNORMAL LOW (ref 3.5–5.0)
Albumin: 1.6 g/dL — ABNORMAL LOW (ref 3.5–5.0)
Albumin: 1.8 g/dL — ABNORMAL LOW (ref 3.5–5.0)
Albumin: 1.9 g/dL — ABNORMAL LOW (ref 3.5–5.0)
Anion gap: 8 (ref 5–15)
Anion gap: 5 (ref 5–15)
Anion gap: 7 (ref 5–15)
Anion gap: 4 — ABNORMAL LOW (ref 5–15)
Anion gap: 6 (ref 5–15)
Anion gap: 6 (ref 5–15)
BUN: 19 mg/dL (ref 6–20)
BUN: 18 mg/dL (ref 6–20)
BUN: 16 mg/dL (ref 6–20)
BUN: 16 mg/dL (ref 6–20)
BUN: 19 mg/dL (ref 6–20)
BUN: 17 mg/dL (ref 6–20)
CO2: 26 mmol/L (ref 22–32)
CO2: 29 mmol/L (ref 22–32)
CO2: 21 mmol/L — ABNORMAL LOW (ref 22–32)
CO2: 23 mmol/L (ref 22–32)
CO2: 27 mmol/L (ref 22–32)
CO2: 27 mmol/L (ref 22–32)
Calcium: 8.5 mg/dL — ABNORMAL LOW (ref 8.9–10.3)
Calcium: 8.6 mg/dL — ABNORMAL LOW (ref 8.9–10.3)
Calcium: 6.9 mg/dL — ABNORMAL LOW (ref 8.9–10.3)
Calcium: 6.5 mg/dL — ABNORMAL LOW (ref 8.9–10.3)
Calcium: 8.2 mg/dL — ABNORMAL LOW (ref 8.9–10.3)
Calcium: 8.1 mg/dL — ABNORMAL LOW (ref 8.9–10.3)
Chloride: 101 mmol/L (ref 98–111)
Chloride: 101 mmol/L (ref 98–111)
Chloride: 110 mmol/L (ref 98–111)
Chloride: 108 mmol/L (ref 98–111)
Chloride: 100 mmol/L (ref 98–111)
Chloride: 100 mmol/L (ref 98–111)
Creatinine, Ser: 3.28 mg/dL — ABNORMAL HIGH (ref 0.61–1.24)
Creatinine, Ser: 3.18 mg/dL — ABNORMAL HIGH (ref 0.61–1.24)
Creatinine, Ser: 2.32 mg/dL — ABNORMAL HIGH (ref 0.61–1.24)
Creatinine, Ser: 2.42 mg/dL — ABNORMAL HIGH (ref 0.61–1.24)
Creatinine, Ser: 2.91 mg/dL — ABNORMAL HIGH (ref 0.61–1.24)
Creatinine, Ser: 2.7 mg/dL — ABNORMAL HIGH (ref 0.61–1.24)
GFR calc Af Amer: 24 mL/min — ABNORMAL LOW (ref >60)
GFR calc Af Amer: 25 mL/min — ABNORMAL LOW (ref >60)
GFR calc Af Amer: 37 mL/min — ABNORMAL LOW (ref >60)
GFR calc Af Amer: 35 mL/min — ABNORMAL LOW (ref >60)
GFR calc Af Amer: 28 mL/min — ABNORMAL LOW (ref >60)
GFR calc Af Amer: 30 mL/min — ABNORMAL LOW (ref >60)
GFR calc non Af Amer: 21 mL/min — ABNORMAL LOW (ref >60)
GFR calc non Af Amer: 22 mL/min — ABNORMAL LOW (ref >60)
GFR calc non Af Amer: 32 mL/min — ABNORMAL LOW (ref >60)
GFR calc non Af Amer: 30 mL/min — ABNORMAL LOW (ref >60)
GFR calc non Af Amer: 24 mL/min — ABNORMAL LOW (ref >60)
GFR calc non Af Amer: 26 mL/min — ABNORMAL LOW (ref >60)
Glucose, Bld: 97 mg/dL (ref 70–99)
Glucose, Bld: 84 mg/dL (ref 70–99)
Glucose, Bld: 98 mg/dL (ref 70–99)
Glucose, Bld: 170 mg/dL — ABNORMAL HIGH (ref 70–99)
Glucose, Bld: 194 mg/dL — ABNORMAL HIGH (ref 70–99)
Glucose, Bld: 158 mg/dL — ABNORMAL HIGH (ref 70–99)
Phosphorus: 3.1 mg/dL (ref 2.5–4.6)
Phosphorus: 2.7 mg/dL (ref 2.5–4.6)
Phosphorus: 1.7 mg/dL — ABNORMAL LOW (ref 2.5–4.6)
Phosphorus: 2.1 mg/dL — ABNORMAL LOW (ref 2.5–4.6)
Phosphorus: 2.7 mg/dL (ref 2.5–4.6)
Phosphorus: 2.5 mg/dL (ref 2.5–4.6)
Potassium: 3.5 mmol/L (ref 3.5–5.1)
Potassium: 3.5 mmol/L (ref 3.5–5.1)
Potassium: 2.6 mmol/L — CL (ref 3.5–5.1)
Potassium: 2.9 mmol/L — ABNORMAL LOW (ref 3.5–5.1)
Potassium: 3.8 mmol/L (ref 3.5–5.1)
Potassium: 3.7 mmol/L (ref 3.5–5.1)
Sodium: 135 mmol/L (ref 135–145)
Sodium: 135 mmol/L (ref 135–145)
Sodium: 138 mmol/L (ref 135–145)
Sodium: 135 mmol/L (ref 135–145)
Sodium: 133 mmol/L — ABNORMAL LOW (ref 135–145)
Sodium: 133 mmol/L — ABNORMAL LOW (ref 135–145)

## 2019-06-19 LAB — MAGNESIUM
Magnesium: 1.9 mg/dL (ref 1.7–2.4)
Magnesium: 2 mg/dL (ref 1.7–2.4)
Magnesium: 1.4 mg/dL — ABNORMAL LOW (ref 1.7–2.4)
Magnesium: 1.8 mg/dL (ref 1.7–2.4)
Magnesium: 2.2 mg/dL (ref 1.7–2.4)
Magnesium: 2.2 mg/dL (ref 1.7–2.4)

## 2019-06-19 LAB — GLUCOSE, CAPILLARY
Glucose-Capillary: 67 mg/dL — ABNORMAL LOW (ref 70–99)
Glucose-Capillary: 68 mg/dL — ABNORMAL LOW (ref 70–99)
Glucose-Capillary: 56 mg/dL — ABNORMAL LOW (ref 70–99)
Glucose-Capillary: 150 mg/dL — ABNORMAL HIGH (ref 70–99)
Glucose-Capillary: 108 mg/dL — ABNORMAL HIGH (ref 70–99)
Glucose-Capillary: 146 mg/dL — ABNORMAL HIGH (ref 70–99)
Glucose-Capillary: 154 mg/dL — ABNORMAL HIGH (ref 70–99)

## 2019-06-19 LAB — CBC WITH DIFFERENTIAL/PLATELET
Abs Immature Granulocytes: 0.1 10*3/uL — ABNORMAL HIGH (ref 0.00–0.07)
Basophils Absolute: 0 10*3/uL (ref 0.0–0.1)
Basophils Relative: 0 %
Eosinophils Absolute: 0.4 10*3/uL (ref 0.0–0.5)
Eosinophils Relative: 4 %
HCT: 28.8 % — ABNORMAL LOW (ref 39.0–52.0)
Hemoglobin: 8.8 g/dL — ABNORMAL LOW (ref 13.0–17.0)
Immature Granulocytes: 1 %
Lymphocytes Relative: 14 %
Lymphs Abs: 1.3 10*3/uL (ref 0.7–4.0)
MCH: 24 pg — ABNORMAL LOW (ref 26.0–34.0)
MCHC: 30.6 g/dL (ref 30.0–36.0)
MCV: 78.5 fL — ABNORMAL LOW (ref 80.0–100.0)
Monocytes Absolute: 0.9 10*3/uL (ref 0.1–1.0)
Monocytes Relative: 10 %
Neutro Abs: 6.6 10*3/uL (ref 1.7–7.7)
Neutrophils Relative %: 71 %
Platelets: 156 10*3/uL (ref 150–400)
RBC: 3.67 MIL/uL — ABNORMAL LOW (ref 4.22–5.81)
RDW: 18.9 % — ABNORMAL HIGH (ref 11.5–15.5)
WBC: 9.3 10*3/uL (ref 4.0–10.5)
nRBC: 0.2 % (ref 0.0–0.2)

## 2019-06-19 LAB — APTT: aPTT: 57 seconds — ABNORMAL HIGH (ref 24–36)

## 2019-06-19 MED ORDER — MAGNESIUM SULFATE 4 GM/100ML IV SOLN
4.0000 g | Freq: Once | INTRAVENOUS | Status: AC
  Administered 2019-06-19: 4 g via INTRAVENOUS
  Filled 2019-06-19: qty 100

## 2019-06-19 MED ORDER — POTASSIUM PHOSPHATES 15 MMOLE/5ML IV SOLN
30.0000 mmol | Freq: Once | INTRAVENOUS | Status: AC
  Administered 2019-06-19: 30 mmol via INTRAVENOUS
  Filled 2019-06-19: qty 10

## 2019-06-19 MED ORDER — DEXTROSE 50 % IV SOLN
INTRAVENOUS | Status: AC
  Filled 2019-06-19: qty 50

## 2019-06-19 MED ORDER — PUREFLOW DIALYSIS SOLUTION
INTRAVENOUS | Status: DC
  Administered 2019-06-19 – 2019-06-20 (×6): via INTRAVENOUS_CENTRAL
  Administered 2019-06-21: 3 via INTRAVENOUS_CENTRAL
  Administered 2019-06-21: 01:00:00 via INTRAVENOUS_CENTRAL

## 2019-06-19 MED ORDER — OXYCODONE-ACETAMINOPHEN 5-325 MG PO TABS
1.0000 | ORAL_TABLET | Freq: Four times a day (QID) | ORAL | Status: DC | PRN
  Administered 2019-06-19 – 2019-06-20 (×3): 1 via ORAL
  Administered 2019-06-21 – 2019-06-22 (×3): 2 via ORAL
  Administered 2019-06-22: 1 via ORAL
  Administered 2019-06-22 – 2019-06-25 (×7): 2 via ORAL
  Filled 2019-06-19 (×2): qty 2
  Filled 2019-06-19: qty 1
  Filled 2019-06-19 (×3): qty 2
  Filled 2019-06-19: qty 1
  Filled 2019-06-19: qty 2
  Filled 2019-06-19: qty 1
  Filled 2019-06-19 (×5): qty 2

## 2019-06-19 MED ORDER — OXYCODONE-ACETAMINOPHEN 5-325 MG PO TABS
1.0000 | ORAL_TABLET | Freq: Once | ORAL | Status: AC
  Administered 2019-06-19: 1 via ORAL
  Filled 2019-06-19: qty 1

## 2019-06-19 MED ORDER — DEXTROSE 50 % IV SOLN
25.0000 mL | Freq: Once | INTRAVENOUS | Status: AC
  Administered 2019-06-19: 25 mL via INTRAVENOUS

## 2019-06-19 NOTE — Progress Notes (Signed)
[facility] NAME: [name]    MR#:  [id number]  DATE OF BIRTH:  [birth year]  SUBJECTIVE:   The patient has no complaints, of BiPAP.  Still oliguria, on CRRT. REVIEW OF SYSTEMS:    Review of Systems  Constitutional: Positive for malaise/fatigue. Negative for chills and fever.  HENT: Negative for sore throat.   Eyes: Negative for blurred vision and double vision.  Respiratory: Negative for cough, hemoptysis, shortness of breath, wheezing and stridor.   Cardiovascular: Negative for chest pain, palpitations, orthopnea and leg swelling.  Gastrointestinal: Negative for abdominal pain, blood in stool, diarrhea, melena, nausea and vomiting.  Genitourinary: Negative for dysuria, flank pain and hematuria.  Musculoskeletal: Negative for back pain and joint pain.  Neurological: Negative for dizziness, sensory change, focal weakness, seizures, loss of consciousness, weakness and headaches.  Endo/Heme/Allergies: Negative for polydipsia.  Psychiatric/Behavioral: Negative for depression. The patient is not nervous/anxious.     Nutrition: Renal/Carb modified Tolerating Diet: yes Tolerating PT: Await Eval.   DRUG ALLERGIES:   Allergies  Allergen Reactions  . Shellfish Allergy Anaphylaxis    Face and throat swelling, difficulty breathing Allergy can be triggered by touching (contact)    VITALS:  Blood pressure (!) 154/117, pulse 91, temperature 98.4 F (36.9 C), temperature source Oral, resp. rate 20, height 5\' 3"  (1.6 m), weight 128.1 kg, SpO2 94 %.  PHYSICAL EXAMINATION:   Physical Exam  GENERAL:  53 y.o.-year-old obese patient lying in bed in no acute distress.  Morbid obesity. EYES: Pupils equal, round, reactive to light and accommodation. No scleral icterus. Extraocular muscles intact.  HEENT: Head atraumatic, normocephalic. Oropharynx and nasopharynx clear.  NECK:  Supple, no jugular venous distention. No thyroid  enlargement, no tenderness.  LUNGS: Poor Resp. effort, no wheezing, rales, rhonchi. No use of accessory muscles of respiration.  CARDIOVASCULAR: S1, S2 normal. No murmurs, rubs, or gallops.  ABDOMEN: Soft, nontender, nondistended. Bowel sounds present. No organomegaly or mass.  EXTREMITIES: No cyanosis, clubbing, + 1 edema b/l. Signs of chronic venous stasis b/l.     NEUROLOGIC: Cranial nerves II through XII are intact. No focal Motor or sensory deficits b/l.  Globally weak.   PSYCHIATRIC: The patient is alert and oriented x 3.  SKIN: No obvious rash, lesion, or ulcer.   Left groin dialysis catheter in place   LABORATORY PANEL:   CBC Recent Labs  Lab 06/19/19 0558  WBC 9.3  HGB 8.8*  HCT 28.8*  PLT 156   ------------------------------------------------------------------------------------------------------------------  Chemistries  Recent Labs  Lab 06/15/19 1348  06/19/19 1029 06/19/19 1146  NA 131*   < >  --  138  K 7.2*   < >  --  2.6*  CL 97*   < >  --  110  CO2 18*   < >  --  21*  GLUCOSE 262*   < >  --  98  BUN 109*   < >  --  16  CREATININE 16.23*   < >  --  2.32*  CALCIUM 7.1*   < >  --  6.9*  MG  --    < > 1.4*  --   AST 12*  --   --   --   ALT 11  --   --   --   ALKPHOS 140*  --   --   --   BILITOT 0.6  --   --   --    < > =  values in this interval not displayed.   ------------------------------------------------------------------------------------------------------------------  Cardiac Enzymes No results for input(s): TROPONINI in the last 168 hours. ------------------------------------------------------------------------------------------------------------------  RADIOLOGY:  Dg Abd 1 View  Result Date: 06/18/2019 CLINICAL DATA:  Acute generalized abdominal pain. EXAM: ABDOMEN - 1 VIEW COMPARISON:  None. FINDINGS: The bowel gas pattern is normal. No radio-opaque calculi or other significant radiographic abnormality are seen. IMPRESSION: No evidence of  bowel obstruction or ileus. Electronically Signed   By: Marijo Conception M.D.   On: 06/18/2019 17:04   Dg Chest Port 1 View  Result Date: 06/18/2019 CLINICAL DATA:  Acute respiratory failure EXAM: PORTABLE CHEST 1 VIEW COMPARISON:  06/15/2019 FINDINGS: Cardiac shadow is enlarged but stable. No pneumothorax is noted. Mild central vascular congestion is seen increased from the prior exam. No focal infiltrate is noted. No bony abnormality is seen. IMPRESSION: Mild vascular congestion. Electronically Signed   By: Inez Catalina M.D.   On: 06/18/2019 10:58     ASSESSMENT AND PLAN:   53 year old male with past medical history of hypertension, diabetes, CKD stage III, history of chronic diastolic CHF, obstructive sleep apnea, psoriasis, pulmonary hypertension who presented to the hospital due to acute on chronic renal failure with severe hyperkalemia.  1.  Acute on chronic renal failure- secondary to dehydration, diabetes and underlying CKD. - Patient's baseline creatinines around 1.4 in May of this past year and patient presented to the hospital the creatinine greater than 10. - Remains oliguric/anuric and therefore started on CRRT. - Renal ultrasound was negative for hydronephrosis.  Serologic work-up has been initiated by nephrology. - Continue CRRT for now per Dr. Holley Raring.  Acute on chronic respiratory failure with hypoxia due to fluid overload and acute on chronic diastolic CHF.  Continue CRRT.  2.  Hyperkalemia-secondary to the acute on chronic renal failure. -Patient received some Lokelma and was started on dialysis.  Improved.  Hypokalemia and hypomagnesemia.  Supplement and adjust during CRRT.  3.  Essential hypertension-continue carvedilol, Norvasc, Imdur.  4.  Diabetes type 2 with underlying CKD stage III-continue Lantus, sliding scale insulin. -Continue carb controlled diet.    5.  COPD-no acute exacerbation -Continue Anoro Ellipta, albuterol inhaler as needed.  6.   Hyperlipidemia-continue atorvastatin.  OSA and morbid obesity.  CPAP/BiPAP HS. Discussed with Dr. Lanney Gins. All the records are reviewed and case discussed with Care Management/Social Worker. Management plans discussed with the patient, family and they are in agreement.  CODE STATUS: Full code  DVT Prophylaxis: Hep. SQ  TOTAL TIME TAKING CARE OF THIS PATIENT: 28 minutes.   POSSIBLE D/C IN ? DAYS, DEPENDING ON CLINICAL CONDITION.   Demetrios Loll M.D on 06/19/2019 at 3:36 PM  Between 7am to 6pm - Pager - [phone]  After 6pm go to [email or web address] - password [email or web address] [facility] [facility]  Office  [phone]  CC: Primary care physician; Lavera Guise, MD

## 2019-06-19 NOTE — Progress Notes (Signed)
[facility]  ROUNDING NOTE   Subjective:  Patient seen at bedside. Tolerating CRRT well. Much more awake and alert today.   Objective:  Vital signs in last 24 hours:  Temp:  [97.8 F (36.6 C)-98.4 F (36.9 C)] 98.1 F (36.7 C) (07/03 0749) Pulse Rate:  [75-94] 92 (07/03 1100) Resp:  [11-28] 19 (07/03 1100) BP: (102-146)/(53-92) 111/70 (07/03 1100) SpO2:  [92 %-100 %] 92 % (07/03 1100) FiO2 (%):  [40 %] 40 % (07/03 0700) Weight:  [128.1 kg] 128.1 kg (07/03 0437)  Weight change: 0.8 kg Filed Weights   06/17/19 0500 06/18/19 0424 06/19/19 0437  Weight: 129.4 kg 127.3 kg 128.1 kg    Intake/Output: I/O last 3 completed shifts: In: 3 [I.V.:3] Out: 2232 [Urine:200; Other:2032]   Intake/Output this shift:  Total I/O In: 240 [P.O.:240] Out: 289 [Other:289]  Physical Exam: General: No acute distress  Head: Normocephalic, atraumatic. Moist oral mucosal membranes  Eyes: Anicteric  Neck: Supple, trachea midline  Lungs:  Scattered rhonchi, on BiPAP  Heart: S1S2 no rubs  Abdomen:  Soft, nontender, bowel sounds present  Extremities: 2+ peripheral edema.  Neurologic: Awake, alert, conversant  Skin: No lesions  Access: Left femoral vein dialysis catheter    Basic Metabolic Panel: Recent Labs  Lab 06/18/19 1411 06/18/19 1812 06/18/19 2204 06/19/19 0148 06/19/19 0558 06/19/19 1029 06/19/19 1146  NA 134* 136 135 135 135  --  138  K 3.9 3.7 3.7 3.5 3.5  --  2.6*  CL 100 101 101 101 101  --  110  CO2 _0 --  21*  GLUCOSE 103* 94 88 97 84  --  98  BUN 25* 23* 23* 19 18  --  16  CREATININE 4.29* 3.73* 3.87* 3.28* 3.18*  --  2.32*  CALCIUM 8.3* 8.6* 8.5* 8.5* 8.6*  --  6.9*  MG 2.2 2.0  --  1.9 2.0 1.4*  --   PHOS 3.9 3.4 3.5 3.1 2.7  --  1.7*    Liver Function Tests: Recent Labs  Lab 06/15/19 1348  06/18/19 1812 06/18/19 2204 06/19/19 0148 06/19/19 0558 06/19/19 1146  AST 12*  --   --   --   --   --   --   ALT 11  --   --   --   --    --   --   ALKPHOS 140*  --   --   --   --   --   --   BILITOT 0.6  --   --   --   --   --   --   PROT 6.7  --   --   --   --   --   --   ALBUMIN 2.4*   < > 2.1* 2.0* 2.0* 1.9* 1.6*   < > = values in this interval not displayed.   No results for input(s): LIPASE, AMYLASE in the last 168 hours. No results for input(s): AMMONIA in the last 168 hours.  CBC: Recent Labs  Lab 06/15/19 1348 06/16/19 0223 06/17/19 0526 06/18/19 0420 06/19/19 0558  WBC 12.8* 12.8* 11.8* 14.4* 9.3  NEUTROABS 9.8*  --   --   --  6.6  HGB 9.8* 9.1* 9.6* 9.5* 8.8*  HCT 32.7* 30.0* 32.0* 31.9* 28.8*  MCV 78.4* 77.3* 78.8* 79.0* 78.5*  PLT 236 233 201 200 156    Cardiac Enzymes: No results for input(s): CKTOTAL, CKMB, CKMBINDEX, TROPONINI in the  last 168 hours.  BNP: Invalid input(s): POCBNP  CBG: Recent Labs  Lab 06/19/19 0802 06/19/19 0804 06/19/19 0910 06/19/19 1036 06/19/19 1221  GLUCAP 67* 68* 56* 150* 108*    Microbiology: Results for orders placed or performed during the hospital encounter of 06/15/19  Blood culture (routine x 2)     Status: None (Preliminary result)   Collection Time: 06/15/19  1:48 PM   Specimen: BLOOD  Result Value Ref Range Status   Specimen Description BLOOD BLOOD LEFT ARM  Final   Special Requests   Final    BOTTLES DRAWN AEROBIC AND ANAEROBIC Blood Culture adequate volume   Culture   Final    NO GROWTH 4 DAYS Performed at [facility], [address], [place], [place] [address]    Report Status PENDING  Incomplete  SARS Coronavirus 2 (CEPHEID- Performed in [place] hospital lab), Hosp Order     Status: None   Collection Time: 06/15/19  1:57 PM   Specimen: Nasopharyngeal Swab  Result Value Ref Range Status   SARS Coronavirus 2 NEGATIVE NEGATIVE Final    Comment: (NOTE) If result is NEGATIVE SARS-CoV-2 target nucleic acids are NOT DETECTED. The SARS-CoV-2 RNA is generally detectable in upper and lower  respiratory specimens during the acute  phase of infection. The lowest  concentration of SARS-CoV-2 viral copies this assay can detect is 250  copies / mL. A negative result does not preclude SARS-CoV-2 infection  and should not be used as the sole basis for treatment or other  patient management decisions.  A negative result may occur with  improper specimen collection / handling, submission of specimen other  than nasopharyngeal swab, presence of viral mutation(s) within the  areas targeted by this assay, and inadequate number of viral copies  (<250 copies / mL). A negative result must be combined with clinical  observations, patient history, and epidemiological information. If result is POSITIVE SARS-CoV-2 target nucleic acids are DETECTED. The SARS-CoV-2 RNA is generally detectable in upper and lower  respiratory specimens dur ing the acute phase of infection.  Positive  results are indicative of active infection with SARS-CoV-2.  Clinical  correlation with patient history and other diagnostic information is  necessary to determine patient infection status.  Positive results do  not rule out bacterial infection or co-infection with other viruses. If result is PRESUMPTIVE POSTIVE SARS-CoV-2 nucleic acids MAY BE PRESENT.   A presumptive positive result was obtained on the submitted specimen  and confirmed on repeat testing.  While 2019 novel coronavirus  (SARS-CoV-2) nucleic acids may be present in the submitted sample  additional confirmatory testing may be necessary for epidemiological  and / or clinical management purposes  to differentiate between  SARS-CoV-2 and other Sarbecovirus currently known to infect humans.  If clinically indicated additional testing with an alternate test  methodology [phone]) is advised. The SARS-CoV-2 RNA is generally  detectable in upper and lower respiratory sp ecimens during the acute  phase of infection. The expected result is Negative. Fact Sheet for Patients:   StrictlyIdeas.no Fact Sheet for Healthcare Providers: BankingDealers.co.za This test is not yet approved or cleared by the [place] FDA and has been authorized for detection and/or diagnosis of SARS-CoV-2 by FDA under an Emergency Use Authorization (EUA).  This EUA will remain in effect (meaning this test can be used) for the duration of the COVID-19 declaration under Section 564(b)(1) of the Act, 21 U.S.C. section 360bbb-3(b)(1), unless the authorization is terminated or revoked sooner. Performed  at [facility], [address] [place], [place] [address]   Blood culture (routine x 2)     Status: None (Preliminary result)   Collection Time: 06/15/19  4:40 PM   Specimen: BLOOD  Result Value Ref Range Status   Specimen Description BLOOD BLOOD RIGHT ARM  Final   Special Requests   Final    BOTTLES DRAWN AEROBIC AND ANAEROBIC Blood Culture adequate volume   Culture   Final    NO GROWTH 4 DAYS Performed at [facility], [address] [place], [place] [address]    Report Status PENDING  Incomplete    Coagulation Studies: No results for input(s): LABPROT, INR in the last 72 hours.  Urinalysis: No results for input(s): COLORURINE, LABSPEC, PHURINE, GLUCOSEU, HGBUR, BILIRUBINUR, KETONESUR, PROTEINUR, UROBILINOGEN, NITRITE, LEUKOCYTESUR in the last 72 hours.  Invalid input(s): APPERANCEUR    Imaging: Dg Abd 1 View  Result Date: 06/18/2019 CLINICAL DATA:  Acute generalized abdominal pain. EXAM: ABDOMEN - 1 VIEW COMPARISON:  None. FINDINGS: The bowel gas pattern is normal. No radio-opaque calculi or other significant radiographic abnormality are seen. IMPRESSION: No evidence of bowel obstruction or ileus. Electronically Signed   By: Marijo Conception M.D.   On: 06/18/2019 17:04   Dg Chest Port 1 View  Result Date: 06/18/2019 CLINICAL DATA:  Acute respiratory failure EXAM: PORTABLE CHEST 1 VIEW COMPARISON:  06/15/2019  FINDINGS: Cardiac shadow is enlarged but stable. No pneumothorax is noted. Mild central vascular congestion is seen increased from the prior exam. No focal infiltrate is noted. No bony abnormality is seen. IMPRESSION: Mild vascular congestion. Electronically Signed   By: Inez Catalina M.D.   On: 06/18/2019 10:58     Medications:   . sodium chloride    . magnesium sulfate bolus IVPB    . potassium PHOSPHATE IVPB (in mmol)    . pureflow 2,000 mL/hr at 06/19/19 1200   . amLODipine  5 mg Oral Daily  . atorvastatin  40 mg Oral QHS  . carvedilol  25 mg Oral Q12H  . chlorhexidine  15 mL Mouth Rinse BID  . cholecalciferol  2,000 Units Oral Daily  . dextrose      . heparin  5,000 Units Subcutaneous Q8H  . hydrALAZINE  25 mg Oral Q8H  . hydrocerin   Topical TID  . insulin aspart  0-15 Units Subcutaneous TID WC  . insulin aspart  0-5 Units Subcutaneous QHS  . isosorbide mononitrate  60 mg Oral Daily  . mouth rinse  15 mL Mouth Rinse BID  . mouth rinse  15 mL Mouth Rinse q12n4p  . sodium chloride flush  3 mL Intravenous Q12H  . umeclidinium-vilanterol  1 puff Inhalation Daily   sodium chloride, acetaminophen **OR** acetaminophen, albuterol, bisacodyl, heparin, HYDROcodone-acetaminophen, ondansetron **OR** ondansetron (ZOFRAN) IV, senna-docusate, sodium chloride flush, traZODone  Assessment/ Plan:  53 y.o. male with a PMHx of Bell's palsy, congestive heart failure, diabetes mellitus type 2, hypertension, obstructive sleep apnea, psoriasis, pulmonary hypertension, chronic kidney disease stage II, who was admitted to [facility] on 06/15/2019 for evaluation of severe acute renal failure in the setting of generalized weakness.   1.  Acute renal failure/chronic kidney disease stage II Baseline EGFR 64 with suspected dehydration/diabetes mellitus type 2 with chronic kidney disease. -Urine output did increase to 200 cc over the preceding 24 hours.  However still has considerable acute renal failure as well as  volume overload.  Continue CRRT at this time with ultrafiltration target of 75  cc/h.  Monitor renal parameters frequently.  2.  Acute respiratory failure.  Patient now off of BiPAP.  Currently awake and alert.   LOS: 4 [name] 7/3/20201:46 PM

## 2019-06-19 NOTE — Progress Notes (Signed)
Patient alert and oriented- taken off bipap this am- on 3 liters nasal cannula all day.  Tolerating diet and vitals stable.  Patient  CRRT cartilage clotted off and changing it at this time.

## 2019-06-19 NOTE — Clinical Social Work Note (Signed)
Pt continues to exhibit signs of hypercapnia associated with chronic respiratory failure secondary to severe COPD. Patient requires the use of NIV both QHS and daytime to help with exacerbation periods. The se of the NIV will treat patient's high PCo2 levels and can reduce risk of exacerbations and future hospitalizations when used at night and during the day. Pt will need these advanced settings in conjunction with her current medication regimen; BiPAP is not an option due to its functional limitations and the severity of the patient's condition Failure to have NIV available for use over a 24 hour period could lead to death.

## 2019-06-19 NOTE — Progress Notes (Signed)
Inpatient Diabetes Program Recommendations  AACE/ADA: New Consensus Statement on Inpatient Glycemic Control (2015)  Target Ranges:  Prepandial:   less than 140 mg/dL      Peak postprandial:   less than 180 mg/dL (1-2 hours)      Critically ill patients:  140 - 180 mg/dL   Lab Results  Component Value Date   GLUCAP 108 (H) 06/19/2019   HGBA1C 14.7 (H) 06/18/2019    Review of Glycemic Control   Inpatient Diabetes Program Recommendations: patient had hypoglycemia on Novolog moderate correction scale. -Decrease Novolog correction scale to sensitive tid  Thank you, Nani Gasser. Hanks, RN, MSN, CDE  Diabetes Coordinator Inpatient Glycemic Control Team Team Pager [phone] (8am-5pm) 06/19/2019 12:49 PM

## 2019-06-19 NOTE — Progress Notes (Signed)
CRITICAL CARE NOTE      CHIEF COMPLAINT:   Acute Kidney Injury and severe hyperkalemia   Subjective    53 yo with DM, HTN, CKD admits too medication noncompliance came in with florid pulm edema and aki creat 16, severe hyperkalemia >7.  Required emergent HD     Hypoxemia resolved CKD imporved post HD Mentation has improved significantly.  Will optimize for downgrade to medical floor.    PAST MEDICAL HISTORY   Past Medical History:  Diagnosis Date  . Bell's palsy   . CHF (congestive heart failure) ([place])   . Chronic kidney disease   . Diabetes mellitus without complication ([place])   . Hypertension   . NSVT (nonsustained ventricular tachycardia) ([place])   . Obstructive sleep apnea   . Psoriasis   . Pulmonary HTN ([place])      SURGICAL HISTORY   Past Surgical History:  Procedure Laterality Date  . CORONARY ANGIOPLASTY WITH STENT PLACEMENT    . NO PAST SURGERIES       FAMILY HISTORY   Family History  Problem Relation Age of Onset  . Heart failure Mother   . Kidney failure Brother      SOCIAL HISTORY   Social History   Tobacco Use  . Smoking status: Never Smoker  . Smokeless tobacco: Never Used  Substance Use Topics  . Alcohol use: Yes    Alcohol/week: 2.0 standard drinks    Types: 1 Shots of liquor, 1 Cans of beer per week    Comment: very rarely 1-2 times per month  . Drug use: No     MEDICATIONS   Current Medication:  Current Facility-Administered Medications:  .  0.9 %  sodium chloride infusion, 250 mL, Intravenous, PRN, Demetrios Loll, MD .  acetaminophen (TYLENOL) tablet 650 mg, 650 mg, Oral, Q6H PRN **OR** acetaminophen (TYLENOL) suppository 650 mg, 650 mg, Rectal, Q6H PRN, Demetrios Loll, MD .  albuterol (VENTOLIN HFA) 108 (90 Base) MCG/ACT inhaler 2 puff, 2 puff, Inhalation,  Q4H PRN, Demetrios Loll, MD .  amLODipine (NORVASC) tablet 5 mg, 5 mg, Oral, Daily, Demetrios Loll, MD, 5 mg at 06/15/19 1856 .  atorvastatin (LIPITOR) tablet 40 mg, 40 mg, Oral, QHS, Demetrios Loll, MD, 40 mg at 06/18/19 2218 .  bisacodyl (DULCOLAX) suppository 10 mg, 10 mg, Rectal, Daily PRN, Awilda Bill, NP .  carvedilol (COREG) tablet 25 mg, 25 mg, Oral, Q12H, Demetrios Loll, MD, Stopped at 06/15/19 2220 .  chlorhexidine (PERIDEX) 0.12 % solution 15 mL, 15 mL, Mouth Rinse, BID, Aleskerov, Fuad, MD, 15 mL at 06/18/19 1100 .  cholecalciferol (VITAMIN D3) tablet 2,000 Units, 2,000 Units, Oral, Daily, Charlett Nose, RPH, 2,000 Units at 06/17/19 1053 .  heparin injection 1,000-6,000 Units, 1,000-6,000 Units, CRRT, PRN, Awilda Bill, NP, 2,800 Units at 06/18/19 2000 .  heparin injection 5,000 Units, 5,000 Units, Subcutaneous, Q8H, Demetrios Loll, MD, 5,000 Units at 06/19/19 [phone] .  hydrALAZINE (APRESOLINE) tablet 25 mg, 25 mg, Oral, Q8H, Demetrios Loll, MD, Stopped at 06/15/19 2220 .  hydrocerin (EUCERIN) cream, , Topical, TID, Ottie Glazier, MD .  HYDROcodone-acetaminophen (NORCO/VICODIN) 5-325 MG per tablet 1-2 tablet, 1-2 tablet, Oral, Q4H PRN, Demetrios Loll, MD, 1 tablet at 06/17/19 1449 .  insulin aspart (novoLOG) injection 0-15 Units, 0-15 Units, Subcutaneous, TID WC, Ottie Glazier, MD, 2 Units at 06/17/19 1320 .  insulin aspart (novoLOG) injection 0-5 Units, 0-5 Units, Subcutaneous, QHS, Aleskerov, Fuad, MD .  insulin glargine (LANTUS) injection 20 Units, 20 Units, Subcutaneous,  Daily, Ottie Glazier, MD, 20 Units at 06/18/19 1100 .  isosorbide mononitrate (IMDUR) 24 hr tablet 60 mg, 60 mg, Oral, Daily, Demetrios Loll, MD, 60 mg at 06/15/19 1857 .  MEDLINE mouth rinse, 15 mL, Mouth Rinse, BID, Aleskerov, Fuad, MD, 15 mL at 06/18/19 1100 .  MEDLINE mouth rinse, 15 mL, Mouth Rinse, q12n4p, Aleskerov, Fuad, MD, 15 mL at 06/18/19 1111 .  ondansetron (ZOFRAN) tablet 4 mg, 4 mg, Oral, Q6H PRN **OR** ondansetron  (ZOFRAN) injection 4 mg, 4 mg, Intravenous, Q6H PRN, Demetrios Loll, MD, 4 mg at 06/17/19 1547 .  pureflow IV solution for Dialysis, , CRRT, Continuous, [name] D, NP, Last Rate: 2,000 mL/hr at 06/19/19 0445 .  senna-docusate (Senokot-S) tablet 1 tablet, 1 tablet, Oral, QHS PRN, Demetrios Loll, MD .  sodium chloride flush (NS) 0.9 % injection 3 mL, 3 mL, Intravenous, Q12H, Demetrios Loll, MD, 3 mL at 06/18/19 2229 .  sodium chloride flush (NS) 0.9 % injection 3 mL, 3 mL, Intravenous, PRN, Demetrios Loll, MD .  traZODone (DESYREL) tablet 25-50 mg, 25-50 mg, Oral, QHS PRN, Demetrios Loll, MD .  umeclidinium-vilanterol [facility] ELLIPTA) 62.5-25 MCG/INH 1 puff, 1 puff, Inhalation, Daily, Demetrios Loll, MD, 1 puff at 06/17/19 1054    ALLERGIES   Shellfish allergy    REVIEW OF SYSTEMS    10 point ROS conducted is negative except for hunger  PHYSICAL EXAMINATION   Vitals:   06/19/19 0500 06/19/19 0600  BP: 107/61 114/77  Pulse: 85 81  Resp: 20 14  Temp:    SpO2: 100% 98%    GENERAL: Mild distress due to shortness of breath and lower extremity pain HEAD: Normocephalic, atraumatic.  EYES: Pupils equal, round, reactive to light.  No scleral icterus.  MOUTH: Moist mucosal membrane. NECK: Supple. No thyromegaly. No nodules. No JVD.  PULMONARY: Decreased breath sounds bilaterally CARDIOVASCULAR: S1 and S2. Regular rate and rhythm. No murmurs, rubs, or gallops.  GASTROINTESTINAL: Soft, nontender, non-distended. No masses. Positive bowel sounds. No hepatosplenomegaly.  MUSCULOSKELETAL: No swelling, clubbing, or 3+ edema NEUROLOGIC: Mild distress due to acute illness SKIN:intact,warm,dry   LABS AND IMAGING     LAB RESULTS: Recent Labs  Lab 06/18/19 1812 06/18/19 2204 06/19/19 0148  NA 136 135 135  K 3.7 3.7 3.5  CL 101 101 101  CO2 26 26 26   BUN 23* 23* 19  CREATININE 3.73* 3.87* 3.28*  GLUCOSE 94 88 97   Recent Labs  Lab 06/17/19 0526 06/18/19 0420 06/19/19 0558  HGB 9.6* 9.5* 8.8*   HCT 32.0* 31.9* 28.8*  WBC 11.8* 14.4* 9.3  PLT 201 200 156     IMAGING RESULTS: Dg Abd 1 View  Result Date: 06/18/2019 CLINICAL DATA:  Acute generalized abdominal pain. EXAM: ABDOMEN - 1 VIEW COMPARISON:  None. FINDINGS: The bowel gas pattern is normal. No radio-opaque calculi or other significant radiographic abnormality are seen. IMPRESSION: No evidence of bowel obstruction or ileus. Electronically Signed   By: Marijo Conception M.D.   On: 06/18/2019 17:04   Dg Chest Port 1 View  Result Date: 06/18/2019 CLINICAL DATA:  Acute respiratory failure EXAM: PORTABLE CHEST 1 VIEW COMPARISON:  06/15/2019 FINDINGS: Cardiac shadow is enlarged but stable. No pneumothorax is noted. Mild central vascular congestion is seen increased from the prior exam. No focal infiltrate is noted. No bony abnormality is seen. IMPRESSION: Mild vascular congestion. Electronically Signed   By: Inez Catalina M.D.   On: 06/18/2019 10:58      ASSESSMENT AND  PLAN    -Multidisciplinary rounds held today  Acute Hypoxic Respiratory Failure Resolved now  -Likely due to pulmonary edema from fluid overload secondary to AKI -s/p dialysis treatment via temp trialysis catheter -INR and PTT pending -needs bipap at home will discuss with [employer] for eval  Severe hyperkalemia -now resolved post emergent HD -Status post EKG -Duo nebs, Lasix calcium gluconate -Plan for emergent HD    Renal Failure-Acute on chronic renal failure stage IV -Likely due to hypertensive and diabetic nephropathy with noncompliance -Nephrology on case-appreciate input following recommendations -Emergent HD -follow chem 7 -follow UO -continue Foley Catheter-assess need daily    Obstructive sleep apnea -May provide CPAP with home settings as per RT   GI/Nutrition GI PROPHYLAXIS as indicated DIET-->TF's as tolerated Constipation protocol as indicated  ENDO - ICU hypoglycemic\Hyperglycemia protocol -check FSBS per protocol    ELECTROLYTES -follow labs as needed -replace as needed -pharmacy consultation   DVT/GI PRX ordered -SCDs  TRANSFUSIONS AS NEEDED MONITOR FSBS ASSESS the need for LABS as needed   Critical care provider statement:    Critical care time (minutes):  31   Critical care time was exclusive of:  Separately billable procedures and treating other patients   Critical care was necessary to treat or prevent imminent or life-threatening deterioration of the following conditions:   Severe hyperkalemia, acute on chronic renal insufficiency stage IV, morbid obesity, pulmonary hypertension, obstructive sleep apnea   Critical care was time spent personally by me on the following activities:  Development of treatment plan with patient or surrogate, discussions with consultants, evaluation of patient's response to treatment, examination of patient, obtaining history from patient or surrogate, ordering and performing treatments and interventions, ordering and review of laboratory studies and re-evaluation of patient's condition.  I assumed direction of critical care for this patient from another provider in my specialty: no    This document was prepared using Dragon voice recognition software and may include unintentional dictation errors.    Ottie Glazier, M.D.  Division of [place]

## 2019-06-19 NOTE — Progress Notes (Addendum)
CRRT Medication Adjustments:   Pharmacy consulted for CRRT medication adjustments for 53 yo male requiring CRRT. No adjustments warranted at this time.   Patient transitioned to 4K bath from 2K bath. In consultation with nephrology magnesium 4g IV x 1 and potassium phosphate 29mmol IV x 1.   Will continue to monitor.   Currie Paris, [place]  06.30.2020 [phone]

## 2019-06-20 LAB — GLUCOSE, CAPILLARY
Glucose-Capillary: 106 mg/dL — ABNORMAL HIGH (ref 70–99)
Glucose-Capillary: 157 mg/dL — ABNORMAL HIGH (ref 70–99)
Glucose-Capillary: 140 mg/dL — ABNORMAL HIGH (ref 70–99)
Glucose-Capillary: 164 mg/dL — ABNORMAL HIGH (ref 70–99)

## 2019-06-20 LAB — RENAL FUNCTION PANEL
Albumin: 2 g/dL — ABNORMAL LOW (ref 3.5–5.0)
Albumin: 2 g/dL — ABNORMAL LOW (ref 3.5–5.0)
Albumin: 2 g/dL — ABNORMAL LOW (ref 3.5–5.0)
Albumin: 2 g/dL — ABNORMAL LOW (ref 3.5–5.0)
Albumin: 2.1 g/dL — ABNORMAL LOW (ref 3.5–5.0)
Albumin: 2 g/dL — ABNORMAL LOW (ref 3.5–5.0)
Anion gap: 5 (ref 5–15)
Anion gap: 7 (ref 5–15)
Anion gap: 10 (ref 5–15)
Anion gap: 7 (ref 5–15)
Anion gap: 7 (ref 5–15)
Anion gap: 6 (ref 5–15)
BUN: 16 mg/dL (ref 6–20)
BUN: 15 mg/dL (ref 6–20)
BUN: 15 mg/dL (ref 6–20)
BUN: 15 mg/dL (ref 6–20)
BUN: 15 mg/dL (ref 6–20)
BUN: 15 mg/dL (ref 6–20)
CO2: 27 mmol/L (ref 22–32)
CO2: 27 mmol/L (ref 22–32)
CO2: 26 mmol/L (ref 22–32)
CO2: 27 mmol/L (ref 22–32)
CO2: 27 mmol/L (ref 22–32)
CO2: 27 mmol/L (ref 22–32)
Calcium: 8.4 mg/dL — ABNORMAL LOW (ref 8.9–10.3)
Calcium: 8.6 mg/dL — ABNORMAL LOW (ref 8.9–10.3)
Calcium: 8.5 mg/dL — ABNORMAL LOW (ref 8.9–10.3)
Calcium: 8.5 mg/dL — ABNORMAL LOW (ref 8.9–10.3)
Calcium: 8.4 mg/dL — ABNORMAL LOW (ref 8.9–10.3)
Calcium: 8.3 mg/dL — ABNORMAL LOW (ref 8.9–10.3)
Chloride: 102 mmol/L (ref 98–111)
Chloride: 101 mmol/L (ref 98–111)
Chloride: 99 mmol/L (ref 98–111)
Chloride: 99 mmol/L (ref 98–111)
Chloride: 101 mmol/L (ref 98–111)
Chloride: 102 mmol/L (ref 98–111)
Creatinine, Ser: 2.49 mg/dL — ABNORMAL HIGH (ref 0.61–1.24)
Creatinine, Ser: 2.67 mg/dL — ABNORMAL HIGH (ref 0.61–1.24)
Creatinine, Ser: 2.66 mg/dL — ABNORMAL HIGH (ref 0.61–1.24)
Creatinine, Ser: 2.75 mg/dL — ABNORMAL HIGH (ref 0.61–1.24)
Creatinine, Ser: 2.54 mg/dL — ABNORMAL HIGH (ref 0.61–1.24)
Creatinine, Ser: 2.36 mg/dL — ABNORMAL HIGH (ref 0.61–1.24)
GFR calc Af Amer: 34 mL/min — ABNORMAL LOW (ref >60)
GFR calc Af Amer: 31 mL/min — ABNORMAL LOW (ref >60)
GFR calc Af Amer: 31 mL/min — ABNORMAL LOW (ref >60)
GFR calc Af Amer: 30 mL/min — ABNORMAL LOW (ref >60)
GFR calc Af Amer: 33 mL/min — ABNORMAL LOW (ref >60)
GFR calc Af Amer: 36 mL/min — ABNORMAL LOW (ref >60)
GFR calc non Af Amer: 29 mL/min — ABNORMAL LOW (ref >60)
GFR calc non Af Amer: 27 mL/min — ABNORMAL LOW (ref >60)
GFR calc non Af Amer: 27 mL/min — ABNORMAL LOW (ref >60)
GFR calc non Af Amer: 26 mL/min — ABNORMAL LOW (ref >60)
GFR calc non Af Amer: 28 mL/min — ABNORMAL LOW (ref >60)
GFR calc non Af Amer: 31 mL/min — ABNORMAL LOW (ref >60)
Glucose, Bld: 132 mg/dL — ABNORMAL HIGH (ref 70–99)
Glucose, Bld: 128 mg/dL — ABNORMAL HIGH (ref 70–99)
Glucose, Bld: 158 mg/dL — ABNORMAL HIGH (ref 70–99)
Glucose, Bld: 162 mg/dL — ABNORMAL HIGH (ref 70–99)
Glucose, Bld: 177 mg/dL — ABNORMAL HIGH (ref 70–99)
Glucose, Bld: 154 mg/dL — ABNORMAL HIGH (ref 70–99)
Phosphorus: 2 mg/dL — ABNORMAL LOW (ref 2.5–4.6)
Phosphorus: 2.2 mg/dL — ABNORMAL LOW (ref 2.5–4.6)
Phosphorus: 2.3 mg/dL — ABNORMAL LOW (ref 2.5–4.6)
Phosphorus: 2.1 mg/dL — ABNORMAL LOW (ref 2.5–4.6)
Phosphorus: 2.2 mg/dL — ABNORMAL LOW (ref 2.5–4.6)
Phosphorus: 2.7 mg/dL (ref 2.5–4.6)
Potassium: 3.8 mmol/L (ref 3.5–5.1)
Potassium: 3.9 mmol/L (ref 3.5–5.1)
Potassium: 3.8 mmol/L (ref 3.5–5.1)
Potassium: 3.8 mmol/L (ref 3.5–5.1)
Potassium: 3.8 mmol/L (ref 3.5–5.1)
Potassium: 3.9 mmol/L (ref 3.5–5.1)
Sodium: 134 mmol/L — ABNORMAL LOW (ref 135–145)
Sodium: 135 mmol/L (ref 135–145)
Sodium: 135 mmol/L (ref 135–145)
Sodium: 133 mmol/L — ABNORMAL LOW (ref 135–145)
Sodium: 135 mmol/L (ref 135–145)
Sodium: 135 mmol/L (ref 135–145)

## 2019-06-20 LAB — CBC WITH DIFFERENTIAL/PLATELET
Abs Immature Granulocytes: 0.29 10*3/uL — ABNORMAL HIGH (ref 0.00–0.07)
Basophils Absolute: 0.1 10*3/uL (ref 0.0–0.1)
Basophils Relative: 1 %
Eosinophils Absolute: 0.4 10*3/uL (ref 0.0–0.5)
Eosinophils Relative: 5 %
HCT: 28.2 % — ABNORMAL LOW (ref 39.0–52.0)
Hemoglobin: 8.5 g/dL — ABNORMAL LOW (ref 13.0–17.0)
Immature Granulocytes: 4 %
Lymphocytes Relative: 17 %
Lymphs Abs: 1.4 10*3/uL (ref 0.7–4.0)
MCH: 23.9 pg — ABNORMAL LOW (ref 26.0–34.0)
MCHC: 30.1 g/dL (ref 30.0–36.0)
MCV: 79.4 fL — ABNORMAL LOW (ref 80.0–100.0)
Monocytes Absolute: 0.8 10*3/uL (ref 0.1–1.0)
Monocytes Relative: 10 %
Neutro Abs: 5.2 10*3/uL (ref 1.7–7.7)
Neutrophils Relative %: 63 %
Platelets: 137 10*3/uL — ABNORMAL LOW (ref 150–400)
RBC: 3.55 MIL/uL — ABNORMAL LOW (ref 4.22–5.81)
RDW: 18.7 % — ABNORMAL HIGH (ref 11.5–15.5)
WBC: 8.2 10*3/uL (ref 4.0–10.5)
nRBC: 0.2 % (ref 0.0–0.2)

## 2019-06-20 LAB — CULTURE, BLOOD (ROUTINE X 2)
Culture: NO GROWTH
Culture: NO GROWTH
Special Requests: ADEQUATE
Special Requests: ADEQUATE

## 2019-06-20 LAB — MAGNESIUM
Magnesium: 2.3 mg/dL (ref 1.7–2.4)
Magnesium: 2.3 mg/dL (ref 1.7–2.4)
Magnesium: 2.1 mg/dL (ref 1.7–2.4)
Magnesium: 2 mg/dL (ref 1.7–2.4)
Magnesium: 1.9 mg/dL (ref 1.7–2.4)
Magnesium: 2 mg/dL (ref 1.7–2.4)

## 2019-06-20 LAB — APTT: aPTT: 43 seconds — ABNORMAL HIGH (ref 24–36)

## 2019-06-20 MED ORDER — SODIUM CHLORIDE 0.9% FLUSH
10.0000 mL | Freq: Two times a day (BID) | INTRAVENOUS | Status: DC
  Administered 2019-06-21 – 2019-06-24 (×6): 10 mL

## 2019-06-20 MED ORDER — SODIUM CHLORIDE 0.9% FLUSH: 10.0000 mL | INTRAVENOUS | Status: DC | PRN

## 2019-06-20 MED ORDER — K PHOS MONO-SOD PHOS DI & MONO 155-852-130 MG PO TABS
500.0000 mg | ORAL_TABLET | Freq: Once | ORAL | Status: AC
  Administered 2019-06-20: 500 mg via ORAL
  Filled 2019-06-20: qty 2

## 2019-06-20 MED ORDER — K PHOS MONO-SOD PHOS DI & MONO 155-852-130 MG PO TABS
500.0000 mg | ORAL_TABLET | Freq: Three times a day (TID) | ORAL | Status: AC
  Administered 2019-06-20 (×2): 500 mg via ORAL
  Filled 2019-06-20 (×2): qty 2

## 2019-06-20 NOTE — Progress Notes (Signed)
CRITICAL CARE NOTE      CHIEF COMPLAINT:   Acute Kidney Injury and severe hyperkalemia   Subjective    53 yo with DM, HTN, CKD admits too medication noncompliance came in with florid pulm edema and aki creat 16, severe hyperkalemia >7.  Required emergent HD     Hypoxemia resolved CKD imporved post HD Mentation has improved significantly.  Will optimize for downgrade to medical floor.    PAST MEDICAL HISTORY   Past Medical History:  Diagnosis Date  . Bell's palsy   . CHF (congestive heart failure) ([place])   . Chronic kidney disease   . Diabetes mellitus without complication ([place])   . Hypertension   . NSVT (nonsustained ventricular tachycardia) ([place])   . Obstructive sleep apnea   . Psoriasis   . Pulmonary HTN ([place])      SURGICAL HISTORY   Past Surgical History:  Procedure Laterality Date  . CORONARY ANGIOPLASTY WITH STENT PLACEMENT    . NO PAST SURGERIES       FAMILY HISTORY   Family History  Problem Relation Age of Onset  . Heart failure Mother   . Kidney failure Brother      SOCIAL HISTORY   Social History   Tobacco Use  . Smoking status: Never Smoker  . Smokeless tobacco: Never Used  Substance Use Topics  . Alcohol use: Yes    Alcohol/week: 2.0 standard drinks    Types: 1 Shots of liquor, 1 Cans of beer per week    Comment: very rarely 1-2 times per month  . Drug use: No     MEDICATIONS   Current Medication:  Current Facility-Administered Medications:  .  0.9 %  sodium chloride infusion, 250 mL, Intravenous, PRN, Demetrios Loll, MD .  acetaminophen (TYLENOL) tablet 650 mg, 650 mg, Oral, Q6H PRN **OR** acetaminophen (TYLENOL) suppository 650 mg, 650 mg, Rectal, Q6H PRN, Demetrios Loll, MD .  albuterol (VENTOLIN HFA) 108 (90 Base) MCG/ACT inhaler 2 puff, 2 puff, Inhalation,  Q4H PRN, Demetrios Loll, MD .  amLODipine (NORVASC) tablet 5 mg, 5 mg, Oral, Daily, Demetrios Loll, MD, 5 mg at 06/19/19 1039 .  atorvastatin (LIPITOR) tablet 40 mg, 40 mg, Oral, QHS, Demetrios Loll, MD, 40 mg at 06/19/19 2208 .  bisacodyl (DULCOLAX) suppository 10 mg, 10 mg, Rectal, Daily PRN, Awilda Bill, NP .  carvedilol (COREG) tablet 25 mg, 25 mg, Oral, Q12H, Demetrios Loll, MD, 25 mg at 06/19/19 2209 .  chlorhexidine (PERIDEX) 0.12 % solution 15 mL, 15 mL, Mouth Rinse, BID, Lanney Gins, Fuad, MD, 15 mL at 06/19/19 2207 .  cholecalciferol (VITAMIN D3) tablet 2,000 Units, 2,000 Units, Oral, Daily, Charlett Nose, RPH, 2,000 Units at 06/19/19 1040 .  heparin injection 1,000-6,000 Units, 1,000-6,000 Units, CRRT, PRN, Awilda Bill, NP, 2,800 Units at 06/18/19 2000 .  heparin injection 5,000 Units, 5,000 Units, Subcutaneous, Q8H, Demetrios Loll, MD, 5,000 Units at 06/20/19 [phone]  hydrALAZINE (APRESOLINE) tablet 25 mg, 25 mg, Oral, Q8H, Demetrios Loll, MD, 25 mg at 06/20/19 0548 .  hydrocerin (EUCERIN) cream, , Topical, TID, Aleskerov, Fuad, MD .  insulin aspart (novoLOG) injection 0-15 Units, 0-15 Units, Subcutaneous, TID WC, Ottie Glazier, MD, 2 Units at 06/19/19 1706 .  insulin aspart (novoLOG) injection 0-5 Units, 0-5 Units, Subcutaneous, QHS, Aleskerov, Fuad, MD .  isosorbide mononitrate (IMDUR) 24 hr tablet 60 mg, 60 mg, Oral, Daily, Demetrios Loll, MD, 60 mg at 06/19/19 1039 .  MEDLINE mouth rinse, 15 mL, Mouth Rinse, BID,  Ottie Glazier, MD, 15 mL at 06/19/19 2209 .  MEDLINE mouth rinse, 15 mL, Mouth Rinse, q12n4p, Aleskerov, Fuad, MD, 15 mL at 06/19/19 1707 .  ondansetron (ZOFRAN) tablet 4 mg, 4 mg, Oral, Q6H PRN **OR** ondansetron (ZOFRAN) injection 4 mg, 4 mg, Intravenous, Q6H PRN, Demetrios Loll, MD, 4 mg at 06/17/19 1547 .  oxyCODONE-acetaminophen (PERCOCET/ROXICET) 5-325 MG per tablet 1-2 tablet, 1-2 tablet, Oral, Q6H PRN, Awilda Bill, NP, 1 tablet at 06/19/19 1811 .  pureflow IV solution for  Dialysis, , CRRT, Continuous, Lateef, Munsoor, MD, Last Rate: 2,000 mL/hr at 06/20/19 0634 .  senna-docusate (Senokot-S) tablet 1 tablet, 1 tablet, Oral, QHS PRN, Demetrios Loll, MD .  sodium chloride flush (NS) 0.9 % injection 3 mL, 3 mL, Intravenous, Q12H, Demetrios Loll, MD, 3 mL at 06/19/19 2209 .  sodium chloride flush (NS) 0.9 % injection 3 mL, 3 mL, Intravenous, PRN, Demetrios Loll, MD .  traZODone (DESYREL) tablet 25-50 mg, 25-50 mg, Oral, QHS PRN, Demetrios Loll, MD .  umeclidinium-vilanterol [facility] ELLIPTA) 62.5-25 MCG/INH 1 puff, 1 puff, Inhalation, Daily, Demetrios Loll, MD, 1 puff at 06/19/19 1042    ALLERGIES   Shellfish allergy    REVIEW OF SYSTEMS    10 point ROS conducted is negative except for hunger  PHYSICAL EXAMINATION   Vitals:   06/20/19 0600 06/20/19 0700  BP: (!) 148/84 135/90  Pulse: 71 68  Resp: 10 16  Temp:    SpO2: 100% 99%    GENERAL: Mild distress due to shortness of breath and lower extremity pain HEAD: Normocephalic, atraumatic.  EYES: Pupils equal, round, reactive to light.  No scleral icterus.  MOUTH: Moist mucosal membrane. NECK: Supple. No thyromegaly. No nodules. No JVD.  PULMONARY: Decreased breath sounds bilaterally CARDIOVASCULAR: S1 and S2. Regular rate and rhythm. No murmurs, rubs, or gallops.  GASTROINTESTINAL: Soft, nontender, non-distended. No masses. Positive bowel sounds. No hepatosplenomegaly.  MUSCULOSKELETAL: No swelling, clubbing, or 3+ edema NEUROLOGIC: Mild distress due to acute illness SKIN:intact,warm,dry   LABS AND IMAGING     LAB RESULTS: Recent Labs  Lab 06/19/19 2224 06/20/19 0238 06/20/19 0549  NA 133* 134* 135  K 3.7 3.8 3.9  CL 100 102 101  CO2 27 27 27   BUN 17 16 15   CREATININE 2.70* 2.49* 2.67*  GLUCOSE 158* 132* 128*   Recent Labs  Lab 06/18/19 0420 06/19/19 0558 06/20/19 0549  HGB 9.5* 8.8* 8.5*  HCT 31.9* 28.8* 28.2*  WBC 14.4* 9.3 8.2  PLT 200 156 137*     IMAGING RESULTS: No results found.     ASSESSMENT AND PLAN    -Multidisciplinary rounds held today  Acute Hypoxic Respiratory Failure Resolved now  -Likely due to pulmonary edema from fluid overload secondary to AKI -s/p dialysis treatment via temp trialysis catheter -INR and PTT pending -needs bipap at home will discuss with [employer] for eval  Severe hyperkalemia -now resolved post emergent HD -Status post EKG -Duo nebs, Lasix calcium gluconate -Plan for emergent HD    Renal Failure-Acute on chronic renal failure stage IV -Likely due to hypertensive and diabetic nephropathy with noncompliance -Nephrology on case-appreciate input following recommendations -Emergent HD -follow chem 7 -follow UO -continue Foley Catheter-assess need daily    Obstructive sleep apnea -May provide CPAP with home settings as per RT   GI/Nutrition GI PROPHYLAXIS as indicated DIET-->TF's as tolerated Constipation protocol as indicated  ENDO - ICU hypoglycemic\Hyperglycemia protocol -check FSBS per protocol   ELECTROLYTES -follow labs as needed -replace as  needed -pharmacy consultation   DVT/GI PRX ordered -SCDs  TRANSFUSIONS AS NEEDED MONITOR FSBS ASSESS the need for LABS as needed   Critical care provider statement:    Critical care time (minutes):  33   Critical care time was exclusive of:  Separately billable procedures and treating other patients   Critical care was necessary to treat or prevent imminent or life-threatening deterioration of the following conditions:   Severe hyperkalemia, acute on chronic renal insufficiency stage IV, morbid obesity, pulmonary hypertension, obstructive sleep apnea   Critical care was time spent personally by me on the following activities:  Development of treatment plan with patient or surrogate, discussions with consultants, evaluation of patient's response to treatment, examination of patient, obtaining history from patient or surrogate, ordering and performing treatments and  interventions, ordering and review of laboratory studies and re-evaluation of patient's condition.  I assumed direction of critical care for this patient from another provider in my specialty: no    This document was prepared using Dragon voice recognition software and may include unintentional dictation errors.    Ottie Glazier, M.D.  Division of [facility]

## 2019-06-20 NOTE — Progress Notes (Signed)
CRRT Medication Adjustments:   Pharmacy consulted for CRRT medication adjustments for 53 yo male requiring CRRT: no adjustments warranted at this time.   Patient transitioned to 4K bath from 2K bath  500 mg K Phos neutral x 2 ordered  Will continue to monitor.   Vallery Sa, PharmD 06.30.2020 [phone]

## 2019-06-20 NOTE — Progress Notes (Signed)
[facility]  ROUNDING NOTE   Subjective:  Patient still on CRRT. No significant urine output yesterday. Denies shortness of breath at the moment. Ultrafiltration achieved was 1.6 L over the preceding 24 hours.   Objective:  Vital signs in last 24 hours:  Temp:  [96.1 F (35.6 C)-98.3 F (36.8 C)] 98.3 F (36.8 C) (07/04 1150) Pulse Rate:  [66-90] 80 (07/04 1312) Resp:  [9-21] 17 (07/04 1312) BP: (98-151)/(70-99) 104/79 (07/04 1312) SpO2:  [91 %-100 %] 95 % (07/04 1312) FiO2 (%):  [40 %] 40 % (07/04 0700) Weight:  [128.6 kg] 128.6 kg (07/04 0244)  Weight change: 0.5 kg Filed Weights   06/18/19 0424 06/19/19 0437 06/20/19 0244  Weight: 127.3 kg 128.1 kg 128.6 kg    Intake/Output: I/O last 3 completed shifts: In: 1130.4 [P.O.:435; IV Piggyback:695.4] Out: 2546 [Urine:200; Other:2346]   Intake/Output this shift:  Total I/O In: 290 [P.O.:290] Out: 430 [Other:430]  Physical Exam: General: No acute distress  Head: Normocephalic, atraumatic. Moist oral mucosal membranes  Eyes: Anicteric  Neck: Supple, trachea midline  Lungs:  Scattered rhonchi, on BiPAP  Heart: S1S2 no rubs  Abdomen:  Soft, nontender, bowel sounds present  Extremities: 2+ peripheral edema.  Neurologic: Awake, alert, conversant  Skin: No lesions  Access: Left femoral vein dialysis catheter    Basic Metabolic Panel: Recent Labs  Lab 06/19/19 1947 06/19/19 2224 06/20/19 0238 06/20/19 0549 06/20/19 1029 06/20/19 1030  NA 133* 133* 134* 135  --  135  K 3.8 3.7 3.8 3.9  --  3.8  CL 100 100 102 101  --  99  CO2 27 27 27 27   --  26  GLUCOSE 194* 158* 132* 128*  --  158*  BUN 19 17 16 15   --  15  CREATININE 2.91* 2.70* 2.49* 2.67*  --  2.66*  CALCIUM 8.2* 8.1* 8.4* 8.6*  --  8.5*  MG 2.2 2.2 2.3 2.3 2.1  --   PHOS 2.7 2.5 2.0* 2.2*  --  2.3*    Liver Function Tests: Recent Labs  Lab 06/15/19 1348  06/19/19 1947 06/19/19 2224 06/20/19 0238 06/20/19 0549 06/20/19 1030  AST  12*  --   --   --   --   --   --   ALT 11  --   --   --   --   --   --   ALKPHOS 140*  --   --   --   --   --   --   BILITOT 0.6  --   --   --   --   --   --   PROT 6.7  --   --   --   --   --   --   ALBUMIN 2.4*   < > 1.8* 1.9* 2.0* 2.0* 2.0*   < > = values in this interval not displayed.   No results for input(s): LIPASE, AMYLASE in the last 168 hours. No results for input(s): AMMONIA in the last 168 hours.  CBC: Recent Labs  Lab 06/15/19 1348 06/16/19 0223 06/17/19 0526 06/18/19 0420 06/19/19 0558 06/20/19 0549  WBC 12.8* 12.8* 11.8* 14.4* 9.3 8.2  NEUTROABS 9.8*  --   --   --  6.6 5.2  HGB 9.8* 9.1* 9.6* 9.5* 8.8* 8.5*  HCT 32.7* 30.0* 32.0* 31.9* 28.8* 28.2*  MCV 78.4* 77.3* 78.8* 79.0* 78.5* 79.4*  PLT 236 233 201 200 156 137*    Cardiac Enzymes: No  results for input(s): CKTOTAL, CKMB, CKMBINDEX, TROPONINI in the last 168 hours.  BNP: Invalid input(s): POCBNP  CBG: Recent Labs  Lab 06/19/19 1221 06/19/19 1554 06/19/19 2212 06/20/19 0736 06/20/19 1150  GLUCAP 108* 146* 154* 106* 157*    Microbiology: Results for orders placed or performed during the hospital encounter of 06/15/19  Blood culture (routine x 2)     Status: None   Collection Time: 06/15/19  1:48 PM   Specimen: BLOOD  Result Value Ref Range Status   Specimen Description BLOOD BLOOD LEFT ARM  Final   Special Requests   Final    BOTTLES DRAWN AEROBIC AND ANAEROBIC Blood Culture adequate volume   Culture   Final    NO GROWTH 5 DAYS Performed at [facility], [address], [place], [place] [address]    Report Status 06/20/2019 FINAL  Final  SARS Coronavirus 2 (CEPHEID- Performed in [place] hospital lab), Hosp Order     Status: None   Collection Time: 06/15/19  1:57 PM   Specimen: Nasopharyngeal Swab  Result Value Ref Range Status   SARS Coronavirus 2 NEGATIVE NEGATIVE Final    Comment: (NOTE) If result is NEGATIVE SARS-CoV-2 target nucleic acids are NOT DETECTED. The  SARS-CoV-2 RNA is generally detectable in upper and lower  respiratory specimens during the acute phase of infection. The lowest  concentration of SARS-CoV-2 viral copies this assay can detect is 250  copies / mL. A negative result does not preclude SARS-CoV-2 infection  and should not be used as the sole basis for treatment or other  patient management decisions.  A negative result may occur with  improper specimen collection / handling, submission of specimen other  than nasopharyngeal swab, presence of viral mutation(s) within the  areas targeted by this assay, and inadequate number of viral copies  (<250 copies / mL). A negative result must be combined with clinical  observations, patient history, and epidemiological information. If result is POSITIVE SARS-CoV-2 target nucleic acids are DETECTED. The SARS-CoV-2 RNA is generally detectable in upper and lower  respiratory specimens dur ing the acute phase of infection.  Positive  results are indicative of active infection with SARS-CoV-2.  Clinical  correlation with patient history and other diagnostic information is  necessary to determine patient infection status.  Positive results do  not rule out bacterial infection or co-infection with other viruses. If result is PRESUMPTIVE POSTIVE SARS-CoV-2 nucleic acids MAY BE PRESENT.   A presumptive positive result was obtained on the submitted specimen  and confirmed on repeat testing.  While 2019 novel coronavirus  (SARS-CoV-2) nucleic acids may be present in the submitted sample  additional confirmatory testing may be necessary for epidemiological  and / or clinical management purposes  to differentiate between  SARS-CoV-2 and other Sarbecovirus currently known to infect humans.  If clinically indicated additional testing with an alternate test  methodology [phone]) is advised. The SARS-CoV-2 RNA is generally  detectable in upper and lower respiratory sp ecimens during the acute   phase of infection. The expected result is Negative. Fact Sheet for Patients:  StrictlyIdeas.no Fact Sheet for Healthcare Providers: BankingDealers.co.za This test is not yet approved or cleared by the [place] FDA and has been authorized for detection and/or diagnosis of SARS-CoV-2 by FDA under an Emergency Use Authorization (EUA).  This EUA will remain in effect (meaning this test can be used) for the duration of the COVID-19 declaration under Section 564(b)(1) of the Act, 21 U.S.C. section 360bbb-3(b)(1), unless  the authorization is terminated or revoked sooner. Performed at [facility], [address] [place], [place] [address]   Blood culture (routine x 2)     Status: None   Collection Time: 06/15/19  4:40 PM   Specimen: BLOOD  Result Value Ref Range Status   Specimen Description BLOOD BLOOD RIGHT ARM  Final   Special Requests   Final    BOTTLES DRAWN AEROBIC AND ANAEROBIC Blood Culture adequate volume   Culture   Final    NO GROWTH 5 DAYS Performed at [facility], [address], [place], [place] [address]    Report Status 06/20/2019 FINAL  Final    Coagulation Studies: No results for input(s): LABPROT, INR in the last 72 hours.  Urinalysis: No results for input(s): COLORURINE, LABSPEC, PHURINE, GLUCOSEU, HGBUR, BILIRUBINUR, KETONESUR, PROTEINUR, UROBILINOGEN, NITRITE, LEUKOCYTESUR in the last 72 hours.  Invalid input(s): APPERANCEUR    Imaging: Dg Abd 1 View  Result Date: 06/18/2019 CLINICAL DATA:  Acute generalized abdominal pain. EXAM: ABDOMEN - 1 VIEW COMPARISON:  None. FINDINGS: The bowel gas pattern is normal. No radio-opaque calculi or other significant radiographic abnormality are seen. IMPRESSION: No evidence of bowel obstruction or ileus. Electronically Signed   By: Marijo Conception M.D.   On: 06/18/2019 17:04     Medications:   . pureflow 2,000 mL/hr at 06/20/19 1230   .  amLODipine  5 mg Oral Daily  . atorvastatin  40 mg Oral QHS  . carvedilol  25 mg Oral Q12H  . chlorhexidine  15 mL Mouth Rinse BID  . cholecalciferol  2,000 Units Oral Daily  . heparin  5,000 Units Subcutaneous Q8H  . hydrALAZINE  25 mg Oral Q8H  . hydrocerin   Topical TID  . insulin aspart  0-15 Units Subcutaneous TID WC  . insulin aspart  0-5 Units Subcutaneous QHS  . isosorbide mononitrate  60 mg Oral Daily  . mouth rinse  15 mL Mouth Rinse BID  . mouth rinse  15 mL Mouth Rinse q12n4p  . [START ON 06/21/2019] sodium chloride flush  10 mL Intracatheter Q12H  . umeclidinium-vilanterol  1 puff Inhalation Daily   acetaminophen **OR** acetaminophen, albuterol, bisacodyl, heparin, ondansetron **OR** ondansetron (ZOFRAN) IV, oxyCODONE-acetaminophen, senna-docusate, sodium chloride flush, traZODone  Assessment/ Plan:  53 y.o. male with a PMHx of Bell's palsy, congestive heart failure, diabetes mellitus type 2, hypertension, obstructive sleep apnea, psoriasis, pulmonary hypertension, chronic kidney disease stage II, who was admitted to [facility] on 06/15/2019 for evaluation of severe acute renal failure in the setting of generalized weakness.   1.  Acute renal failure/chronic kidney disease stage II Baseline EGFR 64 with suspected dehydration/diabetes mellitus type 2 with chronic kidney disease. -Patient remains oliguric.  Ultrafiltration achieved was 1.6 L over the preceding 24 hours.  We will maintain the patient on CRRT at this time.  Consider taking off early next week.  2.  Acute respiratory failure.  Patient off BiPAP at the moment.  He received CPAP at night.  He is breathing comfortably currently.   LOS: 5 Munsoor Lateef 7/4/20202:19 PM

## 2019-06-20 NOTE — Progress Notes (Signed)
[place] [facility] [place] NAME: [name]    MR#:  [id number]  DATE OF BIRTH:  16-Feb-1966  SUBJECTIVE:   The patient has no complaints, ON oxygen by nasal cannula 3 L.  Still oliguria, on CRRT. REVIEW OF SYSTEMS:    Review of Systems  Constitutional: Positive for malaise/fatigue. Negative for chills and fever.  HENT: Negative for sore throat.   Eyes: Negative for blurred vision and double vision.  Respiratory: Negative for cough, hemoptysis, shortness of breath, wheezing and stridor.   Cardiovascular: Negative for chest pain, palpitations, orthopnea and leg swelling.  Gastrointestinal: Negative for abdominal pain, blood in stool, diarrhea, melena, nausea and vomiting.  Genitourinary: Negative for dysuria, flank pain and hematuria.  Musculoskeletal: Negative for back pain and joint pain.  Neurological: Negative for dizziness, sensory change, focal weakness, seizures, loss of consciousness, weakness and headaches.  Endo/Heme/Allergies: Negative for polydipsia.  Psychiatric/Behavioral: Negative for depression. The patient is not nervous/anxious.     Nutrition: Renal/Carb modified Tolerating Diet: yes Tolerating PT: Await Eval.   DRUG ALLERGIES:   Allergies  Allergen Reactions  . Shellfish Allergy Anaphylaxis    Face and throat swelling, difficulty breathing Allergy can be triggered by touching (contact)    VITALS:  Blood pressure 121/70, pulse 84, temperature 98.3 F (36.8 C), temperature source Oral, resp. rate 20, height 5\' 3"  (1.6 m), weight 128.6 kg, SpO2 97 %.  PHYSICAL EXAMINATION:   Physical Exam  GENERAL:  53 y.o.-year-old obese patient lying in bed in no acute distress.  Morbid obesity. EYES: Pupils equal, round, reactive to light and accommodation. No scleral icterus. Extraocular muscles intact.  HEENT: Head atraumatic, normocephalic. Oropharynx and nasopharynx clear.  NECK:  Supple, no jugular venous distention.  No thyroid enlargement, no tenderness.  LUNGS: Poor Resp. effort, no wheezing, rales, rhonchi. No use of accessory muscles of respiration.  CARDIOVASCULAR: S1, S2 normal. No murmurs, rubs, or gallops.  ABDOMEN: Soft, nontender, nondistended. Bowel sounds present. No organomegaly or mass.  EXTREMITIES: No cyanosis, clubbing, + 1 edema b/l. Signs of chronic venous stasis b/l.     NEUROLOGIC: Cranial nerves II through XII are intact. No focal Motor or sensory deficits b/l.  Globally weak.   PSYCHIATRIC: The patient is alert and oriented x 3.  SKIN: No obvious rash, lesion, or ulcer.   Left groin dialysis catheter in place   LABORATORY PANEL:   CBC Recent Labs  Lab 06/20/19 0549  WBC 8.2  HGB 8.5*  HCT 28.2*  PLT 137*   ------------------------------------------------------------------------------------------------------------------  Chemistries  Recent Labs  Lab 06/15/19 1348  06/20/19 1029 06/20/19 1030  NA 131*   < >  --  135  K 7.2*   < >  --  3.8  CL 97*   < >  --  99  CO2 18*   < >  --  26  GLUCOSE 262*   < >  --  158*  BUN 109*   < >  --  15  CREATININE 16.23*   < >  --  2.66*  CALCIUM 7.1*   < >  --  8.5*  MG  --    < > 2.1  --   AST 12*  --   --   --   ALT 11  --   --   --   ALKPHOS 140*  --   --   --   BILITOT 0.6  --   --   --    < > =  values in this interval not displayed.   ------------------------------------------------------------------------------------------------------------------  Cardiac Enzymes No results for input(s): TROPONINI in the last 168 hours. ------------------------------------------------------------------------------------------------------------------  RADIOLOGY:  Dg Abd 1 View  Result Date: 06/18/2019 CLINICAL DATA:  Acute generalized abdominal pain. EXAM: ABDOMEN - 1 VIEW COMPARISON:  None. FINDINGS: The bowel gas pattern is normal. No radio-opaque calculi or other significant radiographic abnormality are seen. IMPRESSION: No  evidence of bowel obstruction or ileus. Electronically Signed   By: Marijo Conception M.D.   On: 06/18/2019 17:04     ASSESSMENT AND PLAN:   53 year old male with past medical history of hypertension, diabetes, CKD stage III, history of chronic diastolic CHF, obstructive sleep apnea, psoriasis, pulmonary hypertension who presented to the hospital due to acute on chronic renal failure with severe hyperkalemia.  1.  Acute on chronic renal failure- secondary to dehydration, diabetes and underlying CKD. - Patient's baseline creatinines around 1.4 in May of this past year and patient presented to the hospital the creatinine greater than 10. - Remains oliguric/anuric and therefore started on CRRT. - Renal ultrasound was negative for hydronephrosis.  Serologic work-up has been initiated by nephrology. - Continue CRRT per Dr. Holley Raring.  Acute on chronic respiratory failure with hypoxia due to fluid overload and acute on chronic diastolic CHF.  Continue CRRT.  2.  Hyperkalemia-secondary to the acute on chronic renal failure. -Patient received some Lokelma and was started on dialysis.  Improved.  Hypokalemia and hypomagnesemia.  Improved.  3.  Essential hypertension-continue carvedilol, Norvasc, Imdur.  4.  Diabetes type 2 with underlying CKD stage III-continue Lantus, sliding scale insulin. -Continue carb controlled diet.    5.  COPD-no acute exacerbation -Continue Anoro Ellipta, albuterol inhaler as needed.  6.  Hyperlipidemia-continue atorvastatin.  OSA and morbid obesity.  CPAP/BiPAP HS. All the records are reviewed and case discussed with Care Management/Social Worker. Management plans discussed with the patient, family and they are in agreement.  CODE STATUS: Full code  DVT Prophylaxis: Hep. SQ  TOTAL TIME TAKING CARE OF THIS PATIENT: 27 minutes.   POSSIBLE D/C IN ? DAYS, DEPENDING ON CLINICAL CONDITION.   Demetrios Loll M.D on 06/20/2019 at 1:10 PM  Between 7am to 6pm - Pager -  [phone]  After 6pm go to [email or web address] - password [email or web address] [facility]  Office  [phone]  CC: Primary care physician; Lavera Guise, MD

## 2019-06-21 ENCOUNTER — Inpatient Hospital Stay: Payer: Medicare Other

## 2019-06-21 LAB — RENAL FUNCTION PANEL
Albumin: 2 g/dL — ABNORMAL LOW (ref 3.5–5.0)
Albumin: 2 g/dL — ABNORMAL LOW (ref 3.5–5.0)
Albumin: 2.1 g/dL — ABNORMAL LOW (ref 3.5–5.0)
Albumin: 2.1 g/dL — ABNORMAL LOW (ref 3.5–5.0)
Anion gap: 6 (ref 5–15)
Anion gap: 5 (ref 5–15)
Anion gap: 5 (ref 5–15)
Anion gap: 9 (ref 5–15)
BUN: 15 mg/dL (ref 6–20)
BUN: 14 mg/dL (ref 6–20)
BUN: 14 mg/dL (ref 6–20)
BUN: 16 mg/dL (ref 6–20)
CO2: 27 mmol/L (ref 22–32)
CO2: 28 mmol/L (ref 22–32)
CO2: 28 mmol/L (ref 22–32)
CO2: 25 mmol/L (ref 22–32)
Calcium: 8.2 mg/dL — ABNORMAL LOW (ref 8.9–10.3)
Calcium: 8.5 mg/dL — ABNORMAL LOW (ref 8.9–10.3)
Calcium: 8.4 mg/dL — ABNORMAL LOW (ref 8.9–10.3)
Calcium: 8.4 mg/dL — ABNORMAL LOW (ref 8.9–10.3)
Chloride: 103 mmol/L (ref 98–111)
Chloride: 104 mmol/L (ref 98–111)
Chloride: 102 mmol/L (ref 98–111)
Chloride: 101 mmol/L (ref 98–111)
Creatinine, Ser: 2.43 mg/dL — ABNORMAL HIGH (ref 0.61–1.24)
Creatinine, Ser: 2.39 mg/dL — ABNORMAL HIGH (ref 0.61–1.24)
Creatinine, Ser: 2.43 mg/dL — ABNORMAL HIGH (ref 0.61–1.24)
Creatinine, Ser: 2.78 mg/dL — ABNORMAL HIGH (ref 0.61–1.24)
GFR calc Af Amer: 35 mL/min — ABNORMAL LOW (ref >60)
GFR calc Af Amer: 35 mL/min — ABNORMAL LOW (ref >60)
GFR calc Af Amer: 35 mL/min — ABNORMAL LOW (ref >60)
GFR calc Af Amer: 29 mL/min — ABNORMAL LOW (ref >60)
GFR calc non Af Amer: 30 mL/min — ABNORMAL LOW (ref >60)
GFR calc non Af Amer: 30 mL/min — ABNORMAL LOW (ref >60)
GFR calc non Af Amer: 30 mL/min — ABNORMAL LOW (ref >60)
GFR calc non Af Amer: 25 mL/min — ABNORMAL LOW (ref >60)
Glucose, Bld: 149 mg/dL — ABNORMAL HIGH (ref 70–99)
Glucose, Bld: 138 mg/dL — ABNORMAL HIGH (ref 70–99)
Glucose, Bld: 194 mg/dL — ABNORMAL HIGH (ref 70–99)
Glucose, Bld: 179 mg/dL — ABNORMAL HIGH (ref 70–99)
Phosphorus: 2.7 mg/dL (ref 2.5–4.6)
Phosphorus: 2.8 mg/dL (ref 2.5–4.6)
Phosphorus: 2.7 mg/dL (ref 2.5–4.6)
Phosphorus: 2.9 mg/dL (ref 2.5–4.6)
Potassium: 4 mmol/L (ref 3.5–5.1)
Potassium: 4.1 mmol/L (ref 3.5–5.1)
Potassium: 4.1 mmol/L (ref 3.5–5.1)
Potassium: 4.2 mmol/L (ref 3.5–5.1)
Sodium: 136 mmol/L (ref 135–145)
Sodium: 137 mmol/L (ref 135–145)
Sodium: 135 mmol/L (ref 135–145)
Sodium: 135 mmol/L (ref 135–145)

## 2019-06-21 LAB — GLUCOSE, CAPILLARY
Glucose-Capillary: 109 mg/dL — ABNORMAL HIGH (ref 70–99)
Glucose-Capillary: 170 mg/dL — ABNORMAL HIGH (ref 70–99)
Glucose-Capillary: 121 mg/dL — ABNORMAL HIGH (ref 70–99)
Glucose-Capillary: 148 mg/dL — ABNORMAL HIGH (ref 70–99)

## 2019-06-21 LAB — MAGNESIUM
Magnesium: 2 mg/dL (ref 1.7–2.4)
Magnesium: 1.8 mg/dL (ref 1.7–2.4)
Magnesium: 1.8 mg/dL (ref 1.7–2.4)

## 2019-06-21 MED ORDER — ALUM & MAG HYDROXIDE-SIMETH 200-200-20 MG/5ML PO SUSP: 30.0000 mL | Freq: Four times a day (QID) | ORAL | Status: DC | PRN

## 2019-06-21 NOTE — Progress Notes (Signed)
CRRT Medication Adjustments:   Pharmacy consulted for CRRT medication adjustments for 53 yo male requiring CRRT: no adjustments warranted at this time.   Patient transitioned to 4K bath from 2K bath  All electrolytes WNL, no replacement needed at this time. Will continue to monitor w/ q4h labs.  Will continue to monitor.   Tobie Lords, PharmD, BCPS Clinical Pharmacist 06/21/2019

## 2019-06-21 NOTE — Progress Notes (Signed)
Continues to do well at this time. Off bipap awake. Currently having a bedside ultrasound performed.

## 2019-06-21 NOTE — Progress Notes (Signed)
CRITICAL CARE NOTE      CHIEF COMPLAINT:   Acute Kidney Injury and severe hyperkalemia   Subjective    53 yo with DM, HTN, CKD admits too medication noncompliance came in with florid pulm edema and aki creat 16, severe hyperkalemia >7.  Required emergent HD     Hypoxemia resolved CKD imporved post HD Mentation has improved significantly.  Will optimize for downgrade to medical floor.    PAST MEDICAL HISTORY   Past Medical History:  Diagnosis Date  . Bell's palsy   . CHF (congestive heart failure) ([place])   . Chronic kidney disease   . Diabetes mellitus without complication ([place])   . Hypertension   . NSVT (nonsustained ventricular tachycardia) ([place])   . Obstructive sleep apnea   . Psoriasis   . Pulmonary HTN ([place])      SURGICAL HISTORY   Past Surgical History:  Procedure Laterality Date  . CORONARY ANGIOPLASTY WITH STENT PLACEMENT    . NO PAST SURGERIES       FAMILY HISTORY   Family History  Problem Relation Age of Onset  . Heart failure Mother   . Kidney failure Brother      SOCIAL HISTORY   Social History   Tobacco Use  . Smoking status: Never Smoker  . Smokeless tobacco: Never Used  Substance Use Topics  . Alcohol use: Yes    Alcohol/week: 2.0 standard drinks    Types: 1 Shots of liquor, 1 Cans of beer per week    Comment: very rarely 1-2 times per month  . Drug use: No     MEDICATIONS   Current Medication:  Current Facility-Administered Medications:  .  acetaminophen (TYLENOL) tablet 650 mg, 650 mg, Oral, Q6H PRN **OR** acetaminophen (TYLENOL) suppository 650 mg, 650 mg, Rectal, Q6H PRN, Demetrios Loll, MD .  albuterol (VENTOLIN HFA) 108 (90 Base) MCG/ACT inhaler 2 puff, 2 puff, Inhalation, Q4H PRN, Demetrios Loll, MD .  amLODipine (NORVASC) tablet 5 mg, 5 mg, Oral, Daily,  Demetrios Loll, MD, 5 mg at 06/20/19 1022 .  atorvastatin (LIPITOR) tablet 40 mg, 40 mg, Oral, QHS, Demetrios Loll, MD, 40 mg at 06/20/19 2124 .  bisacodyl (DULCOLAX) suppository 10 mg, 10 mg, Rectal, Daily PRN, Awilda Bill, NP .  carvedilol (COREG) tablet 25 mg, 25 mg, Oral, Q12H, Demetrios Loll, MD, 25 mg at 06/20/19 2125 .  chlorhexidine (PERIDEX) 0.12 % solution 15 mL, 15 mL, Mouth Rinse, BID, Lanney Gins, Fuad, MD, 15 mL at 06/19/19 2207 .  cholecalciferol (VITAMIN D3) tablet 2,000 Units, 2,000 Units, Oral, Daily, Charlett Nose, RPH, 2,000 Units at 06/20/19 1023 .  heparin injection 1,000-6,000 Units, 1,000-6,000 Units, CRRT, PRN, Awilda Bill, NP, 1,000 Units at 06/21/19 0044 .  heparin injection 5,000 Units, 5,000 Units, Subcutaneous, Q8H, Demetrios Loll, MD, 5,000 Units at 06/21/19 0177 .  hydrALAZINE (APRESOLINE) tablet 25 mg, 25 mg, Oral, Q8H, Demetrios Loll, MD, 25 mg at 06/21/19 9390 .  hydrocerin (EUCERIN) cream, , Topical, TID, Ottie Glazier, MD .  insulin aspart (novoLOG) injection 0-15 Units, 0-15 Units, Subcutaneous, TID WC, Ottie Glazier, MD, 2 Units at 06/20/19 1621 .  insulin aspart (novoLOG) injection 0-5 Units, 0-5 Units, Subcutaneous, QHS, Aleskerov, Fuad, MD .  isosorbide mononitrate (IMDUR) 24 hr tablet 60 mg, 60 mg, Oral, Daily, Demetrios Loll, MD, 60 mg at 06/20/19 1021 .  MEDLINE mouth rinse, 15 mL, Mouth Rinse, BID, Aleskerov, Fuad, MD, 15 mL at 06/19/19 2209 .  MEDLINE mouth rinse, 15 mL,  Mouth Rinse, q12n4p, Aleskerov, Fuad, MD, 15 mL at 06/19/19 1707 .  ondansetron (ZOFRAN) tablet 4 mg, 4 mg, Oral, Q6H PRN **OR** ondansetron (ZOFRAN) injection 4 mg, 4 mg, Intravenous, Q6H PRN, Demetrios Loll, MD, 4 mg at 06/17/19 1547 .  oxyCODONE-acetaminophen (PERCOCET/ROXICET) 5-325 MG per tablet 1-2 tablet, 1-2 tablet, Oral, Q6H PRN, Awilda Bill, NP, 2 tablet at 06/21/19 0223 .  pureflow IV solution for Dialysis, , CRRT, Continuous, Lateef, Munsoor, MD, Last Rate: 2,000 mL/hr at 06/21/19  0100 .  senna-docusate (Senokot-S) tablet 1 tablet, 1 tablet, Oral, QHS PRN, Demetrios Loll, MD .  sodium chloride flush (NS) 0.9 % injection 10 mL, 10 mL, Intracatheter, Q12H, Aleskerov, Fuad, MD .  sodium chloride flush (NS) 0.9 % injection 10-40 mL, 10-40 mL, Intracatheter, PRN, Ottie Glazier, MD .  traZODone (DESYREL) tablet 25-50 mg, 25-50 mg, Oral, QHS PRN, Demetrios Loll, MD .  umeclidinium-vilanterol (ANORO ELLIPTA) 62.5-25 MCG/INH 1 puff, 1 puff, Inhalation, Daily, Demetrios Loll, MD, 1 puff at 06/20/19 1029    ALLERGIES   Shellfish allergy    REVIEW OF SYSTEMS    10 point ROS conducted is negative except for hunger  PHYSICAL EXAMINATION   Vitals:   06/21/19 0600 06/21/19 0639  BP:  (!) 135/91  Pulse: 82 90  Resp: 15 17  Temp:    SpO2: 92% 97%    GENERAL: Mild distress due to shortness of breath and lower extremity pain HEAD: Normocephalic, atraumatic.  EYES: Pupils equal, round, reactive to light.  No scleral icterus.  MOUTH: Moist mucosal membrane. NECK: Supple. No thyromegaly. No nodules. No JVD.  PULMONARY: Decreased breath sounds bilaterally CARDIOVASCULAR: S1 and S2. Regular rate and rhythm. No murmurs, rubs, or gallops.  GASTROINTESTINAL: Soft, nontender, non-distended. No masses. Positive bowel sounds. No hepatosplenomegaly.  MUSCULOSKELETAL: No swelling, clubbing, or 3+ edema NEUROLOGIC: Mild distress due to acute illness SKIN:intact,warm,dry   LABS AND IMAGING     LAB RESULTS: Recent Labs  Lab 06/20/19 1812 06/20/19 2325 06/21/19 0211  NA 135 135 136  K 3.8 3.9 4.0  CL 101 102 103  CO2 27 27 27   BUN 15 15 15   CREATININE 2.54* 2.36* 2.43*  GLUCOSE 177* 154* 149*   Recent Labs  Lab 06/18/19 0420 06/19/19 0558 06/20/19 0549  HGB 9.5* 8.8* 8.5*  HCT 31.9* 28.8* 28.2*  WBC 14.4* 9.3 8.2  PLT 200 156 137*     IMAGING RESULTS: US Venous Img Upper Uni Right  Result Date: 06/21/2019 CLINICAL DATA:  Right arm swelling. EXAM: RIGHT UPPER  EXTREMITY VENOUS DOPPLER ULTRASOUND TECHNIQUE: Gray-scale sonography with graded compression, as well as color Doppler and duplex ultrasound were performed to evaluate the upper extremity deep venous system from the level of the subclavian vein and including the jugular, axillary, basilic, radial, ulnar and upper cephalic vein. Spectral Doppler was utilized to evaluate flow at rest and with distal augmentation maneuvers. COMPARISON:  None. FINDINGS: Contralateral Subclavian Vein: Respiratory phasicity is normal and symmetric with the symptomatic side. No evidence of thrombus. Normal compressibility. Internal Jugular Vein: No evidence of thrombus. Normal compressibility, respiratory phasicity and response to augmentation. Subclavian Vein: No evidence of thrombus. Normal compressibility, respiratory phasicity and response to augmentation. Axillary Vein: No evidence of thrombus. Normal compressibility, respiratory phasicity and response to augmentation. Cephalic Vein: No evidence of thrombus. Normal compressibility, respiratory phasicity and response to augmentation. Basilic Vein: No evidence of thrombus. Normal compressibility, respiratory phasicity and response to augmentation. Brachial Veins: No evidence of thrombus. Normal compressibility,  respiratory phasicity and response to augmentation. Radial Veins: No evidence of thrombus. Normal compressibility, respiratory phasicity and response to augmentation. Ulnar Veins: No evidence of thrombus. Normal compressibility, respiratory phasicity and response to augmentation. Venous Reflux:  None visualized. Other Findings:  None visualized. IMPRESSION: No evidence of DVT within the right upper extremity. Electronically Signed   By: Titus Dubin M.D.   On: 06/21/2019 05:25      ASSESSMENT AND PLAN    -Multidisciplinary rounds held today  Acute Hypoxic Respiratory Failure Resolved now  -Likely due to pulmonary edema from fluid overload secondary to AKI -s/p  dialysis treatment via temp trialysis catheter -INR and PTT pending -needs bipap at home will discuss with [employer] for eval  Severe hyperkalemia -now resolved post emergent HD -Status post EKG -Duo nebs, Lasix calcium gluconate -Plan for emergent HD    Renal Failure-Acute on chronic renal failure stage IV -Likely due to hypertensive and diabetic nephropathy with noncompliance -Nephrology on case-appreciate input following recommendations -Emergent HD -follow chem 7 -follow UO -continue Foley Catheter-assess need daily    Obstructive sleep apnea -May provide CPAP with home settings as per RT   GI/Nutrition GI PROPHYLAXIS as indicated DIET-->TF's as tolerated Constipation protocol as indicated  ENDO - ICU hypoglycemic\Hyperglycemia protocol -check FSBS per protocol   ELECTROLYTES -follow labs as needed -replace as needed -pharmacy consultation   DVT/GI PRX ordered -SCDs  TRANSFUSIONS AS NEEDED MONITOR FSBS ASSESS the need for LABS as needed   Critical care provider statement:    Critical care time (minutes):  31   Critical care time was exclusive of:  Separately billable procedures and treating other patients   Critical care was necessary to treat or prevent imminent or life-threatening deterioration of the following conditions:   Severe hyperkalemia, acute on chronic renal insufficiency stage IV, morbid obesity, pulmonary hypertension, obstructive sleep apnea   Critical care was time spent personally by me on the following activities:  Development of treatment plan with patient or surrogate, discussions with consultants, evaluation of patient's response to treatment, examination of patient, obtaining history from patient or surrogate, ordering and performing treatments and interventions, ordering and review of laboratory studies and re-evaluation of patient's condition.  I assumed direction of critical care for this patient from another provider in my  specialty: no    This document was prepared using Dragon voice recognition software and may include unintentional dictation errors.    Ottie Glazier, M.D.  Division of [facility]

## 2019-06-21 NOTE — Progress Notes (Signed)
[facility] [place] NAME: [name]    MR#:  [id number]  DATE OF BIRTH:  [birth year]  SUBJECTIVE:   The patient has no complaints, he feels much better, ON oxygen by nasal cannula 3 L.  Still oliguria, on CRRT. REVIEW OF SYSTEMS:    Review of Systems  Constitutional: Positive for malaise/fatigue. Negative for chills and fever.  HENT: Negative for sore throat.   Eyes: Negative for blurred vision and double vision.  Respiratory: Negative for cough, hemoptysis, shortness of breath, wheezing and stridor.   Cardiovascular: Negative for chest pain, palpitations, orthopnea and leg swelling.  Gastrointestinal: Negative for abdominal pain, blood in stool, diarrhea, melena, nausea and vomiting.  Genitourinary: Negative for dysuria, flank pain and hematuria.  Musculoskeletal: Negative for back pain and joint pain.  Neurological: Negative for dizziness, sensory change, focal weakness, seizures, loss of consciousness, weakness and headaches.  Endo/Heme/Allergies: Negative for polydipsia.  Psychiatric/Behavioral: Negative for depression. The patient is not nervous/anxious.     Nutrition: Renal/Carb modified Tolerating Diet: yes Tolerating PT: Await Eval.   DRUG ALLERGIES:   Allergies  Allergen Reactions   Shellfish Allergy Anaphylaxis    Face and throat swelling, difficulty breathing Allergy can be triggered by touching (contact)    VITALS:  Blood pressure 107/70, pulse 86, temperature 97.8 F (36.6 C), temperature source Oral, resp. rate 16, height 5\' 3"  (1.6 m), weight 128 kg, SpO2 97 %.  PHYSICAL EXAMINATION:   Physical Exam  GENERAL:  53 y.o.-year-old obese patient lying in bed in no acute distress.  Morbid obesity. EYES: Pupils equal, round, reactive to light and accommodation. No scleral icterus. Extraocular muscles intact.  HEENT: Head atraumatic, normocephalic. Oropharynx and nasopharynx clear.  NECK:  Supple, no jugular  venous distention. No thyroid enlargement, no tenderness.  LUNGS: Poor Resp. effort, no wheezing, rales, rhonchi. No use of accessory muscles of respiration.  CARDIOVASCULAR: S1, S2 normal. No murmurs, rubs, or gallops.  ABDOMEN: Soft, nontender, nondistended. Bowel sounds present. No organomegaly or mass.  EXTREMITIES: No cyanosis, clubbing, + 1 edema b/l. Signs of chronic venous stasis b/l.     NEUROLOGIC: Cranial nerves II through XII are intact. No focal Motor or sensory deficits b/l.  Globally weak.   PSYCHIATRIC: The patient is alert and oriented x 3.  SKIN: No obvious rash, lesion, or ulcer.   Left groin dialysis catheter in place   LABORATORY PANEL:   CBC Recent Labs  Lab 06/20/19 0549  WBC 8.2  HGB 8.5*  HCT 28.2*  PLT 137*   ------------------------------------------------------------------------------------------------------------------  Chemistries  Recent Labs  Lab 06/15/19 1348  06/21/19 0947  NA 131*   < > 135  K 7.2*   < > 4.1  CL 97*   < > 102  CO2 18*   < > 28  GLUCOSE 262*   < > 194*  BUN 109*   < > 14  CREATININE 16.23*   < > 2.43*  CALCIUM 7.1*   < > 8.4*  MG  --    < > 1.8  AST 12*  --   --   ALT 11  --   --   ALKPHOS 140*  --   --   BILITOT 0.6  --   --    < > = values in this interval not displayed.   ------------------------------------------------------------------------------------------------------------------  Cardiac Enzymes No results for input(s): TROPONINI in the last 168 hours. ------------------------------------------------------------------------------------------------------------------  RADIOLOGY:  Dg Shoulder 1v  Right  Result Date: 06/21/2019 CLINICAL DATA:  Pain and swelling EXAM: RIGHT SHOULDER - 1 VIEW COMPARISON:  None. FINDINGS: There is no evidence of fracture or dislocation. There is no evidence of arthropathy or other focal bone abnormality. Soft tissues are unremarkable. IMPRESSION: Negative. Electronically Signed    By: Nelson Chimes M.D.   On: 06/21/2019 06:59   US Venous Img Upper Uni Right  Result Date: 06/21/2019 CLINICAL DATA:  Right arm swelling. EXAM: RIGHT UPPER EXTREMITY VENOUS DOPPLER ULTRASOUND TECHNIQUE: Gray-scale sonography with graded compression, as well as color Doppler and duplex ultrasound were performed to evaluate the upper extremity deep venous system from the level of the subclavian vein and including the jugular, axillary, basilic, radial, ulnar and upper cephalic vein. Spectral Doppler was utilized to evaluate flow at rest and with distal augmentation maneuvers. COMPARISON:  None. FINDINGS: Contralateral Subclavian Vein: Respiratory phasicity is normal and symmetric with the symptomatic side. No evidence of thrombus. Normal compressibility. Internal Jugular Vein: No evidence of thrombus. Normal compressibility, respiratory phasicity and response to augmentation. Subclavian Vein: No evidence of thrombus. Normal compressibility, respiratory phasicity and response to augmentation. Axillary Vein: No evidence of thrombus. Normal compressibility, respiratory phasicity and response to augmentation. Cephalic Vein: No evidence of thrombus. Normal compressibility, respiratory phasicity and response to augmentation. Basilic Vein: No evidence of thrombus. Normal compressibility, respiratory phasicity and response to augmentation. Brachial Veins: No evidence of thrombus. Normal compressibility, respiratory phasicity and response to augmentation. Radial Veins: No evidence of thrombus. Normal compressibility, respiratory phasicity and response to augmentation. Ulnar Veins: No evidence of thrombus. Normal compressibility, respiratory phasicity and response to augmentation. Venous Reflux:  None visualized. Other Findings:  None visualized. IMPRESSION: No evidence of DVT within the right upper extremity. Electronically Signed   By: Titus Dubin M.D.   On: 06/21/2019 05:25     ASSESSMENT AND PLAN:   53 year old  male with past medical history of hypertension, diabetes, CKD stage III, history of chronic diastolic CHF, obstructive sleep apnea, psoriasis, pulmonary hypertension who presented to the hospital due to acute on chronic renal failure with severe hyperkalemia.  1.  Acute on chronic renal failure- secondary to dehydration, diabetes and underlying CKD. - Patient's baseline creatinines around 1.4 in May of this past year and patient presented to the hospital the creatinine greater than 10. - Remains oliguric/anuric and therefore started on CRRT. - Renal ultrasound was negative for hydronephrosis.  Serologic work-up has been initiated by nephrology. - Continue CRRT and Consider taking off early next week per Dr. Holley Raring.  Acute on chronic respiratory failure with hypoxia due to fluid overload and acute on chronic diastolic CHF.  Continue CRRT.  2.  Hyperkalemia-secondary to the acute on chronic renal failure. -Patient received some Lokelma and was started on dialysis.  Improved.  Hypokalemia and hypomagnesemia.  Improved.  3.  Essential hypertension-continue carvedilol, Norvasc, Imdur.  4.  Diabetes type 2 with underlying CKD stage III-continue Lantus, sliding scale insulin. -Continue carb controlled diet.    5.  COPD-no acute exacerbation -Continue Anoro Ellipta, albuterol inhaler as needed.  6.  Hyperlipidemia-continue atorvastatin.  OSA and morbid obesity.  CPAP/BIPAP HS. All the records are reviewed and case discussed with Care Management/Social Worker. Management plans discussed with the patient, family and they are in agreement.  CODE STATUS: Full code  DVT Prophylaxis: Hep. SQ  TOTAL TIME TAKING CARE OF THIS PATIENT: 27 minutes.   POSSIBLE D/C IN ? DAYS, DEPENDING ON CLINICAL CONDITION.   Demetrios Loll M.D on  06/21/2019 at 10:37 AM  Between 7am to 6pm - Pager - [phone]  After 6pm go to [email or web address] - password [email or web address] [facility]  Office   [phone]  CC: Primary care physician; Lavera Guise, MD

## 2019-06-21 NOTE — Progress Notes (Signed)
CRRT Medication Adjustments:   Pharmacy consulted for CRRT medication adjustments for 53 yo male requiring CRRT: no adjustments warranted at this time.   Patient transitioned to 4K bath from 2K bath  All electrolytes WNL, no replacement needed at this time. Will continue to monitor.  Will continue to monitor.   Tobie Lords, PharmD, BCPS Clinical Pharmacist 06/21/2019

## 2019-06-21 NOTE — Progress Notes (Signed)
[facility]  ROUNDING NOTE   Subjective:  Patient taken off of CRRT this a.m. Ultrafiltration achieved over the preceding 24 hours was 1.5 L. Patient not producing any urine. Discussed with patient that he may need prolonged outpatient dialysis. He states that his brother is on dialysis at a local unit as well.   Objective:  Vital signs in last 24 hours:  Temp:  [97.7 F (36.5 C)-98.3 F (36.8 C)] 97.8 F (36.6 C) (07/05 0800) Pulse Rate:  [73-90] 77 (07/05 1300) Resp:  [11-25] 14 (07/05 1300) BP: (103-137)/(66-92) 127/78 (07/05 1300) SpO2:  [92 %-99 %] 97 % (07/05 1000) Weight:  [518 kg] 128 kg (07/05 0214)  Weight change: -0.6 kg Filed Weights   06/19/19 0437 06/20/19 0244 06/21/19 0214  Weight: 128.1 kg 128.6 kg 128 kg    Intake/Output: I/O last 3 completed shifts: In: 745.6 [P.O.:580; IV Piggyback:165.6] Out: 2345 [Other:2345]   Intake/Output this shift:  Total I/O In: 240 [P.O.:240] Out: 167 [Other:167]  Physical Exam: General: No acute distress  Head: Normocephalic, atraumatic. Moist oral mucosal membranes  Eyes: Anicteric  Neck: Supple, trachea midline  Lungs:  Scattered rhonchi, breathing comfortably.  Heart: S1S2 no rubs  Abdomen:  Soft, nontender, bowel sounds present  Extremities: 1+ peripheral edema.  Neurologic: Awake, alert, conversant  Skin: No lesions  Access: Left femoral vein dialysis catheter    Basic Metabolic Panel: Recent Labs  Lab 06/20/19 1812 06/20/19 2325 06/21/19 0211 06/21/19 0629 06/21/19 0947 06/21/19 1130  NA 135 135 136 137 135 135  K 3.8 3.9 4.0 4.1 4.1 4.2  CL 101 102 103 104 102 101  CO2 _0 GLUCOSE 177* 154* 149* 138* 194* 179*  BUN _1 CREATININE 2.54* 2.36* 2.43* 2.39* 2.43* 2.78*  CALCIUM 8.4* 8.3* 8.2* 8.5* 8.4* 8.4*  MG 1.9 2.0 2.0 1.8 1.8  --   PHOS 2.2* 2.7 2.7 2.8 2.7 2.9    Liver Function Tests: Recent Labs  Lab 06/15/19 1348  06/20/19 2325  06/21/19 0211 06/21/19 0629 06/21/19 0947 06/21/19 1130  AST 12*  --   --   --   --   --   --   ALT 11  --   --   --   --   --   --   ALKPHOS 140*  --   --   --   --   --   --   BILITOT 0.6  --   --   --   --   --   --   PROT 6.7  --   --   --   --   --   --   ALBUMIN 2.4*   < > 2.0* 2.0* 2.0* 2.1* 2.1*   < > = values in this interval not displayed.   No results for input(s): LIPASE, AMYLASE in the last 168 hours. No results for input(s): AMMONIA in the last 168 hours.  CBC: Recent Labs  Lab 06/15/19 1348 06/16/19 0223 06/17/19 0526 06/18/19 0420 06/19/19 0558 06/20/19 0549  WBC 12.8* 12.8* 11.8* 14.4* 9.3 8.2  NEUTROABS 9.8*  --   --   --  6.6 5.2  HGB 9.8* 9.1* 9.6* 9.5* 8.8* 8.5*  HCT 32.7* 30.0* 32.0* 31.9* 28.8* 28.2*  MCV 78.4* 77.3* 78.8* 79.0* 78.5* 79.4*  PLT 236 233 201 200 156 137*    Cardiac Enzymes: No results for input(s): CKTOTAL, CKMB, CKMBINDEX, TROPONINI in  the last 168 hours.  BNP: Invalid input(s): POCBNP  CBG: Recent Labs  Lab 06/20/19 1150 06/20/19 1604 06/20/19 2123 06/21/19 0730 06/21/19 1129  GLUCAP 157* 140* 164* 109* 170*    Microbiology: Results for orders placed or performed during the hospital encounter of 06/15/19  Blood culture (routine x 2)     Status: None   Collection Time: 06/15/19  1:48 PM   Specimen: BLOOD  Result Value Ref Range Status   Specimen Description BLOOD BLOOD LEFT ARM  Final   Special Requests   Final    BOTTLES DRAWN AEROBIC AND ANAEROBIC Blood Culture adequate volume   Culture   Final    NO GROWTH 5 DAYS Performed at [facility], [address], [place], [place] [address]    Report Status 06/20/2019 FINAL  Final  SARS Coronavirus 2 (CEPHEID- Performed in [place] hospital lab), Hosp Order     Status: None   Collection Time: 06/15/19  1:57 PM   Specimen: Nasopharyngeal Swab  Result Value Ref Range Status   SARS Coronavirus 2 NEGATIVE NEGATIVE Final    Comment: (NOTE) If result is  NEGATIVE SARS-CoV-2 target nucleic acids are NOT DETECTED. The SARS-CoV-2 RNA is generally detectable in upper and lower  respiratory specimens during the acute phase of infection. The lowest  concentration of SARS-CoV-2 viral copies this assay can detect is 250  copies / mL. A negative result does not preclude SARS-CoV-2 infection  and should not be used as the sole basis for treatment or other  patient management decisions.  A negative result may occur with  improper specimen collection / handling, submission of specimen other  than nasopharyngeal swab, presence of viral mutation(s) within the  areas targeted by this assay, and inadequate number of viral copies  (<250 copies / mL). A negative result must be combined with clinical  observations, patient history, and epidemiological information. If result is POSITIVE SARS-CoV-2 target nucleic acids are DETECTED. The SARS-CoV-2 RNA is generally detectable in upper and lower  respiratory specimens dur ing the acute phase of infection.  Positive  results are indicative of active infection with SARS-CoV-2.  Clinical  correlation with patient history and other diagnostic information is  necessary to determine patient infection status.  Positive results do  not rule out bacterial infection or co-infection with other viruses. If result is PRESUMPTIVE POSTIVE SARS-CoV-2 nucleic acids MAY BE PRESENT.   A presumptive positive result was obtained on the submitted specimen  and confirmed on repeat testing.  While 2019 novel coronavirus  (SARS-CoV-2) nucleic acids may be present in the submitted sample  additional confirmatory testing may be necessary for epidemiological  and / or clinical management purposes  to differentiate between  SARS-CoV-2 and other Sarbecovirus currently known to infect humans.  If clinically indicated additional testing with an alternate test  methodology [phone]) is advised. The SARS-CoV-2 RNA is generally  detectable  in upper and lower respiratory sp ecimens during the acute  phase of infection. The expected result is Negative. Fact Sheet for Patients:  StrictlyIdeas.no Fact Sheet for Healthcare Providers: BankingDealers.co.za This test is not yet approved or cleared by the [place] FDA and has been authorized for detection and/or diagnosis of SARS-CoV-2 by FDA under an Emergency Use Authorization (EUA).  This EUA will remain in effect (meaning this test can be used) for the duration of the COVID-19 declaration under Section 564(b)(1) of the Act, 21 U.S.C. section 360bbb-3(b)(1), unless the authorization is terminated or revoked sooner. Performed  at [facility], [address] [place], [place] [address]   Blood culture (routine x 2)     Status: None   Collection Time: 06/15/19  4:40 PM   Specimen: BLOOD  Result Value Ref Range Status   Specimen Description BLOOD BLOOD RIGHT ARM  Final   Special Requests   Final    BOTTLES DRAWN AEROBIC AND ANAEROBIC Blood Culture adequate volume   Culture   Final    NO GROWTH 5 DAYS Performed at [facility], [address], [place], [place] [address]    Report Status 06/20/2019 FINAL  Final    Coagulation Studies: No results for input(s): LABPROT, INR in the last 72 hours.  Urinalysis: No results for input(s): COLORURINE, LABSPEC, PHURINE, GLUCOSEU, HGBUR, BILIRUBINUR, KETONESUR, PROTEINUR, UROBILINOGEN, NITRITE, LEUKOCYTESUR in the last 72 hours.  Invalid input(s): APPERANCEUR    Imaging: Dg Shoulder 1v Right  Result Date: 06/21/2019 CLINICAL DATA:  Pain and swelling EXAM: RIGHT SHOULDER - 1 VIEW COMPARISON:  None. FINDINGS: There is no evidence of fracture or dislocation. There is no evidence of arthropathy or other focal bone abnormality. Soft tissues are unremarkable. IMPRESSION: Negative. Electronically Signed   By: Nelson Chimes M.D.   On: 06/21/2019 06:59   US Venous Img  Upper Uni Right  Result Date: 06/21/2019 CLINICAL DATA:  Right arm swelling. EXAM: RIGHT UPPER EXTREMITY VENOUS DOPPLER ULTRASOUND TECHNIQUE: Gray-scale sonography with graded compression, as well as color Doppler and duplex ultrasound were performed to evaluate the upper extremity deep venous system from the level of the subclavian vein and including the jugular, axillary, basilic, radial, ulnar and upper cephalic vein. Spectral Doppler was utilized to evaluate flow at rest and with distal augmentation maneuvers. COMPARISON:  None. FINDINGS: Contralateral Subclavian Vein: Respiratory phasicity is normal and symmetric with the symptomatic side. No evidence of thrombus. Normal compressibility. Internal Jugular Vein: No evidence of thrombus. Normal compressibility, respiratory phasicity and response to augmentation. Subclavian Vein: No evidence of thrombus. Normal compressibility, respiratory phasicity and response to augmentation. Axillary Vein: No evidence of thrombus. Normal compressibility, respiratory phasicity and response to augmentation. Cephalic Vein: No evidence of thrombus. Normal compressibility, respiratory phasicity and response to augmentation. Basilic Vein: No evidence of thrombus. Normal compressibility, respiratory phasicity and response to augmentation. Brachial Veins: No evidence of thrombus. Normal compressibility, respiratory phasicity and response to augmentation. Radial Veins: No evidence of thrombus. Normal compressibility, respiratory phasicity and response to augmentation. Ulnar Veins: No evidence of thrombus. Normal compressibility, respiratory phasicity and response to augmentation. Venous Reflux:  None visualized. Other Findings:  None visualized. IMPRESSION: No evidence of DVT within the right upper extremity. Electronically Signed   By: Titus Dubin M.D.   On: 06/21/2019 05:25     Medications:    . amLODipine  5 mg Oral Daily  . atorvastatin  40 mg Oral QHS  . carvedilol   25 mg Oral Q12H  . chlorhexidine  15 mL Mouth Rinse BID  . cholecalciferol  2,000 Units Oral Daily  . heparin  5,000 Units Subcutaneous Q8H  . hydrALAZINE  25 mg Oral Q8H  . hydrocerin   Topical TID  . insulin aspart  0-15 Units Subcutaneous TID WC  . insulin aspart  0-5 Units Subcutaneous QHS  . isosorbide mononitrate  60 mg Oral Daily  . mouth rinse  15 mL Mouth Rinse BID  . mouth rinse  15 mL Mouth Rinse q12n4p  . sodium chloride flush  10 mL Intracatheter Q12H  . umeclidinium-vilanterol  1 puff Inhalation Daily   acetaminophen **OR** acetaminophen, albuterol, bisacodyl, ondansetron **OR** ondansetron (ZOFRAN) IV, oxyCODONE-acetaminophen, senna-docusate, sodium chloride flush, traZODone  Assessment/ Plan:  53 y.o. male with a PMHx of Bell's palsy, congestive heart failure, diabetes mellitus type 2, hypertension, obstructive sleep apnea, psoriasis, pulmonary hypertension, chronic kidney disease stage II, who was admitted to [facility] on 06/15/2019 for evaluation of severe acute renal failure in the setting of generalized weakness.   1.  Acute renal failure/chronic kidney disease stage II Baseline EGFR 64 with suspected dehydration/diabetes mellitus type 2 with chronic kidney disease. -It appears that the patient remains anuric at this time.  We have stopped CRRT.  We will transition him to hemodialysis starting tomorrow.  In addition we will consult with vascular surgery for PermCath placement.  Patient states that his brother dialyzes at [facility].  He would like to go to this unit if he needs prolonged dialysis.  Suspect that he will need dialysis as an outpatient as well.  2.  Acute respiratory failure.  Patient transitioned off of BiPAP as well.  Breathing comfortably.  3.  Hypertension.  Maintain the patient on amlodipine, carvedilol, hydralazine, isosorbide mononitrate.   LOS: 6 [name] 7/5/20202:04 PM

## 2019-06-21 NOTE — Progress Notes (Signed)
CRRT stopped at this time.

## 2019-06-21 NOTE — Progress Notes (Signed)
Patient remains awake watching tv at this time. No distress noted. bipap on sb

## 2019-06-22 ENCOUNTER — Encounter: Payer: Self-pay | Admitting: *Deleted

## 2019-06-22 ENCOUNTER — Ambulatory Visit: Payer: Medicare Other | Admitting: Internal Medicine

## 2019-06-22 ENCOUNTER — Inpatient Hospital Stay
Admission: EM | Disposition: A | Discharge status: Home / Self Care | Payer: Self-pay | Source: Home / Self Care | Attending: Internal Medicine

## 2019-06-22 DIAGNOSIS — N189 Chronic kidney disease, unspecified: Secondary | ICD-10-CM

## 2019-06-22 DIAGNOSIS — E1122 Type 2 diabetes mellitus with diabetic chronic kidney disease: Secondary | ICD-10-CM

## 2019-06-22 DIAGNOSIS — I129 Hypertensive chronic kidney disease with stage 1 through stage 4 chronic kidney disease, or unspecified chronic kidney disease: Secondary | ICD-10-CM

## 2019-06-22 DIAGNOSIS — N186 End stage renal disease: Secondary | ICD-10-CM

## 2019-06-22 DIAGNOSIS — N179 Acute kidney failure, unspecified: Principal | ICD-10-CM

## 2019-06-22 HISTORY — PX: DIALYSIS/PERMA CATHETER INSERTION: CATH118288

## 2019-06-22 LAB — GLUCOSE, CAPILLARY
Glucose-Capillary: 143 mg/dL — ABNORMAL HIGH (ref 70–99)
Glucose-Capillary: 122 mg/dL — ABNORMAL HIGH (ref 70–99)
Glucose-Capillary: 130 mg/dL — ABNORMAL HIGH (ref 70–99)
Glucose-Capillary: 131 mg/dL — ABNORMAL HIGH (ref 70–99)
Glucose-Capillary: 172 mg/dL — ABNORMAL HIGH (ref 70–99)

## 2019-06-22 LAB — MAGNESIUM: Magnesium: 1.9 mg/dL (ref 1.7–2.4)

## 2019-06-22 LAB — BASIC METABOLIC PANEL
Anion gap: 7 (ref 5–15)
BUN: 25 mg/dL — ABNORMAL HIGH (ref 6–20)
CO2: 27 mmol/L (ref 22–32)
Calcium: 8.6 mg/dL — ABNORMAL LOW (ref 8.9–10.3)
Chloride: 103 mmol/L (ref 98–111)
Creatinine, Ser: 4.54 mg/dL — ABNORMAL HIGH (ref 0.61–1.24)
GFR calc Af Amer: 16 mL/min — ABNORMAL LOW (ref >60)
GFR calc non Af Amer: 14 mL/min — ABNORMAL LOW (ref >60)
Glucose, Bld: 140 mg/dL — ABNORMAL HIGH (ref 70–99)
Potassium: 4.3 mmol/L (ref 3.5–5.1)
Sodium: 137 mmol/L (ref 135–145)

## 2019-06-22 LAB — BLOOD GAS, ARTERIAL
Acid-base deficit: 0 mmol/L (ref 0.0–2.0)
Bicarbonate: 27.2 mmol/L (ref 20.0–28.0)
FIO2: 0.32
O2 Saturation: 88.5 %
Patient temperature: 37
pCO2 arterial: 54 mmHg — ABNORMAL HIGH (ref 32.0–48.0)
pH, Arterial: 7.31 — ABNORMAL LOW (ref 7.350–7.450)
pO2, Arterial: 61 mmHg — ABNORMAL LOW (ref 83.0–108.0)

## 2019-06-22 LAB — PHOSPHORUS: Phosphorus: 3.8 mg/dL (ref 2.5–4.6)

## 2019-06-22 LAB — ALBUMIN: Albumin: 2.1 g/dL — ABNORMAL LOW (ref 3.5–5.0)

## 2019-06-22 LAB — MRSA PCR SCREENING: MRSA by PCR: NEGATIVE

## 2019-06-22 SURGERY — DIALYSIS/PERMA CATHETER INSERTION
Anesthesia: Moderate Sedation

## 2019-06-22 MED ORDER — EPOETIN ALFA 10000 UNIT/ML IJ SOLN
10000.0000 [IU] | Freq: Once | INTRAMUSCULAR | Status: AC
  Administered 2019-06-22: 10000 [IU] via INTRAVENOUS

## 2019-06-22 MED ORDER — FENTANYL CITRATE (PF) 100 MCG/2ML IJ SOLN
INTRAMUSCULAR | Status: DC | PRN
  Administered 2019-06-22: 50 ug via INTRAVENOUS
  Administered 2019-06-22: 25 ug via INTRAVENOUS

## 2019-06-22 MED ORDER — CHLORHEXIDINE GLUCONATE 0.12 % MT SOLN
15.0000 mL | Freq: Two times a day (BID) | OROMUCOSAL | Status: DC
  Administered 2019-06-22 – 2019-06-25 (×6): 15 mL via OROMUCOSAL
  Filled 2019-06-22 (×5): qty 15

## 2019-06-22 MED ORDER — EPOETIN ALFA 10000 UNIT/ML IJ SOLN
10000.0000 [IU] | INTRAMUSCULAR | Status: DC
  Administered 2019-06-23: 10000 [IU] via INTRAVENOUS

## 2019-06-22 MED ORDER — MIDAZOLAM HCL 5 MG/5ML IJ SOLN
INTRAMUSCULAR | Status: AC
  Filled 2019-06-22: qty 5

## 2019-06-22 MED ORDER — CEFAZOLIN (ANCEF) 1 G IV SOLR
1.0000 g | INTRAVENOUS | Status: DC
  Filled 2019-06-22: qty 1

## 2019-06-22 MED ORDER — MIDAZOLAM HCL 2 MG/2ML IJ SOLN
INTRAMUSCULAR | Status: DC | PRN
  Administered 2019-06-22: 2 mg via INTRAVENOUS
  Administered 2019-06-22: 1 mg via INTRAVENOUS

## 2019-06-22 MED ORDER — CEFAZOLIN SODIUM-DEXTROSE 1-4 GM/50ML-% IV SOLN
1.0000 g | INTRAVENOUS | Status: DC
  Filled 2019-06-22: qty 50

## 2019-06-22 MED ORDER — ORAL CARE MOUTH RINSE: 15.0000 mL | Freq: Two times a day (BID) | OROMUCOSAL | Status: DC

## 2019-06-22 MED ORDER — FENTANYL CITRATE (PF) 100 MCG/2ML IJ SOLN
INTRAMUSCULAR | Status: AC
  Filled 2019-06-22: qty 2

## 2019-06-22 MED ORDER — HEPARIN SODIUM (PORCINE) 10000 UNIT/ML IJ SOLN
INTRAMUSCULAR | Status: AC
  Filled 2019-06-22: qty 1

## 2019-06-22 SURGICAL SUPPLY — 6 items
CATH CANNON HEMO 15FR 19 (HEMODIALYSIS SUPPLIES) ×2 IMPLANT
DERMABOND ADVANCED (GAUZE/BANDAGES/DRESSINGS) ×1
DERMABOND ADVANCED .7 DNX12 (GAUZE/BANDAGES/DRESSINGS) ×1 IMPLANT
PACK ANGIOGRAPHY (CUSTOM PROCEDURE TRAY) ×2 IMPLANT
SUT MNCRL AB 4-0 PS2 18 (SUTURE) ×2 IMPLANT
SUT PROLENE 0 CT 1 30 (SUTURE) ×2 IMPLANT

## 2019-06-22 NOTE — Progress Notes (Signed)
FEV1 is .85 L, 33% of predicted FVC is .95 L 29.7% of predicted Pt gave fair effort.

## 2019-06-22 NOTE — Progress Notes (Signed)
HD Tx Start Note:   06/22/19 1545  Vital Signs  Resp 17  BP 131/80  Oxygen Therapy  SpO2 97 %  O2 Device Nasal Cannula  During Hemodialysis Assessment  Blood Flow Rate (mL/min) 300 mL/min  Arterial Pressure (mmHg) -120 mmHg  Venous Pressure (mmHg) 90 mmHg  Transmembrane Pressure (mmHg) 50 mmHg  Ultrafiltration Rate (mL/min) 990 mL/min  Dialysate Flow Rate (mL/min) 600 ml/min  Conductivity: Machine  15.3  HD Safety Checks Performed Yes  Intra-Hemodialysis Comments Tx initiated

## 2019-06-22 NOTE — Consult Note (Signed)
Pharmacy Antibiotic Note  [name] is a 53 y.o. male admitted on 06/15/2019 with hyperkalemia.  Pharmacy has been consulted for cefazolin dosing for a new left leg cellulitis. He remains anuric following CRRT and is now being transitioned to HD following PermCath placement today  Plan: Start cefazolin 1 gram IV every 24 hours. I will time the doses for the evenings so it is given after HD. First dose was administered in the OR at approximately 11:00 am this morning (not documented but verbally confirmed)  Height: 5\' 3"  (160 cm) Weight: 280 lb 3.3 oz (127.1 kg) IBW/kg (Calculated) : 56.9  Temp (24hrs), Avg:98.2 F (36.8 C), Min:98 F (36.7 C), Max:98.6 F (37 C)  Recent Labs  Lab 06/15/19 1348  06/16/19 0223  06/17/19 0526  06/18/19 0420  06/19/19 0558  06/20/19 0549  06/21/19 0211 06/21/19 0629 06/21/19 0947 06/21/19 1130 06/22/19 0440  WBC 12.8*  --  12.8*  --  11.8*  --  14.4*  --  9.3  --  8.2  --   --   --   --   --   --   CREATININE 16.23*   < >  --    < > 7.41*   < >  --    < > 3.18*   < > 2.67*   < > 2.43* 2.39* 2.43* 2.78* 4.54*  LATICACIDVEN 0.6  --   --   --   --   --   --   --   --   --   --   --   --   --   --   --   --    < > = values in this interval not displayed.    Estimated Creatinine Clearance: 23.4 mL/min (A) (by C-G formula based on SCr of 4.54 mg/dL (H)).    Antimicrobials this admission: cefazolin 7/6 >>   Microbiology results: 6/29 BCx: NGF 6/29 SARS CoV-2: negative  7/6 MRSA PCR: negative  Thank you for allowing pharmacy to be a part of this patient's care.  Dallie Piles, PharmD 06/22/2019 9:42 AM

## 2019-06-22 NOTE — Consult Note (Signed)
[facility] for Electrolyte Monitoring and Replacement   Recent Labs: Potassium (mmol/L)  Date Value  06/22/2019 4.3   Magnesium (mg/dL)  Date Value  06/22/2019 1.9   Calcium (mg/dL)  Date Value  06/22/2019 8.6 (L)   Albumin (g/dL)  Date Value  06/22/2019 2.1 (L)  04/21/2019 4.1   Phosphorus (mg/dL)  Date Value  06/22/2019 3.8   Sodium (mmol/L)  Date Value  06/22/2019 137  04/21/2019 142     Assessment: 53 y.o. male admitted on 06/15/2019 with hyperkalemia. He remains anuric following CRRT and is now being transitioned to HD following PermCath placement today  Goal of Therapy:  Electrolytes WNL  Plan:   1st HD session planned for today  All electrolytes currently wnl: no replacement required  F/U electrolytes in am  Dallie Piles ,PharmD Clinical Pharmacist 06/22/2019 1:27 PM

## 2019-06-22 NOTE — Progress Notes (Signed)
Pre HD Assessment:   06/22/19 1544  Neurological  Level of Consciousness Alert  Orientation Level Oriented X4  Respiratory  Respiratory Pattern Regular  Bilateral Breath Sounds Diminished  Cardiac  Pulse Regular  ECG Monitor Yes  Cardiac Rhythm NSR  Antiarrhythmic device No  Vascular  R Radial Pulse +2  L Radial Pulse +2  Edema Generalized  Psychosocial  Psychosocial (WDL) WDL

## 2019-06-22 NOTE — Progress Notes (Signed)
Post HD Tx Note   06/22/19 1745  Hand-Off documentation  Report given to (Full Name) Alfredo Martinez, RN  Report received from (Full Name) Beatris Ship, RN  Vital Signs  Temp 97.9 F (36.6 C)  Temp Source Oral  Pulse Rate 83  Pulse Rate Source Monitor  Resp 11  BP 125/74  BP Location Left Arm  BP Method Automatic  Patient Position (if appropriate) Sitting  Oxygen Therapy  SpO2 96 %  O2 Device Nasal Cannula  O2 Flow Rate (L/min) 3 L/min  Pulse Oximetry Type Continuous  Pain Assessment  Pain Scale 0-10  Pain Score 6  Pain Location Neck  Pain Orientation Right  Post-Hemodialysis Assessment  Rinseback Volume (mL) 250 mL  KECN 234 V  Dialyzer Clearance Clear  Duration of HD Treatment -hour(s) 2 hour(s)  Hemodialysis Intake (mL) 250 mL  UF Total -Machine (mL) 1489 mL  Net UF (mL) 1239 mL  Tolerated HD Treatment Yes  Education / Care Plan  Dialysis Education Provided Yes  Documented Education in Care Plan Yes  Outpatient Plan of Care Reviewed and on Chart Yes  Hemodialysis Catheter Right Internal jugular  Placement Date: 06/22/19   Placed prior to admission: No  Orientation: Right  Access Location: Internal jugular  Site Condition No complications  Blue Lumen Status Saline locked;Capped (Central line)  Red Lumen Status Saline locked;Capped (Central line)  Dressing Type Gauze/Drain sponge  Dressing Status Clean;Dry;Intact  Dressing Change Due 06/23/19  Post treatment catheter status Capped and Clamped

## 2019-06-22 NOTE — Progress Notes (Deleted)
HD Tx start   06/22/19 1545  Vital Signs  Resp 17  BP 131/80  Oxygen Therapy  SpO2 97 %  O2 Device Nasal Cannula  During Hemodialysis Assessment  Blood Flow Rate (mL/min) 300 mL/min  Arterial Pressure (mmHg) -120 mmHg  Venous Pressure (mmHg) 90 mmHg  Transmembrane Pressure (mmHg) 50 mmHg  Ultrafiltration Rate (mL/min) 990 mL/min  Dialysate Flow Rate (mL/min) 600 ml/min  Conductivity: Machine  15.3  HD Safety Checks Performed Yes  Intra-Hemodialysis Comments Progressing as prescribed

## 2019-06-22 NOTE — Progress Notes (Addendum)
Post HD Assessment:   06/22/19 1730  Neurological  Level of Consciousness Alert  Orientation Level Oriented X4  Respiratory  Respiratory Pattern Regular  Bilateral Breath Sounds Diminished  Cardiac  Pulse Regular  ECG Monitor Yes  Cardiac Rhythm NSR  Antiarrhythmic device No  Vascular  R Radial Pulse +2  L Radial Pulse +2  Edema Generalized  Psychosocial  Psychosocial (WDL) WDL    Pt tolerates HD Tx # 1 well, 1489 mL removed with Tx,. Pt c/o pain and soreness to right neck CVC. Primary care nurse notified by HD staff to request PRN pain order.

## 2019-06-22 NOTE — Care Management Important Message (Signed)
Important Message  Patient Details  Name: MICKAEL MCNUTT MRN: [id number] Date of Birth: June 23, 1966   Medicare Important Message Given:  Yes     [name] A Allmond 06/22/2019, 11:11 AM

## 2019-06-22 NOTE — Progress Notes (Signed)
[facility]  ROUNDING NOTE   Subjective:   Seated in chair.  Seen and examined on hemodialysis treatment. Tolerating treatment well. UF goal of 1.5 liters.   Objective:  Vital signs in last 24 hours:  Temp:  [97.9 F (36.6 C)-98.6 F (37 C)] (P) 97.9 F (36.6 C) (07/06 1535) Pulse Rate:  [72-88] 82 (07/06 1615) Resp:  [8-28] 15 (07/06 1615) BP: (121-154)/(74-123) 144/85 (07/06 1615) SpO2:  [90 %-100 %] 94 % (07/06 1615) FiO2 (%):  [50 %] 50 % (07/06 1020) Weight:  [122.5 kg-127.1 kg] (P) 122.5 kg (07/06 1535)  Weight change: -0.9 kg Filed Weights   06/22/19 0500 06/22/19 0936 06/22/19 1535  Weight: 127.1 kg 122.5 kg (P) 122.5 kg    Intake/Output: I/O last 3 completed shifts: In: 480 [P.O.:480] Out: 792 [Other:792]   Intake/Output this shift:  Total I/O In: 240 [P.O.:240] Out: -   Physical Exam: General: NAD,   Head: Normocephalic, atraumatic. Moist oral mucosal membranes  Eyes: Anicteric, PERRL  Neck: Supple, trachea midline  Lungs:  Clear to auscultation  Heart: Regular rate and rhythm  Abdomen:  Soft, nontender,   Extremities:  ++ peripheral edema.  Neurologic: Nonfocal, moving all four extremities  Skin: No lesions  Access: RIJ permcath, left temp HD femoral catheter.     Basic Metabolic Panel: Recent Labs  Lab 06/20/19 2325 06/21/19 0211 06/21/19 0629 06/21/19 0947 06/21/19 1130 06/22/19 0440  NA 135 136 137 135 135 137  K 3.9 4.0 4.1 4.1 4.2 4.3  CL 102 103 104 102 101 103  CO2 27 27 28 28 25 27   GLUCOSE 154* 149* 138* 194* 179* 140*  BUN 15 15 14 14 16  25*  CREATININE 2.36* 2.43* 2.39* 2.43* 2.78* 4.54*  CALCIUM 8.3* 8.2* 8.5* 8.4* 8.4* 8.6*  MG 2.0 2.0 1.8 1.8  --  1.9  PHOS 2.7 2.7 2.8 2.7 2.9 3.8    Liver Function Tests: Recent Labs  Lab 06/21/19 0211 06/21/19 0629 06/21/19 0947 06/21/19 1130 06/22/19 0440  ALBUMIN 2.0* 2.0* 2.1* 2.1* 2.1*   No results for input(s): LIPASE, AMYLASE in the last 168 hours. No  results for input(s): AMMONIA in the last 168 hours.  CBC: Recent Labs  Lab 06/16/19 0223 06/17/19 0526 06/18/19 0420 06/19/19 0558 06/20/19 0549  WBC 12.8* 11.8* 14.4* 9.3 8.2  NEUTROABS  --   --   --  6.6 5.2  HGB 9.1* 9.6* 9.5* 8.8* 8.5*  HCT 30.0* 32.0* 31.9* 28.8* 28.2*  MCV 77.3* 78.8* 79.0* 78.5* 79.4*  PLT 233 201 200 156 137*    Cardiac Enzymes: No results for input(s): CKTOTAL, CKMB, CKMBINDEX, TROPONINI in the last 168 hours.  BNP: Invalid input(s): POCBNP  CBG: Recent Labs  Lab 06/21/19 1701 06/21/19 2119 06/22/19 0743 06/22/19 0938 06/22/19 1153  GLUCAP 121* 148* 143* 122* 130*    Microbiology: Results for orders placed or performed during the hospital encounter of 06/15/19  Blood culture (routine x 2)     Status: None   Collection Time: 06/15/19  1:48 PM   Specimen: BLOOD  Result Value Ref Range Status   Specimen Description BLOOD BLOOD LEFT ARM  Final   Special Requests   Final    BOTTLES DRAWN AEROBIC AND ANAEROBIC Blood Culture adequate volume   Culture   Final    NO GROWTH 5 DAYS Performed at [facility], [address], [place], [place] [address]    Report Status 06/20/2019 FINAL  Final  SARS Coronavirus  2 (CEPHEID- Performed in [place] lab), Hosp Order     Status: None   Collection Time: 06/15/19  1:57 PM   Specimen: Nasopharyngeal Swab  Result Value Ref Range Status   SARS Coronavirus 2 NEGATIVE NEGATIVE Final    Comment: (NOTE) If result is NEGATIVE SARS-CoV-2 target nucleic acids are NOT DETECTED. The SARS-CoV-2 RNA is generally detectable in upper and lower  respiratory specimens during the acute phase of infection. The lowest  concentration of SARS-CoV-2 viral copies this assay can detect is 250  copies / mL. A negative result does not preclude SARS-CoV-2 infection  and should not be used as the sole basis for treatment or other  patient management decisions.  A negative result may occur with  improper  specimen collection / handling, submission of specimen other  than nasopharyngeal swab, presence of viral mutation(s) within the  areas targeted by this assay, and inadequate number of viral copies  (<250 copies / mL). A negative result must be combined with clinical  observations, patient history, and epidemiological information. If result is POSITIVE SARS-CoV-2 target nucleic acids are DETECTED. The SARS-CoV-2 RNA is generally detectable in upper and lower  respiratory specimens dur ing the acute phase of infection.  Positive  results are indicative of active infection with SARS-CoV-2.  Clinical  correlation with patient history and other diagnostic information is  necessary to determine patient infection status.  Positive results do  not rule out bacterial infection or co-infection with other viruses. If result is PRESUMPTIVE POSTIVE SARS-CoV-2 nucleic acids MAY BE PRESENT.   A presumptive positive result was obtained on the submitted specimen  and confirmed on repeat testing.  While 2019 novel coronavirus  (SARS-CoV-2) nucleic acids may be present in the submitted sample  additional confirmatory testing may be necessary for epidemiological  and / or clinical management purposes  to differentiate between  SARS-CoV-2 and other Sarbecovirus currently known to infect humans.  If clinically indicated additional testing with an alternate test  methodology [phone]) is advised. The SARS-CoV-2 RNA is generally  detectable in upper and lower respiratory sp ecimens during the acute  phase of infection. The expected result is Negative. Fact Sheet for Patients:  StrictlyIdeas.no Fact Sheet for Healthcare Providers: BankingDealers.co.za This test is not yet approved or cleared by the [place] FDA and has been authorized for detection and/or diagnosis of SARS-CoV-2 by FDA under an Emergency Use Authorization (EUA).  This EUA will remain in  effect (meaning this test can be used) for the duration of the COVID-19 declaration under Section 564(b)(1) of the Act, 21 U.S.C. section 360bbb-3(b)(1), unless the authorization is terminated or revoked sooner. Performed at [facility], [address] [place], [place] [address]   Blood culture (routine x 2)     Status: None   Collection Time: 06/15/19  4:40 PM   Specimen: BLOOD  Result Value Ref Range Status   Specimen Description BLOOD BLOOD RIGHT ARM  Final   Special Requests   Final    BOTTLES DRAWN AEROBIC AND ANAEROBIC Blood Culture adequate volume   Culture   Final    NO GROWTH 5 DAYS Performed at [facility], [address] [place],  [address]    Report Status 06/20/2019 FINAL  Final  MRSA PCR Screening     Status: None   Collection Time: 06/22/19  4:40 AM   Specimen: Nasal Mucosa; Nasopharyngeal  Result Value Ref Range Status   MRSA by PCR NEGATIVE NEGATIVE Final  Comment:        The GeneXpert MRSA Assay (FDA approved for NASAL specimens only), is one component of a comprehensive MRSA colonization surveillance program. It is not intended to diagnose MRSA infection nor to guide or monitor treatment for MRSA infections. Performed at [facility], [address] [place], [place] [address]     Coagulation Studies: No results for input(s): LABPROT, INR in the last 72 hours.  Urinalysis: No results for input(s): COLORURINE, LABSPEC, PHURINE, GLUCOSEU, HGBUR, BILIRUBINUR, KETONESUR, PROTEINUR, UROBILINOGEN, NITRITE, LEUKOCYTESUR in the last 72 hours.  Invalid input(s): APPERANCEUR    Imaging: Dg Shoulder 1v Right  Result Date: 06/21/2019 CLINICAL DATA:  Pain and swelling EXAM: RIGHT SHOULDER - 1 VIEW COMPARISON:  None. FINDINGS: There is no evidence of fracture or dislocation. There is no evidence of arthropathy or other focal bone abnormality. Soft tissues are unremarkable. IMPRESSION: Negative. Electronically Signed    By: Nelson Chimes M.D.   On: 06/21/2019 06:59   US Venous Img Upper Uni Right  Result Date: 06/21/2019 CLINICAL DATA:  Right arm swelling. EXAM: RIGHT UPPER EXTREMITY VENOUS DOPPLER ULTRASOUND TECHNIQUE: Gray-scale sonography with graded compression, as well as color Doppler and duplex ultrasound were performed to evaluate the upper extremity deep venous system from the level of the subclavian vein and including the jugular, axillary, basilic, radial, ulnar and upper cephalic vein. Spectral Doppler was utilized to evaluate flow at rest and with distal augmentation maneuvers. COMPARISON:  None. FINDINGS: Contralateral Subclavian Vein: Respiratory phasicity is normal and symmetric with the symptomatic side. No evidence of thrombus. Normal compressibility. Internal Jugular Vein: No evidence of thrombus. Normal compressibility, respiratory phasicity and response to augmentation. Subclavian Vein: No evidence of thrombus. Normal compressibility, respiratory phasicity and response to augmentation. Axillary Vein: No evidence of thrombus. Normal compressibility, respiratory phasicity and response to augmentation. Cephalic Vein: No evidence of thrombus. Normal compressibility, respiratory phasicity and response to augmentation. Basilic Vein: No evidence of thrombus. Normal compressibility, respiratory phasicity and response to augmentation. Brachial Veins: No evidence of thrombus. Normal compressibility, respiratory phasicity and response to augmentation. Radial Veins: No evidence of thrombus. Normal compressibility, respiratory phasicity and response to augmentation. Ulnar Veins: No evidence of thrombus. Normal compressibility, respiratory phasicity and response to augmentation. Venous Reflux:  None visualized. Other Findings:  None visualized. IMPRESSION: No evidence of DVT within the right upper extremity. Electronically Signed   By: Titus Dubin M.D.   On: 06/21/2019 05:25     Medications:    . amLODipine  5  mg Oral Daily  . atorvastatin  40 mg Oral QHS  . carvedilol  25 mg Oral Q12H  . [START ON 06/23/2019] ceFAZolin  1 g Other Q24H  . chlorhexidine  15 mL Mouth Rinse BID  . cholecalciferol  2,000 Units Oral Daily  . fentaNYL      . heparin      . heparin  5,000 Units Subcutaneous Q8H  . hydrALAZINE  25 mg Oral Q8H  . hydrocerin   Topical TID  . insulin aspart  0-15 Units Subcutaneous TID WC  . insulin aspart  0-5 Units Subcutaneous QHS  . isosorbide mononitrate  60 mg Oral Daily  . mouth rinse  15 mL Mouth Rinse q12n4p  . midazolam      . sodium chloride flush  10 mL Intracatheter Q12H  . umeclidinium-vilanterol  1 puff Inhalation Daily   acetaminophen **OR** acetaminophen, albuterol, alum & mag hydroxide-simeth, bisacodyl, ondansetron **OR** ondansetron (ZOFRAN) IV, oxyCODONE-acetaminophen, senna-docusate, sodium chloride  flush, traZODone  Assessment/ Plan:  [name] is a 53 y.o. black male with Bell's palsy, congestive heart failure, diabetes mellitus type 2, hypertension, obstructive sleep apnea, psoriasis, pulmonary hypertension who was admitted to [facility] on6/29/2020for evaluation of severe acute renal failure in the setting of generalized weakness. Placed on CRRT on 6/29 to 7/5. First intermittent hemodialysis treatment today 7/6.   1. Acute renal failure on chronic kidney disease stage II Baseline EGFR 64 Acute renal failure secondary to prerenal azotemia leading to ATN from dehydration/hypovolemia Chronic kidney disease secondary to diabetic nephropathy. Strong family history with brother with ESRD on hemodialysis.  Seen and examined on intermittent hemodialysis 7/6.  - remove temp femoral catheter.  - Dialysis for three treatments consecutively.  - Outpatient planning for [place]   2. Hypertension with volume overload. Echocardiogram with preserved systolic function.  Currently on amlodipine, carvedilol, isosorbide mononitrate and hydralazine.  Home  regimen includes furosemide.   3. Anemia of chronic kidney disease: hemoglobin of 8.5, microcytic.  - EPO ordered with dialysis treatment.    LOS: 7   7/6/20204:55 PM

## 2019-06-22 NOTE — Progress Notes (Signed)
HD Tx end Note:   06/22/19 1730  Vital Signs  Temp 97.9 F (36.6 C)  Temp Source Oral  Pulse Rate 83  Pulse Rate Source Monitor  Resp 12  BP 134/80  BP Location Left Arm  BP Method Automatic  Patient Position (if appropriate) Sitting  Oxygen Therapy  SpO2 96 %  O2 Device Nasal Cannula  O2 Flow Rate (L/min) 3 L/min  Pulse Oximetry Type Continuous  Pain Assessment  Pain Scale 0-10  Pain Score 6  Pain Type Acute pain  Pain Location Neck  Pain Orientation Right  Pain Descriptors / Indicators Aching;Sore  Pain Frequency Constant  Pain Onset On-going  During Hemodialysis Assessment  Blood Flow Rate (mL/min) 300 mL/min  Arterial Pressure (mmHg) -150 mmHg  Venous Pressure (mmHg) 120 mmHg  Transmembrane Pressure (mmHg) 50 mmHg  Ultrafiltration Rate (mL/min) 990 mL/min  Dialysate Flow Rate (mL/min) 600 ml/min  Conductivity: Machine  14  HD Safety Checks Performed Yes  Intra-Hemodialysis Comments Tolerated well

## 2019-06-22 NOTE — Progress Notes (Signed)
Patient ID: [name], male   DOB: 03-10-1966, 53 y.o.   MRN: [id number]  [facility] PROGRESS NOTE  [name] BJS:[id number] DOB: [birth year] DOA: 06/15/2019 PCP: Lavera Guise, MD  HPI/Subjective: Patient feels okay.  Breathing okay.  Patient states he chronically wears 3 L of oxygen.  He has CHF and COPD.  Patient's left leg feels pain and warmth.  Patient states he finally made some urine.  Objective: Vitals:   06/22/19 1130 06/22/19 1148  BP: 134/74 121/75  Pulse: 76 73  Resp:  20  Temp:  97.9 F (36.6 C)  SpO2: 94% 97%    Intake/Output Summary (Last 24 hours) at 06/22/2019 1601 Last data filed at 06/22/2019 1325 Gross per 24 hour  Intake 480 ml  Output -  Net 480 ml   Filed Weights   06/21/19 0214 06/22/19 0500 06/22/19 0936  Weight: 128 kg 127.1 kg 122.5 kg    ROS: Review of Systems  Constitutional: Negative for chills and fever.  Eyes: Negative for blurred vision.  Respiratory: Negative for cough and shortness of breath.   Cardiovascular: Negative for chest pain.  Gastrointestinal: Negative for abdominal pain, constipation, diarrhea, nausea and vomiting.  Genitourinary: Negative for dysuria.  Musculoskeletal: Positive for joint pain.  Neurological: Negative for dizziness and headaches.   Exam: Physical Exam  Constitutional: He is oriented to person, place, and time.  HENT:  Nose: No mucosal edema.  Mouth/Throat: No oropharyngeal exudate or posterior oropharyngeal edema.  Eyes: Pupils are equal, round, and reactive to light. Conjunctivae, EOM and lids are normal.  Neck: No JVD present. Carotid bruit is not present. No edema present. No thyroid mass and no thyromegaly present.  Cardiovascular: S1 normal and S2 normal. Exam reveals no gallop.  No murmur heard. Pulses:      Dorsalis pedis pulses are 2+ on the right side and 2+ on the left side.  Respiratory: No respiratory distress. He has decreased breath sounds in the right lower field and  the left lower field. He has no wheezes. He has no rhonchi. He has no rales.  GI: Soft. Bowel sounds are normal. There is no abdominal tenderness.  Musculoskeletal:     Right ankle: He exhibits swelling.     Left ankle: He exhibits swelling.  Lymphadenopathy:    He has no cervical adenopathy.  Neurological: He is alert and oriented to person, place, and time. No cranial nerve deficit.  Skin: Skin is warm. Nails show no clubbing.  Chronic lower extremity discoloration bilateral lower extremities.  Left leg laterally and anteriorly red and warm and tender to palpation.  Psychiatric: He has a normal mood and affect.      Data Reviewed: Basic Metabolic Panel: Recent Labs  Lab 06/20/19 2325 06/21/19 0211 06/21/19 0629 06/21/19 0947 06/21/19 1130 06/22/19 0440  NA 135 136 137 135 135 137  K 3.9 4.0 4.1 4.1 4.2 4.3  CL 102 103 104 102 101 103  CO2 27 27 28 28 25 27   GLUCOSE 154* 149* 138* 194* 179* 140*  BUN 15 15 14 14 16  25*  CREATININE 2.36* 2.43* 2.39* 2.43* 2.78* 4.54*  CALCIUM 8.3* 8.2* 8.5* 8.4* 8.4* 8.6*  MG 2.0 2.0 1.8 1.8  --  1.9  PHOS 2.7 2.7 2.8 2.7 2.9 3.8   Liver Function Tests: Recent Labs  Lab 06/21/19 0211 06/21/19 0629 06/21/19 0947 06/21/19 1130 06/22/19 0440  ALBUMIN 2.0* 2.0* 2.1* 2.1* 2.1*   CBC: Recent Labs  Lab 06/16/19 0223  06/17/19 0526 06/18/19 0420 06/19/19 0558 06/20/19 0549  WBC 12.8* 11.8* 14.4* 9.3 8.2  NEUTROABS  --   --   --  6.6 5.2  HGB 9.1* 9.6* 9.5* 8.8* 8.5*  HCT 30.0* 32.0* 31.9* 28.8* 28.2*  MCV 77.3* 78.8* 79.0* 78.5* 79.4*  PLT 233 201 200 156 137*   BNP (last 3 results) Recent Labs    02/18/19 1253 06/15/19 1348  BNP 165.0* 173.0*    CBG: Recent Labs  Lab 06/21/19 1701 06/21/19 2119 06/22/19 0743 06/22/19 0938 06/22/19 1153  GLUCAP 121* 148* 143* 122* 130*    Recent Results (from the past 240 hour(s))  Blood culture (routine x 2)     Status: None   Collection Time: 06/15/19  1:48 PM   Specimen:  BLOOD  Result Value Ref Range Status   Specimen Description BLOOD BLOOD LEFT ARM  Final   Special Requests   Final    BOTTLES DRAWN AEROBIC AND ANAEROBIC Blood Culture adequate volume   Culture   Final    NO GROWTH 5 DAYS Performed at [facility], [address] [place],  [address]    Report Status 06/20/2019 FINAL  Final  SARS Coronavirus 2 (CEPHEID- Performed in [facility] hospital lab), Hosp Order     Status: None   Collection Time: 06/15/19  1:57 PM   Specimen: Nasopharyngeal Swab  Result Value Ref Range Status   SARS Coronavirus 2 NEGATIVE NEGATIVE Final    Comment: (NOTE) If result is NEGATIVE SARS-CoV-2 target nucleic acids are NOT DETECTED. The SARS-CoV-2 RNA is generally detectable in upper and lower  respiratory specimens during the acute phase of infection. The lowest  concentration of SARS-CoV-2 viral copies this assay can detect is 250  copies / mL. A negative result does not preclude SARS-CoV-2 infection  and should not be used as the sole basis for treatment or other  patient management decisions.  A negative result may occur with  improper specimen collection / handling, submission of specimen other  than nasopharyngeal swab, presence of viral mutation(s) within the  areas targeted by this assay, and inadequate number of viral copies  (<250 copies / mL). A negative result must be combined with clinical  observations, patient history, and epidemiological information. If result is POSITIVE SARS-CoV-2 target nucleic acids are DETECTED. The SARS-CoV-2 RNA is generally detectable in upper and lower  respiratory specimens dur ing the acute phase of infection.  Positive  results are indicative of active infection with SARS-CoV-2.  Clinical  correlation with patient history and other diagnostic information is  necessary to determine patient infection status.  Positive results do  not rule out bacterial infection or co-infection with other viruses. If  result is PRESUMPTIVE POSTIVE SARS-CoV-2 nucleic acids MAY BE PRESENT.   A presumptive positive result was obtained on the submitted specimen  and confirmed on repeat testing.  While 2019 novel coronavirus  (SARS-CoV-2) nucleic acids may be present in the submitted sample  additional confirmatory testing may be necessary for epidemiological  and / or clinical management purposes  to differentiate between  SARS-CoV-2 and other Sarbecovirus currently known to infect humans.  If clinically indicated additional testing with an alternate test  methodology [id number]) is advised. The SARS-CoV-2 RNA is generally  detectable in upper and lower respiratory sp ecimens during the acute  phase of infection. The expected result is Negative. Fact Sheet for Patients:  StrictlyIdeas.no Fact Sheet for Healthcare Providers: BankingDealers.co.za This test is not yet approved or cleared  by the [place] and has been authorized for detection and/or diagnosis of SARS-CoV-2 by FDA under an Emergency Use Authorization (EUA).  This EUA will remain in effect (meaning this test can be used) for the duration of the COVID-19 declaration under Section 564(b)(1) of the Act, 21 U.S.C. section 360bbb-3(b)(1), unless the authorization is terminated or revoked sooner. Performed at [facility], [address] [place], [place] [address]   Blood culture (routine x 2)     Status: None   Collection Time: 06/15/19  4:40 PM   Specimen: BLOOD  Result Value Ref Range Status   Specimen Description BLOOD BLOOD RIGHT ARM  Final   Special Requests   Final    BOTTLES DRAWN AEROBIC AND ANAEROBIC Blood Culture adequate volume   Culture   Final    NO GROWTH 5 DAYS Performed at [facility], [address] [place], [place] [address]    Report Status 06/20/2019 FINAL  Final  MRSA PCR Screening     Status: None   Collection Time: 06/22/19  4:40 AM    Specimen: Nasal Mucosa; Nasopharyngeal  Result Value Ref Range Status   MRSA by PCR NEGATIVE NEGATIVE Final    Comment:        The GeneXpert MRSA Assay (FDA approved for NASAL specimens only), is one component of a comprehensive MRSA colonization surveillance program. It is not intended to diagnose MRSA infection nor to guide or monitor treatment for MRSA infections. Performed at [facility], [address] [place], [place] [address]      Studies: Dg Shoulder 1v Right  Result Date: 06/21/2019 CLINICAL DATA:  Pain and swelling EXAM: RIGHT SHOULDER - 1 VIEW COMPARISON:  None. FINDINGS: There is no evidence of fracture or dislocation. There is no evidence of arthropathy or other focal bone abnormality. Soft tissues are unremarkable. IMPRESSION: Negative. Electronically Signed   By: Nelson Chimes M.D.   On: 06/21/2019 06:59   US Venous Img Upper Uni Right  Result Date: 06/21/2019 CLINICAL DATA:  Right arm swelling. EXAM: RIGHT UPPER EXTREMITY VENOUS DOPPLER ULTRASOUND TECHNIQUE: Gray-scale sonography with graded compression, as well as color Doppler and duplex ultrasound were performed to evaluate the upper extremity deep venous system from the level of the subclavian vein and including the jugular, axillary, basilic, radial, ulnar and upper cephalic vein. Spectral Doppler was utilized to evaluate flow at rest and with distal augmentation maneuvers. COMPARISON:  None. FINDINGS: Contralateral Subclavian Vein: Respiratory phasicity is normal and symmetric with the symptomatic side. No evidence of thrombus. Normal compressibility. Internal Jugular Vein: No evidence of thrombus. Normal compressibility, respiratory phasicity and response to augmentation. Subclavian Vein: No evidence of thrombus. Normal compressibility, respiratory phasicity and response to augmentation. Axillary Vein: No evidence of thrombus. Normal compressibility, respiratory phasicity and response to augmentation. Cephalic  Vein: No evidence of thrombus. Normal compressibility, respiratory phasicity and response to augmentation. Basilic Vein: No evidence of thrombus. Normal compressibility, respiratory phasicity and response to augmentation. Brachial Veins: No evidence of thrombus. Normal compressibility, respiratory phasicity and response to augmentation. Radial Veins: No evidence of thrombus. Normal compressibility, respiratory phasicity and response to augmentation. Ulnar Veins: No evidence of thrombus. Normal compressibility, respiratory phasicity and response to augmentation. Venous Reflux:  None visualized. Other Findings:  None visualized. IMPRESSION: No evidence of DVT within the right upper extremity. Electronically Signed   By: Titus Dubin M.D.   On: 06/21/2019 05:25    Scheduled Meds: . amLODipine  5 mg Oral Daily  .  atorvastatin  40 mg Oral QHS  . carvedilol  25 mg Oral Q12H  . [START ON 06/23/2019] ceFAZolin  1 g Other Q24H  . chlorhexidine  15 mL Mouth Rinse BID  . cholecalciferol  2,000 Units Oral Daily  . fentaNYL      . heparin      . heparin  5,000 Units Subcutaneous Q8H  . hydrALAZINE  25 mg Oral Q8H  . hydrocerin   Topical TID  . insulin aspart  0-15 Units Subcutaneous TID WC  . insulin aspart  0-5 Units Subcutaneous QHS  . isosorbide mononitrate  60 mg Oral Daily  . mouth rinse  15 mL Mouth Rinse q12n4p  . midazolam      . sodium chloride flush  10 mL Intracatheter Q12H  . umeclidinium-vilanterol  1 puff Inhalation Daily   Continuous Infusions:  Assessment/Plan:  1. Acute on chronic kidney injury.  Progressed to end-stage renal disease.  Patient had PermCath placed today and will need outpatient dialysis slot. 2. Acute on chronic respiratory failure secondary to severe COPD.  Patient on baseline chronic 3 L of oxygen.  Patient is a candidate for noninvasive ventilation at night. 3. Acute on chronic diastolic congestive heart failure.  Dialysis now to manage fluid.  Patient on low-dose  Coreg.  Also on hydralazine and Imdur. 4. Left lower extremity cellulitis on Ancef. 5. Hyperkalemia treated with dialysis, 6. Essential hypertension on Coreg, Norvasc, indoor and hydralazine 7. Hyperlipidemia unspecified on atorvastatin 8. Type 2 diabetes mellitus.  All sugars have been good.  Patient on sliding scale.  Even though hemoglobin A1c elevated at 14.7.  Code Status:     Code Status Orders  (From admission, onward)         Start     Ordered   06/15/19 1831  Full code  Continuous     06/15/19 1830        Code Status History    Date Active Date Inactive Code Status Order ID Comments User Context   02/18/2019 1814 02/24/2019 1854 Full Code [id number]  Velna Ochs, MD ED   01/09/2018 1753 01/14/2018 1847 Full Code [id number]  Epifanio Lesches, MD ED   11/20/2016 2148 11/29/2016 1941 Full Code [id number]  Vaughan Basta, MD Inpatient   Advance Care Planning Activity     Disposition Plan: We will need outpatient dialysis slot.  Consultants:  Nephrology  Time spent: 28 minutes  [place]

## 2019-06-22 NOTE — Op Note (Signed)
OPERATIVE NOTE    PRE-OPERATIVE DIAGNOSIS: 1. ESRD   POST-OPERATIVE DIAGNOSIS: same as above  PROCEDURE: 1. Ultrasound guidance for vascular access to the right internal jugular vein 2. Fluoroscopic guidance for placement of catheter 3. Placement of a 19 cm tip to cuff tunneled hemodialysis catheter via the right internal jugular vein  SURGEON: Leotis Pain, MD  ANESTHESIA:  Local with Moderate conscious sedation for approximately 20 minutes using 3 mg of Versed and 75 mcg of Fentanyl  ESTIMATED BLOOD LOSS: 10 cc  FLUORO TIME: less than one minute  CONTRAST: none  FINDING(S): 1.  Patent right internal jugular vein  SPECIMEN(S):  None  INDICATIONS:   [name] is a 53 y.o.male who presents with renal failure.  The patient needs long term dialysis access for their ESRD, and a Permcath is necessary.  Risks and benefits are discussed and informed consent is obtained.    DESCRIPTION: After obtaining full informed written consent, the patient was brought back to the vascular suited. The patient's right neck and chest were sterilely prepped and draped in a sterile surgical field was created. Moderate conscious sedation was administered during a face to face encounter with the patient throughout the procedure with my supervision of the RN administering medicines and monitoring the patient's vital signs, pulse oximetry, telemetry and mental status throughout from the start of the procedure until the patient was taken to the recovery room.  The right internal jugular vein was visualized with ultrasound and found to be patent. It was then accessed under direct ultrasound guidance and a permanent image was recorded. A wire was placed. After skin nick and dilatation, the peel-away sheath was placed over the wire. I then turned my attention to an area under the clavicle. Approximately 1-2 fingerbreadths below the clavicle a small counterincision was created and tunneled from the  subclavicular incision to the access site. Using fluoroscopic guidance, a 19 centimeter tip to cuff tunneled hemodialysis catheter was selected, and tunneled from the subclavicular incision to the access site. It was then placed through the peel-away sheath and the peel-away sheath was removed. Using fluoroscopic guidance the catheter tips were parked in the right atrium. The appropriate distal connectors were placed. It withdrew blood well and flushed easily with heparinized saline and a concentrated heparin solution was then placed. It was secured to the chest wall with 2 Prolene sutures. The access incision was closed single 4-0 Monocryl. A 4-0 Monocryl pursestring suture was placed around the exit site. Sterile dressings were placed. The patient tolerated the procedure well and was taken to the recovery room in stable condition.  COMPLICATIONS: None  CONDITION: Stable  Leotis Pain, MD 06/22/2019 10:48 AM   This note was created with Dragon Medical transcription system. Any errors in dictation are purely unintentional.

## 2019-06-22 NOTE — Progress Notes (Signed)
Has an order to remove temporary trialysis cath but pt is sitting up right now waiting for his supper. Night RN will perform the procedure.

## 2019-06-22 NOTE — Progress Notes (Signed)
Pre HD Tx note   06/22/19 1535  Vital Signs  Temp 97.9 F (36.6 C)  Temp Source Oral  Pulse Rate 77  Pulse Rate Source Monitor  Resp (!) 28  BP 127/79  BP Location Left Arm  BP Method Automatic  Patient Position (if appropriate) Sitting  Oxygen Therapy  SpO2 97 %  O2 Device Nasal Cannula  O2 Flow Rate (L/min) 3 L/min  Pulse Oximetry Type Continuous  Pain Assessment  Pain Scale 0-10  Pain Type Acute pain  Pain Location Neck  Pain Orientation Right  Pain Descriptors / Indicators Sore  Pain Frequency Constant  Pain Onset On-going  Patients Stated Pain Goal 2  Pain Intervention(s) Medication (See eMAR)  Multiple Pain Sites No  Dialysis Weight  Weight 122.5 kg  Type of Weight Pre-Dialysis  Time-Out for Hemodialysis  What Procedure? HD #1  Pt Identifiers(min of two) First/Last Name;MRN/Account#;Pt's DOB(use if MRN/Acct# not available  Correct Site? Yes  Correct Side? Yes  Correct Procedure? Yes  Consents Verified? Yes  Rad Studies Available? Yes  Safety Precautions Reviewed? Yes  Engineer, civil (consulting) Number 2  Station Number 4  UF/Alarm Test Passed  Conductivity: Meter 14  Conductivity: Machine  14  pH 7.4  Normal Saline Lot Number [id number]  Dialyzer Lot Number [id number]  Machine Temperature 97.7 F (36.5 C)  Musician and Audible Yes  Blood Lines Intact and Secured Yes  Pre Treatment Patient Checks  Vascular access used during treatment Catheter  HD catheter dressing before treatment WDL (Change due 06/23/19)  Patient is receiving dialysis in a chair Yes  Hepatitis B Surface Antigen Results Pending (drawn 06/22/19)  Isolation Initiated Yes  Hepatitis B Surface Antibody  (pending, drawn 06/22/19)  Date Hepatitis B Surface Antibody Drawn 06/22/19  Hemodialysis Consent Verified Yes  Hemodialysis Standing Orders Initiated Yes  ECG (Telemetry) Monitor On Yes  Prime Ordered Normal Saline  Length of  DialysisTreatment -hour(s) 2 Hour(s)  Dialyzer Optiflux  180 NR  Dialysate 2K;2.5 Ca  Dialysate Flow Ordered 600  Blood Flow Rate Ordered 300 mL/min  Ultrafiltration Goal 2000 Liters  Pre Treatment Labs Hepatitis B Surface Antigen  Dialysis Blood Pressure Support Ordered Normal Saline  Hemodialysis Catheter Right Internal jugular  Placement Date: 06/22/19   Placed prior to admission: No  Orientation: Right  Access Location: Internal jugular  Site Condition Painful  Blue Lumen Status Capped (Central line);Saline locked  Red Lumen Status Capped (Central line);Saline locked  Dressing Type Gauze/Drain sponge  Dressing Status Clean;Dry;Intact  Drainage Description None  Dressing Change Due 06/23/19 (24 hrs post placement)  Post treatment catheter status Capped and Clamped

## 2019-06-22 NOTE — Consult Note (Signed)
[facility] Consult Note  MRN : [id number]  [name] is a 53 y.o. ([birth year]) male who presents with chief complaint of  Chief Complaint  Patient presents with  . Shortness of Breath  . Weakness   History of Present Illness:  The patient is a 53 year old male with a significant past medical history (see below) including chronic kidney disease who presented to the [facility] emergency department on June 15, 2019 with a chief complaint of weakness and shortness of breath.  The patient endorsed a history of progressively worsening shortness of breath and generalized weakness which prompted him to seek medical attention in the emergency department.  The patient notices his symptoms progressively worsening over the last 2 weeks.  He denies any fever, nausea vomiting.  Chest pain.  He also notes bilateral lower extremity leg swelling which has significantly worsened.  The patient was found to be in severe acute on chronic renal failure.  The patient was admitted a temporary dialysis catheter was placed by the ICU staff and CRRT was initiated.  At this time, the nephrologist would like to transition the patient to hemodialysis.  The patient will need prolonged outpatient dialysis and at this time he does not have an appropriate dialysis access.  Vascular surgery was consulted by Dr. Holley Raring for PermCath placement.  Current Facility-Administered Medications  Medication Dose Route Frequency Provider Last Rate Last Dose  . [MAR Hold] acetaminophen (TYLENOL) tablet 650 mg  650 mg Oral Q6H PRN Demetrios Loll, MD       Or  . Doug Sou Hold] acetaminophen (TYLENOL) suppository 650 mg  650 mg Rectal Q6H PRN Demetrios Loll, MD      . Doug Sou Hold] albuterol (VENTOLIN HFA) 108 (90 Base) MCG/ACT inhaler 2 puff  2 puff Inhalation Q4H PRN Demetrios Loll, MD      . Doug Sou Hold] alum & mag hydroxide-simeth (MAALOX/MYLANTA) 200-200-20 MG/5ML suspension 30 mL  30 mL Oral  Q6H PRN Demetrios Loll, MD      . Doug Sou Hold] amLODipine (NORVASC) tablet 5 mg  5 mg Oral Daily Demetrios Loll, MD   5 mg at 06/22/19 0817  . [MAR Hold] atorvastatin (LIPITOR) tablet 40 mg  40 mg Oral QHS Demetrios Loll, MD   40 mg at 06/21/19 2147  . [MAR Hold] bisacodyl (DULCOLAX) suppository 10 mg  10 mg Rectal Daily PRN Awilda Bill, NP      . Doug Sou Hold] carvedilol (COREG) tablet 25 mg  25 mg Oral Q12H Demetrios Loll, MD   25 mg at 06/22/19 0817  . ceFAZolin (ANCEF) IVPB 1 g/50 mL premix  1 g Intravenous 30 min Pre-Op Algernon Huxley, MD      . Doug Sou Hold] chlorhexidine (PERIDEX) 0.12 % solution 15 mL  15 mL Mouth Rinse BID Loletha Grayer, MD      . Doug Sou Hold] cholecalciferol (VITAMIN D3) tablet 2,000 Units  2,000 Units Oral Daily Charlett Nose, RPH   2,000 Units at 06/21/19 0350  . fentaNYL (SUBLIMAZE) 100 MCG/2ML injection           . heparin 10000 UNIT/ML injection           . [MAR Hold] heparin injection 5,000 Units  5,000 Units Subcutaneous Q8H Demetrios Loll, MD   5,000 Units at 06/22/19 [phone]  . [MAR Hold] hydrALAZINE (APRESOLINE) tablet 25 mg  25 mg Oral Q8H Demetrios Loll, MD   25 mg at 06/22/19 0507  . [MAR Hold] hydrocerin (  EUCERIN) cream   Topical TID Ottie Glazier, MD      . Doug Sou Hold] insulin aspart (novoLOG) injection 0-15 Units  0-15 Units Subcutaneous TID WC Ottie Glazier, MD   3 Units at 06/22/19 0809  . [MAR Hold] insulin aspart (novoLOG) injection 0-5 Units  0-5 Units Subcutaneous QHS Ottie Glazier, MD      . Doug Sou Hold] isosorbide mononitrate (IMDUR) 24 hr tablet 60 mg  60 mg Oral Daily Demetrios Loll, MD   60 mg at 06/22/19 0817  . [facility] Hold] MEDLINE mouth rinse  15 mL Mouth Rinse q12n4p Wieting, Richard, MD      . midazolam (VERSED) 5 MG/5ML injection           . [MAR Hold] ondansetron (ZOFRAN) tablet 4 mg  4 mg Oral Q6H PRN Demetrios Loll, MD       Or  . Doug Sou Hold] ondansetron [facility]) injection 4 mg  4 mg Intravenous Q6H PRN Demetrios Loll, MD   4 mg at 06/17/19 1547  . [MAR Hold]  oxyCODONE-acetaminophen (PERCOCET/ROXICET) 5-325 MG per tablet 1-2 tablet  1-2 tablet Oral Q6H PRN Awilda Bill, NP   1 tablet at 06/22/19 0507  . [MAR Hold] senna-docusate (Senokot-S) tablet 1 tablet  1 tablet Oral QHS PRN Demetrios Loll, MD      . Doug Sou Hold] sodium chloride flush (NS) 0.9 % injection 10 mL  10 mL Intracatheter Q12H Ottie Glazier, MD   10 mL at 06/21/19 2153  . [MAR Hold] sodium chloride flush (NS) 0.9 % injection 10-40 mL  10-40 mL Intracatheter PRN Ottie Glazier, MD      . Doug Sou Hold] traZODone (DESYREL) tablet 25-50 mg  25-50 mg Oral QHS PRN Demetrios Loll, MD      . Doug Sou Hold] umeclidinium-vilanterol [facility] ELLIPTA) 62.5-25 MCG/INH 1 puff  1 puff Inhalation Daily Demetrios Loll, MD   1 puff at 06/22/19 5102   Past Medical History:  Diagnosis Date  . Bell's palsy   . CHF (congestive heart failure) ([place])   . Chronic kidney disease   . Diabetes mellitus without complication ([place])   . Hypertension   . NSVT (nonsustained ventricular tachycardia) ([place])   . Obstructive sleep apnea   . Psoriasis   . Pulmonary HTN ([place])    Past Surgical History:  Procedure Laterality Date  . CORONARY ANGIOPLASTY WITH STENT PLACEMENT    . NO PAST SURGERIES     Social History Social History   Tobacco Use  . Smoking status: Never Smoker  . Smokeless tobacco: Never Used  Substance Use Topics  . Alcohol use: Yes    Alcohol/week: 2.0 standard drinks    Types: 1 Shots of liquor, 1 Cans of beer per week    Comment: very rarely 1-2 times per month  . Drug use: No   Family History Family History  Problem Relation Age of Onset  . Heart failure Mother   . Kidney failure Brother   Denies family history of peripheral artery disease, venous disease and/or bleeding/clotting disorders.  Allergies  Allergen Reactions  . Shellfish Allergy Anaphylaxis    Face and throat swelling, difficulty breathing Allergy can be triggered by touching (contact)   REVIEW OF SYSTEMS (Negative unless  checked)  Constitutional: [] Weight loss  [] Fever  [] Chills Cardiac: [] Chest pain   [] Chest pressure   [] Palpitations   [x] Shortness of breath when laying flat   [x] Shortness of breath at rest   [x] Shortness of breath with exertion. Vascular:  [] Pain in legs with  walking   [] Pain in legs at rest   [] Pain in legs when laying flat   [] Claudication   [] Pain in feet when walking  [] Pain in feet at rest  [] Pain in feet when laying flat   [] History of DVT   [] Phlebitis   [x] Swelling in legs   [] Varicose veins   [] Non-healing ulcers Pulmonary:   [] Uses home oxygen   [] Productive cough   [] Hemoptysis   [] Wheeze  [x] COPD   [] Asthma Neurologic:  [] Dizziness  [] Blackouts   [] Seizures   [] History of stroke   [] History of TIA  [] Aphasia   [] Temporary blindness   [] Dysphagia   [] Weakness or numbness in arms   [] Weakness or numbness in legs Musculoskeletal:  [] Arthritis   [] Joint swelling   [] Joint pain   [] Low back pain Hematologic:  [] Easy bruising  [] Easy bleeding   [] Hypercoagulable state   [] Anemic  [] Hepatitis Gastrointestinal:  [] Blood in stool   [] Vomiting blood  [] Gastroesophageal reflux/heartburn   [] Difficulty swallowing. Genitourinary:  [x] Chronic kidney disease   [] Difficult urination  [] Frequent urination  [] Burning with urination   [] Blood in urine Skin:  [] Rashes   [] Ulcers   [] Wounds Psychological:  [] History of anxiety   []  History of major depression.  Physical Examination  Vitals:   06/22/19 0423 06/22/19 0500 06/22/19 0818 06/22/19 0936  BP: (!) 145/88  (!) 141/85 136/90  Pulse: 73  84 86  Resp: 20  20 (!) 24  Temp: 98 F (36.7 C)  98 F (36.7 C) 98.3 F (36.8 C)  TempSrc: Oral  Oral Oral  SpO2: 100%  95% 90%  Weight:  127.1 kg  122.5 kg  Height:    5\' 3"  (1.6 m)   Body mass index is 47.83 kg/m. Gen:  WD/WN, NAD, Obese Head: [place]/AT, No temporalis wasting. Prominent temp pulse not noted. Ear/Nose/Throat: Hearing grossly intact, nares w/o erythema or drainage, oropharynx w/o  Erythema/Exudate Eyes: Sclera non-icteric, conjunctiva clear Neck: Trachea midline.  No JVD.  Pulmonary:  Good air movement, respirations not labored, equal bilaterally.  Cardiac: RRR, normal S1, S2. Vascular:  Vessel Right Left  Radial Palpable Palpable  Ulnar Palpable Palpable  Brachial Palpable Palpable  Carotid Palpable, without bruit Palpable, without bruit  Aorta Not palpable N/A  Femoral Palpable Palpable  Popliteal Palpable Palpable  PT Palpable Palpable  DP Palpable Palpable   Gastrointestinal: soft, non-tender/non-distended. No guarding/reflex.  Musculoskeletal: M/S 5/5 throughout.  Extremities without ischemic changes.  No deformity or atrophy. Moderate bilateral edema.  Neurologic: Sensation grossly intact in extremities.  Symmetrical.  Speech is fluent. Motor exam as listed above. Psychiatric: Judgment intact, Mood & affect appropriate for pt's clinical situation. Dermatologic: No rashes or ulcers noted.  No cellulitis or open wounds. Lymph : No Cervical, Axillary, or Inguinal lymphadenopathy.  CBC Lab Results  Component Value Date   WBC 8.2 06/20/2019   HGB 8.5 (L) 06/20/2019   HCT 28.2 (L) 06/20/2019   MCV 79.4 (L) 06/20/2019   PLT 137 (L) 06/20/2019   BMET    Component Value Date/Time   NA 137 06/22/2019 0440   NA 142 04/21/2019 1334   K 4.3 06/22/2019 0440   CL 103 06/22/2019 0440   CO2 27 06/22/2019 0440   GLUCOSE 140 (H) 06/22/2019 0440   BUN 25 (H) 06/22/2019 0440   BUN 21 04/21/2019 1334   CREATININE 4.54 (H) 06/22/2019 0440   CALCIUM 8.6 (L) 06/22/2019 0440   GFRNONAA 14 (L) 06/22/2019 0440   GFRAA 16 (L) 06/22/2019 0440  Estimated Creatinine Clearance: 22.9 mL/min (A) (by C-G formula based on SCr of 4.54 mg/dL (H)).  COAG Lab Results  Component Value Date   INR 1.1 06/15/2019   INR 1.1 02/18/2019   Radiology Dg Shoulder 1v Right  Result Date: 06/21/2019 CLINICAL DATA:  Pain and swelling EXAM: RIGHT SHOULDER - 1 VIEW COMPARISON:  None.  FINDINGS: There is no evidence of fracture or dislocation. There is no evidence of arthropathy or other focal bone abnormality. Soft tissues are unremarkable. IMPRESSION: Negative. Electronically Signed   By: Nelson Chimes M.D.   On: 06/21/2019 06:59   Dg Abd 1 View  Result Date: 06/18/2019 CLINICAL DATA:  Acute generalized abdominal pain. EXAM: ABDOMEN - 1 VIEW COMPARISON:  None. FINDINGS: The bowel gas pattern is normal. No radio-opaque calculi or other significant radiographic abnormality are seen. IMPRESSION: No evidence of bowel obstruction or ileus. Electronically Signed   By: Marijo Conception M.D.   On: 06/18/2019 17:04   US Renal  Result Date: 06/16/2019 CLINICAL DATA:  Renal failure. EXAM: RENAL / URINARY TRACT ULTRASOUND COMPLETE COMPARISON:  None. FINDINGS: Right Kidney: Renal measurements: 11.7 x 5.9 x 5 6 cm = volume: 201 mL. Increased cortical echogenicity. Left Kidney: Renal measurements: 11.9 x 7.4 x 7.3 cm = volume: 336.2 mL. Contains a 1.3 cm mass, too small to completely characterize thought to be a cyst at time of real-time imaging. Increased cortical echogenicity. Bladder: Limited evaluation due to body habitus. There is a rounded hypoechoic/anechoic mass with calcifications adjacent to the right side of the bladder measuring 5.3 x 4.8 x 4.5 cm. IMPRESSION: 1. Increased cortical echogenicity in both kidneys consistent with medical renal disease. 2. There is a hypoechoic/anechoic mass with calcifications adjacent to right side of the bladder. This mass measures 5.3 x 4.8 x 4.5 cm. It is possible this mass could represent a bladder diverticulum containing bladder stones. A mass adjacent to the bladder is possible. Recommend a CT scan for better evaluation 3. A 1.3 cm hypoechoic mass in the left kidney is too small to characterize and nonspecific. While this may represent a mildly complicated cyst, solid mass is not completely excluded. Recommend attention to this region on the recommended CT  scan to evaluate the pelvis. Electronically Signed   By: Dorise Bullion III M.D   On: 06/16/2019 12:06   US Venous Img Upper Uni Right  Result Date: 06/21/2019 CLINICAL DATA:  Right arm swelling. EXAM: RIGHT UPPER EXTREMITY VENOUS DOPPLER ULTRASOUND TECHNIQUE: Gray-scale sonography with graded compression, as well as color Doppler and duplex ultrasound were performed to evaluate the upper extremity deep venous system from the level of the subclavian vein and including the jugular, axillary, basilic, radial, ulnar and upper cephalic vein. Spectral Doppler was utilized to evaluate flow at rest and with distal augmentation maneuvers. COMPARISON:  None. FINDINGS: Contralateral Subclavian Vein: Respiratory phasicity is normal and symmetric with the symptomatic side. No evidence of thrombus. Normal compressibility. Internal Jugular Vein: No evidence of thrombus. Normal compressibility, respiratory phasicity and response to augmentation. Subclavian Vein: No evidence of thrombus. Normal compressibility, respiratory phasicity and response to augmentation. Axillary Vein: No evidence of thrombus. Normal compressibility, respiratory phasicity and response to augmentation. Cephalic Vein: No evidence of thrombus. Normal compressibility, respiratory phasicity and response to augmentation. Basilic Vein: No evidence of thrombus. Normal compressibility, respiratory phasicity and response to augmentation. Brachial Veins: No evidence of thrombus. Normal compressibility, respiratory phasicity and response to augmentation. Radial Veins: No evidence of thrombus. Normal compressibility, respiratory  phasicity and response to augmentation. Ulnar Veins: No evidence of thrombus. Normal compressibility, respiratory phasicity and response to augmentation. Venous Reflux:  None visualized. Other Findings:  None visualized. IMPRESSION: No evidence of DVT within the right upper extremity. Electronically Signed   By: Titus Dubin M.D.   On:  06/21/2019 05:25   Dg Chest Port 1 View  Result Date: 06/18/2019 CLINICAL DATA:  Acute respiratory failure EXAM: PORTABLE CHEST 1 VIEW COMPARISON:  06/15/2019 FINDINGS: Cardiac shadow is enlarged but stable. No pneumothorax is noted. Mild central vascular congestion is seen increased from the prior exam. No focal infiltrate is noted. No bony abnormality is seen. IMPRESSION: Mild vascular congestion. Electronically Signed   By: Inez Catalina M.D.   On: 06/18/2019 10:58   Dg Chest Port 1 View  Result Date: 06/15/2019 CLINICAL DATA:  Unsuccessful left IJ placement attempt, concern for pneumothorax EXAM: PORTABLE CHEST 1 VIEW COMPARISON:  06/15/2019 FINDINGS: No significant pneumothorax is appreciated. No vascular catheter or catheter fragment appreciated in the chest. Cardiomegaly with unchanged pulmonary vascular prominence and probable mild edema. IMPRESSION: No significant pneumothorax is appreciated. No vascular catheter or catheter fragment appreciated in the chest. Cardiomegaly with unchanged pulmonary vascular prominence and probable mild edema. Electronically Signed   By: Eddie Candle M.D.   On: 06/15/2019 21:51   Dg Chest Port 1 View  Result Date: 06/15/2019 CLINICAL DATA:  Shortness of breath. History of pulmonary hypertension. EXAM: PORTABLE CHEST 1 VIEW COMPARISON:  02/21/2019 FINDINGS: Cardiac enlargement and pulmonary vascular congestion. No pleural effusion or airspace opacities. Mild asymmetric elevation of right hemidiaphragm. IMPRESSION: Cardiac enlargement with pulmonary vascular congestion. Electronically Signed   By: Kerby Moors M.D.   On: 06/15/2019 14:06   Assessment/Plan The patient is a 53 year old male with a significant past medical history (see below) including chronic kidney disease who presented to the [facility] emergency department on June 15, 2019 with a chief complaint of weakness and shortness of breath.  Patient was found to be in severe acute  on chronic renal failure. 1.  Acute on chronic renal failure: The patient will need prolonged outpatient dialysis and at this point does not have adequate dialysis access to allow for this.  Recommend placement of a PermCath so the patient may dialyze in the outpatient setting.  Procedure, risks and benefits explained to the patient.  All questions answered.  The patient wishes to proceed.  We will be happy to see the patient in the outpatient setting to plan for more permanent dialysis access if his kidney function does not improve. 2.  Diabetes: On appropriate medications. Encouraged good control as its slows the progression of atherosclerotic / renal disease. 3. Hypertension: On appropriate medications. Encouraged good control as its slows the progression of atherosclerotic / renal disease.  Discussed with Dr. Mayme Genta, PA-C  06/22/2019 10:16 AM   This note was created with Dragon medical transcription system.  Any error is purely unintentional

## 2019-06-23 DIAGNOSIS — Z95828 Presence of other vascular implants and grafts: Secondary | ICD-10-CM

## 2019-06-23 LAB — CBC
HCT: 26.2 % — ABNORMAL LOW (ref 39.0–52.0)
Hemoglobin: 7.8 g/dL — ABNORMAL LOW (ref 13.0–17.0)
MCH: 23.6 pg — ABNORMAL LOW (ref 26.0–34.0)
MCHC: 29.8 g/dL — ABNORMAL LOW (ref 30.0–36.0)
MCV: 79.4 fL — ABNORMAL LOW (ref 80.0–100.0)
Platelets: 152 10*3/uL (ref 150–400)
RBC: 3.3 MIL/uL — ABNORMAL LOW (ref 4.22–5.81)
RDW: 19.6 % — ABNORMAL HIGH (ref 11.5–15.5)
WBC: 9.6 10*3/uL (ref 4.0–10.5)
nRBC: 0.7 % — ABNORMAL HIGH (ref 0.0–0.2)

## 2019-06-23 LAB — RENAL FUNCTION PANEL
Albumin: 2.1 g/dL — ABNORMAL LOW (ref 3.5–5.0)
Anion gap: 6 (ref 5–15)
BUN: 29 mg/dL — ABNORMAL HIGH (ref 6–20)
CO2: 29 mmol/L (ref 22–32)
Calcium: 8.4 mg/dL — ABNORMAL LOW (ref 8.9–10.3)
Chloride: 101 mmol/L (ref 98–111)
Creatinine, Ser: 5.73 mg/dL — ABNORMAL HIGH (ref 0.61–1.24)
GFR calc Af Amer: 12 mL/min — ABNORMAL LOW (ref >60)
GFR calc non Af Amer: 11 mL/min — ABNORMAL LOW (ref >60)
Glucose, Bld: 184 mg/dL — ABNORMAL HIGH (ref 70–99)
Phosphorus: 4 mg/dL (ref 2.5–4.6)
Potassium: 4.1 mmol/L (ref 3.5–5.1)
Sodium: 136 mmol/L (ref 135–145)

## 2019-06-23 LAB — GLUCOSE, CAPILLARY
Glucose-Capillary: 153 mg/dL — ABNORMAL HIGH (ref 70–99)
Glucose-Capillary: 195 mg/dL — ABNORMAL HIGH (ref 70–99)
Glucose-Capillary: 126 mg/dL — ABNORMAL HIGH (ref 70–99)
Glucose-Capillary: 221 mg/dL — ABNORMAL HIGH (ref 70–99)

## 2019-06-23 MED ORDER — CEFAZOLIN SODIUM-DEXTROSE 1-4 GM/50ML-% IV SOLN
1.0000 g | INTRAVENOUS | Status: DC
  Administered 2019-06-23 – 2019-06-24 (×2): 1 g via INTRAVENOUS
  Filled 2019-06-23 (×3): qty 50

## 2019-06-23 MED ORDER — RENA-VITE PO TABS
1.0000 | ORAL_TABLET | Freq: Every day | ORAL | Status: DC
  Administered 2019-06-23 – 2019-06-24 (×2): 1 via ORAL
  Filled 2019-06-23 (×2): qty 1

## 2019-06-23 MED ORDER — MORPHINE SULFATE (PF) 2 MG/ML IV SOLN
2.0000 mg | Freq: Once | INTRAVENOUS | Status: AC
  Administered 2019-06-23: 2 mg via INTRAVENOUS
  Filled 2019-06-23: qty 1

## 2019-06-23 MED ORDER — NEPRO/CARBSTEADY PO LIQD
237.0000 mL | Freq: Two times a day (BID) | ORAL | Status: DC
  Administered 2019-06-23 – 2019-06-25 (×4): 237 mL via ORAL

## 2019-06-23 MED ORDER — CHLORHEXIDINE GLUCONATE CLOTH 2 % EX PADS
6.0000 | MEDICATED_PAD | Freq: Every day | CUTANEOUS | Status: DC
  Administered 2019-06-23 – 2019-06-24 (×2): 6 via TOPICAL

## 2019-06-23 NOTE — Progress Notes (Signed)
[facility]  ROUNDING NOTE   Subjective:   First hemodialysis treatment yesterday through right tunneled permcath. Tolerated treatment well. Treatment was done in a chair.   Femoral temp catheter removed last night.   Objective:  Vital signs in last 24 hours:  Temp:  [97.9 F (36.6 C)-98.8 F (37.1 C)] 98.6 F (37 C) (07/07 1200) Pulse Rate:  [71-94] 76 (07/07 1200) Resp:  [8-28] 20 (07/07 1200) BP: (108-144)/(65-90) 108/73 (07/07 1200) SpO2:  [93 %-100 %] 100 % (07/07 1200) Weight:  [122.5 kg-126.7 kg] 126.7 kg (07/07 0500)  Weight change: -4.629 kg Filed Weights   06/22/19 0936 06/22/19 1535 06/23/19 0500  Weight: 122.5 kg 122.5 kg 126.7 kg    Intake/Output: I/O last 3 completed shifts: In: 250 [P.O.:240; I.V.:10] Out: 1489 [Other:1489]   Intake/Output this shift:  Total I/O In: 240 [P.O.:240] Out: 0   Physical Exam: General: NAD,   Head: Normocephalic, atraumatic. Moist oral mucosal membranes  Eyes: Anicteric, PERRL  Neck: Supple, trachea midline  Lungs:  Wheezes and crackles bilaterally.   Heart: Regular rate and rhythm  Abdomen:  Soft, nontender,   Extremities:  + peripheral edema.  Neurologic: Nonfocal, moving all four extremities  Skin: No lesions  Access: RIJ permcath 7/6    Basic Metabolic Panel: Recent Labs  Lab 06/20/19 2325 06/21/19 0211 06/21/19 0629 06/21/19 0947 06/21/19 1130 06/22/19 0440  NA 135 136 137 135 135 137  K 3.9 4.0 4.1 4.1 4.2 4.3  CL 102 103 104 102 101 103  CO2 27 27 28 28 25 27   GLUCOSE 154* 149* 138* 194* 179* 140*  BUN 15 15 14 14 16  25*  CREATININE 2.36* 2.43* 2.39* 2.43* 2.78* 4.54*  CALCIUM 8.3* 8.2* 8.5* 8.4* 8.4* 8.6*  MG 2.0 2.0 1.8 1.8  --  1.9  PHOS 2.7 2.7 2.8 2.7 2.9 3.8    Liver Function Tests: Recent Labs  Lab 06/21/19 0211 06/21/19 0629 06/21/19 0947 06/21/19 1130 06/22/19 0440  ALBUMIN 2.0* 2.0* 2.1* 2.1* 2.1*   No results for input(s): LIPASE, AMYLASE in the last 168  hours. No results for input(s): AMMONIA in the last 168 hours.  CBC: Recent Labs  Lab 06/17/19 0526 06/18/19 0420 06/19/19 0558 06/20/19 0549  WBC 11.8* 14.4* 9.3 8.2  NEUTROABS  --   --  6.6 5.2  HGB 9.6* 9.5* 8.8* 8.5*  HCT 32.0* 31.9* 28.8* 28.2*  MCV 78.8* 79.0* 78.5* 79.4*  PLT 201 200 156 137*    Cardiac Enzymes: No results for input(s): CKTOTAL, CKMB, CKMBINDEX, TROPONINI in the last 168 hours.  BNP: Invalid input(s): POCBNP  CBG: Recent Labs  Lab 06/22/19 1153 06/22/19 1854 06/22/19 2254 06/23/19 0746 06/23/19 1158  GLUCAP 130* 131* 172* 153* 195*    Microbiology: Results for orders placed or performed during the hospital encounter of 06/15/19  Blood culture (routine x 2)     Status: None   Collection Time: 06/15/19  1:48 PM   Specimen: BLOOD  Result Value Ref Range Status   Specimen Description BLOOD BLOOD LEFT ARM  Final   Special Requests   Final    BOTTLES DRAWN AEROBIC AND ANAEROBIC Blood Culture adequate volume   Culture   Final    NO GROWTH 5 DAYS Performed at [facility], [address] [place], [place] [address]    Report Status 06/20/2019 FINAL  Final  SARS Coronavirus 2 (CEPHEID- Performed in [place] hospital lab), Hosp Order     Status: None  Collection Time: 06/15/19  1:57 PM   Specimen: Nasopharyngeal Swab  Result Value Ref Range Status   SARS Coronavirus 2 NEGATIVE NEGATIVE Final    Comment: (NOTE) If result is NEGATIVE SARS-CoV-2 target nucleic acids are NOT DETECTED. The SARS-CoV-2 RNA is generally detectable in upper and lower  respiratory specimens during the acute phase of infection. The lowest  concentration of SARS-CoV-2 viral copies this assay can detect is 250  copies / mL. A negative result does not preclude SARS-CoV-2 infection  and should not be used as the sole basis for treatment or other  patient management decisions.  A negative result may occur with  improper specimen collection / handling,  submission of specimen other  than nasopharyngeal swab, presence of viral mutation(s) within the  areas targeted by this assay, and inadequate number of viral copies  (<250 copies / mL). A negative result must be combined with clinical  observations, patient history, and epidemiological information. If result is POSITIVE SARS-CoV-2 target nucleic acids are DETECTED. The SARS-CoV-2 RNA is generally detectable in upper and lower  respiratory specimens dur ing the acute phase of infection.  Positive  results are indicative of active infection with SARS-CoV-2.  Clinical  correlation with patient history and other diagnostic information is  necessary to determine patient infection status.  Positive results do  not rule out bacterial infection or co-infection with other viruses. If result is PRESUMPTIVE POSTIVE SARS-CoV-2 nucleic acids MAY BE PRESENT.   A presumptive positive result was obtained on the submitted specimen  and confirmed on repeat testing.  While 2019 novel coronavirus  (SARS-CoV-2) nucleic acids may be present in the submitted sample  additional confirmatory testing may be necessary for epidemiological  and / or clinical management purposes  to differentiate between  SARS-CoV-2 and other Sarbecovirus currently known to infect humans.  If clinically indicated additional testing with an alternate test  methodology [phone]) is advised. The SARS-CoV-2 RNA is generally  detectable in upper and lower respiratory sp ecimens during the acute  phase of infection. The expected result is Negative. Fact Sheet for Patients:  StrictlyIdeas.no Fact Sheet for Healthcare Providers: BankingDealers.co.za This test is not yet approved or cleared by the [place] FDA and has been authorized for detection and/or diagnosis of SARS-CoV-2 by FDA under an Emergency Use Authorization (EUA).  This EUA will remain in effect (meaning this test can be  used) for the duration of the COVID-19 declaration under Section 564(b)(1) of the Act, 21 U.S.C. section 360bbb-3(b)(1), unless the authorization is terminated or revoked sooner. Performed at [facility], [address] [place], [place] [address]   Blood culture (routine x 2)     Status: None   Collection Time: 06/15/19  4:40 PM   Specimen: BLOOD  Result Value Ref Range Status   Specimen Description BLOOD BLOOD RIGHT ARM  Final   Special Requests   Final    BOTTLES DRAWN AEROBIC AND ANAEROBIC Blood Culture adequate volume   Culture   Final    NO GROWTH 5 DAYS Performed at [facility], [address], [place], [place] [address]    Report Status 06/20/2019 FINAL  Final  MRSA PCR Screening     Status: None   Collection Time: 06/22/19  4:40 AM   Specimen: Nasal Mucosa; Nasopharyngeal  Result Value Ref Range Status   MRSA by PCR NEGATIVE NEGATIVE Final    Comment:        The GeneXpert MRSA Assay (FDA approved for NASAL  specimens only), is one component of a comprehensive MRSA colonization surveillance program. It is not intended to diagnose MRSA infection nor to guide or monitor treatment for MRSA infections. Performed at [facility], [address] [place], [place] [address]     Coagulation Studies: No results for input(s): LABPROT, INR in the last 72 hours.  Urinalysis: No results for input(s): COLORURINE, LABSPEC, PHURINE, GLUCOSEU, HGBUR, BILIRUBINUR, KETONESUR, PROTEINUR, UROBILINOGEN, NITRITE, LEUKOCYTESUR in the last 72 hours.  Invalid input(s): APPERANCEUR    Imaging: No results found.   Medications:   .  ceFAZolin (ANCEF) IV     . amLODipine  5 mg Oral Daily  . atorvastatin  40 mg Oral QHS  . carvedilol  25 mg Oral Q12H  . chlorhexidine  15 mL Mouth Rinse BID  . Chlorhexidine Gluconate Cloth  6 each Topical Daily  . cholecalciferol  2,000 Units Oral Daily  . epoetin (EPOGEN/PROCRIT) injection  10,000 Units Intravenous  Q T,Th,Sa-HD  . feeding supplement (NEPRO CARB STEADY)  237 mL Oral BID BM  . heparin  5,000 Units Subcutaneous Q8H  . hydrALAZINE  25 mg Oral Q8H  . hydrocerin   Topical TID  . insulin aspart  0-15 Units Subcutaneous TID WC  . insulin aspart  0-5 Units Subcutaneous QHS  . isosorbide mononitrate  60 mg Oral Daily  . mouth rinse  15 mL Mouth Rinse q12n4p  . multivitamin  1 tablet Oral QHS  . sodium chloride flush  10 mL Intracatheter Q12H  . umeclidinium-vilanterol  1 puff Inhalation Daily   acetaminophen **OR** acetaminophen, albuterol, alum & mag hydroxide-simeth, bisacodyl, ondansetron **OR** ondansetron (ZOFRAN) IV, oxyCODONE-acetaminophen, senna-docusate, sodium chloride flush, traZODone  Assessment/ Plan:  [name] is a 53 y.o. black male with Bell's palsy, congestive heart failure, diabetes mellitus type 2, hypertension, obstructive sleep apnea, psoriasis, pulmonary hypertension who was admitted to [facility] on6/29/2020for evaluation of severe acute renal failure in the setting of generalized weakness. Placed on CRRT on 6/29 to 7/5. First intermittent hemodialysis treatment was 7/6. Permcath placed 7/6.  1. 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 Admission creatinine of 16.23.  Acute renal failure secondary to prerenal azotemia leading to ATN from dehydration/hypovolemia Chronic kidney disease secondary to diabetic nephropathy. Strong family history with brother with ESRD on hemodialysis.   - Dialysis for three treatments consecutively. Second treatment for later today.  - Outpatient planning for [facility]   2. Hypertension with volume overload. Echocardiogram with preserved systolic function.  Currently on amlodipine, carvedilol, isosorbide mononitrate and hydralazine.  Home regimen includes furosemide.   3. Anemia of chronic kidney disease: hemoglobin of 8.5, microcytic.  - EPO with dialysis  treatment.    LOS: 8 Sarath Kolluru 7/7/20201:15 PM

## 2019-06-23 NOTE — Progress Notes (Signed)
 Vein & Vascular Surgery Daily Progress Note   Subjective: 1 Day Post-Op: 1. Ultrasound guidance for vascular access to the right internal jugular vein 2. Fluoroscopic guidance for placement of catheter 3. Placement of a 19 cm tip to cuff tunneled hemodialysis catheter via the right internal jugular vein  Patient without complaint. No issues with permcath / permcath site last night.   Objective: Vitals:   06/22/19 2256 06/22/19 2315 06/23/19 0500 06/23/19 0512  BP: 133/86   129/90  Pulse:  82  73  Resp:  18  20  Temp:    98.3 F (36.8 C)  TempSrc:    Oral  SpO2:  97%  100%  Weight:   126.7 kg   Height:        Intake/Output Summary (Last 24 hours) at 06/23/2019 1123 Last data filed at 06/23/2019 0900 Gross per 24 hour  Intake 490 ml  Output 1489 ml  Net -999 ml   Physical Exam: A&Ox3, NAD CV: RRR Pulmonary: CTA Bilaterally Abdomen: Soft, Nontender, Nondistended Vascular:  Right Permcath: Intact. Site is clean and dry. No swelling or drainage noted.   Laboratory: CBC    Component Value Date/Time   WBC 8.2 06/20/2019 0549   HGB 8.5 (L) 06/20/2019 0549   HGB 10.8 (L) 04/21/2019 1334   HCT 28.2 (L) 06/20/2019 0549   HCT 36.1 (L) 04/21/2019 1334   PLT 137 (L) 06/20/2019 0549   PLT 301 04/21/2019 1334   BMET    Component Value Date/Time   NA 137 06/22/2019 0440   NA 142 04/21/2019 1334   K 4.3 06/22/2019 0440   CL 103 06/22/2019 0440   CO2 27 06/22/2019 0440   GLUCOSE 140 (H) 06/22/2019 0440   BUN 25 (H) 06/22/2019 0440   BUN 21 04/21/2019 1334   CREATININE 4.54 (H) 06/22/2019 0440   CALCIUM 8.6 (L) 06/22/2019 0440   GFRNONAA 14 (L) 06/22/2019 0440   GFRAA 16 (L) 06/22/2019 0440   Assessment/Planning: The patient is a 53 year old male with a significant past medical history (see below) including chronic kidney disease who presented to the [facility] emergency department on June 15, 2019 with a chief complaint of weakness and  shortness of breath.  Patient was found to be in severe acute on chronic renal failure. 1) Permcath insertion POD #1 2) Permcath functioning well during dialysis 3) Will sign off at this time  Discussed with Dr. Ellis [facility] PA-C 06/23/2019 11:23 AM

## 2019-06-23 NOTE — Progress Notes (Signed)
Following this patient for hemodialysis outpatient placement.  Elvera Bicker Dialysis Coordinator  [phone]

## 2019-06-23 NOTE — Consult Note (Signed)
[facility] for Electrolyte Monitoring and Replacement   Recent Labs: Potassium (mmol/L)  Date Value  06/23/2019 4.1   Magnesium (mg/dL)  Date Value  06/22/2019 1.9   Calcium (mg/dL)  Date Value  06/23/2019 8.4 (L)   Albumin (g/dL)  Date Value  06/23/2019 2.1 (L)  04/21/2019 4.1   Phosphorus (mg/dL)  Date Value  06/23/2019 4.0   Sodium (mmol/L)  Date Value  06/23/2019 136  04/21/2019 142     Assessment: 53 y.o. male admitted on 06/15/2019 with hyperkalemia. He remains anuric following CRRT and is now being transitioned to HD following PermCath placement today  Goal of Therapy:  Electrolytes WNL  Plan:   All electrolytes currently wnl: no replacement required  As pt is out of the ICU and electrolytes have been WNL for the last few days, will sign off at this time  Rocky Morel ,PharmD Clinical Pharmacist 06/23/2019 3:07 PM

## 2019-06-23 NOTE — Progress Notes (Signed)
Patient ID: [name], male   DOB: 05/06/1966, 53 y.o.   MRN: [id number]  [employer] PROGRESS NOTE  [name] CHY:[id number] DOB: Jun 10, 1966 DOA: 06/15/2019 PCP: Lavera Guise, MD  HPI/Subjective: Patient still having some pain in his left lower extremity.  Feeling a little bit better with regards to his breathing.  Objective: Vitals:   06/23/19 1600 06/23/19 1615  BP: (!) 142/81 (!) 142/85  Pulse: 75 70  Resp: 16 16  Temp:    SpO2: 100% 100%    Filed Weights   06/22/19 0936 06/22/19 1535 06/23/19 0500  Weight: 122.5 kg 122.5 kg 126.7 kg    ROS: Review of Systems  Constitutional: Negative for chills and fever.  Eyes: Negative for blurred vision.  Respiratory: Negative for cough and shortness of breath.   Cardiovascular: Negative for chest pain.  Gastrointestinal: Negative for abdominal pain, constipation, diarrhea, nausea and vomiting.  Genitourinary: Negative for dysuria.  Musculoskeletal: Positive for joint pain.  Neurological: Negative for dizziness and headaches.   Exam: Physical Exam  Constitutional: He is oriented to person, place, and time.  HENT:  Nose: No mucosal edema.  Mouth/Throat: No oropharyngeal exudate or posterior oropharyngeal edema.  Eyes: Pupils are equal, round, and reactive to light. Conjunctivae, EOM and lids are normal.  Neck: No JVD present. Carotid bruit is not present. No edema present. No thyroid mass and no thyromegaly present.  Cardiovascular: S1 normal and S2 normal. Exam reveals no gallop.  No murmur heard. Pulses:      Dorsalis pedis pulses are 2+ on the right side and 2+ on the left side.  Respiratory: No respiratory distress. He has decreased breath sounds in the right lower field and the left lower field. He has no wheezes. He has no rhonchi. He has no rales.  GI: Soft. Bowel sounds are normal. There is no abdominal tenderness.  Musculoskeletal:     Right ankle: He exhibits swelling.     Left ankle: He  exhibits swelling.  Lymphadenopathy:    He has no cervical adenopathy.  Neurological: He is alert and oriented to person, place, and time. No cranial nerve deficit.  Skin: Skin is warm. Nails show no clubbing.  Chronic lower extremity discoloration bilateral lower extremities.  Left leg laterally and anteriorly red and warm and tender to palpation.  The redness is a little decreased from yesterday in intensity.  Psychiatric: He has a normal mood and affect.      Data Reviewed: Basic Metabolic Panel: Recent Labs  Lab 06/20/19 2325 06/21/19 0211 06/21/19 0629 06/21/19 0947 06/21/19 1130 06/22/19 0440 06/23/19 0000  NA 135 136 137 135 135 137 136  K 3.9 4.0 4.1 4.1 4.2 4.3 4.1  CL 102 103 104 102 101 103 101  CO2 27 27 28 28 25 27 29   GLUCOSE 154* 149* 138* 194* 179* 140* 184*  BUN 15 15 14 14 16  25* 29*  CREATININE 2.36* 2.43* 2.39* 2.43* 2.78* 4.54* 5.73*  CALCIUM 8.3* 8.2* 8.5* 8.4* 8.4* 8.6* 8.4*  MG 2.0 2.0 1.8 1.8  --  1.9  --   PHOS 2.7 2.7 2.8 2.7 2.9 3.8 4.0   Liver Function Tests: Recent Labs  Lab 06/21/19 0629 06/21/19 0947 06/21/19 1130 06/22/19 0440 06/23/19 0000  ALBUMIN 2.0* 2.1* 2.1* 2.1* 2.1*   CBC: Recent Labs  Lab 06/17/19 0526 06/18/19 0420 06/19/19 0558 06/20/19 0549 06/23/19 1432  WBC 11.8* 14.4* 9.3 8.2 9.6  NEUTROABS  --   --  6.6  5.2  --   HGB 9.6* 9.5* 8.8* 8.5* 7.8*  HCT 32.0* 31.9* 28.8* 28.2* 26.2*  MCV 78.8* 79.0* 78.5* 79.4* 79.4*  PLT 201 200 156 137* 152   BNP (last 3 results) Recent Labs    02/18/19 1253 06/15/19 1348  BNP 165.0* 173.0*    CBG: Recent Labs  Lab 06/22/19 1153 06/22/19 1854 06/22/19 2254 06/23/19 0746 06/23/19 1158  GLUCAP 130* 131* 172* 153* 195*    Recent Results (from the past 240 hour(s))  Blood culture (routine x 2)     Status: None   Collection Time: 06/15/19  1:48 PM   Specimen: BLOOD  Result Value Ref Range Status   Specimen Description BLOOD BLOOD LEFT ARM  Final   Special  Requests   Final    BOTTLES DRAWN AEROBIC AND ANAEROBIC Blood Culture adequate volume   Culture   Final    NO GROWTH 5 DAYS Performed at [facility], [address] [place], [place] [address]    Report Status 06/20/2019 FINAL  Final  SARS Coronavirus 2 (CEPHEID- Performed in [place] hospital lab), Hosp Order     Status: None   Collection Time: 06/15/19  1:57 PM   Specimen: Nasopharyngeal Swab  Result Value Ref Range Status   SARS Coronavirus 2 NEGATIVE NEGATIVE Final    Comment: (NOTE) If result is NEGATIVE SARS-CoV-2 target nucleic acids are NOT DETECTED. The SARS-CoV-2 RNA is generally detectable in upper and lower  respiratory specimens during the acute phase of infection. The lowest  concentration of SARS-CoV-2 viral copies this assay can detect is 250  copies / mL. A negative result does not preclude SARS-CoV-2 infection  and should not be used as the sole basis for treatment or other  patient management decisions.  A negative result may occur with  improper specimen collection / handling, submission of specimen other  than nasopharyngeal swab, presence of viral mutation(s) within the  areas targeted by this assay, and inadequate number of viral copies  (<250 copies / mL). A negative result must be combined with clinical  observations, patient history, and epidemiological information. If result is POSITIVE SARS-CoV-2 target nucleic acids are DETECTED. The SARS-CoV-2 RNA is generally detectable in upper and lower  respiratory specimens dur ing the acute phase of infection.  Positive  results are indicative of active infection with SARS-CoV-2.  Clinical  correlation with patient history and other diagnostic information is  necessary to determine patient infection status.  Positive results do  not rule out bacterial infection or co-infection with other viruses. If result is PRESUMPTIVE POSTIVE SARS-CoV-2 nucleic acids MAY BE PRESENT.   A presumptive positive  result was obtained on the submitted specimen  and confirmed on repeat testing.  While 2019 novel coronavirus  (SARS-CoV-2) nucleic acids may be present in the submitted sample  additional confirmatory testing may be necessary for epidemiological  and / or clinical management purposes  to differentiate between  SARS-CoV-2 and other Sarbecovirus currently known to infect humans.  If clinically indicated additional testing with an alternate test  methodology [phone]) is advised. The SARS-CoV-2 RNA is generally  detectable in upper and lower respiratory sp ecimens during the acute  phase of infection. The expected result is Negative. Fact Sheet for Patients:  StrictlyIdeas.no Fact Sheet for Healthcare Providers: BankingDealers.co.za This test is not yet approved or cleared by the [place] FDA and has been authorized for detection and/or diagnosis of SARS-CoV-2 by FDA under an Emergency Use Authorization (EUA).  This EUA will remain in effect (meaning this test can be used) for the duration of the COVID-19 declaration under Section 564(b)(1) of the Act, 21 U.S.C. section 360bbb-3(b)(1), unless the authorization is terminated or revoked sooner. Performed at [facility], [address] [place], [place] [address]   Blood culture (routine x 2)     Status: None   Collection Time: 06/15/19  4:40 PM   Specimen: BLOOD  Result Value Ref Range Status   Specimen Description BLOOD BLOOD RIGHT ARM  Final   Special Requests   Final    BOTTLES DRAWN AEROBIC AND ANAEROBIC Blood Culture adequate volume   Culture   Final    NO GROWTH 5 DAYS Performed at [facility], [address] [place], [place] [address]    Report Status 06/20/2019 FINAL  Final  MRSA PCR Screening     Status: None   Collection Time: 06/22/19  4:40 AM   Specimen: Nasal Mucosa; Nasopharyngeal  Result Value Ref Range Status   MRSA by PCR NEGATIVE NEGATIVE  Final    Comment:        The GeneXpert MRSA Assay (FDA approved for NASAL specimens only), is one component of a comprehensive MRSA colonization surveillance program. It is not intended to diagnose MRSA infection nor to guide or monitor treatment for MRSA infections. Performed at [facility], [address] [place], [place] [address]       Scheduled Meds: . amLODipine  5 mg Oral Daily  . atorvastatin  40 mg Oral QHS  . carvedilol  25 mg Oral Q12H  . chlorhexidine  15 mL Mouth Rinse BID  . Chlorhexidine Gluconate Cloth  6 each Topical Daily  . cholecalciferol  2,000 Units Oral Daily  . epoetin (EPOGEN/PROCRIT) injection  10,000 Units Intravenous Q T,Th,Sa-HD  . feeding supplement (NEPRO CARB STEADY)  237 mL Oral BID BM  . heparin  5,000 Units Subcutaneous Q8H  . hydrALAZINE  25 mg Oral Q8H  . hydrocerin   Topical TID  . insulin aspart  0-15 Units Subcutaneous TID WC  . insulin aspart  0-5 Units Subcutaneous QHS  . isosorbide mononitrate  60 mg Oral Daily  . mouth rinse  15 mL Mouth Rinse q12n4p  . multivitamin  1 tablet Oral QHS  . sodium chloride flush  10 mL Intracatheter Q12H  . umeclidinium-vilanterol  1 puff Inhalation Daily   Continuous Infusions: .  ceFAZolin (ANCEF) IV      Assessment/Plan:  1. Acute on chronic kidney injury.  Progressed to end-stage renal disease.  Patient had PermCath placed yesterday.  Second dialysis session today.  Will receive third dialysis session tomorrow.  Patient will go to [facility] on Tuesday Thursday Saturday.  They are still working on a time. 2. Acute on chronic respiratory failure secondary to severe COPD.  Patient on baseline chronic 3 L of oxygen.  Patient is a candidate for noninvasive ventilation at night. 3. Acute on chronic diastolic congestive heart failure.  Dialysis now to manage fluid.  Patient on low-dose Coreg.  Also on hydralazine and Imdur. 4. Left lower extremity cellulitis on Ancef.  Spoke with  pharmacist to change to IV with dialysis for today and tomorrow.  Likely can go over to Keflex upon discharge. 5. Hyperkalemia treated with dialysis, 6. Essential hypertension on Coreg, Norvasc, indoor and hydralazine 7. Hyperlipidemia unspecified on atorvastatin 8. Type 2 diabetes mellitus.  All sugars have been good.  Patient on sliding scale.  Even though  hemoglobin A1c elevated at 14.7.  Code Status:     Code Status Orders  (From admission, onward)         Start     Ordered   06/15/19 1831  Full code  Continuous     06/15/19 1830        Code Status History    Date Active Date Inactive Code Status Order ID Comments User Context   02/18/2019 1814 02/24/2019 1854 Full Code [id number]  Velna Ochs, MD ED   01/09/2018 1753 01/14/2018 1847 Full Code [id number]  Epifanio Lesches, MD ED   11/20/2016 2148 11/29/2016 1941 Full Code [id number]  Vaughan Basta, MD Inpatient   Advance Care Planning Activity     Disposition Plan: We will need outpatient dialysis slot.  Consultants:  Nephrology  Time spent: 28 minutes.  Spoke with the patient's friend on the phone and gave an update.  He was able to give me the patient's brother's phone number Reggie at [phone].  I left a message for the brother.  Richard [employer]

## 2019-06-23 NOTE — Progress Notes (Signed)
HD initiated via R chest HD catheter without issue. Dressing changed. HD procedures discussed with patient. All questions answered. No current complaints. Continue to monitor.

## 2019-06-23 NOTE — Evaluation (Signed)
Physical Therapy Evaluation Patient Details Name: [name] MRN: [id number] DOB: 16-Oct-1966 Today's Date: 06/23/2019   History of Present Illness  Patient is a pleasant 53 year old male admitted for hyperkalemia. PMH includes DM2, chronic respiratory failure on 3L 02, CHF, CKD, HTn, OSA, Bell's Palsy, pulmonary HTN, NSVT, and psoriasis. Patient's femoral catheter removed on 7/6 with placement of hemodialysis catheter in R internal jugular.  Clinical Impression  Patient is a pleasant 53 year old male who presents with limited strength and stability. Patient's evaluation was limited by severe neck pain (9/10-10/10) with mobility; due to this full placement options will require trial period of 3-5 sessions to evaluate full needs of patient. Patient required increased oxygen (from 3L to 4L) due to desaturation of Sp02 from 95 resting to 83% sitting EOB. Patient transferred supine to sit and sit to stand with modified independence and Sp02>90 on 4L of oxygen but was unable to ambulate due to severe neck pain causing patient to defer further mobility. Patient will benefit from skilled physical therapy to increase stability, strength, and mobility to return to PLOF. Upon discharge patient will potentially benefit from HHPT and intermittent supervision pending a 3-5 trial session as described above.     Follow Up Recommendations Home health PT;Supervision - Intermittent(Pending patient's participation with physical therapy next 3-5 sessions)    Equipment Recommendations  3in1 (PT)    Recommendations for Other Services OT consult     Precautions / Restrictions Precautions Precautions: Fall Restrictions Weight Bearing Restrictions: No      Mobility  Bed Mobility Overal bed mobility: Modified Independent             General bed mobility comments: Patient able to transfer supine <>sit with Mod I for extra time due to cervical pain.  Transfers Overall transfer level: Modified  independent Equipment used: Rolling walker (2 wheeled)             General transfer comment: Patient transfers STS from bed with supervision/Mod I with good understanding of hand placement and safety awareness.  Ambulation/Gait             General Gait Details: Patient declined gait due to having 10/10 pain in cervical region with attempt at static marching  Stairs            Wheelchair Mobility    Modified Rankin (Stroke Patients Only)       Balance Overall balance assessment: Needs assistance Sitting-balance support: Feet supported Sitting balance-Leahy Scale: Fair Sitting balance - Comments: able to reach inside and outside BOS to apply cream to LE's without LOB   Standing balance support: Single extremity supported Standing balance-Leahy Scale: Fair Standing balance comment: Requires SUE support for static stability in standing, unable to perform dynamic stability due to pain in neck.                             Pertinent Vitals/Pain Pain Assessment: 0-10 Pain Score: 9  Pain Location: neck: raises to 10/10 with attempt at walking Pain Descriptors / Indicators: Constant;Pressure;Stabbing Pain Intervention(s): Limited activity within patient's tolerance;Repositioned;Monitored during session    [place] expects to be discharged to:: Private residence Living Arrangements: Alone Available Help at Discharge: Family;Friend(s) Type of Home: Apartment Home Access: Level entry     Home Layout: One level Home Equipment: [place] - 4 wheels;Walker - 2 wheels;Grab bars - tub/shower Additional Comments: Uses 3 L of 02 at rest.  Prior Function Level of Independence: Independent with assistive device(s)         Comments: Patient reports he walks around the house without an AD but uses the rollator for when he is at the grocery store.     Hand Dominance        Extremity/Trunk Assessment   Upper Extremity Assessment Upper  Extremity Assessment: Defer to OT evaluation    Lower Extremity Assessment Lower Extremity Assessment: Generalized weakness(grossly 4-/5 bilaterally. sensation limited by excessive edema)       Communication   Communication: No difficulties  Cognition Arousal/Alertness: Awake/alert Behavior During Therapy: WFL for tasks assessed/performed Overall Cognitive Status: Within Functional Limits for tasks assessed                                 General Comments: Patient is a A and Ox4      General Comments General comments (skin integrity, edema, etc.): bilateral LE edema with noted skin tearing/bleeding    Exercises Total Joint Exercises Ankle Circles/Pumps: AROM;Both;15 reps;Seated;Supine Other Exercises Other Exercises: exercise technique and transfer stability to decrease falls risk. Other Exercises: seated EOB stability, reaching inside/outside BOS, applying lotion to LE's for reduction of pain in LE's.   Assessment/Plan    PT Assessment Patient needs continued PT services  PT Problem List Decreased strength;Decreased activity tolerance;Decreased balance;Decreased coordination;Decreased mobility;Obesity;Pain;Decreased skin integrity       PT Treatment Interventions DME instruction;Gait training;Functional mobility training;Neuromuscular re-education;Balance training;Therapeutic exercise;Therapeutic activities;Patient/family education;Manual techniques    PT Goals (Current goals can be found in the Care Plan section)  Acute Rehab PT Goals Patient Stated Goal: to return home and have less pain PT Goal Formulation: With patient Time For Goal Achievement: 07/07/19 Potential to Achieve Goals: Fair    Frequency Min 2X/week   Barriers to discharge Decreased caregiver support patient will need intermittent supervision/assistance    Co-evaluation               AM-PAC PT "6 Clicks" Mobility  Outcome Measure Help needed turning from your back to your side  while in a flat bed without using bedrails?: A Little Help needed moving from lying on your back to sitting on the side of a flat bed without using bedrails?: A Little Help needed moving to and from a bed to a chair (including a wheelchair)?: A Little Help needed standing up from a chair using your arms (e.g., wheelchair or bedside chair)?: A Little Help needed to walk in hospital room?: A Lot Help needed climbing 3-5 steps with a railing? : A Lot 6 Click Score: 16    End of Session Equipment Utilized During Treatment: Gait belt;Other (comment);Oxygen(oxygen 3 L rest, 4 L activity via nasal cannula) Activity Tolerance: Patient limited by pain(cervical pain) Patient left: in bed;with call bell/phone within reach;with bed alarm set Nurse Communication: Mobility status(need to increase oxygen to 4L with OOB due to Sp02 dropping to 83%) PT Visit Diagnosis: Unsteadiness on feet (R26.81);Other abnormalities of gait and mobility (R26.89);Muscle weakness (generalized) (M62.81);Difficulty in walking, not elsewhere classified (R26.2);Pain Pain - part of body: (cervical)    Time: 9163-8466 PT Time Calculation (min) (ACUTE ONLY): 25 min   Charges:   PT Evaluation $PT Eval Moderate Complexity: 1 Mod PT Treatments $Therapeutic Activity: 8-22 mins       Janna Arch, PT, DPT    Janna Arch 06/23/2019, 10:59 AM

## 2019-06-24 ENCOUNTER — Other Ambulatory Visit: Payer: Self-pay | Admitting: Internal Medicine

## 2019-06-24 LAB — HEPATITIS B CORE ANTIBODY, IGM: Hep B C IgM: NEGATIVE

## 2019-06-24 LAB — RENAL FUNCTION PANEL
Albumin: 2.1 g/dL — ABNORMAL LOW (ref 3.5–5.0)
Anion gap: 6 (ref 5–15)
BUN: 21 mg/dL — ABNORMAL HIGH (ref 6–20)
CO2: 30 mmol/L (ref 22–32)
Calcium: 8.3 mg/dL — ABNORMAL LOW (ref 8.9–10.3)
Chloride: 101 mmol/L (ref 98–111)
Creatinine, Ser: 4.64 mg/dL — ABNORMAL HIGH (ref 0.61–1.24)
GFR calc Af Amer: 16 mL/min — ABNORMAL LOW (ref >60)
GFR calc non Af Amer: 14 mL/min — ABNORMAL LOW (ref >60)
Glucose, Bld: 189 mg/dL — ABNORMAL HIGH (ref 70–99)
Phosphorus: 3.5 mg/dL (ref 2.5–4.6)
Potassium: 3.7 mmol/L (ref 3.5–5.1)
Sodium: 137 mmol/L (ref 135–145)

## 2019-06-24 LAB — CBC
HCT: 26.7 % — ABNORMAL LOW (ref 39.0–52.0)
Hemoglobin: 8 g/dL — ABNORMAL LOW (ref 13.0–17.0)
MCH: 23.7 pg — ABNORMAL LOW (ref 26.0–34.0)
MCHC: 30 g/dL (ref 30.0–36.0)
MCV: 79 fL — ABNORMAL LOW (ref 80.0–100.0)
Platelets: 156 10*3/uL (ref 150–400)
RBC: 3.38 MIL/uL — ABNORMAL LOW (ref 4.22–5.81)
RDW: 19.6 % — ABNORMAL HIGH (ref 11.5–15.5)
WBC: 9.6 10*3/uL (ref 4.0–10.5)
nRBC: 0.9 % — ABNORMAL HIGH (ref 0.0–0.2)

## 2019-06-24 LAB — GLUCOSE, CAPILLARY
Glucose-Capillary: 169 mg/dL — ABNORMAL HIGH (ref 70–99)
Glucose-Capillary: 201 mg/dL — ABNORMAL HIGH (ref 70–99)
Glucose-Capillary: 139 mg/dL — ABNORMAL HIGH (ref 70–99)
Glucose-Capillary: 247 mg/dL — ABNORMAL HIGH (ref 70–99)

## 2019-06-24 LAB — HEPATITIS B SURFACE ANTIGEN: Hepatitis B Surface Ag: NEGATIVE

## 2019-06-24 LAB — HEPATITIS B SURFACE ANTIBODY, QUANTITATIVE: Hep B S AB Quant (Post): 7.1 m[IU]/mL — ABNORMAL LOW (ref >9.9)

## 2019-06-24 MED ORDER — POLYETHYLENE GLYCOL 3350 17 G PO PACK
17.0000 g | PACK | Freq: Once | ORAL | Status: AC
  Administered 2019-06-24: 17 g via ORAL
  Filled 2019-06-24: qty 1

## 2019-06-24 MED ORDER — OMEPRAZOLE 20 MG PO CPDR: 20.0000 mg | DELAYED_RELEASE_CAPSULE | Freq: Two times a day (BID) | ORAL | 2 refills | Status: DC

## 2019-06-24 MED ORDER — TRULICITY 1.5 MG/0.5ML ~~LOC~~ SOAJ: SUBCUTANEOUS | 1 refills | Status: DC

## 2019-06-24 MED ORDER — SENNA 8.6 MG PO TABS
1.0000 | ORAL_TABLET | Freq: Every day | ORAL | Status: AC
  Administered 2019-06-24: 8.6 mg via ORAL
  Filled 2019-06-24: qty 1

## 2019-06-24 MED ORDER — MORPHINE SULFATE (PF) 2 MG/ML IV SOLN
2.0000 mg | Freq: Once | INTRAVENOUS | Status: AC
  Administered 2019-06-24: 2 mg via INTRAVENOUS
  Filled 2019-06-24: qty 1

## 2019-06-24 NOTE — Progress Notes (Deleted)
HD Tx completed. Pt without complaint of pain or discomfort. Vitals stable.    06/24/19 1730  Vital Signs  Pulse Rate 79  Resp 15  BP 132/81  Oxygen Therapy  SpO2 99 %

## 2019-06-24 NOTE — Progress Notes (Signed)
HD Tx completed:   06/24/19 1730  Vital Signs  Pulse Rate 79  Resp 15  BP 132/81  Oxygen Therapy  SpO2 99 %  During Hemodialysis Assessment  Blood Flow Rate (mL/min) 400 mL/min  Arterial Pressure (mmHg) -190 mmHg  Venous Pressure (mmHg) 160 mmHg  Transmembrane Pressure (mmHg) 40 mmHg  Ultrafiltration Rate (mL/min) 1160 mL/min  Dialysate Flow Rate (mL/min) 600 ml/min  Conductivity: Machine  13.9  HD Safety Checks Performed Yes  Intra-Hemodialysis Comments Tx completed

## 2019-06-24 NOTE — Progress Notes (Signed)
Post HD Assessment:   06/24/19 1730  Neurological  Level of Consciousness Alert  Orientation Level Oriented X4  Respiratory  Respiratory Pattern Regular;Unlabored  Chest Assessment Chest expansion symmetrical  R Upper  Breath Sounds Clear  L Upper Breath Sounds Fine crackles  R Lower Breath Sounds Coarse crackles  L Lower Breath Sounds Clear  Cardiac  Pulse Regular  Heart Sounds S1, S2  ECG Monitor Yes  Cardiac Rhythm NSR  Vascular  R Radial Pulse +2  L Radial Pulse +2  RLE Edema Non-pitting  LLE Edema Non-Pitting  Psychosocial  Psychosocial (WDL) WDL

## 2019-06-24 NOTE — Plan of Care (Signed)
  Problem: Education: Goal: Knowledge of General Education information will improve Description: Including pain rating scale, medication(s)/side effects and non-pharmacologic comfort measures Outcome: Progressing   Problem: Health Behavior/Discharge Planning: Goal: Ability to manage health-related needs will improve Outcome: Progressing   Problem: Clinical Measurements: Goal: Ability to maintain clinical measurements within normal limits will improve Outcome: Progressing Goal: Diagnostic test results will improve Outcome: Progressing Goal: Respiratory complications will improve Outcome: Progressing Goal: Cardiovascular complication will be avoided Outcome: Progressing   Problem: Activity: Goal: Risk for activity intolerance will decrease Outcome: Progressing   Problem: Coping: Goal: Level of anxiety will decrease Outcome: Progressing   Problem: Elimination: Goal: Will not experience complications related to bowel motility Outcome: Progressing Goal: Will not experience complications related to urinary retention Outcome: Progressing   Problem: Pain Managment: Goal: General experience of comfort will improve Outcome: Progressing   Problem: Safety: Goal: Ability to remain free from injury will improve Outcome: Progressing   Problem: Skin Integrity: Goal: Risk for impaired skin integrity will decrease Outcome: Progressing   

## 2019-06-24 NOTE — Progress Notes (Signed)
[place] [facility] [place] NAME: [name]    MR#:  [id number]  DATE OF BIRTH:  29-May-1966  SUBJECTIVE:  CHIEF COMPLAINT:   Chief Complaint  Patient presents with  . Shortness of Breath  . Weakness   Patient only complained of some constipation this morning.  Ordered MiraLAX.  Getting toward hemodialysis session done today. REVIEW OF SYSTEMS:  Review of Systems  Constitutional: Negative for chills and fever.  HENT: Negative for hearing loss and tinnitus.   Eyes: Negative for blurred vision.  Respiratory: Negative for cough and hemoptysis.   Cardiovascular: Negative for chest pain and palpitations.  Gastrointestinal: Positive for constipation. Negative for heartburn and nausea.  Genitourinary: Negative for dysuria and urgency.  Musculoskeletal: Negative for myalgias and neck pain.  Skin: Negative for itching and rash.  Neurological: Negative for dizziness and headaches.  Psychiatric/Behavioral: Negative for depression and hallucinations.    DRUG ALLERGIES:   Allergies  Allergen Reactions  . Shellfish Allergy Anaphylaxis    Face and throat swelling, difficulty breathing Allergy can be triggered by touching (contact)   VITALS:  Blood pressure 131/87, pulse 70, temperature 98.5 F (36.9 C), temperature source Oral, resp. rate 13, height 5\' 3"  (1.6 m), weight 126.7 kg, SpO2 99 %. PHYSICAL EXAMINATION:   Physical Exam  Constitutional: He is oriented to person, place, and time. He appears well-developed and well-nourished.  HENT:  Head: Normocephalic and atraumatic.  Right Ear: External ear normal.  Eyes: Pupils are equal, round, and reactive to light. Conjunctivae are normal.  Neck: Normal range of motion. Neck supple. No thyromegaly present.  Cardiovascular: Normal rate, regular rhythm and normal heart sounds.  Respiratory: Effort normal and breath sounds normal. No respiratory distress.  GI: Soft. Bowel sounds are normal.   Central adiposity  Musculoskeletal: Normal range of motion.        General: No edema.  Neurological: He is alert and oriented to person, place, and time. No cranial nerve deficit.  Skin: Skin is warm. He is not diaphoretic. No erythema.  Psychiatric: He has a normal mood and affect. His behavior is normal.   LABORATORY PANEL:  Male CBC Recent Labs  Lab 06/24/19 0051  WBC 9.6  HGB 8.0*  HCT 26.7*  PLT 156   ------------------------------------------------------------------------------------------------------------------ Chemistries  Recent Labs  Lab 06/22/19 0440  06/24/19 0051  NA 137   < > 137  K 4.3   < > 3.7  CL 103   < > 101  CO2 27   < > 30  GLUCOSE 140*   < > 189*  BUN 25*   < > 21*  CREATININE 4.54*   < > 4.64*  CALCIUM 8.6*   < > 8.3*  MG 1.9  --   --    < > = values in this interval not displayed.   RADIOLOGY:  No results found. ASSESSMENT AND PLAN:   1. Acute on chronic kidney injury.  Progressed to end-stage renal disease.  Patient had PermCath placed recently.  Third dialysis session today.  Patient will go to [facility] on Tuesday Thursday Saturday.    Anticipate discharge tomorrow if outpatient hemodialysis chair is set up 2. Acute on chronic respiratory failure secondary to severe COPD.  Patient on baseline chronic 3 L of oxygen.  Patient is a candidate for noninvasive ventilation at night, which has already been set up by prior physician. 3. Acute on chronic diastolic congestive heart failure.  Dialysis now  to manage fluid.  Patient on low-dose Coreg.  Also on hydralazine and Imdur. 4. Left lower extremity cellulitis on Ancef.  Spoke with pharmacist to change to IV with dialysis for today and tomorrow.  Likely can go over to Keflex upon discharge. 5. Hyperkalemia treated with dialysis, 6. Essential hypertension on Coreg, Norvasc, indoor and hydralazine 7. Hyperlipidemia unspecified on atorvastatin 8. Type 2 diabetes mellitus.  All sugars have  been good.  Patient on sliding scale.  Even though hemoglobin A1c elevated at 14.7.  Outpatient monitoring by primary care physician 9. Constipation.;  Ordered a dose of MiraLAX  DVT prophylaxis; heparin   All the records are reviewed and case discussed with Care Management/Social Worker. Management plans discussed with the patient, family and they are in agreement.  CODE STATUS: Full Code  TOTAL TIME TAKING CARE OF THIS PATIENT: 35 minutes.   More than 50% of the time was spent in counseling/coordination of care: YES  POSSIBLE D/C IN 1-2 DAYS, DEPENDING ON CLINICAL CONDITION.   Jude Ojie M.D on 06/24/2019 at 3:17 PM  Between 7am to 6pm - Pager - [phone]  After 6pm go to [email or web address] - password [email or web address] [facility]  [facility] [place] [facility]  Office  [phone]  CC: Primary care physician; Lavera Guise, MD  Note: This dictation was prepared with Dragon dictation along with smaller phrase technology. Any transcriptional errors that result from this process are unintentional.

## 2019-06-24 NOTE — Progress Notes (Signed)
Pre- nTx Checklist:  HD MD order verified, pre-procedure H2O checks completed, Time out completed.   06/24/19 1330  Vital Signs  Temp 98.5 F (36.9 C)  Temp Source Oral  Pulse Rate 85  Pulse Rate Source Monitor  Resp 20  BP 136/81  BP Location Right Arm  BP Method Automatic  Patient Position (if appropriate) Sitting  Oxygen Therapy  SpO2 94 %  O2 Device Nasal Cannula  O2 Flow Rate (L/min) 3 L/min  Pulse Oximetry Type Continuous  Pain Assessment  Pain Scale 0-10  Pain Score 0  Time-Out for Hemodialysis  What Procedure? HD #3  Pt Identifiers(min of two) First/Last Name;MRN/Account#;Pt's DOB(use if MRN/Acct# not available  Correct Site? Yes  Correct Side? Yes  Correct Procedure? Yes  Consents Verified? Yes  Rad Studies Available? N/A  Safety Precautions Reviewed? Yes  Engineer, civil (consulting) Number 5  Station Number  (ICU 20)  UF/Alarm Test Passed  Conductivity: Meter 14  Conductivity: Machine  13.6  pH 7.4  Reverse Osmosis  (Portable WRO #1)  Normal Saline Lot Number [id number]  Dialyzer Lot Number [id number]  Machine Temperature 99.5 F (37.5 C)  Musician and Audible Yes  Blood Lines Intact and Secured Yes  Pre Treatment Patient Checks  Vascular access used during treatment Catheter  HD catheter dressing before treatment WDL  Patient is receiving dialysis in a chair Yes  Hemodialysis Consent Verified Yes  Hemodialysis Standing Orders Initiated Yes  ECG (Telemetry) Monitor On Yes  Prime Ordered Normal Saline  Length of  DialysisTreatment -hour(s) 3 Hour(s)  Dialysis Treatment Comments  ( Na 140)  Dialyzer Optiflux 180 NR  Dialysate 3K;2.5 Ca  Dialysis Anticoagulant None  Dialysate Flow Ordered 600  Blood Flow Rate Ordered 400 mL/min  Ultrafiltration Goal 3000 Liters  Pre Treatment Labs Renal panel  Dialysis Blood Pressure Support Ordered Normal Saline

## 2019-06-24 NOTE — Progress Notes (Addendum)
Post Tx Note: Pt Vitals stable, SpO2 99% on 3L [place], 3464 mL removed, Pt requesting super meal, communicated to primary care nurse to order meal tray.    06/24/19 1734  Hand-Off documentation  Report given to (Full Name) Harriett Sine, RN  Report received from (Full Name) S.Couch-Bah, RN  Vital Signs  Temp 97.8 F (36.6 C)  Temp Source Oral  Pulse Rate Source Monitor  BP 132/81  BP Location Right Arm  BP Method Automatic  Patient Position (if appropriate) Sitting  Oxygen Therapy  SpO2 99 %  O2 Device Nasal Cannula  O2 Flow Rate (L/min) 3 L/min  Pain Assessment  Pain Scale 0-10  Pain Score 0  Dialysis Weight  Weight  (Unable to weigh in chair)  Type of Weight Post-Dialysis  Post-Hemodialysis Assessment  Rinseback Volume (mL) 250 mL  KECN 275 V  Dialyzer Clearance Lightly streaked  Duration of HD Treatment -hour(s) 3 hour(s)  Hemodialysis Intake (mL) 500 mL  UF Total -Machine (mL) 3464 mL  Net UF (mL) 2964 mL  Tolerated HD Treatment Yes  AVG/AVF Arterial Site Held (minutes)  (N/A)  AVG/AVF Venous Site Held (minutes)  (N/A)  Education / Care Plan  Dialysis Education Provided Yes  Documented Education in Care Plan Yes  Outpatient Plan of Care Reviewed and on Chart Yes  Hemodialysis Catheter Right Internal jugular  Placement Date: 06/22/19   Placed prior to admission: No  Orientation: Right  Access Location: Internal jugular  Site Condition No complications  Blue Lumen Status Heparin locked  Red Lumen Status Heparin locked  Dressing Type Biopatch;Occlusive  Dressing Status Clean;Dry;Intact  Drainage Description None  Dressing Change Due 06/30/19  Post treatment catheter status Capped and Clamped

## 2019-06-24 NOTE — Progress Notes (Signed)
Pre- Tx assessment: Pt A&O, no c/o pain, no distress. Fine and course expiratory crackles note. SpO2 100% on 3L/[place].   06/24/19 1320  Neurological  Level of Consciousness Alert  Orientation Level Oriented X4  Respiratory  Respiratory Pattern Regular;Unlabored  Chest Assessment Chest expansion symmetrical  R Upper  Breath Sounds Clear  L Upper Breath Sounds Fine crackles  R Lower Breath Sounds Coarse crackles  L Lower Breath Sounds Clear  Cardiac  Pulse Regular  Heart Sounds S1, S2  ECG Monitor Yes  Cardiac Rhythm NSR  Vascular  R Radial Pulse +2  L Radial Pulse +2  RLE Edema Non-pitting  LLE Edema Non-Pitting  Psychosocial  Psychosocial (WDL) WDL

## 2019-06-24 NOTE — Progress Notes (Signed)
[facility]  ROUNDING NOTE   Subjective:   Hemodialysis treatment yesterday. Second treatment. Tolerated treatment well. UF of 2 liters  Patient worked with PT yesterday.  He states his shortness of breath has improved.   Objective:  Vital signs in last 24 hours:  Temp:  [97.7 F (36.5 C)-99 F (37.2 C)] 97.7 F (36.5 C) (07/08 0347) Pulse Rate:  [68-80] 68 (07/08 0347) Resp:  [13-24] 20 (07/08 0347) BP: (124-148)/(77-92) 141/88 (07/08 0347) SpO2:  [94 %-100 %] 100 % (07/08 0347)  Weight change:  Filed Weights   06/22/19 0936 06/22/19 1535 06/23/19 0500  Weight: 122.5 kg 122.5 kg 126.7 kg    Intake/Output: I/O last 3 completed shifts: In: 42 [P.O.:720; I.V.:10; IV Piggyback:50] Out: 2000 [Other:2000]   Intake/Output this shift:  Total I/O In: 240 [P.O.:240] Out: -   Physical Exam: General: NAD,   Head: Normocephalic, atraumatic. Moist oral mucosal membranes  Eyes: Anicteric, PERRL  Neck: Supple, trachea midline  Lungs:  Wheezes and crackles bilaterally.   Heart: Regular rate and rhythm  Abdomen:  Soft, nontender,   Extremities:  + peripheral edema.  Neurologic: Nonfocal, moving all four extremities  Skin: Left shin erythema  Access: RIJ permcath 7/6    Basic Metabolic Panel: Recent Labs  Lab 06/20/19 2325 06/21/19 0211 06/21/19 0629 06/21/19 0947 06/21/19 1130 06/22/19 0440 06/23/19 0000 06/24/19 0051  NA 135 136 137 135 135 137 136 137  K 3.9 4.0 4.1 4.1 4.2 4.3 4.1 3.7  CL 102 103 104 102 101 103 101 101  CO2 27 27 28 28 25 27 29 30   GLUCOSE 154* 149* 138* 194* 179* 140* 184* 189*  BUN 15 15 14 14 16  25* 29* 21*  CREATININE 2.36* 2.43* 2.39* 2.43* 2.78* 4.54* 5.73* 4.64*  CALCIUM 8.3* 8.2* 8.5* 8.4* 8.4* 8.6* 8.4* 8.3*  MG 2.0 2.0 1.8 1.8  --  1.9  --   --   PHOS 2.7 2.7 2.8 2.7 2.9 3.8 4.0 3.5    Liver Function Tests: Recent Labs  Lab 06/21/19 0947 06/21/19 1130 06/22/19 0440 06/23/19 0000 06/24/19 0051  ALBUMIN 2.1*  2.1* 2.1* 2.1* 2.1*   No results for input(s): LIPASE, AMYLASE in the last 168 hours. No results for input(s): AMMONIA in the last 168 hours.  CBC: Recent Labs  Lab 06/18/19 0420 06/19/19 0558 06/20/19 0549 06/23/19 1432 06/24/19 0051  WBC 14.4* 9.3 8.2 9.6 9.6  NEUTROABS  --  6.6 5.2  --   --   HGB 9.5* 8.8* 8.5* 7.8* 8.0*  HCT 31.9* 28.8* 28.2* 26.2* 26.7*  MCV 79.0* 78.5* 79.4* 79.4* 79.0*  PLT 200 156 137* 152 156    Cardiac Enzymes: No results for input(s): CKTOTAL, CKMB, CKMBINDEX, TROPONINI in the last 168 hours.  BNP: Invalid input(s): POCBNP  CBG: Recent Labs  Lab 06/23/19 1158 06/23/19 1752 06/23/19 2054 06/24/19 0801 06/24/19 1201  GLUCAP 195* 126* 221* 169* 201*    Microbiology: Results for orders placed or performed during the hospital encounter of 06/15/19  Blood culture (routine x 2)     Status: None   Collection Time: 06/15/19  1:48 PM   Specimen: BLOOD  Result Value Ref Range Status   Specimen Description BLOOD BLOOD LEFT ARM  Final   Special Requests   Final    BOTTLES DRAWN AEROBIC AND ANAEROBIC Blood Culture adequate volume   Culture   Final    NO GROWTH 5 DAYS Performed at [facility], [address] [place], [place] [address]    Report Status 06/20/2019 FINAL  Final  SARS Coronavirus 2 (CEPHEID- Performed in [place] hospital lab), Hosp Order     Status: None   Collection Time: 06/15/19  1:57 PM   Specimen: Nasopharyngeal Swab  Result Value Ref Range Status   SARS Coronavirus 2 NEGATIVE NEGATIVE Final    Comment: (NOTE) If result is NEGATIVE SARS-CoV-2 target nucleic acids are NOT DETECTED. The SARS-CoV-2 RNA is generally detectable in upper and lower  respiratory specimens during the acute phase of infection. The lowest  concentration of SARS-CoV-2 viral copies this assay can detect is 250  copies / mL. A negative result does not preclude SARS-CoV-2 infection  and should not be used as the sole basis for treatment  or other  patient management decisions.  A negative result may occur with  improper specimen collection / handling, submission of specimen other  than nasopharyngeal swab, presence of viral mutation(s) within the  areas targeted by this assay, and inadequate number of viral copies  (<250 copies / mL). A negative result must be combined with clinical  observations, patient history, and epidemiological information. If result is POSITIVE SARS-CoV-2 target nucleic acids are DETECTED. The SARS-CoV-2 RNA is generally detectable in upper and lower  respiratory specimens dur ing the acute phase of infection.  Positive  results are indicative of active infection with SARS-CoV-2.  Clinical  correlation with patient history and other diagnostic information is  necessary to determine patient infection status.  Positive results do  not rule out bacterial infection or co-infection with other viruses. If result is PRESUMPTIVE POSTIVE SARS-CoV-2 nucleic acids MAY BE PRESENT.   A presumptive positive result was obtained on the submitted specimen  and confirmed on repeat testing.  While 2019 novel coronavirus  (SARS-CoV-2) nucleic acids may be present in the submitted sample  additional confirmatory testing may be necessary for epidemiological  and / or clinical management purposes  to differentiate between  SARS-CoV-2 and other Sarbecovirus currently known to infect humans.  If clinically indicated additional testing with an alternate test  methodology [phone]) is advised. The SARS-CoV-2 RNA is generally  detectable in upper and lower respiratory sp ecimens during the acute  phase of infection. The expected result is Negative. Fact Sheet for Patients:  StrictlyIdeas.no Fact Sheet for Healthcare Providers: BankingDealers.co.za This test is not yet approved or cleared by the [place] FDA and has been authorized for detection and/or diagnosis of  SARS-CoV-2 by FDA under an Emergency Use Authorization (EUA).  This EUA will remain in effect (meaning this test can be used) for the duration of the COVID-19 declaration under Section 564(b)(1) of the Act, 21 U.S.C. section 360bbb-3(b)(1), unless the authorization is terminated or revoked sooner. Performed at [facility], [address] [place], [place] [address]   Blood culture (routine x 2)     Status: None   Collection Time: 06/15/19  4:40 PM   Specimen: BLOOD  Result Value Ref Range Status   Specimen Description BLOOD BLOOD RIGHT ARM  Final   Special Requests   Final    BOTTLES DRAWN AEROBIC AND ANAEROBIC Blood Culture adequate volume   Culture   Final    NO GROWTH 5 DAYS Performed at [facility], [address], [place], [place] [address]    Report Status 06/20/2019 FINAL  Final  MRSA PCR Screening     Status: None   Collection Time: 06/22/19  4:40 AM   Specimen: Nasal Mucosa;  Nasopharyngeal  Result Value Ref Range Status   MRSA by PCR NEGATIVE NEGATIVE Final    Comment:        The GeneXpert MRSA Assay (FDA approved for NASAL specimens only), is one component of a comprehensive MRSA colonization surveillance program. It is not intended to diagnose MRSA infection nor to guide or monitor treatment for MRSA infections. Performed at [facility], [address] [place], [place] [address]     Coagulation Studies: No results for input(s): LABPROT, INR in the last 72 hours.  Urinalysis: No results for input(s): COLORURINE, LABSPEC, PHURINE, GLUCOSEU, HGBUR, BILIRUBINUR, KETONESUR, PROTEINUR, UROBILINOGEN, NITRITE, LEUKOCYTESUR in the last 72 hours.  Invalid input(s): APPERANCEUR    Imaging: No results found.   Medications:   .  ceFAZolin (ANCEF) IV 1 g (06/23/19 2108)   . amLODipine  5 mg Oral Daily  . atorvastatin  40 mg Oral QHS  . carvedilol  25 mg Oral Q12H  . chlorhexidine  15 mL Mouth Rinse BID  . Chlorhexidine  Gluconate Cloth  6 each Topical Daily  . cholecalciferol  2,000 Units Oral Daily  . epoetin (EPOGEN/PROCRIT) injection  10,000 Units Intravenous Q T,Th,Sa-HD  . feeding supplement (NEPRO CARB STEADY)  237 mL Oral BID BM  . heparin  5,000 Units Subcutaneous Q8H  . hydrALAZINE  25 mg Oral Q8H  . hydrocerin   Topical TID  . insulin aspart  0-15 Units Subcutaneous TID WC  . insulin aspart  0-5 Units Subcutaneous QHS  . isosorbide mononitrate  60 mg Oral Daily  . mouth rinse  15 mL Mouth Rinse q12n4p  . multivitamin  1 tablet Oral QHS  . sodium chloride flush  10 mL Intracatheter Q12H  . umeclidinium-vilanterol  1 puff Inhalation Daily   acetaminophen **OR** acetaminophen, albuterol, alum & mag hydroxide-simeth, bisacodyl, ondansetron **OR** ondansetron (ZOFRAN) IV, oxyCODONE-acetaminophen, senna-docusate, sodium chloride flush, traZODone  Assessment/ Plan:  [name] is a 53 y.o. black male with Bell's palsy, congestive heart failure, diabetes mellitus type 2, hypertension, obstructive sleep apnea, psoriasis, pulmonary hypertension who was admitted to [facility] on6/29/2020for evaluation of severe acute renal failure in the setting of generalized weakness. Placed on CRRT on 6/29 to 7/5. First intermittent hemodialysis treatment was 7/6. Permcath placed 7/6.  1. 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 Admission creatinine of 16.23.  Acute renal failure secondary to prerenal azotemia leading to ATN from dehydration/hypovolemia Chronic kidney disease secondary to diabetic nephropathy. Strong family history with brother with ESRD on hemodialysis.   Remains anuric.  - Outpatient planning for [facility] for later today. Orders prepared.   2. Hypertension with volume overload. Echocardiogram with preserved systolic function.  Currently on amlodipine, carvedilol, isosorbide mononitrate and hydralazine.  Home  regimen includes furosemide.   3. Anemia of chronic kidney disease: hemoglobin of 8, microcytic.  - EPO with TTS dialysis treatments.   4. Cellulitis: - IV cefazolin   LOS: 9 [name] 7/8/202012:15 PM

## 2019-06-24 NOTE — Progress Notes (Signed)
HD Tx Initiated:   06/24/19 1400  Vital Signs  Pulse Rate 80  Resp 19  BP 139/85  Oxygen Therapy  SpO2 93 %  During Hemodialysis Assessment  Blood Flow Rate (mL/min) 400 mL/min  Arterial Pressure (mmHg) -160 mmHg  Venous Pressure (mmHg) 120 mmHg  Transmembrane Pressure (mmHg) 40 mmHg  Ultrafiltration Rate (mL/min) 1170 mL/min  Dialysate Flow Rate (mL/min) 600 ml/min  Conductivity: Machine  14  HD Safety Checks Performed Yes  Intra-Hemodialysis Comments Tx initiated (Pt A&O x 4, denies pain)

## 2019-06-24 NOTE — Progress Notes (Signed)
Shift summary:  - Requiring PRN pain meds around the clock for R shoulder pain. - Plan for 3rd consecutive day of H/S this evening.

## 2019-06-25 LAB — QUANTIFERON-TB GOLD PLUS: QuantiFERON-TB Gold Plus: UNDETERMINED — AB

## 2019-06-25 LAB — CBC
HCT: 28.8 % — ABNORMAL LOW (ref 39.0–52.0)
Hemoglobin: 8.4 g/dL — ABNORMAL LOW (ref 13.0–17.0)
MCH: 23.3 pg — ABNORMAL LOW (ref 26.0–34.0)
MCHC: 29.2 g/dL — ABNORMAL LOW (ref 30.0–36.0)
MCV: 79.8 fL — ABNORMAL LOW (ref 80.0–100.0)
Platelets: 143 10*3/uL — ABNORMAL LOW (ref 150–400)
RBC: 3.61 MIL/uL — ABNORMAL LOW (ref 4.22–5.81)
RDW: 20 % — ABNORMAL HIGH (ref 11.5–15.5)
WBC: 11.1 10*3/uL — ABNORMAL HIGH (ref 4.0–10.5)
nRBC: 0.9 % — ABNORMAL HIGH (ref 0.0–0.2)

## 2019-06-25 LAB — QUANTIFERON-TB GOLD PLUS (RQFGPL)
QuantiFERON Mitogen Value: 0.39 IU/mL
QuantiFERON Nil Value: 0.11 IU/mL
QuantiFERON TB1 Ag Value: 0.13 IU/mL
QuantiFERON TB2 Ag Value: 0.11 IU/mL

## 2019-06-25 LAB — BASIC METABOLIC PANEL
Anion gap: 9 (ref 5–15)
BUN: 20 mg/dL (ref 6–20)
CO2: 30 mmol/L (ref 22–32)
Calcium: 8.5 mg/dL — ABNORMAL LOW (ref 8.9–10.3)
Chloride: 99 mmol/L (ref 98–111)
Creatinine, Ser: 4.73 mg/dL — ABNORMAL HIGH (ref 0.61–1.24)
GFR calc Af Amer: 15 mL/min — ABNORMAL LOW (ref >60)
GFR calc non Af Amer: 13 mL/min — ABNORMAL LOW (ref >60)
Glucose, Bld: 152 mg/dL — ABNORMAL HIGH (ref 70–99)
Potassium: 3.8 mmol/L (ref 3.5–5.1)
Sodium: 138 mmol/L (ref 135–145)

## 2019-06-25 LAB — GLUCOSE, CAPILLARY
Glucose-Capillary: 181 mg/dL — ABNORMAL HIGH (ref 70–99)
Glucose-Capillary: 248 mg/dL — ABNORMAL HIGH (ref 70–99)

## 2019-06-25 LAB — MAGNESIUM: Magnesium: 1.9 mg/dL (ref 1.7–2.4)

## 2019-06-25 LAB — PHOSPHORUS: Phosphorus: 4.2 mg/dL (ref 2.5–4.6)

## 2019-06-25 MED ORDER — BISACODYL 5 MG PO TBEC
10.0000 mg | DELAYED_RELEASE_TABLET | Freq: Every day | ORAL | Status: DC | PRN
  Administered 2019-06-25: 10 mg via ORAL
  Filled 2019-06-25: qty 2

## 2019-06-25 MED ORDER — RENA-VITE PO TABS: 1.0000 | ORAL_TABLET | Freq: Every day | ORAL | 0 refills | Status: DC

## 2019-06-25 MED ORDER — POLYETHYLENE GLYCOL 3350 17 G PO PACK: 17.0000 g | PACK | Freq: Every day | ORAL | 0 refills | Status: DC | PRN

## 2019-06-25 MED ORDER — NEPRO/CARBSTEADY PO LIQD: 237.0000 mL | Freq: Two times a day (BID) | ORAL | 0 refills | Status: DC

## 2019-06-25 MED ORDER — GABAPENTIN 300 MG PO CAPS: 300.0000 mg | ORAL_CAPSULE | Freq: Two times a day (BID) | ORAL | 0 refills | Status: DC

## 2019-06-25 MED ORDER — CEPHALEXIN 250 MG PO CAPS: 250.0000 mg | ORAL_CAPSULE | Freq: Two times a day (BID) | ORAL | 0 refills | Status: AC

## 2019-06-25 MED ORDER — TRESIBA FLEXTOUCH 100 UNIT/ML ~~LOC~~ SOPN: PEN_INJECTOR | SUBCUTANEOUS | 0 refills | Status: DC

## 2019-06-25 NOTE — Progress Notes (Signed)
Patient accepted at [facility] MWF 3:15pm. Transportation is a concern, however patient stated that he should be able to find a ride tomorrow and get Medicaid transportation set up later. Patient stated that if [facility] can take him, someone would come to pick him up, because they wouldn't just leave him at dialysis. DC called clinic to make aware of transportation issue. Clinic stated that if he can make it to appointment tomorrow, they will work to change chair time to accommodate transportation.  Patient is aware.   Elvera Bicker  Dialysis Coordinator [phone]

## 2019-06-25 NOTE — Progress Notes (Signed)
Initial Nutrition Assessment  DOCUMENTATION CODES:   Morbid obesity  INTERVENTION:   Nepro Shake po BID, each supplement provides 425 kcal and 19 grams protein  Rena-vite daily   Recommend check iron, TIBC, transferrin, ferritin and vitamin D labs  Bowel regimen per MD  NUTRITION DIAGNOSIS:   Increased nutrient needs related to chronic illness(ESRD on HD, morbid obesity) as evidenced by increased estimated needs  GOAL:   Patient will meet greater than or equal to 90% of their needs  MONITOR:   PO intake, Supplement acceptance, Labs, Skin, I & O's  REASON FOR ASSESSMENT:   LOS    ASSESSMENT:   53 y/o male with AKI/CKD IV, now ESRD on new HD, COPD, CHF, and DM.  RD working remotely.  Unable to speak with pt as pt in HD at time of RD visit. Pt with increased estimated needs r/t ESRD on HD and morbid obesity. Pt currently eating 100% of meals and drinking Nepro supplements; RD previously ordered Nepro and rena-vite. Per chart, pt with 35lb(11%) over the past eight months; this is not significant given the time frame. No BM noted since 6/29; pt reports constipation today. Recommend bowel regimen as needed per MD.   Medications reviewed and include: D3, epoetin, heparin, insulin, rena-vite, cefazolin  Labs reviewed: K 3.8 wnl, P 4.2 wnl, Mg 1.9 wnl Wbc- 11.1(H), Hgb 8.4(L), Hct 28.8(L), MCV 79.8(L), MCHC 29.2(L) Cbcs- 169, 201, 139, 247, 181 x 24 hrs AIC 14.7(H)- 7/2 iPTH- 369(H)- 6/30  Unable to complete Nutrition-Focused physical exam at this time.   Diet Order:   Diet Order            Diet renal/carb modified with fluid restriction Diet-HS Snack? Nothing; Fluid restriction: 1200 mL Fluid; Room service appropriate? Yes; Fluid consistency: Thin  Diet effective now             EDUCATION NEEDS:   Not appropriate for education at this time  Skin:  Skin Assessment: Reviewed RN Assessment(LLE cellulitis)  Last BM:  6/29- constipation  Height:   Ht Readings  from Last 1 Encounters:  06/22/19 5\' 3"  (1.6 m)    Weight:   Wt Readings from Last 1 Encounters:  06/25/19 123.2 kg    Ideal Body Weight:  56 kg  BMI:  Body mass index is 48.11 kg/m.  Estimated Nutritional Needs:   Kcal:  2400-2700kcal/day  Protein:  >123g/day  Fluid:  UOP +1L  Koleen Distance MS, RD, LDN Pager #- [phone] Office#- [phone] After Hours Pager: [phone]

## 2019-06-25 NOTE — Discharge Summary (Signed)
[facility] [place] NAME: [name]    MR#:  [id number]  DATE OF BIRTH:  1966/02/11  DATE OF ADMISSION:  06/15/2019   ADMITTING PHYSICIAN: Demetrios Loll, MD  DATE OF DISCHARGE: 06/25/2019  PRIMARY CARE PHYSICIAN: Lavera Guise, MD   ADMISSION DIAGNOSIS:  Acute on chronic respiratory failure with hypoxia (HCC) [J96.21] Acute renal failure, unspecified acute renal failure type ([place]) [N17.9] DISCHARGE DIAGNOSIS:  Active Problems:   Hyperkalemia  SECONDARY DIAGNOSIS:   Past Medical History:  Diagnosis Date  . Bell's palsy   . CHF (congestive heart failure) ([place])   . Chronic kidney disease   . Diabetes mellitus without complication ([place])   . Hypertension   . NSVT (nonsustained ventricular tachycardia) ([place])   . Obstructive sleep apnea   . Psoriasis   . Pulmonary HTN [facility])    HOSPITAL COURSE:  Chief complaint; shortness of breath  History of presenting complaint; [name]  is a 53 y.o. male with a known history of chronic respiratory failure on home oxygen 3 L, CHF, CKD, hypertension, diabetes, OSA, Bell's palsy and pulmonary hypertension etc. The patient presented to ED with of generalized weakness, poor appetite and weight loss for the past 1 week.  He said he developed shortness of breath and leg swelling for 2 to 3 days. He is found hypoxia with 3 L oxygen by nasal cannula and put on 4 L oxygen.  Chest x-ray show pulmonary edema.  He is found severe acute renal failure with creatinine up from 1.44 to 16.23.  Potassium 7.2.  He is treated with calcium gluconate, D50, insulin and Lokelma in the ED and admitted to medical service.  Hospital course; 1. Acute on chronic kidney injury. Progressed to end-stage renal disease. Patient had PermCath placed recently.Patient has had 3 sessions of inpatient hemodialysis.  Patient will go to [facility] on Monday, Wednesday Friday schedule starting from tomorrow Friday.   Cleared for discharge by nephrology service today.  2. Acute on chronic respiratory failure secondary to severe COPD. Patient on baseline chronic 3 L of oxygen. Patient is a candidate for noninvasive ventilation at night, which has already been set up by prior physician Dr. Leslye Peer 3. Acute on chronic diastolic congestive heart failure. Dialysis now to manage fluid. Patient on low-dose Coreg. Also on hydralazine and Imdur. 4. Left lower extremity cellulitis on Ancef.Clinically improved.  Being discharged home p.o. Keflex to complete treatment duration.   5. Hyperkalemia treated with dialysis, 6. Essential hypertension; blood pressure controlled on Coreg, Norvasc, indoor and hydralazine 7. Hyperlipidemia unspecified on atorvastatin 8. Type 2 diabetes mellitus.  Blood sugars have been fairly well controlled.  Glycosylated hemoglobin level elevated at 14.7 likely due to noncompliance with regimen as outpatient.  Decreased dose of long-acting insulin from 16 to 8 units to prevent hypoglycemia post discharge..  Outpatient monitoring by primary care physician 9. Constipation.;  Ordered a dose of MiraLAX yesterday.  Given Dulcolax today.  If no bowel movement will administer enema prior to discharge later today.  Patient clinically and hemodynamically stable.  Plans for discharge later today.  To start outpatient dialysis tomorrow. DISCHARGE CONDITIONS:  Stable CONSULTS OBTAINED:  Treatment Team:  Ottie Glazier, MD Anthonette Legato, MD Henreitta Leber, MD DRUG ALLERGIES:   Allergies  Allergen Reactions  . Shellfish Allergy Anaphylaxis    Face and throat swelling, difficulty breathing Allergy can be triggered by touching (contact)   DISCHARGE MEDICATIONS:   Allergies  as of 06/25/2019      Reactions   Shellfish Allergy Anaphylaxis   Face and throat swelling, difficulty breathing Allergy can be triggered by touching (contact)      Medication List    STOP taking these medications    cyclobenzaprine 5 MG tablet Commonly known as: FLEXERIL   gabapentin 600 MG tablet Commonly known as: NEURONTIN Replaced by: gabapentin 300 MG capsule   magnesium oxide 400 MG tablet Commonly known as: MAG-OX   metolazone 2.5 MG tablet Commonly known as: ZAROXOLYN     TAKE these medications   albuterol 108 (90 Base) MCG/ACT inhaler Commonly known as: VENTOLIN HFA Inhale 2 puffs into the lungs every 4 (four) hours as needed for wheezing or shortness of breath.   allopurinol 300 MG tablet Commonly known as: ZYLOPRIM Take 1 tablet (300 mg total) by mouth daily.   amLODipine 5 MG tablet Commonly known as: NORVASC Take 5 mg by mouth daily.   aspirin EC 81 MG tablet Take 1 tablet (81 mg total) by mouth daily.   atorvastatin 40 MG tablet Commonly known as: LIPITOR Take 1 tablet (40 mg total) by mouth at bedtime.   carvedilol 25 MG tablet Commonly known as: COREG Take 1 tablet (25 mg total) by mouth every 12 (twelve) hours.   cephALEXin 250 MG capsule Commonly known as: KEFLEX Take 1 capsule (250 mg total) by mouth 2 (two) times daily for 5 days.   Cholecalciferol 25 MCG (1000 UT) tablet Commonly known as: D3-1000 Take 2 tablets (2,000 Units total) by mouth daily.   feeding supplement (NEPRO CARB STEADY) Liqd Take 237 mLs by mouth 2 (two) times daily between meals.   furosemide 40 MG tablet Commonly known as: LASIX Take 1.5 tablets (60 mg total) by mouth 2 (two) times daily.   gabapentin 300 MG capsule Commonly known as: Neurontin Take 1 capsule (300 mg total) by mouth 2 (two) times daily. Replaces: gabapentin 600 MG tablet   hydrALAZINE 25 MG tablet Commonly known as: APRESOLINE Take one tab po bid for blood pressure   isosorbide mononitrate 60 MG 24 hr tablet Commonly known as: IMDUR Take 1 tablet (60 mg total) by mouth daily.   multivitamin Tabs tablet Take 1 tablet by mouth at bedtime.   omeprazole 20 MG capsule Commonly known as: PRILOSEC Take 1  capsule (20 mg total) by mouth 2 (two) times daily before a meal.   polyethylene glycol 17 g packet Commonly known as: MiraLax Take 17 g by mouth daily as needed.   traZODone 50 MG tablet Commonly known as: DESYREL Take 0.5-1 tablets (25-50 mg total) by mouth at bedtime as needed for sleep.   Tyler Aas FlexTouch 100 UNIT/ML Sopn FlexTouch Pen Generic drug: insulin degludec Inject 8 units at bed time What changed: additional instructions   triamcinolone 0.1 % cream : eucerin Crea Apply 1 application topically 2 (two) times daily.   Trulicity 1.5 NU/2.7OZ Sopn Generic drug: Dulaglutide Use as directed once a week diag e11.65 What changed: additional instructions   umeclidinium-vilanterol 62.5-25 MCG/INH Aepb Commonly known as: ANORO ELLIPTA Inhale 1 puff into the lungs daily.        DISCHARGE INSTRUCTIONS:   DIET:  Renal diet DISCHARGE CONDITION:  Stable ACTIVITY:  Activity as tolerated OXYGEN:  Home Oxygen: Yes.    Oxygen Delivery: Oxygen via nasal cannula DISCHARGE LOCATION:  home   If you experience worsening of your admission symptoms, develop shortness of breath, life threatening emergency, suicidal or homicidal thoughts you  must seek medical attention immediately by calling 911 or calling your MD immediately  if symptoms less severe.  You Must read complete instructions/literature along with all the possible adverse reactions/side effects for all the Medicines you take and that have been prescribed to you. Take any new Medicines after you have completely understood and accpet all the possible adverse reactions/side effects.   Please note  You were cared for by a hospitalist during your hospital stay. If you have any questions about your discharge medications or the care you received while you were in the hospital after you are discharged, you can call the unit and asked to speak with the hospitalist on call if the hospitalist that took care of you is not  available. Once you are discharged, your primary care physician will handle any further medical issues. Please note that NO REFILLS for any discharge medications will be authorized once you are discharged, as it is imperative that you return to your primary care physician (or establish a relationship with a primary care physician if you do not have one) for your aftercare needs so that they can reassess your need for medications and monitor your lab values.    On the day of Discharge:  VITAL SIGNS:  Blood pressure 128/75, pulse 75, temperature 97.8 F (36.6 C), temperature source Oral, resp. rate 20, height 5\' 3"  (1.6 m), weight 123.2 kg, SpO2 100 %. PHYSICAL EXAMINATION:  GENERAL:  53 y.o.-year-old patient lying in the bed with no acute distress.  EYES: Pupils equal, round, reactive to light and accommodation. No scleral icterus. Extraocular muscles intact.  HEENT: Head atraumatic, normocephalic. Oropharynx and nasopharynx clear.  NECK:  Supple, no jugular venous distention. No thyroid enlargement, no tenderness.  LUNGS: Normal breath sounds bilaterally, no wheezing, rales,rhonchi or crepitation. No use of accessory muscles of respiration.  CARDIOVASCULAR: S1, S2 normal. No murmurs, rubs, or gallops.  ABDOMEN: Soft, non-tender. Central adiposity ,. Bowel sounds present. No organomegaly or mass.  EXTREMITIES: No pedal edema, cyanosis, or clubbing.  NEUROLOGIC: Cranial nerves II through XII are intact. Muscle strength 5/5 in all extremities. Sensation intact. Gait not checked.  PSYCHIATRIC: The patient is alert and oriented x 3.  SKIN: No obvious rash, lesion, or ulcer.  DATA REVIEW:   CBC Recent Labs  Lab 06/25/19 0301  WBC 11.1*  HGB 8.4*  HCT 28.8*  PLT 143*    Chemistries  Recent Labs  Lab 06/25/19 0301  NA 138  K 3.8  CL 99  CO2 30  GLUCOSE 152*  BUN 20  CREATININE 4.73*  CALCIUM 8.5*  MG 1.9     Microbiology Results  Results for orders placed or performed during  the hospital encounter of 06/15/19  Blood culture (routine x 2)     Status: None   Collection Time: 06/15/19  1:48 PM   Specimen: BLOOD  Result Value Ref Range Status   Specimen Description BLOOD BLOOD LEFT ARM  Final   Special Requests   Final    BOTTLES DRAWN AEROBIC AND ANAEROBIC Blood Culture adequate volume   Culture   Final    NO GROWTH 5 DAYS Performed at [facility], [address] [place], [place] [address]    Report Status 06/20/2019 FINAL  Final  SARS Coronavirus 2 (CEPHEID- Performed in [facility] hospital lab), Hosp Order     Status: None   Collection Time: 06/15/19  1:57 PM   Specimen: Nasopharyngeal Swab  Result Value Ref Range Status   SARS  Coronavirus 2 NEGATIVE NEGATIVE Final    Comment: (NOTE) If result is NEGATIVE SARS-CoV-2 target nucleic acids are NOT DETECTED. The SARS-CoV-2 RNA is generally detectable in upper and lower  respiratory specimens during the acute phase of infection. The lowest  concentration of SARS-CoV-2 viral copies this assay can detect is 250  copies / mL. A negative result does not preclude SARS-CoV-2 infection  and should not be used as the sole basis for treatment or other  patient management decisions.  A negative result may occur with  improper specimen collection / handling, submission of specimen other  than nasopharyngeal swab, presence of viral mutation(s) within the  areas targeted by this assay, and inadequate number of viral copies  (<250 copies / mL). A negative result must be combined with clinical  observations, patient history, and epidemiological information. If result is POSITIVE SARS-CoV-2 target nucleic acids are DETECTED. The SARS-CoV-2 RNA is generally detectable in upper and lower  respiratory specimens dur ing the acute phase of infection.  Positive  results are indicative of active infection with SARS-CoV-2.  Clinical  correlation with patient history and other diagnostic information is  necessary to  determine patient infection status.  Positive results do  not rule out bacterial infection or co-infection with other viruses. If result is PRESUMPTIVE POSTIVE SARS-CoV-2 nucleic acids MAY BE PRESENT.   A presumptive positive result was obtained on the submitted specimen  and confirmed on repeat testing.  While 2019 novel coronavirus  (SARS-CoV-2) nucleic acids may be present in the submitted sample  additional confirmatory testing may be necessary for epidemiological  and / or clinical management purposes  to differentiate between  SARS-CoV-2 and other Sarbecovirus currently known to infect humans.  If clinically indicated additional testing with an alternate test  methodology [phone]) is advised. The SARS-CoV-2 RNA is generally  detectable in upper and lower respiratory sp ecimens during the acute  phase of infection. The expected result is Negative. Fact Sheet for Patients:  StrictlyIdeas.no Fact Sheet for Healthcare Providers: BankingDealers.co.za This test is not yet approved or cleared by the [place] FDA and has been authorized for detection and/or diagnosis of SARS-CoV-2 by FDA under an Emergency Use Authorization (EUA).  This EUA will remain in effect (meaning this test can be used) for the duration of the COVID-19 declaration under Section 564(b)(1) of the Act, 21 U.S.C. section 360bbb-3(b)(1), unless the authorization is terminated or revoked sooner. Performed at [facility], [address] [place], [place] [address]   Blood culture (routine x 2)     Status: None   Collection Time: 06/15/19  4:40 PM   Specimen: BLOOD  Result Value Ref Range Status   Specimen Description BLOOD BLOOD RIGHT ARM  Final   Special Requests   Final    BOTTLES DRAWN AEROBIC AND ANAEROBIC Blood Culture adequate volume   Culture   Final    NO GROWTH 5 DAYS Performed at [facility], [address] [place], [place]  [address]    Report Status 06/20/2019 FINAL  Final  MRSA PCR Screening     Status: None   Collection Time: 06/22/19  4:40 AM   Specimen: Nasal Mucosa; Nasopharyngeal  Result Value Ref Range Status   MRSA by PCR NEGATIVE NEGATIVE Final    Comment:        The GeneXpert MRSA Assay (FDA approved for NASAL specimens only), is one component of a comprehensive MRSA colonization surveillance program. It is not intended to diagnose MRSA infection  nor to guide or monitor treatment for MRSA infections. Performed at [facility], [address], [place], [place] [address]     RADIOLOGY:  No results found.   Management plans discussed with the patient, family and they are in agreement.  CODE STATUS: Full Code   TOTAL TIME TAKING CARE OF THIS PATIENT: 36 minutes.    Jude Ojie M.D on 06/25/2019 at 12:06 PM  Between 7am to 6pm - Pager - [phone]  After 6pm go to [email or web address] - password [email or web address] [facility]  [employer] [place] [facility]  Office  [phone]  CC: Primary care physician; Lavera Guise, MD   Note: This dictation was prepared with Dragon dictation along with smaller phrase technology. Any transcriptional errors that result from this process are unintentional.

## 2019-06-25 NOTE — Progress Notes (Signed)
[facility]  ROUNDING NOTE   Subjective:   Hemodialysis treatment yesterday. Third treatment. Tolerated treatment well. UF of 2.9 liters.   Objective:  Vital signs in last 24 hours:  Temp:  [97.8 F (36.6 C)-97.9 F (36.6 C)] 97.8 F (36.6 C) (07/09 0536) Pulse Rate:  [62-80] 75 (07/09 0943) Resp:  [10-23] 20 (07/08 2026) BP: (123-150)/(75-93) 128/75 (07/09 0943) SpO2:  [90 %-100 %] 100 % (07/09 0536) Weight:  [123.2 kg] 123.2 kg (07/09 0500)  Weight change:  Filed Weights   06/23/19 0500 06/25/19 0500  Weight: 126.7 kg 123.2 kg    Intake/Output: I/O last 3 completed shifts: In: 1194 [P.O.:1160; I.V.:10; IV Piggyback:100] Out: 2964 [Other:2964]   Intake/Output this shift:  Total I/O In: 120 [P.O.:120] Out: -   Physical Exam: General: NAD, seated in chair  Head: Normocephalic, atraumatic. Moist oral mucosal membranes  Eyes: Anicteric, PERRL  Neck: Supple, trachea midline  Lungs:  clear   Heart: Regular rate and rhythm  Abdomen:  Soft, nontender,   Extremities:  + peripheral edema.  Neurologic: Nonfocal, moving all four extremities  Skin: Left shin erythema  Access: RIJ permcath 7/6    Basic Metabolic Panel: Recent Labs  Lab 06/21/19 0211 06/21/19 0629 06/21/19 0947 06/21/19 1130 06/22/19 0440 06/23/19 0000 06/24/19 0051 06/25/19 0301  NA 136 137 135 135 137 136 137 138  K 4.0 4.1 4.1 4.2 4.3 4.1 3.7 3.8  CL 103 104 102 101 103 101 101 99  CO2 27 28 28 25 27 29 30 30   GLUCOSE 149* 138* 194* 179* 140* 184* 189* 152*  BUN 15 14 14 16  25* 29* 21* 20  CREATININE 2.43* 2.39* 2.43* 2.78* 4.54* 5.73* 4.64* 4.73*  CALCIUM 8.2* 8.5* 8.4* 8.4* 8.6* 8.4* 8.3* 8.5*  MG 2.0 1.8 1.8  --  1.9  --   --  1.9  PHOS 2.7 2.8 2.7 2.9 3.8 4.0 3.5 4.2    Liver Function Tests: Recent Labs  Lab 06/21/19 0947 06/21/19 1130 06/22/19 0440 06/23/19 0000 06/24/19 0051  ALBUMIN 2.1* 2.1* 2.1* 2.1* 2.1*   No results for input(s): LIPASE, AMYLASE in the  last 168 hours. No results for input(s): AMMONIA in the last 168 hours.  CBC: Recent Labs  Lab 06/19/19 0558 06/20/19 0549 06/23/19 1432 06/24/19 0051 06/25/19 0301  WBC 9.3 8.2 9.6 9.6 11.1*  NEUTROABS 6.6 5.2  --   --   --   HGB 8.8* 8.5* 7.8* 8.0* 8.4*  HCT 28.8* 28.2* 26.2* 26.7* 28.8*  MCV 78.5* 79.4* 79.4* 79.0* 79.8*  PLT 156 137* 152 156 143*    Cardiac Enzymes: No results for input(s): CKTOTAL, CKMB, CKMBINDEX, TROPONINI in the last 168 hours.  BNP: Invalid input(s): POCBNP  CBG: Recent Labs  Lab 06/24/19 1201 06/24/19 1800 06/24/19 2058 06/25/19 0733 06/25/19 1154  GLUCAP 201* 139* 247* 181* 248*    Microbiology: Results for orders placed or performed during the hospital encounter of 06/15/19  Blood culture (routine x 2)     Status: None   Collection Time: 06/15/19  1:48 PM   Specimen: BLOOD  Result Value Ref Range Status   Specimen Description BLOOD BLOOD LEFT ARM  Final   Special Requests   Final    BOTTLES DRAWN AEROBIC AND ANAEROBIC Blood Culture adequate volume   Culture   Final    NO GROWTH 5 DAYS Performed at [facility], [address], [place], [place] [address]    Report Status 06/20/2019 FINAL  Final  SARS Coronavirus 2 (CEPHEID- Performed in [place] hospital lab), Hosp Order     Status: None   Collection Time: 06/15/19  1:57 PM   Specimen: Nasopharyngeal Swab  Result Value Ref Range Status   SARS Coronavirus 2 NEGATIVE NEGATIVE Final    Comment: (NOTE) If result is NEGATIVE SARS-CoV-2 target nucleic acids are NOT DETECTED. The SARS-CoV-2 RNA is generally detectable in upper and lower  respiratory specimens during the acute phase of infection. The lowest  concentration of SARS-CoV-2 viral copies this assay can detect is 250  copies / mL. A negative result does not preclude SARS-CoV-2 infection  and should not be used as the sole basis for treatment or other  patient management decisions.  A negative result may occur  with  improper specimen collection / handling, submission of specimen other  than nasopharyngeal swab, presence of viral mutation(s) within the  areas targeted by this assay, and inadequate number of viral copies  (<250 copies / mL). A negative result must be combined with clinical  observations, patient history, and epidemiological information. If result is POSITIVE SARS-CoV-2 target nucleic acids are DETECTED. The SARS-CoV-2 RNA is generally detectable in upper and lower  respiratory specimens dur ing the acute phase of infection.  Positive  results are indicative of active infection with SARS-CoV-2.  Clinical  correlation with patient history and other diagnostic information is  necessary to determine patient infection status.  Positive results do  not rule out bacterial infection or co-infection with other viruses. If result is PRESUMPTIVE POSTIVE SARS-CoV-2 nucleic acids MAY BE PRESENT.   A presumptive positive result was obtained on the submitted specimen  and confirmed on repeat testing.  While 2019 novel coronavirus  (SARS-CoV-2) nucleic acids may be present in the submitted sample  additional confirmatory testing may be necessary for epidemiological  and / or clinical management purposes  to differentiate between  SARS-CoV-2 and other Sarbecovirus currently known to infect humans.  If clinically indicated additional testing with an alternate test  methodology [phone]) is advised. The SARS-CoV-2 RNA is generally  detectable in upper and lower respiratory sp ecimens during the acute  phase of infection. The expected result is Negative. Fact Sheet for Patients:  StrictlyIdeas.no Fact Sheet for Healthcare Providers: BankingDealers.co.za This test is not yet approved or cleared by the [place] FDA and has been authorized for detection and/or diagnosis of SARS-CoV-2 by FDA under an Emergency Use Authorization (EUA).  This EUA  will remain in effect (meaning this test can be used) for the duration of the COVID-19 declaration under Section 564(b)(1) of the Act, 21 U.S.C. section 360bbb-3(b)(1), unless the authorization is terminated or revoked sooner. Performed at [facility], [address] [place], [place] [address]   Blood culture (routine x 2)     Status: None   Collection Time: 06/15/19  4:40 PM   Specimen: BLOOD  Result Value Ref Range Status   Specimen Description BLOOD BLOOD RIGHT ARM  Final   Special Requests   Final    BOTTLES DRAWN AEROBIC AND ANAEROBIC Blood Culture adequate volume   Culture   Final    NO GROWTH 5 DAYS Performed at [facility], [address] [place], [place] [address]    Report Status 06/20/2019 FINAL  Final  MRSA PCR Screening     Status: None   Collection Time: 06/22/19  4:40 AM   Specimen: Nasal Mucosa; Nasopharyngeal  Result Value Ref Range Status   MRSA by PCR  NEGATIVE NEGATIVE Final    Comment:        The GeneXpert MRSA Assay (FDA approved for NASAL specimens only), is one component of a comprehensive MRSA colonization surveillance program. It is not intended to diagnose MRSA infection nor to guide or monitor treatment for MRSA infections. Performed at [facility], [address] [place], [place] [address]     Coagulation Studies: No results for input(s): LABPROT, INR in the last 72 hours.  Urinalysis: No results for input(s): COLORURINE, LABSPEC, PHURINE, GLUCOSEU, HGBUR, BILIRUBINUR, KETONESUR, PROTEINUR, UROBILINOGEN, NITRITE, LEUKOCYTESUR in the last 72 hours.  Invalid input(s): APPERANCEUR    Imaging: No results found.   Medications:   .  ceFAZolin (ANCEF) IV Stopped (06/24/19 1849)   . amLODipine  5 mg Oral Daily  . atorvastatin  40 mg Oral QHS  . carvedilol  25 mg Oral Q12H  . chlorhexidine  15 mL Mouth Rinse BID  . Chlorhexidine Gluconate Cloth  6 each Topical Daily  . cholecalciferol  2,000 Units Oral  Daily  . epoetin (EPOGEN/PROCRIT) injection  10,000 Units Intravenous Q T,Th,Sa-HD  . feeding supplement (NEPRO CARB STEADY)  237 mL Oral BID BM  . heparin  5,000 Units Subcutaneous Q8H  . hydrALAZINE  25 mg Oral Q8H  . hydrocerin   Topical TID  . insulin aspart  0-15 Units Subcutaneous TID WC  . insulin aspart  0-5 Units Subcutaneous QHS  . isosorbide mononitrate  60 mg Oral Daily  . mouth rinse  15 mL Mouth Rinse q12n4p  . multivitamin  1 tablet Oral QHS  . sodium chloride flush  10 mL Intracatheter Q12H  . umeclidinium-vilanterol  1 puff Inhalation Daily   acetaminophen **OR** acetaminophen, albuterol, alum & mag hydroxide-simeth, bisacodyl, ondansetron **OR** ondansetron (ZOFRAN) IV, oxyCODONE-acetaminophen, senna-docusate, sodium chloride flush, traZODone  Assessment/ Plan:  [name] is a 53 y.o. black male with Bell's palsy, congestive heart failure, diabetes mellitus type 2, hypertension, obstructive sleep apnea, psoriasis, pulmonary hypertension who was admitted to [facility] on6/29/2020for evaluation of severe acute renal failure in the setting of generalized weakness. Placed on CRRT on 6/29 to 7/5. First intermittent hemodialysis treatment was 7/6. Permcath placed 7/6.  1. 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 Admission creatinine of 16.23.  Acute renal failure secondary to prerenal azotemia leading to ATN from dehydration/hypovolemia Chronic kidney disease secondary to diabetic nephropathy. Strong family history with brother with ESRD on hemodialysis.   Remains anuric.  - Outpatient planning for [place] MWF schedule  2. Hypertension with volume overload. Echocardiogram with preserved systolic function.  Currently on amlodipine, carvedilol, isosorbide mononitrate and hydralazine.  Home regimen includes furosemide.   3. Anemia of chronic kidney disease:    - EPO with TTS dialysis treatments.    4. Cellulitis: - PO cephalexin.    LOS: 10 Sarath Kolluru 7/9/20202:03 PM

## 2019-06-25 NOTE — Care Management Important Message (Signed)
Important Message  Patient Details  Name: [name] MRN: [id number] Date of Birth: 1966-09-01   Medicare Important Message Given:  Yes     Dannette Barbara 06/25/2019, 1:07 PM

## 2019-06-25 NOTE — Progress Notes (Signed)
MD ordered patient to be discharged home.  Discharge instructions were reviewed with the patient and he voiced understanding.  Follow-up appointment was made.  Prescriptions given to the patient.  Patient was sent home with O2. Patient did have a BM after the enema, encouraged to continue the Miralax. IV was removed with catheter intact.  All patients questions were answered.  Patient left via wheelchair escorted by nursing.

## 2019-06-26 DIAGNOSIS — N179 Acute kidney failure, unspecified: Secondary | ICD-10-CM | POA: Diagnosis not present

## 2019-06-26 DIAGNOSIS — N2581 Secondary hyperparathyroidism of renal origin: Secondary | ICD-10-CM | POA: Diagnosis not present

## 2019-06-26 DIAGNOSIS — N189 Chronic kidney disease, unspecified: Secondary | ICD-10-CM | POA: Diagnosis not present

## 2019-06-26 DIAGNOSIS — D509 Iron deficiency anemia, unspecified: Secondary | ICD-10-CM | POA: Diagnosis not present

## 2019-06-29 ENCOUNTER — Telehealth: Payer: Self-pay

## 2019-06-29 DIAGNOSIS — N2581 Secondary hyperparathyroidism of renal origin: Secondary | ICD-10-CM | POA: Diagnosis not present

## 2019-06-29 DIAGNOSIS — N189 Chronic kidney disease, unspecified: Secondary | ICD-10-CM | POA: Diagnosis not present

## 2019-06-29 DIAGNOSIS — Z79899 Other long term (current) drug therapy: Secondary | ICD-10-CM | POA: Diagnosis not present

## 2019-06-29 DIAGNOSIS — N179 Acute kidney failure, unspecified: Secondary | ICD-10-CM | POA: Diagnosis not present

## 2019-06-29 DIAGNOSIS — E785 Hyperlipidemia, unspecified: Secondary | ICD-10-CM | POA: Diagnosis not present

## 2019-06-29 DIAGNOSIS — Z6841 Body Mass Index (BMI) 40.0 and over, adult: Secondary | ICD-10-CM | POA: Diagnosis not present

## 2019-06-29 DIAGNOSIS — Z9981 Dependence on supplemental oxygen: Secondary | ICD-10-CM | POA: Diagnosis not present

## 2019-06-29 DIAGNOSIS — I132 Hypertensive heart and chronic kidney disease with heart failure and with stage 5 chronic kidney disease, or end stage renal disease: Secondary | ICD-10-CM | POA: Diagnosis not present

## 2019-06-29 DIAGNOSIS — G4733 Obstructive sleep apnea (adult) (pediatric): Secondary | ICD-10-CM | POA: Diagnosis not present

## 2019-06-29 DIAGNOSIS — I472 Ventricular tachycardia: Secondary | ICD-10-CM | POA: Diagnosis not present

## 2019-06-29 DIAGNOSIS — L03116 Cellulitis of left lower limb: Secondary | ICD-10-CM | POA: Diagnosis not present

## 2019-06-29 DIAGNOSIS — J449 Chronic obstructive pulmonary disease, unspecified: Secondary | ICD-10-CM | POA: Diagnosis not present

## 2019-06-29 DIAGNOSIS — N186 End stage renal disease: Secondary | ICD-10-CM | POA: Diagnosis not present

## 2019-06-29 DIAGNOSIS — Z955 Presence of coronary angioplasty implant and graft: Secondary | ICD-10-CM | POA: Diagnosis not present

## 2019-06-29 DIAGNOSIS — J9621 Acute and chronic respiratory failure with hypoxia: Secondary | ICD-10-CM | POA: Diagnosis not present

## 2019-06-29 DIAGNOSIS — E1122 Type 2 diabetes mellitus with diabetic chronic kidney disease: Secondary | ICD-10-CM | POA: Diagnosis not present

## 2019-06-29 DIAGNOSIS — I5033 Acute on chronic diastolic (congestive) heart failure: Secondary | ICD-10-CM | POA: Diagnosis not present

## 2019-06-29 DIAGNOSIS — D509 Iron deficiency anemia, unspecified: Secondary | ICD-10-CM | POA: Diagnosis not present

## 2019-06-29 DIAGNOSIS — Z992 Dependence on renal dialysis: Secondary | ICD-10-CM | POA: Diagnosis not present

## 2019-06-29 DIAGNOSIS — Z7982 Long term (current) use of aspirin: Secondary | ICD-10-CM | POA: Diagnosis not present

## 2019-06-29 DIAGNOSIS — I272 Pulmonary hypertension, unspecified: Secondary | ICD-10-CM | POA: Diagnosis not present

## 2019-06-29 NOTE — Telephone Encounter (Signed)
See other note

## 2019-06-29 NOTE — Telephone Encounter (Signed)
Gave verbal order to advanced home care nurse patricia [phone] for nursing 2 times a week for 1 week ans 1 times a week for 8 weeks

## 2019-06-30 ENCOUNTER — Ambulatory Visit (INDEPENDENT_AMBULATORY_CARE_PROVIDER_SITE_OTHER): Payer: Medicare Other | Admitting: Internal Medicine

## 2019-06-30 ENCOUNTER — Encounter: Payer: Self-pay | Admitting: Internal Medicine

## 2019-06-30 ENCOUNTER — Telehealth: Payer: Self-pay

## 2019-06-30 ENCOUNTER — Other Ambulatory Visit: Payer: Self-pay

## 2019-06-30 VITALS — BP 130/77 | HR 70 | Resp 16 | Ht 63.0 in | Wt 266.4 lb

## 2019-06-30 DIAGNOSIS — E1165 Type 2 diabetes mellitus with hyperglycemia: Secondary | ICD-10-CM

## 2019-06-30 DIAGNOSIS — G4733 Obstructive sleep apnea (adult) (pediatric): Secondary | ICD-10-CM | POA: Diagnosis not present

## 2019-06-30 DIAGNOSIS — Z794 Long term (current) use of insulin: Secondary | ICD-10-CM | POA: Diagnosis not present

## 2019-06-30 DIAGNOSIS — I5032 Chronic diastolic (congestive) heart failure: Secondary | ICD-10-CM

## 2019-06-30 DIAGNOSIS — N183 Chronic kidney disease, stage 3 unspecified: Secondary | ICD-10-CM

## 2019-06-30 NOTE — Progress Notes (Signed)
[facility] [place], [place] [address]  Internal MEDICINE  Office Visit Note  Patient Name: [name]  [id number]  [id number]  Date of Service: 07/15/2019  Chief Complaint  Patient presents with  . Chronic Kidney Disease  . Diabetes  . Hypertension  . Hyperlipidemia  . Hyperkalemia    HPI Pt is here for recent hospital follow up.c/o not sleeping well.  Hospital course; 1. Acute on chronic kidney injury. Progressed to end-stage renal disease. Patient had PermCath placedrecently.Patient has had 3 sessions of inpatient hemodialysis.  Patient will go to [facility] on Monday, Wednesday Friday schedule starting from tomorrow Friday.  Cleared for discharge by nephrology service today.  2. Acute on chronic respiratory failure secondary to severe COPD. Patient on baseline chronic 3 L of oxygen. Patient is a candidate for noninvasive ventilation at night,which has already been set up by prior physician Dr. Leslye Peer 3. Acute on chronic diastolic congestive heart failure. Dialysis now to manage fluid. Patient on low-dose Coreg. Also on hydralazine and Imdur. 4. Left lower extremity cellulitis on Ancef.Clinically improved.  Being discharged home p.o. Keflex to complete treatment duration.   5. Hyperkalemia treated with dialysis, 6. Essential hypertension; blood pressure controlled on Coreg, Norvasc, indoor and hydralazine 7. Hyperlipidemia unspecified on atorvastatin 8. Type 2 diabetes mellitus.  Blood sugars have been fairly well controlled.  Glycosylated hemoglobin level elevated at 14.7 likely due to noncompliance with regimen as outpatient.  Decreased dose of long-acting insulin from 16 to 8 units to prevent hypoglycemia post discharge..Outpatient monitoring by primary care physician  Current Medication: Outpatient Encounter Medications as of 06/30/2019  Medication Sig  . albuterol (PROVENTIL HFA;VENTOLIN HFA) 108 (90 Base) MCG/ACT  inhaler Inhale 2 puffs into the lungs every 4 (four) hours as needed for wheezing or shortness of breath.  . allopurinol (ZYLOPRIM) 300 MG tablet Take 1 tablet (300 mg total) by mouth daily.  [name] Kitchen amLODipine (NORVASC) 5 MG tablet Take 5 mg by mouth daily.   [name] Kitchen aspirin EC 81 MG tablet Take 1 tablet (81 mg total) by mouth daily.  [name] Kitchen atorvastatin (LIPITOR) 40 MG tablet Take 1 tablet (40 mg total) by mouth at bedtime.  . carvedilol (COREG) 25 MG tablet Take 1 tablet (25 mg total) by mouth every 12 (twelve) hours.  . [EXPIRED] cephALEXin (KEFLEX) 250 MG capsule Take 1 capsule (250 mg total) by mouth 2 (two) times daily for 5 days.  . Cholecalciferol (D3-1000) 25 MCG (1000 UT) tablet Take 2 tablets (2,000 Units total) by mouth daily.  . Dulaglutide (TRULICITY) 1.5 GU/5.4YH SOPN Use as directed once a week diag e11.65  . furosemide (LASIX) 40 MG tablet Take 1.5 tablets (60 mg total) by mouth 2 (two) times daily.  [name] Kitchen gabapentin (NEURONTIN) 300 MG capsule Take 1 capsule (300 mg total) by mouth 2 (two) times daily.  . hydrALAZINE (APRESOLINE) 25 MG tablet Take one tab po bid for blood pressure  . insulin degludec (TRESIBA FLEXTOUCH) 100 UNIT/ML SOPN FlexTouch Pen Inject 8 units at bed time  . isosorbide mononitrate (IMDUR) 60 MG 24 hr tablet Take 1 tablet (60 mg total) by mouth daily.  . multivitamin (RENA-VIT) TABS tablet Take 1 tablet by mouth at bedtime.  . Nutritional Supplements (FEEDING SUPPLEMENT, NEPRO CARB STEADY,) LIQD Take 237 mLs by mouth 2 (two) times daily between meals.  [name] Kitchen omeprazole (PRILOSEC) 20 MG capsule Take 1 capsule (20 mg total) by mouth 2 (two) times daily before a meal.  . traZODone (  DESYREL) 100 MG tablet Take one or one and half tab at night for sleep  . Triamcinolone Acetonide (TRIAMCINOLONE 0.1 % CREAM : EUCERIN) CREA Apply 1 application topically 2 (two) times daily.  [name] Kitchen umeclidinium-vilanterol (ANORO ELLIPTA) 62.5-25 MCG/INH AEPB Inhale 1 puff into the lungs daily.  . [DISCONTINUED]  polyethylene glycol (MIRALAX) 17 g packet Take 17 g by mouth daily as needed.  . [DISCONTINUED] traZODone (DESYREL) 50 MG tablet Take 0.5-1 tablets (25-50 mg total) by mouth at bedtime as needed for sleep.   No facility-administered encounter medications on file as of 06/30/2019.    Surgical History: Past Surgical History:  Procedure Laterality Date  . CORONARY ANGIOPLASTY WITH STENT PLACEMENT    . DIALYSIS/PERMA CATHETER INSERTION N/A 06/22/2019   Procedure: DIALYSIS/PERMA CATHETER INSERTION;  Surgeon: Algernon Huxley, MD;  Location: [place] [facility];  Service: Cardiovascular;  Laterality: N/A;  . NO PAST SURGERIES     Medical History: Past Medical History:  Diagnosis Date  . Bell's palsy   . CHF (congestive heart failure) ([place])   . Chronic kidney disease   . Diabetes mellitus without complication ([place])   . Hypertension   . NSVT (nonsustained ventricular tachycardia) ([place])   . Obstructive sleep apnea   . Psoriasis   . Pulmonary HTN ([place])    Family History: Family History  Problem Relation Age of Onset  . Heart failure Mother   . Kidney failure Brother    Social History   Socioeconomic History  . Marital status: Single    Spouse name: Not on file  . Number of children: Not on file  . Years of education: Not on file  . Highest education level: Not on file  Occupational History  . Not on file  Social Needs  . Financial resource strain: Somewhat hard  . Food insecurity    Worry: Often true    Inability: Often true  . Transportation needs    Medical: Yes    Non-medical: Yes  Tobacco Use  . Smoking status: Never Smoker  . Smokeless tobacco: Never Used  Substance and Sexual Activity  . Alcohol use: Not Currently  . Drug use: No  . Sexual activity: Not on file  Lifestyle  . Physical activity    Days per week: 0 days    Minutes per session: 0 min  . Stress: Rather much  Relationships  . Social Herbalist on phone: Not on file    Gets together: Not on  file    Attends religious service: Not on file    Active member of club or organization: Not on file    Attends meetings of clubs or organizations: Not on file    Relationship status: Not on file  . Intimate partner violence    Fear of current or ex partner: Not on file    Emotionally abused: Not on file    Physically abused: Not on file    Forced sexual activity: Not on file  Other Topics Concern  . Not on file  Social History Narrative  . Not on file   Review of Systems  Constitutional: Positive for unexpected weight change. Negative for chills and fatigue.  HENT: Positive for postnasal drip. Negative for congestion, rhinorrhea, sneezing and sore throat.   Eyes: Negative for redness.  Respiratory: Positive for shortness of breath. Negative for cough and chest tightness.   Cardiovascular: Negative for chest pain and palpitations.  Gastrointestinal: Negative for abdominal pain, constipation, diarrhea, nausea and  vomiting.  Genitourinary: Negative for dysuria and frequency.  Musculoskeletal: Negative for arthralgias, back pain, joint swelling and neck pain.  Skin: Negative for rash.  Neurological: Negative.  Negative for tremors and numbness.  Hematological: Negative for adenopathy. Does not bruise/bleed easily.  Psychiatric/Behavioral: Negative for behavioral problems (Depression), sleep disturbance and suicidal ideas. The patient is not nervous/anxious.    Vital Signs: BP 130/77   Pulse 70   Resp 16   Ht 5\' 3"  (1.6 m)   Wt 266 lb 6.4 oz (120.8 kg)   SpO2 100%   BMI 47.19 kg/m   Physical Exam Constitutional:      General: He is not in acute distress.    Appearance: He is well-developed. He is not diaphoretic.  HENT:     Head: Normocephalic and atraumatic.     Mouth/Throat:     Pharynx: No oropharyngeal exudate.  Eyes:     Pupils: Pupils are equal, round, and reactive to light.  Neck:     Musculoskeletal: Normal range of motion and neck supple.     Thyroid: No  thyromegaly.     Vascular: No JVD.     Trachea: No tracheal deviation.  Cardiovascular:     Rate and Rhythm: Normal rate and regular rhythm.     Heart sounds: Normal heart sounds. No murmur. No friction rub. No gallop.   Pulmonary:     Effort: Pulmonary effort is normal. No respiratory distress.     Breath sounds: No wheezing or rales.  Chest:     Chest wall: No tenderness.  Abdominal:     General: Bowel sounds are normal. There is distension.  Musculoskeletal: Normal range of motion.        General: Swelling present.     Right lower leg: Edema present.     Left lower leg: Edema present.  Lymphadenopathy:     Cervical: No cervical adenopathy.  Skin:    General: Skin is warm and dry.  Neurological:     Mental Status: He is alert and oriented to person, place, and time.     Cranial Nerves: No cranial nerve deficit.  Psychiatric:        Behavior: Behavior normal.        Thought Content: Thought content normal.        Judgment: Judgment normal.    Assessment/Plan: 1. Type 2 diabetes mellitus with hyperglycemia, with long-term current use of insulin (HCC) - Therapy is reviewed with the pt. Will continue   - Ambulatory referral to Ophthalmology  2. Obstructive sleep apnea - traZODone (DESYREL) 100 MG tablet; Take one or one and half tab at night for sleep  Dispense: 45 tablet; Refill: 6  3. Chronic kidney disease, stage III (moderate) (HCC) - Continue nephrology, HD   4. Chronic diastolic congestive heart failure [facility]) - Per Cardiology   General Counseling: reco shonk understanding of the findings of todays visit and agrees with plan of treatment. I have discussed any further diagnostic evaluation that may be needed or ordered today. We also reviewed his medications today. he has been encouraged to call the office with any questions or concerns that should arise related to todays visit.  Orders Placed This Encounter  Procedures  . Ambulatory referral to Ophthalmology    I have reviewed all medical records from hospital follow up including radiology reports and consults from other physicians. Appropriate follow up diagnostics will be scheduled as needed. Patient/ Family understands the plan of treatment. Time spent35 minutes.  Dr Lavera Guise, MD Internal Medicine

## 2019-06-30 NOTE — Progress Notes (Deleted)
Patient ID: [name], male    DOB: February 27, 1966, 52 y.o.   MRN: [id number]  HPI  Mr Mizell is a 53 y/o male with a history of DM, HTN, CKD, bell's palsy, NSVT, obstructive sleep apnea, pulmonary HTN and chronic heart failure.   Echo report from 06/16/2019 reviewed and showed an EF of 50-55%. Echo report from 03/26/17 reviewed and showed an EF of 40-45%.   Cardiac catheterization done 03/28/17 showed moderate pulmonary HTN, preserved cardiac output and elevated right heart filling pressures.  Admitted 06/15/2019 due to heart failure exacerbation. Vascular surgery and nephrology consults obtained. Had acute renal failure with creatinine up to 16.23. Permcath was placed for dialysis. Given antibiotics due to leg cellulitis. Discharged after 10 days.   He presents today for a follow-up visit (although hasn't been seen since February 2019) with a chief complaint of  Dialysis being done on M, W, F.   Past Medical History:  Diagnosis Date  . Bell's palsy   . CHF (congestive heart failure) ([place])   . Chronic kidney disease   . Diabetes mellitus without complication ([place])   . Hypertension   . NSVT (nonsustained ventricular tachycardia) ([place])   . Obstructive sleep apnea   . Psoriasis   . Pulmonary HTN ([place])    Past Surgical History:  Procedure Laterality Date  . CORONARY ANGIOPLASTY WITH STENT PLACEMENT    . DIALYSIS/PERMA CATHETER INSERTION N/A 06/22/2019   Procedure: DIALYSIS/PERMA CATHETER INSERTION;  Surgeon: Algernon Huxley, MD;  Location: [facility];  Service: Cardiovascular;  Laterality: N/A;  . NO PAST SURGERIES     Family History  Problem Relation Age of Onset  . Heart failure Mother   . Kidney failure Brother    Social History   Tobacco Use  . Smoking status: Never Smoker  . Smokeless tobacco: Never Used  Substance Use Topics  . Alcohol use: Not Currently   Allergies  Allergen Reactions  . Shellfish Allergy Anaphylaxis    Face and throat swelling,  difficulty breathing Allergy can be triggered by touching (contact)     Review of Systems  Constitutional: Positive for fatigue. Negative for appetite change.  HENT: Negative for congestion, postnasal drip and sore throat.   Eyes: Positive for discharge (clear discharge) and visual disturbance (blurry vision).  Respiratory: Positive for shortness of breath. Negative for chest tightness.   Cardiovascular: Negative for chest pain, palpitations and leg swelling.  Gastrointestinal: Negative for abdominal distention and abdominal pain.  Endocrine: Negative.   Genitourinary: Negative.   Musculoskeletal: Positive for back pain (chronic). Negative for neck pain.  Skin: Negative.   Allergic/Immunologic: Negative.   Neurological: Negative for dizziness and light-headedness.  Hematological: Negative for adenopathy. Does not bruise/bleed easily.  Psychiatric/Behavioral: Positive for sleep disturbance (sleeping interrupted). Negative for dysphoric mood. The patient is nervous/anxious (when rushing around).      Physical Exam  Constitutional: He is oriented to person, place, and time. He appears well-developed and well-nourished.  HENT:  Head: Normocephalic and atraumatic.  Neck: Normal range of motion. Neck supple. No JVD present.  Cardiovascular: Normal rate and regular rhythm.  Pulmonary/Chest: Effort normal. He has no wheezes. He has no rales.  Abdominal: Soft. He exhibits no distension. There is no abdominal tenderness.  Musculoskeletal:        General: No tenderness or edema.  Neurological: He is alert and oriented to person, place, and time.  Skin: Skin is warm and dry.  Psychiatric: He has a normal mood and  affect. His behavior is normal. Thought content normal.  Nursing note and vitals reviewed.  Assessment & Plan:  1: Chronic heart failure with preserved ejection fraction- - NYHA class III - euvolemic today - weighing daily and says that he's gradually lost weight. Instructed  to call for an overnight weight gain of >2 pounds or a weekly weight gain of >5 pounds -  - not adding salt and has been trying to read food labels. Does rinse off canned foods. Reviewed the importance of closely following a 2000mg  sodium diet and written dietary information was given to him about this - wearing compression socks daily; encouraged him to elevate his legs when sitting for long periods of time - saw [facility]ology at [facility]) 09/26/17 & is waiting on a follow-up appointment -  - BNP from 06/15/2019 was 173.0   2: HTN- - BP  - saw PCP Clayborn Bigness) 06/30/2019 - BMP from 06/25/2019 reviewed and showed sodium 138, potassium 3.8, creatinine 4.73 and GFR 15  3: Diabetes- - saw nephrology Radene Knee) November 2018 - A1c on 06/18/2019 was 14.7% - fasting glucose at home this morning was   4: Obstructive sleep apnea- - wearing bipap at night - wearing oxygen at 3L around the clock and says that his breathing has improved since doing that - saw pulmonology Versie Starks) 06/09/2019  Patient did not bring his medications nor a list. Each medication was verbally reviewed with the patient and he was encouraged to bring the bottles to every visit to confirm accuracy of list.

## 2019-06-30 NOTE — Telephone Encounter (Signed)
Patient's portable O2 was approved with his insurance 06/15/19 and DME [facility] is waiting on shipment and then it will be delivered to patient, this process for the portable ones do take longer then the in home oxygen. Beth

## 2019-07-01 ENCOUNTER — Ambulatory Visit: Payer: Medicare Other | Admitting: Family

## 2019-07-01 DIAGNOSIS — N189 Chronic kidney disease, unspecified: Secondary | ICD-10-CM | POA: Diagnosis not present

## 2019-07-01 DIAGNOSIS — N179 Acute kidney failure, unspecified: Secondary | ICD-10-CM | POA: Diagnosis not present

## 2019-07-01 DIAGNOSIS — D509 Iron deficiency anemia, unspecified: Secondary | ICD-10-CM | POA: Diagnosis not present

## 2019-07-01 DIAGNOSIS — N2581 Secondary hyperparathyroidism of renal origin: Secondary | ICD-10-CM | POA: Diagnosis not present

## 2019-07-03 DIAGNOSIS — N183 Chronic kidney disease, stage 3 (moderate): Secondary | ICD-10-CM | POA: Diagnosis not present

## 2019-07-03 DIAGNOSIS — D509 Iron deficiency anemia, unspecified: Secondary | ICD-10-CM | POA: Diagnosis not present

## 2019-07-03 DIAGNOSIS — N179 Acute kidney failure, unspecified: Secondary | ICD-10-CM | POA: Diagnosis not present

## 2019-07-03 DIAGNOSIS — D649 Anemia, unspecified: Secondary | ICD-10-CM | POA: Diagnosis not present

## 2019-07-06 ENCOUNTER — Other Ambulatory Visit: Payer: Self-pay

## 2019-07-06 ENCOUNTER — Encounter: Payer: Self-pay | Admitting: Podiatry

## 2019-07-06 ENCOUNTER — Ambulatory Visit (INDEPENDENT_AMBULATORY_CARE_PROVIDER_SITE_OTHER): Payer: Medicare Other | Admitting: Podiatry

## 2019-07-06 VITALS — Temp 98.7°F

## 2019-07-06 DIAGNOSIS — B351 Tinea unguium: Secondary | ICD-10-CM

## 2019-07-06 DIAGNOSIS — D649 Anemia, unspecified: Secondary | ICD-10-CM | POA: Diagnosis not present

## 2019-07-06 DIAGNOSIS — M79676 Pain in unspecified toe(s): Secondary | ICD-10-CM | POA: Diagnosis not present

## 2019-07-06 DIAGNOSIS — N179 Acute kidney failure, unspecified: Secondary | ICD-10-CM | POA: Diagnosis not present

## 2019-07-06 DIAGNOSIS — E1142 Type 2 diabetes mellitus with diabetic polyneuropathy: Secondary | ICD-10-CM

## 2019-07-06 DIAGNOSIS — I872 Venous insufficiency (chronic) (peripheral): Secondary | ICD-10-CM

## 2019-07-06 DIAGNOSIS — N183 Chronic kidney disease, stage 3 (moderate): Secondary | ICD-10-CM | POA: Diagnosis not present

## 2019-07-06 DIAGNOSIS — D509 Iron deficiency anemia, unspecified: Secondary | ICD-10-CM | POA: Diagnosis not present

## 2019-07-06 DIAGNOSIS — L409 Psoriasis, unspecified: Secondary | ICD-10-CM

## 2019-07-06 NOTE — Progress Notes (Signed)
Complaint:  Visit Type: Patient returns to my office for continued preventative foot care services. Complaint: Patient states" my nails have grown long and thick and become painful to walk and wear shoes" Patient has been diagnosed with DM with no foot complications. The patient presents for preventative foot care services. No changes to ROS.  Patient was in the hospital for 2 weeks and is now on dialysis and oxygen.  Podiatric Exam: Vascular: dorsalis pedis and posterior tibial pulses are weakly palpable bilateral due to swelling. Capillary return is immediate. Temperature gradient is WNL. Skin turgor WNL Venous stasis  B/L. Sensorium: Normal Semmes Weinstein monofilament test. Normal tactile sensation bilaterally. Nail Exam: Pt has thick disfigured discolored nails with subungual debris noted bilateral entire nail hallux through fifth toenails Ulcer Exam: There is no evidence of ulcer or pre-ulcerative changes or infection. Orthopedic Exam: Muscle tone and strength are WNL. No limitations in general ROM. No crepitus or effusions noted. Foot type and digits show no abnormalities. Bony prominences are unremarkable. Skin: No Porokeratosis. No infection or ulcers.  Plantar punctate psoriasis localized to heels  B/L.  Diagnosis:  Onychomycosis, , Pain in right toe, pain in left toes  Treatment & Plan Procedures and Treatment: Consent by patient was obtained for treatment procedures.   Debridement of mycotic and hypertrophic toenails, 1 through 5 bilateral and clearing of subungual debris. No ulceration, no infection noted. Patient was given medicine by his dermatologist. Return Visit-Office Procedure: Patient instructed to return to the office for a follow up visit 10 weeks  for continued evaluation and treatment.    Gardiner Barefoot DPM

## 2019-07-07 ENCOUNTER — Ambulatory Visit: Payer: Medicare Other | Admitting: Internal Medicine

## 2019-07-07 DIAGNOSIS — J449 Chronic obstructive pulmonary disease, unspecified: Secondary | ICD-10-CM | POA: Diagnosis not present

## 2019-07-07 DIAGNOSIS — E1122 Type 2 diabetes mellitus with diabetic chronic kidney disease: Secondary | ICD-10-CM | POA: Diagnosis not present

## 2019-07-07 DIAGNOSIS — I5033 Acute on chronic diastolic (congestive) heart failure: Secondary | ICD-10-CM | POA: Diagnosis not present

## 2019-07-07 DIAGNOSIS — I132 Hypertensive heart and chronic kidney disease with heart failure and with stage 5 chronic kidney disease, or end stage renal disease: Secondary | ICD-10-CM | POA: Diagnosis not present

## 2019-07-07 DIAGNOSIS — J9621 Acute and chronic respiratory failure with hypoxia: Secondary | ICD-10-CM | POA: Diagnosis not present

## 2019-07-07 DIAGNOSIS — N186 End stage renal disease: Secondary | ICD-10-CM | POA: Diagnosis not present

## 2019-07-08 DIAGNOSIS — N183 Chronic kidney disease, stage 3 (moderate): Secondary | ICD-10-CM | POA: Diagnosis not present

## 2019-07-08 DIAGNOSIS — D649 Anemia, unspecified: Secondary | ICD-10-CM | POA: Diagnosis not present

## 2019-07-08 DIAGNOSIS — N179 Acute kidney failure, unspecified: Secondary | ICD-10-CM | POA: Diagnosis not present

## 2019-07-08 DIAGNOSIS — D509 Iron deficiency anemia, unspecified: Secondary | ICD-10-CM | POA: Diagnosis not present

## 2019-07-10 ENCOUNTER — Other Ambulatory Visit: Payer: Self-pay

## 2019-07-10 DIAGNOSIS — D649 Anemia, unspecified: Secondary | ICD-10-CM | POA: Diagnosis not present

## 2019-07-10 DIAGNOSIS — N179 Acute kidney failure, unspecified: Secondary | ICD-10-CM | POA: Diagnosis not present

## 2019-07-10 DIAGNOSIS — N183 Chronic kidney disease, stage 3 (moderate): Secondary | ICD-10-CM | POA: Diagnosis not present

## 2019-07-10 DIAGNOSIS — D509 Iron deficiency anemia, unspecified: Secondary | ICD-10-CM | POA: Diagnosis not present

## 2019-07-10 NOTE — Telephone Encounter (Signed)
Gave verbal order to [facility] jeff [phone] one time a week for 1 week

## 2019-07-13 DIAGNOSIS — N179 Acute kidney failure, unspecified: Secondary | ICD-10-CM | POA: Diagnosis not present

## 2019-07-13 DIAGNOSIS — D509 Iron deficiency anemia, unspecified: Secondary | ICD-10-CM | POA: Diagnosis not present

## 2019-07-13 DIAGNOSIS — N183 Chronic kidney disease, stage 3 (moderate): Secondary | ICD-10-CM | POA: Diagnosis not present

## 2019-07-13 DIAGNOSIS — D649 Anemia, unspecified: Secondary | ICD-10-CM | POA: Diagnosis not present

## 2019-07-14 ENCOUNTER — Telehealth: Payer: Self-pay

## 2019-07-14 DIAGNOSIS — J449 Chronic obstructive pulmonary disease, unspecified: Secondary | ICD-10-CM | POA: Diagnosis not present

## 2019-07-14 DIAGNOSIS — J9621 Acute and chronic respiratory failure with hypoxia: Secondary | ICD-10-CM | POA: Diagnosis not present

## 2019-07-14 DIAGNOSIS — N186 End stage renal disease: Secondary | ICD-10-CM | POA: Diagnosis not present

## 2019-07-14 DIAGNOSIS — I5033 Acute on chronic diastolic (congestive) heart failure: Secondary | ICD-10-CM | POA: Diagnosis not present

## 2019-07-14 DIAGNOSIS — I132 Hypertensive heart and chronic kidney disease with heart failure and with stage 5 chronic kidney disease, or end stage renal disease: Secondary | ICD-10-CM | POA: Diagnosis not present

## 2019-07-14 DIAGNOSIS — E1122 Type 2 diabetes mellitus with diabetic chronic kidney disease: Secondary | ICD-10-CM | POA: Diagnosis not present

## 2019-07-14 NOTE — Telephone Encounter (Signed)
Try to call pt about amlodipine he is taking or not due to [facility] nurse called that he was out amlodipine just like clarify with pt before we send because he is on 2 other bp med and also discuss with adam we can call pt tomorrow back

## 2019-07-15 ENCOUNTER — Telehealth: Payer: Self-pay

## 2019-07-15 DIAGNOSIS — N179 Acute kidney failure, unspecified: Secondary | ICD-10-CM | POA: Diagnosis not present

## 2019-07-15 DIAGNOSIS — N183 Chronic kidney disease, stage 3 (moderate): Secondary | ICD-10-CM | POA: Diagnosis not present

## 2019-07-15 DIAGNOSIS — D649 Anemia, unspecified: Secondary | ICD-10-CM | POA: Diagnosis not present

## 2019-07-15 DIAGNOSIS — D509 Iron deficiency anemia, unspecified: Secondary | ICD-10-CM | POA: Diagnosis not present

## 2019-07-15 MED ORDER — TRAZODONE HCL 100 MG PO TABS: ORAL_TABLET | ORAL | 6 refills | Status: DC

## 2019-07-15 NOTE — Telephone Encounter (Signed)
Spoke with [name] [phone] Pt bp 160/90 and glucose non fasting 276 advised nurse keep track and continue same med as pres and please bring all med on next appt

## 2019-07-16 DIAGNOSIS — E1122 Type 2 diabetes mellitus with diabetic chronic kidney disease: Secondary | ICD-10-CM | POA: Diagnosis not present

## 2019-07-16 DIAGNOSIS — I132 Hypertensive heart and chronic kidney disease with heart failure and with stage 5 chronic kidney disease, or end stage renal disease: Secondary | ICD-10-CM | POA: Diagnosis not present

## 2019-07-16 DIAGNOSIS — J449 Chronic obstructive pulmonary disease, unspecified: Secondary | ICD-10-CM | POA: Diagnosis not present

## 2019-07-16 DIAGNOSIS — N186 End stage renal disease: Secondary | ICD-10-CM | POA: Diagnosis not present

## 2019-07-16 DIAGNOSIS — J9621 Acute and chronic respiratory failure with hypoxia: Secondary | ICD-10-CM | POA: Diagnosis not present

## 2019-07-16 DIAGNOSIS — I5033 Acute on chronic diastolic (congestive) heart failure: Secondary | ICD-10-CM | POA: Diagnosis not present

## 2019-07-17 DIAGNOSIS — D509 Iron deficiency anemia, unspecified: Secondary | ICD-10-CM | POA: Diagnosis not present

## 2019-07-17 DIAGNOSIS — N179 Acute kidney failure, unspecified: Secondary | ICD-10-CM | POA: Diagnosis not present

## 2019-07-17 DIAGNOSIS — N183 Chronic kidney disease, stage 3 (moderate): Secondary | ICD-10-CM | POA: Diagnosis not present

## 2019-07-17 DIAGNOSIS — D649 Anemia, unspecified: Secondary | ICD-10-CM | POA: Diagnosis not present

## 2019-07-20 DIAGNOSIS — D509 Iron deficiency anemia, unspecified: Secondary | ICD-10-CM | POA: Diagnosis not present

## 2019-07-20 DIAGNOSIS — N2581 Secondary hyperparathyroidism of renal origin: Secondary | ICD-10-CM | POA: Diagnosis not present

## 2019-07-20 DIAGNOSIS — N179 Acute kidney failure, unspecified: Secondary | ICD-10-CM | POA: Diagnosis not present

## 2019-07-20 DIAGNOSIS — N183 Chronic kidney disease, stage 3 (moderate): Secondary | ICD-10-CM | POA: Diagnosis not present

## 2019-07-20 DIAGNOSIS — N189 Chronic kidney disease, unspecified: Secondary | ICD-10-CM | POA: Diagnosis not present

## 2019-07-20 DIAGNOSIS — D649 Anemia, unspecified: Secondary | ICD-10-CM | POA: Diagnosis not present

## 2019-07-21 DIAGNOSIS — I132 Hypertensive heart and chronic kidney disease with heart failure and with stage 5 chronic kidney disease, or end stage renal disease: Secondary | ICD-10-CM | POA: Diagnosis not present

## 2019-07-21 DIAGNOSIS — J9621 Acute and chronic respiratory failure with hypoxia: Secondary | ICD-10-CM | POA: Diagnosis not present

## 2019-07-21 DIAGNOSIS — J449 Chronic obstructive pulmonary disease, unspecified: Secondary | ICD-10-CM | POA: Diagnosis not present

## 2019-07-21 DIAGNOSIS — I5033 Acute on chronic diastolic (congestive) heart failure: Secondary | ICD-10-CM | POA: Diagnosis not present

## 2019-07-21 DIAGNOSIS — N186 End stage renal disease: Secondary | ICD-10-CM | POA: Diagnosis not present

## 2019-07-21 DIAGNOSIS — E1122 Type 2 diabetes mellitus with diabetic chronic kidney disease: Secondary | ICD-10-CM | POA: Diagnosis not present

## 2019-07-22 ENCOUNTER — Other Ambulatory Visit: Payer: Self-pay

## 2019-07-22 DIAGNOSIS — N2581 Secondary hyperparathyroidism of renal origin: Secondary | ICD-10-CM | POA: Diagnosis not present

## 2019-07-22 DIAGNOSIS — N189 Chronic kidney disease, unspecified: Secondary | ICD-10-CM | POA: Diagnosis not present

## 2019-07-22 DIAGNOSIS — D509 Iron deficiency anemia, unspecified: Secondary | ICD-10-CM | POA: Diagnosis not present

## 2019-07-22 DIAGNOSIS — D649 Anemia, unspecified: Secondary | ICD-10-CM | POA: Diagnosis not present

## 2019-07-22 DIAGNOSIS — N183 Chronic kidney disease, stage 3 (moderate): Secondary | ICD-10-CM | POA: Diagnosis not present

## 2019-07-22 DIAGNOSIS — N179 Acute kidney failure, unspecified: Secondary | ICD-10-CM | POA: Diagnosis not present

## 2019-07-22 MED ORDER — GABAPENTIN 300 MG PO CAPS: 300.0000 mg | ORAL_CAPSULE | Freq: Two times a day (BID) | ORAL | 0 refills | Status: DC

## 2019-07-22 MED ORDER — ISOSORBIDE MONONITRATE ER 60 MG PO TB24: 60.0000 mg | ORAL_TABLET | Freq: Every day | ORAL | 3 refills | Status: DC

## 2019-07-22 MED ORDER — TAB-A-VITE PO TABS: ORAL_TABLET | ORAL | 0 refills | Status: DC

## 2019-07-22 MED ORDER — FUROSEMIDE 40 MG PO TABS: 60.0000 mg | ORAL_TABLET | Freq: Two times a day (BID) | ORAL | 3 refills | Status: DC

## 2019-07-23 ENCOUNTER — Other Ambulatory Visit: Payer: Self-pay

## 2019-07-23 DIAGNOSIS — N186 End stage renal disease: Secondary | ICD-10-CM | POA: Diagnosis not present

## 2019-07-23 DIAGNOSIS — E1122 Type 2 diabetes mellitus with diabetic chronic kidney disease: Secondary | ICD-10-CM | POA: Diagnosis not present

## 2019-07-23 DIAGNOSIS — I132 Hypertensive heart and chronic kidney disease with heart failure and with stage 5 chronic kidney disease, or end stage renal disease: Secondary | ICD-10-CM | POA: Diagnosis not present

## 2019-07-23 DIAGNOSIS — I5033 Acute on chronic diastolic (congestive) heart failure: Secondary | ICD-10-CM | POA: Diagnosis not present

## 2019-07-23 DIAGNOSIS — J449 Chronic obstructive pulmonary disease, unspecified: Secondary | ICD-10-CM | POA: Diagnosis not present

## 2019-07-23 DIAGNOSIS — J9621 Acute and chronic respiratory failure with hypoxia: Secondary | ICD-10-CM | POA: Diagnosis not present

## 2019-07-23 MED ORDER — TRIAMCINOLONE 0.1 % CREAM:EUCERIN CREAM 1:1: 1.0000 "application " | TOPICAL_CREAM | Freq: Two times a day (BID) | CUTANEOUS | 2 refills | Status: DC

## 2019-07-24 DIAGNOSIS — N183 Chronic kidney disease, stage 3 (moderate): Secondary | ICD-10-CM | POA: Diagnosis not present

## 2019-07-24 DIAGNOSIS — N2581 Secondary hyperparathyroidism of renal origin: Secondary | ICD-10-CM | POA: Diagnosis not present

## 2019-07-24 DIAGNOSIS — N179 Acute kidney failure, unspecified: Secondary | ICD-10-CM | POA: Diagnosis not present

## 2019-07-24 DIAGNOSIS — D509 Iron deficiency anemia, unspecified: Secondary | ICD-10-CM | POA: Diagnosis not present

## 2019-07-24 DIAGNOSIS — N189 Chronic kidney disease, unspecified: Secondary | ICD-10-CM | POA: Diagnosis not present

## 2019-07-24 DIAGNOSIS — D649 Anemia, unspecified: Secondary | ICD-10-CM | POA: Diagnosis not present

## 2019-07-27 DIAGNOSIS — D649 Anemia, unspecified: Secondary | ICD-10-CM | POA: Diagnosis not present

## 2019-07-27 DIAGNOSIS — D509 Iron deficiency anemia, unspecified: Secondary | ICD-10-CM | POA: Diagnosis not present

## 2019-07-27 DIAGNOSIS — N179 Acute kidney failure, unspecified: Secondary | ICD-10-CM | POA: Diagnosis not present

## 2019-07-27 DIAGNOSIS — N2581 Secondary hyperparathyroidism of renal origin: Secondary | ICD-10-CM | POA: Diagnosis not present

## 2019-07-27 DIAGNOSIS — N189 Chronic kidney disease, unspecified: Secondary | ICD-10-CM | POA: Diagnosis not present

## 2019-07-27 DIAGNOSIS — N183 Chronic kidney disease, stage 3 (moderate): Secondary | ICD-10-CM | POA: Diagnosis not present

## 2019-07-28 DIAGNOSIS — I5033 Acute on chronic diastolic (congestive) heart failure: Secondary | ICD-10-CM | POA: Diagnosis not present

## 2019-07-28 DIAGNOSIS — N186 End stage renal disease: Secondary | ICD-10-CM | POA: Diagnosis not present

## 2019-07-28 DIAGNOSIS — I132 Hypertensive heart and chronic kidney disease with heart failure and with stage 5 chronic kidney disease, or end stage renal disease: Secondary | ICD-10-CM | POA: Diagnosis not present

## 2019-07-28 DIAGNOSIS — J9621 Acute and chronic respiratory failure with hypoxia: Secondary | ICD-10-CM | POA: Diagnosis not present

## 2019-07-28 DIAGNOSIS — E1122 Type 2 diabetes mellitus with diabetic chronic kidney disease: Secondary | ICD-10-CM | POA: Diagnosis not present

## 2019-07-28 DIAGNOSIS — J449 Chronic obstructive pulmonary disease, unspecified: Secondary | ICD-10-CM | POA: Diagnosis not present

## 2019-07-29 ENCOUNTER — Other Ambulatory Visit (INDEPENDENT_AMBULATORY_CARE_PROVIDER_SITE_OTHER): Payer: Self-pay | Admitting: Nephrology

## 2019-07-29 ENCOUNTER — Telehealth: Payer: Self-pay

## 2019-07-29 ENCOUNTER — Ambulatory Visit: Payer: Medicare Other | Admitting: Adult Health

## 2019-07-29 DIAGNOSIS — Z992 Dependence on renal dialysis: Secondary | ICD-10-CM | POA: Diagnosis not present

## 2019-07-29 DIAGNOSIS — N2581 Secondary hyperparathyroidism of renal origin: Secondary | ICD-10-CM | POA: Diagnosis not present

## 2019-07-29 DIAGNOSIS — N189 Chronic kidney disease, unspecified: Secondary | ICD-10-CM | POA: Diagnosis not present

## 2019-07-29 DIAGNOSIS — Z955 Presence of coronary angioplasty implant and graft: Secondary | ICD-10-CM | POA: Diagnosis not present

## 2019-07-29 DIAGNOSIS — D509 Iron deficiency anemia, unspecified: Secondary | ICD-10-CM | POA: Diagnosis not present

## 2019-07-29 DIAGNOSIS — J9621 Acute and chronic respiratory failure with hypoxia: Secondary | ICD-10-CM | POA: Diagnosis not present

## 2019-07-29 DIAGNOSIS — G4733 Obstructive sleep apnea (adult) (pediatric): Secondary | ICD-10-CM | POA: Diagnosis not present

## 2019-07-29 DIAGNOSIS — J449 Chronic obstructive pulmonary disease, unspecified: Secondary | ICD-10-CM | POA: Diagnosis not present

## 2019-07-29 DIAGNOSIS — N186 End stage renal disease: Secondary | ICD-10-CM | POA: Diagnosis not present

## 2019-07-29 DIAGNOSIS — N179 Acute kidney failure, unspecified: Secondary | ICD-10-CM | POA: Diagnosis not present

## 2019-07-29 DIAGNOSIS — E785 Hyperlipidemia, unspecified: Secondary | ICD-10-CM | POA: Diagnosis not present

## 2019-07-29 DIAGNOSIS — E1122 Type 2 diabetes mellitus with diabetic chronic kidney disease: Secondary | ICD-10-CM | POA: Diagnosis not present

## 2019-07-29 DIAGNOSIS — Z6841 Body Mass Index (BMI) 40.0 and over, adult: Secondary | ICD-10-CM | POA: Diagnosis not present

## 2019-07-29 DIAGNOSIS — I132 Hypertensive heart and chronic kidney disease with heart failure and with stage 5 chronic kidney disease, or end stage renal disease: Secondary | ICD-10-CM | POA: Diagnosis not present

## 2019-07-29 DIAGNOSIS — N183 Chronic kidney disease, stage 3 (moderate): Secondary | ICD-10-CM | POA: Diagnosis not present

## 2019-07-29 DIAGNOSIS — L03116 Cellulitis of left lower limb: Secondary | ICD-10-CM | POA: Diagnosis not present

## 2019-07-29 DIAGNOSIS — Z7982 Long term (current) use of aspirin: Secondary | ICD-10-CM | POA: Diagnosis not present

## 2019-07-29 DIAGNOSIS — I272 Pulmonary hypertension, unspecified: Secondary | ICD-10-CM | POA: Diagnosis not present

## 2019-07-29 DIAGNOSIS — I472 Ventricular tachycardia: Secondary | ICD-10-CM | POA: Diagnosis not present

## 2019-07-29 DIAGNOSIS — Z79899 Other long term (current) drug therapy: Secondary | ICD-10-CM | POA: Diagnosis not present

## 2019-07-29 DIAGNOSIS — Z9981 Dependence on supplemental oxygen: Secondary | ICD-10-CM | POA: Diagnosis not present

## 2019-07-29 DIAGNOSIS — D649 Anemia, unspecified: Secondary | ICD-10-CM | POA: Diagnosis not present

## 2019-07-29 DIAGNOSIS — I5033 Acute on chronic diastolic (congestive) heart failure: Secondary | ICD-10-CM | POA: Diagnosis not present

## 2019-07-29 NOTE — Telephone Encounter (Signed)
CMN SIGNED AND PLACED IN [facility] F[facility]. °

## 2019-07-29 NOTE — Telephone Encounter (Signed)
CMN SIGNED AND PLACED IN [facility]OL[facility]. °

## 2019-07-30 ENCOUNTER — Encounter (INDEPENDENT_AMBULATORY_CARE_PROVIDER_SITE_OTHER): Payer: Self-pay | Admitting: Vascular Surgery

## 2019-07-30 ENCOUNTER — Ambulatory Visit (INDEPENDENT_AMBULATORY_CARE_PROVIDER_SITE_OTHER): Payer: Medicare Other | Admitting: Vascular Surgery

## 2019-07-30 ENCOUNTER — Other Ambulatory Visit: Payer: Self-pay

## 2019-07-30 ENCOUNTER — Ambulatory Visit (INDEPENDENT_AMBULATORY_CARE_PROVIDER_SITE_OTHER): Payer: Medicare Other

## 2019-07-30 VITALS — BP 159/92 | HR 71 | Resp 16 | Ht 63.0 in | Wt 264.0 lb

## 2019-07-30 DIAGNOSIS — I10 Essential (primary) hypertension: Secondary | ICD-10-CM

## 2019-07-30 DIAGNOSIS — I25118 Atherosclerotic heart disease of native coronary artery with other forms of angina pectoris: Secondary | ICD-10-CM | POA: Diagnosis not present

## 2019-07-30 DIAGNOSIS — N185 Chronic kidney disease, stage 5: Secondary | ICD-10-CM | POA: Diagnosis not present

## 2019-07-30 DIAGNOSIS — E1165 Type 2 diabetes mellitus with hyperglycemia: Secondary | ICD-10-CM

## 2019-07-30 DIAGNOSIS — Z794 Long term (current) use of insulin: Secondary | ICD-10-CM | POA: Diagnosis not present

## 2019-07-30 DIAGNOSIS — N186 End stage renal disease: Secondary | ICD-10-CM

## 2019-07-31 ENCOUNTER — Other Ambulatory Visit: Payer: Self-pay | Admitting: Adult Health

## 2019-07-31 ENCOUNTER — Other Ambulatory Visit: Payer: Self-pay

## 2019-07-31 DIAGNOSIS — N189 Chronic kidney disease, unspecified: Secondary | ICD-10-CM | POA: Diagnosis not present

## 2019-07-31 DIAGNOSIS — D509 Iron deficiency anemia, unspecified: Secondary | ICD-10-CM | POA: Diagnosis not present

## 2019-07-31 DIAGNOSIS — D649 Anemia, unspecified: Secondary | ICD-10-CM | POA: Diagnosis not present

## 2019-07-31 DIAGNOSIS — N183 Chronic kidney disease, stage 3 (moderate): Secondary | ICD-10-CM | POA: Diagnosis not present

## 2019-07-31 DIAGNOSIS — N2581 Secondary hyperparathyroidism of renal origin: Secondary | ICD-10-CM | POA: Diagnosis not present

## 2019-07-31 DIAGNOSIS — N179 Acute kidney failure, unspecified: Secondary | ICD-10-CM | POA: Diagnosis not present

## 2019-07-31 MED ORDER — TRIAMCINOLONE 0.1 % CREAM:EUCERIN CREAM 1:1: 1.0000 "application " | TOPICAL_CREAM | Freq: Two times a day (BID) | CUTANEOUS | 2 refills | Status: DC

## 2019-08-02 NOTE — Progress Notes (Signed)
MRN : [id number]  [name] is a 53 y.o. ([birth year]) male who presents with chief complaint of  Chief Complaint  Patient presents with   New Patient (Initial Visit)    ref Candiss Norse for vein mapping  .  History of Present Illness:   The patient is seen for evaluation for dialysis access. The patient has chronic renal insufficiency stage V secondary to hypertension and diabetes. The patient's most recent creatinine clearance is less than 20. The patient volume status has not yet become an issue. Patient's blood pressures been relatively well controlled. There are mild uremic symptoms which appear to be relatively well tolerated at this time. The patient is right-handed.  The patient has been considering the various methods of dialysis and wishes to proceed with hemodialysis and therefore creation of AV access.  The patient denies amaurosis fugax or recent TIA symptoms. There are no recent neurological changes noted. The patient denies claudication symptoms or rest pain symptoms. The patient denies history of DVT, PE or superficial thrombophlebitis. The patient denies recent episodes of angina or shortness of breath.   Venous mapping shows the right cephalic vein is his best vein.  Current Meds  Medication Sig   albuterol (PROVENTIL HFA;VENTOLIN HFA) 108 (90 Base) MCG/ACT inhaler Inhale 2 puffs into the lungs every 4 (four) hours as needed for wheezing or shortness of breath.   allopurinol (ZYLOPRIM) 300 MG tablet Take 1 tablet (300 mg total) by mouth daily.   amLODipine (NORVASC) 5 MG tablet Take 5 mg by mouth daily.    aspirin EC 81 MG tablet Take 1 tablet (81 mg total) by mouth daily.   atorvastatin (LIPITOR) 40 MG tablet Take 1 tablet (40 mg total) by mouth at bedtime.   carvedilol (COREG) 25 MG tablet Take 1 tablet (25 mg total) by mouth every 12 (twelve) hours.   Cholecalciferol (D3-1000) 25 MCG (1000 UT) tablet Take 2 tablets (2,000 Units total) by mouth daily.     Dulaglutide (TRULICITY) 1.5 UK/0.2RK SOPN Use as directed once a week diag e11.65   furosemide (LASIX) 40 MG tablet Take 1.5 tablets (60 mg total) by mouth 2 (two) times daily.   gabapentin (NEURONTIN) 300 MG capsule Take 1 capsule (300 mg total) by mouth 2 (two) times daily.   GAVILAX 17 GM/SCOOP powder    hydrALAZINE (APRESOLINE) 25 MG tablet Take one tab po bid for blood pressure   insulin degludec (TRESIBA FLEXTOUCH) 100 UNIT/ML SOPN FlexTouch Pen Inject 8 units at bed time   isosorbide mononitrate (IMDUR) 60 MG 24 hr tablet Take 1 tablet (60 mg total) by mouth daily.   magnesium oxide (MAG-OX) 400 (241.3 Mg) MG tablet    Multiple Vitamins-Minerals (TAB-A-VITE) TABS Take 1 tab po daily   multivitamin (RENA-VIT) TABS tablet Take 1 tablet by mouth at bedtime.   Nutritional Supplements (FEEDING SUPPLEMENT, NEPRO CARB STEADY,) LIQD Take 237 mLs by mouth 2 (two) times daily between meals.   omeprazole (PRILOSEC) 20 MG capsule Take 1 capsule (20 mg total) by mouth 2 (two) times daily before a meal.   SKYRIZI, 150 MG DOSE, 75 MG/0.83ML PSKT    traZODone (DESYREL) 100 MG tablet Take one or one and half tab at night for sleep   umeclidinium-vilanterol (ANORO ELLIPTA) 62.5-25 MCG/INH AEPB Inhale 1 puff into the lungs daily.   [DISCONTINUED] Triamcinolone Acetonide (TRIAMCINOLONE 0.1 % CREAM : EUCERIN) CREA Apply 1 application topically 2 (two) times daily.    Past Medical History:  Diagnosis Date  Bell's palsy    CHF (congestive heart failure) ([facility])    Chronic kidney disease    Diabetes mellitus without complication ([facility])    Hypertension    NSVT (nonsustained ventricular tachycardia) ([facility])    Obstructive sleep apnea    Psoriasis    Pulmonary HTN ([facility])     Past Surgical History:  Procedure Laterality Date   CORONARY ANGIOPLASTY WITH STENT PLACEMENT     DIALYSIS/PERMA CATHETER INSERTION N/A 06/22/2019   Procedure: DIALYSIS/PERMA CATHETER INSERTION;  Surgeon:  Algernon Huxley, MD;  Location: [facility];  Service: Cardiovascular;  Laterality: N/A;   NO PAST SURGERIES      Social History Social History   Tobacco Use   Smoking status: Never Smoker   Smokeless tobacco: Never Used  Substance Use Topics   Alcohol use: Not Currently   Drug use: No    Family History Family History  Problem Relation Age of Onset   Heart failure Mother    Kidney failure Brother   No family history of bleeding/clotting disorders, porphyria or autoimmune disease   Allergies  Allergen Reactions   Shellfish Allergy Anaphylaxis    Face and throat swelling, difficulty breathing Allergy can be triggered by touching (contact)     REVIEW OF SYSTEMS (Negative unless checked)  Constitutional: [] Weight loss  [] Fever  [] Chills Cardiac: [] Chest pain   [] Chest pressure   [] Palpitations   [] Shortness of breath when laying flat   [] Shortness of breath with exertion. Vascular:  [] Pain in legs with walking   [] Pain in legs at rest  [] History of DVT   [] Phlebitis   [] Swelling in legs   [] Varicose veins   [] Non-healing ulcers Pulmonary:   [] Uses home oxygen   [] Productive cough   [] Hemoptysis   [] Wheeze  [] COPD   [] Asthma Neurologic:  [] Dizziness   [] Seizures   [] History of stroke   [] History of TIA  [] Aphasia   [] Vissual changes   [] Weakness or numbness in arm   [] Weakness or numbness in leg Musculoskeletal:   [] Joint swelling   [] Joint pain   [] Low back pain Hematologic:  [] Easy bruising  [] Easy bleeding   [] Hypercoagulable state   [] Anemic Gastrointestinal:  [] Diarrhea   [] Vomiting  [] Gastroesophageal reflux/heartburn   [] Difficulty swallowing. Genitourinary:  [x] Chronic kidney disease   [] Difficult urination  [] Frequent urination   [] Blood in urine Skin:  [] Rashes   [] Ulcers  Psychological:  [] History of anxiety   []  History of major depression.  Physical Examination  Vitals:   07/30/19 1551  BP: (!) 159/92  Pulse: 71  Resp: 16  Weight: 264 lb  (119.7 kg)  Height: 5\' 3"  (1.6 m)   Body mass index is 46.77 kg/m. Gen: WD/WN, NAD Head: [place]/AT, No temporalis wasting.  Ear/Nose/Throat: Hearing grossly intact, nares w/o erythema or drainage, poor dentition Eyes: PER, EOMI, sclera nonicteric.  Neck: Supple, no masses.  No bruit or JVD.  Pulmonary:  Good air movement, clear to auscultation bilaterally, no use of accessory muscles.  Cardiac: RRR, normal S1, S2, no Murmurs. Vascular: Vessel Right Left  Radial Palpable Palpable  Brachial Palpable Palpable  Carotid Palpable Palpable  Gastrointestinal: soft, non-distended. No guarding/no peritoneal signs.  Musculoskeletal: M/S 5/5 throughout.  No deformity or atrophy.  Neurologic: CN 2-12 intact. Pain and light touch intact in extremities.  Symmetrical.  Speech is fluent. Motor exam as listed above. Psychiatric: Judgment intact, Mood & affect appropriate for pt's clinical situation. Dermatologic: No rashes or ulcers noted.  No changes consistent with  cellulitis. Lymph : No Cervical lymphadenopathy, no lichenification or skin changes of chronic lymphedema.  CBC Lab Results  Component Value Date   WBC 11.1 (H) 06/25/2019   HGB 8.4 (L) 06/25/2019   HCT 28.8 (L) 06/25/2019   MCV 79.8 (L) 06/25/2019   PLT 143 (L) 06/25/2019    BMET    Component Value Date/Time   NA 138 06/25/2019 0301   NA 142 04/21/2019 1334   K 3.8 06/25/2019 0301   CL 99 06/25/2019 0301   CO2 30 06/25/2019 0301   GLUCOSE 152 (H) 06/25/2019 0301   BUN 20 06/25/2019 0301   BUN 21 04/21/2019 1334   CREATININE 4.73 (H) 06/25/2019 0301   CALCIUM 8.5 (L) 06/25/2019 0301   GFRNONAA 13 (L) 06/25/2019 0301   GFRAA 15 (L) 06/25/2019 0301   CrCl cannot be calculated (Patient's most recent lab result is older than the maximum 21 days allowed.).  COAG Lab Results  Component Value Date   INR 1.1 06/15/2019   INR 1.1 02/18/2019    Radiology Vas [place] Upper Extremity Arterial Duplex  Result Date: 07/30/2019 UPPER  EXTREMITY DUPLEX STUDY Indications: Esrd.  Performing Technologist: Concha Norway RVT  Examination Guidelines: A complete evaluation includes B-mode imaging, spectral Doppler, color Doppler, and power Doppler as needed of all accessible portions of each vessel. Bilateral testing is considered an integral part of a complete examination. Limited examinations for reoccurring indications may be performed as noted.  Right Pre-Dialysis Findings: +-----------------------+----------+--------------------+---------+--------+  Location                PSV (cm/s) Intralum. Diam. (cm) Waveform  Comments  +-----------------------+----------+--------------------+---------+--------+  Brachial Antecub. fossa 68         0.51                 triphasic           +-----------------------+----------+--------------------+---------+--------+  Radial Art at Wrist     49         0.26                 triphasic           +-----------------------+----------+--------------------+---------+--------+  Ulnar Art at Wrist      75         0.23                 biphasic            +-----------------------+----------+--------------------+---------+--------+ Left Pre-Dialysis Findings: +-----------------------+----------+--------------------+---------+--------+  Location                PSV (cm/s) Intralum. Diam. (cm) Waveform  Comments  +-----------------------+----------+--------------------+---------+--------+  Brachial Antecub. fossa 86         0.54                 triphasic           +-----------------------+----------+--------------------+---------+--------+  Radial Art at Wrist     41         0.26                 triphasic           +-----------------------+----------+--------------------+---------+--------+  Ulnar Art at Wrist      40         0.24                 triphasic           +-----------------------+----------+--------------------+---------+--------+  Summary:  Right: No obstruction visualized in the right upper extremity brach  vn lat  = .29cm; brach vn m - .32; perf = .28; uv m = .37; uv        l = .21 ; ua prx .34. Left: No obstruction visualized in the left upper extremity brach vn       lat = .24cm; brach vn m - .34; perf = .36; uv m = .24; uv l =       .41 ; ua prx .47. *See table(s) above for measurements and observations. Electronically signed by [name] MD on 07/30/2019 at 5:01:22 PM.    Final    Vas [place] Upper Extremity Vein Mapping  Result Date: 07/30/2019 UPPER EXTREMITY VEIN MAPPING  Indications: Pre-access. History: Esrd.  Performing Technologist: Concha Norway RVT  Examination Guidelines: A complete evaluation includes B-mode imaging, spectral Doppler, color Doppler, and power Doppler as needed of all accessible portions of each vessel. Bilateral testing is considered an integral part of a complete examination. Limited examinations for reoccurring indications may be performed as noted. +-----------------+-------------+----------+--------+  Right Cephalic    Diameter (cm) Depth (cm) Findings  +-----------------+-------------+----------+--------+  Prox upper arm        0.38                           +-----------------+-------------+----------+--------+  Mid upper arm         0.33                           +-----------------+-------------+----------+--------+  Dist upper arm        0.30                           +-----------------+-------------+----------+--------+  Antecubital fossa     0.45                           +-----------------+-------------+----------+--------+  Prox forearm          0.42                           +-----------------+-------------+----------+--------+  Mid forearm           0.29                           +-----------------+-------------+----------+--------+  Dist forearm          0.21                           +-----------------+-------------+----------+--------+ +-----------------+-------------+----------+---------+  Right Basilic     Diameter (cm) Depth (cm) Findings    +-----------------+-------------+----------+---------+  Prox upper arm        0.34                            +-----------------+-------------+----------+---------+  Mid upper arm         0.40                 branching  +-----------------+-------------+----------+---------+  Dist upper arm        0.32                            +-----------------+-------------+----------+---------+  Antecubital fossa     0.31                            +-----------------+-------------+----------+---------+  Prox forearm          0.33                            +-----------------+-------------+----------+---------+ +-----------------+-------------+----------+---------+  Left Cephalic     Diameter (cm) Depth (cm) Findings   +-----------------+-------------+----------+---------+  Prox upper arm        0.32                            +-----------------+-------------+----------+---------+  Mid upper arm         0.26                            +-----------------+-------------+----------+---------+  Dist upper arm        0.30                 branching  +-----------------+-------------+----------+---------+  Antecubital fossa     0.25                            +-----------------+-------------+----------+---------+  Prox forearm          0.25                            +-----------------+-------------+----------+---------+  Mid forearm           0.26                            +-----------------+-------------+----------+---------+  Dist forearm          0.22                            +-----------------+-------------+----------+---------+ +-----------------+-------------+----------+---------+  Left Basilic      Diameter (cm) Depth (cm) Findings   +-----------------+-------------+----------+---------+  Prox upper arm        0.32                            +-----------------+-------------+----------+---------+  Mid upper arm         0.32                 branching  +-----------------+-------------+----------+---------+  Dist upper arm         0.25                            +-----------------+-------------+----------+---------+  Antecubital fossa     0.28                            +-----------------+-------------+----------+---------+  Prox forearm          0.26                            +-----------------+-------------+----------+---------+ Summary: Right: Compressible vessels. Left: Compressible vessels. *See table(s) above for measurements and observations.  Diagnosing physician: [name] MD Electronically signed by [name] MD on 07/30/2019 at 5:01:19 PM.    Final      Assessment/Plan 1. Chronic renal insufficiency, stage 5 (HCC) Recommend:  At this time the patient does not have appropriate extremity access for dialysis  Patient  should have a right brachial cephalic fistula created.  The risks, benefits and alternative therapies were reviewed in detail with the patient.  All questions were answered.  The patient agrees to proceed with surgery.    2. Type 2 diabetes mellitus with hyperglycemia, with long-term current use of insulin (HCC) Continue hypoglycemic medications as already ordered, these medications have been reviewed and there are no changes at this time.  Hgb A1C to be monitored as already arranged by primary service   3. Essential hypertension Continue antihypertensive medications as already ordered, these medications have been reviewed and there are no changes at this time.   4. Coronary artery disease of native artery of native heart with stable angina pectoris ([facility]CC) Continue cardiac and antihypertensive medications as already ordered and reviewed, no changes at this time.  Continue statin as ordered and reviewed, no changes at this time  Nitrates PRN for chest pain     [name], MD  08/02/2019 9:33 PM

## 2019-08-03 DIAGNOSIS — D649 Anemia, unspecified: Secondary | ICD-10-CM | POA: Diagnosis not present

## 2019-08-03 DIAGNOSIS — D509 Iron deficiency anemia, unspecified: Secondary | ICD-10-CM | POA: Diagnosis not present

## 2019-08-03 DIAGNOSIS — N189 Chronic kidney disease, unspecified: Secondary | ICD-10-CM | POA: Diagnosis not present

## 2019-08-03 DIAGNOSIS — N183 Chronic kidney disease, stage 3 (moderate): Secondary | ICD-10-CM | POA: Diagnosis not present

## 2019-08-03 DIAGNOSIS — N2581 Secondary hyperparathyroidism of renal origin: Secondary | ICD-10-CM | POA: Diagnosis not present

## 2019-08-03 DIAGNOSIS — N179 Acute kidney failure, unspecified: Secondary | ICD-10-CM | POA: Diagnosis not present

## 2019-08-04 ENCOUNTER — Telehealth: Payer: Self-pay

## 2019-08-04 NOTE — Telephone Encounter (Signed)
CMN SIGNED AND PLACED IN [facility] F[facility].

## 2019-08-05 ENCOUNTER — Other Ambulatory Visit: Payer: Self-pay

## 2019-08-05 ENCOUNTER — Telehealth (INDEPENDENT_AMBULATORY_CARE_PROVIDER_SITE_OTHER): Payer: Self-pay | Admitting: Vascular Surgery

## 2019-08-05 DIAGNOSIS — D649 Anemia, unspecified: Secondary | ICD-10-CM | POA: Diagnosis not present

## 2019-08-05 DIAGNOSIS — N2581 Secondary hyperparathyroidism of renal origin: Secondary | ICD-10-CM | POA: Diagnosis not present

## 2019-08-05 DIAGNOSIS — N189 Chronic kidney disease, unspecified: Secondary | ICD-10-CM | POA: Diagnosis not present

## 2019-08-05 DIAGNOSIS — N179 Acute kidney failure, unspecified: Secondary | ICD-10-CM | POA: Diagnosis not present

## 2019-08-05 DIAGNOSIS — D509 Iron deficiency anemia, unspecified: Secondary | ICD-10-CM | POA: Diagnosis not present

## 2019-08-05 DIAGNOSIS — N183 Chronic kidney disease, stage 3 (moderate): Secondary | ICD-10-CM | POA: Diagnosis not present

## 2019-08-05 MED ORDER — TRIAMCINOLONE 0.1 % CREAM:EUCERIN CREAM 1:1: 1.0000 "application " | TOPICAL_CREAM | Freq: Two times a day (BID) | CUTANEOUS | 5 refills | Status: DC

## 2019-08-05 MED ORDER — TRIAMCINOLONE 0.1 % CREAM:EUCERIN CREAM 1:1: 1.0000 "application " | TOPICAL_CREAM | Freq: Two times a day (BID) | CUTANEOUS | 3 refills | Status: DC

## 2019-08-05 NOTE — Telephone Encounter (Signed)
Patient called asking why we hadnt scheduled surgery. I advised patient that he had been referred out to a Cardiologist in order to obtain cardiac clearance. As soon as patient is cleared we will move forward with surgery. Patient verbalized understanding. AS, CMA

## 2019-08-06 DIAGNOSIS — L400 Psoriasis vulgaris: Secondary | ICD-10-CM | POA: Diagnosis not present

## 2019-08-07 DIAGNOSIS — N2581 Secondary hyperparathyroidism of renal origin: Secondary | ICD-10-CM | POA: Diagnosis not present

## 2019-08-07 DIAGNOSIS — D509 Iron deficiency anemia, unspecified: Secondary | ICD-10-CM | POA: Diagnosis not present

## 2019-08-07 DIAGNOSIS — N179 Acute kidney failure, unspecified: Secondary | ICD-10-CM | POA: Diagnosis not present

## 2019-08-07 DIAGNOSIS — D649 Anemia, unspecified: Secondary | ICD-10-CM | POA: Diagnosis not present

## 2019-08-07 DIAGNOSIS — N183 Chronic kidney disease, stage 3 (moderate): Secondary | ICD-10-CM | POA: Diagnosis not present

## 2019-08-07 DIAGNOSIS — N189 Chronic kidney disease, unspecified: Secondary | ICD-10-CM | POA: Diagnosis not present

## 2019-08-10 DIAGNOSIS — N2581 Secondary hyperparathyroidism of renal origin: Secondary | ICD-10-CM | POA: Diagnosis not present

## 2019-08-10 DIAGNOSIS — N179 Acute kidney failure, unspecified: Secondary | ICD-10-CM | POA: Diagnosis not present

## 2019-08-10 DIAGNOSIS — D509 Iron deficiency anemia, unspecified: Secondary | ICD-10-CM | POA: Diagnosis not present

## 2019-08-10 DIAGNOSIS — N189 Chronic kidney disease, unspecified: Secondary | ICD-10-CM | POA: Diagnosis not present

## 2019-08-10 DIAGNOSIS — D649 Anemia, unspecified: Secondary | ICD-10-CM | POA: Diagnosis not present

## 2019-08-10 DIAGNOSIS — N183 Chronic kidney disease, stage 3 (moderate): Secondary | ICD-10-CM | POA: Diagnosis not present

## 2019-08-11 ENCOUNTER — Encounter: Payer: Self-pay | Admitting: Adult Health

## 2019-08-11 ENCOUNTER — Other Ambulatory Visit: Payer: Self-pay

## 2019-08-11 ENCOUNTER — Ambulatory Visit (INDEPENDENT_AMBULATORY_CARE_PROVIDER_SITE_OTHER): Payer: Medicare Other | Admitting: Adult Health

## 2019-08-11 VITALS — BP 146/99 | HR 76 | Resp 16 | Ht 63.0 in | Wt 263.0 lb

## 2019-08-11 DIAGNOSIS — N183 Chronic kidney disease, stage 3 unspecified: Secondary | ICD-10-CM

## 2019-08-11 DIAGNOSIS — Z0001 Encounter for general adult medical examination with abnormal findings: Secondary | ICD-10-CM

## 2019-08-11 DIAGNOSIS — Z9981 Dependence on supplemental oxygen: Secondary | ICD-10-CM

## 2019-08-11 DIAGNOSIS — G4733 Obstructive sleep apnea (adult) (pediatric): Secondary | ICD-10-CM | POA: Diagnosis not present

## 2019-08-11 DIAGNOSIS — R300 Dysuria: Secondary | ICD-10-CM | POA: Diagnosis not present

## 2019-08-11 DIAGNOSIS — I10 Essential (primary) hypertension: Secondary | ICD-10-CM

## 2019-08-11 DIAGNOSIS — E1165 Type 2 diabetes mellitus with hyperglycemia: Secondary | ICD-10-CM

## 2019-08-11 DIAGNOSIS — Z794 Long term (current) use of insulin: Secondary | ICD-10-CM

## 2019-08-11 DIAGNOSIS — R1901 Right upper quadrant abdominal swelling, mass and lump: Secondary | ICD-10-CM

## 2019-08-11 DIAGNOSIS — N429 Disorder of prostate, unspecified: Secondary | ICD-10-CM

## 2019-08-11 NOTE — Progress Notes (Signed)
[facility] [place], [place] [address]  Internal MEDICINE  Office Visit Note  Patient Name: [name]  [id number]  [id number]  Date of Service: 08/11/2019  Chief Complaint  Patient presents with  . Medical Management of Chronic Issues  . Annual Exam    medicare annual wellness visit   . Diabetes    would like referral to endocrinologist for diabetic pod  a1c in hospital one month ago was 14.7   . Hypertension    elevated bp   . Quality Metric Gaps    colonoscopy      HPI Pt is here for routine health maintenance examination. Pt overall is doing fair.  He has a vas-cath in his right chest that was placed during his last  Hospital admission.  He is now doing dialysis MWF, and he is awaiting cardiology clearance so that vascular can place a fistula for him. He is requesting a referral to endocrinology to help him manage his DM, and he is interested in getting a POD to help with his glucose monitoring.  He is also in need of a colonoscopy, however he would like to get his vascular surgery and dialysis going before he commits to this.   Current Medication: Outpatient Encounter Medications as of 08/11/2019  Medication Sig  . albuterol (PROVENTIL HFA;VENTOLIN HFA) 108 (90 Base) MCG/ACT inhaler Inhale 2 puffs into the lungs every 4 (four) hours as needed for wheezing or shortness of breath.  . allopurinol (ZYLOPRIM) 300 MG tablet Take 1 tablet (300 mg total) by mouth daily.  [name] Kitchen amLODipine (NORVASC) 5 MG tablet Take 5 mg by mouth daily.   [name] Kitchen aspirin EC 81 MG tablet Take 1 tablet (81 mg total) by mouth daily.  [name] Kitchen atorvastatin (LIPITOR) 40 MG tablet Take 1 tablet (40 mg total) by mouth at bedtime.  . carvedilol (COREG) 25 MG tablet Take 1 tablet (25 mg total) by mouth every 12 (twelve) hours.  . Cholecalciferol (D3-1000) 25 MCG (1000 UT) tablet Take 2 tablets (2,000 Units total) by mouth daily.  . Dulaglutide (TRULICITY) 1.5 LY/6.5KP SOPN Use as directed once  a week diag e11.65  . furosemide (LASIX) 40 MG tablet Take 1.5 tablets (60 mg total) by mouth 2 (two) times daily.  [name] Kitchen gabapentin (NEURONTIN) 300 MG capsule Take 1 capsule (300 mg total) by mouth 2 (two) times daily.  [name] Kitchen GAVILAX 17 GM/SCOOP powder   . hydrALAZINE (APRESOLINE) 25 MG tablet Take one tab po bid for blood pressure  . insulin degludec (TRESIBA FLEXTOUCH) 100 UNIT/ML SOPN FlexTouch Pen Inject 8 units at bed time  . isosorbide mononitrate (IMDUR) 60 MG 24 hr tablet Take 1 tablet (60 mg total) by mouth daily.  . magnesium oxide (MAG-OX) 400 (241.3 Mg) MG tablet   . Multiple Vitamins-Minerals (TAB-A-VITE) TABS Take 1 tab po daily  . multivitamin (RENA-VIT) TABS tablet Take 1 tablet by mouth at bedtime.  . Nutritional Supplements (FEEDING SUPPLEMENT, NEPRO CARB STEADY,) LIQD Take 237 mLs by mouth 2 (two) times daily between meals.  [name] Kitchen omeprazole (PRILOSEC) 20 MG capsule Take 1 capsule (20 mg total) by mouth 2 (two) times daily before a meal.  . SKYRIZI, 150 MG DOSE, 75 MG/0.83ML PSKT   . traZODone (DESYREL) 100 MG tablet Take one or one and half tab at night for sleep  . Triamcinolone Acetonide (TRIAMCINOLONE 0.1 % CREAM : EUCERIN) CREA Apply 1 application topically 2 (two) times daily.  [name] Kitchen umeclidinium-vilanterol (ANORO ELLIPTA) 62.5-25 MCG/INH AEPB Inhale  1 puff into the lungs daily.   No facility-administered encounter medications on file as of 08/11/2019.     Surgical History: Past Surgical History:  Procedure Laterality Date  . CORONARY ANGIOPLASTY WITH STENT PLACEMENT    . DIALYSIS/PERMA CATHETER INSERTION N/A 06/22/2019   Procedure: DIALYSIS/PERMA CATHETER INSERTION;  Surgeon: Algernon Huxley, MD;  Location: [facility];  Service: Cardiovascular;  Laterality: N/A;  . NO PAST SURGERIES      Medical History: Past Medical History:  Diagnosis Date  . Bell's palsy   . CHF (congestive heart failure) ([place])   . Chronic kidney disease   . Diabetes mellitus without complication  ([place])   . Hypertension   . NSVT (nonsustained ventricular tachycardia) ([place])   . Obstructive sleep apnea   . Psoriasis   . Pulmonary HTN ([place])     Family History: Family History  Problem Relation Age of Onset  . Heart failure Mother   . Kidney failure Brother     Review of Systems  Constitutional: Negative.  Negative for chills, fatigue and unexpected weight change.  HENT: Negative.  Negative for congestion, rhinorrhea, sneezing and sore throat.   Eyes: Negative for redness.  Respiratory: Negative.  Negative for cough, chest tightness and shortness of breath.   Cardiovascular: Negative.  Negative for chest pain and palpitations.  Gastrointestinal: Negative.  Negative for abdominal pain, constipation, diarrhea, nausea and vomiting.  Endocrine: Negative.   Genitourinary: Negative.  Negative for dysuria and frequency.  Musculoskeletal: Negative.  Negative for arthralgias, back pain, joint swelling and neck pain.  Skin: Negative.  Negative for rash.  Allergic/Immunologic: Negative.   Neurological: Negative.  Negative for tremors and numbness.  Hematological: Negative for adenopathy. Does not bruise/bleed easily.  Psychiatric/Behavioral: Negative.  Negative for behavioral problems, sleep disturbance and suicidal ideas. The patient is not nervous/anxious.      Vital Signs: BP (!) 146/99 (BP Location: Left Arm, Patient Position: Sitting, Cuff Size: Normal)   Pulse 76   Resp 16   Ht 5\' 3"  (1.6 m)   Wt 263 lb (119.3 kg)   SpO2 98%   BMI 46.59 kg/m    Physical Exam Vitals signs and nursing note reviewed.  Constitutional:      General: He is not in acute distress.    Appearance: He is well-developed. He is not diaphoretic.  HENT:     Head: Normocephalic and atraumatic.     Mouth/Throat:     Pharynx: No oropharyngeal exudate.  Eyes:     Pupils: Pupils are equal, round, and reactive to light.  Neck:     Musculoskeletal: Normal range of motion and neck supple.     Thyroid:  No thyromegaly.     Vascular: No JVD.     Trachea: No tracheal deviation.  Cardiovascular:     Rate and Rhythm: Normal rate and regular rhythm.     Heart sounds: Normal heart sounds. No murmur. No friction rub. No gallop.   Pulmonary:     Effort: Pulmonary effort is normal. No respiratory distress.     Breath sounds: Normal breath sounds. No wheezing or rales.  Chest:     Chest wall: No tenderness.  Abdominal:     Palpations: Abdomen is soft.     Tenderness: There is no abdominal tenderness. There is no guarding.  Musculoskeletal: Normal range of motion.  Lymphadenopathy:     Cervical: No cervical adenopathy.  Skin:    General: Skin is warm and dry.  Neurological:  Mental Status: He is alert and oriented to person, place, and time.     Cranial Nerves: No cranial nerve deficit.  Psychiatric:        Behavior: Behavior normal.        Thought Content: Thought content normal.        Judgment: Judgment normal.      LABS:   Assessment/Plan: 1. Encounter for general adult medical examination with abnormal findings Up to date on PHM.  - CBC with Differential/Platelet - Lipid Panel With LDL/HDL Ratio - TSH - T4, free - Comprehensive metabolic panel  2. Type 2 diabetes mellitus with hyperglycemia, with long-term current use of insulin ([place]) Referral to endocrine for DM management.  - Ambulatory referral to Endocrinology - CBC with Differential/Platelet  3. Oxygen dependent Continue 3 lpm daily  4. Obstructive sleep apnea Continue to use cpap as directed.   5. Essential hypertension Slightly elevated today, continue to monitor.  Encouraged compliance with medications.   6. Morbid obesity ([place]) Obesity Counseling: Risk Assessment: An assessment of behavioral risk factors was made today and includes lack of exercise sedentary lifestyle, lack of portion control and poor dietary habits.  Risk Modification Advice: She was counseled on portion control guidelines.  Restricting daily caloric intake to. . The detrimental long term effects of obesity on her health and ongoing poor compliance was also discussed with the patient.  7. Chronic kidney disease, stage III (moderate) (H[facility]) Continue to see nephrology as scheduled.   8. Disorder of prostate, unspecified  - PSA  9. Dysuria - UA/M w/rflx Culture, Routine  10. RUQ abdominal mass Ordered US to follow up on mass.  - US Abdomen Complete; Future  General Counseling: tyqwan pink understanding of the findings of todays visit and agrees with plan of treatment. I have discussed any further diagnostic evaluation that may be needed or ordered today. We also reviewed his medications today. he has been encouraged to call the office with any questions or concerns that should arise related to todays visit.   Orders Placed This Encounter  Procedures  . UA/M w/rflx Culture, Routine    No orders of the defined types were placed in this encounter.   Time spent: 35 Minutes   This patient was seen by Orson Gear AGNP-C in Collaboration with Dr Lavera Guise as a part of collaborative care agreement  Kendell Bane AGNP-C Internal Medicine

## 2019-08-12 ENCOUNTER — Other Ambulatory Visit: Payer: Self-pay

## 2019-08-12 ENCOUNTER — Other Ambulatory Visit: Payer: Self-pay | Admitting: Adult Health

## 2019-08-12 DIAGNOSIS — N2581 Secondary hyperparathyroidism of renal origin: Secondary | ICD-10-CM | POA: Diagnosis not present

## 2019-08-12 DIAGNOSIS — L409 Psoriasis, unspecified: Secondary | ICD-10-CM

## 2019-08-12 DIAGNOSIS — N183 Chronic kidney disease, stage 3 (moderate): Secondary | ICD-10-CM | POA: Diagnosis not present

## 2019-08-12 DIAGNOSIS — D649 Anemia, unspecified: Secondary | ICD-10-CM | POA: Diagnosis not present

## 2019-08-12 DIAGNOSIS — N189 Chronic kidney disease, unspecified: Secondary | ICD-10-CM | POA: Diagnosis not present

## 2019-08-12 DIAGNOSIS — D509 Iron deficiency anemia, unspecified: Secondary | ICD-10-CM | POA: Diagnosis not present

## 2019-08-12 DIAGNOSIS — N179 Acute kidney failure, unspecified: Secondary | ICD-10-CM | POA: Diagnosis not present

## 2019-08-12 LAB — UA/M W/RFLX CULTURE, ROUTINE
Bilirubin, UA: NEGATIVE
Ketones, UA: NEGATIVE
Leukocytes,UA: NEGATIVE
Nitrite, UA: NEGATIVE
RBC, UA: NEGATIVE
Specific Gravity, UA: 1.02 (ref 1.005–1.030)
Urobilinogen, Ur: 1 mg/dL (ref 0.2–1.0)
pH, UA: 5 (ref 5.0–7.5)

## 2019-08-12 LAB — MICROSCOPIC EXAMINATION

## 2019-08-12 MED ORDER — TRIAMCINOLONE 0.1 % CREAM:EUCERIN CREAM 1:1: 1.0000 "application " | TOPICAL_CREAM | Freq: Two times a day (BID) | CUTANEOUS | 5 refills | Status: DC

## 2019-08-12 MED ORDER — TRIAMCINOLONE ACETONIDE 0.5 % EX OINT: 1.0000 "application " | TOPICAL_OINTMENT | Freq: Two times a day (BID) | CUTANEOUS | 11 refills | Status: DC

## 2019-08-13 DIAGNOSIS — I5033 Acute on chronic diastolic (congestive) heart failure: Secondary | ICD-10-CM | POA: Diagnosis not present

## 2019-08-13 DIAGNOSIS — I132 Hypertensive heart and chronic kidney disease with heart failure and with stage 5 chronic kidney disease, or end stage renal disease: Secondary | ICD-10-CM | POA: Diagnosis not present

## 2019-08-13 DIAGNOSIS — J9621 Acute and chronic respiratory failure with hypoxia: Secondary | ICD-10-CM | POA: Diagnosis not present

## 2019-08-13 DIAGNOSIS — J449 Chronic obstructive pulmonary disease, unspecified: Secondary | ICD-10-CM | POA: Diagnosis not present

## 2019-08-13 DIAGNOSIS — N186 End stage renal disease: Secondary | ICD-10-CM | POA: Diagnosis not present

## 2019-08-13 DIAGNOSIS — E1122 Type 2 diabetes mellitus with diabetic chronic kidney disease: Secondary | ICD-10-CM | POA: Diagnosis not present

## 2019-08-14 DIAGNOSIS — D509 Iron deficiency anemia, unspecified: Secondary | ICD-10-CM | POA: Diagnosis not present

## 2019-08-14 DIAGNOSIS — N189 Chronic kidney disease, unspecified: Secondary | ICD-10-CM | POA: Diagnosis not present

## 2019-08-14 DIAGNOSIS — N179 Acute kidney failure, unspecified: Secondary | ICD-10-CM | POA: Diagnosis not present

## 2019-08-14 DIAGNOSIS — N2581 Secondary hyperparathyroidism of renal origin: Secondary | ICD-10-CM | POA: Diagnosis not present

## 2019-08-14 DIAGNOSIS — D649 Anemia, unspecified: Secondary | ICD-10-CM | POA: Diagnosis not present

## 2019-08-14 DIAGNOSIS — N183 Chronic kidney disease, stage 3 (moderate): Secondary | ICD-10-CM | POA: Diagnosis not present

## 2019-08-17 ENCOUNTER — Other Ambulatory Visit: Payer: Self-pay | Admitting: Adult Health

## 2019-08-17 DIAGNOSIS — N179 Acute kidney failure, unspecified: Secondary | ICD-10-CM | POA: Diagnosis not present

## 2019-08-17 DIAGNOSIS — N189 Chronic kidney disease, unspecified: Secondary | ICD-10-CM | POA: Diagnosis not present

## 2019-08-17 DIAGNOSIS — N2581 Secondary hyperparathyroidism of renal origin: Secondary | ICD-10-CM | POA: Diagnosis not present

## 2019-08-17 DIAGNOSIS — N183 Chronic kidney disease, stage 3 (moderate): Secondary | ICD-10-CM | POA: Diagnosis not present

## 2019-08-17 DIAGNOSIS — D509 Iron deficiency anemia, unspecified: Secondary | ICD-10-CM | POA: Diagnosis not present

## 2019-08-17 DIAGNOSIS — D649 Anemia, unspecified: Secondary | ICD-10-CM | POA: Diagnosis not present

## 2019-08-18 ENCOUNTER — Other Ambulatory Visit: Payer: Self-pay

## 2019-08-18 DIAGNOSIS — I5032 Chronic diastolic (congestive) heart failure: Secondary | ICD-10-CM

## 2019-08-18 MED ORDER — HYDRALAZINE HCL 25 MG PO TABS: ORAL_TABLET | ORAL | 3 refills | Status: DC

## 2019-08-18 MED ORDER — TRULICITY 1.5 MG/0.5ML ~~LOC~~ SOAJ: SUBCUTANEOUS | 5 refills | Status: DC

## 2019-08-19 DIAGNOSIS — N2581 Secondary hyperparathyroidism of renal origin: Secondary | ICD-10-CM | POA: Diagnosis not present

## 2019-08-19 DIAGNOSIS — N189 Chronic kidney disease, unspecified: Secondary | ICD-10-CM | POA: Diagnosis not present

## 2019-08-19 DIAGNOSIS — N179 Acute kidney failure, unspecified: Secondary | ICD-10-CM | POA: Diagnosis not present

## 2019-08-19 DIAGNOSIS — N183 Chronic kidney disease, stage 3 (moderate): Secondary | ICD-10-CM | POA: Diagnosis not present

## 2019-08-19 DIAGNOSIS — D649 Anemia, unspecified: Secondary | ICD-10-CM | POA: Diagnosis not present

## 2019-08-19 DIAGNOSIS — D509 Iron deficiency anemia, unspecified: Secondary | ICD-10-CM | POA: Diagnosis not present

## 2019-08-21 DIAGNOSIS — D649 Anemia, unspecified: Secondary | ICD-10-CM | POA: Diagnosis not present

## 2019-08-21 DIAGNOSIS — N183 Chronic kidney disease, stage 3 (moderate): Secondary | ICD-10-CM | POA: Diagnosis not present

## 2019-08-21 DIAGNOSIS — J9621 Acute and chronic respiratory failure with hypoxia: Secondary | ICD-10-CM | POA: Diagnosis not present

## 2019-08-21 DIAGNOSIS — N179 Acute kidney failure, unspecified: Secondary | ICD-10-CM | POA: Diagnosis not present

## 2019-08-21 DIAGNOSIS — J449 Chronic obstructive pulmonary disease, unspecified: Secondary | ICD-10-CM | POA: Diagnosis not present

## 2019-08-21 DIAGNOSIS — E1122 Type 2 diabetes mellitus with diabetic chronic kidney disease: Secondary | ICD-10-CM | POA: Diagnosis not present

## 2019-08-21 DIAGNOSIS — I5033 Acute on chronic diastolic (congestive) heart failure: Secondary | ICD-10-CM | POA: Diagnosis not present

## 2019-08-21 DIAGNOSIS — N2581 Secondary hyperparathyroidism of renal origin: Secondary | ICD-10-CM | POA: Diagnosis not present

## 2019-08-21 DIAGNOSIS — D509 Iron deficiency anemia, unspecified: Secondary | ICD-10-CM | POA: Diagnosis not present

## 2019-08-21 DIAGNOSIS — I132 Hypertensive heart and chronic kidney disease with heart failure and with stage 5 chronic kidney disease, or end stage renal disease: Secondary | ICD-10-CM | POA: Diagnosis not present

## 2019-08-21 DIAGNOSIS — N186 End stage renal disease: Secondary | ICD-10-CM | POA: Diagnosis not present

## 2019-08-24 DIAGNOSIS — N2581 Secondary hyperparathyroidism of renal origin: Secondary | ICD-10-CM | POA: Diagnosis not present

## 2019-08-24 DIAGNOSIS — N179 Acute kidney failure, unspecified: Secondary | ICD-10-CM | POA: Diagnosis not present

## 2019-08-24 DIAGNOSIS — N183 Chronic kidney disease, stage 3 (moderate): Secondary | ICD-10-CM | POA: Diagnosis not present

## 2019-08-24 DIAGNOSIS — D509 Iron deficiency anemia, unspecified: Secondary | ICD-10-CM | POA: Diagnosis not present

## 2019-08-24 DIAGNOSIS — D649 Anemia, unspecified: Secondary | ICD-10-CM | POA: Diagnosis not present

## 2019-08-26 DIAGNOSIS — D649 Anemia, unspecified: Secondary | ICD-10-CM | POA: Diagnosis not present

## 2019-08-26 DIAGNOSIS — N2581 Secondary hyperparathyroidism of renal origin: Secondary | ICD-10-CM | POA: Diagnosis not present

## 2019-08-26 DIAGNOSIS — N179 Acute kidney failure, unspecified: Secondary | ICD-10-CM | POA: Diagnosis not present

## 2019-08-26 DIAGNOSIS — N183 Chronic kidney disease, stage 3 (moderate): Secondary | ICD-10-CM | POA: Diagnosis not present

## 2019-08-26 DIAGNOSIS — D509 Iron deficiency anemia, unspecified: Secondary | ICD-10-CM | POA: Diagnosis not present

## 2019-08-28 DIAGNOSIS — D649 Anemia, unspecified: Secondary | ICD-10-CM | POA: Diagnosis not present

## 2019-08-28 DIAGNOSIS — N183 Chronic kidney disease, stage 3 (moderate): Secondary | ICD-10-CM | POA: Diagnosis not present

## 2019-08-28 DIAGNOSIS — D509 Iron deficiency anemia, unspecified: Secondary | ICD-10-CM | POA: Diagnosis not present

## 2019-08-28 DIAGNOSIS — N179 Acute kidney failure, unspecified: Secondary | ICD-10-CM | POA: Diagnosis not present

## 2019-08-28 DIAGNOSIS — N2581 Secondary hyperparathyroidism of renal origin: Secondary | ICD-10-CM | POA: Diagnosis not present

## 2019-08-31 DIAGNOSIS — D509 Iron deficiency anemia, unspecified: Secondary | ICD-10-CM | POA: Diagnosis not present

## 2019-08-31 DIAGNOSIS — N179 Acute kidney failure, unspecified: Secondary | ICD-10-CM | POA: Diagnosis not present

## 2019-08-31 DIAGNOSIS — D649 Anemia, unspecified: Secondary | ICD-10-CM | POA: Diagnosis not present

## 2019-08-31 DIAGNOSIS — N2581 Secondary hyperparathyroidism of renal origin: Secondary | ICD-10-CM | POA: Diagnosis not present

## 2019-08-31 DIAGNOSIS — N183 Chronic kidney disease, stage 3 (moderate): Secondary | ICD-10-CM | POA: Diagnosis not present

## 2019-09-01 ENCOUNTER — Encounter: Payer: Self-pay | Admitting: Cardiology

## 2019-09-01 ENCOUNTER — Other Ambulatory Visit: Payer: Self-pay

## 2019-09-01 ENCOUNTER — Ambulatory Visit (INDEPENDENT_AMBULATORY_CARE_PROVIDER_SITE_OTHER): Payer: Medicare Other | Admitting: Cardiology

## 2019-09-01 VITALS — BP 130/92 | HR 76 | Temp 98.2°F | Ht 63.0 in | Wt 265.2 lb

## 2019-09-01 DIAGNOSIS — I5020 Unspecified systolic (congestive) heart failure: Secondary | ICD-10-CM

## 2019-09-01 DIAGNOSIS — Z01810 Encounter for preprocedural cardiovascular examination: Secondary | ICD-10-CM | POA: Diagnosis not present

## 2019-09-01 DIAGNOSIS — I10 Essential (primary) hypertension: Secondary | ICD-10-CM | POA: Diagnosis not present

## 2019-09-01 DIAGNOSIS — I2510 Atherosclerotic heart disease of native coronary artery without angina pectoris: Secondary | ICD-10-CM | POA: Diagnosis not present

## 2019-09-01 NOTE — Progress Notes (Signed)
Cardiology Office Note:    Date:  09/01/2019   ID:  [name], DOB 02-20-1966, MRN [id number]  PCP:  Lavera Guise, MD  Cardiologist:  Kate Sable, MD  Electrophysiologist:  None   Referring MD: Lavera Guise, MD   Chief Complaint  Patient presents with  . office visit    NEW patient-clearance for dialysis procedure    History of Present Illness:    [name] is a 53 y.o. male with a hx of hypertension, diabetes, CKD, CAD with 1 stent placed([facility] 1 year ago), congestive heart failure diagnosed about a year ago, who presents for preop evaluation prior to having an AV fistula.  Patient states doing okay with no chest pains but he notes having shortness of breath on mild exertion.  He originally had lower extremity edema but has since improved since starting dialysis 3 times a week.  He denies ever smoking.  States having a stress test about a year ago on a treadmill and was told he was okay.  Does not remember the last time he had an echocardiogram.  Able to sleep on his back but requires his CPAP mask at night.  He denies hematuria, palpitations, headaches, nausea vomiting, GI bleed.  Past Medical History:  Diagnosis Date  . Bell's palsy   . CHF (congestive heart failure) ([place])   . Chronic kidney disease   . Diabetes mellitus without complication ([place])   . Hypertension   . NSVT (nonsustained ventricular tachycardia) ([place])   . Obstructive sleep apnea   . Psoriasis   . Pulmonary HTN ([place])     Past Surgical History:  Procedure Laterality Date  . CORONARY ANGIOPLASTY WITH STENT PLACEMENT    . DIALYSIS/PERMA CATHETER INSERTION N/A 06/22/2019   Procedure: DIALYSIS/PERMA CATHETER INSERTION;  Surgeon: Algernon Huxley, MD;  Location: [place] [facility];  Service: Cardiovascular;  Laterality: N/A;    Current Medications: Current Meds  Medication Sig  . albuterol (PROVENTIL HFA;VENTOLIN HFA) 108 (90 Base) MCG/ACT inhaler Inhale 2 puffs into the  lungs every 4 (four) hours as needed for wheezing or shortness of breath.  . allopurinol (ZYLOPRIM) 300 MG tablet Take 1 tablet (300 mg total) by mouth daily.  [name] Kitchen amLODipine (NORVASC) 5 MG tablet Take 5 mg by mouth daily.   [name] Kitchen aspirin EC 81 MG tablet Take 1 tablet (81 mg total) by mouth daily.  [name] Kitchen atorvastatin (LIPITOR) 40 MG tablet Take 1 tablet (40 mg total) by mouth at bedtime.  . carvedilol (COREG) 25 MG tablet Take 1 tablet (25 mg total) by mouth every 12 (twelve) hours.  . Cholecalciferol (D3-1000) 25 MCG (1000 UT) tablet Take 2 tablets (2,000 Units total) by mouth daily.  . Dulaglutide (TRULICITY) 1.5 ML/4.6TK SOPN Use as directed once a week diag e11.65  . furosemide (LASIX) 40 MG tablet Take 1.5 tablets (60 mg total) by mouth 2 (two) times daily.  [name] Kitchen gabapentin (NEURONTIN) 300 MG capsule TAKE (1) CAPSULE BY MOUTH TWICE DAILY.  [name] Kitchen GAVILAX 17 GM/SCOOP powder   . hydrALAZINE (APRESOLINE) 25 MG tablet Take one tab po bid for blood pressure  . insulin degludec (TRESIBA FLEXTOUCH) 100 UNIT/ML SOPN FlexTouch Pen Inject 8 units at bed time  . isosorbide mononitrate (IMDUR) 60 MG 24 hr tablet Take 1 tablet (60 mg total) by mouth daily.  . magnesium oxide (MAG-OX) 400 (241.3 Mg) MG tablet   . Multiple Vitamins-Minerals (TAB-A-VITE) TABS TAKE 1 TABLET BY MOUTH ONCE DAILY.  . multivitamin (  RENA-VIT) TABS tablet Take 1 tablet by mouth at bedtime.  . Nutritional Supplements (FEEDING SUPPLEMENT, NEPRO CARB STEADY,) LIQD Take 237 mLs by mouth 2 (two) times daily between meals.  [name] Kitchen omeprazole (PRILOSEC) 20 MG capsule Take 1 capsule (20 mg total) by mouth 2 (two) times daily before a meal.  . SKYRIZI, 150 MG DOSE, 75 MG/0.83ML PSKT   . traZODone (DESYREL) 100 MG tablet Take one or one and half tab at night for sleep  . Triamcinolone Acetonide (TRIAMCINOLONE 0.1 % CREAM : EUCERIN) CREA Apply 1 application topically 2 (two) times daily.  [name] Kitchen triamcinolone ointment (KENALOG) 0.5 % Apply 1 application topically 2  (two) times daily.  [name] Kitchen umeclidinium-vilanterol (ANORO ELLIPTA) 62.5-25 MCG/INH AEPB Inhale 1 puff into the lungs daily.     Allergies:   Shellfish allergy   Social History   Socioeconomic History  . Marital status: Single    Spouse name: Not on file  . Number of children: Not on file  . Years of education: Not on file  . Highest education level: Not on file  Occupational History  . Not on file  Social Needs  . Financial resource strain: Somewhat hard  . Food insecurity    Worry: Often true    Inability: Often true  . Transportation needs    Medical: Yes    Non-medical: Yes  Tobacco Use  . Smoking status: Never Smoker  . Smokeless tobacco: Never Used  Substance and Sexual Activity  . Alcohol use: Not Currently  . Drug use: No  . Sexual activity: Not on file  Lifestyle  . Physical activity    Days per week: 0 days    Minutes per session: 0 min  . Stress: Rather much  Relationships  . Social Herbalist on phone: Not on file    Gets together: Not on file    Attends religious service: Not on file    Active member of club or organization: Not on file    Attends meetings of clubs or organizations: Not on file    Relationship status: Not on file  Other Topics Concern  . Not on file  Social History Narrative  . Not on file     Family History: The patient's family history includes Heart failure in his mother; Kidney failure in his brother.  ROS:   Please see the history of present illness.     All other systems reviewed and are negative.  EKGs/Labs/Other Studies Reviewed:    The following studies were reviewed today:   EKG:  EKG is  ordered today.  The ekg ordered today demonstrates normal sinus rhythm, normal ECG.  Recent Labs: 06/15/2019: ALT 11; B Natriuretic Peptide 173.0 06/25/2019: BUN 20; Creatinine, Ser 4.73; Hemoglobin 8.4; Magnesium 1.9; Platelets 143; Potassium 3.8; Sodium 138  Recent Lipid Panel    Component Value Date/Time   CHOL 116  04/21/2019 1334   TRIG 81 04/21/2019 1334   HDL 37 (L) 04/21/2019 1334   CHOLHDL 3.7 10/08/2018 0955   LDLCALC 63 04/21/2019 1334    Physical Exam:    VS:  BP (!) 130/92 (BP Location: Right Arm, Patient Position: Sitting, Cuff Size: Large)   Pulse 76   Temp 98.2 F (36.8 C)   Ht 5\' 3"  (1.6 m)   Wt 265 lb 4 oz (120.3 kg)   SpO2 98% Comment: on 3L O2  BMI 46.99 kg/m     Wt Readings from Last 3 Encounters:  09/01/19 265 lb  4 oz (120.3 kg)  08/11/19 263 lb (119.3 kg)  07/30/19 264 lb (119.7 kg)     GEN:  Well nourished, well developed in no acute distress, nasal canula HEENT: Normal NECK: No JVD; No carotid bruits LYMPHATICS: No lymphadenopathy CARDIAC: RRR, no murmurs, rubs, gallops RESPIRATORY:  Clear to auscultation without rales, wheezing or rhonchi, creased breath sounds at bases. ABDOMEN: Soft, non-tender, non-distended MUSCULOSKELETAL:  No edema; No deformity  SKIN: Warm and dry NEUROLOGIC:  Alert and oriented x 3 PSYCHIATRIC:  Normal affect   ASSESSMENT:    The Revised Cardiac Risk Index indicates that his Perioperative Risk of Major Cardiac Event is (%): 11.  Therefore, he is at high risk for perioperative complications.   His Functional Capacity in METs is: 3.94 as indicated by the [facility] Activity Status Index (DASI). According to ACC/AHA guidelines, the patient will require a nuclear perfusion imaging test (stress test) before a decision can be made regarding surgery.   The patient will be scheduled for an echocardiogram.    1. Coronary artery disease involving native heart, angina presence unspecified, unspecified vessel or lesion type   2. Systolic heart failure, unspecified HF chronicity ([place])   3. Preprocedural cardiovascular examination   4. Essential hypertension    PLAN:     1. We will order Lexiscan myocardial perfusion imaging test as patient has a history of CAD with stent and also CHF. 2. We will obtain an echocardiogram to evaluate cardiac  function 3. Continue heart failure medical regimen as currently prescribed. 4. Blood pressures well controlled. 5. Continue aspirin, Lipitor, Coreg as prescribed for ischemic heart disease.   Medication Adjustments/Labs and Tests Ordered: Current medicines are reviewed at length with the patient today.  Concerns regarding medicines are outlined above.  Orders Placed This Encounter  Procedures  . NM Myocar Multi W/Spect W/Wall Motion / EF  . EKG 12-Lead  . ECHOCARDIOGRAM COMPLETE   No orders of the defined types were placed in this encounter.   Patient Instructions  Medication Instructions:  Your physician recommends that you continue on your current medications as directed. Please refer to the Current Medication list given to you today.  If you need a refill on your cardiac medications before your next appointment, please call your pharmacy.   Lab work: - None ordered.  If you have labs (blood work) drawn today and your tests are completely normal, you will receive your results only by: [name] Kitchen MyChart Message (if you have MyChart) OR . A paper copy in the mail If you have any lab test that is abnormal or we need to change your treatment, we will call you to review the results.  Testing/Procedures: Your physician has requested that you have an echocardiogram as soon as possible for clearance. Echocardiography is a painless test that uses sound waves to create images of your heart. It provides your doctor with information about the size and shape of your heart and how well your heart's chambers and valves are working. This procedure takes approximately one hour. There are no restrictions for this procedure. You may get an IV, if needed, to receive an ultrasound enhancing agent through to better visualize your heart.    Your physician has requested that you have a lexiscan myoview as soon as possible for clearance. For further information please visit [email or web address]. Please follow  instruction sheet, as given.  [place]  Your caregiver has ordered a Stress Test with nuclear imaging. The purpose of this test is to evaluate the  blood supply to your heart muscle. This procedure is referred to as a "Non-Invasive Stress Test." This is because other than having an IV started in your vein, nothing is inserted or "invades" your body. Cardiac stress tests are done to find areas of poor blood flow to the heart by determining the extent of coronary artery disease (CAD). Some patients exercise on a treadmill, which naturally increases the blood flow to your heart, while others who are  unable to walk on a treadmill due to physical limitations have a pharmacologic/chemical stress agent called Lexiscan . This medicine will mimic walking on a treadmill by temporarily increasing your coronary blood flow.   Please note: these test may take anywhere between 2-4 hours to complete  PLEASE REPORT TO [place] AT THE FIRST DESK WILL DIRECT YOU WHERE TO GO  Date of Procedure:_____________________________________  Arrival Time for Procedure:______________________________  Instructions regarding medication:   _xx_ : Hold diabetes medication morning of procedure   PLEASE NOTIFY THE OFFICE AT LEAST 24 HOURS IN ADVANCE IF YOU ARE UNABLE TO KEEP YOUR APPOINTMENT.  [phone] AND  PLEASE NOTIFY NUCLEAR MEDICINE AT [facility] AT LEAST 24 HOURS IN ADVANCE IF YOU ARE UNABLE TO KEEP YOUR APPOINTMENT. [phone]  How to prepare for your Myoview test:  1. Do not eat or drink after midnight 2. No caffeine for 24 hours prior to test 3. No smoking 24 hours prior to test. 4. Your medication may be taken with water.  If your doctor stopped a medication because of this test, do not take that medication. 5. Pants are appropriate. Please wear a short sleeve shirt. 6. No perfume, cologne or lotion. 7. Wear comfortable walking shoes.    Follow-Up: At [facility],  you and your health needs are our priority.  As part of our continuing mission to provide you with exceptional heart care, we have created designated Provider Care Teams.  These Care Teams include your primary Cardiologist (physician) and Advanced Practice Providers (APPs -  Physician Assistants and Nurse Practitioners) who all work together to provide you with the care you need, when you need it. You will need a follow up appointment in 2-3 weeks (as soon as possible after testing completed) with Dr Garen Lah.    Cardiac Nuclear Scan A cardiac nuclear scan is a test that measures blood flow to the heart when a person is resting and when he or she is exercising. The test looks for problems such as:  Not enough blood reaching a portion of the heart.  The heart muscle not working normally. You may need this test if:  You have heart disease.  You have had abnormal lab results.  You have had heart surgery or a balloon procedure to open up blocked arteries (angioplasty).  You have chest pain.  You have shortness of breath. In this test, a radioactive dye (tracer) is injected into your bloodstream. After the tracer has traveled to your heart, an imaging device is used to measure how much of the tracer is absorbed by or distributed to various areas of your heart. This procedure is usually done at a hospital and takes 2-4 hours. Tell a health care provider about:  Any allergies you have.  All medicines you are taking, including vitamins, herbs, eye drops, creams, and over-the-counter medicines.  Any problems you or family members have had with anesthetic medicines.  Any blood disorders you have.  Any surgeries you have had.  Any medical conditions you have.  Whether you are pregnant or may be pregnant. What are the risks? Generally, this is a safe procedure. However, problems may occur, including:  Serious chest pain and heart attack. This is only a risk if the stress portion of  the test is done.  Rapid heartbeat.  Sensation of warmth in your chest. This usually passes quickly.  Allergic reaction to the tracer. What happens before the procedure?  Ask your health care provider about changing or stopping your regular medicines. This is especially important if you are taking diabetes medicines or blood thinners.  Follow instructions from your health care provider about eating or drinking restrictions.  Remove your jewelry on the day of the procedure. What happens during the procedure?  An IV will be inserted into one of your veins.  Your health care provider will inject a small amount of radioactive tracer through the IV.  You will wait for 20-40 minutes while the tracer travels through your bloodstream.  Your heart activity will be monitored with an electrocardiogram (ECG).  You will lie down on an exam table.  Images of your heart will be taken for about 15-20 minutes.  You may also have a stress test. For this test, one of the following may be done: ? You will exercise on a treadmill or stationary bike. While you exercise, your heart's activity will be monitored with an ECG, and your blood pressure will be checked. ? You will be given medicines that will increase blood flow to parts of your heart. This is done if you are unable to exercise.  When blood flow to your heart has peaked, a tracer will again be injected through the IV.  After 20-40 minutes, you will get back on the exam table and have more images taken of your heart.  Depending on the type of tracer used, scans may need to be repeated 3-4 hours later.  Your IV line will be removed when the procedure is over. The procedure may vary among health care providers and hospitals. What happens after the procedure?  Unless your health care provider tells you otherwise, you may return to your normal schedule, including diet, activities, and medicines.  Unless your health care provider tells you  otherwise, you may increase your fluid intake. This will help to flush the contrast dye from your body. Drink enough fluid to keep your urine pale yellow.  Ask your health care provider, or the department that is doing the test: ? When will my results be ready? ? How will I get my results? Summary  A cardiac nuclear scan measures the blood flow to the heart when a person is resting and when he or she is exercising.  Tell your health care provider if you are pregnant.  Before the procedure, ask your health care provider about changing or stopping your regular medicines. This is especially important if you are taking diabetes medicines or blood thinners.  After the procedure, unless your health care provider tells you otherwise, increase your fluid intake. This will help flush the contrast dye from your body.  After the procedure, unless your health care provider tells you otherwise, you may return to your normal schedule, including diet, activities, and medicines. This information is not intended to replace advice given to you by your health care provider. Make sure you discuss any questions you have with your health care provider. Document Released: 12/28/2004 Document Revised: 05/19/2018 Document Reviewed: 05/19/2018 Elsevier Patient Education  [place].     Echocardiogram  An echocardiogram is a procedure that uses painless sound waves (ultrasound) to produce an image of the heart. Images from an echocardiogram can provide important information about:  Signs of coronary artery disease (CAD).  Aneurysm detection. An aneurysm is a weak or damaged part of an artery wall that bulges out from the normal force of blood pumping through the body.  Heart size and shape. Changes in the size or shape of the heart can be associated with certain conditions, including heart failure, aneurysm, and CAD.  Heart muscle function.  Heart valve function.  Signs of a past heart attack.   Fluid buildup around the heart.  Thickening of the heart muscle.  A tumor or infectious growth around the heart valves. Tell a health care provider about:  Any allergies you have.  All medicines you are taking, including vitamins, herbs, eye drops, creams, and over-the-counter medicines.  Any blood disorders you have.  Any surgeries you have had.  Any medical conditions you have.  Whether you are pregnant or may be pregnant. What are the risks? Generally, this is a safe procedure. However, problems may occur, including:  Allergic reaction to dye (contrast) that may be used during the procedure. What happens before the procedure? No specific preparation is needed. You may eat and drink normally. What happens during the procedure?   An IV tube may be inserted into one of your veins.  You may receive contrast through this tube. A contrast is an injection that improves the quality of the pictures from your heart.  A gel will be applied to your chest.  A wand-like tool (transducer) will be moved over your chest. The gel will help to transmit the sound waves from the transducer.  The sound waves will harmlessly bounce off of your heart to allow the heart images to be captured in real-time motion. The images will be recorded on a computer. The procedure may vary among health care providers and hospitals. What happens after the procedure?  You may return to your normal, everyday life, including diet, activities, and medicines, unless your health care provider tells you not to do that. Summary  An echocardiogram is a procedure that uses painless sound waves (ultrasound) to produce an image of the heart.  Images from an echocardiogram can provide important information about the size and shape of your heart, heart muscle function, heart valve function, and fluid buildup around your heart.  You do not need to do anything to prepare before this procedure. You may eat and drink  normally.  After the echocardiogram is completed, you may return to your normal, everyday life, unless your health care provider tells you not to do that. This information is not intended to replace advice given to you by your health care provider. Make sure you discuss any questions you have with your health care provider. Document Released: 11/30/2000 Document Revised: 03/26/2019 Document Reviewed: 01/05/2017 Elsevier Patient Education  2020 [place], Kate Sable, MD  09/01/2019 11:02 AM    [place]

## 2019-09-01 NOTE — Patient Instructions (Signed)
Medication Instructions:  Your physician recommends that you continue on your current medications as directed. Please refer to the Current Medication list given to you today.  If you need a refill on your cardiac medications before your next appointment, please call your pharmacy.   Lab work: - None ordered.  If you have labs (blood work) drawn today and your tests are completely normal, you will receive your results only by:  [place] (if you have MyChart) OR  A paper copy in the mail If you have any lab test that is abnormal or we need to change your treatment, we will call you to review the results.  Testing/Procedures: Your physician has requested that you have an echocardiogram as soon as possible for clearance. Echocardiography is a painless test that uses sound waves to create images of your heart. It provides your doctor with information about the size and shape of your heart and how well your hearts chambers and valves are working. This procedure takes approximately one hour. There are no restrictions for this procedure. You may get an IV, if needed, to receive an ultrasound enhancing agent through to better visualize your heart.    Your physician has requested that you have a lexiscan myoview as soon as possible for clearance. For further information please visit [email or web address]. Please follow instruction sheet, as given.  [place]  Your caregiver has ordered a Stress Test with nuclear imaging. The purpose of this test is to evaluate the blood supply to your heart muscle. This procedure is referred to as a "Non-Invasive Stress Test." This is because other than having an IV started in your vein, nothing is inserted or "invades" your body. Cardiac stress tests are done to find areas of poor blood flow to the heart by determining the extent of coronary artery disease (CAD). Some patients exercise on a treadmill, which naturally increases the blood flow to your heart,  while others who are  unable to walk on a treadmill due to physical limitations have a pharmacologic/chemical stress agent called Lexiscan . This medicine will mimic walking on a treadmill by temporarily increasing your coronary blood flow.   Please note: these test may take anywhere between 2-4 hours to complete  PLEASE REPORT TO [facility] AT THE FIRST DESK WILL DIRECT YOU WHERE TO GO  Date of Procedure:_____________________________________  Arrival Time for Procedure:______________________________  Instructions regarding medication:   _xx_ : Hold diabetes medication morning of procedure   PLEASE NOTIFY THE OFFICE AT LEAST 24 HOURS IN ADVANCE IF YOU ARE UNABLE TO KEEP YOUR APPOINTMENT.  [phone] AND  PLEASE NOTIFY NUCLEAR MEDICINE AT [facility] AT LEAST 24 HOURS IN ADVANCE IF YOU ARE UNABLE TO KEEP YOUR APPOINTMENT. [phone]  How to prepare for your Myoview test:  1. Do not eat or drink after midnight 2. No caffeine for 24 hours prior to test 3. No smoking 24 hours prior to test. 4. Your medication may be taken with water.  If your doctor stopped a medication because of this test, do not take that medication. 5. Pants are appropriate. Please wear a short sleeve shirt. 6. No perfume, cologne or lotion. 7. Wear comfortable walking shoes.    Follow-Up: At [facility], you and your health needs are our priority.  As part of our continuing mission to provide you with exceptional heart care, we have created designated Provider Care Teams.  These Care Teams include your primary Cardiologist (physician) and Advanced Practice Providers (APPs -  Physician Assistants and Nurse Practitioners) who all work together to provide you with the care you need, when you need it. You will need a follow up appointment in 2-3 weeks (as soon as possible after testing completed) with Dr Garen Lah.    Cardiac Nuclear Scan A cardiac nuclear scan is a test that  measures blood flow to the heart when a person is resting and when he or she is exercising. The test looks for problems such as:  Not enough blood reaching a portion of the heart.  The heart muscle not working normally. You may need this test if:  You have heart disease.  You have had abnormal lab results.  You have had heart surgery or a balloon procedure to open up blocked arteries (angioplasty).  You have chest pain.  You have shortness of breath. In this test, a radioactive dye (tracer) is injected into your bloodstream. After the tracer has traveled to your heart, an imaging device is used to measure how much of the tracer is absorbed by or distributed to various areas of your heart. This procedure is usually done at a hospital and takes 2-4 hours. Tell a health care provider about:  Any allergies you have.  All medicines you are taking, including vitamins, herbs, eye drops, creams, and over-the-counter medicines.  Any problems you or family members have had with anesthetic medicines.  Any blood disorders you have.  Any surgeries you have had.  Any medical conditions you have.  Whether you are pregnant or may be pregnant. What are the risks? Generally, this is a safe procedure. However, problems may occur, including:  Serious chest pain and heart attack. This is only a risk if the stress portion of the test is done.  Rapid heartbeat.  Sensation of warmth in your chest. This usually passes quickly.  Allergic reaction to the tracer. What happens before the procedure?  Ask your health care provider about changing or stopping your regular medicines. This is especially important if you are taking diabetes medicines or blood thinners.  Follow instructions from your health care provider about eating or drinking restrictions.  Remove your jewelry on the day of the procedure. What happens during the procedure?  An IV will be inserted into one of your veins.  Your  health care provider will inject a small amount of radioactive tracer through the IV.  You will wait for 20-40 minutes while the tracer travels through your bloodstream.  Your heart activity will be monitored with an electrocardiogram (ECG).  You will lie down on an exam table.  Images of your heart will be taken for about 15-20 minutes.  You may also have a stress test. For this test, one of the following may be done: ? You will exercise on a treadmill or stationary bike. While you exercise, your heart's activity will be monitored with an ECG, and your blood pressure will be checked. ? You will be given medicines that will increase blood flow to parts of your heart. This is done if you are unable to exercise.  When blood flow to your heart has peaked, a tracer will again be injected through the IV.  After 20-40 minutes, you will get back on the exam table and have more images taken of your heart.  Depending on the type of tracer used, scans may need to be repeated 3-4 hours later.  Your IV line will be removed when the procedure is over. The procedure may vary among health care providers and  hospitals. What happens after the procedure?  Unless your health care provider tells you otherwise, you may return to your normal schedule, including diet, activities, and medicines.  Unless your health care provider tells you otherwise, you may increase your fluid intake. This will help to flush the contrast dye from your body. Drink enough fluid to keep your urine pale yellow.  Ask your health care provider, or the department that is doing the test: ? When will my results be ready? ? How will I get my results? Summary  A cardiac nuclear scan measures the blood flow to the heart when a person is resting and when he or she is exercising.  Tell your health care provider if you are pregnant.  Before the procedure, ask your health care provider about changing or stopping your regular medicines.  This is especially important if you are taking diabetes medicines or blood thinners.  After the procedure, unless your health care provider tells you otherwise, increase your fluid intake. This will help flush the contrast dye from your body.  After the procedure, unless your health care provider tells you otherwise, you may return to your normal schedule, including diet, activities, and medicines. This information is not intended to replace advice given to you by your health care provider. Make sure you discuss any questions you have with your health care provider. Document Released: 12/28/2004 Document Revised: 05/19/2018 Document Reviewed: 05/19/2018 Elsevier Patient Education  [employer].     Echocardiogram An echocardiogram is a procedure that uses painless sound waves (ultrasound) to produce an image of the heart. Images from an echocardiogram can provide important information about:  Signs of coronary artery disease (CAD).  Aneurysm detection. An aneurysm is a weak or damaged part of an artery wall that bulges out from the normal force of blood pumping through the body.  Heart size and shape. Changes in the size or shape of the heart can be associated with certain conditions, including heart failure, aneurysm, and CAD.  Heart muscle function.  Heart valve function.  Signs of a past heart attack.  Fluid buildup around the heart.  Thickening of the heart muscle.  A tumor or infectious growth around the heart valves. Tell a health care provider about:  Any allergies you have.  All medicines you are taking, including vitamins, herbs, eye drops, creams, and over-the-counter medicines.  Any blood disorders you have.  Any surgeries you have had.  Any medical conditions you have.  Whether you are pregnant or may be pregnant. What are the risks? Generally, this is a safe procedure. However, problems may occur, including:  Allergic reaction to dye (contrast) that  may be used during the procedure. What happens before the procedure? No specific preparation is needed. You may eat and drink normally. What happens during the procedure?   An IV tube may be inserted into one of your veins.  You may receive contrast through this tube. A contrast is an injection that improves the quality of the pictures from your heart.  A gel will be applied to your chest.  A wand-like tool (transducer) will be moved over your chest. The gel will help to transmit the sound waves from the transducer.  The sound waves will harmlessly bounce off of your heart to allow the heart images to be captured in real-time motion. The images will be recorded on a computer. The procedure may vary among health care providers and hospitals. What happens after the procedure?  You may return  to your normal, everyday life, including diet, activities, and medicines, unless your health care provider tells you not to do that. Summary  An echocardiogram is a procedure that uses painless sound waves (ultrasound) to produce an image of the heart.  Images from an echocardiogram can provide important information about the size and shape of your heart, heart muscle function, heart valve function, and fluid buildup around your heart.  You do not need to do anything to prepare before this procedure. You may eat and drink normally.  After the echocardiogram is completed, you may return to your normal, everyday life, unless your health care provider tells you not to do that. This information is not intended to replace advice given to you by your health care provider. Make sure you discuss any questions you have with your health care provider. Document Released: 11/30/2000 Document Revised: 03/26/2019 Document Reviewed: 01/05/2017 Elsevier Patient Education  2020 [employer].

## 2019-09-02 DIAGNOSIS — D509 Iron deficiency anemia, unspecified: Secondary | ICD-10-CM | POA: Diagnosis not present

## 2019-09-02 DIAGNOSIS — N2581 Secondary hyperparathyroidism of renal origin: Secondary | ICD-10-CM | POA: Diagnosis not present

## 2019-09-02 DIAGNOSIS — D649 Anemia, unspecified: Secondary | ICD-10-CM | POA: Diagnosis not present

## 2019-09-02 DIAGNOSIS — N183 Chronic kidney disease, stage 3 (moderate): Secondary | ICD-10-CM | POA: Diagnosis not present

## 2019-09-02 DIAGNOSIS — N179 Acute kidney failure, unspecified: Secondary | ICD-10-CM | POA: Diagnosis not present

## 2019-09-03 DIAGNOSIS — L400 Psoriasis vulgaris: Secondary | ICD-10-CM | POA: Diagnosis not present

## 2019-09-04 ENCOUNTER — Ambulatory Visit (INDEPENDENT_AMBULATORY_CARE_PROVIDER_SITE_OTHER): Payer: Medicare Other

## 2019-09-04 ENCOUNTER — Other Ambulatory Visit: Payer: Self-pay

## 2019-09-04 DIAGNOSIS — N183 Chronic kidney disease, stage 3 (moderate): Secondary | ICD-10-CM | POA: Diagnosis not present

## 2019-09-04 DIAGNOSIS — R1901 Right upper quadrant abdominal swelling, mass and lump: Secondary | ICD-10-CM

## 2019-09-04 DIAGNOSIS — N179 Acute kidney failure, unspecified: Secondary | ICD-10-CM | POA: Diagnosis not present

## 2019-09-04 DIAGNOSIS — N2581 Secondary hyperparathyroidism of renal origin: Secondary | ICD-10-CM | POA: Diagnosis not present

## 2019-09-04 DIAGNOSIS — D509 Iron deficiency anemia, unspecified: Secondary | ICD-10-CM | POA: Diagnosis not present

## 2019-09-04 DIAGNOSIS — D649 Anemia, unspecified: Secondary | ICD-10-CM | POA: Diagnosis not present

## 2019-09-07 DIAGNOSIS — N179 Acute kidney failure, unspecified: Secondary | ICD-10-CM | POA: Diagnosis not present

## 2019-09-07 DIAGNOSIS — D649 Anemia, unspecified: Secondary | ICD-10-CM | POA: Diagnosis not present

## 2019-09-07 DIAGNOSIS — N2581 Secondary hyperparathyroidism of renal origin: Secondary | ICD-10-CM | POA: Diagnosis not present

## 2019-09-07 DIAGNOSIS — N183 Chronic kidney disease, stage 3 (moderate): Secondary | ICD-10-CM | POA: Diagnosis not present

## 2019-09-07 DIAGNOSIS — D509 Iron deficiency anemia, unspecified: Secondary | ICD-10-CM | POA: Diagnosis not present

## 2019-09-08 ENCOUNTER — Other Ambulatory Visit: Payer: Self-pay

## 2019-09-08 ENCOUNTER — Encounter: Payer: Self-pay | Admitting: Adult Health

## 2019-09-08 ENCOUNTER — Ambulatory Visit (INDEPENDENT_AMBULATORY_CARE_PROVIDER_SITE_OTHER): Payer: Medicare Other | Admitting: Adult Health

## 2019-09-08 VITALS — BP 144/96 | HR 80 | Resp 16 | Ht 63.0 in | Wt 267.0 lb

## 2019-09-08 DIAGNOSIS — M25511 Pain in right shoulder: Secondary | ICD-10-CM

## 2019-09-08 DIAGNOSIS — I10 Essential (primary) hypertension: Secondary | ICD-10-CM

## 2019-09-08 DIAGNOSIS — Z9981 Dependence on supplemental oxygen: Secondary | ICD-10-CM | POA: Diagnosis not present

## 2019-09-08 DIAGNOSIS — M545 Low back pain, unspecified: Secondary | ICD-10-CM

## 2019-09-08 MED ORDER — 1ST MEDX-PATCH/ LIDOCAINE 4-0.025-5-20 % EX PTCH: 1.0000 | MEDICATED_PATCH | Freq: Every day | CUTANEOUS | 2 refills | Status: DC

## 2019-09-08 NOTE — Progress Notes (Signed)
[facility] [place] [address]  Internal MEDICINE  Office Visit Note  Patient Name: [name]  [id number]  [id number]  Date of Service: 09/08/2019  Chief Complaint  Patient presents with  . Medical Management of Chronic Issues    3 week follow up ultrasound results     HPI  Pt is here to follow up on ultrasound results. His [place] was negative for any abnormality.  He reports he is at his baseline overall.  He does report that he had some vomiting over the weekend, but that seems to have passed.  He denies any fever or other symptoms currently. His symptoms resolved within 24 hours and he still feels a little "off" but he has been able to hold food and liquids down with no nausea or issues.    Current Medication: Outpatient Encounter Medications as of 09/08/2019  Medication Sig  . albuterol (PROVENTIL HFA;VENTOLIN HFA) 108 (90 Base) MCG/ACT inhaler Inhale 2 puffs into the lungs every 4 (four) hours as needed for wheezing or shortness of breath.  . allopurinol (ZYLOPRIM) 300 MG tablet Take 1 tablet (300 mg total) by mouth daily.  [name] Kitchen amLODipine (NORVASC) 5 MG tablet Take 5 mg by mouth daily.   [name] Kitchen aspirin EC 81 MG tablet Take 1 tablet (81 mg total) by mouth daily.  [name] Kitchen atorvastatin (LIPITOR) 40 MG tablet Take 1 tablet (40 mg total) by mouth at bedtime.  . carvedilol (COREG) 25 MG tablet Take 1 tablet (25 mg total) by mouth every 12 (twelve) hours.  . Cholecalciferol (D3-1000) 25 MCG (1000 UT) tablet Take 2 tablets (2,000 Units total) by mouth daily.  . Dulaglutide (TRULICITY) 1.5 UK/0.2RK SOPN Use as directed once a week diag e11.65  . furosemide (LASIX) 40 MG tablet Take 1.5 tablets (60 mg total) by mouth 2 (two) times daily.  [name] Kitchen gabapentin (NEURONTIN) 300 MG capsule TAKE (1) CAPSULE BY MOUTH TWICE DAILY.  [name] Kitchen GAVILAX 17 GM/SCOOP powder   . hydrALAZINE (APRESOLINE) 25 MG tablet Take one tab po bid for blood pressure  . insulin degludec (TRESIBA  FLEXTOUCH) 100 UNIT/ML SOPN FlexTouch Pen Inject 8 units at bed time  . isosorbide mononitrate (IMDUR) 60 MG 24 hr tablet Take 1 tablet (60 mg total) by mouth daily.  . magnesium oxide (MAG-OX) 400 (241.3 Mg) MG tablet   . Multiple Vitamins-Minerals (TAB-A-VITE) TABS TAKE 1 TABLET BY MOUTH ONCE DAILY.  . multivitamin (RENA-VIT) TABS tablet Take 1 tablet by mouth at bedtime.  . Nutritional Supplements (FEEDING SUPPLEMENT, NEPRO CARB STEADY,) LIQD Take 237 mLs by mouth 2 (two) times daily between meals.  [name] Kitchen omeprazole (PRILOSEC) 20 MG capsule Take 1 capsule (20 mg total) by mouth 2 (two) times daily before a meal.  . SKYRIZI, 150 MG DOSE, 75 MG/0.83ML PSKT   . traZODone (DESYREL) 100 MG tablet Take one or one and half tab at night for sleep  . Triamcinolone Acetonide (TRIAMCINOLONE 0.1 % CREAM : EUCERIN) CREA Apply 1 application topically 2 (two) times daily.  [name] Kitchen triamcinolone ointment (KENALOG) 0.5 % Apply 1 application topically 2 (two) times daily.  [name] Kitchen umeclidinium-vilanterol (ANORO ELLIPTA) 62.5-25 MCG/INH AEPB Inhale 1 puff into the lungs daily.   No facility-administered encounter medications on file as of 09/08/2019.     Surgical History: Past Surgical History:  Procedure Laterality Date  . CORONARY ANGIOPLASTY WITH STENT PLACEMENT    . DIALYSIS/PERMA CATHETER INSERTION N/A 06/22/2019   Procedure: DIALYSIS/PERMA CATHETER INSERTION;  Surgeon: Algernon Huxley,  MD;  Location: [facility];  Service: Cardiovascular;  Laterality: N/A;    Medical History: Past Medical History:  Diagnosis Date  . Bell's palsy   . CHF (congestive heart failure) ([place])   . Chronic kidney disease   . Diabetes mellitus without complication ([place])   . Hypertension   . NSVT (nonsustained ventricular tachycardia) ([place])   . Obstructive sleep apnea   . Psoriasis   . Pulmonary HTN ([place])     Family History: Family History  Problem Relation Age of Onset  . Heart failure Mother   . Kidney failure Brother      Social History   Socioeconomic History  . Marital status: Single    Spouse name: Not on file  . Number of children: Not on file  . Years of education: Not on file  . Highest education level: Not on file  Occupational History  . Not on file  Social Needs  . Financial resource strain: Somewhat hard  . Food insecurity    Worry: Often true    Inability: Often true  . Transportation needs    Medical: Yes    Non-medical: Yes  Tobacco Use  . Smoking status: Never Smoker  . Smokeless tobacco: Never Used  Substance and Sexual Activity  . Alcohol use: Not Currently  . Drug use: No  . Sexual activity: Not on file  Lifestyle  . Physical activity    Days per week: 0 days    Minutes per session: 0 min  . Stress: Rather much  Relationships  . Social Herbalist on phone: Not on file    Gets together: Not on file    Attends religious service: Not on file    Active member of club or organization: Not on file    Attends meetings of clubs or organizations: Not on file    Relationship status: Not on file  . Intimate partner violence    Fear of current or ex partner: Not on file    Emotionally abused: Not on file    Physically abused: Not on file    Forced sexual activity: Not on file  Other Topics Concern  . Not on file  Social History Narrative  . Not on file      Review of Systems  Constitutional: Negative.  Negative for chills, fatigue and unexpected weight change.  HENT: Negative.  Negative for congestion, rhinorrhea, sneezing and sore throat.   Eyes: Negative for redness.  Respiratory: Negative.  Negative for cough, chest tightness and shortness of breath.   Cardiovascular: Negative.  Negative for chest pain and palpitations.  Gastrointestinal: Negative.  Negative for abdominal pain, constipation, diarrhea, nausea and vomiting.  Endocrine: Negative.   Genitourinary: Negative.  Negative for dysuria and frequency.  Musculoskeletal: Negative.  Negative for  arthralgias, back pain, joint swelling and neck pain.  Skin: Negative.  Negative for rash.  Allergic/Immunologic: Negative.   Neurological: Negative.  Negative for tremors and numbness.  Hematological: Negative for adenopathy. Does not bruise/bleed easily.  Psychiatric/Behavioral: Negative.  Negative for behavioral problems, sleep disturbance and suicidal ideas. The patient is not nervous/anxious.     Vital Signs: BP (!) 144/96   Pulse 80   Resp 16   Ht 5\' 3"  (1.6 m)   Wt 267 lb (121.1 kg)   SpO2 93% Comment: 3l  BMI 47.30 kg/m    Physical Exam Vitals signs and nursing note reviewed.  Constitutional:      General: He is  not in acute distress.    Appearance: He is well-developed. He is not diaphoretic.  HENT:     Head: Normocephalic and atraumatic.     Mouth/Throat:     Pharynx: No oropharyngeal exudate.  Eyes:     Pupils: Pupils are equal, round, and reactive to light.  Neck:     Musculoskeletal: Normal range of motion and neck supple.     Thyroid: No thyromegaly.     Vascular: No JVD.     Trachea: No tracheal deviation.  Cardiovascular:     Rate and Rhythm: Normal rate and regular rhythm.     Heart sounds: Normal heart sounds. No murmur. No friction rub. No gallop.   Pulmonary:     Effort: Pulmonary effort is normal. No respiratory distress.     Breath sounds: Normal breath sounds. No wheezing or rales.  Chest:     Chest wall: No tenderness.  Abdominal:     Palpations: Abdomen is soft.     Tenderness: There is no abdominal tenderness. There is no guarding.  Musculoskeletal: Normal range of motion.  Lymphadenopathy:     Cervical: No cervical adenopathy.  Skin:    General: Skin is warm and dry.  Neurological:     Mental Status: He is alert and oriented to person, place, and time.     Cranial Nerves: No cranial nerve deficit.  Psychiatric:        Behavior: Behavior normal.        Thought Content: Thought content normal.        Judgment: Judgment normal.     Assessment/Plan: 1. Left low back pain, unspecified chronicity, unspecified whether sciatica present Use Lidoderm patches as prescribed.   2. Acute pain of right shoulder Use Lidoderm patches as prescribed.   3. Oxygen dependent Continue to use oxygen as prescribed. currently 2 lpm  4. Essential hypertension Slightly elevated at this visit, continue to monitor.   5. Morbid obesity ([place]) Obesity Counseling: Risk Assessment: An assessment of behavioral risk factors was made today and includes lack of exercise sedentary lifestyle, lack of portion control and poor dietary habits.  Risk Modification Advice: She was counseled on portion control guidelines. Restricting daily caloric intake to. . The detrimental long term effects of obesity on her health and ongoing poor compliance was also discussed with the patient.    General Counseling: salem lembke understanding of the findings of todays visit and agrees with plan of treatment. I have discussed any further diagnostic evaluation that may be needed or ordered today. We also reviewed his medications today. he has been encouraged to call the office with any questions or concerns that should arise related to todays visit.    No orders of the defined types were placed in this encounter.   No orders of the defined types were placed in this encounter.   Time spent: 15 Minutes   This patient was seen by Orson Gear AGNP-C in Collaboration with Dr Lavera Guise as a part of collaborative care agreement     Kendell Bane AGNP-C Internal medicine

## 2019-09-09 DIAGNOSIS — N183 Chronic kidney disease, stage 3 (moderate): Secondary | ICD-10-CM | POA: Diagnosis not present

## 2019-09-09 DIAGNOSIS — N2581 Secondary hyperparathyroidism of renal origin: Secondary | ICD-10-CM | POA: Diagnosis not present

## 2019-09-09 DIAGNOSIS — D509 Iron deficiency anemia, unspecified: Secondary | ICD-10-CM | POA: Diagnosis not present

## 2019-09-09 DIAGNOSIS — D649 Anemia, unspecified: Secondary | ICD-10-CM | POA: Diagnosis not present

## 2019-09-09 DIAGNOSIS — N179 Acute kidney failure, unspecified: Secondary | ICD-10-CM | POA: Diagnosis not present

## 2019-09-10 ENCOUNTER — Encounter
Admission: RE | Admit: 2019-09-10 | Discharge: 2019-09-10 | Disposition: A | Discharge status: Home / Self Care | Payer: Medicare Other | Source: Ambulatory Visit | Attending: Cardiology | Admitting: Cardiology

## 2019-09-10 ENCOUNTER — Other Ambulatory Visit: Payer: Self-pay

## 2019-09-10 DIAGNOSIS — Z01810 Encounter for preprocedural cardiovascular examination: Secondary | ICD-10-CM | POA: Insufficient documentation

## 2019-09-10 DIAGNOSIS — I2510 Atherosclerotic heart disease of native coronary artery without angina pectoris: Secondary | ICD-10-CM | POA: Insufficient documentation

## 2019-09-10 DIAGNOSIS — I5020 Unspecified systolic (congestive) heart failure: Secondary | ICD-10-CM | POA: Insufficient documentation

## 2019-09-10 MED ORDER — REGADENOSON 0.4 MG/5ML IV SOLN
0.4000 mg | Freq: Once | INTRAVENOUS | Status: AC
  Administered 2019-09-10: 10:00:00 0.4 mg via INTRAVENOUS

## 2019-09-10 MED ORDER — TECHNETIUM TC 99M TETROFOSMIN IV KIT
10.0000 | PACK | Freq: Once | INTRAVENOUS | Status: AC | PRN
  Administered 2019-09-10: 08:00:00 10.86 via INTRAVENOUS

## 2019-09-10 MED ORDER — TECHNETIUM TC 99M TETROFOSMIN IV KIT
31.5100 | PACK | Freq: Once | INTRAVENOUS | Status: AC | PRN
  Administered 2019-09-10: 31.51 via INTRAVENOUS

## 2019-09-11 DIAGNOSIS — N2581 Secondary hyperparathyroidism of renal origin: Secondary | ICD-10-CM | POA: Diagnosis not present

## 2019-09-11 DIAGNOSIS — D649 Anemia, unspecified: Secondary | ICD-10-CM | POA: Diagnosis not present

## 2019-09-11 DIAGNOSIS — D509 Iron deficiency anemia, unspecified: Secondary | ICD-10-CM | POA: Diagnosis not present

## 2019-09-11 DIAGNOSIS — N183 Chronic kidney disease, stage 3 (moderate): Secondary | ICD-10-CM | POA: Diagnosis not present

## 2019-09-11 DIAGNOSIS — N179 Acute kidney failure, unspecified: Secondary | ICD-10-CM | POA: Diagnosis not present

## 2019-09-11 LAB — NM MYOCAR MULTI W/SPECT W/WALL MOTION / EF
Estimated workload: 1 METS
Exercise duration (min): 0 min
Exercise duration (sec): 0 s
LV dias vol: 261 mL (ref 62–150)
LV sys vol: 174 mL
MPHR: 169 {beats}/min
Peak HR: 90 {beats}/min
Percent HR: 53 %
Rest HR: 72 {beats}/min
SDS: 14
SRS: 7
SSS: 20
TID: 1.02

## 2019-09-14 ENCOUNTER — Other Ambulatory Visit: Payer: Self-pay

## 2019-09-14 ENCOUNTER — Encounter: Payer: Self-pay | Admitting: Podiatry

## 2019-09-14 ENCOUNTER — Ambulatory Visit (INDEPENDENT_AMBULATORY_CARE_PROVIDER_SITE_OTHER): Payer: Medicare Other | Admitting: Podiatry

## 2019-09-14 DIAGNOSIS — E1142 Type 2 diabetes mellitus with diabetic polyneuropathy: Secondary | ICD-10-CM | POA: Diagnosis not present

## 2019-09-14 DIAGNOSIS — N179 Acute kidney failure, unspecified: Secondary | ICD-10-CM | POA: Diagnosis not present

## 2019-09-14 DIAGNOSIS — M79676 Pain in unspecified toe(s): Secondary | ICD-10-CM | POA: Diagnosis not present

## 2019-09-14 DIAGNOSIS — I872 Venous insufficiency (chronic) (peripheral): Secondary | ICD-10-CM

## 2019-09-14 DIAGNOSIS — D649 Anemia, unspecified: Secondary | ICD-10-CM | POA: Diagnosis not present

## 2019-09-14 DIAGNOSIS — D509 Iron deficiency anemia, unspecified: Secondary | ICD-10-CM | POA: Diagnosis not present

## 2019-09-14 DIAGNOSIS — E119 Type 2 diabetes mellitus without complications: Secondary | ICD-10-CM | POA: Diagnosis not present

## 2019-09-14 DIAGNOSIS — B351 Tinea unguium: Secondary | ICD-10-CM | POA: Diagnosis not present

## 2019-09-14 DIAGNOSIS — N183 Chronic kidney disease, stage 3 (moderate): Secondary | ICD-10-CM | POA: Diagnosis not present

## 2019-09-14 DIAGNOSIS — N2581 Secondary hyperparathyroidism of renal origin: Secondary | ICD-10-CM | POA: Diagnosis not present

## 2019-09-15 ENCOUNTER — Other Ambulatory Visit: Payer: Self-pay | Admitting: Adult Health

## 2019-09-16 DIAGNOSIS — D509 Iron deficiency anemia, unspecified: Secondary | ICD-10-CM | POA: Diagnosis not present

## 2019-09-16 DIAGNOSIS — N2581 Secondary hyperparathyroidism of renal origin: Secondary | ICD-10-CM | POA: Diagnosis not present

## 2019-09-16 DIAGNOSIS — D649 Anemia, unspecified: Secondary | ICD-10-CM | POA: Diagnosis not present

## 2019-09-16 DIAGNOSIS — N179 Acute kidney failure, unspecified: Secondary | ICD-10-CM | POA: Diagnosis not present

## 2019-09-16 DIAGNOSIS — N183 Chronic kidney disease, stage 3 (moderate): Secondary | ICD-10-CM | POA: Diagnosis not present

## 2019-09-18 DIAGNOSIS — N2581 Secondary hyperparathyroidism of renal origin: Secondary | ICD-10-CM | POA: Diagnosis not present

## 2019-09-18 DIAGNOSIS — N182 Chronic kidney disease, stage 2 (mild): Secondary | ICD-10-CM | POA: Diagnosis not present

## 2019-09-18 DIAGNOSIS — N179 Acute kidney failure, unspecified: Secondary | ICD-10-CM | POA: Diagnosis not present

## 2019-09-18 DIAGNOSIS — D649 Anemia, unspecified: Secondary | ICD-10-CM | POA: Diagnosis not present

## 2019-09-18 DIAGNOSIS — Z23 Encounter for immunization: Secondary | ICD-10-CM | POA: Diagnosis not present

## 2019-09-18 DIAGNOSIS — N183 Chronic kidney disease, stage 3 unspecified: Secondary | ICD-10-CM | POA: Diagnosis not present

## 2019-09-18 DIAGNOSIS — D509 Iron deficiency anemia, unspecified: Secondary | ICD-10-CM | POA: Diagnosis not present

## 2019-09-23 DIAGNOSIS — N179 Acute kidney failure, unspecified: Secondary | ICD-10-CM | POA: Diagnosis not present

## 2019-09-23 DIAGNOSIS — Z23 Encounter for immunization: Secondary | ICD-10-CM | POA: Diagnosis not present

## 2019-09-23 DIAGNOSIS — N2581 Secondary hyperparathyroidism of renal origin: Secondary | ICD-10-CM | POA: Diagnosis not present

## 2019-09-23 DIAGNOSIS — D509 Iron deficiency anemia, unspecified: Secondary | ICD-10-CM | POA: Diagnosis not present

## 2019-09-23 DIAGNOSIS — N183 Chronic kidney disease, stage 3 unspecified: Secondary | ICD-10-CM | POA: Diagnosis not present

## 2019-09-23 DIAGNOSIS — D649 Anemia, unspecified: Secondary | ICD-10-CM | POA: Diagnosis not present

## 2019-09-25 DIAGNOSIS — Z23 Encounter for immunization: Secondary | ICD-10-CM | POA: Diagnosis not present

## 2019-09-25 DIAGNOSIS — N2581 Secondary hyperparathyroidism of renal origin: Secondary | ICD-10-CM | POA: Diagnosis not present

## 2019-09-25 DIAGNOSIS — N183 Chronic kidney disease, stage 3 unspecified: Secondary | ICD-10-CM | POA: Diagnosis not present

## 2019-09-25 DIAGNOSIS — N179 Acute kidney failure, unspecified: Secondary | ICD-10-CM | POA: Diagnosis not present

## 2019-09-25 DIAGNOSIS — D649 Anemia, unspecified: Secondary | ICD-10-CM | POA: Diagnosis not present

## 2019-09-25 DIAGNOSIS — D509 Iron deficiency anemia, unspecified: Secondary | ICD-10-CM | POA: Diagnosis not present

## 2019-09-28 DIAGNOSIS — N179 Acute kidney failure, unspecified: Secondary | ICD-10-CM | POA: Diagnosis not present

## 2019-09-28 DIAGNOSIS — N183 Chronic kidney disease, stage 3 unspecified: Secondary | ICD-10-CM | POA: Diagnosis not present

## 2019-09-28 DIAGNOSIS — N2581 Secondary hyperparathyroidism of renal origin: Secondary | ICD-10-CM | POA: Diagnosis not present

## 2019-09-28 DIAGNOSIS — D649 Anemia, unspecified: Secondary | ICD-10-CM | POA: Diagnosis not present

## 2019-09-28 DIAGNOSIS — D509 Iron deficiency anemia, unspecified: Secondary | ICD-10-CM | POA: Diagnosis not present

## 2019-09-28 DIAGNOSIS — Z23 Encounter for immunization: Secondary | ICD-10-CM | POA: Diagnosis not present

## 2019-09-29 ENCOUNTER — Other Ambulatory Visit: Payer: Self-pay

## 2019-09-29 ENCOUNTER — Ambulatory Visit (INDEPENDENT_AMBULATORY_CARE_PROVIDER_SITE_OTHER): Payer: Medicare Other

## 2019-09-29 DIAGNOSIS — I5020 Unspecified systolic (congestive) heart failure: Secondary | ICD-10-CM | POA: Diagnosis not present

## 2019-09-29 DIAGNOSIS — Z01810 Encounter for preprocedural cardiovascular examination: Secondary | ICD-10-CM

## 2019-09-29 DIAGNOSIS — I2510 Atherosclerotic heart disease of native coronary artery without angina pectoris: Secondary | ICD-10-CM

## 2019-09-30 DIAGNOSIS — N183 Chronic kidney disease, stage 3 unspecified: Secondary | ICD-10-CM | POA: Diagnosis not present

## 2019-09-30 DIAGNOSIS — Z23 Encounter for immunization: Secondary | ICD-10-CM | POA: Diagnosis not present

## 2019-09-30 DIAGNOSIS — N179 Acute kidney failure, unspecified: Secondary | ICD-10-CM | POA: Diagnosis not present

## 2019-09-30 DIAGNOSIS — D649 Anemia, unspecified: Secondary | ICD-10-CM | POA: Diagnosis not present

## 2019-09-30 DIAGNOSIS — D509 Iron deficiency anemia, unspecified: Secondary | ICD-10-CM | POA: Diagnosis not present

## 2019-09-30 DIAGNOSIS — N2581 Secondary hyperparathyroidism of renal origin: Secondary | ICD-10-CM | POA: Diagnosis not present

## 2019-10-02 DIAGNOSIS — Z23 Encounter for immunization: Secondary | ICD-10-CM | POA: Diagnosis not present

## 2019-10-02 DIAGNOSIS — N2581 Secondary hyperparathyroidism of renal origin: Secondary | ICD-10-CM | POA: Diagnosis not present

## 2019-10-02 DIAGNOSIS — D509 Iron deficiency anemia, unspecified: Secondary | ICD-10-CM | POA: Diagnosis not present

## 2019-10-02 DIAGNOSIS — D649 Anemia, unspecified: Secondary | ICD-10-CM | POA: Diagnosis not present

## 2019-10-02 DIAGNOSIS — N179 Acute kidney failure, unspecified: Secondary | ICD-10-CM | POA: Diagnosis not present

## 2019-10-02 DIAGNOSIS — N183 Chronic kidney disease, stage 3 unspecified: Secondary | ICD-10-CM | POA: Diagnosis not present

## 2019-10-05 DIAGNOSIS — Z23 Encounter for immunization: Secondary | ICD-10-CM | POA: Diagnosis not present

## 2019-10-05 DIAGNOSIS — N183 Chronic kidney disease, stage 3 unspecified: Secondary | ICD-10-CM | POA: Diagnosis not present

## 2019-10-05 DIAGNOSIS — D509 Iron deficiency anemia, unspecified: Secondary | ICD-10-CM | POA: Diagnosis not present

## 2019-10-05 DIAGNOSIS — N2581 Secondary hyperparathyroidism of renal origin: Secondary | ICD-10-CM | POA: Diagnosis not present

## 2019-10-05 DIAGNOSIS — N179 Acute kidney failure, unspecified: Secondary | ICD-10-CM | POA: Diagnosis not present

## 2019-10-05 DIAGNOSIS — D649 Anemia, unspecified: Secondary | ICD-10-CM | POA: Diagnosis not present

## 2019-10-06 ENCOUNTER — Other Ambulatory Visit: Payer: Self-pay

## 2019-10-06 ENCOUNTER — Encounter: Payer: Self-pay | Admitting: Cardiology

## 2019-10-06 ENCOUNTER — Ambulatory Visit (INDEPENDENT_AMBULATORY_CARE_PROVIDER_SITE_OTHER): Payer: Medicare Other | Admitting: Cardiology

## 2019-10-06 VITALS — BP 120/90 | HR 90 | Temp 97.3°F | Ht 63.0 in | Wt 267.8 lb

## 2019-10-06 DIAGNOSIS — I5020 Unspecified systolic (congestive) heart failure: Secondary | ICD-10-CM | POA: Diagnosis not present

## 2019-10-06 DIAGNOSIS — E1169 Type 2 diabetes mellitus with other specified complication: Secondary | ICD-10-CM | POA: Diagnosis not present

## 2019-10-06 DIAGNOSIS — I10 Essential (primary) hypertension: Secondary | ICD-10-CM

## 2019-10-06 DIAGNOSIS — Z794 Long term (current) use of insulin: Secondary | ICD-10-CM | POA: Diagnosis not present

## 2019-10-06 DIAGNOSIS — E1159 Type 2 diabetes mellitus with other circulatory complications: Secondary | ICD-10-CM | POA: Diagnosis not present

## 2019-10-06 DIAGNOSIS — Z01810 Encounter for preprocedural cardiovascular examination: Secondary | ICD-10-CM

## 2019-10-06 DIAGNOSIS — E1142 Type 2 diabetes mellitus with diabetic polyneuropathy: Secondary | ICD-10-CM | POA: Diagnosis not present

## 2019-10-06 DIAGNOSIS — E785 Hyperlipidemia, unspecified: Secondary | ICD-10-CM | POA: Diagnosis not present

## 2019-10-06 DIAGNOSIS — E1122 Type 2 diabetes mellitus with diabetic chronic kidney disease: Secondary | ICD-10-CM | POA: Diagnosis not present

## 2019-10-06 DIAGNOSIS — Z992 Dependence on renal dialysis: Secondary | ICD-10-CM | POA: Diagnosis not present

## 2019-10-06 DIAGNOSIS — I2510 Atherosclerotic heart disease of native coronary artery without angina pectoris: Secondary | ICD-10-CM

## 2019-10-06 DIAGNOSIS — N186 End stage renal disease: Secondary | ICD-10-CM | POA: Diagnosis not present

## 2019-10-06 NOTE — Progress Notes (Signed)
Cardiology Office Note:    Date:  10/06/2019   ID:  [name], DOB 07/14/1966, MRN [id number]  PCP:  Lavera Guise, MD  Cardiologist:  Kate Sable, MD  Electrophysiologist:  None    Chief Complaint  Patient presents with  . office visit    F/U-cardiac clearance for fistula    History of Present Illness:    [name] is a 53 y.o. male with a hx of hypertension, diabetes, CKD on dialysis 3 times a week, CAD with 1 stent placed([facility] 2019), congestive heart failure diagnosed 2019, who presents for follow-up.  Patient was previously seen for preop evaluation prior to having an AV fistula.  His RCRI was 11% and functional capacity was 3.94 METS as indicated by DASI.  Nuclear perfusion imaging test and echocardiogram was ordered.  Patient states doing okay with no chest pains but he notes having shortness of breath on mild exertion.  He originally had lower extremity edema but has since improved since starting dialysis 3 times a week.  He denies ever smoking.    Briefly, echo showed mildly reduced EF 45 to 50%, Lexiscan did not show any evidence for ischemia.  Detailed report below.  Past Medical History:  Diagnosis Date  . Bell's palsy   . CHF (congestive heart failure) ([place])   . Chronic kidney disease   . Diabetes mellitus without complication ([place])   . Hypertension   . NSVT (nonsustained ventricular tachycardia) ([place])   . Obstructive sleep apnea   . Psoriasis   . Pulmonary HTN ([place])     Past Surgical History:  Procedure Laterality Date  . CORONARY ANGIOPLASTY WITH STENT PLACEMENT    . DIALYSIS/PERMA CATHETER INSERTION N/A 06/22/2019   Procedure: DIALYSIS/PERMA CATHETER INSERTION;  Surgeon: Algernon Huxley, MD;  Location: [place] [facility];  Service: Cardiovascular;  Laterality: N/A;    Current Medications: Current Meds  Medication Sig  . albuterol (PROVENTIL HFA;VENTOLIN HFA) 108 (90 Base) MCG/ACT inhaler Inhale 2 puffs into the lungs  every 4 (four) hours as needed for wheezing or shortness of breath.  . allopurinol (ZYLOPRIM) 300 MG tablet Take 1 tablet (300 mg total) by mouth daily.  [name] Kitchen amLODipine (NORVASC) 5 MG tablet Take 5 mg by mouth daily.   . ASPERCREME LIDOCAINE 4 % PTCH as needed.   [name] Kitchen aspirin EC 81 MG tablet Take 1 tablet (81 mg total) by mouth daily.  [name] Kitchen atorvastatin (LIPITOR) 40 MG tablet Take 1 tablet (40 mg total) by mouth at bedtime.  . carvedilol (COREG) 25 MG tablet Take 1 tablet (25 mg total) by mouth every 12 (twelve) hours.  . Cholecalciferol (D3-1000) 25 MCG (1000 UT) tablet Take 2 tablets (2,000 Units total) by mouth daily.  . Dulaglutide (TRULICITY) 1.5 XB/2.6OM SOPN Use as directed once a week diag e11.65  . furosemide (LASIX) 40 MG tablet Take 1.5 tablets (60 mg total) by mouth 2 (two) times daily.  [name] Kitchen gabapentin (NEURONTIN) 300 MG capsule TAKE (1) CAPSULE BY MOUTH TWICE DAILY.  [name] Kitchen GAVILAX 17 GM/SCOOP powder as needed.   . hydrALAZINE (APRESOLINE) 25 MG tablet Take one tab po bid for blood pressure  . insulin degludec (TRESIBA FLEXTOUCH) 100 UNIT/ML SOPN FlexTouch Pen Inject 8 units at bed time  . isosorbide mononitrate (IMDUR) 60 MG 24 hr tablet Take 1 tablet (60 mg total) by mouth daily.  . Lido-Capsaicin-Men-Methyl Sal (1ST MEDX-PATCH/ LIDOCAINE) 4-0.025-5-20 % PTCH Apply 1 patch topically daily.  . magnesium oxide (MAG-OX) 400 (  241.3 Mg) MG tablet Take 400 mg by mouth daily.   . Multiple Vitamins-Minerals (TAB-A-VITE) TABS TAKE 1 TABLET BY MOUTH ONCE DAILY.  . multivitamin (RENA-VIT) TABS tablet Take 1 tablet by mouth at bedtime.  . Nutritional Supplements (FEEDING SUPPLEMENT, NEPRO CARB STEADY,) LIQD Take 237 mLs by mouth 2 (two) times daily between meals.  [name] Kitchen omeprazole (PRILOSEC) 20 MG capsule Take 1 capsule (20 mg total) by mouth 2 (two) times daily before a meal.  . SKYRIZI, 150 MG DOSE, 75 MG/0.83ML PSKT Q 4 months  . traZODone (DESYREL) 100 MG tablet Take one or one and half tab at night for  sleep  . Triamcinolone Acetonide (TRIAMCINOLONE 0.1 % CREAM : EUCERIN) CREA Apply 1 application topically 2 (two) times daily.  [name] Kitchen triamcinolone ointment (KENALOG) 0.5 % Apply 1 application topically 2 (two) times daily.  [name] Kitchen umeclidinium-vilanterol (ANORO ELLIPTA) 62.5-25 MCG/INH AEPB Inhale 1 puff into the lungs daily.     Allergies:   Shellfish allergy   Social History   Socioeconomic History  . Marital status: Single    Spouse name: Not on file  . Number of children: Not on file  . Years of education: Not on file  . Highest education level: Not on file  Occupational History  . Not on file  Social Needs  . Financial resource strain: Somewhat hard  . Food insecurity    Worry: Often true    Inability: Often true  . Transportation needs    Medical: Yes    Non-medical: Yes  Tobacco Use  . Smoking status: Never Smoker  . Smokeless tobacco: Never Used  Substance and Sexual Activity  . Alcohol use: Not Currently  . Drug use: No  . Sexual activity: Not on file  Lifestyle  . Physical activity    Days per week: 0 days    Minutes per session: 0 min  . Stress: Rather much  Relationships  . Social Herbalist on phone: Not on file    Gets together: Not on file    Attends religious service: Not on file    Active member of club or organization: Not on file    Attends meetings of clubs or organizations: Not on file    Relationship status: Not on file  Other Topics Concern  . Not on file  Social History Narrative  . Not on file     Family History: The patient's family history includes Heart failure in his mother; Kidney failure in his brother.  ROS:   Please see the history of present illness.     All other systems reviewed and are negative.  EKGs/Labs/Other Studies Reviewed:    The following studies were reviewed today: Echocardiogram date 09/29/2019 1. Left ventricular ejection fraction, by visual estimation, is 45 to 50%. The left ventricle has mildly  decreased function. Mildly increased left ventricular size. There is moderately increased left ventricular hypertrophy.  2. Elevated mean left atrial pressure.  3. Left ventricular diastolic Doppler parameters are consistent with pseudonormalization pattern of LV diastolic filling.  4. Global right ventricle has normal systolic function.The right ventricular size is not well visualized. No increase in right ventricular wall thickness.  5. Left atrial size was mildly dilated.  6. Right atrial size was not well visualized.  7. The pericardium was not well visualized.  8. The mitral valve is grossly normal. Trace mitral valve regurgitation.  9. The tricuspid valve is not well visualized. Tricuspid valve regurgitation is trivial. 10. The aortic  valve was not well visualized Aortic valve regurgitation was not visualized by color flow Doppler. There is no aortic valve stenosis. 11. The pulmonic valve was not well visualized. Pulmonic valve regurgitation is not visualized by color flow Doppler. 12. TR signal is inadequate for assessing pulmonary artery systolic pressure. 13. The inferior vena cava is normal in size with greater than 50% respiratory variability, suggesting right atrial pressure of 3 mmHg. 14. The interatrial septum was not well visualized.  Lexiscan myocardial perfusion imaging test 09/10/2019 Pharmacological myocardial perfusion imaging study with no significant  Ischemia Small region fixed apical defect, likely secondary to attenuation artifact though unable to exclude small region of old infarct GI uptake artifact noted Mild global hypokinesis, EF estimated at 35% (depressed EF possibly exaggerated by GI uptake artifact) No EKG changes concerning for ischemia at peak stress or in recovery. Resting EKG with nonspecific ST abnormality CT attenuation corrected images with significant three-vessel coronary calcification, mild aortic atherosclerosis Low risk scan.    EKG:  EKG is   ordered today.  The ekg ordered today demonstrates normal sinus rhythm, normal ECG.  Recent Labs: 06/15/2019: ALT 11; B Natriuretic Peptide 173.0 06/25/2019: BUN 20; Creatinine, Ser 4.73; Hemoglobin 8.4; Magnesium 1.9; Platelets 143; Potassium 3.8; Sodium 138  Recent Lipid Panel    Component Value Date/Time   CHOL 116 04/21/2019 1334   TRIG 81 04/21/2019 1334   HDL 37 (L) 04/21/2019 1334   CHOLHDL 3.7 10/08/2018 0955   LDLCALC 63 04/21/2019 1334    Physical Exam:    VS:  BP 120/90 (BP Location: Left Arm, Patient Position: Sitting, Cuff Size: Large)   Pulse 90   Temp (!) 97.3 F (36.3 C)   Ht 5\' 3"  (1.6 m)   Wt 267 lb 12 oz (121.5 kg)   SpO2 98% Comment: on 3L O2  BMI 47.43 kg/m     Wt Readings from Last 3 Encounters:  10/06/19 267 lb 12 oz (121.5 kg)  09/08/19 267 lb (121.1 kg)  09/01/19 265 lb 4 oz (120.3 kg)     GEN:  Well nourished, well developed in no acute distress, nasal canula HEENT: Normal NECK: No JVD; No carotid bruits LYMPHATICS: No lymphadenopathy CARDIAC: RRR, no murmurs, rubs, gallops RESPIRATORY:  Clear to auscultation without rales, wheezing or rhonchi, decreased breath sounds at bases. ABDOMEN: Soft, non-tender, non-distended MUSCULOSKELETAL:  No edema; No deformity  SKIN: Warm and dry NEUROLOGIC:  Alert and oriented x 3 PSYCHIATRIC:  Normal affect   ASSESSMENT:    His echocardiogram showed mildly reduced ejection fraction, his Lexiscan myocardial perfusion imaging stress test did not show any evidence of ischemia.  He is on good medications for his CAD and mildly reduced ejection fraction.  Patient is medically optimized from a cardiac perspective to get procedure.  1. Coronary artery disease involving native coronary artery of native heart without angina pectoris   2. Systolic congestive heart failure, unspecified HF chronicity ([place])   3. Essential hypertension   4. Preprocedural cardiovascular examination    PLAN:     1. Continue aspirin  Lipitor as currently prescribed 2. Continue heart failure medical regimen as currently prescribed. 3. Blood pressures well controlled. 4. Patient is medically optimized from a cardiac perspective for procedure.  He has no ischemia on Lexiscan.  He can thus proceed with procedure.  Follow-up in 6 months.   Medication Adjustments/Labs and Tests Ordered: Current medicines are reviewed at length with the patient today.  Concerns regarding medicines are outlined above.  Orders Placed This Encounter  Procedures  . EKG 12-Lead   No orders of the defined types were placed in this encounter.   Patient Instructions  Medication Instructions:  Your physician recommends that you continue on your current medications as directed. Please refer to the Current Medication list given to you today.  *If you need a refill on your cardiac medications before your next appointment, please call your pharmacy*  Lab Work: none If you have labs (blood work) drawn today and your tests are completely normal, you will receive your results only by: [name] Kitchen MyChart Message (if you have MyChart) OR . A paper copy in the mail If you have any lab test that is abnormal or we need to change your treatment, we will call you to review the results.  Testing/Procedures: none  Follow-Up: At [facility], you and your health needs are our priority.  As part of our continuing mission to provide you with exceptional heart care, we have created designated Provider Care Teams.  These Care Teams include your primary Cardiologist (physician) and Advanced Practice Providers (APPs -  Physician Assistants and Nurse Practitioners) who all work together to provide you with the care you need, when you need it.  Your next appointment:   6 months  The format for your next appointment:   In Person  Provider:   Please call us to schedule your appointment in February 2021 for April.  You may see Kate Sable, MD or one of the  following Advanced Practice Providers on your designated Care Team:    Murray Hodgkins, NP  Christell Faith, PA-C  Marrianne Mood, PA-C      Signed, Kate Sable, MD  10/06/2019 12:00 PM    [place]

## 2019-10-06 NOTE — Patient Instructions (Signed)
Medication Instructions:  Your physician recommends that you continue on your current medications as directed. Please refer to the Current Medication list given to you today.  *If you need a refill on your cardiac medications before your next appointment, please call your pharmacy*  Lab Work: none If you have labs (blood work) drawn today and your tests are completely normal, you will receive your results only by: [name] Kitchen MyChart Message (if you have MyChart) OR . A paper copy in the mail If you have any lab test that is abnormal or we need to change your treatment, we will call you to review the results.  Testing/Procedures: none  Follow-Up: At [facility], you and your health needs are our priority.  As part of our continuing mission to provide you with exceptional heart care, we have created designated Provider Care Teams.  These Care Teams include your primary Cardiologist (physician) and Advanced Practice Providers (APPs -  Physician Assistants and Nurse Practitioners) who all work together to provide you with the care you need, when you need it.  Your next appointment:   6 months  The format for your next appointment:   In Person  Provider:   Please call us to schedule your appointment in February 2021 for April.  You may see Kate Sable, MD or one of the following Advanced Practice Providers on your designated Care Team:    Murray Hodgkins, NP  Christell Faith, PA-C  Marrianne Mood, PA-C

## 2019-10-07 DIAGNOSIS — D509 Iron deficiency anemia, unspecified: Secondary | ICD-10-CM | POA: Diagnosis not present

## 2019-10-07 DIAGNOSIS — N2581 Secondary hyperparathyroidism of renal origin: Secondary | ICD-10-CM | POA: Diagnosis not present

## 2019-10-07 DIAGNOSIS — D631 Anemia in chronic kidney disease: Secondary | ICD-10-CM | POA: Diagnosis not present

## 2019-10-07 DIAGNOSIS — Z992 Dependence on renal dialysis: Secondary | ICD-10-CM | POA: Diagnosis not present

## 2019-10-07 DIAGNOSIS — N186 End stage renal disease: Secondary | ICD-10-CM | POA: Diagnosis not present

## 2019-10-09 DIAGNOSIS — N2581 Secondary hyperparathyroidism of renal origin: Secondary | ICD-10-CM | POA: Diagnosis not present

## 2019-10-09 DIAGNOSIS — D631 Anemia in chronic kidney disease: Secondary | ICD-10-CM | POA: Diagnosis not present

## 2019-10-09 DIAGNOSIS — N186 End stage renal disease: Secondary | ICD-10-CM | POA: Diagnosis not present

## 2019-10-09 DIAGNOSIS — D509 Iron deficiency anemia, unspecified: Secondary | ICD-10-CM | POA: Diagnosis not present

## 2019-10-09 DIAGNOSIS — Z992 Dependence on renal dialysis: Secondary | ICD-10-CM | POA: Diagnosis not present

## 2019-10-12 DIAGNOSIS — N186 End stage renal disease: Secondary | ICD-10-CM | POA: Diagnosis not present

## 2019-10-12 DIAGNOSIS — D631 Anemia in chronic kidney disease: Secondary | ICD-10-CM | POA: Diagnosis not present

## 2019-10-12 DIAGNOSIS — N2581 Secondary hyperparathyroidism of renal origin: Secondary | ICD-10-CM | POA: Diagnosis not present

## 2019-10-12 DIAGNOSIS — D509 Iron deficiency anemia, unspecified: Secondary | ICD-10-CM | POA: Diagnosis not present

## 2019-10-12 DIAGNOSIS — Z992 Dependence on renal dialysis: Secondary | ICD-10-CM | POA: Diagnosis not present

## 2019-10-13 ENCOUNTER — Ambulatory Visit: Payer: Medicare Other | Admitting: Cardiology

## 2019-10-13 ENCOUNTER — Other Ambulatory Visit: Payer: Self-pay

## 2019-10-13 MED ORDER — UMECLIDINIUM-VILANTEROL 62.5-25 MCG/INH IN AEPB: 1.0000 | INHALATION_SPRAY | Freq: Every day | RESPIRATORY_TRACT | 2 refills | Status: DC

## 2019-10-14 DIAGNOSIS — N2581 Secondary hyperparathyroidism of renal origin: Secondary | ICD-10-CM | POA: Diagnosis not present

## 2019-10-14 DIAGNOSIS — D509 Iron deficiency anemia, unspecified: Secondary | ICD-10-CM | POA: Diagnosis not present

## 2019-10-14 DIAGNOSIS — D631 Anemia in chronic kidney disease: Secondary | ICD-10-CM | POA: Diagnosis not present

## 2019-10-14 DIAGNOSIS — Z992 Dependence on renal dialysis: Secondary | ICD-10-CM | POA: Diagnosis not present

## 2019-10-14 DIAGNOSIS — N186 End stage renal disease: Secondary | ICD-10-CM | POA: Diagnosis not present

## 2019-10-15 DIAGNOSIS — H401131 Primary open-angle glaucoma, bilateral, mild stage: Secondary | ICD-10-CM | POA: Diagnosis not present

## 2019-10-16 DIAGNOSIS — N2581 Secondary hyperparathyroidism of renal origin: Secondary | ICD-10-CM | POA: Diagnosis not present

## 2019-10-16 DIAGNOSIS — N186 End stage renal disease: Secondary | ICD-10-CM | POA: Diagnosis not present

## 2019-10-16 DIAGNOSIS — D509 Iron deficiency anemia, unspecified: Secondary | ICD-10-CM | POA: Diagnosis not present

## 2019-10-16 DIAGNOSIS — Z992 Dependence on renal dialysis: Secondary | ICD-10-CM | POA: Diagnosis not present

## 2019-10-16 DIAGNOSIS — D631 Anemia in chronic kidney disease: Secondary | ICD-10-CM | POA: Diagnosis not present

## 2019-10-17 DIAGNOSIS — I2510 Atherosclerotic heart disease of native coronary artery without angina pectoris: Secondary | ICD-10-CM | POA: Diagnosis not present

## 2019-10-17 DIAGNOSIS — E785 Hyperlipidemia, unspecified: Secondary | ICD-10-CM | POA: Diagnosis not present

## 2019-10-17 DIAGNOSIS — G4733 Obstructive sleep apnea (adult) (pediatric): Secondary | ICD-10-CM | POA: Diagnosis not present

## 2019-10-17 DIAGNOSIS — Z992 Dependence on renal dialysis: Secondary | ICD-10-CM | POA: Diagnosis not present

## 2019-10-17 DIAGNOSIS — M1030 Gout due to renal impairment, unspecified site: Secondary | ICD-10-CM | POA: Diagnosis not present

## 2019-10-17 DIAGNOSIS — Z794 Long term (current) use of insulin: Secondary | ICD-10-CM | POA: Diagnosis not present

## 2019-10-17 DIAGNOSIS — I509 Heart failure, unspecified: Secondary | ICD-10-CM | POA: Diagnosis not present

## 2019-10-17 DIAGNOSIS — Z9981 Dependence on supplemental oxygen: Secondary | ICD-10-CM | POA: Diagnosis not present

## 2019-10-17 DIAGNOSIS — I132 Hypertensive heart and chronic kidney disease with heart failure and with stage 5 chronic kidney disease, or end stage renal disease: Secondary | ICD-10-CM | POA: Diagnosis not present

## 2019-10-17 DIAGNOSIS — J9612 Chronic respiratory failure with hypercapnia: Secondary | ICD-10-CM | POA: Diagnosis not present

## 2019-10-17 DIAGNOSIS — E1122 Type 2 diabetes mellitus with diabetic chronic kidney disease: Secondary | ICD-10-CM | POA: Diagnosis not present

## 2019-10-17 DIAGNOSIS — E1142 Type 2 diabetes mellitus with diabetic polyneuropathy: Secondary | ICD-10-CM | POA: Diagnosis not present

## 2019-10-17 DIAGNOSIS — J9611 Chronic respiratory failure with hypoxia: Secondary | ICD-10-CM | POA: Diagnosis not present

## 2019-10-17 DIAGNOSIS — G510 Bell's palsy: Secondary | ICD-10-CM | POA: Diagnosis not present

## 2019-10-17 DIAGNOSIS — J449 Chronic obstructive pulmonary disease, unspecified: Secondary | ICD-10-CM | POA: Diagnosis not present

## 2019-10-17 DIAGNOSIS — N186 End stage renal disease: Secondary | ICD-10-CM | POA: Diagnosis not present

## 2019-10-19 DIAGNOSIS — Z992 Dependence on renal dialysis: Secondary | ICD-10-CM | POA: Diagnosis not present

## 2019-10-19 DIAGNOSIS — N186 End stage renal disease: Secondary | ICD-10-CM | POA: Diagnosis not present

## 2019-10-19 DIAGNOSIS — D509 Iron deficiency anemia, unspecified: Secondary | ICD-10-CM | POA: Diagnosis not present

## 2019-10-19 DIAGNOSIS — N2581 Secondary hyperparathyroidism of renal origin: Secondary | ICD-10-CM | POA: Diagnosis not present

## 2019-10-20 DIAGNOSIS — E1169 Type 2 diabetes mellitus with other specified complication: Secondary | ICD-10-CM | POA: Diagnosis not present

## 2019-10-20 DIAGNOSIS — E785 Hyperlipidemia, unspecified: Secondary | ICD-10-CM | POA: Diagnosis not present

## 2019-10-20 DIAGNOSIS — Z992 Dependence on renal dialysis: Secondary | ICD-10-CM | POA: Diagnosis not present

## 2019-10-20 DIAGNOSIS — E1159 Type 2 diabetes mellitus with other circulatory complications: Secondary | ICD-10-CM | POA: Diagnosis not present

## 2019-10-20 DIAGNOSIS — Z794 Long term (current) use of insulin: Secondary | ICD-10-CM | POA: Diagnosis not present

## 2019-10-20 DIAGNOSIS — I10 Essential (primary) hypertension: Secondary | ICD-10-CM | POA: Diagnosis not present

## 2019-10-20 DIAGNOSIS — E1122 Type 2 diabetes mellitus with diabetic chronic kidney disease: Secondary | ICD-10-CM | POA: Diagnosis not present

## 2019-10-20 DIAGNOSIS — N186 End stage renal disease: Secondary | ICD-10-CM | POA: Diagnosis not present

## 2019-10-21 DIAGNOSIS — Z992 Dependence on renal dialysis: Secondary | ICD-10-CM | POA: Diagnosis not present

## 2019-10-21 DIAGNOSIS — N2581 Secondary hyperparathyroidism of renal origin: Secondary | ICD-10-CM | POA: Diagnosis not present

## 2019-10-21 DIAGNOSIS — D509 Iron deficiency anemia, unspecified: Secondary | ICD-10-CM | POA: Diagnosis not present

## 2019-10-21 DIAGNOSIS — N186 End stage renal disease: Secondary | ICD-10-CM | POA: Diagnosis not present

## 2019-10-26 DIAGNOSIS — Z992 Dependence on renal dialysis: Secondary | ICD-10-CM | POA: Diagnosis not present

## 2019-10-26 DIAGNOSIS — N186 End stage renal disease: Secondary | ICD-10-CM | POA: Diagnosis not present

## 2019-10-26 DIAGNOSIS — N2581 Secondary hyperparathyroidism of renal origin: Secondary | ICD-10-CM | POA: Diagnosis not present

## 2019-10-26 DIAGNOSIS — D509 Iron deficiency anemia, unspecified: Secondary | ICD-10-CM | POA: Diagnosis not present

## 2019-10-27 DIAGNOSIS — E113299 Type 2 diabetes mellitus with mild nonproliferative diabetic retinopathy without macular edema, unspecified eye: Secondary | ICD-10-CM | POA: Insufficient documentation

## 2019-10-28 ENCOUNTER — Telehealth (INDEPENDENT_AMBULATORY_CARE_PROVIDER_SITE_OTHER): Payer: Self-pay

## 2019-10-28 DIAGNOSIS — D509 Iron deficiency anemia, unspecified: Secondary | ICD-10-CM | POA: Diagnosis not present

## 2019-10-28 DIAGNOSIS — N186 End stage renal disease: Secondary | ICD-10-CM | POA: Diagnosis not present

## 2019-10-28 DIAGNOSIS — Z992 Dependence on renal dialysis: Secondary | ICD-10-CM | POA: Diagnosis not present

## 2019-10-28 DIAGNOSIS — N2581 Secondary hyperparathyroidism of renal origin: Secondary | ICD-10-CM | POA: Diagnosis not present

## 2019-10-28 NOTE — Telephone Encounter (Signed)
Spoke with Elta Guadeloupe at [facility] regarding getting the patient scheduled for surgery. Per Elta Guadeloupe we have scheduled the patient for surgery with Dr. Delana Meyer on 11/20/2019. Patient will do his Covid testing  on

## 2019-10-29 ENCOUNTER — Encounter (INDEPENDENT_AMBULATORY_CARE_PROVIDER_SITE_OTHER): Payer: Self-pay

## 2019-10-30 DIAGNOSIS — Z992 Dependence on renal dialysis: Secondary | ICD-10-CM | POA: Diagnosis not present

## 2019-10-30 DIAGNOSIS — D509 Iron deficiency anemia, unspecified: Secondary | ICD-10-CM | POA: Diagnosis not present

## 2019-10-30 DIAGNOSIS — N2581 Secondary hyperparathyroidism of renal origin: Secondary | ICD-10-CM | POA: Diagnosis not present

## 2019-10-30 DIAGNOSIS — N186 End stage renal disease: Secondary | ICD-10-CM | POA: Diagnosis not present

## 2019-11-02 DIAGNOSIS — D509 Iron deficiency anemia, unspecified: Secondary | ICD-10-CM | POA: Diagnosis not present

## 2019-11-02 DIAGNOSIS — Z992 Dependence on renal dialysis: Secondary | ICD-10-CM | POA: Diagnosis not present

## 2019-11-02 DIAGNOSIS — N186 End stage renal disease: Secondary | ICD-10-CM | POA: Diagnosis not present

## 2019-11-02 DIAGNOSIS — N2581 Secondary hyperparathyroidism of renal origin: Secondary | ICD-10-CM | POA: Diagnosis not present

## 2019-11-02 NOTE — Telephone Encounter (Signed)
Spoke with the patient and went over his appt concerning his surgery on 11/20/2019. Patient will do his Covid and pre-op on 11/17/2019 at 1:00 pm at the [facility]. Patient's surgery is scheduled for 11/20/2019 at the [facility]

## 2019-11-04 DIAGNOSIS — N2581 Secondary hyperparathyroidism of renal origin: Secondary | ICD-10-CM | POA: Diagnosis not present

## 2019-11-04 DIAGNOSIS — Z992 Dependence on renal dialysis: Secondary | ICD-10-CM | POA: Diagnosis not present

## 2019-11-04 DIAGNOSIS — N186 End stage renal disease: Secondary | ICD-10-CM | POA: Diagnosis not present

## 2019-11-04 DIAGNOSIS — D509 Iron deficiency anemia, unspecified: Secondary | ICD-10-CM | POA: Diagnosis not present

## 2019-11-06 DIAGNOSIS — J9612 Chronic respiratory failure with hypercapnia: Secondary | ICD-10-CM | POA: Diagnosis not present

## 2019-11-06 DIAGNOSIS — N186 End stage renal disease: Secondary | ICD-10-CM | POA: Diagnosis not present

## 2019-11-06 DIAGNOSIS — D509 Iron deficiency anemia, unspecified: Secondary | ICD-10-CM | POA: Diagnosis not present

## 2019-11-06 DIAGNOSIS — I2510 Atherosclerotic heart disease of native coronary artery without angina pectoris: Secondary | ICD-10-CM | POA: Diagnosis not present

## 2019-11-06 DIAGNOSIS — I132 Hypertensive heart and chronic kidney disease with heart failure and with stage 5 chronic kidney disease, or end stage renal disease: Secondary | ICD-10-CM | POA: Diagnosis not present

## 2019-11-06 DIAGNOSIS — N2581 Secondary hyperparathyroidism of renal origin: Secondary | ICD-10-CM | POA: Diagnosis not present

## 2019-11-06 DIAGNOSIS — Z992 Dependence on renal dialysis: Secondary | ICD-10-CM | POA: Diagnosis not present

## 2019-11-06 DIAGNOSIS — I509 Heart failure, unspecified: Secondary | ICD-10-CM | POA: Diagnosis not present

## 2019-11-06 DIAGNOSIS — J449 Chronic obstructive pulmonary disease, unspecified: Secondary | ICD-10-CM | POA: Diagnosis not present

## 2019-11-06 DIAGNOSIS — J9611 Chronic respiratory failure with hypoxia: Secondary | ICD-10-CM | POA: Diagnosis not present

## 2019-11-09 DIAGNOSIS — D509 Iron deficiency anemia, unspecified: Secondary | ICD-10-CM | POA: Diagnosis not present

## 2019-11-09 DIAGNOSIS — N186 End stage renal disease: Secondary | ICD-10-CM | POA: Diagnosis not present

## 2019-11-09 DIAGNOSIS — N2581 Secondary hyperparathyroidism of renal origin: Secondary | ICD-10-CM | POA: Diagnosis not present

## 2019-11-09 DIAGNOSIS — Z992 Dependence on renal dialysis: Secondary | ICD-10-CM | POA: Diagnosis not present

## 2019-11-11 DIAGNOSIS — D509 Iron deficiency anemia, unspecified: Secondary | ICD-10-CM | POA: Diagnosis not present

## 2019-11-11 DIAGNOSIS — Z992 Dependence on renal dialysis: Secondary | ICD-10-CM | POA: Diagnosis not present

## 2019-11-11 DIAGNOSIS — N186 End stage renal disease: Secondary | ICD-10-CM | POA: Diagnosis not present

## 2019-11-11 DIAGNOSIS — N2581 Secondary hyperparathyroidism of renal origin: Secondary | ICD-10-CM | POA: Diagnosis not present

## 2019-11-16 ENCOUNTER — Other Ambulatory Visit: Payer: Medicare Other

## 2019-11-16 DIAGNOSIS — D509 Iron deficiency anemia, unspecified: Secondary | ICD-10-CM | POA: Diagnosis not present

## 2019-11-16 DIAGNOSIS — Z992 Dependence on renal dialysis: Secondary | ICD-10-CM | POA: Diagnosis not present

## 2019-11-16 DIAGNOSIS — N2581 Secondary hyperparathyroidism of renal origin: Secondary | ICD-10-CM | POA: Diagnosis not present

## 2019-11-16 DIAGNOSIS — N186 End stage renal disease: Secondary | ICD-10-CM | POA: Diagnosis not present

## 2019-11-17 ENCOUNTER — Encounter
Admission: RE | Admit: 2019-11-17 | Discharge: 2019-11-17 | Disposition: A | Discharge status: Home / Self Care | Payer: Medicare Other | Source: Ambulatory Visit | Attending: Vascular Surgery | Admitting: Vascular Surgery

## 2019-11-17 ENCOUNTER — Other Ambulatory Visit: Admission: RE | Admit: 2019-11-17 | Payer: Medicare Other | Source: Ambulatory Visit

## 2019-11-17 ENCOUNTER — Other Ambulatory Visit: Payer: Medicare Other

## 2019-11-17 ENCOUNTER — Other Ambulatory Visit (INDEPENDENT_AMBULATORY_CARE_PROVIDER_SITE_OTHER): Payer: Self-pay | Admitting: Nurse Practitioner

## 2019-11-17 ENCOUNTER — Other Ambulatory Visit: Payer: Self-pay

## 2019-11-17 DIAGNOSIS — I509 Heart failure, unspecified: Secondary | ICD-10-CM | POA: Diagnosis not present

## 2019-11-17 DIAGNOSIS — H2512 Age-related nuclear cataract, left eye: Secondary | ICD-10-CM | POA: Diagnosis not present

## 2019-11-17 DIAGNOSIS — Z01812 Encounter for preprocedural laboratory examination: Secondary | ICD-10-CM | POA: Diagnosis not present

## 2019-11-17 DIAGNOSIS — Z20828 Contact with and (suspected) exposure to other viral communicable diseases: Secondary | ICD-10-CM | POA: Diagnosis not present

## 2019-11-17 HISTORY — DX: Gastro-esophageal reflux disease without esophagitis: K21.9

## 2019-11-17 LAB — CBC WITH DIFFERENTIAL/PLATELET
Abs Immature Granulocytes: 0.04 10*3/uL (ref 0.00–0.07)
Basophils Absolute: 0 10*3/uL (ref 0.0–0.1)
Basophils Relative: 0 %
Eosinophils Absolute: 0.5 10*3/uL (ref 0.0–0.5)
Eosinophils Relative: 5 %
HCT: 41 % (ref 39.0–52.0)
Hemoglobin: 12.8 g/dL — ABNORMAL LOW (ref 13.0–17.0)
Immature Granulocytes: 0 %
Lymphocytes Relative: 20 %
Lymphs Abs: 2.2 10*3/uL (ref 0.7–4.0)
MCH: 26.2 pg (ref 26.0–34.0)
MCHC: 31.2 g/dL (ref 30.0–36.0)
MCV: 83.8 fL (ref 80.0–100.0)
Monocytes Absolute: 0.7 10*3/uL (ref 0.1–1.0)
Monocytes Relative: 6 %
Neutro Abs: 7.7 10*3/uL (ref 1.7–7.7)
Neutrophils Relative %: 69 %
Platelets: 227 10*3/uL (ref 150–400)
RBC: 4.89 MIL/uL (ref 4.22–5.81)
RDW: 14.2 % (ref 11.5–15.5)
WBC: 11.1 10*3/uL — ABNORMAL HIGH (ref 4.0–10.5)
nRBC: 0 % (ref 0.0–0.2)

## 2019-11-17 LAB — TYPE AND SCREEN
ABO/RH(D): O POS
Antibody Screen: NEGATIVE

## 2019-11-17 LAB — BASIC METABOLIC PANEL
Anion gap: 10 (ref 5–15)
BUN: 39 mg/dL — ABNORMAL HIGH (ref 6–20)
CO2: 27 mmol/L (ref 22–32)
Calcium: 9.2 mg/dL (ref 8.9–10.3)
Chloride: 105 mmol/L (ref 98–111)
Creatinine, Ser: 3.68 mg/dL — ABNORMAL HIGH (ref 0.61–1.24)
GFR calc Af Amer: 21 mL/min — ABNORMAL LOW (ref >60)
GFR calc non Af Amer: 18 mL/min — ABNORMAL LOW (ref >60)
Glucose, Bld: 176 mg/dL — ABNORMAL HIGH (ref 70–99)
Potassium: 4.2 mmol/L (ref 3.5–5.1)
Sodium: 142 mmol/L (ref 135–145)

## 2019-11-17 LAB — PROTIME-INR
INR: 1.1 (ref 0.8–1.2)
Prothrombin Time: 13.8 seconds (ref 11.4–15.2)

## 2019-11-17 LAB — APTT: aPTT: 36 seconds (ref 24–36)

## 2019-11-17 NOTE — Patient Instructions (Signed)
Your procedure is scheduled on: Friday November 20, 2019 Report to Day Surgery on the 2nd floor of the [facility]. To find out your arrival time, please call [phone] between 1PM - 3PM on: Thursday November 19, 2019  REMEMBER: Instructions that are not followed completely may result in serious medical risk, up to and including death; or upon the discretion of your surgeon and anesthesiologist your surgery may need to be rescheduled.  Do not eat food after midnight the night before surgery.  No gum chewing, lozengers or hard candies.  You may however, drink CLEAR liquids up to 2 hours before you are scheduled to arrive for your surgery. Do not drink anything within 2 hours of the start of your surgery.  Clear liquids include: - water   Do NOT drink anything that is not on this list.  Type 1 and Type 2 diabetics should only drink water.   No Alcohol for 24 hours before or after surgery.  No Smoking including e-cigarettes for 24 hours prior to surgery.  No chewable tobacco products for at least 6 hours prior to surgery.  No nicotine patches on the day of surgery.  On the morning of surgery brush your teeth with toothpaste and water, you may rinse your mouth with mouthwash if you wish. Do not swallow any toothpaste or mouthwash.  Notify your doctor if there is any change in your medical condition (cold, fever, infection).  Do not wear jewelry, make-up, hairpins, clips or nail polish.  Do not wear lotions, powders, or perfumes.   Do not shave 48 hours prior to surgery.   Contacts and dentures may not be worn into surgery.  Do not bring valuables to the hospital, including drivers license, insurance or credit cards.  [facility] is not responsible for any belongings or valuables.   TAKE THESE MEDICATIONS THE MORNING OF SURGERY: Carvedilol omeprazole (take one the night before and one on the morning of surgery - helps to prevent nausea after  surgery.) Gabapentin Allopurinol  Hydralazine Isosorbide  Use CHG wipes as directed on instruction sheet. Use inhalers morning of surgery.  Bring your C-PAP to the hospital with you in case you may have to spend the night.   Take 1/2 of usual insulin dose the night before surgery and none on the morning of surgery.  Follow recommendations from Cardiologist, Pulmonologist or PCP regarding stopping Aspirin  Stop Anti-inflammatories (NSAIDS) such as Advil, Aleve, Ibuprofen, Motrin, Naproxen, Naprosyn and Aspirin based products such as Excedrin, Goodys Powder, BC Powder. (May take Tylenol or Acetaminophen if needed.)  Stop ANY OVER THE COUNTER supplements until after surgery. (May continue Vitamin D, Vitamin B, and multivitamin.)  Wear comfortable clothing (specific to your surgery type) to the hospital.  Plan for stool softeners for home use.  If you are being admitted to the hospital overnight, leave your suitcase in the car. After surgery it may be brought to your room.  If you are being discharged the day of surgery, you will not be allowed to drive home. You will need a responsible adult to drive you home and stay with you that night.   If you are taking public transportation, you will need to have a responsible adult with you. Please confirm with your physician that it is acceptable to use public transportation.   Please call [phone] if you have any questions about these instructions.

## 2019-11-18 LAB — SARS CORONAVIRUS 2 (TAT 6-24 HRS): SARS Coronavirus 2: NEGATIVE

## 2019-11-20 ENCOUNTER — Ambulatory Visit: Payer: Medicare Other | Admitting: Anesthesiology

## 2019-11-20 ENCOUNTER — Ambulatory Visit
Admission: RE | Admit: 2019-11-20 | Discharge: 2019-11-20 | Disposition: A | Discharge status: Home / Self Care | Payer: Medicare Other | Attending: Vascular Surgery | Admitting: Vascular Surgery

## 2019-11-20 ENCOUNTER — Encounter
Admission: RE | Disposition: A | Discharge status: Home / Self Care | Payer: Self-pay | Source: Home / Self Care | Attending: Vascular Surgery

## 2019-11-20 ENCOUNTER — Encounter: Payer: Self-pay | Admitting: *Deleted

## 2019-11-20 DIAGNOSIS — E1122 Type 2 diabetes mellitus with diabetic chronic kidney disease: Secondary | ICD-10-CM | POA: Insufficient documentation

## 2019-11-20 DIAGNOSIS — J449 Chronic obstructive pulmonary disease, unspecified: Secondary | ICD-10-CM | POA: Insufficient documentation

## 2019-11-20 DIAGNOSIS — I509 Heart failure, unspecified: Secondary | ICD-10-CM | POA: Diagnosis not present

## 2019-11-20 DIAGNOSIS — Z9981 Dependence on supplemental oxygen: Secondary | ICD-10-CM | POA: Insufficient documentation

## 2019-11-20 DIAGNOSIS — I132 Hypertensive heart and chronic kidney disease with heart failure and with stage 5 chronic kidney disease, or end stage renal disease: Secondary | ICD-10-CM | POA: Insufficient documentation

## 2019-11-20 DIAGNOSIS — Z7984 Long term (current) use of oral hypoglycemic drugs: Secondary | ICD-10-CM | POA: Diagnosis not present

## 2019-11-20 DIAGNOSIS — N186 End stage renal disease: Secondary | ICD-10-CM | POA: Insufficient documentation

## 2019-11-20 DIAGNOSIS — G4730 Sleep apnea, unspecified: Secondary | ICD-10-CM | POA: Insufficient documentation

## 2019-11-20 DIAGNOSIS — Z6841 Body Mass Index (BMI) 40.0 and over, adult: Secondary | ICD-10-CM | POA: Diagnosis not present

## 2019-11-20 DIAGNOSIS — N185 Chronic kidney disease, stage 5: Secondary | ICD-10-CM

## 2019-11-20 DIAGNOSIS — K219 Gastro-esophageal reflux disease without esophagitis: Secondary | ICD-10-CM | POA: Insufficient documentation

## 2019-11-20 HISTORY — PX: AV FISTULA PLACEMENT: SHX1204

## 2019-11-20 LAB — POCT I-STAT, CHEM 8
BUN: 25 mg/dL — ABNORMAL HIGH (ref 6–20)
Calcium, Ion: 1.19 mmol/L (ref 1.15–1.40)
Chloride: 101 mmol/L (ref 98–111)
Creatinine, Ser: 3.9 mg/dL — ABNORMAL HIGH (ref 0.61–1.24)
Glucose, Bld: 178 mg/dL — ABNORMAL HIGH (ref 70–99)
HCT: 45 % (ref 39.0–52.0)
Hemoglobin: 15.3 g/dL (ref 13.0–17.0)
Potassium: 4 mmol/L (ref 3.5–5.1)
Sodium: 140 mmol/L (ref 135–145)
TCO2: 27 mmol/L (ref 22–32)

## 2019-11-20 LAB — ABO/RH: ABO/RH(D): O POS

## 2019-11-20 LAB — GLUCOSE, CAPILLARY
Glucose-Capillary: 187 mg/dL — ABNORMAL HIGH (ref 70–99)
Glucose-Capillary: 245 mg/dL — ABNORMAL HIGH (ref 70–99)

## 2019-11-20 SURGERY — ARTERIOVENOUS (AV) FISTULA CREATION
Anesthesia: General | Laterality: Right

## 2019-11-20 MED ORDER — ROCURONIUM BROMIDE 50 MG/5ML IV SOLN
INTRAVENOUS | Status: AC
  Filled 2019-11-20: qty 1

## 2019-11-20 MED ORDER — IPRATROPIUM-ALBUTEROL 0.5-2.5 (3) MG/3ML IN SOLN
3.0000 mL | Freq: Once | RESPIRATORY_TRACT | Status: AC
  Administered 2019-11-20: 07:00:00 3 mL via RESPIRATORY_TRACT

## 2019-11-20 MED ORDER — SODIUM CHLORIDE 0.9 % IV SOLN
INTRAVENOUS | Status: DC
  Administered 2019-11-20: 07:00:00 via INTRAVENOUS

## 2019-11-20 MED ORDER — OXYCODONE-ACETAMINOPHEN 5-325 MG PO TABS: 1.0000 | ORAL_TABLET | Freq: Four times a day (QID) | ORAL | 0 refills | Status: DC | PRN

## 2019-11-20 MED ORDER — ONDANSETRON HCL 4 MG/2ML IJ SOLN: 4.0000 mg | Freq: Once | INTRAMUSCULAR | Status: DC | PRN

## 2019-11-20 MED ORDER — ETOMIDATE 2 MG/ML IV SOLN
INTRAVENOUS | Status: AC
  Filled 2019-11-20: qty 10

## 2019-11-20 MED ORDER — OXYCODONE-ACETAMINOPHEN 5-325 MG PO TABS
1.0000 | ORAL_TABLET | Freq: Once | ORAL | Status: AC
  Administered 2019-11-20: 2 via ORAL

## 2019-11-20 MED ORDER — BUPIVACAINE LIPOSOME 1.3 % IJ SUSP
INTRAMUSCULAR | Status: AC
  Filled 2019-11-20: qty 20

## 2019-11-20 MED ORDER — GLYCOPYRROLATE 0.2 MG/ML IJ SOLN
INTRAMUSCULAR | Status: DC | PRN
  Administered 2019-11-20: .8 mg via INTRAVENOUS

## 2019-11-20 MED ORDER — CEFAZOLIN SODIUM-DEXTROSE 1-4 GM/50ML-% IV SOLN
INTRAVENOUS | Status: AC
  Filled 2019-11-20: qty 50

## 2019-11-20 MED ORDER — ETOMIDATE 2 MG/ML IV SOLN
INTRAVENOUS | Status: DC | PRN
  Administered 2019-11-20: 16 mg via INTRAVENOUS

## 2019-11-20 MED ORDER — HEPARIN SODIUM (PORCINE) 5000 UNIT/ML IJ SOLN
INTRAMUSCULAR | Status: AC
  Filled 2019-11-20: qty 1

## 2019-11-20 MED ORDER — CHLORHEXIDINE GLUCONATE CLOTH 2 % EX PADS: 6.0000 | MEDICATED_PAD | Freq: Once | CUTANEOUS | Status: DC

## 2019-11-20 MED ORDER — OXYCODONE-ACETAMINOPHEN 5-325 MG PO TABS
ORAL_TABLET | ORAL | Status: AC
  Filled 2019-11-20: qty 2

## 2019-11-20 MED ORDER — GLYCOPYRROLATE 0.2 MG/ML IJ SOLN
INTRAMUSCULAR | Status: AC
  Filled 2019-11-20: qty 4

## 2019-11-20 MED ORDER — LIDOCAINE HCL (PF) 2 % IJ SOLN
INTRAMUSCULAR | Status: AC
  Filled 2019-11-20: qty 10

## 2019-11-20 MED ORDER — FENTANYL CITRATE (PF) 100 MCG/2ML IJ SOLN
INTRAMUSCULAR | Status: AC
  Filled 2019-11-20: qty 2

## 2019-11-20 MED ORDER — NEOSTIGMINE METHYLSULFATE 10 MG/10ML IV SOLN
INTRAVENOUS | Status: AC
  Filled 2019-11-20: qty 1

## 2019-11-20 MED ORDER — NEOSTIGMINE METHYLSULFATE 10 MG/10ML IV SOLN
INTRAVENOUS | Status: DC | PRN
  Administered 2019-11-20: 5 mg via INTRAVENOUS

## 2019-11-20 MED ORDER — ACETAMINOPHEN 10 MG/ML IV SOLN
INTRAVENOUS | Status: DC | PRN
  Administered 2019-11-20: 1000 mg via INTRAVENOUS

## 2019-11-20 MED ORDER — FENTANYL CITRATE (PF) 100 MCG/2ML IJ SOLN
25.0000 ug | INTRAMUSCULAR | Status: DC | PRN
  Administered 2019-11-20 (×5): 25 ug via INTRAVENOUS

## 2019-11-20 MED ORDER — OXYCODONE-ACETAMINOPHEN 5-325 MG PO TABS: 1.0000 | ORAL_TABLET | Freq: Four times a day (QID) | ORAL | 0 refills | Status: AC | PRN

## 2019-11-20 MED ORDER — SODIUM CHLORIDE 0.9 % IV SOLN
INTRAVENOUS | Status: DC | PRN
  Administered 2019-11-20: 50 ug/min via INTRAVENOUS

## 2019-11-20 MED ORDER — DEXAMETHASONE SODIUM PHOSPHATE 10 MG/ML IJ SOLN
INTRAMUSCULAR | Status: AC
  Filled 2019-11-20: qty 1

## 2019-11-20 MED ORDER — FAMOTIDINE 20 MG PO TABS
ORAL_TABLET | ORAL | Status: AC
  Administered 2019-11-20: 20 mg via ORAL
  Filled 2019-11-20: qty 1

## 2019-11-20 MED ORDER — FAMOTIDINE 20 MG PO TABS
20.0000 mg | ORAL_TABLET | Freq: Once | ORAL | Status: AC
  Administered 2019-11-20: 07:00:00 20 mg via ORAL

## 2019-11-20 MED ORDER — FENTANYL CITRATE (PF) 100 MCG/2ML IJ SOLN
INTRAMUSCULAR | Status: DC | PRN
  Administered 2019-11-20: 50 ug via INTRAVENOUS

## 2019-11-20 MED ORDER — PHENYLEPHRINE HCL (PRESSORS) 10 MG/ML IV SOLN
INTRAVENOUS | Status: DC | PRN
  Administered 2019-11-20 (×2): 200 ug via INTRAVENOUS
  Administered 2019-11-20: 100 ug via INTRAVENOUS
  Administered 2019-11-20: 200 ug via INTRAVENOUS

## 2019-11-20 MED ORDER — ROCURONIUM BROMIDE 100 MG/10ML IV SOLN
INTRAVENOUS | Status: DC | PRN
  Administered 2019-11-20: 10 mg via INTRAVENOUS
  Administered 2019-11-20: 15 mg via INTRAVENOUS
  Administered 2019-11-20: 30 mg via INTRAVENOUS
  Administered 2019-11-20: 5 mg via INTRAVENOUS

## 2019-11-20 MED ORDER — PAPAVERINE HCL 30 MG/ML IJ SOLN
INTRAMUSCULAR | Status: AC
  Filled 2019-11-20: qty 2

## 2019-11-20 MED ORDER — PROPOFOL 500 MG/50ML IV EMUL
INTRAVENOUS | Status: AC
  Filled 2019-11-20: qty 50

## 2019-11-20 MED ORDER — ONDANSETRON HCL 4 MG/2ML IJ SOLN
INTRAMUSCULAR | Status: AC
  Filled 2019-11-20: qty 2

## 2019-11-20 MED ORDER — FENTANYL CITRATE (PF) 100 MCG/2ML IJ SOLN
INTRAMUSCULAR | Status: AC
  Administered 2019-11-20: 25 ug via INTRAVENOUS
  Filled 2019-11-20: qty 2

## 2019-11-20 MED ORDER — BUPIVACAINE HCL (PF) 0.5 % IJ SOLN
INTRAMUSCULAR | Status: DC | PRN
  Administered 2019-11-20: 10 mL

## 2019-11-20 MED ORDER — ONDANSETRON HCL 4 MG/2ML IJ SOLN
INTRAMUSCULAR | Status: DC | PRN
  Administered 2019-11-20: 4 mg via INTRAVENOUS

## 2019-11-20 MED ORDER — BUPIVACAINE HCL (PF) 0.5 % IJ SOLN
INTRAMUSCULAR | Status: AC
  Filled 2019-11-20: qty 30

## 2019-11-20 MED ORDER — SUGAMMADEX SODIUM 500 MG/5ML IV SOLN
INTRAVENOUS | Status: AC
  Filled 2019-11-20: qty 5

## 2019-11-20 MED ORDER — LIDOCAINE HCL (CARDIAC) PF 100 MG/5ML IV SOSY
PREFILLED_SYRINGE | INTRAVENOUS | Status: DC | PRN
  Administered 2019-11-20: 100 mg via INTRAVENOUS

## 2019-11-20 MED ORDER — MIDAZOLAM HCL 2 MG/2ML IJ SOLN
INTRAMUSCULAR | Status: AC
  Filled 2019-11-20: qty 2

## 2019-11-20 MED ORDER — ACETAMINOPHEN 10 MG/ML IV SOLN
INTRAVENOUS | Status: AC
  Filled 2019-11-20: qty 100

## 2019-11-20 MED ORDER — DEXAMETHASONE SODIUM PHOSPHATE 10 MG/ML IJ SOLN
INTRAMUSCULAR | Status: DC | PRN
  Administered 2019-11-20: 4 mg via INTRAVENOUS

## 2019-11-20 MED ORDER — IPRATROPIUM-ALBUTEROL 0.5-2.5 (3) MG/3ML IN SOLN
RESPIRATORY_TRACT | Status: AC
  Administered 2019-11-20: 3 mL via RESPIRATORY_TRACT
  Filled 2019-11-20: qty 3

## 2019-11-20 MED ORDER — CHLORHEXIDINE GLUCONATE CLOTH 2 % EX PADS
6.0000 | MEDICATED_PAD | Freq: Once | CUTANEOUS | Status: AC
  Administered 2019-11-20: 6 via TOPICAL

## 2019-11-20 MED ORDER — SUCCINYLCHOLINE CHLORIDE 20 MG/ML IJ SOLN
INTRAMUSCULAR | Status: AC
  Filled 2019-11-20: qty 1

## 2019-11-20 MED ORDER — CEFAZOLIN SODIUM-DEXTROSE 1-4 GM/50ML-% IV SOLN
1.0000 g | INTRAVENOUS | Status: AC
  Administered 2019-11-20: 2 g via INTRAVENOUS

## 2019-11-20 MED ORDER — MIDAZOLAM HCL 2 MG/2ML IJ SOLN
INTRAMUSCULAR | Status: DC | PRN
  Administered 2019-11-20: 2 mg via INTRAVENOUS

## 2019-11-20 MED ORDER — SUCCINYLCHOLINE CHLORIDE 20 MG/ML IJ SOLN
INTRAMUSCULAR | Status: DC | PRN
  Administered 2019-11-20: 140 mg via INTRAVENOUS

## 2019-11-20 MED ORDER — PHENYLEPHRINE HCL (PRESSORS) 10 MG/ML IV SOLN
INTRAVENOUS | Status: AC
  Filled 2019-11-20: qty 1

## 2019-11-20 SURGICAL SUPPLY — 55 items
APPLIER CLIP 11 MED OPEN (CLIP)
APPLIER CLIP 9.375 SM OPEN (CLIP) ×2
BAG DECANTER FOR FLEXI CONT (MISCELLANEOUS) ×2 IMPLANT
BLADE SURG SZ11 CARB STEEL (BLADE) ×2 IMPLANT
BOOT SUTURE AID YELLOW STND (SUTURE) ×2 IMPLANT
BRUSH SCRUB EZ  4% CHG (MISCELLANEOUS) ×1
BRUSH SCRUB EZ 4% CHG (MISCELLANEOUS) ×1 IMPLANT
CANISTER SUCT 1200ML W/VALVE (MISCELLANEOUS) ×2 IMPLANT
CHLORAPREP W/TINT 26 (MISCELLANEOUS) ×2 IMPLANT
CLIP APPLIE 11 MED OPEN (CLIP) IMPLANT
CLIP APPLIE 9.375 SM OPEN (CLIP) ×1 IMPLANT
COVER WAND RF STERILE (DRAPES) ×2 IMPLANT
DERMABOND ADVANCED (GAUZE/BANDAGES/DRESSINGS) ×1
DERMABOND ADVANCED .7 DNX12 (GAUZE/BANDAGES/DRESSINGS) ×1 IMPLANT
DRESSING SURGICEL FIBRLLR 1X2 (HEMOSTASIS) ×1 IMPLANT
DRSG SURGICEL FIBRILLAR 1X2 (HEMOSTASIS) ×2
ELECT CAUTERY BLADE 6.4 (BLADE) ×2 IMPLANT
ELECT REM PT RETURN 9FT ADLT (ELECTROSURGICAL) ×2
ELECTRODE REM PT RTRN 9FT ADLT (ELECTROSURGICAL) ×1 IMPLANT
GAUZE 4X4 16PLY RFD (DISPOSABLE) ×2 IMPLANT
GEL ULTRASOUND 20GR AQUASONIC (MISCELLANEOUS) IMPLANT
GLOVE BIO SURGEON STRL SZ7 (GLOVE) ×4 IMPLANT
GLOVE INDICATOR 7.5 STRL GRN (GLOVE) ×4 IMPLANT
GLOVE SURG SYN 8.0 (GLOVE) ×2 IMPLANT
GOWN STRL REUS W/ TWL LRG LVL3 (GOWN DISPOSABLE) ×2 IMPLANT
GOWN STRL REUS W/ TWL XL LVL3 (GOWN DISPOSABLE) ×1 IMPLANT
GOWN STRL REUS W/TWL LRG LVL3 (GOWN DISPOSABLE) ×2
GOWN STRL REUS W/TWL XL LVL3 (GOWN DISPOSABLE) ×1
IV NS 500ML (IV SOLUTION) ×1
IV NS 500ML BAXH (IV SOLUTION) ×1 IMPLANT
KIT TURNOVER KIT A (KITS) ×2 IMPLANT
LABEL OR SOLS (LABEL) ×2 IMPLANT
LOOP RED MAXI  1X406MM (MISCELLANEOUS) ×1
LOOP VESSEL MAXI 1X406 RED (MISCELLANEOUS) ×1 IMPLANT
LOOP VESSEL MINI 0.8X406 BLUE (MISCELLANEOUS) ×2 IMPLANT
LOOPS BLUE MINI 0.8X406MM (MISCELLANEOUS) ×2
NEEDLE FILTER BLUNT 18X 1/2SAF (NEEDLE)
NEEDLE FILTER BLUNT 18X1 1/2 (NEEDLE) IMPLANT
NEEDLE HYPO 30X.5 LL (NEEDLE) ×2 IMPLANT
PACK EXTREMITY ARMC (MISCELLANEOUS) ×2 IMPLANT
PAD PREP 24X41 OB/GYN DISP (PERSONAL CARE ITEMS) ×2 IMPLANT
STOCKINETTE STRL 4IN 9604848 (GAUZE/BANDAGES/DRESSINGS) ×2 IMPLANT
SUT MNCRL+ 5-0 UNDYED PC-3 (SUTURE) ×1 IMPLANT
SUT MONOCRYL 5-0 (SUTURE) ×1
SUT PROLENE 6 0 BV (SUTURE) ×8 IMPLANT
SUT SILK 2 0 (SUTURE) ×1
SUT SILK 2-0 18XBRD TIE 12 (SUTURE) ×1 IMPLANT
SUT SILK 3 0 (SUTURE) ×1
SUT SILK 3-0 18XBRD TIE 12 (SUTURE) ×1 IMPLANT
SUT SILK 4 0 (SUTURE)
SUT SILK 4-0 18XBRD TIE 12 (SUTURE) IMPLANT
SUT VIC AB 3-0 SH 27 (SUTURE) ×1
SUT VIC AB 3-0 SH 27X BRD (SUTURE) ×1 IMPLANT
SYR 20ML LL LF (SYRINGE) ×2 IMPLANT
SYR 3ML LL SCALE MARK (SYRINGE) IMPLANT

## 2019-11-20 NOTE — Op Note (Signed)
     OPERATIVE NOTE   PROCEDURE: right brachial cephalic arteriovenous fistula placement  PRE-OPERATIVE DIAGNOSIS: End Stage Renal Disease  POST-OPERATIVE DIAGNOSIS: End Stage Renal Disease  SURGEON: [name]  ASSISTANT(S): Ms. Hezzie Bump  ANESTHESIA: general  ESTIMATED BLOOD LOSS: <50 cc  FINDING(S): 4 mm cephalic vein  SPECIMEN(S):  none  INDICATIONS:   [name] is a 53 y.o. male who presents with end stage renal disease.  The patient is scheduled for right brachiocephalic arteriovenous fistula placement.  The patient is aware the risks include but are not limited to: bleeding, infection, steal syndrome, nerve damage, ischemic monomelic neuropathy, failure to mature, and need for additional procedures.  The patient is aware of the risks of the procedure and elects to proceed forward.  DESCRIPTION: After full informed written consent was obtained from the patient, the patient was brought back to the operating room and placed supine upon the operating table.  Prior to induction, the patient received IV antibiotics.   After obtaining adequate anesthesia, the patient was then prepped and draped in the standard fashion for a right arm access procedure.   A first assistant was required to provide a safe and appropriate environment for executing the surgery.  The assistant was integral in providing retraction, exposure, running suture providing suction and in the closing process.   A curvilinear incision was then created midway between the radial impulse and the cephalic vein. The cephalic vein was then identified and dissected circumferentially. It was marked with a surgical marker.    Attention was then turned to the brachial artery which was exposed through the same incision and looped proximally and distally. Side branches were controlled with 4-0 silk ties.  The distal segment of the vein was ligated with a  2-0 silk, and the vein was transected.  The  proximal segment was interrogated with serial dilators.  The vein accepted up to a 4 mm dilator without any difficulty. Heparinized saline was infused into the vein and clamped it with a small bulldog.  At this point, I reset my exposure of the brachial artery and controlled the artery with vessel loops proximally and distally.  An arteriotomy was then made with a #11 blade, and extended with a Potts scissor.  Heparinized saline was injected proximal and distal into the radial artery.  The vein was then approximated to the artery while the artery was in its native bed and subsequently the vein was beveled using Potts scissors. The vein was then sewn to the artery in an end-to-side configuration with a running stitch of 6-0 Prolene.  Prior to completing this anastomosis Flushing maneuvers were performed and the artery was allowed to forward and back bleed.  There was no evidence of clot from any vessels.  I completed the anastomosis in the usual fashion and then released all vessel loops and clamps.    There was good  thrill in the venous outflow, and there was 1+ palpable radial pulse.  At this point, I irrigated out the surgical wound.  There was no further active bleeding.  The subcutaneous tissue was reapproximated with a running stitch of 3-0 Vicryl.  The skin was then reapproximated with a running subcuticular stitch of 4-0 Vicryl.  The skin was then cleaned, dried, and reinforced with Dermabond.    The patient tolerated this procedure well.   COMPLICATIONS: None  CONDITION: [name]  Office: [phone]   11/20/2019, 9:35 AM

## 2019-11-20 NOTE — Transfer of Care (Signed)
Immediate Anesthesia Transfer of Care Note  Patient: [name]  Procedure(s) Performed: ARTERIOVENOUS (AV) FISTULA CREATION ( BRACHIAL CEPHALIC ) (Right )  Patient Location: PACU  Anesthesia Type:General  Level of Consciousness: drowsy  Airway & Oxygen Therapy: Patient Spontanous Breathing and Patient connected to face mask oxygen  Post-op Assessment: Report given to RN and Post -op Vital signs reviewed and stable  Post vital signs: Reviewed and stable  Last Vitals:  Vitals Value Taken Time  BP 130/71 11/20/19 1026  Temp    Pulse 94 11/20/19 1029  Resp 18 11/20/19 1029  SpO2 96 % 11/20/19 1029  Vitals shown include unvalidated device data.  Last Pain:  Vitals:   11/20/19 0627  TempSrc: Tympanic  PainSc: 0-No pain         Complications: No apparent anesthesia complications

## 2019-11-20 NOTE — Anesthesia Postprocedure Evaluation (Signed)
Anesthesia Post Note  Patient: [name]  Procedure(s) Performed: ARTERIOVENOUS (AV) FISTULA CREATION ( BRACHIAL CEPHALIC ) (Right )  Patient location during evaluation: PACU Anesthesia Type: General Level of consciousness: awake and alert Pain management: pain level controlled Vital Signs Assessment: post-procedure vital signs reviewed and stable Respiratory status: spontaneous breathing and respiratory function stable Cardiovascular status: stable Anesthetic complications: no     Last Vitals:  Vitals:   11/20/19 1056 11/20/19 1111  BP: 122/71 117/69  Pulse: 88 86  Resp: (!) 23 11  Temp:    SpO2: 91% 91%    Last Pain:  Vitals:   11/20/19 1111  TempSrc:   PainSc: Asleep                 KEPHART,WILLIAM K

## 2019-11-20 NOTE — Anesthesia Procedure Notes (Signed)
Procedure Name: Intubation Date/Time: 11/20/2019 7:47 AM Performed by: Leeroy Cha, CRNA Pre-anesthesia Checklist: Patient identified, Emergency Drugs available, Suction available and Patient being monitored Patient Re-evaluated:Patient Re-evaluated prior to induction Oxygen Delivery Method: Circle system utilized Preoxygenation: Pre-oxygenation with 100% oxygen Induction Type: IV induction Ventilation: Mask ventilation without difficulty Laryngoscope Size: McGraph and 4 Grade View: Grade I Tube type: Oral Tube size: 7.5 mm Number of attempts: 1 Airway Equipment and Method: Stylet,  Oral airway and Video-laryngoscopy Placement Confirmation: ETT inserted through vocal cords under direct vision,  positive ETCO2 and breath sounds checked- equal and bilateral Secured at: 20 cm Tube secured with: Tape Dental Injury: Teeth and Oropharynx as per pre-operative assessment  Difficulty Due To: Difficulty was anticipated and Difficult Airway- due to limited oral opening

## 2019-11-20 NOTE — Discharge Instructions (Signed)

## 2019-11-20 NOTE — H&P (Signed)
[facility] History & Physical Update  The patient was interviewed and re-examined.  The patient's previous History and Physical has been reviewed and is unchanged.  There is no change in the plan of care. We plan to proceed with the scheduled procedure.  [name], MD  11/20/2019, 7:25 AM

## 2019-11-20 NOTE — Anesthesia Preprocedure Evaluation (Addendum)
Anesthesia Evaluation  Patient identified by MRN, date of birth, ID band Patient awake    Reviewed: Allergy & Precautions, NPO status , Patient's Chart, lab work & pertinent test results  History of Anesthesia Complications Negative for: history of anesthetic complications  Airway Mallampati: III       Dental   Pulmonary sleep apnea, Continuous Positive Airway Pressure Ventilation and Oxygen sleep apnea , COPD,  COPD inhaler and oxygen dependent, Not current smoker,           Cardiovascular hypertension, + CAD, + Cardiac Stents, +CHF and + Orthopnea  (-) dysrhythmias (-) Valvular Problems/Murmurs     Neuro/Psych neg Seizures    GI/Hepatic Neg liver ROS, GERD  ,  Endo/Other  diabetes, Type 2, Oral Hypoglycemic AgentsMorbid obesity  Renal/GU DialysisRenal disease     Musculoskeletal   Abdominal   Peds  Hematology   Anesthesia Other Findings   Reproductive/Obstetrics                            Anesthesia Physical Anesthesia Plan  ASA: IV  Anesthesia Plan: General   Post-op Pain Management:    Induction: Intravenous  PONV Risk Score and Plan: 2 and Ondansetron and Midazolam  Airway Management Planned: Oral ETT  Additional Equipment:   Intra-op Plan:   Post-operative Plan:   Informed Consent: I have reviewed the patients History and Physical, chart, labs and discussed the procedure including the risks, benefits and alternatives for the proposed anesthesia with the patient or authorized representative who has indicated his/her understanding and acceptance.       Plan Discussed with:   Anesthesia Plan Comments:         Anesthesia Quick Evaluation

## 2019-11-20 NOTE — Anesthesia Post-op Follow-up Note (Signed)
Anesthesia QCDR form completed.        

## 2019-11-21 ENCOUNTER — Emergency Department: Payer: Medicare Other

## 2019-11-21 ENCOUNTER — Other Ambulatory Visit: Payer: Self-pay

## 2019-11-21 ENCOUNTER — Emergency Department
Admission: EM | Admit: 2019-11-21 | Discharge: 2019-11-21 | Disposition: A | Discharge status: Home / Self Care | Payer: Medicare Other | Attending: Emergency Medicine | Admitting: Emergency Medicine

## 2019-11-21 ENCOUNTER — Encounter: Payer: Self-pay | Admitting: Emergency Medicine

## 2019-11-21 DIAGNOSIS — Z955 Presence of coronary angioplasty implant and graft: Secondary | ICD-10-CM | POA: Insufficient documentation

## 2019-11-21 DIAGNOSIS — G8918 Other acute postprocedural pain: Secondary | ICD-10-CM

## 2019-11-21 DIAGNOSIS — I5032 Chronic diastolic (congestive) heart failure: Secondary | ICD-10-CM | POA: Diagnosis not present

## 2019-11-21 DIAGNOSIS — Z794 Long term (current) use of insulin: Secondary | ICD-10-CM | POA: Insufficient documentation

## 2019-11-21 DIAGNOSIS — M79601 Pain in right arm: Secondary | ICD-10-CM | POA: Insufficient documentation

## 2019-11-21 DIAGNOSIS — I132 Hypertensive heart and chronic kidney disease with heart failure and with stage 5 chronic kidney disease, or end stage renal disease: Secondary | ICD-10-CM | POA: Diagnosis not present

## 2019-11-21 DIAGNOSIS — E1122 Type 2 diabetes mellitus with diabetic chronic kidney disease: Secondary | ICD-10-CM | POA: Insufficient documentation

## 2019-11-21 DIAGNOSIS — I2510 Atherosclerotic heart disease of native coronary artery without angina pectoris: Secondary | ICD-10-CM | POA: Diagnosis not present

## 2019-11-21 DIAGNOSIS — Z7982 Long term (current) use of aspirin: Secondary | ICD-10-CM | POA: Diagnosis not present

## 2019-11-21 DIAGNOSIS — N185 Chronic kidney disease, stage 5: Secondary | ICD-10-CM | POA: Insufficient documentation

## 2019-11-21 DIAGNOSIS — Z79899 Other long term (current) drug therapy: Secondary | ICD-10-CM | POA: Diagnosis not present

## 2019-11-21 LAB — BASIC METABOLIC PANEL
Anion gap: 11 (ref 5–15)
BUN: 58 mg/dL — ABNORMAL HIGH (ref 6–20)
CO2: 22 mmol/L (ref 22–32)
Calcium: 8.6 mg/dL — ABNORMAL LOW (ref 8.9–10.3)
Chloride: 98 mmol/L (ref 98–111)
Creatinine, Ser: 6.51 mg/dL — ABNORMAL HIGH (ref 0.61–1.24)
GFR calc Af Amer: 10 mL/min — ABNORMAL LOW (ref >60)
GFR calc non Af Amer: 9 mL/min — ABNORMAL LOW (ref >60)
Glucose, Bld: 315 mg/dL — ABNORMAL HIGH (ref 70–99)
Potassium: 4.2 mmol/L (ref 3.5–5.1)
Sodium: 131 mmol/L — ABNORMAL LOW (ref 135–145)

## 2019-11-21 LAB — CBC WITH DIFFERENTIAL/PLATELET
Abs Immature Granulocytes: 0.23 10*3/uL — ABNORMAL HIGH (ref 0.00–0.07)
Basophils Absolute: 0 10*3/uL (ref 0.0–0.1)
Basophils Relative: 0 %
Eosinophils Absolute: 0.6 10*3/uL — ABNORMAL HIGH (ref 0.0–0.5)
Eosinophils Relative: 4 %
HCT: 38.2 % — ABNORMAL LOW (ref 39.0–52.0)
Hemoglobin: 12.1 g/dL — ABNORMAL LOW (ref 13.0–17.0)
Immature Granulocytes: 2 %
Lymphocytes Relative: 17 %
Lymphs Abs: 2.5 10*3/uL (ref 0.7–4.0)
MCH: 26.4 pg (ref 26.0–34.0)
MCHC: 31.7 g/dL (ref 30.0–36.0)
MCV: 83.2 fL (ref 80.0–100.0)
Monocytes Absolute: 0.9 10*3/uL (ref 0.1–1.0)
Monocytes Relative: 7 %
Neutro Abs: 9.9 10*3/uL — ABNORMAL HIGH (ref 1.7–7.7)
Neutrophils Relative %: 70 %
Platelets: 207 10*3/uL (ref 150–400)
RBC: 4.59 MIL/uL (ref 4.22–5.81)
RDW: 14.2 % (ref 11.5–15.5)
WBC: 14.2 10*3/uL — ABNORMAL HIGH (ref 4.0–10.5)
nRBC: 0 % (ref 0.0–0.2)

## 2019-11-21 MED ORDER — HYDROMORPHONE HCL 1 MG/ML IJ SOLN
2.0000 mg | Freq: Once | INTRAMUSCULAR | Status: AC
  Administered 2019-11-21: 2 mg via INTRAMUSCULAR
  Filled 2019-11-21: qty 2

## 2019-11-21 MED ORDER — HYDROMORPHONE HCL 1 MG/ML IJ SOLN: 1.0000 mg | Freq: Once | INTRAMUSCULAR | Status: AC

## 2019-11-21 NOTE — ED Triage Notes (Addendum)
States had fistula placed R arm yesterday. States had bleeding at home. No bleeding at present. States is painful. Radial pulse present. hand warm and dry with pink nailbeds, with 2 sec CRT. Bruit auscultated.

## 2019-11-21 NOTE — ED Triage Notes (Signed)
FIRST NURSE NOTE- pt had fistula placed to right arm yesterday.  Severe pain. Told by on call vascular doctor to come in to ED

## 2019-11-21 NOTE — Discharge Instructions (Addendum)
Your examination and ultrasound today were reassuring.  Continue taking your pain medicine and following your surgery instructions and follow-up with your primary care doctor and surgeon.  If pain worsens at home and is not improving 30 minutes after taking 2 oxycodone tablets, it is okay to take one additional dose of 2 tablets.[name]

## 2019-11-21 NOTE — ED Provider Notes (Signed)
[facility] Emergency Department Provider Note  ____________________________________________  Time seen: Approximately 10:39 PM  I have reviewed the triage vital signs and the nursing notes.   HISTORY  Chief Complaint Post-op Problem    HPI [name] is a 53 y.o. male with a history of CHF COPD diabetes hypertension end-stage renal disease on dialysis who comes the ED complaining of right arm pain.  He underwent surgery yesterday for creation of a right upper extremity AV fistula.  Went home afterward, and has had gradual worsening of the pain since the surgery.  It is constant, worse with movement, no alleviating factors.  Nonradiating.  Denies hand numbness or tingling or coldness or weakness.  No chest pain shortness of breath or fever.      Past Medical History:  Diagnosis Date  . Bell's palsy   . Bell's palsy   . CHF (congestive heart failure) ([place])   . Chronic kidney disease   . COPD (chronic obstructive pulmonary disease) ([place])   . Coronary artery disease   . Diabetes mellitus without complication ([place])   . Dyspnea    Wheezing  . GERD (gastroesophageal reflux disease)   . Gout   . HOH (hard of hearing)    mild  . Hypertension   . NSVT (nonsustained ventricular tachycardia) ([place])   . Obstructive sleep apnea    CPAP, O2 use continuosly at 3LPM  . Orthopnea   . Pneumonia    In Past X2 most recent 1 yr ago  . Psoriasis   . Psoriasis   . Pulmonary HTN ([place])   . Renal failure      Patient Active Problem List   Diagnosis Date Noted  . Hyperkalemia 06/15/2019  . Type 2 diabetes mellitus with hyperglycemia, with long-term current use of insulin ([name]) 03/05/2019  . Hypoglycemia 02/18/2019  . Altered mental status 02/18/2019  . RLQ abdominal mass 01/20/2019  . CHF (congestive heart failure) ([place]) 10/07/2018  . Muscle cramps 10/07/2018  . Shortness of breath 09/08/2018  . Morbid obesity ([place]) 06/24/2018  . Acute-on-chronic kidney  injury ([place]) 04/28/2018  . Acute on chronic respiratory failure with hypoxia and hypercapnia ([place]) 04/26/2018  . Morbid obesity with BMI of 50.0-59.9, adult ([place]) 04/23/2018  . Chronic diastolic heart failure ([place]) 02/10/2018  . Obstructive sleep apnea 02/10/2018  . Diabetes mellitus without complication ([place]) 27/05/2375  . CAD (coronary artery disease) 01/31/2017  . Hyperlipidemia, unspecified 01/31/2017  . Essential hypertension 01/17/2017  . Psoriasis 01/17/2017  . Chronic gout of multiple sites 12/21/2016  . DM type 2 with diabetic peripheral neuropathy ([place]) 06/25/2016  . Coronary artery disease involving native coronary artery 06/14/2015  . Cardiomyopathy ([place]) 08/19/2012  . Chronic renal insufficiency, stage 5 ([place]) 08/19/2012     Past Surgical History:  Procedure Laterality Date  . AV FISTULA PLACEMENT Right 11/20/2019   Procedure: ARTERIOVENOUS (AV) FISTULA CREATION ( BRACHIAL CEPHALIC );  Surgeon: Katha Cabal, MD;  Location: [facility] ORS;  Service: Vascular;  Laterality: Right;  . CORONARY ANGIOPLASTY WITH STENT PLACEMENT     stent placement  . DIALYSIS/PERMA CATHETER INSERTION N/A 06/22/2019   Procedure: DIALYSIS/PERMA CATHETER INSERTION;  Surgeon: Algernon Huxley, MD;  Location: [facility];  Service: Cardiovascular;  Laterality: N/A;     Prior to Admission medications   Medication Sig Start Date End Date Taking? Authorizing Provider  albuterol (PROVENTIL HFA;VENTOLIN HFA) 108 (90 Base) MCG/ACT inhaler Inhale 2 puffs into the lungs every 4 (four) hours as  needed for wheezing or shortness of breath. 01/20/19   Doles-Johnson, Teah, NP  allopurinol (ZYLOPRIM) 300 MG tablet Take 1 tablet (300 mg total) by mouth daily. 04/30/19   Lavera Guise, MD  aspirin EC 81 MG tablet Take 1 tablet (81 mg total) by mouth daily. 06/02/19   Lavera Guise, MD  atorvastatin (LIPITOR) 40 MG tablet Take 1 tablet (40 mg total) by mouth at bedtime. 04/30/19   Lavera Guise, MD  carvedilol  (COREG) 25 MG tablet Take 1 tablet (25 mg total) by mouth every 12 (twelve) hours. 04/30/19   Lavera Guise, MD  Cholecalciferol (D3-1000) 25 MCG (1000 UT) tablet Take 2 tablets (2,000 Units total) by mouth daily. 01/20/19   Doles-Johnson, Teah, NP  Dulaglutide (TRULICITY) 1.5 VO/5.3GU SOPN Use as directed once a week diag e11.65 Patient taking differently: Inject 1.5 mg into the skin every 7 (seven) days.  08/18/19   Ronnell Freshwater, NP  furosemide (LASIX) 40 MG tablet Take 1.5 tablets (60 mg total) by mouth 2 (two) times daily. 07/22/19   Kendell Bane, NP  gabapentin (NEURONTIN) 300 MG capsule TAKE (1) CAPSULE BY MOUTH TWICE DAILY. Patient taking differently: Take 300 mg by mouth See admin instructions. Take 300 mg by mouth twice daily on Tuesday, Thursday, Saturday and Sunday, take 300 mg once daily on Monday Wednesday and Friday 09/16/19   Kendell Bane, NP  Sanjuan Dame 17 GM/SCOOP powder Take 1 Container by mouth as needed for moderate constipation.  06/29/19   [provider]  hydrALAZINE (APRESOLINE) 25 MG tablet Take one tab po bid for blood pressure Patient taking differently: Take 25 mg by mouth 2 (two) times daily.  08/18/19   Scarboro, Audie Clear, NP  insulin degludec (TRESIBA FLEXTOUCH) 100 UNIT/ML SOPN FlexTouch Pen Inject 8 units at bed time Patient taking differently: Inject 16 Units into the skin at bedtime.  06/25/19   Stark Jock Jude, MD  isosorbide mononitrate (IMDUR) 60 MG 24 hr tablet Take 1 tablet (60 mg total) by mouth daily. 07/22/19   Kendell Bane, NP  Lido-Capsaicin-Men-Methyl Sal (1ST MEDX-PATCH/ LIDOCAINE) 4-0.025-5-20 % PTCH Apply 1 patch topically daily. Patient not taking: Reported on 11/05/2019 09/08/19   Kendell Bane, NP  Multiple Vitamins-Minerals (TAB-A-VITE) TABS TAKE 1 TABLET BY MOUTH ONCE DAILY. Patient taking differently: Take 1 tablet by mouth daily.  09/16/19   Kendell Bane, NP  multivitamin (RENA-VIT) TABS tablet Take 1 tablet by mouth at bedtime. Patient  not taking: Reported on 11/05/2019 06/25/19   Otila Back, MD  Nutritional Supplements (FEEDING SUPPLEMENT, NEPRO CARB STEADY,) LIQD Take 237 mLs by mouth 2 (two) times daily between meals. Patient not taking: Reported on 11/05/2019 06/25/19   Otila Back, MD  omeprazole (PRILOSEC) 20 MG capsule Take 1 capsule (20 mg total) by mouth 2 (two) times daily before a meal. Patient taking differently: Take 20 mg by mouth daily.  06/24/19   Lavera Guise, MD  oxyCODONE-acetaminophen (PERCOCET) 5-325 MG tablet Take 1-2 tablets by mouth every 6 (six) hours as needed for moderate pain or severe pain. 11/20/19 11/19/20  Schnier, Dolores Lory, MD  SKYRIZI, 150 MG DOSE, 75 MG/0.83ML PSKT Inject 150 mg into the skin See admin instructions. Inject 150 mg into the skin every 4 months 06/24/19   [provider]  traZODone (DESYREL) 100 MG tablet Take one or one and half tab at night for sleep Patient not taking: Reported on 11/05/2019 07/15/19   Lavera Guise,  MD  Triamcinolone Acetonide (TRIAMCINOLONE 0.1 % CREAM : EUCERIN) CREA Apply 1 application topically 2 (two) times daily. Patient not taking: Reported on 11/05/2019 08/12/19   Kendell Bane, NP  triamcinolone ointment (KENALOG) 0.5 % Apply 1 application topically 2 (two) times daily. 08/12/19   Kendell Bane, NP  umeclidinium-vilanterol (ANORO ELLIPTA) 62.5-25 MCG/INH AEPB Inhale 1 puff into the lungs daily. 10/13/19   Kendell Bane, NP     Allergies Shellfish allergy   Family History  Problem Relation Age of Onset  . Heart failure Mother   . Kidney failure Brother     Social History Social History   Tobacco Use  . Smoking status: Never Smoker  . Smokeless tobacco: Never Used  Substance Use Topics  . Alcohol use: Not Currently    Alcohol/week: 1.0 standard drinks    Types: 1 Cans of beer per week  . Drug use: Yes    Types: Marijuana    Comment: in high school     Review of Systems  Constitutional:   No fever or chills.  ENT:   No sore  throat. No rhinorrhea. Cardiovascular:   No chest pain or syncope. Respiratory:   No dyspnea or cough. Gastrointestinal:   Negative for abdominal pain, vomiting and diarrhea.  Musculoskeletal:   Right arm pain as above All other systems reviewed and are negative except as documented above in ROS and HPI.  ____________________________________________   PHYSICAL EXAM:  VITAL SIGNS: ED Triage Vitals  Enc Vitals Group     BP 11/21/19 1812 137/80     Pulse Rate 11/21/19 1812 71     Resp 11/21/19 1812 20     Temp 11/21/19 1812 98.5 F (36.9 C)     Temp Source 11/21/19 1812 Oral     SpO2 11/21/19 1812 97 %     Weight 11/21/19 1814 265 lb (120.2 kg)     Height 11/21/19 1814 5\' 3"  (1.6 m)     Head Circumference --      Peak Flow --      Pain Score 11/21/19 1814 10     Pain Loc --      Pain Edu? --      Excl. in [place]? --     Vital signs reviewed, nursing assessments reviewed.   Constitutional:   Alert and oriented. Non-toxic appearance. Eyes:   Conjunctivae are normal. EOMI.  ENT      Head:   Normocephalic and atraumatic.      Nose:   Wearing a mask.      Mouth/Throat:   Wearing a mask.      Neck:   No meningismus. Full ROM. Hematological/Lymphatic/Immunilogical:   No cervical lymphadenopathy. Cardiovascular:   RRR. Symmetric bilateral radial pulses.  No murmurs. Cap refill less than 2 seconds.  Audible bruit over the AV fistula Respiratory:   Normal respiratory effort without tachypnea/retractions. Breath sounds are clear and equal bilaterally. No wheezes/rales/rhonchi. Gastrointestinal:   Soft and nontender. Non distended. There is no CVA tenderness.  No rebound, rigidity, or guarding.  Musculoskeletal:   Normal range of motion in all extremities. No joint effusions.  No lower extremity tenderness.  No edema.  There is expected edema and ecchymosis around the operative incision of the right upper extremity.  Tissues are soft.  No crepitus, not warm or fluctuant.  No  drainage. Neurologic:   Normal speech and language.  Motor grossly intact.  Intact sensation and strength in the distal right upper extremity.  No acute focal neurologic deficits are appreciated.  Skin:    Skin is warm, dry and intact.  Surgical incision healing as expected.  No rash noted.  No petechiae, purpura, or bullae.  ____________________________________________    LABS (pertinent positives/negatives) (all labs ordered are listed, but only abnormal results are displayed) Labs Reviewed  CBC WITH DIFFERENTIAL/PLATELET - Abnormal; Notable for the following components:      Result Value   WBC 14.2 (*)    Hemoglobin 12.1 (*)    HCT 38.2 (*)    Neutro Abs 9.9 (*)    Eosinophils Absolute 0.6 (*)    Abs Immature Granulocytes 0.23 (*)    All other components within normal limits  BASIC METABOLIC PANEL - Abnormal; Notable for the following components:   Sodium 131 (*)    Glucose, Bld 315 (*)    BUN 58 (*)    Creatinine, Ser 6.51 (*)    Calcium 8.6 (*)    GFR calc non Af Amer 9 (*)    GFR calc Af Amer 10 (*)    All other components within normal limits   ____________________________________________   EKG    ____________________________________________    RADIOLOGY  US Venous Img Upper Uni Right(dvt)  Result Date: 11/21/2019 CLINICAL DATA:  Right arm pain, status post fistula EXAM: UPPER EXTREMITY VENOUS DOPPLER ULTRASOUND TECHNIQUE: Gray-scale sonography with graded compression, as well as color Doppler and duplex ultrasound were performed to evaluate the upper extremity deep venous system from the level of the subclavian vein and including the jugular, axillary, basilic, radial, ulnar and upper cephalic vein. Spectral Doppler was utilized to evaluate flow at rest and with distal augmentation maneuvers. COMPARISON:  None. FINDINGS: Contralateral Subclavian Vein: Respiratory phasicity is normal and symmetric with the symptomatic side. No evidence of thrombus. Normal  compressibility. Internal Jugular Vein: No evidence of thrombus. Normal compressibility, respiratory phasicity and response to augmentation. Subclavian Vein: No evidence of thrombus. Normal compressibility, respiratory phasicity and response to augmentation. Axillary Vein: No evidence of thrombus. Normal compressibility, respiratory phasicity and response to augmentation. Cephalic Vein: No evidence of thrombus. Normal compressibility, respiratory phasicity and response to augmentation. Basilic Vein: No evidence of thrombus. Normal compressibility, respiratory phasicity and response to augmentation. Brachial Veins: No evidence of thrombus. Normal compressibility, respiratory phasicity and response to augmentation. Radial Veins: No evidence of thrombus. Normal compressibility, respiratory phasicity and response to augmentation. Ulnar Veins: No evidence of thrombus. Normal compressibility, respiratory phasicity and response to augmentation. Venous Reflux:  None visualized. Other Findings:  Flow seen within the site of the AV fistula. IMPRESSION: No evidence of DVT within the right upper extremity. Electronically Signed   By: Prudencio Pair M.D.   On: 11/21/2019 22:14    ____________________________________________   PROCEDURES Procedures  ____________________________________________    CLINICAL IMPRESSION / ASSESSMENT AND PLAN / ED COURSE  Medications ordered in the ED: Medications  HYDROmorphone (DILAUDID) injection 1 mg ( Intravenous See Alternative 11/21/19 2039)    Or  HYDROmorphone (DILAUDID) injection 2 mg (2 mg Intramuscular Given 11/21/19 2039)    Pertinent labs & imaging results that were available during my care of the patient were reviewed by me and considered in my medical decision making (see chart for details).  [name] was evaluated in Emergency Department on 11/21/2019 for the symptoms described in the history of present illness. He was evaluated in the context of the  global COVID-19 pandemic, which necessitated consideration that the patient might be at risk for infection  with the SARS-CoV-2 virus that causes COVID-19. Institutional protocols and algorithms that pertain to the evaluation of patients at risk for COVID-19 are in a state of rapid change based on information released by regulatory bodies including the CDC and federal and state organizations. These policies and algorithms were followed during the patient's care in the ED.   Patient presents with postoperative pain after creation of AV fistula in the right arm.  No evidence of any skin or soft tissue infection, no hemorrhage.  Doubt compartment syndrome or abscess or pseudoaneurysm.  No evidence of steal phenomenon or nerve injury.  Clinical exam is overall reassuring.  Vital signs unremarkable.  Ultrasound obtained which is negative for DVT or other acute findings.  Case discussed with vascular surgery Dr. Feliberto Gottron who recommends pain control only and outpatient follow-up.  After receiving 2 mg of IM Dilaudid in the ED, patient's pain is much better, he is stable for discharge.      ____________________________________________   FINAL CLINICAL IMPRESSION(S) / ED DIAGNOSES    Final diagnoses:  Arm pain, right  Postoperative pain     ED Discharge Orders    None      Portions of this note were generated with dragon dictation software. Dictation errors may occur despite best attempts at proofreading.   Carrie Mew, MD 11/21/19 2243

## 2019-11-23 ENCOUNTER — Telehealth (INDEPENDENT_AMBULATORY_CARE_PROVIDER_SITE_OTHER): Payer: Self-pay

## 2019-11-23 NOTE — Telephone Encounter (Signed)
Phyllis from [facility] had left a voicemail informing that she had reach out to the patient from his recent AV fistula procedure. The patient informed her that he had went to the ER on Saturday for excruciating pain and had a ultrasound done to the right arm. The ultrasound showed the fistula was working properly and not sure where the pain was coming from. The patient was giving dilaudid to help with pain but the medication wore off and the pain came back. The patient was told to increase the oxycodone to help with pain. The patient inform her that he was taking the medication like candy popping like starburst. The patient also informed that he having some bleeding and oozing form the surgical site. I have made Dr Delana Meyer aware with information and he recommend for the patient to be seen sooner. I left a message on the patient voicemail for him to return a call back to the office.

## 2019-11-24 ENCOUNTER — Other Ambulatory Visit: Payer: Self-pay | Admitting: Adult Health

## 2019-11-24 ENCOUNTER — Telehealth: Payer: Self-pay

## 2019-11-24 NOTE — Telephone Encounter (Signed)
Home health nurse called to let us know that pt was contacted to start care but she could not reach pt and will try again tomorrow.

## 2019-11-25 ENCOUNTER — Telehealth (INDEPENDENT_AMBULATORY_CARE_PROVIDER_SITE_OTHER): Payer: Self-pay

## 2019-11-25 ENCOUNTER — Other Ambulatory Visit: Payer: Self-pay

## 2019-11-25 DIAGNOSIS — I270 Primary pulmonary hypertension: Secondary | ICD-10-CM

## 2019-11-25 MED ORDER — ASPIRIN EC 81 MG PO TBEC: 81.0000 mg | DELAYED_RELEASE_TABLET | Freq: Every day | ORAL | 1 refills | Status: DC

## 2019-11-25 NOTE — Telephone Encounter (Signed)
Home health nurse left a message to inform there is a delay with start of care and will be starting home health on Thursday

## 2019-11-26 ENCOUNTER — Ambulatory Visit (INDEPENDENT_AMBULATORY_CARE_PROVIDER_SITE_OTHER): Payer: Medicare Other | Admitting: Podiatry

## 2019-11-26 ENCOUNTER — Encounter: Payer: Self-pay | Admitting: Anesthesiology

## 2019-11-26 ENCOUNTER — Encounter: Payer: Self-pay | Admitting: Podiatry

## 2019-11-26 ENCOUNTER — Other Ambulatory Visit: Payer: Medicare Other | Attending: Ophthalmology

## 2019-11-26 ENCOUNTER — Other Ambulatory Visit: Payer: Self-pay

## 2019-11-26 DIAGNOSIS — I5032 Chronic diastolic (congestive) heart failure: Secondary | ICD-10-CM | POA: Diagnosis not present

## 2019-11-26 DIAGNOSIS — N186 End stage renal disease: Secondary | ICD-10-CM | POA: Diagnosis not present

## 2019-11-26 DIAGNOSIS — L409 Psoriasis, unspecified: Secondary | ICD-10-CM | POA: Diagnosis not present

## 2019-11-26 DIAGNOSIS — Z48812 Encounter for surgical aftercare following surgery on the circulatory system: Secondary | ICD-10-CM | POA: Diagnosis not present

## 2019-11-26 DIAGNOSIS — I429 Cardiomyopathy, unspecified: Secondary | ICD-10-CM | POA: Diagnosis not present

## 2019-11-26 DIAGNOSIS — Z79891 Long term (current) use of opiate analgesic: Secondary | ICD-10-CM | POA: Diagnosis not present

## 2019-11-26 DIAGNOSIS — E1142 Type 2 diabetes mellitus with diabetic polyneuropathy: Secondary | ICD-10-CM

## 2019-11-26 DIAGNOSIS — B351 Tinea unguium: Secondary | ICD-10-CM | POA: Diagnosis not present

## 2019-11-26 DIAGNOSIS — E1165 Type 2 diabetes mellitus with hyperglycemia: Secondary | ICD-10-CM | POA: Diagnosis not present

## 2019-11-26 DIAGNOSIS — I872 Venous insufficiency (chronic) (peripheral): Secondary | ICD-10-CM

## 2019-11-26 DIAGNOSIS — I132 Hypertensive heart and chronic kidney disease with heart failure and with stage 5 chronic kidney disease, or end stage renal disease: Secondary | ICD-10-CM | POA: Diagnosis not present

## 2019-11-26 DIAGNOSIS — G4733 Obstructive sleep apnea (adult) (pediatric): Secondary | ICD-10-CM | POA: Diagnosis not present

## 2019-11-26 DIAGNOSIS — Z992 Dependence on renal dialysis: Secondary | ICD-10-CM | POA: Diagnosis not present

## 2019-11-26 DIAGNOSIS — M79676 Pain in unspecified toe(s): Secondary | ICD-10-CM | POA: Diagnosis not present

## 2019-11-26 DIAGNOSIS — Z95828 Presence of other vascular implants and grafts: Secondary | ICD-10-CM | POA: Diagnosis not present

## 2019-11-26 DIAGNOSIS — Z7951 Long term (current) use of inhaled steroids: Secondary | ICD-10-CM | POA: Diagnosis not present

## 2019-11-26 DIAGNOSIS — E1122 Type 2 diabetes mellitus with diabetic chronic kidney disease: Secondary | ICD-10-CM | POA: Diagnosis not present

## 2019-11-26 DIAGNOSIS — I2510 Atherosclerotic heart disease of native coronary artery without angina pectoris: Secondary | ICD-10-CM | POA: Diagnosis not present

## 2019-11-26 DIAGNOSIS — Z6841 Body Mass Index (BMI) 40.0 and over, adult: Secondary | ICD-10-CM | POA: Diagnosis not present

## 2019-11-26 DIAGNOSIS — Z794 Long term (current) use of insulin: Secondary | ICD-10-CM | POA: Diagnosis not present

## 2019-11-26 NOTE — Anesthesia Preprocedure Evaluation (Deleted)
Anesthesia Evaluation  Patient identified by MRN, date of birth, ID band Patient awake    Reviewed: Allergy & Precautions, NPO status , Patient's Chart, lab work & pertinent test results  History of Anesthesia Complications Negative for: history of anesthetic complications  Airway Mallampati: III       Dental   Pulmonary sleep apnea, Continuous Positive Airway Pressure Ventilation and Oxygen sleep apnea , COPD,  COPD inhaler and oxygen dependent, Not current smoker,   H/o moderate pulmonary HTN  RHC (03/2017): PA (mean) (mmHg) 43/32 (37)          Cardiovascular hypertension, + CAD, + Cardiac Stents, +CHF and + Orthopnea  + dysrhythmias (H/o non-sustained VT) (-) Valvular Problems/Murmurs   TTE (09/2019): LVEF 45-50%, mod LVH Elevated mean LA pressure, LA mildly dilated LV diastolic dysfunction Trace MR, trivial TR RA pressure 3 mmHg  Nuclear stress test (08/2019): Negative for ischemia   Neuro/Psych neg Seizures  Neuromuscular disease (Diabetic peripheral neuropathy, Bell's palsy)    GI/Hepatic GERD  ,  Endo/Other  diabetes, Type 2, Oral Hypoglycemic AgentsMorbid obesity  Renal/GU DialysisRenal disease (HD MWF)     Musculoskeletal   Abdominal   Peds  Hematology   Anesthesia Other Findings Gout  Reproductive/Obstetrics                             Anesthesia Physical  Anesthesia Plan  ASA: IV  Anesthesia Plan: MAC   Post-op Pain Management:    Induction: Intravenous  PONV Risk Score and Plan: 1 and TIVA, Midazolam and Treatment may vary due to age or medical condition  Airway Management Planned: Nasal Cannula  Additional Equipment:   Intra-op Plan:   Post-operative Plan:   Informed Consent: I have reviewed the patients History and Physical, chart, labs and discussed the procedure including the risks, benefits and alternatives for the proposed anesthesia with the patient  or authorized representative who has indicated his/her understanding and acceptance.       Plan Discussed with: CRNA and Anesthesiologist  Anesthesia Plan Comments:         Anesthesia Quick Evaluation

## 2019-11-26 NOTE — Discharge Instructions (Signed)

## 2019-11-26 NOTE — Progress Notes (Signed)
Complaint:  Visit Type: Patient returns to my office for continued preventative foot care services. Complaint: Patient states" my nails have grown long and thick and become painful to walk and wear shoes" Patient has been diagnosed with DM with no foot complications. The patient presents for preventative foot care services. No changes to ROS.    Podiatric Exam: Vascular: dorsalis pedis and posterior tibial pulses are weakly palpable bilateral due to swelling. Capillary return is immediate. Temperature gradient is WNL. Skin turgor WNL Venous stasis  B/L. Sensorium: Normal Semmes Weinstein monofilament test. Normal tactile sensation bilaterally. Nail Exam: Pt has thick disfigured discolored nails with subungual debris noted bilateral entire nail hallux through fifth toenails Ulcer Exam: There is no evidence of ulcer or pre-ulcerative changes or infection. Orthopedic Exam: Muscle tone and strength are WNL. No limitations in general ROM. No crepitus or effusions noted. Foot type and digits show no abnormalities. Bony prominences are unremarkable. Skin: No Porokeratosis. No infection or ulcers.  Plantar punctate psoriasis localized to heels  B/L.  Diagnosis:  Onychomycosis, , Pain in right toe, pain in left toes  Treatment & Plan Procedures and Treatment: Consent by patient was obtained for treatment procedures.   Debridement of mycotic and hypertrophic toenails, 1 through 5 bilateral and clearing of subungual debris. No ulceration, no infection noted. Patient was given medicine by his dermatologist. Return Visit-Office Procedure: Patient instructed to return to the office for a follow up visit 10 weeks  for continued evaluation and treatment.    Gardiner Barefoot DPM

## 2019-11-27 ENCOUNTER — Other Ambulatory Visit: Payer: Self-pay | Admitting: Adult Health

## 2019-11-27 DIAGNOSIS — I5032 Chronic diastolic (congestive) heart failure: Secondary | ICD-10-CM

## 2019-11-30 ENCOUNTER — Encounter: Admission: RE | Payer: Self-pay | Source: Home / Self Care

## 2019-11-30 ENCOUNTER — Ambulatory Visit: Admission: RE | Admit: 2019-11-30 | Payer: Medicare Other | Source: Home / Self Care | Admitting: Ophthalmology

## 2019-11-30 HISTORY — DX: Dyspnea, unspecified: R06.00

## 2019-11-30 HISTORY — DX: Unspecified kidney failure: N19

## 2019-11-30 HISTORY — DX: Atherosclerotic heart disease of native coronary artery without angina pectoris: I25.10

## 2019-11-30 HISTORY — DX: Unspecified hearing loss, unspecified ear: H91.90

## 2019-11-30 HISTORY — DX: Chronic obstructive pulmonary disease, unspecified: J44.9

## 2019-11-30 HISTORY — DX: Pneumonia, unspecified organism: J18.9

## 2019-11-30 HISTORY — DX: Orthopnea: R06.01

## 2019-11-30 SURGERY — PHACOEMULSIFICATION, CATARACT, WITH IOL INSERTION
Anesthesia: Topical | Laterality: Left

## 2019-12-01 DIAGNOSIS — N186 End stage renal disease: Secondary | ICD-10-CM | POA: Diagnosis not present

## 2019-12-01 DIAGNOSIS — E1122 Type 2 diabetes mellitus with diabetic chronic kidney disease: Secondary | ICD-10-CM | POA: Diagnosis not present

## 2019-12-01 DIAGNOSIS — I5032 Chronic diastolic (congestive) heart failure: Secondary | ICD-10-CM | POA: Diagnosis not present

## 2019-12-01 DIAGNOSIS — Z95828 Presence of other vascular implants and grafts: Secondary | ICD-10-CM | POA: Diagnosis not present

## 2019-12-01 DIAGNOSIS — Z48812 Encounter for surgical aftercare following surgery on the circulatory system: Secondary | ICD-10-CM | POA: Diagnosis not present

## 2019-12-01 DIAGNOSIS — I132 Hypertensive heart and chronic kidney disease with heart failure and with stage 5 chronic kidney disease, or end stage renal disease: Secondary | ICD-10-CM | POA: Diagnosis not present

## 2019-12-03 DIAGNOSIS — E1122 Type 2 diabetes mellitus with diabetic chronic kidney disease: Secondary | ICD-10-CM | POA: Diagnosis not present

## 2019-12-03 DIAGNOSIS — Z95828 Presence of other vascular implants and grafts: Secondary | ICD-10-CM | POA: Diagnosis not present

## 2019-12-03 DIAGNOSIS — Z48812 Encounter for surgical aftercare following surgery on the circulatory system: Secondary | ICD-10-CM | POA: Diagnosis not present

## 2019-12-03 DIAGNOSIS — I5032 Chronic diastolic (congestive) heart failure: Secondary | ICD-10-CM | POA: Diagnosis not present

## 2019-12-03 DIAGNOSIS — I132 Hypertensive heart and chronic kidney disease with heart failure and with stage 5 chronic kidney disease, or end stage renal disease: Secondary | ICD-10-CM | POA: Diagnosis not present

## 2019-12-03 DIAGNOSIS — N186 End stage renal disease: Secondary | ICD-10-CM | POA: Diagnosis not present

## 2019-12-04 ENCOUNTER — Encounter: Payer: Self-pay | Admitting: Ophthalmology

## 2019-12-04 ENCOUNTER — Telehealth: Payer: Self-pay

## 2019-12-04 NOTE — Telephone Encounter (Signed)
Called lmom informing patient of appointment. klh 

## 2019-12-07 ENCOUNTER — Other Ambulatory Visit: Payer: Self-pay

## 2019-12-07 DIAGNOSIS — E1165 Type 2 diabetes mellitus with hyperglycemia: Secondary | ICD-10-CM

## 2019-12-07 MED ORDER — TRESIBA FLEXTOUCH 100 UNIT/ML ~~LOC~~ SOPN: 16.0000 [IU] | PEN_INJECTOR | Freq: Every day | SUBCUTANEOUS | 3 refills | Status: DC

## 2019-12-08 ENCOUNTER — Ambulatory Visit: Payer: Medicare Other | Admitting: Adult Health

## 2019-12-08 ENCOUNTER — Telehealth: Payer: Self-pay

## 2019-12-08 DIAGNOSIS — Z48812 Encounter for surgical aftercare following surgery on the circulatory system: Secondary | ICD-10-CM | POA: Diagnosis not present

## 2019-12-08 DIAGNOSIS — I132 Hypertensive heart and chronic kidney disease with heart failure and with stage 5 chronic kidney disease, or end stage renal disease: Secondary | ICD-10-CM | POA: Diagnosis not present

## 2019-12-08 DIAGNOSIS — I5032 Chronic diastolic (congestive) heart failure: Secondary | ICD-10-CM | POA: Diagnosis not present

## 2019-12-08 DIAGNOSIS — N186 End stage renal disease: Secondary | ICD-10-CM | POA: Diagnosis not present

## 2019-12-08 DIAGNOSIS — E1122 Type 2 diabetes mellitus with diabetic chronic kidney disease: Secondary | ICD-10-CM | POA: Diagnosis not present

## 2019-12-08 DIAGNOSIS — Z95828 Presence of other vascular implants and grafts: Secondary | ICD-10-CM | POA: Diagnosis not present

## 2019-12-08 NOTE — Telephone Encounter (Signed)
LMOM FOR PATIENT TO RS MISSED APPT FROM 12-08-19 AND APPT IS NEEDED IN ORDER TO COMPLETE FL2 FORM FOR [place].

## 2019-12-15 ENCOUNTER — Other Ambulatory Visit
Admission: RE | Admit: 2019-12-15 | Discharge: 2019-12-15 | Disposition: A | Discharge status: Home / Self Care | Payer: Medicare Other | Source: Ambulatory Visit | Attending: Ophthalmology | Admitting: Ophthalmology

## 2019-12-15 ENCOUNTER — Other Ambulatory Visit: Payer: Self-pay

## 2019-12-15 DIAGNOSIS — Z20828 Contact with and (suspected) exposure to other viral communicable diseases: Secondary | ICD-10-CM | POA: Insufficient documentation

## 2019-12-15 DIAGNOSIS — Z01812 Encounter for preprocedural laboratory examination: Secondary | ICD-10-CM | POA: Insufficient documentation

## 2019-12-15 LAB — SARS CORONAVIRUS 2 (TAT 6-24 HRS): SARS Coronavirus 2: NEGATIVE

## 2019-12-17 ENCOUNTER — Encounter: Payer: Self-pay | Admitting: Ophthalmology

## 2019-12-17 ENCOUNTER — Ambulatory Visit: Payer: Medicare Other | Admitting: Anesthesiology

## 2019-12-17 ENCOUNTER — Ambulatory Visit
Admission: RE | Admit: 2019-12-17 | Discharge: 2019-12-17 | Disposition: A | Discharge status: Home / Self Care | Payer: Medicare Other | Attending: Ophthalmology | Admitting: Ophthalmology

## 2019-12-17 ENCOUNTER — Encounter
Admission: RE | Disposition: A | Discharge status: Home / Self Care | Payer: Self-pay | Source: Home / Self Care | Attending: Ophthalmology

## 2019-12-17 ENCOUNTER — Other Ambulatory Visit: Payer: Self-pay

## 2019-12-17 DIAGNOSIS — I2510 Atherosclerotic heart disease of native coronary artery without angina pectoris: Secondary | ICD-10-CM | POA: Insufficient documentation

## 2019-12-17 DIAGNOSIS — Z9981 Dependence on supplemental oxygen: Secondary | ICD-10-CM | POA: Insufficient documentation

## 2019-12-17 DIAGNOSIS — Z7951 Long term (current) use of inhaled steroids: Secondary | ICD-10-CM | POA: Diagnosis not present

## 2019-12-17 DIAGNOSIS — Z7982 Long term (current) use of aspirin: Secondary | ICD-10-CM | POA: Insufficient documentation

## 2019-12-17 DIAGNOSIS — E1122 Type 2 diabetes mellitus with diabetic chronic kidney disease: Secondary | ICD-10-CM | POA: Insufficient documentation

## 2019-12-17 DIAGNOSIS — I272 Pulmonary hypertension, unspecified: Secondary | ICD-10-CM | POA: Insufficient documentation

## 2019-12-17 DIAGNOSIS — E1136 Type 2 diabetes mellitus with diabetic cataract: Secondary | ICD-10-CM | POA: Insufficient documentation

## 2019-12-17 DIAGNOSIS — N189 Chronic kidney disease, unspecified: Secondary | ICD-10-CM | POA: Diagnosis not present

## 2019-12-17 DIAGNOSIS — J449 Chronic obstructive pulmonary disease, unspecified: Secondary | ICD-10-CM | POA: Diagnosis not present

## 2019-12-17 DIAGNOSIS — H2512 Age-related nuclear cataract, left eye: Secondary | ICD-10-CM | POA: Insufficient documentation

## 2019-12-17 DIAGNOSIS — M109 Gout, unspecified: Secondary | ICD-10-CM | POA: Diagnosis not present

## 2019-12-17 DIAGNOSIS — Z955 Presence of coronary angioplasty implant and graft: Secondary | ICD-10-CM | POA: Insufficient documentation

## 2019-12-17 DIAGNOSIS — H401121 Primary open-angle glaucoma, left eye, mild stage: Secondary | ICD-10-CM | POA: Diagnosis not present

## 2019-12-17 DIAGNOSIS — I509 Heart failure, unspecified: Secondary | ICD-10-CM | POA: Diagnosis not present

## 2019-12-17 DIAGNOSIS — I130 Hypertensive heart and chronic kidney disease with heart failure and stage 1 through stage 4 chronic kidney disease, or unspecified chronic kidney disease: Secondary | ICD-10-CM | POA: Insufficient documentation

## 2019-12-17 DIAGNOSIS — Z992 Dependence on renal dialysis: Secondary | ICD-10-CM | POA: Insufficient documentation

## 2019-12-17 DIAGNOSIS — Z6841 Body Mass Index (BMI) 40.0 and over, adult: Secondary | ICD-10-CM | POA: Insufficient documentation

## 2019-12-17 DIAGNOSIS — L409 Psoriasis, unspecified: Secondary | ICD-10-CM | POA: Insufficient documentation

## 2019-12-17 DIAGNOSIS — H9190 Unspecified hearing loss, unspecified ear: Secondary | ICD-10-CM | POA: Insufficient documentation

## 2019-12-17 DIAGNOSIS — G4733 Obstructive sleep apnea (adult) (pediatric): Secondary | ICD-10-CM | POA: Diagnosis not present

## 2019-12-17 DIAGNOSIS — K219 Gastro-esophageal reflux disease without esophagitis: Secondary | ICD-10-CM | POA: Insufficient documentation

## 2019-12-17 DIAGNOSIS — Z794 Long term (current) use of insulin: Secondary | ICD-10-CM | POA: Diagnosis not present

## 2019-12-17 DIAGNOSIS — E7800 Pure hypercholesterolemia, unspecified: Secondary | ICD-10-CM | POA: Diagnosis not present

## 2019-12-17 DIAGNOSIS — Z79899 Other long term (current) drug therapy: Secondary | ICD-10-CM | POA: Diagnosis not present

## 2019-12-17 HISTORY — PX: CATARACT EXTRACTION W/PHACO: SHX586

## 2019-12-17 HISTORY — DX: Pure hypercholesterolemia, unspecified: E78.00

## 2019-12-17 HISTORY — PX: INSERTION OF AHMED VALVE: SHX6254

## 2019-12-17 HISTORY — DX: Dependence on supplemental oxygen: Z99.81

## 2019-12-17 HISTORY — DX: Unspecified cataract: H26.9

## 2019-12-17 LAB — POCT I-STAT, CHEM 8
BUN: 39 mg/dL — ABNORMAL HIGH (ref 6–20)
Calcium, Ion: 1.05 mmol/L — ABNORMAL LOW (ref 1.15–1.40)
Chloride: 107 mmol/L (ref 98–111)
Creatinine, Ser: 3.2 mg/dL — ABNORMAL HIGH (ref 0.61–1.24)
Glucose, Bld: 196 mg/dL — ABNORMAL HIGH (ref 70–99)
HCT: 36 % — ABNORMAL LOW (ref 39.0–52.0)
Hemoglobin: 12.2 g/dL — ABNORMAL LOW (ref 13.0–17.0)
Potassium: 4.6 mmol/L (ref 3.5–5.1)
Sodium: 141 mmol/L (ref 135–145)
TCO2: 28 mmol/L (ref 22–32)

## 2019-12-17 SURGERY — PHACOEMULSIFICATION, CATARACT, WITH IOL INSERTION
Anesthesia: Monitor Anesthesia Care | Site: Eye | Laterality: Left

## 2019-12-17 MED ORDER — SODIUM CHLORIDE 0.9 % IV SOLN: INTRAVENOUS | Status: DC

## 2019-12-17 MED ORDER — MOXIFLOXACIN HCL 0.5 % OP SOLN
OPHTHALMIC | Status: DC | PRN
  Administered 2019-12-17: 0.2 mL via OPHTHALMIC

## 2019-12-17 MED ORDER — BUPIVACAINE HCL (PF) 0.75 % IJ SOLN
INTRAMUSCULAR | Status: AC
  Filled 2019-12-17: qty 10

## 2019-12-17 MED ORDER — MOXIFLOXACIN HCL 0.5 % OP SOLN - NO CHARGE
1.0000 [drp] | Freq: Once | OPHTHALMIC | Status: DC
  Filled 2019-12-17: qty 3

## 2019-12-17 MED ORDER — TETRACAINE HCL 0.5 % OP SOLN
1.0000 [drp] | OPHTHALMIC | Status: AC | PRN
  Administered 2019-12-17: 1 [drp] via OPHTHALMIC

## 2019-12-17 MED ORDER — MIDAZOLAM HCL 2 MG/2ML IJ SOLN
INTRAMUSCULAR | Status: AC
  Filled 2019-12-17: qty 2

## 2019-12-17 MED ORDER — LIDOCAINE HCL (PF) 4 % IJ SOLN
INTRAOCULAR | Status: DC | PRN
  Administered 2019-12-17: 4 mL via OPHTHALMIC

## 2019-12-17 MED ORDER — MIDAZOLAM HCL 2 MG/2ML IJ SOLN
INTRAMUSCULAR | Status: DC | PRN
  Administered 2019-12-17 (×2): .5 mg via INTRAVENOUS

## 2019-12-17 MED ORDER — POVIDONE-IODINE 5 % OP SOLN
OPHTHALMIC | Status: DC | PRN
  Administered 2019-12-17: 1 via OPHTHALMIC

## 2019-12-17 MED ORDER — FENTANYL CITRATE (PF) 100 MCG/2ML IJ SOLN
INTRAMUSCULAR | Status: AC
  Filled 2019-12-17: qty 2

## 2019-12-17 MED ORDER — ONDANSETRON HCL 4 MG/2ML IJ SOLN
INTRAMUSCULAR | Status: AC
  Filled 2019-12-17: qty 2

## 2019-12-17 MED ORDER — ONDANSETRON HCL 4 MG/2ML IJ SOLN
INTRAMUSCULAR | Status: DC | PRN
  Administered 2019-12-17: 4 mg via INTRAVENOUS

## 2019-12-17 MED ORDER — FENTANYL CITRATE (PF) 100 MCG/2ML IJ SOLN
INTRAMUSCULAR | Status: DC | PRN
  Administered 2019-12-17 (×3): 25 ug via INTRAVENOUS

## 2019-12-17 MED ORDER — MOXIFLOXACIN HCL 0.5 % OP SOLN
OPHTHALMIC | Status: AC
  Filled 2019-12-17: qty 3

## 2019-12-17 MED ORDER — PHENYLEPHRINE HCL (PRESSORS) 10 MG/ML IV SOLN
INTRAVENOUS | Status: AC
  Filled 2019-12-17: qty 1

## 2019-12-17 MED ORDER — MOXIFLOXACIN HCL 0.5 % OP SOLN: 1.0000 [drp] | Freq: Once | OPHTHALMIC | Status: DC

## 2019-12-17 MED ORDER — EPINEPHRINE PF 1 MG/ML IJ SOLN: INTRAOCULAR | Status: DC | PRN

## 2019-12-17 MED ORDER — SODIUM HYALURONATE 23 MG/ML IO SOLN
INTRAOCULAR | Status: DC | PRN
  Administered 2019-12-17: 0.6 mL via INTRAOCULAR

## 2019-12-17 MED ORDER — DEXMEDETOMIDINE HCL 200 MCG/2ML IV SOLN
INTRAVENOUS | Status: DC | PRN
  Administered 2019-12-17 (×3): 4 ug via INTRAVENOUS
  Administered 2019-12-17: 8 ug via INTRAVENOUS

## 2019-12-17 MED ORDER — TETRACAINE HCL 0.5 % OP SOLN
OPHTHALMIC | Status: AC
  Administered 2019-12-17: 1 [drp] via OPHTHALMIC
  Filled 2019-12-17: qty 4

## 2019-12-17 MED ORDER — PROPOFOL 10 MG/ML IV BOLUS
INTRAVENOUS | Status: AC
  Filled 2019-12-17: qty 20

## 2019-12-17 MED ORDER — GLYCOPYRROLATE PF 0.2 MG/ML IJ SOSY
PREFILLED_SYRINGE | INTRAMUSCULAR | Status: DC | PRN
  Administered 2019-12-17: .1 mg via INTRAVENOUS

## 2019-12-17 MED ORDER — ARMC OPHTHALMIC DILATING DROPS
1.0000 "application " | OPHTHALMIC | Status: AC
  Administered 2019-12-17 (×2): 1 via OPHTHALMIC

## 2019-12-17 MED ORDER — LIDOCAINE HCL (PF) 4 % IJ SOLN
INTRAMUSCULAR | Status: AC
  Filled 2019-12-17: qty 5

## 2019-12-17 MED ORDER — ARMC OPHTHALMIC DILATING DROPS
OPHTHALMIC | Status: AC
  Administered 2019-12-17: 1 via OPHTHALMIC
  Filled 2019-12-17: qty 0.5

## 2019-12-17 MED ORDER — SODIUM HYALURONATE 10 MG/ML IO SOLN
INTRAOCULAR | Status: DC | PRN
  Administered 2019-12-17: 0.55 mL via INTRAOCULAR

## 2019-12-17 MED ORDER — HYALURONIDASE HUMAN 150 UNIT/ML IJ SOLN
INTRAMUSCULAR | Status: AC
  Filled 2019-12-17: qty 1

## 2019-12-17 SURGICAL SUPPLY — 41 items
BLADE SURG MINI STRL (BLADE) ×3 IMPLANT
BNDG EYE OVAL (GAUZE/BANDAGES/DRESSINGS) ×3 IMPLANT
CANNULA ANT/CHMB 27GA (MISCELLANEOUS) ×3 IMPLANT
CORNEAL LIGHT SHIELD (MISCELLANEOUS) ×3
CUP MEDICINE 2OZ PLAST GRAD ST (MISCELLANEOUS) ×6 IMPLANT
DEVICE INJECT ISTENT W (Stent) ×2 IMPLANT
DISSECTOR HYDRO NUCLEUS 50X22 (MISCELLANEOUS) ×3 IMPLANT
ERASER HMR WETFIELD 18G (MISCELLANEOUS) ×3 IMPLANT
GLOVE BIO SURGEON STRL SZ8 (GLOVE) ×3 IMPLANT
GLOVE BIOGEL M 6.5 STRL (GLOVE) ×3 IMPLANT
GLOVE SURG LX 6.5 MICRO (GLOVE) ×1
GLOVE SURG LX 7.5 STRW (GLOVE) ×1
GLOVE SURG LX STRL 6.5 MICRO (GLOVE) ×2 IMPLANT
GLOVE SURG LX STRL 7.5 STRW (GLOVE) ×2 IMPLANT
GOWN STRL REUS W/ TWL LRG LVL3 (GOWN DISPOSABLE) ×4 IMPLANT
GOWN STRL REUS W/TWL LRG LVL3 (GOWN DISPOSABLE) ×2
INJECT ISTENT W (Stent) ×3 IMPLANT
KNIFE SIDECUT EYE (MISCELLANEOUS) ×3 IMPLANT
LABEL CATARACT MEDS ST (LABEL) ×3 IMPLANT
LENS IOL TECNIS ITEC 19.5 (Intraocular Lens) ×3 IMPLANT
MARKER SKIN DUAL TIP RULER LAB (MISCELLANEOUS) ×3 IMPLANT
NEEDLE HYPO 22GX1.5 SAFETY (NEEDLE) ×3 IMPLANT
NEEDLE HYPO 27GX1-1/4 (NEEDLE) ×3 IMPLANT
NEEDLE HYPO 30GX1 BEV (NEEDLE) ×3 IMPLANT
PACK CATARACT (MISCELLANEOUS) ×3 IMPLANT
PACK CATARACT KING (MISCELLANEOUS) ×3 IMPLANT
PACK EYE AFTER SURG (MISCELLANEOUS) ×3 IMPLANT
SHIELD LIGHT CORNEAL (MISCELLANEOUS) ×2 IMPLANT
SOL BAL SALT 15ML (MISCELLANEOUS) ×3
SOL BSS BAG (MISCELLANEOUS) ×3
SOLUTION BAL SALT 15ML (MISCELLANEOUS) ×2 IMPLANT
SOLUTION BSS BAG (MISCELLANEOUS) ×2 IMPLANT
SUT ETHILON 10 0 CS140 6 (SUTURE) ×3 IMPLANT
SUT ETHILON 9-0 (SUTURE) ×3 IMPLANT
SUT SILK 5-0 (SUTURE) ×3 IMPLANT
SUT VICRYL  9 0 (SUTURE) ×1
SUT VICRYL 9 0 (SUTURE) ×2 IMPLANT
SYR 3ML LL SCALE MARK (SYRINGE) ×3 IMPLANT
SYR TB 1ML 27GX1/2 LL (SYRINGE) ×3 IMPLANT
WATER STERILE IRR 250ML POUR (IV SOLUTION) ×3 IMPLANT
WIPE NON LINTING 3.25X3.25 (MISCELLANEOUS) ×3 IMPLANT

## 2019-12-17 NOTE — Anesthesia Post-op Follow-up Note (Signed)
Anesthesia QCDR form completed.        

## 2019-12-17 NOTE — H&P (Signed)

## 2019-12-17 NOTE — Anesthesia Postprocedure Evaluation (Signed)
Anesthesia Post Note  Patient: [name]  Procedure(s) Performed: CATARACT EXTRACTION PHACO AND INTRAOCULAR LENS PLACEMENT (IOC) LEFT ISTENT INJ DIABETIC (Left Eye) INSERTION OF iSTENT (Left )  Patient location during evaluation: Phase II Anesthesia Type: MAC Level of consciousness: awake and alert Pain management: pain level controlled Vital Signs Assessment: post-procedure vital signs reviewed and stable Respiratory status: spontaneous breathing, nonlabored ventilation and respiratory function stable Cardiovascular status: blood pressure returned to baseline and stable Postop Assessment: no apparent nausea or vomiting Anesthetic complications: no     Last Vitals:  Vitals:   12/17/19 0833 12/17/19 0900  BP: (!) 152/63 (!) 145/60  Pulse: 81 80  Resp: 17 18  Temp: 36.4 C (!) 36.4 C  SpO2:      Last Pain:  Vitals:   12/17/19 0900  TempSrc:   PainSc: 0-No pain                 Alphonsus Sias

## 2019-12-17 NOTE — Op Note (Signed)
OPERATIVE NOTE  [name] [id number] 12/17/2019  PREOPERATIVE DIAGNOSIS:   1.  Mild  PRIMARY open angle glaucoma, left eye. W09.8119 \ 2.  Nuclear sclerotic cataract left eye.  H25.12   POSTOPERATIVE DIAGNOSIS:    same.   PROCEDURE:   1.  Placement of trabecular bypass stent (istent). CPT 0191T  and placement of additional stent  CPT 0376T 2.  Phacoemusification with posterior chamber intraocular lens placement of the right eye  CPT [phone]   LENS: Implant Name Type Inv. Item Serial No. Manufacturer Lot No. LRB No. Used Action  LENS IOL DIOP 19.5 - [id number] Intraocular Lens LENS IOL DIOP 19.5 [id number] AMO  Left 1 Implanted  [name] - [id number] Stent [name] [id number] [employer] [id number] Left 1 Implanted      Procedure(s) with comments: CATARACT EXTRACTION PHACO AND INTRAOCULAR LENS PLACEMENT (IOC) LEFT ISTENT INJ DIABETIC (Left) - [place] 00:33.2 CDE 2.96 Fluid Pack Lot # [id number] H INSERTION OF iSTENT (Left)    SURGEON:  Benay Pillow, MD, MPH  ANESTHESIOLOGIST: Anesthesiologist: Alphonsus Sias, MD CRNA: Lia Foyer, CRNA   ANESTHESIA:  MAC and intracameral preservative-free intracameral lidocaine 4%.  ESTIMATED BLOOD LOSS: less than 1 mL.   COMPLICATIONS:  None.   DESCRIPTION OF PROCEDURE:  The patient was identified in the holding room and transported to the operating room.   The patient was placed in the supine position under the operating microscope.  The left eye was prepped and draped in the usual sterile ophthalmic fashion.   A 1.0 millimeter clear-corneal paracentesis was made at the 4:30 position. 0.5 ml of preservative-free 1% lidocaine with epinephrine was injected into the anterior chamber.  The anterior chamber was filled with Healon 5 viscoelastic.  A 2.4 millimeter keratome was used to make a near-clear corneal incision at the 2:00 position.   Attention was turned to the istent.  The patients head was turned to  the left and the microscope was tilted to 035 degrees.  Ocular instruments/Glaukos OAL/H2 gonioprism was used with IPC05 (iclip) coupled with Healon 5 on the cornea was used to visualize the trabecular meshwork. The istent was opened and introduced into the eye.  The meshwork was engaged with the tip of the iStent injector and the stent was deployed into Schlemm's canal at 10:30.  The second stent was deployed at 8:00.  The stents were well seated and in good position.  Next, attention was turned to the phacoemulsification A curvilinear capsulorrhexis was made with a cystotome and capsulorrhexis forceps.  Balanced salt solution was used to hydrodissect and hydrodelineate the nucleus.   Phacoemulsification was then used in stop and chop fashion to remove the lens nucleus and epinucleus.  The remaining cortex was then removed using the irrigation and aspiration handpiece. Healon was then placed into the capsular bag to distend it for lens placement.  A lens was then injected into the capsular bag.  The remaining viscoelastic was aspirated.   Wounds were hydrated with balanced salt solution.  The anterior chamber was inflated to a physiologic pressure with balanced salt solution.   Intracameral vigamox 0.1 mL undiluted was injected into the eye and a drop placed onto the ocular surface.  No wound leaks were noted. The patient was taken to the recovery room in stable condition without complications of anesthesia or surgery   Benay Pillow 12/17/2019, 8:13 AM

## 2019-12-17 NOTE — Anesthesia Preprocedure Evaluation (Addendum)
Anesthesia Evaluation  Patient identified by MRN, date of birth, ID band Patient awake    Reviewed: Allergy & Precautions, H&P , NPO status , reviewed documented beta blocker date and time   Airway Mallampati: III       Dental  (+) Chipped, Missing, Caps   Pulmonary shortness of breath, sleep apnea, Continuous Positive Airway Pressure Ventilation and Oxygen sleep apnea , pneumonia, COPD,     + decreased breath sounds      Cardiovascular hypertension, + CAD, +CHF and + Orthopnea  Normal cardiovascular exam     Neuro/Psych  Neuromuscular disease    GI/Hepatic GERD  Controlled,  Endo/Other  diabetesMorbid obesity  Renal/GU DialysisRenal diseaseDialysis yesterady     Musculoskeletal  (+) Arthritis ,   Abdominal   Peds  Hematology   Anesthesia Other Findings Past Medical History: No date: Bell's palsy No date: Bell's palsy No date: Cataract No date: CHF (congestive heart failure) (HCC) No date: Chronic kidney disease No date: COPD (chronic obstructive pulmonary disease) (HCC) No date: Coronary artery disease No date: Diabetes mellitus without complication (HCC) No date: Dyspnea     Comment:  Wheezing No date: GERD (gastroesophageal reflux disease) No date: Gout No date: HOH (hard of hearing)     Comment:  mild No date: Hypercholesterolemia No date: Hypertension No date: NSVT (nonsustained ventricular tachycardia) (HCC) No date: Obstructive sleep apnea     Comment:  CPAP, O2 use continuosly at 3LPM No date: Orthopnea No date: Oxygen dependent     Comment:  3 lpm continuous No date: Pneumonia     Comment:  In Past X2 most recent 1 yr ago No date: Psoriasis No date: Psoriasis No date: Pulmonary HTN (HCC) No date: Renal failure  Past Surgical History: 11/20/2019: AV FISTULA PLACEMENT; Right     Comment:  Procedure: ARTERIOVENOUS (AV) FISTULA CREATION (               BRACHIAL CEPHALIC );  Surgeon: Katha Cabal, MD;                Location: [facility];  Service: Vascular;  Laterality:               Right; No date: CORONARY ANGIOPLASTY WITH STENT PLACEMENT     Comment:  stent placement 06/22/2019: DIALYSIS/PERMA CATHETER INSERTION; N/A     Comment:  Procedure: DIALYSIS/PERMA CATHETER INSERTION;  Surgeon:               Algernon Huxley, MD;  Location: [place] [facility];                Service: Cardiovascular;  Laterality: N/A;  BMI    Body Mass Index: 46.94 kg/m      Reproductive/Obstetrics                           Anesthesia Physical Anesthesia Plan  ASA: IV  Anesthesia Plan: MAC   Post-op Pain Management:    Induction: Intravenous  PONV Risk Score and Plan: 1 and TIVA and Midazolam  Airway Management Planned: Nasal Cannula and Natural Airway  Additional Equipment:   Intra-op Plan:   Post-operative Plan:   Informed Consent: I have reviewed the patients History and Physical, chart, labs and discussed the procedure including the risks, benefits and alternatives for the proposed anesthesia with the patient or authorized representative who has indicated his/her understanding and acceptance.     Dental Advisory Given  Plan Discussed with: CRNA  Anesthesia Plan Comments:         Anesthesia Quick Evaluation

## 2019-12-17 NOTE — Transfer of Care (Signed)
Immediate Anesthesia Transfer of Care Note  Patient: [name]  Procedure(s) Performed: CATARACT EXTRACTION PHACO AND INTRAOCULAR LENS PLACEMENT (IOC) LEFT ISTENT INJ DIABETIC (Left Eye) INSERTION OF iSTENT (Left )  Patient Location: PACU  Anesthesia Type:MAC  Level of Consciousness: awake, drowsy and patient cooperative  Airway & Oxygen Therapy: Patient Spontanous Breathing and Patient connected to nasal cannula oxygen  Post-op Assessment: Report given to RN and Post -op Vital signs reviewed and stable  Post vital signs: Reviewed and stable  Last Vitals:  Vitals Value Taken Time  BP    Temp    Pulse    Resp    SpO2      Last Pain:  Vitals:   12/17/19 0613  TempSrc: Temporal  PainSc: 0-No pain         Complications: No apparent anesthesia complications

## 2019-12-19 DIAGNOSIS — N2581 Secondary hyperparathyroidism of renal origin: Secondary | ICD-10-CM | POA: Diagnosis not present

## 2019-12-19 DIAGNOSIS — D631 Anemia in chronic kidney disease: Secondary | ICD-10-CM | POA: Diagnosis not present

## 2019-12-19 DIAGNOSIS — N186 End stage renal disease: Secondary | ICD-10-CM | POA: Diagnosis not present

## 2019-12-19 DIAGNOSIS — Z992 Dependence on renal dialysis: Secondary | ICD-10-CM | POA: Diagnosis not present

## 2019-12-21 ENCOUNTER — Ambulatory Visit (INDEPENDENT_AMBULATORY_CARE_PROVIDER_SITE_OTHER): Payer: Medicare Other | Admitting: Vascular Surgery

## 2019-12-21 ENCOUNTER — Encounter (INDEPENDENT_AMBULATORY_CARE_PROVIDER_SITE_OTHER): Payer: Self-pay | Admitting: Vascular Surgery

## 2019-12-21 ENCOUNTER — Other Ambulatory Visit: Payer: Self-pay

## 2019-12-21 VITALS — BP 152/100 | HR 90 | Resp 16 | Wt 271.0 lb

## 2019-12-21 DIAGNOSIS — D631 Anemia in chronic kidney disease: Secondary | ICD-10-CM | POA: Diagnosis not present

## 2019-12-21 DIAGNOSIS — N186 End stage renal disease: Secondary | ICD-10-CM | POA: Diagnosis not present

## 2019-12-21 DIAGNOSIS — Z992 Dependence on renal dialysis: Secondary | ICD-10-CM | POA: Diagnosis not present

## 2019-12-21 DIAGNOSIS — N2581 Secondary hyperparathyroidism of renal origin: Secondary | ICD-10-CM | POA: Diagnosis not present

## 2019-12-21 NOTE — Progress Notes (Signed)
Patient ID: [name], male   DOB: September 30, 1966, 54 y.o.   MRN: [id number]  No chief complaint on file.   HPI [name] is a 54 y.o. male.    Creation of a right brachiocephalic fistula on 07/23/5783.  Patient notes that his postoperative pain although difficult to control initially is now essentially gone.  He does note some increased swelling in the surgical site.   Past Medical History:  Diagnosis Date  . Bell's palsy   . Bell's palsy   . Cataract   . CHF (congestive heart failure) ([place])   . Chronic kidney disease   . COPD (chronic obstructive pulmonary disease) ([place])   . Coronary artery disease   . Diabetes mellitus without complication ([place])   . Dyspnea    Wheezing  . GERD (gastroesophageal reflux disease)   . Gout   . HOH (hard of hearing)    mild  . Hypercholesterolemia   . Hypertension   . NSVT (nonsustained ventricular tachycardia) ([place])   . Obstructive sleep apnea    CPAP, O2 use continuosly at 3LPM  . Orthopnea   . Oxygen dependent    3 lpm continuous  . Pneumonia    In Past X2 most recent 1 yr ago  . Psoriasis   . Psoriasis   . Pulmonary HTN ([place])   . Renal failure     Past Surgical History:  Procedure Laterality Date  . AV FISTULA PLACEMENT Right 11/20/2019   Procedure: ARTERIOVENOUS (AV) FISTULA CREATION ( BRACHIAL CEPHALIC );  Surgeon: Katha Cabal, MD;  Location: [facility];  Service: Vascular;  Laterality: Right;  . CATARACT EXTRACTION W/PHACO Left 12/17/2019   Procedure: CATARACT EXTRACTION PHACO AND INTRAOCULAR LENS PLACEMENT ([place]) LEFT ISTENT INJ DIABETIC;  Surgeon: Eulogio Bear, MD;  Location: [facility];  Service: Ophthalmology;  Laterality: Left;  [place] 00:33.2 CDE 2.96 Fluid Pack Lot # [id number]  . CORONARY ANGIOPLASTY WITH STENT PLACEMENT     stent placement  . DIALYSIS/PERMA CATHETER INSERTION N/A 06/22/2019   Procedure: DIALYSIS/PERMA CATHETER INSERTION;  Surgeon: Algernon Huxley, MD;  Location: [place] [facility];   Service: Cardiovascular;  Laterality: N/A;  . INSERTION OF AHMED VALVE Left 12/17/2019   Procedure: INSERTION OF iSTENT;  Surgeon: Eulogio Bear, MD;  Location: [facility];  Service: Ophthalmology;  Laterality: Left;      Allergies  Allergen Reactions  . Shellfish Allergy Anaphylaxis    Face and throat swelling, difficulty breathing Allergy can be triggered by touching (contact)  . Betadine [Povidone Iodine] Rash    Current Outpatient Medications  Medication Sig Dispense Refill  . albuterol (PROVENTIL HFA;VENTOLIN HFA) 108 (90 Base) MCG/ACT inhaler Inhale 2 puffs into the lungs every 4 (four) hours as needed for wheezing or shortness of breath. 6.7 g 5  . allopurinol (ZYLOPRIM) 300 MG tablet Take 1 tablet (300 mg total) by mouth daily. 90 tablet 4  . amLODipine (NORVASC) 5 MG tablet Take 5 mg by mouth daily.    [name] ELLIPTA 62.5-25 MCG/INH AEPB INHALE 1 PUFF INTO THE LUNGS DAILY. (Patient taking differently: Inhale 1 puff into the lungs daily. ) 60 each 11  . aspirin EC 81 MG tablet Take 1 tablet (81 mg total) by mouth daily. 90 tablet 1  . atorvastatin (LIPITOR) 40 MG tablet Take 1 tablet (40 mg total) by mouth at bedtime. 90 tablet 3  . carvedilol (COREG) 25 MG tablet Take 1 tablet (25 mg total) by mouth every  12 (twelve) hours. 180 tablet 3  . Cholecalciferol (D3-1000) 25 MCG (1000 UT) tablet Take 2 tablets (2,000 Units total) by mouth daily. (Patient taking differently: Take 2,000 Units by mouth 2 (two) times daily. ) 60 tablet 3  . clobetasol ointment (TEMOVATE) 3.41 % Apply 1 application topically 2 (two) times daily.    . Dulaglutide (TRULICITY) 1.5 DQ/2.2WL SOPN Use as directed once a week diag e11.65 (Patient taking differently: Inject 1.5 mg into the skin every 7 (seven) days. Monday) 4 pen 5  . DUREZOL 0.05 % EMUL Place 1 drop into the left eye 4 (four) times daily.    . furosemide (LASIX) 40 MG tablet TAKE 1 & 1/2 TABLETS BY MOUTH TWICE DAILY (Patient taking differently:  Take 40 mg by mouth 2 (two) times daily. ) 84 tablet 2  . gabapentin (NEURONTIN) 300 MG capsule TAKE (1) CAPSULE BY MOUTH TWICE DAILY. (Patient taking differently: Take 300 mg by mouth 2 (two) times daily. ) 56 capsule 1  . GAVILAX 17 GM/SCOOP powder Take 1 Container by mouth daily as needed for mild constipation or moderate constipation.     [name] Kitchen glimepiride (AMARYL) 4 MG tablet Take 4 mg by mouth daily.    . hydrALAZINE (APRESOLINE) 25 MG tablet TAKE 1 TABLET BY MOUTH TWICE DAILY FOR HIGH BLOOD PRESSURE (Patient taking differently: Take 25 mg by mouth daily. ) 56 tablet 11  . hydrocortisone cream 1 % Apply 1 application topically 2 (two) times daily.    . insulin degludec (TRESIBA FLEXTOUCH) 100 UNIT/ML SOPN FlexTouch Pen Inject 0.16 mLs (16 Units total) into the skin at bedtime. 9 pen 3  . isosorbide mononitrate (IMDUR) 60 MG 24 hr tablet TAKE 1 TABLET BY MOUTH ONCE DAILY. (Patient taking differently: Take 60 mg by mouth daily. ) 30 tablet 2  . latanoprost (XALATAN) 0.005 % ophthalmic solution Place 1 drop into both eyes at bedtime.     . Lido-Capsaicin-Men-Methyl Sal (1ST MEDX-PATCH/ LIDOCAINE) 4-0.025-5-20 % PTCH Apply 1 patch topically daily. (Patient not taking: Reported on 12/02/2019) 10 patch 2  . magnesium oxide (MAG-OX) 400 MG tablet Take 400 mg by mouth daily.    . Multiple Vitamins-Minerals (TAB-A-VITE) TABS Take 1 tablet by mouth daily. 90 tablet 0  . multivitamin (RENA-VIT) TABS tablet Take 1 tablet by mouth at bedtime. (Patient not taking: Reported on 12/02/2019) 30 tablet 0  . Nutritional Supplements (FEEDING SUPPLEMENT, NEPRO CARB STEADY,) LIQD Take 237 mLs by mouth 2 (two) times daily between meals. (Patient not taking: Reported on 12/02/2019) 60 mL 0  . omeprazole (PRILOSEC) 20 MG capsule Take 1 capsule (20 mg total) by mouth 2 (two) times daily before a meal. (Patient taking differently: Take 20 mg by mouth 2 (two) times daily. ) 180 capsule 2  . oxyCODONE-acetaminophen (PERCOCET)  5-325 MG tablet Take 1-2 tablets by mouth every 6 (six) hours as needed for moderate pain or severe pain. (Patient not taking: Reported on 12/04/2019) 40 tablet 0  . SKYRIZI, 150 MG DOSE, 75 MG/0.83ML PSKT Inject 300 mg into the skin See admin instructions. Inject 300 mg into the skin every 4 months    . traZODone (DESYREL) 100 MG tablet Take one or one and half tab at night for sleep (Patient taking differently: Take 150 mg by mouth at bedtime. ) 45 tablet 6  . Triamcinolone Acetonide (TRIAMCINOLONE 0.1 % CREAM : EUCERIN) CREA Apply 1 application topically 2 (two) times daily. 1 each 5  . triamcinolone ointment (KENALOG) 0.5 % Apply  1 application topically 2 (two) times daily. 30 g 11   No current facility-administered medications for this visit.        Physical Exam There were no vitals taken for this visit. Gen:  WD/WN, NAD Skin: incision C/D/I, skin looks quite good and there is no evidence for infection.  There is some fullness to his proximal forearm and around the incision that I believe is within the normal limits of postoperative changes.  Excellent thrill and bruit.  Catheter is clean dry and intact and nontender to palpation     Assessment/Plan: 1. End stage renal disease ([place]) Recommend:  The patient is doing well and currently has adequate dialysis access. The patient's dialysis center is not reporting any access issues. Flow pattern is stable when compared to the prior ultrasound.  The patient should have a duplex ultrasound of the dialysis access in 6 weeks.  The patient will follow-up with me in the office after each ultrasound    - DIALYSIS ACCESS (AVF; Future    [name] 12/21/2019, 8:42 AM   This note was created with Dragon medical transcription system.  Any errors from dictation are unintentional.

## 2019-12-23 DIAGNOSIS — N2581 Secondary hyperparathyroidism of renal origin: Secondary | ICD-10-CM | POA: Diagnosis not present

## 2019-12-23 DIAGNOSIS — D631 Anemia in chronic kidney disease: Secondary | ICD-10-CM | POA: Diagnosis not present

## 2019-12-23 DIAGNOSIS — Z992 Dependence on renal dialysis: Secondary | ICD-10-CM | POA: Diagnosis not present

## 2019-12-23 DIAGNOSIS — N186 End stage renal disease: Secondary | ICD-10-CM | POA: Diagnosis not present

## 2019-12-24 ENCOUNTER — Telehealth: Payer: Self-pay

## 2019-12-24 NOTE — Telephone Encounter (Signed)
Called lmom informing patient of appointment. klh 

## 2019-12-25 ENCOUNTER — Other Ambulatory Visit: Payer: Self-pay

## 2019-12-25 DIAGNOSIS — Z992 Dependence on renal dialysis: Secondary | ICD-10-CM | POA: Diagnosis not present

## 2019-12-25 DIAGNOSIS — G4733 Obstructive sleep apnea (adult) (pediatric): Secondary | ICD-10-CM

## 2019-12-25 DIAGNOSIS — N2581 Secondary hyperparathyroidism of renal origin: Secondary | ICD-10-CM | POA: Diagnosis not present

## 2019-12-25 DIAGNOSIS — N186 End stage renal disease: Secondary | ICD-10-CM | POA: Diagnosis not present

## 2019-12-25 DIAGNOSIS — D631 Anemia in chronic kidney disease: Secondary | ICD-10-CM | POA: Diagnosis not present

## 2019-12-25 MED ORDER — TRAZODONE HCL 100 MG PO TABS: ORAL_TABLET | ORAL | 0 refills | Status: DC

## 2019-12-28 DIAGNOSIS — N186 End stage renal disease: Secondary | ICD-10-CM | POA: Diagnosis not present

## 2019-12-28 DIAGNOSIS — Z992 Dependence on renal dialysis: Secondary | ICD-10-CM | POA: Diagnosis not present

## 2019-12-28 DIAGNOSIS — N2581 Secondary hyperparathyroidism of renal origin: Secondary | ICD-10-CM | POA: Diagnosis not present

## 2019-12-28 DIAGNOSIS — D631 Anemia in chronic kidney disease: Secondary | ICD-10-CM | POA: Diagnosis not present

## 2019-12-29 ENCOUNTER — Ambulatory Visit (INDEPENDENT_AMBULATORY_CARE_PROVIDER_SITE_OTHER): Payer: Medicare Other | Admitting: Adult Health

## 2019-12-29 ENCOUNTER — Other Ambulatory Visit: Payer: Self-pay

## 2019-12-29 ENCOUNTER — Telehealth: Payer: Self-pay

## 2019-12-29 ENCOUNTER — Encounter: Payer: Self-pay | Admitting: Adult Health

## 2019-12-29 VITALS — BP 134/96 | HR 76 | Resp 16 | Ht 63.0 in | Wt 271.4 lb

## 2019-12-29 DIAGNOSIS — I10 Essential (primary) hypertension: Secondary | ICD-10-CM

## 2019-12-29 DIAGNOSIS — M545 Low back pain, unspecified: Secondary | ICD-10-CM

## 2019-12-29 DIAGNOSIS — E1165 Type 2 diabetes mellitus with hyperglycemia: Secondary | ICD-10-CM

## 2019-12-29 DIAGNOSIS — Z794 Long term (current) use of insulin: Secondary | ICD-10-CM

## 2019-12-29 DIAGNOSIS — N183 Chronic kidney disease, stage 3 unspecified: Secondary | ICD-10-CM

## 2019-12-29 DIAGNOSIS — G4733 Obstructive sleep apnea (adult) (pediatric): Secondary | ICD-10-CM

## 2019-12-29 DIAGNOSIS — Z1211 Encounter for screening for malignant neoplasm of colon: Secondary | ICD-10-CM

## 2019-12-29 DIAGNOSIS — S29012A Strain of muscle and tendon of back wall of thorax, initial encounter: Secondary | ICD-10-CM

## 2019-12-29 MED ORDER — CYCLOBENZAPRINE HCL 5 MG PO TABS: 5.0000 mg | ORAL_TABLET | Freq: Two times a day (BID) | ORAL | 0 refills | Status: DC | PRN

## 2019-12-29 NOTE — Progress Notes (Signed)
[facility] [place], [place] [address]  Internal MEDICINE  Office Visit Note  Patient Name: [name]  [id number]  [id number]  Date of Service: 12/29/2019  Chief Complaint  Patient presents with  . Hypertension  . Hyperlipidemia  . Gastroesophageal Reflux  . Diabetes  . Paperwork    personal care services transition form, pt forgot but will fax them over to [place]  . Medication Refill    cyclobenzaprine  . Quality Metric Gaps    colonoscopy   . Pain    on the right side of his side and back there is constant pain     HPI  Pt is here for follow up on HTN, GERD, DM.  He reports his bp is good when he goes to dialysis.  Today mildly elevated diastolic. Denies Chest pain, Shortness of breath, palpitations, headache, or blurred vision.  Recently had AV fistula created in right upper arm for dialysis.  He is still using his chest for dialysis access.  Right flank pain for about a month. He denies trauma. No blood in urine or stool. Reproducible with palpation. Triligy vent, does not have appropriate parts.  Has not been able to use it for two months. It is worse with movement. He denies any changes in urine or bowel.   Sees endocrinology for DM.   Current Medication: Outpatient Encounter Medications as of 12/29/2019  Medication Sig Note  . albuterol (PROVENTIL HFA;VENTOLIN HFA) 108 (90 Base) MCG/ACT inhaler Inhale 2 puffs into the lungs every 4 (four) hours as needed for wheezing or shortness of breath.   . allopurinol (ZYLOPRIM) 300 MG tablet Take 1 tablet (300 mg total) by mouth daily.   [name] Kitchen amLODipine (NORVASC) 5 MG tablet Take 5 mg by mouth daily.   [name] ELLIPTA 62.5-25 MCG/INH AEPB INHALE 1 PUFF INTO THE LUNGS DAILY. (Patient taking differently: Inhale 1 puff into the lungs daily. )   . aspirin EC 81 MG tablet Take 1 tablet (81 mg total) by mouth daily.   [name] Kitchen atorvastatin (LIPITOR) 40 MG tablet Take 1 tablet (40 mg total) by mouth at bedtime.   .  carvedilol (COREG) 25 MG tablet Take 1 tablet (25 mg total) by mouth every 12 (twelve) hours.   . Cholecalciferol (D3-1000) 25 MCG (1000 UT) tablet Take 2 tablets (2,000 Units total) by mouth daily. (Patient taking differently: Take 2,000 Units by mouth 2 (two) times daily. )   . clobetasol ointment (TEMOVATE) 7.06 % Apply 1 application topically 2 (two) times daily.   . Dulaglutide (TRULICITY) 1.5 CB/7.6EG SOPN Use as directed once a week diag e11.65 (Patient taking differently: Inject 1.5 mg into the skin every 7 (seven) days. Monday)   . DUREZOL 0.05 % EMUL Place 1 drop into the left eye 4 (four) times daily. 12/02/2019: Have not started yet  . furosemide (LASIX) 40 MG tablet TAKE 1 & 1/2 TABLETS BY MOUTH TWICE DAILY (Patient taking differently: Take 40 mg by mouth 2 (two) times daily. )   . gabapentin (NEURONTIN) 300 MG capsule TAKE (1) CAPSULE BY MOUTH TWICE DAILY. (Patient taking differently: Take 300 mg by mouth 2 (two) times daily. )   . GAVILAX 17 GM/SCOOP powder Take 1 Container by mouth daily as needed for mild constipation or moderate constipation.    [name] Kitchen glimepiride (AMARYL) 4 MG tablet Take 4 mg by mouth daily.   . hydrALAZINE (APRESOLINE) 25 MG tablet TAKE 1 TABLET BY MOUTH TWICE DAILY FOR HIGH BLOOD  PRESSURE (Patient taking differently: Take 25 mg by mouth daily. )   . hydrocortisone cream 1 % Apply 1 application topically 2 (two) times daily.   . insulin degludec (TRESIBA FLEXTOUCH) 100 UNIT/ML SOPN FlexTouch Pen Inject 0.16 mLs (16 Units total) into the skin at bedtime.   . isosorbide mononitrate (IMDUR) 60 MG 24 hr tablet TAKE 1 TABLET BY MOUTH ONCE DAILY. (Patient taking differently: Take 60 mg by mouth daily. )   . latanoprost (XALATAN) 0.005 % ophthalmic solution Place 1 drop into both eyes at bedtime.  12/02/2019: Have not started yet  . Lido-Capsaicin-Men-Methyl Sal (1ST MEDX-PATCH/ LIDOCAINE) 4-0.025-5-20 % PTCH Apply 1 patch topically daily.   . magnesium oxide (MAG-OX) 400 MG  tablet Take 400 mg by mouth daily.   . Multiple Vitamins-Minerals (TAB-A-VITE) TABS Take 1 tablet by mouth daily.   . multivitamin (RENA-VIT) TABS tablet Take 1 tablet by mouth at bedtime.   . Nutritional Supplements (FEEDING SUPPLEMENT, NEPRO CARB STEADY,) LIQD Take 237 mLs by mouth 2 (two) times daily between meals.   [name] Kitchen omeprazole (PRILOSEC) 20 MG capsule Take 1 capsule (20 mg total) by mouth 2 (two) times daily before a meal. (Patient taking differently: Take 20 mg by mouth 2 (two) times daily. )   . oxyCODONE-acetaminophen (PERCOCET) 5-325 MG tablet Take 1-2 tablets by mouth every 6 (six) hours as needed for moderate pain or severe pain.   . SKYRIZI, 150 MG DOSE, 75 MG/0.83ML PSKT Inject 300 mg into the skin See admin instructions. Inject 300 mg into the skin every 4 months   . traZODone (DESYREL) 100 MG tablet Take one or one and half tab at night for sleep   . Triamcinolone Acetonide (TRIAMCINOLONE 0.1 % CREAM : EUCERIN) CREA Apply 1 application topically 2 (two) times daily.   [name] Kitchen triamcinolone ointment (KENALOG) 0.5 % Apply 1 application topically 2 (two) times daily.   [name] Kitchen VIGAMOX 0.5 % ophthalmic solution    . cyclobenzaprine (FLEXERIL) 5 MG tablet Take 1 tablet (5 mg total) by mouth 2 (two) times daily as needed for muscle spasms.    No facility-administered encounter medications on file as of 12/29/2019.    Surgical History: Past Surgical History:  Procedure Laterality Date  . AV FISTULA PLACEMENT Right 11/20/2019   Procedure: ARTERIOVENOUS (AV) FISTULA CREATION ( BRACHIAL CEPHALIC );  Surgeon: Katha Cabal, MD;  Location: [facility] ORS;  Service: Vascular;  Laterality: Right;  . CATARACT EXTRACTION W/PHACO Left 12/17/2019   Procedure: CATARACT EXTRACTION PHACO AND INTRAOCULAR LENS PLACEMENT ([place]) LEFT ISTENT INJ DIABETIC;  Surgeon: Eulogio Bear, MD;  Location: [facility] ORS;  Service: Ophthalmology;  Laterality: Left;  [place] 00:33.2 CDE 2.96 Fluid Pack Lot # [id number]  . CORONARY  ANGIOPLASTY WITH STENT PLACEMENT     stent placement  . DIALYSIS/PERMA CATHETER INSERTION N/A 06/22/2019   Procedure: DIALYSIS/PERMA CATHETER INSERTION;  Surgeon: Algernon Huxley, MD;  Location: [facility];  Service: Cardiovascular;  Laterality: N/A;  . INSERTION OF AHMED VALVE Left 12/17/2019   Procedure: INSERTION OF iSTENT;  Surgeon: Eulogio Bear, MD;  Location: [facility] ORS;  Service: Ophthalmology;  Laterality: Left;    Medical History: Past Medical History:  Diagnosis Date  . Bell's palsy   . Bell's palsy   . Cataract   . CHF (congestive heart failure) ([place])   . Chronic kidney disease   . COPD (chronic obstructive pulmonary disease) ([place])   . Coronary artery disease   . Diabetes mellitus without complication ([place])   .  Dyspnea    Wheezing  . GERD (gastroesophageal reflux disease)   . Gout   . HOH (hard of hearing)    mild  . Hypercholesterolemia   . Hypertension   . NSVT (nonsustained ventricular tachycardia) ([place])   . Obstructive sleep apnea    CPAP, O2 use continuosly at 3LPM  . Orthopnea   . Oxygen dependent    3 lpm continuous  . Pneumonia    In Past X2 most recent 1 yr ago  . Psoriasis   . Psoriasis   . Pulmonary HTN ([place])   . Renal failure     Family History: Family History  Problem Relation Age of Onset  . Heart failure Mother   . Kidney failure Brother     Social History   Socioeconomic History  . Marital status: Single    Spouse name: Not on file  . Number of children: Not on file  . Years of education: Not on file  . Highest education level: Not on file  Occupational History  . Not on file  Tobacco Use  . Smoking status: Never Smoker  . Smokeless tobacco: Never Used  Substance and Sexual Activity  . Alcohol use: Not Currently    Alcohol/week: 1.0 standard drinks    Types: 1 Cans of beer per week  . Drug use: Not Currently    Types: Marijuana    Comment: in high school   . Sexual activity: Not on file  Other Topics Concern  .  Not on file  Social History Narrative  . Not on file   Social Determinants of Health   Financial Resource Strain:   . Difficulty of Paying Living Expenses: Not on file  Food Insecurity:   . Worried About Charity fundraiser in the Last Year: Not on file  . Ran Out of Food in the Last Year: Not on file  Transportation Needs:   . Lack of Transportation (Medical): Not on file  . Lack of Transportation (Non-Medical): Not on file  Physical Activity:   . Days of Exercise per Week: Not on file  . Minutes of Exercise per Session: Not on file  Stress:   . Feeling of Stress : Not on file  Social Connections:   . Frequency of Communication with Friends and Family: Not on file  . Frequency of Social Gatherings with Friends and Family: Not on file  . Attends Religious Services: Not on file  . Active Member of Clubs or Organizations: Not on file  . Attends Archivist Meetings: Not on file  . Marital Status: Not on file  Intimate Partner Violence:   . Fear of Current or Ex-Partner: Not on file  . Emotionally Abused: Not on file  . Physically Abused: Not on file  . Sexually Abused: Not on file      Review of Systems  Constitutional: Negative.  Negative for chills, fatigue and unexpected weight change.  HENT: Negative.  Negative for congestion, rhinorrhea, sneezing and sore throat.   Eyes: Negative for redness.  Respiratory: Negative.  Negative for cough, chest tightness and shortness of breath.   Cardiovascular: Negative.  Negative for chest pain and palpitations.  Gastrointestinal: Negative.  Negative for abdominal pain, constipation, diarrhea, nausea and vomiting.  Endocrine: Negative.   Genitourinary: Negative.  Negative for dysuria and frequency.  Musculoskeletal: Negative.  Negative for arthralgias, back pain, joint swelling and neck pain.  Skin: Negative.  Negative for rash.  Allergic/Immunologic: Negative.   Neurological: Negative.  Negative for tremors and numbness.   Hematological: Negative for adenopathy. Does not bruise/bleed easily.  Psychiatric/Behavioral: Negative.  Negative for behavioral problems, sleep disturbance and suicidal ideas. The patient is not nervous/anxious.     Vital Signs: BP (!) 134/96   Pulse 76   Resp 16   Ht 5\' 3"  (1.6 m)   Wt 271 lb 6.4 oz (123.1 kg)   SpO2 99%   BMI 48.08 kg/m    Physical Exam Vitals and nursing note reviewed.  Constitutional:      General: He is not in acute distress.    Appearance: He is well-developed. He is not diaphoretic.  HENT:     Head: Normocephalic and atraumatic.     Mouth/Throat:     Pharynx: No oropharyngeal exudate.  Eyes:     Pupils: Pupils are equal, round, and reactive to light.  Neck:     Thyroid: No thyromegaly.     Vascular: No JVD.     Trachea: No tracheal deviation.  Cardiovascular:     Rate and Rhythm: Normal rate and regular rhythm.     Heart sounds: Normal heart sounds. No murmur. No friction rub. No gallop.   Pulmonary:     Effort: Pulmonary effort is normal. No respiratory distress.     Breath sounds: Normal breath sounds. No wheezing or rales.  Chest:     Chest wall: No tenderness.  Abdominal:     Palpations: Abdomen is soft.     Tenderness: There is no abdominal tenderness. There is no guarding.  Musculoskeletal:        General: Normal range of motion.     Cervical back: Normal range of motion and neck supple.  Lymphadenopathy:     Cervical: No cervical adenopathy.  Skin:    General: Skin is warm and dry.  Neurological:     Mental Status: He is alert and oriented to person, place, and time.     Cranial Nerves: No cranial nerve deficit.  Psychiatric:        Behavior: Behavior normal.        Thought Content: Thought content normal.        Judgment: Judgment normal.      Assessment/Plan: 1. Type 2 diabetes mellitus with hyperglycemia, with long-term current use of insulin (HCC) Continue to see endocrinology as scheduled.   2. Essential  hypertension Slightly elevated today, patient has not taken blood pressure medications today. Discussed importance of compliance.   3. Left low back pain, unspecified chronicity, unspecified whether sciatica present Continue to use flexeril, last RX 01/18/2018.  Continue to use as needed. - cyclobenzaprine (FLEXERIL) 5 MG tablet; Take 1 tablet (5 mg total) by mouth 2 (two) times daily as needed for muscle spasms.  Dispense: 60 tablet; Refill: 0  4. Screen for colon cancer New referral for colonoscopy.  - Ambulatory referral to Gastroenterology  5. Morbid obesity (HCC) Obesity Counseling: Risk Assessment: An assessment of behavioral risk factors was made today and includes lack of exercise sedentary lifestyle, lack of portion control and poor dietary habits.  Risk Modification Advice: She was counseled on portion control guidelines. Restricting daily caloric intake to 1800. The detrimental long term effects of obesity on her health and ongoing poor compliance was also discussed with the patient.  6. Stage 3 chronic kidney disease, unspecified whether stage 3a or 3b CKD New AV fistula is healing, on dialysis MWF  7. Obstructive sleep apnea On Triligy vent, has not been able to use for 2  months.  Needs parts.  Will contact [facility] to follow up with patient.    8. Muscle strain of right upper back, initial encounter Discussed heating pad as well as topical medication use.  May also use flexeril for muscle relaxation.  Return to office if pain fails to improve.   General Counseling: hernan turnage understanding of the findings of todays visit and agrees with plan of treatment. I have discussed any further diagnostic evaluation that may be needed or ordered today. We also reviewed his medications today. he has been encouraged to call the office with any questions or concerns that should arise related to todays visit.    Orders Placed This Encounter  Procedures  . Ambulatory  referral to Gastroenterology    Meds ordered this encounter  Medications  . cyclobenzaprine (FLEXERIL) 5 MG tablet    Sig: Take 1 tablet (5 mg total) by mouth 2 (two) times daily as needed for muscle spasms.    Dispense:  60 tablet    Refill:  0    Time spent: 30 Minutes   This patient was seen by Orson Gear AGNP-C in Collaboration with Dr Lavera Guise as a part of collaborative care agreement     Kendell Bane AGNP-C Internal medicine

## 2019-12-29 NOTE — Telephone Encounter (Signed)
Contacted patient to discuss scheduling colonoscopy.  Patient was open to being scheduled at [facility], however I explained to him after reviewing his medical history(BMI 48) he would be better suited for [facility]-and unfortunately we are not able to schedule for [facility] due to COVID restrictions.  Reminder created to call patient to schedule at [facility] once restrictions have been lifted. Noted in referral.  Thanks Sharyn Lull

## 2019-12-30 ENCOUNTER — Telehealth: Payer: Self-pay

## 2019-12-30 DIAGNOSIS — N186 End stage renal disease: Secondary | ICD-10-CM | POA: Diagnosis not present

## 2019-12-30 DIAGNOSIS — Z992 Dependence on renal dialysis: Secondary | ICD-10-CM | POA: Diagnosis not present

## 2019-12-30 DIAGNOSIS — D631 Anemia in chronic kidney disease: Secondary | ICD-10-CM | POA: Diagnosis not present

## 2019-12-30 DIAGNOSIS — N2581 Secondary hyperparathyroidism of renal origin: Secondary | ICD-10-CM | POA: Diagnosis not present

## 2019-12-30 NOTE — Telephone Encounter (Signed)
Spoke with [name]) and she will reach out to resp tech and have them go by patient home and service vent machine, they will be calling pt as well. [name]

## 2020-01-01 DIAGNOSIS — N2581 Secondary hyperparathyroidism of renal origin: Secondary | ICD-10-CM | POA: Diagnosis not present

## 2020-01-01 DIAGNOSIS — N186 End stage renal disease: Secondary | ICD-10-CM | POA: Diagnosis not present

## 2020-01-01 DIAGNOSIS — Z992 Dependence on renal dialysis: Secondary | ICD-10-CM | POA: Diagnosis not present

## 2020-01-01 DIAGNOSIS — D631 Anemia in chronic kidney disease: Secondary | ICD-10-CM | POA: Diagnosis not present

## 2020-01-04 DIAGNOSIS — N2581 Secondary hyperparathyroidism of renal origin: Secondary | ICD-10-CM | POA: Diagnosis not present

## 2020-01-04 DIAGNOSIS — Z992 Dependence on renal dialysis: Secondary | ICD-10-CM | POA: Diagnosis not present

## 2020-01-04 DIAGNOSIS — N186 End stage renal disease: Secondary | ICD-10-CM | POA: Diagnosis not present

## 2020-01-04 DIAGNOSIS — D631 Anemia in chronic kidney disease: Secondary | ICD-10-CM | POA: Diagnosis not present

## 2020-01-06 DIAGNOSIS — N186 End stage renal disease: Secondary | ICD-10-CM | POA: Diagnosis not present

## 2020-01-06 DIAGNOSIS — N2581 Secondary hyperparathyroidism of renal origin: Secondary | ICD-10-CM | POA: Diagnosis not present

## 2020-01-06 DIAGNOSIS — D631 Anemia in chronic kidney disease: Secondary | ICD-10-CM | POA: Diagnosis not present

## 2020-01-06 DIAGNOSIS — Z992 Dependence on renal dialysis: Secondary | ICD-10-CM | POA: Diagnosis not present

## 2020-01-08 ENCOUNTER — Telehealth: Payer: Self-pay

## 2020-01-08 DIAGNOSIS — N186 End stage renal disease: Secondary | ICD-10-CM | POA: Diagnosis not present

## 2020-01-08 DIAGNOSIS — Z992 Dependence on renal dialysis: Secondary | ICD-10-CM | POA: Diagnosis not present

## 2020-01-08 DIAGNOSIS — N2581 Secondary hyperparathyroidism of renal origin: Secondary | ICD-10-CM | POA: Diagnosis not present

## 2020-01-08 DIAGNOSIS — D631 Anemia in chronic kidney disease: Secondary | ICD-10-CM | POA: Diagnosis not present

## 2020-01-08 NOTE — Telephone Encounter (Signed)
Confirmed virtual visit with patient. klh 

## 2020-01-11 DIAGNOSIS — D631 Anemia in chronic kidney disease: Secondary | ICD-10-CM | POA: Diagnosis not present

## 2020-01-11 DIAGNOSIS — N186 End stage renal disease: Secondary | ICD-10-CM | POA: Diagnosis not present

## 2020-01-11 DIAGNOSIS — N2581 Secondary hyperparathyroidism of renal origin: Secondary | ICD-10-CM | POA: Diagnosis not present

## 2020-01-11 DIAGNOSIS — Z992 Dependence on renal dialysis: Secondary | ICD-10-CM | POA: Diagnosis not present

## 2020-01-12 ENCOUNTER — Encounter: Payer: Self-pay | Admitting: *Deleted

## 2020-01-12 ENCOUNTER — Ambulatory Visit (INDEPENDENT_AMBULATORY_CARE_PROVIDER_SITE_OTHER): Payer: Medicare Other | Admitting: Adult Health

## 2020-01-12 ENCOUNTER — Encounter: Payer: Self-pay | Admitting: Adult Health

## 2020-01-12 VITALS — Ht 63.0 in

## 2020-01-12 DIAGNOSIS — E1165 Type 2 diabetes mellitus with hyperglycemia: Secondary | ICD-10-CM | POA: Diagnosis not present

## 2020-01-12 DIAGNOSIS — R609 Edema, unspecified: Secondary | ICD-10-CM | POA: Diagnosis not present

## 2020-01-12 DIAGNOSIS — Z794 Long term (current) use of insulin: Secondary | ICD-10-CM | POA: Diagnosis not present

## 2020-01-12 DIAGNOSIS — M545 Low back pain, unspecified: Secondary | ICD-10-CM

## 2020-01-12 DIAGNOSIS — R790 Abnormal level of blood mineral: Secondary | ICD-10-CM | POA: Diagnosis not present

## 2020-01-12 DIAGNOSIS — N183 Chronic kidney disease, stage 3 unspecified: Secondary | ICD-10-CM | POA: Diagnosis not present

## 2020-01-12 DIAGNOSIS — Z9981 Dependence on supplemental oxygen: Secondary | ICD-10-CM | POA: Diagnosis not present

## 2020-01-12 DIAGNOSIS — I10 Essential (primary) hypertension: Secondary | ICD-10-CM

## 2020-01-12 MED ORDER — METOLAZONE 5 MG PO TABS: 5.0000 mg | ORAL_TABLET | Freq: Every day | ORAL | 1 refills | Status: DC

## 2020-01-12 MED ORDER — MAGNESIUM OXIDE -MG SUPPLEMENT 200 MG PO TABS: 1.0000 | ORAL_TABLET | Freq: Every day | ORAL | 0 refills | Status: DC

## 2020-01-12 NOTE — Progress Notes (Signed)
[facility] [place], [place] [address]  Internal MEDICINE  Telephone Visit  Patient Name: [name]  [id number]  [id number]  Date of Service: 01/12/2020  I connected with the patient at 1212 by telephone and verified the patients identity using two identifiers.   I discussed the limitations, risks, security and privacy concerns of performing an evaluation and management service by telephone and the availability of in person appointments. I also discussed with the patient that there may be a patient responsible charge related to the service.  The patient expressed understanding and agrees to proceed.    Chief Complaint  Patient presents with  . Telephone Assessment    main concern is metolazone 5 mg he was recieving as a fluid booster, takes one hour before taking furosemide  . Telephone Screen  . Medication Management    vitamin d3 not in pill pack but would like to take,, does not believe you prescribed but he had medication before for his kidneys and would like some, pt does not have imdur, does he need to continue treatment, magnesium has been low at dialysis, does not take magnesium oxide, needs oxycodone     HPI  PT seen via video. His medications are reviewed extensively.  He is concerned because he was taking Vit D3 and he does not believe it is in his pill pack anymore.  Also he magnesium level has been low at dialysis, and nephrology told him to ask pcp for magnesium oxide to help with this. He also states that he was taking Metolazone about an hour before his lasix to help with fluid, and he would like more of that.  He takes it as needed.  Overall he is at baseline.  He does have pain and would like more oxycodone. We discussed that he should be evaluated by pain mgmt going forward.  His DM is at his baseline.  He denies any other issues.    Current Medication: Outpatient Encounter Medications as of 01/12/2020  Medication Sig Note  . albuterol  (PROVENTIL HFA;VENTOLIN HFA) 108 (90 Base) MCG/ACT inhaler Inhale 2 puffs into the lungs every 4 (four) hours as needed for wheezing or shortness of breath.   . allopurinol (ZYLOPRIM) 300 MG tablet Take 1 tablet (300 mg total) by mouth daily.   [name] Kitchen amLODipine (NORVASC) 5 MG tablet Take 5 mg by mouth daily.   [name] ELLIPTA 62.5-25 MCG/INH AEPB INHALE 1 PUFF INTO THE LUNGS DAILY. (Patient taking differently: Inhale 1 puff into the lungs daily. )   . aspirin EC 81 MG tablet Take 1 tablet (81 mg total) by mouth daily.   [name] Kitchen atorvastatin (LIPITOR) 40 MG tablet Take 1 tablet (40 mg total) by mouth at bedtime.   . carvedilol (COREG) 25 MG tablet Take 1 tablet (25 mg total) by mouth every 12 (twelve) hours.   . Cholecalciferol (D3-1000) 25 MCG (1000 UT) tablet Take 2 tablets (2,000 Units total) by mouth daily. (Patient taking differently: Take 2,000 Units by mouth 2 (two) times daily. )   . clobetasol ointment (TEMOVATE) 9.51 % Apply 1 application topically 2 (two) times daily.   . cyclobenzaprine (FLEXERIL) 5 MG tablet Take 1 tablet (5 mg total) by mouth 2 (two) times daily as needed for muscle spasms.   . Dulaglutide (TRULICITY) 1.5 OA/4.1YS SOPN Use as directed once a week diag e11.65 (Patient taking differently: Inject 1.5 mg into the skin every 7 (seven) days. Monday)   . DUREZOL 0.05 %  EMUL Place 1 drop into the left eye 4 (four) times daily. 12/02/2019: Have not started yet  . furosemide (LASIX) 40 MG tablet TAKE 1 & 1/2 TABLETS BY MOUTH TWICE DAILY (Patient taking differently: Take 40 mg by mouth 2 (two) times daily. )   . gabapentin (NEURONTIN) 300 MG capsule TAKE (1) CAPSULE BY MOUTH TWICE DAILY. (Patient taking differently: Take 300 mg by mouth 2 (two) times daily. )   . GAVILAX 17 GM/SCOOP powder Take 1 Container by mouth daily as needed for mild constipation or moderate constipation.    [name] Kitchen glimepiride (AMARYL) 4 MG tablet Take 4 mg by mouth daily.   . hydrALAZINE (APRESOLINE) 25 MG tablet TAKE 1  TABLET BY MOUTH TWICE DAILY FOR HIGH BLOOD PRESSURE (Patient taking differently: Take 25 mg by mouth daily. )   . hydrocortisone cream 1 % Apply 1 application topically 2 (two) times daily.   . insulin degludec (TRESIBA FLEXTOUCH) 100 UNIT/ML SOPN FlexTouch Pen Inject 0.16 mLs (16 Units total) into the skin at bedtime.   . isosorbide mononitrate (IMDUR) 60 MG 24 hr tablet TAKE 1 TABLET BY MOUTH ONCE DAILY. (Patient taking differently: Take 60 mg by mouth daily. )   . latanoprost (XALATAN) 0.005 % ophthalmic solution Place 1 drop into both eyes at bedtime.  12/02/2019: Have not started yet  . Lido-Capsaicin-Men-Methyl Sal (1ST MEDX-PATCH/ LIDOCAINE) 4-0.025-5-20 % PTCH Apply 1 patch topically daily.   . magnesium oxide (MAG-OX) 400 MG tablet Take 400 mg by mouth daily.   . Multiple Vitamins-Minerals (TAB-A-VITE) TABS Take 1 tablet by mouth daily.   . multivitamin (RENA-VIT) TABS tablet Take 1 tablet by mouth at bedtime.   [name] Kitchen omeprazole (PRILOSEC) 20 MG capsule Take 1 capsule (20 mg total) by mouth 2 (two) times daily before a meal. (Patient taking differently: Take 20 mg by mouth 2 (two) times daily. )   . oxyCODONE-acetaminophen (PERCOCET) 5-325 MG tablet Take 1-2 tablets by mouth every 6 (six) hours as needed for moderate pain or severe pain.   . SKYRIZI, 150 MG DOSE, 75 MG/0.83ML PSKT Inject 300 mg into the skin See admin instructions. Inject 300 mg into the skin every 4 months   . traZODone (DESYREL) 100 MG tablet Take one or one and half tab at night for sleep   . Triamcinolone Acetonide (TRIAMCINOLONE 0.1 % CREAM : EUCERIN) CREA Apply 1 application topically 2 (two) times daily.   [name] Kitchen triamcinolone ointment (KENALOG) 0.5 % Apply 1 application topically 2 (two) times daily.   [name] Kitchen VIGAMOX 0.5 % ophthalmic solution    . [DISCONTINUED] Nutritional Supplements (FEEDING SUPPLEMENT, NEPRO CARB STEADY,) LIQD Take 237 mLs by mouth 2 (two) times daily between meals. (Patient not taking: Reported on 01/12/2020)     No facility-administered encounter medications on file as of 01/12/2020.    Surgical History: Past Surgical History:  Procedure Laterality Date  . AV FISTULA PLACEMENT Right 11/20/2019   Procedure: ARTERIOVENOUS (AV) FISTULA CREATION ( BRACHIAL CEPHALIC );  Surgeon: Katha Cabal, MD;  Location: [facility] ORS;  Service: Vascular;  Laterality: Right;  . CATARACT EXTRACTION W/PHACO Left 12/17/2019   Procedure: CATARACT EXTRACTION PHACO AND INTRAOCULAR LENS PLACEMENT ([place]) LEFT ISTENT INJ DIABETIC;  Surgeon: Eulogio Bear, MD;  Location: [facility] ORS;  Service: Ophthalmology;  Laterality: Left;  [place] 00:33.2 CDE 2.96 Fluid Pack Lot # [id number]  . CORONARY ANGIOPLASTY WITH STENT PLACEMENT     stent placement  . DIALYSIS/PERMA CATHETER INSERTION N/A 06/22/2019   Procedure: DIALYSIS/PERMA CATHETER  INSERTION;  Surgeon: Algernon Huxley, MD;  Location: [facility];  Service: Cardiovascular;  Laterality: N/A;  . INSERTION OF AHMED VALVE Left 12/17/2019   Procedure: INSERTION OF iSTENT;  Surgeon: Eulogio Bear, MD;  Location: [facility] ORS;  Service: Ophthalmology;  Laterality: Left;    Medical History: Past Medical History:  Diagnosis Date  . Bell's palsy   . Bell's palsy   . Cataract   . CHF (congestive heart failure) ([place])   . Chronic kidney disease   . COPD (chronic obstructive pulmonary disease) ([place])   . Coronary artery disease   . Diabetes mellitus without complication ([place])   . Dyspnea    Wheezing  . GERD (gastroesophageal reflux disease)   . Gout   . HOH (hard of hearing)    mild  . Hypercholesterolemia   . Hypertension   . NSVT (nonsustained ventricular tachycardia) ([place])   . Obstructive sleep apnea    CPAP, O2 use continuosly at 3LPM  . Orthopnea   . Oxygen dependent    3 lpm continuous  . Pneumonia    In Past X2 most recent 1 yr ago  . Psoriasis   . Psoriasis   . Pulmonary HTN ([place])   . Renal failure     Family History: Family History  Problem Relation Age  of Onset  . Heart failure Mother   . Kidney failure Brother     Social History   Socioeconomic History  . Marital status: Single    Spouse name: Not on file  . Number of children: Not on file  . Years of education: Not on file  . Highest education level: Not on file  Occupational History  . Not on file  Tobacco Use  . Smoking status: Never Smoker  . Smokeless tobacco: Never Used  Substance and Sexual Activity  . Alcohol use: Not Currently    Alcohol/week: 1.0 standard drinks    Types: 1 Cans of beer per week  . Drug use: Not Currently    Types: Marijuana    Comment: in high school   . Sexual activity: Not on file  Other Topics Concern  . Not on file  Social History Narrative  . Not on file   Social Determinants of Health   Financial Resource Strain:   . Difficulty of Paying Living Expenses: Not on file  Food Insecurity:   . Worried About Charity fundraiser in the Last Year: Not on file  . Ran Out of Food in the Last Year: Not on file  Transportation Needs:   . Lack of Transportation (Medical): Not on file  . Lack of Transportation (Non-Medical): Not on file  Physical Activity:   . Days of Exercise per Week: Not on file  . Minutes of Exercise per Session: Not on file  Stress:   . Feeling of Stress : Not on file  Social Connections:   . Frequency of Communication with Friends and Family: Not on file  . Frequency of Social Gatherings with Friends and Family: Not on file  . Attends Religious Services: Not on file  . Active Member of Clubs or Organizations: Not on file  . Attends Archivist Meetings: Not on file  . Marital Status: Not on file  Intimate Partner Violence:   . Fear of Current or Ex-Partner: Not on file  . Emotionally Abused: Not on file  . Physically Abused: Not on file  . Sexually Abused: Not on file  Review of Systems  Constitutional: Negative.  Negative for chills, fatigue and unexpected weight change.  HENT: Negative.   Negative for congestion, rhinorrhea, sneezing and sore throat.   Eyes: Negative for redness.  Respiratory: Negative.  Negative for cough, chest tightness and shortness of breath.   Cardiovascular: Negative.  Negative for chest pain and palpitations.  Gastrointestinal: Negative.  Negative for abdominal pain, constipation, diarrhea, nausea and vomiting.  Endocrine: Negative.   Genitourinary: Negative.  Negative for dysuria and frequency.  Musculoskeletal: Negative.  Negative for arthralgias, back pain, joint swelling and neck pain.  Skin: Negative.  Negative for rash.  Allergic/Immunologic: Negative.   Neurological: Negative.  Negative for tremors and numbness.  Hematological: Negative for adenopathy. Does not bruise/bleed easily.  Psychiatric/Behavioral: Negative.  Negative for behavioral problems, sleep disturbance and suicidal ideas. The patient is not nervous/anxious.     Vital Signs: Ht 5\' 3"  (1.6 m)   BMI 48.08 kg/m    Observation/Objective:  Well appearing, NAD noted.    Assessment/Plan: 1. Fluid retention Use metolazone as discussed.  - metolazone (ZAROXOLYN) 5 MG tablet; Take 1 tablet (5 mg total) by mouth daily.  Dispense: 30 tablet; Refill: 1  2. Essential hypertension No symptoms of HTN. Continue present mgmt.   3. Type 2 diabetes mellitus with hyperglycemia, with long-term current use of insulin (HCC) Continue to use medications as prescribed.   4. Left low back pain, unspecified chronicity, unspecified whether sciatica present Referral to pain clinic for ongoing pain.  Declined to give him narcotics - Ambulatory referral to Pain Clinic  5. Morbid obesity (HCC) Obesity Counseling: Risk Assessment: An assessment of behavioral risk factors was made today and includes lack of exercise sedentary lifestyle, lack of portion control and poor dietary habits.  Risk Modification Advice: She was counseled on portion control guidelines. Restricting daily caloric intake to  1800. The detrimental long term effects of obesity on her health and ongoing poor compliance was also discussed with the patient.  6. Stage 3 chronic kidney disease, unspecified whether stage 3a or 3b CKD Continue with dialysis, and nephrology management.  7. Low blood magnesium level Use mag ox as discussed.  - Magnesium Oxide 200 MG TABS; Take 1 tablet (200 mg total) by mouth daily.  Dispense: 20 tablet; Refill: 0  8. Oxygen dependent Continue to use oxygen as prescribed.   General Counseling: steele stracener understanding of the findings of today's phone visit and agrees with plan of treatment. I have discussed any further diagnostic evaluation that may be needed or ordered today. We also reviewed his medications today. he has been encouraged to call the office with any questions or concerns that should arise related to todays visit.    No orders of the defined types were placed in this encounter.   No orders of the defined types were placed in this encounter.   Time spent:  [facility] Internal medicine

## 2020-01-13 DIAGNOSIS — D631 Anemia in chronic kidney disease: Secondary | ICD-10-CM | POA: Diagnosis not present

## 2020-01-13 DIAGNOSIS — N186 End stage renal disease: Secondary | ICD-10-CM | POA: Diagnosis not present

## 2020-01-13 DIAGNOSIS — N2581 Secondary hyperparathyroidism of renal origin: Secondary | ICD-10-CM | POA: Diagnosis not present

## 2020-01-13 DIAGNOSIS — Z992 Dependence on renal dialysis: Secondary | ICD-10-CM | POA: Diagnosis not present

## 2020-01-14 DIAGNOSIS — L400 Psoriasis vulgaris: Secondary | ICD-10-CM | POA: Diagnosis not present

## 2020-01-15 DIAGNOSIS — D631 Anemia in chronic kidney disease: Secondary | ICD-10-CM | POA: Diagnosis not present

## 2020-01-15 DIAGNOSIS — N186 End stage renal disease: Secondary | ICD-10-CM | POA: Diagnosis not present

## 2020-01-15 DIAGNOSIS — N2581 Secondary hyperparathyroidism of renal origin: Secondary | ICD-10-CM | POA: Diagnosis not present

## 2020-01-15 DIAGNOSIS — Z992 Dependence on renal dialysis: Secondary | ICD-10-CM | POA: Diagnosis not present

## 2020-01-17 DIAGNOSIS — N186 End stage renal disease: Secondary | ICD-10-CM | POA: Diagnosis not present

## 2020-01-17 DIAGNOSIS — Z992 Dependence on renal dialysis: Secondary | ICD-10-CM | POA: Diagnosis not present

## 2020-01-18 DIAGNOSIS — D631 Anemia in chronic kidney disease: Secondary | ICD-10-CM | POA: Diagnosis not present

## 2020-01-18 DIAGNOSIS — Z992 Dependence on renal dialysis: Secondary | ICD-10-CM | POA: Diagnosis not present

## 2020-01-18 DIAGNOSIS — N2581 Secondary hyperparathyroidism of renal origin: Secondary | ICD-10-CM | POA: Diagnosis not present

## 2020-01-18 DIAGNOSIS — N186 End stage renal disease: Secondary | ICD-10-CM | POA: Diagnosis not present

## 2020-01-20 DIAGNOSIS — D631 Anemia in chronic kidney disease: Secondary | ICD-10-CM | POA: Diagnosis not present

## 2020-01-20 DIAGNOSIS — Z992 Dependence on renal dialysis: Secondary | ICD-10-CM | POA: Diagnosis not present

## 2020-01-20 DIAGNOSIS — N186 End stage renal disease: Secondary | ICD-10-CM | POA: Diagnosis not present

## 2020-01-20 DIAGNOSIS — N2581 Secondary hyperparathyroidism of renal origin: Secondary | ICD-10-CM | POA: Diagnosis not present

## 2020-01-22 ENCOUNTER — Other Ambulatory Visit: Payer: Self-pay | Admitting: Adult Health

## 2020-01-22 ENCOUNTER — Other Ambulatory Visit: Payer: Self-pay

## 2020-01-22 DIAGNOSIS — N186 End stage renal disease: Secondary | ICD-10-CM | POA: Diagnosis not present

## 2020-01-22 DIAGNOSIS — Z992 Dependence on renal dialysis: Secondary | ICD-10-CM | POA: Diagnosis not present

## 2020-01-22 DIAGNOSIS — N2581 Secondary hyperparathyroidism of renal origin: Secondary | ICD-10-CM | POA: Diagnosis not present

## 2020-01-22 DIAGNOSIS — D631 Anemia in chronic kidney disease: Secondary | ICD-10-CM | POA: Diagnosis not present

## 2020-01-22 MED ORDER — TRULICITY 1.5 MG/0.5ML ~~LOC~~ SOAJ: 1.5000 mg | SUBCUTANEOUS | 3 refills | Status: DC

## 2020-01-25 DIAGNOSIS — N186 End stage renal disease: Secondary | ICD-10-CM | POA: Diagnosis not present

## 2020-01-25 DIAGNOSIS — Z992 Dependence on renal dialysis: Secondary | ICD-10-CM | POA: Diagnosis not present

## 2020-01-25 DIAGNOSIS — D631 Anemia in chronic kidney disease: Secondary | ICD-10-CM | POA: Diagnosis not present

## 2020-01-25 DIAGNOSIS — N2581 Secondary hyperparathyroidism of renal origin: Secondary | ICD-10-CM | POA: Diagnosis not present

## 2020-01-27 DIAGNOSIS — N186 End stage renal disease: Secondary | ICD-10-CM | POA: Diagnosis not present

## 2020-01-27 DIAGNOSIS — Z992 Dependence on renal dialysis: Secondary | ICD-10-CM | POA: Diagnosis not present

## 2020-01-27 DIAGNOSIS — N2581 Secondary hyperparathyroidism of renal origin: Secondary | ICD-10-CM | POA: Diagnosis not present

## 2020-01-27 DIAGNOSIS — D631 Anemia in chronic kidney disease: Secondary | ICD-10-CM | POA: Diagnosis not present

## 2020-01-29 DIAGNOSIS — N186 End stage renal disease: Secondary | ICD-10-CM | POA: Diagnosis not present

## 2020-01-29 DIAGNOSIS — Z992 Dependence on renal dialysis: Secondary | ICD-10-CM | POA: Diagnosis not present

## 2020-01-29 DIAGNOSIS — N2581 Secondary hyperparathyroidism of renal origin: Secondary | ICD-10-CM | POA: Diagnosis not present

## 2020-01-29 DIAGNOSIS — D631 Anemia in chronic kidney disease: Secondary | ICD-10-CM | POA: Diagnosis not present

## 2020-02-01 ENCOUNTER — Other Ambulatory Visit: Payer: Self-pay

## 2020-02-01 ENCOUNTER — Ambulatory Visit (INDEPENDENT_AMBULATORY_CARE_PROVIDER_SITE_OTHER): Payer: Medicare Other | Admitting: Vascular Surgery

## 2020-02-01 ENCOUNTER — Ambulatory Visit (INDEPENDENT_AMBULATORY_CARE_PROVIDER_SITE_OTHER): Payer: Medicare Other

## 2020-02-01 ENCOUNTER — Encounter (INDEPENDENT_AMBULATORY_CARE_PROVIDER_SITE_OTHER): Payer: Self-pay | Admitting: Vascular Surgery

## 2020-02-01 VITALS — BP 114/69 | HR 84 | Resp 12 | Ht 63.0 in | Wt 273.0 lb

## 2020-02-01 DIAGNOSIS — N186 End stage renal disease: Secondary | ICD-10-CM

## 2020-02-01 NOTE — Progress Notes (Signed)
Patient ID: [name], male   DOB: 1966/03/19, 54 y.o.   MRN: [id number]  Chief Complaint  Patient presents with  . Follow-up    6WK HDA    HPI DEAKON FRIX is a 54 y.o. male.    Creation of a right brachiocephalic fistula on 13/01/4400.  Patient notes that his postoperative pain although difficult to control initially is now essentially gone.  Previously noted swelling is largely gone  Past Medical History:  Diagnosis Date  . Bell's palsy   . Bell's palsy   . Cataract   . CHF (congestive heart failure) ([place])   . Chronic kidney disease   . COPD (chronic obstructive pulmonary disease) ([place])   . Coronary artery disease   . Diabetes mellitus without complication ([place])   . Dyspnea    Wheezing  . GERD (gastroesophageal reflux disease)   . Gout   . HOH (hard of hearing)    mild  . Hypercholesterolemia   . Hypertension   . NSVT (nonsustained ventricular tachycardia) ([place])   . Obstructive sleep apnea    CPAP, O2 use continuosly at 3LPM  . Orthopnea   . Oxygen dependent    3 lpm continuous  . Pneumonia    In Past X2 most recent 1 yr ago  . Psoriasis   . Psoriasis   . Pulmonary HTN ([place])   . Renal failure     Past Surgical History:  Procedure Laterality Date  . AV FISTULA PLACEMENT Right 11/20/2019   Procedure: ARTERIOVENOUS (AV) FISTULA CREATION ( BRACHIAL CEPHALIC );  Surgeon: Katha Cabal, MD;  Location: [facility] ORS;  Service: Vascular;  Laterality: Right;  . CATARACT EXTRACTION W/PHACO Left 12/17/2019   Procedure: CATARACT EXTRACTION PHACO AND INTRAOCULAR LENS PLACEMENT ([place]) LEFT ISTENT INJ DIABETIC;  Surgeon: Eulogio Bear, MD;  Location: [facility] ORS;  Service: Ophthalmology;  Laterality: Left;  [place] 00:33.2 CDE 2.96 Fluid Pack Lot # [id number]  . CORONARY ANGIOPLASTY WITH STENT PLACEMENT     stent placement  . DIALYSIS/PERMA CATHETER INSERTION N/A 06/22/2019   Procedure: DIALYSIS/PERMA CATHETER INSERTION;  Surgeon: Algernon Huxley, MD;  Location:  [facility] [facility];  Service: Cardiovascular;  Laterality: N/A;  . INSERTION OF AHMED VALVE Left 12/17/2019   Procedure: INSERTION OF iSTENT;  Surgeon: Eulogio Bear, MD;  Location: [facility] ORS;  Service: Ophthalmology;  Laterality: Left;      Allergies  Allergen Reactions  . Shellfish Allergy Anaphylaxis    Face and throat swelling, difficulty breathing Allergy can be triggered by touching (contact)  . Betadine [Povidone Iodine] Rash    Current Outpatient Medications  Medication Sig Dispense Refill  . albuterol (PROVENTIL HFA;VENTOLIN HFA) 108 (90 Base) MCG/ACT inhaler Inhale 2 puffs into the lungs every 4 (four) hours as needed for wheezing or shortness of breath. 6.7 g 5  . allopurinol (ZYLOPRIM) 300 MG tablet Take 1 tablet (300 mg total) by mouth daily. 90 tablet 4  . amLODipine (NORVASC) 5 MG tablet Take 5 mg by mouth daily.    [name] ELLIPTA 62.5-25 MCG/INH AEPB INHALE 1 PUFF INTO THE LUNGS DAILY. (Patient taking differently: Inhale 1 puff into the lungs daily. ) 60 each 11  . aspirin EC 81 MG tablet Take 1 tablet (81 mg total) by mouth daily. 90 tablet 1  . atorvastatin (LIPITOR) 40 MG tablet Take 1 tablet (40 mg total) by mouth at bedtime. 90 tablet 3  . carvedilol (COREG) 25 MG tablet Take 1 tablet (25  mg total) by mouth every 12 (twelve) hours. 180 tablet 3  . Cholecalciferol (D3-1000) 25 MCG (1000 UT) tablet Take 2 tablets (2,000 Units total) by mouth daily. (Patient taking differently: Take 2,000 Units by mouth 2 (two) times daily. ) 60 tablet 3  . clobetasol ointment (TEMOVATE) 5.78 % Apply 1 application topically 2 (two) times daily.    . cyclobenzaprine (FLEXERIL) 5 MG tablet Take 1 tablet (5 mg total) by mouth 2 (two) times daily as needed for muscle spasms. 60 tablet 0  . Dulaglutide (TRULICITY) 1.5 IO/9.6EX SOPN Inject 1.5 mg into the skin every 7 (seven) days. Monday 3 pen 3  . DUREZOL 0.05 % EMUL Place 1 drop into the left eye 4 (four) times daily.    .  furosemide (LASIX) 40 MG tablet TAKE 1 & 1/2 TABLETS BY MOUTH TWICE DAILY 90 tablet 3  . gabapentin (NEURONTIN) 300 MG capsule TAKE (1) CAPSULE BY MOUTH TWICE DAILY. (Patient taking differently: Take 300 mg by mouth 2 (two) times daily. ) 56 capsule 1  . GAVILAX 17 GM/SCOOP powder Take 1 Container by mouth daily as needed for mild constipation or moderate constipation.     [name] Kitchen glimepiride (AMARYL) 4 MG tablet Take 4 mg by mouth daily.    . hydrALAZINE (APRESOLINE) 25 MG tablet TAKE 1 TABLET BY MOUTH TWICE DAILY FOR HIGH BLOOD PRESSURE (Patient taking differently: Take 25 mg by mouth daily. ) 56 tablet 11  . hydrocortisone cream 1 % Apply 1 application topically 2 (two) times daily.    . insulin degludec (TRESIBA FLEXTOUCH) 100 UNIT/ML SOPN FlexTouch Pen Inject 0.16 mLs (16 Units total) into the skin at bedtime. 9 pen 3  . isosorbide mononitrate (IMDUR) 60 MG 24 hr tablet TAKE 1 TABLET BY MOUTH ONCE DAILY. 30 tablet 3  . latanoprost (XALATAN) 0.005 % ophthalmic solution Place 1 drop into both eyes at bedtime.     . Lido-Capsaicin-Men-Methyl Sal (1ST MEDX-PATCH/ LIDOCAINE) 4-0.025-5-20 % PTCH Apply 1 patch topically daily. 10 patch 2  . magnesium oxide (MAG-OX) 400 MG tablet Take 400 mg by mouth daily.    . Magnesium Oxide 200 MG TABS Take 1 tablet (200 mg total) by mouth daily. 20 tablet 0  . metolazone (ZAROXOLYN) 5 MG tablet Take 1 tablet (5 mg total) by mouth daily. 30 tablet 1  . Multiple Vitamins-Minerals (TAB-A-VITE) TABS Take 1 tablet by mouth daily. 90 tablet 0  . multivitamin (RENA-VIT) TABS tablet Take 1 tablet by mouth at bedtime. 30 tablet 0  . omeprazole (PRILOSEC) 20 MG capsule Take 1 capsule (20 mg total) by mouth 2 (two) times daily before a meal. (Patient taking differently: Take 20 mg by mouth 2 (two) times daily. ) 180 capsule 2  . SKYRIZI, 150 MG DOSE, 75 MG/0.83ML PSKT Inject 300 mg into the skin See admin instructions. Inject 300 mg into the skin every 4 months    . traZODone  (DESYREL) 100 MG tablet Take one or one and half tab at night for sleep 45 tablet 0  . Triamcinolone Acetonide (TRIAMCINOLONE 0.1 % CREAM : EUCERIN) CREA Apply 1 application topically 2 (two) times daily. 1 each 5  . triamcinolone ointment (KENALOG) 0.5 % Apply 1 application topically 2 (two) times daily. 30 g 11  . VIGAMOX 0.5 % ophthalmic solution     . oxyCODONE-acetaminophen (PERCOCET) 5-325 MG tablet Take 1-2 tablets by mouth every 6 (six) hours as needed for moderate pain or severe pain. (Patient not taking: Reported on 02/01/2020)  40 tablet 0   No current facility-administered medications for this visit.        Physical Exam BP 114/69 (BP Location: Left Arm)   Pulse 84   Resp 12   Ht 5\' 3"  (1.6 m)   Wt 273 lb (123.8 kg)   BMI 48.36 kg/m  Gen:  WD/WN, NAD Skin: incision C/D/I, right arm brachiocephalic fistula with good thrill good bruit     Assessment/Plan:  1. End stage renal disease (HCC) The fistula is only 68 weeks old and is not ready to be used.  I will see him back in just 1 month at that time duplex ultrasound will be again obtained.  We will evaluate the fistula but also measure the depth and mark the fistula with a sharpie. - VAS US DUPLEX DIALYSIS ACCESS (AVF, AVG); Future      [name] 02/01/2020, 1:28 PM   This note was created with Dragon medical transcription system.  Any errors from dictation are unintentional.

## 2020-02-02 DIAGNOSIS — N186 End stage renal disease: Secondary | ICD-10-CM | POA: Diagnosis not present

## 2020-02-02 DIAGNOSIS — Z992 Dependence on renal dialysis: Secondary | ICD-10-CM | POA: Diagnosis not present

## 2020-02-02 DIAGNOSIS — D631 Anemia in chronic kidney disease: Secondary | ICD-10-CM | POA: Diagnosis not present

## 2020-02-02 DIAGNOSIS — N2581 Secondary hyperparathyroidism of renal origin: Secondary | ICD-10-CM | POA: Diagnosis not present

## 2020-02-03 DIAGNOSIS — Z992 Dependence on renal dialysis: Secondary | ICD-10-CM | POA: Diagnosis not present

## 2020-02-03 DIAGNOSIS — D631 Anemia in chronic kidney disease: Secondary | ICD-10-CM | POA: Diagnosis not present

## 2020-02-03 DIAGNOSIS — N2581 Secondary hyperparathyroidism of renal origin: Secondary | ICD-10-CM | POA: Diagnosis not present

## 2020-02-03 DIAGNOSIS — N186 End stage renal disease: Secondary | ICD-10-CM | POA: Diagnosis not present

## 2020-02-04 ENCOUNTER — Other Ambulatory Visit: Payer: Self-pay

## 2020-02-04 ENCOUNTER — Ambulatory Visit: Payer: Medicare Other | Admitting: Podiatry

## 2020-02-04 DIAGNOSIS — G4733 Obstructive sleep apnea (adult) (pediatric): Secondary | ICD-10-CM

## 2020-02-04 MED ORDER — TRAZODONE HCL 100 MG PO TABS: ORAL_TABLET | ORAL | 0 refills | Status: DC

## 2020-02-05 DIAGNOSIS — Z992 Dependence on renal dialysis: Secondary | ICD-10-CM | POA: Diagnosis not present

## 2020-02-05 DIAGNOSIS — D631 Anemia in chronic kidney disease: Secondary | ICD-10-CM | POA: Diagnosis not present

## 2020-02-05 DIAGNOSIS — N186 End stage renal disease: Secondary | ICD-10-CM | POA: Diagnosis not present

## 2020-02-05 DIAGNOSIS — N2581 Secondary hyperparathyroidism of renal origin: Secondary | ICD-10-CM | POA: Diagnosis not present

## 2020-02-08 DIAGNOSIS — N186 End stage renal disease: Secondary | ICD-10-CM | POA: Diagnosis not present

## 2020-02-08 DIAGNOSIS — D631 Anemia in chronic kidney disease: Secondary | ICD-10-CM | POA: Diagnosis not present

## 2020-02-08 DIAGNOSIS — Z992 Dependence on renal dialysis: Secondary | ICD-10-CM | POA: Diagnosis not present

## 2020-02-08 DIAGNOSIS — N2581 Secondary hyperparathyroidism of renal origin: Secondary | ICD-10-CM | POA: Diagnosis not present

## 2020-02-10 DIAGNOSIS — Z992 Dependence on renal dialysis: Secondary | ICD-10-CM | POA: Diagnosis not present

## 2020-02-10 DIAGNOSIS — D631 Anemia in chronic kidney disease: Secondary | ICD-10-CM | POA: Diagnosis not present

## 2020-02-10 DIAGNOSIS — N186 End stage renal disease: Secondary | ICD-10-CM | POA: Diagnosis not present

## 2020-02-10 DIAGNOSIS — N2581 Secondary hyperparathyroidism of renal origin: Secondary | ICD-10-CM | POA: Diagnosis not present

## 2020-02-11 ENCOUNTER — Other Ambulatory Visit: Payer: Self-pay

## 2020-02-11 ENCOUNTER — Ambulatory Visit (INDEPENDENT_AMBULATORY_CARE_PROVIDER_SITE_OTHER): Payer: Medicare Other | Admitting: Podiatry

## 2020-02-11 ENCOUNTER — Encounter: Payer: Self-pay | Admitting: Podiatry

## 2020-02-11 DIAGNOSIS — M79676 Pain in unspecified toe(s): Secondary | ICD-10-CM | POA: Diagnosis not present

## 2020-02-11 DIAGNOSIS — I872 Venous insufficiency (chronic) (peripheral): Secondary | ICD-10-CM

## 2020-02-11 DIAGNOSIS — B351 Tinea unguium: Secondary | ICD-10-CM

## 2020-02-11 DIAGNOSIS — E1142 Type 2 diabetes mellitus with diabetic polyneuropathy: Secondary | ICD-10-CM | POA: Diagnosis not present

## 2020-02-11 DIAGNOSIS — L409 Psoriasis, unspecified: Secondary | ICD-10-CM

## 2020-02-11 NOTE — Progress Notes (Signed)
Complaint:  Visit Type: Patient returns to my office for continued preventative foot care services. Complaint: Patient states" my nails have grown long and thick and become painful to walk and wear shoes" Patient has been diagnosed with DM with no foot complications. The patient presents for preventative foot care services. No changes to ROS.    Podiatric Exam: Vascular: dorsalis pedis and posterior tibial pulses are weakly palpable bilateral due to swelling. Capillary return is immediate. Temperature gradient is WNL. Skin turgor WNL Venous stasis  B/L. Sensorium: Normal Semmes Weinstein monofilament test. Normal tactile sensation bilaterally. Nail Exam: Pt has thick disfigured discolored nails with subungual debris noted bilateral entire nail hallux through fifth toenails Ulcer Exam: There is no evidence of ulcer or pre-ulcerative changes or infection. Orthopedic Exam: Muscle tone and strength are WNL. No limitations in general ROM. No crepitus or effusions noted. Foot type and digits show no abnormalities. Bony prominences are unremarkable. Skin: No Porokeratosis. No infection or ulcers.  Plantar punctate psoriasis localized to heels  B/L.  Diagnosis:  Onychomycosis, , Pain in right toe, pain in left toes  Treatment & Plan Procedures and Treatment: Consent by patient was obtained for treatment procedures.   Debridement of mycotic and hypertrophic toenails, 1 through 5 bilateral and clearing of subungual debris. No ulceration, no infection noted. Patient was given medicine by his dermatologist. Patient has received six shots from dermatologist for his psoriasis. Return Visit-Office Procedure: Patient instructed to return to the office for a follow up visit 10 weeks  for continued evaluation and treatment.    Gardiner Barefoot DPM

## 2020-02-12 ENCOUNTER — Other Ambulatory Visit: Payer: Self-pay

## 2020-02-12 DIAGNOSIS — D631 Anemia in chronic kidney disease: Secondary | ICD-10-CM | POA: Diagnosis not present

## 2020-02-12 DIAGNOSIS — N2581 Secondary hyperparathyroidism of renal origin: Secondary | ICD-10-CM | POA: Diagnosis not present

## 2020-02-12 DIAGNOSIS — Z992 Dependence on renal dialysis: Secondary | ICD-10-CM | POA: Diagnosis not present

## 2020-02-12 DIAGNOSIS — N186 End stage renal disease: Secondary | ICD-10-CM | POA: Diagnosis not present

## 2020-02-14 DIAGNOSIS — Z992 Dependence on renal dialysis: Secondary | ICD-10-CM | POA: Diagnosis not present

## 2020-02-14 DIAGNOSIS — N186 End stage renal disease: Secondary | ICD-10-CM | POA: Diagnosis not present

## 2020-02-15 ENCOUNTER — Encounter: Payer: Self-pay | Admitting: Ophthalmology

## 2020-02-15 DIAGNOSIS — D631 Anemia in chronic kidney disease: Secondary | ICD-10-CM | POA: Diagnosis not present

## 2020-02-15 DIAGNOSIS — N186 End stage renal disease: Secondary | ICD-10-CM | POA: Diagnosis not present

## 2020-02-15 DIAGNOSIS — D509 Iron deficiency anemia, unspecified: Secondary | ICD-10-CM | POA: Diagnosis not present

## 2020-02-15 DIAGNOSIS — Z992 Dependence on renal dialysis: Secondary | ICD-10-CM | POA: Diagnosis not present

## 2020-02-15 DIAGNOSIS — N2581 Secondary hyperparathyroidism of renal origin: Secondary | ICD-10-CM | POA: Diagnosis not present

## 2020-02-15 DIAGNOSIS — Z23 Encounter for immunization: Secondary | ICD-10-CM | POA: Diagnosis not present

## 2020-02-15 DIAGNOSIS — H2511 Age-related nuclear cataract, right eye: Secondary | ICD-10-CM | POA: Diagnosis not present

## 2020-02-17 DIAGNOSIS — D509 Iron deficiency anemia, unspecified: Secondary | ICD-10-CM | POA: Diagnosis not present

## 2020-02-17 DIAGNOSIS — N186 End stage renal disease: Secondary | ICD-10-CM | POA: Diagnosis not present

## 2020-02-17 DIAGNOSIS — Z23 Encounter for immunization: Secondary | ICD-10-CM | POA: Diagnosis not present

## 2020-02-17 DIAGNOSIS — N2581 Secondary hyperparathyroidism of renal origin: Secondary | ICD-10-CM | POA: Diagnosis not present

## 2020-02-17 DIAGNOSIS — Z992 Dependence on renal dialysis: Secondary | ICD-10-CM | POA: Diagnosis not present

## 2020-02-19 ENCOUNTER — Other Ambulatory Visit: Payer: Self-pay

## 2020-02-19 ENCOUNTER — Other Ambulatory Visit: Payer: Self-pay | Admitting: Adult Health

## 2020-02-19 DIAGNOSIS — Z23 Encounter for immunization: Secondary | ICD-10-CM | POA: Diagnosis not present

## 2020-02-19 DIAGNOSIS — N186 End stage renal disease: Secondary | ICD-10-CM | POA: Diagnosis not present

## 2020-02-19 DIAGNOSIS — Z992 Dependence on renal dialysis: Secondary | ICD-10-CM | POA: Diagnosis not present

## 2020-02-19 DIAGNOSIS — N2581 Secondary hyperparathyroidism of renal origin: Secondary | ICD-10-CM | POA: Diagnosis not present

## 2020-02-19 DIAGNOSIS — D509 Iron deficiency anemia, unspecified: Secondary | ICD-10-CM | POA: Diagnosis not present

## 2020-02-19 MED ORDER — OMEPRAZOLE 20 MG PO CPDR: 20.0000 mg | DELAYED_RELEASE_CAPSULE | Freq: Two times a day (BID) | ORAL | 2 refills | Status: DC

## 2020-02-22 ENCOUNTER — Other Ambulatory Visit
Admission: RE | Admit: 2020-02-22 | Discharge: 2020-02-22 | Disposition: A | Discharge status: Home / Self Care | Payer: Medicare Other | Source: Ambulatory Visit | Attending: Ophthalmology | Admitting: Ophthalmology

## 2020-02-22 DIAGNOSIS — Z01812 Encounter for preprocedural laboratory examination: Secondary | ICD-10-CM | POA: Diagnosis not present

## 2020-02-22 DIAGNOSIS — Z20822 Contact with and (suspected) exposure to covid-19: Secondary | ICD-10-CM | POA: Diagnosis not present

## 2020-02-23 DIAGNOSIS — Z992 Dependence on renal dialysis: Secondary | ICD-10-CM | POA: Diagnosis not present

## 2020-02-23 DIAGNOSIS — N2581 Secondary hyperparathyroidism of renal origin: Secondary | ICD-10-CM | POA: Diagnosis not present

## 2020-02-23 DIAGNOSIS — D509 Iron deficiency anemia, unspecified: Secondary | ICD-10-CM | POA: Diagnosis not present

## 2020-02-23 DIAGNOSIS — Z23 Encounter for immunization: Secondary | ICD-10-CM | POA: Diagnosis not present

## 2020-02-23 DIAGNOSIS — N186 End stage renal disease: Secondary | ICD-10-CM | POA: Diagnosis not present

## 2020-02-23 LAB — SARS CORONAVIRUS 2 (TAT 6-24 HRS): SARS Coronavirus 2: NEGATIVE

## 2020-02-24 ENCOUNTER — Ambulatory Visit: Payer: Medicare Other | Admitting: Certified Registered Nurse Anesthetist

## 2020-02-24 ENCOUNTER — Other Ambulatory Visit: Payer: Self-pay

## 2020-02-24 ENCOUNTER — Ambulatory Visit
Admission: RE | Admit: 2020-02-24 | Discharge: 2020-02-24 | Disposition: A | Discharge status: Home / Self Care | Payer: Medicare Other | Attending: Ophthalmology | Admitting: Ophthalmology

## 2020-02-24 ENCOUNTER — Encounter
Admission: RE | Disposition: A | Discharge status: Home / Self Care | Payer: Self-pay | Source: Home / Self Care | Attending: Ophthalmology

## 2020-02-24 ENCOUNTER — Encounter: Payer: Self-pay | Admitting: Ophthalmology

## 2020-02-24 DIAGNOSIS — Z79899 Other long term (current) drug therapy: Secondary | ICD-10-CM | POA: Diagnosis not present

## 2020-02-24 DIAGNOSIS — M1991 Primary osteoarthritis, unspecified site: Secondary | ICD-10-CM | POA: Insufficient documentation

## 2020-02-24 DIAGNOSIS — H401111 Primary open-angle glaucoma, right eye, mild stage: Secondary | ICD-10-CM | POA: Diagnosis not present

## 2020-02-24 DIAGNOSIS — N186 End stage renal disease: Secondary | ICD-10-CM | POA: Diagnosis not present

## 2020-02-24 DIAGNOSIS — J449 Chronic obstructive pulmonary disease, unspecified: Secondary | ICD-10-CM | POA: Insufficient documentation

## 2020-02-24 DIAGNOSIS — K219 Gastro-esophageal reflux disease without esophagitis: Secondary | ICD-10-CM | POA: Insufficient documentation

## 2020-02-24 DIAGNOSIS — L409 Psoriasis, unspecified: Secondary | ICD-10-CM | POA: Diagnosis not present

## 2020-02-24 DIAGNOSIS — G4733 Obstructive sleep apnea (adult) (pediatric): Secondary | ICD-10-CM | POA: Insufficient documentation

## 2020-02-24 DIAGNOSIS — E1139 Type 2 diabetes mellitus with other diabetic ophthalmic complication: Secondary | ICD-10-CM | POA: Diagnosis not present

## 2020-02-24 DIAGNOSIS — I472 Ventricular tachycardia: Secondary | ICD-10-CM | POA: Diagnosis not present

## 2020-02-24 DIAGNOSIS — I132 Hypertensive heart and chronic kidney disease with heart failure and with stage 5 chronic kidney disease, or end stage renal disease: Secondary | ICD-10-CM | POA: Insufficient documentation

## 2020-02-24 DIAGNOSIS — Z9981 Dependence on supplemental oxygen: Secondary | ICD-10-CM | POA: Insufficient documentation

## 2020-02-24 DIAGNOSIS — E1122 Type 2 diabetes mellitus with diabetic chronic kidney disease: Secondary | ICD-10-CM | POA: Insufficient documentation

## 2020-02-24 DIAGNOSIS — Z7982 Long term (current) use of aspirin: Secondary | ICD-10-CM | POA: Diagnosis not present

## 2020-02-24 DIAGNOSIS — I5032 Chronic diastolic (congestive) heart failure: Secondary | ICD-10-CM | POA: Diagnosis not present

## 2020-02-24 DIAGNOSIS — Z992 Dependence on renal dialysis: Secondary | ICD-10-CM | POA: Insufficient documentation

## 2020-02-24 DIAGNOSIS — E785 Hyperlipidemia, unspecified: Secondary | ICD-10-CM | POA: Diagnosis not present

## 2020-02-24 DIAGNOSIS — Z955 Presence of coronary angioplasty implant and graft: Secondary | ICD-10-CM | POA: Insufficient documentation

## 2020-02-24 DIAGNOSIS — M109 Gout, unspecified: Secondary | ICD-10-CM | POA: Diagnosis not present

## 2020-02-24 DIAGNOSIS — E7800 Pure hypercholesterolemia, unspecified: Secondary | ICD-10-CM | POA: Diagnosis not present

## 2020-02-24 DIAGNOSIS — E1136 Type 2 diabetes mellitus with diabetic cataract: Secondary | ICD-10-CM | POA: Insufficient documentation

## 2020-02-24 DIAGNOSIS — I509 Heart failure, unspecified: Secondary | ICD-10-CM | POA: Insufficient documentation

## 2020-02-24 DIAGNOSIS — H42 Glaucoma in diseases classified elsewhere: Secondary | ICD-10-CM | POA: Insufficient documentation

## 2020-02-24 DIAGNOSIS — I2510 Atherosclerotic heart disease of native coronary artery without angina pectoris: Secondary | ICD-10-CM | POA: Insufficient documentation

## 2020-02-24 DIAGNOSIS — Z794 Long term (current) use of insulin: Secondary | ICD-10-CM | POA: Diagnosis not present

## 2020-02-24 DIAGNOSIS — H2511 Age-related nuclear cataract, right eye: Secondary | ICD-10-CM | POA: Insufficient documentation

## 2020-02-24 DIAGNOSIS — Z8701 Personal history of pneumonia (recurrent): Secondary | ICD-10-CM | POA: Insufficient documentation

## 2020-02-24 HISTORY — PX: CATARACT EXTRACTION W/PHACO: SHX586

## 2020-02-24 LAB — POCT I-STAT, CHEM 8
BUN: 26 mg/dL — ABNORMAL HIGH (ref 6–20)
Calcium, Ion: 1.11 mmol/L — ABNORMAL LOW (ref 1.15–1.40)
Chloride: 99 mmol/L (ref 98–111)
Creatinine, Ser: 3.8 mg/dL — ABNORMAL HIGH (ref 0.61–1.24)
Glucose, Bld: 110 mg/dL — ABNORMAL HIGH (ref 70–99)
HCT: 38 % — ABNORMAL LOW (ref 39.0–52.0)
Hemoglobin: 12.9 g/dL — ABNORMAL LOW (ref 13.0–17.0)
Potassium: 3.9 mmol/L (ref 3.5–5.1)
Sodium: 138 mmol/L (ref 135–145)
TCO2: 30 mmol/L (ref 22–32)

## 2020-02-24 SURGERY — PHACOEMULSIFICATION, CATARACT, WITH IOL INSERTION
Anesthesia: Monitor Anesthesia Care | Site: Eye | Laterality: Right

## 2020-02-24 MED ORDER — POVIDONE-IODINE 5 % OP SOLN
OPHTHALMIC | Status: DC | PRN
  Administered 2020-02-24: 1 via OPHTHALMIC

## 2020-02-24 MED ORDER — MOXIFLOXACIN HCL 0.5 % OP SOLN
OPHTHALMIC | Status: DC | PRN
  Administered 2020-02-24: 0.2 mL via OPHTHALMIC

## 2020-02-24 MED ORDER — MOXIFLOXACIN HCL 0.5 % OP SOLN
OPHTHALMIC | Status: AC
  Filled 2020-02-24: qty 3

## 2020-02-24 MED ORDER — TETRACAINE HCL 0.5 % OP SOLN
OPHTHALMIC | Status: AC
  Administered 2020-02-24: 1 [drp] via OPHTHALMIC
  Filled 2020-02-24: qty 4

## 2020-02-24 MED ORDER — MIDAZOLAM HCL 2 MG/2ML IJ SOLN
INTRAMUSCULAR | Status: DC | PRN
  Administered 2020-02-24: 1 mg via INTRAVENOUS

## 2020-02-24 MED ORDER — ARMC OPHTHALMIC DILATING DROPS
OPHTHALMIC | Status: AC
  Administered 2020-02-24: 1 via OPHTHALMIC
  Filled 2020-02-24: qty 0.5

## 2020-02-24 MED ORDER — FENTANYL CITRATE (PF) 100 MCG/2ML IJ SOLN
INTRAMUSCULAR | Status: AC
  Filled 2020-02-24: qty 2

## 2020-02-24 MED ORDER — LIDOCAINE HCL (PF) 4 % IJ SOLN
INTRAOCULAR | Status: DC | PRN
  Administered 2020-02-24: 4 mL via OPHTHALMIC

## 2020-02-24 MED ORDER — EPINEPHRINE PF 1 MG/ML IJ SOLN: INTRAOCULAR | Status: DC | PRN

## 2020-02-24 MED ORDER — MOXIFLOXACIN HCL 0.5 % OP SOLN: 1.0000 [drp] | Freq: Once | OPHTHALMIC | Status: DC

## 2020-02-24 MED ORDER — POVIDONE-IODINE 5 % OP SOLN
OPHTHALMIC | Status: AC
  Filled 2020-02-24: qty 30

## 2020-02-24 MED ORDER — SODIUM CHLORIDE 0.9 % IV SOLN: INTRAVENOUS | Status: DC

## 2020-02-24 MED ORDER — SODIUM HYALURONATE 10 MG/ML IO SOLN
INTRAOCULAR | Status: DC | PRN
  Administered 2020-02-24: 0.55 mL via INTRAOCULAR

## 2020-02-24 MED ORDER — MIDAZOLAM HCL 2 MG/2ML IJ SOLN
INTRAMUSCULAR | Status: AC
  Filled 2020-02-24: qty 2

## 2020-02-24 MED ORDER — EPINEPHRINE PF 1 MG/ML IJ SOLN
INTRAMUSCULAR | Status: AC
  Filled 2020-02-24: qty 2

## 2020-02-24 MED ORDER — ARMC OPHTHALMIC DILATING DROPS
1.0000 "application " | OPHTHALMIC | Status: AC
  Administered 2020-02-24 (×2): 1 via OPHTHALMIC

## 2020-02-24 MED ORDER — DEXMEDETOMIDINE HCL IN NACL 80 MCG/20ML IV SOLN
INTRAVENOUS | Status: AC
  Filled 2020-02-24: qty 20

## 2020-02-24 MED ORDER — CARBACHOL 0.01 % IO SOLN
INTRAOCULAR | Status: DC | PRN
  Administered 2020-02-24: 0.5 mL via INTRAOCULAR

## 2020-02-24 MED ORDER — TETRACAINE HCL 0.5 % OP SOLN
1.0000 [drp] | Freq: Once | OPHTHALMIC | Status: AC
  Administered 2020-02-24: 1 [drp] via OPHTHALMIC

## 2020-02-24 MED ORDER — LIDOCAINE HCL (PF) 4 % IJ SOLN
INTRAMUSCULAR | Status: AC
  Filled 2020-02-24: qty 5

## 2020-02-24 MED ORDER — SODIUM HYALURONATE 23 MG/ML IO SOLN
INTRAOCULAR | Status: DC | PRN
  Administered 2020-02-24: 0.6 mL via INTRAOCULAR

## 2020-02-24 MED ORDER — DEXMEDETOMIDINE HCL 200 MCG/2ML IV SOLN
INTRAVENOUS | Status: DC | PRN
  Administered 2020-02-24: 8 ug via INTRAVENOUS
  Administered 2020-02-24: 4 ug via INTRAVENOUS

## 2020-02-24 SURGICAL SUPPLY — 20 items
DEVICE INJECT ISTENT W (Stent) IMPLANT
DISSECTOR HYDRO NUCLEUS 50X22 (MISCELLANEOUS) ×2 IMPLANT
GLOVE BIO SURGEON STRL SZ8 (GLOVE) ×2 IMPLANT
GLOVE BIOGEL M 6.5 STRL (GLOVE) ×2 IMPLANT
GLOVE SURG LX 7.5 STRW (GLOVE) ×1
GLOVE SURG LX STRL 7.5 STRW (GLOVE) ×1 IMPLANT
GOWN STRL REUS W/ TWL LRG LVL3 (GOWN DISPOSABLE) ×2 IMPLANT
GOWN STRL REUS W/TWL LRG LVL3 (GOWN DISPOSABLE) ×2
ICLIP (OPHTHALMIC RELATED) ×1 IMPLANT
INJECT ISTENT W (Stent) ×2 IMPLANT
LABEL CATARACT MEDS ST (LABEL) ×2 IMPLANT
LENS IOL DIOP 20.0 (Intraocular Lens) ×2 IMPLANT
LENS IOL TECNIS MONO 20.0 (Intraocular Lens) IMPLANT
PACK CATARACT (MISCELLANEOUS) ×2 IMPLANT
PACK CATARACT KING (MISCELLANEOUS) ×2 IMPLANT
PACK EYE AFTER SURG (MISCELLANEOUS) ×2 IMPLANT
SOL BSS BAG (MISCELLANEOUS) ×2
SOLUTION BSS BAG (MISCELLANEOUS) ×1 IMPLANT
WATER STERILE IRR 250ML POUR (IV SOLUTION) ×2 IMPLANT
WIPE NON LINTING 3.25X3.25 (MISCELLANEOUS) ×2 IMPLANT

## 2020-02-24 NOTE — H&P (Signed)

## 2020-02-24 NOTE — Anesthesia Preprocedure Evaluation (Signed)
Anesthesia Evaluation  Patient identified by MRN, date of birth, ID band Patient awake    Reviewed: Allergy & Precautions, NPO status , Patient's Chart, lab work & pertinent test results  History of Anesthesia Complications Negative for: history of anesthetic complications  Airway Mallampati: III  TM Distance: >3 FB Neck ROM: Full    Dental no notable dental hx.    Pulmonary sleep apnea and Continuous Positive Airway Pressure Ventilation , COPD,  COPD inhaler,    breath sounds clear to auscultation- rhonchi (-) wheezing      Cardiovascular hypertension, + CAD, + Cardiac Stents (2016), +CHF and + Orthopnea   Rhythm:Regular Rate:Normal - Systolic murmurs and - Diastolic murmurs    Neuro/Psych neg Seizures negative neurological ROS  negative psych ROS   GI/Hepatic Neg liver ROS, GERD  ,  Endo/Other  diabetes, Insulin Dependent  Renal/GU ESRF and DialysisRenal disease     Musculoskeletal  (+) Arthritis ,   Abdominal (+) + obese,   Peds  Hematology negative hematology ROS (+)   Anesthesia Other Findings Past Medical History: No date: Bell's palsy No date: Bell's palsy No date: Cataract No date: CHF (congestive heart failure) (HCC) No date: Chronic kidney disease     Comment:  dialysis No date: COPD (chronic obstructive pulmonary disease) (HCC) No date: Coronary artery disease No date: Diabetes mellitus without complication (HCC) No date: Dyspnea     Comment:  Wheezing No date: GERD (gastroesophageal reflux disease) No date: Gout No date: HOH (hard of hearing)     Comment:  mild No date: Hypercholesterolemia No date: Hypertension No date: NSVT (nonsustained ventricular tachycardia) (HCC) No date: Obstructive sleep apnea     Comment:  CPAP, O2 use continuosly at 3LPM No date: Orthopnea No date: Oxygen dependent     Comment:  3 lpm continuous No date: Pneumonia     Comment:  In Past X2 most recent 1 yr  ago No date: Psoriasis No date: Psoriasis No date: Pulmonary HTN (HCC) No date: Renal failure   Reproductive/Obstetrics                             Anesthesia Physical Anesthesia Plan  ASA: IV  Anesthesia Plan: MAC   Post-op Pain Management:    Induction: Intravenous  PONV Risk Score and Plan: 1 and Midazolam  Airway Management Planned: Natural Airway  Additional Equipment:   Intra-op Plan:   Post-operative Plan:   Informed Consent: I have reviewed the patients History and Physical, chart, labs and discussed the procedure including the risks, benefits and alternatives for the proposed anesthesia with the patient or authorized representative who has indicated his/her understanding and acceptance.       Plan Discussed with: CRNA and Anesthesiologist  Anesthesia Plan Comments:         Anesthesia Quick Evaluation

## 2020-02-24 NOTE — Transfer of Care (Signed)
Immediate Anesthesia Transfer of Care Note  Patient: [name]  Procedure(s) Performed: CATARACT EXTRACTION PHACO AND INTRAOCULAR LENS PLACEMENT (IOC) RIGHT DIABETIC ISTENT INJ (Right Eye)  Patient Location: PACU  Anesthesia Type:MAC  Level of Consciousness: awake, alert  and oriented  Airway & Oxygen Therapy: Patient Spontanous Breathing  Post-op Assessment: Report given to RN and Post -op Vital signs reviewed and stable  Post vital signs: Reviewed and stable  Last Vitals:  Vitals Value Taken Time  BP    Temp    Pulse    Resp    SpO2      Last Pain:  Vitals:   02/24/20 0615  TempSrc: Tympanic  PainSc:          Complications: No apparent anesthesia complications

## 2020-02-24 NOTE — Op Note (Signed)
OPERATIVE NOTE  [name] [id number] 02/24/2020  PREOPERATIVE DIAGNOSIS:   1.  Mild PRIMARY open angle glaucoma, right eye. H40.1111  2.  Nuclear sclerotic cataract right eye.  H25.11   POSTOPERATIVE DIAGNOSIS:    same.   PROCEDURE:   1.  Placement of trabecular bypass stent (istent). CPT 0191T  and placement of additional stent  CPT 0376T 2.  Phacoemusification with posterior chamber intraocular lens placement of the right eye  CPT [phone]   LENS: Implant Name Type Inv. Item Serial No. Manufacturer Lot No. LRB No. Used Action  LENS IOL DIOP 20.0 - [id number] Intraocular Lens LENS IOL DIOP 20.0 [id number]  Right 1 Implanted  [name] - [id number] Stent [name] [id number] [employer] [id number] Right 1 Implanted      Procedure(s) with comments: CATARACT EXTRACTION PHACO AND INTRAOCULAR LENS PLACEMENT (IOC) RIGHT DIABETIC ISTENT INJ (Right) - [place] 00:37.3 CDE 2.88 Fluid Pack Lot # [id number]  DCB00+20.0  ULTRASOUND TIME: 0 minutes 37 seconds.  CDE 2.88   SURGEON:  Benay Pillow, MD, MPH  ANESTHESIOLOGIST: Anesthesiologist: Emmie Niemann, MD CRNA: Caryl Asp, CRNA   ANESTHESIA:  MAC and intracameral preservative-free lidocaine 4%.  ESTIMATED BLOOD LOSS: less than 1 mL.   COMPLICATIONS:  None.   DESCRIPTION OF PROCEDURE:  The patient was identified in the holding room and transported to the operating room.   The patient was placed in the supine position under the operating microscope.  The right eye was prepped and draped in the usual sterile ophthalmic fashion.   A 1.0 millimeter clear-corneal paracentesis was made at the 10:30 position. 0.5 ml of preservative-free 1% lidocaine with epinephrine was injected into the anterior chamber.  The anterior chamber was filled with Healon 5 viscoelastic.  A 2.4 millimeter keratome was used to make a near-clear corneal incision at the 8:00 position.   Attention was turned to the istent.  The  patients head was turned to the left and the microscope was tilted to 035 degrees.  Ocular instruments/[employer] OAL/H2 gonioprism was used with IPC05 (iclip) coupled with Healon 5 on the cornea was used to visualize the trabecular meshwork. The istent was opened and introduced into the eye.  The meshwork was engaged with the tip of the iStent injector and the stent was deployed into Schlemm's canal at 2:00.  The second stent was deployed at 4:00.  The stents were well seated and in good position.  Next, attention was turned to the phacoemulsification A curvilinear capsulorrhexis was made with a cystotome and capsulorrhexis forceps.  Balanced salt solution was used to hydrodissect and hydrodelineate the nucleus.   Phacoemulsification was then used in stop and chop fashion to remove the lens nucleus and epinucleus.  The remaining cortex was then removed using the irrigation and aspiration handpiece. Healon was then placed into the capsular bag to distend it for lens placement.  A lens was then injected into the capsular bag.  The remaining viscoelastic was aspirated.   Wounds were hydrated with balanced salt solution.  The anterior chamber was inflated to a physiologic pressure with balanced salt solution.   Intracameral vigamox 0.1 mL undiluted was injected into the eye and a drop placed onto the ocular surface.  No wound leaks were noted. The patient was taken to the recovery room in stable condition without complications of anesthesia or surgery   Benay Pillow 02/24/2020, 8:05 AM

## 2020-02-24 NOTE — Anesthesia Postprocedure Evaluation (Signed)
Anesthesia Post Note  Patient: [name]  Procedure(s) Performed: CATARACT EXTRACTION PHACO AND INTRAOCULAR LENS PLACEMENT (IOC) RIGHT DIABETIC ISTENT INJ (Right Eye)  Patient location during evaluation: PACU Anesthesia Type: MAC Level of consciousness: awake and alert and oriented Pain management: pain level controlled Vital Signs Assessment: post-procedure vital signs reviewed and stable Respiratory status: spontaneous breathing, nonlabored ventilation and respiratory function stable Cardiovascular status: blood pressure returned to baseline and stable Postop Assessment: no signs of nausea or vomiting Anesthetic complications: no     Last Vitals:  Vitals:   02/24/20 0615 02/24/20 0815  BP: 125/79 (!) 135/91  Pulse: 80 77  Resp: 16 14  Temp: (!) 36.4 C (!) 36.2 C  SpO2: 100% 99%    Last Pain:  Vitals:   02/24/20 0815  TempSrc: Temporal  PainSc: 0-No pain                 Amy Penwarden

## 2020-02-24 NOTE — Discharge Instructions (Signed)
Eye Surgery Discharge Instructions    Expect mild scratchy sensation or mild soreness. DO NOT RUB YOUR EYE!  The day of surgery: . Minimal physical activity, but bed rest is not required . No reading, computer work, or close hand work . No bending, lifting, or straining. . May watch TV  For 24 hours: . No driving, legal decisions, or alcoholic beverages . Safety precautions . Eat anything you prefer: It is better to start with liquids, then soup then solid foods. . _____ Eye patch should be worn until postoperative exam tomorrow. . ____ Solar shield eyeglasses should be worn for comfort in the sunlight/patch while sleeping  Resume all regular medications including aspirin or Coumadin if these were discontinued prior to surgery. You may shower, bathe, shave, or wash your hair. Tylenol may be taken for mild discomfort.  Call your doctor if you experience significant pain, nausea, or vomiting, fever > 101 or other signs of infection. [phone] or [phone] Specific instructions:  Follow-up Information    Eulogio Bear, MD Follow up on 02/25/2020.   Specialty: Ophthalmology Why: appointment time @ 9:50 AM Contact information: [address] [place] [address] [phone]

## 2020-02-25 DIAGNOSIS — Z23 Encounter for immunization: Secondary | ICD-10-CM | POA: Diagnosis not present

## 2020-02-25 DIAGNOSIS — D509 Iron deficiency anemia, unspecified: Secondary | ICD-10-CM | POA: Diagnosis not present

## 2020-02-25 DIAGNOSIS — N2581 Secondary hyperparathyroidism of renal origin: Secondary | ICD-10-CM | POA: Diagnosis not present

## 2020-02-25 DIAGNOSIS — Z992 Dependence on renal dialysis: Secondary | ICD-10-CM | POA: Diagnosis not present

## 2020-02-25 DIAGNOSIS — N186 End stage renal disease: Secondary | ICD-10-CM | POA: Diagnosis not present

## 2020-02-26 ENCOUNTER — Telehealth: Payer: Self-pay

## 2020-02-26 NOTE — Telephone Encounter (Signed)
Patient uses [facility] for oxygen and he has been advised to call them for tank service. [phone]. beth

## 2020-02-27 DIAGNOSIS — N2581 Secondary hyperparathyroidism of renal origin: Secondary | ICD-10-CM | POA: Diagnosis not present

## 2020-02-27 DIAGNOSIS — Z992 Dependence on renal dialysis: Secondary | ICD-10-CM | POA: Diagnosis not present

## 2020-02-27 DIAGNOSIS — D509 Iron deficiency anemia, unspecified: Secondary | ICD-10-CM | POA: Diagnosis not present

## 2020-02-27 DIAGNOSIS — Z23 Encounter for immunization: Secondary | ICD-10-CM | POA: Diagnosis not present

## 2020-02-27 DIAGNOSIS — N186 End stage renal disease: Secondary | ICD-10-CM | POA: Diagnosis not present

## 2020-02-29 DIAGNOSIS — Z23 Encounter for immunization: Secondary | ICD-10-CM | POA: Diagnosis not present

## 2020-02-29 DIAGNOSIS — N186 End stage renal disease: Secondary | ICD-10-CM | POA: Diagnosis not present

## 2020-02-29 DIAGNOSIS — Z992 Dependence on renal dialysis: Secondary | ICD-10-CM | POA: Diagnosis not present

## 2020-02-29 DIAGNOSIS — N2581 Secondary hyperparathyroidism of renal origin: Secondary | ICD-10-CM | POA: Diagnosis not present

## 2020-02-29 DIAGNOSIS — D509 Iron deficiency anemia, unspecified: Secondary | ICD-10-CM | POA: Diagnosis not present

## 2020-03-02 DIAGNOSIS — D509 Iron deficiency anemia, unspecified: Secondary | ICD-10-CM | POA: Diagnosis not present

## 2020-03-02 DIAGNOSIS — N2581 Secondary hyperparathyroidism of renal origin: Secondary | ICD-10-CM | POA: Diagnosis not present

## 2020-03-02 DIAGNOSIS — Z992 Dependence on renal dialysis: Secondary | ICD-10-CM | POA: Diagnosis not present

## 2020-03-02 DIAGNOSIS — N186 End stage renal disease: Secondary | ICD-10-CM | POA: Diagnosis not present

## 2020-03-02 DIAGNOSIS — Z23 Encounter for immunization: Secondary | ICD-10-CM | POA: Diagnosis not present

## 2020-03-04 ENCOUNTER — Telehealth: Payer: Self-pay

## 2020-03-04 DIAGNOSIS — N186 End stage renal disease: Secondary | ICD-10-CM | POA: Diagnosis not present

## 2020-03-04 DIAGNOSIS — D509 Iron deficiency anemia, unspecified: Secondary | ICD-10-CM | POA: Diagnosis not present

## 2020-03-04 DIAGNOSIS — N2581 Secondary hyperparathyroidism of renal origin: Secondary | ICD-10-CM | POA: Diagnosis not present

## 2020-03-04 DIAGNOSIS — Z23 Encounter for immunization: Secondary | ICD-10-CM | POA: Diagnosis not present

## 2020-03-04 DIAGNOSIS — Z992 Dependence on renal dialysis: Secondary | ICD-10-CM | POA: Diagnosis not present

## 2020-03-04 NOTE — Telephone Encounter (Signed)
CHRIS FROM [facility] CALLED ME BACK AND STATED THAT THEY WENT TO PT HOME Wednesday TO CHECK THE PORTABLE OXYGEN CONCENTRATOR BECAUSE PT WAS COMPLAINING THAT POC WAS NOT WORKING AND PT FELT LIKE HE WAS NOT GETTING O2 FROM NASAL CANNULA. CHRIS SAID THAT THE POC IS PUSHING, TRIGGERING, AND THERE IS A REGULATION OF OXYGEN FLOW. PT WAS ALSO OFFERED A TANK BEFORE POC BECAUSE TANKS ARE EASIER TO REPLACE THAN THE POC. PT PREFERRED POC. CHRIS ALSO STATES THAT WHEN THEY WERE CHECKING PT POC WHEN PT PUT THE NASAL CANNULA UP TO HIS EAR HE FELT THE AIR MOVING BUT CLAIMED TO NOT FEEL IT WHEN PUTTING THE CANNULA INTO HIS NOSE. CHRIS ALSO STATES PT PRESCRIBED WITH 2 LITERS OF OXYGEN BUT HAS THE POC ON 3 LITERS AND PER OUR PAST RECORDS HAS ALWAYS CAME IN WITH 3 LITERS OF OXYGEN.  CALLED PT AND INFORMED PT THAT [facility] DID EVERYTHING PER THEIR PROTOCOL AND THE POC IS WORKING AS IT SHOULD. PT SAID THAT STILL HE IS UNABLE TO BREATHE OUT OF THE CANNULA.  SPOKE WITH BETH ABOUT THIS AND PT SAID TO BETH ALL HE WANTS TO DO IS SWITCH IS O2 COMPANIES AND DOES NOT SEE WHY HE CANNOT DO THAT. PT IS SCHEDULED FOR AN APPT TO BE SEEN ABOUT THIS CONCERN.

## 2020-03-04 NOTE — Telephone Encounter (Signed)
Pt called stating that he is not getting enough oxygen through his portable oxygen. Pt states he had [facility] come to his home to check his machine out and pt said that [facility] stated that the tech who takes care of fixing machines is not here and will have to come out again to look at machine and if there are any issues they can take care of them. Pt states that he feels like this process is going to be around 2 weeks and pt states he would like another company to receive his oxygen because pt cannot wait that long.  Spoke with beth and beth said that he uses [facility] for this supplies. Called [facility] and the only supplies they provide pt with is home vent and cpap. After clarifying with [facility] called [facility] to ask if they handle his portable oxygen and if there is any update of issues regarding his portable oxygen.  [facility] confirmed with me that they did have someone go out to pt home to check portable oxygen and when evaluated it there machine seemed to be working fine. [facility] will have service rep to call pt to look further into this. [facility] also clarified with me that pt states he is not getting enough oxygen and since the oxygen machine has been looked at it may have to do with adjusting the liters that pt is receiving.  I asked [facility] to call me to follow up after the service rep calls pt about portable oxygen. If there is nothing wrong with the machine and pt still feels like he is not receiving enough oxygen then pt will need to be seen to see if there are any adjustments that need to be made. [facility] will call back to follow up.

## 2020-03-07 DIAGNOSIS — N2581 Secondary hyperparathyroidism of renal origin: Secondary | ICD-10-CM | POA: Diagnosis not present

## 2020-03-07 DIAGNOSIS — E119 Type 2 diabetes mellitus without complications: Secondary | ICD-10-CM | POA: Diagnosis not present

## 2020-03-07 DIAGNOSIS — Z23 Encounter for immunization: Secondary | ICD-10-CM | POA: Diagnosis not present

## 2020-03-07 DIAGNOSIS — N186 End stage renal disease: Secondary | ICD-10-CM | POA: Diagnosis not present

## 2020-03-07 DIAGNOSIS — D509 Iron deficiency anemia, unspecified: Secondary | ICD-10-CM | POA: Diagnosis not present

## 2020-03-07 DIAGNOSIS — Z992 Dependence on renal dialysis: Secondary | ICD-10-CM | POA: Diagnosis not present

## 2020-03-08 ENCOUNTER — Telehealth: Payer: Self-pay

## 2020-03-08 NOTE — Telephone Encounter (Signed)
Confirmed appointment on 03/10/2020 and screened for covid. [facility]  °

## 2020-03-09 DIAGNOSIS — Z992 Dependence on renal dialysis: Secondary | ICD-10-CM | POA: Diagnosis not present

## 2020-03-09 DIAGNOSIS — T829XXA Unspecified complication of cardiac and vascular prosthetic device, implant and graft, initial encounter: Secondary | ICD-10-CM | POA: Insufficient documentation

## 2020-03-09 DIAGNOSIS — N2581 Secondary hyperparathyroidism of renal origin: Secondary | ICD-10-CM | POA: Diagnosis not present

## 2020-03-09 DIAGNOSIS — Z23 Encounter for immunization: Secondary | ICD-10-CM | POA: Diagnosis not present

## 2020-03-09 DIAGNOSIS — D509 Iron deficiency anemia, unspecified: Secondary | ICD-10-CM | POA: Diagnosis not present

## 2020-03-09 DIAGNOSIS — N186 End stage renal disease: Secondary | ICD-10-CM | POA: Diagnosis not present

## 2020-03-09 NOTE — Progress Notes (Signed)
MRN : [id number]  [name] is a 54 y.o. (1966/01/17) male who presents with chief complaint of No chief complaint on file. [name] Kitchen  History of Present Illness:    The patient is seen for evaluation of dialysis access.  The patient has a history of multiple failed accesses.  There have been accesses in both arms and in the thighs.    Current access is via a catheter which is functioning poorly.  There have not been any episodes of catheter infection.  The patient denies amaurosis fugax or recent TIA symptoms. There are no recent neurological changes noted. The patient denies claudication symptoms or rest pain symptoms. The patient denies history of DVT, PE or superficial thrombophlebitis. The patient denies recent episodes of angina or shortness of breath.   Duplex ultrasound of the AV access shows a patent access.  The previously noted stenosis is unchanged compared to last study.   No outpatient medications have been marked as taking for the 03/10/20 encounter (Appointment) with Delana Meyer, Dolores Lory, MD.    Past Medical History:  Diagnosis Date  . Bell's palsy   . Bell's palsy   . Cataract   . CHF (congestive heart failure) ([place])   . Chronic kidney disease    dialysis  . COPD (chronic obstructive pulmonary disease) ([place])   . Coronary artery disease   . Diabetes mellitus without complication ([place])   . Dyspnea    Wheezing  . GERD (gastroesophageal reflux disease)   . Gout   . HOH (hard of hearing)    mild  . Hypercholesterolemia   . Hypertension   . NSVT (nonsustained ventricular tachycardia) ([place])   . Obstructive sleep apnea    CPAP, O2 use continuosly at 3LPM  . Orthopnea   . Oxygen dependent    3 lpm continuous  . Pneumonia    In Past X2 most recent 1 yr ago  . Psoriasis   . Psoriasis   . Pulmonary HTN ([place])   . Renal failure     Past Surgical History:  Procedure Laterality Date  . AV FISTULA PLACEMENT Right 11/20/2019   Procedure: ARTERIOVENOUS (AV)  FISTULA CREATION ( BRACHIAL CEPHALIC );  Surgeon: Katha Cabal, MD;  Location: [facility];  Service: Vascular;  Laterality: Right;  . CATARACT EXTRACTION W/PHACO Left 12/17/2019   Procedure: CATARACT EXTRACTION PHACO AND INTRAOCULAR LENS PLACEMENT ([place]) LEFT ISTENT INJ DIABETIC;  Surgeon: Eulogio Bear, MD;  Location: [facility];  Service: Ophthalmology;  Laterality: Left;  [place] 00:33.2 CDE 2.96 Fluid Pack Lot # [id number]  . CATARACT EXTRACTION W/PHACO Right 02/24/2020   Procedure: CATARACT EXTRACTION PHACO AND INTRAOCULAR LENS PLACEMENT (IOC) RIGHT DIABETIC ISTENT INJ;  Surgeon: Eulogio Bear, MD;  Location: [facility];  Service: Ophthalmology;  Laterality: Right;  [place] 00:37.3 CDE 2.88 Fluid Pack Lot # [id number]  . CORONARY ANGIOPLASTY WITH STENT PLACEMENT     stent placement  . DIALYSIS/PERMA CATHETER INSERTION N/A 06/22/2019   Procedure: DIALYSIS/PERMA CATHETER INSERTION;  Surgeon: Algernon Huxley, MD;  Location: [place] [facility];  Service: Cardiovascular;  Laterality: N/A;  . INSERTION OF AHMED VALVE Left 12/17/2019   Procedure: INSERTION OF iSTENT;  Surgeon: Eulogio Bear, MD;  Location: [facility];  Service: Ophthalmology;  Laterality: Left;    Social History Social History   Tobacco Use  . Smoking status: Never Smoker  . Smokeless tobacco: Never Used  Substance Use Topics  . Alcohol use: Not Currently    Alcohol/week: 1.0  standard drinks    Types: 1 Cans of beer per week    Comment: occasional  . Drug use: Not Currently    Types: Marijuana    Comment: in high school     Family History Family History  Problem Relation Age of Onset  . Heart failure Mother   . Kidney failure Brother     Allergies  Allergen Reactions  . Shellfish Allergy Anaphylaxis    Face and throat swelling, difficulty breathing Allergy can be triggered by touching (contact)  . Betadine [Povidone Iodine] Rash     REVIEW OF SYSTEMS (Negative unless checked)  Constitutional: [] Weight loss   [] Fever  [] Chills Cardiac: [] Chest pain   [] Chest pressure   [] Palpitations   [] Shortness of breath when laying flat   [] Shortness of breath with exertion. Vascular:  [] Pain in legs with walking   [] Pain in legs at rest  [] History of DVT   [] Phlebitis   [] Swelling in legs   [] Varicose veins   [] Non-healing ulcers Pulmonary:   [] Uses home oxygen   [] Productive cough   [] Hemoptysis   [] Wheeze  [] COPD   [] Asthma Neurologic:  [] Dizziness   [] Seizures   [] History of stroke   [] History of TIA  [] Aphasia   [] Vissual changes   [] Weakness or numbness in arm   [] Weakness or numbness in leg Musculoskeletal:   [] Joint swelling   [] Joint pain   [] Low back pain Hematologic:  [] Easy bruising  [] Easy bleeding   [] Hypercoagulable state   [] Anemic Gastrointestinal:  [] Diarrhea   [] Vomiting  [] Gastroesophageal reflux/heartburn   [] Difficulty swallowing. Genitourinary:  [x] Chronic kidney disease   [] Difficult urination  [] Frequent urination   [] Blood in urine Skin:  [] Rashes   [] Ulcers  Psychological:  [] History of anxiety   []  History of major depression.  Physical Examination  There were no vitals filed for this visit. There is no height or weight on file to calculate BMI. Gen: WD/WN, NAD Head: [place]/AT, No temporalis wasting.  Ear/Nose/Throat: Hearing grossly intact, nares w/o erythema or drainage Eyes: PER, EOMI, sclera nonicteric.  Neck: Supple, no large masses.   Pulmonary:  Good air movement, no audible wheezing bilaterally, no use of accessory muscles.  Cardiac: RRR, no JVD Vascular:  Right ar fistula good thrill good bruit Vessel Right Left  Radial Palpable Palpable  Brachial Palpable Palpable  Gastrointestinal: Non-distended. No guarding/no peritoneal signs.  Musculoskeletal: M/S 5/5 throughout.  No deformity or atrophy.  Neurologic: CN 2-12 intact. Symmetrical.  Speech is fluent. Motor exam as listed above. Psychiatric: Judgment intact, Mood & affect appropriate for pt's clinical  situation. Dermatologic: No rashes or ulcers noted.  No changes consistent with cellulitis.   CBC Lab Results  Component Value Date   WBC 14.2 (H) 11/21/2019   HGB 12.9 (L) 02/24/2020   HCT 38.0 (L) 02/24/2020   MCV 83.2 11/21/2019   PLT 207 11/21/2019    BMET    Component Value Date/Time   NA 138 02/24/2020 0635   NA 142 04/21/2019 1334   K 3.9 02/24/2020 0635   CL 99 02/24/2020 0635   CO2 22 11/21/2019 1853   GLUCOSE 110 (H) 02/24/2020 0635   BUN 26 (H) 02/24/2020 0635   BUN 21 04/21/2019 1334   CREATININE 3.80 (H) 02/24/2020 0635   CALCIUM 8.6 (L) 11/21/2019 1853   GFRNONAA 9 (L) 11/21/2019 1853   GFRAA 10 (L) 11/21/2019 1853   CrCl cannot be calculated (Unknown ideal weight.).  COAG Lab Results  Component Value Date  INR 1.1 11/17/2019   INR 1.1 06/15/2019   INR 1.1 02/18/2019    Radiology No results found.   Assessment/Plan 1. Complication from renal dialysis device, sequela Recommend:  The patient is doing well and currently has adequate dialysis access. The patient's dialysis center is not reporting any access issues. Flow pattern is stable when compared to the prior ultrasound.  The patient should have a duplex ultrasound of the dialysis access in 3 months. The patient will follow-up with me in the office after each ultrasound     2. End stage renal disease ([place]) At the present time the patient has adequate dialysis access.  Continue hemodialysis as ordered without interruption.  Avoid nephrotoxic medications and dehydration.  Further plans per nephrology  3. Coronary artery disease involving native coronary artery of native heart without angina pectoris Continue cardiac and antihypertensive medications as already ordered and reviewed, no changes at this time.  Continue statin as ordered and reviewed, no changes at this time  Nitrates PRN for chest pain   4. Essential hypertension Continue antihypertensive medications as already  ordered, these medications have been reviewed and there are no changes at this time.   5. Type 2 diabetes mellitus with hyperglycemia, with long-term current use of insulin (HCC) Continue hypoglycemic medications as already ordered, these medications have been reviewed and there are no changes at this time.  Hgb A1C to be monitored as already arranged by primary service     [name], MD  03/09/2020 11:45 AM

## 2020-03-10 ENCOUNTER — Ambulatory Visit (INDEPENDENT_AMBULATORY_CARE_PROVIDER_SITE_OTHER): Payer: Medicare Other

## 2020-03-10 ENCOUNTER — Encounter: Payer: Self-pay | Admitting: Internal Medicine

## 2020-03-10 ENCOUNTER — Ambulatory Visit (INDEPENDENT_AMBULATORY_CARE_PROVIDER_SITE_OTHER): Payer: Medicare Other | Admitting: Vascular Surgery

## 2020-03-10 ENCOUNTER — Encounter (INDEPENDENT_AMBULATORY_CARE_PROVIDER_SITE_OTHER): Payer: Self-pay | Admitting: Vascular Surgery

## 2020-03-10 ENCOUNTER — Other Ambulatory Visit: Payer: Self-pay

## 2020-03-10 ENCOUNTER — Ambulatory Visit (INDEPENDENT_AMBULATORY_CARE_PROVIDER_SITE_OTHER): Payer: Medicare Other | Admitting: Internal Medicine

## 2020-03-10 VITALS — BP 121/74 | HR 83 | Temp 97.3°F | Resp 16 | Ht 63.0 in | Wt 267.0 lb

## 2020-03-10 VITALS — BP 113/74 | HR 87 | Ht 63.0 in | Wt 267.0 lb

## 2020-03-10 DIAGNOSIS — T829XXS Unspecified complication of cardiac and vascular prosthetic device, implant and graft, sequela: Secondary | ICD-10-CM

## 2020-03-10 DIAGNOSIS — N186 End stage renal disease: Secondary | ICD-10-CM

## 2020-03-10 DIAGNOSIS — Z794 Long term (current) use of insulin: Secondary | ICD-10-CM

## 2020-03-10 DIAGNOSIS — E1165 Type 2 diabetes mellitus with hyperglycemia: Secondary | ICD-10-CM

## 2020-03-10 DIAGNOSIS — I2510 Atherosclerotic heart disease of native coronary artery without angina pectoris: Secondary | ICD-10-CM

## 2020-03-10 DIAGNOSIS — J4540 Moderate persistent asthma, uncomplicated: Secondary | ICD-10-CM

## 2020-03-10 DIAGNOSIS — Z9981 Dependence on supplemental oxygen: Secondary | ICD-10-CM

## 2020-03-10 DIAGNOSIS — G4733 Obstructive sleep apnea (adult) (pediatric): Secondary | ICD-10-CM | POA: Diagnosis not present

## 2020-03-10 DIAGNOSIS — I10 Essential (primary) hypertension: Secondary | ICD-10-CM

## 2020-03-10 DIAGNOSIS — Z6841 Body Mass Index (BMI) 40.0 and over, adult: Secondary | ICD-10-CM

## 2020-03-10 NOTE — Progress Notes (Signed)
[facility] [place], [place] [address]  Pulmonary Sleep Medicine   Office Visit Note  Patient Name: [name] DOB: 07/09/1966 MRN [id number]  Date of Service: 03/10/2020  Complaints/HPI: Pt is here for acute visit.  He reports he has been having difficulty getting oxygen in a timely manor from his current oxygen company.  The current company only comes to [place] certain days. This make it difficult to keep his oxygen supply adequately stocked. Today in office his is 88% o2 sat on room air at rest.    ROS  General: (-) fever, (-) chills, (-) night sweats, (-) weakness Skin: (-) rashes, (-) itching,. Eyes: (-) visual changes, (-) redness, (-) itching. Nose and Sinuses: (-) nasal stuffiness or itchiness, (-) postnasal drip, (-) nosebleeds, (-) sinus trouble. Mouth and Throat: (-) sore throat, (-) hoarseness. Neck: (-) swollen glands, (-) enlarged thyroid, (-) neck pain. Respiratory: - cough, (-) bloody sputum, - shortness of breath, - wheezing. Cardiovascular: - ankle swelling, (-) chest pain. Lymphatic: (-) lymph node enlargement. Neurologic: (-) numbness, (-) tingling. Psychiatric: (-) anxiety, (-) depression   Current Medication: Outpatient Encounter Medications as of 03/10/2020  Medication Sig  . albuterol (PROVENTIL HFA;VENTOLIN HFA) 108 (90 Base) MCG/ACT inhaler Inhale 2 puffs into the lungs every 4 (four) hours as needed for wheezing or shortness of breath.  . allopurinol (ZYLOPRIM) 300 MG tablet Take 1 tablet (300 mg total) by mouth daily.  [name] ELLIPTA 62.5-25 MCG/INH AEPB INHALE 1 PUFF INTO THE LUNGS DAILY. (Patient taking differently: Inhale 1 puff into the lungs daily. )  . aspirin EC 81 MG tablet Take 1 tablet (81 mg total) by mouth daily.  [name] Kitchen atorvastatin (LIPITOR) 40 MG tablet Take 1 tablet (40 mg total) by mouth at bedtime.  . carvedilol (COREG) 25 MG tablet Take 1 tablet (25 mg total) by mouth every 12 (twelve) hours.  (Patient taking differently: Take 25 mg by mouth in the morning and at bedtime. )  . Cholecalciferol (D3-1000) 25 MCG (1000 UT) tablet Take 2 tablets (2,000 Units total) by mouth daily.  . cyclobenzaprine (FLEXERIL) 5 MG tablet Take 1 tablet (5 mg total) by mouth 2 (two) times daily as needed for muscle spasms.  . Dulaglutide (TRULICITY) 1.5 OI/7.1IW SOPN Inject 1.5 mg into the skin every 7 (seven) days. Monday  . furosemide (LASIX) 40 MG tablet TAKE 1 & 1/2 TABLETS BY MOUTH TWICE DAILY (Patient taking differently: Take 40 mg by mouth 2 (two) times daily. )  . gabapentin (NEURONTIN) 300 MG capsule TAKE (1) CAPSULE BY MOUTH TWICE DAILY. (Patient taking differently: Take 300 mg by mouth at bedtime. )  . GAVILAX 17 GM/SCOOP powder Take 1 Container by mouth daily as needed for mild constipation or moderate constipation.   [name] Kitchen glimepiride (AMARYL) 4 MG tablet Take 4 mg by mouth daily.  . hydrALAZINE (APRESOLINE) 25 MG tablet TAKE 1 TABLET BY MOUTH TWICE DAILY FOR HIGH BLOOD PRESSURE (Patient taking differently: Take 25 mg by mouth in the morning and at bedtime. )  . insulin degludec (TRESIBA FLEXTOUCH) 100 UNIT/ML SOPN FlexTouch Pen Inject 0.16 mLs (16 Units total) into the skin at bedtime.  . isosorbide mononitrate (IMDUR) 60 MG 24 hr tablet TAKE 1 TABLET BY MOUTH ONCE DAILY. (Patient taking differently: Take 60 mg by mouth daily. )  . latanoprost (XALATAN) 0.005 % ophthalmic solution Place 1 drop into both eyes at bedtime.   . Lido-Capsaicin-Men-Methyl Sal (1ST MEDX-PATCH/ LIDOCAINE) 4-0.025-5-20 % PTCH Apply  1 patch topically daily.  . Magnesium Oxide 200 MG TABS Take 1 tablet (200 mg total) by mouth daily. (Patient taking differently: Take 100 mg by mouth daily. )  . metolazone (ZAROXOLYN) 5 MG tablet Take 1 tablet (5 mg total) by mouth daily.  . Multiple Vitamins-Minerals (TAB-A-VITE) TABS TAKE 1 TABLET BY MOUTH ONCE DAILY. (Patient taking differently: Take 1 tablet by mouth daily. )  . multivitamin  (RENA-VIT) TABS tablet Take 1 tablet by mouth at bedtime. (Patient taking differently: Take 1 tablet by mouth daily. )  . omeprazole (PRILOSEC) 20 MG capsule Take 1 capsule (20 mg total) by mouth 2 (two) times daily before a meal.  . oxyCODONE-acetaminophen (PERCOCET) 5-325 MG tablet Take 1-2 tablets by mouth every 6 (six) hours as needed for moderate pain or severe pain.  . SKYRIZI, 150 MG DOSE, 75 MG/0.83ML PSKT Inject 300 mg into the skin See admin instructions. Inject 300 mg into the skin every 4 months  . traZODone (DESYREL) 100 MG tablet Take one or one and half tab at night for sleep  . Triamcinolone Acetonide (TRIAMCINOLONE 0.1 % CREAM : EUCERIN) CREA Apply 1 application topically 2 (two) times daily. (Patient taking differently: Apply 1 application topically daily. )  . triamcinolone ointment (KENALOG) 0.5 % Apply 1 application topically 2 (two) times daily.  [name] Kitchen VIGAMOX 0.5 % ophthalmic solution    No facility-administered encounter medications on file as of 03/10/2020.    Surgical History: Past Surgical History:  Procedure Laterality Date  . AV FISTULA PLACEMENT Right 11/20/2019   Procedure: ARTERIOVENOUS (AV) FISTULA CREATION ( BRACHIAL CEPHALIC );  Surgeon: Katha Cabal, MD;  Location: [facility] ORS;  Service: Vascular;  Laterality: Right;  . CATARACT EXTRACTION W/PHACO Left 12/17/2019   Procedure: CATARACT EXTRACTION PHACO AND INTRAOCULAR LENS PLACEMENT ([place]) LEFT ISTENT INJ DIABETIC;  Surgeon: Eulogio Bear, MD;  Location: [facility] ORS;  Service: Ophthalmology;  Laterality: Left;  [place] 00:33.2 CDE 2.96 Fluid Pack Lot # [id number]  . CATARACT EXTRACTION W/PHACO Right 02/24/2020   Procedure: CATARACT EXTRACTION PHACO AND INTRAOCULAR LENS PLACEMENT (IOC) RIGHT DIABETIC ISTENT INJ;  Surgeon: Eulogio Bear, MD;  Location: [facility] ORS;  Service: Ophthalmology;  Laterality: Right;  [place] 00:37.3 CDE 2.88 Fluid Pack Lot # [id number]  . CORONARY ANGIOPLASTY WITH STENT PLACEMENT     stent  placement  . DIALYSIS/PERMA CATHETER INSERTION N/A 06/22/2019   Procedure: DIALYSIS/PERMA CATHETER INSERTION;  Surgeon: Algernon Huxley, MD;  Location: [facility];  Service: Cardiovascular;  Laterality: N/A;  . INSERTION OF AHMED VALVE Left 12/17/2019   Procedure: INSERTION OF iSTENT;  Surgeon: Eulogio Bear, MD;  Location: [facility] ORS;  Service: Ophthalmology;  Laterality: Left;    Medical History: Past Medical History:  Diagnosis Date  . Bell's palsy   . Bell's palsy   . Cataract   . CHF (congestive heart failure) ([place])   . Chronic kidney disease    dialysis  . COPD (chronic obstructive pulmonary disease) ([place])   . Coronary artery disease   . Diabetes mellitus without complication ([place])   . Dyspnea    Wheezing  . GERD (gastroesophageal reflux disease)   . Gout   . HOH (hard of hearing)    mild  . Hypercholesterolemia   . Hypertension   . NSVT (nonsustained ventricular tachycardia) ([place])   . Obstructive sleep apnea    CPAP, O2 use continuosly at 3LPM  . Orthopnea   . Oxygen dependent    3 lpm continuous  .  Pneumonia    In Past X2 most recent 1 yr ago  . Psoriasis   . Psoriasis   . Pulmonary HTN ([place])   . Renal failure     Family History: Family History  Problem Relation Age of Onset  . Heart failure Mother   . Kidney failure Brother     Social History: Social History   Socioeconomic History  . Marital status: Single    Spouse name: Not on file  . Number of children: Not on file  . Years of education: Not on file  . Highest education level: Not on file  Occupational History    Comment: disabled  Tobacco Use  . Smoking status: Never Smoker  . Smokeless tobacco: Never Used  Substance and Sexual Activity  . Alcohol use: Not Currently    Alcohol/week: 1.0 standard drinks    Types: 1 Cans of beer per week    Comment: occasional  . Drug use: Not Currently    Types: Marijuana    Comment: in high school   . Sexual activity: Not on file  Other  Topics Concern  . Not on file  Social History Narrative   Patient uses continuous oxygen at 3L/min   Dialysis, AV fistula in L arm   Social Determinants of Health   Financial Resource Strain:   . Difficulty of Paying Living Expenses:   Food Insecurity:   . Worried About Charity fundraiser in the Last Year:   . Arboriculturist in the Last Year:   Transportation Needs:   . Film/video editor (Medical):   [name] Kitchen Lack of Transportation (Non-Medical):   Physical Activity:   . Days of Exercise per Week:   . Minutes of Exercise per Session:   Stress:   . Feeling of Stress :   Social Connections:   . Frequency of Communication with Friends and Family:   . Frequency of Social Gatherings with Friends and Family:   . Attends Religious Services:   . Active Member of Clubs or Organizations:   . Attends Archivist Meetings:   [name] Kitchen Marital Status:   Intimate Partner Violence:   . Fear of Current or Ex-Partner:   . Emotionally Abused:   [name] Kitchen Physically Abused:   . Sexually Abused:     Vital Signs: Blood pressure 121/74, pulse 83, temperature (!) 97.3 F (36.3 C), resp. rate 16, height 5\' 3"  (1.6 m), weight 267 lb (121.1 kg), SpO2 (!) 88 %.  Examination: General Appearance: The patient is well-developed, well-nourished, and in no distress. Skin: Gross inspection of skin unremarkable. Head: normocephalic, no gross deformities. Eyes: no gross deformities noted. ENT: ears appear grossly normal no exudates. Neck: Supple. No thyromegaly. No LAD. Respiratory: clear bilateraly. Cardiovascular: Normal S1 and S2 without murmur or rub. Extremities: No cyanosis. pulses are equal. Neurologic: Alert and oriented. No involuntary movements.  LABS: Recent Results (from the past 2160 hour(s))  SARS CORONAVIRUS 2 (TAT 6-24 HRS) Nasopharyngeal Nasopharyngeal Swab     Status: None   Collection Time: 12/15/19 10:57 AM   Specimen: Nasopharyngeal Swab  Result Value Ref Range   SARS Coronavirus 2  NEGATIVE NEGATIVE    Comment: (NOTE) SARS-CoV-2 target nucleic acids are NOT DETECTED. The SARS-CoV-2 RNA is generally detectable in upper and lower respiratory specimens during the acute phase of infection. Negative results do not preclude SARS-CoV-2 infection, do not rule out co-infections with other pathogens, and should not be used as the sole basis for treatment or  other patient management decisions. Negative results must be combined with clinical observations, patient history, and epidemiological information. The expected result is Negative. Fact Sheet for Patients: SugarRoll.be Fact Sheet for Healthcare Providers: https://www.woods-mathews.com/ This test is not yet approved or cleared by the [place] FDA and  has been authorized for detection and/or diagnosis of SARS-CoV-2 by FDA under an Emergency Use Authorization (EUA). This EUA will remain  in effect (meaning this test can be used) for the duration of the COVID-19 declaration under Section 56 4(b)(1) of the Act, 21 U.S.C. section 360bbb-3(b)(1), unless the authorization is terminated or revoked sooner. Performed at [facility], [place] [address], [place], [place] [address]   I-STAT, [place] 8     Status: Abnormal   Collection Time: 12/17/19  6:44 AM  Result Value Ref Range   Sodium 141 135 - 145 mmol/L   Potassium 4.6 3.5 - 5.1 mmol/L   Chloride 107 98 - 111 mmol/L   BUN 39 (H) 6 - 20 mg/dL   Creatinine, Ser 3.20 (H) 0.61 - 1.24 mg/dL   Glucose, Bld 196 (H) 70 - 99 mg/dL   Calcium, Ion 1.05 (L) 1.15 - 1.40 mmol/L   TCO2 28 22 - 32 mmol/L   Hemoglobin 12.2 (L) 13.0 - 17.0 g/dL   HCT 36.0 (L) 39.0 - 52.0 %  SARS CORONAVIRUS 2 (TAT 6-24 HRS) Nasopharyngeal Nasopharyngeal Swab     Status: None   Collection Time: 02/22/20 11:29 AM   Specimen: Nasopharyngeal Swab  Result Value Ref Range   SARS Coronavirus 2 NEGATIVE NEGATIVE    Comment: (NOTE) SARS-CoV-2 target nucleic  acids are NOT DETECTED. The SARS-CoV-2 RNA is generally detectable in upper and lower respiratory specimens during the acute phase of infection. Negative results do not preclude SARS-CoV-2 infection, do not rule out co-infections with other pathogens, and should not be used as the sole basis for treatment or other patient management decisions. Negative results must be combined with clinical observations, patient history, and epidemiological information. The expected result is Negative. Fact Sheet for Patients: SugarRoll.be Fact Sheet for Healthcare Providers: https://www.woods-mathews.com/ This test is not yet approved or cleared by the [place] FDA and  has been authorized for detection and/or diagnosis of SARS-CoV-2 by FDA under an Emergency Use Authorization (EUA). This EUA will remain  in effect (meaning this test can be used) for the duration of the COVID-19 declaration under Section 56 4(b)(1) of the Act, 21 U.S.C. section 360bbb-3(b)(1), unless the authorization is terminated or revoked sooner. Performed at [facility], [place] [address] [place], [place] [address]   I-STAT, [place] [address]     Status: Abnormal   Collection Time: 02/24/20  6:35 AM  Result Value Ref Range   Sodium 138 135 - 145 mmol/L   Potassium 3.9 3.5 - 5.1 mmol/L   Chloride 99 98 - 111 mmol/L   BUN 26 (H) 6 - 20 mg/dL   Creatinine, Ser 3.80 (H) 0.61 - 1.24 mg/dL   Glucose, Bld 110 (H) 70 - 99 mg/dL    Comment: Glucose reference range applies only to samples taken after fasting for at least 8 hours.   Calcium, Ion 1.11 (L) 1.15 - 1.40 mmol/L   TCO2 30 22 - 32 mmol/L   Hemoglobin 12.9 (L) 13.0 - 17.0 g/dL   HCT 38.0 (L) 39.0 - 52.0 %    Radiology: No results found.  No results found.  No results found.    Assessment and Plan: Patient Active  Problem List   Diagnosis Date Noted  . Complication from renal dialysis device 03/09/2020  . 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 Hyperkalemia 06/15/2019  . Type 2 diabetes mellitus with hyperglycemia, with long-term current use of insulin ([place]) 03/05/2019  . Hypoglycemia 02/18/2019  . Altered mental status 02/18/2019  . RLQ abdominal mass 01/20/2019  . CHF (congestive heart failure) ([place]) 10/07/2018  . Muscle cramps 10/07/2018  . Shortness of breath 09/08/2018  . Morbid obesity ([place]) 06/24/2018  . Acute on chronic respiratory failure with hypoxia and hypercapnia ([place]) 04/26/2018  . Morbid obesity with BMI of 50.0-59.9, adult ([place]) 04/23/2018  . Chronic diastolic heart failure ([place]) 02/10/2018  . Obstructive sleep apnea 02/10/2018  . Diabetes mellitus without complication ([place]) 20/09/711  . CAD (coronary artery disease) 01/31/2017  . Hyperlipidemia, unspecified 01/31/2017  . Essential hypertension 01/17/2017  . Psoriasis 01/17/2017  . Chronic gout of multiple sites 12/21/2016  . DM type 2 with diabetic peripheral neuropathy ([place]) 06/25/2016  . Coronary artery disease involving native coronary artery 06/14/2015  . Cardiomyopathy ([place]) 08/19/2012  . End stage renal disease ([place]) 08/19/2012  . Hyperlipidemia associated with type 2 diabetes mellitus ([place]) 12/17/1998    1. OSA treated with BiPAP Stable, continue to use bipap as discussed.   2. Moderate persistent asthma, unspecified whether complicated Stable, continue to use inhalers as prescribed.   3. Oxygen dependent PT needs oxygen for room air saturation 88% at rest.   4. Essential hypertension Stable, continue with bp meds as before.   5. Morbid obesity ([place]) Obesity Counseling: Risk Assessment: An assessment of behavioral risk factors was made today and includes lack of exercise sedentary lifestyle, lack of portion control and poor dietary habits.  Risk Modification Advice: She was counseled on portion control guidelines. Restricting daily caloric intake to 1800. The  detrimental long term effects of obesity on her health and ongoing poor compliance was also discussed with the patient.  6. BMI 40.0-44.9, adult (HCC) Overweight.  General Counseling: I have discussed the findings of the evaluation and examination with Dellis Filbert.  I have also discussed any further diagnostic evaluation thatmay be needed or ordered today. Jadakiss verbalizes understanding of the findings of todays visit. We also reviewed his medications today and discussed drug interactions and side effects including but not limited excessive drowsiness and altered mental states. We also discussed that there is always a risk not just to him but also people around him. he has been encouraged to call the office with any questions or concerns that should arise related to todays visit.  No orders of the defined types were placed in this encounter.    Time spent: 25 This patient was seen by Orson Gear AGNP-C in Collaboration with Dr. Devona Konig as a part of collaborative care agreement.   I have personally obtained a history, examined the patient, evaluated laboratory and imaging results, formulated the assessment and plan and placed orders.    Allyne Gee, MD [facility] Pulmonary and Critical Care Sleep medicine

## 2020-03-11 DIAGNOSIS — N2581 Secondary hyperparathyroidism of renal origin: Secondary | ICD-10-CM | POA: Diagnosis not present

## 2020-03-11 DIAGNOSIS — N186 End stage renal disease: Secondary | ICD-10-CM | POA: Diagnosis not present

## 2020-03-11 DIAGNOSIS — Z23 Encounter for immunization: Secondary | ICD-10-CM | POA: Diagnosis not present

## 2020-03-11 DIAGNOSIS — Z992 Dependence on renal dialysis: Secondary | ICD-10-CM | POA: Diagnosis not present

## 2020-03-11 DIAGNOSIS — D509 Iron deficiency anemia, unspecified: Secondary | ICD-10-CM | POA: Diagnosis not present

## 2020-03-14 DIAGNOSIS — D509 Iron deficiency anemia, unspecified: Secondary | ICD-10-CM | POA: Diagnosis not present

## 2020-03-14 DIAGNOSIS — N2581 Secondary hyperparathyroidism of renal origin: Secondary | ICD-10-CM | POA: Diagnosis not present

## 2020-03-14 DIAGNOSIS — Z23 Encounter for immunization: Secondary | ICD-10-CM | POA: Diagnosis not present

## 2020-03-14 DIAGNOSIS — N186 End stage renal disease: Secondary | ICD-10-CM | POA: Diagnosis not present

## 2020-03-14 DIAGNOSIS — Z992 Dependence on renal dialysis: Secondary | ICD-10-CM | POA: Diagnosis not present

## 2020-03-16 DIAGNOSIS — Z992 Dependence on renal dialysis: Secondary | ICD-10-CM | POA: Diagnosis not present

## 2020-03-16 DIAGNOSIS — Z23 Encounter for immunization: Secondary | ICD-10-CM | POA: Diagnosis not present

## 2020-03-16 DIAGNOSIS — D509 Iron deficiency anemia, unspecified: Secondary | ICD-10-CM | POA: Diagnosis not present

## 2020-03-16 DIAGNOSIS — N186 End stage renal disease: Secondary | ICD-10-CM | POA: Diagnosis not present

## 2020-03-16 DIAGNOSIS — N2581 Secondary hyperparathyroidism of renal origin: Secondary | ICD-10-CM | POA: Diagnosis not present

## 2020-03-17 ENCOUNTER — Telehealth: Payer: Self-pay

## 2020-03-17 DIAGNOSIS — N186 End stage renal disease: Secondary | ICD-10-CM | POA: Diagnosis not present

## 2020-03-17 DIAGNOSIS — E1169 Type 2 diabetes mellitus with other specified complication: Secondary | ICD-10-CM | POA: Diagnosis not present

## 2020-03-17 DIAGNOSIS — E785 Hyperlipidemia, unspecified: Secondary | ICD-10-CM | POA: Diagnosis not present

## 2020-03-17 DIAGNOSIS — Z794 Long term (current) use of insulin: Secondary | ICD-10-CM | POA: Diagnosis not present

## 2020-03-17 DIAGNOSIS — Z992 Dependence on renal dialysis: Secondary | ICD-10-CM | POA: Diagnosis not present

## 2020-03-17 DIAGNOSIS — E1122 Type 2 diabetes mellitus with diabetic chronic kidney disease: Secondary | ICD-10-CM | POA: Diagnosis not present

## 2020-03-17 DIAGNOSIS — E1159 Type 2 diabetes mellitus with other circulatory complications: Secondary | ICD-10-CM | POA: Diagnosis not present

## 2020-03-17 DIAGNOSIS — I10 Essential (primary) hypertension: Secondary | ICD-10-CM | POA: Diagnosis not present

## 2020-03-17 NOTE — Telephone Encounter (Signed)
Confirmed appointment on 03/22/2020 and screened for covid. kl[facility] 

## 2020-03-18 DIAGNOSIS — Z992 Dependence on renal dialysis: Secondary | ICD-10-CM | POA: Diagnosis not present

## 2020-03-18 DIAGNOSIS — N2581 Secondary hyperparathyroidism of renal origin: Secondary | ICD-10-CM | POA: Diagnosis not present

## 2020-03-18 DIAGNOSIS — Z23 Encounter for immunization: Secondary | ICD-10-CM | POA: Diagnosis not present

## 2020-03-18 DIAGNOSIS — D509 Iron deficiency anemia, unspecified: Secondary | ICD-10-CM | POA: Diagnosis not present

## 2020-03-18 DIAGNOSIS — N186 End stage renal disease: Secondary | ICD-10-CM | POA: Diagnosis not present

## 2020-03-18 DIAGNOSIS — D631 Anemia in chronic kidney disease: Secondary | ICD-10-CM | POA: Diagnosis not present

## 2020-03-21 ENCOUNTER — Other Ambulatory Visit: Payer: Self-pay

## 2020-03-21 DIAGNOSIS — M1A09X0 Idiopathic chronic gout, multiple sites, without tophus (tophi): Secondary | ICD-10-CM

## 2020-03-21 DIAGNOSIS — N186 End stage renal disease: Secondary | ICD-10-CM | POA: Diagnosis not present

## 2020-03-21 DIAGNOSIS — D631 Anemia in chronic kidney disease: Secondary | ICD-10-CM | POA: Diagnosis not present

## 2020-03-21 DIAGNOSIS — D509 Iron deficiency anemia, unspecified: Secondary | ICD-10-CM | POA: Diagnosis not present

## 2020-03-21 DIAGNOSIS — E7800 Pure hypercholesterolemia, unspecified: Secondary | ICD-10-CM

## 2020-03-21 DIAGNOSIS — N2581 Secondary hyperparathyroidism of renal origin: Secondary | ICD-10-CM | POA: Diagnosis not present

## 2020-03-21 DIAGNOSIS — Z23 Encounter for immunization: Secondary | ICD-10-CM | POA: Diagnosis not present

## 2020-03-21 DIAGNOSIS — Z992 Dependence on renal dialysis: Secondary | ICD-10-CM | POA: Diagnosis not present

## 2020-03-21 DIAGNOSIS — I429 Cardiomyopathy, unspecified: Secondary | ICD-10-CM

## 2020-03-21 MED ORDER — CARVEDILOL 25 MG PO TABS: 25.0000 mg | ORAL_TABLET | Freq: Two times a day (BID) | ORAL | 3 refills | Status: DC

## 2020-03-21 MED ORDER — ALLOPURINOL 300 MG PO TABS: 300.0000 mg | ORAL_TABLET | Freq: Every day | ORAL | 4 refills | Status: DC

## 2020-03-21 MED ORDER — ATORVASTATIN CALCIUM 40 MG PO TABS: 40.0000 mg | ORAL_TABLET | Freq: Every day | ORAL | 3 refills | Status: DC

## 2020-03-22 ENCOUNTER — Ambulatory Visit (INDEPENDENT_AMBULATORY_CARE_PROVIDER_SITE_OTHER): Payer: Medicare Other | Admitting: Adult Health

## 2020-03-22 ENCOUNTER — Other Ambulatory Visit: Payer: Self-pay

## 2020-03-22 ENCOUNTER — Encounter: Payer: Self-pay | Admitting: Adult Health

## 2020-03-22 VITALS — BP 127/79 | HR 85 | Temp 97.8°F | Resp 16 | Ht 63.0 in | Wt 266.4 lb

## 2020-03-22 DIAGNOSIS — Z9981 Dependence on supplemental oxygen: Secondary | ICD-10-CM | POA: Diagnosis not present

## 2020-03-22 DIAGNOSIS — E1165 Type 2 diabetes mellitus with hyperglycemia: Secondary | ICD-10-CM

## 2020-03-22 DIAGNOSIS — Z741 Need for assistance with personal care: Secondary | ICD-10-CM

## 2020-03-22 DIAGNOSIS — Z6841 Body Mass Index (BMI) 40.0 and over, adult: Secondary | ICD-10-CM

## 2020-03-22 DIAGNOSIS — Z794 Long term (current) use of insulin: Secondary | ICD-10-CM | POA: Diagnosis not present

## 2020-03-22 DIAGNOSIS — I10 Essential (primary) hypertension: Secondary | ICD-10-CM

## 2020-03-22 DIAGNOSIS — N183 Chronic kidney disease, stage 3 unspecified: Secondary | ICD-10-CM

## 2020-03-22 NOTE — Progress Notes (Signed)
[facility] [place], [place] [address]  Internal MEDICINE  Office Visit Note  Patient Name: [name]  [id number]  [id number]  Date of Service: 03/22/2020  Chief Complaint  Patient presents with  . Follow-up    home aide forms  . Diabetes  . Gastroesophageal Reflux  . Hyperlipidemia  . Hypertension    HPI  Pt is here for follow up on DM, GERD, HLD, and HTN.   Also he currently has home health for 2 hours daily, 10 per week. He is requesting more hours due to his shortness of breath limiting his ability to perform his ADL's.  He would benefit for 6-7 days of same level care due to his health decline.  He needs a shower chair,which we will order today.  He continues to needs ongoing limited assistance with dressing, bathing, and eating. Pt would like to switch oxygen companies at this time.  He is having issues with the equipment, and [facility] has been out to his home without result per patient.    Current Medication: Outpatient Encounter Medications as of 03/22/2020  Medication Sig  . albuterol (PROVENTIL HFA;VENTOLIN HFA) 108 (90 Base) MCG/ACT inhaler Inhale 2 puffs into the lungs every 4 (four) hours as needed for wheezing or shortness of breath.  . allopurinol (ZYLOPRIM) 300 MG tablet Take 1 tablet (300 mg total) by mouth daily.  [name] ELLIPTA 62.5-25 MCG/INH AEPB INHALE 1 PUFF INTO THE LUNGS DAILY. (Patient taking differently: Inhale 1 puff into the lungs daily. )  . aspirin EC 81 MG tablet Take 1 tablet (81 mg total) by mouth daily.  [name] Kitchen atorvastatin (LIPITOR) 40 MG tablet Take 1 tablet (40 mg total) by mouth at bedtime.  . carvedilol (COREG) 25 MG tablet Take 1 tablet (25 mg total) by mouth every 12 (twelve) hours.  . Cholecalciferol (D3-1000) 25 MCG (1000 UT) tablet Take 2 tablets (2,000 Units total) by mouth daily.  . cyclobenzaprine (FLEXERIL) 5 MG tablet Take 1 tablet (5 mg total) by mouth 2 (two) times daily as needed for muscle spasms.  .  Dulaglutide (TRULICITY) 1.5 KZ/9.9JT SOPN Inject 1.5 mg into the skin every 7 (seven) days. Monday  . furosemide (LASIX) 40 MG tablet TAKE 1 & 1/2 TABLETS BY MOUTH TWICE DAILY (Patient taking differently: Take 40 mg by mouth 2 (two) times daily. )  . gabapentin (NEURONTIN) 300 MG capsule TAKE (1) CAPSULE BY MOUTH TWICE DAILY. (Patient taking differently: Take 300 mg by mouth at bedtime. )  . GAVILAX 17 GM/SCOOP powder Take 1 Container by mouth daily as needed for mild constipation or moderate constipation.   [name] Kitchen glimepiride (AMARYL) 4 MG tablet Take 4 mg by mouth daily.  . hydrALAZINE (APRESOLINE) 25 MG tablet TAKE 1 TABLET BY MOUTH TWICE DAILY FOR HIGH BLOOD PRESSURE (Patient taking differently: Take 25 mg by mouth in the morning and at bedtime. )  . insulin degludec (TRESIBA FLEXTOUCH) 100 UNIT/ML SOPN FlexTouch Pen Inject 0.16 mLs (16 Units total) into the skin at bedtime.  . isosorbide mononitrate (IMDUR) 60 MG 24 hr tablet TAKE 1 TABLET BY MOUTH ONCE DAILY. (Patient taking differently: Take 60 mg by mouth daily. )  . latanoprost (XALATAN) 0.005 % ophthalmic solution Place 1 drop into both eyes at bedtime.   . Lido-Capsaicin-Men-Methyl Sal (1ST MEDX-PATCH/ LIDOCAINE) 4-0.025-5-20 % PTCH Apply 1 patch topically daily.  . Magnesium Oxide 200 MG TABS Take 1 tablet (200 mg total) by mouth daily. (Patient taking differently: Take 100  mg by mouth daily. )  . metolazone (ZAROXOLYN) 5 MG tablet Take 1 tablet (5 mg total) by mouth daily.  . Multiple Vitamins-Minerals (TAB-A-VITE) TABS TAKE 1 TABLET BY MOUTH ONCE DAILY. (Patient taking differently: Take 1 tablet by mouth daily. )  . multivitamin (RENA-VIT) TABS tablet Take 1 tablet by mouth at bedtime. (Patient taking differently: Take 1 tablet by mouth daily. )  . omeprazole (PRILOSEC) 20 MG capsule Take 1 capsule (20 mg total) by mouth 2 (two) times daily before a meal.  . oxyCODONE-acetaminophen (PERCOCET) 5-325 MG tablet Take 1-2 tablets by mouth every 6  (six) hours as needed for moderate pain or severe pain.  . SKYRIZI, 150 MG DOSE, 75 MG/0.83ML PSKT Inject 300 mg into the skin See admin instructions. Inject 300 mg into the skin every 4 months  . traZODone (DESYREL) 100 MG tablet Take one or one and half tab at night for sleep  . Triamcinolone Acetonide (TRIAMCINOLONE 0.1 % CREAM : EUCERIN) CREA Apply 1 application topically 2 (two) times daily. (Patient taking differently: Apply 1 application topically daily. )  . triamcinolone ointment (KENALOG) 0.5 % Apply 1 application topically 2 (two) times daily.  [name] Kitchen VIGAMOX 0.5 % ophthalmic solution    No facility-administered encounter medications on file as of 03/22/2020.    Surgical History: Past Surgical History:  Procedure Laterality Date  . AV FISTULA PLACEMENT Right 11/20/2019   Procedure: ARTERIOVENOUS (AV) FISTULA CREATION ( BRACHIAL CEPHALIC );  Surgeon: Katha Cabal, MD;  Location: [facility] ORS;  Service: Vascular;  Laterality: Right;  . CATARACT EXTRACTION W/PHACO Left 12/17/2019   Procedure: CATARACT EXTRACTION PHACO AND INTRAOCULAR LENS PLACEMENT ([place]) LEFT ISTENT INJ DIABETIC;  Surgeon: Eulogio Bear, MD;  Location: [facility];  Service: Ophthalmology;  Laterality: Left;  [place] 00:33.2 CDE 2.96 Fluid Pack Lot # [id number]  . CATARACT EXTRACTION W/PHACO Right 02/24/2020   Procedure: CATARACT EXTRACTION PHACO AND INTRAOCULAR LENS PLACEMENT (IOC) RIGHT DIABETIC ISTENT INJ;  Surgeon: Eulogio Bear, MD;  Location: [facility] ORS;  Service: Ophthalmology;  Laterality: Right;  [place] 00:37.3 CDE 2.88 Fluid Pack Lot # [id number]  . CORONARY ANGIOPLASTY WITH STENT PLACEMENT     stent placement  . DIALYSIS/PERMA CATHETER INSERTION N/A 06/22/2019   Procedure: DIALYSIS/PERMA CATHETER INSERTION;  Surgeon: Algernon Huxley, MD;  Location: [place] [facility];  Service: Cardiovascular;  Laterality: N/A;  . INSERTION OF AHMED VALVE Left 12/17/2019   Procedure: INSERTION OF iSTENT;  Surgeon: Eulogio Bear, MD;   Location: [facility];  Service: Ophthalmology;  Laterality: Left;    Medical History: Past Medical History:  Diagnosis Date  . Bell's palsy   . Bell's palsy   . Cataract   . CHF (congestive heart failure) ([place])   . Chronic kidney disease    dialysis  . COPD (chronic obstructive pulmonary disease) ([place])   . Coronary artery disease   . Diabetes mellitus without complication ([place])   . Dyspnea    Wheezing  . GERD (gastroesophageal reflux disease)   . Gout   . HOH (hard of hearing)    mild  . Hypercholesterolemia   . Hypertension   . NSVT (nonsustained ventricular tachycardia) ([place])   . Obstructive sleep apnea    CPAP, O2 use continuosly at 3LPM  . Orthopnea   . Oxygen dependent    3 lpm continuous  . Pneumonia    In Past X2 most recent 1 yr ago  . Psoriasis   . Psoriasis   . Pulmonary  HTN ([place])   . Renal failure     Family History: Family History  Problem Relation Age of Onset  . Heart failure Mother   . Kidney failure Brother     Social History   Socioeconomic History  . Marital status: Single    Spouse name: Not on file  . Number of children: Not on file  . Years of education: Not on file  . Highest education level: Not on file  Occupational History    Comment: disabled  Tobacco Use  . Smoking status: Never Smoker  . Smokeless tobacco: Never Used  Substance and Sexual Activity  . Alcohol use: Not Currently    Alcohol/week: 1.0 standard drinks    Types: 1 Cans of beer per week    Comment: occasional  . Drug use: Not Currently    Types: Marijuana    Comment: in high school   . Sexual activity: Not on file  Other Topics Concern  . Not on file  Social History Narrative   Patient uses continuous oxygen at 3L/min   Dialysis, AV fistula in L arm   Social Determinants of Health   Financial Resource Strain:   . Difficulty of Paying Living Expenses:   Food Insecurity:   . Worried About Charity fundraiser in the Last Year:   . Arboriculturist in the  Last Year:   Transportation Needs:   . Film/video editor (Medical):   [name] Kitchen Lack of Transportation (Non-Medical):   Physical Activity:   . Days of Exercise per Week:   . Minutes of Exercise per Session:   Stress:   . Feeling of Stress :   Social Connections:   . Frequency of Communication with Friends and Family:   . Frequency of Social Gatherings with Friends and Family:   . Attends Religious Services:   . Active Member of Clubs or Organizations:   . Attends Archivist Meetings:   [name] Kitchen Marital Status:   Intimate Partner Violence:   . Fear of Current or Ex-Partner:   . Emotionally Abused:   [name] Kitchen Physically Abused:   . Sexually Abused:       Review of Systems  Constitutional: Negative.  Negative for chills, fatigue and unexpected weight change.  HENT: Negative.  Negative for congestion, rhinorrhea, sneezing and sore throat.   Eyes: Negative for redness.  Respiratory: Negative.  Negative for cough, chest tightness and shortness of breath.   Cardiovascular: Negative.  Negative for chest pain and palpitations.  Gastrointestinal: Negative.  Negative for abdominal pain, constipation, diarrhea, nausea and vomiting.  Endocrine: Negative.   Genitourinary: Negative.  Negative for dysuria and frequency.  Musculoskeletal: Negative.  Negative for arthralgias, back pain, joint swelling and neck pain.  Skin: Negative.  Negative for rash.  Allergic/Immunologic: Negative.   Neurological: Negative.  Negative for tremors and numbness.  Hematological: Negative for adenopathy. Does not bruise/bleed easily.  Psychiatric/Behavioral: Negative.  Negative for behavioral problems, sleep disturbance and suicidal ideas. The patient is not nervous/anxious.     Vital Signs: BP 127/79   Pulse 85   Temp 97.8 F (36.6 C)   Resp 16   Ht 5\' 3"  (1.6 m)   Wt 266 lb 6.4 oz (120.8 kg)   SpO2 98%   BMI 47.19 kg/m    Physical Exam Vitals and nursing note reviewed.  Constitutional:      General:  He is not in acute distress.    Appearance: He is well-developed. He is not  diaphoretic.  HENT:     Head: Normocephalic and atraumatic.     Mouth/Throat:     Pharynx: No oropharyngeal exudate.  Eyes:     Pupils: Pupils are equal, round, and reactive to light.  Neck:     Thyroid: No thyromegaly.     Vascular: No JVD.     Trachea: No tracheal deviation.  Cardiovascular:     Rate and Rhythm: Normal rate and regular rhythm.     Heart sounds: Normal heart sounds. No murmur. No friction rub. No gallop.   Pulmonary:     Effort: Pulmonary effort is normal. No respiratory distress.     Breath sounds: Normal breath sounds. No wheezing or rales.  Chest:     Chest wall: No tenderness.  Abdominal:     Palpations: Abdomen is soft.     Tenderness: There is no abdominal tenderness. There is no guarding.  Musculoskeletal:        General: Normal range of motion.     Cervical back: Normal range of motion and neck supple.  Lymphadenopathy:     Cervical: No cervical adenopathy.  Skin:    General: Skin is warm and dry.  Neurological:     Mental Status: He is alert and oriented to person, place, and time.     Cranial Nerves: No cranial nerve deficit.  Psychiatric:        Behavior: Behavior normal.        Thought Content: Thought content normal.        Judgment: Judgment normal.    Assessment/Plan: 1. Essential hypertension Stable, continue present management.   2. Oxygen dependent Continue to use oxygen as prescribed.   3. Type 2 diabetes mellitus with hyperglycemia, with long-term current use of insulin (HCC) Continue current medicaitons.   4. Stage 3 chronic kidney disease, unspecified whether stage 3a or 3b CKD Continue with dialysis as scheduled.   5. BMI 45.0-49.9, adult (HCC) BMI over 40  6. Morbid obesity (HCC) Obesity Counseling: Risk Assessment: An assessment of behavioral risk factors was made today and includes lack of exercise sedentary lifestyle, lack of portion control  and poor dietary habits.  Risk Modification Advice: She was counseled on portion control guidelines. Restricting daily caloric intake to 1800. The detrimental long term effects of obesity on her health and ongoing poor compliance was also discussed with the patient.  7. Requires assistance with activities of daily living (ADL) Needs more hours of home health, increased need of ADL assistance due to weakness.   General Counseling: sharrieff spratlin understanding of the findings of todays visit and agrees with plan of treatment. I have discussed any further diagnostic evaluation that may be needed or ordered today. We also reviewed his medications today. he has been encouraged to call the office with any questions or concerns that should arise related to todays visit.    No orders of the defined types were placed in this encounter.   No orders of the defined types were placed in this encounter.   Time spent: 30 Minutes   This patient was seen by Orson Gear AGNP-C in Collaboration with Dr Lavera Guise as a part of collaborative care agreement     Kendell Bane AGNP-C Internal medicine

## 2020-03-23 DIAGNOSIS — N2581 Secondary hyperparathyroidism of renal origin: Secondary | ICD-10-CM | POA: Diagnosis not present

## 2020-03-23 DIAGNOSIS — D509 Iron deficiency anemia, unspecified: Secondary | ICD-10-CM | POA: Diagnosis not present

## 2020-03-23 DIAGNOSIS — Z23 Encounter for immunization: Secondary | ICD-10-CM | POA: Diagnosis not present

## 2020-03-23 DIAGNOSIS — Z992 Dependence on renal dialysis: Secondary | ICD-10-CM | POA: Diagnosis not present

## 2020-03-23 DIAGNOSIS — D631 Anemia in chronic kidney disease: Secondary | ICD-10-CM | POA: Diagnosis not present

## 2020-03-23 DIAGNOSIS — N186 End stage renal disease: Secondary | ICD-10-CM | POA: Diagnosis not present

## 2020-03-24 ENCOUNTER — Ambulatory Visit (INDEPENDENT_AMBULATORY_CARE_PROVIDER_SITE_OTHER): Payer: Self-pay | Admitting: Gastroenterology

## 2020-03-24 ENCOUNTER — Other Ambulatory Visit: Payer: Self-pay

## 2020-03-24 DIAGNOSIS — Z1211 Encounter for screening for malignant neoplasm of colon: Secondary | ICD-10-CM

## 2020-03-24 NOTE — Progress Notes (Signed)
Patient came in today to discuss scheduling his colonoscopy.  He states that he will need to check with his daughter for transportation arrangements.  Unable to schedule for May, however he is open to scheduling in June after he talks with his daughter.  Instructions reviewed to prepare for colonoscopy and SuPrep Bowel Prep Rx provided with colonoscopy instructions.  Patient has been asked to call the office back once he speaks to his daughter to schedule.  Triage Completed.  Gastroenterology Pre-Procedure Review  Request Date: Patient to Call Office to Schedule Requesting Physician: Dr. ________  PATIENT REVIEW QUESTIONS: The patient responded to the following health history questions as indicated:    1. Are you having any GI issues? no 2. Do you have a personal history of Polyps? no 3. Do you have a family history of Colon Cancer or Polyps? no 4. Diabetes Mellitus? yes (type 2) 5. Joint replacements in the past 12 months?no 6. Major health problems in the past 3 months?Fistula 2 months ago 7. Any artificial heart valves, MVP, or defibrillator?no    MEDICATIONS & ALLERGIES:    Patient reports the following regarding taking any anticoagulation/antiplatelet therapy:   Plavix, Coumadin, Eliquis, Xarelto, Lovenox, Pradaxa, Brilinta, or Effient? no Aspirin? yes (81 mg daily)  Patient confirms/reports the following medications:  Current Outpatient Medications  Medication Sig Dispense Refill  . albuterol (PROVENTIL HFA;VENTOLIN HFA) 108 (90 Base) MCG/ACT inhaler Inhale 2 puffs into the lungs every 4 (four) hours as needed for wheezing or shortness of breath. 6.7 g 5  . allopurinol (ZYLOPRIM) 300 MG tablet Take 1 tablet (300 mg total) by mouth daily. 90 tablet 4  . ANORO ELLIPTA 62.5-25 MCG/INH AEPB INHALE 1 PUFF INTO THE LUNGS DAILY. (Patient taking differently: Inhale 1 puff into the lungs daily. ) 60 each 11  . aspirin EC 81 MG tablet Take 1 tablet (81 mg total) by mouth daily. 90  tablet 1  . atorvastatin (LIPITOR) 40 MG tablet Take 1 tablet (40 mg total) by mouth at bedtime. 90 tablet 3  . carvedilol (COREG) 25 MG tablet Take 1 tablet (25 mg total) by mouth every 12 (twelve) hours. 180 tablet 3  . Cholecalciferol (D3-1000) 25 MCG (1000 UT) tablet Take 2 tablets (2,000 Units total) by mouth daily. 60 tablet 3  . cyclobenzaprine (FLEXERIL) 5 MG tablet Take 1 tablet (5 mg total) by mouth 2 (two) times daily as needed for muscle spasms. 60 tablet 0  . Dulaglutide (TRULICITY) 1.5 XY/8.0XK SOPN Inject 1.5 mg into the skin every 7 (seven) days. Monday 3 pen 3  . furosemide (LASIX) 40 MG tablet TAKE 1 & 1/2 TABLETS BY MOUTH TWICE DAILY (Patient taking differently: Take 40 mg by mouth 2 (two) times daily. ) 90 tablet 3  . gabapentin (NEURONTIN) 300 MG capsule TAKE (1) CAPSULE BY MOUTH TWICE DAILY. (Patient taking differently: Take 300 mg by mouth at bedtime. ) 56 capsule 1  . GAVILAX 17 GM/SCOOP powder Take 1 Container by mouth daily as needed for mild constipation or moderate constipation.     [name] Kitchen glimepiride (AMARYL) 4 MG tablet Take 4 mg by mouth daily.    . hydrALAZINE (APRESOLINE) 25 MG tablet TAKE 1 TABLET BY MOUTH TWICE DAILY FOR HIGH BLOOD PRESSURE (Patient taking differently: Take 25 mg by mouth in the morning and at bedtime. ) 56 tablet 11  . insulin degludec (TRESIBA FLEXTOUCH) 100 UNIT/ML SOPN FlexTouch Pen Inject 0.16 mLs (16 Units total) into the skin at bedtime. 9 pen 3  .  isosorbide mononitrate (IMDUR) 60 MG 24 hr tablet TAKE 1 TABLET BY MOUTH ONCE DAILY. (Patient taking differently: Take 60 mg by mouth daily. ) 30 tablet 3  . latanoprost (XALATAN) 0.005 % ophthalmic solution Place 1 drop into both eyes at bedtime.     . Lido-Capsaicin-Men-Methyl Sal (1ST MEDX-PATCH/ LIDOCAINE) 4-0.025-5-20 % PTCH Apply 1 patch topically daily. 10 patch 2  . Magnesium Oxide 200 MG TABS Take 1 tablet (200 mg total) by mouth daily. (Patient taking differently: Take 100 mg by mouth daily. )  20 tablet 0  . metolazone (ZAROXOLYN) 5 MG tablet Take 1 tablet (5 mg total) by mouth daily. 30 tablet 1  . Multiple Vitamins-Minerals (TAB-A-VITE) TABS TAKE 1 TABLET BY MOUTH ONCE DAILY. (Patient taking differently: Take 1 tablet by mouth daily. ) 90 tablet 3  . multivitamin (RENA-VIT) TABS tablet Take 1 tablet by mouth at bedtime. (Patient taking differently: Take 1 tablet by mouth daily. ) 30 tablet 0  . omeprazole (PRILOSEC) 20 MG capsule Take 1 capsule (20 mg total) by mouth 2 (two) times daily before a meal. 180 capsule 2  . oxyCODONE-acetaminophen (PERCOCET) 5-325 MG tablet Take 1-2 tablets by mouth every 6 (six) hours as needed for moderate pain or severe pain. 40 tablet 0  . SKYRIZI, 150 MG DOSE, 75 MG/0.83ML PSKT Inject 300 mg into the skin See admin instructions. Inject 300 mg into the skin every 4 months    . traZODone (DESYREL) 100 MG tablet Take one or one and half tab at night for sleep 45 tablet 0  . Triamcinolone Acetonide (TRIAMCINOLONE 0.1 % CREAM : EUCERIN) CREA Apply 1 application topically 2 (two) times daily. (Patient taking differently: Apply 1 application topically daily. ) 1 each 5  . triamcinolone ointment (KENALOG) 0.5 % Apply 1 application topically 2 (two) times daily. 30 g 11  . VIGAMOX 0.5 % ophthalmic solution      No current facility-administered medications for this visit.    Patient confirms/reports the following allergies:  Allergies  Allergen Reactions  . Shellfish Allergy Anaphylaxis    Face and throat swelling, difficulty breathing Allergy can be triggered by touching (contact)  . Betadine [Povidone Iodine] Rash    No orders of the defined types were placed in this encounter.   AUTHORIZATION INFORMATION Primary Insurance: 1D#: Group #:  Secondary Insurance: 1D#: Group #:  SCHEDULE INFORMATION: Date: PENDING PT CALL BACK Time: Location:[facility]

## 2020-03-25 ENCOUNTER — Telehealth: Payer: Self-pay

## 2020-03-25 DIAGNOSIS — D631 Anemia in chronic kidney disease: Secondary | ICD-10-CM | POA: Diagnosis not present

## 2020-03-25 DIAGNOSIS — Z23 Encounter for immunization: Secondary | ICD-10-CM | POA: Diagnosis not present

## 2020-03-25 DIAGNOSIS — Z992 Dependence on renal dialysis: Secondary | ICD-10-CM | POA: Diagnosis not present

## 2020-03-25 DIAGNOSIS — D509 Iron deficiency anemia, unspecified: Secondary | ICD-10-CM | POA: Diagnosis not present

## 2020-03-25 DIAGNOSIS — N2581 Secondary hyperparathyroidism of renal origin: Secondary | ICD-10-CM | POA: Diagnosis not present

## 2020-03-25 DIAGNOSIS — N186 End stage renal disease: Secondary | ICD-10-CM | POA: Diagnosis not present

## 2020-03-25 NOTE — Telephone Encounter (Signed)
Called lmom informing patient of appointment on 03/29/2020. klh 

## 2020-03-28 DIAGNOSIS — Z23 Encounter for immunization: Secondary | ICD-10-CM | POA: Diagnosis not present

## 2020-03-28 DIAGNOSIS — N186 End stage renal disease: Secondary | ICD-10-CM | POA: Diagnosis not present

## 2020-03-28 DIAGNOSIS — Z992 Dependence on renal dialysis: Secondary | ICD-10-CM | POA: Diagnosis not present

## 2020-03-28 DIAGNOSIS — N2581 Secondary hyperparathyroidism of renal origin: Secondary | ICD-10-CM | POA: Diagnosis not present

## 2020-03-28 DIAGNOSIS — D509 Iron deficiency anemia, unspecified: Secondary | ICD-10-CM | POA: Diagnosis not present

## 2020-03-28 DIAGNOSIS — D631 Anemia in chronic kidney disease: Secondary | ICD-10-CM | POA: Diagnosis not present

## 2020-03-29 ENCOUNTER — Encounter: Payer: Self-pay | Admitting: Adult Health

## 2020-03-29 ENCOUNTER — Ambulatory Visit (INDEPENDENT_AMBULATORY_CARE_PROVIDER_SITE_OTHER): Payer: Medicare Other | Admitting: Adult Health

## 2020-03-29 ENCOUNTER — Telehealth: Payer: Self-pay

## 2020-03-29 ENCOUNTER — Other Ambulatory Visit: Payer: Self-pay

## 2020-03-29 VITALS — BP 102/72 | HR 78 | Temp 97.5°F | Resp 16 | Ht 63.0 in | Wt 264.0 lb

## 2020-03-29 DIAGNOSIS — N183 Chronic kidney disease, stage 3 unspecified: Secondary | ICD-10-CM | POA: Diagnosis not present

## 2020-03-29 DIAGNOSIS — E1165 Type 2 diabetes mellitus with hyperglycemia: Secondary | ICD-10-CM

## 2020-03-29 DIAGNOSIS — M545 Low back pain, unspecified: Secondary | ICD-10-CM

## 2020-03-29 DIAGNOSIS — Z794 Long term (current) use of insulin: Secondary | ICD-10-CM | POA: Diagnosis not present

## 2020-03-29 DIAGNOSIS — Z9981 Dependence on supplemental oxygen: Secondary | ICD-10-CM

## 2020-03-29 DIAGNOSIS — G4733 Obstructive sleep apnea (adult) (pediatric): Secondary | ICD-10-CM | POA: Diagnosis not present

## 2020-03-29 DIAGNOSIS — Z6841 Body Mass Index (BMI) 40.0 and over, adult: Secondary | ICD-10-CM | POA: Diagnosis not present

## 2020-03-29 DIAGNOSIS — I10 Essential (primary) hypertension: Secondary | ICD-10-CM

## 2020-03-29 MED ORDER — CYCLOBENZAPRINE HCL 5 MG PO TABS: 5.0000 mg | ORAL_TABLET | Freq: Two times a day (BID) | ORAL | 0 refills | Status: DC | PRN

## 2020-03-29 NOTE — Telephone Encounter (Signed)
Faxed [facility] and spoke with Rep (brad) to verify O2 orders from 03/10/2020. Beth

## 2020-03-29 NOTE — Progress Notes (Signed)
[facility] [place], [place] [address]  Internal MEDICINE  Office Visit Note  Patient Name: [name]  [id number]  [id number]  Date of Service: 03/29/2020  Chief Complaint  Patient presents with  . Hypertension  . Diabetes  . Hyperlipidemia  . Gastroesophageal Reflux    HPI Patient is here today to follow-up on his chronic medical issues, HTN, DM, HLD, and GERD. Today his BP is well maintained, denies chest pain, palpitations, headaches or blurred vision. Having issues with his AV fistula due to it infiltrating, they are currently now going back to using his right chest HD port. His right arm around his fistula is bruised with swelling, positive bruit and thrill. Continues to have muscle cramping on days on his dialysis, is well controlled with Flexeril. Will request lab work from his next dialysis treatment, MWF, to assess his magnesium level to continue his magnesium oxide or not. Patient states he will request them to fax over lab work tomorrow to be reviewed. Continuing to work on getting new oxygen company to provide him with portable oxygen. Today his SpO2 on 3L [place] is 97%. Will need to continue with continuous oxygen at 3LPM via [place].    Current Medication: Outpatient Encounter Medications as of 03/29/2020  Medication Sig  . albuterol (PROVENTIL HFA;VENTOLIN HFA) 108 (90 Base) MCG/ACT inhaler Inhale 2 puffs into the lungs every 4 (four) hours as needed for wheezing or shortness of breath.  . allopurinol (ZYLOPRIM) 300 MG tablet Take 1 tablet (300 mg total) by mouth daily.  [name] ELLIPTA 62.5-25 MCG/INH AEPB INHALE 1 PUFF INTO THE LUNGS DAILY. (Patient taking differently: Inhale 1 puff into the lungs daily. )  . aspirin EC 81 MG tablet Take 1 tablet (81 mg total) by mouth daily.  [name] Kitchen atorvastatin (LIPITOR) 40 MG tablet Take 1 tablet (40 mg total) by mouth at bedtime.  . carvedilol (COREG) 25 MG tablet Take 1 tablet (25 mg total) by mouth every 12  (twelve) hours.  . Cholecalciferol (D3-1000) 25 MCG (1000 UT) tablet Take 2 tablets (2,000 Units total) by mouth daily.  . cyclobenzaprine (FLEXERIL) 5 MG tablet Take 1 tablet (5 mg total) by mouth 2 (two) times daily as needed for muscle spasms.  . Dulaglutide (TRULICITY) 1.5 OZ/3.6UY SOPN Inject 1.5 mg into the skin every 7 (seven) days. Monday  . furosemide (LASIX) 40 MG tablet TAKE 1 & 1/2 TABLETS BY MOUTH TWICE DAILY (Patient taking differently: Take 40 mg by mouth 2 (two) times daily. )  . gabapentin (NEURONTIN) 300 MG capsule TAKE (1) CAPSULE BY MOUTH TWICE DAILY. (Patient taking differently: Take 300 mg by mouth at bedtime. )  . GAVILAX 17 GM/SCOOP powder Take 1 Container by mouth daily as needed for mild constipation or moderate constipation.   [name] Kitchen glimepiride (AMARYL) 4 MG tablet Take 4 mg by mouth daily.  . hydrALAZINE (APRESOLINE) 25 MG tablet TAKE 1 TABLET BY MOUTH TWICE DAILY FOR HIGH BLOOD PRESSURE (Patient taking differently: Take 25 mg by mouth in the morning and at bedtime. )  . insulin degludec (TRESIBA FLEXTOUCH) 100 UNIT/ML SOPN FlexTouch Pen Inject 0.16 mLs (16 Units total) into the skin at bedtime.  . isosorbide mononitrate (IMDUR) 60 MG 24 hr tablet TAKE 1 TABLET BY MOUTH ONCE DAILY. (Patient taking differently: Take 60 mg by mouth daily. )  . latanoprost (XALATAN) 0.005 % ophthalmic solution Place 1 drop into both eyes at bedtime.   . Lido-Capsaicin-Men-Methyl Sal (1ST MEDX-PATCH/ LIDOCAINE) 4-0.025-5-20 %  PTCH Apply 1 patch topically daily.  . Magnesium Oxide 200 MG TABS Take 1 tablet (200 mg total) by mouth daily. (Patient taking differently: Take 100 mg by mouth daily. )  . metolazone (ZAROXOLYN) 5 MG tablet Take 1 tablet (5 mg total) by mouth daily.  . Multiple Vitamins-Minerals (TAB-A-VITE) TABS TAKE 1 TABLET BY MOUTH ONCE DAILY. (Patient taking differently: Take 1 tablet by mouth daily. )  . multivitamin (RENA-VIT) TABS tablet Take 1 tablet by mouth at bedtime. (Patient  taking differently: Take 1 tablet by mouth daily. )  . omeprazole (PRILOSEC) 20 MG capsule Take 1 capsule (20 mg total) by mouth 2 (two) times daily before a meal.  . oxyCODONE-acetaminophen (PERCOCET) 5-325 MG tablet Take 1-2 tablets by mouth every 6 (six) hours as needed for moderate pain or severe pain.  . SKYRIZI, 150 MG DOSE, 75 MG/0.83ML PSKT Inject 300 mg into the skin See admin instructions. Inject 300 mg into the skin every 4 months  . traZODone (DESYREL) 100 MG tablet Take one or one and half tab at night for sleep  . Triamcinolone Acetonide (TRIAMCINOLONE 0.1 % CREAM : EUCERIN) CREA Apply 1 application topically 2 (two) times daily. (Patient taking differently: Apply 1 application topically daily. )  . triamcinolone ointment (KENALOG) 0.5 % Apply 1 application topically 2 (two) times daily.  [name] Kitchen VIGAMOX 0.5 % ophthalmic solution   . [DISCONTINUED] cyclobenzaprine (FLEXERIL) 5 MG tablet Take 1 tablet (5 mg total) by mouth 2 (two) times daily as needed for muscle spasms.   No facility-administered encounter medications on file as of 03/29/2020.    Surgical History: Past Surgical History:  Procedure Laterality Date  . AV FISTULA PLACEMENT Right 11/20/2019   Procedure: ARTERIOVENOUS (AV) FISTULA CREATION ( BRACHIAL CEPHALIC );  Surgeon: Katha Cabal, MD;  Location: [facility] ORS;  Service: Vascular;  Laterality: Right;  . CATARACT EXTRACTION W/PHACO Left 12/17/2019   Procedure: CATARACT EXTRACTION PHACO AND INTRAOCULAR LENS PLACEMENT ([place]) LEFT ISTENT INJ DIABETIC;  Surgeon: Eulogio Bear, MD;  Location: [facility] ORS;  Service: Ophthalmology;  Laterality: Left;  [place] 00:33.2 CDE 2.96 Fluid Pack Lot # [id number]  . CATARACT EXTRACTION W/PHACO Right 02/24/2020   Procedure: CATARACT EXTRACTION PHACO AND INTRAOCULAR LENS PLACEMENT (IOC) RIGHT DIABETIC ISTENT INJ;  Surgeon: Eulogio Bear, MD;  Location: [facility] ORS;  Service: Ophthalmology;  Laterality: Right;  [place] 00:37.3 CDE 2.88 Fluid Pack  Lot # [id number]  . CORONARY ANGIOPLASTY WITH STENT PLACEMENT     stent placement  . DIALYSIS/PERMA CATHETER INSERTION N/A 06/22/2019   Procedure: DIALYSIS/PERMA CATHETER INSERTION;  Surgeon: Algernon Huxley, MD;  Location: [place] [facility];  Service: Cardiovascular;  Laterality: N/A;  . INSERTION OF AHMED VALVE Left 12/17/2019   Procedure: INSERTION OF iSTENT;  Surgeon: Eulogio Bear, MD;  Location: [facility] ORS;  Service: Ophthalmology;  Laterality: Left;    Medical History: Past Medical History:  Diagnosis Date  . Bell's palsy   . Bell's palsy   . Cataract   . CHF (congestive heart failure) ([place])   . Chronic kidney disease    dialysis  . COPD (chronic obstructive pulmonary disease) ([place])   . Coronary artery disease   . Diabetes mellitus without complication ([place])   . Dyspnea    Wheezing  . GERD (gastroesophageal reflux disease)   . Gout   . HOH (hard of hearing)    mild  . Hypercholesterolemia   . Hypertension   . NSVT (nonsustained ventricular tachycardia) ([place])   .  Obstructive sleep apnea    CPAP, O2 use continuosly at 3LPM  . Orthopnea   . Oxygen dependent    3 lpm continuous  . Pneumonia    In Past X2 most recent 1 yr ago  . Psoriasis   . Psoriasis   . Pulmonary HTN ([place])   . Renal failure     Family History: Family History  Problem Relation Age of Onset  . Heart failure Mother   . Kidney failure Brother     Social History   Socioeconomic History  . Marital status: Single    Spouse name: Not on file  . Number of children: Not on file  . Years of education: Not on file  . Highest education level: Not on file  Occupational History    Comment: disabled  Tobacco Use  . Smoking status: Never Smoker  . Smokeless tobacco: Never Used  Substance and Sexual Activity  . Alcohol use: Not Currently    Alcohol/week: 1.0 standard drinks    Types: 1 Cans of beer per week    Comment: occasional  . Drug use: Not Currently    Types: Marijuana    Comment: in  high school   . Sexual activity: Not on file  Other Topics Concern  . Not on file  Social History Narrative   Patient uses continuous oxygen at 3L/min   Dialysis, AV fistula in L arm   Social Determinants of Health   Financial Resource Strain:   . Difficulty of Paying Living Expenses:   Food Insecurity:   . Worried About Charity fundraiser in the Last Year:   . Arboriculturist in the Last Year:   Transportation Needs:   . Film/video editor (Medical):   [name] Kitchen Lack of Transportation (Non-Medical):   Physical Activity:   . Days of Exercise per Week:   . Minutes of Exercise per Session:   Stress:   . Feeling of Stress :   Social Connections:   . Frequency of Communication with Friends and Family:   . Frequency of Social Gatherings with Friends and Family:   . Attends Religious Services:   . Active Member of Clubs or Organizations:   . Attends Archivist Meetings:   [name] Kitchen Marital Status:   Intimate Partner Violence:   . Fear of Current or Ex-Partner:   . Emotionally Abused:   [name] Kitchen Physically Abused:   . Sexually Abused:       Review of Systems  Constitutional: Negative.  Negative for chills, fatigue and unexpected weight change.  HENT: Negative.  Negative for congestion, rhinorrhea, sneezing and sore throat.   Eyes: Negative for redness.  Respiratory: Negative.  Negative for cough, chest tightness and shortness of breath.   Cardiovascular: Negative.  Negative for chest pain and palpitations.  Gastrointestinal: Negative.  Negative for abdominal pain, constipation, diarrhea, nausea and vomiting.  Endocrine: Negative.   Genitourinary: Negative.  Negative for dysuria and frequency.  Musculoskeletal: Positive for arthralgias and myalgias. Negative for back pain, joint swelling and neck pain.  Skin: Negative.  Negative for rash.       Bruising and swelling to right arm around AV fistula site  Allergic/Immunologic: Negative.   Neurological: Negative.  Negative for tremors  and numbness.  Hematological: Negative for adenopathy. Does not bruise/bleed easily.  Psychiatric/Behavioral: Negative.  Negative for behavioral problems, sleep disturbance and suicidal ideas. The patient is not nervous/anxious.     Vital Signs: BP 102/72   Pulse 78  Temp (!) 97.5 F (36.4 C)   Resp 16   Ht 5\' 3"  (1.6 m)   Wt 264 lb (119.7 kg)   SpO2 97%   BMI 46.77 kg/m    Physical Exam Vitals and nursing note reviewed.  Constitutional:      General: He is not in acute distress.    Appearance: He is well-developed. He is not diaphoretic.  HENT:     Head: Normocephalic and atraumatic.     Mouth/Throat:     Pharynx: No oropharyngeal exudate.  Eyes:     Pupils: Pupils are equal, round, and reactive to light.  Neck:     Thyroid: No thyromegaly.     Vascular: No JVD.     Trachea: No tracheal deviation.  Cardiovascular:     Rate and Rhythm: Normal rate and regular rhythm.     Heart sounds: Normal heart sounds. No murmur. No friction rub. No gallop.   Pulmonary:     Effort: Pulmonary effort is normal. No respiratory distress.     Breath sounds: Normal breath sounds. No wheezing or rales.  Chest:     Chest wall: No tenderness.  Abdominal:     Palpations: Abdomen is soft.     Tenderness: There is no abdominal tenderness. There is no guarding.  Musculoskeletal:        General: Normal range of motion.     Cervical back: Normal range of motion and neck supple.  Lymphadenopathy:     Cervical: No cervical adenopathy.  Skin:    General: Skin is warm and dry.     Findings: Bruising present.          Comments: Right AV fistula, bruising and swelling due to recent HD treatment  Neurological:     Mental Status: He is alert and oriented to person, place, and time.     Cranial Nerves: No cranial nerve deficit.  Psychiatric:        Behavior: Behavior normal.        Thought Content: Thought content normal.        Judgment: Judgment normal.    Assessment/Plan: 1. Essential  hypertension BP stable today, continue with current treatment.  2. Stage 3 chronic kidney disease, unspecified whether stage 3a or 3b CKD HD MWF, continue with scheduled HD. Following along for management of lab results, will monitor his magnesium levels.  3. Left low back pain, unspecified chronicity, unspecified whether sciatica present Muscle pain to left lower back, intermittent pain, worse during HD sessions. Continue with Flexeril as needed for symptom management. - cyclobenzaprine (FLEXERIL) 5 MG tablet; Take 1 tablet (5 mg total) by mouth 2 (two) times daily as needed for muscle spasms.  Dispense: 60 tablet; Refill: 0  4. Oxygen dependent Continue with 3LPM via [place] continuously. To follow up with his new oxygen company to get more portable tanks.  5. Obstructive sleep apnea CPAP therapy for OSA, reports compliance, continue with therapy.  6. Type 2 diabetes mellitus with hyperglycemia, with long-term current use of insulin ([place]) Continue to follow up with endocrinology.  7. BMI 45.0-49.9, adult (HCC) Comorbidities of elevated BMI include HTN, HLD, OSA as well as CKD.  8. Morbid obesity ([place]) Obesity Counseling: Risk Assessment: An assessment of behavioral risk factors was made today and includes lack of exercise sedentary lifestyle, lack of portion control and poor dietary habits.  Risk Modification Advice: He was counseled on portion control guidelines. Restricting daily caloric intake to 1800. The detrimental long term effects of  obesity on his health and ongoing poor compliance was also discussed with the patient.    General Counseling: stark aguinaga understanding of the findings of todays visit and agrees with plan of treatment. I have discussed any further diagnostic evaluation that may be needed or ordered today. We also reviewed his medications today. he has been encouraged to call the office with any questions or concerns that should arise related to todays  visit.    No orders of the defined types were placed in this encounter.   Meds ordered this encounter  Medications  . cyclobenzaprine (FLEXERIL) 5 MG tablet    Sig: Take 1 tablet (5 mg total) by mouth 2 (two) times daily as needed for muscle spasms.    Dispense:  60 tablet    Refill:  0    Time spent: 30 Minutes   This patient was seen by Orson Gear AGNP-C in Collaboration with Dr Lavera Guise as a part of collaborative care agreement     Kendell Bane AGNP-C Internal medicine

## 2020-03-30 DIAGNOSIS — D631 Anemia in chronic kidney disease: Secondary | ICD-10-CM | POA: Diagnosis not present

## 2020-03-30 DIAGNOSIS — D509 Iron deficiency anemia, unspecified: Secondary | ICD-10-CM | POA: Diagnosis not present

## 2020-03-30 DIAGNOSIS — Z992 Dependence on renal dialysis: Secondary | ICD-10-CM | POA: Diagnosis not present

## 2020-03-30 DIAGNOSIS — N2581 Secondary hyperparathyroidism of renal origin: Secondary | ICD-10-CM | POA: Diagnosis not present

## 2020-03-30 DIAGNOSIS — N186 End stage renal disease: Secondary | ICD-10-CM | POA: Diagnosis not present

## 2020-03-30 DIAGNOSIS — Z23 Encounter for immunization: Secondary | ICD-10-CM | POA: Diagnosis not present

## 2020-03-31 ENCOUNTER — Ambulatory Visit (INDEPENDENT_AMBULATORY_CARE_PROVIDER_SITE_OTHER): Payer: Medicare Other | Admitting: Dermatology

## 2020-03-31 ENCOUNTER — Encounter: Payer: Self-pay | Admitting: Dermatology

## 2020-03-31 ENCOUNTER — Other Ambulatory Visit: Payer: Self-pay

## 2020-03-31 ENCOUNTER — Encounter: Payer: Self-pay | Admitting: *Deleted

## 2020-03-31 DIAGNOSIS — I130 Hypertensive heart and chronic kidney disease with heart failure and stage 1 through stage 4 chronic kidney disease, or unspecified chronic kidney disease: Secondary | ICD-10-CM

## 2020-03-31 DIAGNOSIS — N189 Chronic kidney disease, unspecified: Secondary | ICD-10-CM | POA: Diagnosis not present

## 2020-03-31 DIAGNOSIS — L409 Psoriasis, unspecified: Secondary | ICD-10-CM

## 2020-03-31 DIAGNOSIS — E119 Type 2 diabetes mellitus without complications: Secondary | ICD-10-CM | POA: Diagnosis not present

## 2020-03-31 DIAGNOSIS — J449 Chronic obstructive pulmonary disease, unspecified: Secondary | ICD-10-CM | POA: Diagnosis not present

## 2020-03-31 DIAGNOSIS — Z79899 Other long term (current) drug therapy: Secondary | ICD-10-CM | POA: Diagnosis not present

## 2020-03-31 MED ORDER — TRIAMCINOLONE ACETONIDE 0.1 % EX CREA: 1.0000 "application " | TOPICAL_CREAM | CUTANEOUS | 3 refills | Status: DC

## 2020-03-31 MED ORDER — RISANKIZUMAB-RZAA(150 MG DOSE) 75 MG/0.83ML ~~LOC~~ PSKT
150.0000 mg | PREFILLED_SYRINGE | Freq: Once | SUBCUTANEOUS | Status: AC
  Administered 2020-03-31: 09:00:00 150 mg via SUBCUTANEOUS

## 2020-03-31 NOTE — Progress Notes (Signed)
   Follow-Up Visit   Subjective  [name] is a 54 y.o. male who presents for the following: Psoriasis (39m f/u trunk, extremities, scalp, Skyrizi 150mg  sq injections q 12 wks, TMC 0.1% cr ~4d/wk ).  Patient has severe generalized psoriasis but has done extremely well on systemic Skyrizi without any significant side effects.  He has not been compliant with communicating with pharmacy about delivery of his medication and he does not have his medication with him for injection today.  We had him contact the pharmacy by phone today in the office.  The following portions of the chart were reviewed this encounter and updated as appropriate: Tobacco  Allergies  Meds  Problems  Med Hx  Surg Hx  Fam Hx      Review of Systems: No other skin or systemic complaints.  Objective  Well appearing patient in no apparent distress; mood and affect are within normal limits.  A focused examination was performed including trunk, extremities, buttocks, scalp. Relevant physical exam findings are noted in the Assessment and Plan.  Objective  Trunk, extremities, scalp: Psoriatic plaques sacral buttock, legs, elbows, upper body mostly clear.  Images            Assessment & Plan   Long term medication management.  Psoriasis Trunk, extremities, scalp  Severe without PsA, much improved on Skyrizi since 07/2019 BSA 10% today  Psoriasis complicated by diabetes, kidney disease on dialysis, COPD, Hypertension, CHF  Cont Skyrizi 150mg  (75mg  x 2) sq injections q 12 wks. Cont TMC 0.1% cr qd up to 5d/wk aa psoriasis, avoid f/g/a  Skyrizi 150mg  (75mg  x 2) sq injections today to L and R abdomen.   Lot #[id number] exp 05/2020  Discussed with pt self injecting at home.  Pt can start injecting at home and will need to come in office q 6 months.  Plan labs on f/u  Risankizumab-rzaa(150 MG Dose) PSKT 150 mg - Trunk, extremities, scalp  triamcinolone cream (KENALOG) 0.1 % - Trunk, extremities,  scalp  Return in about 6 months (around 09/30/2020) for Psoriasis.   I, Othelia Pulling, RMA, am acting as scribe for Sarina Ser, MD . Documentation: I have reviewed the above documentation for accuracy and completeness, and I agree with the above.  Sarina Ser, MD

## 2020-04-01 DIAGNOSIS — D631 Anemia in chronic kidney disease: Secondary | ICD-10-CM | POA: Diagnosis not present

## 2020-04-01 DIAGNOSIS — Z23 Encounter for immunization: Secondary | ICD-10-CM | POA: Diagnosis not present

## 2020-04-01 DIAGNOSIS — N2581 Secondary hyperparathyroidism of renal origin: Secondary | ICD-10-CM | POA: Diagnosis not present

## 2020-04-01 DIAGNOSIS — N186 End stage renal disease: Secondary | ICD-10-CM | POA: Diagnosis not present

## 2020-04-01 DIAGNOSIS — Z992 Dependence on renal dialysis: Secondary | ICD-10-CM | POA: Diagnosis not present

## 2020-04-01 DIAGNOSIS — D509 Iron deficiency anemia, unspecified: Secondary | ICD-10-CM | POA: Diagnosis not present

## 2020-04-04 DIAGNOSIS — Z23 Encounter for immunization: Secondary | ICD-10-CM | POA: Diagnosis not present

## 2020-04-04 DIAGNOSIS — D509 Iron deficiency anemia, unspecified: Secondary | ICD-10-CM | POA: Diagnosis not present

## 2020-04-04 DIAGNOSIS — N2581 Secondary hyperparathyroidism of renal origin: Secondary | ICD-10-CM | POA: Diagnosis not present

## 2020-04-04 DIAGNOSIS — Z992 Dependence on renal dialysis: Secondary | ICD-10-CM | POA: Diagnosis not present

## 2020-04-04 DIAGNOSIS — D631 Anemia in chronic kidney disease: Secondary | ICD-10-CM | POA: Diagnosis not present

## 2020-04-04 DIAGNOSIS — N186 End stage renal disease: Secondary | ICD-10-CM | POA: Diagnosis not present

## 2020-04-06 DIAGNOSIS — N186 End stage renal disease: Secondary | ICD-10-CM | POA: Diagnosis not present

## 2020-04-06 DIAGNOSIS — D631 Anemia in chronic kidney disease: Secondary | ICD-10-CM | POA: Diagnosis not present

## 2020-04-06 DIAGNOSIS — Z992 Dependence on renal dialysis: Secondary | ICD-10-CM | POA: Diagnosis not present

## 2020-04-06 DIAGNOSIS — D509 Iron deficiency anemia, unspecified: Secondary | ICD-10-CM | POA: Diagnosis not present

## 2020-04-06 DIAGNOSIS — Z23 Encounter for immunization: Secondary | ICD-10-CM | POA: Diagnosis not present

## 2020-04-06 DIAGNOSIS — N2581 Secondary hyperparathyroidism of renal origin: Secondary | ICD-10-CM | POA: Diagnosis not present

## 2020-04-07 ENCOUNTER — Ambulatory Visit (INDEPENDENT_AMBULATORY_CARE_PROVIDER_SITE_OTHER): Payer: Medicare Other | Admitting: Cardiology

## 2020-04-07 ENCOUNTER — Other Ambulatory Visit: Payer: Self-pay

## 2020-04-07 ENCOUNTER — Encounter: Payer: Self-pay | Admitting: Cardiology

## 2020-04-07 VITALS — BP 102/66 | HR 83 | Ht 63.0 in | Wt 266.0 lb

## 2020-04-07 DIAGNOSIS — I5020 Unspecified systolic (congestive) heart failure: Secondary | ICD-10-CM | POA: Diagnosis not present

## 2020-04-07 DIAGNOSIS — I10 Essential (primary) hypertension: Secondary | ICD-10-CM | POA: Diagnosis not present

## 2020-04-07 DIAGNOSIS — I2510 Atherosclerotic heart disease of native coronary artery without angina pectoris: Secondary | ICD-10-CM | POA: Diagnosis not present

## 2020-04-07 NOTE — Patient Instructions (Signed)
Medication Instructions:  - Your physician recommends that you continue on your current medications as directed. Please refer to the Current Medication list given to you today.  *If you need a refill on your cardiac medications before your next appointment, please call your pharmacy*   Lab Work: - none ordered  If you have labs (blood work) drawn today and your tests are completely normal, you will receive your results only by: [name] Kitchen MyChart Message (if you have MyChart) OR . A paper copy in the mail If you have any lab test that is abnormal or we need to change your treatment, we will call you to review the results.   Testing/Procedures: - none ordered   Follow-Up: At [facility], you and your health needs are our priority.  As part of our continuing mission to provide you with exceptional heart care, we have created designated Provider Care Teams.  These Care Teams include your primary Cardiologist (physician) and Advanced Practice Providers (APPs -  Physician Assistants and Nurse Practitioners) who all work together to provide you with the care you need, when you need it.  We recommend signing up for the patient portal called "MyChart".  Sign up information is provided on this After Visit Summary.  MyChart is used to connect with patients for Virtual Visits (Telemedicine).  Patients are able to view lab/test results, encounter notes, upcoming appointments, etc.  Non-urgent messages can be sent to your provider as well.   To learn more about what you can do with MyChart, go to [email or web address].    Your next appointment:   6 month(s)  The format for your next appointment:   In Person  Provider:   Kate Sable, MD   Other Instructions n/a

## 2020-04-07 NOTE — Progress Notes (Signed)
Cardiology Office Note:    Date:  04/07/2020   ID:  [name], DOB 08-16-1966, MRN [id number]  PCP:  [name] Guise, MD  Cardiologist:  Kate Sable, MD  Electrophysiologist:  None    Chief Complaint  Patient presents with  . Other    6 month follow up. Meds reviewed verbally with patient.     History of Present Illness:    [name] is a 54 y.o. male with a hx of hypertension, diabetes, CKD on dialysis 3 times a week, CAD with 1 stent placed([facility] 2019), congestive heart failure diagnosed 2019, who presents for follow-up.  Last seen for preop eval prior to AV fistula.  He has since been doing well.  Denies chest pain or shortness of breath.  Takes medications as prescribed.  Uses compression stockings to help with edema.   Last echo on 09/2019 showed mildly reduced EF 45 to 50%, Lexiscan 08/2019 did not show any evidence for ischemia.  Detailed report below.  Past Medical History:  Diagnosis Date  . Bell's palsy   . Bell's palsy   . Cataract   . CHF (congestive heart failure) ([place])   . Chronic kidney disease    dialysis  . COPD (chronic obstructive pulmonary disease) ([place])   . Coronary artery disease   . Diabetes mellitus without complication ([place])   . Dyspnea    Wheezing  . GERD (gastroesophageal reflux disease)   . Gout   . HOH (hard of hearing)    mild  . Hypercholesterolemia   . Hypertension   . NSVT (nonsustained ventricular tachycardia) ([place])   . Obstructive sleep apnea    CPAP, O2 use continuosly at 3LPM  . Orthopnea   . Oxygen dependent    3 lpm continuous  . Pneumonia    In Past X2 most recent 1 yr ago  . Psoriasis   . Psoriasis   . Pulmonary HTN ([place])   . Renal failure     Past Surgical History:  Procedure Laterality Date  . AV FISTULA PLACEMENT Right 11/20/2019   Procedure: ARTERIOVENOUS (AV) FISTULA CREATION ( BRACHIAL CEPHALIC );  Surgeon: [name] Cabal, MD;  Location: [facility] ORS;  Service: Vascular;   Laterality: Right;  . CATARACT EXTRACTION W/PHACO Left 12/17/2019   Procedure: CATARACT EXTRACTION PHACO AND INTRAOCULAR LENS PLACEMENT ([place]) LEFT ISTENT INJ DIABETIC;  Surgeon: [name] Bear, MD;  Location: [facility] ORS;  Service: Ophthalmology;  Laterality: Left;  [place] 00:33.2 CDE 2.96 Fluid Pack Lot # [id number]  . CATARACT EXTRACTION W/PHACO Right 02/24/2020   Procedure: CATARACT EXTRACTION PHACO AND INTRAOCULAR LENS PLACEMENT (IOC) RIGHT DIABETIC ISTENT INJ;  Surgeon: [name] Bear, MD;  Location: [facility] ORS;  Service: Ophthalmology;  Laterality: Right;  [place] 00:37.3 CDE 2.88 Fluid Pack Lot # [id number]  . CORONARY ANGIOPLASTY WITH STENT PLACEMENT     stent placement  . DIALYSIS/PERMA CATHETER INSERTION N/A 06/22/2019   Procedure: DIALYSIS/PERMA CATHETER INSERTION;  Surgeon: [name] Huxley, MD;  Location: [facility];  Service: Cardiovascular;  Laterality: N/A;  . INSERTION OF AHMED VALVE Left 12/17/2019   Procedure: INSERTION OF iSTENT;  Surgeon: [name] Bear, MD;  Location: [facility] ORS;  Service: Ophthalmology;  Laterality: Left;    Current Medications: Current Meds  Medication Sig  . albuterol (PROVENTIL HFA;VENTOLIN HFA) 108 (90 Base) MCG/ACT inhaler Inhale 2 puffs into the lungs every 4 (four) hours as needed for wheezing or shortness of breath.  . allopurinol (ZYLOPRIM) 300 MG  tablet Take 1 tablet (300 mg total) by mouth daily.  [name] ELLIPTA 62.5-25 MCG/INH AEPB INHALE 1 PUFF INTO THE LUNGS DAILY. (Patient taking differently: Inhale 1 puff into the lungs daily. )  . aspirin EC 81 MG tablet Take 1 tablet (81 mg total) by mouth daily.  [name] Kitchen atorvastatin (LIPITOR) 40 MG tablet Take 1 tablet (40 mg total) by mouth at bedtime.  . carvedilol (COREG) 25 MG tablet Take 1 tablet (25 mg total) by mouth every 12 (twelve) hours.  . Cholecalciferol (D3-1000) 25 MCG (1000 UT) tablet Take 2 tablets (2,000 Units total) by mouth daily.  . cyclobenzaprine (FLEXERIL) 5 MG tablet Take 1 tablet  (5 mg total) by mouth 2 (two) times daily as needed for muscle spasms.  . Dulaglutide (TRULICITY) 1.5 SH/7.52YO SOPN Inject 1.5 mg into the skin every 7 (seven) days. Monday  . furosemide (LASIX) 40 MG tablet TAKE 1 & 1/2 TABLETS BY MOUTH TWICE DAILY (Patient taking differently: Take 40 mg by mouth 2 (two) times daily. )  . gabapentin (NEURONTIN) 300 MG capsule TAKE (1) CAPSULE BY MOUTH TWICE DAILY. (Patient taking differently: Take 300 mg by mouth at bedtime. )  . GAVILAX 17 GM/SCOOP powder Take 1 Container by mouth daily as needed for mild constipation or moderate constipation.   [name] Kitchen glimepiride (AMARYL) 4 MG tablet Take 4 mg by mouth daily.  . hydrALAZINE (APRESOLINE) 25 MG tablet TAKE 1 TABLET BY MOUTH TWICE DAILY FOR HIGH BLOOD PRESSURE (Patient taking differently: Take 25 mg by mouth in the morning and at bedtime. )  . insulin degludec (TRESIBA FLEXTOUCH) 100 UNIT/ML SOPN FlexTouch Pen Inject 0.16 mLs (16 Units total) into the skin at bedtime.  . isosorbide mononitrate (IMDUR) 60 MG 24 hr tablet TAKE 1 TABLET BY MOUTH ONCE DAILY. (Patient taking differently: Take 60 mg by mouth daily. )  . latanoprost (XALATAN) 0.005 % ophthalmic solution Place 1 drop into both eyes at bedtime.   . Lido-Capsaicin-Men-Methyl Sal (1ST MEDX-PATCH/ LIDOCAINE) 4-0.025-5-20 % PTCH Apply 1 patch topically daily.  . Magnesium Oxide 200 MG TABS Take 1 tablet (200 mg total) by mouth daily. (Patient taking differently: Take 100 mg by mouth daily. )  . metolazone (ZAROXOLYN) 5 MG tablet Take 1 tablet (5 mg total) by mouth daily.  . Multiple Vitamins-Minerals (TAB-A-VITE) TABS TAKE 1 TABLET BY MOUTH ONCE DAILY. (Patient taking differently: Take 1 tablet by mouth daily. )  . multivitamin (RENA-VIT) TABS tablet Take 1 tablet by mouth at bedtime. (Patient taking differently: Take 1 tablet by mouth daily. )  . omeprazole (PRILOSEC) 20 MG capsule Take 1 capsule (20 mg total) by mouth 2 (two) times daily before a meal.  .  oxyCODONE-acetaminophen (PERCOCET) 5-325 MG tablet Take 1-2 tablets by mouth every 6 (six) hours as needed for moderate pain or severe pain.  . SKYRIZI, 150 MG DOSE, 75 MG/0.83ML PSKT Inject 300 mg into the skin See admin instructions. Inject 300 mg into the skin every 4 months  . traZODone (DESYREL) 100 MG tablet Take one or one and half tab at night for sleep  . triamcinolone cream (KENALOG) 0.1 % Apply 1 application topically as directed. Qd up to 5 days a week to aa psoriasis on body, avoid face, groin, axilla  . VIGAMOX 0.5 % ophthalmic solution      Allergies:   Shellfish allergy and Betadine [povidone iodine]   Social History   Socioeconomic History  . Marital status: Single    Spouse name: Not on  file  . Number of children: Not on file  . Years of education: Not on file  . Highest education level: Not on file  Occupational History    Comment: disabled  Tobacco Use  . Smoking status: Never Smoker  . Smokeless tobacco: Never Used  Substance and Sexual Activity  . Alcohol use: Not Currently    Alcohol/week: 1.0 standard drinks    Types: 1 Cans of beer per week    Comment: occasional  . Drug use: Not Currently    Types: Marijuana    Comment: in high school   . Sexual activity: Not on file  Other Topics Concern  . Not on file  Social History Narrative   Patient uses continuous oxygen at 3L/min   Dialysis, AV fistula in L arm   Social Determinants of Health   Financial Resource Strain:   . Difficulty of Paying Living Expenses:   Food Insecurity:   . Worried About Charity fundraiser in the Last Year:   . Arboriculturist in the Last Year:   Transportation Needs:   . Film/video editor (Medical):   [name] Kitchen Lack of Transportation (Non-Medical):   Physical Activity:   . Days of Exercise per Week:   . Minutes of Exercise per Session:   Stress:   . Feeling of Stress :   Social Connections:   . Frequency of Communication with Friends and Family:   . Frequency of Social  Gatherings with Friends and Family:   . Attends Religious Services:   . Active Member of Clubs or Organizations:   . Attends Archivist Meetings:   [name] Kitchen Marital Status:      Family History: The patient's family history includes Heart failure in his mother; Kidney failure in his brother.  ROS:   Please see the history of present illness.     All other systems reviewed and are negative.  EKGs/Labs/Other Studies Reviewed:    The following studies were reviewed today: Echocardiogram date 09/29/2019 1. Left ventricular ejection fraction, by visual estimation, is 45 to 50%. The left ventricle has mildly decreased function. Mildly increased left ventricular size. There is moderately increased left ventricular hypertrophy.  2. Elevated mean left atrial pressure.  3. Left ventricular diastolic Doppler parameters are consistent with pseudonormalization pattern of LV diastolic filling.  4. Global right ventricle has normal systolic function.The right ventricular size is not well visualized. No increase in right ventricular wall thickness.  5. Left atrial size was mildly dilated.  6. Right atrial size was not well visualized.  7. The pericardium was not well visualized.  8. The mitral valve is grossly normal. Trace mitral valve regurgitation.  9. The tricuspid valve is not well visualized. Tricuspid valve regurgitation is trivial. 10. The aortic valve was not well visualized Aortic valve regurgitation was not visualized by color flow Doppler. There is no aortic valve stenosis. 11. The pulmonic valve was not well visualized. Pulmonic valve regurgitation is not visualized by color flow Doppler. 12. TR signal is inadequate for assessing pulmonary artery systolic pressure. 13. The inferior vena cava is normal in size with greater than 50% respiratory variability, suggesting right atrial pressure of 3 mmHg. 14. The interatrial septum was not well visualized.  Lexiscan myocardial perfusion  imaging test 09/10/2019 Pharmacological myocardial perfusion imaging study with no significant  Ischemia Small region fixed apical defect, likely secondary to attenuation artifact though unable to exclude small region of old infarct GI uptake artifact noted Mild global hypokinesis,  EF estimated at 35% (depressed EF possibly exaggerated by GI uptake artifact) No EKG changes concerning for ischemia at peak stress or in recovery. Resting EKG with nonspecific ST abnormality CT attenuation corrected images with significant three-vessel coronary calcification, mild aortic atherosclerosis Low risk scan.    EKG:  EKG is  ordered today.  The ekg ordered today demonstrates normal sinus rhythm, QT 488  Recent Labs: 06/15/2019: ALT 11; B Natriuretic Peptide 173.0 06/25/2019: Magnesium 1.9 11/21/2019: Platelets 207 02/24/2020: BUN 26; Creatinine, Ser 3.80; Hemoglobin 12.9; Potassium 3.9; Sodium 138  Recent Lipid Panel    Component Value Date/Time   CHOL 116 04/21/2019 1334   TRIG 81 04/21/2019 1334   HDL 37 (L) 04/21/2019 1334   CHOLHDL 3.7 10/08/2018 0955   LDLCALC 63 04/21/2019 1334    Physical Exam:    VS:  BP 102/66 (BP Location: Left Arm, Patient Position: Sitting, Cuff Size: Large)   Pulse 83   Ht 5\' 3"  (1.6 m)   Wt 266 lb (120.7 kg)   SpO2 (!) 84% Comment: Turning O2 on now.  BMI 47.12 kg/m     Wt Readings from Last 3 Encounters:  04/07/20 266 lb (120.7 kg)  03/29/20 264 lb (119.7 kg)  03/22/20 266 lb 6.4 oz (120.8 kg)     GEN:  Well nourished, well developed in no acute distress, nasal canula HEENT: Normal NECK: No JVD; No carotid bruits LYMPHATICS: No lymphadenopathy CARDIAC: RRR, no murmurs, rubs, gallops RESPIRATORY:  Clear to auscultation without rales, wheezing or rhonchi, decreased breath sounds at bases. ABDOMEN: Soft, non-tender, distended MUSCULOSKELETAL:  No edema; No deformity  SKIN: Warm and dry NEUROLOGIC:  Alert and oriented x 3 PSYCHIATRIC:  Normal affect    ASSESSMENT:     1. Coronary artery disease involving native coronary artery of native heart without angina pectoris   2. Systolic heart failure, unspecified HF chronicity ([place])   3. Essential hypertension    PLAN:     1. Patient with history of CAD status post PCI.  Denies chest pain.  Continue aspirin, Lipitor as currently prescribed 2. History of heart failure, mildly reduced EF, continue heart failure medical regimen, Coreg, hydralazine, Lasix. 3. History of hypertension, blood pressures well controlled.  Follow-up in 6 months.   Medication Adjustments/Labs and Tests Ordered: Current medicines are reviewed at length with the patient today.  Concerns regarding medicines are outlined above.  Orders Placed This Encounter  Procedures  . EKG 12-Lead   No orders of the defined types were placed in this encounter.   Patient Instructions  Medication Instructions:  - Your physician recommends that you continue on your current medications as directed. Please refer to the Current Medication list given to you today.  *If you need a refill on your cardiac medications before your next appointment, please call your pharmacy*   Lab Work: - none ordered  If you have labs (blood work) drawn today and your tests are completely normal, you will receive your results only by: [name] Kitchen MyChart Message (if you have MyChart) OR . A paper copy in the mail If you have any lab test that is abnormal or we need to change your treatment, we will call you to review the results.   Testing/Procedures: - none ordered   Follow-Up: At [facility], you and your health needs are our priority.  As part of our continuing mission to provide you with exceptional heart care, we have created designated Provider Care Teams.  These Care Teams include your  primary Cardiologist (physician) and Advanced Practice Providers (APPs -  Physician Assistants and Nurse Practitioners) who all work together to provide you  with the care you need, when you need it.  We recommend signing up for the patient portal called "MyChart".  Sign up information is provided on this After Visit Summary.  MyChart is used to connect with patients for Virtual Visits (Telemedicine).  Patients are able to view lab/test results, encounter notes, upcoming appointments, etc.  Non-urgent messages can be sent to your provider as well.   To learn more about what you can do with MyChart, go to [email or web address].    Your next appointment:   6 month(s)  The format for your next appointment:   In Person  Provider:   Kate Sable, MD   Other Instructions n/a     Signed, Kate Sable, MD  04/07/2020 12:25 PM    [place]

## 2020-04-08 ENCOUNTER — Telehealth: Payer: Self-pay

## 2020-04-08 DIAGNOSIS — Z992 Dependence on renal dialysis: Secondary | ICD-10-CM | POA: Diagnosis not present

## 2020-04-08 DIAGNOSIS — N186 End stage renal disease: Secondary | ICD-10-CM | POA: Diagnosis not present

## 2020-04-08 DIAGNOSIS — D631 Anemia in chronic kidney disease: Secondary | ICD-10-CM | POA: Diagnosis not present

## 2020-04-08 DIAGNOSIS — N2581 Secondary hyperparathyroidism of renal origin: Secondary | ICD-10-CM | POA: Diagnosis not present

## 2020-04-08 DIAGNOSIS — D509 Iron deficiency anemia, unspecified: Secondary | ICD-10-CM | POA: Diagnosis not present

## 2020-04-08 DIAGNOSIS — Z23 Encounter for immunization: Secondary | ICD-10-CM | POA: Diagnosis not present

## 2020-04-08 NOTE — Telephone Encounter (Signed)
Received a CMN from [facility] via fax for patient for oxygen. Faxed order back stating patient did not use this company for their oxygen.

## 2020-04-11 DIAGNOSIS — D509 Iron deficiency anemia, unspecified: Secondary | ICD-10-CM | POA: Diagnosis not present

## 2020-04-11 DIAGNOSIS — Z992 Dependence on renal dialysis: Secondary | ICD-10-CM | POA: Diagnosis not present

## 2020-04-11 DIAGNOSIS — D631 Anemia in chronic kidney disease: Secondary | ICD-10-CM | POA: Diagnosis not present

## 2020-04-11 DIAGNOSIS — N186 End stage renal disease: Secondary | ICD-10-CM | POA: Diagnosis not present

## 2020-04-11 DIAGNOSIS — Z23 Encounter for immunization: Secondary | ICD-10-CM | POA: Diagnosis not present

## 2020-04-11 DIAGNOSIS — N2581 Secondary hyperparathyroidism of renal origin: Secondary | ICD-10-CM | POA: Diagnosis not present

## 2020-04-13 DIAGNOSIS — N2581 Secondary hyperparathyroidism of renal origin: Secondary | ICD-10-CM | POA: Diagnosis not present

## 2020-04-13 DIAGNOSIS — D631 Anemia in chronic kidney disease: Secondary | ICD-10-CM | POA: Diagnosis not present

## 2020-04-13 DIAGNOSIS — N186 End stage renal disease: Secondary | ICD-10-CM | POA: Diagnosis not present

## 2020-04-13 DIAGNOSIS — D509 Iron deficiency anemia, unspecified: Secondary | ICD-10-CM | POA: Diagnosis not present

## 2020-04-13 DIAGNOSIS — Z992 Dependence on renal dialysis: Secondary | ICD-10-CM | POA: Diagnosis not present

## 2020-04-13 DIAGNOSIS — Z23 Encounter for immunization: Secondary | ICD-10-CM | POA: Diagnosis not present

## 2020-04-14 ENCOUNTER — Other Ambulatory Visit: Payer: Self-pay | Admitting: Adult Health

## 2020-04-14 DIAGNOSIS — I270 Primary pulmonary hypertension: Secondary | ICD-10-CM

## 2020-04-15 DIAGNOSIS — N2581 Secondary hyperparathyroidism of renal origin: Secondary | ICD-10-CM | POA: Diagnosis not present

## 2020-04-15 DIAGNOSIS — D509 Iron deficiency anemia, unspecified: Secondary | ICD-10-CM | POA: Diagnosis not present

## 2020-04-15 DIAGNOSIS — N186 End stage renal disease: Secondary | ICD-10-CM | POA: Diagnosis not present

## 2020-04-15 DIAGNOSIS — D631 Anemia in chronic kidney disease: Secondary | ICD-10-CM | POA: Diagnosis not present

## 2020-04-15 DIAGNOSIS — Z992 Dependence on renal dialysis: Secondary | ICD-10-CM | POA: Diagnosis not present

## 2020-04-15 DIAGNOSIS — Z23 Encounter for immunization: Secondary | ICD-10-CM | POA: Diagnosis not present

## 2020-04-18 DIAGNOSIS — D631 Anemia in chronic kidney disease: Secondary | ICD-10-CM | POA: Diagnosis not present

## 2020-04-18 DIAGNOSIS — N2581 Secondary hyperparathyroidism of renal origin: Secondary | ICD-10-CM | POA: Diagnosis not present

## 2020-04-18 DIAGNOSIS — N186 End stage renal disease: Secondary | ICD-10-CM | POA: Diagnosis not present

## 2020-04-18 DIAGNOSIS — Z992 Dependence on renal dialysis: Secondary | ICD-10-CM | POA: Diagnosis not present

## 2020-04-18 DIAGNOSIS — D509 Iron deficiency anemia, unspecified: Secondary | ICD-10-CM | POA: Diagnosis not present

## 2020-04-19 ENCOUNTER — Telehealth: Payer: Self-pay

## 2020-04-19 NOTE — Telephone Encounter (Signed)
Patient in home aid forms ready for pick up. [name]

## 2020-04-20 ENCOUNTER — Telehealth: Payer: Self-pay

## 2020-04-20 DIAGNOSIS — D631 Anemia in chronic kidney disease: Secondary | ICD-10-CM | POA: Diagnosis not present

## 2020-04-20 DIAGNOSIS — N186 End stage renal disease: Secondary | ICD-10-CM | POA: Diagnosis not present

## 2020-04-20 DIAGNOSIS — Z992 Dependence on renal dialysis: Secondary | ICD-10-CM | POA: Diagnosis not present

## 2020-04-20 DIAGNOSIS — D509 Iron deficiency anemia, unspecified: Secondary | ICD-10-CM | POA: Diagnosis not present

## 2020-04-20 DIAGNOSIS — N2581 Secondary hyperparathyroidism of renal origin: Secondary | ICD-10-CM | POA: Diagnosis not present

## 2020-04-20 NOTE — Telephone Encounter (Signed)
Confirmed appointment on 05/011/2021 and screened for covid. [facility]

## 2020-04-20 NOTE — Telephone Encounter (Signed)
Pt called that he like to switch company due to not giving him portable oxygen I spoke with [facility] and make sure they help him until his appt and also advised pt any time any problem with oxygen try to call company if not call us

## 2020-04-21 ENCOUNTER — Other Ambulatory Visit: Payer: Self-pay

## 2020-04-21 ENCOUNTER — Encounter: Payer: Self-pay | Admitting: Podiatry

## 2020-04-21 ENCOUNTER — Ambulatory Visit (INDEPENDENT_AMBULATORY_CARE_PROVIDER_SITE_OTHER): Payer: Medicare Other | Admitting: Podiatry

## 2020-04-21 VITALS — Temp 97.7°F

## 2020-04-21 DIAGNOSIS — E1142 Type 2 diabetes mellitus with diabetic polyneuropathy: Secondary | ICD-10-CM | POA: Diagnosis not present

## 2020-04-21 DIAGNOSIS — B351 Tinea unguium: Secondary | ICD-10-CM | POA: Diagnosis not present

## 2020-04-21 DIAGNOSIS — M79676 Pain in unspecified toe(s): Secondary | ICD-10-CM | POA: Diagnosis not present

## 2020-04-21 NOTE — Progress Notes (Signed)
This patient returns to my office for at risk foot care.  This patient requires this care by a professional since this patient will be at risk due to having diabetes mellitus and ESRD.  This patient is unable to cut nails himself since the patient cannot reach his nails.These nails are painful walking and wearing shoes.  This patient presents for at risk foot care today.  General Appearance  Alert, conversant and in no acute stress.  Vascular  Dorsalis pedis and posterior tibial  pulses are weakly  palpable due to swelling. bilaterally.  Capillary return is within normal limits  bilaterally. Temperature is within normal limits  Bilaterally.  Venous stasis  B/L.  Neurologic  Senn-Weinstein monofilament wire test within normal limits  bilaterally. Muscle power within normal limits bilaterally.  Nails Thick disfigured discolored nails with subungual debris  from hallux to fifth toes bilaterally. No evidence of bacterial infection or drainage bilaterally.  Orthopedic  No limitations of motion  feet .  No crepitus or effusions noted.  No bony pathology or digital deformities noted.  Skin  normotropic skin with no porokeratosis noted bilaterally.  No signs of infections or ulcers noted.  Punctate psoriasis plantar aspect feet  B/l.   Onychomycosis  Pain in right toes  Pain in left toes  Consent was obtained for treatment procedures.   Mechanical debridement of nails 1-5  bilaterally performed with a nail nipper.  Filed with dremel without incident.    Return office visit  3 months                    Told patient to return for periodic foot care and evaluation due to potential at risk complications.   Gardiner Barefoot DPM

## 2020-04-22 ENCOUNTER — Telehealth: Payer: Self-pay

## 2020-04-22 NOTE — Telephone Encounter (Signed)
Spoke with [name] at [employer] [facility] and they have put orders in for oxygen delivery as well as best fit evaluation with respiratory tech to see pt can tolerate shoulder bag portable tanks. [name]

## 2020-04-25 DIAGNOSIS — D631 Anemia in chronic kidney disease: Secondary | ICD-10-CM | POA: Diagnosis not present

## 2020-04-25 DIAGNOSIS — Z992 Dependence on renal dialysis: Secondary | ICD-10-CM | POA: Diagnosis not present

## 2020-04-25 DIAGNOSIS — N2581 Secondary hyperparathyroidism of renal origin: Secondary | ICD-10-CM | POA: Diagnosis not present

## 2020-04-25 DIAGNOSIS — D509 Iron deficiency anemia, unspecified: Secondary | ICD-10-CM | POA: Diagnosis not present

## 2020-04-25 DIAGNOSIS — N186 End stage renal disease: Secondary | ICD-10-CM | POA: Diagnosis not present

## 2020-04-26 ENCOUNTER — Ambulatory Visit: Payer: Medicare Other | Admitting: Adult Health

## 2020-04-27 DIAGNOSIS — N186 End stage renal disease: Secondary | ICD-10-CM | POA: Diagnosis not present

## 2020-04-27 DIAGNOSIS — D631 Anemia in chronic kidney disease: Secondary | ICD-10-CM | POA: Diagnosis not present

## 2020-04-27 DIAGNOSIS — N2581 Secondary hyperparathyroidism of renal origin: Secondary | ICD-10-CM | POA: Diagnosis not present

## 2020-04-27 DIAGNOSIS — Z992 Dependence on renal dialysis: Secondary | ICD-10-CM | POA: Diagnosis not present

## 2020-04-27 DIAGNOSIS — D509 Iron deficiency anemia, unspecified: Secondary | ICD-10-CM | POA: Diagnosis not present

## 2020-04-29 DIAGNOSIS — N2581 Secondary hyperparathyroidism of renal origin: Secondary | ICD-10-CM | POA: Diagnosis not present

## 2020-04-29 DIAGNOSIS — Z992 Dependence on renal dialysis: Secondary | ICD-10-CM | POA: Diagnosis not present

## 2020-04-29 DIAGNOSIS — D509 Iron deficiency anemia, unspecified: Secondary | ICD-10-CM | POA: Diagnosis not present

## 2020-04-29 DIAGNOSIS — D631 Anemia in chronic kidney disease: Secondary | ICD-10-CM | POA: Diagnosis not present

## 2020-04-29 DIAGNOSIS — N186 End stage renal disease: Secondary | ICD-10-CM | POA: Diagnosis not present

## 2020-05-02 DIAGNOSIS — Z992 Dependence on renal dialysis: Secondary | ICD-10-CM | POA: Diagnosis not present

## 2020-05-02 DIAGNOSIS — D509 Iron deficiency anemia, unspecified: Secondary | ICD-10-CM | POA: Diagnosis not present

## 2020-05-02 DIAGNOSIS — N2581 Secondary hyperparathyroidism of renal origin: Secondary | ICD-10-CM | POA: Diagnosis not present

## 2020-05-02 DIAGNOSIS — N186 End stage renal disease: Secondary | ICD-10-CM | POA: Diagnosis not present

## 2020-05-02 DIAGNOSIS — D631 Anemia in chronic kidney disease: Secondary | ICD-10-CM | POA: Diagnosis not present

## 2020-05-04 DIAGNOSIS — D631 Anemia in chronic kidney disease: Secondary | ICD-10-CM | POA: Diagnosis not present

## 2020-05-04 DIAGNOSIS — Z992 Dependence on renal dialysis: Secondary | ICD-10-CM | POA: Diagnosis not present

## 2020-05-04 DIAGNOSIS — D509 Iron deficiency anemia, unspecified: Secondary | ICD-10-CM | POA: Diagnosis not present

## 2020-05-04 DIAGNOSIS — N2581 Secondary hyperparathyroidism of renal origin: Secondary | ICD-10-CM | POA: Diagnosis not present

## 2020-05-04 DIAGNOSIS — N186 End stage renal disease: Secondary | ICD-10-CM | POA: Diagnosis not present

## 2020-05-06 DIAGNOSIS — D631 Anemia in chronic kidney disease: Secondary | ICD-10-CM | POA: Diagnosis not present

## 2020-05-06 DIAGNOSIS — Z992 Dependence on renal dialysis: Secondary | ICD-10-CM | POA: Diagnosis not present

## 2020-05-06 DIAGNOSIS — N186 End stage renal disease: Secondary | ICD-10-CM | POA: Diagnosis not present

## 2020-05-06 DIAGNOSIS — D509 Iron deficiency anemia, unspecified: Secondary | ICD-10-CM | POA: Diagnosis not present

## 2020-05-06 DIAGNOSIS — N2581 Secondary hyperparathyroidism of renal origin: Secondary | ICD-10-CM | POA: Diagnosis not present

## 2020-05-09 DIAGNOSIS — Z992 Dependence on renal dialysis: Secondary | ICD-10-CM | POA: Diagnosis not present

## 2020-05-09 DIAGNOSIS — N186 End stage renal disease: Secondary | ICD-10-CM | POA: Diagnosis not present

## 2020-05-09 DIAGNOSIS — D631 Anemia in chronic kidney disease: Secondary | ICD-10-CM | POA: Diagnosis not present

## 2020-05-09 DIAGNOSIS — D509 Iron deficiency anemia, unspecified: Secondary | ICD-10-CM | POA: Diagnosis not present

## 2020-05-09 DIAGNOSIS — N2581 Secondary hyperparathyroidism of renal origin: Secondary | ICD-10-CM | POA: Diagnosis not present

## 2020-05-11 ENCOUNTER — Telehealth (INDEPENDENT_AMBULATORY_CARE_PROVIDER_SITE_OTHER): Payer: Self-pay

## 2020-05-11 DIAGNOSIS — Z992 Dependence on renal dialysis: Secondary | ICD-10-CM | POA: Diagnosis not present

## 2020-05-11 DIAGNOSIS — D631 Anemia in chronic kidney disease: Secondary | ICD-10-CM | POA: Diagnosis not present

## 2020-05-11 DIAGNOSIS — D509 Iron deficiency anemia, unspecified: Secondary | ICD-10-CM | POA: Diagnosis not present

## 2020-05-11 DIAGNOSIS — N2581 Secondary hyperparathyroidism of renal origin: Secondary | ICD-10-CM | POA: Diagnosis not present

## 2020-05-11 DIAGNOSIS — N186 End stage renal disease: Secondary | ICD-10-CM | POA: Diagnosis not present

## 2020-05-11 NOTE — Telephone Encounter (Signed)
A fax was received from [place] at [facility] for the patient to have a permcath removal. Patient is scheduled with Dr. Delana Meyer on 05/17/20 with a 12:15 pm arrival time to the [facility]. Patient will do covid testing on 05-13-20 between 8-1 pm a the [facility]. Pre-procedure instructions will be faxed back to [facility].

## 2020-05-13 ENCOUNTER — Other Ambulatory Visit
Admission: RE | Admit: 2020-05-13 | Discharge: 2020-05-13 | Disposition: A | Discharge status: Home / Self Care | Payer: Medicare Other | Source: Ambulatory Visit | Attending: Vascular Surgery | Admitting: Vascular Surgery

## 2020-05-13 DIAGNOSIS — D631 Anemia in chronic kidney disease: Secondary | ICD-10-CM | POA: Diagnosis not present

## 2020-05-13 DIAGNOSIS — Z992 Dependence on renal dialysis: Secondary | ICD-10-CM | POA: Diagnosis not present

## 2020-05-13 DIAGNOSIS — N2581 Secondary hyperparathyroidism of renal origin: Secondary | ICD-10-CM | POA: Diagnosis not present

## 2020-05-13 DIAGNOSIS — N186 End stage renal disease: Secondary | ICD-10-CM | POA: Diagnosis not present

## 2020-05-13 DIAGNOSIS — D509 Iron deficiency anemia, unspecified: Secondary | ICD-10-CM | POA: Diagnosis not present

## 2020-05-13 NOTE — Progress Notes (Signed)
No show at covid testing site today 

## 2020-05-16 ENCOUNTER — Other Ambulatory Visit (INDEPENDENT_AMBULATORY_CARE_PROVIDER_SITE_OTHER): Payer: Self-pay | Admitting: Nurse Practitioner

## 2020-05-16 DIAGNOSIS — N186 End stage renal disease: Secondary | ICD-10-CM | POA: Diagnosis not present

## 2020-05-16 DIAGNOSIS — Z992 Dependence on renal dialysis: Secondary | ICD-10-CM | POA: Diagnosis not present

## 2020-05-17 ENCOUNTER — Encounter: Admission: RE | Payer: Self-pay | Source: Home / Self Care

## 2020-05-17 ENCOUNTER — Ambulatory Visit: Admission: RE | Admit: 2020-05-17 | Payer: Medicare Other | Source: Home / Self Care | Admitting: Vascular Surgery

## 2020-05-17 SURGERY — DIALYSIS/PERMA CATHETER REMOVAL
Anesthesia: LOCAL

## 2020-05-18 ENCOUNTER — Other Ambulatory Visit: Payer: Self-pay | Admitting: Adult Health

## 2020-05-18 ENCOUNTER — Telehealth: Payer: Self-pay

## 2020-05-18 NOTE — Telephone Encounter (Signed)
Spoke with [employer] [facility] and they will reach out to pt and get him more refill tanks due his weekly dialysis, they will also re educate him on refilling his tanks from his home concentrator. Beth

## 2020-05-24 ENCOUNTER — Telehealth (INDEPENDENT_AMBULATORY_CARE_PROVIDER_SITE_OTHER): Payer: Self-pay

## 2020-05-24 NOTE — Telephone Encounter (Signed)
Mark from [employer] called wanting to reschedule the patient for a permcath  Removal. Patient has been rescheduled with Dr. Delana Meyer to 05/31/20 with a 10:30 am arrival time to the [facility]. Patient will do covid testing on 05/27/20 between 8-1 pm at the [facility]. Pre-procedure instructions will be faxed to [facility] at [facility].

## 2020-05-26 ENCOUNTER — Ambulatory Visit (INDEPENDENT_AMBULATORY_CARE_PROVIDER_SITE_OTHER): Payer: Medicare Other | Admitting: Vascular Surgery

## 2020-05-26 ENCOUNTER — Ambulatory Visit (INDEPENDENT_AMBULATORY_CARE_PROVIDER_SITE_OTHER): Payer: Medicare Other

## 2020-05-26 ENCOUNTER — Encounter (INDEPENDENT_AMBULATORY_CARE_PROVIDER_SITE_OTHER): Payer: Self-pay | Admitting: Vascular Surgery

## 2020-05-26 ENCOUNTER — Other Ambulatory Visit: Payer: Self-pay

## 2020-05-26 VITALS — BP 113/70 | HR 80 | Ht 64.0 in | Wt 276.0 lb

## 2020-05-26 DIAGNOSIS — T829XXS Unspecified complication of cardiac and vascular prosthetic device, implant and graft, sequela: Secondary | ICD-10-CM | POA: Diagnosis not present

## 2020-05-26 DIAGNOSIS — I2510 Atherosclerotic heart disease of native coronary artery without angina pectoris: Secondary | ICD-10-CM | POA: Diagnosis not present

## 2020-05-26 DIAGNOSIS — N186 End stage renal disease: Secondary | ICD-10-CM | POA: Diagnosis not present

## 2020-05-26 DIAGNOSIS — E1142 Type 2 diabetes mellitus with diabetic polyneuropathy: Secondary | ICD-10-CM

## 2020-05-26 DIAGNOSIS — I10 Essential (primary) hypertension: Secondary | ICD-10-CM

## 2020-05-27 ENCOUNTER — Other Ambulatory Visit
Admission: RE | Admit: 2020-05-27 | Discharge: 2020-05-27 | Disposition: A | Discharge status: Home / Self Care | Payer: Medicare Other | Source: Ambulatory Visit | Attending: Vascular Surgery | Admitting: Vascular Surgery

## 2020-05-27 ENCOUNTER — Telehealth (INDEPENDENT_AMBULATORY_CARE_PROVIDER_SITE_OTHER): Payer: Self-pay

## 2020-05-27 DIAGNOSIS — Z01812 Encounter for preprocedural laboratory examination: Secondary | ICD-10-CM | POA: Insufficient documentation

## 2020-05-27 DIAGNOSIS — Z20822 Contact with and (suspected) exposure to covid-19: Secondary | ICD-10-CM | POA: Diagnosis not present

## 2020-05-27 LAB — SARS CORONAVIRUS 2 (TAT 6-24 HRS): SARS Coronavirus 2: NEGATIVE

## 2020-05-27 NOTE — Telephone Encounter (Signed)
I contacted Mark at [employer] today to give Elta Guadeloupe the updated angio procedure for the patient. Patient was initially scheduled for a permcath removal on 05/31/20 with Dr. Delana Meyer, patient was seen 05/26/20 and per Dr. Delana Meyer he needs a fistulagram. Patient was scheduled for a right arm fistulagram on 05/31/20 with a 8:00 am arrival time to the [facility]. Patient will do his covid today on 05/27/20 between 8-1 pm at the [facility].

## 2020-05-27 NOTE — Telephone Encounter (Signed)
The pt called and left a message on the nurses line  I returned the call  to the pt. The pt made me aware that he is haveing surgery on 6/15 a fistulagram and would like to have the pain medicine for his post Op sent to his pharmacy due to his pharmacy being closed on Monday, wensdsdays and fridays.

## 2020-05-30 ENCOUNTER — Other Ambulatory Visit (INDEPENDENT_AMBULATORY_CARE_PROVIDER_SITE_OTHER): Payer: Self-pay | Admitting: Nurse Practitioner

## 2020-05-30 MED ORDER — CEFAZOLIN SODIUM-DEXTROSE 1-4 GM/50ML-% IV SOLN: 1.0000 g | Freq: Once | INTRAVENOUS | Status: DC

## 2020-05-30 NOTE — Telephone Encounter (Signed)
That is unfortunate, however we do not prescribe pain medication prior to interventions.  This is partly due to the stringent government regulations that don't allow [place] to prescribe pain medication without due cause, in this case that would be post procedure.  Also, we typically will give tramadol and in rare cases, hydrocodone, post procedure if needed, but again the level of pain medication must match the intervention that was done.  Percocet is typically reserved for much larger, invasive procedure with high degrees of pain expected.  We can also send the Rx to a different pharmacy that will be open post procedure, reschedule it to a day that his pharmacy will be open, or the patient could wait until his pharmacy opens to pick up post procedure pain medication.  Ultimately, it is the patient's decision if he wishes to move forward with his procedure.

## 2020-05-30 NOTE — Telephone Encounter (Signed)
I called the pt and made him aware and he said that he does not want tramadol or OTC he would like to cancel the fistulagram

## 2020-05-30 NOTE — Telephone Encounter (Signed)
We typically do not send pain medicine prior to intervention.  Also, fistulogram's generally are treated with over-the-counter pain relief, such as Tylenol.

## 2020-05-30 NOTE — Telephone Encounter (Signed)
I called and left a voice mail for the patient making him aware of the NP's instructions.

## 2020-05-30 NOTE — Telephone Encounter (Signed)
The pt called back and made me aware that he will not be having his procedure with out prescribed medication for pain that for after his procedure and that  OTC medication does not work for him. That previously when he had this procedure he had to have percocet called in.

## 2020-05-31 ENCOUNTER — Ambulatory Visit
Admission: RE | Admit: 2020-05-31 | Discharge: 2020-05-31 | Disposition: A | Discharge status: Home / Self Care | Payer: Medicare Other | Attending: Vascular Surgery | Admitting: Vascular Surgery

## 2020-05-31 ENCOUNTER — Other Ambulatory Visit (INDEPENDENT_AMBULATORY_CARE_PROVIDER_SITE_OTHER): Payer: Self-pay | Admitting: Nurse Practitioner

## 2020-05-31 ENCOUNTER — Encounter (INDEPENDENT_AMBULATORY_CARE_PROVIDER_SITE_OTHER): Payer: Self-pay | Admitting: Vascular Surgery

## 2020-05-31 ENCOUNTER — Other Ambulatory Visit: Payer: Self-pay

## 2020-05-31 ENCOUNTER — Encounter
Admission: RE | Disposition: A | Discharge status: Home / Self Care | Payer: Self-pay | Source: Home / Self Care | Attending: Vascular Surgery

## 2020-05-31 DIAGNOSIS — E1142 Type 2 diabetes mellitus with diabetic polyneuropathy: Secondary | ICD-10-CM | POA: Insufficient documentation

## 2020-05-31 DIAGNOSIS — Z95828 Presence of other vascular implants and grafts: Secondary | ICD-10-CM | POA: Diagnosis not present

## 2020-05-31 DIAGNOSIS — I509 Heart failure, unspecified: Secondary | ICD-10-CM | POA: Diagnosis not present

## 2020-05-31 DIAGNOSIS — I272 Pulmonary hypertension, unspecified: Secondary | ICD-10-CM | POA: Diagnosis not present

## 2020-05-31 DIAGNOSIS — E1122 Type 2 diabetes mellitus with diabetic chronic kidney disease: Secondary | ICD-10-CM | POA: Insufficient documentation

## 2020-05-31 DIAGNOSIS — M109 Gout, unspecified: Secondary | ICD-10-CM | POA: Diagnosis not present

## 2020-05-31 DIAGNOSIS — N179 Acute kidney failure, unspecified: Secondary | ICD-10-CM | POA: Insufficient documentation

## 2020-05-31 DIAGNOSIS — J449 Chronic obstructive pulmonary disease, unspecified: Secondary | ICD-10-CM | POA: Insufficient documentation

## 2020-05-31 DIAGNOSIS — Z79899 Other long term (current) drug therapy: Secondary | ICD-10-CM | POA: Insufficient documentation

## 2020-05-31 DIAGNOSIS — Z4901 Encounter for fitting and adjustment of extracorporeal dialysis catheter: Secondary | ICD-10-CM | POA: Insufficient documentation

## 2020-05-31 DIAGNOSIS — Z9981 Dependence on supplemental oxygen: Secondary | ICD-10-CM | POA: Diagnosis not present

## 2020-05-31 DIAGNOSIS — Z955 Presence of coronary angioplasty implant and graft: Secondary | ICD-10-CM | POA: Diagnosis not present

## 2020-05-31 DIAGNOSIS — Z841 Family history of disorders of kidney and ureter: Secondary | ICD-10-CM | POA: Diagnosis not present

## 2020-05-31 DIAGNOSIS — Z888 Allergy status to other drugs, medicaments and biological substances status: Secondary | ICD-10-CM | POA: Insufficient documentation

## 2020-05-31 DIAGNOSIS — N186 End stage renal disease: Secondary | ICD-10-CM | POA: Diagnosis not present

## 2020-05-31 DIAGNOSIS — Z8249 Family history of ischemic heart disease and other diseases of the circulatory system: Secondary | ICD-10-CM | POA: Insufficient documentation

## 2020-05-31 DIAGNOSIS — Z992 Dependence on renal dialysis: Secondary | ICD-10-CM | POA: Diagnosis not present

## 2020-05-31 DIAGNOSIS — G510 Bell's palsy: Secondary | ICD-10-CM | POA: Diagnosis not present

## 2020-05-31 DIAGNOSIS — K219 Gastro-esophageal reflux disease without esophagitis: Secondary | ICD-10-CM | POA: Insufficient documentation

## 2020-05-31 DIAGNOSIS — Z794 Long term (current) use of insulin: Secondary | ICD-10-CM | POA: Insufficient documentation

## 2020-05-31 DIAGNOSIS — I2510 Atherosclerotic heart disease of native coronary artery without angina pectoris: Secondary | ICD-10-CM | POA: Insufficient documentation

## 2020-05-31 DIAGNOSIS — G4733 Obstructive sleep apnea (adult) (pediatric): Secondary | ICD-10-CM | POA: Insufficient documentation

## 2020-05-31 DIAGNOSIS — Z7982 Long term (current) use of aspirin: Secondary | ICD-10-CM | POA: Diagnosis not present

## 2020-05-31 DIAGNOSIS — I132 Hypertensive heart and chronic kidney disease with heart failure and with stage 5 chronic kidney disease, or end stage renal disease: Secondary | ICD-10-CM | POA: Diagnosis not present

## 2020-05-31 DIAGNOSIS — E7800 Pure hypercholesterolemia, unspecified: Secondary | ICD-10-CM | POA: Diagnosis not present

## 2020-05-31 HISTORY — PX: DIALYSIS/PERMA CATHETER REMOVAL: CATH118289

## 2020-05-31 SURGERY — DIALYSIS/PERMA CATHETER REMOVAL
Laterality: Right

## 2020-05-31 SURGICAL SUPPLY — 1 items: TRAY LACERAT/PLASTIC (MISCELLANEOUS) ×3 IMPLANT

## 2020-05-31 NOTE — Op Note (Addendum)
Operative Note  Preoperative diagnosis:    1. ESRD with functional permanent access  Postoperative diagnosis:   1. ESRD with functional permanent access  Procedure:  Removal of Right Permcath  Performing Clinician: Cashay Manganelli  Surgeon:  Schnier  Anesthesia:  Local  EBL:  Minimal  Indication for the Procedure:  The patient has a functional permanent dialysis access and no longer needs their permcath.  This can be removed.  Risks and benefits are discussed and informed consent is obtained.  Description of the Procedure:  The patient's right neck, chest and existing catheter were sterilely prepped and draped. The area around the catheter was anesthetized copiously with 1% lidocaine. The catheter was dissected out with curved hemostats until the cuff was freed from the surrounding fibrous sheath. The fiber sheath was transected, and the catheter was then removed in its entirety using gentle traction. Pressure was held and sterile dressings were placed. The patient tolerated the procedure well and was taken to the recovery room in stable condition.  Krisha Beegle A Sharaine Delange  05/31/2020, 10:19 AM  This note was created with Dragon Medical transcription system. Any errors in dictation are purely unintentional.

## 2020-05-31 NOTE — H&P (Signed)
[facility] History & Physical Update  The patient was interviewed and re-examined.  The patient's previous History and Physical has been reviewed and is unchanged.  There is no change in the plan of care.  [name] Noone A Kevante Lunt, PA-C  05/31/2020, 10:00 AM

## 2020-05-31 NOTE — Telephone Encounter (Signed)
The pt has decided to cancel his procedure  he declined Tramadol and OTC medication. The pt inquired about removal of a cath an being medicated for that procedure she spoke with Dr. Delana Meyer

## 2020-05-31 NOTE — Discharge Instructions (Signed)
Tunneled Catheter Removal, Care After Refer to this sheet in the next few weeks. These instructions provide you with information about caring for yourself after your procedure. Your health care provider may also give you more specific instructions. Your treatment has been planned according to current medical practices, but problems sometimes occur. Call your health care provider if you have any problems or questions after your procedure. What can I expect after the procedure? After the procedure, it is common to have: Some mild redness, swelling, and pain around your catheter site.   Follow these instructions at home: Incision care  Check your removal site  every day for signs of infection. Check for: More redness, swelling, or pain. More fluid or blood. Warmth. Pus or a bad smell. Remove your dressing in 48hrs leave open to air  Activity  Return to your normal activities as told by your health care provider. Ask your health care provider what activities are safe for you. Do not lift anything that is heavier than 10 lb (4.5 kg) for 3 days  You may shower tomorrow  Contact a health care provider if: You have more fluid or blood coming from your removal site You have more redness, swelling, or pain at your incisions or around the area where your catheter was removed Your removal site feel warm to the touch. You feel unusually weak. You feel nauseous.. Get help right away if You have swelling in your arm, shoulder, neck, or face. You develop chest pain. You have difficulty breathing. You feel dizzy or light-headed. You have pus or a bad smell coming from your removal site You have a fever. You develop bleeding from your removal site, and your bleeding does not stop. This information is not intended to replace advice given to you by your health care provider. Make sure you discuss any questions you have with your health care provider. Document Released: 11/19/2012 Document Revised:  08/05/2016 Document Reviewed: 08/29/2015 Elsevier Interactive Patient Education  2017 Else[employer] Inc[employer] 

## 2020-05-31 NOTE — Progress Notes (Signed)
MRN : [id number]  [name] is a 54 y.o. ([birth year]) male who presents with chief complaint of  Chief Complaint  Patient presents with  . Follow-up    U/S Follow up  .  History of Present Illness:   The patient returns to the office for follow up regarding problem with the dialysis access. Currently the patient is maintained via a Catheter in combination with a right brachiocephalic fistula    The patient notes a significant difficulty with cannulation.  The patient has not been informed that there is increased recirculation.    The patient denies hand pain or other symptoms consistent with steal phenomena.  No significant arm swelling.  The patient denies redness or swelling at the access site. The patient denies fever or chills at home or while on dialysis.  The patient denies amaurosis fugax or recent TIA symptoms. There are no recent neurological changes noted. The patient denies claudication symptoms or rest pain symptoms. The patient denies history of DVT, PE or superficial thrombophlebitis. The patient denies recent episodes of angina or shortness of breath.      Current Meds  Medication Sig  . albuterol (PROVENTIL HFA;VENTOLIN HFA) 108 (90 Base) MCG/ACT inhaler Inhale 2 puffs into the lungs every 4 (four) hours as needed for wheezing or shortness of breath.  . allopurinol (ZYLOPRIM) 300 MG tablet Take 1 tablet (300 mg total) by mouth daily.  [name] ELLIPTA 62.5-25 MCG/INH AEPB INHALE 1 PUFF INTO THE LUNGS DAILY. (Patient taking differently: Inhale 1 puff into the lungs daily. )  . atorvastatin (LIPITOR) 40 MG tablet Take 1 tablet (40 mg total) by mouth at bedtime.  . carvedilol (COREG) 25 MG tablet Take 1 tablet (25 mg total) by mouth every 12 (twelve) hours.  . Cholecalciferol (D3-1000) 25 MCG (1000 UT) tablet Take 2 tablets (2,000 Units total) by mouth daily.  . cyclobenzaprine (FLEXERIL) 5 MG tablet Take 1 tablet (5 mg total) by mouth 2 (two) times daily as  needed for muscle spasms.  . furosemide (LASIX) 40 MG tablet TAKE 1 & 1/2 TABLETS BY MOUTH TWICE DAILY (Patient taking differently: 60 mg 2 (two) times daily. )  . gabapentin (NEURONTIN) 300 MG capsule TAKE (1) CAPSULE BY MOUTH TWICE DAILY. (Patient taking differently: Take 300 mg by mouth at bedtime. )  . GAVILAX 17 GM/SCOOP powder Take 17 g by mouth daily as needed for mild constipation or moderate constipation.   [name] Kitchen glimepiride (AMARYL) 4 MG tablet Take 4 mg by mouth daily.  . GNP ASPIRIN LOW DOSE 81 MG EC tablet TAKE 1 TABLET BY MOUTH ONCE DAILY. (Patient taking differently: Take 81 mg by mouth daily. )  . hydrALAZINE (APRESOLINE) 25 MG tablet TAKE 1 TABLET BY MOUTH TWICE DAILY FOR HIGH BLOOD PRESSURE (Patient taking differently: Take 25 mg by mouth in the morning and at bedtime. )  . insulin degludec (TRESIBA FLEXTOUCH) 100 UNIT/ML SOPN FlexTouch Pen Inject 0.16 mLs (16 Units total) into the skin at bedtime. (Patient taking differently: Inject 20 Units into the skin at bedtime. )  . isosorbide mononitrate (IMDUR) 60 MG 24 hr tablet TAKE 1 TABLET BY MOUTH ONCE DAILY. (Patient taking differently: Take 60 mg by mouth daily. )  . Magnesium Oxide 200 MG TABS Take 1 tablet (200 mg total) by mouth daily. (Patient taking differently: Take 100 mg by mouth daily. )  . metolazone (ZAROXOLYN) 5 MG tablet Take 1 tablet (5 mg total) by mouth daily.  . Multiple Vitamins-Minerals (  TAB-A-VITE) TABS TAKE 1 TABLET BY MOUTH ONCE DAILY. (Patient taking differently: Take 1 tablet by mouth daily. )  . multivitamin (RENA-VIT) TABS tablet Take 1 tablet by mouth at bedtime. (Patient taking differently: Take 1 tablet by mouth daily. )  . omeprazole (PRILOSEC) 20 MG capsule Take 1 capsule (20 mg total) by mouth 2 (two) times daily before a meal.  . oxyCODONE-acetaminophen (PERCOCET) 5-325 MG tablet Take 1-2 tablets by mouth every 6 (six) hours as needed for moderate pain or severe pain. (Patient not taking: Reported on  05/26/2020)  . SKYRIZI, 150 MG DOSE, 75 MG/0.83ML PSKT Inject 300 mg into the skin See admin instructions. Inject 300 mg into the skin every 4 months  . traZODone (DESYREL) 100 MG tablet Take one or one and half tab at night for sleep (Patient taking differently: Take 150 mg by mouth at bedtime as needed for sleep. Take one or one and half tab at night for sleep)  . triamcinolone cream (KENALOG) 0.1 % Apply 1 application topically as directed. Qd up to 5 days a week to aa psoriasis on body, avoid face, groin, axilla (Patient taking differently: Apply 1 application topically daily as needed (psoriasis). Qd up to 5 days a week to aa psoriasis on body, avoid face, groin, axilla)  . TRULICITY 1.5 ZD/6.3OV SOPN INJECT 1.5MG  INTO THE SKIN EVERY 7 DAYS. MONDAY. (Patient taking differently: Inject 1.5 mg into the skin every Monday. )  . [DISCONTINUED] latanoprost (XALATAN) 0.005 % ophthalmic solution Place 1 drop into both eyes at bedtime.   . [DISCONTINUED] Lido-Capsaicin-Men-Methyl Sal (1ST MEDX-PATCH/ LIDOCAINE) 4-0.025-5-20 % PTCH Apply 1 patch topically daily.  . [DISCONTINUED] VIGAMOX 0.5 % ophthalmic solution     Past Medical History:  Diagnosis Date  . Bell's palsy   . Bell's palsy   . Cataract   . CHF (congestive heart failure) ([place])   . Chronic kidney disease    dialysis  . COPD (chronic obstructive pulmonary disease) ([place])   . Coronary artery disease   . Diabetes mellitus without complication ([place])   . Dyspnea    Wheezing  . GERD (gastroesophageal reflux disease)   . Gout   . HOH (hard of hearing)    mild  . Hypercholesterolemia   . Hypertension   . NSVT (nonsustained ventricular tachycardia) ([place])   . Obstructive sleep apnea    CPAP, O2 use continuosly at 3LPM  . Orthopnea   . Oxygen dependent    3 lpm continuous  . Pneumonia    In Past X2 most recent 1 yr ago  . Psoriasis   . Psoriasis   . Pulmonary HTN ([place])   . Renal failure     Past Surgical History:  Procedure  Laterality Date  . AV FISTULA PLACEMENT Right 11/20/2019   Procedure: ARTERIOVENOUS (AV) FISTULA CREATION ( BRACHIAL CEPHALIC );  Surgeon: Katha Cabal, MD;  Location: [facility];  Service: Vascular;  Laterality: Right;  . CATARACT EXTRACTION W/PHACO Left 12/17/2019   Procedure: CATARACT EXTRACTION PHACO AND INTRAOCULAR LENS PLACEMENT ([place]) LEFT ISTENT INJ DIABETIC;  Surgeon: Eulogio Bear, MD;  Location: [facility];  Service: Ophthalmology;  Laterality: Left;  [place] 00:33.2 CDE 2.96 Fluid Pack Lot # [id number]  . CATARACT EXTRACTION W/PHACO Right 02/24/2020   Procedure: CATARACT EXTRACTION PHACO AND INTRAOCULAR LENS PLACEMENT (I[facility]) RIGHT DIABETIC ISTENT INJ;  Surgeon: Eulogio Bear, MD;  Location: [facility];  Service: Ophthalmology;  Laterality: Right;  [place] 00:37.3 CDE 2.88 Fluid Pack Lot # [id number]  .  CORONARY ANGIOPLASTY WITH STENT PLACEMENT     stent placement  . DIALYSIS/PERMA CATHETER INSERTION N/A 06/22/2019   Procedure: DIALYSIS/PERMA CATHETER INSERTION;  Surgeon: Algernon Huxley, MD;  Location: [facility];  Service: Cardiovascular;  Laterality: N/A;  . INSERTION OF AHMED VALVE Left 12/17/2019   Procedure: INSERTION OF iSTENT;  Surgeon: Eulogio Bear, MD;  Location: [facility];  Service: Ophthalmology;  Laterality: Left;    Social History Social History   Tobacco Use  . Smoking status: Never Smoker  . Smokeless tobacco: Never Used  Vaping Use  . Vaping Use: Never used  Substance Use Topics  . Alcohol use: Not Currently    Alcohol/week: 1.0 standard drink    Types: 1 Cans of beer per week    Comment: occasional  . Drug use: Not Currently    Types: Marijuana    Comment: in high school     Family History Family History  Problem Relation Age of Onset  . Heart failure Mother   . Kidney failure Brother     Allergies  Allergen Reactions  . Shellfish Allergy Anaphylaxis    Face and throat swelling, difficulty breathing Allergy can be triggered by touching  (contact)  . Betadine [Povidone Iodine] Rash     REVIEW OF SYSTEMS (Negative unless checked)  Constitutional: [] Weight loss  [] Fever  [] Chills Cardiac: [] Chest pain   [] Chest pressure   [] Palpitations   [] Shortness of breath when laying flat   [] Shortness of breath with exertion. Vascular:  [] Pain in legs with walking   [] Pain in legs at rest  [] History of DVT   [] Phlebitis   [] Swelling in legs   [] Varicose veins   [] Non-healing ulcers Pulmonary:   [] Uses home oxygen   [] Productive cough   [] Hemoptysis   [] Wheeze  [] COPD   [] Asthma Neurologic:  [] Dizziness   [] Seizures   [] History of stroke   [] History of TIA  [] Aphasia   [] Vissual changes   [] Weakness or numbness in arm   [] Weakness or numbness in leg Musculoskeletal:   [] Joint swelling   [x] Joint pain   [] Low back pain Hematologic:  [] Easy bruising  [] Easy bleeding   [] Hypercoagulable state   [] Anemic Gastrointestinal:  [] Diarrhea   [] Vomiting  [] Gastroesophageal reflux/heartburn   [] Difficulty swallowing. Genitourinary:  [x] Chronic kidney disease   [] Difficult urination  [] Frequent urination   [] Blood in urine Skin:  [] Rashes   [] Ulcers  Psychological:  [] History of anxiety   []  History of major depression.  Physical Examination  Vitals:   05/26/20 1346  BP: 113/70  Pulse: 80  Weight: 276 lb (125.2 kg)  Height: 5\' 4"  (1.626 m)   Body mass index is 47.38 kg/m. Gen: WD/WN, NAD Head: [place]/AT, No temporalis wasting.  Ear/Nose/Throat: Hearing grossly intact, nares w/o erythema or drainage Eyes: PER, EOMI, sclera nonicteric.  Neck: Supple, no large masses.   Pulmonary:  Good air movement, no audible wheezing bilaterally, no use of accessory muscles.  Cardiac: RRR, no JVD Vascular: Patient has a large arm.  The fistula is palpable with a good thrill.  Mild pulsatility.  Catheter is clean dry and intact. Vessel Right Left  Radial Palpable Palpable  Gastrointestinal: Non-distended. No guarding/no peritoneal signs.  Musculoskeletal:  M/S 5/5 throughout.  No deformity or atrophy.  Neurologic: CN 2-12 intact. Symmetrical.  Speech is fluent. Motor exam as listed above. Psychiatric: Judgment intact, Mood & affect appropriate for pt's clinical situation. Dermatologic: No rashes or ulcers noted.  No changes consistent with cellulitis.  CBC Lab Results  Component Value Date   WBC 14.2 (H) 11/21/2019   HGB 12.9 (L) 02/24/2020   HCT 38.0 (L) 02/24/2020   MCV 83.2 11/21/2019   PLT 207 11/21/2019    BMET    Component Value Date/Time   NA 138 02/24/2020 0635   NA 142 04/21/2019 1334   K 3.9 02/24/2020 0635   CL 99 02/24/2020 0635   CO2 22 11/21/2019 1853   GLUCOSE 110 (H) 02/24/2020 0635   BUN 26 (H) 02/24/2020 0635   BUN 21 04/21/2019 1334   CREATININE 3.80 (H) 02/24/2020 0635   CALCIUM 8.6 (L) 11/21/2019 1853   GFRNONAA 9 (L) 11/21/2019 1853   GFRAA 10 (L) 11/21/2019 1853   CrCl cannot be calculated (Patient's most recent lab result is older than the maximum 21 days allowed.).  COAG Lab Results  Component Value Date   INR 1.1 11/17/2019   INR 1.1 06/15/2019   INR 1.1 02/18/2019    Radiology VAS [place] (AVF, AVG)  Result Date: 05/26/2020 DIALYSIS ACCESS Reason for Exam: Routine follow up. Access Site: Right Upper Extremity. Access Type: Brachial-cephalic AVF. History: 11/20/19: Right brach-cephalic AVF creation;. Performing Technologist: Blondell Reveal RT, RDMS, RVT  Examination Guidelines: A complete evaluation includes B-mode imaging, spectral Doppler, color Doppler, and power Doppler as needed of all accessible portions of each vessel. Unilateral testing is considered an integral part of a complete examination. Limited examinations for reoccurring indications may be performed as noted.  Findings: +--------------------+----------+-----------------+--------+ AVF                 PSV (cm/s)Flow Vol (mL/min)Comments +--------------------+----------+-----------------+--------+ Native artery  inflow   152          1467                +--------------------+----------+-----------------+--------+ AVF Anastomosis        223                              +--------------------+----------+-----------------+--------+  +------------+----------+-------------+----------+-----------------------------+ OUTFLOW VEINPSV (cm/s)Diameter (cm)Depth (cm)          Describe            +------------+----------+-------------+----------+-----------------------------+ Confluence                                   not evaluated due to catheter                                                        location            +------------+----------+-------------+----------+-----------------------------+ Clavicle       118                                                         +------------+----------+-------------+----------+-----------------------------+ Shoulder       107                                                         +------------+----------+-------------+----------+-----------------------------+  Prox UA        344                                  Retained valve         +------------+----------+-------------+----------+-----------------------------+ Mid UA         132                            roughly 1.6 x 1.3 x 0.86cm                                                    pseudoaneurysm with 2                                                   communication points at the                                                mid/distal upper arm level   +------------+----------+-------------+----------+-----------------------------+ Dist UA        118                               0.82 x 0.80 x 0.66cm                                                   hypoechoic structure adjacent                                                 to outflow vein with no                                                         internal flow          +------------+----------+-------------+----------+-----------------------------+ AC Fossa       157                                                         +------------+----------+-------------+----------+-----------------------------+ Antegrade, triphasic flow in the right distal radial artery.  Summary: Patent right brachial-cephalic AVF with no hemodynamically significant focal stenosis or significant internal vessel narrowing. The Flow Volume is normal. 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 Evidence of hematoma versus occluded pseudoaneurysm at the distal upper arm  level.  *See table(s) above for measurements and observations.  Diagnosing physician: [name] MD Electronically signed by [name] MD on 05/26/2020 at 5:53:07 PM.   --------------------------------------------------------------------------------   Final      Assessment/Plan 1. Complication from renal dialysis device, sequela Recommend:  The patient is experiencing increasing problems with their dialysis access.  Patient should have a fistulagram with the intention for intervention.  The intention for intervention is to restore appropriate flow and prevent thrombosis and possible loss of the access.  As well as improve the quality of dialysis therapy.  If the fistula appears good with a high probability of ease of cannulation then his catheter will be removed at the same time.  The risks, benefits and alternative therapies were reviewed in detail with the patient.  All questions were answered.  The patient agrees to proceed with angio/intervention.      2. End stage renal disease ([place]) At the present time the patient has adequate dialysis access.  Continue hemodialysis as ordered without interruption.  Avoid nephrotoxic medications and dehydration.  Further plans per nephrology  3. DM type 2 with diabetic peripheral neuropathy ([place]) Continue hypoglycemic  medications as already ordered, these medications have been reviewed and there are no changes at this time.  Hgb A1C to be monitored as already arranged by primary service   4. Coronary artery disease involving native coronary artery of native heart without angina pectoris Continue cardiac and antihypertensive medications as already ordered and reviewed, no changes at this time.  Continue statin as ordered and reviewed, no changes at this time  Nitrates PRN for chest pain   5. Essential hypertension Continue antihypertensive medications as already ordered, these medications have been reviewed and there are no changes at this time.     [name], MD  05/31/2020 8:12 AM

## 2020-06-28 DIAGNOSIS — Z992 Dependence on renal dialysis: Secondary | ICD-10-CM | POA: Insufficient documentation

## 2020-06-30 ENCOUNTER — Ambulatory Visit: Payer: Medicare Other | Admitting: Podiatry

## 2020-06-30 ENCOUNTER — Encounter: Payer: Self-pay | Admitting: Podiatry

## 2020-06-30 ENCOUNTER — Ambulatory Visit (INDEPENDENT_AMBULATORY_CARE_PROVIDER_SITE_OTHER): Payer: Medicare Other | Admitting: Podiatry

## 2020-06-30 ENCOUNTER — Other Ambulatory Visit: Payer: Self-pay

## 2020-06-30 DIAGNOSIS — N186 End stage renal disease: Secondary | ICD-10-CM

## 2020-06-30 DIAGNOSIS — I872 Venous insufficiency (chronic) (peripheral): Secondary | ICD-10-CM

## 2020-06-30 DIAGNOSIS — M79676 Pain in unspecified toe(s): Secondary | ICD-10-CM

## 2020-06-30 DIAGNOSIS — B351 Tinea unguium: Secondary | ICD-10-CM

## 2020-06-30 DIAGNOSIS — E1142 Type 2 diabetes mellitus with diabetic polyneuropathy: Secondary | ICD-10-CM

## 2020-06-30 NOTE — Progress Notes (Signed)
This patient returns to my office for at risk foot care.  This patient requires this care by a professional since this patient will be at risk due to having diabetes mellitus and ESRD.  This patient is unable to cut nails himself since the patient cannot reach his nails.These nails are painful walking and wearing shoes.  This patient presents for at risk foot care today.  General Appearance  Alert, conversant and in no acute stress.  Vascular  Dorsalis pedis and posterior tibial  pulses are weakly  palpable due to swelling. bilaterally.  Capillary return is within normal limits  bilaterally. Temperature is within normal limits  Bilaterally.  Venous stasis  B/L.  Neurologic  Senn-Weinstein monofilament wire test within normal limits  bilaterally. Muscle power within normal limits bilaterally.  Nails Thick disfigured discolored nails with subungual debris  from hallux to fifth toes bilaterally. No evidence of bacterial infection or drainage bilaterally.  Orthopedic  No limitations of motion  feet .  No crepitus or effusions noted.  No bony pathology or digital deformities noted.  Skin  normotropic skin with no porokeratosis noted bilaterally.  No signs of infections or ulcers noted.  Punctate psoriasis plantar aspect feet  B/l.   Onychomycosis  Pain in right toes  Pain in left toes  Consent was obtained for treatment procedures.   Mechanical debridement of nails 1-5  bilaterally performed with a nail nipper.  Filed with dremel without incident.    Return office visit  3 months                    Told patient to return for periodic foot care and evaluation due to potential at risk complications.   Gardiner Barefoot DPM

## 2020-07-04 ENCOUNTER — Telehealth: Payer: Self-pay

## 2020-07-04 NOTE — Telephone Encounter (Signed)
Rick with [employer] left message advising patient's Orson Ape PA was denied and should they proceed with appeal. Returned call to [phone] and left a message advising to proceed with appeal and call our office if anything further is needed, JS

## 2020-08-08 ENCOUNTER — Other Ambulatory Visit: Payer: Self-pay

## 2020-08-08 MED ORDER — SKYRIZI 150 MG/ML ~~LOC~~ SOSY: 1.0000 | PREFILLED_SYRINGE | SUBCUTANEOUS | 0 refills | Status: DC

## 2020-08-08 NOTE — Progress Notes (Signed)
Per senderra switch RX from 75mg  X2 to the 150mg  syringe.

## 2020-08-09 ENCOUNTER — Telehealth: Payer: Self-pay

## 2020-08-09 NOTE — Telephone Encounter (Signed)
OM for office visit on 8/26

## 2020-08-11 ENCOUNTER — Ambulatory Visit: Payer: Medicare Other | Admitting: Hospice and Palliative Medicine

## 2020-08-15 ENCOUNTER — Telehealth: Payer: Self-pay

## 2020-08-15 NOTE — Telephone Encounter (Signed)
Billed missed appt fee 08/11/20

## 2020-08-17 MED ORDER — SKYRIZI 150 MG/ML ~~LOC~~ SOSY: 150.0000 mg | PREFILLED_SYRINGE | SUBCUTANEOUS | 0 refills | Status: DC

## 2020-08-17 NOTE — Addendum Note (Signed)
Addended by: Harriett Sine on: 08/17/2020 04:36 PM   Modules accepted: Orders

## 2020-08-30 ENCOUNTER — Other Ambulatory Visit: Payer: Self-pay | Admitting: Adult Health

## 2020-08-30 DIAGNOSIS — E1165 Type 2 diabetes mellitus with hyperglycemia: Secondary | ICD-10-CM

## 2020-09-07 ENCOUNTER — Other Ambulatory Visit (INDEPENDENT_AMBULATORY_CARE_PROVIDER_SITE_OTHER): Payer: Self-pay | Admitting: Vascular Surgery

## 2020-09-07 DIAGNOSIS — T829XXS Unspecified complication of cardiac and vascular prosthetic device, implant and graft, sequela: Secondary | ICD-10-CM

## 2020-09-07 DIAGNOSIS — N186 End stage renal disease: Secondary | ICD-10-CM

## 2020-09-08 ENCOUNTER — Encounter (INDEPENDENT_AMBULATORY_CARE_PROVIDER_SITE_OTHER): Payer: Self-pay | Admitting: Vascular Surgery

## 2020-09-08 ENCOUNTER — Other Ambulatory Visit: Payer: Self-pay

## 2020-09-08 ENCOUNTER — Ambulatory Visit (INDEPENDENT_AMBULATORY_CARE_PROVIDER_SITE_OTHER): Payer: Medicare Other

## 2020-09-08 ENCOUNTER — Ambulatory Visit (INDEPENDENT_AMBULATORY_CARE_PROVIDER_SITE_OTHER): Payer: Medicare Other | Admitting: Vascular Surgery

## 2020-09-08 VITALS — BP 121/86 | HR 88 | Resp 16 | Wt 270.8 lb

## 2020-09-08 DIAGNOSIS — N186 End stage renal disease: Secondary | ICD-10-CM | POA: Diagnosis not present

## 2020-09-08 DIAGNOSIS — T829XXS Unspecified complication of cardiac and vascular prosthetic device, implant and graft, sequela: Secondary | ICD-10-CM

## 2020-09-08 DIAGNOSIS — I25118 Atherosclerotic heart disease of native coronary artery with other forms of angina pectoris: Secondary | ICD-10-CM | POA: Diagnosis not present

## 2020-09-08 DIAGNOSIS — E119 Type 2 diabetes mellitus without complications: Secondary | ICD-10-CM

## 2020-09-08 DIAGNOSIS — I10 Essential (primary) hypertension: Secondary | ICD-10-CM | POA: Diagnosis not present

## 2020-09-08 NOTE — Progress Notes (Signed)
MRN : [id number]  [name] is a 54 y.o. (05/04/1966) male who presents with chief complaint of  Chief Complaint  Patient presents with  . Follow-up    ultrasound follow up  .  History of Present Illness:   The patient returns to the office for followup of their dialysis access. The function of the access has been stable. The patient denies increased bleeding time or increased recirculation. Patient denies difficulty with cannulation. The patient denies hand pain or other symptoms consistent with steal phenomena.  No significant arm swelling.  The patient denies redness or swelling at the access site. The patient denies fever or chills at home or while on dialysis.  The patient denies amaurosis fugax or recent TIA symptoms. There are no recent neurological changes noted. The patient denies claudication symptoms or rest pain symptoms. The patient denies history of DVT, PE or superficial thrombophlebitis. The patient denies recent episodes of angina or shortness of breath.   Duplex ultrasound of the AV access shows a patent access.  The previously noted stenosis is unchanged compared to last study.     Current Meds  Medication Sig  . albuterol (PROVENTIL HFA;VENTOLIN HFA) 108 (90 Base) MCG/ACT inhaler Inhale 2 puffs into the lungs every 4 (four) hours as needed for wheezing or shortness of breath.  . allopurinol (ZYLOPRIM) 100 MG tablet Take 200 mg by mouth daily.  [name] ELLIPTA 62.5-25 MCG/INH AEPB INHALE 1 PUFF INTO THE LUNGS DAILY. (Patient taking differently: Inhale 1 puff into the lungs daily. )  . atorvastatin (LIPITOR) 40 MG tablet Take 1 tablet (40 mg total) by mouth at bedtime.  . carvedilol (COREG) 25 MG tablet Take 1 tablet (25 mg total) by mouth every 12 (twelve) hours.  . Cholecalciferol (D3-1000) 25 MCG (1000 UT) tablet Take 2 tablets (2,000 Units total) by mouth daily.  . cyclobenzaprine (FLEXERIL) 5 MG tablet Take 1 tablet (5 mg total) by mouth 2 (two)  times daily as needed for muscle spasms.  . Dulaglutide (TRULICITY) 1.5 JS/9.7WY SOPN Inject 1.5 sq every week  . DUREZOL 0.05 % EMUL   . furosemide (LASIX) 40 MG tablet Take 1 tablet (40 mg total) by mouth 2 (two) times daily.  [name] Kitchen gabapentin (NEURONTIN) 300 MG capsule TAKE (1) CAPSULE BY MOUTH TWICE DAILY. (Patient taking differently: Take 300 mg by mouth at bedtime. )  . GAVILAX 17 GM/SCOOP powder Take 17 g by mouth daily as needed for mild constipation or moderate constipation.   [name] Kitchen glimepiride (AMARYL) 4 MG tablet Take 4 mg by mouth daily.  . GNP ASPIRIN LOW DOSE 81 MG EC tablet TAKE 1 TABLET BY MOUTH ONCE DAILY. (Patient taking differently: Take 81 mg by mouth daily. )  . hydrALAZINE (APRESOLINE) 25 MG tablet TAKE 1 TABLET BY MOUTH TWICE DAILY FOR HIGH BLOOD PRESSURE (Patient taking differently: Take 25 mg by mouth in the morning and at bedtime. )  . insulin degludec (TRESIBA FLEXTOUCH) 100 UNIT/ML FlexTouch Pen Inject 20 Units into the skin at bedtime.  . isosorbide mononitrate (IMDUR) 60 MG 24 hr tablet TAKE 1 TABLET BY MOUTH ONCE DAILY.  [name] Kitchen latanoprost (XALATAN) 0.005 % ophthalmic solution   . lidocaine-prilocaine (EMLA) cream Apply 1 application topically daily as needed (port).   . Magnesium Oxide 200 MG TABS Take 1 tablet (200 mg total) by mouth daily. (Patient taking differently: Take 100 mg by mouth daily. )  . metolazone (ZAROXOLYN) 5 MG tablet Take 1 tablet (5 mg total) by  mouth daily.  . Multiple Vitamins-Minerals (TAB-A-VITE) TABS TAKE 1 TABLET BY MOUTH ONCE DAILY. (Patient taking differently: Take 1 tablet by mouth daily. )  . multivitamin (RENA-VIT) TABS tablet Take 1 tablet by mouth at bedtime. (Patient taking differently: Take 1 tablet by mouth daily. )  . omeprazole (PRILOSEC) 20 MG capsule Take 1 capsule (20 mg total) by mouth 2 (two) times daily before a meal.  . Risankizumab-rzaa (SKYRIZI) 150 MG/ML SOSY Inject 1 Syringe into the skin as directed. Inject 1 syringe SQ every 12  weeks.  . Risankizumab-rzaa (SKYRIZI) 150 MG/ML SOSY Inject 150 mg into the skin as directed. Every 12 weeks for maintenance.  . SKYRIZI, 150 MG DOSE, 75 MG/0.83ML PSKT Inject 300 mg into the skin See admin instructions. Inject 300 mg into the skin every 4 months  . traZODone (DESYREL) 100 MG tablet Take one or one and half tab at night for sleep (Patient taking differently: Take 150 mg by mouth at bedtime as needed for sleep. Take one or one and half tab at night for sleep)  . triamcinolone cream (KENALOG) 0.1 % Apply 1 application topically as directed. Qd up to 5 days a week to aa psoriasis on body, avoid face, groin, axilla (Patient taking differently: Apply 1 application topically daily as needed (psoriasis). Qd up to 5 days a week to aa psoriasis on body, avoid face, groin, axilla)    Past Medical History:  Diagnosis Date  . Bell's palsy   . Bell's palsy   . Cataract   . CHF (congestive heart failure) ([place])   . Chronic kidney disease    dialysis  . COPD (chronic obstructive pulmonary disease) ([place])   . Coronary artery disease   . Diabetes mellitus without complication ([place])   . Dyspnea    Wheezing  . GERD (gastroesophageal reflux disease)   . Gout   . HOH (hard of hearing)    mild  . Hypercholesterolemia   . Hypertension   . NSVT (nonsustained ventricular tachycardia) ([place])   . Obstructive sleep apnea    CPAP, O2 use continuosly at 3LPM  . Orthopnea   . Oxygen dependent    3 lpm continuous  . Pneumonia    In Past X2 most recent 1 yr ago  . Psoriasis   . Psoriasis   . Pulmonary HTN ([place])   . Renal failure     Past Surgical History:  Procedure Laterality Date  . AV FISTULA PLACEMENT Right 11/20/2019   Procedure: ARTERIOVENOUS (AV) FISTULA CREATION ( BRACHIAL CEPHALIC );  Surgeon: Katha Cabal, MD;  Location: [facility];  Service: Vascular;  Laterality: Right;  . CATARACT EXTRACTION W/PHACO Left 12/17/2019   Procedure: CATARACT EXTRACTION PHACO AND INTRAOCULAR LENS  PLACEMENT ([place]) LEFT ISTENT INJ DIABETIC;  Surgeon: Eulogio Bear, MD;  Location: [facility];  Service: Ophthalmology;  Laterality: Left;  [place] 00:33.2 CDE 2.96 Fluid Pack Lot # [id number]  . CATARACT EXTRACTION W/PHACO Right 02/24/2020   Procedure: CATARACT EXTRACTION PHACO AND INTRAOCULAR LENS PLACEMENT (IOC) RIGHT DIABETIC ISTENT INJ;  Surgeon: Eulogio Bear, MD;  Location: [facility];  Service: Ophthalmology;  Laterality: Right;  [place] 00:37.3 CDE 2.88 Fluid Pack Lot # [id number]  . CORONARY ANGIOPLASTY WITH STENT PLACEMENT     stent placement  . DIALYSIS/PERMA CATHETER INSERTION N/A 06/22/2019   Procedure: DIALYSIS/PERMA CATHETER INSERTION;  Surgeon: Algernon Huxley, MD;  Location: [place] [facility];  Service: Cardiovascular;  Laterality: N/A;  . DIALYSIS/PERMA CATHETER REMOVAL N/A 05/31/2020  Procedure: DIALYSIS/PERMA CATHETER REMOVAL;  Surgeon: Katha Cabal, MD;  Location: [place] [facility];  Service: Cardiovascular;  Laterality: N/A;  . INSERTION OF AHMED VALVE Left 12/17/2019   Procedure: INSERTION OF iSTENT;  Surgeon: Eulogio Bear, MD;  Location: [facility];  Service: Ophthalmology;  Laterality: Left;    Social History Social History   Tobacco Use  . Smoking status: Never Smoker  . Smokeless tobacco: Never Used  Vaping Use  . Vaping Use: Never used  Substance Use Topics  . Alcohol use: Not Currently    Alcohol/week: 1.0 standard drink    Types: 1 Cans of beer per week    Comment: occasional  . Drug use: Not Currently    Types: Marijuana    Comment: in high school     Family History Family History  Problem Relation Age of Onset  . Heart failure Mother   . Kidney failure Brother     Allergies  Allergen Reactions  . Shellfish Allergy Anaphylaxis    Face and throat swelling, difficulty breathing Allergy can be triggered by touching (contact)  . Betadine [Povidone Iodine] Rash  . Povidone-Iodine Rash     REVIEW OF SYSTEMS (Negative unless  checked)  Constitutional: [] Weight loss  [] Fever  [] Chills Cardiac: [] Chest pain   [] Chest pressure   [] Palpitations   [] Shortness of breath when laying flat   [] Shortness of breath with exertion. Vascular:  [] Pain in legs with walking   [] Pain in legs at rest  [] History of DVT   [] Phlebitis   [] Swelling in legs   [] Varicose veins   [] Non-healing ulcers Pulmonary:   [] Uses home oxygen   [] Productive cough   [] Hemoptysis   [] Wheeze  [] COPD   [] Asthma Neurologic:  [] Dizziness   [] Seizures   [] History of stroke   [] History of TIA  [] Aphasia   [] Vissual changes   [] Weakness or numbness in arm   [] Weakness or numbness in leg Musculoskeletal:   [] Joint swelling   [] Joint pain   [] Low back pain Hematologic:  [] Easy bruising  [] Easy bleeding   [] Hypercoagulable state   [] Anemic Gastrointestinal:  [] Diarrhea   [] Vomiting  [] Gastroesophageal reflux/heartburn   [] Difficulty swallowing. Genitourinary:  [x] Chronic kidney disease   [] Difficult urination  [] Frequent urination   [] Blood in urine Skin:  [] Rashes   [] Ulcers  Psychological:  [] History of anxiety   []  History of major depression.  Physical Examination  Vitals:   09/08/20 1115  BP: 121/86  Pulse: 88  Resp: 16  Weight: 270 lb 12.8 oz (122.8 kg)   Body mass index is 46.48 kg/m. Gen: WD/WN, NAD Head: [place]/AT, No temporalis wasting.  Ear/Nose/Throat: Hearing grossly intact, nares w/o erythema or drainage Eyes: PER, EOMI, sclera nonicteric.  Neck: Supple, no large masses.   Pulmonary:  Good air movement, no audible wheezing bilaterally, no use of accessory muscles.  Cardiac: RRR, no JVD Vascular: right arm good thrill good bruit Vessel Right Left  Radial Palpable Palpable  Gastrointestinal: Non-distended. No guarding/no peritoneal signs.  Musculoskeletal: M/S 5/5 throughout.  No deformity or atrophy.  Neurologic: CN 2-12 intact. Symmetrical.  Speech is fluent. Motor exam as listed above. Psychiatric: Judgment intact, Mood & affect  appropriate for pt's clinical situation. Dermatologic: No rashes or ulcers noted.  No changes consistent with cellulitis.   CBC Lab Results  Component Value Date   WBC 14.2 (H) 11/21/2019   HGB 12.9 (L) 02/24/2020   HCT 38.0 (L) 02/24/2020   MCV 83.2 11/21/2019   PLT 207  11/21/2019    BMET    Component Value Date/Time   NA 138 02/24/2020 0635   NA 142 04/21/2019 1334   K 3.9 02/24/2020 0635   CL 99 02/24/2020 0635   CO2 22 11/21/2019 1853   GLUCOSE 110 (H) 02/24/2020 0635   BUN 26 (H) 02/24/2020 0635   BUN 21 04/21/2019 1334   CREATININE 3.80 (H) 02/24/2020 0635   CALCIUM 8.6 (L) 11/21/2019 1853   GFRNONAA 9 (L) 11/21/2019 1853   GFRAA 10 (L) 11/21/2019 1853   CrCl cannot be calculated (Patient's most recent lab result is older than the maximum 21 days allowed.).  COAG Lab Results  Component Value Date   INR 1.1 11/17/2019   INR 1.1 06/15/2019   INR 1.1 02/18/2019    Radiology No results found.   Assessment/Plan 1. Complication from renal dialysis device, sequela Recommend:  The patient is doing well and currently has adequate dialysis access. The patient's dialysis center is not reporting any access issues. Flow pattern is stable when compared to the prior ultrasound.  The patient should have a duplex ultrasound of the dialysis access in 6 months.  The patient will follow-up with me in the office after each ultrasound   - VAS [place] (AVF, AVG); Future  2. End stage renal disease ([place]) At the present time the patient has adequate dialysis access.  Continue hemodialysis as ordered without interruption.  Avoid nephrotoxic medications and dehydration.  Further plans per nephrology  3. Coronary artery disease of native artery of native heart with stable angina pectoris (HCC) Continue cardiac and antihypertensive medications as already ordered and reviewed, no changes at this time.  Continue statin as ordered and reviewed, no changes  at this time  Nitrates PRN for chest pain   4. Essential hypertension Continue antihypertensive medications as already ordered, these medications have been reviewed and there are no changes at this time.   5. Diabetes mellitus without complication ([place]) Continue hypoglycemic medications as already ordered, these medications have been reviewed and there are no changes at this time.  Hgb A1C to be monitored as already arranged by primary service    [name], MD  09/08/2020 12:33 PM

## 2020-09-22 ENCOUNTER — Other Ambulatory Visit: Payer: Self-pay

## 2020-09-22 ENCOUNTER — Ambulatory Visit (INDEPENDENT_AMBULATORY_CARE_PROVIDER_SITE_OTHER): Payer: Medicare Other | Admitting: Dermatology

## 2020-09-22 DIAGNOSIS — L409 Psoriasis, unspecified: Secondary | ICD-10-CM

## 2020-09-22 MED ORDER — ENSTILAR 0.005-0.064 % EX FOAM: CUTANEOUS | 2 refills | Status: DC

## 2020-09-22 NOTE — Progress Notes (Signed)
   Follow-Up Visit   Subjective  [name] is a 54 y.o. male who presents for the following: Psoriasis (6 months f/u on Psoriasis pt taking Skyrizi injection every 12 weeks, using [facility] cream with a fair response ). Pt self injected his last Skyrizi injection 3 days ago with no problems. Kidney dialysis patient   The following portions of the chart were reviewed this encounter and updated as appropriate:  Tobacco  Allergies  Meds  Problems  Med Hx  Surg Hx  Fam Hx     Review of Systems:  No other skin or systemic complaints except as noted in HPI or Assessment and Plan.  Objective  Well appearing patient in no apparent distress; mood and affect are within normal limits.  A focused examination was performed including face, exts, trunk. Relevant physical exam findings are noted in the Assessment and Plan.  Objective  trunk, exts: Well-demarcated erythematous papules/plaques with silvery scale.    Assessment & Plan  Psoriasis -severe on systemic Skyrizi injections with some improvement but some flare since last seen. trunk, exts Severe without PsA,  BSA 20% today while on treatment, on Skyrizi since 08/210  Psoriasis complicated by diabetes, kidney disease on dialysis, COPD, Hypertension, CHF   Cont Skyrizi 150mg  (75mg  x 2) sq injections q 12 wks. Start Enstilar foam  qd up to 5d/wk aa psoriasis, avoid f/g/a   Labs ordered today faxed to [facility]    Other Related Procedures Comprehensive metabolic panel CBC with Differential/Platelet Hepatitis B surface antibody,quantitative Hepatitis B surface antigen Hepatitis B core antibody, total Hepatitis C antibody HIV Antibody (routine testing w rflx) QuantiFERON-TB Gold Plus  Ordered Medications: Calcipotriene-Betameth Diprop (ENSTILAR) 0.005-0.064 % FOAM  Return in about 3 months (around 12/23/2020) for Psoriasis.  IMarye Round, CMA, am acting as scribe for Sarina Ser, MD .  Documentation: I  have reviewed the above documentation for accuracy and completeness, and I agree with the above.  Sarina Ser, MD

## 2020-09-23 ENCOUNTER — Encounter: Payer: Self-pay | Admitting: Dermatology

## 2020-09-23 ENCOUNTER — Telehealth: Payer: Self-pay

## 2020-09-23 ENCOUNTER — Other Ambulatory Visit: Payer: Self-pay | Admitting: Adult Health

## 2020-09-23 DIAGNOSIS — I270 Primary pulmonary hypertension: Secondary | ICD-10-CM

## 2020-09-23 NOTE — Telephone Encounter (Signed)
lmom by [place] pt need appt for refills

## 2020-09-23 NOTE — Telephone Encounter (Signed)
Pt need appt for refills  ?

## 2020-09-26 ENCOUNTER — Telehealth: Payer: Self-pay

## 2020-09-26 NOTE — Telephone Encounter (Signed)
You may send that combo in: One in morning; the other at bedtime for 2 weeks. After 2 weeks, use 1 on odd days; then the other even days.

## 2020-09-26 NOTE — Telephone Encounter (Signed)
Patient's Enstilar PA denied. Patient must first try and fail combination of Calcipotriene Cream and Augmented Betamethasone Dipropionate Cream.

## 2020-09-27 MED ORDER — CALCIPOTRIENE 0.005 % EX CREA: TOPICAL_CREAM | CUTANEOUS | 0 refills | Status: DC

## 2020-09-27 MED ORDER — BETAMETHASONE DIPROPIONATE 0.05 % EX CREA: TOPICAL_CREAM | CUTANEOUS | 3 refills | Status: DC

## 2020-09-27 NOTE — Telephone Encounter (Signed)
Prescriptions sent in and left msg for patient to return my call.

## 2020-09-29 NOTE — Telephone Encounter (Signed)
Patient advised of medication change.

## 2020-10-04 ENCOUNTER — Other Ambulatory Visit: Payer: Self-pay

## 2020-10-04 MED ORDER — BETAMETHASONE DIPROPIONATE 0.05 % EX CREA: TOPICAL_CREAM | CUTANEOUS | 0 refills | Status: DC

## 2020-10-04 MED ORDER — CALCIPOTRIENE 0.005 % EX CREA: TOPICAL_CREAM | CUTANEOUS | 1 refills | Status: DC

## 2020-10-04 NOTE — Progress Notes (Signed)
Patient called regarding Calcipotriene and Betamethasone sent in last week. Patient states he is using on large areas and will run out of cream in less then a week. RX sent in to dispense larger quantity.

## 2020-10-06 ENCOUNTER — Ambulatory Visit (INDEPENDENT_AMBULATORY_CARE_PROVIDER_SITE_OTHER): Payer: Medicare Other | Admitting: Podiatry

## 2020-10-06 ENCOUNTER — Encounter: Payer: Self-pay | Admitting: Podiatry

## 2020-10-06 ENCOUNTER — Other Ambulatory Visit: Payer: Self-pay

## 2020-10-06 DIAGNOSIS — B351 Tinea unguium: Secondary | ICD-10-CM | POA: Diagnosis not present

## 2020-10-06 DIAGNOSIS — L409 Psoriasis, unspecified: Secondary | ICD-10-CM

## 2020-10-06 DIAGNOSIS — M79676 Pain in unspecified toe(s): Secondary | ICD-10-CM

## 2020-10-06 DIAGNOSIS — N186 End stage renal disease: Secondary | ICD-10-CM

## 2020-10-06 DIAGNOSIS — E1142 Type 2 diabetes mellitus with diabetic polyneuropathy: Secondary | ICD-10-CM

## 2020-10-06 NOTE — Progress Notes (Signed)
This patient returns to my office for at risk foot care.  This patient requires this care by a professional since this patient will be at risk due to having diabetes mellitus and ESRD.  This patient is unable to cut nails himself since the patient cannot reach his nails.These nails are painful walking and wearing shoes.  This patient presents for at risk foot care today. ° °General Appearance  Alert, conversant and in no acute stress. ° °Vascular  Dorsalis pedis and posterior tibial  pulses are weakly  palpable due to swelling. bilaterally.  Capillary return is within normal limits  bilaterally. Temperature is within normal limits  Bilaterally.  Venous stasis  B/L. ° °Neurologic  Senn-Weinstein monofilament wire test within normal limits  bilaterally. Muscle power within normal limits bilaterally. ° °Nails Thick disfigured discolored nails with subungual debris  from hallux to fifth toes bilaterally. No evidence of bacterial infection or drainage bilaterally. ° °Orthopedic  No limitations of motion  feet .  No crepitus or effusions noted.  No bony pathology or digital deformities noted. ° °Skin  normotropic skin with no porokeratosis noted bilaterally.  No signs of infections or ulcers noted.  Punctate psoriasis plantar aspect feet  B/l.  ° °Onychomycosis  Pain in right toes  Pain in left toes ° °Consent was obtained for treatment procedures.   Mechanical debridement of nails 1-5  bilaterally performed with a nail nipper.  Filed with dremel without incident.  ° ° °Return office visit  10 weeks                   Told patient to return for periodic foot care and evaluation due to potential at risk complications. ° ° °Ronni Osterberg DPM  °

## 2020-10-07 ENCOUNTER — Telehealth (INDEPENDENT_AMBULATORY_CARE_PROVIDER_SITE_OTHER): Payer: Self-pay | Admitting: Gastroenterology

## 2020-10-07 ENCOUNTER — Telehealth: Payer: Self-pay | Admitting: Cardiology

## 2020-10-07 ENCOUNTER — Other Ambulatory Visit: Payer: Self-pay

## 2020-10-07 DIAGNOSIS — Z1211 Encounter for screening for malignant neoplasm of colon: Secondary | ICD-10-CM

## 2020-10-07 MED ORDER — PEG 3350-KCL-NA BICARB-NACL 420 G PO SOLR: 4000.0000 mL | Freq: Once | ORAL | 0 refills | Status: AC

## 2020-10-07 NOTE — Telephone Encounter (Signed)
   [facility] Pre-operative Risk Assessment    HEARTCARE STAFF: - Please ensure there is not already an duplicate clearance open for this procedure. - Under Visit Info/Reason for Call, type in Other and utilize the format Clearance MM/DD/YY or Clearance TBD. Do not use dashes or single digits. - If request is for dental extraction, please clarify the # of teeth to be extracted.  Request for surgical clearance:  1. What type of surgery is being performed? COLONOSCOPY  When is this surgery scheduled? 11/24/20  2. What type of clearance is required (medical clearance vs. Pharmacy clearance to hold med vs. Both)? NOT LISTED  3. Are there any medications that need to be held prior to surgery and how long? NOT LISTED  4. Practice name and name of physician performing surgery?  [facility]  What is the office phone number? [phone]   7.   What is the office fax number? [phone]  8.   Anesthesia type (None, local, MAC, general) ? NOT LISTED   [name] 10/07/2020, 12:59 PM  _________________________________________________________________   (provider comments below)

## 2020-10-07 NOTE — Progress Notes (Signed)
Gastroenterology Pre-Procedure Review  Request Date: Thursday 11/24/20 Requesting Physician: Dr. Vicente Males  PATIENT REVIEW QUESTIONS: The patient responded to the following health history questions as indicated:    1. Are you having any GI issues? yes (occasional blood in stool) 2. Do you have a personal history of Polyps? no 3. Do you have a family history of Colon Cancer or Polyps? no 4. Diabetes Mellitus? yes (type 2) 5. Joint replacements in the past 12 months?yes (6 months ago fistula inserted in arm) 6. Major health problems in the past 3 months?fistula insertion 6 months ago 7. Any artificial heart valves, MVP, or defibrillator?Cardiac Clearance to be sent to Dr. Mylo Red Pt has CHF    MEDICATIONS & ALLERGIES:    Patient reports the following regarding taking any anticoagulation/antiplatelet therapy:   Plavix, Coumadin, Eliquis, Xarelto, Lovenox, Pradaxa, Brilinta, or Effient? no Aspirin? no  Patient confirms/reports the following medications:  Current Outpatient Medications  Medication Sig Dispense Refill  . albuterol (PROVENTIL HFA;VENTOLIN HFA) 108 (90 Base) MCG/ACT inhaler Inhale 2 puffs into the lungs every 4 (four) hours as needed for wheezing or shortness of breath. 6.7 g 5  . allopurinol (ZYLOPRIM) 100 MG tablet Take by mouth.    [name] ELLIPTA 62.5-25 MCG/INH AEPB INHALE 1 PUFF INTO THE LUNGS DAILY. (Patient taking differently: Inhale 1 puff into the lungs daily. ) 60 each 11  . atorvastatin (LIPITOR) 40 MG tablet Take 1 tablet (40 mg total) by mouth at bedtime. 90 tablet 3  . betamethasone dipropionate 0.05 % cream Apply topically as directed. Apply every AM for 2 weeks then decrease to every other day 135 g 0  . calcipotriene (DOVONOX) 0.005 % cream Apply topically as directed. Apply every PM for two weeks then every other day alternating with the betamethasone 120 g 1  . carvedilol (COREG) 25 MG tablet Take 1 tablet (25 mg total) by mouth every 12 (twelve) hours. 180 tablet 3   . Cholecalciferol (D3-1000) 25 MCG (1000 UT) tablet Take 2 tablets (2,000 Units total) by mouth daily. 60 tablet 3  . cyclobenzaprine (FLEXERIL) 5 MG tablet Take 1 tablet (5 mg total) by mouth 2 (two) times daily as needed for muscle spasms. 60 tablet 0  . Dulaglutide (TRULICITY) 1.5 FY/1.0FB SOPN Inject 1.5 sq every week 12 mL 3  . DUREZOL 0.05 % EMUL     . furosemide (LASIX) 40 MG tablet Take 1 tablet (40 mg total) by mouth 2 (two) times daily. 84 tablet 11  . gabapentin (NEURONTIN) 600 MG tablet Take by mouth.    [name] Kitchen GAVILAX 17 GM/SCOOP powder Take 17 g by mouth daily as needed for mild constipation or moderate constipation.     [name] Kitchen glimepiride (AMARYL) 4 MG tablet Take 4 mg by mouth daily.    . GNP ASPIRIN LOW DOSE 81 MG EC tablet TAKE 1 TABLET BY MOUTH ONCE DAILY. 28 tablet 0  . hydrALAZINE (APRESOLINE) 25 MG tablet TAKE 1 TABLET BY MOUTH TWICE DAILY FOR HIGH BLOOD PRESSURE (Patient taking differently: Take 25 mg by mouth in the morning and at bedtime. ) 56 tablet 11  . insulin degludec (TRESIBA FLEXTOUCH) 100 UNIT/ML FlexTouch Pen Inject 20 Units into the skin at bedtime. 6 mL 3  . lidocaine-prilocaine (EMLA) cream Apply 1 application topically daily as needed (port).     . Magnesium Oxide 200 MG TABS Take 1 tablet (200 mg total) by mouth daily. (Patient taking differently: Take 100 mg by mouth daily. ) 20 tablet 0  .  metolazone (ZAROXOLYN) 5 MG tablet Take 1 tablet (5 mg total) by mouth daily. 30 tablet 1  . Multiple Vitamins-Minerals (TAB-A-VITE) TABS TAKE 1 TABLET BY MOUTH ONCE DAILY. (Patient taking differently: Take 1 tablet by mouth daily. ) 90 tablet 3  . multivitamin (RENA-VIT) TABS tablet Take 1 tablet by mouth at bedtime. (Patient taking differently: Take 1 tablet by mouth daily. ) 30 tablet 0  . omeprazole (PRILOSEC) 20 MG capsule Take 1 capsule (20 mg total) by mouth 2 (two) times daily before a meal. 180 capsule 2  . Risankizumab-rzaa (SKYRIZI) 150 MG/ML SOSY Inject 1 Syringe into  the skin as directed. Inject 1 syringe SQ every 12 weeks. 0.56 mL 0  . Risankizumab-rzaa (SKYRIZI) 150 MG/ML SOSY Inject 150 mg into the skin as directed. Every 12 weeks for maintenance. 1 mL 0  . SKYRIZI, 150 MG DOSE, 75 MG/0.83ML PSKT Inject 300 mg into the skin See admin instructions. Inject 300 mg into the skin every 4 months    . traZODone (DESYREL) 100 MG tablet Take one or one and half tab at night for sleep (Patient taking differently: Take 150 mg by mouth at bedtime as needed for sleep. Take one or one and half tab at night for sleep) 45 tablet 0  . Calcipotriene-Betameth Diprop (ENSTILAR) 0.005-0.064 % FOAM Apply to skin qd-bid (Patient not taking: Reported on 10/07/2020) 60 g 2  . isosorbide mononitrate (IMDUR) 60 MG 24 hr tablet TAKE 1 TABLET BY MOUTH ONCE DAILY. (Patient not taking: Reported on 10/07/2020) 28 tablet 11  . latanoprost (XALATAN) 0.005 % ophthalmic solution  (Patient not taking: Reported on 10/07/2020)    . oxyCODONE-acetaminophen (PERCOCET) 5-325 MG tablet Take 1-2 tablets by mouth every 6 (six) hours as needed for moderate pain or severe pain. (Patient not taking: Reported on 10/07/2020) 40 tablet 0   No current facility-administered medications for this visit.    Patient confirms/reports the following allergies:  Allergies  Allergen Reactions  . Shellfish Allergy Anaphylaxis    Face and throat swelling, difficulty breathing Allergy can be triggered by touching (contact)  . Betadine [Povidone Iodine] Rash  . Povidone-Iodine Rash    Orders Placed This Encounter  Procedures  . Procedural/ Surgical Case Request: COLONOSCOPY WITH PROPOFOL    Standing Status:   Standing    Number of Occurrences:   1    Order Specific Question:   Pre-op diagnosis    Answer:   screening colonoscopy    Order Specific Question:   CPT Code    Answer:   [phone]    Order Specific Question:   Special needs    Answer:   Patient uses oxygen 24 hours    AUTHORIZATION INFORMATION Primary  Insurance: 1D#: Group #:  Secondary Insurance: 1D#: Group #:  SCHEDULE INFORMATION: Date: 11/24/20 Time: Location:[facility]

## 2020-10-07 NOTE — Telephone Encounter (Signed)
This encounter was created in error - please disregard.

## 2020-10-10 NOTE — Telephone Encounter (Signed)
Left message to call back and ask to speak with pre-op team. 

## 2020-10-11 DIAGNOSIS — H401121 Primary open-angle glaucoma, left eye, mild stage: Secondary | ICD-10-CM | POA: Diagnosis not present

## 2020-10-11 DIAGNOSIS — E113393 Type 2 diabetes mellitus with moderate nonproliferative diabetic retinopathy without macular edema, bilateral: Secondary | ICD-10-CM | POA: Diagnosis not present

## 2020-10-18 NOTE — Telephone Encounter (Signed)
Unable to reach the reach, unable to leave a message either

## 2020-10-21 NOTE — Telephone Encounter (Signed)
Left message to call back and ask to speak with pre-op team. This was our 3rd attempt at calling him. Will also send MyChart message asking patient to call our office.

## 2020-10-25 NOTE — Telephone Encounter (Signed)
   Primary Cardiologist: Kate Sable, MD  Chart reviewed as part of pre-operative protocol coverage.  Left a voicemail for patient to call back for ongoing preop assessment.   Abigail Butts, PA-C 10/25/2020, 8:26 AM

## 2020-10-31 NOTE — Telephone Encounter (Signed)
FYI sent to requesting office we have not been able to reach pt for pre op. I will remove from the pre op call back pool.

## 2020-11-09 ENCOUNTER — Telehealth: Payer: Self-pay

## 2020-11-09 ENCOUNTER — Other Ambulatory Visit: Payer: Self-pay

## 2020-11-09 DIAGNOSIS — L409 Psoriasis, unspecified: Secondary | ICD-10-CM

## 2020-11-09 MED ORDER — SKYRIZI 150 MG/ML ~~LOC~~ SOSY: 150.0000 mg | PREFILLED_SYRINGE | SUBCUTANEOUS | 3 refills | Status: DC

## 2020-11-09 NOTE — Telephone Encounter (Signed)
Contacted patient to let him know that the cardiologist office has been trying to contact him to schedule an appt for cardiac clearance prior to colonoscopy.  Patient states that he's sorry he has not been able to get the messages because he doesn't have access to a computer, and he has also been going through some things.  I've provided the phone number to patient to contact the cardiologist office, and I will reschedule his colonoscopy for a later date once he has been cleared by cardiology.  Thanks,  [place], [place]

## 2020-11-14 NOTE — Progress Notes (Deleted)
Office Visit    Patient Name: [name] Date of Encounter: 11/14/2020  Primary Care Provider:  Lavera Guise, MD Primary Cardiologist:  Kate Sable, MD Electrophysiologist:  None   Chief Complaint    [name] is a 54 y.o. male with a hx of *** presents today for cardiovascular clearance for colonoscopy   Past Medical History    Past Medical History:  Diagnosis Date  . Bell's palsy   . Bell's palsy   . Cataract   . CHF (congestive heart failure) ([place])   . Chronic kidney disease    dialysis  . COPD (chronic obstructive pulmonary disease) ([place])   . Coronary artery disease   . Diabetes mellitus without complication ([place])   . Dyspnea    Wheezing  . GERD (gastroesophageal reflux disease)   . Gout   . HOH (hard of hearing)    mild  . Hypercholesterolemia   . Hypertension   . NSVT (nonsustained ventricular tachycardia) ([place])   . Obstructive sleep apnea    CPAP, O2 use continuosly at 3LPM  . Orthopnea   . Oxygen dependent    3 lpm continuous  . Pneumonia    In Past X2 most recent 1 yr ago  . Psoriasis   . Psoriasis   . Pulmonary HTN ([place])   . Renal failure    Past Surgical History:  Procedure Laterality Date  . AV FISTULA PLACEMENT Right 11/20/2019   Procedure: ARTERIOVENOUS (AV) FISTULA CREATION ( BRACHIAL CEPHALIC );  Surgeon: Katha Cabal, MD;  Location: [facility];  Service: Vascular;  Laterality: Right;  . CATARACT EXTRACTION W/PHACO Left 12/17/2019   Procedure: CATARACT EXTRACTION PHACO AND INTRAOCULAR LENS PLACEMENT ([place]) LEFT ISTENT INJ DIABETIC;  Surgeon: Eulogio Bear, MD;  Location: [facility];  Service: Ophthalmology;  Laterality: Left;  [place] 00:33.2 CDE 2.96 Fluid Pack Lot # [id number]  . CATARACT EXTRACTION W/PHACO Right 02/24/2020   Procedure: CATARACT EXTRACTION PHACO AND INTRAOCULAR LENS PLACEMENT (IOC) RIGHT DIABETIC ISTENT INJ;  Surgeon: Eulogio Bear, MD;  Location: [facility];  Service: Ophthalmology;  Laterality:  Right;  [place] 00:37.3 CDE 2.88 Fluid Pack Lot # [id number]  . CORONARY ANGIOPLASTY WITH STENT PLACEMENT     stent placement  . DIALYSIS/PERMA CATHETER INSERTION N/A 06/22/2019   Procedure: DIALYSIS/PERMA CATHETER INSERTION;  Surgeon: Algernon Huxley, MD;  Location: [place] [facility];  Service: Cardiovascular;  Laterality: N/A;  . DIALYSIS/PERMA CATHETER REMOVAL N/A 05/31/2020   Procedure: DIALYSIS/PERMA CATHETER REMOVAL;  Surgeon: Katha Cabal, MD;  Location: [place] [facility];  Service: Cardiovascular;  Laterality: N/A;  . INSERTION OF AHMED VALVE Left 12/17/2019   Procedure: INSERTION OF iSTENT;  Surgeon: Eulogio Bear, MD;  Location: [facility];  Service: Ophthalmology;  Laterality: Left;    Allergies  Allergies  Allergen Reactions  . Shellfish Allergy Anaphylaxis    Face and throat swelling, difficulty breathing Allergy can be triggered by touching (contact)  . Betadine [Povidone Iodine] Rash  . Povidone-Iodine Rash    History of Present Illness    [name] is a 54 y.o. male with a hx of HTN, DM2, CKD on HD, CAD s/p DES 2019, congestive heart failure last seen 04/07/20 by Dr. Garen Lah.  Echo 09/2019 with LVEF 45-50%. lexiscan 08/2019 with no evidence of ischemia. Seen by Dr. Garen Lah 04/07/20 for preoperative clearance for AV fistula placement.   EKGs/Labs/Other Studies Reviewed:   The following studies were reviewed today: Echocardiogram date 09/29/2019 1.  Left ventricular ejection fraction, by visual estimation, is 45 to 50%. The left ventricle has mildly decreased function. Mildly increased left ventricular size. There is moderately increased left ventricular hypertrophy.  2. Elevated mean left atrial pressure.  3. Left ventricular diastolic Doppler parameters are consistent with pseudonormalization pattern of LV diastolic filling.  4. Global right ventricle has normal systolic function.The right ventricular size is not well visualized. No increase in  right ventricular wall thickness.  5. Left atrial size was mildly dilated.  6. Right atrial size was not well visualized.  7. The pericardium was not well visualized.  8. The mitral valve is grossly normal. Trace mitral valve regurgitation.  9. The tricuspid valve is not well visualized. Tricuspid valve regurgitation is trivial. 10. The aortic valve was not well visualized Aortic valve regurgitation was not visualized by color flow Doppler. There is no aortic valve stenosis. 11. The pulmonic valve was not well visualized. Pulmonic valve regurgitation is not visualized by color flow Doppler. 12. TR signal is inadequate for assessing pulmonary artery systolic pressure. 13. The inferior vena cava is normal in size with greater than 50% respiratory variability, suggesting right atrial pressure of 3 mmHg. 14. The interatrial septum was not well visualized.   Lexiscan myocardial perfusion imaging test 09/10/2019 Pharmacological myocardial perfusion imaging study with no significant  Ischemia Small region fixed apical defect, likely secondary to attenuation artifact though unable to exclude small region of old infarct GI uptake artifact noted Mild global hypokinesis, EF estimated at 35% (depressed EF possibly exaggerated by GI uptake artifact) No EKG changes concerning for ischemia at peak stress or in recovery. Resting EKG with nonspecific ST abnormality CT attenuation corrected images with significant three-vessel coronary calcification, mild aortic atherosclerosis Low risk scan.      EKG:  EKG is ordered today.  The ekg ordered today demonstrates ***  Recent Labs: 11/21/2019: Platelets 207 02/24/2020: BUN 26; Creatinine, Ser 3.80; Hemoglobin 12.9; Potassium 3.9; Sodium 138  Recent Lipid Panel    Component Value Date/Time   CHOL 116 04/21/2019 1334   TRIG 81 04/21/2019 1334   HDL 37 (L) 04/21/2019 1334   CHOLHDL 3.7 10/08/2018 0955   LDLCALC 63 04/21/2019 1334    Home Medications    No outpatient medications have been marked as taking for the 11/15/20 encounter (Appointment) with Loel Dubonnet, NP.     Review of Systems   ***   ROS All other systems reviewed and are otherwise negative except as noted above.  Physical Exam    VS:  There were no vitals taken for this visit. , BMI There is no height or weight on file to calculate BMI.  Wt Readings from Last 3 Encounters:  09/08/20 270 lb 12.8 oz (122.8 kg)  05/31/20 270 lb (122.5 kg)  05/26/20 276 lb (125.2 kg)     GEN: Well nourished, well developed, in no acute distress. HEENT: normal. Neck: Supple, no JVD, carotid bruits, or masses. Cardiac: ***RRR, no murmurs, rubs, or gallops. No clubbing, cyanosis, edema.  ***Radials/DP/PT 2+ and equal bilaterally.  Respiratory:  ***Respirations regular and unlabored, clear to auscultation bilaterally. GI: Soft, nontender, nondistended. MS: No deformity or atrophy. Skin: Warm and dry, no rash. Neuro:  Strength and sensation are intact. Psych: Normal affect.  Assessment & Plan    1. Preoperative cardiovascular clearance -   2. CAD -   3. Systoli heart failure -   4. HTN -   5. ESRD -   Disposition: Follow up {follow  up:15908} with Dr. Garen Lah or APP   Signed, Loel Dubonnet, NP 11/14/2020, 9:30 PM [place]

## 2020-11-15 ENCOUNTER — Ambulatory Visit: Payer: Medicare Other | Admitting: Family

## 2020-11-15 NOTE — Telephone Encounter (Signed)
Thank you for the update Caitlin.

## 2020-11-15 NOTE — Telephone Encounter (Signed)
Has appt today to see Laurann Montana, NP for pre op clearance. I will send clearance notes to NP for appt today.

## 2020-11-15 NOTE — Telephone Encounter (Signed)
Hi Virgel Gess,  Just an Juluis Rainier that Mr. Shukla was a no-show for his cardiologist appointment today. I've CC'd our scheduling team here at the [facility] office so we can work on getting him rescheduled for cardiac clearance for colonoscopy.   Best, Loel Dubonnet, NP

## 2020-11-16 ENCOUNTER — Encounter: Payer: Self-pay | Admitting: Family

## 2020-11-16 NOTE — Telephone Encounter (Signed)
LVM for patient to reschedule.

## 2020-11-17 NOTE — Telephone Encounter (Signed)
Thank you! Appreciate the help.   Loel Dubonnet, NP

## 2020-11-17 NOTE — Telephone Encounter (Signed)
Patient called and stated he is moving this month and everything is rescheduled for January

## 2020-11-22 ENCOUNTER — Telehealth: Payer: Self-pay

## 2020-11-22 ENCOUNTER — Other Ambulatory Visit: Payer: Medicare Other

## 2020-11-22 NOTE — Telephone Encounter (Signed)
Pt will call back in January 2022 to reschedule his colonoscopy after he see's his cardiologist for clearance.  Thanks,  [place], [place]

## 2020-11-24 ENCOUNTER — Ambulatory Visit: Admit: 2020-11-24 | Payer: Medicare Other | Admitting: Gastroenterology

## 2020-11-24 ENCOUNTER — Other Ambulatory Visit: Payer: Self-pay | Admitting: Adult Health

## 2020-11-24 DIAGNOSIS — I270 Primary pulmonary hypertension: Secondary | ICD-10-CM

## 2020-11-24 SURGERY — COLONOSCOPY WITH PROPOFOL
Anesthesia: General

## 2020-11-29 ENCOUNTER — Other Ambulatory Visit: Payer: Self-pay | Admitting: Adult Health

## 2020-12-15 ENCOUNTER — Ambulatory Visit (INDEPENDENT_AMBULATORY_CARE_PROVIDER_SITE_OTHER): Payer: Medicare Other | Admitting: Podiatry

## 2020-12-15 ENCOUNTER — Other Ambulatory Visit: Payer: Self-pay

## 2020-12-15 ENCOUNTER — Encounter: Payer: Self-pay | Admitting: Podiatry

## 2020-12-15 DIAGNOSIS — I872 Venous insufficiency (chronic) (peripheral): Secondary | ICD-10-CM

## 2020-12-15 DIAGNOSIS — N186 End stage renal disease: Secondary | ICD-10-CM | POA: Diagnosis not present

## 2020-12-15 DIAGNOSIS — B351 Tinea unguium: Secondary | ICD-10-CM

## 2020-12-15 DIAGNOSIS — E1142 Type 2 diabetes mellitus with diabetic polyneuropathy: Secondary | ICD-10-CM

## 2020-12-15 DIAGNOSIS — M79676 Pain in unspecified toe(s): Secondary | ICD-10-CM

## 2020-12-15 NOTE — Progress Notes (Signed)
This patient returns to my office for at risk foot care.  This patient requires this care by a professional since this patient will be at risk due to having diabetes mellitus and ESRD.  This patient is unable to cut nails himself since the patient cannot reach his nails.These nails are painful walking and wearing shoes.  This patient presents for at risk foot care today. ° °General Appearance  Alert, conversant and in no acute stress. ° °Vascular  Dorsalis pedis and posterior tibial  pulses are weakly  palpable due to swelling. bilaterally.  Capillary return is within normal limits  bilaterally. Temperature is within normal limits  Bilaterally.  Venous stasis  B/L. ° °Neurologic  Senn-Weinstein monofilament wire test within normal limits  bilaterally. Muscle power within normal limits bilaterally. ° °Nails Thick disfigured discolored nails with subungual debris  from hallux to fifth toes bilaterally. No evidence of bacterial infection or drainage bilaterally. ° °Orthopedic  No limitations of motion  feet .  No crepitus or effusions noted.  No bony pathology or digital deformities noted. ° °Skin  normotropic skin with no porokeratosis noted bilaterally.  No signs of infections or ulcers noted.  Punctate psoriasis plantar aspect feet  B/l.  ° °Onychomycosis  Pain in right toes  Pain in left toes ° °Consent was obtained for treatment procedures.   Mechanical debridement of nails 1-5  bilaterally performed with a nail nipper.  Filed with dremel without incident.  ° ° °Return office visit  10 weeks                   Told patient to return for periodic foot care and evaluation due to potential at risk complications. ° ° °Geza Beranek DPM  °

## 2020-12-28 NOTE — Progress Notes (Signed)
Office Visit    Patient Name: [name] Date of Encounter: 12/29/2020  Primary Care Provider:  Lavera Guise, MD Primary Cardiologist:  Kate Sable, MD Electrophysiologist:  None   Chief Complaint    [name] is a 55 y.o. male with a hx of HTN, HF, DM2, ESRD presents today for cardiac clearance for colonoscopy.   Past Medical History    Past Medical History:  Diagnosis Date  . Bell's palsy   . Bell's palsy   . Cataract   . CHF (congestive heart failure) ([place])   . Chronic kidney disease    dialysis  . COPD (chronic obstructive pulmonary disease) ([place])   . Coronary artery disease   . Diabetes mellitus without complication ([place])   . Dyspnea    Wheezing  . GERD (gastroesophageal reflux disease)   . Gout   . HOH (hard of hearing)    mild  . Hypercholesterolemia   . Hypertension   . NSVT (nonsustained ventricular tachycardia) ([place])   . Obstructive sleep apnea    CPAP, O2 use continuosly at 3LPM  . Orthopnea   . Oxygen dependent    3 lpm continuous  . Pneumonia    In Past X2 most recent 1 yr ago  . Psoriasis   . Psoriasis   . Pulmonary HTN ([place])   . Renal failure    Past Surgical History:  Procedure Laterality Date  . AV FISTULA PLACEMENT Right 11/20/2019   Procedure: ARTERIOVENOUS (AV) FISTULA CREATION ( BRACHIAL CEPHALIC );  Surgeon: Katha Cabal, MD;  Location: [facility];  Service: Vascular;  Laterality: Right;  . CATARACT EXTRACTION W/PHACO Left 12/17/2019   Procedure: CATARACT EXTRACTION PHACO AND INTRAOCULAR LENS PLACEMENT ([place]) LEFT ISTENT INJ DIABETIC;  Surgeon: Eulogio Bear, MD;  Location: [facility];  Service: Ophthalmology;  Laterality: Left;  [place] 00:33.2 CDE 2.96 Fluid Pack Lot # [id number]  . CATARACT EXTRACTION W/PHACO Right 02/24/2020   Procedure: CATARACT EXTRACTION PHACO AND INTRAOCULAR LENS PLACEMENT (I[facility]) RIGHT DIABETIC ISTENT INJ;  Surgeon: Eulogio Bear, MD;  Location: [facility];  Service: Ophthalmology;   Laterality: Right;  [place] 00:37.3 CDE 2.88 Fluid Pack Lot # [id number]  . CORONARY ANGIOPLASTY WITH STENT PLACEMENT     stent placement  . DIALYSIS/PERMA CATHETER INSERTION N/A 06/22/2019   Procedure: DIALYSIS/PERMA CATHETER INSERTION;  Surgeon: Algernon Huxley, MD;  Location: [place] [facility];  Service: Cardiovascular;  Laterality: N/A;  . DIALYSIS/PERMA CATHETER REMOVAL N/A 05/31/2020   Procedure: DIALYSIS/PERMA CATHETER REMOVAL;  Surgeon: Katha Cabal, MD;  Location: [place] [facility];  Service: Cardiovascular;  Laterality: N/A;  . INSERTION OF AHMED VALVE Left 12/17/2019   Procedure: INSERTION OF iSTENT;  Surgeon: Eulogio Bear, MD;  Location: [facility];  Service: Ophthalmology;  Laterality: Left;    Allergies  Allergies  Allergen Reactions  . Shellfish Allergy Anaphylaxis    Face and throat swelling, difficulty breathing Allergy can be triggered by touching (contact)  . Betadine [Povidone Iodine] Rash  . Povidone-Iodine Rash    History of Present Illness    [name] is a 55 y.o. male with a hx of HTN, DM2, CKD on HD, CAD s/p DES 2019, congestive heart failure last seen 04/07/20 by Dr. Garen Lah.   Echo 09/2019 with LVEF 45-50%. lexiscan 08/2019 with no evidence of ischemia. Seen by Dr. Garen Lah 04/07/20 for preoperative clearance for AV fistula placement.   12/26/2020 lab work:  Ca 8.1, Ph 5.7, Hb 10.1, K  5.1  Presents today for follow-up and clearance for routine colonoscopy.  He reports feeling overall well.  Does reports significant stress with his housing situation as he is having to find a new apartment and finds it is difficult. Reports no shortness of breath nor dyspnea on exertion. Reports no chest pain, pressure, or tightness. No edema, orthopnea, PND.  He wears compression stockings daily.  Reports no palpitations.  Tells me dialysis sessions are going well and he has occasional low blood pressure at dialysis but it is asymptomatic and infrequent.  No  formal exercise routine but does try to stay active.  Endorses eating a low-salt, heart healthy diet.  EKGs/Labs/Other Studies Reviewed:   The following studies were reviewed today: Echocardiogram date 09/29/2019 1. Left ventricular ejection fraction, by visual estimation, is 45 to 50%. The left ventricle has mildly decreased function. Mildly increased left ventricular size. There is moderately increased left ventricular hypertrophy.  2. Elevated mean left atrial pressure.  3. Left ventricular diastolic Doppler parameters are consistent with pseudonormalization pattern of LV diastolic filling.  4. Global right ventricle has normal systolic function.The right ventricular size is not well visualized. No increase in right ventricular wall thickness.  5. Left atrial size was mildly dilated.  6. Right atrial size was not well visualized.  7. The pericardium was not well visualized.  8. The mitral valve is grossly normal. Trace mitral valve regurgitation.  9. The tricuspid valve is not well visualized. Tricuspid valve regurgitation is trivial. 10. The aortic valve was not well visualized Aortic valve regurgitation was not visualized by color flow Doppler. There is no aortic valve stenosis. 11. The pulmonic valve was not well visualized. Pulmonic valve regurgitation is not visualized by color flow Doppler. 12. TR signal is inadequate for assessing pulmonary artery systolic pressure. 13. The inferior vena cava is normal in size with greater than 50% respiratory variability, suggesting right atrial pressure of 3 mmHg. 14. The interatrial septum was not well visualized.   Lexiscan myocardial perfusion imaging test 09/10/2019 Pharmacological myocardial perfusion imaging study with no significant  Ischemia Small region fixed apical defect, likely secondary to attenuation artifact though unable to exclude small region of old infarct GI uptake artifact noted Mild global hypokinesis, EF estimated at 35%  (depressed EF possibly exaggerated by GI uptake artifact) No EKG changes concerning for ischemia at peak stress or in recovery. Resting EKG with nonspecific ST abnormality CT attenuation corrected images with significant three-vessel coronary calcification, mild aortic atherosclerosis Low risk scan.    EKG:  EKG is ordered today.  The ekg ordered today demonstrates NSR 96 bpm with prolonged QTc (Qt 380/QTc 480) with no acute ST/T wave changes.   Recent Labs: 02/24/2020: BUN 26; Creatinine, Ser 3.80; Hemoglobin 12.9; Potassium 3.9; Sodium 138  Recent Lipid Panel    Component Value Date/Time   CHOL 116 04/21/2019 1334   TRIG 81 04/21/2019 1334   HDL 37 (L) 04/21/2019 1334   CHOLHDL 3.7 10/08/2018 0955   LDLCALC 63 04/21/2019 1334     Home Medications   Current Meds  Medication Sig  . albuterol (PROVENTIL HFA;VENTOLIN HFA) 108 (90 Base) MCG/ACT inhaler Inhale 2 puffs into the lungs every 4 (four) hours as needed for wheezing or shortness of breath.  . allopurinol (ZYLOPRIM) 100 MG tablet Take by mouth.  [name] ELLIPTA 62.5-25 MCG/INH AEPB INHALE 1 PUFF INTO THE LUNGS DAILY. (Patient taking differently: Inhale 1 puff into the lungs daily.)  . atorvastatin (LIPITOR) 40 MG tablet Take  1 tablet (40 mg total) by mouth at bedtime.  . betamethasone dipropionate 0.05 % cream Apply topically as directed. Apply every AM for 2 weeks then decrease to every other day  . calcipotriene (DOVONOX) 0.005 % cream Apply topically as directed. Apply every PM for two weeks then every other day alternating with the betamethasone  . Calcipotriene-Betameth Diprop (ENSTILAR) 0.005-0.064 % FOAM Apply to skin qd-bid  . carvedilol (COREG) 25 MG tablet Take 1 tablet (25 mg total) by mouth every 12 (twelve) hours.  . Cholecalciferol (D3-1000) 25 MCG (1000 UT) tablet Take 2 tablets (2,000 Units total) by mouth daily.  . cyclobenzaprine (FLEXERIL) 5 MG tablet Take 1 tablet (5 mg total) by mouth 2 (two) times daily as  needed for muscle spasms.  . Dulaglutide (TRULICITY) 1.5 WH/6.7RF SOPN Inject 1.5 sq every week  . DUREZOL 0.05 % EMUL   . furosemide (LASIX) 40 MG tablet Take 1 tablet (40 mg total) by mouth 2 (two) times daily.  [name] Kitchen gabapentin (NEURONTIN) 600 MG tablet Take by mouth.  [name] Kitchen GAVILAX 17 GM/SCOOP powder Take 17 g by mouth daily as needed for mild constipation or moderate constipation.   [name] Kitchen glimepiride (AMARYL) 4 MG tablet Take 4 mg by mouth daily.  . GNP ASPIRIN LOW DOSE 81 MG EC tablet TAKE 1 TABLET BY MOUTH ONCE DAILY.  . hydrALAZINE (APRESOLINE) 25 MG tablet TAKE 1 TABLET BY MOUTH TWICE DAILY FOR HIGH BLOOD PRESSURE (Patient taking differently: Take 25 mg by mouth in the morning and at bedtime.)  . insulin degludec (TRESIBA FLEXTOUCH) 100 UNIT/ML FlexTouch Pen Inject 20 Units into the skin at bedtime.  . isosorbide mononitrate (IMDUR) 60 MG 24 hr tablet TAKE 1 TABLET BY MOUTH ONCE DAILY.  [name] Kitchen latanoprost (XALATAN) 0.005 % ophthalmic solution   . lidocaine-prilocaine (EMLA) cream Apply 1 application topically daily as needed (port).   . Magnesium Oxide 200 MG TABS Take 1 tablet (200 mg total) by mouth daily. (Patient taking differently: Take 100 mg by mouth daily.)  . metolazone (ZAROXOLYN) 5 MG tablet Take 1 tablet (5 mg total) by mouth daily.  . Multiple Vitamins-Minerals (TAB-A-VITE) TABS TAKE 1 TABLET BY MOUTH ONCE DAILY. (Patient taking differently: Take 1 tablet by mouth daily.)  . multivitamin (RENA-VIT) TABS tablet Take 1 tablet by mouth at bedtime. (Patient taking differently: Take 1 tablet by mouth daily.)  . omeprazole (PRILOSEC) 20 MG capsule Take 1 capsule (20 mg total) by mouth 2 (two) times daily before a meal.  . Risankizumab-rzaa (SKYRIZI) 150 MG/ML SOSY Inject 150 mg into the skin as directed. Every 12 weeks for maintenance.  . traZODone (DESYREL) 100 MG tablet Take one or one and half tab at night for sleep (Patient taking differently: Take 150 mg by mouth at bedtime as needed for  sleep. Take one or one and half tab at night for sleep)    Review of Systems  All other systems reviewed and are otherwise negative except as noted above.  Physical Exam    VS:  BP 108/78   Pulse 96   Ht 5\' 4"  (1.626 m)   Wt 270 lb (122.5 kg)   BMI 46.35 kg/m  , BMI Body mass index is 46.35 kg/m.  Wt Readings from Last 3 Encounters:  12/29/20 270 lb (122.5 kg)  09/08/20 270 lb 12.8 oz (122.8 kg)  05/31/20 270 lb (122.5 kg)    GEN: Well nourished, overweight, well developed, in no acute distress. HEENT: normal. Neck: Supple, no JVD, carotid bruits,  or masses. Cardiac: RRR, no murmurs, rubs, or gallops. No clubbing, cyanosis, edema.  Radials/DP/PT 2+ and equal bilaterally.  Respiratory:  Respirations regular and unlabored, clear to auscultation bilaterally. GI: Soft, nontender, nondistended. MS: No deformity or atrophy. Skin: Warm and dry, no rash. Neuro:  Strength and sensation are intact. Psych: Normal affect.  Assessment & Plan    1. Preoperative cardiovascular examination - According to the Revised Cardiac Risk Index (RCRI), his Perioperative Risk of Major Cardiac Event is (%): 11. His  Functional Capacity in METs is: 4.64 according to the [place] Activity Status Index (DASI).  Per AHA/ACC guidelines he would be acceptable risk for the planned procedure without prior cardiovascular testing.  We will route this note to his GI team via [facility] fax function.  2. CAD -stable with no anginal symptoms.  No negation for ischemic evaluation at this time.  EKG today with no acute ST/T wave changes.  GDMT includes aspirin, beta-blocker, Imdur, statin.  3. CHF - Euvolemic and well compensated on exam.  Continue volume management with HD.  Continue low-sodium diet and less than 2 L fluid restriction.  GDMT includes beta-blocker, hydralazine, Imdur.  Of note, he is on Lasix and metolazone prescribed by primary care provider.  Unclear benefit and the patient on dialysis and may be better to have  all volume management per HD.  Will defer to nephrology and PCP.  4. HTN - BP well controlled. Continue current antihypertensive regimen.   5. ESRD - Continue to follow with nephrology. Continue HD MWF.   Disposition: Follow up in 6 month(s) with Dr. Garen Lah or APP   Signed, Loel Dubonnet, NP 12/29/2020, 9:50 AM [place]

## 2020-12-29 ENCOUNTER — Other Ambulatory Visit: Payer: Self-pay

## 2020-12-29 ENCOUNTER — Encounter: Payer: Self-pay | Admitting: Family

## 2020-12-29 ENCOUNTER — Ambulatory Visit (INDEPENDENT_AMBULATORY_CARE_PROVIDER_SITE_OTHER): Payer: Medicare Other | Admitting: Family

## 2020-12-29 VITALS — BP 108/78 | HR 96 | Ht 64.0 in | Wt 270.0 lb

## 2020-12-29 DIAGNOSIS — N186 End stage renal disease: Secondary | ICD-10-CM | POA: Diagnosis not present

## 2020-12-29 DIAGNOSIS — I5022 Chronic systolic (congestive) heart failure: Secondary | ICD-10-CM

## 2020-12-29 DIAGNOSIS — I25118 Atherosclerotic heart disease of native coronary artery with other forms of angina pectoris: Secondary | ICD-10-CM

## 2020-12-29 DIAGNOSIS — Z01810 Encounter for preprocedural cardiovascular examination: Secondary | ICD-10-CM | POA: Diagnosis not present

## 2020-12-29 DIAGNOSIS — I10 Essential (primary) hypertension: Secondary | ICD-10-CM | POA: Diagnosis not present

## 2020-12-29 NOTE — Patient Instructions (Addendum)
Medication Instructions:  No medication changes today.   *If you need a refill on your cardiac medications before your next appointment, please call your pharmacy*  Lab Work: None ordered today.   Testing/Procedures: Your EKG today was stable compared to previous.   Follow-Up: At [facility], you and your health needs are our priority.  As part of our continuing mission to provide you with exceptional heart care, we have created designated Provider Care Teams.  These Care Teams include your primary Cardiologist (physician) and Advanced Practice Providers (APPs -  Physician Assistants and Nurse Practitioners) who all work together to provide you with the care you need, when you need it.   Your next appointment:   6 month(s)  The format for your next appointment:   In Person  Provider:   You may see Kate Sable, MD or one of the following Advanced Practice Providers on your designated Care Team:    Murray Hodgkins, NP  Christell Faith, PA-C  Marrianne Mood, PA-C  Cadence Kathlen Mody, [place]  Laurann Montana, NP  Other Instructions  Exercise recommendations: The American Heart Association recommends 150 minutes of moderate intensity exercise weekly. Try 30 minutes of moderate intensity exercise 4-5 times per week. This could include walking, jogging, or swimming.   Heart Healthy Diet Recommendations: A low-salt diet is recommended. Meats should be grilled, baked, or boiled. Avoid fried foods. Focus on lean protein sources like fish or chicken with vegetables and fruits. The American Heart Association is a Microbiologist!  American Heart Association Diet and Lifeystyle Recommendations   Loel Dubonnet, NP will send a note to GI that you are cleared for your colonoscopy.

## 2021-01-05 ENCOUNTER — Ambulatory Visit: Payer: Medicare Other | Admitting: Dermatology

## 2021-01-12 ENCOUNTER — Ambulatory Visit: Payer: Medicare Other | Admitting: Dermatology

## 2021-01-17 ENCOUNTER — Other Ambulatory Visit: Payer: Self-pay | Admitting: Adult Health

## 2021-01-27 ENCOUNTER — Inpatient Hospital Stay
Admission: EM | Admit: 2021-01-27 | Discharge: 2021-01-31 | DRG: 637 | Disposition: A | Discharge status: Home Health | Payer: Medicare Other | Attending: Internal Medicine | Admitting: Internal Medicine

## 2021-01-27 ENCOUNTER — Emergency Department: Payer: Medicare Other

## 2021-01-27 ENCOUNTER — Other Ambulatory Visit: Payer: Self-pay

## 2021-01-27 DIAGNOSIS — Z91013 Allergy to seafood: Secondary | ICD-10-CM

## 2021-01-27 DIAGNOSIS — K219 Gastro-esophageal reflux disease without esophagitis: Secondary | ICD-10-CM | POA: Diagnosis present

## 2021-01-27 DIAGNOSIS — E1142 Type 2 diabetes mellitus with diabetic polyneuropathy: Secondary | ICD-10-CM | POA: Diagnosis present

## 2021-01-27 DIAGNOSIS — G510 Bell's palsy: Secondary | ICD-10-CM | POA: Diagnosis present

## 2021-01-27 DIAGNOSIS — H9190 Unspecified hearing loss, unspecified ear: Secondary | ICD-10-CM | POA: Diagnosis present

## 2021-01-27 DIAGNOSIS — M1A09X0 Idiopathic chronic gout, multiple sites, without tophus (tophi): Secondary | ICD-10-CM | POA: Diagnosis present

## 2021-01-27 DIAGNOSIS — I429 Cardiomyopathy, unspecified: Secondary | ICD-10-CM | POA: Diagnosis present

## 2021-01-27 DIAGNOSIS — E662 Morbid (severe) obesity with alveolar hypoventilation: Secondary | ICD-10-CM | POA: Diagnosis present

## 2021-01-27 DIAGNOSIS — Z8249 Family history of ischemic heart disease and other diseases of the circulatory system: Secondary | ICD-10-CM

## 2021-01-27 DIAGNOSIS — R072 Precordial pain: Secondary | ICD-10-CM | POA: Diagnosis present

## 2021-01-27 DIAGNOSIS — Z20822 Contact with and (suspected) exposure to covid-19: Secondary | ICD-10-CM | POA: Diagnosis present

## 2021-01-27 DIAGNOSIS — D631 Anemia in chronic kidney disease: Secondary | ICD-10-CM | POA: Diagnosis present

## 2021-01-27 DIAGNOSIS — L409 Psoriasis, unspecified: Secondary | ICD-10-CM | POA: Diagnosis present

## 2021-01-27 DIAGNOSIS — Z794 Long term (current) use of insulin: Secondary | ICD-10-CM

## 2021-01-27 DIAGNOSIS — Z79899 Other long term (current) drug therapy: Secondary | ICD-10-CM

## 2021-01-27 DIAGNOSIS — Z6841 Body Mass Index (BMI) 40.0 and over, adult: Secondary | ICD-10-CM

## 2021-01-27 DIAGNOSIS — E8770 Fluid overload, unspecified: Secondary | ICD-10-CM | POA: Diagnosis present

## 2021-01-27 DIAGNOSIS — T796XXA Traumatic ischemia of muscle, initial encounter: Secondary | ICD-10-CM | POA: Diagnosis not present

## 2021-01-27 DIAGNOSIS — I10 Essential (primary) hypertension: Secondary | ICD-10-CM | POA: Diagnosis present

## 2021-01-27 DIAGNOSIS — Z992 Dependence on renal dialysis: Secondary | ICD-10-CM

## 2021-01-27 DIAGNOSIS — E162 Hypoglycemia, unspecified: Secondary | ICD-10-CM | POA: Diagnosis present

## 2021-01-27 DIAGNOSIS — E1122 Type 2 diabetes mellitus with diabetic chronic kidney disease: Secondary | ICD-10-CM | POA: Diagnosis present

## 2021-01-27 DIAGNOSIS — G4733 Obstructive sleep apnea (adult) (pediatric): Secondary | ICD-10-CM | POA: Diagnosis present

## 2021-01-27 DIAGNOSIS — J9621 Acute and chronic respiratory failure with hypoxia: Secondary | ICD-10-CM | POA: Diagnosis present

## 2021-01-27 DIAGNOSIS — G9340 Encephalopathy, unspecified: Secondary | ICD-10-CM | POA: Diagnosis present

## 2021-01-27 DIAGNOSIS — Z9109 Other allergy status, other than to drugs and biological substances: Secondary | ICD-10-CM

## 2021-01-27 DIAGNOSIS — M1A9XX0 Chronic gout, unspecified, without tophus (tophi): Secondary | ICD-10-CM | POA: Diagnosis present

## 2021-01-27 DIAGNOSIS — N2581 Secondary hyperparathyroidism of renal origin: Secondary | ICD-10-CM | POA: Diagnosis present

## 2021-01-27 DIAGNOSIS — R079 Chest pain, unspecified: Secondary | ICD-10-CM

## 2021-01-27 DIAGNOSIS — M545 Low back pain, unspecified: Secondary | ICD-10-CM

## 2021-01-27 DIAGNOSIS — Z955 Presence of coronary angioplasty implant and graft: Secondary | ICD-10-CM

## 2021-01-27 DIAGNOSIS — I272 Pulmonary hypertension, unspecified: Secondary | ICD-10-CM | POA: Diagnosis present

## 2021-01-27 DIAGNOSIS — E11649 Type 2 diabetes mellitus with hypoglycemia without coma: Secondary | ICD-10-CM | POA: Diagnosis not present

## 2021-01-27 DIAGNOSIS — R531 Weakness: Secondary | ICD-10-CM | POA: Diagnosis present

## 2021-01-27 DIAGNOSIS — E785 Hyperlipidemia, unspecified: Secondary | ICD-10-CM | POA: Diagnosis present

## 2021-01-27 DIAGNOSIS — I5032 Chronic diastolic (congestive) heart failure: Secondary | ICD-10-CM | POA: Diagnosis present

## 2021-01-27 DIAGNOSIS — I5043 Acute on chronic combined systolic (congestive) and diastolic (congestive) heart failure: Secondary | ICD-10-CM | POA: Diagnosis present

## 2021-01-27 DIAGNOSIS — I160 Hypertensive urgency: Secondary | ICD-10-CM

## 2021-01-27 DIAGNOSIS — J449 Chronic obstructive pulmonary disease, unspecified: Secondary | ICD-10-CM | POA: Diagnosis present

## 2021-01-27 DIAGNOSIS — Z9115 Patient's noncompliance with renal dialysis: Secondary | ICD-10-CM

## 2021-01-27 DIAGNOSIS — Z7982 Long term (current) use of aspirin: Secondary | ICD-10-CM

## 2021-01-27 DIAGNOSIS — Z841 Family history of disorders of kidney and ureter: Secondary | ICD-10-CM

## 2021-01-27 DIAGNOSIS — E11319 Type 2 diabetes mellitus with unspecified diabetic retinopathy without macular edema: Secondary | ICD-10-CM | POA: Diagnosis present

## 2021-01-27 DIAGNOSIS — I2510 Atherosclerotic heart disease of native coronary artery without angina pectoris: Secondary | ICD-10-CM | POA: Diagnosis present

## 2021-01-27 DIAGNOSIS — Y92009 Unspecified place in unspecified non-institutional (private) residence as the place of occurrence of the external cause: Secondary | ICD-10-CM

## 2021-01-27 DIAGNOSIS — W19XXXA Unspecified fall, initial encounter: Secondary | ICD-10-CM | POA: Diagnosis present

## 2021-01-27 DIAGNOSIS — N186 End stage renal disease: Secondary | ICD-10-CM | POA: Diagnosis present

## 2021-01-27 DIAGNOSIS — I959 Hypotension, unspecified: Secondary | ICD-10-CM | POA: Diagnosis not present

## 2021-01-27 DIAGNOSIS — Z9981 Dependence on supplemental oxygen: Secondary | ICD-10-CM

## 2021-01-27 DIAGNOSIS — I132 Hypertensive heart and chronic kidney disease with heart failure and with stage 5 chronic kidney disease, or end stage renal disease: Secondary | ICD-10-CM | POA: Diagnosis present

## 2021-01-27 DIAGNOSIS — I953 Hypotension of hemodialysis: Secondary | ICD-10-CM | POA: Diagnosis present

## 2021-01-27 LAB — CBC WITH DIFFERENTIAL/PLATELET
Abs Immature Granulocytes: 0.05 10*3/uL (ref 0.00–0.07)
Basophils Absolute: 0 10*3/uL (ref 0.0–0.1)
Basophils Relative: 0 %
Eosinophils Absolute: 0 10*3/uL (ref 0.0–0.5)
Eosinophils Relative: 0 %
HCT: 31.4 % — ABNORMAL LOW (ref 39.0–52.0)
Hemoglobin: 10 g/dL — ABNORMAL LOW (ref 13.0–17.0)
Immature Granulocytes: 0 %
Lymphocytes Relative: 14 %
Lymphs Abs: 1.5 10*3/uL (ref 0.7–4.0)
MCH: 29.9 pg (ref 26.0–34.0)
MCHC: 31.8 g/dL (ref 30.0–36.0)
MCV: 93.7 fL (ref 80.0–100.0)
Monocytes Absolute: 0.8 10*3/uL (ref 0.1–1.0)
Monocytes Relative: 7 %
Neutro Abs: 8.7 10*3/uL — ABNORMAL HIGH (ref 1.7–7.7)
Neutrophils Relative %: 79 %
Platelets: 216 10*3/uL (ref 150–400)
RBC: 3.35 MIL/uL — ABNORMAL LOW (ref 4.22–5.81)
RDW: 15.8 % — ABNORMAL HIGH (ref 11.5–15.5)
WBC: 11.2 10*3/uL — ABNORMAL HIGH (ref 4.0–10.5)
nRBC: 0 % (ref 0.0–0.2)

## 2021-01-27 LAB — COMPREHENSIVE METABOLIC PANEL
ALT: 26 U/L (ref 0–44)
AST: 67 U/L — ABNORMAL HIGH (ref 15–41)
Albumin: 3.9 g/dL (ref 3.5–5.0)
Alkaline Phosphatase: 150 U/L — ABNORMAL HIGH (ref 38–126)
Anion gap: 15 (ref 5–15)
BUN: 56 mg/dL — ABNORMAL HIGH (ref 6–20)
CO2: 23 mmol/L (ref 22–32)
Calcium: 9 mg/dL (ref 8.9–10.3)
Chloride: 105 mmol/L (ref 98–111)
Creatinine, Ser: 8.74 mg/dL — ABNORMAL HIGH (ref 0.61–1.24)
GFR, Estimated: 7 mL/min — ABNORMAL LOW (ref >60)
Glucose, Bld: 123 mg/dL — ABNORMAL HIGH (ref 70–99)
Potassium: 4.7 mmol/L (ref 3.5–5.1)
Sodium: 143 mmol/L (ref 135–145)
Total Bilirubin: 1.1 mg/dL (ref 0.3–1.2)
Total Protein: 8.5 g/dL — ABNORMAL HIGH (ref 6.5–8.1)

## 2021-01-27 LAB — TROPONIN I (HIGH SENSITIVITY): Troponin I (High Sensitivity): 70 ng/L — ABNORMAL HIGH (ref <18)

## 2021-01-27 LAB — CBG MONITORING, ED
Glucose-Capillary: 115 mg/dL — ABNORMAL HIGH (ref 70–99)
Glucose-Capillary: 33 mg/dL — CL (ref 70–99)
Glucose-Capillary: 152 mg/dL — ABNORMAL HIGH (ref 70–99)

## 2021-01-27 LAB — RESP PANEL BY RT-PCR (FLU A&B, COVID) ARPGX2
Influenza A by PCR: NEGATIVE
Influenza B by PCR: NEGATIVE
SARS Coronavirus 2 by RT PCR: NEGATIVE

## 2021-01-27 LAB — LACTIC ACID, PLASMA: Lactic Acid, Venous: 1.3 mmol/L (ref 0.5–1.9)

## 2021-01-27 LAB — CK: Total CK: 3336 U/L — ABNORMAL HIGH (ref 49–397)

## 2021-01-27 MED ORDER — POLYETHYLENE GLYCOL 3350 17 GM/SCOOP PO POWD
17.0000 g | Freq: Every day | ORAL | Status: DC | PRN
  Filled 2021-01-27: qty 255

## 2021-01-27 MED ORDER — ALLOPURINOL 100 MG PO TABS
100.0000 mg | ORAL_TABLET | Freq: Every day | ORAL | Status: DC
  Administered 2021-01-28 – 2021-01-31 (×4): 100 mg via ORAL
  Filled 2021-01-27 (×4): qty 1

## 2021-01-27 MED ORDER — HEPARIN SODIUM (PORCINE) 5000 UNIT/ML IJ SOLN
5000.0000 [IU] | Freq: Three times a day (TID) | INTRAMUSCULAR | Status: DC
  Administered 2021-01-28 – 2021-01-31 (×11): 5000 [IU] via SUBCUTANEOUS
  Filled 2021-01-27 (×10): qty 1

## 2021-01-27 MED ORDER — MAGNESIUM HYDROXIDE 400 MG/5ML PO SUSP
30.0000 mL | Freq: Every day | ORAL | Status: DC | PRN
  Filled 2021-01-27: qty 30

## 2021-01-27 MED ORDER — TAB-A-VITE PO TABS: 1.0000 | ORAL_TABLET | Freq: Every day | ORAL | Status: DC

## 2021-01-27 MED ORDER — HYDRALAZINE HCL 50 MG PO TABS
25.0000 mg | ORAL_TABLET | Freq: Two times a day (BID) | ORAL | Status: DC
  Administered 2021-01-28 (×2): 25 mg via ORAL
  Filled 2021-01-27 (×2): qty 1

## 2021-01-27 MED ORDER — CYCLOBENZAPRINE HCL 10 MG PO TABS
5.0000 mg | ORAL_TABLET | Freq: Two times a day (BID) | ORAL | Status: DC | PRN
  Filled 2021-01-27: qty 0.5

## 2021-01-27 MED ORDER — VITAMIN D3 25 MCG (1000 UNIT) PO TABS
2000.0000 [IU] | ORAL_TABLET | Freq: Every day | ORAL | Status: DC
  Administered 2021-01-28 – 2021-01-31 (×4): 2000 [IU] via ORAL
  Filled 2021-01-27 (×8): qty 2

## 2021-01-27 MED ORDER — NITROGLYCERIN 0.4 MG SL SUBL: 0.4000 mg | SUBLINGUAL_TABLET | SUBLINGUAL | Status: DC | PRN

## 2021-01-27 MED ORDER — DEXTROSE 50 % IV SOLN
1.0000 | Freq: Once | INTRAVENOUS | Status: AC
  Administered 2021-01-27: 50 mL via INTRAVENOUS

## 2021-01-27 MED ORDER — ONDANSETRON HCL 4 MG PO TABS: 4.0000 mg | ORAL_TABLET | Freq: Four times a day (QID) | ORAL | Status: DC | PRN

## 2021-01-27 MED ORDER — SODIUM CHLORIDE 0.9 % IV SOLN: INTRAVENOUS | Status: DC

## 2021-01-27 MED ORDER — LATANOPROST 0.005 % OP SOLN
1.0000 [drp] | Freq: Every day | OPHTHALMIC | Status: DC
  Administered 2021-01-28: 1 [drp] via OPHTHALMIC
  Filled 2021-01-27 (×2): qty 2.5

## 2021-01-27 MED ORDER — PANTOPRAZOLE SODIUM 40 MG PO TBEC
40.0000 mg | DELAYED_RELEASE_TABLET | Freq: Every day | ORAL | Status: DC
  Administered 2021-01-28 – 2021-01-31 (×4): 40 mg via ORAL
  Filled 2021-01-27 (×4): qty 1

## 2021-01-27 MED ORDER — GABAPENTIN 600 MG PO TABS
300.0000 mg | ORAL_TABLET | Freq: Every day | ORAL | Status: DC
  Administered 2021-01-28 – 2021-01-30 (×4): 300 mg via ORAL
  Filled 2021-01-27 (×2): qty 1
  Filled 2021-01-27 (×2): qty 0.5
  Filled 2021-01-27: qty 1

## 2021-01-27 MED ORDER — ONDANSETRON HCL 4 MG/2ML IJ SOLN: 4.0000 mg | Freq: Four times a day (QID) | INTRAMUSCULAR | Status: DC | PRN

## 2021-01-27 MED ORDER — MORPHINE SULFATE (PF) 2 MG/ML IV SOLN: 2.0000 mg | INTRAVENOUS | Status: DC | PRN

## 2021-01-27 MED ORDER — TRAZODONE HCL 50 MG PO TABS: 150.0000 mg | ORAL_TABLET | Freq: Every evening | ORAL | Status: DC | PRN

## 2021-01-27 MED ORDER — ASPIRIN EC 81 MG PO TBEC
81.0000 mg | DELAYED_RELEASE_TABLET | Freq: Every day | ORAL | Status: DC
  Administered 2021-01-28 – 2021-01-31 (×4): 81 mg via ORAL
  Filled 2021-01-27 (×4): qty 1

## 2021-01-27 MED ORDER — MAGNESIUM OXIDE 400 (241.3 MG) MG PO TABS
200.0000 mg | ORAL_TABLET | Freq: Every day | ORAL | Status: DC
  Administered 2021-01-28 – 2021-01-31 (×4): 200 mg via ORAL
  Filled 2021-01-27 (×4): qty 1

## 2021-01-27 MED ORDER — ALBUTEROL SULFATE HFA 108 (90 BASE) MCG/ACT IN AERS
2.0000 | INHALATION_SPRAY | RESPIRATORY_TRACT | Status: DC | PRN
  Filled 2021-01-27: qty 6.7

## 2021-01-27 MED ORDER — ACETAMINOPHEN 325 MG PO TABS: 650.0000 mg | ORAL_TABLET | Freq: Four times a day (QID) | ORAL | Status: DC | PRN

## 2021-01-27 MED ORDER — UMECLIDINIUM-VILANTEROL 62.5-25 MCG/INH IN AEPB
1.0000 | INHALATION_SPRAY | Freq: Every day | RESPIRATORY_TRACT | Status: DC
  Administered 2021-01-28 – 2021-01-31 (×4): 1 via RESPIRATORY_TRACT
  Filled 2021-01-27: qty 14

## 2021-01-27 MED ORDER — ISOSORBIDE MONONITRATE ER 30 MG PO TB24
60.0000 mg | ORAL_TABLET | Freq: Every day | ORAL | Status: DC
  Administered 2021-01-28: 60 mg via ORAL
  Filled 2021-01-27: qty 1

## 2021-01-27 MED ORDER — ACETAMINOPHEN 650 MG RE SUPP: 650.0000 mg | Freq: Four times a day (QID) | RECTAL | Status: DC | PRN

## 2021-01-27 MED ORDER — CARVEDILOL 12.5 MG PO TABS
25.0000 mg | ORAL_TABLET | Freq: Two times a day (BID) | ORAL | Status: DC
  Administered 2021-01-28 (×2): 25 mg via ORAL
  Filled 2021-01-27 (×2): qty 1

## 2021-01-27 MED ORDER — ATORVASTATIN CALCIUM 20 MG PO TABS
40.0000 mg | ORAL_TABLET | Freq: Every day | ORAL | Status: DC
  Administered 2021-01-28: 40 mg via ORAL
  Filled 2021-01-27: qty 2

## 2021-01-27 MED ORDER — METOLAZONE 5 MG PO TABS
5.0000 mg | ORAL_TABLET | Freq: Every day | ORAL | Status: DC
  Administered 2021-01-28: 5 mg via ORAL
  Filled 2021-01-27: qty 1

## 2021-01-27 MED ORDER — RENA-VITE PO TABS
1.0000 | ORAL_TABLET | Freq: Every day | ORAL | Status: DC
  Administered 2021-01-28 – 2021-01-31 (×4): 1 via ORAL
  Filled 2021-01-27 (×4): qty 1

## 2021-01-27 MED ORDER — HYDROMORPHONE HCL 1 MG/ML IJ SOLN
0.5000 mg | Freq: Once | INTRAMUSCULAR | Status: AC
Start: 2021-01-27 — End: 2021-01-27
  Administered 2021-01-27: 0.5 mg via INTRAVENOUS
  Filled 2021-01-27: qty 1

## 2021-01-27 MED ORDER — CALCIPOTRIENE 0.005 % EX CREA: TOPICAL_CREAM | CUTANEOUS | Status: DC

## 2021-01-27 MED ORDER — FUROSEMIDE 20 MG PO TABS
40.0000 mg | ORAL_TABLET | Freq: Two times a day (BID) | ORAL | Status: DC
  Administered 2021-01-28: 40 mg via ORAL
  Filled 2021-01-27: qty 1

## 2021-01-27 MED ORDER — LIDOCAINE-PRILOCAINE 2.5-2.5 % EX CREA
1.0000 "application " | TOPICAL_CREAM | Freq: Every day | CUTANEOUS | Status: DC | PRN
  Filled 2021-01-27: qty 5

## 2021-01-27 NOTE — ED Triage Notes (Signed)
Pt presents to ER via ems from home.  Pt was found by [employer] face down in floor.  Pt has reportedly been down for 2 days since last dialysis treatment on Wednesday.  BGL with ems was 42.  Pt received 2 mg glucagon IM w/ems.  Pt endorses chest pain at this time.

## 2021-01-27 NOTE — ED Provider Notes (Signed)
[facility] Emergency Department Provider Note  ____________________________________________   Event Date/Time   First MD Initiated Contact with Patient 01/27/21 2048     (approximate)  I have reviewed the triage vital signs and the nursing notes.   HISTORY  Chief Complaint Hypoglycemia    HPI [name] is a 55 y.o. male with history of ESRD, hypertension, hyperlipidemia, here with generalized weakness.  The patient states that he fell in his house, facedown, 2 days ago.  He states that he has been unable to get himself back up after that, and has been laying in his house.  He missed dialysis today so his friend came to check on him which is how he arrived today.  He states he has chest pain from where he was laying down.  He feels very weak.  He states that he has had generalized weakness and shortness of breath.  He has had some diffuse body aches.  Denies any fevers.  No recent medication changes.  He does have some moderate knee pain that began after the fall.  No other acute complaints.        Past Medical History:  Diagnosis Date  . Bell's palsy   . Bell's palsy   . Cataract   . CHF (congestive heart failure) ([place])   . Chronic kidney disease    dialysis  . COPD (chronic obstructive pulmonary disease) ([place])   . Coronary artery disease   . Diabetes mellitus without complication ([place])   . Dyspnea    Wheezing  . GERD (gastroesophageal reflux disease)   . Gout   . HOH (hard of hearing)    mild  . Hypercholesterolemia   . Hypertension   . NSVT (nonsustained ventricular tachycardia) ([place])   . Obstructive sleep apnea    CPAP, O2 use continuosly at 3LPM  . Orthopnea   . Oxygen dependent    3 lpm continuous  . Pneumonia    In Past X2 most recent 1 yr ago  . Psoriasis   . Psoriasis   . Pulmonary HTN ([place])   . Renal failure     Patient Active Problem List   Diagnosis Date Noted  . Hemodialysis patient ([place]) 06/28/2020  .  Complication from renal dialysis device 03/09/2020  . Background diabetic retinopathy associated with type 2 diabetes mellitus ([place]) 10/27/2019  . Hyperkalemia 06/15/2019  . Type 2 diabetes mellitus with hyperglycemia, with long-term current use of insulin ([place]) 03/05/2019  . Hypoglycemia 02/18/2019  . Altered mental status 02/18/2019  . RLQ abdominal mass 01/20/2019  . CHF (congestive heart failure) ([place]) 10/07/2018  . Muscle cramps 10/07/2018  . Shortness of breath 09/08/2018  . Morbid obesity ([place]) 06/24/2018  . Acute on chronic respiratory failure with hypoxia and hypercapnia ([place]) 04/26/2018  . Morbid obesity with BMI of 50.0-59.9, adult ([place]) 04/23/2018  . Chronic diastolic heart failure ([place]) 02/10/2018  . Obstructive sleep apnea 02/10/2018  . Obesity hypoventilation syndrome ([place]) 09/26/2017  . Diabetes mellitus without complication ([place]) 82/99/3716  . CAD (coronary artery disease) 01/31/2017  . Hyperlipidemia, unspecified 01/31/2017  . Essential hypertension 01/17/2017  . Psoriasis 01/17/2017  . Chronic gout of multiple sites 12/21/2016  . DM type 2 with diabetic peripheral neuropathy ([place]) 06/25/2016  . Coronary artery disease involving native coronary artery 06/14/2015  . Cardiomyopathy ([place]) 08/19/2012  . End stage renal disease ([place]) 08/19/2012  . Hyperlipidemia associated with type 2 diabetes mellitus ([place]) 12/17/1998    Past Surgical History:  Procedure  Laterality Date  . AV FISTULA PLACEMENT Right 11/20/2019   Procedure: ARTERIOVENOUS (AV) FISTULA CREATION ( BRACHIAL CEPHALIC );  Surgeon: Katha Cabal, MD;  Location: [facility] ORS;  Service: Vascular;  Laterality: Right;  . CATARACT EXTRACTION W/PHACO Left 12/17/2019   Procedure: CATARACT EXTRACTION PHACO AND INTRAOCULAR LENS PLACEMENT ([place]) LEFT ISTENT INJ DIABETIC;  Surgeon: Eulogio Bear, MD;  Location: [facility] ORS;  Service: Ophthalmology;  Laterality: Left;  [place] 00:33.2 CDE 2.96 Fluid Pack Lot # [id number]  .  CATARACT EXTRACTION W/PHACO Right 02/24/2020   Procedure: CATARACT EXTRACTION PHACO AND INTRAOCULAR LENS PLACEMENT (IOC) RIGHT DIABETIC ISTENT INJ;  Surgeon: Eulogio Bear, MD;  Location: [facility] ORS;  Service: Ophthalmology;  Laterality: Right;  [place] 00:37.3 CDE 2.88 Fluid Pack Lot # [id number]  . CORONARY ANGIOPLASTY WITH STENT PLACEMENT     stent placement  . DIALYSIS/PERMA CATHETER INSERTION N/A 06/22/2019   Procedure: DIALYSIS/PERMA CATHETER INSERTION;  Surgeon: Algernon Huxley, MD;  Location: [place] [facility];  Service: Cardiovascular;  Laterality: N/A;  . DIALYSIS/PERMA CATHETER REMOVAL N/A 05/31/2020   Procedure: DIALYSIS/PERMA CATHETER REMOVAL;  Surgeon: Katha Cabal, MD;  Location: [place] [facility];  Service: Cardiovascular;  Laterality: N/A;  . INSERTION OF AHMED VALVE Left 12/17/2019   Procedure: INSERTION OF iSTENT;  Surgeon: Eulogio Bear, MD;  Location: [facility] ORS;  Service: Ophthalmology;  Laterality: Left;    Prior to Admission medications   Medication Sig Start Date End Date Taking? Authorizing Provider  albuterol (PROVENTIL HFA;VENTOLIN HFA) 108 (90 Base) MCG/ACT inhaler Inhale 2 puffs into the lungs every 4 (four) hours as needed for wheezing or shortness of breath. 01/20/19  Yes Doles-Johnson, Teah, NP  allopurinol (ZYLOPRIM) 100 MG tablet Take 100 mg by mouth daily. 09/23/20 09/23/21 Yes [provider]  ANORO ELLIPTA 62.5-25 MCG/INH AEPB INHALE 1 PUFF INTO THE LUNGS DAILY. Patient taking differently: Inhale 1 puff into the lungs daily. 11/30/19  Yes Scarboro, Audie Clear, NP  atorvastatin (LIPITOR) 40 MG tablet Take 1 tablet (40 mg total) by mouth at bedtime. 03/21/20  Yes Scarboro, Audie Clear, NP  betamethasone dipropionate 0.05 % cream Apply topically as directed. Apply every AM for 2 weeks then decrease to every other day 10/04/20  Yes Ralene Bathe, MD  calcipotriene (DOVONOX) 0.005 % cream Apply topically as directed. Apply every PM for two weeks then every  other day alternating with the betamethasone 10/04/20  Yes Ralene Bathe, MD  Calcipotriene-Betameth Diprop (ENSTILAR) 0.005-0.064 % FOAM Apply to skin qd-bid 09/22/20  Yes Ralene Bathe, MD  calcium acetate (PHOSLO) 667 MG capsule Take 667 mg by mouth 3 (three) times daily. 01/17/21  Yes [provider]  carvedilol (COREG) 25 MG tablet Take 1 tablet (25 mg total) by mouth every 12 (twelve) hours. 03/21/20  Yes Scarboro, Audie Clear, NP  Cholecalciferol (D3-1000) 25 MCG (1000 UT) tablet Take 2 tablets (2,000 Units total) by mouth daily. 01/20/19  Yes Doles-Johnson, Teah, NP  Dulaglutide (TRULICITY) 1.5 KG/2.5KY SOPN Inject 1.5 sq every week 08/30/20  Yes Lavera Guise, MD  furosemide (LASIX) 40 MG tablet Take 1 tablet (40 mg total) by mouth 2 (two) times daily. 08/30/20  Yes Lavera Guise, MD  gabapentin (NEURONTIN) 300 MG capsule Take 300 mg by mouth daily. 01/17/21  Yes [provider]  GAVILAX 17 GM/SCOOP powder Take 17 g by mouth daily as needed for mild constipation or moderate constipation.  06/29/19  Yes [provider]  glimepiride (AMARYL)  4 MG tablet Take 4 mg by mouth daily. 11/27/19  Yes [provider]  GNP ASPIRIN LOW DOSE 81 MG EC tablet TAKE 1 TABLET BY MOUTH ONCE DAILY. 09/23/20  Yes Scarboro, Audie Clear, NP  hydrALAZINE (APRESOLINE) 100 MG tablet Take 100 mg by mouth 3 (three) times daily. 01/17/21  Yes [provider]  insulin degludec (TRESIBA FLEXTOUCH) 100 UNIT/ML FlexTouch Pen Inject 20 Units into the skin at bedtime. 08/30/20  Yes Lavera Guise, MD  isosorbide mononitrate (IMDUR) 60 MG 24 hr tablet TAKE 1 TABLET BY MOUTH ONCE DAILY. 08/30/20  Yes Lavera Guise, MD  Magnesium Oxide 200 MG TABS Take 1 tablet (200 mg total) by mouth daily. Patient taking differently: Take 100 mg by mouth daily. 01/12/20  Yes Scarboro, Audie Clear, NP  metolazone (ZAROXOLYN) 5 MG tablet Take 1 tablet (5 mg total) by mouth daily. 01/12/20  Yes Scarboro, Audie Clear, NP  Multiple  Vitamins-Minerals (TAB-A-VITE) TABS TAKE 1 TABLET BY MOUTH ONCE DAILY. Patient taking differently: Take 1 tablet by mouth daily. 02/19/20  Yes Scarboro, Audie Clear, NP  multivitamin (RENA-VIT) TABS tablet Take 1 tablet by mouth at bedtime. Patient taking differently: Take 1 tablet by mouth daily. 06/25/19  Yes Ojie, Jude, MD  omeprazole (PRILOSEC) 20 MG capsule Take 1 capsule (20 mg total) by mouth 2 (two) times daily before a meal. 02/19/20  Yes Scarboro, Audie Clear, NP  Risankizumab-rzaa (SKYRIZI) 150 MG/ML SOSY Inject 150 mg into the skin as directed. Every 12 weeks for maintenance. 11/09/20  Yes Ralene Bathe, MD  traZODone (DESYREL) 150 MG tablet Take by mouth at bedtime.   Yes [provider]  DUREZOL 0.05 % EMUL  04/14/20   [provider]  latanoprost (XALATAN) 0.005 % ophthalmic solution  08/30/20   [provider]  lidocaine-prilocaine (EMLA) cream Apply 1 application topically daily as needed (port).  05/18/20   [provider]    Allergies Shellfish allergy, Betadine [povidone iodine], and Povidone-iodine  Family History  Problem Relation Age of Onset  . Heart failure Mother   . Kidney failure Brother     Social History Social History   Tobacco Use  . Smoking status: Never Smoker  . Smokeless tobacco: Never Used  Vaping Use  . Vaping Use: Never used  Substance Use Topics  . Alcohol use: Not Currently    Alcohol/week: 1.0 standard drink    Types: 1 Cans of beer per week    Comment: occasional  . Drug use: Not Currently    Types: Marijuana    Comment: in high school     Review of Systems  Review of Systems  Constitutional: Positive for fatigue. Negative for chills and fever.  HENT: Negative for sore throat.   Respiratory: Negative for shortness of breath.   Cardiovascular: Negative for chest pain.  Gastrointestinal: Negative for abdominal pain.  Genitourinary: Negative for flank pain.  Musculoskeletal: Positive for arthralgias. Negative  for neck pain.  Skin: Negative for rash and wound.  Allergic/Immunologic: Negative for immunocompromised state.  Neurological: Positive for weakness. Negative for numbness.  Hematological: Does not bruise/bleed easily.  All other systems reviewed and are negative.    ____________________________________________  PHYSICAL EXAM:      VITAL SIGNS: ED Triage Vitals  Enc Vitals Group     BP 01/27/21 2041 (!) 198/88     Pulse Rate 01/27/21 2041 78     Resp 01/27/21 2041 17     Temp --  Temp src --      SpO2 01/27/21 2041 100 %     Weight 01/27/21 2043 266 lb 15.6 oz (121.1 kg)     Height 01/27/21 2043 5\' 4"  (1.626 m)     Head Circumference --      Peak Flow --      Pain Score 01/27/21 2042 9     Pain Loc --      Pain Edu? --      Excl. in [place]? --      Physical Exam Vitals and nursing note reviewed.  Constitutional:      General: He is not in acute distress.    Appearance: He is well-developed and well-nourished.  HENT:     Head: Normocephalic and atraumatic.     Mouth/Throat:     Mouth: Mucous membranes are dry.  Eyes:     Conjunctiva/sclera: Conjunctivae normal.  Cardiovascular:     Rate and Rhythm: Normal rate and regular rhythm.     Heart sounds: Normal heart sounds. No murmur heard. No friction rub.  Pulmonary:     Effort: Pulmonary effort is normal. No respiratory distress.     Breath sounds: Examination of the right-lower field reveals rales. Examination of the left-lower field reveals rales. Rales present. No wheezing.  Chest:     Comments: Tenderness to palpation over the anterior chest wall, with mild erythema from pressure.  No open wounds Abdominal:     General: There is no distension.     Palpations: Abdomen is soft.     Tenderness: There is no abdominal tenderness.  Musculoskeletal:        General: No edema.     Cervical back: Neck supple.  Skin:    General: Skin is warm.     Capillary Refill: Capillary refill takes less than 2 seconds.      Findings: No rash.     Comments: Erythema noted to the bilateral anterior knees.  No induration.  Neurological:     Mental Status: He is alert and oriented to person, place, and time.     Motor: No abnormal muscle tone.       ____________________________________________   LABS (all labs ordered are listed, but only abnormal results are displayed)  Labs Reviewed  CBC WITH DIFFERENTIAL/PLATELET - Abnormal; Notable for the following components:      Result Value   WBC 11.2 (*)    RBC 3.35 (*)    Hemoglobin 10.0 (*)    HCT 31.4 (*)    RDW 15.8 (*)    Neutro Abs 8.7 (*)    All other components within normal limits  COMPREHENSIVE METABOLIC PANEL - Abnormal; Notable for the following components:   Glucose, Bld 123 (*)    BUN 56 (*)    Creatinine, Ser 8.74 (*)    Total Protein 8.5 (*)    AST 67 (*)    Alkaline Phosphatase 150 (*)    GFR, Estimated 7 (*)    All other components within normal limits  CK - Abnormal; Notable for the following components:   Total CK 3,336 (*)    All other components within normal limits  CBG MONITORING, ED - Abnormal; Notable for the following components:   Glucose-Capillary 33 (*)    All other components within normal limits  CBG MONITORING, ED - Abnormal; Notable for the following components:   Glucose-Capillary 115 (*)    All other components within normal limits  CBG MONITORING, ED - Abnormal; Notable for  the following components:   Glucose-Capillary 152 (*)    All other components within normal limits  CBG MONITORING, ED - Abnormal; Notable for the following components:   Glucose-Capillary 64 (*)    All other components within normal limits  TROPONIN I (HIGH SENSITIVITY) - Abnormal; Notable for the following components:   Troponin I (High Sensitivity) 70 (*)    All other components within normal limits  TROPONIN I (HIGH SENSITIVITY) - Abnormal; Notable for the following components:   Troponin I (High Sensitivity) 61 (*)    All other  components within normal limits  RESP PANEL BY RT-PCR (FLU A&B, COVID) ARPGX2  CULTURE, BLOOD (SINGLE)  LACTIC ACID, PLASMA  BLOOD GAS, VENOUS  BASIC METABOLIC PANEL  CBC  CK  CK  HEMOGLOBIN A1C  TROPONIN I (HIGH SENSITIVITY)    ____________________________________________  EKG: Normal sinus rhythm, ventricular rate 84.  QRS 109, QTc 434.  No ST elevations.  No depression. ________________________________________  RADIOLOGY All imaging, including plain films, CT scans, and ultrasounds, independently reviewed by me, and interpretations confirmed via formal radiology reads.  ED MD interpretation:   -CT Head: NAICA CXR: Vascular congestion  Official radiology report(s): CT Head Wo Contrast  Result Date: 01/27/2021 CLINICAL DATA:  Delirium.  Found lying face down on floor. EXAM: CT HEAD WITHOUT CONTRAST TECHNIQUE: Contiguous axial images were obtained from the base of the skull through the vertex without intravenous contrast. COMPARISON:  Head CT 01/12/2018 FINDINGS: Brain: Stable degree of atrophy and chronic small vessel ischemia. No intracranial hemorrhage, mass effect, or midline shift. No hydrocephalus. The basilar cisterns are patent. No evidence of territorial infarct or acute ischemia. No extra-axial or intracranial fluid collection. Vascular: Atherosclerosis of skullbase vasculature without hyperdense vessel or abnormal calcification. Skull: No fracture or focal lesion. Sinuses/Orbits: Occasional opacification of lower right mastoid air cells. Paranasal sinuses are unremarkable. Again seen proptosis. Other: None. IMPRESSION: 1. No acute intracranial abnormality. 2. Stable atrophy and chronic small vessel ischemia. Electronically Signed   By: Keith Rake M.D.   On: 01/27/2021 22:20   DG Chest Portable 1 View  Result Date: 01/27/2021 CLINICAL DATA:  Shortness of breath. Found lying face down on floor. EXAM: PORTABLE CHEST 1 VIEW COMPARISON:  06/18/2019 FINDINGS: Chronic  cardiomegaly. Vascular congestion also seen on prior exam. No convincing pulmonary edema. No focal airspace disease. No pleural fluid or pneumothorax. No acute osseous abnormalities are seen. Soft tissue attenuation from habitus limits assessment. IMPRESSION: Chronic cardiomegaly and vascular congestion. Electronically Signed   By: Keith Rake M.D.   On: 01/27/2021 21:26    ____________________________________________  PROCEDURES   Procedure(s) performed (including Critical Care):  Ultrasound ED Peripheral IV (Provider)  Date/Time: 01/28/2021 1:37 AM Performed by: Duffy Bruce, MD Authorized by: Duffy Bruce, MD   Procedure details:    Indications: multiple failed IV attempts     Skin Prep: chlorhexidine gluconate     Location:  Left anterior forearm   Angiocath:  20 G   Bedside Ultrasound Guided: Yes     Images: not archived     Patient tolerated procedure without complications: Yes     Dressing applied: Yes   .1-3 Lead EKG Interpretation Performed by: Duffy Bruce, MD Authorized by: Duffy Bruce, MD     Interpretation: normal     ECG rate:  80-90   ECG rate assessment: normal     Rhythm: sinus rhythm     Ectopy: none     Conduction: normal   Comments:  Indication: Weakness    ____________________________________________  INITIAL IMPRESSION / MDM / ASSESSMENT AND PLAN / ED COURSE  As part of my medical decision making, I reviewed the following data within the [place] notes reviewed and incorporated, Old chart reviewed, Notes from prior ED visits, and [place] Controlled Substance Database       *[name] was evaluated in Emergency Department on 01/28/2021 for the symptoms described in the history of present illness. He was evaluated in the context of the global COVID-19 pandemic, which necessitated consideration that the patient might be at risk for infection with the SARS-CoV-2 virus that causes COVID-19.  Institutional protocols and algorithms that pertain to the evaluation of patients at risk for COVID-19 are in a state of rapid change based on information released by regulatory bodies including the CDC and federal and state organizations. These policies and algorithms were followed during the patient's care in the ED.  Some ED evaluations and interventions may be delayed as a result of limited staffing during the pandemic.*     Medical Decision Making:  55 yo M here with generalized weakness, fall. On arrival, pt hypoglycemic, mildly confused but improved with D50. Labs show mild likely reactive leukocytosis, lytes overall acceptable despite missing dialysis today. CK elevated in 3000s likely due to fall and prolonged downtime. CXR shows vascular congestion, no fx or hemothorax. CT head is negative. Suspect hypoglycemia, weakness from prolonged downtime, with mild rhabdo. Admit to medicine. Dr. Juleen China of Nephrology consulted, aware of pt.  ____________________________________________  FINAL CLINICAL IMPRESSION(S) / ED DIAGNOSES  Final diagnoses:  Hypoglycemia  Traumatic rhabdomyolysis, initial encounter ([place])  Fall, initial encounter  ESRD (end stage renal disease) ([place])     MEDICATIONS GIVEN DURING THIS VISIT:  Medications  allopurinol (ZYLOPRIM) tablet 100 mg (has no administration in time range)  aspirin EC tablet 81 mg (has no administration in time range)  atorvastatin (LIPITOR) tablet 40 mg (40 mg Oral Given 01/28/21 0126)  carvedilol (COREG) tablet 25 mg (25 mg Oral Given 01/28/21 0126)  furosemide (LASIX) tablet 40 mg (has no administration in time range)  hydrALAZINE (APRESOLINE) tablet 25 mg (25 mg Oral Given 01/28/21 0126)  isosorbide mononitrate (IMDUR) 24 hr tablet 60 mg (has no administration in time range)  metolazone (ZAROXOLYN) tablet 5 mg (has no administration in time range)  polyethylene glycol powder (GLYCOLAX/MIRALAX) container 17 g (has no administration in time  range)  pantoprazole (PROTONIX) EC tablet 40 mg (has no administration in time range)  cyclobenzaprine (FLEXERIL) tablet 5 mg (has no administration in time range)  gabapentin (NEURONTIN) tablet 300 mg (300 mg Oral Given 01/28/21 0126)  cholecalciferol (VITAMIN D) tablet 2,000 Units (has no administration in time range)  magnesium oxide (MAG-OX) tablet 200 mg (has no administration in time range)  multivitamin (RENA-VIT) tablet 1 tablet (has no administration in time range)  albuterol (VENTOLIN HFA) 108 (90 Base) MCG/ACT inhaler 2 puff (has no administration in time range)  umeclidinium-vilanterol (ANORO ELLIPTA) 62.5-25 MCG/INH 1 puff (has no administration in time range)  calcipotriene (DOVONOX) 0.005 % cream (has no administration in time range)  latanoprost (XALATAN) 0.005 % ophthalmic solution 1 drop (1 drop Both Eyes Not Given 01/28/21 0127)  lidocaine-prilocaine (EMLA) cream 1 application (has no administration in time range)  heparin injection 5,000 Units (5,000 Units Subcutaneous Given 01/28/21 0125)  0.9 %  sodium chloride infusion ( Intravenous New Bag/Given 01/28/21 0127)  acetaminophen (TYLENOL) tablet 650 mg (has no administration in time  range)    Or  acetaminophen (TYLENOL) suppository 650 mg (has no administration in time range)  ondansetron (ZOFRAN) tablet 4 mg (has no administration in time range)    Or  ondansetron (ZOFRAN) injection 4 mg (has no administration in time range)  magnesium hydroxide (MILK OF MAGNESIA) suspension 30 mL (has no administration in time range)  morphine 2 MG/ML injection 2 mg (has no administration in time range)  nitroGLYCERIN (NITROSTAT) SL tablet 0.4 mg (has no administration in time range)  insulin aspart (novoLOG) injection 0-9 Units (has no administration in time range)  labetalol (NORMODYNE) injection 20 mg (has no administration in time range)  traZODone (DESYREL) tablet 150 mg (has no administration in time range)    Or  traZODone  (DESYREL) tablet 225 mg (has no administration in time range)  dextrose 50 % solution 50 mL (50 mLs Intravenous Given 01/27/21 2100)  HYDROmorphone (DILAUDID) injection 0.5 mg (0.5 mg Intravenous Given 01/27/21 2249)     ED Discharge Orders    None       Note:  This document was prepared using Dragon voice recognition software and may include unintentional dictation errors.   Duffy Bruce, MD 01/28/21 0140

## 2021-01-27 NOTE — H&P (Addendum)
[name]   PATIENT NAME: [name]    MR#:  [id number]  DATE OF BIRTH:  1966/05/05  DATE OF ADMISSION:  01/27/2021  PRIMARY CARE PHYSICIAN: [facility]   Patient is coming from: Home  REQUESTING/REFERRING PHYSICIAN: Duffy Bruce, MD  CHIEF COMPLAINT:   Chief Complaint  Patient presents with  . Hypoglycemia    HISTORY OF PRESENT ILLNESS:  [name] is a 55 y.o. African-American male with medical history significant for end-stage renal disease on hemodialysis on Mondays, Wednesdays and Fridays, hypertension, dyslipidemia, COPD, coronary artery disease and psoriasis, who presented to the emergency room with acute onset of altered mental status after having a fall 2 days ago and has been facedown for the last 48 hours. He was found today by a friend who has not seen him coming to his hemodialysis session. He was having midsternal chest pain as well as aching in both legs and knees.  He denies any nausea or vomiting or diarrhea or abdominal pain. No fever or chills.  No dysuria or hematuria or flank pain. ED Course: When he came to the ER, blood pressure was 198/88 with otherwise normal vital signs. Labs revealed BUN of 56 and creatinine 8.74 with potassium 4.7. VBG showed pH 7.26 and HCO3 of 26. His CK was 3336 and high-sensitivity troponin I was 70. CBC showed WBC of 11.2 with hemoglobin of 10 hematocrit 31.4 EKG as reviewed by me : Showed sinus rhythm with rate of 84 with borderline left axis deviation and slightly poor R wave progression. Imaging: Chest x-ray showed chronic cardiomegaly and vascular congestion.  The patient was given an amp of D50 0.5 mg of IV @ for pain. He will be admitted to an observation progressive unit bed for further evaluation and management PAST MEDICAL HISTORY:   Past Medical History:  Diagnosis Date  . Bell's palsy   . Bell's palsy   . Cataract   . CHF (congestive heart failure) ([place])   . Chronic kidney disease     dialysis  . COPD (chronic obstructive pulmonary disease) ([place])   . Coronary artery disease   . Diabetes mellitus without complication ([place])   . Dyspnea    Wheezing  . GERD (gastroesophageal reflux disease)   . Gout   . HOH (hard of hearing)    mild  . Hypercholesterolemia   . Hypertension   . NSVT (nonsustained ventricular tachycardia) ([place])   . Obstructive sleep apnea    CPAP, O2 use continuosly at 3LPM  . Orthopnea   . Oxygen dependent    3 lpm continuous  . Pneumonia    In Past X2 most recent 1 yr ago  . Psoriasis   . Psoriasis   . Pulmonary HTN ([place])   . Renal failure     PAST SURGICAL HISTORY:   Past Surgical History:  Procedure Laterality Date  . AV FISTULA PLACEMENT Right 11/20/2019   Procedure: ARTERIOVENOUS (AV) FISTULA CREATION ( BRACHIAL CEPHALIC );  Surgeon: Katha Cabal, MD;  Location: [facility];  Service: Vascular;  Laterality: Right;  . CATARACT EXTRACTION W/PHACO Left 12/17/2019   Procedure: CATARACT EXTRACTION PHACO AND INTRAOCULAR LENS PLACEMENT ([place]) LEFT ISTENT INJ DIABETIC;  Surgeon: Eulogio Bear, MD;  Location: [facility];  Service: Ophthalmology;  Laterality: Left;  [place] 00:33.2 CDE 2.96 Fluid Pack Lot # [id number]  . CATARACT EXTRACTION W/PHACO Right 02/24/2020   Procedure: CATARACT EXTRACTION PHACO AND INTRAOCULAR LENS PLACEMENT (IOC) RIGHT DIABETIC ISTENT INJ;  Surgeon: Eulogio Bear, MD;  Location: [facility];  Service: Ophthalmology;  Laterality: Right;  [place] 00:37.3 CDE 2.88 Fluid Pack Lot # [id number]  . CORONARY ANGIOPLASTY WITH STENT PLACEMENT     stent placement  . DIALYSIS/PERMA CATHETER INSERTION N/A 06/22/2019   Procedure: DIALYSIS/PERMA CATHETER INSERTION;  Surgeon: Algernon Huxley, MD;  Location: [place] [facility];  Service: Cardiovascular;  Laterality: N/A;  . DIALYSIS/PERMA CATHETER REMOVAL N/A 05/31/2020   Procedure: DIALYSIS/PERMA CATHETER REMOVAL;  Surgeon: Katha Cabal, MD;  Location: [place] [facility];  Service:  Cardiovascular;  Laterality: N/A;  . INSERTION OF AHMED VALVE Left 12/17/2019   Procedure: INSERTION OF iSTENT;  Surgeon: Eulogio Bear, MD;  Location: [facility];  Service: Ophthalmology;  Laterality: Left;    SOCIAL HISTORY:   Social History   Tobacco Use  . Smoking status: Never Smoker  . Smokeless tobacco: Never Used  Substance Use Topics  . Alcohol use: Not Currently    Alcohol/week: 1.0 standard drink    Types: 1 Cans of beer per week    Comment: occasional    FAMILY HISTORY:   Family History  Problem Relation Age of Onset  . Heart failure Mother   . Kidney failure Brother     DRUG ALLERGIES:   Allergies  Allergen Reactions  . Shellfish Allergy Anaphylaxis    Face and throat swelling, difficulty breathing Allergy can be triggered by touching (contact)  . Betadine [Povidone Iodine] Rash  . Povidone-Iodine Rash    REVIEW OF SYSTEMS:   ROS As per history of present illness. All pertinent systems were reviewed above. Constitutional, HEENT, cardiovascular, respiratory, GI, GU, musculoskeletal, neuro, psychiatric, endocrine, integumentary and hematologic systems were reviewed and are otherwise negative/unremarkable except for positive findings mentioned above in the HPI.   MEDICATIONS AT HOME:   Prior to Admission medications   Medication Sig Start Date End Date Taking? Authorizing Provider  albuterol (PROVENTIL HFA;VENTOLIN HFA) 108 (90 Base) MCG/ACT inhaler Inhale 2 puffs into the lungs every 4 (four) hours as needed for wheezing or shortness of breath. 01/20/19   Doles-Johnson, Teah, NP  allopurinol (ZYLOPRIM) 100 MG tablet Take by mouth. 09/23/20 09/23/21  [provider]  ANORO ELLIPTA 62.5-25 MCG/INH AEPB INHALE 1 PUFF INTO THE LUNGS DAILY. Patient taking differently: Inhale 1 puff into the lungs daily. 11/30/19   Kendell Bane, NP  atorvastatin (LIPITOR) 40 MG tablet Take 1 tablet (40 mg total) by mouth at bedtime. 03/21/20   Kendell Bane, NP   betamethasone dipropionate 0.05 % cream Apply topically as directed. Apply every AM for 2 weeks then decrease to every other day 10/04/20   Ralene Bathe, MD  calcipotriene (DOVONOX) 0.005 % cream Apply topically as directed. Apply every PM for two weeks then every other day alternating with the betamethasone 10/04/20   Ralene Bathe, MD  Calcipotriene-Betameth Diprop (ENSTILAR) 0.005-0.064 % FOAM Apply to skin qd-bid 09/22/20   Ralene Bathe, MD  carvedilol (COREG) 25 MG tablet Take 1 tablet (25 mg total) by mouth every 12 (twelve) hours. 03/21/20   Kendell Bane, NP  Cholecalciferol (D3-1000) 25 MCG (1000 UT) tablet Take 2 tablets (2,000 Units total) by mouth daily. 01/20/19   Doles-Johnson, Teah, NP  cyclobenzaprine (FLEXERIL) 5 MG tablet Take 1 tablet (5 mg total) by mouth 2 (two) times daily as needed for muscle spasms. 03/29/20   Kendell Bane, NP  Dulaglutide (TRULICITY) 1.5 SL/3.7DS SOPN Inject 1.5 sq every week 08/30/20  Lavera Guise, MD  DUREZOL 0.05 % [facility]  04/14/20   [provider]  furosemide (LASIX) 40 MG tablet Take 1 tablet (40 mg total) by mouth 2 (two) times daily. 08/30/20   Lavera Guise, MD  gabapentin (NEURONTIN) 600 MG tablet Take by mouth. 09/23/20   [provider]  GAVILAX 17 GM/SCOOP powder Take 17 g by mouth daily as needed for mild constipation or moderate constipation.  06/29/19   [provider]  glimepiride (AMARYL) 4 MG tablet Take 4 mg by mouth daily. 11/27/19   [provider]  GNP ASPIRIN LOW DOSE 81 MG EC tablet TAKE 1 TABLET BY MOUTH ONCE DAILY. 09/23/20   Kendell Bane, NP  hydrALAZINE (APRESOLINE) 25 MG tablet TAKE 1 TABLET BY MOUTH TWICE DAILY FOR HIGH BLOOD PRESSURE Patient taking differently: Take 25 mg by mouth in the morning and at bedtime. 11/30/19   Scarboro, Audie Clear, NP  insulin degludec (TRESIBA FLEXTOUCH) 100 UNIT/ML FlexTouch Pen Inject 20 Units into the skin at bedtime. 08/30/20   Lavera Guise, MD   isosorbide mononitrate (IMDUR) 60 MG 24 hr tablet TAKE 1 TABLET BY MOUTH ONCE DAILY. 08/30/20   Lavera Guise, MD  latanoprost Ivin Poot) 0.005 % ophthalmic solution  08/30/20   [provider]  lidocaine-prilocaine (EMLA) cream Apply 1 application topically daily as needed (port).  05/18/20   [provider]  Magnesium Oxide 200 MG TABS Take 1 tablet (200 mg total) by mouth daily. Patient taking differently: Take 100 mg by mouth daily. 01/12/20   Kendell Bane, NP  metolazone (ZAROXOLYN) 5 MG tablet Take 1 tablet (5 mg total) by mouth daily. 01/12/20   Kendell Bane, NP  Multiple Vitamins-Minerals (TAB-A-VITE) TABS TAKE 1 TABLET BY MOUTH ONCE DAILY. Patient taking differently: Take 1 tablet by mouth daily. 02/19/20   Kendell Bane, NP  multivitamin (RENA-VIT) TABS tablet Take 1 tablet by mouth at bedtime. Patient taking differently: Take 1 tablet by mouth daily. 06/25/19   Stark Jock Jude, MD  omeprazole (PRILOSEC) 20 MG capsule Take 1 capsule (20 mg total) by mouth 2 (two) times daily before a meal. 02/19/20   Scarboro, Audie Clear, NP  Risankizumab-rzaa (SKYRIZI) 150 MG/ML SOSY Inject 150 mg into the skin as directed. Every 12 weeks for maintenance. 11/09/20   Ralene Bathe, MD  traZODone (DESYREL) 100 MG tablet Take one or one and half tab at night for sleep Patient taking differently: Take 150 mg by mouth at bedtime as needed for sleep. Take one or one and half tab at night for sleep 02/04/20   Kendell Bane, NP      VITAL SIGNS:  Blood pressure (!) 167/86, pulse 84, temperature 98.6 F (37 C), temperature source Oral, resp. rate (!) 24, height 5\' 4"  (1.626 m), weight 121.1 kg, SpO2 100 %.  PHYSICAL EXAMINATION:  Physical Exam  GENERAL:  55 y.o.-year-old obese African-American male patient lying in the bed with no acute distress.  EYES: Pupils equal, round, reactive to light and accommodation. No scleral icterus. Extraocular muscles intact.  HEENT: Head atraumatic,  normocephalic. Oropharynx with dry mucous membrane and tongue and nasopharynx clear.  NECK:  Supple, no jugular venous distention. No thyroid enlargement, no tenderness.  LUNGS: Minimally diminished bibasal breath sounds with minimal rales with no wheezes or rhonchi.  No use of accessory muscles of respiration.  CARDIOVASCULAR: Regular rate and rhythm, S1, S2 normal. No murmurs, rubs, or gallops.  ABDOMEN: Soft, nondistended, nontender.  Bowel sounds present. No organomegaly or mass.  EXTREMITIES: No pedal edema, cyanosis, or clubbing.  NEUROLOGIC: Cranial nerves II through XII are intact. Muscle strength 5/5 in all extremities. Sensation intact. Gait not checked.  PSYCHIATRIC: The patient is alert and oriented x 3.  Normal affect and good eye contact. SKIN: No obvious rash, lesion, or ulcer.  Musculoskeletal: Tender on palpation of his anterior chest wall with mild erythema. LABORATORY PANEL:   CBC Recent Labs  Lab 01/27/21 2108  WBC 11.2*  HGB 10.0*  HCT 31.4*  PLT 216   ------------------------------------------------------------------------------------------------------------------  Chemistries  Recent Labs  Lab 01/27/21 2108  NA 143  K 4.7  CL 105  CO2 23  GLUCOSE 123*  BUN 56*  CREATININE 8.74*  CALCIUM 9.0  AST 67*  ALT 26  ALKPHOS 150*  BILITOT 1.1   ------------------------------------------------------------------------------------------------------------------  Cardiac Enzymes No results for input(s): TROPONINI in the last 168 hours. ------------------------------------------------------------------------------------------------------------------  RADIOLOGY:  CT Head Wo Contrast  Result Date: 01/27/2021 CLINICAL DATA:  Delirium.  Found lying face down on floor. EXAM: CT HEAD WITHOUT CONTRAST TECHNIQUE: Contiguous axial images were obtained from the base of the skull through the vertex without intravenous contrast. COMPARISON:  Head CT 01/12/2018 FINDINGS:  Brain: Stable degree of atrophy and chronic small vessel ischemia. No intracranial hemorrhage, mass effect, or midline shift. No hydrocephalus. The basilar cisterns are patent. No evidence of territorial infarct or acute ischemia. No extra-axial or intracranial fluid collection. Vascular: Atherosclerosis of skullbase vasculature without hyperdense vessel or abnormal calcification. Skull: No fracture or focal lesion. Sinuses/Orbits: Occasional opacification of lower right mastoid air cells. Paranasal sinuses are unremarkable. Again seen proptosis. Other: None. IMPRESSION: 1. No acute intracranial abnormality. 2. Stable atrophy and chronic small vessel ischemia. Electronically Signed   By: Keith Rake M.D.   On: 01/27/2021 22:20   DG Chest Portable 1 View  Result Date: 01/27/2021 CLINICAL DATA:  Shortness of breath. Found lying face down on floor. EXAM: PORTABLE CHEST 1 VIEW COMPARISON:  06/18/2019 FINDINGS: Chronic cardiomegaly. Vascular congestion also seen on prior exam. No convincing pulmonary edema. No focal airspace disease. No pleural fluid or pneumothorax. No acute osseous abnormalities are seen. Soft tissue attenuation from habitus limits assessment. IMPRESSION: Chronic cardiomegaly and vascular congestion. Electronically Signed   By: Keith Rake M.D.   On: 01/27/2021 21:26      IMPRESSION AND PLAN:  Active Problems:   Hypoglycemia  1. Hypoglycemia with type 2 diabetes mellitus with subsequent fall. -The patient will be admitted to an observation progressive unit bed. -We will hold off his Tyler Aas and Amaryl and placed on supplement coverage with NovoLog.  2. Acute rhabdomyolysis, secondary to his fall and being on the ground for 2 days. -We will gently hydrate with IV normal saline and follow CK levels.  3. Chest pain, likely atypical musculoskeletal due to his fall. -We will still follow troponin I's and place on as needed IV morphine sulfate for pain.  4. Hypertensive  urgency. -This is likely secondary to missing his antihypertensives for couple of days. -We will place him on his hydralazine, Imdur and add as needed IV labetalol.  5. End-stage renal disease on hemodialysis. -Dr. Juleen China was notified about the patient and will dialyze him tomorrow.  6. GERD. -We will continue PPI therapy.  7. Coronary artery disease. -We will continue his Imdur and statin therapy.  8. Gout. -We will continue his Amaryl.  9. Dyslipidemia. -We will continue statin therapy.  10. COPD without exacerbation. -We  will continue his inhalers.  DVT prophylaxis: Subcutaneous heparin. Code Status: full code. Family Communication:  The plan of care was discussed in details with the patient who requested no family communication at this time. I answered all questions. The patient agreed to proceed with the above mentioned plan. Further management will depend upon hospital course. Disposition Plan: Back to previous home environment Consults called: Nephrology. All the records are reviewed and case discussed with ED provider.  Status is: Observation  The patient remains OBS appropriate and will d/c before 2 midnights.  Dispo: The patient is from: Home              Anticipated d/c is to: Home              Anticipated d/c date is: 1 day              Patient currently is not medically stable to d/c.   Difficult to place patient No   TOTAL TIME TAKING CARE OF THIS PATIENT: 55 minutes.    Christel Mormon M.D on 01/27/2021 at 11:27 PM  [facility]   From 7 PM-7 AM, contact night-coverage [email or web address]  CC: Primary care physician; [facility]

## 2021-01-28 DIAGNOSIS — I2510 Atherosclerotic heart disease of native coronary artery without angina pectoris: Secondary | ICD-10-CM | POA: Diagnosis present

## 2021-01-28 DIAGNOSIS — I5042 Chronic combined systolic (congestive) and diastolic (congestive) heart failure: Secondary | ICD-10-CM | POA: Diagnosis not present

## 2021-01-28 DIAGNOSIS — E11649 Type 2 diabetes mellitus with hypoglycemia without coma: Secondary | ICD-10-CM | POA: Diagnosis present

## 2021-01-28 DIAGNOSIS — Z91013 Allergy to seafood: Secondary | ICD-10-CM | POA: Diagnosis not present

## 2021-01-28 DIAGNOSIS — R531 Weakness: Secondary | ICD-10-CM | POA: Diagnosis present

## 2021-01-28 DIAGNOSIS — M1A9XX0 Chronic gout, unspecified, without tophus (tophi): Secondary | ICD-10-CM | POA: Diagnosis present

## 2021-01-28 DIAGNOSIS — I160 Hypertensive urgency: Secondary | ICD-10-CM | POA: Diagnosis present

## 2021-01-28 DIAGNOSIS — E662 Morbid (severe) obesity with alveolar hypoventilation: Secondary | ICD-10-CM | POA: Diagnosis present

## 2021-01-28 DIAGNOSIS — E162 Hypoglycemia, unspecified: Secondary | ICD-10-CM | POA: Diagnosis present

## 2021-01-28 DIAGNOSIS — J449 Chronic obstructive pulmonary disease, unspecified: Secondary | ICD-10-CM | POA: Diagnosis present

## 2021-01-28 DIAGNOSIS — Z9109 Other allergy status, other than to drugs and biological substances: Secondary | ICD-10-CM | POA: Diagnosis not present

## 2021-01-28 DIAGNOSIS — I429 Cardiomyopathy, unspecified: Secondary | ICD-10-CM | POA: Diagnosis present

## 2021-01-28 DIAGNOSIS — Y92009 Unspecified place in unspecified non-institutional (private) residence as the place of occurrence of the external cause: Secondary | ICD-10-CM | POA: Diagnosis not present

## 2021-01-28 DIAGNOSIS — Z6841 Body Mass Index (BMI) 40.0 and over, adult: Secondary | ICD-10-CM | POA: Diagnosis not present

## 2021-01-28 DIAGNOSIS — I5043 Acute on chronic combined systolic (congestive) and diastolic (congestive) heart failure: Secondary | ICD-10-CM | POA: Diagnosis present

## 2021-01-28 DIAGNOSIS — J9621 Acute and chronic respiratory failure with hypoxia: Secondary | ICD-10-CM | POA: Diagnosis present

## 2021-01-28 DIAGNOSIS — E8770 Fluid overload, unspecified: Secondary | ICD-10-CM | POA: Diagnosis present

## 2021-01-28 DIAGNOSIS — L409 Psoriasis, unspecified: Secondary | ICD-10-CM | POA: Diagnosis present

## 2021-01-28 DIAGNOSIS — I953 Hypotension of hemodialysis: Secondary | ICD-10-CM | POA: Diagnosis present

## 2021-01-28 DIAGNOSIS — Z992 Dependence on renal dialysis: Secondary | ICD-10-CM | POA: Diagnosis not present

## 2021-01-28 DIAGNOSIS — N186 End stage renal disease: Secondary | ICD-10-CM | POA: Diagnosis present

## 2021-01-28 DIAGNOSIS — K219 Gastro-esophageal reflux disease without esophagitis: Secondary | ICD-10-CM | POA: Diagnosis present

## 2021-01-28 DIAGNOSIS — E1142 Type 2 diabetes mellitus with diabetic polyneuropathy: Secondary | ICD-10-CM | POA: Diagnosis present

## 2021-01-28 DIAGNOSIS — I248 Other forms of acute ischemic heart disease: Secondary | ICD-10-CM | POA: Diagnosis not present

## 2021-01-28 DIAGNOSIS — R079 Chest pain, unspecified: Secondary | ICD-10-CM | POA: Diagnosis not present

## 2021-01-28 DIAGNOSIS — I132 Hypertensive heart and chronic kidney disease with heart failure and with stage 5 chronic kidney disease, or end stage renal disease: Secondary | ICD-10-CM | POA: Diagnosis present

## 2021-01-28 DIAGNOSIS — Z20822 Contact with and (suspected) exposure to covid-19: Secondary | ICD-10-CM | POA: Diagnosis present

## 2021-01-28 DIAGNOSIS — E785 Hyperlipidemia, unspecified: Secondary | ICD-10-CM | POA: Diagnosis present

## 2021-01-28 DIAGNOSIS — I5032 Chronic diastolic (congestive) heart failure: Secondary | ICD-10-CM | POA: Diagnosis not present

## 2021-01-28 DIAGNOSIS — W19XXXA Unspecified fall, initial encounter: Secondary | ICD-10-CM | POA: Diagnosis present

## 2021-01-28 DIAGNOSIS — E1122 Type 2 diabetes mellitus with diabetic chronic kidney disease: Secondary | ICD-10-CM | POA: Diagnosis present

## 2021-01-28 LAB — BASIC METABOLIC PANEL
Anion gap: 12 (ref 5–15)
BUN: 62 mg/dL — ABNORMAL HIGH (ref 6–20)
CO2: 27 mmol/L (ref 22–32)
Calcium: 8.7 mg/dL — ABNORMAL LOW (ref 8.9–10.3)
Chloride: 105 mmol/L (ref 98–111)
Creatinine, Ser: 9.14 mg/dL — ABNORMAL HIGH (ref 0.61–1.24)
GFR, Estimated: 6 mL/min — ABNORMAL LOW (ref >60)
Glucose, Bld: 92 mg/dL (ref 70–99)
Potassium: 4.8 mmol/L (ref 3.5–5.1)
Sodium: 144 mmol/L (ref 135–145)

## 2021-01-28 LAB — BLOOD GAS, ARTERIAL
Acid-Base Excess: 2.2 mmol/L — ABNORMAL HIGH (ref 0.0–2.0)
Bicarbonate: 28.6 mmol/L — ABNORMAL HIGH (ref 20.0–28.0)
FIO2: 1
O2 Saturation: 95.3 %
Patient temperature: 37
pCO2 arterial: 53 mmHg — ABNORMAL HIGH (ref 32.0–48.0)
pH, Arterial: 7.34 — ABNORMAL LOW (ref 7.350–7.450)
pO2, Arterial: 82 mmHg — ABNORMAL LOW (ref 83.0–108.0)

## 2021-01-28 LAB — CKMB (ARMC ONLY): CK, MB: 4 ng/mL (ref 0.5–5.0)

## 2021-01-28 LAB — CBC
HCT: 31.7 % — ABNORMAL LOW (ref 39.0–52.0)
Hemoglobin: 9.6 g/dL — ABNORMAL LOW (ref 13.0–17.0)
MCH: 28.7 pg (ref 26.0–34.0)
MCHC: 30.3 g/dL (ref 30.0–36.0)
MCV: 94.9 fL (ref 80.0–100.0)
Platelets: 205 10*3/uL (ref 150–400)
RBC: 3.34 MIL/uL — ABNORMAL LOW (ref 4.22–5.81)
RDW: 15.5 % (ref 11.5–15.5)
WBC: 12.8 10*3/uL — ABNORMAL HIGH (ref 4.0–10.5)
nRBC: 0 % (ref 0.0–0.2)

## 2021-01-28 LAB — CBG MONITORING, ED
Glucose-Capillary: 52 mg/dL — ABNORMAL LOW (ref 70–99)
Glucose-Capillary: 64 mg/dL — ABNORMAL LOW (ref 70–99)
Glucose-Capillary: 125 mg/dL — ABNORMAL HIGH (ref 70–99)
Glucose-Capillary: 84 mg/dL (ref 70–99)
Glucose-Capillary: 72 mg/dL (ref 70–99)
Glucose-Capillary: 63 mg/dL — ABNORMAL LOW (ref 70–99)
Glucose-Capillary: 66 mg/dL — ABNORMAL LOW (ref 70–99)
Glucose-Capillary: 140 mg/dL — ABNORMAL HIGH (ref 70–99)
Glucose-Capillary: 195 mg/dL — ABNORMAL HIGH (ref 70–99)

## 2021-01-28 LAB — GLUCOSE, CAPILLARY
Glucose-Capillary: 163 mg/dL — ABNORMAL HIGH (ref 70–99)
Glucose-Capillary: 130 mg/dL — ABNORMAL HIGH (ref 70–99)
Glucose-Capillary: 249 mg/dL — ABNORMAL HIGH (ref 70–99)

## 2021-01-28 LAB — TROPONIN I (HIGH SENSITIVITY)
Troponin I (High Sensitivity): 61 ng/L — ABNORMAL HIGH (ref <18)
Troponin I (High Sensitivity): 68 ng/L — ABNORMAL HIGH (ref <18)
Troponin I (High Sensitivity): 86 ng/L — ABNORMAL HIGH (ref <18)
Troponin I (High Sensitivity): 70 ng/L — ABNORMAL HIGH (ref <18)
Troponin I (High Sensitivity): 66 ng/L — ABNORMAL HIGH (ref <18)

## 2021-01-28 LAB — LACTIC ACID, PLASMA
Lactic Acid, Venous: 1.4 mmol/L (ref 0.5–1.9)
Lactic Acid, Venous: 1.1 mmol/L (ref 0.5–1.9)

## 2021-01-28 LAB — CK
Total CK: 3471 U/L — ABNORMAL HIGH (ref 49–397)
Total CK: 3382 U/L — ABNORMAL HIGH (ref 49–397)
Total CK: 3317 U/L — ABNORMAL HIGH (ref 49–397)

## 2021-01-28 LAB — HEMOGLOBIN A1C
Hgb A1c MFr Bld: 5.6 % (ref 4.8–5.6)
Mean Plasma Glucose: 114.02 mg/dL

## 2021-01-28 LAB — MRSA PCR SCREENING: MRSA by PCR: NEGATIVE

## 2021-01-28 MED ORDER — ORAL CARE MOUTH RINSE
15.0000 mL | Freq: Two times a day (BID) | OROMUCOSAL | Status: DC
  Administered 2021-01-28 – 2021-01-29 (×3): 15 mL via OROMUCOSAL

## 2021-01-28 MED ORDER — INSULIN ASPART 100 UNIT/ML ~~LOC~~ SOLN
0.0000 [IU] | Freq: Three times a day (TID) | SUBCUTANEOUS | Status: DC
  Administered 2021-01-28: 1 [IU] via SUBCUTANEOUS
  Administered 2021-01-28: 3 [IU] via SUBCUTANEOUS
  Administered 2021-01-29 (×2): 2 [IU] via SUBCUTANEOUS
  Administered 2021-01-29: 1 [IU] via SUBCUTANEOUS
  Administered 2021-01-30: 2 [IU] via SUBCUTANEOUS
  Filled 2021-01-28 (×4): qty 1

## 2021-01-28 MED ORDER — CHLORHEXIDINE GLUCONATE CLOTH 2 % EX PADS
6.0000 | MEDICATED_PAD | Freq: Every day | CUTANEOUS | Status: DC
  Administered 2021-01-28 – 2021-01-31 (×4): 6 via TOPICAL

## 2021-01-28 MED ORDER — TRAZODONE HCL 50 MG PO TABS
225.0000 mg | ORAL_TABLET | Freq: Every evening | ORAL | Status: DC | PRN
  Filled 2021-01-28: qty 5

## 2021-01-28 MED ORDER — DEXTROSE 50 % IV SOLN
25.0000 g | Freq: Once | INTRAVENOUS | Status: AC
  Administered 2021-01-28: 25 g via INTRAVENOUS

## 2021-01-28 MED ORDER — DEXTROSE 5 % IV SOLN: INTRAVENOUS | Status: DC

## 2021-01-28 MED ORDER — DEXTROSE 50 % IV SOLN
INTRAVENOUS | Status: AC
  Filled 2021-01-28: qty 50

## 2021-01-28 MED ORDER — TRAZODONE HCL 50 MG PO TABS: 150.0000 mg | ORAL_TABLET | Freq: Every evening | ORAL | Status: DC | PRN

## 2021-01-28 MED ORDER — LABETALOL HCL 5 MG/ML IV SOLN: 20.0000 mg | INTRAVENOUS | Status: DC | PRN

## 2021-01-28 NOTE — Hospital Course (Signed)
55 year old male with past medical history of ESRD on dialysis MWF, hypertension, hyperlipidemia, COPD, OSA on trilogy at night, CAD, psoriasis presented to the ED on the evening of 01/27/2021 after being found down in his home for about 48 hours. He was found by a friend after patient did not show up for dialysis. On admission, patient reporting some midsternal chest discomfort, and aching pain in both legs and knees.  In the ED, BP uncontrolled 198/88, otherwise normal vitals. Labs remarkable for hypoglycemia initial blood glucose was 33, BUN 56, creatinine 8.74, potassium 4.7. VBG with pH 7.26 and bicarb 26. CK was 3336 and hs-troponin 70. EKG with normal sinus rhythm at a rate of 84 bpm without acute ischemic changes. Chest x-ray showed chronic cardiomegaly and vascular congestion.

## 2021-01-28 NOTE — ED Notes (Signed)
Pt given 4oz sprite d/t hypoglycemia per standing orders.

## 2021-01-28 NOTE — Progress Notes (Signed)
Pt arrived on the unit from HD with rapid response. Alert, oriented x4, denies pain. NRB was switched to [place] 2L, sats are 96%. Skin  discoloration all over the body, mainly legs and arms/HX of psoriasis. MD at the bedside, see new orders.  Bed in low position, alarms are on, call bell in reach, phone and keys are at the bedside, continue to [employer]

## 2021-01-28 NOTE — ED Notes (Signed)
Pt sitting up eating breakfast. Provided OJ with food d/t CBG 66.

## 2021-01-28 NOTE — Progress Notes (Signed)
Patient complained " I don't feel so well and I also feel nauseated". Blood pressure noted dropping. O2 sats dropping under 88%Patient noted to be very drowsy. Kristi the other dialysis nurse called to the room. UF turned off dialysis machine. A bolus 200 ml was given by Steffanie Dunn, RN. Dr. Juleen China made aware and Patient on 2 L/min via bubble [place]. Dr. Juleen China ordered nonrebreather @ 15 L/m. Respiratory was notified and  Patient continues to desat and BP continues to drop into 25'R systolic. At about 1400 Rapid response was called and emergency rinseback initiated. Patient never lost consciousness.   At or about 1410 Chest pain developed and a 12 lead was ordered and obtain. Labs drawn.  ICU took over Pt care at or about 1430.  Pt was alert and oriented x4 entire event

## 2021-01-28 NOTE — ED Notes (Signed)
Pt provided 4-6oz of OJ. Has a few more oz at bedside.

## 2021-01-28 NOTE — Significant Event (Signed)
Rapid Response Event Note   Reason for Call : called to dialysis for RR on patient with c/o chest pain, and hypotention.   Initial Focused Assessment: Laying on stretcher, 100% NRB in place, dialysis RN stopping session, see flowsheets VS.      Interventions: Dr's Arbutus Ped and Kolluru at bedside, EKG, and labs sent, will stat transfer to stepdown for further assessment and care.   Plan of Care: See Interventions    Event Summary:   MD Notified: 1400 Call [place] A, RN

## 2021-01-28 NOTE — ED Notes (Signed)
Pt taken to dialysis via transporter Hoyle Sauer.

## 2021-01-28 NOTE — Progress Notes (Signed)
PROGRESS NOTE    [name]   LKG:[id number]  DOB: [birth year]  PCP: [facility]    DOA: 01/27/2021 LOS: 0   Brief Narrative   55 year old male with past medical history of ESRD on dialysis MWF, hypertension, hyperlipidemia, COPD, OSA on trilogy at night, CAD, psoriasis presented to the ED on the evening of 01/27/2021 after being found down in his home for about 48 hours. He was found by a friend after patient did not show up for dialysis. On admission, patient reporting some midsternal chest discomfort, and aching pain in both legs and knees.  In the ED, BP uncontrolled 198/88, otherwise normal vitals. Labs remarkable for hypoglycemia initial blood glucose was 33, BUN 56, creatinine 8.74, potassium 4.7. VBG with pH 7.26 and bicarb 26. CK was 3336 and hs-troponin 70. EKG with normal sinus rhythm at a rate of 84 bpm without acute ischemic changes. Chest x-ray showed chronic cardiomegaly and vascular congestion.    Significant Events: --admitted 01/27/21 evening --rapid response for acute onset hypotension & hypoxia in dialysis --> stepdown   Assessment & Plan   Active Problems:   Hypoglycemia   Hypoglycemia -in the setting of being down for 48 hours, no p.o. intake. Treated with D50 twice overnight. Glucose still low this morning was briefly placed on D5. Stopped given volume overload.  Type 2 diabetes with renal complications - stop glimepiride (would stop it at d/c, sulfonylureas in dialysis patients increase risk of hypoglycemia).  Takes Tresiba 20 units bedtime.  Hold off basal insulin until sugars stable.  Sliding scale Novolog for now.  Fall of unknown known cause -patient down in his home for 48 hours and found by a friend after not showing up for dialysis. Cause unknown. Patient reports some chest discomfort but has no ischemic changes on EKG and only mild flat troponin elevation. --PT evaluation when clinically appropriate  Rhabdomyolysis -CK over 3000 but in  ESRD is not considered significant elevation. He was treated with normal saline overnight. Repeat CK levels pending.  Acute respiratory failure with hypoxia -sudden onset during dialysis today 2/12. Appears related to volume overload. Supplemental oxygen to maintain O2 sat 88 to 93% and wean as tolerated.  Hypotension -sudden onset during dialysis today 2/12. Occurred after 1 L had been taken off. Patient was given 500 cc bolus and blood pressure rebounded and is again elevated. Transferred to stepdown for close monitoring.  ESRD -on dialysis MWF schedule. Dialysis session today aborted early due to rapid response. Dialysis to be completed later in stepdown unit once patient stable. Nephrology is following.  GERD -continue PPI  Coronary artery disease -continue aspirin, statin and Imdur.   HFrEF - Echo 09/2019 EF mildly reced at 45-50%, mod LVH.    Gout -does not appear acutely flared but monitor closely. Continue allopurinol  Hyperlipidemia -continue statin  COPD -without acute exacerbation on admission. Continue home inhaler regimen.  OSA -uses Trilogy NIV at home.  BiPAP for overnight ordered.   Morbid obesity: Body mass index is 45.83 kg/m. Complicates overall care and prognosis. Recommend lifestyle modifications including physical activity and diet aimed at weight loss. Primary care follow-up.  DVT prophylaxis: heparin injection 5,000 Units Start: 01/27/21 2330   Diet:  Diet Orders (From admission, onward)    Start     Ordered   01/28/21 0010  Diet Carb Modified Fluid consistency: Thin; Room service appropriate? Yes  Diet effective now       Question Answer Comment  Diet-HS Snack? Nothing  Calorie Level Medium 1600-2000   Fluid consistency: Thin   Room service appropriate? Yes      01/28/21 0010            Code Status: Full Code    Subjective 01/28/21    Pt seen during rapid response in dialysis today, and afterward once transferred to stepdown.  Pt complaints  of some central chest discomfort, bilateral knee pain.  Denies SOB but is on oxygen.    Rapid called for acute hypoxia and hypotension in dialysis after 1 L taken off.  Given 500 cc bolus and BP recovered.  Transferred to stepdown for closer monitoring.   Disposition Plan & Communication   Status is: Inpatient  Remains inpatient appropriate because:Inpatient level of care appropriate due to severity of illness  As outlined above.  Dispo: The patient is from: Home              Anticipated d/c is to: Home              Anticipated d/c date is: 3 days              Patient currently is not medically stable to d/c.   Difficult to place patient No  Family Communication: none at bedside, will attempt to call    Consults, Procedures, Significant Events   Consultants:   Nephrology  Procedures:   Dialysis  Antimicrobials:  Anti-infectives (From admission, onward)   None        Objective   Vitals:   01/28/21 1300 01/28/21 1315 01/28/21 1330 01/28/21 1442  BP: 136/85 129/80 97/61 (!) 153/73  Pulse: 84 84 76   Resp: 13 14 16    Temp:    98.7 F (37.1 C)  TempSrc:    Oral  SpO2: 93% 93%    Weight:      Height:        Intake/Output Summary (Last 24 hours) at 01/28/2021 1450 Last data filed at 01/28/2021 1114 Gross per 24 hour  Intake 600 ml  Output -  Net 600 ml   Filed Weights   01/27/21 2043  Weight: 121.1 kg    Physical Exam:  General exam: awake, alert, no acute distress, morbidly obese, in dialysis HEENT: moist mucus membranes, hearing grossly normal  Respiratory system: diminished, faint crackles, no wheezes, on Ventimask. Cardiovascular system: normal S1/S2, RRR, diffuse pitting edema.   Gastrointestinal system: distended, non-tender Central nervous system: A&O x3. no gross focal neurologic deficits, normal speech Extremities: moves all, no edema, normal tone Skin: diffuse scattered areas of hyperpigmentation due to psoriasis, dry, intact, normal  temp Psychiatry: normal mood, congruent affect, judgement and insight appear normal  Labs   Data Reviewed: I have personally reviewed following labs and imaging studies  CBC: Recent Labs  Lab 01/27/21 2108 01/28/21 0500  WBC 11.2* 12.8*  NEUTROABS 8.7*  --   HGB 10.0* 9.6*  HCT 31.4* 31.7*  MCV 93.7 94.9  PLT 216 710   Basic Metabolic Panel: Recent Labs  Lab 01/27/21 2108 01/28/21 0500  NA 143 144  K 4.7 4.8  CL 105 105  CO2 23 27  GLUCOSE 123* 92  BUN 56* 62*  CREATININE 8.74* 9.14*  CALCIUM 9.0 8.7*   GFR: Estimated Creatinine Clearance: 11.2 mL/min (A) (by C-G formula based on SCr of 9.14 mg/dL (H)). Liver Function Tests: Recent Labs  Lab 01/27/21 2108  AST 67*  ALT 26  ALKPHOS 150*  BILITOT 1.1  PROT 8.5*  ALBUMIN 3.9  No results for input(s): LIPASE, AMYLASE in the last 168 hours. No results for input(s): AMMONIA in the last 168 hours. Coagulation Profile: No results for input(s): INR, PROTIME in the last 168 hours. Cardiac Enzymes: Recent Labs  Lab 01/27/21 2108 01/28/21 0124 01/28/21 0526 01/28/21 1402  CKTOTAL 3,336* 3,471* 3,382* 3,317*   BNP (last 3 results) No results for input(s): PROBNP in the last 8760 hours. HbA1C: Recent Labs    01/28/21 0124  HGBA1C 5.6   CBG: Recent Labs  Lab 01/28/21 0739 01/28/21 0840 01/28/21 1009 01/28/21 1148 01/28/21 1432  GLUCAP 63* 66* 140* 195* 163*   Lipid Profile: No results for input(s): CHOL, HDL, LDLCALC, TRIG, CHOLHDL, LDLDIRECT in the last 72 hours. Thyroid Function Tests: No results for input(s): TSH, T4TOTAL, FREET4, T3FREE, THYROIDAB in the last 72 hours. Anemia Panel: No results for input(s): VITAMINB12, FOLATE, FERRITIN, TIBC, IRON, RETICCTPCT in the last 72 hours. Sepsis Labs: Recent Labs  Lab 01/27/21 2108  LATICACIDVEN 1.3    Recent Results (from the past 240 hour(s))  Resp Panel by RT-PCR (Flu A&B, Covid) Nasopharyngeal Swab     Status: None   Collection Time:  01/27/21  9:06 PM   Specimen: Nasopharyngeal Swab; Nasopharyngeal(NP) swabs in vial transport medium  Result Value Ref Range Status   SARS Coronavirus 2 by RT PCR NEGATIVE NEGATIVE Final    Comment: (NOTE) SARS-CoV-2 target nucleic acids are NOT DETECTED.  The SARS-CoV-2 RNA is generally detectable in upper respiratory specimens during the acute phase of infection. The lowest concentration of SARS-CoV-2 viral copies this assay can detect is 138 copies/mL. A negative result does not preclude SARS-Cov-2 infection and should not be used as the sole basis for treatment or other patient management decisions. A negative result may occur with  improper specimen collection/handling, submission of specimen other than nasopharyngeal swab, presence of viral mutation(s) within the areas targeted by this assay, and inadequate number of viral copies(<138 copies/mL). A negative result must be combined with clinical observations, patient history, and epidemiological information. The expected result is Negative.  Fact Sheet for Patients:  EntrepreneurPulse.com.au  Fact Sheet for Healthcare Providers:  IncredibleEmployment.be  This test is no t yet approved or cleared by the [place] FDA and  has been authorized for detection and/or diagnosis of SARS-CoV-2 by FDA under an Emergency Use Authorization (EUA). This EUA will remain  in effect (meaning this test can be used) for the duration of the COVID-19 declaration under Section 564(b)(1) of the Act, 21 U.S.C.section 360bbb-3(b)(1), unless the authorization is terminated  or revoked sooner.       Influenza A by PCR NEGATIVE NEGATIVE Final   Influenza B by PCR NEGATIVE NEGATIVE Final    Comment: (NOTE) The Xpert Xpress SARS-CoV-2/FLU/RSV plus assay is intended as an aid in the diagnosis of influenza from Nasopharyngeal swab specimens and should not be used as a sole basis for treatment. Nasal washings  and aspirates are unacceptable for Xpert Xpress SARS-CoV-2/FLU/RSV testing.  Fact Sheet for Patients: EntrepreneurPulse.com.au  Fact Sheet for Healthcare Providers: IncredibleEmployment.be  This test is not yet approved or cleared by the [place] FDA and has been authorized for detection and/or diagnosis of SARS-CoV-2 by FDA under an Emergency Use Authorization (EUA). This EUA will remain in effect (meaning this test can be used) for the duration of the COVID-19 declaration under Section 564(b)(1) of the Act, 21 U.S.C. section 360bbb-3(b)(1), unless the authorization is terminated or revoked.  Performed at [facility], [address] [place], [place] [address]       Imaging Studies   CT Head Wo Contrast  Result Date: 01/27/2021 CLINICAL DATA:  Delirium.  Found lying face down on floor. EXAM: CT HEAD WITHOUT CONTRAST TECHNIQUE: Contiguous axial images were obtained from the base of the skull through the vertex without intravenous contrast. COMPARISON:  Head CT 01/12/2018 FINDINGS: Brain: Stable degree of atrophy and chronic small vessel ischemia. No intracranial hemorrhage, mass effect, or midline shift. No hydrocephalus. The basilar cisterns are patent. No evidence of territorial infarct or acute ischemia. No extra-axial or intracranial fluid collection. Vascular: Atherosclerosis of skullbase vasculature without hyperdense vessel or abnormal calcification. Skull: No fracture or focal lesion. Sinuses/Orbits: Occasional opacification of lower right mastoid air cells. Paranasal sinuses are unremarkable. Again seen proptosis. Other: None. IMPRESSION: 1. No acute intracranial abnormality. 2. Stable atrophy and chronic small vessel ischemia. Electronically Signed   By: Keith Rake M.D.   On: 01/27/2021 22:20   DG Chest Portable 1 View  Result Date: 01/27/2021 CLINICAL DATA:  Shortness of breath. Found lying face down on floor. EXAM:  PORTABLE CHEST 1 VIEW COMPARISON:  06/18/2019 FINDINGS: Chronic cardiomegaly. Vascular congestion also seen on prior exam. No convincing pulmonary edema. No focal airspace disease. No pleural fluid or pneumothorax. No acute osseous abnormalities are seen. Soft tissue attenuation from habitus limits assessment. IMPRESSION: Chronic cardiomegaly and vascular congestion. Electronically Signed   By: Keith Rake M.D.   On: 01/27/2021 21:26     Medications   Scheduled Meds: . allopurinol  100 mg Oral Daily  . aspirin EC  81 mg Oral Daily  . calcipotriene   Topical UD  . [START ON 01/29/2021] Chlorhexidine Gluconate Cloth  6 each Topical Q0600  . cholecalciferol  2,000 Units Oral Daily  . gabapentin  300 mg Oral QHS  . heparin  5,000 Units Subcutaneous Q8H  . insulin aspart  0-9 Units Subcutaneous TID PC & HS  . latanoprost  1 drop Both Eyes QHS  . magnesium oxide  200 mg Oral Daily  . multivitamin  1 tablet Oral Daily  . pantoprazole  40 mg Oral Daily  . umeclidinium-vilanterol  1 puff Inhalation Daily   Continuous Infusions:     LOS: 0 days    Time spent: 45 minutes with > 50% spent at bedside and in coordination of care.    Ezekiel Slocumb, DO [facility]  01/28/2021, 2:50 PM      If 7PM-7AM, please contact night-coverage. How to contact the [facility] Attending or Consulting provider [place] or covering provider during after hours [place], for this patient?    1. Check the care team in [facility] and look for a) attending/consulting [facility] provider listed and b) the [facility] team listed 2. Log into [email or web address] and use [place]'s universal password to access. If you do not have the password, please contact the hospital operator. 3. Locate the [facility] provider you are looking for under [facility] and page to a number that you can be directly reached. 4. If you still have difficulty reaching the provider, please page the [facility] (Director on Call) for the Hospitalists listed on amion  for assistance.

## 2021-01-28 NOTE — ED Notes (Signed)
BGL 52 at recheck.

## 2021-01-28 NOTE — Progress Notes (Signed)
[facility]  ROUNDING NOTE   Subjective:   [name] was admitted to [facility] on 01/27/2021 for Hypoglycemia [E16.2] ESRD (end stage renal disease) ([place]) [N18.6] Fall, initial encounter [W19.XXXA] Traumatic rhabdomyolysis, initial encounter ([place]) [T79.6XXA]  Last hemodialysis treatment was 2/9  Patient was found down for 48 hours missed dialysis. CK of 3000  Placed on dialysis this morning. Events documented below. Did not tolerate his treatment.    Objective:  Vital signs in last 24 hours:  Temp:  [98.6 F (37 C)] 98.6 F (37 C) (02/11 2243) Pulse Rate:  [75-96] 76 (02/12 1330) Resp:  [10-26] 16 (02/12 1330) BP: (97-198)/(60-105) 97/61 (02/12 1330) SpO2:  [92 %-100 %] 93 % (02/12 1315) Weight:  [121.1 kg] 121.1 kg (02/11 2043)  Weight change:  Filed Weights   01/27/21 2043  Weight: 121.1 kg    Intake/Output: No intake/output data recorded.   Intake/Output this shift:  Total I/O In: 600 [I.V.:600] Out: -   Physical Exam: General: Lethargic, laying in stretcher  Head: Normocephalic, atraumatic. Moist oral mucosal membranes  Eyes: Anicteric, PERRL  Neck: Supple, trachea midline  Lungs:  Diminished bilateral  Heart: Regular rate and rhythm  Abdomen:  Soft, nontender,   Extremities:  + peripheral edema.  Neurologic: Nonfocal, moving all four extremities  Skin: No lesions  Access: Left AVF    Basic Metabolic Panel: Recent Labs  Lab 01/27/21 2108 01/28/21 0500  NA 143 144  K 4.7 4.8  CL 105 105  CO2 23 27  GLUCOSE 123* 92  BUN 56* 62*  CREATININE 8.74* 9.14*  CALCIUM 9.0 8.7*    Liver Function Tests: Recent Labs  Lab 01/27/21 2108  AST 67*  ALT 26  ALKPHOS 150*  BILITOT 1.1  PROT 8.5*  ALBUMIN 3.9   No results for input(s): LIPASE, AMYLASE in the last 168 hours. No results for input(s): AMMONIA in the last 168 hours.  CBC: Recent Labs  Lab 01/27/21 2108 01/28/21 0500  WBC 11.2* 12.8*  NEUTROABS 8.7*  --   HGB  10.0* 9.6*  HCT 31.4* 31.7*  MCV 93.7 94.9  PLT 216 205    Cardiac Enzymes: Recent Labs  Lab 01/27/21 2108 01/28/21 0124 01/28/21 0526  CKTOTAL 3,336* 3,471* 3,382*    BNP: Invalid input(s): POCBNP  CBG: Recent Labs  Lab 01/28/21 0631 01/28/21 0739 01/28/21 0840 01/28/21 1009 01/28/21 1148  GLUCAP 72 63* 66* 140* 195*    Microbiology: Results for orders placed or performed during the hospital encounter of 01/27/21  Resp Panel by RT-PCR (Flu A&B, Covid) Nasopharyngeal Swab     Status: None   Collection Time: 01/27/21  9:06 PM   Specimen: Nasopharyngeal Swab; Nasopharyngeal(NP) swabs in vial transport medium  Result Value Ref Range Status   SARS Coronavirus 2 by RT PCR NEGATIVE NEGATIVE Final    Comment: (NOTE) SARS-CoV-2 target nucleic acids are NOT DETECTED.  The SARS-CoV-2 RNA is generally detectable in upper respiratory specimens during the acute phase of infection. The lowest concentration of SARS-CoV-2 viral copies this assay can detect is 138 copies/mL. A negative result does not preclude SARS-Cov-2 infection and should not be used as the sole basis for treatment or other patient management decisions. A negative result may occur with  improper specimen collection/handling, submission of specimen other than nasopharyngeal swab, presence of viral mutation(s) within the areas targeted by this assay, and inadequate number of viral copies(<138 copies/mL). A negative result must be combined with clinical observations, patient history, and  epidemiological information. The expected result is Negative.  Fact Sheet for Patients:  EntrepreneurPulse.com.au  Fact Sheet for Healthcare Providers:  IncredibleEmployment.be  This test is no t yet approved or cleared by the [place] FDA and  has been authorized for detection and/or diagnosis of SARS-CoV-2 by FDA under an Emergency Use Authorization (EUA). This EUA will remain  in  effect (meaning this test can be used) for the duration of the COVID-19 declaration under Section 564(b)(1) of the Act, 21 U.S.C.section 360bbb-3(b)(1), unless the authorization is terminated  or revoked sooner.       Influenza A by PCR NEGATIVE NEGATIVE Final   Influenza B by PCR NEGATIVE NEGATIVE Final    Comment: (NOTE) The Xpert Xpress SARS-CoV-2/FLU/RSV plus assay is intended as an aid in the diagnosis of influenza from Nasopharyngeal swab specimens and should not be used as a sole basis for treatment. Nasal washings and aspirates are unacceptable for Xpert Xpress SARS-CoV-2/FLU/RSV testing.  Fact Sheet for Patients: EntrepreneurPulse.com.au  Fact Sheet for Healthcare Providers: IncredibleEmployment.be  This test is not yet approved or cleared by the [place] FDA and has been authorized for detection and/or diagnosis of SARS-CoV-2 by FDA under an Emergency Use Authorization (EUA). This EUA will remain in effect (meaning this test can be used) for the duration of the COVID-19 declaration under Section 564(b)(1) of the Act, 21 U.S.C. section 360bbb-3(b)(1), unless the authorization is terminated or revoked.  Performed at [facility], [address] [place], [place] [address]     Coagulation Studies: No results for input(s): LABPROT, INR in the last 72 hours.  Urinalysis: No results for input(s): COLORURINE, LABSPEC, PHURINE, GLUCOSEU, HGBUR, BILIRUBINUR, KETONESUR, PROTEINUR, UROBILINOGEN, NITRITE, LEUKOCYTESUR in the last 72 hours.  Invalid input(s): APPERANCEUR    Imaging: CT Head Wo Contrast  Result Date: 01/27/2021 CLINICAL DATA:  Delirium.  Found lying face down on floor. EXAM: CT HEAD WITHOUT CONTRAST TECHNIQUE: Contiguous axial images were obtained from the base of the skull through the vertex without intravenous contrast. COMPARISON:  Head CT 01/12/2018 FINDINGS: Brain: Stable degree of atrophy and chronic  small vessel ischemia. No intracranial hemorrhage, mass effect, or midline shift. No hydrocephalus. The basilar cisterns are patent. No evidence of territorial infarct or acute ischemia. No extra-axial or intracranial fluid collection. Vascular: Atherosclerosis of skullbase vasculature without hyperdense vessel or abnormal calcification. Skull: No fracture or focal lesion. Sinuses/Orbits: Occasional opacification of lower right mastoid air cells. Paranasal sinuses are unremarkable. Again seen proptosis. Other: None. IMPRESSION: 1. No acute intracranial abnormality. 2. Stable atrophy and chronic small vessel ischemia. Electronically Signed   By: Keith Rake M.D.   On: 01/27/2021 22:20   DG Chest Portable 1 View  Result Date: 01/27/2021 CLINICAL DATA:  Shortness of breath. Found lying face down on floor. EXAM: PORTABLE CHEST 1 VIEW COMPARISON:  06/18/2019 FINDINGS: Chronic cardiomegaly. Vascular congestion also seen on prior exam. No convincing pulmonary edema. No focal airspace disease. No pleural fluid or pneumothorax. No acute osseous abnormalities are seen. Soft tissue attenuation from habitus limits assessment. IMPRESSION: Chronic cardiomegaly and vascular congestion. Electronically Signed   By: Keith Rake M.D.   On: 01/27/2021 21:26     Medications:    . allopurinol  100 mg Oral Daily  . aspirin EC  81 mg Oral Daily  . calcipotriene   Topical UD  . carvedilol  25 mg Oral Q12H  . cholecalciferol  2,000 Units Oral Daily  . furosemide  40 mg Oral BID  .  gabapentin  300 mg Oral QHS  . heparin  5,000 Units Subcutaneous Q8H  . hydrALAZINE  25 mg Oral BID  . insulin aspart  0-9 Units Subcutaneous TID PC & HS  . isosorbide mononitrate  60 mg Oral Daily  . latanoprost  1 drop Both Eyes QHS  . magnesium oxide  200 mg Oral Daily  . metolazone  5 mg Oral Daily  . multivitamin  1 tablet Oral Daily  . pantoprazole  40 mg Oral Daily  . umeclidinium-vilanterol  1 puff Inhalation Daily    acetaminophen **OR** acetaminophen, albuterol, cyclobenzaprine, labetalol, lidocaine-prilocaine, magnesium hydroxide, morphine injection, nitroGLYCERIN, ondansetron **OR** ondansetron (ZOFRAN) IV, polyethylene glycol powder, traZODone **OR** traZODone  Assessment/ Plan:  [name] is a 55 y.o. black male with end stage renal disease, sleep apnea, hypertension, diabetes mellitus type II insulin dependent, pulmonary hypertension, psoriasis, gout, hyperlipidemia, COPD, congestive heart failure who is admitted to [facility] on 01/27/2021 for Hypoglycemia [E16.2] ESRD (end stage renal disease) ([place]) [N18.6] Fall, initial encounter [W19.XXXA] Traumatic rhabdomyolysis, initial encounter ([place]) [T79.6XXA]  [facility] MWF [place] Left AVF 121kg.   1. End Stage renal disease: missed hemodialysis treatment yesterday. Placed on hemodialysis treatment. Received 90 minutes of dialysis treatment before patient became hypoxic and hypotensive. Rapid response was called and patient was taken off hemodialysis treatment. Patient bolus of 525mL NS and transferred to ICU.   2. Hypertension: with hypotension: hold carvedilol, furosemide, metolazone, hydralazine, isosorbide mononitrate. Will need echo and cardiac work up.   3. Anemia of chronic kidney disease: hemoglobin of 9.6.  Will need EPO   4. Secondary Hyperparathyroidism: with hyperphosphatemia. Not currently on binders.    LOS: 0 Sibyl Mikula 2/12/20222:28 PM

## 2021-01-29 ENCOUNTER — Inpatient Hospital Stay (HOSPITAL_COMMUNITY)
Admit: 2021-01-29 | Discharge: 2021-01-29 | Disposition: A | Discharge status: Home / Self Care | Payer: Medicare Other | Attending: Internal Medicine | Admitting: Internal Medicine

## 2021-01-29 DIAGNOSIS — I5032 Chronic diastolic (congestive) heart failure: Secondary | ICD-10-CM | POA: Diagnosis not present

## 2021-01-29 DIAGNOSIS — E162 Hypoglycemia, unspecified: Secondary | ICD-10-CM | POA: Diagnosis not present

## 2021-01-29 DIAGNOSIS — I959 Hypotension, unspecified: Secondary | ICD-10-CM | POA: Diagnosis not present

## 2021-01-29 DIAGNOSIS — N186 End stage renal disease: Secondary | ICD-10-CM | POA: Diagnosis not present

## 2021-01-29 DIAGNOSIS — J9621 Acute and chronic respiratory failure with hypoxia: Secondary | ICD-10-CM

## 2021-01-29 DIAGNOSIS — I5042 Chronic combined systolic (congestive) and diastolic (congestive) heart failure: Secondary | ICD-10-CM

## 2021-01-29 DIAGNOSIS — E8770 Fluid overload, unspecified: Secondary | ICD-10-CM | POA: Diagnosis present

## 2021-01-29 LAB — CBC WITH DIFFERENTIAL/PLATELET
Abs Immature Granulocytes: 0.04 10*3/uL (ref 0.00–0.07)
Basophils Absolute: 0 10*3/uL (ref 0.0–0.1)
Basophils Relative: 0 %
Eosinophils Absolute: 0.6 10*3/uL — ABNORMAL HIGH (ref 0.0–0.5)
Eosinophils Relative: 6 %
HCT: 32.3 % — ABNORMAL LOW (ref 39.0–52.0)
Hemoglobin: 9.8 g/dL — ABNORMAL LOW (ref 13.0–17.0)
Immature Granulocytes: 0 %
Lymphocytes Relative: 18 %
Lymphs Abs: 1.7 10*3/uL (ref 0.7–4.0)
MCH: 29 pg (ref 26.0–34.0)
MCHC: 30.3 g/dL (ref 30.0–36.0)
MCV: 95.6 fL (ref 80.0–100.0)
Monocytes Absolute: 0.9 10*3/uL (ref 0.1–1.0)
Monocytes Relative: 10 %
Neutro Abs: 6.1 10*3/uL (ref 1.7–7.7)
Neutrophils Relative %: 66 %
Platelets: 198 10*3/uL (ref 150–400)
RBC: 3.38 MIL/uL — ABNORMAL LOW (ref 4.22–5.81)
RDW: 15.3 % (ref 11.5–15.5)
WBC: 9.3 10*3/uL (ref 4.0–10.5)
nRBC: 0 % (ref 0.0–0.2)

## 2021-01-29 LAB — BASIC METABOLIC PANEL
Anion gap: 15 (ref 5–15)
BUN: 58 mg/dL — ABNORMAL HIGH (ref 6–20)
CO2: 24 mmol/L (ref 22–32)
Calcium: 8.9 mg/dL (ref 8.9–10.3)
Chloride: 100 mmol/L (ref 98–111)
Creatinine, Ser: 9.13 mg/dL — ABNORMAL HIGH (ref 0.61–1.24)
GFR, Estimated: 6 mL/min — ABNORMAL LOW (ref >60)
Glucose, Bld: 120 mg/dL — ABNORMAL HIGH (ref 70–99)
Potassium: 4.4 mmol/L (ref 3.5–5.1)
Sodium: 139 mmol/L (ref 135–145)

## 2021-01-29 LAB — ECHOCARDIOGRAM COMPLETE
AR max vel: 2.18 cm2
AV Area VTI: 2.31 cm2
AV Area mean vel: 2.18 cm2
AV Mean grad: 4 mmHg
AV Peak grad: 7.6 mmHg
Ao pk vel: 1.38 m/s
Area-P 1/2: 4 cm2
Height: 64 in
S' Lateral: 3.15 cm
Weight: 4271.63 oz

## 2021-01-29 LAB — GLUCOSE, CAPILLARY
Glucose-Capillary: 94 mg/dL (ref 70–99)
Glucose-Capillary: 139 mg/dL — ABNORMAL HIGH (ref 70–99)
Glucose-Capillary: 163 mg/dL — ABNORMAL HIGH (ref 70–99)
Glucose-Capillary: 168 mg/dL — ABNORMAL HIGH (ref 70–99)

## 2021-01-29 LAB — TROPONIN I (HIGH SENSITIVITY): Troponin I (High Sensitivity): 63 ng/L — ABNORMAL HIGH (ref <18)

## 2021-01-29 LAB — HEPATITIS B SURFACE ANTIGEN: Hepatitis B Surface Ag: NONREACTIVE

## 2021-01-29 MED ORDER — TRAMADOL HCL 50 MG PO TABS: 50.0000 mg | ORAL_TABLET | Freq: Four times a day (QID) | ORAL | Status: DC | PRN

## 2021-01-29 MED ORDER — DICLOFENAC SODIUM 1 % EX GEL
4.0000 g | Freq: Four times a day (QID) | CUTANEOUS | Status: DC
  Administered 2021-01-29 – 2021-01-31 (×7): 4 g via TOPICAL
  Filled 2021-01-29: qty 100

## 2021-01-29 NOTE — Consult Note (Addendum)
Cardiology Consultation:   Patient ID: [name]; [id number]; March 29, 1966   Admit date: 01/27/2021 Date of Consult: 01/29/2021  Primary Care Provider: [facility], [place] [facility] Primary Cardiologist: [place] Primary Electrophysiologist:  None   Patient Profile:   [name] is a 55 y.o. male with a hx of CAD status post remote PCI to the RCA, chronic combined systolic and diastolic CHF, pulmonary hypertension, ESRD on HD, DM2, HTN, HLD, morbid obesity with obesity hypoventilation syndrome, OSA on BiPAP, gout, and GERD who is being seen today for the evaluation of hypotension at the request of Dr. Arbutus Ped.  History of Present Illness:   [name] was previously followed by [facility], [facility], and [facility] though has subsequently established care with [facility].   He has had multiple hospital admissions over the years for various complaints, being seen by multiple different cardiology groups.  Earliest date is echo available for review from 05/2015 showed an EF of 50% with normal wall motion, grade 2 diastolic dysfunction, normal RV systolic function, and trivial TR.  Echo from 11/2015 showed an EF of 40%.  Cath from 04/2016 showed a patent stent in the RCA to his marginal branch with the remainder of the RCA being jailed.  Otherwise, there was no obstructive disease.   He established care with our office in the fall of 2020 for preoperative cardiac evaluation for AV fistula for hemodialysis.  Lexiscan MPI in 08/2019 showed no significant ischemia with a small region of fixed apical defect likely secondary to attenuation artifact though unable to exclude small region of old infarct with GI uptake artifact noted.  CT attenuation corrected images showed significant three-vessel coronary artery calcification and mild aortic atherosclerosis.  Overall, this was a low risk scan.  Echo in 09/2019 showed an EF of 45 to 50%, moderate LVH, diastolic  dysfunction, normal RV systolic function, mildly dilated left atrium, trace mitral regurgitation, trivial tricuspid regurgitation.   He notes since starting Trulicity, he has had issues with hypoglycemia and generalized weakness. He reports feeling very weak 2 days prior to his presentation and suffered a mechanical fall in this setting. He did not suffer LOC. He was too weak to get himself back to his feet. There was no associated chest pain, palpitations, dyspnea, or diaphoresis. He does report a history of hypotension associated with HD and a prior syncopal episode as well.   He was admitted to [facility] on 01/27/2021 with rhabdomyolysis and hypoglycemia after being found down in his home for approximately 48 hours by a friend after the patient did not show up for dialysis.  He was noted to be hypertensive with a BP of 198/88, hypoglycemic with a glucose of 33, BUN 56, serum creatinine 8.74, potassium 4.7, VBG with a pH of 7.26 and bicarb of 26, CK 3336, and an initial high-sensitivity troponin of 70 with a delta of 61 ultimately peaking at 86.  EKG showed sinus rhythm, 84 bpm, no acute ST-T changes.  CT head was nonacute.  Chest x-ray showed known cardiomegaly and vascular congestion.  In the ED patient complained of substernal chest discomfort.  While undergoing hemodialysis on 2/12 he developed sudden onset of hypotension and hypoxia with systolic blood pressure dropping into the 60s and O2 saturations dropping into the 80s on room air requiring nonrebreather.  Rapid response was called.  Patient never suffered LOC.  Repeat EKG not acute.  Repeat troponin following these events was downtrending.  Echo was ordered and is pending.  This morning he feels like he is getting better. He does continue to note generalized myalgias and arthralgias, particularly his bilateral knees. He reports he has been without chest pain throughout all of the above unless his chest is pushed on.    Past Medical History:  Diagnosis  Date   Bell's palsy    Bell's palsy    Cataract    CHF (congestive heart failure) (HCC)    Chronic kidney disease    dialysis   COPD (chronic obstructive pulmonary disease) (HCC)    Coronary artery disease    Diabetes mellitus without complication (HCC)    Dyspnea    Wheezing   GERD (gastroesophageal reflux disease)    Gout    HOH (hard of hearing)    mild   Hypercholesterolemia    Hypertension    NSVT (nonsustained ventricular tachycardia) (HCC)    Obstructive sleep apnea    CPAP, O2 use continuosly at 3LPM   Orthopnea    Oxygen dependent    3 lpm continuous   Pneumonia    In Past X2 most recent 1 yr ago   Psoriasis    Psoriasis    Pulmonary HTN ([place])    Renal failure     Past Surgical History:  Procedure Laterality Date   AV FISTULA PLACEMENT Right 11/20/2019   Procedure: ARTERIOVENOUS (AV) FISTULA CREATION ( BRACHIAL CEPHALIC );  Surgeon: Katha Cabal, MD;  Location: [facility] ORS;  Service: Vascular;  Laterality: Right;   CATARACT EXTRACTION W/PHACO Left 12/17/2019   Procedure: CATARACT EXTRACTION PHACO AND INTRAOCULAR LENS PLACEMENT ([place]) LEFT ISTENT INJ DIABETIC;  Surgeon: Eulogio Bear, MD;  Location: [facility] ORS;  Service: Ophthalmology;  Laterality: Left;  [place] 00:33.2 CDE 2.96 Fluid Pack Lot # [id number]   CATARACT EXTRACTION W/PHACO Right 02/24/2020   Procedure: CATARACT EXTRACTION PHACO AND INTRAOCULAR LENS PLACEMENT (IOC) RIGHT DIABETIC ISTENT INJ;  Surgeon: Eulogio Bear, MD;  Location: [facility] ORS;  Service: Ophthalmology;  Laterality: Right;  [place] 00:37.3 CDE 2.88 Fluid Pack Lot # [id number]   CORONARY ANGIOPLASTY WITH STENT PLACEMENT     stent placement   DIALYSIS/PERMA CATHETER INSERTION N/A 06/22/2019   Procedure: DIALYSIS/PERMA CATHETER INSERTION;  Surgeon: Algernon Huxley, MD;  Location: [place] CV [facility];  Service: Cardiovascular;  Laterality: N/A;   DIALYSIS/PERMA CATHETER REMOVAL N/A 05/31/2020   Procedure: DIALYSIS/PERMA CATHETER REMOVAL;  Surgeon:  Katha Cabal, MD;  Location: [place] CV LAB;  Service: Cardiovascular;  Laterality: N/A;   INSERTION OF AHMED VALVE Left 12/17/2019   Procedure: INSERTION OF iSTENT;  Surgeon: Eulogio Bear, MD;  Location: [facility] ORS;  Service: Ophthalmology;  Laterality: Left;     Home Meds: Prior to Admission medications   Medication Sig Start Date End Date Taking? Authorizing Provider  albuterol (PROVENTIL HFA;VENTOLIN HFA) 108 (90 Base) MCG/ACT inhaler Inhale 2 puffs into the lungs every 4 (four) hours as needed for wheezing or shortness of breath. 01/20/19  Yes Doles-Johnson, Teah, NP  allopurinol (ZYLOPRIM) 100 MG tablet Take 100 mg by mouth daily. 09/23/20 09/23/21 Yes [provider]  ANORO ELLIPTA 62.5-25 MCG/INH AEPB INHALE 1 PUFF INTO THE LUNGS DAILY. Patient taking differently: Inhale 1 puff into the lungs daily. 11/30/19  Yes Scarboro, Audie Clear, NP  atorvastatin (LIPITOR) 40 MG tablet Take 1 tablet (40 mg total) by mouth at bedtime. 03/21/20  Yes Scarboro, Audie Clear, NP  betamethasone dipropionate 0.05 % cream Apply topically as directed. Apply every AM for 2 weeks then decrease  to every other day 10/04/20  Yes Ralene Bathe, MD  calcipotriene (DOVONOX) 0.005 % cream Apply topically as directed. Apply every PM for two weeks then every other day alternating with the betamethasone 10/04/20  Yes Ralene Bathe, MD  Calcipotriene-Betameth Diprop (ENSTILAR) 0.005-0.064 % FOAM Apply to skin qd-bid 09/22/20  Yes Ralene Bathe, MD  calcium acetate (PHOSLO) 667 MG capsule Take 667 mg by mouth 3 (three) times daily. 01/17/21  Yes [provider]  carvedilol (COREG) 25 MG tablet Take 1 tablet (25 mg total) by mouth every 12 (twelve) hours. 03/21/20  Yes Scarboro, Audie Clear, NP  Cholecalciferol (D3-1000) 25 MCG (1000 UT) tablet Take 2 tablets (2,000 Units total) by mouth daily. 01/20/19  Yes Doles-Johnson, Teah, NP  Dulaglutide (TRULICITY) 1.5 HF/0.2OV SOPN Inject 1.5 sq every week 08/30/20   Yes Lavera Guise, MD  furosemide (LASIX) 40 MG tablet Take 1 tablet (40 mg total) by mouth 2 (two) times daily. 08/30/20  Yes Lavera Guise, MD  gabapentin (NEURONTIN) 300 MG capsule Take 300 mg by mouth daily. 01/17/21  Yes [provider]  GAVILAX 17 GM/SCOOP powder Take 17 g by mouth daily as needed for mild constipation or moderate constipation.  06/29/19  Yes [provider]  glimepiride (AMARYL) 4 MG tablet Take 4 mg by mouth daily. 11/27/19  Yes [provider]  GNP ASPIRIN LOW DOSE 81 MG EC tablet TAKE 1 TABLET BY MOUTH ONCE DAILY. 09/23/20  Yes Scarboro, Audie Clear, NP  hydrALAZINE (APRESOLINE) 100 MG tablet Take 100 mg by mouth 3 (three) times daily. 01/17/21  Yes [provider]  insulin degludec (TRESIBA FLEXTOUCH) 100 UNIT/ML FlexTouch Pen Inject 20 Units into the skin at bedtime. 08/30/20  Yes Lavera Guise, MD  isosorbide mononitrate (IMDUR) 60 MG 24 hr tablet TAKE 1 TABLET BY MOUTH ONCE DAILY. 08/30/20  Yes Lavera Guise, MD  Magnesium Oxide 200 MG TABS Take 1 tablet (200 mg total) by mouth daily. Patient taking differently: Take 100 mg by mouth daily. 01/12/20  Yes Scarboro, Audie Clear, NP  metolazone (ZAROXOLYN) 5 MG tablet Take 1 tablet (5 mg total) by mouth daily. 01/12/20  Yes Scarboro, Audie Clear, NP  Multiple Vitamins-Minerals (TAB-A-VITE) TABS TAKE 1 TABLET BY MOUTH ONCE DAILY. Patient taking differently: Take 1 tablet by mouth daily. 02/19/20  Yes Scarboro, Audie Clear, NP  multivitamin (RENA-VIT) TABS tablet Take 1 tablet by mouth at bedtime. Patient taking differently: Take 1 tablet by mouth daily. 06/25/19  Yes Ojie, Jude, MD  omeprazole (PRILOSEC) 20 MG capsule Take 1 capsule (20 mg total) by mouth 2 (two) times daily before a meal. 02/19/20  Yes Scarboro, Audie Clear, NP  Risankizumab-rzaa (SKYRIZI) 150 MG/ML SOSY Inject 150 mg into the skin as directed. Every 12 weeks for maintenance. 11/09/20  Yes Ralene Bathe, MD  traZODone (DESYREL) 150 MG tablet Take by mouth  at bedtime.   Yes [provider]  DUREZOL 0.05 % EMUL  04/14/20   [provider]  latanoprost (XALATAN) 0.005 % ophthalmic solution  08/30/20   [provider]  lidocaine-prilocaine (EMLA) cream Apply 1 application topically daily as needed (port).  05/18/20   [provider]    Inpatient Medications: Scheduled Meds:  allopurinol  100 mg Oral Daily   aspirin EC  81 mg Oral Daily   calcipotriene   Topical UD   Chlorhexidine Gluconate Cloth  6 each Topical Q0600   cholecalciferol  2,000 Units Oral Daily  gabapentin  300 mg Oral QHS   heparin  5,000 Units Subcutaneous Q8H   insulin aspart  0-9 Units Subcutaneous TID PC & HS   latanoprost  1 drop Both Eyes QHS   magnesium oxide  200 mg Oral Daily   mouth rinse  15 mL Mouth Rinse q12n4p   multivitamin  1 tablet Oral Daily   pantoprazole  40 mg Oral Daily   umeclidinium-vilanterol  1 puff Inhalation Daily   Continuous Infusions:   PRN Meds: acetaminophen **OR** acetaminophen, albuterol, cyclobenzaprine, labetalol, lidocaine-prilocaine, magnesium hydroxide, morphine injection, nitroGLYCERIN, ondansetron **OR** ondansetron (ZOFRAN) IV, polyethylene glycol powder, traZODone **OR** traZODone  Allergies:   Allergies  Allergen Reactions   Shellfish Allergy Anaphylaxis    Face and throat swelling, difficulty breathing Allergy can be triggered by touching (contact)   Betadine [Povidone Iodine] Rash   Povidone-Iodine Rash    Social History:   Social History   Socioeconomic History   Marital status: Single    Spouse name: Not on file   Number of children: Not on file   Years of education: Not on file   Highest education level: Not on file  Occupational History    Comment: disabled  Tobacco Use   Smoking status: Never Smoker   Smokeless tobacco: Never Used  Scientific laboratory technician Use: Never used  Substance and Sexual Activity   Alcohol use: Not Currently    Alcohol/week: 1.0 standard drink     Types: 1 Cans of beer per week    Comment: occasional   Drug use: Not Currently    Types: Marijuana    Comment: in high school    Sexual activity: Not on file  Other Topics Concern   Not on file  Social History Narrative   Patient uses continuous oxygen at 3L/min   Dialysis, AV fistula in L arm   Social Determinants of Health   Financial Resource Strain: Not on file  Food Insecurity: Not on file  Transportation Needs: Not on file  Physical Activity: Not on file  Stress: Not on file  Social Connections: Not on file  Intimate Partner Violence: Not on file     Family History:   Family History  Problem Relation Age of Onset   Heart failure Mother    Kidney failure Brother     ROS:  Review of Systems  Constitutional: Positive for malaise/fatigue. Negative for chills, diaphoresis, fever and weight loss.  HENT: Negative for congestion.   Eyes: Negative for discharge and redness.  Respiratory: Negative for cough, sputum production, shortness of breath and wheezing.   Cardiovascular: Positive for chest pain. Negative for palpitations, orthopnea, claudication, leg swelling and PND.       Chest pain associated with palpation  Gastrointestinal: Negative for abdominal pain, heartburn, nausea and vomiting.  Musculoskeletal: Positive for falls, joint pain and myalgias.  Skin: Negative for rash.  Neurological: Positive for weakness. Negative for dizziness, tingling, tremors, sensory change, speech change, focal weakness and loss of consciousness.  Endo/Heme/Allergies: Does not bruise/bleed easily.       Hypoglycemia  Psychiatric/Behavioral: Negative for substance abuse. The patient is not nervous/anxious.   All other systems reviewed and are negative.     Physical Exam/Data:   Vitals:   01/29/21 0134 01/29/21 0200 01/29/21 0400 01/29/21 0600  BP:  (!) 152/83 137/88   Pulse: 90 87 80 79  Resp:  13 12 15   Temp:      TempSrc:      SpO2: 92%  92% 98% 97%  Weight:      Height:         Intake/Output Summary (Last 24 hours) at 01/29/2021 0724 Last data filed at 01/28/2021 1500 Gross per 24 hour  Intake 720 ml  Output 656 ml  Net 64 ml   Filed Weights   01/27/21 2043  Weight: 121.1 kg   Body mass index is 45.83 kg/m.   Physical Exam: General: Well developed, well nourished, in no acute distress. Head: Normocephalic, atraumatic, sclera non-icteric, no xanthomas, nares without discharge.  Neck: Negative for carotid bruits. JVD difficult to assess secondary to body habitus. Lungs: Diminished breath sounds bilaterally. Breathing is unlabored. Heart: RRR with S1 S2. No murmurs, rubs, or gallops appreciated. Chest pain is fully reproducible to palpation on exam Abdomen: Soft, non-tender, non-distended with normoactive bowel sounds. No hepatomegaly. No rebound/guarding. No obvious abdominal masses. Msk:  Strength and tone appear normal for age. Extremities: No clubbing or cyanosis. Trace pretibial edema. Distal pedal pulses are 2+ and equal bilaterally. Neuro: Alert and oriented X 3. No facial asymmetry. No focal deficit. Moves all extremities spontaneously. Psych:  Responds to questions appropriately with a normal affect.   EKG:  The EKG was personally reviewed and demonstrates: sinus rhythm, 84 bpm, no acute ST-T changes Telemetry:  Telemetry was personally reviewed and demonstrates: SR with PACs, short run of SVT/atrial tach  Weights: Filed Weights   01/27/21 2043  Weight: 121.1 kg    Relevant CV Studies: As above  Laboratory Data:  Chemistry Recent Labs  Lab 01/27/21 2108 01/28/21 0500  NA 143 144  K 4.7 4.8  CL 105 105  CO2 23 27  GLUCOSE 123* 92  BUN 56* 62*  CREATININE 8.74* 9.14*  CALCIUM 9.0 8.7*  GFRNONAA 7* 6*  ANIONGAP 15 12    Recent Labs  Lab 01/27/21 2108  PROT 8.5*  ALBUMIN 3.9  AST 67*  ALT 26  ALKPHOS 150*  BILITOT 1.1   Hematology Recent Labs  Lab 01/27/21 2108 01/28/21 0500  WBC 11.2* 12.8*  RBC 3.35* 3.34*   HGB 10.0* 9.6*  HCT 31.4* 31.7*  MCV 93.7 94.9  MCH 29.9 28.7  MCHC 31.8 30.3  RDW 15.8* 15.5  PLT 216 205   Cardiac EnzymesNo results for input(s): TROPONINI in the last 168 hours. No results for input(s): TROPIPOC in the last 168 hours.  BNPNo results for input(s): BNP, PROBNP in the last 168 hours.  DDimer No results for input(s): DDIMER in the last 168 hours.  Radiology/Studies:  CT Head Wo Contrast  Result Date: 01/27/2021 IMPRESSION: 1. No acute intracranial abnormality. 2. Stable atrophy and chronic small vessel ischemia. Electronically Signed   By: Keith Rake M.D.   On: 01/27/2021 22:20   DG Chest Portable 1 View  Result Date: 01/27/2021 IMPRESSION: Chronic cardiomegaly and vascular congestion. Electronically Signed   By: Keith Rake M.D.   On: 01/27/2021 21:26    Assessment and Plan:   1. CAD involving native coronary arteries with elevated troponin:  -Not consistent with ACS  -No symptoms suggestive of angina, his chest pain is fully reproducible to palpation on nurse and my exam -Likely some degree of troponin anemia in the setting of his underlying known CAD, cardiomyopathy, and end-stage renal disease  -Echo pending  -No indication for heparin drip  -Would plan for ischemic testing prior to discharge with further recommendations based on echo  -No urgent indication for LHC  -ASA  -Would resume PTA atorvastatin, carvedilol, Imdur  as BP allows  2.  Acute hypoxic respiratory distress with associated weakness, hypoglycemia, and hypotension:  -He notes since starting Trulicity, he has had issues with hypoglycemia leading to syncope, this may be contributing to his current presentation  -He also reports prior dizziness and syncope associated with HD -With regards to his respiratory distress, possibly in the context of volume overload secondary to patient missing hemodialysis with hypotension occurring after approximately 1 L of fluid had been removed   -Repeat troponin down trending, EKG nonischemic making ACS less likely  -Supportive care  -Patient found down on the ground and with associated hypotension and hypoxia given his body habitus consider evaluation for PE, deferred to internal medicine  -Consider prn midodrine with HD -Echo pending with further recommendations based on this -Patient currently hemodynamically stable   3.  Acute on chronic combined systolic and diastolic CHF/pulmonary hypertension:  -Volume status is difficult to assess secondary to body habitus though it appears he is volume overloaded with volume management is largely per hemodialysis, though he is on Lasix and metolazone as an outpatient  -Echo pending with further ischemic testing recommendations based on these results  -Resume PTA carvedilol, Imdur/hydralazine as BP allows -Not on ARNI/MRA secondary to ESRD   4.  HTN:  -Episode of hypotension noted during HD on 2/12 as outlined above  -Blood pressure currently stable  -Continue medical therapy as outlined above   5.  HLD:  -LDL of 72 from 03/2020  -PTA atorvastatin   6.  Morbid obesity with likely OHS and OSA:  -Significantly contributing to his overall presentation  -Weight loss advised  -Nocturnal BiPAP per IM   For questions or updates, please contact [place] Please consult [email or web address] for contact info under Cardiology/STEMI.   Signed, Christell Faith, PA-C [place] Pager: [phone] 01/29/2021, 7:24 AM  Patient examined chart reviewed discussed care with PA/nurse and patient Having echo done So far EF appears normal with LVH. No effusion Primary issue was hypoglycemia with rhabdomyolysis and 48 hour down time. Exam with obese black male large renal fistula RUE with thrill, lungs clear anteriorly Bruising arms/legs with psoriasis and pressure lesions No evidence of acute ischemic event or arrhythmia causing event No further inpatient w/u if EF normal Needs DM adjusted Discussed  getting home alert system so this doesn't happen again  Jenkins Rouge MD [facility]

## 2021-01-29 NOTE — Plan of Care (Signed)

## 2021-01-29 NOTE — Evaluation (Signed)
Physical Therapy Evaluation Patient Details Name: [name] MRN: [id number] DOB: 14-May-1966 Today's Date: 01/29/2021   History of Present Illness  Patient is a 55 y.o. male with history of ESRD, hypertension, hyperlipidemia, here with generalized weakness after fall in his house where he was down for approximately 2 days. Found to have acute rhabdomyolysis, hypoglycemia, hypertensive urgency, chest pain likely atypical musculoskeletal due to fall.  Since admission, patient had hypoxic and hypotensive episode with dialysis treatment and required brief ICU stay.  Clinical Impression  Patient is cooperative and eager to get out of bed. Patient required minimal assistance only for trunk support initially to sit upright with bed mobility. Patient was able to stand and get to and from the bed side commode with Min guard- supervision. Patient has fair standing balance with unilateral UE support and was able to stand for ~ 3 minutes without loss of balance or shortness of breath. Sp02 100-94% on 3 L 02 during activity with heart rate 88-100bpm. Patient is hopeful to discharge home. Recommend PT follow up to maximize independence and address functional limitations listed below.     Follow Up Recommendations Home health PT    Equipment Recommendations  3in1 (PT)    Recommendations for Other Services       Precautions / Restrictions Precautions Precautions: Fall Precaution Comments: cardiac monitoring Restrictions Weight Bearing Restrictions: No      Mobility  Bed Mobility Overal bed mobility: Needs Assistance Bed Mobility: Supine to Sit;Sit to Supine     Supine to sit: Supervision;Min assist Sit to supine: HOB elevated;Supervision   General bed mobility comments: assistance only provided initially for upright trunk. otherwise, patient required supervision for bed mobility. no dizziness is reported in upright position    Transfers Overall transfer level: Needs  assistance Equipment used: Rolling walker (2 wheeled) (with and without rolling walker) Transfers: Stand Pivot Transfers   Stand pivot transfers: Min guard;Supervision       General transfer comment: Min guard for transfer from bed to bed side commode without rolling walker. Supervision for transfer from bed side commode to bed with rolling walker. cues for safety as needed  Ambulation/Gait             General Gait Details: not attempted  Stairs            Wheelchair Mobility    Modified Rankin (Stroke Patients Only)       Balance Overall balance assessment: Needs assistance Sitting-balance support: Feet supported Sitting balance-Leahy Scale: Good     Standing balance support: Single extremity supported Standing balance-Leahy Scale: Fair                               Pertinent Vitals/Pain Pain Assessment: No/denies pain    Home Living Family/patient expects to be discharged to:: Private residence Living Arrangements: Alone Available Help at Discharge: Friend(s);Available PRN/intermittently Type of Home: Apartment Home Access: Level entry     Home Layout: One level Home Equipment: Walker - 4 wheels (oxygen at 3 L all the time)      Prior Function Level of Independence: Independent with assistive device(s)         Comments: Mod I with rollator walker for ambulation. Patient reports independent with ADLs. Patient does not drive and has transportation set-up for dialysis. A friend assist with groceries and other things as needed     Hand Dominance        Extremity/Trunk  Assessment   Upper Extremity Assessment Upper Extremity Assessment: Overall WFL for tasks assessed    Lower Extremity Assessment Lower Extremity Assessment: Generalized weakness       Communication   Communication: No difficulties  Cognition Arousal/Alertness: Awake/alert Behavior During Therapy: WFL for tasks assessed/performed Overall Cognitive Status:  Within Functional Limits for tasks assessed                                        General Comments      Exercises     Assessment/Plan    PT Assessment Patient needs continued PT services  PT Problem List Decreased strength;Decreased range of motion;Decreased mobility;Decreased balance;Decreased activity tolerance;Decreased safety awareness;Decreased knowledge of precautions;Decreased knowledge of use of DME       PT Treatment Interventions DME instruction;Gait training;Stair training;Functional mobility training;Therapeutic activities;Therapeutic exercise;Balance training;Neuromuscular re-education;Patient/family education    PT Goals (Current goals can be found in the Care Plan section)  Acute Rehab PT Goals Patient Stated Goal: to go home and regain strength PT Goal Formulation: With patient Time For Goal Achievement: 02/12/21 Potential to Achieve Goals: Good    Frequency Min 2X/week   Barriers to discharge        Co-evaluation               AM-PAC PT "6 Clicks" Mobility  Outcome Measure Help needed turning from your back to your side while in a flat bed without using bedrails?: A Little Help needed moving from lying on your back to sitting on the side of a flat bed without using bedrails?: A Little Help needed moving to and from a bed to a chair (including a wheelchair)?: A Little Help needed standing up from a chair using your arms (e.g., wheelchair or bedside chair)?: A Little Help needed to walk in hospital room?: A Little Help needed climbing 3-5 steps with a railing? : A Lot 6 Click Score: 17    End of Session Equipment Utilized During Treatment: Oxygen Activity Tolerance: Patient tolerated treatment well Patient left: in chair;with call bell/phone within reach;with chair alarm set Nurse Communication: Mobility status PT Visit Diagnosis: Unsteadiness on feet (R26.81);Muscle weakness (generalized) (M62.81)    Time: 1423-1500 PT Time  Calculation (min) (ACUTE ONLY): 37 min   Charges:   PT Evaluation $PT Eval Moderate Complexity: 1 Mod PT Treatments $Therapeutic Activity: 8-22 mins        Minna Merritts, PT, MPT   Percell Locus 01/29/2021, 3:27 PM

## 2021-01-29 NOTE — Progress Notes (Signed)
[facility]  ROUNDING NOTE   Subjective:   Hemodialysis treatment yesterday. Became hypoxic and hypotensive after ultrafiltration of 1 liter within 90 minutes. Given 520mL fluid bolus and rapid response called. Patient moved to ICU.   When examined yesterday afternoon. Mental status, hemodynamics and oxygen saturation was at baseline. He was requiring 5 liters yesterday.   BIPAP overnight.  Currently on 2 L [place] O2.   Echo : diastolic dysfunction.    Objective:  Vital signs in last 24 hours:  Temp:  [98.6 F (37 C)-98.7 F (37.1 C)] 98.6 F (37 C) (02/12 2000) Pulse Rate:  [69-90] 74 (02/13 0700) Resp:  [10-21] 17 (02/13 0700) BP: (97-166)/(60-133) 160/85 (02/13 0700) SpO2:  [90 %-99 %] 94 % (02/13 0700)  Weight change:  Filed Weights   01/27/21 2043  Weight: 121.1 kg    Intake/Output: I/O last 3 completed shifts: In: 33 [P.O.:120; I.V.:600] Out: 656 [Other:656]   Intake/Output this shift:  No intake/output data recorded.  Physical Exam: General: NAD, laying in bed  Head: Normocephalic, atraumatic. Moist oral mucosal membranes  Eyes: Anicteric, PERRL  Neck: Supple, trachea midline  Lungs:  Diminished bilateral, reproducible chest pain  Heart: Regular rate and rhythm  Abdomen:  Soft, nontender,   Extremities:  + peripheral edema.  Neurologic: Nonfocal, moving all four extremities  Skin: No lesions  Access: Left AVF    Basic Metabolic Panel: Recent Labs  Lab 01/27/21 2108 01/28/21 0500  NA 143 144  K 4.7 4.8  CL 105 105  CO2 23 27  GLUCOSE 123* 92  BUN 56* 62*  CREATININE 8.74* 9.14*  CALCIUM 9.0 8.7*    Liver Function Tests: Recent Labs  Lab 01/27/21 2108  AST 67*  ALT 26  ALKPHOS 150*  BILITOT 1.1  PROT 8.5*  ALBUMIN 3.9   No results for input(s): LIPASE, AMYLASE in the last 168 hours. No results for input(s): AMMONIA in the last 168 hours.  CBC: Recent Labs  Lab 01/27/21 2108 01/28/21 0500  WBC 11.2* 12.8*   NEUTROABS 8.7*  --   HGB 10.0* 9.6*  HCT 31.4* 31.7*  MCV 93.7 94.9  PLT 216 205    Cardiac Enzymes: Recent Labs  Lab 01/27/21 2108 01/28/21 0124 01/28/21 0526 01/28/21 1402  CKTOTAL 3,336* 3,471* 3,382* 3,317*  CKMB  --   --   --  4.0    BNP: Invalid input(s): POCBNP  CBG: Recent Labs  Lab 01/28/21 1148 01/28/21 1432 01/28/21 1554 01/28/21 2153 01/29/21 0729  GLUCAP 195* 163* 130* 249* 94    Microbiology: Results for orders placed or performed during the hospital encounter of 01/27/21  Resp Panel by RT-PCR (Flu A&B, Covid) Nasopharyngeal Swab     Status: None   Collection Time: 01/27/21  9:06 PM   Specimen: Nasopharyngeal Swab; Nasopharyngeal(NP) swabs in vial transport medium  Result Value Ref Range Status   SARS Coronavirus 2 by RT PCR NEGATIVE NEGATIVE Final    Comment: (NOTE) SARS-CoV-2 target nucleic acids are NOT DETECTED.  The SARS-CoV-2 RNA is generally detectable in upper respiratory specimens during the acute phase of infection. The lowest concentration of SARS-CoV-2 viral copies this assay can detect is 138 copies/mL. A negative result does not preclude SARS-Cov-2 infection and should not be used as the sole basis for treatment or other patient management decisions. A negative result may occur with  improper specimen collection/handling, submission of specimen other than nasopharyngeal swab, presence of viral mutation(s) within the areas targeted by this assay,  and inadequate number of viral copies(<138 copies/mL). A negative result must be combined with clinical observations, patient history, and epidemiological information. The expected result is Negative.  Fact Sheet for Patients:  EntrepreneurPulse.com.au  Fact Sheet for Healthcare Providers:  IncredibleEmployment.be  This test is no t yet approved or cleared by the [place] FDA and  has been authorized for detection and/or diagnosis of SARS-CoV-2  by FDA under an Emergency Use Authorization (EUA). This EUA will remain  in effect (meaning this test can be used) for the duration of the COVID-19 declaration under Section 564(b)(1) of the Act, 21 U.S.C.section 360bbb-3(b)(1), unless the authorization is terminated  or revoked sooner.       Influenza A by PCR NEGATIVE NEGATIVE Final   Influenza B by PCR NEGATIVE NEGATIVE Final    Comment: (NOTE) The Xpert Xpress SARS-CoV-2/FLU/RSV plus assay is intended as an aid in the diagnosis of influenza from Nasopharyngeal swab specimens and should not be used as a sole basis for treatment. Nasal washings and aspirates are unacceptable for Xpert Xpress SARS-CoV-2/FLU/RSV testing.  Fact Sheet for Patients: EntrepreneurPulse.com.au  Fact Sheet for Healthcare Providers: IncredibleEmployment.be  This test is not yet approved or cleared by the [place] FDA and has been authorized for detection and/or diagnosis of SARS-CoV-2 by FDA under an Emergency Use Authorization (EUA). This EUA will remain in effect (meaning this test can be used) for the duration of the COVID-19 declaration under Section 564(b)(1) of the Act, 21 U.S.C. section 360bbb-3(b)(1), unless the authorization is terminated or revoked.  Performed at [facility], [address] [place], [place] [address]   MRSA PCR Screening     Status: None   Collection Time: 01/28/21  2:44 PM   Specimen: Nasal Mucosa; Nasopharyngeal  Result Value Ref Range Status   MRSA by PCR NEGATIVE NEGATIVE Final    Comment:        The GeneXpert MRSA Assay (FDA approved for NASAL specimens only), is one component of a comprehensive MRSA colonization surveillance program. It is not intended to diagnose MRSA infection nor to guide or monitor treatment for MRSA infections. Performed at [facility], [address] [place], [place] [address]     Coagulation Studies: No results for  input(s): LABPROT, INR in the last 72 hours.  Urinalysis: No results for input(s): COLORURINE, LABSPEC, PHURINE, GLUCOSEU, HGBUR, BILIRUBINUR, KETONESUR, PROTEINUR, UROBILINOGEN, NITRITE, LEUKOCYTESUR in the last 72 hours.  Invalid input(s): APPERANCEUR    Imaging: CT Head Wo Contrast  Result Date: 01/27/2021 CLINICAL DATA:  Delirium.  Found lying face down on floor. EXAM: CT HEAD WITHOUT CONTRAST TECHNIQUE: Contiguous axial images were obtained from the base of the skull through the vertex without intravenous contrast. COMPARISON:  Head CT 01/12/2018 FINDINGS: Brain: Stable degree of atrophy and chronic small vessel ischemia. No intracranial hemorrhage, mass effect, or midline shift. No hydrocephalus. The basilar cisterns are patent. No evidence of territorial infarct or acute ischemia. No extra-axial or intracranial fluid collection. Vascular: Atherosclerosis of skullbase vasculature without hyperdense vessel or abnormal calcification. Skull: No fracture or focal lesion. Sinuses/Orbits: Occasional opacification of lower right mastoid air cells. Paranasal sinuses are unremarkable. Again seen proptosis. Other: None. IMPRESSION: 1. No acute intracranial abnormality. 2. Stable atrophy and chronic small vessel ischemia. Electronically Signed   By: Keith Rake M.D.   On: 01/27/2021 22:20   DG Chest Portable 1 View  Result Date: 01/27/2021 CLINICAL DATA:  Shortness of breath. Found lying face down on floor. EXAM: PORTABLE CHEST  1 VIEW COMPARISON:  06/18/2019 FINDINGS: Chronic cardiomegaly. Vascular congestion also seen on prior exam. No convincing pulmonary edema. No focal airspace disease. No pleural fluid or pneumothorax. No acute osseous abnormalities are seen. Soft tissue attenuation from habitus limits assessment. IMPRESSION: Chronic cardiomegaly and vascular congestion. Electronically Signed   By: Keith Rake M.D.   On: 01/27/2021 21:26     Medications:    . allopurinol  100 mg Oral  Daily  . aspirin EC  81 mg Oral Daily  . calcipotriene   Topical UD  . Chlorhexidine Gluconate Cloth  6 each Topical Q0600  . cholecalciferol  2,000 Units Oral Daily  . gabapentin  300 mg Oral QHS  . heparin  5,000 Units Subcutaneous Q8H  . insulin aspart  0-9 Units Subcutaneous TID PC & HS  . latanoprost  1 drop Both Eyes QHS  . magnesium oxide  200 mg Oral Daily  . mouth rinse  15 mL Mouth Rinse q12n4p  . multivitamin  1 tablet Oral Daily  . pantoprazole  40 mg Oral Daily  . umeclidinium-vilanterol  1 puff Inhalation Daily   acetaminophen **OR** acetaminophen, albuterol, cyclobenzaprine, labetalol, lidocaine-prilocaine, magnesium hydroxide, morphine injection, nitroGLYCERIN, ondansetron **OR** ondansetron (ZOFRAN) IV, polyethylene glycol powder, traZODone **OR** traZODone  Assessment/ Plan:  Mr. JARAE PANAS is a 55 y.o. black male with end stage renal disease, sleep apnea, hypertension, diabetes mellitus type II insulin dependent, pulmonary hypertension, psoriasis, gout, hyperlipidemia, COPD, congestive heart failure who is admitted to [facility] on 01/27/2021 for Hypoglycemia [E16.2] ESRD (end stage renal disease) ([place]) [N18.6] Fall, initial encounter [W19.XXXA] Traumatic rhabdomyolysis, initial encounter ([place]) [T79.6XXA]  [facility] [place] Left AVF 121kg.   1. End Stage renal disease: missed hemodialysis treatment on 2/11. Placed on hemodialysis treatment yesterday. Received 90 minutes of dialysis treatment before patient became hypoxic and hypotensive. Rapid response was called and patient was taken off hemodialysis treatment. Patient bolus of 564mL NS and transferred to ICU.  No indication for dialysis at this time. Plan on hemodialysis treatment tomorrow.   2. Hypertension: with hypotension on treatment yesterday. Elevated blood pressures this morning. Home regimen of carvedilol, furosemide, metolazone, hydralazine, isosorbide mononitrate. Echo with diastolic  dysfunction  3. Anemia of chronic kidney disease: hemoglobin of 9.6.  - EPO with HD treatments  4. Secondary Hyperparathyroidism: with hyperphosphatemia. Not currently on binders.    LOS: 1 Gara Kincade 2/13/20228:21 AM

## 2021-01-29 NOTE — Progress Notes (Addendum)
PROGRESS NOTE    [name]   XIP:[id number]  DOB: 16-Apr-1966  PCP: [facility]    DOA: 01/27/2021 LOS: 1   Brief Narrative   55 year old male with past medical history of ESRD on dialysis MWF, hypertension, hyperlipidemia, COPD, OSA on trilogy at night, CAD, psoriasis presented to the ED on the evening of 01/27/2021 after being found down in his home for about 48 hours. He was found by a friend after patient did not show up for dialysis. On admission, patient reporting some midsternal chest discomfort, and aching pain in both legs and knees.  In the ED, BP uncontrolled 198/88, otherwise normal vitals. Labs remarkable for hypoglycemia initial blood glucose was 33, BUN 56, creatinine 8.74, potassium 4.7. VBG with pH 7.26 and bicarb 26. CK was 3336 and hs-troponin 70. EKG with normal sinus rhythm at a rate of 84 bpm without acute ischemic changes. Chest x-ray showed chronic cardiomegaly and vascular congestion.    Significant Events: --admitted 01/27/21 evening --rapid response for acute onset hypotension & hypoxia in dialysis --> stepdown   Assessment & Plan   Principal Problem:   Volume overload Active Problems:   End stage renal disease (HCC)   Hypoglycemia   Acute on chronic respiratory failure with hypoxia (HCC)   Hypotension   DM type 2 with diabetic peripheral neuropathy (HCC)   Essential hypertension   CAD (coronary artery disease)   Chronic diastolic heart failure (HCC)   Psoriasis   Chronic gout of multiple sites   Hyperlipidemia, unspecified   Obstructive sleep apnea   Morbid obesity ([place])   Obesity hypoventilation syndrome ([place])   Hypoglycemia -present on admission, resolved.  In the setting of being down for 48 hours, no p.o. intake. Treated with D50 twice. Was briefly placed on D5w, stopped given volume overload.  Patient now eating and drinking and sugars improved.  Type 2 diabetes with renal complications - stop glimepiride (would stop it at  d/c, sulfonylureas in dialysis patients increase risk of hypoglycemia).  Takes Tresiba 20 units bedtime.  Hold off basal insulin until sugars stable.  Sliding scale Novolog for now.  Fall of unknown known cause -patient down in his home for 48 hours and found by a friend after not showing up for dialysis. Cause unknown. Patient reports some chest discomfort but has no ischemic changes on EKG and only mild flat troponin elevation. --PT evaluation pending  Rhabdomyolysis -CK over 3000 but in ESRD is not considered significant elevation.  He was treated with normal saline overnight upon admission, since stopped to prevent recurrent volume overload.     Acute respiratory failure with hypoxia -sudden onset during dialysis 2/12. Appears related to volume overload. Supplemental oxygen to maintain O2 sat 88 to 93% and wean as tolerated.  Hypotension -resolved.  Sudden onset during dialysis today 2/12. Occurred after 1 L had been taken off. Patient was given 500 cc bolus and blood pressure rebounded and is again elevated. Transferred to stepdown for close monitoring.  BPs have since stabilized.  Transfer to [place] today.  ESRD -on dialysis MWF schedule. Dialysis session 2/12 aborted early due to rapid response outlined above.  Nephrology is following.  GERD -continue PPI  Coronary artery disease -continue aspirin, statin and Imdur.   HFrEF - Echo 09/2019 EF mildly reced at 45-50%, mod LVH.    Gout -does not appear acutely flared but monitor closely. Continue allopurinol  Hyperlipidemia -continue statin  COPD -without acute exacerbation on admission. Continue home inhaler regimen.  OSA -uses Trilogy NIV at home.  BiPAP for overnight ordered.   Morbid obesity: Body mass index is 45.83 kg/m. Complicates overall care and prognosis. Recommend lifestyle modifications including physical activity and diet aimed at weight loss. Primary care follow-up.  DVT prophylaxis: heparin injection 5,000 Units  Start: 01/27/21 2330   Diet:  Diet Orders (From admission, onward)    Start     Ordered   01/28/21 1505  Diet Carb Modified Fluid consistency: Thin; Room service appropriate? Yes; Fluid restriction: 1200 mL Fluid  Diet effective now       Question Answer Comment  Diet-HS Snack? Nothing   Calorie Level Medium 1600-2000   Fluid consistency: Thin   Room service appropriate? Yes   Fluid restriction: 1200 mL Fluid      01/28/21 1504            Code Status: Full Code    Subjective 01/29/21    Pt seen this morning in stepdown unit.  Feeling much better today.  He reports that he did not do any further dialysis yesterday.  Still having chest pain today which is reproducible with pressing on his sternum.  Also still complaining of knee pain bilaterally.  Expresses concern about his ability to ambulate.  Will have PT see him.   Disposition Plan & Communication   Status is: Inpatient  Remains inpatient appropriate because:Inpatient level of care appropriate due to severity of illness  As outlined above.  Dispo: The patient is from: Home              Anticipated d/c is to: Home              Anticipated d/c date is: 2 days              Patient currently is not medically stable to d/c.   Difficult to place patient No  Family Communication: none at bedside, will attempt to call    Consults, Procedures, Significant Events   Consultants:   Nephrology  Procedures:   Dialysis  Antimicrobials:  Anti-infectives (From admission, onward)   None        Objective   Vitals:   01/29/21 1000 01/29/21 1100 01/29/21 1200 01/29/21 1611  BP: (!) 149/85 (!) 155/71 (!) 139/91 123/79  Pulse: 72 78 69 75  Resp: 13 11 12 18   Temp:    98.1 F (36.7 C)  TempSrc:      SpO2: 100% 98% 97% 97%  Weight:      Height:        Intake/Output Summary (Last 24 hours) at 01/29/2021 1715 Last data filed at 01/29/2021 2130 Gross per 24 hour  Intake 200 ml  Output --  Net 200 ml   Filed  Weights   01/27/21 2043  Weight: 121.1 kg    Physical Exam:  General exam: awake, alert, no acute distress, morbidly obese Respiratory system: diminished, no wheezes, on 3 L/min [place] oxygen Cardiovascular system: normal S1/S2, RRR, BLE edema  Gastrointestinal system: non-tender, + bowel sounds Central nervous system: A&O x3. no gross focal neurologic deficits, normal speech Skin: diffuse scattered areas of hyperpigmentation due to psoriasis, dry, intact Psychiatry: normal mood, congruent affect, judgement and insight appear normal  Labs   Data Reviewed: I have personally reviewed following labs and imaging studies  CBC: Recent Labs  Lab 01/27/21 2108 01/28/21 0500 01/29/21 0906  WBC 11.2* 12.8* 9.3  NEUTROABS 8.7*  --  6.1  HGB 10.0* 9.6* 9.8*  HCT 31.4* 31.7*  32.3*  MCV 93.7 94.9 95.6  PLT 216 205 370   Basic Metabolic Panel: Recent Labs  Lab 01/27/21 2108 01/28/21 0500 01/29/21 0906  NA 143 144 139  K 4.7 4.8 4.4  CL 105 105 100  CO2 23 27 24   GLUCOSE 123* 92 120*  BUN 56* 62* 58*  CREATININE 8.74* 9.14* 9.13*  CALCIUM 9.0 8.7* 8.9   GFR: Estimated Creatinine Clearance: 11.2 mL/min (A) (by C-G formula based on SCr of 9.13 mg/dL (H)). Liver Function Tests: Recent Labs  Lab 01/27/21 2108  AST 67*  ALT 26  ALKPHOS 150*  BILITOT 1.1  PROT 8.5*  ALBUMIN 3.9   No results for input(s): LIPASE, AMYLASE in the last 168 hours. No results for input(s): AMMONIA in the last 168 hours. Coagulation Profile: No results for input(s): INR, PROTIME in the last 168 hours. Cardiac Enzymes: Recent Labs  Lab 01/27/21 2108 01/28/21 0124 01/28/21 0526 01/28/21 1402  CKTOTAL 3,336* 3,471* 3,382* 3,317*  CKMB  --   --   --  4.0   BNP (last 3 results) No results for input(s): PROBNP in the last 8760 hours. HbA1C: Recent Labs    01/28/21 0124  HGBA1C 5.6   CBG: Recent Labs  Lab 01/28/21 1554 01/28/21 2153 01/29/21 0729 01/29/21 1125 01/29/21 1651  GLUCAP  130* 249* 94 139* 163*   Lipid Profile: No results for input(s): CHOL, HDL, LDLCALC, TRIG, CHOLHDL, LDLDIRECT in the last 72 hours. Thyroid Function Tests: No results for input(s): TSH, T4TOTAL, FREET4, T3FREE, THYROIDAB in the last 72 hours. Anemia Panel: No results for input(s): VITAMINB12, FOLATE, FERRITIN, TIBC, IRON, RETICCTPCT in the last 72 hours. Sepsis Labs: Recent Labs  Lab 01/27/21 2108 01/28/21 1521 01/28/21 1835  LATICACIDVEN 1.3 1.4 1.1    Recent Results (from the past 240 hour(s))  Resp Panel by RT-PCR (Flu A&B, Covid) Nasopharyngeal Swab     Status: None   Collection Time: 01/27/21  9:06 PM   Specimen: Nasopharyngeal Swab; Nasopharyngeal(NP) swabs in vial transport medium  Result Value Ref Range Status   SARS Coronavirus 2 by RT PCR NEGATIVE NEGATIVE Final    Comment: (NOTE) SARS-CoV-2 target nucleic acids are NOT DETECTED.  The SARS-CoV-2 RNA is generally detectable in upper respiratory specimens during the acute phase of infection. The lowest concentration of SARS-CoV-2 viral copies this assay can detect is 138 copies/mL. A negative result does not preclude SARS-Cov-2 infection and should not be used as the sole basis for treatment or other patient management decisions. A negative result may occur with  improper specimen collection/handling, submission of specimen other than nasopharyngeal swab, presence of viral mutation(s) within the areas targeted by this assay, and inadequate number of viral copies(<138 copies/mL). A negative result must be combined with clinical observations, patient history, and epidemiological information. The expected result is Negative.  Fact Sheet for Patients:  EntrepreneurPulse.com.au  Fact Sheet for Healthcare Providers:  IncredibleEmployment.be  This test is no t yet approved or cleared by the [place] FDA and  has been authorized for detection and/or diagnosis of SARS-CoV-2 by FDA  under an Emergency Use Authorization (EUA). This EUA will remain  in effect (meaning this test can be used) for the duration of the COVID-19 declaration under Section 564(b)(1) of the Act, 21 U.S.C.section 360bbb-3(b)(1), unless the authorization is terminated  or revoked sooner.       Influenza A by PCR NEGATIVE NEGATIVE Final   Influenza B by PCR NEGATIVE NEGATIVE Final    Comment: (NOTE)  The Xpert Xpress SARS-CoV-2/FLU/RSV plus assay is intended as an aid in the diagnosis of influenza from Nasopharyngeal swab specimens and should not be used as a sole basis for treatment. Nasal washings and aspirates are unacceptable for Xpert Xpress SARS-CoV-2/FLU/RSV testing.  Fact Sheet for Patients: EntrepreneurPulse.com.au  Fact Sheet for Healthcare Providers: IncredibleEmployment.be  This test is not yet approved or cleared by the [place] FDA and has been authorized for detection and/or diagnosis of SARS-CoV-2 by FDA under an Emergency Use Authorization (EUA). This EUA will remain in effect (meaning this test can be used) for the duration of the COVID-19 declaration under Section 564(b)(1) of the Act, 21 U.S.C. section 360bbb-3(b)(1), unless the authorization is terminated or revoked.  Performed at [facility], [address] [place], [place] [address]   MRSA PCR Screening     Status: None   Collection Time: 01/28/21  2:44 PM   Specimen: Nasal Mucosa; Nasopharyngeal  Result Value Ref Range Status   MRSA by PCR NEGATIVE NEGATIVE Final    Comment:        The GeneXpert MRSA Assay (FDA approved for NASAL specimens only), is one component of a comprehensive MRSA colonization surveillance program. It is not intended to diagnose MRSA infection nor to guide or monitor treatment for MRSA infections. Performed at [facility], [address] [place], [place] [address]       Imaging Studies   CT Head Wo  Contrast  Result Date: 01/27/2021 CLINICAL DATA:  Delirium.  Found lying face down on floor. EXAM: CT HEAD WITHOUT CONTRAST TECHNIQUE: Contiguous axial images were obtained from the base of the skull through the vertex without intravenous contrast. COMPARISON:  Head CT 01/12/2018 FINDINGS: Brain: Stable degree of atrophy and chronic small vessel ischemia. No intracranial hemorrhage, mass effect, or midline shift. No hydrocephalus. The basilar cisterns are patent. No evidence of territorial infarct or acute ischemia. No extra-axial or intracranial fluid collection. Vascular: Atherosclerosis of skullbase vasculature without hyperdense vessel or abnormal calcification. Skull: No fracture or focal lesion. Sinuses/Orbits: Occasional opacification of lower right mastoid air cells. Paranasal sinuses are unremarkable. Again seen proptosis. Other: None. IMPRESSION: 1. No acute intracranial abnormality. 2. Stable atrophy and chronic small vessel ischemia. Electronically Signed   By: Keith Rake M.D.   On: 01/27/2021 22:20   DG Chest Portable 1 View  Result Date: 01/27/2021 CLINICAL DATA:  Shortness of breath. Found lying face down on floor. EXAM: PORTABLE CHEST 1 VIEW COMPARISON:  06/18/2019 FINDINGS: Chronic cardiomegaly. Vascular congestion also seen on prior exam. No convincing pulmonary edema. No focal airspace disease. No pleural fluid or pneumothorax. No acute osseous abnormalities are seen. Soft tissue attenuation from habitus limits assessment. IMPRESSION: Chronic cardiomegaly and vascular congestion. Electronically Signed   By: Keith Rake M.D.   On: 01/27/2021 21:26   ECHOCARDIOGRAM COMPLETE  Result Date: 01/29/2021    ECHOCARDIOGRAM REPORT   Patient Name:   [name] Date of Exam: 01/29/2021 Medical Rec #:  [id number]          Height:       64.0 in Accession #:    [id number]         Weight:       267.0 lb Date of Birth:  [birth year]          BSA:          2.212 m Patient Age:    10 years            BP:  142/81 mmHg Patient Gender: M                  HR:           81 bpm. Exam Location:  [facility] Procedure: 2D Echo Indications:     Systolic CHF  History:         Patient has prior history of Echocardiogram examinations. CAD,                  Pulmonary HTN and COPD; Risk Factors:Hypertension, Diabetes and                  Morbid Obesity.  Sonographer:     Wallace Keller Thornton-Maynard Referring Phys:  [id number] Claiborne Billings A Briza Bark Diagnosing Phys: Jenkins Rouge MD  Sonographer Comments: Suboptimal subcostal window. Image acquisition challenging due to patient body habitus. IMPRESSIONS  1. Left ventricular ejection fraction, by estimation, is 55 to 60%. The left ventricle has normal function. The left ventricle has no regional wall motion abnormalities. There is moderate left ventricular hypertrophy. Left ventricular diastolic parameters are consistent with Grade II diastolic dysfunction (pseudonormalization). Elevated left ventricular end-diastolic pressure.  2. Right ventricular systolic function is normal. The right ventricular size is normal.  3. The mitral valve is normal in structure. No evidence of mitral valve regurgitation. No evidence of mitral stenosis.  4. The aortic valve was not well visualized. Aortic valve regurgitation is not visualized. Mild to moderate aortic valve sclerosis/calcification is present, without any evidence of aortic stenosis.  5. The inferior vena cava is normal in size with greater than 50% respiratory variability, suggesting right atrial pressure of 3 mmHg. FINDINGS  Left Ventricle: Left ventricular ejection fraction, by estimation, is 55 to 60%. The left ventricle has normal function. The left ventricle has no regional wall motion abnormalities. The left ventricular internal cavity size was normal in size. There is  moderate left ventricular hypertrophy. Left ventricular diastolic parameters are consistent with Grade II diastolic dysfunction (pseudonormalization). Elevated  left ventricular end-diastolic pressure. Right Ventricle: The right ventricular size is normal. No increase in right ventricular wall thickness. Right ventricular systolic function is normal. Left Atrium: Left atrial size was normal in size. Right Atrium: Right atrial size was not well visualized. Pericardium: There is no evidence of pericardial effusion. Mitral Valve: The mitral valve is normal in structure. There is mild thickening of the mitral valve leaflet(s). There is mild calcification of the mitral valve leaflet(s). Mild mitral annular calcification. No evidence of mitral valve regurgitation. No evidence of mitral valve stenosis. Tricuspid Valve: The tricuspid valve is normal in structure. Tricuspid valve regurgitation is mild . No evidence of tricuspid stenosis. Aortic Valve: The aortic valve was not well visualized. Aortic valve regurgitation is not visualized. Mild to moderate aortic valve sclerosis/calcification is present, without any evidence of aortic stenosis. Aortic valve mean gradient measures 4.0 mmHg.  Aortic valve peak gradient measures 7.6 mmHg. Aortic valve area, by VTI measures 2.31 cm. Pulmonic Valve: The pulmonic valve was normal in structure. Pulmonic valve regurgitation is not visualized. No evidence of pulmonic stenosis. Aorta: The aortic root is normal in size and structure. Venous: The inferior vena cava is normal in size with greater than 50% respiratory variability, suggesting right atrial pressure of 3 mmHg. IAS/Shunts: No atrial level shunt detected by color flow Doppler.  LEFT VENTRICLE PLAX 2D LVIDd:         4.95 cm  Diastology LVIDs:         3.15 cm  LV e' medial:    6.85 cm/s LV PW:         1.58 cm  LV E/e' medial:  15.5 LV IVS:        1.85 cm  LV e' lateral:   6.53 cm/s LVOT diam:     2.40 cm  LV E/e' lateral: 16.2 LV SV:         54 LV SV Index:   25 LVOT Area:     4.52 cm  LEFT ATRIUM              Index LA diam:        2.80 cm  1.27 cm/m LA Vol (A2C):   113.0 ml 51.09  ml/m LA Vol (A4C):   74.1 ml  33.50 ml/m LA Biplane Vol: 98.1 ml  44.35 ml/m  AORTIC VALVE                   PULMONIC VALVE AV Area (Vmax):    2.18 cm    PV Vmax:       1.06 m/s AV Area (Vmean):   2.18 cm    PV Peak grad:  4.5 mmHg AV Area (VTI):     2.31 cm AV Vmax:           138.00 cm/s AV Vmean:          95.400 cm/s AV VTI:            0.235 m AV Peak Grad:      7.6 mmHg AV Mean Grad:      4.0 mmHg LVOT Vmax:         66.60 cm/s LVOT Vmean:        45.900 cm/s LVOT VTI:          0.120 m LVOT/AV VTI ratio: 0.51  AORTA Ao Root diam: 3.50 cm MITRAL VALVE MV Area (PHT): 4.00 cm     SHUNTS MV E velocity: 106.00 cm/s  Systemic VTI:  0.12 m MV A velocity: 72.80 cm/s   Systemic Diam: 2.40 cm MV E/A ratio:  1.46 Jenkins Rouge MD Electronically signed by Jenkins Rouge MD Signature Date/Time: 01/29/2021/10:30:06 AM    Final      Medications   Scheduled Meds: . allopurinol  100 mg Oral Daily  . aspirin EC  81 mg Oral Daily  . calcipotriene   Topical UD  . Chlorhexidine Gluconate Cloth  6 each Topical Q0600  . cholecalciferol  2,000 Units Oral Daily  . diclofenac Sodium  4 g Topical QID  . gabapentin  300 mg Oral QHS  . heparin  5,000 Units Subcutaneous Q8H  . insulin aspart  0-9 Units Subcutaneous TID PC & HS  . latanoprost  1 drop Both Eyes QHS  . magnesium oxide  200 mg Oral Daily  . mouth rinse  15 mL Mouth Rinse q12n4p  . multivitamin  1 tablet Oral Daily  . pantoprazole  40 mg Oral Daily  . umeclidinium-vilanterol  1 puff Inhalation Daily   Continuous Infusions:     LOS: 1 day    Time spent: 25 minutes with > 50% spent at bedside and in coordination of care.    Ezekiel Slocumb, DO [facility]  01/29/2021, 5:15 PM      If 7PM-7AM, please contact night-coverage. How to contact the [facility] Attending or Consulting provider [place] or covering provider during after hours [place], for this patient?    1. Check the care team in  [facility] and look for a) attending/consulting [facility]  provider listed and b) the [facility] team listed 2. Log into [email or web address] and use [place]'s universal password to access. If you do not have the password, please contact the hospital operator. 3. Locate the [facility] provider you are looking for under [facility] and page to a number that you can be directly reached. 4. If you still have difficulty reaching the provider, please page the [facility] (Director on Call) for the Hospitalists listed on amion for assistance.

## 2021-01-30 DIAGNOSIS — I248 Other forms of acute ischemic heart disease: Secondary | ICD-10-CM

## 2021-01-30 DIAGNOSIS — E8770 Fluid overload, unspecified: Secondary | ICD-10-CM

## 2021-01-30 DIAGNOSIS — J9621 Acute and chronic respiratory failure with hypoxia: Secondary | ICD-10-CM | POA: Diagnosis not present

## 2021-01-30 DIAGNOSIS — I5032 Chronic diastolic (congestive) heart failure: Secondary | ICD-10-CM | POA: Diagnosis not present

## 2021-01-30 DIAGNOSIS — N186 End stage renal disease: Secondary | ICD-10-CM | POA: Diagnosis not present

## 2021-01-30 LAB — RENAL FUNCTION PANEL
Albumin: 3.4 g/dL — ABNORMAL LOW (ref 3.5–5.0)
Anion gap: 12 (ref 5–15)
BUN: 46 mg/dL — ABNORMAL HIGH (ref 6–20)
CO2: 26 mmol/L (ref 22–32)
Calcium: 8.5 mg/dL — ABNORMAL LOW (ref 8.9–10.3)
Chloride: 100 mmol/L (ref 98–111)
Creatinine, Ser: 6.39 mg/dL — ABNORMAL HIGH (ref 0.61–1.24)
GFR, Estimated: 10 mL/min — ABNORMAL LOW (ref >60)
Glucose, Bld: 129 mg/dL — ABNORMAL HIGH (ref 70–99)
Phosphorus: 4.4 mg/dL (ref 2.5–4.6)
Potassium: 3.4 mmol/L — ABNORMAL LOW (ref 3.5–5.1)
Sodium: 138 mmol/L (ref 135–145)

## 2021-01-30 LAB — CBC
HCT: 30 % — ABNORMAL LOW (ref 39.0–52.0)
Hemoglobin: 9.3 g/dL — ABNORMAL LOW (ref 13.0–17.0)
MCH: 29 pg (ref 26.0–34.0)
MCHC: 31 g/dL (ref 30.0–36.0)
MCV: 93.5 fL (ref 80.0–100.0)
Platelets: 179 10*3/uL (ref 150–400)
RBC: 3.21 MIL/uL — ABNORMAL LOW (ref 4.22–5.81)
RDW: 15 % (ref 11.5–15.5)
WBC: 6.9 10*3/uL (ref 4.0–10.5)
nRBC: 0 % (ref 0.0–0.2)

## 2021-01-30 LAB — GLUCOSE, CAPILLARY
Glucose-Capillary: 159 mg/dL — ABNORMAL HIGH (ref 70–99)
Glucose-Capillary: 100 mg/dL — ABNORMAL HIGH (ref 70–99)
Glucose-Capillary: 87 mg/dL (ref 70–99)
Glucose-Capillary: 166 mg/dL — ABNORMAL HIGH (ref 70–99)

## 2021-01-30 LAB — HEPATITIS B DNA, ULTRAQUANTITATIVE, PCR
HBV DNA SERPL PCR-ACNC: NOT DETECTED IU/mL
HBV DNA SERPL PCR-LOG IU: UNDETERMINED log10 IU/mL

## 2021-01-30 LAB — HEPATITIS B SURFACE ANTIBODY, QUANTITATIVE: Hep B S AB Quant (Post): <3.1 m[IU]/mL — ABNORMAL LOW (ref >9.9)

## 2021-01-30 MED ORDER — EPOETIN ALFA 10000 UNIT/ML IJ SOLN: 4000.0000 [IU] | INTRAMUSCULAR | Status: DC

## 2021-01-30 NOTE — Progress Notes (Signed)
Physical Therapy Treatment Patient Details Name: [name] MRN: [id number] DOB: 06/07/1966 Today's Date: 01/30/2021    History of Present Illness Patient is a 55 y.o. male with history of ESRD, hypertension, hyperlipidemia, here with generalized weakness after fall in his house where he was down for approximately 2 days. Found to have acute rhabdomyolysis, hypoglycemia, hypertensive urgency, chest pain likely atypical musculoskeletal due to fall.  Since admission, patient had hypoxic and hypotensive episode with dialysis treatment and required brief ICU stay.    PT Comments    Pt alert, in bed, eager for therapy, denied pain. Overall the patient demonstrated improvements in functional mobility, activity tolerance, and endurance, but still reported decreased strength and ambulation ability compared to baseline. Pt able to perform bed mobility with supervision and sit EOB with good balance. Sit <> stand with RW and supervision, ambulated to sink for ADLs. Oxygen doffed to wipe his face, spO2 dropped to low 80s, returned to sitting and oxygen donned with spO2 >90% in 1-2 minutes. He was also able to ambulate ~125ft with RW and CGA, decreased gait velocity and stride length noted, but no LOB. Returned to room and up in chair with all needs in reach. The patient would benefit from further skilled PT intervention to continue to progress towards goals. Recommendation remains appropriate. CNA notified of pt request for bath.      Follow Up Recommendations  Home health PT     Equipment Recommendations  3in1 (PT)    Recommendations for Other Services       Precautions / Restrictions Precautions Precautions: Fall Restrictions Weight Bearing Restrictions: No    Mobility  Bed Mobility Overal bed mobility: Needs Assistance Bed Mobility: Supine to Sit     Supine to sit: Supervision;HOB elevated          Transfers Overall transfer level: Needs assistance Equipment used: Rolling  walker (2 wheeled);None Transfers: Sit to/from Stand Sit to Stand: Min guard;Supervision         General transfer comment: Pt able to stand at EOB without RW and stretch overhead. asked to return to sitting to allow for room set up. performed again with RW (per baseline)  Ambulation/Gait Ambulation/Gait assistance: Min guard Gait Distance (Feet):  (39ft, after seated rest break 151ft) Assistive device: Rolling walker (2 wheeled)   Gait velocity: decreased   General Gait Details: decreased stride length bilaterally, pt self directive of gait speed   Stairs             Wheelchair Mobility    Modified Rankin (Stroke Patients Only)       Balance Overall balance assessment: Needs assistance Sitting-balance support: Feet supported Sitting balance-Leahy Scale: Good     Standing balance support: Bilateral upper extremity supported;Single extremity supported Standing balance-Leahy Scale: Good                              Cognition Arousal/Alertness: Awake/alert Behavior During Therapy: WFL for tasks assessed/performed Overall Cognitive Status: Within Functional Limits for tasks assessed                                        Exercises Other Exercises Other Exercises: Pt able to stand at sink and brush teeth/wash face. O2 doffed for face wipe, spO2 dropped to 82%. returned to sit EOB with oxygen donned and spO2 >90% within 1-2  minutes.    General Comments        Pertinent Vitals/Pain Pain Assessment: No/denies pain    Home Living                      Prior Function            PT Goals (current goals can now be found in the care plan section) Progress towards PT goals: Progressing toward goals    Frequency    Min 2X/week      PT Plan Current plan remains appropriate    Co-evaluation              AM-PAC PT "6 Clicks" Mobility   Outcome Measure  Help needed turning from your back to your side while in  a flat bed without using bedrails?: None Help needed moving from lying on your back to sitting on the side of a flat bed without using bedrails?: None Help needed moving to and from a bed to a chair (including a wheelchair)?: A Little Help needed standing up from a chair using your arms (e.g., wheelchair or bedside chair)?: None Help needed to walk in hospital room?: A Little Help needed climbing 3-5 steps with a railing? : A Little 6 Click Score: 21    End of Session Equipment Utilized During Treatment: Oxygen Activity Tolerance: Patient tolerated treatment well Patient left: in chair;with call bell/phone within reach;with chair alarm set Nurse Communication: Mobility status PT Visit Diagnosis: Unsteadiness on feet (R26.81);Muscle weakness (generalized) (M62.81)     Time: 8242-3536 PT Time Calculation (min) (ACUTE ONLY): 38 min  Charges:  $Gait Training: 8-22 mins $Therapeutic Activity: 23-37 mins                     Lieutenant Diego PT, DPT 10:07 AM,01/30/21

## 2021-01-30 NOTE — Plan of Care (Signed)
  Problem: Clinical Measurements: Goal: Ability to maintain clinical measurements within normal limits will improve Outcome: Progressing Goal: Diagnostic test results will improve Outcome: Progressing   Problem: Pain Managment: Goal: General experience of comfort will improve Outcome: Progressing   Problem: Safety: Goal: Ability to remain free from injury will improve Outcome: Progressing   

## 2021-01-30 NOTE — Progress Notes (Signed)
PROGRESS NOTE    [name]   TKP:[id number]  DOB: 11-02-1966  PCP: [facility]    DOA: 01/27/2021 LOS: 2   Brief Narrative   55 year old male with past medical history of ESRD on dialysis MWF, hypertension, hyperlipidemia, COPD, OSA on trilogy at night, CAD, psoriasis presented to the ED on the evening of 01/27/2021 after being found down in his home for about 48 hours. He was found by a friend after patient did not show up for dialysis. On admission, patient reporting some midsternal chest discomfort, and aching pain in both legs and knees.  In the ED, BP uncontrolled 198/88, otherwise normal vitals. Labs remarkable for hypoglycemia initial blood glucose was 33, BUN 56, creatinine 8.74, potassium 4.7. VBG with pH 7.26 and bicarb 26. CK was 3336 and hs-troponin 70. EKG with normal sinus rhythm at a rate of 84 bpm without acute ischemic changes. Chest x-ray showed chronic cardiomegaly and vascular congestion.    Significant Events: --admitted 01/27/21 evening --rapid response for acute onset hypotension & hypoxia in dialysis --> stepdown   Assessment & Plan   Principal Problem:   Volume overload Active Problems:   End stage renal disease (HCC)   Hypoglycemia   Acute on chronic respiratory failure with hypoxia (HCC)   Hypotension   DM type 2 with diabetic peripheral neuropathy (HCC)   Essential hypertension   CAD (coronary artery disease)   Chronic diastolic heart failure (HCC)   Psoriasis   Chronic gout of multiple sites   Hyperlipidemia, unspecified   Obstructive sleep apnea   Morbid obesity ([place])   Obesity hypoventilation syndrome ([place])   Hypoglycemia -present on admission, resolved.  In the setting of being down for 48 hours, no p.o. intake. Treated with D50 twice. Was briefly placed on D5w, stopped given volume overload.  Patient now eating and drinking and sugars improved.  Type 2 diabetes with renal complications - stop glimepiride (would stop it at  d/c, sulfonylureas in dialysis patients increase risk of hypoglycemia).  Takes Tresiba 20 units bedtime.  Hold off basal insulin until sugars stable.  Sliding scale Novolog for now.  Fall of unknown known cause -patient down in his home for 48 hours and found by a friend after not showing up for dialysis. Cause unknown. Patient reports some chest discomfort but has no ischemic changes on EKG and only mild flat troponin elevation. --PT evaluation pending --Telemetry --Referral to cardiology for [place] if telemetry unrevealing here  Rhabdomyolysis -CK over 3000 but in ESRD is not considered significant elevation.  He was treated with normal saline overnight upon admission, stopped for concern of volume overload.     Dialyzed.    Acute on respiratory failure with hypoxia -sudden onset during dialysis 2/12. Appears related to volume overload. Supplemental oxygen to maintain O2 sat 88 to 93% and wean as tolerated.  Hypotension -resolved.  Sudden onset during dialysis today 2/12. Occurred after 1 L had been taken off. Patient was given 500 cc bolus and blood pressure rebounded and is again elevated. Transferred to stepdown for close monitoring.  BPs have since stabilized.  Transfer to [facility] today.  ESRD -on dialysis MWF schedule. Dialysis session 2/12 aborted early due to rapid response outlined above.  Nephrology is following.  GERD -continue PPI  Coronary artery disease -continue aspirin, statin and Imdur.   HFrEF - Echo 09/2019 EF mildly reced at 45-50%, mod LVH.    Gout -does not appear acutely flared but monitor closely. Continue allopurinol  Hyperlipidemia -  continue statin  COPD -without acute exacerbation on admission. Continue home inhaler regimen.  OSA -uses Trilogy NIV at home.  BiPAP for overnight ordered.   Morbid obesity: Body mass index is 45.83 kg/m. Complicates overall care and prognosis. Recommend lifestyle modifications including physical activity and diet aimed at  weight loss. Primary care follow-up.  DVT prophylaxis: heparin injection 5,000 Units Start: 01/27/21 2330   Diet:  Diet Orders (From admission, onward)    Start     Ordered   01/28/21 1505  Diet Carb Modified Fluid consistency: Thin; Room service appropriate? Yes; Fluid restriction: 1200 mL Fluid  Diet effective now       Question Answer Comment  Diet-HS Snack? Nothing   Calorie Level Medium 1600-2000   Fluid consistency: Thin   Room service appropriate? Yes   Fluid restriction: 1200 mL Fluid      01/28/21 1504            Code Status: Full Code    Subjective 01/30/21    Pt seen during dialysis today.  Overall feeling better, just still feels pretty weak.  He did walk with PT, felt okay.  He is concerned about being to weak at home and no help.  Tells me he was in process of moving when he got admitted.  Everything moved out of old place, but new place doesn't have utilities turned on yet.  He is making calls to address this.   Disposition Plan & Communication   Status is: Inpatient  Remains inpatient appropriate because: unsafe to d/c patient home as he does not have power and water, had just moved prior to admission and utilities not yet turned on.  Pt does not have another place to stay. Expect d/c tomorrow.  Dispo: The patient is from: Home              Anticipated d/c is to: Home              Anticipated d/c date is: 1 day              Patient currently is not medically stable to d/c.   Difficult to place patient No  Family Communication: none at bedside, will attempt to call    Consults, Procedures, Significant Events   Consultants:   Nephrology  Procedures:   Dialysis  Antimicrobials:  Anti-infectives (From admission, onward)   None        Objective   Vitals:   01/30/21 1400 01/30/21 1405 01/30/21 1415 01/30/21 1524  BP: (!) 155/76 (!) 155/76 (!) 153/79 (!) 134/96  Pulse: 71 71 75 82  Resp: 15 15 15 20   Temp:    97.8 F (36.6 C)  TempSrc:     Oral  SpO2: 100%  100% 99%  Weight:      Height:        Intake/Output Summary (Last 24 hours) at 01/30/2021 1607 Last data filed at 01/30/2021 1405 Gross per 24 hour  Intake 240 ml  Output 1700 ml  Net -1460 ml   Filed Weights   01/27/21 2043  Weight: 121.1 kg    Physical Exam:  General exam: awake, alert, no acute distress, morbidly obese, in dialysis Respiratory system: symmetric chest rise, normal respiratory effort, on 3 L/min [place] oxygen Cardiovascular system: RRR, BLE edema, 2+radial pulses Central nervous system: A&O x3. CN's grossly intact. Normal speech Skin: diffuse scattered areas of hyperpigmentation due to psoriasis, dry, intact Psychiatry: normal mood, congruent affect, judgement and insight appear normal  Labs   Data Reviewed: I have personally reviewed following labs and imaging studies  CBC: Recent Labs  Lab 01/27/21 2108 01/28/21 0500 01/29/21 0906 01/30/21 0930  WBC 11.2* 12.8* 9.3 6.9  NEUTROABS 8.7*  --  6.1  --   HGB 10.0* 9.6* 9.8* 9.3*  HCT 31.4* 31.7* 32.3* 30.0*  MCV 93.7 94.9 95.6 93.5  PLT 216 205 198 062   Basic Metabolic Panel: Recent Labs  Lab 01/27/21 2108 01/28/21 0500 01/29/21 0906 01/30/21 0930  NA 143 144 139 138  K 4.7 4.8 4.4 3.4*  CL 105 105 100 100  CO2 23 27 24 26   GLUCOSE 123* 92 120* 129*  BUN 56* 62* 58* 46*  CREATININE 8.74* 9.14* 9.13* 6.39*  CALCIUM 9.0 8.7* 8.9 8.5*  PHOS  --   --   --  4.4   GFR: Estimated Creatinine Clearance: 16.1 mL/min (A) (by C-G formula based on SCr of 6.39 mg/dL (H)). Liver Function Tests: Recent Labs  Lab 01/27/21 2108 01/30/21 0930  AST 67*  --   ALT 26  --   ALKPHOS 150*  --   BILITOT 1.1  --   PROT 8.5*  --   ALBUMIN 3.9 3.4*   No results for input(s): LIPASE, AMYLASE in the last 168 hours. No results for input(s): AMMONIA in the last 168 hours. Coagulation Profile: No results for input(s): INR, PROTIME in the last 168 hours. Cardiac Enzymes: Recent Labs  Lab  01/27/21 2108 01/28/21 0124 01/28/21 0526 01/28/21 1402  CKTOTAL 3,336* 3,471* 3,382* 3,317*  CKMB  --   --   --  4.0   BNP (last 3 results) No results for input(s): PROBNP in the last 8760 hours. HbA1C: Recent Labs    01/28/21 0124  HGBA1C 5.6   CBG: Recent Labs  Lab 01/29/21 1125 01/29/21 1651 01/29/21 2127 01/30/21 0815 01/30/21 1523  GLUCAP 139* 163* 168* 100* 87   Lipid Profile: No results for input(s): CHOL, HDL, LDLCALC, TRIG, CHOLHDL, LDLDIRECT in the last 72 hours. Thyroid Function Tests: No results for input(s): TSH, T4TOTAL, FREET4, T3FREE, THYROIDAB in the last 72 hours. Anemia Panel: No results for input(s): VITAMINB12, FOLATE, FERRITIN, TIBC, IRON, RETICCTPCT in the last 72 hours. Sepsis Labs: Recent Labs  Lab 01/27/21 2108 01/28/21 1521 01/28/21 1835  LATICACIDVEN 1.3 1.4 1.1    Recent Results (from the past 240 hour(s))  Resp Panel by RT-PCR (Flu A&B, Covid) Nasopharyngeal Swab     Status: None   Collection Time: 01/27/21  9:06 PM   Specimen: Nasopharyngeal Swab; Nasopharyngeal(NP) swabs in vial transport medium  Result Value Ref Range Status   SARS Coronavirus 2 by RT PCR NEGATIVE NEGATIVE Final    Comment: (NOTE) SARS-CoV-2 target nucleic acids are NOT DETECTED.  The SARS-CoV-2 RNA is generally detectable in upper respiratory specimens during the acute phase of infection. The lowest concentration of SARS-CoV-2 viral copies this assay can detect is 138 copies/mL. A negative result does not preclude SARS-Cov-2 infection and should not be used as the sole basis for treatment or other patient management decisions. A negative result may occur with  improper specimen collection/handling, submission of specimen other than nasopharyngeal swab, presence of viral mutation(s) within the areas targeted by this assay, and inadequate number of viral copies(<138 copies/mL). A negative result must be combined with clinical observations, patient history,  and epidemiological information. The expected result is Negative.  Fact Sheet for Patients:  EntrepreneurPulse.com.au  Fact Sheet for Healthcare Providers:  IncredibleEmployment.be  This  test is no t yet approved or cleared by the [place] and  has been authorized for detection and/or diagnosis of SARS-CoV-2 by FDA under an Emergency Use Authorization (EUA). This EUA will remain  in effect (meaning this test can be used) for the duration of the COVID-19 declaration under Section 564(b)(1) of the Act, 21 U.S.C.section 360bbb-3(b)(1), unless the authorization is terminated  or revoked sooner.       Influenza A by PCR NEGATIVE NEGATIVE Final   Influenza B by PCR NEGATIVE NEGATIVE Final    Comment: (NOTE) The Xpert Xpress SARS-CoV-2/FLU/RSV plus assay is intended as an aid in the diagnosis of influenza from Nasopharyngeal swab specimens and should not be used as a sole basis for treatment. Nasal washings and aspirates are unacceptable for Xpert Xpress SARS-CoV-2/FLU/RSV testing.  Fact Sheet for Patients: EntrepreneurPulse.com.au  Fact Sheet for Healthcare Providers: IncredibleEmployment.be  This test is not yet approved or cleared by the [place] FDA and has been authorized for detection and/or diagnosis of SARS-CoV-2 by FDA under an Emergency Use Authorization (EUA). This EUA will remain in effect (meaning this test can be used) for the duration of the COVID-19 declaration under Section 564(b)(1) of the Act, 21 U.S.C. section 360bbb-3(b)(1), unless the authorization is terminated or revoked.  Performed at [facility], [address] [place], [place] [address]   MRSA PCR Screening     Status: None   Collection Time: 01/28/21  2:44 PM   Specimen: Nasal Mucosa; Nasopharyngeal  Result Value Ref Range Status   MRSA by PCR NEGATIVE NEGATIVE Final    Comment:        The GeneXpert  MRSA Assay (FDA approved for NASAL specimens only), is one component of a comprehensive MRSA colonization surveillance program. It is not intended to diagnose MRSA infection nor to guide or monitor treatment for MRSA infections. Performed at [facility], [address] [place], [place] [address]       Imaging Studies   ECHOCARDIOGRAM COMPLETE  Result Date: 01/29/2021    ECHOCARDIOGRAM REPORT   Patient Name:   Saadiq ELI PATTILLO Date of Exam: 01/29/2021 Medical Rec #:  [id number]          Height:       64.0 in Accession #:    [id number]         Weight:       267.0 lb Date of Birth:  10-09-1966          BSA:          2.212 m Patient Age:    38 years           BP:           142/81 mmHg Patient Gender: M                  HR:           81 bpm. Exam Location:  [facility] Procedure: 2D Echo Indications:     Systolic CHF  History:         Patient has prior history of Echocardiogram examinations. CAD,                  Pulmonary HTN and COPD; Risk Factors:Hypertension, Diabetes and                  Morbid Obesity.  Sonographer:     Wallace Keller Thornton-Maynard Referring Phys:  [id number] IWPYK A Mickeal Daws Diagnosing Phys: Jenkins Rouge MD  Sonographer  Comments: Suboptimal subcostal window. Image acquisition challenging due to patient body habitus. IMPRESSIONS  1. Left ventricular ejection fraction, by estimation, is 55 to 60%. The left ventricle has normal function. The left ventricle has no regional wall motion abnormalities. There is moderate left ventricular hypertrophy. Left ventricular diastolic parameters are consistent with Grade II diastolic dysfunction (pseudonormalization). Elevated left ventricular end-diastolic pressure.  2. Right ventricular systolic function is normal. The right ventricular size is normal.  3. The mitral valve is normal in structure. No evidence of mitral valve regurgitation. No evidence of mitral stenosis.  4. The aortic valve was not well visualized. Aortic valve regurgitation  is not visualized. Mild to moderate aortic valve sclerosis/calcification is present, without any evidence of aortic stenosis.  5. The inferior vena cava is normal in size with greater than 50% respiratory variability, suggesting right atrial pressure of 3 mmHg. FINDINGS  Left Ventricle: Left ventricular ejection fraction, by estimation, is 55 to 60%. The left ventricle has normal function. The left ventricle has no regional wall motion abnormalities. The left ventricular internal cavity size was normal in size. There is  moderate left ventricular hypertrophy. Left ventricular diastolic parameters are consistent with Grade II diastolic dysfunction (pseudonormalization). Elevated left ventricular end-diastolic pressure. Right Ventricle: The right ventricular size is normal. No increase in right ventricular wall thickness. Right ventricular systolic function is normal. Left Atrium: Left atrial size was normal in size. Right Atrium: Right atrial size was not well visualized. Pericardium: There is no evidence of pericardial effusion. Mitral Valve: The mitral valve is normal in structure. There is mild thickening of the mitral valve leaflet(s). There is mild calcification of the mitral valve leaflet(s). Mild mitral annular calcification. No evidence of mitral valve regurgitation. No evidence of mitral valve stenosis. Tricuspid Valve: The tricuspid valve is normal in structure. Tricuspid valve regurgitation is mild . No evidence of tricuspid stenosis. Aortic Valve: The aortic valve was not well visualized. Aortic valve regurgitation is not visualized. Mild to moderate aortic valve sclerosis/calcification is present, without any evidence of aortic stenosis. Aortic valve mean gradient measures 4.0 mmHg.  Aortic valve peak gradient measures 7.6 mmHg. Aortic valve area, by VTI measures 2.31 cm. Pulmonic Valve: The pulmonic valve was normal in structure. Pulmonic valve regurgitation is not visualized. No evidence of pulmonic  stenosis. Aorta: The aortic root is normal in size and structure. Venous: The inferior vena cava is normal in size with greater than 50% respiratory variability, suggesting right atrial pressure of 3 mmHg. IAS/Shunts: No atrial level shunt detected by color flow Doppler.  LEFT VENTRICLE PLAX 2D LVIDd:         4.95 cm  Diastology LVIDs:         3.15 cm  LV e' medial:    6.85 cm/s LV PW:         1.58 cm  LV E/e' medial:  15.5 LV IVS:        1.85 cm  LV e' lateral:   6.53 cm/s LVOT diam:     2.40 cm  LV E/e' lateral: 16.2 LV SV:         54 LV SV Index:   25 LVOT Area:     4.52 cm  LEFT ATRIUM              Index LA diam:        2.80 cm  1.27 cm/m LA Vol (A2C):   113.0 ml 51.09 ml/m LA Vol (A4C):   74.1 ml  33.50  ml/m LA Biplane Vol: 98.1 ml  44.35 ml/m  AORTIC VALVE                   PULMONIC VALVE AV Area (Vmax):    2.18 cm    PV Vmax:       1.06 m/s AV Area (Vmean):   2.18 cm    PV Peak grad:  4.5 mmHg AV Area (VTI):     2.31 cm AV Vmax:           138.00 cm/s AV Vmean:          95.400 cm/s AV VTI:            0.235 m AV Peak Grad:      7.6 mmHg AV Mean Grad:      4.0 mmHg LVOT Vmax:         66.60 cm/s LVOT Vmean:        45.900 cm/s LVOT VTI:          0.120 m LVOT/AV VTI ratio: 0.51  AORTA Ao Root diam: 3.50 cm MITRAL VALVE MV Area (PHT): 4.00 cm     SHUNTS MV E velocity: 106.00 cm/s  Systemic VTI:  0.12 m MV A velocity: 72.80 cm/s   Systemic Diam: 2.40 cm MV E/A ratio:  1.46 Jenkins Rouge MD Electronically signed by Jenkins Rouge MD Signature Date/Time: 01/29/2021/10:30:06 AM    Final      Medications   Scheduled Meds: . allopurinol  100 mg Oral Daily  . aspirin EC  81 mg Oral Daily  . calcipotriene   Topical UD  . Chlorhexidine Gluconate Cloth  6 each Topical Q0600  . cholecalciferol  2,000 Units Oral Daily  . diclofenac Sodium  4 g Topical QID  . [START ON 02/01/2021] epoetin (EPOGEN/PROCRIT) injection  4,000 Units Intravenous Q M,W,F-HD  . gabapentin  300 mg Oral QHS  . heparin  5,000 Units  Subcutaneous Q8H  . insulin aspart  0-9 Units Subcutaneous TID PC & HS  . latanoprost  1 drop Both Eyes QHS  . magnesium oxide  200 mg Oral Daily  . mouth rinse  15 mL Mouth Rinse q12n4p  . multivitamin  1 tablet Oral Daily  . pantoprazole  40 mg Oral Daily  . umeclidinium-vilanterol  1 puff Inhalation Daily   Continuous Infusions:     LOS: 2 days    Time spent: 25 minutes with > 50% spent at bedside and in coordination of care.    Ezekiel Slocumb, DO [facility]  01/30/2021, 4:07 PM      If 7PM-7AM, please contact night-coverage. How to contact the [facility] Attending or Consulting provider [place] or covering provider during after hours [place], for this patient?    1. Check the care team in [facility] and look for a) attending/consulting [facility] provider listed and b) the [facility] team listed 2. Log into [email or web address] and use [place]'s universal password to access. If you do not have the password, please contact the hospital operator. 3. Locate the [facility] provider you are looking for under [facility] and page to a number that you can be directly reached. 4. If you still have difficulty reaching the provider, please page the [facility] (Director on Call) for the Hospitalists listed on amion for assistance.

## 2021-01-30 NOTE — Progress Notes (Signed)
Progress Note  Patient Name: [name] Date of Encounter: 01/30/2021  [facility] Cardiologist: Kate Sable, MD   Subjective   He denies chest pain or shortness of breath.  He reports being fatigued.  He is due for dialysis today.   Inpatient Medications    Scheduled Meds: . allopurinol  100 mg Oral Daily  . aspirin EC  81 mg Oral Daily  . calcipotriene   Topical UD  . Chlorhexidine Gluconate Cloth  6 each Topical Q0600  . cholecalciferol  2,000 Units Oral Daily  . diclofenac Sodium  4 g Topical QID  . gabapentin  300 mg Oral QHS  . heparin  5,000 Units Subcutaneous Q8H  . insulin aspart  0-9 Units Subcutaneous TID PC & HS  . latanoprost  1 drop Both Eyes QHS  . magnesium oxide  200 mg Oral Daily  . mouth rinse  15 mL Mouth Rinse q12n4p  . multivitamin  1 tablet Oral Daily  . pantoprazole  40 mg Oral Daily  . umeclidinium-vilanterol  1 puff Inhalation Daily   Continuous Infusions:  PRN Meds: acetaminophen **OR** acetaminophen, albuterol, cyclobenzaprine, labetalol, lidocaine-prilocaine, magnesium hydroxide, nitroGLYCERIN, ondansetron **OR** ondansetron (ZOFRAN) IV, polyethylene glycol powder, traMADol, traZODone **OR** traZODone   Vital Signs    Vitals:   01/29/21 2323 01/30/21 0157 01/30/21 0403 01/30/21 0730  BP: 127/72  138/76 (!) 144/78  Pulse: 78 73 78 73  Resp: 14  14 15   Temp: 99 F (37.2 C)  98 F (36.7 C) 98.8 F (37.1 C)  TempSrc:   Oral Oral  SpO2: 94% 99% 99% 94%  Weight:      Height:        Intake/Output Summary (Last 24 hours) at 01/30/2021 0847 Last data filed at 01/29/2021 2135 Gross per 24 hour  Intake -  Output 200 ml  Net -200 ml   Last 3 Weights 01/27/2021 12/29/2020 09/08/2020  Weight (lbs) 266 lb 15.6 oz 270 lb 270 lb 12.8 oz  Weight (kg) 121.1 kg 122.471 kg 122.834 kg      Telemetry    Sinus rhythm with no significant arrhythmia- Personally Reviewed  ECG     - Personally Reviewed  Physical Exam   GEN: No  acute distress.   Neck: No JVD Cardiac: RRR, no  rubs, or gallops. 1 out of 6 systolic murmur at the base.  Right arm fistula.  Mild bilateral leg edema with chronic stasis dermatitis. Respiratory: Clear to auscultation bilaterally. GI: Soft, nontender, non-distended  MS: No edema; No deformity. Neuro:  Nonfocal  Psych: Normal affect   Labs    High Sensitivity Troponin:   Recent Labs  Lab 01/28/21 0124 01/28/21 0500 01/28/21 1402 01/28/21 1627 01/29/21 0624  TROPONINIHS 68* 86* 70* 66* 63*      Chemistry Recent Labs  Lab 01/27/21 2108 01/28/21 0500 01/29/21 0906  NA 143 144 139  K 4.7 4.8 4.4  CL 105 105 100  CO2 23 27 24   GLUCOSE 123* 92 120*  BUN 56* 62* 58*  CREATININE 8.74* 9.14* 9.13*  CALCIUM 9.0 8.7* 8.9  PROT 8.5*  --   --   ALBUMIN 3.9  --   --   AST 67*  --   --   ALT 26  --   --   ALKPHOS 150*  --   --   BILITOT 1.1  --   --   GFRNONAA 7* 6* 6*  ANIONGAP 15 12 15      Hematology  Recent Labs  Lab 01/27/21 2108 01/28/21 0500 01/29/21 0906  WBC 11.2* 12.8* 9.3  RBC 3.35* 3.34* 3.38*  HGB 10.0* 9.6* 9.8*  HCT 31.4* 31.7* 32.3*  MCV 93.7 94.9 95.6  MCH 29.9 28.7 29.0  MCHC 31.8 30.3 30.3  RDW 15.8* 15.5 15.3  PLT 216 205 198    BNPNo results for input(s): BNP, PROBNP in the last 168 hours.   DDimer No results for input(s): DDIMER in the last 168 hours.   Radiology    ECHOCARDIOGRAM COMPLETE  Result Date: 01/29/2021    ECHOCARDIOGRAM REPORT   Patient Name:   [name] Date of Exam: 01/29/2021 Medical Rec #:  [id number]          Height:       64.0 in Accession #:    [id number]         Weight:       267.0 lb Date of Birth:  1966/11/12          BSA:          2.212 m Patient Age:    55 years           BP:           142/81 mmHg Patient Gender: M                  HR:           81 bpm. Exam Location:  [facility] Procedure: 2D Echo Indications:     Systolic CHF  History:         Patient has prior history of Echocardiogram examinations. CAD,                   Pulmonary HTN and COPD; Risk Factors:Hypertension, Diabetes and                  Morbid Obesity.  Sonographer:     Wallace Keller Thornton-Maynard Referring Phys:  [id number] Claiborne Billings A GRIFFITH Diagnosing Phys: Jenkins Rouge MD  Sonographer Comments: Suboptimal subcostal window. Image acquisition challenging due to patient body habitus. IMPRESSIONS  1. Left ventricular ejection fraction, by estimation, is 55 to 60%. The left ventricle has normal function. The left ventricle has no regional wall motion abnormalities. There is moderate left ventricular hypertrophy. Left ventricular diastolic parameters are consistent with Grade II diastolic dysfunction (pseudonormalization). Elevated left ventricular end-diastolic pressure.  2. Right ventricular systolic function is normal. The right ventricular size is normal.  3. The mitral valve is normal in structure. No evidence of mitral valve regurgitation. No evidence of mitral stenosis.  4. The aortic valve was not well visualized. Aortic valve regurgitation is not visualized. Mild to moderate aortic valve sclerosis/calcification is present, without any evidence of aortic stenosis.  5. The inferior vena cava is normal in size with greater than 50% respiratory variability, suggesting right atrial pressure of 3 mmHg. FINDINGS  Left Ventricle: Left ventricular ejection fraction, by estimation, is 55 to 60%. The left ventricle has normal function. The left ventricle has no regional wall motion abnormalities. The left ventricular internal cavity size was normal in size. There is  moderate left ventricular hypertrophy. Left ventricular diastolic parameters are consistent with Grade II diastolic dysfunction (pseudonormalization). Elevated left ventricular end-diastolic pressure. Right Ventricle: The right ventricular size is normal. No increase in right ventricular wall thickness. Right ventricular systolic function is normal. Left Atrium: Left atrial size was normal in size. Right  Atrium: Right atrial size was not well visualized. Pericardium: There  is no evidence of pericardial effusion. Mitral Valve: The mitral valve is normal in structure. There is mild thickening of the mitral valve leaflet(s). There is mild calcification of the mitral valve leaflet(s). Mild mitral annular calcification. No evidence of mitral valve regurgitation. No evidence of mitral valve stenosis. Tricuspid Valve: The tricuspid valve is normal in structure. Tricuspid valve regurgitation is mild . No evidence of tricuspid stenosis. Aortic Valve: The aortic valve was not well visualized. Aortic valve regurgitation is not visualized. Mild to moderate aortic valve sclerosis/calcification is present, without any evidence of aortic stenosis. Aortic valve mean gradient measures 4.0 mmHg.  Aortic valve peak gradient measures 7.6 mmHg. Aortic valve area, by VTI measures 2.31 cm. Pulmonic Valve: The pulmonic valve was normal in structure. Pulmonic valve regurgitation is not visualized. No evidence of pulmonic stenosis. Aorta: The aortic root is normal in size and structure. Venous: The inferior vena cava is normal in size with greater than 50% respiratory variability, suggesting right atrial pressure of 3 mmHg. IAS/Shunts: No atrial level shunt detected by color flow Doppler.  LEFT VENTRICLE PLAX 2D LVIDd:         4.95 cm  Diastology LVIDs:         3.15 cm  LV e' medial:    6.85 cm/s LV PW:         1.58 cm  LV E/e' medial:  15.5 LV IVS:        1.85 cm  LV e' lateral:   6.53 cm/s LVOT diam:     2.40 cm  LV E/e' lateral: 16.2 LV SV:         54 LV SV Index:   25 LVOT Area:     4.52 cm  LEFT ATRIUM              Index LA diam:        2.80 cm  1.27 cm/m LA Vol (A2C):   113.0 ml 51.09 ml/m LA Vol (A4C):   74.1 ml  33.50 ml/m LA Biplane Vol: 98.1 ml  44.35 ml/m  AORTIC VALVE                   PULMONIC VALVE AV Area (Vmax):    2.18 cm    PV Vmax:       1.06 m/s AV Area (Vmean):   2.18 cm    PV Peak grad:  4.5 mmHg AV Area (VTI):      2.31 cm AV Vmax:           138.00 cm/s AV Vmean:          95.400 cm/s AV VTI:            0.235 m AV Peak Grad:      7.6 mmHg AV Mean Grad:      4.0 mmHg LVOT Vmax:         66.60 cm/s LVOT Vmean:        45.900 cm/s LVOT VTI:          0.120 m LVOT/AV VTI ratio: 0.51  AORTA Ao Root diam: 3.50 cm MITRAL VALVE MV Area (PHT): 4.00 cm     SHUNTS MV E velocity: 106.00 cm/s  Systemic VTI:  0.12 m MV A velocity: 72.80 cm/s   Systemic Diam: 2.40 cm MV E/A ratio:  1.46 Jenkins Rouge MD Electronically signed by Jenkins Rouge MD Signature Date/Time: 01/29/2021/10:30:06 AM    Final     Cardiac Studies   Echocardiogram done yesterday  showed normal LV systolic function with no significant valvular abnormalities.  Patient Profile     55 y.o. male male with a hx of CAD status post remote PCI to the RCA, chronic combined systolic and diastolic CHF, pulmonary hypertension, ESRD on HD, DM2, HTN, HLD, morbid obesity with obesity hypoventilation syndrome, OSA on BiPAP, gout, and GERD who was seen for hypertension during dialysis.    Assessment & Plan    1.  Mildly elevated troponin likely due to supply demand ischemia.  No evidence of acute coronary syndrome.  Echocardiogram showed normal LV systolic function and wall motion.  No plans for further ischemic evaluation at the present time.  2.  Hypotension during dialysis: Resolved.  He is due for dialysis today.  If he has recurrent hypotension during dialysis, can consider using midodrine as needed.  He is currently not on any antihypertensive medications.  3.  Coronary artery disease: Seems to be stable overall.  Continue aspirin and resume atorvastatin.  If blood pressure starts increasing again, recommend resuming carvedilol and Imdur.      For questions or updates, please contact [place] Please consult [email or web address] for contact info under        Signed, Kathlyn Sacramento, MD  01/30/2021, 8:47 AM

## 2021-01-30 NOTE — Progress Notes (Signed)
[facility]  ROUNDING NOTE   Subjective:   Patient seen during dialysis this morning  HEMODIALYSIS FLOWSHEET:  Blood Flow Rate (mL/min): 400 mL/min Arterial Pressure (mmHg): -150 mmHg Venous Pressure (mmHg): 220 mmHg Transmembrane Pressure (mmHg): 80 mmHg Ultrafiltration Rate (mL/min): 670 mL/min Dialysate Flow Rate (mL/min): 600 ml/min Conductivity: Machine : 14.1 Conductivity: Machine : 14.1 Dialysis Fluid Bolus: Normal Saline Bolus Amount (mL): 200 mL  Patient currently resting in bed during treatment. Alert and oriented Complains of discomfort with initial dialysis setup He is requesting numbing cream. Currently on 2 L [place] O2.  States he is able to eat without nausea and vomiting    Objective:  Vital signs in last 24 hours:  Temp:  [98 F (36.7 C)-99 F (37.2 C)] 98.8 F (37.1 C) (02/14 0730) Pulse Rate:  [68-83] 75 (02/14 1415) Resp:  [10-23] 15 (02/14 1415) BP: (123-163)/(72-100) 153/79 (02/14 1415) SpO2:  [94 %-100 %] 100 % (02/14 1415)  Weight change:  Filed Weights   01/27/21 2043  Weight: 121.1 kg    Intake/Output: I/O last 3 completed shifts: In: 200 [P.O.:200] Out: 200 [Urine:200]   Intake/Output this shift:  Total I/O In: 240 [P.O.:240] Out: 1500 [Other:1500]  Physical Exam: General: NAD, laying in bed  Head: Normocephalic, atraumatic. Moist oral mucosal membranes  Eyes: Anicteric  Neck: Supple, trachea midline  Lungs:  Diminished bilateral  Heart: Regular rate and rhythm  Abdomen:  Soft, nontender,   Extremities:  + peripheral edema.  Neurologic: Nonfocal, moving all four extremities  Skin: No lesions  Access: Left AVF    Basic Metabolic Panel: Recent Labs  Lab 01/27/21 2108 01/28/21 0500 01/29/21 0906 01/30/21 0930  NA 143 144 139 138  K 4.7 4.8 4.4 3.4*  CL 105 105 100 100  CO2 23 27 24 26   GLUCOSE 123* 92 120* 129*  BUN 56* 62* 58* 46*  CREATININE 8.74* 9.14* 9.13* 6.39*  CALCIUM 9.0 8.7* 8.9 8.5*  PHOS   --   --   --  4.4    Liver Function Tests: Recent Labs  Lab 01/27/21 2108 01/30/21 0930  AST 67*  --   ALT 26  --   ALKPHOS 150*  --   BILITOT 1.1  --   PROT 8.5*  --   ALBUMIN 3.9 3.4*   No results for input(s): LIPASE, AMYLASE in the last 168 hours. No results for input(s): AMMONIA in the last 168 hours.  CBC: Recent Labs  Lab 01/27/21 2108 01/28/21 0500 01/29/21 0906 01/30/21 0930  WBC 11.2* 12.8* 9.3 6.9  NEUTROABS 8.7*  --  6.1  --   HGB 10.0* 9.6* 9.8* 9.3*  HCT 31.4* 31.7* 32.3* 30.0*  MCV 93.7 94.9 95.6 93.5  PLT 216 205 198 179    Cardiac Enzymes: Recent Labs  Lab 01/27/21 2108 01/28/21 0124 01/28/21 0526 01/28/21 1402  CKTOTAL 3,336* 3,471* 3,382* 3,317*  CKMB  --   --   --  4.0    BNP: Invalid input(s): POCBNP  CBG: Recent Labs  Lab 01/29/21 0729 01/29/21 1125 01/29/21 1651 01/29/21 2127 01/30/21 0815  GLUCAP 94 139* 163* 168* 100*    Microbiology: Results for orders placed or performed during the hospital encounter of 01/27/21  Resp Panel by RT-PCR (Flu A&B, Covid) Nasopharyngeal Swab     Status: None   Collection Time: 01/27/21  9:06 PM   Specimen: Nasopharyngeal Swab; Nasopharyngeal(NP) swabs in vial transport medium  Result Value Ref Range Status   SARS Coronavirus  2 by RT PCR NEGATIVE NEGATIVE Final    Comment: (NOTE) SARS-CoV-2 target nucleic acids are NOT DETECTED.  The SARS-CoV-2 RNA is generally detectable in upper respiratory specimens during the acute phase of infection. The lowest concentration of SARS-CoV-2 viral copies this assay can detect is 138 copies/mL. A negative result does not preclude SARS-Cov-2 infection and should not be used as the sole basis for treatment or other patient management decisions. A negative result may occur with  improper specimen collection/handling, submission of specimen other than nasopharyngeal swab, presence of viral mutation(s) within the areas targeted by this assay, and inadequate  number of viral copies(<138 copies/mL). A negative result must be combined with clinical observations, patient history, and epidemiological information. The expected result is Negative.  Fact Sheet for Patients:  EntrepreneurPulse.com.au  Fact Sheet for Healthcare Providers:  IncredibleEmployment.be  This test is no t yet approved or cleared by the [place] FDA and  has been authorized for detection and/or diagnosis of SARS-CoV-2 by FDA under an Emergency Use Authorization (EUA). This EUA will remain  in effect (meaning this test can be used) for the duration of the COVID-19 declaration under Section 564(b)(1) of the Act, 21 U.S.C.section 360bbb-3(b)(1), unless the authorization is terminated  or revoked sooner.       Influenza A by PCR NEGATIVE NEGATIVE Final   Influenza B by PCR NEGATIVE NEGATIVE Final    Comment: (NOTE) The Xpert Xpress SARS-CoV-2/FLU/RSV plus assay is intended as an aid in the diagnosis of influenza from Nasopharyngeal swab specimens and should not be used as a sole basis for treatment. Nasal washings and aspirates are unacceptable for Xpert Xpress SARS-CoV-2/FLU/RSV testing.  Fact Sheet for Patients: EntrepreneurPulse.com.au  Fact Sheet for Healthcare Providers: IncredibleEmployment.be  This test is not yet approved or cleared by the [place] FDA and has been authorized for detection and/or diagnosis of SARS-CoV-2 by FDA under an Emergency Use Authorization (EUA). This EUA will remain in effect (meaning this test can be used) for the duration of the COVID-19 declaration under Section 564(b)(1) of the Act, 21 U.S.C. section 360bbb-3(b)(1), unless the authorization is terminated or revoked.  Performed at [facility], [address] [place], [place] [address]   MRSA PCR Screening     Status: None   Collection Time: 01/28/21  2:44 PM   Specimen: Nasal Mucosa;  Nasopharyngeal  Result Value Ref Range Status   MRSA by PCR NEGATIVE NEGATIVE Final    Comment:        The GeneXpert MRSA Assay (FDA approved for NASAL specimens only), is one component of a comprehensive MRSA colonization surveillance program. It is not intended to diagnose MRSA infection nor to guide or monitor treatment for MRSA infections. Performed at [facility], [address] [place], [place] [address]     Coagulation Studies: No results for input(s): LABPROT, INR in the last 72 hours.  Urinalysis: No results for input(s): COLORURINE, LABSPEC, PHURINE, GLUCOSEU, HGBUR, BILIRUBINUR, KETONESUR, PROTEINUR, UROBILINOGEN, NITRITE, LEUKOCYTESUR in the last 72 hours.  Invalid input(s): APPERANCEUR    Imaging: ECHOCARDIOGRAM COMPLETE  Result Date: 01/29/2021    ECHOCARDIOGRAM REPORT   Patient Name:   [name] Date of Exam: 01/29/2021 Medical Rec #:  [id number]          Height:       64.0 in Accession #:    [id number]         Weight:       267.0 lb Date of Birth:  [birth year]          BSA:          2.212 m Patient Age:    22 years           BP:           142/81 mmHg Patient Gender: M                  HR:           81 bpm. Exam Location:  [facility] Procedure: 2D Echo Indications:     Systolic CHF  History:         Patient has prior history of Echocardiogram examinations. CAD,                  Pulmonary HTN and COPD; Risk Factors:Hypertension, Diabetes and                  Morbid Obesity.  Sonographer:     Wallace Keller Thornton-Maynard Referring Phys:  [id number] Claiborne Billings A GRIFFITH Diagnosing Phys: Jenkins Rouge MD  Sonographer Comments: Suboptimal subcostal window. Image acquisition challenging due to patient body habitus. IMPRESSIONS  1. Left ventricular ejection fraction, by estimation, is 55 to 60%. The left ventricle has normal function. The left ventricle has no regional wall motion abnormalities. There is moderate left ventricular hypertrophy. Left ventricular diastolic  parameters are consistent with Grade II diastolic dysfunction (pseudonormalization). Elevated left ventricular end-diastolic pressure.  2. Right ventricular systolic function is normal. The right ventricular size is normal.  3. The mitral valve is normal in structure. No evidence of mitral valve regurgitation. No evidence of mitral stenosis.  4. The aortic valve was not well visualized. Aortic valve regurgitation is not visualized. Mild to moderate aortic valve sclerosis/calcification is present, without any evidence of aortic stenosis.  5. The inferior vena cava is normal in size with greater than 50% respiratory variability, suggesting right atrial pressure of 3 mmHg. FINDINGS  Left Ventricle: Left ventricular ejection fraction, by estimation, is 55 to 60%. The left ventricle has normal function. The left ventricle has no regional wall motion abnormalities. The left ventricular internal cavity size was normal in size. There is  moderate left ventricular hypertrophy. Left ventricular diastolic parameters are consistent with Grade II diastolic dysfunction (pseudonormalization). Elevated left ventricular end-diastolic pressure. Right Ventricle: The right ventricular size is normal. No increase in right ventricular wall thickness. Right ventricular systolic function is normal. Left Atrium: Left atrial size was normal in size. Right Atrium: Right atrial size was not well visualized. Pericardium: There is no evidence of pericardial effusion. Mitral Valve: The mitral valve is normal in structure. There is mild thickening of the mitral valve leaflet(s). There is mild calcification of the mitral valve leaflet(s). Mild mitral annular calcification. No evidence of mitral valve regurgitation. No evidence of mitral valve stenosis. Tricuspid Valve: The tricuspid valve is normal in structure. Tricuspid valve regurgitation is mild . No evidence of tricuspid stenosis. Aortic Valve: The aortic valve was not well visualized. Aortic  valve regurgitation is not visualized. Mild to moderate aortic valve sclerosis/calcification is present, without any evidence of aortic stenosis. Aortic valve mean gradient measures 4.0 mmHg.  Aortic valve peak gradient measures 7.6 mmHg. Aortic valve area, by VTI measures 2.31 cm. Pulmonic Valve: The pulmonic valve was normal in structure. Pulmonic valve regurgitation is not visualized. No evidence of pulmonic stenosis. Aorta: The aortic root is normal in size and structure. Venous: The inferior vena cava is normal in size with greater  than 50% respiratory variability, suggesting right atrial pressure of 3 mmHg. IAS/Shunts: No atrial level shunt detected by color flow Doppler.  LEFT VENTRICLE PLAX 2D LVIDd:         4.95 cm  Diastology LVIDs:         3.15 cm  LV e' medial:    6.85 cm/s LV PW:         1.58 cm  LV E/e' medial:  15.5 LV IVS:        1.85 cm  LV e' lateral:   6.53 cm/s LVOT diam:     2.40 cm  LV E/e' lateral: 16.2 LV SV:         54 LV SV Index:   25 LVOT Area:     4.52 cm  LEFT ATRIUM              Index LA diam:        2.80 cm  1.27 cm/m LA Vol (A2C):   113.0 ml 51.09 ml/m LA Vol (A4C):   74.1 ml  33.50 ml/m LA Biplane Vol: 98.1 ml  44.35 ml/m  AORTIC VALVE                   PULMONIC VALVE AV Area (Vmax):    2.18 cm    PV Vmax:       1.06 m/s AV Area (Vmean):   2.18 cm    PV Peak grad:  4.5 mmHg AV Area (VTI):     2.31 cm AV Vmax:           138.00 cm/s AV Vmean:          95.400 cm/s AV VTI:            0.235 m AV Peak Grad:      7.6 mmHg AV Mean Grad:      4.0 mmHg LVOT Vmax:         66.60 cm/s LVOT Vmean:        45.900 cm/s LVOT VTI:          0.120 m LVOT/AV VTI ratio: 0.51  AORTA Ao Root diam: 3.50 cm MITRAL VALVE MV Area (PHT): 4.00 cm     SHUNTS MV E velocity: 106.00 cm/s  Systemic VTI:  0.12 m MV A velocity: 72.80 cm/s   Systemic Diam: 2.40 cm MV E/A ratio:  1.46 Jenkins Rouge MD Electronically signed by Jenkins Rouge MD Signature Date/Time: 01/29/2021/10:30:06 AM    Final       Medications:    . allopurinol  100 mg Oral Daily  . aspirin EC  81 mg Oral Daily  . calcipotriene   Topical UD  . Chlorhexidine Gluconate Cloth  6 each Topical Q0600  . cholecalciferol  2,000 Units Oral Daily  . diclofenac Sodium  4 g Topical QID  . gabapentin  300 mg Oral QHS  . heparin  5,000 Units Subcutaneous Q8H  . insulin aspart  0-9 Units Subcutaneous TID PC & HS  . latanoprost  1 drop Both Eyes QHS  . magnesium oxide  200 mg Oral Daily  . mouth rinse  15 mL Mouth Rinse q12n4p  . multivitamin  1 tablet Oral Daily  . pantoprazole  40 mg Oral Daily  . umeclidinium-vilanterol  1 puff Inhalation Daily   acetaminophen **OR** acetaminophen, albuterol, cyclobenzaprine, labetalol, lidocaine-prilocaine, magnesium hydroxide, nitroGLYCERIN, ondansetron **OR** ondansetron (ZOFRAN) IV, polyethylene glycol powder, traMADol, traZODone **OR** traZODone  Assessment/ Plan:  [name] is a  55 y.o. black male with end stage renal disease, sleep apnea, hypertension, diabetes mellitus type II insulin dependent, pulmonary hypertension, psoriasis, gout, hyperlipidemia, COPD, congestive heart failure who is admitted to [facility] on 01/27/2021 for Hypoglycemia [E16.2] ESRD (end stage renal disease) ([place]) [N18.6] Fall, initial encounter [W19.XXXA] Traumatic rhabdomyolysis, initial encounter ([place]) [T79.6XXA]  [facility] MWF [place] Left AVF 121kg.   1. End Stage renal disease:  Currently receiving HD Maintaining BP and oxygen levels during treatment Will order numbing spray for dialysis initiation. Next treatment will be scheduled for Wednesday  2. Hypertension:  BP continues to be elevated during treatment Home regimen of carvedilol, furosemide, metolazone, hydralazine, isosorbide mononitrate. Echo results- diastolic dysfunction  3. Anemia of chronic kidney disease:  Hbg 9.3 - EPO 4,000 units with HD treatments  4. Secondary Hyperparathyroidism: with  hyperphosphatemia.  Not currently on binders.  Will monitor labs   LOS: 2 [place] 2/14/20222:41 PM

## 2021-01-30 NOTE — TOC Initial Note (Signed)
Transition of Care [facility]) - Initial/Assessment Note    Patient Details  Name: [name] MRN: [id number] Date of Birth: 1966-05-26  Transition of Care [facility]) CM/SW Contact:    [name] Hutching, RN Phone Number: 01/30/2021, 3:51 PM  Clinical Narrative:                 Patient admitted to the hospital with volume overload.  Patient is a dialysis patient MWF at [facility] provides transportation.  Patient has oxygen and a trillogy at home, walks with a walker.  Patient reports that he is moving and has his apartment in [place] but nothing is set up.  New Address is [place] [name] has accepted referral for RN, PT, OT and aide. Patient reports that he does not feel ready for discharge today because he is still weak and nothing is set up at home.  Frutoso Schatz will be able to pick him up at discharge.    Expected Discharge Plan: [place] Barriers to Discharge: Continued Medical Work up   Patient Goals and CMS Choice Patient states their goals for this hospitalization and ongoing recovery are:: does not want to go home today- says he is still too weak CMS Medicare.gov Compare Post Acute Care list provided to:: Patient Choice offered to / list presented to : Patient  Expected Discharge Plan and Services Expected Discharge Plan: [place]   Discharge Planning Services: CM Consult Post Acute Care Choice: [place] arrangements for the past 2 months: Apartment                 DME Arranged: N/A         HH Arranged: RN,PT,OT,Nurse's Aide HH Agency: [facility] Date [place]: 01/30/21 Time [place]: 1550 Representative spoke with at [place]: [place] Arrangements/Services Living arrangements for the past 2 months: [place] with:: Self Patient language and need for interpreter reviewed:: Yes Do you feel safe going back to the place where you live?: Yes       Need for Family Participation in Patient Care: Yes (Comment) (dialysis patient) Care giver support system in place?: Yes (comment) (friend [name]) Current home services: DME (Walker, oxygen from [place], trillogy) Criminal Activity/Legal Involvement Pertinent to Current Situation/Hospitalization: No - Comment as needed  Activities of Daily Living Home Assistive Devices/Equipment: Environmental consultant (specify type),CPAP ADL Screening (condition at time of admission) Patient's cognitive ability adequate to safely complete daily activities?: Yes Is the patient deaf or have difficulty hearing?: No Does the patient have difficulty seeing, even when wearing glasses/contacts?: Yes Does the patient have difficulty concentrating, remembering, or making decisions?: No Patient able to express need for assistance with ADLs?: No Does the patient have difficulty dressing or bathing?: Yes Independently performs ADLs?: Yes (appropriate for developmental age) Does the patient have difficulty walking or climbing stairs?: Yes Weakness of Legs: Both Weakness of Arms/Hands: Both  Permission Sought/Granted Permission sought to share information with : Case Manager,Family Supports,Other (comment) Permission granted to share information with : Yes, Verbal Permission Granted     Permission granted to share info w AGENCY: home health agency  Permission granted to share info w Relationship: friend, daughter     Emotional Assessment Appearance:: Appears stated age Attitude/Demeanor/Rapport: Engaged Affect (typically observed): Accepting Orientation: : Oriented to Self,Oriented to Place,Oriented to  Time,Oriented to Situation Alcohol / Substance Use: Not Applicable Psych Involvement: No (comment)  Admission diagnosis:  Hypoglycemia [E16.2] ESRD (end stage renal disease) ([place]) [N18.6] Fall, initial encounter [W19.XXXA] Traumatic rhabdomyolysis, initial encounter ([place]) [T79.6XXA] Patient Active Problem List   Diagnosis  Date Noted  . Hypotension 01/29/2021  . Volume overload 01/29/2021  . Hemodialysis patient ([place]) 06/28/2020  . Complication from renal dialysis device 03/09/2020  . Background diabetic retinopathy associated with type 2 diabetes mellitus ([place]) 10/27/2019  . Hyperkalemia 06/15/2019  . Type 2 diabetes mellitus with hyperglycemia, with long-term current use of insulin ([place]) 03/05/2019  . Hypoglycemia 02/18/2019  . Altered mental status 02/18/2019  . RLQ abdominal mass 01/20/2019  . CHF (congestive heart failure) ([place]) 10/07/2018  . Muscle cramps 10/07/2018  . Shortness of breath 09/08/2018  . Morbid obesity ([place]) 06/24/2018  . Acute on chronic respiratory failure with hypoxia ([place]) 04/26/2018  . Morbid obesity with BMI of 50.0-59.9, adult ([place]) 04/23/2018  . Chronic diastolic heart failure ([place]) 02/10/2018  . Obstructive sleep apnea 02/10/2018  . Obesity hypoventilation syndrome ([place]) 09/26/2017  . Diabetes mellitus without complication ([place]) 88/89/1694  . CAD (coronary artery disease) 01/31/2017  . Hyperlipidemia, unspecified 01/31/2017  . Essential hypertension 01/17/2017  . Psoriasis 01/17/2017  . Chronic gout of multiple sites 12/21/2016  . DM type 2 with diabetic peripheral neuropathy ([place]) 06/25/2016  . Cardiomyopathy ([place]) 08/19/2012  . End stage renal disease ([place]) 08/19/2012  . Hyperlipidemia associated with type 2 diabetes mellitus ([place]) 12/17/1998   PCP:  Care, [facility]:   [place], [place] - [address] [place] [address] Phone: [phone] Fax: [place], [address] [address] [place] [address] Phone: [phone] Fax: [phone]     Social Determinants of Health (SDOH) Interventions    Readmission Risk Interventions No flowsheet data found.

## 2021-01-31 DIAGNOSIS — E1142 Type 2 diabetes mellitus with diabetic polyneuropathy: Secondary | ICD-10-CM

## 2021-01-31 LAB — GLUCOSE, CAPILLARY
Glucose-Capillary: 109 mg/dL — ABNORMAL HIGH (ref 70–99)
Glucose-Capillary: 137 mg/dL — ABNORMAL HIGH (ref 70–99)

## 2021-01-31 MED ORDER — DICLOFENAC SODIUM 1 % EX GEL: 4.0000 g | Freq: Four times a day (QID) | CUTANEOUS | 1 refills | Status: DC | PRN

## 2021-01-31 MED ORDER — CYCLOBENZAPRINE HCL 5 MG PO TABS: 5.0000 mg | ORAL_TABLET | Freq: Two times a day (BID) | ORAL | 0 refills | Status: DC | PRN

## 2021-01-31 NOTE — Discharge Summary (Signed)
Physician Discharge Summary  LELA GELL QBH:[id number] DOB: 09-20-1966 DOA: 01/27/2021  PCP: [facility]  Admit date: 01/27/2021 Discharge date: 01/31/2021  Admitted From: home Disposition:  home  Recommendations for Outpatient Follow-up:  1. Follow up with PCP in 1-2 weeks 2. Please obtain BMP/CBC in one week 3. Please follow up on patient's BP.  Several medications were held due to hypotension, and pt remained normotensive for the most part.    Home Health: PT, OT, RN, Aide  Equipment/Devices: 3-n-1   Discharge Condition: Stable  CODE STATUS: Full  Diet recommendation: Heart Healthy / Carb Modified     Discharge Diagnoses: Principal Problem:   Volume overload Active Problems:   End stage renal disease (HCC)   Hypoglycemia   Acute on chronic respiratory failure with hypoxia (HCC)   Hypotension   DM type 2 with diabetic peripheral neuropathy (HCC)   Essential hypertension   CAD (coronary artery disease)   Chronic diastolic heart failure (HCC)   Psoriasis   Chronic gout of multiple sites   Hyperlipidemia, unspecified   Obstructive sleep apnea   Morbid obesity ([place])   Obesity hypoventilation syndrome ([place])    Summary of HPI and Hospital Course:  55 year old male with past medical history of ESRD on dialysis MWF, hypertension, hyperlipidemia, COPD, OSA on trilogy at night, CAD, psoriasis presented to the ED on the evening of 01/27/2021 after being found down in his home for about 48 hours. He was found by a friend after patient did not show up for dialysis. On admission, patient reporting some midsternal chest discomfort, and aching pain in both legs and knees.  In the ED, BP uncontrolled 198/88, otherwise normal vitals. Labs remarkable for hypoglycemia initial blood glucose was 33, BUN 56, creatinine 8.74, potassium 4.7. VBG with pH 7.26 and bicarb 26. CK was 3336 and hs-troponin 70. EKG with normal sinus rhythm at a rate of 84 bpm without acute ischemic  changes. Chest x-ray showed chronic cardiomegaly and vascular congestion.    Hypoglycemia -present on admission, resolved.  In the setting of being down for 48 hours, no p.o. intake. Treated with D50 twice. Was briefly placed on D5w, stopped given volume overload.  Patient now eating and drinking and sugars improved.  Hypotension -resolved.  Sudden onset during dialysis 2/12, session aborted early.  Occurred after 1 L had been taken off. Patient was given 500 cc bolus and blood pressure rebounded and is again elevated. Transferred to stepdown for close monitoring, BPs remained overall stable.  Fall of unknown known cause -patient down in his home for 48 hours and found by a friend after not showing up for dialysis. Cause unknown. Patient reports some chest discomfort but has no ischemic changes on EKG and only mild flat troponin elevation. --PT evaluation pending --Telemetry without dysrhythmias --Referral to cardiology for Ziopatch ambulatory monitor  Rhabdomyolysis -CK over 3000 but in ESRD is not considered significant elevation.  He was treated with normal saline overnight upon admission, stopped for concern of volume overload.     Dialyzed.  Type 2 diabetes with renal complications - stop glimepiride (would stop it at d/c, sulfonylureas in dialysis patients increase risk of hypoglycemia).   Takes Tresiba 20 units bedtime.  Hold off basal insulin until sugars stable.   Covered with sliding scale Novolog.    Acute on respiratory failure with hypoxia -sudden onset during dialysis 2/12.  Appears related to volume overload.  O2 per protocol, weaned.  ESRD -on dialysis MWF schedule.  Nephrology was  consulted for dialysis.  GERD -continue PPI  Coronary artery disease -stable.  Continue aspirin, statin and Imdur.   HFrEF - Echo 09/2019 EF mildly reced at 45-50%, mod LVH.    Gout -does not appear acutely flared but monitor closely. Continue allopurinol.  Hyperlipidemia  -continue statin  COPD -without acute exacerbation on admission. Continue home inhaler regimen.  OSA -uses Trilogy NIV.  BiPAP nightly was used here, pt reportedly refused last night. Noted to have daytime drowsiness.  Morbid obesity: Body mass index is 45.83 kg/m. Complicates overall care and prognosis. Recommend lifestyle modifications including physical activity and diet aimed at weight loss. Primary care follow-up.    Discharge Instructions   Discharge Instructions    (HEART FAILURE PATIENTS) Call MD:  Anytime you have any of the following symptoms: 1) 3 pound weight gain in 24 hours or 5 pounds in 1 week 2) shortness of breath, with or without a dry hacking cough 3) swelling in the hands, feet or stomach 4) if you have to sleep on extra pillows at night in order to breathe.   Complete by: As directed    Call MD for:  extreme fatigue   Complete by: As directed    Call MD for:  persistant dizziness or light-headedness   Complete by: As directed    Call MD for:  persistant nausea and vomiting   Complete by: As directed    Call MD for:  severe uncontrolled pain   Complete by: As directed    Call MD for:  temperature >100.4   Complete by: As directed    Diet - low sodium heart healthy   Complete by: As directed    Discharge instructions   Complete by: As directed    We had to stop several of your medications because your blood pressure goes too low. Please stop the medications as directed.    Monitor your blood pressure at home.   Write down your blood pressures to bring to follow up doctor appointments.  If you feel dizzy or lightheaded, your BP might be low and you should check it. Sit down or lay down if you feel that way, so you don't have a fall / injury.   Increase activity slowly   Complete by: As directed      Allergies as of 01/31/2021      Reactions   Shellfish Allergy Anaphylaxis   Face and throat swelling, difficulty breathing Allergy can be triggered by  touching (contact)   Betadine [povidone Iodine] Rash   Povidone-iodine Rash      Medication List    STOP taking these medications   carvedilol 25 MG tablet Commonly known as: COREG   furosemide 40 MG tablet Commonly known as: LASIX   hydrALAZINE 100 MG tablet Commonly known as: APRESOLINE   isosorbide mononitrate 60 MG 24 hr tablet Commonly known as: IMDUR   metolazone 5 MG tablet Commonly known as: ZAROXOLYN     TAKE these medications   albuterol 108 (90 Base) MCG/ACT inhaler Commonly known as: VENTOLIN HFA Inhale 2 puffs into the lungs every 4 (four) hours as needed for wheezing or shortness of breath.   allopurinol 100 MG tablet Commonly known as: ZYLOPRIM Take 100 mg by mouth daily.   Anoro Ellipta 62.5-25 MCG/INH Aepb Generic drug: umeclidinium-vilanterol INHALE 1 PUFF INTO THE LUNGS DAILY.   atorvastatin 40 MG tablet Commonly known as: LIPITOR Take 1 tablet (40 mg total) by mouth at bedtime.   betamethasone dipropionate 0.05 %  cream Apply topically as directed. Apply every AM for 2 weeks then decrease to every other day   calcipotriene 0.005 % cream Commonly known as: DOVONOX Apply topically as directed. Apply every PM for two weeks then every other day alternating with the betamethasone   calcium acetate 667 MG capsule Commonly known as: PHOSLO Take 667 mg by mouth 3 (three) times daily.   Cholecalciferol 25 MCG (1000 UT) tablet Commonly known as: D3-1000 Take 2 tablets (2,000 Units total) by mouth daily.   cyclobenzaprine 5 MG tablet Commonly known as: FLEXERIL Take 1 tablet (5 mg total) by mouth 2 (two) times daily as needed for muscle spasms.   diclofenac Sodium 1 % Gel Commonly known as: VOLTAREN Apply 4 g topically 4 (four) times daily as needed. For Knee Pain   Durezol 0.05 % Emul Generic drug: Difluprednate   Enstilar 0.005-0.064 % Foam Generic drug: Calcipotriene-Betameth Diprop Apply to skin qd-bid   gabapentin 300 MG  capsule Commonly known as: NEURONTIN Take 300 mg by mouth daily.   GaviLAX 17 GM/SCOOP powder Generic drug: polyethylene glycol powder Take 17 g by mouth daily as needed for mild constipation or moderate constipation.   glimepiride 4 MG tablet Commonly known as: AMARYL Take 4 mg by mouth daily.   GNP Aspirin Low Dose 81 MG EC tablet Generic drug: aspirin TAKE 1 TABLET BY MOUTH ONCE DAILY.   latanoprost 0.005 % ophthalmic solution Commonly known as: XALATAN   lidocaine-prilocaine cream Commonly known as: EMLA Apply 1 application topically daily as needed (port).   Magnesium Oxide 200 MG Tabs Take 1 tablet (200 mg total) by mouth daily. What changed: how much to take   multivitamin Tabs tablet Take 1 tablet by mouth at bedtime. What changed: when to take this   omeprazole 20 MG capsule Commonly known as: PRILOSEC Take 1 capsule (20 mg total) by mouth 2 (two) times daily before a meal.   Skyrizi 150 MG/ML Sosy Generic drug: Risankizumab-rzaa Inject 150 mg into the skin as directed. Every 12 weeks for maintenance.   Tab-A-Vite Tabs TAKE 1 TABLET BY MOUTH ONCE DAILY.   traZODone 150 MG tablet Commonly known as: DESYREL Take by mouth at bedtime.   Tyler Aas FlexTouch 100 UNIT/ML FlexTouch Pen Generic drug: insulin degludec Inject 20 Units into the skin at bedtime.   Trulicity 1.5 IH/0.3UU Sopn Generic drug: Dulaglutide Inject 1.5 sq every week            Durable Medical Equipment  (From admission, onward)         Start     Ordered   01/30/21 0925  For home use only DME 3 n 1  Once        01/30/21 0924          Allergies  Allergen Reactions  . Shellfish Allergy Anaphylaxis    Face and throat swelling, difficulty breathing Allergy can be triggered by touching (contact)  . Betadine [Povidone Iodine] Rash  . Povidone-Iodine Rash     If you experience worsening of your admission symptoms, develop shortness of breath, life threatening emergency,  suicidal or homicidal thoughts you must seek medical attention immediately by calling 911 or calling your MD immediately  if symptoms less severe.    Please note   You were cared for by a hospitalist during your hospital stay. If you have any questions about your discharge medications or the care you received while you were in the hospital after you are discharged, you can call  the unit and asked to speak with the hospitalist on call if the hospitalist that took care of you is not available. Once you are discharged, your primary care physician will handle any further medical issues. Please note that NO REFILLS for any discharge medications will be authorized once you are discharged, as it is imperative that you return to your primary care physician (or establish a relationship with a primary care physician if you do not have one) for your aftercare needs so that they can reassess your need for medications and monitor your lab values.   Consultations:  Nephrology    Procedures/Studies: CT Head Wo Contrast  Result Date: 01/27/2021 CLINICAL DATA:  Delirium.  Found lying face down on floor. EXAM: CT HEAD WITHOUT CONTRAST TECHNIQUE: Contiguous axial images were obtained from the base of the skull through the vertex without intravenous contrast. COMPARISON:  Head CT 01/12/2018 FINDINGS: Brain: Stable degree of atrophy and chronic small vessel ischemia. No intracranial hemorrhage, mass effect, or midline shift. No hydrocephalus. The basilar cisterns are patent. No evidence of territorial infarct or acute ischemia. No extra-axial or intracranial fluid collection. Vascular: Atherosclerosis of skullbase vasculature without hyperdense vessel or abnormal calcification. Skull: No fracture or focal lesion. Sinuses/Orbits: Occasional opacification of lower right mastoid air cells. Paranasal sinuses are unremarkable. Again seen proptosis. Other: None. IMPRESSION: 1. No acute intracranial abnormality. 2. Stable  atrophy and chronic small vessel ischemia. Electronically Signed   By: Keith Rake M.D.   On: 01/27/2021 22:20   DG Chest Portable 1 View  Result Date: 01/27/2021 CLINICAL DATA:  Shortness of breath. Found lying face down on floor. EXAM: PORTABLE CHEST 1 VIEW COMPARISON:  06/18/2019 FINDINGS: Chronic cardiomegaly. Vascular congestion also seen on prior exam. No convincing pulmonary edema. No focal airspace disease. No pleural fluid or pneumothorax. No acute osseous abnormalities are seen. Soft tissue attenuation from habitus limits assessment. IMPRESSION: Chronic cardiomegaly and vascular congestion. Electronically Signed   By: Keith Rake M.D.   On: 01/27/2021 21:26   ECHOCARDIOGRAM COMPLETE  Result Date: 01/29/2021    ECHOCARDIOGRAM REPORT   Patient Name:   Kordel NATHANYEL DEFENBAUGH Date of Exam: 01/29/2021 Medical Rec #:  [id number]          Height:       64.0 in Accession #:    [id number]         Weight:       267.0 lb Date of Birth:  1966/04/29          BSA:          2.212 m Patient Age:    15 years           BP:           142/81 mmHg Patient Gender: M                  HR:           81 bpm. Exam Location:  [facility] Procedure: 2D Echo Indications:     Systolic CHF  History:         Patient has prior history of Echocardiogram examinations. CAD,                  Pulmonary HTN and COPD; Risk Factors:Hypertension, Diabetes and                  Morbid Obesity.  Sonographer:     Wallace Keller Thornton-Maynard Referring Phys:  [id number] HYQMV A GRIFFITH Diagnosing Phys: Jenkins Rouge  MD  Sonographer Comments: Suboptimal subcostal window. Image acquisition challenging due to patient body habitus. IMPRESSIONS  1. Left ventricular ejection fraction, by estimation, is 55 to 60%. The left ventricle has normal function. The left ventricle has no regional wall motion abnormalities. There is moderate left ventricular hypertrophy. Left ventricular diastolic parameters are consistent with Grade II diastolic dysfunction  (pseudonormalization). Elevated left ventricular end-diastolic pressure.  2. Right ventricular systolic function is normal. The right ventricular size is normal.  3. The mitral valve is normal in structure. No evidence of mitral valve regurgitation. No evidence of mitral stenosis.  4. The aortic valve was not well visualized. Aortic valve regurgitation is not visualized. Mild to moderate aortic valve sclerosis/calcification is present, without any evidence of aortic stenosis.  5. The inferior vena cava is normal in size with greater than 50% respiratory variability, suggesting right atrial pressure of 3 mmHg. FINDINGS  Left Ventricle: Left ventricular ejection fraction, by estimation, is 55 to 60%. The left ventricle has normal function. The left ventricle has no regional wall motion abnormalities. The left ventricular internal cavity size was normal in size. There is  moderate left ventricular hypertrophy. Left ventricular diastolic parameters are consistent with Grade II diastolic dysfunction (pseudonormalization). Elevated left ventricular end-diastolic pressure. Right Ventricle: The right ventricular size is normal. No increase in right ventricular wall thickness. Right ventricular systolic function is normal. Left Atrium: Left atrial size was normal in size. Right Atrium: Right atrial size was not well visualized. Pericardium: There is no evidence of pericardial effusion. Mitral Valve: The mitral valve is normal in structure. There is mild thickening of the mitral valve leaflet(s). There is mild calcification of the mitral valve leaflet(s). Mild mitral annular calcification. No evidence of mitral valve regurgitation. No evidence of mitral valve stenosis. Tricuspid Valve: The tricuspid valve is normal in structure. Tricuspid valve regurgitation is mild . No evidence of tricuspid stenosis. Aortic Valve: The aortic valve was not well visualized. Aortic valve regurgitation is not visualized. Mild to moderate aortic  valve sclerosis/calcification is present, without any evidence of aortic stenosis. Aortic valve mean gradient measures 4.0 mmHg.  Aortic valve peak gradient measures 7.6 mmHg. Aortic valve area, by VTI measures 2.31 cm. Pulmonic Valve: The pulmonic valve was normal in structure. Pulmonic valve regurgitation is not visualized. No evidence of pulmonic stenosis. Aorta: The aortic root is normal in size and structure. Venous: The inferior vena cava is normal in size with greater than 50% respiratory variability, suggesting right atrial pressure of 3 mmHg. IAS/Shunts: No atrial level shunt detected by color flow Doppler.  LEFT VENTRICLE PLAX 2D LVIDd:         4.95 cm  Diastology LVIDs:         3.15 cm  LV e' medial:    6.85 cm/s LV PW:         1.58 cm  LV E/e' medial:  15.5 LV IVS:        1.85 cm  LV e' lateral:   6.53 cm/s LVOT diam:     2.40 cm  LV E/e' lateral: 16.2 LV SV:         54 LV SV Index:   25 LVOT Area:     4.52 cm  LEFT ATRIUM              Index LA diam:        2.80 cm  1.27 cm/m LA Vol (A2C):   113.0 ml 51.09 ml/m LA Vol (A4C):   74.1  ml  33.50 ml/m LA Biplane Vol: 98.1 ml  44.35 ml/m  AORTIC VALVE                   PULMONIC VALVE AV Area (Vmax):    2.18 cm    PV Vmax:       1.06 m/s AV Area (Vmean):   2.18 cm    PV Peak grad:  4.5 mmHg AV Area (VTI):     2.31 cm AV Vmax:           138.00 cm/s AV Vmean:          95.400 cm/s AV VTI:            0.235 m AV Peak Grad:      7.6 mmHg AV Mean Grad:      4.0 mmHg LVOT Vmax:         66.60 cm/s LVOT Vmean:        45.900 cm/s LVOT VTI:          0.120 m LVOT/AV VTI ratio: 0.51  AORTA Ao Root diam: 3.50 cm MITRAL VALVE MV Area (PHT): 4.00 cm     SHUNTS MV E velocity: 106.00 cm/s  Systemic VTI:  0.12 m MV A velocity: 72.80 cm/s   Systemic Diam: 2.40 cm MV E/A ratio:  1.46 Jenkins Rouge MD Electronically signed by Jenkins Rouge MD Signature Date/Time: 01/29/2021/10:30:06 AM    Final       Dialysis   Subjective: pt up in chair, sleeping but woke to voice.   Appears drowsy today.  Says he's feeling well today, no CP, N/V, SOB, dizziness or other comlpaints.    Discharge Exam: Vitals:   01/31/21 0456 01/31/21 0755  BP: 122/73 (!) 134/98  Pulse: 80 72  Resp: 15 15  Temp: 98 F (36.7 C) 98.2 F (36.8 C)  SpO2: 97% 97%   Vitals:   01/31/21 0000 01/31/21 0141 01/31/21 0456 01/31/21 0755  BP:  132/76 122/73 (!) 134/98  Pulse: 82 80 80 72  Resp: (!) 9 16 15 15   Temp:  98 F (36.7 C) 98 F (36.7 C) 98.2 F (36.8 C)  TempSrc:  Oral  Oral  SpO2: 92% 94% 97% 97%  Weight:      Height:        General: Pt is alert, awake, not in acute distress, obese Cardiovascular: RRR, S1/S2 +, no rubs, no gallops Respiratory: CTA bilaterally, no wheezing, no rhonchi Abdominal: Soft, NT, ND, bowel sounds + Extremities: trace BLE edema, no cyanosis    The results of significant diagnostics from this hospitalization (including imaging, microbiology, ancillary and laboratory) are listed below for reference.     Microbiology: Recent Results (from the past 240 hour(s))  Resp Panel by RT-PCR (Flu A&B, Covid) Nasopharyngeal Swab     Status: None   Collection Time: 01/27/21  9:06 PM   Specimen: Nasopharyngeal Swab; Nasopharyngeal(NP) swabs in vial transport medium  Result Value Ref Range Status   SARS Coronavirus 2 by RT PCR NEGATIVE NEGATIVE Final    Comment: (NOTE) SARS-CoV-2 target nucleic acids are NOT DETECTED.  The SARS-CoV-2 RNA is generally detectable in upper respiratory specimens during the acute phase of infection. The lowest concentration of SARS-CoV-2 viral copies this assay can detect is 138 copies/mL. A negative result does not preclude SARS-Cov-2 infection and should not be used as the sole basis for treatment or other patient management decisions. A negative result may occur with  improper specimen collection/handling, submission  of specimen other than nasopharyngeal swab, presence of viral mutation(s) within the areas targeted by  this assay, and inadequate number of viral copies(<138 copies/mL). A negative result must be combined with clinical observations, patient history, and epidemiological information. The expected result is Negative.  Fact Sheet for Patients:  EntrepreneurPulse.com.au  Fact Sheet for Healthcare Providers:  IncredibleEmployment.be  This test is no t yet approved or cleared by the [place] FDA and  has been authorized for detection and/or diagnosis of SARS-CoV-2 by FDA under an Emergency Use Authorization (EUA). This EUA will remain  in effect (meaning this test can be used) for the duration of the COVID-19 declaration under Section 564(b)(1) of the Act, 21 U.S.C.section 360bbb-3(b)(1), unless the authorization is terminated  or revoked sooner.       Influenza A by PCR NEGATIVE NEGATIVE Final   Influenza B by PCR NEGATIVE NEGATIVE Final    Comment: (NOTE) The Xpert Xpress SARS-CoV-2/FLU/RSV plus assay is intended as an aid in the diagnosis of influenza from Nasopharyngeal swab specimens and should not be used as a sole basis for treatment. Nasal washings and aspirates are unacceptable for Xpert Xpress SARS-CoV-2/FLU/RSV testing.  Fact Sheet for Patients: EntrepreneurPulse.com.au  Fact Sheet for Healthcare Providers: IncredibleEmployment.be  This test is not yet approved or cleared by the [place] FDA and has been authorized for detection and/or diagnosis of SARS-CoV-2 by FDA under an Emergency Use Authorization (EUA). This EUA will remain in effect (meaning this test can be used) for the duration of the COVID-19 declaration under Section 564(b)(1) of the Act, 21 U.S.C. section 360bbb-3(b)(1), unless the authorization is terminated or revoked.  Performed at [facility], [address] [place], [place] [address]   MRSA PCR Screening     Status: None   Collection Time: 01/28/21  2:44 PM    Specimen: Nasal Mucosa; Nasopharyngeal  Result Value Ref Range Status   MRSA by PCR NEGATIVE NEGATIVE Final    Comment:        The GeneXpert MRSA Assay (FDA approved for NASAL specimens only), is one component of a comprehensive MRSA colonization surveillance program. It is not intended to diagnose MRSA infection nor to guide or monitor treatment for MRSA infections. Performed at [facility], [address] [place], [place] [address]      Labs: BNP (last 3 results) No results for input(s): BNP in the last 8760 hours. Basic Metabolic Panel: Recent Labs  Lab 01/27/21 2108 01/28/21 0500 01/29/21 0906 01/30/21 0930  NA 143 144 139 138  K 4.7 4.8 4.4 3.4*  CL 105 105 100 100  CO2 23 27 24 26   GLUCOSE 123* 92 120* 129*  BUN 56* 62* 58* 46*  CREATININE 8.74* 9.14* 9.13* 6.39*  CALCIUM 9.0 8.7* 8.9 8.5*  PHOS  --   --   --  4.4   Liver Function Tests: Recent Labs  Lab 01/27/21 2108 01/30/21 0930  AST 67*  --   ALT 26  --   ALKPHOS 150*  --   BILITOT 1.1  --   PROT 8.5*  --   ALBUMIN 3.9 3.4*   No results for input(s): LIPASE, AMYLASE in the last 168 hours. No results for input(s): AMMONIA in the last 168 hours. CBC: Recent Labs  Lab 01/27/21 2108 01/28/21 0500 01/29/21 0906 01/30/21 0930  WBC 11.2* 12.8* 9.3 6.9  NEUTROABS 8.7*  --  6.1  --   HGB 10.0* 9.6* 9.8* 9.3*  HCT 31.4* 31.7* 32.3* 30.0*  MCV 93.7 94.9 95.6 93.5  PLT 216 205 198 179   Cardiac Enzymes: Recent Labs  Lab 01/27/21 2108 01/28/21 0124 01/28/21 0526 01/28/21 1402  CKTOTAL 3,336* 3,471* 3,382* 3,317*  CKMB  --   --   --  4.0   BNP: Invalid input(s): POCBNP CBG: Recent Labs  Lab 01/29/21 2127 01/30/21 0815 01/30/21 1523 01/30/21 2000 01/31/21 0757  GLUCAP 168* 100* 87 166* 109*   D-Dimer No results for input(s): DDIMER in the last 72 hours. Hgb A1c No results for input(s): HGBA1C in the last 72 hours. Lipid Profile No results for input(s): CHOL, HDL,  LDLCALC, TRIG, CHOLHDL, LDLDIRECT in the last 72 hours. Thyroid function studies No results for input(s): TSH, T4TOTAL, T3FREE, THYROIDAB in the last 72 hours.  Invalid input(s): FREET3 Anemia work up No results for input(s): VITAMINB12, FOLATE, FERRITIN, TIBC, IRON, RETICCTPCT in the last 72 hours. Urinalysis    Component Value Date/Time   COLORURINE YELLOW 02/18/2019 1650   APPEARANCEUR Clear 08/11/2019 0958   LABSPEC 1.016 02/18/2019 1650   PHURINE 7.0 02/18/2019 1650   GLUCOSEU 2+ (A) 08/11/2019 0958   HGBUR NEGATIVE 02/18/2019 1650   BILIRUBINUR Negative 08/11/2019 0958   KETONESUR NEGATIVE 02/18/2019 1650   PROTEINUR 3+ (A) 08/11/2019 0958   PROTEINUR NEGATIVE 02/18/2019 1650   NITRITE Negative 08/11/2019 0958   NITRITE NEGATIVE 02/18/2019 1650   LEUKOCYTESUR Negative 08/11/2019 0958   LEUKOCYTESUR NEGATIVE 02/18/2019 1650   Sepsis Labs Invalid input(s): PROCALCITONIN,  WBC,  LACTICIDVEN Microbiology Recent Results (from the past 240 hour(s))  Resp Panel by RT-PCR (Flu A&B, Covid) Nasopharyngeal Swab     Status: None   Collection Time: 01/27/21  9:06 PM   Specimen: Nasopharyngeal Swab; Nasopharyngeal(NP) swabs in vial transport medium  Result Value Ref Range Status   SARS Coronavirus 2 by RT PCR NEGATIVE NEGATIVE Final    Comment: (NOTE) SARS-CoV-2 target nucleic acids are NOT DETECTED.  The SARS-CoV-2 RNA is generally detectable in upper respiratory specimens during the acute phase of infection. The lowest concentration of SARS-CoV-2 viral copies this assay can detect is 138 copies/mL. A negative result does not preclude SARS-Cov-2 infection and should not be used as the sole basis for treatment or other patient management decisions. A negative result may occur with  improper specimen collection/handling, submission of specimen other than nasopharyngeal swab, presence of viral mutation(s) within the areas targeted by this assay, and inadequate number of  viral copies(<138 copies/mL). A negative result must be combined with clinical observations, patient history, and epidemiological information. The expected result is Negative.  Fact Sheet for Patients:  EntrepreneurPulse.com.au  Fact Sheet for Healthcare Providers:  IncredibleEmployment.be  This test is no t yet approved or cleared by the [place] FDA and  has been authorized for detection and/or diagnosis of SARS-CoV-2 by FDA under an Emergency Use Authorization (EUA). This EUA will remain  in effect (meaning this test can be used) for the duration of the COVID-19 declaration under Section 564(b)(1) of the Act, 21 U.S.C.section 360bbb-3(b)(1), unless the authorization is terminated  or revoked sooner.       Influenza A by PCR NEGATIVE NEGATIVE Final   Influenza B by PCR NEGATIVE NEGATIVE Final    Comment: (NOTE) The Xpert Xpress SARS-CoV-2/FLU/RSV plus assay is intended as an aid in the diagnosis of influenza from Nasopharyngeal swab specimens and should not be used as a sole basis for treatment. Nasal washings and aspirates are unacceptable for Xpert Xpress SARS-CoV-2/FLU/RSV testing.  Fact Sheet for  Patients: EntrepreneurPulse.com.au  Fact Sheet for Healthcare Providers: IncredibleEmployment.be  This test is not yet approved or cleared by the [place] FDA and has been authorized for detection and/or diagnosis of SARS-CoV-2 by FDA under an Emergency Use Authorization (EUA). This EUA will remain in effect (meaning this test can be used) for the duration of the COVID-19 declaration under Section 564(b)(1) of the Act, 21 U.S.C. section 360bbb-3(b)(1), unless the authorization is terminated or revoked.  Performed at [facility], [address] [place], [place] [address]   MRSA PCR Screening     Status: None   Collection Time: 01/28/21  2:44 PM   Specimen: Nasal Mucosa;  Nasopharyngeal  Result Value Ref Range Status   MRSA by PCR NEGATIVE NEGATIVE Final    Comment:        The GeneXpert MRSA Assay (FDA approved for NASAL specimens only), is one component of a comprehensive MRSA colonization surveillance program. It is not intended to diagnose MRSA infection nor to guide or monitor treatment for MRSA infections. Performed at [facility], [address] [place], [place] [address]      Time coordinating discharge: Over 30 minutes  SIGNED:   Ezekiel Slocumb, DO [facility] 01/31/2021, 10:28 AM   If 7PM-7AM, please contact night-coverage [email or web address]

## 2021-01-31 NOTE — Progress Notes (Signed)
Pt refused to wear CPAP for tonight. Oxygen saturation is 95% on 2lpm/[place].

## 2021-01-31 NOTE — Care Management Important Message (Signed)
Important Message  Patient Details  Name: [name] MRN: [id number] Date of Birth: [birth year]   Medicare Important Message Given:  Yes     Dannette Barbara 01/31/2021, 12:02 PM

## 2021-01-31 NOTE — Progress Notes (Signed)
[facility]  ROUNDING NOTE   Subjective:   Hemodialysis treatment yesterday. Tolerated treatment well. UF of 1580mL.    Objective:  Vital signs in last 24 hours:  Temp:  [97.8 F (36.6 C)-98.4 F (36.9 C)] 98 F (36.7 C) (02/15 1138) Pulse Rate:  [72-82] 73 (02/15 1138) Resp:  [9-20] 15 (02/15 1138) BP: (122-134)/(73-98) 131/76 (02/15 1138) SpO2:  [92 %-99 %] 95 % (02/15 1138)  Weight change:  Filed Weights   01/27/21 2043  Weight: 121.1 kg    Intake/Output: I/O last 3 completed shifts: In: 8182 [P.O.:1840] Out: 2100 [Urine:600; Other:1500]   Intake/Output this shift:  Total I/O In: 240 [P.O.:240] Out: -   Physical Exam: General: NAD, laying in bed  Head: Normocephalic, atraumatic. Moist oral mucosal membranes  Eyes: Anicteric  Neck: Supple, trachea midline  Lungs:  Diminished bilateral  Heart: Regular rate and rhythm  Abdomen:  Soft, nontender,   Extremities:  + peripheral edema.  Neurologic: Nonfocal, moving all four extremities  Skin: No lesions  Access: Left AVF    Basic Metabolic Panel: Recent Labs  Lab 01/27/21 2108 01/28/21 0500 01/29/21 0906 01/30/21 0930  NA 143 144 139 138  K 4.7 4.8 4.4 3.4*  CL 105 105 100 100  CO2 23 27 24 26   GLUCOSE 123* 92 120* 129*  BUN 56* 62* 58* 46*  CREATININE 8.74* 9.14* 9.13* 6.39*  CALCIUM 9.0 8.7* 8.9 8.5*  PHOS  --   --   --  4.4    Liver Function Tests: Recent Labs  Lab 01/27/21 2108 01/30/21 0930  AST 67*  --   ALT 26  --   ALKPHOS 150*  --   BILITOT 1.1  --   PROT 8.5*  --   ALBUMIN 3.9 3.4*   No results for input(s): LIPASE, AMYLASE in the last 168 hours. No results for input(s): AMMONIA in the last 168 hours.  CBC: Recent Labs  Lab 01/27/21 2108 01/28/21 0500 01/29/21 0906 01/30/21 0930  WBC 11.2* 12.8* 9.3 6.9  NEUTROABS 8.7*  --  6.1  --   HGB 10.0* 9.6* 9.8* 9.3*  HCT 31.4* 31.7* 32.3* 30.0*  MCV 93.7 94.9 95.6 93.5  PLT 216 205 198 179    Cardiac  Enzymes: Recent Labs  Lab 01/27/21 2108 01/28/21 0124 01/28/21 0526 01/28/21 1402  CKTOTAL 3,336* 3,471* 3,382* 3,317*  CKMB  --   --   --  4.0    BNP: Invalid input(s): POCBNP  CBG: Recent Labs  Lab 01/30/21 0815 01/30/21 1523 01/30/21 2000 01/31/21 0757 01/31/21 1144  GLUCAP 100* 87 166* 109* 137*    Microbiology: Results for orders placed or performed during the hospital encounter of 01/27/21  Resp Panel by RT-PCR (Flu A&B, Covid) Nasopharyngeal Swab     Status: None   Collection Time: 01/27/21  9:06 PM   Specimen: Nasopharyngeal Swab; Nasopharyngeal(NP) swabs in vial transport medium  Result Value Ref Range Status   SARS Coronavirus 2 by RT PCR NEGATIVE NEGATIVE Final    Comment: (NOTE) SARS-CoV-2 target nucleic acids are NOT DETECTED.  The SARS-CoV-2 RNA is generally detectable in upper respiratory specimens during the acute phase of infection. The lowest concentration of SARS-CoV-2 viral copies this assay can detect is 138 copies/mL. A negative result does not preclude SARS-Cov-2 infection and should not be used as the sole basis for treatment or other patient management decisions. A negative result may occur with  improper specimen collection/handling, submission of specimen other than nasopharyngeal  swab, presence of viral mutation(s) within the areas targeted by this assay, and inadequate number of viral copies(<138 copies/mL). A negative result must be combined with clinical observations, patient history, and epidemiological information. The expected result is Negative.  Fact Sheet for Patients:  EntrepreneurPulse.com.au  Fact Sheet for Healthcare Providers:  IncredibleEmployment.be  This test is no t yet approved or cleared by the [place] FDA and  has been authorized for detection and/or diagnosis of SARS-CoV-2 by FDA under an Emergency Use Authorization (EUA). This EUA will remain  in effect (meaning this  test can be used) for the duration of the COVID-19 declaration under Section 564(b)(1) of the Act, 21 U.S.C.section 360bbb-3(b)(1), unless the authorization is terminated  or revoked sooner.       Influenza A by PCR NEGATIVE NEGATIVE Final   Influenza B by PCR NEGATIVE NEGATIVE Final    Comment: (NOTE) The Xpert Xpress SARS-CoV-2/FLU/RSV plus assay is intended as an aid in the diagnosis of influenza from Nasopharyngeal swab specimens and should not be used as a sole basis for treatment. Nasal washings and aspirates are unacceptable for Xpert Xpress SARS-CoV-2/FLU/RSV testing.  Fact Sheet for Patients: EntrepreneurPulse.com.au  Fact Sheet for Healthcare Providers: IncredibleEmployment.be  This test is not yet approved or cleared by the [place] FDA and has been authorized for detection and/or diagnosis of SARS-CoV-2 by FDA under an Emergency Use Authorization (EUA). This EUA will remain in effect (meaning this test can be used) for the duration of the COVID-19 declaration under Section 564(b)(1) of the Act, 21 U.S.C. section 360bbb-3(b)(1), unless the authorization is terminated or revoked.  Performed at [facility] [address] [place], [place] [address]   MRSA PCR Screening     Status: None   Collection Time: 01/28/21  2:44 PM   Specimen: Nasal Mucosa; Nasopharyngeal  Result Value Ref Range Status   MRSA by PCR NEGATIVE NEGATIVE Final    Comment:        The GeneXpert MRSA Assay (FDA approved for NASAL specimens only), is one component of a comprehensive MRSA colonization surveillance program. It is not intended to diagnose MRSA infection nor to guide or monitor treatment for MRSA infections. Performed at [facility], [address] [place], [place] [address]     Coagulation Studies: No results for input(s): LABPROT, INR in the last 72 hours.  Urinalysis: No results for input(s): COLORURINE,  LABSPEC, PHURINE, GLUCOSEU, HGBUR, BILIRUBINUR, KETONESUR, PROTEINUR, UROBILINOGEN, NITRITE, LEUKOCYTESUR in the last 72 hours.  Invalid input(s): APPERANCEUR    Imaging: No results found.   Medications:    . allopurinol  100 mg Oral Daily  . aspirin EC  81 mg Oral Daily  . calcipotriene   Topical UD  . Chlorhexidine Gluconate Cloth  6 each Topical Q0600  . cholecalciferol  2,000 Units Oral Daily  . diclofenac Sodium  4 g Topical QID  . [START ON 02/01/2021] epoetin (EPOGEN/PROCRIT) injection  4,000 Units Intravenous Q M,W,F-HD  . gabapentin  300 mg Oral QHS  . heparin  5,000 Units Subcutaneous Q8H  . insulin aspart  0-9 Units Subcutaneous TID PC & HS  . latanoprost  1 drop Both Eyes QHS  . magnesium oxide  200 mg Oral Daily  . mouth rinse  15 mL Mouth Rinse q12n4p  . multivitamin  1 tablet Oral Daily  . pantoprazole  40 mg Oral Daily  . umeclidinium-vilanterol  1 puff Inhalation Daily   acetaminophen **OR** acetaminophen, albuterol, cyclobenzaprine, labetalol, lidocaine-prilocaine, magnesium hydroxide,  nitroGLYCERIN, ondansetron **OR** ondansetron (ZOFRAN) IV, polyethylene glycol powder, traMADol, traZODone **OR** traZODone  Assessment/ Plan:  [name] is a 55 y.o. black male with end stage renal disease, sleep apnea, hypertension, diabetes mellitus type II insulin dependent, pulmonary hypertension, psoriasis, gout, hyperlipidemia, COPD, congestive heart failure who is admitted to [facility] on 01/27/2021 for Hypoglycemia [E16.2] ESRD (end stage renal disease) ([place]) [N18.6] Fall, initial encounter [W19.XXXA] Traumatic rhabdomyolysis, initial encounter ([place]) [T79.6XXA]  [facility] MWF [place] Left AVF 121kg.   1. End Stage renal disease: MWF Next treatment will be scheduled for Wednesday  2. Hypertension:  Home regimen of carvedilol, furosemide, metolazone, hydralazine, isosorbide mononitrate. Echo results- diastolic dysfunction  3. Anemia of chronic  kidney disease:  - EPO with HD treatments  4. Secondary Hyperparathyroidism: with hyperphosphatemia.  Not currently on binders.    LOS: 3 Laneisha Mino 2/15/20222:45 PM

## 2021-01-31 NOTE — TOC Transition Note (Signed)
Transition of Care [facility]) - CM/SW Discharge Note   Patient Details  Name: NUMA SCHROETER MRN: [id number] Date of Birth: 1966-05-06  Transition of Care [facility]) CM/SW Contact:  Shelbie Hutching, RN Phone Number: 01/31/2021, 11:15 AM   Clinical Narrative:    Patient is medically cleared for discharge home with home health services.  Patient's daughter is here to pick him up and take him home.  RNCM has ordered RW and 3 in 1 for patient to go home with.  [employer] will deliver equipment and a portable oxygen tank for patient to get home with.  RNCM provided patient with a BP monitor to take home with him so he can monitor his blood pressure.  [place] with [facility] notified of discharge today.   Patient also requested information about life alert for home.  Provided patient with information on [facility] [employer] plans and [employer].  He should be able to get [employer] for free through [facility].     Final next level of care: [place] Barriers to Discharge: Barriers Resolved   Patient Goals and CMS Choice Patient states their goals for this hospitalization and ongoing recovery are:: does not want to go home today- says he is still too weak CMS Medicare.gov Compare Post Acute Care list provided to:: Patient Choice offered to / list presented to : Patient  Discharge Placement                       Discharge Plan and Services   Discharge Planning Services: CM Consult Post Acute Care Choice: Home Health          DME Arranged: [place] DME Agency: [employer] Date DME Agency Contacted: 01/31/21 Time DME Agency Contacted: 1100 Representative spoke with at DME Agency: Dennard Schaumann Arranged: [place] Agency: [facility] Date [place]: 01/31/21 Time [place]: 2800 Representative spoke with at [place]: [place] ([place]) Interventions     Readmission Risk Interventions No  flowsheet data found.

## 2021-01-31 NOTE — Progress Notes (Signed)
DME delivered to room

## 2021-01-31 NOTE — Progress Notes (Signed)
Patient's apartment key retrieved from patient's friend at medical mall entrance. Key given to patient.

## 2021-02-01 ENCOUNTER — Telehealth: Payer: Self-pay | Admitting: Family Medicine

## 2021-02-01 LAB — BLOOD GAS, VENOUS
Acid-base deficit: 1.6 mmol/L (ref 0.0–2.0)
Bicarbonate: 26 mmol/L (ref 20.0–28.0)
O2 Saturation: 62.4 %
Patient temperature: 37
pCO2, Ven: 58 mmHg (ref 44.0–60.0)
pH, Ven: 7.26 (ref 7.250–7.430)
pO2, Ven: 38 mmHg (ref 32.0–45.0)

## 2021-02-01 NOTE — Telephone Encounter (Signed)
Disregard

## 2021-02-07 ENCOUNTER — Other Ambulatory Visit: Payer: Self-pay | Admitting: Internal Medicine

## 2021-02-09 ENCOUNTER — Ambulatory Visit (INDEPENDENT_AMBULATORY_CARE_PROVIDER_SITE_OTHER): Payer: Medicare Other | Admitting: Podiatry

## 2021-02-09 ENCOUNTER — Encounter: Payer: Self-pay | Admitting: Podiatry

## 2021-02-09 ENCOUNTER — Other Ambulatory Visit: Payer: Self-pay

## 2021-02-09 DIAGNOSIS — I872 Venous insufficiency (chronic) (peripheral): Secondary | ICD-10-CM

## 2021-02-09 DIAGNOSIS — B351 Tinea unguium: Secondary | ICD-10-CM

## 2021-02-09 DIAGNOSIS — M79676 Pain in unspecified toe(s): Secondary | ICD-10-CM

## 2021-02-09 DIAGNOSIS — E1142 Type 2 diabetes mellitus with diabetic polyneuropathy: Secondary | ICD-10-CM

## 2021-02-09 DIAGNOSIS — N186 End stage renal disease: Secondary | ICD-10-CM

## 2021-02-09 NOTE — Progress Notes (Signed)
Patient presented to the office for nail care before his scheduled appointment.  He was rescheduled for his regularly scheduled appointment in March.  Gardiner Barefoot DPM

## 2021-02-15 ENCOUNTER — Other Ambulatory Visit: Payer: Self-pay | Admitting: Adult Health

## 2021-02-16 ENCOUNTER — Telehealth: Payer: Self-pay

## 2021-02-16 NOTE — Telephone Encounter (Signed)
Copied from [place] [id number]. Topic: General - Other >> Feb 16, 2021 10:00 AM Oneta Rack wrote: Caller name: Micha  Relation to pt: PT from [facility]  Call back number: [phone]     Reason for call: PT wanted to inform PCP patient declined today visit due to being tired from dialysis. PCP reflects,  [facility]. Caller states Dr. Ronnald Ramp is patient PCP

## 2021-02-22 ENCOUNTER — Telehealth (INDEPENDENT_AMBULATORY_CARE_PROVIDER_SITE_OTHER): Payer: Self-pay | Admitting: Vascular Surgery

## 2021-02-22 ENCOUNTER — Other Ambulatory Visit: Payer: Self-pay | Admitting: Adult Health

## 2021-02-22 NOTE — Telephone Encounter (Addendum)
Called stating that he would like to come in to be seen. Patient stated that his arm has been swollen for 2days and because of that patient did not go to dialysis today. 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 I called Mark at [facility] to request documentation to determine status on patients fistula. He stated there was nothing to document. Elta Guadeloupe stated that patient was able to dialyze on Monday although he had to stick patient 3 times which he called an adverse occurrence . Elta Guadeloupe also stated that patient did not come in today to receive treatment.    I spoke to the patient about what clinical lead's suggestion:go to dialysis so that if something is wrong they will have documentation/order on what happened and/or what needs to be done.   Patient has upcomming appt 03/09/21.   Patient was highly disappointed in feedback that was given and stated he will return for dialysis on Friday.  This note is for documentation purposes only.

## 2021-02-23 ENCOUNTER — Other Ambulatory Visit: Payer: Self-pay

## 2021-02-23 ENCOUNTER — Encounter: Payer: Self-pay | Admitting: Podiatry

## 2021-02-23 ENCOUNTER — Ambulatory Visit (INDEPENDENT_AMBULATORY_CARE_PROVIDER_SITE_OTHER): Payer: Medicare Other | Admitting: Podiatry

## 2021-02-23 DIAGNOSIS — N186 End stage renal disease: Secondary | ICD-10-CM

## 2021-02-23 DIAGNOSIS — I872 Venous insufficiency (chronic) (peripheral): Secondary | ICD-10-CM

## 2021-02-23 DIAGNOSIS — M79676 Pain in unspecified toe(s): Secondary | ICD-10-CM | POA: Diagnosis not present

## 2021-02-23 DIAGNOSIS — E1142 Type 2 diabetes mellitus with diabetic polyneuropathy: Secondary | ICD-10-CM

## 2021-02-23 DIAGNOSIS — B351 Tinea unguium: Secondary | ICD-10-CM

## 2021-02-23 NOTE — Progress Notes (Signed)
This patient returns to my office for at risk foot care.  This patient requires this care by a professional since this patient will be at risk due to having diabetes mellitus and ESRD.  This patient is unable to cut nails himself since the patient cannot reach his nails.These nails are painful walking and wearing shoes.  This patient presents for at risk foot care today. ° °General Appearance  Alert, conversant and in no acute stress. ° °Vascular  Dorsalis pedis and posterior tibial  pulses are weakly  palpable due to swelling. bilaterally.  Capillary return is within normal limits  bilaterally. Temperature is within normal limits  Bilaterally.  Venous stasis  B/L. ° °Neurologic  Senn-Weinstein monofilament wire test within normal limits  bilaterally. Muscle power within normal limits bilaterally. ° °Nails Thick disfigured discolored nails with subungual debris  from hallux to fifth toes bilaterally. No evidence of bacterial infection or drainage bilaterally. ° °Orthopedic  No limitations of motion  feet .  No crepitus or effusions noted.  No bony pathology or digital deformities noted. ° °Skin  normotropic skin with no porokeratosis noted bilaterally.  No signs of infections or ulcers noted.  Punctate psoriasis plantar aspect feet  B/l.  ° °Onychomycosis  Pain in right toes  Pain in left toes ° °Consent was obtained for treatment procedures.   Mechanical debridement of nails 1-5  bilaterally performed with a nail nipper.  Filed with dremel without incident.  ° ° °Return office visit  10 weeks                   Told patient to return for periodic foot care and evaluation due to potential at risk complications. ° ° °Mustapha Colson DPM  °

## 2021-03-07 ENCOUNTER — Other Ambulatory Visit: Payer: Self-pay | Admitting: Adult Health

## 2021-03-08 ENCOUNTER — Other Ambulatory Visit (INDEPENDENT_AMBULATORY_CARE_PROVIDER_SITE_OTHER): Payer: Self-pay | Admitting: Vascular Surgery

## 2021-03-08 DIAGNOSIS — Z992 Dependence on renal dialysis: Secondary | ICD-10-CM

## 2021-03-08 DIAGNOSIS — N186 End stage renal disease: Secondary | ICD-10-CM

## 2021-03-08 NOTE — Progress Notes (Signed)
MRN : [id number]  [name] is a 55 y.o. (10-21-1966) male who presents with chief complaint of No chief complaint on file. [name] Kitchen  History of Present Illness:  The patient returns to the office for follow up regarding problem with the dialysis access. Currently the patient is maintained via a right brachial cephalic fistula.    The patient denies a significant increase in bleeding time after decannulation.  However he has had three infiltrations in the past month alone.    The patient denies hand pain or other symptoms consistent with steal phenomena.  No significant arm swelling.  The patient denies redness or swelling at the access site. The patient denies fever or chills at home or while on dialysis.  The patient denies amaurosis fugax or recent TIA symptoms. There are no recent neurological changes noted. The patient denies claudication symptoms or rest pain symptoms. The patient denies history of DVT, PE or superficial thrombophlebitis. The patient denies recent episodes of angina or shortness of breath.   Duplex ultrasound of the AV access shows a patent access.  The previously noted stenosis at the cephalic confluence is about the same compared to last study but the volume flow is decreased slightly from 1600 cc/min today is 1011 cc/min.   No outpatient medications have been marked as taking for the 03/09/21 encounter (Appointment) with Delana Meyer, Dolores Lory, MD.    Past Medical History:  Diagnosis Date  . Bell's palsy   . Bell's palsy   . Cataract   . CHF (congestive heart failure) ([place])   . Chronic kidney disease    dialysis  . COPD (chronic obstructive pulmonary disease) ([place])   . Coronary artery disease   . Diabetes mellitus without complication ([place])   . Dyspnea    Wheezing  . GERD (gastroesophageal reflux disease)   . Gout   . HOH (hard of hearing)    mild  . Hypercholesterolemia   . Hypertension   . NSVT (nonsustained ventricular tachycardia) ([place])   .  Obstructive sleep apnea    CPAP, O2 use continuosly at 3LPM  . Orthopnea   . Oxygen dependent    3 lpm continuous  . Pneumonia    In Past X2 most recent 1 yr ago  . Psoriasis   . Psoriasis   . Pulmonary HTN ([place])   . Renal failure     Past Surgical History:  Procedure Laterality Date  . AV FISTULA PLACEMENT Right 11/20/2019   Procedure: ARTERIOVENOUS (AV) FISTULA CREATION ( BRACHIAL CEPHALIC );  Surgeon: Katha Cabal, MD;  Location: [facility];  Service: Vascular;  Laterality: Right;  . CATARACT EXTRACTION W/PHACO Left 12/17/2019   Procedure: CATARACT EXTRACTION PHACO AND INTRAOCULAR LENS PLACEMENT ([place]) LEFT ISTENT INJ DIABETIC;  Surgeon: Eulogio Bear, MD;  Location: [facility];  Service: Ophthalmology;  Laterality: Left;  [place] 00:33.2 CDE 2.96 Fluid Pack Lot # [id number]  . CATARACT EXTRACTION W/PHACO Right 02/24/2020   Procedure: CATARACT EXTRACTION PHACO AND INTRAOCULAR LENS PLACEMENT ([facility]) RIGHT DIABETIC ISTENT INJ;  Surgeon: Eulogio Bear, MD;  Location: [facility];  Service: Ophthalmology;  Laterality: Right;  [place] 00:37.3 CDE 2.88 Fluid Pack Lot # [id number]  . CORONARY ANGIOPLASTY WITH STENT PLACEMENT     stent placement  . DIALYSIS/PERMA CATHETER INSERTION N/A 06/22/2019   Procedure: DIALYSIS/PERMA CATHETER INSERTION;  Surgeon: Algernon Huxley, MD;  Location: [place] [facility];  Service: Cardiovascular;  Laterality: N/A;  . DIALYSIS/PERMA CATHETER REMOVAL N/A 05/31/2020  Procedure: DIALYSIS/PERMA CATHETER REMOVAL;  Surgeon: Katha Cabal, MD;  Location: [place] [facility];  Service: Cardiovascular;  Laterality: N/A;  . INSERTION OF AHMED VALVE Left 12/17/2019   Procedure: INSERTION OF iSTENT;  Surgeon: Eulogio Bear, MD;  Location: [facility];  Service: Ophthalmology;  Laterality: Left;    Social History Social History   Tobacco Use  . Smoking status: Never Smoker  . Smokeless tobacco: Never Used  Vaping Use  . Vaping Use: Never used  Substance Use Topics   . Alcohol use: Not Currently    Alcohol/week: 1.0 standard drink    Types: 1 Cans of beer per week    Comment: occasional  . Drug use: Not Currently    Types: Marijuana    Comment: in high school     Family History Family History  Problem Relation Age of Onset  . Heart failure Mother   . Kidney failure Brother     Allergies  Allergen Reactions  . Shellfish Allergy Anaphylaxis    Face and throat swelling, difficulty breathing Allergy can be triggered by touching (contact)  . Other   . Betadine [Povidone Iodine] Rash  . Povidone-Iodine Rash     REVIEW OF SYSTEMS (Negative unless checked)  Constitutional: [] Weight loss  [] Fever  [] Chills Cardiac: [] Chest pain   [] Chest pressure   [] Palpitations   [] Shortness of breath when laying flat   [] Shortness of breath with exertion. Vascular:  [] Pain in legs with walking   [] Pain in legs at rest  [] History of DVT   [] Phlebitis   [] Swelling in legs   [] Varicose veins   [] Non-healing ulcers Pulmonary:   [] Uses home oxygen   [] Productive cough   [] Hemoptysis   [] Wheeze  [] COPD   [] Asthma Neurologic:  [] Dizziness   [] Seizures   [] History of stroke   [] History of TIA  [] Aphasia   [] Vissual changes   [] Weakness or numbness in arm   [] Weakness or numbness in leg Musculoskeletal:   [] Joint swelling   [] Joint pain   [] Low back pain Hematologic:  [] Easy bruising  [] Easy bleeding   [] Hypercoagulable state   [] Anemic Gastrointestinal:  [] Diarrhea   [] Vomiting  [] Gastroesophageal reflux/heartburn   [] Difficulty swallowing. Genitourinary:  [x] Chronic kidney disease   [] Difficult urination  [] Frequent urination   [] Blood in urine Skin:  [] Rashes   [] Ulcers  Psychological:  [] History of anxiety   []  History of major depression.  Physical Examination  There were no vitals filed for this visit. There is no height or weight on file to calculate BMI. Gen: WD/WN, NAD Head: [place]/AT, No temporalis wasting.  Ear/Nose/Throat: Hearing grossly intact, nares  w/o erythema or drainage Eyes: PER, EOMI, sclera nonicteric.  Neck: Supple, no large masses.   Pulmonary:  Good air movement, no audible wheezing bilaterally, no use of accessory muscles.  Cardiac: RRR, no JVD Vascular: Right arm brachiocephalic fistula is pulsatile with a staccato bruit Vessel Right Left  Radial Palpable Palpable  PT Palpable Palpable  DP Palpable Palpable  Gastrointestinal: Non-distended. No guarding/no peritoneal signs.  Musculoskeletal: M/S 5/5 throughout.  No deformity or atrophy.  Neurologic: CN 2-12 intact. Symmetrical.  Speech is fluent. Motor exam as listed above. Psychiatric: Judgment intact, Mood & affect appropriate for pt's clinical situation. Dermatologic: No rashes or ulcers noted.  No changes consistent with cellulitis.  CBC Lab Results  Component Value Date   WBC 6.9 01/30/2021   HGB 9.3 (L) 01/30/2021   HCT 30.0 (L) 01/30/2021   MCV 93.5 01/30/2021  PLT 179 01/30/2021    BMET    Component Value Date/Time   NA 138 01/30/2021 0930   NA 142 04/21/2019 1334   K 3.4 (L) 01/30/2021 0930   CL 100 01/30/2021 0930   CO2 26 01/30/2021 0930   GLUCOSE 129 (H) 01/30/2021 0930   BUN 46 (H) 01/30/2021 0930   BUN 21 04/21/2019 1334   CREATININE 6.39 (H) 01/30/2021 0930   CALCIUM 8.5 (L) 01/30/2021 0930   GFRNONAA 10 (L) 01/30/2021 0930   GFRAA 10 (L) 11/21/2019 1853   CrCl cannot be calculated (Patient's most recent lab result is older than the maximum 21 days allowed.).  COAG Lab Results  Component Value Date   INR 1.1 11/17/2019   INR 1.1 06/15/2019   INR 1.1 02/18/2019    Radiology No results found.   Assessment/Plan 1. Complication from renal dialysis device, sequela Recommend:  The patient is experiencing increasing problems with their right arm dialysis access.  Patient should have a fistulagram with the intention for intervention.  The intention for intervention is to restore appropriate flow and prevent thrombosis and possible  loss of the access.  As well as improve the quality of dialysis therapy.  The risks, benefits and alternative therapies were reviewed in detail with the patient.  All questions were answered.  The patient agrees to proceed with angio/intervention of the right brachial cephalic fistula.    2. End stage renal disease ([place]) At the present time the patient has adequate dialysis access.  Continue hemodialysis as ordered without interruption.  Avoid nephrotoxic medications and dehydration.  Further plans per nephrology  3. DM type 2 with diabetic peripheral neuropathy ([place]) Continue hypoglycemic medications as already ordered, these medications have been reviewed and there are no changes at this time.  Hgb A1C to be monitored as already arranged by primary service   4. Essential hypertension Continue antihypertensive medications as already ordered, these medications have been reviewed and there are no changes at this time.   5. Coronary artery disease involving native coronary artery of native heart without angina pectoris Continue cardiac and antihypertensive medications as already ordered and reviewed, no changes at this time.  Continue statin as ordered and reviewed, no changes at this time  Nitrates PRN for chest pain   [name], MD  03/08/2021 1:38 PM

## 2021-03-09 ENCOUNTER — Encounter (INDEPENDENT_AMBULATORY_CARE_PROVIDER_SITE_OTHER): Payer: Self-pay | Admitting: Vascular Surgery

## 2021-03-09 ENCOUNTER — Ambulatory Visit (INDEPENDENT_AMBULATORY_CARE_PROVIDER_SITE_OTHER): Payer: Medicare Other | Admitting: Vascular Surgery

## 2021-03-09 ENCOUNTER — Ambulatory Visit (INDEPENDENT_AMBULATORY_CARE_PROVIDER_SITE_OTHER): Payer: Medicare Other

## 2021-03-09 ENCOUNTER — Other Ambulatory Visit: Payer: Self-pay

## 2021-03-09 VITALS — BP 119/81 | HR 90 | Resp 16 | Wt 261.4 lb

## 2021-03-09 DIAGNOSIS — Z992 Dependence on renal dialysis: Secondary | ICD-10-CM

## 2021-03-09 DIAGNOSIS — I10 Essential (primary) hypertension: Secondary | ICD-10-CM | POA: Diagnosis not present

## 2021-03-09 DIAGNOSIS — I2510 Atherosclerotic heart disease of native coronary artery without angina pectoris: Secondary | ICD-10-CM

## 2021-03-09 DIAGNOSIS — T829XXS Unspecified complication of cardiac and vascular prosthetic device, implant and graft, sequela: Secondary | ICD-10-CM | POA: Diagnosis not present

## 2021-03-09 DIAGNOSIS — N186 End stage renal disease: Secondary | ICD-10-CM

## 2021-03-09 DIAGNOSIS — E1142 Type 2 diabetes mellitus with diabetic polyneuropathy: Secondary | ICD-10-CM | POA: Diagnosis not present

## 2021-03-14 ENCOUNTER — Other Ambulatory Visit: Payer: Self-pay | Admitting: Adult Health

## 2021-03-28 ENCOUNTER — Telehealth (INDEPENDENT_AMBULATORY_CARE_PROVIDER_SITE_OTHER): Payer: Self-pay

## 2021-03-28 NOTE — Telephone Encounter (Signed)
Patient called and was scheduled wit Dr. Delana Meyer for a right arm fistulagram on 04/11/21 with a 11:00 am arrival time to the [facility]. Covid testing on 04/07/21 between 8-2 pm at the [facility]. Pre-procedure instructions were discussed and will be mailed. Patient was offered 04/04/21 but declined this date.

## 2021-04-07 ENCOUNTER — Other Ambulatory Visit
Admission: RE | Admit: 2021-04-07 | Discharge: 2021-04-07 | Disposition: A | Discharge status: Home / Self Care | Payer: Medicare Other | Source: Ambulatory Visit | Attending: Vascular Surgery | Admitting: Vascular Surgery

## 2021-04-07 ENCOUNTER — Other Ambulatory Visit: Payer: Self-pay

## 2021-04-07 DIAGNOSIS — Z01812 Encounter for preprocedural laboratory examination: Secondary | ICD-10-CM | POA: Insufficient documentation

## 2021-04-07 DIAGNOSIS — Z20822 Contact with and (suspected) exposure to covid-19: Secondary | ICD-10-CM | POA: Insufficient documentation

## 2021-04-07 LAB — SARS CORONAVIRUS 2 (TAT 6-24 HRS): SARS Coronavirus 2: NEGATIVE

## 2021-04-11 ENCOUNTER — Other Ambulatory Visit (INDEPENDENT_AMBULATORY_CARE_PROVIDER_SITE_OTHER): Payer: Self-pay | Admitting: Nurse Practitioner

## 2021-04-11 ENCOUNTER — Encounter: Payer: Self-pay | Admitting: Vascular Surgery

## 2021-04-11 ENCOUNTER — Other Ambulatory Visit: Payer: Self-pay

## 2021-04-11 ENCOUNTER — Ambulatory Visit
Admission: RE | Admit: 2021-04-11 | Discharge: 2021-04-11 | Disposition: A | Discharge status: Home / Self Care | Payer: Medicare Other | Attending: Vascular Surgery | Admitting: Vascular Surgery

## 2021-04-11 ENCOUNTER — Encounter
Admission: RE | Disposition: A | Discharge status: Home / Self Care | Payer: Self-pay | Source: Home / Self Care | Attending: Vascular Surgery

## 2021-04-11 DIAGNOSIS — Z7982 Long term (current) use of aspirin: Secondary | ICD-10-CM | POA: Insufficient documentation

## 2021-04-11 DIAGNOSIS — I2510 Atherosclerotic heart disease of native coronary artery without angina pectoris: Secondary | ICD-10-CM | POA: Diagnosis not present

## 2021-04-11 DIAGNOSIS — Z79899 Other long term (current) drug therapy: Secondary | ICD-10-CM | POA: Diagnosis not present

## 2021-04-11 DIAGNOSIS — Z794 Long term (current) use of insulin: Secondary | ICD-10-CM | POA: Diagnosis not present

## 2021-04-11 DIAGNOSIS — N186 End stage renal disease: Secondary | ICD-10-CM | POA: Insufficient documentation

## 2021-04-11 DIAGNOSIS — Z7984 Long term (current) use of oral hypoglycemic drugs: Secondary | ICD-10-CM | POA: Diagnosis not present

## 2021-04-11 DIAGNOSIS — Z91013 Allergy to seafood: Secondary | ICD-10-CM | POA: Insufficient documentation

## 2021-04-11 DIAGNOSIS — E1122 Type 2 diabetes mellitus with diabetic chronic kidney disease: Secondary | ICD-10-CM | POA: Diagnosis not present

## 2021-04-11 DIAGNOSIS — Z888 Allergy status to other drugs, medicaments and biological substances status: Secondary | ICD-10-CM | POA: Diagnosis not present

## 2021-04-11 DIAGNOSIS — Z539 Procedure and treatment not carried out, unspecified reason: Secondary | ICD-10-CM | POA: Insufficient documentation

## 2021-04-11 DIAGNOSIS — I120 Hypertensive chronic kidney disease with stage 5 chronic kidney disease or end stage renal disease: Secondary | ICD-10-CM | POA: Insufficient documentation

## 2021-04-11 LAB — GLUCOSE, CAPILLARY: Glucose-Capillary: 145 mg/dL — ABNORMAL HIGH (ref 70–99)

## 2021-04-11 LAB — POTASSIUM (ARMC VASCULAR LAB ONLY): Potassium (ARMC vascular lab): 5.6 — ABNORMAL HIGH (ref 3.5–5.1)

## 2021-04-11 SURGERY — A/V FISTULAGRAM
Anesthesia: Moderate Sedation

## 2021-04-11 MED ORDER — DIPHENHYDRAMINE HCL 50 MG/ML IJ SOLN: 50.0000 mg | Freq: Once | INTRAMUSCULAR | Status: DC | PRN

## 2021-04-11 MED ORDER — CEFAZOLIN SODIUM-DEXTROSE 1-4 GM/50ML-% IV SOLN: 1.0000 g | Freq: Once | INTRAVENOUS | Status: DC

## 2021-04-11 MED ORDER — CEFAZOLIN SODIUM-DEXTROSE 1-4 GM/50ML-% IV SOLN
INTRAVENOUS | Status: AC
  Filled 2021-04-11: qty 50

## 2021-04-11 MED ORDER — FAMOTIDINE 20 MG PO TABS
40.0000 mg | ORAL_TABLET | Freq: Once | ORAL | Status: AC | PRN
  Administered 2021-04-11: 40 mg via ORAL

## 2021-04-11 MED ORDER — DIPHENHYDRAMINE HCL 50 MG/ML IJ SOLN
INTRAMUSCULAR | Status: AC
  Filled 2021-04-11: qty 1

## 2021-04-11 MED ORDER — SODIUM CHLORIDE 0.9 % IV SOLN: INTRAVENOUS | Status: DC

## 2021-04-11 MED ORDER — FAMOTIDINE 20 MG PO TABS
ORAL_TABLET | ORAL | Status: AC
  Filled 2021-04-11: qty 1

## 2021-04-11 MED ORDER — METHYLPREDNISOLONE SODIUM SUCC 125 MG IJ SOLR: 125.0000 mg | Freq: Once | INTRAMUSCULAR | Status: DC | PRN

## 2021-04-11 MED ORDER — MIDAZOLAM HCL 5 MG/5ML IJ SOLN
INTRAMUSCULAR | Status: AC
  Filled 2021-04-11: qty 5

## 2021-04-11 MED ORDER — MIDAZOLAM HCL 2 MG/ML PO SYRP
8.0000 mg | ORAL_SOLUTION | Freq: Once | ORAL | Status: AC | PRN
  Administered 2021-04-11: 8 mg via ORAL

## 2021-04-11 MED ORDER — METHYLPREDNISOLONE SODIUM SUCC 125 MG IJ SOLR
INTRAMUSCULAR | Status: AC
  Filled 2021-04-11: qty 2

## 2021-04-11 MED ORDER — HYDROMORPHONE HCL 1 MG/ML IJ SOLN: 1.0000 mg | Freq: Once | INTRAMUSCULAR | Status: DC | PRN

## 2021-04-11 MED ORDER — FENTANYL CITRATE (PF) 100 MCG/2ML IJ SOLN
INTRAMUSCULAR | Status: AC
  Filled 2021-04-11: qty 2

## 2021-04-11 MED ORDER — MIDAZOLAM HCL 2 MG/ML PO SYRP
ORAL_SOLUTION | ORAL | Status: AC
  Filled 2021-04-11: qty 4

## 2021-04-11 MED ORDER — ONDANSETRON HCL 4 MG/2ML IJ SOLN: 4.0000 mg | Freq: Four times a day (QID) | INTRAMUSCULAR | Status: DC | PRN

## 2021-04-11 MED ORDER — HEPARIN SODIUM (PORCINE) 1000 UNIT/ML IJ SOLN
INTRAMUSCULAR | Status: AC
  Filled 2021-04-11: qty 1

## 2021-04-11 NOTE — Interval H&P Note (Signed)
History and Physical Interval Note:  04/11/2021 11:50 AM  [name]  has presented today for surgery, with the diagnosis of RT Arm Fistulagram   ESRD   SHELLFISH allergy   Covid test on 4-22.  The various methods of treatment have been discussed with the patient and family. After consideration of risks, benefits and other options for treatment, the patient has consented to  Procedure(s): A/V FISTULAGRAM (N/A) as a surgical intervention.  The patient's history has been reviewed, patient examined, no change in status, stable for surgery.  I have reviewed the patient's chart and labs.  Questions were answered to the patient's satisfaction.     [name]

## 2021-04-11 NOTE — CV Procedure (Signed)
Unable to gain IV access in holding area and pt has Shell fish allergy. Pt  Case canceled per Dr Delana Meyer. Monitor pt for two hours per Dr Delana Meyer then DC to home with follow up

## 2021-04-11 NOTE — H&P (Signed)
@LOGO @   MRN : [id number]  [name] is a 55 y.o. (10-23-1966) male who presents with chief complaint of No chief complaint on file. [name] Kitchen  History of Present Illness:   Patient presents to [facility] today for treatment of his left arm AV fistula.  Dialysis center is reported increasing problems.  At the present time he is maintained via a right brachiocephalic fistula.  Per his dialysis center they have having increasing problems with decannulation and prolonged bleeding.  He has also had 3 infiltrations over the past 6 weeks.  The patient denies hand pain or other symptoms consistent with steal phenomena.  No significant arm swelling.  The patient denies redness or swelling at the access site. The patient denies fever or chills at home or while on dialysis.  The patient denies amaurosis fugax or recent TIA symptoms. There are no recent neurological changes noted. The patient denies claudication symptoms or rest pain symptoms. The patient denies history of DVT, PE or superficial thrombophlebitis. The patient denies recent episodes of angina or shortness of breath.   Most recent duplex ultrasound of the AV access shows a patent access. The previously noted stenosis at the cephalic confluence is about the same compared to last study but the volume flow is decreased slightly from 1600 cc/min today is 1011 cc/min.   Current Meds  Medication Sig  . albuterol (PROVENTIL HFA;VENTOLIN HFA) 108 (90 Base) MCG/ACT inhaler Inhale 2 puffs into the lungs every 4 (four) hours as needed for wheezing or shortness of breath.  . allopurinol (ZYLOPRIM) 100 MG tablet Take 200 mg by mouth in the morning.  [name] ELLIPTA 62.5-25 MCG/INH AEPB INHALE 1 PUFF INTO THE LUNGS DAILY. (Patient taking differently: Inhale 1 puff into the lungs daily.)  . aspirin EC 81 MG tablet Take 81 mg by mouth in the morning. Swallow whole.  [name] Kitchen atorvastatin (LIPITOR) 40 MG tablet Take 1 tablet (40 mg  total) by mouth at bedtime. (Patient taking differently: Take 40 mg by mouth in the morning.)  . calcium acetate (PHOSLO) 667 MG capsule Take 667 mg by mouth 3 (three) times daily with meals.  . Cholecalciferol (D3-1000) 25 MCG (1000 UT) tablet Take 2 tablets (2,000 Units total) by mouth daily. (Patient taking differently: Take 1,000 Units by mouth in the morning and at bedtime.)  . cyclobenzaprine (FLEXERIL) 5 MG tablet Take 1 tablet (5 mg total) by mouth 2 (two) times daily as needed for muscle spasms.  . Dulaglutide (TRULICITY) 1.5 HX/5.0VW SOPN Inject 1.5 sq every week (Patient taking differently: Inject 1.5 mg into the skin every Monday. Inject 1.5 sq every week)  . gabapentin (NEURONTIN) 300 MG capsule Take 300 mg by mouth in the morning.  [name] Kitchen GAVILAX 17 GM/SCOOP powder Take 17 g by mouth daily as needed for mild constipation or moderate constipation.   [name] Kitchen glimepiride (AMARYL) 4 MG tablet Take 4 mg by mouth in the morning.  . insulin degludec (TRESIBA FLEXTOUCH) 100 UNIT/ML FlexTouch Pen Inject 20 Units into the skin at bedtime.  . Insulin Degludec (TRESIBA) 100 UNIT/ML SOLN Inject into the skin.  [name] Kitchen lidocaine-prilocaine (EMLA) cream Apply 1 application topically daily as needed (port).   . magnesium oxide (MAG-OX) 400 MG tablet Take 400 mg by mouth 2 (two) times daily.  . Multiple Vitamins-Minerals (TAB-A-VITE) TABS TAKE 1 TABLET BY MOUTH ONCE DAILY. (Patient taking differently: Take 1 tablet by mouth in the morning.)  . omeprazole (PRILOSEC) 20 MG capsule Take 1 capsule (  20 mg total) by mouth 2 (two) times daily before a meal. (Patient taking differently: Take 20 mg by mouth in the morning.)  . Risankizumab-rzaa (SKYRIZI) 150 MG/ML SOSY Inject 150 mg into the skin as directed. Every 12 weeks for maintenance. (Patient taking differently: Inject 150 mg into the skin every 4 (four) months. Every 12 weeks for maintenance.)    Past Medical History:  Diagnosis Date  . Bell's palsy   . Bell's palsy    . Cataract   . CHF (congestive heart failure) ([place])   . Chronic kidney disease    dialysis  . COPD (chronic obstructive pulmonary disease) ([place])   . Coronary artery disease   . Diabetes mellitus without complication ([place])   . Dyspnea    Wheezing  . GERD (gastroesophageal reflux disease)   . Gout   . HOH (hard of hearing)    mild  . Hypercholesterolemia   . Hypertension   . NSVT (nonsustained ventricular tachycardia) ([place])   . Obstructive sleep apnea    CPAP, O2 use continuosly at 3LPM  . Orthopnea   . Oxygen dependent    3 lpm continuous  . Pneumonia    In Past X2 most recent 1 yr ago  . Psoriasis   . Psoriasis   . Pulmonary HTN ([place])   . Renal failure     Past Surgical History:  Procedure Laterality Date  . AV FISTULA PLACEMENT Right 11/20/2019   Procedure: ARTERIOVENOUS (AV) FISTULA CREATION ( BRACHIAL CEPHALIC );  Surgeon: Katha Cabal, MD;  Location: [facility];  Service: Vascular;  Laterality: Right;  . CATARACT EXTRACTION W/PHACO Left 12/17/2019   Procedure: CATARACT EXTRACTION PHACO AND INTRAOCULAR LENS PLACEMENT ([place]) LEFT ISTENT INJ DIABETIC;  Surgeon: Eulogio Bear, MD;  Location: [facility];  Service: Ophthalmology;  Laterality: Left;  [place] 00:33.2 CDE 2.96 Fluid Pack Lot # [id number]  . CATARACT EXTRACTION W/PHACO Right 02/24/2020   Procedure: CATARACT EXTRACTION PHACO AND INTRAOCULAR LENS PLACEMENT ([facility]) RIGHT DIABETIC ISTENT INJ;  Surgeon: Eulogio Bear, MD;  Location: [facility];  Service: Ophthalmology;  Laterality: Right;  [place] 00:37.3 CDE 2.88 Fluid Pack Lot # [id number]  . CORONARY ANGIOPLASTY WITH STENT PLACEMENT     stent placement  . DIALYSIS/PERMA CATHETER INSERTION N/A 06/22/2019   Procedure: DIALYSIS/PERMA CATHETER INSERTION;  Surgeon: Algernon Huxley, MD;  Location: [place] [facility];  Service: Cardiovascular;  Laterality: N/A;  . DIALYSIS/PERMA CATHETER REMOVAL N/A 05/31/2020   Procedure: DIALYSIS/PERMA CATHETER REMOVAL;  Surgeon: Katha Cabal, MD;  Location: [place] [facility];  Service: Cardiovascular;  Laterality: N/A;  . INSERTION OF AHMED VALVE Left 12/17/2019   Procedure: INSERTION OF iSTENT;  Surgeon: Eulogio Bear, MD;  Location: [facility];  Service: Ophthalmology;  Laterality: Left;    Social History Social History   Tobacco Use  . Smoking status: Never Smoker  . Smokeless tobacco: Never Used  Vaping Use  . Vaping Use: Never used  Substance Use Topics  . Alcohol use: Not Currently    Alcohol/week: 1.0 standard drink    Types: 1 Cans of beer per week    Comment: occasional  . Drug use: Not Currently    Types: Marijuana    Comment: in high school     Family History Family History  Problem Relation Age of Onset  . Heart failure Mother   . Kidney failure Brother     Allergies  Allergen Reactions  . Shellfish Allergy Anaphylaxis    Face  and throat swelling, difficulty breathing Allergy can be triggered by touching (contact)  . Other   . Betadine [Povidone Iodine] Rash  . Povidone-Iodine Rash     REVIEW OF SYSTEMS (Negative unless checked)  Constitutional: [] Weight loss  [] Fever  [] Chills Cardiac: [] Chest pain   [] Chest pressure   [] Palpitations   [] Shortness of breath when laying flat   [] Shortness of breath with exertion. Vascular:  [] Pain in legs with walking   [] Pain in legs at rest  [] History of DVT   [] Phlebitis   [x] Swelling in legs   [] Varicose veins   [] Non-healing ulcers Pulmonary:   [] Uses home oxygen   [] Productive cough   [] Hemoptysis   [] Wheeze  [] COPD   [] Asthma Neurologic:  [] Dizziness   [] Seizures   [] History of stroke   [] History of TIA  [] Aphasia   [] Vissual changes   [] Weakness or numbness in arm   [] Weakness or numbness in leg Musculoskeletal:   [] Joint swelling   [] Joint pain   [] Low back pain Hematologic:  [] Easy bruising  [] Easy bleeding   [] Hypercoagulable state   [] Anemic Gastrointestinal:  [] Diarrhea   [] Vomiting  [] Gastroesophageal reflux/heartburn    [] Difficulty swallowing. Genitourinary:  [x] Chronic kidney disease   [] Difficult urination  [] Frequent urination   [] Blood in urine Skin:  [] Rashes   [] Ulcers  Psychological:  [] History of anxiety   []  History of major depression.  Physical Examination  There were no vitals filed for this visit. There is no height or weight on file to calculate BMI. Gen: WD/WN, NAD Head: [place]/AT, No temporalis wasting.  Ear/Nose/Throat: Hearing grossly intact, nares w/o erythema or drainage Eyes: PER, EOMI, sclera nonicteric.  Neck: Supple, no large masses.   Pulmonary:  Good air movement, no audible wheezing bilaterally, no use of accessory muscles.  Cardiac: RRR, no JVD Vascular: Left brachiocephalic fistula markedly pulsatile with a staccato bruit Vessel Right Left  Radial Palpable Palpable  Brachial Palpable Palpable  Gastrointestinal: Non-distended. No guarding/no peritoneal signs.  Musculoskeletal: M/S 5/5 throughout.  No deformity or atrophy.  Neurologic: CN 2-12 intact. Symmetrical.  Speech is fluent. Motor exam as listed above. Psychiatric: Judgment intact, Mood & affect appropriate for pt's clinical situation. Dermatologic: No rashes or ulcers noted.  No changes consistent with cellulitis.   CBC Lab Results  Component Value Date   WBC 6.9 01/30/2021   HGB 9.3 (L) 01/30/2021   HCT 30.0 (L) 01/30/2021   MCV 93.5 01/30/2021   PLT 179 01/30/2021    BMET    Component Value Date/Time   NA 138 01/30/2021 0930   NA 142 04/21/2019 1334   K 3.4 (L) 01/30/2021 0930   CL 100 01/30/2021 0930   CO2 26 01/30/2021 0930   GLUCOSE 129 (H) 01/30/2021 0930   BUN 46 (H) 01/30/2021 0930   BUN 21 04/21/2019 1334   CREATININE 6.39 (H) 01/30/2021 0930   CALCIUM 8.5 (L) 01/30/2021 0930   GFRNONAA 10 (L) 01/30/2021 0930   GFRAA 10 (L) 11/21/2019 1853   CrCl cannot be calculated (Patient's most recent lab result is older than the maximum 21 days allowed.).  COAG Lab Results  Component Value Date    INR 1.1 11/17/2019   INR 1.1 06/15/2019   INR 1.1 02/18/2019    Radiology No results found.   Assessment/Plan 1. Complication from renal dialysis device, sequela Recommend:  The patient is experiencing increasing problems with their right arm dialysis access.  He has had multiple infiltrations over the recent past.  Examination shows marked  pulsatility of the fistula itself in the duplex ultrasound is consistent with a high-grade stenosis at the cephalic confluence.  Patient should have a fistulagram with the intention for intervention.  The intention for intervention is to restore appropriate flow and prevent thrombosis and possible loss of the access.  As well as improve the quality of dialysis therapy.  The risks, benefits and alternative therapies were reviewed in detail with the patient.  All questions were answered.  The patient agrees to proceed with angio/intervention of the right brachial cephalic fistula.    2. End stage renal disease ([place]) At the present time the patient has adequate dialysis access.  Continue hemodialysis as ordered without interruption.  Avoid nephrotoxic medications and dehydration.  Further plans per nephrology  3. DM type 2 with diabetic peripheral neuropathy ([place]) Continue hypoglycemic medications as already ordered, these medications have been reviewed and there are no changes at this time.  Hgb A1C to be monitored as already arranged by primary service   4. Essential hypertension Continue antihypertensive medications as already ordered, these medications have been reviewed and there are no changes at this time.   5. Coronary artery disease involving native coronary artery of native heart without angina pectoris Continue cardiac and antihypertensive medications as already ordered and reviewed, no changes at this time.  Continue statin as ordered and reviewed, no changes at this time  Nitrates PRN for chest pain     [name], MD  04/11/2021 11:46 AM

## 2021-04-11 NOTE — H&P (View-Only) (Signed)
@LOGO @   MRN : [id number]  [name] is a 55 y.o. (19-May-1966) male who presents with chief complaint of No chief complaint on file. [name] Kitchen  History of Present Illness:   Patient presents to [facility] today for treatment of his left arm AV fistula.  Dialysis center is reported increasing problems.  At the present time he is maintained via a right brachiocephalic fistula.  Per his dialysis center they have having increasing problems with decannulation and prolonged bleeding.  He has also had 3 infiltrations over the past 6 weeks.  The patient denies hand pain or other symptoms consistent with steal phenomena.  No significant arm swelling.  The patient denies redness or swelling at the access site. The patient denies fever or chills at home or while on dialysis.  The patient denies amaurosis fugax or recent TIA symptoms. There are no recent neurological changes noted. The patient denies claudication symptoms or rest pain symptoms. The patient denies history of DVT, PE or superficial thrombophlebitis. The patient denies recent episodes of angina or shortness of breath.   Most recent duplex ultrasound of the AV access shows a patent access. The previously noted stenosis at the cephalic confluence is about the same compared to last study but the volume flow is decreased slightly from 1600 cc/min today is 1011 cc/min.   Current Meds  Medication Sig  . albuterol (PROVENTIL HFA;VENTOLIN HFA) 108 (90 Base) MCG/ACT inhaler Inhale 2 puffs into the lungs every 4 (four) hours as needed for wheezing or shortness of breath.  . allopurinol (ZYLOPRIM) 100 MG tablet Take 200 mg by mouth in the morning.  [name] ELLIPTA 62.5-25 MCG/INH AEPB INHALE 1 PUFF INTO THE LUNGS DAILY. (Patient taking differently: Inhale 1 puff into the lungs daily.)  . aspirin EC 81 MG tablet Take 81 mg by mouth in the morning. Swallow whole.  [name] Kitchen atorvastatin (LIPITOR) 40 MG tablet Take 1 tablet (40 mg  total) by mouth at bedtime. (Patient taking differently: Take 40 mg by mouth in the morning.)  . calcium acetate (PHOSLO) 667 MG capsule Take 667 mg by mouth 3 (three) times daily with meals.  . Cholecalciferol (D3-1000) 25 MCG (1000 UT) tablet Take 2 tablets (2,000 Units total) by mouth daily. (Patient taking differently: Take 1,000 Units by mouth in the morning and at bedtime.)  . cyclobenzaprine (FLEXERIL) 5 MG tablet Take 1 tablet (5 mg total) by mouth 2 (two) times daily as needed for muscle spasms.  . Dulaglutide (TRULICITY) 1.5 CV/8.9FY SOPN Inject 1.5 sq every week (Patient taking differently: Inject 1.5 mg into the skin every Monday. Inject 1.5 sq every week)  . gabapentin (NEURONTIN) 300 MG capsule Take 300 mg by mouth in the morning.  [name] Kitchen GAVILAX 17 GM/SCOOP powder Take 17 g by mouth daily as needed for mild constipation or moderate constipation.   [name] Kitchen glimepiride (AMARYL) 4 MG tablet Take 4 mg by mouth in the morning.  . insulin degludec (TRESIBA FLEXTOUCH) 100 UNIT/ML FlexTouch Pen Inject 20 Units into the skin at bedtime.  . Insulin Degludec (TRESIBA) 100 UNIT/ML SOLN Inject into the skin.  [name] Kitchen lidocaine-prilocaine (EMLA) cream Apply 1 application topically daily as needed (port).   . magnesium oxide (MAG-OX) 400 MG tablet Take 400 mg by mouth 2 (two) times daily.  . Multiple Vitamins-Minerals (TAB-A-VITE) TABS TAKE 1 TABLET BY MOUTH ONCE DAILY. (Patient taking differently: Take 1 tablet by mouth in the morning.)  . omeprazole (PRILOSEC) 20 MG capsule Take 1 capsule (  20 mg total) by mouth 2 (two) times daily before a meal. (Patient taking differently: Take 20 mg by mouth in the morning.)  . Risankizumab-rzaa (SKYRIZI) 150 MG/ML SOSY Inject 150 mg into the skin as directed. Every 12 weeks for maintenance. (Patient taking differently: Inject 150 mg into the skin every 4 (four) months. Every 12 weeks for maintenance.)    Past Medical History:  Diagnosis Date  . Bell's palsy   . Bell's palsy    . Cataract   . CHF (congestive heart failure) ([place])   . Chronic kidney disease    dialysis  . COPD (chronic obstructive pulmonary disease) ([place])   . Coronary artery disease   . Diabetes mellitus without complication ([place])   . Dyspnea    Wheezing  . GERD (gastroesophageal reflux disease)   . Gout   . HOH (hard of hearing)    mild  . Hypercholesterolemia   . Hypertension   . NSVT (nonsustained ventricular tachycardia) ([place])   . Obstructive sleep apnea    CPAP, O2 use continuosly at 3LPM  . Orthopnea   . Oxygen dependent    3 lpm continuous  . Pneumonia    In Past X2 most recent 1 yr ago  . Psoriasis   . Psoriasis   . Pulmonary HTN ([place])   . Renal failure     Past Surgical History:  Procedure Laterality Date  . AV FISTULA PLACEMENT Right 11/20/2019   Procedure: ARTERIOVENOUS (AV) FISTULA CREATION ( BRACHIAL CEPHALIC );  Surgeon: Katha Cabal, MD;  Location: [facility] ORS;  Service: Vascular;  Laterality: Right;  . CATARACT EXTRACTION W/PHACO Left 12/17/2019   Procedure: CATARACT EXTRACTION PHACO AND INTRAOCULAR LENS PLACEMENT ([place]) LEFT ISTENT INJ DIABETIC;  Surgeon: Eulogio Bear, MD;  Location: [facility];  Service: Ophthalmology;  Laterality: Left;  [place] 00:33.2 CDE 2.96 Fluid Pack Lot # [id number]  . CATARACT EXTRACTION W/PHACO Right 02/24/2020   Procedure: CATARACT EXTRACTION PHACO AND INTRAOCULAR LENS PLACEMENT (IOC) RIGHT DIABETIC ISTENT INJ;  Surgeon: Eulogio Bear, MD;  Location: [facility] ORS;  Service: Ophthalmology;  Laterality: Right;  [place] 00:37.3 CDE 2.88 Fluid Pack Lot # [id number]  . CORONARY ANGIOPLASTY WITH STENT PLACEMENT     stent placement  . DIALYSIS/PERMA CATHETER INSERTION N/A 06/22/2019   Procedure: DIALYSIS/PERMA CATHETER INSERTION;  Surgeon: Algernon Huxley, MD;  Location: [facility];  Service: Cardiovascular;  Laterality: N/A;  . DIALYSIS/PERMA CATHETER REMOVAL N/A 05/31/2020   Procedure: DIALYSIS/PERMA CATHETER REMOVAL;  Surgeon: Katha Cabal, MD;  Location: [facility];  Service: Cardiovascular;  Laterality: N/A;  . INSERTION OF AHMED VALVE Left 12/17/2019   Procedure: INSERTION OF iSTENT;  Surgeon: Eulogio Bear, MD;  Location: [facility];  Service: Ophthalmology;  Laterality: Left;    Social History Social History   Tobacco Use  . Smoking status: Never Smoker  . Smokeless tobacco: Never Used  Vaping Use  . Vaping Use: Never used  Substance Use Topics  . Alcohol use: Not Currently    Alcohol/week: 1.0 standard drink    Types: 1 Cans of beer per week    Comment: occasional  . Drug use: Not Currently    Types: Marijuana    Comment: in high school     Family History Family History  Problem Relation Age of Onset  . Heart failure Mother   . Kidney failure Brother     Allergies  Allergen Reactions  . Shellfish Allergy Anaphylaxis    Face  and throat swelling, difficulty breathing Allergy can be triggered by touching (contact)  . Other   . Betadine [Povidone Iodine] Rash  . Povidone-Iodine Rash     REVIEW OF SYSTEMS (Negative unless checked)  Constitutional: [] Weight loss  [] Fever  [] Chills Cardiac: [] Chest pain   [] Chest pressure   [] Palpitations   [] Shortness of breath when laying flat   [] Shortness of breath with exertion. Vascular:  [] Pain in legs with walking   [] Pain in legs at rest  [] History of DVT   [] Phlebitis   [x] Swelling in legs   [] Varicose veins   [] Non-healing ulcers Pulmonary:   [] Uses home oxygen   [] Productive cough   [] Hemoptysis   [] Wheeze  [] COPD   [] Asthma Neurologic:  [] Dizziness   [] Seizures   [] History of stroke   [] History of TIA  [] Aphasia   [] Vissual changes   [] Weakness or numbness in arm   [] Weakness or numbness in leg Musculoskeletal:   [] Joint swelling   [] Joint pain   [] Low back pain Hematologic:  [] Easy bruising  [] Easy bleeding   [] Hypercoagulable state   [] Anemic Gastrointestinal:  [] Diarrhea   [] Vomiting  [] Gastroesophageal reflux/heartburn    [] Difficulty swallowing. Genitourinary:  [x] Chronic kidney disease   [] Difficult urination  [] Frequent urination   [] Blood in urine Skin:  [] Rashes   [] Ulcers  Psychological:  [] History of anxiety   []  History of major depression.  Physical Examination  There were no vitals filed for this visit. There is no height or weight on file to calculate BMI. Gen: WD/WN, NAD Head: [place]/AT, No temporalis wasting.  Ear/Nose/Throat: Hearing grossly intact, nares w/o erythema or drainage Eyes: PER, EOMI, sclera nonicteric.  Neck: Supple, no large masses.   Pulmonary:  Good air movement, no audible wheezing bilaterally, no use of accessory muscles.  Cardiac: RRR, no JVD Vascular: Left brachiocephalic fistula markedly pulsatile with a staccato bruit Vessel Right Left  Radial Palpable Palpable  Brachial Palpable Palpable  Gastrointestinal: Non-distended. No guarding/no peritoneal signs.  Musculoskeletal: M/S 5/5 throughout.  No deformity or atrophy.  Neurologic: CN 2-12 intact. Symmetrical.  Speech is fluent. Motor exam as listed above. Psychiatric: Judgment intact, Mood & affect appropriate for pt's clinical situation. Dermatologic: No rashes or ulcers noted.  No changes consistent with cellulitis.   CBC Lab Results  Component Value Date   WBC 6.9 01/30/2021   HGB 9.3 (L) 01/30/2021   HCT 30.0 (L) 01/30/2021   MCV 93.5 01/30/2021   PLT 179 01/30/2021    BMET    Component Value Date/Time   NA 138 01/30/2021 0930   NA 142 04/21/2019 1334   K 3.4 (L) 01/30/2021 0930   CL 100 01/30/2021 0930   CO2 26 01/30/2021 0930   GLUCOSE 129 (H) 01/30/2021 0930   BUN 46 (H) 01/30/2021 0930   BUN 21 04/21/2019 1334   CREATININE 6.39 (H) 01/30/2021 0930   CALCIUM 8.5 (L) 01/30/2021 0930   GFRNONAA 10 (L) 01/30/2021 0930   GFRAA 10 (L) 11/21/2019 1853   CrCl cannot be calculated (Patient's most recent lab result is older than the maximum 21 days allowed.).  COAG Lab Results  Component Value Date    INR 1.1 11/17/2019   INR 1.1 06/15/2019   INR 1.1 02/18/2019    Radiology No results found.   Assessment/Plan 1. Complication from renal dialysis device, sequela Recommend:  The patient is experiencing increasing problems with their right arm dialysis access.  He has had multiple infiltrations over the recent past.  Examination shows marked  pulsatility of the fistula itself in the duplex ultrasound is consistent with a high-grade stenosis at the cephalic confluence.  Patient should have a fistulagram with the intention for intervention.  The intention for intervention is to restore appropriate flow and prevent thrombosis and possible loss of the access.  As well as improve the quality of dialysis therapy.  The risks, benefits and alternative therapies were reviewed in detail with the patient.  All questions were answered.  The patient agrees to proceed with angio/intervention of the right brachial cephalic fistula.    2. End stage renal disease ([place]) At the present time the patient has adequate dialysis access.  Continue hemodialysis as ordered without interruption.  Avoid nephrotoxic medications and dehydration.  Further plans per nephrology  3. DM type 2 with diabetic peripheral neuropathy ([place]) Continue hypoglycemic medications as already ordered, these medications have been reviewed and there are no changes at this time.  Hgb A1C to be monitored as already arranged by primary service   4. Essential hypertension Continue antihypertensive medications as already ordered, these medications have been reviewed and there are no changes at this time.   5. Coronary artery disease involving native coronary artery of native heart without angina pectoris Continue cardiac and antihypertensive medications as already ordered and reviewed, no changes at this time.  Continue statin as ordered and reviewed, no changes at this time  Nitrates PRN for chest pain     [name], MD  04/11/2021 11:46 AM

## 2021-04-14 ENCOUNTER — Telehealth (INDEPENDENT_AMBULATORY_CARE_PROVIDER_SITE_OTHER): Payer: Self-pay

## 2021-04-14 NOTE — Telephone Encounter (Signed)
Spoke with the patient and he has been rescheduled with Dr. Delana Meyer for a right arm fistulagram on 05/02/21 with a 1:00 pm arrival time to the [facility]. Covid testing on 04/28/21 between 8-2 pm at the [facility]. Pre-procedure instructions were discussed and will be mailed.

## 2021-05-02 ENCOUNTER — Other Ambulatory Visit: Payer: Self-pay

## 2021-05-02 ENCOUNTER — Encounter
Admission: RE | Disposition: A | Discharge status: Home / Self Care | Payer: Self-pay | Source: Home / Self Care | Attending: Vascular Surgery

## 2021-05-02 ENCOUNTER — Other Ambulatory Visit (INDEPENDENT_AMBULATORY_CARE_PROVIDER_SITE_OTHER): Payer: Self-pay | Admitting: Nurse Practitioner

## 2021-05-02 ENCOUNTER — Encounter: Payer: Self-pay | Admitting: Vascular Surgery

## 2021-05-02 ENCOUNTER — Ambulatory Visit
Admission: RE | Admit: 2021-05-02 | Discharge: 2021-05-02 | Disposition: A | Discharge status: Home / Self Care | Payer: Medicare Other | Attending: Vascular Surgery | Admitting: Vascular Surgery

## 2021-05-02 DIAGNOSIS — Z7982 Long term (current) use of aspirin: Secondary | ICD-10-CM | POA: Diagnosis not present

## 2021-05-02 DIAGNOSIS — E1122 Type 2 diabetes mellitus with diabetic chronic kidney disease: Secondary | ICD-10-CM | POA: Diagnosis not present

## 2021-05-02 DIAGNOSIS — I2510 Atherosclerotic heart disease of native coronary artery without angina pectoris: Secondary | ICD-10-CM | POA: Diagnosis not present

## 2021-05-02 DIAGNOSIS — Z79899 Other long term (current) drug therapy: Secondary | ICD-10-CM | POA: Insufficient documentation

## 2021-05-02 DIAGNOSIS — Z91013 Allergy to seafood: Secondary | ICD-10-CM | POA: Insufficient documentation

## 2021-05-02 DIAGNOSIS — Y841 Kidney dialysis as the cause of abnormal reaction of the patient, or of later complication, without mention of misadventure at the time of the procedure: Secondary | ICD-10-CM | POA: Insufficient documentation

## 2021-05-02 DIAGNOSIS — Z794 Long term (current) use of insulin: Secondary | ICD-10-CM | POA: Diagnosis not present

## 2021-05-02 DIAGNOSIS — Z7984 Long term (current) use of oral hypoglycemic drugs: Secondary | ICD-10-CM | POA: Diagnosis not present

## 2021-05-02 DIAGNOSIS — Z992 Dependence on renal dialysis: Secondary | ICD-10-CM | POA: Insufficient documentation

## 2021-05-02 DIAGNOSIS — Z888 Allergy status to other drugs, medicaments and biological substances status: Secondary | ICD-10-CM | POA: Insufficient documentation

## 2021-05-02 DIAGNOSIS — I120 Hypertensive chronic kidney disease with stage 5 chronic kidney disease or end stage renal disease: Secondary | ICD-10-CM | POA: Diagnosis not present

## 2021-05-02 DIAGNOSIS — T82858A Stenosis of vascular prosthetic devices, implants and grafts, initial encounter: Secondary | ICD-10-CM | POA: Insufficient documentation

## 2021-05-02 DIAGNOSIS — E1140 Type 2 diabetes mellitus with diabetic neuropathy, unspecified: Secondary | ICD-10-CM | POA: Insufficient documentation

## 2021-05-02 DIAGNOSIS — N185 Chronic kidney disease, stage 5: Secondary | ICD-10-CM | POA: Diagnosis not present

## 2021-05-02 DIAGNOSIS — N186 End stage renal disease: Secondary | ICD-10-CM | POA: Diagnosis not present

## 2021-05-02 DIAGNOSIS — T82898A Other specified complication of vascular prosthetic devices, implants and grafts, initial encounter: Secondary | ICD-10-CM | POA: Diagnosis not present

## 2021-05-02 HISTORY — PX: A/V FISTULAGRAM: CATH118298

## 2021-05-02 LAB — GLUCOSE, CAPILLARY
Glucose-Capillary: 51 mg/dL — ABNORMAL LOW (ref 70–99)
Glucose-Capillary: 64 mg/dL — ABNORMAL LOW (ref 70–99)
Glucose-Capillary: 84 mg/dL (ref 70–99)

## 2021-05-02 LAB — POTASSIUM (ARMC VASCULAR LAB ONLY): Potassium (ARMC vascular lab): 5.8 — ABNORMAL HIGH (ref 3.5–5.1)

## 2021-05-02 SURGERY — A/V FISTULAGRAM
Anesthesia: Moderate Sedation | Laterality: Right

## 2021-05-02 MED ORDER — METHYLPREDNISOLONE SODIUM SUCC 125 MG IJ SOLR: 125.0000 mg | Freq: Once | INTRAMUSCULAR | Status: DC | PRN

## 2021-05-02 MED ORDER — HYDROMORPHONE HCL 1 MG/ML IJ SOLN: 1.0000 mg | Freq: Once | INTRAMUSCULAR | Status: DC | PRN

## 2021-05-02 MED ORDER — MIDAZOLAM HCL 2 MG/2ML IJ SOLN
INTRAMUSCULAR | Status: DC | PRN
  Administered 2021-05-02: 2 mg via INTRAVENOUS

## 2021-05-02 MED ORDER — DIPHENHYDRAMINE HCL 50 MG/ML IJ SOLN
INTRAMUSCULAR | Status: AC
  Filled 2021-05-02: qty 1

## 2021-05-02 MED ORDER — METHYLPREDNISOLONE SODIUM SUCC 125 MG IJ SOLR
INTRAMUSCULAR | Status: AC
  Administered 2021-05-02: 125 mg
  Filled 2021-05-02: qty 2

## 2021-05-02 MED ORDER — FENTANYL CITRATE (PF) 100 MCG/2ML IJ SOLN
INTRAMUSCULAR | Status: DC | PRN
  Administered 2021-05-02 (×2): 50 ug via INTRAVENOUS

## 2021-05-02 MED ORDER — SODIUM CHLORIDE 0.9 % IV SOLN
INTRAVENOUS | Status: AC
  Filled 2021-05-02: qty 10

## 2021-05-02 MED ORDER — SODIUM CHLORIDE 0.9 % IV SOLN
1.0000 g | Freq: Once | INTRAVENOUS | Status: AC
  Administered 2021-05-02: 1 g via INTRAVENOUS

## 2021-05-02 MED ORDER — HEPARIN SODIUM (PORCINE) 1000 UNIT/ML IJ SOLN
INTRAMUSCULAR | Status: DC | PRN
Start: 2021-05-02 — End: 2021-05-02
  Administered 2021-05-02: 4000 [IU] via INTRAVENOUS

## 2021-05-02 MED ORDER — MIDAZOLAM HCL 5 MG/5ML IJ SOLN
INTRAMUSCULAR | Status: AC
  Filled 2021-05-02: qty 5

## 2021-05-02 MED ORDER — IODIXANOL 320 MG/ML IV SOLN
INTRAVENOUS | Status: DC | PRN
  Administered 2021-05-02: 35 mL via INTRAVENOUS

## 2021-05-02 MED ORDER — HEPARIN SODIUM (PORCINE) 1000 UNIT/ML IJ SOLN
INTRAMUSCULAR | Status: AC
  Filled 2021-05-02: qty 1

## 2021-05-02 MED ORDER — DIPHENHYDRAMINE HCL 50 MG/ML IJ SOLN
50.0000 mg | Freq: Once | INTRAMUSCULAR | Status: AC | PRN
  Administered 2021-05-02: 50 mg via INTRAVENOUS

## 2021-05-02 MED ORDER — MIDAZOLAM HCL 2 MG/ML PO SYRP: 8.0000 mg | ORAL_SOLUTION | Freq: Once | ORAL | Status: DC | PRN

## 2021-05-02 MED ORDER — FAMOTIDINE 20 MG PO TABS: 40.0000 mg | ORAL_TABLET | Freq: Once | ORAL | Status: DC | PRN

## 2021-05-02 MED ORDER — GLUCOSE 40 % PO GEL
1.0000 | Freq: Once | ORAL | Status: AC
  Administered 2021-05-02: 31 g via ORAL
  Filled 2021-05-02: qty 1.21

## 2021-05-02 MED ORDER — SODIUM CHLORIDE 0.9 % IV SOLN: INTRAVENOUS | Status: DC

## 2021-05-02 MED ORDER — METHYLPREDNISOLONE SODIUM SUCC 125 MG IJ SOLR: 125.0000 mg | Freq: Once | INTRAMUSCULAR | Status: DC

## 2021-05-02 MED ORDER — FENTANYL CITRATE (PF) 100 MCG/2ML IJ SOLN
INTRAMUSCULAR | Status: AC
  Filled 2021-05-02: qty 2

## 2021-05-02 MED ORDER — ONDANSETRON HCL 4 MG/2ML IJ SOLN: 4.0000 mg | Freq: Four times a day (QID) | INTRAMUSCULAR | Status: DC | PRN

## 2021-05-02 SURGICAL SUPPLY — 16 items
BALLN DORADO 7X40X80 (BALLOONS) ×2
BALLN DORADO 8X60X80 (BALLOONS) ×2
BALLOON DORADO 7X40X80 (BALLOONS) ×1 IMPLANT
BALLOON DORADO 8X60X80 (BALLOONS) ×1 IMPLANT
COVER PROBE U/S 5X48 (MISCELLANEOUS) ×2 IMPLANT
DRAPE BRACHIAL (DRAPES) ×2 IMPLANT
KIT ENCORE 26 ADVANTAGE (KITS) ×2 IMPLANT
KIT MICROPUNCTURE NIT STIFF (SHEATH) ×2 IMPLANT
NEEDLE ENTRY 21GA 7CM ECHOTIP (NEEDLE) ×2 IMPLANT
PACK ANGIOGRAPHY (CUSTOM PROCEDURE TRAY) ×2 IMPLANT
SHEATH BRITE TIP 6FRX5.5 (SHEATH) ×2 IMPLANT
SHEATH BRITE TIP 7FRX5.5 (SHEATH) ×2 IMPLANT
STENT VIABAHN 8X50X120 (Permanent Stent) ×4 IMPLANT
STENT VIABAHN5X120X8X (Permanent Stent) ×2 IMPLANT
SUT MNCRL AB 4-0 PS2 18 (SUTURE) ×2 IMPLANT
WIRE G 018X200 V18 (WIRE) ×2 IMPLANT

## 2021-05-02 NOTE — Progress Notes (Signed)
Blood drawn for K+: 5.8. BG: 65 from blood drawn, then repeat BG 51 from finger stick. Dr. Delana Meyer made aware. New orders received. Pt. Med. With oral glucose gel secondary to no IV access after 3 sticks.

## 2021-05-02 NOTE — Interval H&P Note (Signed)
History and Physical Interval Note:  05/02/2021 2:10 PM  [name]  has presented today for surgery, with the diagnosis of RT arm fistulagram   End Stage Renal Covid  May 13.  The various methods of treatment have been discussed with the patient and family. After consideration of risks, benefits and other options for treatment, the patient has consented to  Procedure(s): A/V FISTULAGRAM (Right) as a surgical intervention.  The patient's history has been reviewed, patient examined, no change in status, stable for surgery.  I have reviewed the patient's chart and labs.  Questions were answered to the patient's satisfaction.     [name]

## 2021-05-02 NOTE — Op Note (Signed)
OPERATIVE NOTE   PROCEDURE: 1. Contrast injection right brachial cephalic AV access 2. Percutaneous transluminal angioplasty and stent placement peripheral segment in 2 locations right brachiocephalic fistula  PRE-OPERATIVE DIAGNOSIS: Complication of dialysis access                                                       End Stage Renal Disease  POST-OPERATIVE DIAGNOSIS: same as above   SURGEON: Katha Cabal, M.D.  ANESTHESIA: Conscious sedation was administered under my direct supervision by the interventional radiology RN. IV Versed plus fentanyl were utilized. Continuous ECG, pulse oximetry and blood pressure was monitored throughout the entire procedure.  Conscious sedation was for a total of 32 minutes and 56 seconds.  ESTIMATED BLOOD LOSS: minimal  FINDING(S): 1. 2 discrete lesions in the peripheral segment both greater than 80%  SPECIMEN(S):  None  CONTRAST: 35 cc  FLUOROSCOPY TIME: 2.9 minutes  INDICATIONS: [name] is a 55 y.o. male who  presents with malfunctioning right arm AV access.  The patient is scheduled for angiography with possible intervention of the AV access.  The patient is aware the risks include but are not limited to: bleeding, infection, thrombosis of the cannulated access, and possible anaphylactic reaction to the contrast.  The patient acknowledges if the access can not be salvaged a tunneled catheter will be needed and will be placed during this procedure.  The patient is aware of the risks of the procedure and elects to proceed with the angiogram and intervention.  DESCRIPTION: After full informed written consent was obtained, the patient was brought back to the Special Procedure suite and placed supine position.  Appropriate cardiopulmonary monitors were placed.  The the right arm was prepped and draped in the standard fashion.  Appropriate timeout is called. The the right brachiocephalic fistula was cannulated with a micropuncture needle.   Cannulation was performed with ultrasound guidance. Ultrasound was placed in a sterile sleeve, the AV access was interrogated and noted to be echolucent and compressible indicating patency. Image was recorded for the permanent record. The puncture is performed under continuous ultrasound visualization.   The microwire was advanced and the needle was exchanged for  a microsheath.  The J-wire was then advanced and a 6 Fr sheath inserted.  Hand injections were completed to image the access from the arterial anastomosis through the entire access.  The central venous structures were also imaged by hand injections.  Diagnostic interpretation: The portion of the fistula that is cannulated is mildly aneurysmal but widely patent.  At the level of the proximal upper arm there is a focal sclerotic area greater than 80% which appears to be a retained valve.  At the cephalic subclavian confluence the cephalic vein is greater than 90% stenotic over a distance of approximately 4 cm.  The subclavian and Ottoman superior vena cava are widely patent.  Based on the images,  3000 units of heparin was given and the sheath was upsized to a 7 [place] sheath.  A wire was negotiated through the strictures within the venous portion of the graft.  An 8 mm x 50 mm Viabahn was deployed across the stenoses and postdilated with an 8 mm [place] balloon.  The second lesion was then imaged and a 8 mm x 60 mm [place] balloon was inflated across the lesion inflation  was to 10 atm for approximately 1 minute.  Follow-up imaging demonstrated significant residual stenosis more than 40% and therefore a 8 mm x 50 mm Viabahn stent was deployed across this lesion and postdilated with a 7 mm x 40 mm [place] balloon.  Follow-up imaging demonstrates complete resolution of the stricture with less than 10% residual stenosis at both locations and rapid flow of contrast through the fistula, the central venous anatomy is preserved.  A 4-0 Monocryl purse-string  suture was sewn around the sheath.  The sheath was removed and light pressure was applied.  A sterile bandage was applied to the puncture site.    COMPLICATIONS: None  CONDITION: [name], M.D [facility] Office: [phone]  05/02/2021 3:29 PM

## 2021-05-03 ENCOUNTER — Encounter: Payer: Self-pay | Admitting: Vascular Surgery

## 2021-05-04 ENCOUNTER — Encounter: Payer: Self-pay | Admitting: Podiatry

## 2021-05-04 ENCOUNTER — Other Ambulatory Visit: Payer: Self-pay

## 2021-05-04 ENCOUNTER — Ambulatory Visit (INDEPENDENT_AMBULATORY_CARE_PROVIDER_SITE_OTHER): Payer: Medicare Other | Admitting: Podiatry

## 2021-05-04 DIAGNOSIS — E1142 Type 2 diabetes mellitus with diabetic polyneuropathy: Secondary | ICD-10-CM

## 2021-05-04 DIAGNOSIS — M79676 Pain in unspecified toe(s): Secondary | ICD-10-CM

## 2021-05-04 DIAGNOSIS — N186 End stage renal disease: Secondary | ICD-10-CM

## 2021-05-04 DIAGNOSIS — I872 Venous insufficiency (chronic) (peripheral): Secondary | ICD-10-CM

## 2021-05-04 DIAGNOSIS — B351 Tinea unguium: Secondary | ICD-10-CM

## 2021-05-04 DIAGNOSIS — L409 Psoriasis, unspecified: Secondary | ICD-10-CM

## 2021-05-04 NOTE — Progress Notes (Signed)
This patient returns to my office for at risk foot care.  This patient requires this care by a professional since this patient will be at risk due to having diabetes mellitus and ESRD.  This patient is unable to cut nails himself since the patient cannot reach his nails.These nails are painful walking and wearing shoes.  This patient presents for at risk foot care today. ° °General Appearance  Alert, conversant and in no acute stress. ° °Vascular  Dorsalis pedis and posterior tibial  pulses are weakly  palpable due to swelling. bilaterally.  Capillary return is within normal limits  bilaterally. Temperature is within normal limits  Bilaterally.  Venous stasis  B/L. ° °Neurologic  Senn-Weinstein monofilament wire test within normal limits  bilaterally. Muscle power within normal limits bilaterally. ° °Nails Thick disfigured discolored nails with subungual debris  from hallux to fifth toes bilaterally. No evidence of bacterial infection or drainage bilaterally. ° °Orthopedic  No limitations of motion  feet .  No crepitus or effusions noted.  No bony pathology or digital deformities noted. ° °Skin  normotropic skin with no porokeratosis noted bilaterally.  No signs of infections or ulcers noted.  Punctate psoriasis plantar aspect feet  B/l.  ° °Onychomycosis  Pain in right toes  Pain in left toes ° °Consent was obtained for treatment procedures.   Mechanical debridement of nails 1-5  bilaterally performed with a nail nipper.  Filed with dremel without incident.  ° ° °Return office visit  10 weeks                   Told patient to return for periodic foot care and evaluation due to potential at risk complications. ° ° °Sharah Finnell DPM  °

## 2021-05-08 ENCOUNTER — Emergency Department: Payer: Medicare Other

## 2021-05-08 ENCOUNTER — Inpatient Hospital Stay: Payer: Medicare Other

## 2021-05-08 ENCOUNTER — Other Ambulatory Visit: Payer: Self-pay

## 2021-05-08 ENCOUNTER — Inpatient Hospital Stay
Admission: EM | Admit: 2021-05-08 | Discharge: 2021-05-19 | DRG: 207 | Disposition: A | Discharge status: Home Health | Payer: Medicare Other | Attending: Internal Medicine | Admitting: Internal Medicine

## 2021-05-08 DIAGNOSIS — I2510 Atherosclerotic heart disease of native coronary artery without angina pectoris: Secondary | ICD-10-CM | POA: Diagnosis present

## 2021-05-08 DIAGNOSIS — Z4659 Encounter for fitting and adjustment of other gastrointestinal appliance and device: Secondary | ICD-10-CM

## 2021-05-08 DIAGNOSIS — E1122 Type 2 diabetes mellitus with diabetic chronic kidney disease: Secondary | ICD-10-CM | POA: Diagnosis present

## 2021-05-08 DIAGNOSIS — I4901 Ventricular fibrillation: Secondary | ICD-10-CM | POA: Diagnosis not present

## 2021-05-08 DIAGNOSIS — Y92009 Unspecified place in unspecified non-institutional (private) residence as the place of occurrence of the external cause: Secondary | ICD-10-CM | POA: Diagnosis not present

## 2021-05-08 DIAGNOSIS — W19XXXA Unspecified fall, initial encounter: Secondary | ICD-10-CM | POA: Diagnosis not present

## 2021-05-08 DIAGNOSIS — H9190 Unspecified hearing loss, unspecified ear: Secondary | ICD-10-CM | POA: Diagnosis present

## 2021-05-08 DIAGNOSIS — I248 Other forms of acute ischemic heart disease: Secondary | ICD-10-CM | POA: Diagnosis present

## 2021-05-08 DIAGNOSIS — J690 Pneumonitis due to inhalation of food and vomit: Secondary | ICD-10-CM | POA: Diagnosis present

## 2021-05-08 DIAGNOSIS — D72829 Elevated white blood cell count, unspecified: Secondary | ICD-10-CM | POA: Diagnosis not present

## 2021-05-08 DIAGNOSIS — J9601 Acute respiratory failure with hypoxia: Secondary | ICD-10-CM

## 2021-05-08 DIAGNOSIS — J9622 Acute and chronic respiratory failure with hypercapnia: Secondary | ICD-10-CM | POA: Diagnosis present

## 2021-05-08 DIAGNOSIS — M1A9XX0 Chronic gout, unspecified, without tophus (tophi): Secondary | ICD-10-CM | POA: Diagnosis present

## 2021-05-08 DIAGNOSIS — Z79899 Other long term (current) drug therapy: Secondary | ICD-10-CM

## 2021-05-08 DIAGNOSIS — R778 Other specified abnormalities of plasma proteins: Secondary | ICD-10-CM

## 2021-05-08 DIAGNOSIS — G9340 Encephalopathy, unspecified: Secondary | ICD-10-CM | POA: Diagnosis present

## 2021-05-08 DIAGNOSIS — R4182 Altered mental status, unspecified: Secondary | ICD-10-CM | POA: Diagnosis present

## 2021-05-08 DIAGNOSIS — I252 Old myocardial infarction: Secondary | ICD-10-CM

## 2021-05-08 DIAGNOSIS — I469 Cardiac arrest, cause unspecified: Secondary | ICD-10-CM | POA: Diagnosis not present

## 2021-05-08 DIAGNOSIS — Z8249 Family history of ischemic heart disease and other diseases of the circulatory system: Secondary | ICD-10-CM

## 2021-05-08 DIAGNOSIS — I10 Essential (primary) hypertension: Secondary | ICD-10-CM | POA: Diagnosis not present

## 2021-05-08 DIAGNOSIS — Z7984 Long term (current) use of oral hypoglycemic drugs: Secondary | ICD-10-CM

## 2021-05-08 DIAGNOSIS — Z992 Dependence on renal dialysis: Secondary | ICD-10-CM | POA: Diagnosis not present

## 2021-05-08 DIAGNOSIS — Z841 Family history of disorders of kidney and ureter: Secondary | ICD-10-CM

## 2021-05-08 DIAGNOSIS — E785 Hyperlipidemia, unspecified: Secondary | ICD-10-CM

## 2021-05-08 DIAGNOSIS — M1A09X0 Idiopathic chronic gout, multiple sites, without tophus (tophi): Secondary | ICD-10-CM | POA: Diagnosis present

## 2021-05-08 DIAGNOSIS — G928 Other toxic encephalopathy: Secondary | ICD-10-CM | POA: Diagnosis present

## 2021-05-08 DIAGNOSIS — Z6841 Body Mass Index (BMI) 40.0 and over, adult: Secondary | ICD-10-CM | POA: Diagnosis not present

## 2021-05-08 DIAGNOSIS — I272 Pulmonary hypertension, unspecified: Secondary | ICD-10-CM | POA: Diagnosis present

## 2021-05-08 DIAGNOSIS — Z794 Long term (current) use of insulin: Secondary | ICD-10-CM

## 2021-05-08 DIAGNOSIS — J9611 Chronic respiratory failure with hypoxia: Secondary | ICD-10-CM | POA: Diagnosis not present

## 2021-05-08 DIAGNOSIS — E1151 Type 2 diabetes mellitus with diabetic peripheral angiopathy without gangrene: Secondary | ICD-10-CM | POA: Diagnosis present

## 2021-05-08 DIAGNOSIS — J9621 Acute and chronic respiratory failure with hypoxia: Secondary | ICD-10-CM | POA: Diagnosis present

## 2021-05-08 DIAGNOSIS — J449 Chronic obstructive pulmonary disease, unspecified: Secondary | ICD-10-CM | POA: Diagnosis not present

## 2021-05-08 DIAGNOSIS — I472 Ventricular tachycardia: Secondary | ICD-10-CM | POA: Diagnosis present

## 2021-05-08 DIAGNOSIS — J441 Chronic obstructive pulmonary disease with (acute) exacerbation: Secondary | ICD-10-CM | POA: Diagnosis present

## 2021-05-08 DIAGNOSIS — I509 Heart failure, unspecified: Secondary | ICD-10-CM

## 2021-05-08 DIAGNOSIS — I5032 Chronic diastolic (congestive) heart failure: Secondary | ICD-10-CM

## 2021-05-08 DIAGNOSIS — N186 End stage renal disease: Secondary | ICD-10-CM | POA: Diagnosis not present

## 2021-05-08 DIAGNOSIS — I5043 Acute on chronic combined systolic (congestive) and diastolic (congestive) heart failure: Secondary | ICD-10-CM | POA: Diagnosis present

## 2021-05-08 DIAGNOSIS — E1129 Type 2 diabetes mellitus with other diabetic kidney complication: Secondary | ICD-10-CM | POA: Diagnosis present

## 2021-05-08 DIAGNOSIS — Z91013 Allergy to seafood: Secondary | ICD-10-CM

## 2021-05-08 DIAGNOSIS — Z7189 Other specified counseling: Secondary | ICD-10-CM | POA: Diagnosis not present

## 2021-05-08 DIAGNOSIS — Z452 Encounter for adjustment and management of vascular access device: Secondary | ICD-10-CM

## 2021-05-08 DIAGNOSIS — Z20822 Contact with and (suspected) exposure to covid-19: Secondary | ICD-10-CM | POA: Diagnosis present

## 2021-05-08 DIAGNOSIS — I132 Hypertensive heart and chronic kidney disease with heart failure and with stage 5 chronic kidney disease, or end stage renal disease: Secondary | ICD-10-CM | POA: Diagnosis present

## 2021-05-08 DIAGNOSIS — D631 Anemia in chronic kidney disease: Secondary | ICD-10-CM | POA: Diagnosis present

## 2021-05-08 DIAGNOSIS — N2581 Secondary hyperparathyroidism of renal origin: Secondary | ICD-10-CM | POA: Diagnosis present

## 2021-05-08 DIAGNOSIS — I255 Ischemic cardiomyopathy: Secondary | ICD-10-CM | POA: Diagnosis present

## 2021-05-08 DIAGNOSIS — S0012XA Contusion of left eyelid and periocular area, initial encounter: Secondary | ICD-10-CM | POA: Diagnosis present

## 2021-05-08 DIAGNOSIS — Z01818 Encounter for other preprocedural examination: Secondary | ICD-10-CM

## 2021-05-08 DIAGNOSIS — G9341 Metabolic encephalopathy: Secondary | ICD-10-CM | POA: Diagnosis not present

## 2021-05-08 DIAGNOSIS — G4733 Obstructive sleep apnea (adult) (pediatric): Secondary | ICD-10-CM | POA: Diagnosis present

## 2021-05-08 DIAGNOSIS — Z7982 Long term (current) use of aspirin: Secondary | ICD-10-CM

## 2021-05-08 DIAGNOSIS — Z955 Presence of coronary angioplasty implant and graft: Secondary | ICD-10-CM

## 2021-05-08 DIAGNOSIS — L409 Psoriasis, unspecified: Secondary | ICD-10-CM | POA: Diagnosis present

## 2021-05-08 DIAGNOSIS — E7800 Pure hypercholesterolemia, unspecified: Secondary | ICD-10-CM | POA: Diagnosis present

## 2021-05-08 DIAGNOSIS — R7989 Other specified abnormal findings of blood chemistry: Secondary | ICD-10-CM | POA: Diagnosis present

## 2021-05-08 DIAGNOSIS — E875 Hyperkalemia: Secondary | ICD-10-CM | POA: Diagnosis not present

## 2021-05-08 DIAGNOSIS — K219 Gastro-esophageal reflux disease without esophagitis: Secondary | ICD-10-CM | POA: Diagnosis present

## 2021-05-08 DIAGNOSIS — R401 Stupor: Secondary | ICD-10-CM

## 2021-05-08 DIAGNOSIS — Z9981 Dependence on supplemental oxygen: Secondary | ICD-10-CM

## 2021-05-08 DIAGNOSIS — Z515 Encounter for palliative care: Secondary | ICD-10-CM | POA: Diagnosis not present

## 2021-05-08 DIAGNOSIS — N19 Unspecified kidney failure: Secondary | ICD-10-CM | POA: Diagnosis not present

## 2021-05-08 DIAGNOSIS — E1169 Type 2 diabetes mellitus with other specified complication: Secondary | ICD-10-CM | POA: Diagnosis present

## 2021-05-08 DIAGNOSIS — E119 Type 2 diabetes mellitus without complications: Secondary | ICD-10-CM

## 2021-05-08 DIAGNOSIS — Z888 Allergy status to other drugs, medicaments and biological substances status: Secondary | ICD-10-CM

## 2021-05-08 LAB — CBC WITH DIFFERENTIAL/PLATELET
Abs Immature Granulocytes: 0.06 10*3/uL (ref 0.00–0.07)
Basophils Absolute: 0 10*3/uL (ref 0.0–0.1)
Basophils Relative: 0 %
Eosinophils Absolute: 0.1 10*3/uL (ref 0.0–0.5)
Eosinophils Relative: 0 %
HCT: 36.9 % — ABNORMAL LOW (ref 39.0–52.0)
Hemoglobin: 11.6 g/dL — ABNORMAL LOW (ref 13.0–17.0)
Immature Granulocytes: 0 %
Lymphocytes Relative: 11 %
Lymphs Abs: 1.5 10*3/uL (ref 0.7–4.0)
MCH: 28.8 pg (ref 26.0–34.0)
MCHC: 31.4 g/dL (ref 30.0–36.0)
MCV: 91.6 fL (ref 80.0–100.0)
Monocytes Absolute: 0.9 10*3/uL (ref 0.1–1.0)
Monocytes Relative: 6 %
Neutro Abs: 11.3 10*3/uL — ABNORMAL HIGH (ref 1.7–7.7)
Neutrophils Relative %: 83 %
Platelets: 226 10*3/uL (ref 150–400)
RBC: 4.03 MIL/uL — ABNORMAL LOW (ref 4.22–5.81)
RDW: 14.7 % (ref 11.5–15.5)
WBC: 13.8 10*3/uL — ABNORMAL HIGH (ref 4.0–10.5)
nRBC: 0 % (ref 0.0–0.2)

## 2021-05-08 LAB — TROPONIN I (HIGH SENSITIVITY)
Troponin I (High Sensitivity): 129 ng/L (ref <18)
Troponin I (High Sensitivity): 143 ng/L (ref <18)
Troponin I (High Sensitivity): 103 ng/L (ref <18)
Troponin I (High Sensitivity): 86 ng/L — ABNORMAL HIGH (ref <18)

## 2021-05-08 LAB — BLOOD GAS, VENOUS
Acid-base deficit: 9.1 mmol/L — ABNORMAL HIGH (ref 0.0–2.0)
Bicarbonate: 20.6 mmol/L (ref 20.0–28.0)
O2 Saturation: 74.2 %
Patient temperature: 37
pCO2, Ven: 62 mmHg — ABNORMAL HIGH (ref 44.0–60.0)
pH, Ven: 7.13 — CL (ref 7.250–7.430)
pO2, Ven: 53 mmHg — ABNORMAL HIGH (ref 32.0–45.0)

## 2021-05-08 LAB — COMPREHENSIVE METABOLIC PANEL
ALT: 16 U/L (ref 0–44)
AST: 13 U/L — ABNORMAL LOW (ref 15–41)
Albumin: 4.3 g/dL (ref 3.5–5.0)
Alkaline Phosphatase: 145 U/L — ABNORMAL HIGH (ref 38–126)
Anion gap: 16 — ABNORMAL HIGH (ref 5–15)
BUN: 94 mg/dL — ABNORMAL HIGH (ref 6–20)
CO2: 20 mmol/L — ABNORMAL LOW (ref 22–32)
Calcium: 8.7 mg/dL — ABNORMAL LOW (ref 8.9–10.3)
Chloride: 99 mmol/L (ref 98–111)
Creatinine, Ser: 13.79 mg/dL — ABNORMAL HIGH (ref 0.61–1.24)
GFR, Estimated: 4 mL/min — ABNORMAL LOW (ref >60)
Glucose, Bld: 135 mg/dL — ABNORMAL HIGH (ref 70–99)
Potassium: 7.2 mmol/L (ref 3.5–5.1)
Sodium: 135 mmol/L (ref 135–145)
Total Bilirubin: 0.9 mg/dL (ref 0.3–1.2)
Total Protein: 8.6 g/dL — ABNORMAL HIGH (ref 6.5–8.1)

## 2021-05-08 LAB — BASIC METABOLIC PANEL
Anion gap: 15 (ref 5–15)
BUN: 61 mg/dL — ABNORMAL HIGH (ref 6–20)
CO2: 25 mmol/L (ref 22–32)
Calcium: 8.3 mg/dL — ABNORMAL LOW (ref 8.9–10.3)
Chloride: 96 mmol/L — ABNORMAL LOW (ref 98–111)
Creatinine, Ser: 9.26 mg/dL — ABNORMAL HIGH (ref 0.61–1.24)
GFR, Estimated: 6 mL/min — ABNORMAL LOW (ref >60)
Glucose, Bld: 95 mg/dL (ref 70–99)
Potassium: 5.9 mmol/L — ABNORMAL HIGH (ref 3.5–5.1)
Sodium: 136 mmol/L (ref 135–145)

## 2021-05-08 LAB — MRSA PCR SCREENING: MRSA by PCR: NEGATIVE

## 2021-05-08 LAB — CBG MONITORING, ED: Glucose-Capillary: 111 mg/dL — ABNORMAL HIGH (ref 70–99)

## 2021-05-08 LAB — BLOOD GAS, ARTERIAL
Acid-base deficit: 2.2 mmol/L — ABNORMAL HIGH (ref 0.0–2.0)
Bicarbonate: 25.3 mmol/L (ref 20.0–28.0)
FIO2: 0.8
MECHVT: 450 mL
Mechanical Rate: 20
O2 Saturation: 87.7 %
PEEP: 12 cmH2O
Patient temperature: 37
pCO2 arterial: 55 mmHg — ABNORMAL HIGH (ref 32.0–48.0)
pH, Arterial: 7.27 — ABNORMAL LOW (ref 7.350–7.450)
pO2, Arterial: 62 mmHg — ABNORMAL LOW (ref 83.0–108.0)

## 2021-05-08 LAB — AMMONIA: Ammonia: 25 umol/L (ref 9–35)

## 2021-05-08 LAB — RESP PANEL BY RT-PCR (FLU A&B, COVID) ARPGX2
Influenza A by PCR: NEGATIVE
Influenza B by PCR: NEGATIVE
SARS Coronavirus 2 by RT PCR: NEGATIVE

## 2021-05-08 LAB — GLUCOSE, CAPILLARY
Glucose-Capillary: 92 mg/dL (ref 70–99)
Glucose-Capillary: 96 mg/dL (ref 70–99)

## 2021-05-08 MED ORDER — ROCURONIUM BROMIDE 50 MG/5ML IV SOLN
1.0000 mg/kg | Freq: Once | INTRAVENOUS | Status: AC
  Administered 2021-05-08: 123.8 mg via INTRAVENOUS
  Filled 2021-05-08: qty 12.38

## 2021-05-08 MED ORDER — SODIUM BICARBONATE 8.4 % IV SOLN
50.0000 meq | Freq: Once | INTRAVENOUS | Status: AC
  Administered 2021-05-08: 50 meq via INTRAVENOUS
  Filled 2021-05-08: qty 50

## 2021-05-08 MED ORDER — INSULIN ASPART 100 UNIT/ML IJ SOLN: 0.0000 [IU] | Freq: Every day | INTRAMUSCULAR | Status: DC

## 2021-05-08 MED ORDER — PANTOPRAZOLE SODIUM 40 MG IV SOLR
40.0000 mg | INTRAVENOUS | Status: DC
  Administered 2021-05-09 – 2021-05-18 (×10): 40 mg via INTRAVENOUS
  Filled 2021-05-08 (×10): qty 40

## 2021-05-08 MED ORDER — SODIUM CHLORIDE 0.9% FLUSH: 3.0000 mL | INTRAVENOUS | Status: DC | PRN

## 2021-05-08 MED ORDER — CHLORHEXIDINE GLUCONATE 0.12% ORAL RINSE (MEDLINE KIT)
15.0000 mL | Freq: Two times a day (BID) | OROMUCOSAL | Status: DC
  Administered 2021-05-08 – 2021-05-18 (×16): 15 mL via OROMUCOSAL

## 2021-05-08 MED ORDER — CARVEDILOL 25 MG PO TABS: 25.0000 mg | ORAL_TABLET | Freq: Two times a day (BID) | ORAL | Status: DC

## 2021-05-08 MED ORDER — PANTOPRAZOLE SODIUM 40 MG PO TBEC: 40.0000 mg | DELAYED_RELEASE_TABLET | Freq: Every day | ORAL | Status: DC

## 2021-05-08 MED ORDER — ARFORMOTEROL TARTRATE 15 MCG/2ML IN NEBU
15.0000 ug | INHALATION_SOLUTION | Freq: Two times a day (BID) | RESPIRATORY_TRACT | Status: DC
  Administered 2021-05-08 – 2021-05-10 (×4): 15 ug via RESPIRATORY_TRACT
  Filled 2021-05-08 (×5): qty 2

## 2021-05-08 MED ORDER — DEXMEDETOMIDINE HCL IN NACL 400 MCG/100ML IV SOLN
0.0000 ug/kg/h | INTRAVENOUS | Status: DC
  Administered 2021-05-08: 0.4 ug/kg/h via INTRAVENOUS
  Administered 2021-05-09: 1 ug/kg/h via INTRAVENOUS
  Administered 2021-05-09: 0.8 ug/kg/h via INTRAVENOUS
  Administered 2021-05-09: 0.9 ug/kg/h via INTRAVENOUS
  Administered 2021-05-09: 0.8 ug/kg/h via INTRAVENOUS
  Administered 2021-05-09: 0.9 ug/kg/h via INTRAVENOUS
  Administered 2021-05-09: 0.8 ug/kg/h via INTRAVENOUS
  Filled 2021-05-08 (×10): qty 100

## 2021-05-08 MED ORDER — ONDANSETRON HCL 4 MG/2ML IJ SOLN: 4.0000 mg | Freq: Four times a day (QID) | INTRAMUSCULAR | Status: DC | PRN

## 2021-05-08 MED ORDER — SODIUM CHLORIDE 0.9% FLUSH
3.0000 mL | Freq: Two times a day (BID) | INTRAVENOUS | Status: DC
  Administered 2021-05-08 – 2021-05-15 (×14): 3 mL via INTRAVENOUS

## 2021-05-08 MED ORDER — HEPARIN SODIUM (PORCINE) 5000 UNIT/ML IJ SOLN
5000.0000 [IU] | Freq: Three times a day (TID) | INTRAMUSCULAR | Status: DC
  Administered 2021-05-08 – 2021-05-19 (×32): 5000 [IU] via SUBCUTANEOUS
  Filled 2021-05-08 (×32): qty 1

## 2021-05-08 MED ORDER — INSULIN DEGLUDEC 100 UNIT/ML ~~LOC~~ SOPN: 10.0000 [IU] | PEN_INJECTOR | Freq: Every day | SUBCUTANEOUS | Status: DC

## 2021-05-08 MED ORDER — FUROSEMIDE 20 MG PO TABS: 80.0000 mg | ORAL_TABLET | Freq: Every day | ORAL | Status: DC

## 2021-05-08 MED ORDER — MIDAZOLAM HCL 2 MG/2ML IJ SOLN
2.0000 mg | INTRAMUSCULAR | Status: AC | PRN
  Administered 2021-05-08 – 2021-05-09 (×3): 2 mg via INTRAVENOUS
  Filled 2021-05-08 (×3): qty 2

## 2021-05-08 MED ORDER — HYDRALAZINE HCL 20 MG/ML IJ SOLN: 5.0000 mg | INTRAMUSCULAR | Status: DC | PRN

## 2021-05-08 MED ORDER — FUROSEMIDE 20 MG PO TABS
80.0000 mg | ORAL_TABLET | Freq: Every day | ORAL | Status: DC
  Administered 2021-05-09 – 2021-05-14 (×6): 80 mg
  Filled 2021-05-08 (×6): qty 4

## 2021-05-08 MED ORDER — INSULIN ASPART 100 UNIT/ML IJ SOLN
10.0000 [IU] | Freq: Once | INTRAMUSCULAR | Status: AC
  Administered 2021-05-08: 10 [IU] via INTRAVENOUS
  Filled 2021-05-08: qty 1

## 2021-05-08 MED ORDER — HYDROMORPHONE HCL 1 MG/ML IJ SOLN
1.0000 mg | Freq: Once | INTRAMUSCULAR | Status: AC
  Administered 2021-05-08: 1 mg via INTRAVENOUS
  Filled 2021-05-08: qty 1

## 2021-05-08 MED ORDER — INSULIN ASPART 100 UNIT/ML IJ SOLN
0.0000 [IU] | INTRAMUSCULAR | Status: DC
  Administered 2021-05-10: 2 [IU] via SUBCUTANEOUS
  Administered 2021-05-10: 4 [IU] via SUBCUTANEOUS
  Administered 2021-05-10: 5 [IU] via SUBCUTANEOUS
  Administered 2021-05-10: 3 [IU] via SUBCUTANEOUS
  Administered 2021-05-11 (×2): 1 [IU] via SUBCUTANEOUS
  Administered 2021-05-11: 2 [IU] via SUBCUTANEOUS
  Administered 2021-05-11: 1 [IU] via SUBCUTANEOUS
  Administered 2021-05-11: 3 [IU] via SUBCUTANEOUS
  Administered 2021-05-12 (×3): 2 [IU] via SUBCUTANEOUS
  Filled 2021-05-08 (×12): qty 1

## 2021-05-08 MED ORDER — SODIUM ZIRCONIUM CYCLOSILICATE 5 G PO PACK
10.0000 g | PACK | Freq: Once | ORAL | Status: AC
  Administered 2021-05-08: 10 g
  Filled 2021-05-08: qty 2

## 2021-05-08 MED ORDER — INSULIN ASPART 100 UNIT/ML IJ SOLN: 0.0000 [IU] | Freq: Three times a day (TID) | INTRAMUSCULAR | Status: DC

## 2021-05-08 MED ORDER — FENTANYL 2500MCG IN NS 250ML (10MCG/ML) PREMIX INFUSION
50.0000 ug/h | INTRAVENOUS | Status: DC
  Administered 2021-05-08: 50 ug/h via INTRAVENOUS
  Administered 2021-05-09: 100 ug/h via INTRAVENOUS
  Administered 2021-05-10: 125 ug/h via INTRAVENOUS
  Administered 2021-05-11: 150 ug/h via INTRAVENOUS
  Administered 2021-05-11 – 2021-05-13 (×4): 200 ug/h via INTRAVENOUS
  Administered 2021-05-14: 150 ug/h via INTRAVENOUS
  Filled 2021-05-08 (×8): qty 250

## 2021-05-08 MED ORDER — ASPIRIN EC 81 MG PO TBEC: 81.0000 mg | DELAYED_RELEASE_TABLET | Freq: Every morning | ORAL | Status: DC

## 2021-05-08 MED ORDER — ALBUTEROL SULFATE HFA 108 (90 BASE) MCG/ACT IN AERS
2.0000 | INHALATION_SPRAY | RESPIRATORY_TRACT | Status: DC | PRN
  Filled 2021-05-08: qty 6.7

## 2021-05-08 MED ORDER — ORAL CARE MOUTH RINSE
15.0000 mL | OROMUCOSAL | Status: DC
  Administered 2021-05-08 – 2021-05-16 (×67): 15 mL via OROMUCOSAL

## 2021-05-08 MED ORDER — ATORVASTATIN CALCIUM 20 MG PO TABS
40.0000 mg | ORAL_TABLET | Freq: Every morning | ORAL | Status: DC
  Administered 2021-05-09 – 2021-05-14 (×6): 40 mg
  Filled 2021-05-08 (×6): qty 2

## 2021-05-08 MED ORDER — CHLORHEXIDINE GLUCONATE CLOTH 2 % EX PADS
6.0000 | MEDICATED_PAD | Freq: Every day | CUTANEOUS | Status: DC
  Administered 2021-05-09 – 2021-05-19 (×11): 6 via TOPICAL
  Filled 2021-05-08: qty 6

## 2021-05-08 MED ORDER — ATORVASTATIN CALCIUM 20 MG PO TABS: 40.0000 mg | ORAL_TABLET | Freq: Every morning | ORAL | Status: DC

## 2021-05-08 MED ORDER — DOCUSATE SODIUM 100 MG PO CAPS: 100.0000 mg | ORAL_CAPSULE | Freq: Two times a day (BID) | ORAL | Status: DC | PRN

## 2021-05-08 MED ORDER — CYCLOBENZAPRINE HCL 5 MG PO TABS
5.0000 mg | ORAL_TABLET | Freq: Two times a day (BID) | ORAL | Status: DC | PRN
  Filled 2021-05-08: qty 1

## 2021-05-08 MED ORDER — MIDAZOLAM HCL 2 MG/2ML IJ SOLN
2.0000 mg | INTRAMUSCULAR | Status: DC | PRN
  Administered 2021-05-09 – 2021-05-12 (×5): 2 mg via INTRAVENOUS
  Filled 2021-05-08 (×3): qty 2

## 2021-05-08 MED ORDER — REVEFENACIN 175 MCG/3ML IN SOLN
175.0000 ug | Freq: Every day | RESPIRATORY_TRACT | Status: DC
  Administered 2021-05-09 – 2021-05-10 (×2): 175 ug via RESPIRATORY_TRACT
  Filled 2021-05-08 (×3): qty 3

## 2021-05-08 MED ORDER — FAMOTIDINE IN NACL 20-0.9 MG/50ML-% IV SOLN: 20.0000 mg | Freq: Two times a day (BID) | INTRAVENOUS | Status: DC

## 2021-05-08 MED ORDER — SODIUM ZIRCONIUM CYCLOSILICATE 10 G PO PACK
10.0000 g | PACK | Freq: Once | ORAL | Status: AC
  Administered 2021-05-08: 10 g via ORAL
  Filled 2021-05-08: qty 1

## 2021-05-08 MED ORDER — VITAMIN D3 25 MCG (1000 UNIT) PO TABS
1000.0000 [IU] | ORAL_TABLET | Freq: Every day | ORAL | Status: DC
  Administered 2021-05-09 – 2021-05-10 (×2): 1000 [IU] via ORAL
  Filled 2021-05-08 (×4): qty 1

## 2021-05-08 MED ORDER — DOCUSATE SODIUM 50 MG/5ML PO LIQD
100.0000 mg | Freq: Two times a day (BID) | ORAL | Status: DC | PRN
  Filled 2021-05-08: qty 10

## 2021-05-08 MED ORDER — FENTANYL CITRATE (PF) 100 MCG/2ML IJ SOLN
50.0000 ug | Freq: Once | INTRAMUSCULAR | Status: DC
Start: 2021-05-08 — End: 2021-05-14

## 2021-05-08 MED ORDER — CALCIUM GLUCONATE 10 % IV SOLN
1.0000 g | Freq: Once | INTRAVENOUS | Status: AC
  Administered 2021-05-08: 1 g via INTRAVENOUS
  Filled 2021-05-08: qty 10

## 2021-05-08 MED ORDER — FAMOTIDINE IN NACL 20-0.9 MG/50ML-% IV SOLN
20.0000 mg | INTRAVENOUS | Status: DC
  Administered 2021-05-08 – 2021-05-12 (×3): 20 mg via INTRAVENOUS
  Filled 2021-05-08 (×3): qty 50

## 2021-05-08 MED ORDER — ISOSORBIDE MONONITRATE ER 30 MG PO TB24: 60.0000 mg | ORAL_TABLET | Freq: Every day | ORAL | Status: DC

## 2021-05-08 MED ORDER — ACETAMINOPHEN 325 MG PO TABS: 650.0000 mg | ORAL_TABLET | ORAL | Status: DC | PRN

## 2021-05-08 MED ORDER — DOCUSATE SODIUM 50 MG/5ML PO LIQD
100.0000 mg | Freq: Two times a day (BID) | ORAL | Status: DC
  Administered 2021-05-08 – 2021-05-14 (×12): 100 mg
  Filled 2021-05-08 (×11): qty 10

## 2021-05-08 MED ORDER — ADULT MULTIVITAMIN W/MINERALS CH
1.0000 | ORAL_TABLET | Freq: Every morning | ORAL | Status: DC
  Administered 2021-05-09: 1 via ORAL
  Filled 2021-05-08: qty 1

## 2021-05-08 MED ORDER — HYDROXYZINE HCL 50 MG/ML IM SOLN
25.0000 mg | Freq: Four times a day (QID) | INTRAMUSCULAR | Status: DC | PRN
  Filled 2021-05-08: qty 0.5

## 2021-05-08 MED ORDER — FENTANYL CITRATE (PF) 100 MCG/2ML IJ SOLN: 50.0000 ug | Freq: Once | INTRAMUSCULAR | Status: DC

## 2021-05-08 MED ORDER — DEXTROSE 50 % IV SOLN
2.0000 | Freq: Once | INTRAVENOUS | Status: AC
  Administered 2021-05-08: 100 mL via INTRAVENOUS
  Filled 2021-05-08: qty 100

## 2021-05-08 MED ORDER — DM-GUAIFENESIN ER 30-600 MG PO TB12: 1.0000 | ORAL_TABLET | Freq: Two times a day (BID) | ORAL | Status: DC | PRN

## 2021-05-08 MED ORDER — POLYETHYLENE GLYCOL 3350 17 G PO PACK
17.0000 g | PACK | Freq: Every day | ORAL | Status: DC
  Administered 2021-05-09 – 2021-05-14 (×6): 17 g
  Filled 2021-05-08 (×6): qty 1

## 2021-05-08 MED ORDER — FENTANYL 2500MCG IN NS 250ML (10MCG/ML) PREMIX INFUSION
25.0000 ug/h | INTRAVENOUS | Status: DC
  Filled 2021-05-08: qty 250

## 2021-05-08 MED ORDER — UMECLIDINIUM-VILANTEROL 62.5-25 MCG/INH IN AEPB: 1.0000 | INHALATION_SPRAY | Freq: Every day | RESPIRATORY_TRACT | Status: DC

## 2021-05-08 MED ORDER — FENTANYL BOLUS VIA INFUSION
50.0000 ug | INTRAVENOUS | Status: DC | PRN
  Administered 2021-05-11 – 2021-05-13 (×7): 100 ug via INTRAVENOUS
  Filled 2021-05-08: qty 100

## 2021-05-08 MED ORDER — ACETAMINOPHEN 325 MG PO TABS: 650.0000 mg | ORAL_TABLET | Freq: Four times a day (QID) | ORAL | Status: DC | PRN

## 2021-05-08 MED ORDER — ALBUTEROL SULFATE (2.5 MG/3ML) 0.083% IN NEBU
2.5000 mg | INHALATION_SOLUTION | RESPIRATORY_TRACT | Status: DC | PRN
Start: 2021-05-08 — End: 2021-05-19

## 2021-05-08 MED ORDER — POLYETHYLENE GLYCOL 3350 17 G PO PACK: 17.0000 g | PACK | Freq: Every day | ORAL | Status: DC | PRN

## 2021-05-08 MED ORDER — KETAMINE HCL 10 MG/ML IJ SOLN
1.0000 mg/kg | Freq: Once | INTRAMUSCULAR | Status: AC
  Administered 2021-05-08: 124 mg via INTRAVENOUS

## 2021-05-08 MED ORDER — FENTANYL BOLUS VIA INFUSION
50.0000 ug | INTRAVENOUS | Status: DC | PRN
  Filled 2021-05-08: qty 50

## 2021-05-08 MED ORDER — SODIUM CHLORIDE 0.9 % IV SOLN
3.0000 g | INTRAVENOUS | Status: AC
  Administered 2021-05-09 – 2021-05-12 (×5): 3 g via INTRAVENOUS
  Filled 2021-05-08: qty 8
  Filled 2021-05-08: qty 3
  Filled 2021-05-08 (×2): qty 8
  Filled 2021-05-08: qty 3

## 2021-05-08 MED ORDER — ONDANSETRON HCL 4 MG/2ML IJ SOLN
4.0000 mg | Freq: Once | INTRAMUSCULAR | Status: AC
  Administered 2021-05-08: 4 mg via INTRAVENOUS
  Filled 2021-05-08: qty 2

## 2021-05-08 MED ORDER — SODIUM CHLORIDE 0.9 % IV SOLN: 250.0000 mL | INTRAVENOUS | Status: DC | PRN

## 2021-05-08 MED ORDER — INSULIN GLARGINE 100 UNIT/ML ~~LOC~~ SOLN
10.0000 [IU] | Freq: Every day | SUBCUTANEOUS | Status: DC
  Administered 2021-05-09 – 2021-05-11 (×3): 10 [IU] via SUBCUTANEOUS
  Filled 2021-05-08 (×5): qty 0.1

## 2021-05-08 MED ORDER — HEPARIN SODIUM (PORCINE) 5000 UNIT/ML IJ SOLN: 5000.0000 [IU] | Freq: Three times a day (TID) | INTRAMUSCULAR | Status: DC

## 2021-05-08 MED ORDER — ALLOPURINOL 100 MG PO TABS
200.0000 mg | ORAL_TABLET | Freq: Every morning | ORAL | Status: DC
  Administered 2021-05-09: 200 mg via ORAL
  Filled 2021-05-08: qty 2

## 2021-05-08 MED ORDER — BUDESONIDE 0.5 MG/2ML IN SUSP
0.5000 mg | Freq: Two times a day (BID) | RESPIRATORY_TRACT | Status: DC
  Administered 2021-05-08 – 2021-05-19 (×22): 0.5 mg via RESPIRATORY_TRACT
  Filled 2021-05-08 (×22): qty 2

## 2021-05-08 MED ORDER — CALCIUM ACETATE (PHOS BINDER) 667 MG PO CAPS
667.0000 mg | ORAL_CAPSULE | Freq: Three times a day (TID) | ORAL | Status: DC
  Filled 2021-05-08 (×2): qty 1

## 2021-05-08 NOTE — ED Notes (Signed)
Dialysis nurse at bedside to complete dialysis treatment at this time

## 2021-05-08 NOTE — H&P (Signed)
NAME:  [name], MRN:  [id number], DOB:  Jul 22, 1966, LOS: 0 ADMISSION DATE:  05/08/2021 CONSULTATION DATE: 05/08/2021  CHIEF COMPLAINT:Respiratory   BRIEF SYNOPSIS acute and severe mental status changes and severe hypoxia   History of Present Illness:   55 y.o. male with a history of dCHF, ESRD on hemodialysis COPD, diabetes morbid obesity who presents to ER with severe altered mental status.  Had a fall hitting the left side of his forehead yesterday.    Went to dialysis this morning where he was noted to be somnolent, poorly interactive, not able to follow commands.  + headache.     Pt unable to complete dialysis today, per EMS pt had fall yesterday, at dialysis patient kept falling asleep, unable to determine baseline mentation due to being a new dialysis site. Pt with bruising and swelling present to left eye. Pt also complains of back pain and a headache.  Patient subsequently developed acute SOB and hypoxia failed biPAP and emergently intubated   Micro Data:  COVID NEG INF A/B NEG   Antimicrobials:   Antibiotics Given (last 72 hours)    None        Significant Hospital Events: Including procedures, antibiotic start and stop dates in addition to other pertinent events   . 5/23 admitted to ICU for severe hypoxia and SOB, k=7.2, 5/23 intubated severe encephalopathy      Objective   Blood pressure (!) 139/102, pulse 77, temperature 98 F (36.7 C), resp. rate 20, height 5\' 3"  (1.6 m), SpO2 93 %.    Vent Mode: PRVC FiO2 (%):  [40 %-50 %] 40 % Set Rate:  [20 bmp] 20 bmp Vt Set:  [450 mL] 450 mL PEEP:  [5 cmH20] 5 cmH20   Intake/Output Summary (Last 24 hours) at 05/08/2021 1834 Last data filed at 05/08/2021 1713 Gross per 24 hour  Intake --  Output 2260 ml  Net -2260 ml   There were no vitals filed for this visit.    REVIEW OF SYSTEMS  PATIENT IS UNABLE TO PROVIDE COMPLETE REVIEW OF SYSTEMS DUE TO SEVERE CRITICAL ILLNESS AND TOXIC METABOLIC  ENCEPHALOPATHY   PHYSICAL EXAMINATION:  GENERAL:critically ill appearing, +resp distress HEAD:+ bruising and swelling around the left orbit EYES: Pupils equal, round, reactive to light.  No scleral icterus.  MOUTH: Moist mucosal membrane. NECK: Supple.  PULMONARY: +rhonchi, +wheezing CARDIOVASCULAR: S1 and S2. Regular rate and rhythm. No murmurs, rubs, or gallops.  GASTROINTESTINAL: Soft, nontender, -distended. Positive bowel sounds.  MUSCULOSKELETAL: + edema.  NEUROLOGIC: obtunded SKIN:intact,warm,dry   Labs/imaging that I havepersonally reviewed  (right click and "Reselect all SmartList Selections" daily)      ASSESSMENT AND PLAN SYNOPSIS  55 yo morbidly obese AAM with severe acute hypoxic respiratory failure with severe toxic metabolic encephalopathy with acute diastolic heart failure  progressive cardio-renal syndrome  Severe ACUTE Hypoxic and Hypercapnic Respiratory Failure -continue Mechanical Ventilator support -continue Bronchodilator Therapy -Wean Fio2 and PEEP as tolerated -VAP/VENT bundle implementation  CARDIAC FAILURE-acute diastolic dysfunction -oxygen as needed Vent support    CARDIAC ICU monitoring   CHRONIC KIDNEY INJURY/Renal Failure -continue Foley Catheter-assess need -Avoid nephrotoxic agents -Follow urine output, BMP -Ensure adequate renal perfusion, optimize oxygenation -Renal dose medications HD as needed   NEUROLOGY Acute toxic metabolic encephalopathy, need for sedation Goal RASS -2 to -3 Will to consider MRI Assess for PRESS  ENDO - ICU hypoglycemic\Hyperglycemia protocol -check FSBS per protocol   GI GI PROPHYLAXIS as indicated  NUTRITIONAL STATUS DIET-->TF's as tolerated  Constipation protocol as indicated   ELECTROLYTES -follow labs as needed -replace as needed -pharmacy consultation and following     Best practice (right click and "Reselect all SmartList Selections" daily)  Diet:   NPO Pain/Anxiety/Delirium protocol (if indicated): Yes (RASS goal -2) VAP protocol (if indicated): Yes DVT prophylaxis: Subcutaneous Heparin GI prophylaxis: H2B Glucose control:  SSI Yes Central venous access:  N/A Arterial line:  N/A Foley:  N/A Mobility:  bed rest  PT consulted: N/A Code Status:  full code Disposition: ICU  Labs   CBC: Recent Labs  Lab 05/08/21 0859  WBC 13.8*  NEUTROABS 11.3*  HGB 11.6*  HCT 36.9*  MCV 91.6  PLT 614    Basic Metabolic Panel: Recent Labs  Lab 05/08/21 0859  NA 135  K 7.2*  CL 99  CO2 20*  GLUCOSE 135*  BUN 94*  CREATININE 13.79*  CALCIUM 8.7*   GFR: Estimated Creatinine Clearance: 7.4 mL/min (A) (by C-G formula based on SCr of 13.79 mg/dL (H)). Recent Labs  Lab 05/08/21 0859  WBC 13.8*    Liver Function Tests: Recent Labs  Lab 05/08/21 0859  AST 13*  ALT 16  ALKPHOS 145*  BILITOT 0.9  PROT 8.6*  ALBUMIN 4.3   No results for input(s): LIPASE, AMYLASE in the last 168 hours. Recent Labs  Lab 05/08/21 0859  AMMONIA 25    ABG    Component Value Date/Time   PHART 7.34 (L) 01/28/2021 1404   PCO2ART 53 (H) 01/28/2021 1404   PO2ART 82 (L) 01/28/2021 1404   HCO3 20.6 05/08/2021 0859   TCO2 30 02/24/2020 0635   ACIDBASEDEF 9.1 (H) 05/08/2021 0859   O2SAT 74.2 05/08/2021 0859     Coagulation Profile: No results for input(s): INR, PROTIME in the last 168 hours.  Cardiac Enzymes: No results for input(s): CKTOTAL, CKMB, CKMBINDEX, TROPONINI in the last 168 hours.  HbA1C: Hgb A1c MFr Bld  Date/Time Value Ref Range Status  01/28/2021 01:24 AM 5.6 4.8 - 5.6 % Final    Comment:    (NOTE) Pre diabetes:          5.7%-6.4%  Diabetes:              >6.4%  Glycemic control for   <7.0% adults with diabetes   06/18/2019 10:34 AM 14.7 (H) 4.8 - 5.6 % Final    Comment:    (NOTE) Pre diabetes:          5.7%-6.4% Diabetes:              >6.4% Glycemic control for   <7.0% adults with diabetes      CBG: Recent Labs  Lab 05/02/21 1355 05/02/21 1357 05/02/21 1522 05/08/21 1532 05/08/21 1819  GLUCAP 64* 51* 84 111* 96     Past Medical History:  He,  has a past medical history of Bell's palsy, Bell's palsy, Cataract, CHF (congestive heart failure) (HCC), Chronic kidney disease, COPD (chronic obstructive pulmonary disease) ([place]), Coronary artery disease, Diabetes mellitus without complication ([place]), Dyspnea, GERD (gastroesophageal reflux disease), Gout, HOH (hard of hearing), Hypercholesterolemia, Hypertension, NSVT (nonsustained ventricular tachycardia) ([place]), Obstructive sleep apnea, Orthopnea, Oxygen dependent, Pneumonia, Psoriasis, Psoriasis, Pulmonary HTN ([place]), and Renal failure.   Surgical History:   Past Surgical History:  Procedure Laterality Date  . A/V FISTULAGRAM Right 05/02/2021   Procedure: A/V FISTULAGRAM;  Surgeon: Katha Cabal, MD;  Location: [facility];  Service: Cardiovascular;  Laterality: Right;  . AV FISTULA PLACEMENT Right 11/20/2019  Procedure: ARTERIOVENOUS (AV) FISTULA CREATION ( BRACHIAL CEPHALIC );  Surgeon: Katha Cabal, MD;  Location: [facility];  Service: Vascular;  Laterality: Right;  . CATARACT EXTRACTION W/PHACO Left 12/17/2019   Procedure: CATARACT EXTRACTION PHACO AND INTRAOCULAR LENS PLACEMENT ([place]) LEFT ISTENT INJ DIABETIC;  Surgeon: Eulogio Bear, MD;  Location: [facility];  Service: Ophthalmology;  Laterality: Left;  [place] 00:33.2 CDE 2.96 Fluid Pack Lot # [id number]  . CATARACT EXTRACTION W/PHACO Right 02/24/2020   Procedure: CATARACT EXTRACTION PHACO AND INTRAOCULAR LENS PLACEMENT ([facility]) RIGHT DIABETIC ISTENT INJ;  Surgeon: Eulogio Bear, MD;  Location: [facility];  Service: Ophthalmology;  Laterality: Right;  [place] 00:37.3 CDE 2.88 Fluid Pack Lot # [id number]  . CORONARY ANGIOPLASTY WITH STENT PLACEMENT     stent placement  . DIALYSIS/PERMA CATHETER INSERTION N/A 06/22/2019   Procedure: DIALYSIS/PERMA CATHETER INSERTION;   Surgeon: Algernon Huxley, MD;  Location: [facility];  Service: Cardiovascular;  Laterality: N/A;  . DIALYSIS/PERMA CATHETER REMOVAL N/A 05/31/2020   Procedure: DIALYSIS/PERMA CATHETER REMOVAL;  Surgeon: Katha Cabal, MD;  Location: [facility];  Service: Cardiovascular;  Laterality: N/A;  . INSERTION OF AHMED VALVE Left 12/17/2019   Procedure: INSERTION OF iSTENT;  Surgeon: Eulogio Bear, MD;  Location: [facility];  Service: Ophthalmology;  Laterality: Left;     Social History:   reports that he has never smoked. He has never used smokeless tobacco. He reports previous alcohol use of about 1.0 standard drink of alcohol per week. He reports previous drug use. Drug: Marijuana.   Family History:  His family history includes Heart failure in his mother; Kidney failure in his brother.   Allergies Allergies  Allergen Reactions  . Shellfish Allergy Anaphylaxis    Face and throat swelling, difficulty breathing Allergy can be triggered by touching (contact)  . Other   . Betadine [Povidone Iodine] Rash  . Povidone-Iodine Rash     Home Medications  Prior to Admission medications   Medication Sig Start Date End Date Taking? Authorizing Provider  albuterol (PROVENTIL HFA;VENTOLIN HFA) 108 (90 Base) MCG/ACT inhaler Inhale 2 puffs into the lungs every 4 (four) hours as needed for wheezing or shortness of breath. 01/20/19   Doles-Johnson, Teah, NP  allopurinol (ZYLOPRIM) 100 MG tablet Take 200 mg by mouth in the morning. 09/23/20 09/23/21  [provider]  ANORO ELLIPTA 62.5-25 MCG/INH AEPB INHALE 1 PUFF INTO THE LUNGS DAILY. 11/30/19   Kendell Bane, NP  aspirin EC 81 MG tablet Take 81 mg by mouth in the morning. Swallow whole.    [provider]  atorvastatin (LIPITOR) 40 MG tablet Take 1 tablet (40 mg total) by mouth at bedtime. Patient taking differently: Take 40 mg by mouth in the morning. 03/21/20   Kendell Bane, NP  betamethasone dipropionate 0.05 %  cream Apply topically as directed. Apply every AM for 2 weeks then decrease to every other day Patient not taking: No sig reported 10/04/20   Ralene Bathe, MD  calcipotriene (DOVONOX) 0.005 % cream Apply topically as directed. Apply every PM for two weeks then every other day alternating with the betamethasone Patient not taking: No sig reported 10/04/20   Ralene Bathe, MD  Calcipotriene-Betameth Diprop (ENSTILAR) 0.005-0.064 % FOAM Apply to skin qd-bid Patient not taking: No sig reported 09/22/20   Ralene Bathe, MD  calcium acetate (PHOSLO) 667 MG capsule Take 667 mg by mouth 3 (three) times daily with meals. 01/17/21   [provider]  carvedilol (COREG) 25 MG tablet Take 25 mg by mouth 2 (two) times daily. 04/11/21   [provider]  Cholecalciferol (D3-1000) 25 MCG (1000 UT) tablet Take 2 tablets (2,000 Units total) by mouth daily. Patient taking differently: Take 1,000 Units by mouth in the morning and at bedtime. 01/20/19   Doles-Johnson, Teah, NP  cyclobenzaprine (FLEXERIL) 5 MG tablet Take 1 tablet (5 mg total) by mouth 2 (two) times daily as needed for muscle spasms. 01/31/21   Nicole Kindred A, DO  diclofenac Sodium (VOLTAREN) 1 % GEL Apply 4 g topically 4 (four) times daily as needed. For Knee Pain Patient not taking: No sig reported 01/31/21   Nicole Kindred A, DO  Dulaglutide (TRULICITY) 1.5 IR/4.8NI SOPN Inject 1.5 sq every week Patient taking differently: Inject 1.5 mg into the skin every Monday. 08/30/20   Lavera Guise, MD  furosemide (LASIX) 80 MG tablet Take 80 mg by mouth daily. 04/11/21   [provider]  gabapentin (NEURONTIN) 300 MG capsule Take 300 mg by mouth in the morning. 01/17/21   [provider]  glimepiride (AMARYL) 4 MG tablet Take 4 mg by mouth in the morning. 11/27/19   [provider]  hydrALAZINE (APRESOLINE) 100 MG tablet Take 100 mg by mouth daily. Patient not taking: No sig reported 04/11/21   [provider]  insulin degludec (TRESIBA FLEXTOUCH) 100 UNIT/ML FlexTouch Pen Inject 20 Units into the skin at bedtime. 08/30/20   Lavera Guise, MD  isosorbide mononitrate (IMDUR) 60 MG 24 hr tablet Take 60 mg by mouth daily. 04/11/21   [provider]  magnesium oxide (MAG-OX) 400 MG tablet Take 400 mg by mouth 2 (two) times daily.    [provider]  Multiple Vitamins-Minerals (TAB-A-VITE) TABS TAKE 1 TABLET BY MOUTH ONCE DAILY. Patient taking differently: Take 1 tablet by mouth in the morning. 02/19/20   Kendell Bane, NP  omeprazole (PRILOSEC) 20 MG capsule Take 1 capsule (20 mg total) by mouth 2 (two) times daily before a meal. Patient taking differently: Take 20 mg by mouth in the morning. 02/19/20   Kendell Bane, NP  Risankizumab-rzaa (SKYRIZI) 150 MG/ML SOSY Inject 150 mg into the skin as directed. Every 12 weeks for maintenance. Patient taking differently: Inject 150 mg into the skin every 4 (four) months. 11/09/20   Ralene Bathe, MD  triamcinolone cream (KENALOG) 0.1 % Apply 1 application topically 2 (two) times daily. Patient not taking: No sig reported    [provider]  TRULICITY 3 OE/7.0JJ SOPN Inject 1.5 mg into the skin once a week. 04/11/21   [provider]       DVT/GI PRX  assessed I Assessed the need for Labs I Assessed the need for Foley I Assessed the need for Central Venous Line Family Discussion when available I Assessed the need for Mobilization I made an Assessment of medications to be adjusted accordingly Safety Risk assessment completed  CASE DISCUSSED IN MULTIDISCIPLINARY ROUNDS WITH ICU TEAM     Critical Care Time devoted to patient care services described in this note is 65  minutes.  Critical care was necessary to treat /prevent imminent and life-threatening deterioration. Overall, patient is critically ill, prognosis is guarded.  Patient with Multiorgan failure and at high risk for cardiac arrest and death.     Corrin Parker, M.D.  [facility]  Medical Director [place] Director [facility] Cardio-Pulmonary Department

## 2021-05-08 NOTE — H&P (Signed)
History and Physical    [name]:[id number] DOB: Aug 12, 1966 DOA: 05/08/2021  Referring MD/NP/PA:   PCP: [facility]   Patient coming from:  The patient is coming from home.  At baseline, pt is independent for most of ADL.        Chief Complaint: AMS, fall, SOB, chest pain   HPI: [name] is a 55 y.o. male with medical history significant of ESRD-HD (MWF), HTN, HLD, DM, COPD on 3-4L O2, dCHF, CAD, CAD, gout, presents with AMS, fall, CP, SOB.   Pt has confusion, but initially still was arousable. When I saw patient in the ED, he was lethargic, but arousable, oriented x3 when he wakes up.  He moves all extremities.  No facial droop or slurred speech.  He states that he fell yesterday accidentally, obviously injured his head, developed bruise around left eye.  He complains of headache.  He states that he has central chest pain and shortness of breath, but no cough, fever or chills.  No active nausea, vomiting, diarrhea or abdominal pain.  Denies symptoms of UTI.   Patient was found to have potassium 7.2 with bradycardia, heart rate is down to 25, which improved to 90s after treated hyperkalemia with insulin, bicarbonate and calcium gluconate. Renal was consulted, urgent dialysis was done in ED.  Patient's mental status has been progressively worsening in afternoon.  Initially patient had oxygen saturation 92 to 97% on home level of 3 L oxygen. After dialysis, he became barely arousable.  He has oxygen desaturated to 86%.  Since patient could not protect his airway, he was intubated by EDP.  ED Course: pt was found to have WBC 13.8, troponin level 129, 143, negative COVID PCR, ammonia level 25, potassium 7.2, bicarbonate 20, creatinine 13.79, BUN 94, temperature normal, blood pressure 134/106, RR 24, VBG with pH 7.13, CO2 62, O2 57.  Chest x-ray showed mild pulmonary edema.  CT of the head is negative.  CT of the C-spine is negative for acute bony fracture, but showed  degenerative disc disease.  Right shoulder x-ray is negative for bony fracture.  Patient initially was admitted to progressive bed as inpatient, but changed to ICU.  Dr. Mortimer Fries of ICU was consulted.  Dr. Holley Raring of renal is consulted.  Review of Systems:   General: no fevers, chills, no body weight gain, has fatigue HEENT: no blurry vision, hearing changes or sore throat Respiratory: has dyspnea, coughing, no wheezing CV: has chest pain, no palpitations GI: no nausea, vomiting, abdominal pain, diarrhea, constipation GU: no dysuria, burning on urination, increased urinary frequency, hematuria  Ext: has mild leg edema Neuro: no unilateral weakness, numbness, or tingling, no vision change or hearing loss. Has AMS Skin: no rash, no skin tear. Has bruise around left eye. MSK: No muscle spasm, no deformity, no limitation of range of movement in spin Heme: No easy bruising.  Travel history: No recent long distant travel.  Allergy:  Allergies  Allergen Reactions  . Shellfish Allergy Anaphylaxis    Face and throat swelling, difficulty breathing Allergy can be triggered by touching (contact)  . Other   . Betadine [Povidone Iodine] Rash  . Povidone-Iodine Rash    Past Medical History:  Diagnosis Date  . Bell's palsy   . Bell's palsy   . Cataract   . CHF (congestive heart failure) ([place])   . Chronic kidney disease    dialysis  . COPD (chronic obstructive pulmonary disease) ([place])   . Coronary artery  disease   . Diabetes mellitus without complication ([place])   . Dyspnea    Wheezing  . GERD (gastroesophageal reflux disease)   . Gout   . HOH (hard of hearing)    mild  . Hypercholesterolemia   . Hypertension   . NSVT (nonsustained ventricular tachycardia) ([place])   . Obstructive sleep apnea    CPAP, O2 use continuosly at 3LPM  . Orthopnea   . Oxygen dependent    3 lpm continuous  . Pneumonia    In Past X2 most recent 1 yr ago  . Psoriasis   . Psoriasis   . Pulmonary HTN ([place])   .  Renal failure     Past Surgical History:  Procedure Laterality Date  . A/V FISTULAGRAM Right 05/02/2021   Procedure: A/V FISTULAGRAM;  Surgeon: Katha Cabal, MD;  Location: [place] [facility];  Service: Cardiovascular;  Laterality: Right;  . AV FISTULA PLACEMENT Right 11/20/2019   Procedure: ARTERIOVENOUS (AV) FISTULA CREATION ( BRACHIAL CEPHALIC );  Surgeon: Katha Cabal, MD;  Location: [facility];  Service: Vascular;  Laterality: Right;  . CATARACT EXTRACTION W/PHACO Left 12/17/2019   Procedure: CATARACT EXTRACTION PHACO AND INTRAOCULAR LENS PLACEMENT ([place]) LEFT ISTENT INJ DIABETIC;  Surgeon: Eulogio Bear, MD;  Location: [facility];  Service: Ophthalmology;  Laterality: Left;  [place] 00:33.2 CDE 2.96 Fluid Pack Lot # [id number]  . CATARACT EXTRACTION W/PHACO Right 02/24/2020   Procedure: CATARACT EXTRACTION PHACO AND INTRAOCULAR LENS PLACEMENT ([facility]) RIGHT DIABETIC ISTENT INJ;  Surgeon: Eulogio Bear, MD;  Location: [facility];  Service: Ophthalmology;  Laterality: Right;  [place] 00:37.3 CDE 2.88 Fluid Pack Lot # [id number]  . CORONARY ANGIOPLASTY WITH STENT PLACEMENT     stent placement  . DIALYSIS/PERMA CATHETER INSERTION N/A 06/22/2019   Procedure: DIALYSIS/PERMA CATHETER INSERTION;  Surgeon: Algernon Huxley, MD;  Location: [place] [facility];  Service: Cardiovascular;  Laterality: N/A;  . DIALYSIS/PERMA CATHETER REMOVAL N/A 05/31/2020   Procedure: DIALYSIS/PERMA CATHETER REMOVAL;  Surgeon: Katha Cabal, MD;  Location: [place] [facility];  Service: Cardiovascular;  Laterality: N/A;  . INSERTION OF AHMED VALVE Left 12/17/2019   Procedure: INSERTION OF iSTENT;  Surgeon: Eulogio Bear, MD;  Location: [facility];  Service: Ophthalmology;  Laterality: Left;    Social History:  reports that he has never smoked. He has never used smokeless tobacco. He reports previous alcohol use of about 1.0 standard drink of alcohol per week. He reports previous drug use. Drug:  Marijuana.  Family History:  Family History  Problem Relation Age of Onset  . Heart failure Mother   . Kidney failure Brother      Prior to Admission medications   Medication Sig Start Date End Date Taking? Authorizing Provider  albuterol (PROVENTIL HFA;VENTOLIN HFA) 108 (90 Base) MCG/ACT inhaler Inhale 2 puffs into the lungs every 4 (four) hours as needed for wheezing or shortness of breath. 01/20/19   Doles-Johnson, Teah, NP  allopurinol (ZYLOPRIM) 100 MG tablet Take 200 mg by mouth in the morning. 09/23/20 09/23/21  [provider]  ANORO ELLIPTA 62.5-25 MCG/INH AEPB INHALE 1 PUFF INTO THE LUNGS DAILY. Patient taking differently: Inhale 1 puff into the lungs daily. 11/30/19   Kendell Bane, NP  aspirin EC 81 MG tablet Take 81 mg by mouth in the morning. Swallow whole.    [provider]  atorvastatin (LIPITOR) 40 MG tablet Take 1 tablet (40 mg total) by mouth at bedtime. Patient taking differently: Take 40  mg by mouth in the morning. 03/21/20   Kendell Bane, NP  betamethasone dipropionate 0.05 % cream Apply topically as directed. Apply every AM for 2 weeks then decrease to every other day Patient not taking: No sig reported 10/04/20   Ralene Bathe, MD  calcipotriene (DOVONOX) 0.005 % cream Apply topically as directed. Apply every PM for two weeks then every other day alternating with the betamethasone Patient not taking: No sig reported 10/04/20   Ralene Bathe, MD  Calcipotriene-Betameth Diprop (ENSTILAR) 0.005-0.064 % FOAM Apply to skin qd-bid Patient not taking: No sig reported 09/22/20   Ralene Bathe, MD  calcium acetate (PHOSLO) 667 MG capsule Take 667 mg by mouth 3 (three) times daily with meals. 01/17/21   [provider]  carvedilol (COREG) 25 MG tablet Take 25 mg by mouth 2 (two) times daily. 04/11/21   [provider]  Cholecalciferol (D3-1000) 25 MCG (1000 UT) tablet Take 2 tablets (2,000 Units total) by mouth daily. Patient  taking differently: Take 1,000 Units by mouth in the morning and at bedtime. 01/20/19   Doles-Johnson, Teah, NP  cyclobenzaprine (FLEXERIL) 5 MG tablet Take 1 tablet (5 mg total) by mouth 2 (two) times daily as needed for muscle spasms. 01/31/21   Nicole Kindred A, DO  diclofenac Sodium (VOLTAREN) 1 % GEL Apply 4 g topically 4 (four) times daily as needed. For Knee Pain Patient not taking: Reported on 04/27/2021 01/31/21   Nicole Kindred A, DO  Dulaglutide (TRULICITY) 1.5 PN/3.6RW SOPN Inject 1.5 sq every week Patient taking differently: Inject 1.5 mg into the skin every Monday. 08/30/20   Lavera Guise, MD  furosemide (LASIX) 80 MG tablet Take 80 mg by mouth daily. 04/11/21   [provider]  gabapentin (NEURONTIN) 300 MG capsule Take 300 mg by mouth in the morning. 01/17/21   [provider]  glimepiride (AMARYL) 4 MG tablet Take 4 mg by mouth in the morning. 11/27/19   [provider]  hydrALAZINE (APRESOLINE) 100 MG tablet Take 100 mg by mouth daily. Patient not taking: Reported on 05/02/2021 04/11/21   [provider]  insulin degludec (TRESIBA FLEXTOUCH) 100 UNIT/ML FlexTouch Pen Inject 20 Units into the skin at bedtime. 08/30/20   Lavera Guise, MD  isosorbide mononitrate (IMDUR) 60 MG 24 hr tablet Take 60 mg by mouth daily. 04/11/21   [provider]  magnesium oxide (MAG-OX) 400 MG tablet Take 400 mg by mouth 2 (two) times daily.    [provider]  Multiple Vitamins-Minerals (TAB-A-VITE) TABS TAKE 1 TABLET BY MOUTH ONCE DAILY. Patient taking differently: Take 1 tablet by mouth in the morning. 02/19/20   Kendell Bane, NP  omeprazole (PRILOSEC) 20 MG capsule Take 1 capsule (20 mg total) by mouth 2 (two) times daily before a meal. Patient taking differently: Take 20 mg by mouth in the morning. 02/19/20   Kendell Bane, NP  Risankizumab-rzaa (SKYRIZI) 150 MG/ML SOSY Inject 150 mg into the skin as directed. Every 12 weeks for maintenance. Patient  taking differently: Inject 150 mg into the skin every 4 (four) months. 11/09/20   Ralene Bathe, MD  triamcinolone cream (KENALOG) 0.1 % Apply 1 application topically 2 (two) times daily. Patient not taking: Reported on 05/02/2021    [provider]  TRULICITY 3 ER/1.5QM SOPN Inject 1.5 mg into the skin once a week. 04/11/21   [provider]    Physical Exam: Vitals:   05/08/21 1945 05/08/21 2000  05/08/21 2029 05/08/21 2031  BP: (!) 143/123 (!) 143/87    Pulse: 64 66    Resp: 20 20    Temp:  (!) 95 F (35 C)    TempSrc:      SpO2: 94% 94% 94% 95%  Height:       General: Not in acute distress HEENT:       Eyes: PERRL, EOMI, no scleral icterus.       ENT: No discharge from the ears and nose.       Neck: No JVD, no bruit, no mass felt. Heme: No neck lymph node enlargement. Cardiac: S1/S2, RRR, No murmurs, No gallops or rubs. Respiratory: No rales, wheezing, rhonchi or rubs. GI: Soft, nondistended, nontender, no organomegaly, BS present. GU: No hematuria Ext: has trace leg edema bilaterally. 1+DP/PT pulse bilaterally. Musculoskeletal: No joint deformities, No joint redness or warmth, no limitation of ROM in spin. Skin: No rashes.  Neuro: Patient initially was confused, very lethargic, but arousable, still orientated x3, but mental status deteriorated in the afternoon, became barely arousable.  He moves all extremities. Psych: Patient is not psychotic, no suicidal or hemocidal ideation.  Labs on Admission: I have personally reviewed following labs and imaging studies  CBC: Recent Labs  Lab 05/08/21 0859  WBC 13.8*  NEUTROABS 11.3*  HGB 11.6*  HCT 36.9*  MCV 91.6  PLT 409   Basic Metabolic Panel: Recent Labs  Lab 05/08/21 0859 05/08/21 1940  NA 135 136  K 7.2* 5.9*  CL 99 96*  CO2 20* 25  GLUCOSE 135* 95  BUN 94* 61*  CREATININE 13.79* 9.26*  CALCIUM 8.7* 8.3*   GFR: Estimated Creatinine Clearance: 11 mL/min (A) (by C-G formula based on SCr  of 9.26 mg/dL (H)). Liver Function Tests: Recent Labs  Lab 05/08/21 0859  AST 13*  ALT 16  ALKPHOS 145*  BILITOT 0.9  PROT 8.6*  ALBUMIN 4.3   No results for input(s): LIPASE, AMYLASE in the last 168 hours. Recent Labs  Lab 05/08/21 0859  AMMONIA 25   Coagulation Profile: No results for input(s): INR, PROTIME in the last 168 hours. Cardiac Enzymes: No results for input(s): CKTOTAL, CKMB, CKMBINDEX, TROPONINI in the last 168 hours. BNP (last 3 results) No results for input(s): PROBNP in the last 8760 hours. HbA1C: No results for input(s): HGBA1C in the last 72 hours. CBG: Recent Labs  Lab 05/02/21 1355 05/02/21 1357 05/02/21 1522 05/08/21 1532 05/08/21 1819  GLUCAP 64* 51* 84 111* 96   Lipid Profile: No results for input(s): CHOL, HDL, LDLCALC, TRIG, CHOLHDL, LDLDIRECT in the last 72 hours. Thyroid Function Tests: No results for input(s): TSH, T4TOTAL, FREET4, T3FREE, THYROIDAB in the last 72 hours. Anemia Panel: No results for input(s): VITAMINB12, FOLATE, FERRITIN, TIBC, IRON, RETICCTPCT in the last 72 hours. Urine analysis:    Component Value Date/Time   COLORURINE YELLOW 02/18/2019 1650   APPEARANCEUR Clear 08/11/2019 0958   LABSPEC 1.016 02/18/2019 1650   PHURINE 7.0 02/18/2019 1650   GLUCOSEU 2+ (A) 08/11/2019 0958   HGBUR NEGATIVE 02/18/2019 1650   BILIRUBINUR Negative 08/11/2019 0958   KETONESUR NEGATIVE 02/18/2019 1650   PROTEINUR 3+ (A) 08/11/2019 0958   PROTEINUR NEGATIVE 02/18/2019 1650   NITRITE Negative 08/11/2019 0958   NITRITE NEGATIVE 02/18/2019 1650   LEUKOCYTESUR Negative 08/11/2019 0958   LEUKOCYTESUR NEGATIVE 02/18/2019 1650   Sepsis Labs: @LABRCNTIP (procalcitonin:4,lacticidven:4) ) Recent Results (from the past 240 hour(s))  Resp Panel by RT-PCR (Flu A&B, Covid) Nasopharyngeal Swab  Status: None   Collection Time: 05/08/21  8:04 AM   Specimen: Nasopharyngeal Swab; Nasopharyngeal(NP) swabs in vial transport medium  Result Value  Ref Range Status   SARS Coronavirus 2 by RT PCR NEGATIVE NEGATIVE Final    Comment: (NOTE) SARS-CoV-2 target nucleic acids are NOT DETECTED.  The SARS-CoV-2 RNA is generally detectable in upper respiratory specimens during the acute phase of infection. The lowest concentration of SARS-CoV-2 viral copies this assay can detect is 138 copies/mL. A negative result does not preclude SARS-Cov-2 infection and should not be used as the sole basis for treatment or other patient management decisions. A negative result may occur with  improper specimen collection/handling, submission of specimen other than nasopharyngeal swab, presence of viral mutation(s) within the areas targeted by this assay, and inadequate number of viral copies(<138 copies/mL). A negative result must be combined with clinical observations, patient history, and epidemiological information. The expected result is Negative.  Fact Sheet for Patients:  EntrepreneurPulse.com.au  Fact Sheet for Healthcare Providers:  IncredibleEmployment.be  This test is no t yet approved or cleared by the [place] FDA and  has been authorized for detection and/or diagnosis of SARS-CoV-2 by FDA under an Emergency Use Authorization (EUA). This EUA will remain  in effect (meaning this test can be used) for the duration of the COVID-19 declaration under Section 564(b)(1) of the Act, 21 U.S.C.section 360bbb-3(b)(1), unless the authorization is terminated  or revoked sooner.       Influenza A by PCR NEGATIVE NEGATIVE Final   Influenza B by PCR NEGATIVE NEGATIVE Final    Comment: (NOTE) The Xpert Xpress SARS-CoV-2/FLU/RSV plus assay is intended as an aid in the diagnosis of influenza from Nasopharyngeal swab specimens and should not be used as a sole basis for treatment. Nasal washings and aspirates are unacceptable for Xpert Xpress SARS-CoV-2/FLU/RSV testing.  Fact Sheet for  Patients: EntrepreneurPulse.com.au  Fact Sheet for Healthcare Providers: IncredibleEmployment.be  This test is not yet approved or cleared by the [place] FDA and has been authorized for detection and/or diagnosis of SARS-CoV-2 by FDA under an Emergency Use Authorization (EUA). This EUA will remain in effect (meaning this test can be used) for the duration of the COVID-19 declaration under Section 564(b)(1) of the Act, 21 U.S.C. section 360bbb-3(b)(1), unless the authorization is terminated or revoked.  Performed at [facility], [address] [place], [place] [address]   Blood culture (single)     Status: None (Preliminary result)   Collection Time: 05/08/21  8:59 AM   Specimen: BLOOD  Result Value Ref Range Status   Specimen Description BLOOD RIGHT [facility]  Final   Special Requests   Final    BOTTLES DRAWN AEROBIC AND ANAEROBIC Blood Culture results may not be optimal due to an excessive volume of blood received in culture bottles   Culture   Final    NO GROWTH < 12 HOURS Performed at [facility], [address], [place], [place] [address]    Report Status PENDING  Incomplete  MRSA PCR Screening     Status: None   Collection Time: 05/08/21  6:50 PM   Specimen: Nasopharyngeal  Result Value Ref Range Status   MRSA by PCR NEGATIVE NEGATIVE Final    Comment:        The GeneXpert MRSA Assay (FDA approved for NASAL specimens only), is one component of a comprehensive MRSA colonization surveillance program. It is not intended to diagnose MRSA infection nor to guide or monitor treatment for  MRSA infections. Performed at [facility], [address] [place], [place] [address]      Radiological Exams on Admission: CT Head Wo Contrast  Result Date: 05/08/2021 CLINICAL DATA:  Altered mental status, history of fall yesterday, initial encounter EXAM: CT HEAD WITHOUT CONTRAST CT CERVICAL SPINE WITHOUT CONTRAST  TECHNIQUE: Multidetector CT imaging of the head and cervical spine was performed following the standard protocol without intravenous contrast. Multiplanar CT image reconstructions of the cervical spine were also generated. COMPARISON:  01/27/2021 FINDINGS: CT HEAD FINDINGS Brain: No evidence of acute infarction, hemorrhage, hydrocephalus, extra-axial collection or mass lesion/mass effect. Chronic atrophic and ischemic changes are again identified and stable. Vascular: No hyperdense vessel or unexpected calcification. Skull: Normal. Negative for fracture or focal lesion. Sinuses/Orbits: No acute finding. Other: None. CT CERVICAL SPINE FINDINGS Alignment: Normal. Skull base and vertebrae: 7 cervical segments are well visualized. Vertebral body height is well maintained. Disc space narrowing and osteophytic changes are noted. No acute fracture or acute facet abnormality is noted. Soft tissues and spinal canal: Surrounding soft tissue structures are within normal limits. Upper chest: Visualized lung apices are within normal limits. Other: None IMPRESSION: CT of the head: Chronic atrophic and ischemic changes without acute abnormality. CT of the cervical spine: Multilevel degenerative change without acute abnormality. Electronically Signed   By: Inez Catalina M.D.   On: 05/08/2021 08:54   CT CERVICAL SPINE WO CONTRAST  Result Date: 05/08/2021 CLINICAL DATA:  Altered mental status, history of fall yesterday, initial encounter EXAM: CT HEAD WITHOUT CONTRAST CT CERVICAL SPINE WITHOUT CONTRAST TECHNIQUE: Multidetector CT imaging of the head and cervical spine was performed following the standard protocol without intravenous contrast. Multiplanar CT image reconstructions of the cervical spine were also generated. COMPARISON:  01/27/2021 FINDINGS: CT HEAD FINDINGS Brain: No evidence of acute infarction, hemorrhage, hydrocephalus, extra-axial collection or mass lesion/mass effect. Chronic atrophic and ischemic changes are  again identified and stable. Vascular: No hyperdense vessel or unexpected calcification. Skull: Normal. Negative for fracture or focal lesion. Sinuses/Orbits: No acute finding. Other: None. CT CERVICAL SPINE FINDINGS Alignment: Normal. Skull base and vertebrae: 7 cervical segments are well visualized. Vertebral body height is well maintained. Disc space narrowing and osteophytic changes are noted. No acute fracture or acute facet abnormality is noted. Soft tissues and spinal canal: Surrounding soft tissue structures are within normal limits. Upper chest: Visualized lung apices are within normal limits. Other: None IMPRESSION: CT of the head: Chronic atrophic and ischemic changes without acute abnormality. CT of the cervical spine: Multilevel degenerative change without acute abnormality. Electronically Signed   By: Inez Catalina M.D.   On: 05/08/2021 08:54   DG Chest Port 1 View  Result Date: 05/08/2021 CLINICAL DATA:  Endotracheal and orogastric tube placement. EXAM: PORTABLE CHEST 1 VIEW COMPARISON:  Radiograph earlier today FINDINGS: Endotracheal tube tip is 19 mm from the carina. Enteric tube in place with tip below the diaphragm not included in the field of view. Low lung volumes. Cardiomegaly. Increasing patchy bibasilar opacities. Possible small right pleural effusion and fluid in the fissure. Perivascular haziness. Right subclavian stent again seen. No pneumothorax. IMPRESSION: 1. Endotracheal tube tip 19 mm from the carina. Enteric tube in place with tip below the diaphragm not included in the field of view. 2. Low lung volumes with increasing patchy bibasilar opacities. Cardiomegaly with vascular congestion. Possible small right pleural effusion. Electronically Signed   By: Keith Rake M.D.   On: 05/08/2021 19:22   DG Chest Portable 1  View  Result Date: 05/08/2021 CLINICAL DATA:  Altered mental status, weakness, right shoulder pain. EXAM: PORTABLE CHEST 1 VIEW COMPARISON:  01/27/2021 and CT  chest 11/23/2016. FINDINGS: Trachea is midline. Heart is enlarged. Central interstitial prominence and indistinctness. Thickening of the minor fissure. Slight blunting of the right costophrenic angle. IMPRESSION: Mild congestive heart failure. Difficult to exclude a viral or atypical pneumonia. Electronically Signed   By: Lorin Picket M.D.   On: 05/08/2021 08:11   DG Shoulder Right Portable  Result Date: 05/08/2021 CLINICAL DATA:  Right shoulder pain Altered mental status Weakness EXAM: PORTABLE RIGHT SHOULDER COMPARISON:  06/21/2019 FINDINGS: No fracture or dislocation. Mild spurring of the acromioclavicular joint. Multiple right axillary vascular stents are noted. IMPRESSION: Mild right AC joint osteoarthrosis. Electronically Signed   By: Miachel Roux M.D.   On: 05/08/2021 08:13   DG Abd Portable 1V  Result Date: 05/08/2021 CLINICAL DATA:  Orogastric tube placement. EXAM: PORTABLE ABDOMEN - 1 VIEW COMPARISON:  None. FINDINGS: Tip and side port of the enteric tube below the diaphragm in the stomach. Nonobstructed upper abdominal bowel gas pattern. IMPRESSION: Tip and side port of the enteric tube below the diaphragm in the stomach. Electronically Signed   By: Keith Rake M.D.   On: 05/08/2021 19:23     EKG: I have personally reviewed.  Sinus rhythm, QTC 531, LAD, poor R wave progression, low voltage, no acute kidney   Assessment/Plan Principal Problem:   Hyperkalemia Active Problems:   Essential hypertension   CAD (coronary artery disease)   Chronic gout of multiple sites   ESRD (end stage renal disease) on dialysis (HCC)   Chronic diastolic heart failure (HCC)   Acute on chronic respiratory failure with hypoxia (HCC)   COPD (chronic obstructive pulmonary disease) (HCC)   HLD (hyperlipidemia)   Fall at home, initial encounter   Acute metabolic encephalopathy   Uremia   Elevated troponin   Leukocytosis   Type II diabetes mellitus with renal manifestations  (HCC)   Hyperkalemia and uremia: Potassium 7.2 with bradycardia, creatinine 13.79, BUN 94.  Patient has altered mental status. -Admitted to ICU as inpatient -Renal was consulted for urgent dialysis, Dr. Holley Raring -Patient was treated for hyperkalemia with D50, NovoLog, bicarbonate, calcium gluconate  Essential hypertension -IV hydralazine as needed -Hold Coreg due to bradycardia -Patient is not taking hydralazine, Imdur currently  CAD (coronary artery disease) and chest pain, elevated trop: trop 129 -->143.  Likely due to demand ischemia. -Trend troponin -Aspirin -Check A1c, FLP, UDS  Chronic gout of multiple sites -Allopurinol  ESRD (end stage renal disease) on dialysis [facility] -Renal was consulted for dialysis  Chronic diastolic heart failure ([place]): 2D echo on 01/29/2021 showed EF of 55 to 60% with grade 2 diastolic dysfunction.  Patient has trace leg edema, and mild pulm edema on chest x-ray -Renal was consulted for urgent dialysis  Acute on chronic respiratory failure with hypoxia [facility]): Likely due to pulmonary edema -Urgent dialysis -Bronchodilators -Patient is intubated due to altered mental status, inability to protect airway  COPD (chronic obstructive pulmonary disease) (HCC) -Bronchodilators  HLD (hyperlipidemia) -pt is not taking Lipitor -f/u FLP  Fall at home, initial encounter: CT head negative.  CT of C-spine is negative for bony fracture. -PT/OT when able to  Acute metabolic encephalopathy: Likely due to uremia.  CT head negative. -Frequent neuro check -Patient is intubated due to inability to protect airway  Leukocytosis: WBC 13.8, no fever, no source of infection identified.  Possibly reactive -Follow-up urinalysis -  Follow-up blood culture  Type II diabetes mellitus with renal manifestations ([place]): A1c 5.6, recently.  Well controlled.  Patient is taking [place], Amaryl and Trulicity -Sliding scale insulin - Decrease Tresiba dose from 20 to 10 units  daily   DVT ppx: SQ Heparin         Code Status: Full code Family Communication: I have tried to call her son and daughter without success, finally I was able to reach his friend, Mr. Tommy Mimms who has kindly agreed to inform pt's son and daughter. Disposition Plan:  Anticipate discharge back to previous environment Consults called:   Dr. Mortimer Fries of ICU was consulted.  Dr. Holley Raring of renal is consulted. Admission status and Level of care: ICU:   as inpt       Status is: Inpatient  Remains inpatient appropriate because:Inpatient level of care appropriate due to severity of illness   Dispo: The patient is from: Home              Anticipated d/c is to: Home              Patient currently is not medically stable to d/c.   Difficult to place patient No           Date of Service 05/08/2021    [facility]   If 7PM-7AM, please contact night-coverage [email or web address] 05/08/2021, 8:39 PM

## 2021-05-08 NOTE — ED Notes (Signed)
Dr. Blaine Hamper and Dr. Vallery Sa at bedside. Plan to intubate pt now. ICU RN given report and aware of plan to intubate

## 2021-05-08 NOTE — Consult Note (Signed)
Pharmacy Antibiotic Note  [name] is a 55 y.o. male with h/o dCHF, ESRD on HD COPD, diabetes morbid obesity who presents to ER with severe altered mental status. Had a fall hitting the left side of his forehead yesterday admitted on 05/08/2021 with aspiration pneumonia.  Pharmacy has been consulted for Unasyn dosing.  Plan: Initiate Unasyn 3g q24h after HD on HD days.  Height: 5\' 3"  (160 cm) IBW/kg (Calculated) : 56.9  Temp (24hrs), Avg:96.9 F (36.1 C), Min:95 F (35 C), Max:98 F (36.7 C)  Recent Labs  Lab 05/08/21 0859 05/08/21 1940  WBC 13.8*  --   CREATININE 13.79* 9.26*    Estimated Creatinine Clearance: 11 mL/min (A) (by C-G formula based on SCr of 9.26 mg/dL (H)).    Allergies  Allergen Reactions  . Shellfish Allergy Anaphylaxis    Face and throat swelling, difficulty breathing Allergy can be triggered by touching (contact)  . Other   . Betadine [Povidone Iodine] Rash  . Povidone-Iodine Rash    Antimicrobials this admission: Unasyn (5/23 >>    Dose adjustments this admission: ESRD-HD. CTM if access changes.  Microbiology results: 5/23 BCx: (single set) sent/pending 5/23 Sputum: sent/pending  5/23 MRSA PCR: Negative  Thank you for allowing pharmacy to be a part of this patient's care.  Lorna Dibble 05/08/2021 8:39 PM

## 2021-05-08 NOTE — ED Notes (Signed)
Dialysis RN hit call bell to notify primary RN that pt O2 sat sitting around 86% on 4 L. Placed patient on non re breather. O2 sat still only improved to 90%. Pt has had 2600 cc removed so far. Tolerating otherwise. ER doc to bedside, orders for ABG and bipap. Respiratory called and coming to department. Also messaged hospitalist and notified him. Will continue to monitor closely.

## 2021-05-08 NOTE — ED Triage Notes (Signed)
Pt to ER via [facility] from dialysis.   Pt here related to altered mental status. Pt unable to complete dialysis today, per EMS pt had fall yesterday, at dialysis patient kept falling asleep, unable to determine baseline mentation due to being a new dialysis site. Pt alert and oriented to self and place with EMS. Pt with bruising and swelling present to left eye. Pt also complains of back pain and a headache.  Pt on 3-4L via [place] chronically. Pt on 3L at this time with O2 sats on 97%. Pt follows commands.  Ems VS- bp 149/71, cbg 130, Hr 60's.

## 2021-05-08 NOTE — ED Provider Notes (Signed)
Emergency department procedure note  Procedure Name: Intubation Date/Time: 05/08/2021 9:01 PM Performed by: Naaman Plummer, MD Pre-anesthesia Checklist: Patient identified, Patient being monitored, Emergency Drugs available, Timeout performed and Suction available Oxygen Delivery Method: Non-rebreather mask Preoxygenation: Pre-oxygenation with 100% oxygen Induction Type: Rapid sequence Ventilation: Mask ventilation without difficulty Laryngoscope Size: Glidescope Grade View: Grade I Tube size: 8.0 mm Number of attempts: 1 Airway Equipment and Method: Video-laryngoscopy Placement Confirmation: ETT inserted through vocal cords under direct vision,  CO2 detector and Breath sounds checked- equal and bilateral Secured at: 24 cm Tube secured with: ETT holder         Naaman Plummer, MD 05/08/21 2102

## 2021-05-08 NOTE — ED Notes (Signed)
Ultrasound machine placed at bedside for provider to attempt to obtain IV access at this time.

## 2021-05-08 NOTE — ED Notes (Signed)
Pt intubated with 8.0 ETT by Dr. Cheri Fowler.  24 at the lip. Positive color change.

## 2021-05-08 NOTE — ED Provider Notes (Signed)
[facility] Emergency Department Provider Note  ____________________________________________  Time seen: Approximately 8:15 AM  I have reviewed the triage vital signs and the nursing notes.   HISTORY  Chief Complaint Altered Mental Status    Level 5 Caveat: Portions of the History and Physical including HPI and review of systems are unable to be completely obtained due to patient being a poor historian   HPI [name] is a 55 y.o. male with a history of CHF ESRD on hemodialysis COPD diabetes morbid obesity who comes ED complaining of altered mental status.  Had a fall hitting the left side of his forehead yesterday.  Went to dialysis this morning where he was noted to be somnolent, poorly interactive, not able to follow commands.  Complains of headache.  Denies chest pain shortness of breath or fever.  Normal dialysis access is right upper extremity AV fistula.      Past Medical History:  Diagnosis Date  . Bell's palsy   . Bell's palsy   . Cataract   . CHF (congestive heart failure) ([place])   . Chronic kidney disease    dialysis  . COPD (chronic obstructive pulmonary disease) ([place])   . Coronary artery disease   . Diabetes mellitus without complication ([place])   . Dyspnea    Wheezing  . GERD (gastroesophageal reflux disease)   . Gout   . HOH (hard of hearing)    mild  . Hypercholesterolemia   . Hypertension   . NSVT (nonsustained ventricular tachycardia) ([place])   . Obstructive sleep apnea    CPAP, O2 use continuosly at 3LPM  . Orthopnea   . Oxygen dependent    3 lpm continuous  . Pneumonia    In Past X2 most recent 1 yr ago  . Psoriasis   . Psoriasis   . Pulmonary HTN ([place])   . Renal failure      Patient Active Problem List   Diagnosis Date Noted  . Hypotension 01/29/2021  . Volume overload 01/29/2021  . Hemodialysis patient ([place]) 06/28/2020  . Complication from renal dialysis device 03/09/2020  . 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 Hyperkalemia 06/15/2019  . Type 2 diabetes mellitus with hyperglycemia, with long-term current use of insulin ([place]) 03/05/2019  . Hypoglycemia 02/18/2019  . Altered mental status 02/18/2019  . RLQ abdominal mass 01/20/2019  . CHF (congestive heart failure) ([place]) 10/07/2018  . Muscle cramps 10/07/2018  . Shortness of breath 09/08/2018  . Morbid obesity ([place]) 06/24/2018  . Acute on chronic respiratory failure with hypoxia ([place]) 04/26/2018  . Morbid obesity with BMI of 50.0-59.9, adult ([place]) 04/23/2018  . Chronic diastolic heart failure ([place]) 02/10/2018  . Obstructive sleep apnea 02/10/2018  . Obesity hypoventilation syndrome ([place]) 09/26/2017  . Diabetes mellitus without complication ([place]) 35/36/1443  . CAD (coronary artery disease) 01/31/2017  . Hyperlipidemia, unspecified 01/31/2017  . Essential hypertension 01/17/2017  . Psoriasis 01/17/2017  . Chronic gout of multiple sites 12/21/2016  . DM type 2 with diabetic peripheral neuropathy ([place]) 06/25/2016  . Cardiomyopathy ([place]) 08/19/2012  . End stage renal disease ([place]) 08/19/2012  . Hyperlipidemia associated with type 2 diabetes mellitus ([place]) 12/17/1998     Past Surgical History:  Procedure Laterality Date  . A/V FISTULAGRAM Right 05/02/2021   Procedure: A/V FISTULAGRAM;  Surgeon: Katha Cabal, MD;  Location: [place] [facility];  Service: Cardiovascular;  Laterality: Right;  . AV FISTULA PLACEMENT Right 11/20/2019   Procedure:  ARTERIOVENOUS (AV) FISTULA CREATION ( BRACHIAL CEPHALIC );  Surgeon: Katha Cabal, MD;  Location: [facility];  Service: Vascular;  Laterality: Right;  . CATARACT EXTRACTION W/PHACO Left 12/17/2019   Procedure: CATARACT EXTRACTION PHACO AND INTRAOCULAR LENS PLACEMENT ([place]) LEFT ISTENT INJ DIABETIC;  Surgeon: Eulogio Bear, MD;  Location: [facility];  Service: Ophthalmology;  Laterality: Left;  [place] 00:33.2 CDE 2.96 Fluid Pack  Lot # [id number]  . CATARACT EXTRACTION W/PHACO Right 02/24/2020   Procedure: CATARACT EXTRACTION PHACO AND INTRAOCULAR LENS PLACEMENT (IOC) RIGHT DIABETIC ISTENT INJ;  Surgeon: Eulogio Bear, MD;  Location: [facility];  Service: Ophthalmology;  Laterality: Right;  [place] 00:37.3 CDE 2.88 Fluid Pack Lot # [id number]  . CORONARY ANGIOPLASTY WITH STENT PLACEMENT     stent placement  . DIALYSIS/PERMA CATHETER INSERTION N/A 06/22/2019   Procedure: DIALYSIS/PERMA CATHETER INSERTION;  Surgeon: Algernon Huxley, MD;  Location: [place] [facility];  Service: Cardiovascular;  Laterality: N/A;  . DIALYSIS/PERMA CATHETER REMOVAL N/A 05/31/2020   Procedure: DIALYSIS/PERMA CATHETER REMOVAL;  Surgeon: Katha Cabal, MD;  Location: [place] [facility];  Service: Cardiovascular;  Laterality: N/A;  . INSERTION OF AHMED VALVE Left 12/17/2019   Procedure: INSERTION OF iSTENT;  Surgeon: Eulogio Bear, MD;  Location: [facility];  Service: Ophthalmology;  Laterality: Left;     Prior to Admission medications   Medication Sig Start Date End Date Taking? Authorizing Provider  albuterol (PROVENTIL HFA;VENTOLIN HFA) 108 (90 Base) MCG/ACT inhaler Inhale 2 puffs into the lungs every 4 (four) hours as needed for wheezing or shortness of breath. 01/20/19   Doles-Johnson, Teah, NP  allopurinol (ZYLOPRIM) 100 MG tablet Take 200 mg by mouth in the morning. 09/23/20 09/23/21  [provider]  ANORO ELLIPTA 62.5-25 MCG/INH AEPB INHALE 1 PUFF INTO THE LUNGS DAILY. Patient taking differently: Inhale 1 puff into the lungs daily. 11/30/19   Kendell Bane, NP  aspirin EC 81 MG tablet Take 81 mg by mouth in the morning. Swallow whole.    [provider]  atorvastatin (LIPITOR) 40 MG tablet Take 1 tablet (40 mg total) by mouth at bedtime. Patient taking differently: Take 40 mg by mouth in the morning. 03/21/20   Kendell Bane, NP  betamethasone dipropionate 0.05 % cream Apply topically as directed. Apply every AM for 2  weeks then decrease to every other day Patient not taking: No sig reported 10/04/20   Ralene Bathe, MD  calcipotriene (DOVONOX) 0.005 % cream Apply topically as directed. Apply every PM for two weeks then every other day alternating with the betamethasone Patient not taking: No sig reported 10/04/20   Ralene Bathe, MD  Calcipotriene-Betameth Diprop (ENSTILAR) 0.005-0.064 % FOAM Apply to skin qd-bid Patient not taking: No sig reported 09/22/20   Ralene Bathe, MD  calcium acetate (PHOSLO) 667 MG capsule Take 667 mg by mouth 3 (three) times daily with meals. 01/17/21   [provider]  carvedilol (COREG) 25 MG tablet Take 25 mg by mouth 2 (two) times daily. 04/11/21   [provider]  Cholecalciferol (D3-1000) 25 MCG (1000 UT) tablet Take 2 tablets (2,000 Units total) by mouth daily. Patient taking differently: Take 1,000 Units by mouth in the morning and at bedtime. 01/20/19   Doles-Johnson, Teah, NP  cyclobenzaprine (FLEXERIL) 5 MG tablet Take 1 tablet (5 mg total) by mouth 2 (two) times daily as needed for muscle spasms. 01/31/21   Nicole Kindred A, DO  diclofenac Sodium (VOLTAREN) 1 %  GEL Apply 4 g topically 4 (four) times daily as needed. For Knee Pain Patient not taking: Reported on 04/27/2021 01/31/21   Nicole Kindred A, DO  Dulaglutide (TRULICITY) 1.5 MG/8.6PY SOPN Inject 1.5 sq every week Patient taking differently: Inject 1.5 mg into the skin every Monday. 08/30/20   Lavera Guise, MD  furosemide (LASIX) 80 MG tablet Take 80 mg by mouth daily. 04/11/21   [provider]  gabapentin (NEURONTIN) 300 MG capsule Take 300 mg by mouth in the morning. 01/17/21   [provider]  glimepiride (AMARYL) 4 MG tablet Take 4 mg by mouth in the morning. 11/27/19   [provider]  hydrALAZINE (APRESOLINE) 100 MG tablet Take 100 mg by mouth daily. Patient not taking: Reported on 05/02/2021 04/11/21   [provider]  insulin degludec (TRESIBA  FLEXTOUCH) 100 UNIT/ML FlexTouch Pen Inject 20 Units into the skin at bedtime. 08/30/20   Lavera Guise, MD  isosorbide mononitrate (IMDUR) 60 MG 24 hr tablet Take 60 mg by mouth daily. 04/11/21   [provider]  magnesium oxide (MAG-OX) 400 MG tablet Take 400 mg by mouth 2 (two) times daily.    [provider]  Multiple Vitamins-Minerals (TAB-A-VITE) TABS TAKE 1 TABLET BY MOUTH ONCE DAILY. Patient taking differently: Take 1 tablet by mouth in the morning. 02/19/20   Kendell Bane, NP  omeprazole (PRILOSEC) 20 MG capsule Take 1 capsule (20 mg total) by mouth 2 (two) times daily before a meal. Patient taking differently: Take 20 mg by mouth in the morning. 02/19/20   Kendell Bane, NP  Risankizumab-rzaa (SKYRIZI) 150 MG/ML SOSY Inject 150 mg into the skin as directed. Every 12 weeks for maintenance. Patient taking differently: Inject 150 mg into the skin every 4 (four) months. 11/09/20   Ralene Bathe, MD  triamcinolone cream (KENALOG) 0.1 % Apply 1 application topically 2 (two) times daily. Patient not taking: Reported on 05/02/2021    [provider]  TRULICITY 3 PP/5.0DT SOPN Inject 1.5 mg into the skin once a week. 04/11/21   [provider]     Allergies Shellfish allergy, Other, Betadine [povidone iodine], and Povidone-iodine   Family History  Problem Relation Age of Onset  . Heart failure Mother   . Kidney failure Brother     Social History Social History   Tobacco Use  . Smoking status: Never Smoker  . Smokeless tobacco: Never Used  Vaping Use  . Vaping Use: Never used  Substance Use Topics  . Alcohol use: Not Currently    Alcohol/week: 1.0 standard drink    Types: 1 Cans of beer per week    Comment: occasional  . Drug use: Not Currently    Types: Marijuana    Comment: in high school     Review of Systems Level 5 Caveat: Portions of the History and Physical including HPI and review of systems are unable to be completely  obtained due to patient being a poor historian   Constitutional:   No known fever.  ENT:   No rhinorrhea. Cardiovascular:   No chest pain or syncope. Respiratory:   No dyspnea or cough. Gastrointestinal:   Negative for abdominal pain, vomiting and diarrhea.  Musculoskeletal:   Left facial pain ____________________________________________   PHYSICAL EXAM:  VITAL SIGNS: ED Triage Vitals  Enc Vitals Group     BP 05/08/21 0725 125/60     Pulse Rate 05/08/21 0725 (!) 56     Resp 05/08/21 0725 20  Temp 05/08/21 0727 97.6 F (36.4 C)     Temp Source 05/08/21 0727 Oral     SpO2 05/08/21 0725 97 %     Weight --      Height 05/08/21 0725 5\' 3"  (1.6 m)     Head Circumference --      Peak Flow --      Pain Score 05/08/21 0725 6     Pain Loc --      Pain Edu? --      Excl. in [place]? --     Vital signs reviewed, nursing assessments reviewed.   Constitutional:   Alert and oriented. Non-toxic appearance. Eyes:   Conjunctivae are normal. EOMI. PERRL. ENT      Head:   Normocephalic with bruising and swelling around the left orbit.  Lateral orbital wall is tender to the touch without crepitus or depression.      Nose:   No congestion/rhinnorhea.       Mouth/Throat:   MMM, no pharyngeal erythema. No peritonsillar mass.       Neck:   No meningismus. Full ROM. Hematological/Lymphatic/Immunilogical:   No cervical lymphadenopathy. Cardiovascular:   RRR. Symmetric bilateral radial and DP pulses.  No murmurs.  Positive thrill in right upper extremity AV fistula.  Cap refill less than 2 seconds. Respiratory:   Normal respiratory effort without tachypnea/retractions. Breath sounds are clear and equal bilaterally. No wheezes/rales/rhonchi. Gastrointestinal:   Soft and nontender. Non distended. There is no CVA tenderness.  No rebound, rigidity, or guarding. Musculoskeletal:   Normal range of motion in all extremities. No joint effusions.  No lower extremity tenderness.  No edema. Neurologic:    Normal speech, very limited language expression.  Difficulty following commands.  Somnolent.  Motor grossly intact.  Skin:    Skin is warm, dry and intact. No rash noted.  No petechiae, purpura, or bullae.  ____________________________________________    LABS (pertinent positives/negatives) (all labs ordered are listed, but only abnormal results are displayed) Labs Reviewed  COMPREHENSIVE METABOLIC PANEL - Abnormal; Notable for the following components:      Result Value   Potassium 7.2 (*)    CO2 20 (*)    Glucose, Bld 135 (*)    BUN 94 (*)    Creatinine, Ser 13.79 (*)    Calcium 8.7 (*)    Total Protein 8.6 (*)    AST 13 (*)    Alkaline Phosphatase 145 (*)    GFR, Estimated 4 (*)    Anion gap 16 (*)    All other components within normal limits  CBC WITH DIFFERENTIAL/PLATELET - Abnormal; Notable for the following components:   WBC 13.8 (*)    RBC 4.03 (*)    Hemoglobin 11.6 (*)    HCT 36.9 (*)    Neutro Abs 11.3 (*)    All other components within normal limits  BLOOD GAS, VENOUS - Abnormal; Notable for the following components:   pH, Ven 7.13 (*)    pCO2, Ven 62 (*)    pO2, Ven 53.0 (*)    Acid-base deficit 9.1 (*)    All other components within normal limits  TROPONIN I (HIGH SENSITIVITY) - Abnormal; Notable for the following components:   Troponin I (High Sensitivity) 129 (*)    All other components within normal limits  RESP PANEL BY RT-PCR (FLU A&B, COVID) ARPGX2  CULTURE, BLOOD (SINGLE)  AMMONIA  TROPONIN I (HIGH SENSITIVITY)   ____________________________________________   EKG  Interpreted by me Sinus bradycardia rate  of 55, left axis, slightly prolonged QTC.  Poor R wave progression.  Normal ST segments and T waves.  No ischemic changes.  ____________________________________________    RADIOLOGY  CT Head Wo Contrast  Result Date: 05/08/2021 CLINICAL DATA:  Altered mental status, history of fall yesterday, initial encounter EXAM: CT HEAD WITHOUT  CONTRAST CT CERVICAL SPINE WITHOUT CONTRAST TECHNIQUE: Multidetector CT imaging of the head and cervical spine was performed following the standard protocol without intravenous contrast. Multiplanar CT image reconstructions of the cervical spine were also generated. COMPARISON:  01/27/2021 FINDINGS: CT HEAD FINDINGS Brain: No evidence of acute infarction, hemorrhage, hydrocephalus, extra-axial collection or mass lesion/mass effect. Chronic atrophic and ischemic changes are again identified and stable. Vascular: No hyperdense vessel or unexpected calcification. Skull: Normal. Negative for fracture or focal lesion. Sinuses/Orbits: No acute finding. Other: None. CT CERVICAL SPINE FINDINGS Alignment: Normal. Skull base and vertebrae: 7 cervical segments are well visualized. Vertebral body height is well maintained. Disc space narrowing and osteophytic changes are noted. No acute fracture or acute facet abnormality is noted. Soft tissues and spinal canal: Surrounding soft tissue structures are within normal limits. Upper chest: Visualized lung apices are within normal limits. Other: None IMPRESSION: CT of the head: Chronic atrophic and ischemic changes without acute abnormality. CT of the cervical spine: Multilevel degenerative change without acute abnormality. Electronically Signed   By: Inez Catalina M.D.   On: 05/08/2021 08:54   CT CERVICAL SPINE WO CONTRAST  Result Date: 05/08/2021 CLINICAL DATA:  Altered mental status, history of fall yesterday, initial encounter EXAM: CT HEAD WITHOUT CONTRAST CT CERVICAL SPINE WITHOUT CONTRAST TECHNIQUE: Multidetector CT imaging of the head and cervical spine was performed following the standard protocol without intravenous contrast. Multiplanar CT image reconstructions of the cervical spine were also generated. COMPARISON:  01/27/2021 FINDINGS: CT HEAD FINDINGS Brain: No evidence of acute infarction, hemorrhage, hydrocephalus, extra-axial collection or mass lesion/mass effect.  Chronic atrophic and ischemic changes are again identified and stable. Vascular: No hyperdense vessel or unexpected calcification. Skull: Normal. Negative for fracture or focal lesion. Sinuses/Orbits: No acute finding. Other: None. CT CERVICAL SPINE FINDINGS Alignment: Normal. Skull base and vertebrae: 7 cervical segments are well visualized. Vertebral body height is well maintained. Disc space narrowing and osteophytic changes are noted. No acute fracture or acute facet abnormality is noted. Soft tissues and spinal canal: Surrounding soft tissue structures are within normal limits. Upper chest: Visualized lung apices are within normal limits. Other: None IMPRESSION: CT of the head: Chronic atrophic and ischemic changes without acute abnormality. CT of the cervical spine: Multilevel degenerative change without acute abnormality. Electronically Signed   By: Inez Catalina M.D.   On: 05/08/2021 08:54   DG Chest Portable 1 View  Result Date: 05/08/2021 CLINICAL DATA:  Altered mental status, weakness, right shoulder pain. EXAM: PORTABLE CHEST 1 VIEW COMPARISON:  01/27/2021 and CT chest 11/23/2016. FINDINGS: Trachea is midline. Heart is enlarged. Central interstitial prominence and indistinctness. Thickening of the minor fissure. Slight blunting of the right costophrenic angle. IMPRESSION: Mild congestive heart failure. Difficult to exclude a viral or atypical pneumonia. Electronically Signed   By: Lorin Picket M.D.   On: 05/08/2021 08:11   DG Shoulder Right Portable  Result Date: 05/08/2021 CLINICAL DATA:  Right shoulder pain Altered mental status Weakness EXAM: PORTABLE RIGHT SHOULDER COMPARISON:  06/21/2019 FINDINGS: No fracture or dislocation. Mild spurring of the acromioclavicular joint. Multiple right axillary vascular stents are noted. IMPRESSION: Mild right AC joint osteoarthrosis. Electronically Signed  By: Sharen Heck  Mir M.D.   On: 05/08/2021 08:13     ____________________________________________   PROCEDURES .Critical Care Performed by: Carrie Mew, MD Authorized by: Carrie Mew, MD   Critical care provider statement:    Critical care time (minutes):  35   Critical care time was exclusive of:  Separately billable procedures and treating other patients   Critical care was necessary to treat or prevent imminent or life-threatening deterioration of the following conditions:  CNS failure or compromise, cardiac failure, circulatory failure, renal failure and metabolic crisis   Critical care was time spent personally by me on the following activities:  Development of treatment plan with patient or surrogate, discussions with consultants, evaluation of patient's response to treatment, examination of patient, obtaining history from patient or surrogate, ordering and performing treatments and interventions, ordering and review of laboratory studies, ordering and review of radiographic studies, pulse oximetry, re-evaluation of patient's condition, review of old charts and blood draw for specimens Comments:        Angiocath insertion  Date/Time: 05/08/2021 9:11 AM Performed by: Carrie Mew, MD Authorized by: Carrie Mew, MD  Consent: The procedure was performed in an emergent situation. Patient identity confirmed: arm band Local anesthesia used: no  Anesthesia: Local anesthesia used: no  Sedation: Patient sedated: no  Patient tolerance: patient tolerated the procedure well with no immediate complications Comments: [place] visualization, 20g, L AC / median cubital vein. 2 attempts. EBL 0     ____________________________________________  DIFFERENTIAL DIAGNOSIS   Facial fracture, intracranial hemorrhage, electrolyte abnormality, volume overload, hypercapnia, hyperammonemia, pneumonia, viral illness  CLINICAL IMPRESSION / ASSESSMENT AND PLAN / ED COURSE  Medications ordered in the ED: Medications  insulin  aspart (novoLOG) injection 10 Units (has no administration in time range)  dextrose 50 % solution 100 mL (has no administration in time range)  calcium gluconate inj 10% (1 g) URGENT USE ONLY! (1 g Intravenous Given 05/08/21 0953)  sodium bicarbonate injection 50 mEq (50 mEq Intravenous Given 05/08/21 1003)    Pertinent labs & imaging results that were available during my care of the patient were reviewed by me and considered in my medical decision making (see chart for details).   [name] was evaluated in Emergency Department on 05/08/2021 for the symptoms described in the history of present illness. He was evaluated in the context of the global COVID-19 pandemic, which necessitated consideration that the patient might be at risk for infection with the SARS-CoV-2 virus that causes COVID-19. Institutional protocols and algorithms that pertain to the evaluation of patients at risk for COVID-19 are in a state of rapid change based on information released by regulatory bodies including the CDC and federal and state organizations. These policies and algorithms were followed during the patient's care in the ED.   Patient presents with altered mental status in the setting of recent trauma from a fall yesterday.  Has severe comorbidities.  Will check labs, CT head and neck, chest x-ray.  Clinical Course as of 05/08/21 1041  Mon May 08, 2021  0911 Nursing unable to obtain an IV.  I used ultrasound continuous visualization to place a peripheral IV to obtain blood samples. [PS]  6010 Chest x-ray and shoulder x-ray unremarkable.  CT head and cervical spine unremarkable. [PS]  1040 Left AC IV not flushing well.  Used ultrasound visualization to confirm that it had not infiltrated.  New IV was placed by me under ultrasound visualization. [PS]    Clinical Course User Index [PS] Joni Fears,  Doren Custard, MD    ----------------------------------------- 10:41 AM on  05/08/2021 -----------------------------------------  Labs show potassium of 7.2, pH of 7.1.  Patient given calcium and bicarb IV.  We will also give insulin and glucose.  Discussed with nephrology Dr. Zollie Scale for urgent dialysis.  Will contact hospitalist.   ____________________________________________   FINAL CLINICAL IMPRESSION(S) / ED DIAGNOSES    Final diagnoses:  Hyperkalemia  Stupor  ESRD on hemodialysis (HCC)  Chronic respiratory failure with hypoxia ([place])  Type 2 diabetes mellitus without complication, with long-term current use of insulin (HCC)  Chronic congestive heart failure, unspecified heart failure type [facility])     ED Discharge Orders    None      Portions of this note were generated with dragon dictation software. Dictation errors may occur despite best attempts at proofreading.   Carrie Mew, MD 05/08/21 1041

## 2021-05-08 NOTE — ED Notes (Signed)
Multiple IV attempts made by this RN and Arboriculturist without success

## 2021-05-08 NOTE — ED Notes (Signed)
Pt transported to CT and x-ray  

## 2021-05-08 NOTE — Progress Notes (Signed)
Pt in bed sleeping drowsy awakens with stimuli pt initially refused HD tx MD an NP talked with pt educated on risks

## 2021-05-08 NOTE — Progress Notes (Signed)
[facility]  ROUNDING NOTE   Subjective:   [name] is a 55 y.o. male with past medical history of CHF, GERD, diabetes, hypertension and ESRD requiring HD MWF. He presents to ED with altered mental status and s/p fall.  He is found to have a potassium of 7.2  We have been consulted to complete his dialysis and manage potassium. He is seen in the ED laying on a stretcher. He is drowsy and some con conversation is incoherent. He completed some of treatment at outpatient clinic, but was sent to ED for AMS.  Denies nausea and shortness of breath. States he has fluid on. Unable to respond to questions about fall.   Objective:  Vital signs in last 24 hours:  Temp:  [97.6 F (36.4 C)] 97.6 F (36.4 C) (05/23 0727) Pulse Rate:  [25-73] 48 (05/23 1445) Resp:  [10-24] 19 (05/23 1415) BP: (99-143)/(50-106) 130/57 (05/23 1445) SpO2:  [90 %-97 %] 95 % (05/23 1445)  Weight change:  There were no vitals filed for this visit.  Intake/Output: No intake/output data recorded.   Intake/Output this shift:  No intake/output data recorded.  Physical Exam: General: NAD, laying on stretcher  Head: Normocephalic, atraumatic. Moist oral mucosal membranes  Eyes: Anicteric, Left periorbital erythema and edema  Lungs:  Clear to auscultation  Heart: Regular rate and rhythm  Abdomen:  Soft, nontender,   Extremities:  min peripheral edema.  Neurologic: Nonfocal, moving all four extremities  Skin: No lesions  Access: Rt AVF    Basic Metabolic Panel: Recent Labs  Lab 05/08/21 0859  NA 135  K 7.2*  CL 99  CO2 20*  GLUCOSE 135*  BUN 94*  CREATININE 13.79*  CALCIUM 8.7*    Liver Function Tests: Recent Labs  Lab 05/08/21 0859  AST 13*  ALT 16  ALKPHOS 145*  BILITOT 0.9  PROT 8.6*  ALBUMIN 4.3   No results for input(s): LIPASE, AMYLASE in the last 168 hours. Recent Labs  Lab 05/08/21 0859  AMMONIA 25    CBC: Recent Labs  Lab 05/08/21 0859  WBC 13.8*   NEUTROABS 11.3*  HGB 11.6*  HCT 36.9*  MCV 91.6  PLT 226    Cardiac Enzymes: No results for input(s): CKTOTAL, CKMB, CKMBINDEX, TROPONINI in the last 168 hours.  BNP: Invalid input(s): POCBNP  CBG: Recent Labs  Lab 05/02/21 1355 05/02/21 1357 05/02/21 1522  GLUCAP 64* 51* 84    Microbiology: Results for orders placed or performed during the hospital encounter of 05/08/21  Resp Panel by RT-PCR (Flu A&B, Covid) Nasopharyngeal Swab     Status: None   Collection Time: 05/08/21  8:04 AM   Specimen: Nasopharyngeal Swab; Nasopharyngeal(NP) swabs in vial transport medium  Result Value Ref Range Status   SARS Coronavirus 2 by RT PCR NEGATIVE NEGATIVE Final    Comment: (NOTE) SARS-CoV-2 target nucleic acids are NOT DETECTED.  The SARS-CoV-2 RNA is generally detectable in upper respiratory specimens during the acute phase of infection. The lowest concentration of SARS-CoV-2 viral copies this assay can detect is 138 copies/mL. A negative result does not preclude SARS-Cov-2 infection and should not be used as the sole basis for treatment or other patient management decisions. A negative result may occur with  improper specimen collection/handling, submission of specimen other than nasopharyngeal swab, presence of viral mutation(s) within the areas targeted by this assay, and inadequate number of viral copies(<138 copies/mL). A negative result must be combined with clinical observations, patient history, and epidemiological information.  The expected result is Negative.  Fact Sheet for Patients:  EntrepreneurPulse.com.au  Fact Sheet for Healthcare Providers:  IncredibleEmployment.be  This test is no t yet approved or cleared by the [place] FDA and  has been authorized for detection and/or diagnosis of SARS-CoV-2 by FDA under an Emergency Use Authorization (EUA). This EUA will remain  in effect (meaning this test can be used) for the  duration of the COVID-19 declaration under Section 564(b)(1) of the Act, 21 U.S.C.section 360bbb-3(b)(1), unless the authorization is terminated  or revoked sooner.       Influenza A by PCR NEGATIVE NEGATIVE Final   Influenza B by PCR NEGATIVE NEGATIVE Final    Comment: (NOTE) The Xpert Xpress SARS-CoV-2/FLU/RSV plus assay is intended as an aid in the diagnosis of influenza from Nasopharyngeal swab specimens and should not be used as a sole basis for treatment. Nasal washings and aspirates are unacceptable for Xpert Xpress SARS-CoV-2/FLU/RSV testing.  Fact Sheet for Patients: EntrepreneurPulse.com.au  Fact Sheet for Healthcare Providers: IncredibleEmployment.be  This test is not yet approved or cleared by the [place] FDA and has been authorized for detection and/or diagnosis of SARS-CoV-2 by FDA under an Emergency Use Authorization (EUA). This EUA will remain in effect (meaning this test can be used) for the duration of the COVID-19 declaration under Section 564(b)(1) of the Act, 21 U.S.C. section 360bbb-3(b)(1), unless the authorization is terminated or revoked.  Performed at [facility], [address] [place], [place] [address]     Coagulation Studies: No results for input(s): LABPROT, INR in the last 72 hours.  Urinalysis: No results for input(s): COLORURINE, LABSPEC, PHURINE, GLUCOSEU, HGBUR, BILIRUBINUR, KETONESUR, PROTEINUR, UROBILINOGEN, NITRITE, LEUKOCYTESUR in the last 72 hours.  Invalid input(s): APPERANCEUR    Imaging: CT Head Wo Contrast  Result Date: 05/08/2021 CLINICAL DATA:  Altered mental status, history of fall yesterday, initial encounter EXAM: CT HEAD WITHOUT CONTRAST CT CERVICAL SPINE WITHOUT CONTRAST TECHNIQUE: Multidetector CT imaging of the head and cervical spine was performed following the standard protocol without intravenous contrast. Multiplanar CT image reconstructions of the cervical  spine were also generated. COMPARISON:  01/27/2021 FINDINGS: CT HEAD FINDINGS Brain: No evidence of acute infarction, hemorrhage, hydrocephalus, extra-axial collection or mass lesion/mass effect. Chronic atrophic and ischemic changes are again identified and stable. Vascular: No hyperdense vessel or unexpected calcification. Skull: Normal. Negative for fracture or focal lesion. Sinuses/Orbits: No acute finding. Other: None. CT CERVICAL SPINE FINDINGS Alignment: Normal. Skull base and vertebrae: 7 cervical segments are well visualized. Vertebral body height is well maintained. Disc space narrowing and osteophytic changes are noted. No acute fracture or acute facet abnormality is noted. Soft tissues and spinal canal: Surrounding soft tissue structures are within normal limits. Upper chest: Visualized lung apices are within normal limits. Other: None IMPRESSION: CT of the head: Chronic atrophic and ischemic changes without acute abnormality. CT of the cervical spine: Multilevel degenerative change without acute abnormality. Electronically Signed   By: Inez Catalina M.D.   On: 05/08/2021 08:54   CT CERVICAL SPINE WO CONTRAST  Result Date: 05/08/2021 CLINICAL DATA:  Altered mental status, history of fall yesterday, initial encounter EXAM: CT HEAD WITHOUT CONTRAST CT CERVICAL SPINE WITHOUT CONTRAST TECHNIQUE: Multidetector CT imaging of the head and cervical spine was performed following the standard protocol without intravenous contrast. Multiplanar CT image reconstructions of the cervical spine were also generated. COMPARISON:  01/27/2021 FINDINGS: CT HEAD FINDINGS Brain: No evidence of acute infarction, hemorrhage, hydrocephalus, extra-axial collection or mass lesion/mass  effect. Chronic atrophic and ischemic changes are again identified and stable. Vascular: No hyperdense vessel or unexpected calcification. Skull: Normal. Negative for fracture or focal lesion. Sinuses/Orbits: No acute finding. Other: None. CT  CERVICAL SPINE FINDINGS Alignment: Normal. Skull base and vertebrae: 7 cervical segments are well visualized. Vertebral body height is well maintained. Disc space narrowing and osteophytic changes are noted. No acute fracture or acute facet abnormality is noted. Soft tissues and spinal canal: Surrounding soft tissue structures are within normal limits. Upper chest: Visualized lung apices are within normal limits. Other: None IMPRESSION: CT of the head: Chronic atrophic and ischemic changes without acute abnormality. CT of the cervical spine: Multilevel degenerative change without acute abnormality. Electronically Signed   By: Inez Catalina M.D.   On: 05/08/2021 08:54   DG Chest Portable 1 View  Result Date: 05/08/2021 CLINICAL DATA:  Altered mental status, weakness, right shoulder pain. EXAM: PORTABLE CHEST 1 VIEW COMPARISON:  01/27/2021 and CT chest 11/23/2016. FINDINGS: Trachea is midline. Heart is enlarged. Central interstitial prominence and indistinctness. Thickening of the minor fissure. Slight blunting of the right costophrenic angle. IMPRESSION: Mild congestive heart failure. Difficult to exclude a viral or atypical pneumonia. Electronically Signed   By: Lorin Picket M.D.   On: 05/08/2021 08:11   DG Shoulder Right Portable  Result Date: 05/08/2021 CLINICAL DATA:  Right shoulder pain Altered mental status Weakness EXAM: PORTABLE RIGHT SHOULDER COMPARISON:  06/21/2019 FINDINGS: No fracture or dislocation. Mild spurring of the acromioclavicular joint. Multiple right axillary vascular stents are noted. IMPRESSION: Mild right AC joint osteoarthrosis. Electronically Signed   By: Miachel Roux M.D.   On: 05/08/2021 08:13     Medications:    . [START ON 05/09/2021] allopurinol  200 mg Oral q AM  . [START ON 05/09/2021] aspirin EC  81 mg Oral q AM  . [START ON 05/09/2021] atorvastatin  40 mg Oral q AM  . calcium acetate  667 mg Oral TID WC  . [START ON 05/09/2021] Chlorhexidine Gluconate Cloth  6 each  Topical Q0600  . [START ON 05/09/2021] cholecalciferol  1,000 Units Oral Daily  . furosemide  80 mg Oral Daily  . heparin  5,000 Units Subcutaneous Q8H  . insulin aspart  0-5 Units Subcutaneous QHS  . insulin aspart  0-9 Units Subcutaneous TID WC  . insulin glargine  10 Units Subcutaneous QHS  . isosorbide mononitrate  60 mg Oral Daily  . [START ON 05/09/2021] multivitamin with minerals  1 tablet Oral q AM  . pantoprazole  40 mg Oral Daily  . umeclidinium-vilanterol  1 puff Inhalation Daily   acetaminophen, albuterol, cyclobenzaprine, dextromethorphan-guaiFENesin, hydrALAZINE, hydrOXYzine  Assessment/ Plan:  Mr. TAJUAN DUFAULT is a 55 y.o.  male with past medical history of CHF, GERD, diabetes, hypertension and ESRD requiring HD MWF. He presents to ED with altered mental status and s/p fall.   [facility]/ MWF/ Lt AVF/121kg  1. End Stage Renal Disease with hyperkalemia on hemodialysis:  Potassium 7.2 on arrival Received partial treatment at outpatient clinic Will perform urgent dialysis today to reduce potassium Will monitor potassium after treatment   2. Anemia of chronic kidney disease   Lab Results  Component Value Date   HGB 11.6 (L) 05/08/2021  EPO and Hectorol outpatient Hgb at goal Will continue to monitor   3. Secondary Hyperparathyroidism:  Phosphate binders:  Calcimimetics:  Vitamin D agent:  Lab Results  Component Value Date   PTH 369 (H) 06/16/2019   CALCIUM 8.7 (  L) 05/08/2021   CAION 1.11 (L) 02/24/2020   PHOS 4.4 01/30/2021  Calcium low.  Will order phosphorus with morning labs Calcium acetate with meals   4. Diabetes mellitus type II with chronic kidney disease Home regimen includes Dulaglutide, glimepiride, and [place]. Most recent hemoglobin A1c is 5.6 on 01/28/21.  Will monitor glucose levels Primary team will manage SSi    LOS: 0   5/23/20222:53 PM

## 2021-05-09 DIAGNOSIS — E875 Hyperkalemia: Secondary | ICD-10-CM | POA: Diagnosis not present

## 2021-05-09 LAB — CBC
HCT: 33.9 % — ABNORMAL LOW (ref 39.0–52.0)
Hemoglobin: 10.9 g/dL — ABNORMAL LOW (ref 13.0–17.0)
MCH: 29.1 pg (ref 26.0–34.0)
MCHC: 32.2 g/dL (ref 30.0–36.0)
MCV: 90.6 fL (ref 80.0–100.0)
Platelets: 193 10*3/uL (ref 150–400)
RBC: 3.74 MIL/uL — ABNORMAL LOW (ref 4.22–5.81)
RDW: 14.6 % (ref 11.5–15.5)
WBC: 14.2 10*3/uL — ABNORMAL HIGH (ref 4.0–10.5)
nRBC: 0 % (ref 0.0–0.2)

## 2021-05-09 LAB — BLOOD GAS, ARTERIAL
Acid-base deficit: 1.4 mmol/L (ref 0.0–2.0)
Bicarbonate: 25.2 mmol/L (ref 20.0–28.0)
FIO2: 0.8
MECHVT: 450 mL
Mechanical Rate: 20
O2 Saturation: 98.5 %
PEEP: 12 cmH2O
Patient temperature: 37
pCO2 arterial: 50 mmHg — ABNORMAL HIGH (ref 32.0–48.0)
pH, Arterial: 7.31 — ABNORMAL LOW (ref 7.350–7.450)
pO2, Arterial: 124 mmHg — ABNORMAL HIGH (ref 83.0–108.0)

## 2021-05-09 LAB — LIPID PANEL
Cholesterol: 100 mg/dL (ref 0–200)
HDL: 24 mg/dL — ABNORMAL LOW (ref >40)
LDL Cholesterol: 53 mg/dL (ref 0–99)
Total CHOL/HDL Ratio: 4.2 RATIO
Triglycerides: 117 mg/dL (ref <150)
VLDL: 23 mg/dL (ref 0–40)

## 2021-05-09 LAB — BASIC METABOLIC PANEL
Anion gap: 15 (ref 5–15)
BUN: 69 mg/dL — ABNORMAL HIGH (ref 6–20)
CO2: 23 mmol/L (ref 22–32)
Calcium: 8.5 mg/dL — ABNORMAL LOW (ref 8.9–10.3)
Chloride: 99 mmol/L (ref 98–111)
Creatinine, Ser: 10.65 mg/dL — ABNORMAL HIGH (ref 0.61–1.24)
GFR, Estimated: 5 mL/min — ABNORMAL LOW (ref >60)
Glucose, Bld: 65 mg/dL — ABNORMAL LOW (ref 70–99)
Potassium: 5.8 mmol/L — ABNORMAL HIGH (ref 3.5–5.1)
Sodium: 137 mmol/L (ref 135–145)

## 2021-05-09 LAB — BASIC METABOLIC PANEL WITH GFR
Anion gap: 17 — ABNORMAL HIGH (ref 5–15)
BUN: 55 mg/dL — ABNORMAL HIGH (ref 6–20)
CO2: 22 mmol/L (ref 22–32)
Calcium: 8.5 mg/dL — ABNORMAL LOW (ref 8.9–10.3)
Chloride: 98 mmol/L (ref 98–111)
Creatinine, Ser: 8.96 mg/dL — ABNORMAL HIGH (ref 0.61–1.24)
GFR, Estimated: 7 mL/min — ABNORMAL LOW
Glucose, Bld: 101 mg/dL — ABNORMAL HIGH (ref 70–99)
Potassium: 4 mmol/L (ref 3.5–5.1)
Sodium: 137 mmol/L (ref 135–145)

## 2021-05-09 LAB — HEMOGLOBIN A1C
Hgb A1c MFr Bld: 7.1 % — ABNORMAL HIGH (ref 4.8–5.6)
Mean Plasma Glucose: 157 mg/dL

## 2021-05-09 LAB — GLUCOSE, CAPILLARY
Glucose-Capillary: 78 mg/dL (ref 70–99)
Glucose-Capillary: 135 mg/dL — ABNORMAL HIGH (ref 70–99)
Glucose-Capillary: 52 mg/dL — ABNORMAL LOW (ref 70–99)
Glucose-Capillary: 76 mg/dL (ref 70–99)
Glucose-Capillary: 59 mg/dL — ABNORMAL LOW (ref 70–99)
Glucose-Capillary: 72 mg/dL (ref 70–99)
Glucose-Capillary: 72 mg/dL (ref 70–99)

## 2021-05-09 LAB — CBC WITH DIFFERENTIAL/PLATELET
Abs Immature Granulocytes: 0.08 10*3/uL — ABNORMAL HIGH (ref 0.00–0.07)
Basophils Absolute: 0 10*3/uL (ref 0.0–0.1)
Basophils Relative: 0 %
Eosinophils Absolute: 0.1 10*3/uL (ref 0.0–0.5)
Eosinophils Relative: 0 %
HCT: 36 % — ABNORMAL LOW (ref 39.0–52.0)
Hemoglobin: 11.4 g/dL — ABNORMAL LOW (ref 13.0–17.0)
Immature Granulocytes: 1 %
Lymphocytes Relative: 14 %
Lymphs Abs: 2.1 10*3/uL (ref 0.7–4.0)
MCH: 28.7 pg (ref 26.0–34.0)
MCHC: 31.7 g/dL (ref 30.0–36.0)
MCV: 90.7 fL (ref 80.0–100.0)
Monocytes Absolute: 1.4 10*3/uL — ABNORMAL HIGH (ref 0.1–1.0)
Monocytes Relative: 9 %
Neutro Abs: 11.3 10*3/uL — ABNORMAL HIGH (ref 1.7–7.7)
Neutrophils Relative %: 76 %
Platelets: 211 10*3/uL (ref 150–400)
RBC: 3.97 MIL/uL — ABNORMAL LOW (ref 4.22–5.81)
RDW: 14.7 % (ref 11.5–15.5)
WBC: 15 10*3/uL — ABNORMAL HIGH (ref 4.0–10.5)
nRBC: 0.2 % (ref 0.0–0.2)

## 2021-05-09 LAB — TYPE AND SCREEN
ABO/RH(D): O POS
Antibody Screen: NEGATIVE

## 2021-05-09 LAB — PHOSPHORUS
Phosphorus: 8.5 mg/dL — ABNORMAL HIGH (ref 2.5–4.6)
Phosphorus: 5.7 mg/dL — ABNORMAL HIGH (ref 2.5–4.6)

## 2021-05-09 LAB — MAGNESIUM
Magnesium: 1.6 mg/dL — ABNORMAL LOW (ref 1.7–2.4)
Magnesium: 1.5 mg/dL — ABNORMAL LOW (ref 1.7–2.4)

## 2021-05-09 MED ORDER — PENTAFLUOROPROP-TETRAFLUOROETH EX AERO
1.0000 "application " | INHALATION_SPRAY | CUTANEOUS | Status: DC | PRN
  Filled 2021-05-09: qty 30

## 2021-05-09 MED ORDER — ASPIRIN 81 MG PO CHEW
81.0000 mg | CHEWABLE_TABLET | Freq: Every day | ORAL | Status: DC
  Administered 2021-05-09 – 2021-05-14 (×6): 81 mg
  Filled 2021-05-09 (×6): qty 1

## 2021-05-09 MED ORDER — PROSOURCE TF PO LIQD
90.0000 mL | Freq: Three times a day (TID) | ORAL | Status: DC
  Administered 2021-05-09 – 2021-05-14 (×14): 90 mL
  Filled 2021-05-09 (×16): qty 90

## 2021-05-09 MED ORDER — ADULT MULTIVITAMIN W/MINERALS CH: 1.0000 | ORAL_TABLET | Freq: Every morning | ORAL | Status: DC

## 2021-05-09 MED ORDER — CYCLOBENZAPRINE HCL 5 MG PO TABS
5.0000 mg | ORAL_TABLET | Freq: Two times a day (BID) | ORAL | Status: DC | PRN
  Filled 2021-05-09: qty 1

## 2021-05-09 MED ORDER — HEPARIN SODIUM (PORCINE) 1000 UNIT/ML DIALYSIS
1000.0000 [IU] | INTRAMUSCULAR | Status: DC | PRN
  Administered 2021-05-09: 1000 [IU] via INTRAVENOUS_CENTRAL
  Administered 2021-05-17: 2000 [IU] via INTRAVENOUS_CENTRAL
  Filled 2021-05-09 (×2): qty 1

## 2021-05-09 MED ORDER — SODIUM CHLORIDE 0.9 % IV SOLN: 100.0000 mL | INTRAVENOUS | Status: DC | PRN

## 2021-05-09 MED ORDER — ALTEPLASE 2 MG IJ SOLR: 2.0000 mg | Freq: Once | INTRAMUSCULAR | Status: DC | PRN

## 2021-05-09 MED ORDER — MAGNESIUM SULFATE 4 GM/100ML IV SOLN
4.0000 g | Freq: Once | INTRAVENOUS | Status: AC
  Administered 2021-05-10: 4 g via INTRAVENOUS
  Filled 2021-05-09: qty 100

## 2021-05-09 MED ORDER — LIDOCAINE HCL (PF) 1 % IJ SOLN
5.0000 mL | INTRAMUSCULAR | Status: DC | PRN
  Filled 2021-05-09: qty 5

## 2021-05-09 MED ORDER — MIDAZOLAM 50MG/50ML (1MG/ML) PREMIX INFUSION
0.5000 mg/h | INTRAVENOUS | Status: DC
  Administered 2021-05-10: 0.5 mg/h via INTRAVENOUS
  Administered 2021-05-10 – 2021-05-12 (×5): 4 mg/h via INTRAVENOUS
  Administered 2021-05-13 (×2): 3 mg/h via INTRAVENOUS
  Filled 2021-05-09 (×8): qty 50

## 2021-05-09 MED ORDER — LIDOCAINE-PRILOCAINE 2.5-2.5 % EX CREA
1.0000 "application " | TOPICAL_CREAM | CUTANEOUS | Status: DC | PRN
  Administered 2021-05-19: 1 via TOPICAL
  Filled 2021-05-09 (×2): qty 5

## 2021-05-09 MED ORDER — DEXTROSE 50 % IV SOLN
1.0000 | Freq: Once | INTRAVENOUS | Status: AC
  Administered 2021-05-09: 50 mL via INTRAVENOUS
  Filled 2021-05-09: qty 50

## 2021-05-09 MED ORDER — RENA-VITE PO TABS
1.0000 | ORAL_TABLET | Freq: Every day | ORAL | Status: DC
  Administered 2021-05-09 – 2021-05-13 (×5): 1
  Filled 2021-05-09 (×5): qty 1

## 2021-05-09 MED ORDER — FREE WATER
30.0000 mL | Status: DC
  Administered 2021-05-09 – 2021-05-14 (×30): 30 mL

## 2021-05-09 MED ORDER — ALLOPURINOL 100 MG PO TABS
200.0000 mg | ORAL_TABLET | Freq: Every morning | ORAL | Status: DC
  Administered 2021-05-10 – 2021-05-14 (×5): 200 mg
  Filled 2021-05-09 (×6): qty 2

## 2021-05-09 MED ORDER — VITAL 1.5 CAL PO LIQD
1000.0000 mL | ORAL | Status: DC
  Administered 2021-05-09 – 2021-05-13 (×4): 1000 mL

## 2021-05-09 NOTE — Progress Notes (Addendum)
Pressures remain very high at -240 arterial and 220 venous, machine nearly constantly alarming.disonnected pt from lines temporarily to attempt push pull of lines. Pressures improved slightly after push pull flush of fistula. Increased BFR to 300 slowly to monitor pressures, will run at 300 now and monitor pressures. No s/s of infiltration or swelling noted to site.

## 2021-05-09 NOTE — Progress Notes (Signed)
BFR turned down to 250 due to increasing pressures

## 2021-05-09 NOTE — Progress Notes (Signed)
GOALS OF CARE DISCUSSION  The Clinical status was relayed to family in detail. Daughter [name] Updated and notified of patients medical condition.    Patient remains unresponsive and will not open eyes to command.   Patient is having a weak cough and struggling to remove secretions.   Patient with increased WOB and using accessory muscles to breathe Explained to family course of therapy and the modalities    Patient with Progressive multiorgan failure with a very high probablity of a very minimal chance of meaningful recovery despite all aggressive and optimal medical therapy.  PATIENT REMAINS FULL CODE  Daughter  understands the situation. Patient with severe hypoxia Therapy for pneumonia started Starting HD  Patient is critically ill MRI brain pending too sick to travel  Family are satisfied with Plan of action and management. All questions answered  Additional CC time 32 mins   Breanda Greenlaw Patricia Pesa, M.D.  [facility]  Medical Director [place] Director [facility] Cardio-Pulmonary Department

## 2021-05-09 NOTE — Progress Notes (Signed)
BFR decreased to 150 due to arterial pressures alarming and venous pressures alarming near constantly despite push pull flushes, heparin and ns flushes via lines.

## 2021-05-09 NOTE — Progress Notes (Addendum)
Initial Nutrition Assessment  DOCUMENTATION CODES:   Morbid obesity  INTERVENTION:   Vital 1.5 @45ml  + Pro-Source 58ml TID via tube, provides 40kcal and 11g of protein per serving   Free water flushes 52ml q4 hours to maintain tube patency   Regimen provides 1860kcal/day, 139g/day protein and 1061ml/day free water   Rena-vit daily via tube   NUTRITION DIAGNOSIS:   Inadequate oral intake related to inability to eat (pt sedated and ventilated) as evidenced by NPO status.  GOAL:   Provide needs based on ASPEN/SCCM guidelines  MONITOR:   Vent status,Labs,Weight trends,TF tolerance,Skin,I & O's  REASON FOR ASSESSMENT:   Ventilator    ASSESSMENT:   55 y/o male with h/o OSA, CMO, DM, HLD, HTN, CAD, ESRD on HD, CHF and COPD who is admitted with AMS, SOB, fall and hyperkalemia   Pt sedated and ventilated. OGT in place. Plan is to start tube feeds today. RD suspects pt with good appetite and oral intake at baseline as pt appears weight stable at baseline.   Medications reviewed and include: allopurinol, aspirin, vitamin D, colace, lasix, heparin, insulin, MVI, protonix, miralax, unasyn, precedex, pepcid, fentanyl  Labs reviewed: K 5.8(H), BUN 69(L), creat 10.65(H), P 8.5(H), Mg 1.6(L) Wbc- 14.2(H), Hgb 10.9(L), Hct 33.9(L) cbgs- 78, 52, 135, 76 x 24 hrs AIC 5.6- 2/12  Patient is currently intubated on ventilator support MV: 10.0 L/min Temp (24hrs), Avg:98.6 F (37 C), Min:95 F (35 C), Max:100.58 F (38.1 C)  Propofol: none   MAP- >38mmHg  NUTRITION - FOCUSED PHYSICAL EXAM:  Flowsheet Row Most Recent Value  Orbital Region No depletion  Upper Arm Region No depletion  Thoracic and Lumbar Region No depletion  Buccal Region No depletion  Temple Region No depletion  Clavicle Bone Region No depletion  Clavicle and Acromion Bone Region No depletion  Scapular Bone Region No depletion  Dorsal Hand No depletion  Patellar Region No depletion  Anterior Thigh Region No  depletion  Posterior Calf Region No depletion  Edema (RD Assessment) None  Hair Reviewed  Eyes Reviewed  Mouth Reviewed  Skin Reviewed  Nails Reviewed     Diet Order:   Diet Order            Diet NPO time specified  Diet effective now                EDUCATION NEEDS:   No education needs have been identified at this time  Skin:  Skin Assessment: Reviewed RN Assessment (ecchymosis)  Last BM:  pta  Height:   Ht Readings from Last 1 Encounters:  05/08/21 5\' 3"  (1.6 m)    Weight:   Wt Readings from Last 1 Encounters:  05/09/21 123.8 kg    Ideal Body Weight:  56.3 kg  BMI:  Body mass index is 48.35 kg/m.  Estimated Nutritional Needs:   Kcal:  1362-1733kcal/day  Protein:  130-140g/day  Fluid:  UOP +1L  Koleen Distance MS, RD, LDN Please refer to [facility] for RD and/or RD on-call/weekend/after hours pager

## 2021-05-09 NOTE — Progress Notes (Signed)
NAME:  [name], MRN:  [id number], DOB:  [birth year], LOS: 1 ADMISSION DATE:  05/08/2021  BRIEF SYNOPSIS  acute and severe mental status changes and severe hypoxia   History of Present Illness:   55 y.o.malewith a history of dCHF, ESRD on hemodialysis COPD, diabetes morbid obesity who presents to ER with severe altered mental status. Had a fall hitting the left side of his forehead yesterday.   Went to dialysis this morning where he was noted to be somnolent, poorly interactive, not able to follow commands.  + headache.    Pt unable to complete dialysis today, per EMS pt had fall yesterday, at dialysis patient kept falling asleep, unable to determine baseline mentation due to being a new dialysis site. Pt with bruising and swelling present to left eye. Pt also complains of back pain and a headache.  Patient subsequently developed acute SOB and hypoxia failed biPAP and emergently intubated   Micro Data:  COVID NEG INF A/B NEG   Antibiotics Given (last 72 hours)    Date/Time Action Medication Dose Rate   05/09/21 0017 New Bag/Given   Ampicillin-Sulbactam (UNASYN) 3 g in sodium chloride 0.9 % 100 mL IVPB 3 g 200 mL/hr        Significant Hospital Events: Including procedures, antibiotic start and stop dates in addition to other pertinent events    5/23 admitted to ICU for severe hypoxia and SOB, k=7.2, 5/23 intubated severe encephalopathy  5/24 intubated, severe encephalopathy, severe hypoxia fio2 80%    Interim History / Subjective:  Remains critically ill Started ABX for aspiration pneumonia Severe hypoxia  Vent Mode: PRVC FiO2 (%):  [40 %-80 %] 80 % Set Rate:  [20 bmp-22 bmp] 22 bmp Vt Set:  [450 mL] 450 mL PEEP:  [5 cmH20-12 cmH20] 10 cmH20       Objective   Blood pressure 131/89, pulse 72, temperature (!) 100.4 F (38 C), resp. rate (!) 22, height 5\' 3"  (1.6 m), weight 123.8 kg, SpO2 98 %.    Vent Mode: PRVC FiO2 (%):  [40 %-80  %] 80 % Set Rate:  [20 bmp-22 bmp] 22 bmp Vt Set:  [450 mL] 450 mL PEEP:  [5 cmH20-12 cmH20] 10 cmH20   Intake/Output Summary (Last 24 hours) at 05/09/2021 0735 Last data filed at 05/09/2021 9211 Gross per 24 hour  Intake 522.9 ml  Output 2260 ml  Net -1737.1 ml   Filed Weights   05/09/21 0439  Weight: 123.8 kg      REVIEW OF SYSTEMS  PATIENT IS UNABLE TO PROVIDE COMPLETE REVIEW OF SYSTEMS DUE TO SEVERE CRITICAL ILLNESS AND TOXIC METABOLIC ENCEPHALOPATHY   PHYSICAL EXAMINATION:  GENERAL:critically ill appearing, +resp distress HEAD: Normocephalic, atraumatic.  EYES: Pupils equal, round, reactive to light.  No scleral icterus.  MOUTH: Moist mucosal membrane. NECK: Supple. PULMONARY: +rhonchi, +wheezing CARDIOVASCULAR: S1 and S2. Regular rate and rhythm. No murmurs, rubs, or gallops.  GASTROINTESTINAL: Soft, nontender, -distended. Positive bowel sounds.  MUSCULOSKELETAL: No swelling, clubbing, or edema.  NEUROLOGIC: obtunded SKIN:intact,warm,dry   Labs/imaging that I havepersonally reviewed  (right click and "Reselect all SmartList Selections" daily)       ASSESSMENT AND PLAN SYNOPSIS   55 yo morbidly obese AAM with severe acute hypoxic respiratory failure with severe toxic metabolic encephalopathy with acute diastolic heart failure  progressive cardio-renal syndrome, started therapy for aspiration pneumonia   Severe ACUTE Hypoxic and Hypercapnic Respiratory Failure -continue Mechanical Ventilator support -continue Bronchodilator Therapy -Wean Fio2 and PEEP as tolerated -  VAP/VENT bundle implementation Severe hypoxia unable to wean   CARDIAC FAILURE-acute diastolic dysfunction Progressive cardiorenal syndrome Vent support HD support   CARDIAC ICU monitoring   CHRONIC KIDNEY INJURY/Renal Failure ESRD  -continue Foley Catheter-assess need -Avoid nephrotoxic agents -Follow urine output, BMP -Ensure adequate renal perfusion, optimize  oxygenation -Renal dose medications HD a sneeded   NEUROLOGY Acute toxic metabolic encephalopathy, need for sedation Goal RASS -2 to -3    INFECTIOUS DISEASE THERAPY FOR ASPIRATION PNEUMONIA -continue antibiotics as prescribed -follow up cultures   ENDO - ICU hypoglycemic\Hyperglycemia protocol -check FSBS per protocol   GI GI PROPHYLAXIS as indicated  NUTRITIONAL STATUS DIET-->TF's as tolerated Constipation protocol as indicated   ELECTROLYTES -follow labs as needed -replace as needed -pharmacy consultation and following      Best practice (right click and "Reselect all SmartList Selections" daily)  Diet:  NPO Pain/Anxiety/Delirium protocol (if indicated): Yes (RASS goal -2) VAP protocol (if indicated): Yes DVT prophylaxis: Subcutaneous Heparin GI prophylaxis: H2B Glucose control:  SSI Yes Central venous access:  N/A Arterial line:  N/A Foley:  N/A Mobility:  bed rest  PT consulted: N/A Code Status:  full code Disposition: ICU   Labs   CBC: Recent Labs  Lab 05/08/21 0859 05/09/21 0453  WBC 13.8* 14.2*  NEUTROABS 11.3*  --   HGB 11.6* 10.9*  HCT 36.9* 33.9*  MCV 91.6 90.6  PLT 226 270    Basic Metabolic Panel: Recent Labs  Lab 05/08/21 0859 05/08/21 1940 05/09/21 0453  NA 135 136 137  K 7.2* 5.9* 5.8*  CL 99 96* 99  CO2 20* 25 23  GLUCOSE 135* 95 65*  BUN 94* 61* 69*  CREATININE 13.79* 9.26* 10.65*  CALCIUM 8.7* 8.3* 8.5*  MG  --   --  1.6*  PHOS  --   --  8.5*   GFR: Estimated Creatinine Clearance: 9.6 mL/min (A) (by C-G formula based on SCr of 10.65 mg/dL (H)). Recent Labs  Lab 05/08/21 0859 05/09/21 0453  WBC 13.8* 14.2*    Liver Function Tests: Recent Labs  Lab 05/08/21 0859  AST 13*  ALT 16  ALKPHOS 145*  BILITOT 0.9  PROT 8.6*  ALBUMIN 4.3   No results for input(s): LIPASE, AMYLASE in the last 168 hours. Recent Labs  Lab 05/08/21 0859  AMMONIA 25    ABG    Component Value Date/Time   PHART 7.31  (L) 05/09/2021 0434   PCO2ART 50 (H) 05/09/2021 0434   PO2ART 124 (H) 05/09/2021 0434   HCO3 25.2 05/09/2021 0434   TCO2 30 02/24/2020 0635   ACIDBASEDEF 1.4 05/09/2021 0434   O2SAT 98.5 05/09/2021 0434     Coagulation Profile: No results for input(s): INR, PROTIME in the last 168 hours.  Cardiac Enzymes: No results for input(s): CKTOTAL, CKMB, CKMBINDEX, TROPONINI in the last 168 hours.  HbA1C: Hgb A1c MFr Bld  Date/Time Value Ref Range Status  01/28/2021 01:24 AM 5.6 4.8 - 5.6 % Final    Comment:    (NOTE) Pre diabetes:          5.7%-6.4%  Diabetes:              >6.4%  Glycemic control for   <7.0% adults with diabetes   06/18/2019 10:34 AM 14.7 (H) 4.8 - 5.6 % Final    Comment:    (NOTE) Pre diabetes:          5.7%-6.4% Diabetes:              >  6.4% Glycemic control for   <7.0% adults with diabetes     CBG: Recent Labs  Lab 05/02/21 1522 05/08/21 1532 05/08/21 1819 05/08/21 2334 05/09/21 0410  GLUCAP 84 111* 96 92 78    Allergies Allergies  Allergen Reactions  . Shellfish Allergy Anaphylaxis    Face and throat swelling, difficulty breathing Allergy can be triggered by touching (contact)  . Other   . Betadine [Povidone Iodine] Rash  . Povidone-Iodine Rash       DVT/GI PRX  assessed I Assessed the need for Labs I Assessed the need for Foley I Assessed the need for Central Venous Line Family Discussion when available I Assessed the need for Mobilization I made an Assessment of medications to be adjusted accordingly Safety Risk assessment completed  CASE DISCUSSED IN MULTIDISCIPLINARY ROUNDS WITH ICU TEAM     Critical Care Time devoted to patient care services described in this note is 50  minutes.  Critical care was necessary to treat or prevent imminent or life-threatening deterioration. Overall, patient is critically ill, prognosis is guarded.  Patient with Multiorgan failure and at high risk for cardiac arrest and death.    Corrin Parker, M.D.  [facility]  Medical Director [place] Director [facility] Cardio-Pulmonary Department

## 2021-05-09 NOTE — Progress Notes (Signed)
BFR decreased due to increased venous and arterial pressure, 180mL ns flush provided and added to UF goal.

## 2021-05-09 NOTE — Progress Notes (Signed)
[facility]  ROUNDING NOTE   Subjective:   Patient with decompensation yesterday. Now intubated and sedated. Became lethargic postdialysis yesterday. Hyperkalemia partially improved and potassium down to 5.8.  Objective:  Vital signs in last 24 hours:  Temp:  [95 F (35 C)-100.4 F (38 C)] 100.4 F (38 C) (05/24 0700) Pulse Rate:  [46-97] 72 (05/24 0700) Resp:  [8-24] 22 (05/24 0700) BP: (80-152)/(50-123) 131/89 (05/24 0700) SpO2:  [82 %-98 %] 98 % (05/24 0700) FiO2 (%):  [40 %-80 %] 80 % (05/24 0446) Weight:  [123.8 kg] 123.8 kg (05/24 0439)  Weight change:  Filed Weights   05/09/21 0439  Weight: 123.8 kg    Intake/Output: I/O last 3 completed shifts: In: 522.9 [I.V.:377.8; IV Piggyback:145.1] Out: 2260 [Other:2260]   Intake/Output this shift:  No intake/output data recorded.  Physical Exam: General: Critically ill-appearing  Head: Normocephalic, atraumatic. Moist oral mucosal membranes  Eyes: Anicteric, Left periorbital erythema and edema  Lungs:  Scattered rhonchi, vent assisted  Heart: Regular rate and rhythm  Abdomen:  Soft, nontender, BS present  Extremities: 1+ lower extremity edema  Neurologic: Intubated, sedated  Skin: Rash noted b/l LE's  Access: Rt AVF    Basic Metabolic Panel: Recent Labs  Lab 05/08/21 0859 05/08/21 1940 05/09/21 0453  NA 135 136 137  K 7.2* 5.9* 5.8*  CL 99 96* 99  CO2 20* 25 23  GLUCOSE 135* 95 65*  BUN 94* 61* 69*  CREATININE 13.79* 9.26* 10.65*  CALCIUM 8.7* 8.3* 8.5*  MG  --   --  1.6*  PHOS  --   --  8.5*    Liver Function Tests: Recent Labs  Lab 05/08/21 0859  AST 13*  ALT 16  ALKPHOS 145*  BILITOT 0.9  PROT 8.6*  ALBUMIN 4.3   No results for input(s): LIPASE, AMYLASE in the last 168 hours. Recent Labs  Lab 05/08/21 0859  AMMONIA 25    CBC: Recent Labs  Lab 05/08/21 0859 05/09/21 0453  WBC 13.8* 14.2*  NEUTROABS 11.3*  --   HGB 11.6* 10.9*  HCT 36.9* 33.9*  MCV 91.6 90.6   PLT 226 193    Cardiac Enzymes: No results for input(s): CKTOTAL, CKMB, CKMBINDEX, TROPONINI in the last 168 hours.  BNP: Invalid input(s): POCBNP  CBG: Recent Labs  Lab 05/02/21 1522 05/08/21 1532 05/08/21 1819 05/08/21 2334 05/09/21 0410  GLUCAP 84 111* 96 92 78    Microbiology: Results for orders placed or performed during the hospital encounter of 05/08/21  Resp Panel by RT-PCR (Flu A&B, Covid) Nasopharyngeal Swab     Status: None   Collection Time: 05/08/21  8:04 AM   Specimen: Nasopharyngeal Swab; Nasopharyngeal(NP) swabs in vial transport medium  Result Value Ref Range Status   SARS Coronavirus 2 by RT PCR NEGATIVE NEGATIVE Final    Comment: (NOTE) SARS-CoV-2 target nucleic acids are NOT DETECTED.  The SARS-CoV-2 RNA is generally detectable in upper respiratory specimens during the acute phase of infection. The lowest concentration of SARS-CoV-2 viral copies this assay can detect is 138 copies/mL. A negative result does not preclude SARS-Cov-2 infection and should not be used as the sole basis for treatment or other patient management decisions. A negative result may occur with  improper specimen collection/handling, submission of specimen other than nasopharyngeal swab, presence of viral mutation(s) within the areas targeted by this assay, and inadequate number of viral copies(<138 copies/mL). A negative result must be combined with clinical observations, patient history, and epidemiological information. The  expected result is Negative.  Fact Sheet for Patients:  EntrepreneurPulse.com.au  Fact Sheet for Healthcare Providers:  IncredibleEmployment.be  This test is no t yet approved or cleared by the [place] FDA and  has been authorized for detection and/or diagnosis of SARS-CoV-2 by FDA under an Emergency Use Authorization (EUA). This EUA will remain  in effect (meaning this test can be used) for the duration of  the COVID-19 declaration under Section 564(b)(1) of the Act, 21 U.S.C.section 360bbb-3(b)(1), unless the authorization is terminated  or revoked sooner.       Influenza A by PCR NEGATIVE NEGATIVE Final   Influenza B by PCR NEGATIVE NEGATIVE Final    Comment: (NOTE) The Xpert Xpress SARS-CoV-2/FLU/RSV plus assay is intended as an aid in the diagnosis of influenza from Nasopharyngeal swab specimens and should not be used as a sole basis for treatment. Nasal washings and aspirates are unacceptable for Xpert Xpress SARS-CoV-2/FLU/RSV testing.  Fact Sheet for Patients: EntrepreneurPulse.com.au  Fact Sheet for Healthcare Providers: IncredibleEmployment.be  This test is not yet approved or cleared by the [place] FDA and has been authorized for detection and/or diagnosis of SARS-CoV-2 by FDA under an Emergency Use Authorization (EUA). This EUA will remain in effect (meaning this test can be used) for the duration of the COVID-19 declaration under Section 564(b)(1) of the Act, 21 U.S.C. section 360bbb-3(b)(1), unless the authorization is terminated or revoked.  Performed at [facility], [address] [place], [place] [address]   Blood culture (single)     Status: None (Preliminary result)   Collection Time: 05/08/21  8:59 AM   Specimen: BLOOD  Result Value Ref Range Status   Specimen Description BLOOD RIGHT [facility]  Final   Special Requests   Final    BOTTLES DRAWN AEROBIC AND ANAEROBIC Blood Culture results may not be optimal due to an excessive volume of blood received in culture bottles   Culture   Final    NO GROWTH < 12 HOURS Performed at [facility], [address], [place], [place] [address]    Report Status PENDING  Incomplete  MRSA PCR Screening     Status: None   Collection Time: 05/08/21  6:50 PM   Specimen: Nasopharyngeal  Result Value Ref Range Status   MRSA by PCR NEGATIVE NEGATIVE Final    Comment:         The GeneXpert MRSA Assay (FDA approved for NASAL specimens only), is one component of a comprehensive MRSA colonization surveillance program. It is not intended to diagnose MRSA infection nor to guide or monitor treatment for MRSA infections. Performed at [facility], [address] [place],  [address]     Coagulation Studies: No results for input(s): LABPROT, INR in the last 72 hours.  Urinalysis: No results for input(s): COLORURINE, LABSPEC, PHURINE, GLUCOSEU, HGBUR, BILIRUBINUR, KETONESUR, PROTEINUR, UROBILINOGEN, NITRITE, LEUKOCYTESUR in the last 72 hours.  Invalid input(s): APPERANCEUR    Imaging: CT Head Wo Contrast  Result Date: 05/08/2021 CLINICAL DATA:  Altered mental status, history of fall yesterday, initial encounter EXAM: CT HEAD WITHOUT CONTRAST CT CERVICAL SPINE WITHOUT CONTRAST TECHNIQUE: Multidetector CT imaging of the head and cervical spine was performed following the standard protocol without intravenous contrast. Multiplanar CT image reconstructions of the cervical spine were also generated. COMPARISON:  01/27/2021 FINDINGS: CT HEAD FINDINGS Brain: No evidence of acute infarction, hemorrhage, hydrocephalus, extra-axial collection or mass lesion/mass effect. Chronic atrophic and ischemic changes are again identified and stable. Vascular: No hyperdense  vessel or unexpected calcification. Skull: Normal. Negative for fracture or focal lesion. Sinuses/Orbits: No acute finding. Other: None. CT CERVICAL SPINE FINDINGS Alignment: Normal. Skull base and vertebrae: 7 cervical segments are well visualized. Vertebral body height is well maintained. Disc space narrowing and osteophytic changes are noted. No acute fracture or acute facet abnormality is noted. Soft tissues and spinal canal: Surrounding soft tissue structures are within normal limits. Upper chest: Visualized lung apices are within normal limits. Other: None IMPRESSION: CT of the head: Chronic  atrophic and ischemic changes without acute abnormality. CT of the cervical spine: Multilevel degenerative change without acute abnormality. Electronically Signed   By: Inez Catalina M.D.   On: 05/08/2021 08:54   CT CERVICAL SPINE WO CONTRAST  Result Date: 05/08/2021 CLINICAL DATA:  Altered mental status, history of fall yesterday, initial encounter EXAM: CT HEAD WITHOUT CONTRAST CT CERVICAL SPINE WITHOUT CONTRAST TECHNIQUE: Multidetector CT imaging of the head and cervical spine was performed following the standard protocol without intravenous contrast. Multiplanar CT image reconstructions of the cervical spine were also generated. COMPARISON:  01/27/2021 FINDINGS: CT HEAD FINDINGS Brain: No evidence of acute infarction, hemorrhage, hydrocephalus, extra-axial collection or mass lesion/mass effect. Chronic atrophic and ischemic changes are again identified and stable. Vascular: No hyperdense vessel or unexpected calcification. Skull: Normal. Negative for fracture or focal lesion. Sinuses/Orbits: No acute finding. Other: None. CT CERVICAL SPINE FINDINGS Alignment: Normal. Skull base and vertebrae: 7 cervical segments are well visualized. Vertebral body height is well maintained. Disc space narrowing and osteophytic changes are noted. No acute fracture or acute facet abnormality is noted. Soft tissues and spinal canal: Surrounding soft tissue structures are within normal limits. Upper chest: Visualized lung apices are within normal limits. Other: None IMPRESSION: CT of the head: Chronic atrophic and ischemic changes without acute abnormality. CT of the cervical spine: Multilevel degenerative change without acute abnormality. Electronically Signed   By: Inez Catalina M.D.   On: 05/08/2021 08:54   DG Chest Port 1 View  Result Date: 05/08/2021 CLINICAL DATA:  Endotracheal and orogastric tube placement. EXAM: PORTABLE CHEST 1 VIEW COMPARISON:  Radiograph earlier today FINDINGS: Endotracheal tube tip is 19 mm from  the carina. Enteric tube in place with tip below the diaphragm not included in the field of view. Low lung volumes. Cardiomegaly. Increasing patchy bibasilar opacities. Possible small right pleural effusion and fluid in the fissure. Perivascular haziness. Right subclavian stent again seen. No pneumothorax. IMPRESSION: 1. Endotracheal tube tip 19 mm from the carina. Enteric tube in place with tip below the diaphragm not included in the field of view. 2. Low lung volumes with increasing patchy bibasilar opacities. Cardiomegaly with vascular congestion. Possible small right pleural effusion. Electronically Signed   By: Keith Rake M.D.   On: 05/08/2021 19:22   DG Chest Portable 1 View  Result Date: 05/08/2021 CLINICAL DATA:  Altered mental status, weakness, right shoulder pain. EXAM: PORTABLE CHEST 1 VIEW COMPARISON:  01/27/2021 and CT chest 11/23/2016. FINDINGS: Trachea is midline. Heart is enlarged. Central interstitial prominence and indistinctness. Thickening of the minor fissure. Slight blunting of the right costophrenic angle. IMPRESSION: Mild congestive heart failure. Difficult to exclude a viral or atypical pneumonia. Electronically Signed   By: Lorin Picket M.D.   On: 05/08/2021 08:11   DG Shoulder Right Portable  Result Date: 05/08/2021 CLINICAL DATA:  Right shoulder pain Altered mental status Weakness EXAM: PORTABLE RIGHT SHOULDER COMPARISON:  06/21/2019 FINDINGS: No fracture or dislocation. Mild spurring of the acromioclavicular joint. Multiple  right axillary vascular stents are noted. IMPRESSION: Mild right AC joint osteoarthrosis. Electronically Signed   By: Miachel Roux M.D.   On: 05/08/2021 08:13   DG Abd Portable 1V  Result Date: 05/08/2021 CLINICAL DATA:  Orogastric tube placement. EXAM: PORTABLE ABDOMEN - 1 VIEW COMPARISON:  None. FINDINGS: Tip and side port of the enteric tube below the diaphragm in the stomach. Nonobstructed upper abdominal bowel gas pattern. IMPRESSION: Tip and  side port of the enteric tube below the diaphragm in the stomach. Electronically Signed   By: Keith Rake M.D.   On: 05/08/2021 19:23     Medications:   . sodium chloride    . ampicillin-sulbactam (UNASYN) IV Stopped (05/09/21 0047)  . dexmedetomidine (PRECEDEX) IV infusion 0.8 mcg/kg/hr (05/09/21 0731)  . famotidine (PEPCID) IV Stopped (05/08/21 2201)  . fentaNYL infusion INTRAVENOUS 100 mcg/hr (05/09/21 0624)   . allopurinol  200 mg Oral q AM  . arformoterol  15 mcg Nebulization BID  . aspirin EC  81 mg Oral q AM  . atorvastatin  40 mg Per Tube q AM  . budesonide (PULMICORT) nebulizer solution  0.5 mg Nebulization BID  . chlorhexidine gluconate (MEDLINE KIT)  15 mL Mouth Rinse BID  . Chlorhexidine Gluconate Cloth  6 each Topical Q0600  . cholecalciferol  1,000 Units Oral Daily  . docusate  100 mg Per Tube BID  . fentaNYL (SUBLIMAZE) injection  50 mcg Intravenous Once  . furosemide  80 mg Per Tube Daily  . heparin  5,000 Units Subcutaneous Q8H  . insulin aspart  0-6 Units Subcutaneous Q4H  . insulin glargine  10 Units Subcutaneous QHS  . mouth rinse  15 mL Mouth Rinse 10 times per day  . multivitamin with minerals  1 tablet Oral q AM  . pantoprazole (PROTONIX) IV  40 mg Intravenous Q24H  . polyethylene glycol  17 g Per Tube Daily  . revefenacin  175 mcg Nebulization Daily  . sodium chloride flush  3 mL Intravenous Q12H   sodium chloride, acetaminophen, albuterol, cyclobenzaprine, docusate, fentaNYL, hydrALAZINE, midazolam, midazolam, ondansetron (ZOFRAN) IV, polyethylene glycol, sodium chloride flush  Assessment/ Plan:  [name] is a 54 y.o.  male with past medical history of CHF, GERD, diabetes, hypertension and ESRD requiring HD MWF. He presents to ED with altered mental status and s/p fall.   [facility]/ MWF/ Lt AVF/121kg  1. End Stage Renal Disease with hyperkalemia on hemodialysis:  Potassium was 7.2 yesterday.  Down to 5.8 today.  We  will plan for another dialysis session today since potassium is little high.   2. Anemia of chronic kidney disease   Lab Results  Component Value Date   HGB 10.9 (L) 05/09/2021  Hold off on adding back Epogen at the moment.   3. Secondary Hyperparathyroidism:  Phosphate binders:  Calcimimetics:  Vitamin D agent:  Lab Results  Component Value Date   PTH 369 (H) 06/16/2019   CALCIUM 8.5 (L) 05/09/2021   CAION 1.11 (L) 02/24/2020   PHOS 8.5 (H) 05/09/2021  Pulse was noted to be quite high at 8.5 yesterday.  Should come down with ongoing dialysis treatments.   4. Diabetes mellitus type II with chronic kidney disease Home regimen includes Dulaglutide, glimepiride, and Tresiba. Most recent hemoglobin A1c is 5.6 on 01/28/21.  Will monitor glucose levels Primary team will manage SSi  5.  Acute respiratory failure.  Patient intubated.  05/08/2021.  Still on the ventilator.  Weaning as per pulmonary/critical  care.    LOS: 1 Merve Hotard 5/24/20227:34 AM

## 2021-05-09 NOTE — Significant Event (Signed)
Pt was undergoing dialysis and suddenly went into vfib arrest. CPR begun, Code called. See code sheet for details

## 2021-05-09 NOTE — Progress Notes (Signed)
Pt went into cardiac arrest at 2202. Pt into sudden v fib. Chest compressions started. Beth, rn pt's primary rn called code blue at 2202 and relieved this rn of compression at 2203 so this Rn could rinse pt back and deaccess AVF. While performing chest compressions before pt could be rinsed back, primary rn accidentally removed venous line from pt,  this rn held pressure to site, not able to rinse pt back. Pressure held with interruption for shock during code for 52min until bleeding stopped. Arterial line removed and pressure held for 5 min until bleeding stopped. Dressings over site. Dr. Holley Raring notified at 2214 of cardiac arrest, no new verbal orders received from dr. Holley Raring.

## 2021-05-09 NOTE — Significant Event (Addendum)
Code Blue  Notified by RN pt pulseless cardiac rhythm ventricular fibrillation @2202  during hemodialysis, upon my arrival at pts bedside CPR in progress and ACLS protocol initiated (see code sheet for details).  Pt required defibrillation x2 prior to [place] at 2214.  Post code EKG revealed sinus tachycardia with atrial premature complex, hr 123 and bp stable.  Pts daughter [name] notified via telephone regarding decline in pts condition.  Also, notified on call ICU Intensivist Dr. Mortimer Fries per ICU Inensivist not a hypothermic protocol candidate.  Stat labs pending.  Will continue to monitor and assess pt.   Marda Stalker, [place] Pager [phone] (please enter 7 digits) PCCM Consult Pager [phone] (please enter 7 digits)

## 2021-05-09 NOTE — Progress Notes (Signed)
UF goal increased to 3400 to accommodate total of additional 115mL ns flush x3 given for high pressures.

## 2021-05-09 NOTE — Progress Notes (Signed)
BFR turned down to 150 per dr. Holley Raring due to high arterial and venous pressures.

## 2021-05-09 NOTE — Progress Notes (Signed)
Pts daughter [name] and pts son Maxfield Gildersleeve have arrived at bedside, updated both regarding pts current condition and all questions were answered.  Will continue to monitor and assess pt.   Marda Stalker, [place] Pager [phone] (please enter 7 digits) PCCM Consult Pager [phone] (please enter 7 digits)

## 2021-05-09 NOTE — Progress Notes (Signed)
Chaplain Maggie met patient's daughter and son in the ICU waiting room shortly after code blue. Then joined the family at the bedside for prayer, offering non-anxious presence, and support. Patient's children spoke of the challenge of confronting the gravity of their father's health situation. Chaplain made space for encouraging them to have a hard conversation with each other as they are being asked to inform the staff in decision making. Chaplain available as needed to continue to offer spiritual and emotional support.

## 2021-05-09 NOTE — Progress Notes (Signed)
Dr. Holley Raring notified of arterial and venous pressures, and BFR rate, machine alarming frequently , per dr. Holley Raring attempt to hold out for another 30 min if possible on dialysis.

## 2021-05-10 ENCOUNTER — Inpatient Hospital Stay (HOSPITAL_COMMUNITY)
Admit: 2021-05-10 | Discharge: 2021-05-10 | Disposition: A | Discharge status: Home / Self Care | Payer: Medicare Other | Attending: Internal Medicine | Admitting: Internal Medicine

## 2021-05-10 DIAGNOSIS — I248 Other forms of acute ischemic heart disease: Secondary | ICD-10-CM | POA: Diagnosis not present

## 2021-05-10 DIAGNOSIS — E875 Hyperkalemia: Secondary | ICD-10-CM | POA: Diagnosis not present

## 2021-05-10 LAB — BASIC METABOLIC PANEL
Anion gap: 15 (ref 5–15)
BUN: 64 mg/dL — ABNORMAL HIGH (ref 6–20)
CO2: 24 mmol/L (ref 22–32)
Calcium: 8.5 mg/dL — ABNORMAL LOW (ref 8.9–10.3)
Chloride: 99 mmol/L (ref 98–111)
Creatinine, Ser: 10.45 mg/dL — ABNORMAL HIGH (ref 0.61–1.24)
GFR, Estimated: 5 mL/min — ABNORMAL LOW (ref >60)
Glucose, Bld: 131 mg/dL — ABNORMAL HIGH (ref 70–99)
Potassium: 4.3 mmol/L (ref 3.5–5.1)
Sodium: 138 mmol/L (ref 135–145)

## 2021-05-10 LAB — GLUCOSE, CAPILLARY
Glucose-Capillary: 138 mg/dL — ABNORMAL HIGH (ref 70–99)
Glucose-Capillary: 96 mg/dL (ref 70–99)
Glucose-Capillary: 117 mg/dL — ABNORMAL HIGH (ref 70–99)
Glucose-Capillary: 231 mg/dL — ABNORMAL HIGH (ref 70–99)
Glucose-Capillary: 332 mg/dL — ABNORMAL HIGH (ref 70–99)
Glucose-Capillary: 380 mg/dL — ABNORMAL HIGH (ref 70–99)
Glucose-Capillary: 262 mg/dL — ABNORMAL HIGH (ref 70–99)

## 2021-05-10 LAB — CBC WITH DIFFERENTIAL/PLATELET
Abs Immature Granulocytes: 0.04 10*3/uL (ref 0.00–0.07)
Basophils Absolute: 0.1 10*3/uL (ref 0.0–0.1)
Basophils Relative: 1 %
Eosinophils Absolute: 0 10*3/uL (ref 0.0–0.5)
Eosinophils Relative: 0 %
HCT: 31.2 % — ABNORMAL LOW (ref 39.0–52.0)
Hemoglobin: 10.1 g/dL — ABNORMAL LOW (ref 13.0–17.0)
Immature Granulocytes: 0 %
Lymphocytes Relative: 10 %
Lymphs Abs: 1.3 10*3/uL (ref 0.7–4.0)
MCH: 28.9 pg (ref 26.0–34.0)
MCHC: 32.4 g/dL (ref 30.0–36.0)
MCV: 89.1 fL (ref 80.0–100.0)
Monocytes Absolute: 1.1 10*3/uL — ABNORMAL HIGH (ref 0.1–1.0)
Monocytes Relative: 9 %
Neutro Abs: 10.7 10*3/uL — ABNORMAL HIGH (ref 1.7–7.7)
Neutrophils Relative %: 80 %
Platelets: 208 10*3/uL (ref 150–400)
RBC: 3.5 MIL/uL — ABNORMAL LOW (ref 4.22–5.81)
RDW: 14.7 % (ref 11.5–15.5)
WBC: 13.3 10*3/uL — ABNORMAL HIGH (ref 4.0–10.5)
nRBC: 0 % (ref 0.0–0.2)

## 2021-05-10 LAB — TROPONIN I (HIGH SENSITIVITY)
Troponin I (High Sensitivity): 126 ng/L (ref <18)
Troponin I (High Sensitivity): 208 ng/L (ref <18)

## 2021-05-10 LAB — PHOSPHORUS: Phosphorus: 7.7 mg/dL — ABNORMAL HIGH (ref 2.5–4.6)

## 2021-05-10 LAB — MAGNESIUM: Magnesium: 2.5 mg/dL — ABNORMAL HIGH (ref 1.7–2.4)

## 2021-05-10 MED ORDER — METHYLPREDNISOLONE SODIUM SUCC 40 MG IJ SOLR
20.0000 mg | Freq: Two times a day (BID) | INTRAMUSCULAR | Status: DC
  Administered 2021-05-10 – 2021-05-16 (×13): 20 mg via INTRAVENOUS
  Filled 2021-05-10 (×13): qty 1

## 2021-05-10 MED ORDER — SODIUM CHLORIDE 0.9% FLUSH
3.0000 mL | Freq: Two times a day (BID) | INTRAVENOUS | Status: DC
  Administered 2021-05-10 – 2021-05-17 (×14): 3 mL via INTRAVENOUS

## 2021-05-10 MED ORDER — IPRATROPIUM-ALBUTEROL 0.5-2.5 (3) MG/3ML IN SOLN
3.0000 mL | RESPIRATORY_TRACT | Status: DC
  Administered 2021-05-10 – 2021-05-12 (×12): 3 mL via RESPIRATORY_TRACT
  Filled 2021-05-10 (×12): qty 3

## 2021-05-10 MED ORDER — PERFLUTREN LIPID MICROSPHERE
1.0000 mL | INTRAVENOUS | Status: AC | PRN
  Administered 2021-05-10: 2 mL via INTRAVENOUS
  Filled 2021-05-10: qty 10

## 2021-05-10 MED ORDER — TRIAMCINOLONE ACETONIDE 0.1 % EX CREA
TOPICAL_CREAM | Freq: Two times a day (BID) | CUTANEOUS | Status: DC
  Administered 2021-05-13 – 2021-05-14 (×2): 1 via TOPICAL
  Filled 2021-05-10 (×2): qty 15

## 2021-05-10 MED ORDER — CHOLECALCIFEROL 10 MCG/ML (400 UNIT/ML) PO LIQD
1000.0000 [IU] | Freq: Every day | ORAL | Status: DC
  Administered 2021-05-11 – 2021-05-14 (×4): 1000 [IU]
  Filled 2021-05-10 (×4): qty 2.5

## 2021-05-10 NOTE — Progress Notes (Signed)
GOALS OF CARE DISCUSSION  The Clinical status was relayed to family in detail. Daughter Loma Sousa  Updated and notified of patients medical condition.   Explained to family course of therapy and the modalities    Patient with Progressive multiorgan failure with a very high probablity of a very minimal chance of meaningful recovery despite all aggressive and optimal medical therapy.  PATIENT REMAINS FULL CODE  Family understands the situation. Plan for HD as needed Check ECHO   Family are satisfied with Plan of action and management. All questions answered  Additional CC time 35 mins   Suella Cogar Patricia Pesa, M.D.  [facility]  Medical Director [place] Director [facility] Cardio-Pulmonary Department

## 2021-05-10 NOTE — Progress Notes (Signed)
Follow up from previous chaplain after code blue overnight. Patient is intubated and sedated, no family presence. Please contact staff chaplain if and when support is needed.

## 2021-05-10 NOTE — Progress Notes (Signed)
[facility]  ROUNDING NOTE   Subjective:   Patient with significant decompensation yesterday. He was having dialysis late in the evening yesterday. We were notified by nursing. CODE BLUE was called at 2202. Patient suddenly went into ventricular fibrillation. Dialysis treatment was terminated, however instead could not be performed.  Objective:  Vital signs in last 24 hours:  Temp:  [98.78 F (37.1 C)-100.22 F (37.9 C)] 98.78 F (37.1 C) (05/25 0900) Pulse Rate:  [59-80] 77 (05/25 0900) Resp:  [22-23] 22 (05/25 0900) BP: (87-134)/(58-81) 131/69 (05/25 0900) SpO2:  [92 %-100 %] 93 % (05/25 0900) FiO2 (%):  [50 %-100 %] 60 % (05/25 0830) Weight:  [123.6 kg] 123.6 kg (05/25 0436)  Weight change: -0.2 kg Filed Weights   05/09/21 0439 05/10/21 0436  Weight: 123.8 kg 123.6 kg    Intake/Output: I/O last 3 completed shifts: In: 1793.4 [I.V.:1070.7; NG/GT:477; IV Piggyback:245.7] Out: 7782 [UMPNT:6144]   Intake/Output this shift:  No intake/output data recorded.  Physical Exam: General: Critically ill-appearing  Head: Normocephalic, atraumatic. Moist oral mucosal membranes  Eyes: Anicteric, Left periorbital erythema and edema  Lungs:  Scattered rhonchi, vent assisted  Heart: Regular rate and rhythm  Abdomen:  Soft, nontender, BS present  Extremities: 1+ lower extremity edema  Neurologic: Intubated, however following commands  Skin: Rash noted b/l LE's  Access: Rt AVF    Basic Metabolic Panel: Recent Labs  Lab 05/08/21 0859 05/08/21 1940 05/09/21 0453 05/09/21 2233 05/10/21 0537  NA 135 136 137 137 138  K 7.2* 5.9* 5.8* 4.0 4.3  CL 99 96* 99 98 99  CO2 20* 25 23 22 24   GLUCOSE 135* 95 65* 101* 131*  BUN 94* 61* 69* 55* 64*  CREATININE 13.79* 9.26* 10.65* 8.96* 10.45*  CALCIUM 8.7* 8.3* 8.5* 8.5* 8.5*  MG  --   --  1.6* 1.5* 2.5*  PHOS  --   --  8.5* 5.7* 7.7*    Liver Function Tests: Recent Labs  Lab 05/08/21 0859  AST 13*  ALT 16   ALKPHOS 145*  BILITOT 0.9  PROT 8.6*  ALBUMIN 4.3   No results for input(s): LIPASE, AMYLASE in the last 168 hours. Recent Labs  Lab 05/08/21 0859  AMMONIA 25    CBC: Recent Labs  Lab 05/08/21 0859 05/09/21 0453 05/09/21 2233 05/10/21 0537  WBC 13.8* 14.2* 15.0* 13.3*  NEUTROABS 11.3*  --  11.3* 10.7*  HGB 11.6* 10.9* 11.4* 10.1*  HCT 36.9* 33.9* 36.0* 31.2*  MCV 91.6 90.6 90.7 89.1  PLT 226 193 211 208    Cardiac Enzymes: No results for input(s): CKTOTAL, CKMB, CKMBINDEX, TROPONINI in the last 168 hours.  BNP: Invalid input(s): POCBNP  CBG: Recent Labs  Lab 05/09/21 1617 05/09/21 1855 05/09/21 1933 05/10/21 0406 05/10/21 0720  GLUCAP 59* 72 72 138* 96    Microbiology: Results for orders placed or performed during the hospital encounter of 05/08/21  Resp Panel by RT-PCR (Flu A&B, Covid) Nasopharyngeal Swab     Status: None   Collection Time: 05/08/21  8:04 AM   Specimen: Nasopharyngeal Swab; Nasopharyngeal(NP) swabs in vial transport medium  Result Value Ref Range Status   SARS Coronavirus 2 by RT PCR NEGATIVE NEGATIVE Final    Comment: (NOTE) SARS-CoV-2 target nucleic acids are NOT DETECTED.  The SARS-CoV-2 RNA is generally detectable in upper respiratory specimens during the acute phase of infection. The lowest concentration of SARS-CoV-2 viral copies this assay can detect is 138 copies/mL. A negative result does not  preclude SARS-Cov-2 infection and should not be used as the sole basis for treatment or other patient management decisions. A negative result may occur with  improper specimen collection/handling, submission of specimen other than nasopharyngeal swab, presence of viral mutation(s) within the areas targeted by this assay, and inadequate number of viral copies(<138 copies/mL). A negative result must be combined with clinical observations, patient history, and epidemiological information. The expected result is Negative.  Fact Sheet for  Patients:  EntrepreneurPulse.com.au  Fact Sheet for Healthcare Providers:  IncredibleEmployment.be  This test is no t yet approved or cleared by the [place] FDA and  has been authorized for detection and/or diagnosis of SARS-CoV-2 by FDA under an Emergency Use Authorization (EUA). This EUA will remain  in effect (meaning this test can be used) for the duration of the COVID-19 declaration under Section 564(b)(1) of the Act, 21 U.S.C.section 360bbb-3(b)(1), unless the authorization is terminated  or revoked sooner.       Influenza A by PCR NEGATIVE NEGATIVE Final   Influenza B by PCR NEGATIVE NEGATIVE Final    Comment: (NOTE) The Xpert Xpress SARS-CoV-2/FLU/RSV plus assay is intended as an aid in the diagnosis of influenza from Nasopharyngeal swab specimens and should not be used as a sole basis for treatment. Nasal washings and aspirates are unacceptable for Xpert Xpress SARS-CoV-2/FLU/RSV testing.  Fact Sheet for Patients: EntrepreneurPulse.com.au  Fact Sheet for Healthcare Providers: IncredibleEmployment.be  This test is not yet approved or cleared by the [place] FDA and has been authorized for detection and/or diagnosis of SARS-CoV-2 by FDA under an Emergency Use Authorization (EUA). This EUA will remain in effect (meaning this test can be used) for the duration of the COVID-19 declaration under Section 564(b)(1) of the Act, 21 U.S.C. section 360bbb-3(b)(1), unless the authorization is terminated or revoked.  Performed at [facility], [address] [place], [place] [address]   Blood culture (single)     Status: None (Preliminary result)   Collection Time: 05/08/21  8:59 AM   Specimen: BLOOD  Result Value Ref Range Status   Specimen Description BLOOD RIGHT AC  Final   Special Requests   Final    BOTTLES DRAWN AEROBIC AND ANAEROBIC Blood Culture results may not be optimal due  to an excessive volume of blood received in culture bottles   Culture   Final    NO GROWTH 2 DAYS Performed at [facility], [address], [place], [place] [address]    Report Status PENDING  Incomplete  MRSA PCR Screening     Status: None   Collection Time: 05/08/21  6:50 PM   Specimen: Nasopharyngeal  Result Value Ref Range Status   MRSA by PCR NEGATIVE NEGATIVE Final    Comment:        The GeneXpert MRSA Assay (FDA approved for NASAL specimens only), is one component of a comprehensive MRSA colonization surveillance program. It is not intended to diagnose MRSA infection nor to guide or monitor treatment for MRSA infections. Performed at [facility], [address] [place], [place] [address]   Culture, Respiratory w Gram Stain     Status: None (Preliminary result)   Collection Time: 05/09/21 12:16 AM   Specimen: Tracheal Aspirate; Respiratory  Result Value Ref Range Status   Specimen Description   Final    TRACHEAL ASPIRATE Performed at [facility], [address], [place], [place] [address]    Special Requests   Final    NONE Performed at [facility], [place], [place] [address]    Gram Stain   Final    MODERATE WBC PRESENT,BOTH PMN AND MONONUCLEAR RARE GRAM POSITIVE COCCI IN PAIRS RARE GRAM VARIABLE ROD    Culture   Final    CULTURE REINCUBATED FOR BETTER GROWTH Performed at [facility], [place] [address], [place], [place] [address]    Report Status PENDING  Incomplete    Coagulation Studies: No results for input(s): LABPROT, INR in the last 72 hours.  Urinalysis: No results for input(s): COLORURINE, LABSPEC, PHURINE, GLUCOSEU, HGBUR, BILIRUBINUR, KETONESUR, PROTEINUR, UROBILINOGEN, NITRITE, LEUKOCYTESUR in the last 72 hours.  Invalid input(s): APPERANCEUR    Imaging: DG Chest Port 1 View  Result Date: 05/08/2021 CLINICAL DATA:  Endotracheal and orogastric tube placement. EXAM: PORTABLE  CHEST 1 VIEW COMPARISON:  Radiograph earlier today FINDINGS: Endotracheal tube tip is 19 mm from the carina. Enteric tube in place with tip below the diaphragm not included in the field of view. Low lung volumes. Cardiomegaly. Increasing patchy bibasilar opacities. Possible small right pleural effusion and fluid in the fissure. Perivascular haziness. Right subclavian stent again seen. No pneumothorax. IMPRESSION: 1. Endotracheal tube tip 19 mm from the carina. Enteric tube in place with tip below the diaphragm not included in the field of view. 2. Low lung volumes with increasing patchy bibasilar opacities. Cardiomegaly with vascular congestion. Possible small right pleural effusion. Electronically Signed   By: Keith Rake M.D.   On: 05/08/2021 19:22   DG Abd Portable 1V  Result Date: 05/08/2021 CLINICAL DATA:  Orogastric tube placement. EXAM: PORTABLE ABDOMEN - 1 VIEW COMPARISON:  None. FINDINGS: Tip and side port of the enteric tube below the diaphragm in the stomach. Nonobstructed upper abdominal bowel gas pattern. IMPRESSION: Tip and side port of the enteric tube below the diaphragm in the stomach. Electronically Signed   By: Keith Rake M.D.   On: 05/08/2021 19:23     Medications:   . sodium chloride    . sodium chloride    . sodium chloride    . ampicillin-sulbactam (UNASYN) IV Stopped (05/09/21 2341)  . famotidine (PEPCID) IV Stopped (05/08/21 2201)  . feeding supplement (VITAL 1.5 CAL) 1,000 mL (05/09/21 1628)  . fentaNYL infusion INTRAVENOUS 100 mcg/hr (05/10/21 0304)  . midazolam 1 mg/hr (05/10/21 0304)   . allopurinol  200 mg Per Tube q AM  . arformoterol  15 mcg Nebulization BID  . aspirin  81 mg Per Tube Daily  . atorvastatin  40 mg Per Tube q AM  . budesonide (PULMICORT) nebulizer solution  0.5 mg Nebulization BID  . chlorhexidine gluconate (MEDLINE KIT)  15 mL Mouth Rinse BID  . Chlorhexidine Gluconate Cloth  6 each Topical Q0600  . cholecalciferol  1,000 Units Oral  Daily  . docusate  100 mg Per Tube BID  . feeding supplement (PROSource TF)  90 mL Per Tube TID  . fentaNYL (SUBLIMAZE) injection  50 mcg Intravenous Once  . free water  30 mL Per Tube Q4H  . furosemide  80 mg Per Tube Daily  . heparin  5,000 Units Subcutaneous Q8H  . insulin aspart  0-6 Units Subcutaneous Q4H  . insulin glargine  10 Units Subcutaneous QHS  . mouth rinse  15 mL Mouth Rinse 10 times per day  . multivitamin  1 tablet Per Tube QHS  . pantoprazole (PROTONIX) IV  40 mg Intravenous Q24H  . polyethylene glycol  17 g Per Tube Daily  . revefenacin  175 mcg Nebulization Daily  . sodium chloride flush  3 mL Intravenous Q12H   sodium chloride, sodium chloride, sodium chloride, acetaminophen, albuterol, alteplase, cyclobenzaprine, docusate, fentaNYL, heparin, hydrALAZINE, lidocaine (PF), lidocaine-prilocaine, midazolam, ondansetron (ZOFRAN) IV, pentafluoroprop-tetrafluoroeth, polyethylene glycol, sodium chloride flush  Assessment/ Plan:  [name] is a 55 y.o.  male with past medical history of CHF, GERD, diabetes, hypertension and ESRD requiring HD MWF. He presents to ED with altered mental status and s/p fall.   [facility]/ MWF/ Lt AVF/121kg  1. End Stage Renal Disease with hyperkalemia on hemodialysis:  Initial potassium upon presentation was 7.2.  Potassium was down to 5.8 yesterday.  Posttreatment potassium was 4.0.  Given V. fib arrest yesterday hold off on further dialysis treatment today.  Reassess for dialysis treatment tomorrow.   2. Anemia of chronic kidney disease   Lab Results  Component Value Date   HGB 10.1 (L) 05/10/2021  Continue to hold Epogen for now.   3. Secondary Hyperparathyroidism:  Phosphate binders:  Calcimimetics:  Vitamin D agent:  Lab Results  Component Value Date   PTH 369 (H) 06/16/2019   CALCIUM 8.5 (L) 05/10/2021   CAION 1.11 (L) 02/24/2020   PHOS 7.7 (H) 05/10/2021  Phosphorus down a bit to 7.7 but still  high.  Continue to monitor.   4. Diabetes mellitus type II with chronic kidney disease Home regimen includes Dulaglutide, glimepiride, and Tresiba. Most recent hemoglobin A1c is 5.6 on 01/28/21.  Will monitor glucose levels Primary team will manage SSi  5.  Acute respiratory failure.  Patient intubated.  05/08/2021.  Remains on the ventilator at this time.  6.  Ventricular fibrillation arrest during dialysis.  Unclear as to what precipitated this.  His potassium was actually found to be normal posttreatment.  Serum magnesium post treatment.     LOS: 2 Montserrat Shek 5/25/202210:14 AM

## 2021-05-10 NOTE — Progress Notes (Signed)
NAME:  [name], MRN:  [id number], DOB:  Feb 13, 1966, LOS: 2 ADMISSION DATE:  05/08/2021  BRIEF SYNOPSIS acute and severe mental status changes and severe hypoxia  History of Present Illness:   55 y.o.malewith a history ofdCHF,ESRD on hemodialysis COPD,diabetes morbid obesity who presents to ER with severealtered mental status. Had a fall hitting the left side of his forehead yesterday.   Went to dialysis this morning where he was noted to be somnolent, poorly interactive, not able to follow commands. +headache.    Pt unable to complete dialysis today, per EMS pt had fall yesterday, at dialysis patient kept falling asleep, unable to determine baseline mentation due to being a new dialysis site. Pt with bruising and swelling present to left eye. Pt also complains of back pain and a headache.  Patient subsequently developed acute SOB and hypoxia failed biPAP and emergently intubated   Micro Data:  COVID NEG INF A/B NEG  Antibiotics Given (last 72 hours)    Date/Time Action Medication Dose Rate   05/09/21 0017 New Bag/Given   Ampicillin-Sulbactam (UNASYN) 3 g in sodium chloride 0.9 % 100 mL IVPB 3 g 200 mL/hr   05/09/21 2310 New Bag/Given   Ampicillin-Sulbactam (UNASYN) 3 g in sodium chloride 0.9 % 100 mL IVPB 3 g 200 mL/hr         Significant Hospital Events: Including procedures, antibiotic start and stop dates in addition to other pertinent events   5/23 admitted to ICU for severe hypoxia and SOB, k=7.2, 5/23 intubated severe encephalopathy  5/24 intubated, severe encephalopathy, severe hypoxia fio2 80%  5/25 severe hypoxia cardiac arrest last night 12 minutes CPR      Interim History / Subjective:  Remains critically ill Severe hypoxia S/p cardiac arrest multiorgan failure       Objective   Blood pressure 116/80, pulse 77, temperature 98.96 F (37.2 C), resp. rate (!) 23, height 5\' 3"  (1.6 m), weight 123.6 kg, SpO2 95  %.    Vent Mode: PRVC FiO2 (%):  [60 %-100 %] 60 % Set Rate:  [22 bmp] 22 bmp Vt Set:  [450 mL] 450 mL PEEP:  [10 cmH20] 10 cmH20   Intake/Output Summary (Last 24 hours) at 05/10/2021 0738 Last data filed at 05/10/2021 0304 Gross per 24 hour  Intake 1270.5 ml  Output 1349 ml  Net -78.5 ml   Filed Weights   05/09/21 0439 05/10/21 0436  Weight: 123.8 kg 123.6 kg      REVIEW OF SYSTEMS  PATIENT IS UNABLE TO PROVIDE COMPLETE REVIEW OF SYSTEMS DUE TO SEVERE CRITICAL ILLNESS AND TOXIC METABOLIC ENCEPHALOPATHY   PHYSICAL EXAMINATION:  GENERAL:critically ill appearing, +resp distress HEAD: Normocephalic, atraumatic.  EYES: Pupils equal, round, reactive to light.  No scleral icterus.  MOUTH: Moist mucosal membrane. NECK: Supple. PULMONARY: +rhonchi, +wheezing CARDIOVASCULAR: S1 and S2. Regular rate and rhythm. No murmurs, rubs, or gallops.  GASTROINTESTINAL: Soft, nontender, -distended. Positive bowel sounds.  MUSCULOSKELETAL: No swelling, clubbing, or edema.  NEUROLOGIC: obtunded SKIN:intact,warm,dry   Labs/imaging that I havepersonally reviewed  (right click and "Reselect all SmartList Selections" daily)     ASSESSMENT AND PLAN SYNOPSIS    55 yo morbidly obese AAM with severe acute hypoxic respiratory failure with severe toxic metabolic encephalopathy with acute diastolic heart failure  progressive cardio-renal syndrome, started therapy for aspiration pneumonia s/p cardiac arrest    Severe ACUTE Hypoxic and Hypercapnic Respiratory Failure -continue Mechanical Ventilator support -continue Bronchodilator Therapy -Wean Fio2 and PEEP as tolerated -VAP/VENT bundle  implementation Severe hypoxia  Vent Mode: PRVC FiO2 (%):  [60 %-100 %] 60 % Set Rate:  [22 bmp] 22 bmp Vt Set:  [450 mL] 450 mL PEEP:  [10 cmH20] 10 cmH20    CARDIAC FAILURE-acute diastolic dysfunction Acute cardiac arrest Check ECHO -oxygen as needed -Lasix as tolerated -follow up cardiac  enzymes as indicated   CARDIAC ICU monitoring   CHRONICKIDNEY INJURY/Renal Failure ESRD on HD -continue Foley Catheter-assess need -Avoid nephrotoxic agents -Follow urine output, BMP -Ensure adequate renal perfusion, optimize oxygenation -Renal dose medications Will defer to Nephrology on next HD session   NEUROLOGY Acute toxic metabolic encephalopathy, need for sedation Goal RASS -2 to -3   INFECTIOUS DISEASE Threapy for Aspiration pneumonia -continue antibiotics as prescribed -follow up cultures -follow up ID consultation  ENDO - ICU hypoglycemic\Hyperglycemia protocol -check FSBS per protocol   GI GI PROPHYLAXIS as indicated  NUTRITIONAL STATUS DIET-->TF's as tolerated Constipation protocol as indicated   ELECTROLYTES -follow labs as needed -replace as needed -pharmacy consultation and following     Best practice (right click and "Reselect all SmartList Selections" daily)  Diet:NPO Pain/Anxiety/Delirium protocol (if indicated):Yes(RASS goal -2) VAP protocol (if indicated):Yes DVT prophylaxis:Subcutaneous Heparin GI prophylaxis:H2B Glucose control:SSIYes Central venous access:N/A Arterial line:N/A Foley:N/A Mobility:bed rest PT consulted:N/A Code Status:full code Disposition:ICU     Labs   CBC: Recent Labs  Lab 05/08/21 0859 05/09/21 0453 05/09/21 2233 05/10/21 0537  WBC 13.8* 14.2* 15.0* 13.3*  NEUTROABS 11.3*  --  11.3* 10.7*  HGB 11.6* 10.9* 11.4* 10.1*  HCT 36.9* 33.9* 36.0* 31.2*  MCV 91.6 90.6 90.7 89.1  PLT 226 193 211 606    Basic Metabolic Panel: Recent Labs  Lab 05/08/21 0859 05/08/21 1940 05/09/21 0453 05/09/21 2233 05/10/21 0537  NA 135 136 137 137 138  K 7.2* 5.9* 5.8* 4.0 4.3  CL 99 96* 99 98 99  CO2 20* 25 23 22 24   GLUCOSE 135* 95 65* 101* 131*  BUN 94* 61* 69* 55* 64*  CREATININE 13.79* 9.26* 10.65* 8.96* 10.45*  CALCIUM 8.7* 8.3* 8.5* 8.5* 8.5*  MG  --   --  1.6* 1.5* 2.5*   PHOS  --   --  8.5* 5.7* 7.7*   GFR: Estimated Creatinine Clearance: 9.8 mL/min (A) (by C-G formula based on SCr of 10.45 mg/dL (H)). Recent Labs  Lab 05/08/21 0859 05/09/21 0453 05/09/21 2233 05/10/21 0537  WBC 13.8* 14.2* 15.0* 13.3*    Liver Function Tests: Recent Labs  Lab 05/08/21 0859  AST 13*  ALT 16  ALKPHOS 145*  BILITOT 0.9  PROT 8.6*  ALBUMIN 4.3   No results for input(s): LIPASE, AMYLASE in the last 168 hours. Recent Labs  Lab 05/08/21 0859  AMMONIA 25    ABG    Component Value Date/Time   PHART 7.31 (L) 05/09/2021 0434   PCO2ART 50 (H) 05/09/2021 0434   PO2ART 124 (H) 05/09/2021 0434   HCO3 25.2 05/09/2021 0434   TCO2 30 02/24/2020 0635   ACIDBASEDEF 1.4 05/09/2021 0434   O2SAT 98.5 05/09/2021 0434     Coagulation Profile: No results for input(s): INR, PROTIME in the last 168 hours.  Cardiac Enzymes: No results for input(s): CKTOTAL, CKMB, CKMBINDEX, TROPONINI in the last 168 hours.  HbA1C: Hgb A1c MFr Bld  Date/Time Value Ref Range Status  05/09/2021 04:53 AM 7.1 (H) 4.8 - 5.6 % Final    Comment:    (NOTE)         Prediabetes: 5.7 - 6.4  Diabetes: >6.4         Glycemic control for adults with diabetes: <7.0   01/28/2021 01:24 AM 5.6 4.8 - 5.6 % Final    Comment:    (NOTE) Pre diabetes:          5.7%-6.4%  Diabetes:              >6.4%  Glycemic control for   <7.0% adults with diabetes     CBG: Recent Labs  Lab 05/09/21 1617 05/09/21 1855 05/09/21 1933 05/10/21 0406 05/10/21 0720  GLUCAP 59* 72 72 138* 96    Allergies Allergies  Allergen Reactions  . Shellfish Allergy Anaphylaxis    Face and throat swelling, difficulty breathing Allergy can be triggered by touching (contact)  . Other   . Betadine [Povidone Iodine] Rash  . Povidone-Iodine Rash       DVT/GI PRX  assessed I Assessed the need for Labs I Assessed the need for Foley I Assessed the need for Central Venous Line Family Discussion when  available I Assessed the need for Mobilization I made an Assessment of medications to be adjusted accordingly Safety Risk assessment completed  CASE DISCUSSED IN MULTIDISCIPLINARY ROUNDS WITH ICU TEAM     Critical Care Time devoted to patient care services described in this note is 65 minutes.  Critical care was necessary to treat or prevent imminent or life-threatening deterioration. Overall, patient is critically ill, prognosis is guarded.  Patient with Multiorgan failure and at high risk for cardiac arrest and death.    Corrin Parker, M.D.  [facility]  Medical Director [place] Director [facility] Cardio-Pulmonary Department

## 2021-05-10 NOTE — Consult Note (Signed)
[facility] Consultation Note  Patient ID: [name], MRN: [id number], DOB/AGE: [birth year] 53 y.o. Admit date: 05/08/2021   Date of Consult: 05/10/2021 Primary Physician: [facility], [place] [facility] Primary Cardiologist: [facility]  Chief Complaint:  Chief Complaint  Patient presents with  . Altered Mental Status   Reason for Consult: Acute V. fib arrest  HPI: 55 y.o. male with known diabetes hypertension hyperlipidemia and chronic systolic dysfunction congestive heart failure due to previous non-ST elevation myocardial infarction and inferior LV systolic dysfunction with ejection fraction of 35% status post PCI and stent placement of right coronary artery who has been doing fairly well with dialysis and appropriate medication management for further risk reduction.  The patient had a sudden episode of V. fib arrest with dialysis with no evidence of significant anginal symptoms or other concerns.  This V. fib arrest was sudden and was treated with ACLS protocol.  The patient also was intubated and has not been on respirations at this time with no other concerns at this time.  The patient has not been able to be weaned at this point due to some oxygenation nation issues.  Currently the patient has had a troponin of 208 and a hemoglobin of 10.1.  Chest x-ray shows some vascular congestion and a small right pleural effusion with some cardiomegaly.  EKG shows sinus tachycardia with left atrial enlargement and cannot rule out septal infarct age undetermined.  Currently the patient is reasonably hemodynamically stable  Past Medical History:  Diagnosis Date  . Bell's palsy   . Bell's palsy   . Cataract   . CHF (congestive heart failure) ([place])   . Chronic kidney disease    dialysis  . COPD (chronic obstructive pulmonary disease) ([place])   . Coronary artery disease   . Diabetes mellitus without complication ([place])   . Dyspnea    Wheezing  . GERD (gastroesophageal reflux disease)   . Gout    . HOH (hard of hearing)    mild  . Hypercholesterolemia   . Hypertension   . NSVT (nonsustained ventricular tachycardia) ([place])   . Obstructive sleep apnea    CPAP, O2 use continuosly at 3LPM  . Orthopnea   . Oxygen dependent    3 lpm continuous  . Pneumonia    In Past X2 most recent 1 yr ago  . Psoriasis   . Psoriasis   . Pulmonary HTN ([place])   . Renal failure       Surgical History:  Past Surgical History:  Procedure Laterality Date  . A/V FISTULAGRAM Right 05/02/2021   Procedure: A/V FISTULAGRAM;  Surgeon: Katha Cabal, MD;  Location: [facility];  Service: Cardiovascular;  Laterality: Right;  . AV FISTULA PLACEMENT Right 11/20/2019   Procedure: ARTERIOVENOUS (AV) FISTULA CREATION ( BRACHIAL CEPHALIC );  Surgeon: Katha Cabal, MD;  Location: [facility];  Service: Vascular;  Laterality: Right;  . CATARACT EXTRACTION W/PHACO Left 12/17/2019   Procedure: CATARACT EXTRACTION PHACO AND INTRAOCULAR LENS PLACEMENT ([place]) LEFT ISTENT INJ DIABETIC;  Surgeon: Eulogio Bear, MD;  Location: [facility];  Service: Ophthalmology;  Laterality: Left;  [place] 00:33.2 CDE 2.96 Fluid Pack Lot # [id number]  . CATARACT EXTRACTION W/PHACO Right 02/24/2020   Procedure: CATARACT EXTRACTION PHACO AND INTRAOCULAR LENS PLACEMENT (I[facility]) RIGHT DIABETIC ISTENT INJ;  Surgeon: Eulogio Bear, MD;  Location: [facility];  Service: Ophthalmology;  Laterality: Right;  [place] 00:37.3 CDE 2.88 Fluid Pack Lot # [id number]  . CORONARY ANGIOPLASTY WITH STENT PLACEMENT  stent placement  . DIALYSIS/PERMA CATHETER INSERTION N/A 06/22/2019   Procedure: DIALYSIS/PERMA CATHETER INSERTION;  Surgeon: Algernon Huxley, MD;  Location: [name];  Service: Cardiovascular;  Laterality: N/A;  . DIALYSIS/PERMA CATHETER REMOVAL N/A 05/31/2020   Procedure: DIALYSIS/PERMA CATHETER REMOVAL;  Surgeon: Katha Cabal, MD;  Location: [facility];  Service: Cardiovascular;  Laterality: N/A;  . INSERTION OF  AHMED VALVE Left 12/17/2019   Procedure: INSERTION OF iSTENT;  Surgeon: Eulogio Bear, MD;  Location: [facility];  Service: Ophthalmology;  Laterality: Left;     Home Meds: Prior to Admission medications   Medication Sig Start Date End Date Taking? Authorizing Provider  albuterol (PROVENTIL HFA;VENTOLIN HFA) 108 (90 Base) MCG/ACT inhaler Inhale 2 puffs into the lungs every 4 (four) hours as needed for wheezing or shortness of breath. 01/20/19   Doles-Johnson, Teah, NP  allopurinol (ZYLOPRIM) 100 MG tablet Take 200 mg by mouth in the morning. 09/23/20 09/23/21  [provider]  ANORO ELLIPTA 62.5-25 MCG/INH AEPB INHALE 1 PUFF INTO THE LUNGS DAILY. 11/30/19   Kendell Bane, NP  aspirin EC 81 MG tablet Take 81 mg by mouth in the morning. Swallow whole.    [provider]  atorvastatin (LIPITOR) 40 MG tablet Take 1 tablet (40 mg total) by mouth at bedtime. Patient taking differently: Take 40 mg by mouth in the morning. 03/21/20   Kendell Bane, NP  betamethasone dipropionate 0.05 % cream Apply topically as directed. Apply every AM for 2 weeks then decrease to every other day Patient not taking: No sig reported 10/04/20   Ralene Bathe, MD  calcipotriene (DOVONOX) 0.005 % cream Apply topically as directed. Apply every PM for two weeks then every other day alternating with the betamethasone Patient not taking: No sig reported 10/04/20   Ralene Bathe, MD  Calcipotriene-Betameth Diprop (ENSTILAR) 0.005-0.064 % FOAM Apply to skin qd-bid Patient not taking: No sig reported 09/22/20   Ralene Bathe, MD  calcium acetate (PHOSLO) 667 MG capsule Take 667 mg by mouth 3 (three) times daily with meals. 01/17/21   [provider]  carvedilol (COREG) 25 MG tablet Take 25 mg by mouth 2 (two) times daily. 04/11/21   [provider]  Cholecalciferol (D3-1000) 25 MCG (1000 UT) tablet Take 2 tablets (2,000 Units total) by mouth daily. Patient taking differently: Take  1,000 Units by mouth in the morning and at bedtime. 01/20/19   Doles-Johnson, Teah, NP  cyclobenzaprine (FLEXERIL) 5 MG tablet Take 1 tablet (5 mg total) by mouth 2 (two) times daily as needed for muscle spasms. 01/31/21   Nicole Kindred A, DO  diclofenac Sodium (VOLTAREN) 1 % GEL Apply 4 g topically 4 (four) times daily as needed. For Knee Pain Patient not taking: No sig reported 01/31/21   Nicole Kindred A, DO  Dulaglutide (TRULICITY) 1.5 PR/9.4VO SOPN Inject 1.5 sq every week Patient taking differently: Inject 1.5 mg into the skin every Monday. 08/30/20   Lavera Guise, MD  furosemide (LASIX) 80 MG tablet Take 80 mg by mouth daily. 04/11/21   [provider]  gabapentin (NEURONTIN) 300 MG capsule Take 300 mg by mouth in the morning. 01/17/21   [provider]  glimepiride (AMARYL) 4 MG tablet Take 4 mg by mouth in the morning. 11/27/19   [provider]  hydrALAZINE (APRESOLINE) 100 MG tablet Take 100 mg by mouth daily. Patient not taking: No sig reported 04/11/21   [provider]  insulin degludec (TRESIBA FLEXTOUCH) 100 UNIT/ML FlexTouch Pen Inject 20 Units into the skin at bedtime. 08/30/20   Lavera Guise, MD  isosorbide mononitrate (IMDUR) 60 MG 24 hr tablet Take 60 mg by mouth daily. 04/11/21   [provider]  magnesium oxide (MAG-OX) 400 MG tablet Take 400 mg by mouth 2 (two) times daily.    [provider]  Multiple Vitamins-Minerals (TAB-A-VITE) TABS TAKE 1 TABLET BY MOUTH ONCE DAILY. Patient taking differently: Take 1 tablet by mouth in the morning. 02/19/20   Kendell Bane, NP  omeprazole (PRILOSEC) 20 MG capsule Take 1 capsule (20 mg total) by mouth 2 (two) times daily before a meal. Patient taking differently: Take 20 mg by mouth in the morning. 02/19/20   Kendell Bane, NP  Risankizumab-rzaa (SKYRIZI) 150 MG/ML SOSY Inject 150 mg into the skin as directed. Every 12 weeks for maintenance. Patient taking differently: Inject 150 mg  into the skin every 4 (four) months. 11/09/20   Ralene Bathe, MD  triamcinolone cream (KENALOG) 0.1 % Apply 1 application topically 2 (two) times daily. Patient not taking: No sig reported    [provider]  TRULICITY 3 EP/3.2RJ SOPN Inject 1.5 mg into the skin once a week. 04/11/21   [provider]    Inpatient Medications:  . allopurinol  200 mg Per Tube q AM  . aspirin  81 mg Per Tube Daily  . atorvastatin  40 mg Per Tube q AM  . budesonide (PULMICORT) nebulizer solution  0.5 mg Nebulization BID  . chlorhexidine gluconate (MEDLINE KIT)  15 mL Mouth Rinse BID  . Chlorhexidine Gluconate Cloth  6 each Topical Q0600  . [START ON 05/11/2021] cholecalciferol  1,000 Units Per Tube Daily  . docusate  100 mg Per Tube BID  . feeding supplement (PROSource TF)  90 mL Per Tube TID  . fentaNYL (SUBLIMAZE) injection  50 mcg Intravenous Once  . free water  30 mL Per Tube Q4H  . furosemide  80 mg Per Tube Daily  . heparin  5,000 Units Subcutaneous Q8H  . insulin aspart  0-6 Units Subcutaneous Q4H  . insulin glargine  10 Units Subcutaneous QHS  . ipratropium-albuterol  3 mL Nebulization Q4H  . mouth rinse  15 mL Mouth Rinse 10 times per day  . methylPREDNISolone (SOLU-MEDROL) injection  20 mg Intravenous BID  . multivitamin  1 tablet Per Tube QHS  . pantoprazole (PROTONIX) IV  40 mg Intravenous Q24H  . polyethylene glycol  17 g Per Tube Daily  . sodium chloride flush  3 mL Intravenous Q12H  . triamcinolone cream   Topical BID   . sodium chloride    . sodium chloride    . sodium chloride    . ampicillin-sulbactam (UNASYN) IV Stopped (05/09/21 2341)  . famotidine (PEPCID) IV Stopped (05/08/21 2201)  . feeding supplement (VITAL 1.5 CAL) 1,000 mL (05/09/21 1628)  . fentaNYL infusion INTRAVENOUS 125 mcg/hr (05/10/21 1138)  . midazolam 1 mg/hr (05/10/21 0304)    Allergies:  Allergies  Allergen Reactions  . Shellfish Allergy Anaphylaxis    Face and throat swelling,  difficulty breathing Allergy can be triggered by touching (contact)  . Other   . Betadine [Povidone Iodine] Rash  . Povidone-Iodine Rash    Social History   Socioeconomic History  . Marital status: Single    Spouse name: Not on file  . Number of children: Not on file  . Years of education: Not on file  .  Highest education level: Not on file  Occupational History    Comment: disabled  Tobacco Use  . Smoking status: Never Smoker  . Smokeless tobacco: Never Used  Vaping Use  . Vaping Use: Never used  Substance and Sexual Activity  . Alcohol use: Not Currently    Alcohol/week: 1.0 standard drink    Types: 1 Cans of beer per week    Comment: occasional  . Drug use: Not Currently    Types: Marijuana    Comment: in high school   . Sexual activity: Not on file  Other Topics Concern  . Not on file  Social History Narrative   Patient uses continuous oxygen at 3L/min   Dialysis, AV fistula in L arm   Lives at home by himself ; friend gives pt. Transportation    Social Determinants of Health   Financial Resource Strain: Not on file  Food Insecurity: Not on file  Transportation Needs: Not on file  Physical Activity: Not on file  Stress: Not on file  Social Connections: Not on file  Intimate Partner Violence: Not on file     Family History  Problem Relation Age of Onset  . Heart failure Mother   . Kidney failure Brother      Review of Systems   Labs: No results for input(s): CKTOTAL, CKMB, TROPONINI in the last 72 hours. Lab Results  Component Value Date   WBC 13.3 (H) 05/10/2021   HGB 10.1 (L) 05/10/2021   HCT 31.2 (L) 05/10/2021   MCV 89.1 05/10/2021   PLT 208 05/10/2021    Recent Labs  Lab 05/08/21 0859 05/08/21 1940 05/10/21 0537  NA 135   < > 138  K 7.2*   < > 4.3  CL 99   < > 99  CO2 20*   < > 24  BUN 94*   < > 64*  CREATININE 13.79*   < > 10.45*  CALCIUM 8.7*   < > 8.5*  PROT 8.6*  --   --   BILITOT 0.9  --   --   ALKPHOS 145*  --   --   ALT  16  --   --   AST 13*  --   --   GLUCOSE 135*   < > 131*   < > = values in this interval not displayed.   Lab Results  Component Value Date   CHOL 100 05/09/2021   HDL 24 (L) 05/09/2021   LDLCALC 53 05/09/2021   TRIG 117 05/09/2021   No results found for: DDIMER  Radiology/Studies:  CT Head Wo Contrast  Result Date: 05/08/2021 CLINICAL DATA:  Altered mental status, history of fall yesterday, initial encounter EXAM: CT HEAD WITHOUT CONTRAST CT CERVICAL SPINE WITHOUT CONTRAST TECHNIQUE: Multidetector CT imaging of the head and cervical spine was performed following the standard protocol without intravenous contrast. Multiplanar CT image reconstructions of the cervical spine were also generated. COMPARISON:  01/27/2021 FINDINGS: CT HEAD FINDINGS Brain: No evidence of acute infarction, hemorrhage, hydrocephalus, extra-axial collection or mass lesion/mass effect. Chronic atrophic and ischemic changes are again identified and stable. Vascular: No hyperdense vessel or unexpected calcification. Skull: Normal. Negative for fracture or focal lesion. Sinuses/Orbits: No acute finding. Other: None. CT CERVICAL SPINE FINDINGS Alignment: Normal. Skull base and vertebrae: 7 cervical segments are well visualized. Vertebral body height is well maintained. Disc space narrowing and osteophytic changes are noted. No acute fracture or acute facet abnormality is noted. Soft tissues and spinal canal: Surrounding soft  tissue structures are within normal limits. Upper chest: Visualized lung apices are within normal limits. Other: None IMPRESSION: CT of the head: Chronic atrophic and ischemic changes without acute abnormality. CT of the cervical spine: Multilevel degenerative change without acute abnormality. Electronically Signed   By: Inez Catalina M.D.   On: 05/08/2021 08:54   CT CERVICAL SPINE WO CONTRAST  Result Date: 05/08/2021 CLINICAL DATA:  Altered mental status, history of fall yesterday, initial encounter EXAM:  CT HEAD WITHOUT CONTRAST CT CERVICAL SPINE WITHOUT CONTRAST TECHNIQUE: Multidetector CT imaging of the head and cervical spine was performed following the standard protocol without intravenous contrast. Multiplanar CT image reconstructions of the cervical spine were also generated. COMPARISON:  01/27/2021 FINDINGS: CT HEAD FINDINGS Brain: No evidence of acute infarction, hemorrhage, hydrocephalus, extra-axial collection or mass lesion/mass effect. Chronic atrophic and ischemic changes are again identified and stable. Vascular: No hyperdense vessel or unexpected calcification. Skull: Normal. Negative for fracture or focal lesion. Sinuses/Orbits: No acute finding. Other: None. CT CERVICAL SPINE FINDINGS Alignment: Normal. Skull base and vertebrae: 7 cervical segments are well visualized. Vertebral body height is well maintained. Disc space narrowing and osteophytic changes are noted. No acute fracture or acute facet abnormality is noted. Soft tissues and spinal canal: Surrounding soft tissue structures are within normal limits. Upper chest: Visualized lung apices are within normal limits. Other: None IMPRESSION: CT of the head: Chronic atrophic and ischemic changes without acute abnormality. CT of the cervical spine: Multilevel degenerative change without acute abnormality. Electronically Signed   By: Inez Catalina M.D.   On: 05/08/2021 08:54   PERIPHERAL VASCULAR CATHETERIZATION  Result Date: 05/02/2021 See Op Note  DG Chest Port 1 View  Result Date: 05/08/2021 CLINICAL DATA:  Endotracheal and orogastric tube placement. EXAM: PORTABLE CHEST 1 VIEW COMPARISON:  Radiograph earlier today FINDINGS: Endotracheal tube tip is 19 mm from the carina. Enteric tube in place with tip below the diaphragm not included in the field of view. Low lung volumes. Cardiomegaly. Increasing patchy bibasilar opacities. Possible small right pleural effusion and fluid in the fissure. Perivascular haziness. Right subclavian stent again  seen. No pneumothorax. IMPRESSION: 1. Endotracheal tube tip 19 mm from the carina. Enteric tube in place with tip below the diaphragm not included in the field of view. 2. Low lung volumes with increasing patchy bibasilar opacities. Cardiomegaly with vascular congestion. Possible small right pleural effusion. Electronically Signed   By: Keith Rake M.D.   On: 05/08/2021 19:22   DG Chest Portable 1 View  Result Date: 05/08/2021 CLINICAL DATA:  Altered mental status, weakness, right shoulder pain. EXAM: PORTABLE CHEST 1 VIEW COMPARISON:  01/27/2021 and CT chest 11/23/2016. FINDINGS: Trachea is midline. Heart is enlarged. Central interstitial prominence and indistinctness. Thickening of the minor fissure. Slight blunting of the right costophrenic angle. IMPRESSION: Mild congestive heart failure. Difficult to exclude a viral or atypical pneumonia. Electronically Signed   By: Lorin Picket M.D.   On: 05/08/2021 08:11   DG Shoulder Right Portable  Result Date: 05/08/2021 CLINICAL DATA:  Right shoulder pain Altered mental status Weakness EXAM: PORTABLE RIGHT SHOULDER COMPARISON:  06/21/2019 FINDINGS: No fracture or dislocation. Mild spurring of the acromioclavicular joint. Multiple right axillary vascular stents are noted. IMPRESSION: Mild right AC joint osteoarthrosis. Electronically Signed   By: Miachel Roux M.D.   On: 05/08/2021 08:13   DG Abd Portable 1V  Result Date: 05/08/2021 CLINICAL DATA:  Orogastric tube placement. EXAM: PORTABLE ABDOMEN - 1 VIEW COMPARISON:  None. FINDINGS: Tip  and side port of the enteric tube below the diaphragm in the stomach. Nonobstructed upper abdominal bowel gas pattern. IMPRESSION: Tip and side port of the enteric tube below the diaphragm in the stomach. Electronically Signed   By: Keith Rake M.D.   On: 05/08/2021 19:23       Weights: Filed Weights   05/09/21 0439 05/10/21 0436  Weight: 123.8 kg 123.6 kg     Physical Exam: Blood pressure 109/67,  pulse 77, temperature 98.42 F (36.9 C), resp. rate 18, height _0  (1.6 m), weight 123.6 kg, SpO2 92 %. Body mass index is 48.27 kg/m. General: Well developed, well nourished,  Head eyes ears nose throat: Normocephalic, atraumatic, sclera non-icteric, no xanthomas, nares are without discharge. No apparent thyromegaly and/or mass  Lungs: Intubated.  no wheezes, no rales, no rhonchi.  Heart: RRR with normal S1 S2. no murmur gallop, no rub, PMI is normal size and placement, carotid upstroke normal without bruit, jugular venous pressure is normal Abdomen: Soft, non-tender, non-distended with normoactive bowel sounds. No hepatomegaly. No rebound/guarding. No obvious abdominal masses. Abdominal aorta is normal size without bruit Extremities: 1+ edema. no cyanosis, no clubbing, no ulcers  Peripheral : 2+ bilateral upper extremity pulses, 2+ bilateral femoral pulses, 2+ bilateral dorsal pedal pulse Neuro: Not alert and oriented. No facial asymmetry. No focal deficit. Moves all extremities spontaneously. Musculoskeletal: Normal muscle tone without kyphosis Psych: Does not responds to questions appropriately with a normal affect.    Assessment: 55 year old male with hypertension hyperlipidemia coronary artery disease status post previous non-ST elevation myocardial infarction and inferior hypokinesis with ejection fraction of 35% and PCI and stent placement of right coronary artery in the remote past having acute V. fib arrest of unknown etiology likely cardiac in nature currently intubated but hemodynamically stable  Plan: 1.  Continue supportive care for hypoxia and acute V. fib arrest with needing oxygenation and continued intubation 2.  Continue treatment of dialysis as necessary 3.  Pressure support as necessary 4.  Echocardiogram for LV systolic dysfunction valvular heart disease contributing to above 5.  Cardiac catheterization to assess coronary anatomy and further treatment thereof is  necessary due to V. fib arrest and further consideration of defibrillator placement based on above  Signed, Corey Skains M.D. [facility] 05/10/2021, 12:42 PM

## 2021-05-11 ENCOUNTER — Inpatient Hospital Stay: Payer: Medicare Other

## 2021-05-11 ENCOUNTER — Encounter
Admission: EM | Disposition: A | Discharge status: Home Health | Payer: Self-pay | Source: Home / Self Care | Attending: Pulmonary Disease

## 2021-05-11 DIAGNOSIS — E875 Hyperkalemia: Secondary | ICD-10-CM | POA: Diagnosis not present

## 2021-05-11 LAB — BASIC METABOLIC PANEL
Anion gap: 15 (ref 5–15)
BUN: 95 mg/dL — ABNORMAL HIGH (ref 6–20)
CO2: 23 mmol/L (ref 22–32)
Calcium: 8.6 mg/dL — ABNORMAL LOW (ref 8.9–10.3)
Chloride: 100 mmol/L (ref 98–111)
Creatinine, Ser: 11.42 mg/dL — ABNORMAL HIGH (ref 0.61–1.24)
GFR, Estimated: 5 mL/min — ABNORMAL LOW (ref >60)
Glucose, Bld: 236 mg/dL — ABNORMAL HIGH (ref 70–99)
Potassium: 5.8 mmol/L — ABNORMAL HIGH (ref 3.5–5.1)
Sodium: 138 mmol/L (ref 135–145)

## 2021-05-11 LAB — GLUCOSE, CAPILLARY
Glucose-Capillary: 195 mg/dL — ABNORMAL HIGH (ref 70–99)
Glucose-Capillary: 198 mg/dL — ABNORMAL HIGH (ref 70–99)
Glucose-Capillary: 197 mg/dL — ABNORMAL HIGH (ref 70–99)
Glucose-Capillary: 252 mg/dL — ABNORMAL HIGH (ref 70–99)
Glucose-Capillary: 185 mg/dL — ABNORMAL HIGH (ref 70–99)
Glucose-Capillary: 215 mg/dL — ABNORMAL HIGH (ref 70–99)

## 2021-05-11 LAB — CULTURE, RESPIRATORY W GRAM STAIN: Culture: NORMAL

## 2021-05-11 LAB — CBC WITH DIFFERENTIAL/PLATELET
Abs Immature Granulocytes: 0.06 10*3/uL (ref 0.00–0.07)
Basophils Absolute: 0 10*3/uL (ref 0.0–0.1)
Basophils Relative: 0 %
Eosinophils Absolute: 0 10*3/uL (ref 0.0–0.5)
Eosinophils Relative: 0 %
HCT: 29.7 % — ABNORMAL LOW (ref 39.0–52.0)
Hemoglobin: 9.4 g/dL — ABNORMAL LOW (ref 13.0–17.0)
Immature Granulocytes: 1 %
Lymphocytes Relative: 4 %
Lymphs Abs: 0.4 10*3/uL — ABNORMAL LOW (ref 0.7–4.0)
MCH: 28.2 pg (ref 26.0–34.0)
MCHC: 31.6 g/dL (ref 30.0–36.0)
MCV: 89.2 fL (ref 80.0–100.0)
Monocytes Absolute: 0.3 10*3/uL (ref 0.1–1.0)
Monocytes Relative: 3 %
Neutro Abs: 9.9 10*3/uL — ABNORMAL HIGH (ref 1.7–7.7)
Neutrophils Relative %: 92 %
Platelets: 198 10*3/uL (ref 150–400)
RBC: 3.33 MIL/uL — ABNORMAL LOW (ref 4.22–5.81)
RDW: 14.5 % (ref 11.5–15.5)
WBC: 10.7 10*3/uL — ABNORMAL HIGH (ref 4.0–10.5)
nRBC: 0.2 % (ref 0.0–0.2)

## 2021-05-11 LAB — ECHOCARDIOGRAM COMPLETE
Area-P 1/2: 2.95 cm2
Height: 63 in
S' Lateral: 4.37 cm
Weight: 4359.82 oz

## 2021-05-11 LAB — MAGNESIUM: Magnesium: 2.5 mg/dL — ABNORMAL HIGH (ref 1.7–2.4)

## 2021-05-11 LAB — PHOSPHORUS: Phosphorus: 8.2 mg/dL — ABNORMAL HIGH (ref 2.5–4.6)

## 2021-05-11 SURGERY — LEFT HEART CATH AND CORONARY ANGIOGRAPHY
Anesthesia: Moderate Sedation

## 2021-05-11 MED ORDER — MIDAZOLAM BOLUS VIA INFUSION
2.0000 mg | Freq: Once | INTRAVENOUS | Status: AC
  Administered 2021-05-11: 2 mg via INTRAVENOUS
  Filled 2021-05-11: qty 2

## 2021-05-11 MED ORDER — FENTANYL BOLUS VIA INFUSION
100.0000 ug | Freq: Once | INTRAVENOUS | Status: AC
  Administered 2021-05-11: 100 ug via INTRAVENOUS
  Filled 2021-05-11: qty 100

## 2021-05-11 MED ORDER — FENTANYL BOLUS VIA INFUSION
50.0000 ug | Freq: Once | INTRAVENOUS | Status: AC
  Administered 2021-05-11: 50 ug via INTRAVENOUS
  Filled 2021-05-11: qty 50

## 2021-05-11 MED ORDER — SODIUM CHLORIDE 0.9 % WEIGHT BASED INFUSION: 1.0000 mL/kg/h | INTRAVENOUS | Status: DC

## 2021-05-11 MED ORDER — SODIUM CHLORIDE 0.9 % IV SOLN: 250.0000 mL | INTRAVENOUS | Status: DC | PRN

## 2021-05-11 MED ORDER — SODIUM CHLORIDE 0.9 % WEIGHT BASED INFUSION: 3.0000 mL/kg/h | INTRAVENOUS | Status: AC

## 2021-05-11 MED ORDER — ASPIRIN 81 MG PO CHEW: 81.0000 mg | CHEWABLE_TABLET | ORAL | Status: AC

## 2021-05-11 MED ORDER — HEPARIN SODIUM (PORCINE) 1000 UNIT/ML DIALYSIS
1000.0000 [IU] | INTRAMUSCULAR | Status: DC | PRN
  Administered 2021-05-11: 3000 [IU] via INTRAVENOUS_CENTRAL
  Administered 2021-05-13 – 2021-05-17 (×2): 2800 [IU] via INTRAVENOUS_CENTRAL
  Filled 2021-05-11: qty 3
  Filled 2021-05-11: qty 6

## 2021-05-11 MED ORDER — SODIUM CHLORIDE 0.9% FLUSH
3.0000 mL | INTRAVENOUS | Status: DC | PRN
  Administered 2021-05-15: 3 mL via INTRAVENOUS

## 2021-05-11 MED ORDER — MIDAZOLAM BOLUS VIA INFUSION
4.0000 mg | Freq: Once | INTRAVENOUS | Status: AC
  Administered 2021-05-11: 4 mg via INTRAVENOUS
  Filled 2021-05-11: qty 4

## 2021-05-11 NOTE — Progress Notes (Signed)
Pt opens eyes to voice and follows commands. VSS. SB in 50's this evening. Pt has not voided, bladder scan shows 414cc in bladder. MD made aware and in and out cath performed per verbal order. Versed and fentanyl boluses administered for sedation with Trialysis placement. Daughter at bedside for dialysis at this time. Will continue to monitor.

## 2021-05-11 NOTE — Progress Notes (Signed)
[facility]  ROUNDING NOTE   Subjective:   Patient with significant decompensation on 05-20-21 V-fib Coded during dialysis Arousable to name today Intubated Fentanyl Gtt Cardiac cath postponed  Foley  Objective:  Vital signs in last 24 hours:  Temp:  [96.08 F (35.6 C)-98.78 F (37.1 C)] 98.06 F (36.7 C) (05/26 0600) Pulse Rate:  [58-80] 58 (05/26 0600) Resp:  [18-25] 20 (05/26 0300) BP: (108-141)/(64-97) 125/73 (05/26 0600) SpO2:  [90 %-98 %] 96 % (05/26 0600) FiO2 (%):  [60 %] 60 % (05/26 0516) Weight:  [122 kg] 122 kg (05/26 0448)  Weight change: -1.6 kg Filed Weights   May 20, 2021 0439 05/10/21 0436 05/11/21 0448  Weight: 123.8 kg 123.6 kg 122 kg    Intake/Output: I/O last 3 completed shifts: In: 1868.2 [I.V.:1140.4; NG/GT:477; IV Piggyback:250.7] Out: 1899 [Urine:550; WIOMB:5597]   Intake/Output this shift:  No intake/output data recorded.  Physical Exam: General: Critically ill-appearing  Head: Normocephalic, atraumatic. Moist oral mucosal membranes  Eyes: Anicteric, Left periorbital erythema and edema  Lungs:  Scattered rhonchi, vent assisted  Heart: Regular rate and rhythm  Abdomen:  Soft, nontender, BS present  Extremities: 1+ lower extremity edema  Neurologic: Intubated, however following commands, aroused to name  Skin: Rash noted b/l LE's  Access: Rt AVF    Basic Metabolic Panel: Recent Labs  Lab 05/08/21 1940 May 20, 2021 0453 05-20-2021 2233 05/10/21 0537 05/11/21 0534  NA 136 137 137 138 138  K 5.9* 5.8* 4.0 4.3 5.8*  CL 96* 99 98 99 100  CO2 _0 GLUCOSE 95 65* 101* 131* 236*  BUN 61* 69* 55* 64* 95*  CREATININE 9.26* 10.65* 8.96* 10.45* 11.42*  CALCIUM 8.3* 8.5* 8.5* 8.5* 8.6*  MG  --  1.6* 1.5* 2.5* 2.5*  PHOS  --  8.5* 5.7* 7.7* 8.2*    Liver Function Tests: Recent Labs  Lab 05/08/21 0859  AST 13*  ALT 16  ALKPHOS 145*  BILITOT 0.9  PROT 8.6*  ALBUMIN 4.3   No results for input(s): LIPASE, AMYLASE  in the last 168 hours. Recent Labs  Lab 05/08/21 0859  AMMONIA 25    CBC: Recent Labs  Lab 05/08/21 0859 05/20/2021 0453 05-20-21 2233 05/10/21 0537 05/11/21 0534  WBC 13.8* 14.2* 15.0* 13.3* 10.7*  NEUTROABS 11.3*  --  11.3* 10.7* 9.9*  HGB 11.6* 10.9* 11.4* 10.1* 9.4*  HCT 36.9* 33.9* 36.0* 31.2* 29.7*  MCV 91.6 90.6 90.7 89.1 89.2  PLT 226 193 211 208 198    Cardiac Enzymes: No results for input(s): CKTOTAL, CKMB, CKMBINDEX, TROPONINI in the last 168 hours.  BNP: Invalid input(s): POCBNP  CBG: Recent Labs  Lab 05/10/21 1528 05/10/21 1911 05/10/21 2317 05/11/21 0355 05/11/21 0715  GLUCAP 332* 380* 262* 195* 198*    Microbiology: Results for orders placed or performed during the hospital encounter of 05/08/21  Resp Panel by RT-PCR (Flu A&B, Covid) Nasopharyngeal Swab     Status: None   Collection Time: 05/08/21  8:04 AM   Specimen: Nasopharyngeal Swab; Nasopharyngeal(NP) swabs in vial transport medium  Result Value Ref Range Status   SARS Coronavirus 2 by RT PCR NEGATIVE NEGATIVE Final    Comment: (NOTE) SARS-CoV-2 target nucleic acids are NOT DETECTED.  The SARS-CoV-2 RNA is generally detectable in upper respiratory specimens during the acute phase of infection. The lowest concentration of SARS-CoV-2 viral copies this assay can detect is 138 copies/mL. A negative result does not preclude SARS-Cov-2 infection and should not be used  as the sole basis for treatment or other patient management decisions. A negative result may occur with  improper specimen collection/handling, submission of specimen other than nasopharyngeal swab, presence of viral mutation(s) within the areas targeted by this assay, and inadequate number of viral copies(<138 copies/mL). A negative result must be combined with clinical observations, patient history, and epidemiological information. The expected result is Negative.  Fact Sheet for Patients:   EntrepreneurPulse.com.au  Fact Sheet for Healthcare Providers:  IncredibleEmployment.be  This test is no t yet approved or cleared by the [place] FDA and  has been authorized for detection and/or diagnosis of SARS-CoV-2 by FDA under an Emergency Use Authorization (EUA). This EUA will remain  in effect (meaning this test can be used) for the duration of the COVID-19 declaration under Section 564(b)(1) of the Act, 21 U.S.C.section 360bbb-3(b)(1), unless the authorization is terminated  or revoked sooner.       Influenza A by PCR NEGATIVE NEGATIVE Final   Influenza B by PCR NEGATIVE NEGATIVE Final    Comment: (NOTE) The Xpert Xpress SARS-CoV-2/FLU/RSV plus assay is intended as an aid in the diagnosis of influenza from Nasopharyngeal swab specimens and should not be used as a sole basis for treatment. Nasal washings and aspirates are unacceptable for Xpert Xpress SARS-CoV-2/FLU/RSV testing.  Fact Sheet for Patients: EntrepreneurPulse.com.au  Fact Sheet for Healthcare Providers: IncredibleEmployment.be  This test is not yet approved or cleared by the [place] FDA and has been authorized for detection and/or diagnosis of SARS-CoV-2 by FDA under an Emergency Use Authorization (EUA). This EUA will remain in effect (meaning this test can be used) for the duration of the COVID-19 declaration under Section 564(b)(1) of the Act, 21 U.S.C. section 360bbb-3(b)(1), unless the authorization is terminated or revoked.  Performed at [facility], [address] [place], [place] [address]   Blood culture (single)     Status: None (Preliminary result)   Collection Time: 05/08/21  8:59 AM   Specimen: BLOOD  Result Value Ref Range Status   Specimen Description BLOOD RIGHT AC  Final   Special Requests   Final    BOTTLES DRAWN AEROBIC AND ANAEROBIC Blood Culture results may not be optimal due to an  excessive volume of blood received in culture bottles   Culture   Final    NO GROWTH 2 DAYS Performed at [facility], [address], [place], [place] [address]    Report Status PENDING  Incomplete  MRSA PCR Screening     Status: None   Collection Time: 05/08/21  6:50 PM   Specimen: Nasopharyngeal  Result Value Ref Range Status   MRSA by PCR NEGATIVE NEGATIVE Final    Comment:        The GeneXpert MRSA Assay (FDA approved for NASAL specimens only), is one component of a comprehensive MRSA colonization surveillance program. It is not intended to diagnose MRSA infection nor to guide or monitor treatment for MRSA infections. Performed at [facility], [address] [place], [place] [address]   Culture, Respiratory w Gram Stain     Status: None (Preliminary result)   Collection Time: 05/09/21 12:16 AM   Specimen: Tracheal Aspirate; Respiratory  Result Value Ref Range Status   Specimen Description   Final    TRACHEAL ASPIRATE Performed at [facility], [address] [place], [place] [address]    Special Requests   Final    NONE Performed at [facility], [place], [place]  [address]    Gram Stain   Final    MODERATE WBC PRESENT,BOTH PMN AND MONONUCLEAR RARE GRAM POSITIVE COCCI IN PAIRS RARE GRAM VARIABLE ROD    Culture   Final    CULTURE REINCUBATED FOR BETTER GROWTH Performed at [facility], [place] [address], [place], [place] [address]    Report Status PENDING  Incomplete    Coagulation Studies: No results for input(s): LABPROT, INR in the last 72 hours.  Urinalysis: No results for input(s): COLORURINE, LABSPEC, PHURINE, GLUCOSEU, HGBUR, BILIRUBINUR, KETONESUR, PROTEINUR, UROBILINOGEN, NITRITE, LEUKOCYTESUR in the last 72 hours.  Invalid input(s): APPERANCEUR    Imaging: No results found.   Medications:   . sodium chloride    . sodium chloride    . sodium chloride    . sodium chloride    .  [START ON 05/12/2021] sodium chloride     Followed by  . [START ON 05/12/2021] sodium chloride    . ampicillin-sulbactam (UNASYN) IV Stopped (05/10/21 2358)  . famotidine (PEPCID) IV Stopped (05/10/21 2227)  . feeding supplement (VITAL 1.5 CAL) 45 mL/hr at 05/11/21 0100  . fentaNYL infusion INTRAVENOUS 150 mcg/hr (05/11/21 0846)  . midazolam 4 mg/hr (05/11/21 0600)   . allopurinol  200 mg Per Tube q AM  . aspirin  81 mg Per Tube Daily  . [START ON 05/12/2021] aspirin  81 mg Oral Pre-Cath  . atorvastatin  40 mg Per Tube q AM  . budesonide (PULMICORT) nebulizer solution  0.5 mg Nebulization BID  . chlorhexidine gluconate (MEDLINE KIT)  15 mL Mouth Rinse BID  . Chlorhexidine Gluconate Cloth  6 each Topical Q0600  . cholecalciferol  1,000 Units Per Tube Daily  . docusate  100 mg Per Tube BID  . feeding supplement (PROSource TF)  90 mL Per Tube TID  . fentaNYL (SUBLIMAZE) injection  50 mcg Intravenous Once  . free water  30 mL Per Tube Q4H  . furosemide  80 mg Per Tube Daily  . heparin  5,000 Units Subcutaneous Q8H  . insulin aspart  0-6 Units Subcutaneous Q4H  . insulin glargine  10 Units Subcutaneous QHS  . ipratropium-albuterol  3 mL Nebulization Q4H  . mouth rinse  15 mL Mouth Rinse 10 times per day  . methylPREDNISolone (SOLU-MEDROL) injection  20 mg Intravenous BID  . multivitamin  1 tablet Per Tube QHS  . pantoprazole (PROTONIX) IV  40 mg Intravenous Q24H  . polyethylene glycol  17 g Per Tube Daily  . sodium chloride flush  3 mL Intravenous Q12H  . sodium chloride flush  3 mL Intravenous Q12H  . triamcinolone cream   Topical BID   sodium chloride, sodium chloride, sodium chloride, sodium chloride, acetaminophen, albuterol, alteplase, cyclobenzaprine, docusate, fentaNYL, heparin, hydrALAZINE, lidocaine (PF), lidocaine-prilocaine, midazolam, ondansetron (ZOFRAN) IV, pentafluoroprop-tetrafluoroeth, polyethylene glycol, sodium chloride flush, sodium chloride flush  Assessment/ Plan:   [name] is a 55 y.o.  male with past medical history of CHF, GERD, diabetes, hypertension and ESRD requiring HD MWF. He presents to ED with altered mental status and s/p fall.   [facility]/ MWF/ Lt AVF/121kg  1. End Stage Renal Disease with hyperkalemia on hemodialysis:  Initial potassium upon presentation was 7.2.  Potassium remains at 5.8.  Will perform dialysis today with low BFR. UF reduced to 2L as tolerated.  ICU team placed line for HD   2. Anemia of chronic kidney disease   Lab Results  Component Value Date   HGB  9.4 (L) 05/11/2021  Will monitor for need for EPO tomorrow   3. Secondary Hyperparathyroidism:  Phosphate binders:  Calcimimetics:  Vitamin D agent:  Lab Results  Component Value Date   PTH 369 (H) 06/16/2019   CALCIUM 8.6 (L) 05/11/2021   CAION 1.11 (L) 02/24/2020   PHOS 8.2 (H) 05/11/2021  Phosphorus rebounded to 8.2.  Will monitor.   4. Diabetes mellitus type II with chronic kidney disease Home regimen includes Dulaglutide, glimepiride, and Tresiba. Most recent hemoglobin A1c is 5.6 on 01/28/21.  Primary team will manage glucose and  SSi  5.  Acute respiratory failure.  Patient intubated.  05/08/2021.  Ventilated at this time.  6.  Ventricular fibrillation arrest during dialysis.  Unclear as to what precipitated this.  Electrolytes within acceptable levels. Cardiology consulted and recommended Cardiac cath. Awaiting scheduling   LOS: 3   5/26/20229:09 AM

## 2021-05-11 NOTE — Progress Notes (Signed)
Inpatient Diabetes Program Recommendations  AACE/ADA: New Consensus Statement on Inpatient Glycemic Control   Target Ranges:  Prepandial:   less than 140 mg/dL      Peak postprandial:   less than 180 mg/dL (1-2 hours)      Critically ill patients:  140 - 180 mg/dL   Results for ARDON, FRANKLIN (MRN [id number]) as of 05/11/2021 09:19  Ref. Range 05/10/2021 04:06 05/10/2021 07:20 05/10/2021 10:44 05/10/2021 12:39 05/10/2021 15:28 05/10/2021 19:11 05/10/2021 23:17 05/11/2021 03:55 05/11/2021 07:15  Glucose-Capillary Latest Ref Range: 70 - 99 mg/dL 138 (H) 96 117 (H) 231 (H) 332 (H) 380 (H) 262 (H) 195 (H) 198 (H)   Review of Glycemic Control  Current orders for Inpatient glycemic control: Lantus 10 units QHS, Novolog 0-6 units Q4H; Solumedrol 20 mg BID, Vital @ 45 ml/hr  Inpatient Diabetes Program Recommendations:    Insulin:  Please consider ordering Novolog 3 units Q4H for tube feeding coverage. If tube feeding is stopped or held then Novolog tube feeding coverage should also be stopped or held.  Thanks, Barnie Alderman, RN, MSN, CDE Diabetes Coordinator Inpatient Diabetes Program [phone] (Team Pager from 8am to 5pm)

## 2021-05-11 NOTE — Progress Notes (Signed)
GOALS OF CARE DISCUSSION  The Clinical status was relayed to family in detail. Daughter Loma Sousa At Bedside Updated and notified of patients medical condition.    Patient remains unresponsive and will not open eyes to command.   Patient is having a weak cough and struggling to remove secretions.   Patient with increased WOB and using accessory muscles to breathe Explained to family course of therapy and the modalities    Patient with Progressive multiorgan failure with a very high probablity of a very minimal chance of meaningful recovery despite all aggressive and optimal medical therapy.  PATIENT REMAINS FULL CODE  Family understands the situation. Plan for HD carefully-high risk for cardiac arrest    Family are satisfied with Plan of action and management. All questions answered  Additional CC time 30 mins   Lalita Ebel Patricia Pesa, M.D.  [facility]  Medical Director [place] Director [facility] Cardio-Pulmonary Department

## 2021-05-11 NOTE — Procedures (Signed)
Central Venous Catheter Insertion Procedure Note  [name]  [id number]  09/05/1966  Date:05/11/21  Time:11:47 AM   Provider Performing:Keeven Matty D Dewaine Conger   Procedure: Insertion of Non-tunneled Central Venous Catheter(36556)with US guidance (42595)    Indication(s) Hemodialysis  Consent Risks of the procedure as well as the alternatives and risks of each were explained to the patient and/or caregiver.  Consent for the procedure was obtained and is signed in the bedside chart  Anesthesia Topical only with 1% lidocaine   Timeout Verified patient identification, verified procedure, site/side was marked, verified correct patient position, special equipment/implants available, medications/allergies/relevant history reviewed, required imaging and test results available.  Sterile Technique Maximal sterile technique including full sterile barrier drape, hand hygiene, sterile gown, sterile gloves, mask, hair covering, sterile ultrasound probe cover (if used).  Procedure Description Area of catheter insertion was cleaned with chlorhexidine and draped in sterile fashion.   With real-time ultrasound guidance a HD catheter was placed into the right internal jugular vein.  Nonpulsatile blood flow and easy flushing noted in all ports.  The catheter was sutured in place and sterile dressing applied.  Complications/Tolerance None; patient tolerated the procedure well. Chest X-ray is ordered to verify placement for internal jugular or subclavian cannulation.  Chest x-ray is not ordered for femoral cannulation.  EBL Minimal  Specimen(s) None   Line secured at the 16 cm mark.  BIOPATCH applied to the insertion site.    [name], AGACNP-BC [facility] Prefer epic messenger for cross cover needs If after hours, please call E-link

## 2021-05-11 NOTE — Progress Notes (Signed)
NAME:  [name], MRN:  [id number], DOB:  September 19, 1966, LOS: 3 ADMISSION DATE:  05/08/2021  55 y.o.malewith a history ofdCHF,ESRD on hemodialysis COPD,diabetes morbid obesity who presents to ER with severealtered mental status. Had a fall hitting the left side of his forehead yesterday.   Went to dialysis this morning where he was noted to be somnolent, poorly interactive, not able to follow commands. +headache.    Pt unable to complete dialysis today, per EMS pt had fall yesterday, at dialysis patient kept falling asleep, unable to determine baseline mentation due to being a new dialysis site. Pt with bruising and swelling present to left eye. Pt also complains of back pain and a headache.  Patient subsequently developed acute SOB and hypoxia failed biPAP and emergently intubated   Micro Data:  COVID NEG INF A/B NEG 5/24 SPUTUM CULTURES  RARE GRAM POSITIVE COCCI IN PAIRS  RARE GRAM VARIABLE ROD    Antibiotics Given (last 72 hours)    Date/Time Action Medication Dose Rate   05/09/21 0017 New Bag/Given   Ampicillin-Sulbactam (UNASYN) 3 g in sodium chloride 0.9 % 100 mL IVPB 3 g 200 mL/hr   05/09/21 2310 New Bag/Given   Ampicillin-Sulbactam (UNASYN) 3 g in sodium chloride 0.9 % 100 mL IVPB 3 g 200 mL/hr   05/10/21 2328 New Bag/Given   Ampicillin-Sulbactam (UNASYN) 3 g in sodium chloride 0.9 % 100 mL IVPB 3 g 200 mL/hr        Significant Hospital Events: Including procedures, antibiotic start and stop dates in addition to other pertinent events   5/23 admitted to ICU for severe hypoxia and SOB, k=7.2, 5/23 intubated severe encephalopathy  5/24 intubated, severe encephalopathy, severe hypoxia fio2 80%  5/25 severe hypoxia cardiac arrest last night 12 minutes CPR  5/26 severe multiorgan failure remains on vent         Interim History / Subjective:  Remains critically ill multiorgan failure S/p cardiac arrest        Objective    Blood pressure 125/73, pulse (!) 58, temperature 98.06 F (36.7 C), temperature source Rectal, resp. rate 20, height 5\' 3"  (1.6 m), weight 122 kg, SpO2 96 %.    Vent Mode: PRVC FiO2 (%):  [60 %] 60 % Set Rate:  [22 bmp] 22 bmp Vt Set:  [450 mL] 450 mL PEEP:  [10 cmH20] 10 cmH20   Intake/Output Summary (Last 24 hours) at 05/11/2021 0758 Last data filed at 05/11/2021 0600 Gross per 24 hour  Intake 597.67 ml  Output 550 ml  Net 47.67 ml   Filed Weights   05/09/21 0439 05/10/21 0436 05/11/21 0448  Weight: 123.8 kg 123.6 kg 122 kg      REVIEW OF SYSTEMS  PATIENT IS UNABLE TO PROVIDE COMPLETE REVIEW OF SYSTEMS DUE TO SEVERE CRITICAL ILLNESS AND TOXIC METABOLIC ENCEPHALOPATHY   PHYSICAL EXAMINATION:  GENERAL:critically ill appearing, +resp distress HEAD: Normocephalic, atraumatic.  EYES: Pupils equal, round, reactive to light.  No scleral icterus.  MOUTH: Moist mucosal membrane. NECK: Supple. PULMONARY: +rhonchi, +wheezing CARDIOVASCULAR: S1 and S2. Regular rate and rhythm. No murmurs, rubs, or gallops.  GASTROINTESTINAL: Soft, nontender, -distended. Positive bowel sounds.  MUSCULOSKELETAL: No swelling, clubbing, or edema.  NEUROLOGIC: obtunded SKIN:intact,warm,dry   Labs/imaging that I havepersonally reviewed  (right click and "Reselect all SmartList Selections" daily)       ASSESSMENT AND PLAN SYNOPSIS  55 yo morbidly obese AAM with severe acute hypoxic respiratory failure with severe toxic metabolic encephalopathy with acute diastolic heart  failure  progressive cardio-renal syndrome, started therapy for aspiration pneumonia s/p cardiac arrest    Severe ACUTE Hypoxic and Hypercapnic Respiratory Failure -continue Mechanical Ventilator support -continue Bronchodilator Therapy -Wean Fio2 and PEEP as tolerated -VAP/VENT bundle implementation Vent Mode: PRVC FiO2 (%):  [60 %] 60 % Set Rate:  [22 bmp] 22 bmp Vt Set:  [450 mL] 450 mL PEEP:  [10 cmH20] 10  cmH20   SEVERE COPD EXACERBATION -continue IV steroids as prescribed -continue NEB THERAPY as prescribed   CARDIAC FAILURE-cardiac arrest +ischemic cardiomyopathy ECHO pending Cardiology consulted May consider CATH  CARDIAC ICU monitoring   ESRD ON HD -continue Foley Catheter-assess need -Avoid nephrotoxic agents -Follow urine output, BMP -Ensure adequate renal perfusion, optimize oxygenation -Renal dose medications HD as needed tolerated  NEUROLOGY Acute toxic metabolic encephalopathy, need for sedation Goal RASS -2 to -3   INFECTIOUS DISEASE Therapy for aspiration pneumonia RARE GRAM POSITIVE COCCI IN PAIRS  RARE GRAM VARIABLE ROD  -continue antibiotics as prescribed -follow up cultures   ENDO - ICU hypoglycemic\Hyperglycemia protocol -check FSBS per protocol   GI GI PROPHYLAXIS as indicated  NUTRITIONAL STATUS DIET-->TF's as tolerated Constipation protocol as indicated   ELECTROLYTES -follow labs as needed -replace as needed -pharmacy consultation and following     Best practice (right click and "Reselect all SmartList Selections" daily)  Diet:NPO Pain/Anxiety/Delirium protocol (if indicated):Yes(RASS goal -2) VAP protocol (if indicated):Yes DVT prophylaxis:Subcutaneous Heparin GI prophylaxis:H2B Glucose control:SSIYes Central venous access:N/A Arterial line:N/A Foley:N/A Mobility:bed rest PT consulted:N/A Code Status:full code Disposition:ICU      Labs   CBC: Recent Labs  Lab 05/08/21 0859 05/09/21 0453 05/09/21 2233 05/10/21 0537 05/11/21 0534  WBC 13.8* 14.2* 15.0* 13.3* 10.7*  NEUTROABS 11.3*  --  11.3* 10.7* 9.9*  HGB 11.6* 10.9* 11.4* 10.1* 9.4*  HCT 36.9* 33.9* 36.0* 31.2* 29.7*  MCV 91.6 90.6 90.7 89.1 89.2  PLT 226 193 211 208 419    Basic Metabolic Panel: Recent Labs  Lab 05/08/21 1940 05/09/21 0453 05/09/21 2233 05/10/21 0537 05/11/21 0534  NA 136 137 137 138 138  K 5.9* 5.8*  4.0 4.3 5.8*  CL 96* 99 98 99 100  CO2 25 23 22 24 23   GLUCOSE 95 65* 101* 131* 236*  BUN 61* 69* 55* 64* 95*  CREATININE 9.26* 10.65* 8.96* 10.45* 11.42*  CALCIUM 8.3* 8.5* 8.5* 8.5* 8.6*  MG  --  1.6* 1.5* 2.5* 2.5*  PHOS  --  8.5* 5.7* 7.7* 8.2*   GFR: Estimated Creatinine Clearance: 8.9 mL/min (A) (by C-G formula based on SCr of 11.42 mg/dL (H)). Recent Labs  Lab 05/09/21 0453 05/09/21 2233 05/10/21 0537 05/11/21 0534  WBC 14.2* 15.0* 13.3* 10.7*    Liver Function Tests: Recent Labs  Lab 05/08/21 0859  AST 13*  ALT 16  ALKPHOS 145*  BILITOT 0.9  PROT 8.6*  ALBUMIN 4.3   No results for input(s): LIPASE, AMYLASE in the last 168 hours. Recent Labs  Lab 05/08/21 0859  AMMONIA 25    ABG    Component Value Date/Time   PHART 7.31 (L) 05/09/2021 0434   PCO2ART 50 (H) 05/09/2021 0434   PO2ART 124 (H) 05/09/2021 0434   HCO3 25.2 05/09/2021 0434   TCO2 30 02/24/2020 0635   ACIDBASEDEF 1.4 05/09/2021 0434   O2SAT 98.5 05/09/2021 0434     Coagulation Profile: No results for input(s): INR, PROTIME in the last 168 hours.  Cardiac Enzymes: No results for input(s): CKTOTAL, CKMB, CKMBINDEX, TROPONINI in the last 168  hours.  HbA1C: Hgb A1c MFr Bld  Date/Time Value Ref Range Status  05/09/2021 04:53 AM 7.1 (H) 4.8 - 5.6 % Final    Comment:    (NOTE)         Prediabetes: 5.7 - 6.4         Diabetes: >6.4         Glycemic control for adults with diabetes: <7.0   01/28/2021 01:24 AM 5.6 4.8 - 5.6 % Final    Comment:    (NOTE) Pre diabetes:          5.7%-6.4%  Diabetes:              >6.4%  Glycemic control for   <7.0% adults with diabetes     CBG: Recent Labs  Lab 05/10/21 1528 05/10/21 1911 05/10/21 2317 05/11/21 0355 05/11/21 0715  GLUCAP 332* 380* 262* 195* 198*    Allergies Allergies  Allergen Reactions  . Shellfish Allergy Anaphylaxis    Face and throat swelling, difficulty breathing Allergy can be triggered by touching (contact)  .  Other   . Betadine [Povidone Iodine] Rash  . Povidone-Iodine Rash       DVT/GI PRX  assessed I Assessed the need for Labs I Assessed the need for Foley I Assessed the need for Central Venous Line Family Discussion when available I Assessed the need for Mobilization I made an Assessment of medications to be adjusted accordingly Safety Risk assessment completed  CASE DISCUSSED IN MULTIDISCIPLINARY ROUNDS WITH ICU TEAM     Critical Care Time devoted to patient care services described in this note is 60  minutes.  Critical care was necessary to treat or prevent imminent or life-threatening deterioration. Overall, patient is critically ill, prognosis is guarded.  Patient with Multiorgan failure and at high risk for cardiac arrest and death.    Corrin Parker, M.D.  [facility]  Medical Director [place] Director [facility] Cardio-Pulmonary Department

## 2021-05-11 NOTE — Progress Notes (Signed)
[facility] Encounter Note  Patient: [name] / Admit Date: 05/08/2021 / Date of Encounter: 05/11/2021, 1:10 PM   Subjective: Patient not significantly changed from yesterday.  Patient had apparently some falls and other difficulty with prior to dialysis yesterday.  Some hypoxia and ended up with V. fib arrest.  Patient was intubated due to hypoxia and since has been oxygenating well after intubation.  There has been no evidence of further rhythm disturbances since intubated and currently hemodynamically stable.  EKG previously has shown sinus tachycardia and telemetry shows sinus rhythm at this time.  Troponin 126 and 208 consistent with demand ischemia rather than acute coronary syndrome patient does have known systolic dysfunction congestive heart failure for which may have been contributing to his current issues.  Review of Systems: Cannot assess  Objective: Telemetry: Normal sinus rhythm Physical Exam: Blood pressure 125/73, pulse (!) 58, temperature 98.06 F (36.7 C), temperature source Rectal, resp. rate 20, height 5' 3" (1.6 m), weight 122 kg, SpO2 94 %. Body mass index is 47.64 kg/m. General: Well developed, well nourished, in no acute distress. Head: Normocephalic, atraumatic, sclera non-icteric, no xanthomas, nares are without discharge. Neck: No apparent masses Lungs: Intubated with no wheezes, no rhonchi, no rales , no crackles   Heart: Regular rate and rhythm, normal S1 S2, no murmur, no rub, no gallop, PMI is normal size and placement, carotid upstroke normal without bruit, jugular venous pressure normal Abdomen: Soft, non-tender, non-distended with normoactive bowel sounds. No hepatosplenomegaly. Abdominal aorta is normal size without bruit Extremities: 1+ edema, no clubbing, no cyanosis, no ulcers,  Peripheral: 2+ radial, 2+ femoral, 2+ dorsal pedal pulses Neuro: Not alert and oriented. Moves all extremities spontaneously. Psych: Does not  responds to questions appropriately with a normal affect.   Intake/Output Summary (Last 24 hours) at 05/11/2021 1310 Last data filed at 05/11/2021 1246 Gross per 24 hour  Intake 760.29 ml  Output 550 ml  Net 210.29 ml    Inpatient Medications:  . allopurinol  200 mg Per Tube q AM  . aspirin  81 mg Per Tube Daily  . [START ON 05/12/2021] aspirin  81 mg Oral Pre-Cath  . atorvastatin  40 mg Per Tube q AM  . budesonide (PULMICORT) nebulizer solution  0.5 mg Nebulization BID  . chlorhexidine gluconate (MEDLINE KIT)  15 mL Mouth Rinse BID  . Chlorhexidine Gluconate Cloth  6 each Topical Q0600  . cholecalciferol  1,000 Units Per Tube Daily  . docusate  100 mg Per Tube BID  . feeding supplement (PROSource TF)  90 mL Per Tube TID  . fentaNYL (SUBLIMAZE) injection  50 mcg Intravenous Once  . free water  30 mL Per Tube Q4H  . furosemide  80 mg Per Tube Daily  . heparin  5,000 Units Subcutaneous Q8H  . insulin aspart  0-6 Units Subcutaneous Q4H  . insulin glargine  10 Units Subcutaneous QHS  . ipratropium-albuterol  3 mL Nebulization Q4H  . mouth rinse  15 mL Mouth Rinse 10 times per day  . methylPREDNISolone (SOLU-MEDROL) injection  20 mg Intravenous BID  . multivitamin  1 tablet Per Tube QHS  . pantoprazole (PROTONIX) IV  40 mg Intravenous Q24H  . polyethylene glycol  17 g Per Tube Daily  . sodium chloride flush  3 mL Intravenous Q12H  . sodium chloride flush  3 mL Intravenous Q12H  . triamcinolone cream   Topical BID   Infusions:  . sodium chloride    .  sodium chloride    . sodium chloride    . sodium chloride    . [START ON 05/12/2021] sodium chloride     Followed by  . [START ON 05/12/2021] sodium chloride    . ampicillin-sulbactam (UNASYN) IV Stopped (05/10/21 2358)  . famotidine (PEPCID) IV Stopped (05/10/21 2227)  . feeding supplement (VITAL 1.5 CAL) 45 mL/hr at 05/11/21 0100  . fentaNYL infusion INTRAVENOUS 150 mcg/hr (05/11/21 1246)  . midazolam 4 mg/hr (05/11/21 1251)     Labs: Recent Labs    05/10/21 0537 05/11/21 0534  NA 138 138  K 4.3 5.8*  CL 99 100  CO2 24 23  GLUCOSE 131* 236*  BUN 64* 95*  CREATININE 10.45* 11.42*  CALCIUM 8.5* 8.6*  MG 2.5* 2.5*  PHOS 7.7* 8.2*   No results for input(s): AST, ALT, ALKPHOS, BILITOT, PROT, ALBUMIN in the last 72 hours. Recent Labs    05/10/21 0537 05/11/21 0534  WBC 13.3* 10.7*  NEUTROABS 10.7* 9.9*  HGB 10.1* 9.4*  HCT 31.2* 29.7*  MCV 89.1 89.2  PLT 208 198   No results for input(s): CKTOTAL, CKMB, TROPONINI in the last 72 hours. Invalid input(s): POCBNP Recent Labs    05/09/21 0453  HGBA1C 7.1*     Weights: Filed Weights   05/09/21 0439 05/10/21 0436 05/11/21 0448  Weight: 123.8 kg 123.6 kg 122 kg     Radiology/Studies:  CT Head Wo Contrast  Result Date: 05/08/2021 CLINICAL DATA:  Altered mental status, history of fall yesterday, initial encounter EXAM: CT HEAD WITHOUT CONTRAST CT CERVICAL SPINE WITHOUT CONTRAST TECHNIQUE: Multidetector CT imaging of the head and cervical spine was performed following the standard protocol without intravenous contrast. Multiplanar CT image reconstructions of the cervical spine were also generated. COMPARISON:  01/27/2021 FINDINGS: CT HEAD FINDINGS Brain: No evidence of acute infarction, hemorrhage, hydrocephalus, extra-axial collection or mass lesion/mass effect. Chronic atrophic and ischemic changes are again identified and stable. Vascular: No hyperdense vessel or unexpected calcification. Skull: Normal. Negative for fracture or focal lesion. Sinuses/Orbits: No acute finding. Other: None. CT CERVICAL SPINE FINDINGS Alignment: Normal. Skull base and vertebrae: 7 cervical segments are well visualized. Vertebral body height is well maintained. Disc space narrowing and osteophytic changes are noted. No acute fracture or acute facet abnormality is noted. Soft tissues and spinal canal: Surrounding soft tissue structures are within normal limits. Upper chest:  Visualized lung apices are within normal limits. Other: None IMPRESSION: CT of the head: Chronic atrophic and ischemic changes without acute abnormality. CT of the cervical spine: Multilevel degenerative change without acute abnormality. Electronically Signed   By: Mark  Lukens M.D.   On: 05/08/2021 08:54   CT CERVICAL SPINE WO CONTRAST  Result Date: 05/08/2021 CLINICAL DATA:  Altered mental status, history of fall yesterday, initial encounter EXAM: CT HEAD WITHOUT CONTRAST CT CERVICAL SPINE WITHOUT CONTRAST TECHNIQUE: Multidetector CT imaging of the head and cervical spine was performed following the standard protocol without intravenous contrast. Multiplanar CT image reconstructions of the cervical spine were also generated. COMPARISON:  01/27/2021 FINDINGS: CT HEAD FINDINGS Brain: No evidence of acute infarction, hemorrhage, hydrocephalus, extra-axial collection or mass lesion/mass effect. Chronic atrophic and ischemic changes are again identified and stable. Vascular: No hyperdense vessel or unexpected calcification. Skull: Normal. Negative for fracture or focal lesion. Sinuses/Orbits: No acute finding. Other: None. CT CERVICAL SPINE FINDINGS Alignment: Normal. Skull base and vertebrae: 7 cervical segments are well visualized. Vertebral body height is well maintained. Disc space narrowing and osteophytic changes are   noted. No acute fracture or acute facet abnormality is noted. Soft tissues and spinal canal: Surrounding soft tissue structures are within normal limits. Upper chest: Visualized lung apices are within normal limits. Other: None IMPRESSION: CT of the head: Chronic atrophic and ischemic changes without acute abnormality. CT of the cervical spine: Multilevel degenerative change without acute abnormality. Electronically Signed   By: Mark  Lukens M.D.   On: 05/08/2021 08:54   PERIPHERAL VASCULAR CATHETERIZATION  Result Date: 05/02/2021 See Op Note  DG Chest Port 1 View  Result Date:  05/11/2021 CLINICAL DATA:  Line placement. EXAM: PORTABLE CHEST 1 VIEW COMPARISON:  Same day. FINDINGS: Stable cardiomegaly. Endotracheal and nasogastric tubes are unchanged in position. Interval placement right internal jugular catheter with distal tip in expected position of the SVC. No pneumothorax is noted. Mild bibasilar subsegmental atelectasis is noted. Bony thorax is unremarkable. IMPRESSION: Interval placement of right internal jugular catheter with distal tip in expected position of the SVC. No pneumothorax is noted. Electronically Signed   By: James  Green Jr M.D.   On: 05/11/2021 12:39   DG Chest Port 1 View  Result Date: 05/11/2021 CLINICAL DATA:  Acute respiratory failure with hypoxia. EXAM: PORTABLE CHEST 1 VIEW COMPARISON:  May 08, 2021. FINDINGS: Endotracheal tube tip is approximately 2.8 cm above the carina. Enteric tube courses below the diaphragm in outside the field of view. Similar enlarged cardiac silhouette, accentuated by AP technique. Pulmonary vascular congestion. Patchy airspace opacities, slightly increased at the left lung base. Similar possible small left pleural effusion. No visible pneumothorax on this limited semi erect AP radiograph. IMPRESSION: 1. Similar cardiomegaly and pulmonary vascular congestion. Possible small left pleural effusion. 2. Patchy airspace opacities (slightly increased at the left lung base), which could represent edema and/or infection. Electronically Signed   By: Frederick S Jones MD   On: 05/11/2021 11:12   DG Chest Port 1 View  Result Date: 05/08/2021 CLINICAL DATA:  Endotracheal and orogastric tube placement. EXAM: PORTABLE CHEST 1 VIEW COMPARISON:  Radiograph earlier today FINDINGS: Endotracheal tube tip is 19 mm from the carina. Enteric tube in place with tip below the diaphragm not included in the field of view. Low lung volumes. Cardiomegaly. Increasing patchy bibasilar opacities. Possible small right pleural effusion and fluid in the fissure.  Perivascular haziness. Right subclavian stent again seen. No pneumothorax. IMPRESSION: 1. Endotracheal tube tip 19 mm from the carina. Enteric tube in place with tip below the diaphragm not included in the field of view. 2. Low lung volumes with increasing patchy bibasilar opacities. Cardiomegaly with vascular congestion. Possible small right pleural effusion. Electronically Signed   By: Melanie  Sanford M.D.   On: 05/08/2021 19:22   DG Chest Portable 1 View  Result Date: 05/08/2021 CLINICAL DATA:  Altered mental status, weakness, right shoulder pain. EXAM: PORTABLE CHEST 1 VIEW COMPARISON:  01/27/2021 and CT chest 11/23/2016. FINDINGS: Trachea is midline. Heart is enlarged. Central interstitial prominence and indistinctness. Thickening of the minor fissure. Slight blunting of the right costophrenic angle. IMPRESSION: Mild congestive heart failure. Difficult to exclude a viral or atypical pneumonia. Electronically Signed   By: Melinda  Blietz M.D.   On: 05/08/2021 08:11   DG Shoulder Right Portable  Result Date: 05/08/2021 CLINICAL DATA:  Right shoulder pain Altered mental status Weakness EXAM: PORTABLE RIGHT SHOULDER COMPARISON:  06/21/2019 FINDINGS: No fracture or dislocation. Mild spurring of the acromioclavicular joint. Multiple right axillary vascular stents are noted. IMPRESSION: Mild right AC joint osteoarthrosis. Electronically Signed   By: Farhaan    Mir M.D.   On: 05/08/2021 08:13   DG Abd Portable 1V  Result Date: 05/08/2021 CLINICAL DATA:  Orogastric tube placement. EXAM: PORTABLE ABDOMEN - 1 VIEW COMPARISON:  None. FINDINGS: Tip and side port of the enteric tube below the diaphragm in the stomach. Nonobstructed upper abdominal bowel gas pattern. IMPRESSION: Tip and side port of the enteric tube below the diaphragm in the stomach. Electronically Signed   By: Keith Rake M.D.   On: 05/08/2021 19:23     Assessment and Recommendation  55 y.o. male with known end-stage renal disease status  postdialysis hypertension hyperlipidemia and known moderate segmental LV systolic dysfunction with previous PCI and stent placement of right coronary artery having V. fib arrest of unknown etiology without evidence of acute coronary syndrome at this time intubated but hemodynamically stable.  Consensus with multiple cardiology consultants suggest patient should be stabilized medically and continue dialysis prior to further invasive diagnostic studies.  This includes cardiac catheterization. 1.  Continue supportive care for hypoxia and respiratory failure with intubation pulmonary toilet and antibiotics for possible respiratory difficulties and aspiration 2.  Echocardiogram for reevaluation of extent of LV systolic dysfunction which could be contributing to above 3.  Further consideration of cardiac catheterization after stabilization of above 4.  Dr. Saralyn Pilar covering this weekend  Signed, Serafina Royals M.D. FACC

## 2021-05-12 LAB — GLUCOSE, CAPILLARY
Glucose-Capillary: 182 mg/dL — ABNORMAL HIGH (ref 70–99)
Glucose-Capillary: 190 mg/dL — ABNORMAL HIGH (ref 70–99)
Glucose-Capillary: 206 mg/dL — ABNORMAL HIGH (ref 70–99)
Glucose-Capillary: 211 mg/dL — ABNORMAL HIGH (ref 70–99)
Glucose-Capillary: 216 mg/dL — ABNORMAL HIGH (ref 70–99)
Glucose-Capillary: 230 mg/dL — ABNORMAL HIGH (ref 70–99)
Glucose-Capillary: 235 mg/dL — ABNORMAL HIGH (ref 70–99)

## 2021-05-12 LAB — HEPATITIS B SURFACE ANTIGEN: Hepatitis B Surface Ag: NONREACTIVE

## 2021-05-12 LAB — BASIC METABOLIC PANEL
Anion gap: 15 (ref 5–15)
BUN: 79 mg/dL — ABNORMAL HIGH (ref 6–20)
CO2: 24 mmol/L (ref 22–32)
Calcium: 8.9 mg/dL (ref 8.9–10.3)
Chloride: 97 mmol/L — ABNORMAL LOW (ref 98–111)
Creatinine, Ser: 8.98 mg/dL — ABNORMAL HIGH (ref 0.61–1.24)
GFR, Estimated: 7 mL/min — ABNORMAL LOW (ref >60)
Glucose, Bld: 247 mg/dL — ABNORMAL HIGH (ref 70–99)
Potassium: 5.1 mmol/L (ref 3.5–5.1)
Sodium: 136 mmol/L (ref 135–145)

## 2021-05-12 LAB — CBC WITH DIFFERENTIAL/PLATELET
Abs Immature Granulocytes: 0.08 10*3/uL — ABNORMAL HIGH (ref 0.00–0.07)
Basophils Absolute: 0 10*3/uL (ref 0.0–0.1)
Basophils Relative: 0 %
Eosinophils Absolute: 0 10*3/uL (ref 0.0–0.5)
Eosinophils Relative: 0 %
HCT: 29 % — ABNORMAL LOW (ref 39.0–52.0)
Hemoglobin: 9.3 g/dL — ABNORMAL LOW (ref 13.0–17.0)
Immature Granulocytes: 1 %
Lymphocytes Relative: 6 %
Lymphs Abs: 0.7 10*3/uL (ref 0.7–4.0)
MCH: 28.4 pg (ref 26.0–34.0)
MCHC: 32.1 g/dL (ref 30.0–36.0)
MCV: 88.7 fL (ref 80.0–100.0)
Monocytes Absolute: 0.5 10*3/uL (ref 0.1–1.0)
Monocytes Relative: 4 %
Neutro Abs: 10.9 10*3/uL — ABNORMAL HIGH (ref 1.7–7.7)
Neutrophils Relative %: 89 %
Platelets: 211 10*3/uL (ref 150–400)
RBC: 3.27 MIL/uL — ABNORMAL LOW (ref 4.22–5.81)
RDW: 14.6 % (ref 11.5–15.5)
WBC: 12.2 10*3/uL — ABNORMAL HIGH (ref 4.0–10.5)
nRBC: 0.2 % (ref 0.0–0.2)

## 2021-05-12 LAB — PHOSPHORUS: Phosphorus: 7.6 mg/dL — ABNORMAL HIGH (ref 2.5–4.6)

## 2021-05-12 LAB — MAGNESIUM: Magnesium: 2.1 mg/dL (ref 1.7–2.4)

## 2021-05-12 MED ORDER — INSULIN GLARGINE 100 UNIT/ML ~~LOC~~ SOLN
15.0000 [IU] | Freq: Every day | SUBCUTANEOUS | Status: DC
  Administered 2021-05-12 – 2021-05-18 (×7): 15 [IU] via SUBCUTANEOUS
  Filled 2021-05-12 (×8): qty 0.15

## 2021-05-12 MED ORDER — IPRATROPIUM-ALBUTEROL 0.5-2.5 (3) MG/3ML IN SOLN
3.0000 mL | Freq: Four times a day (QID) | RESPIRATORY_TRACT | Status: DC
  Administered 2021-05-12 – 2021-05-16 (×17): 3 mL via RESPIRATORY_TRACT
  Filled 2021-05-12 (×18): qty 3

## 2021-05-12 MED ORDER — INSULIN ASPART 100 UNIT/ML IJ SOLN
0.0000 [IU] | INTRAMUSCULAR | Status: DC
  Administered 2021-05-12: 2 [IU] via SUBCUTANEOUS
  Administered 2021-05-12 (×2): 3 [IU] via SUBCUTANEOUS
  Administered 2021-05-13 (×3): 2 [IU] via SUBCUTANEOUS
  Administered 2021-05-13: 3 [IU] via SUBCUTANEOUS
  Administered 2021-05-13: 2 [IU] via SUBCUTANEOUS
  Filled 2021-05-12 (×8): qty 1

## 2021-05-12 MED ORDER — EPOETIN ALFA 10000 UNIT/ML IJ SOLN
4000.0000 [IU] | INTRAMUSCULAR | Status: DC
  Administered 2021-05-15 – 2021-05-17 (×2): 4000 [IU] via INTRAVENOUS
  Filled 2021-05-12 (×2): qty 1

## 2021-05-12 NOTE — TOC Initial Note (Signed)
Transition of Care [facility]) - Initial/Assessment Note    Patient Details  Name: [name] MRN: [id number] Date of Birth: [birth year]  Transition of Care [facility]) CM/SW Contact:    Adelene Amas, [place] Phone Number: 05/12/2021, 1:26 PM  Clinical Narrative:                  Patient presents to [facility] due due to altered mental status and difficulty following commands during HD treatment.  Patient has been weaned off ventilator, FiO2 45%, patient is awake but still experiencing fluctuation orientation. Main contacts are [name] (daughter) [phone] and Arlyn Buerkle (son) [phone].  CSW spoke with patient's daughter Ms. Lavina Hamman to discuss discharge plans.  CSW explained the role of TOC in patient care.  CSW discussed the difference between [facility], CIR, SNF and Home Health, the placement estimated timeline and process.  Ms. Lavina Hamman verbalized understanding.  CSW stated I would continue to follow the patient and encouraged Ms. Gibbs to contact me if she had any further questions about discharge plans.    Expected Discharge Plan: Skilled Nursing Facility Barriers to Discharge: Continued Medical Work up,SNF Pending bed offer   Patient Goals and CMS Choice   CMS Medicare.gov Compare Post Acute Care list provided to::  Lavina Hamman, Loma Sousa (Daughter)   [phone]) Choice offered to / list presented to : Adult Children  Expected Discharge Plan and Services Expected Discharge Plan: [place] In-house Referral: Clinical Social Work   Post Acute Care Choice: [place] Living arrangements for the past 2 months: Apartment                                      Prior Living Arrangements/Services Living arrangements for the past 2 months: Apartment Lives with:: Self Patient language and need for interpreter reviewed:: Yes Do you feel safe going back to the place where you live?: Yes      Need for Family Participation in Patient Care: Yes  (Comment)     Criminal Activity/Legal Involvement Pertinent to Current Situation/Hospitalization: No - Comment as needed  Activities of Daily Living      Permission Sought/Granted Permission sought to share information with : Facility Sport and exercise psychologist    Share Information with NAME: [name] (Daughter)   [phone]           Emotional Assessment Appearance:: Appears stated age Attitude/Demeanor/Rapport: Unable to Assess Affect (typically observed): Unable to Assess Orientation: : Fluctuating Orientation (Suspected and/or reported [place])   Psych Involvement: No (comment)  Admission diagnosis:  Hyperkalemia [E87.5] Stupor [R40.1] Acute respiratory failure with hypoxia (HCC) [J96.01] Chronic respiratory failure with hypoxia (HCC) [J96.11] ESRD on hemodialysis ([place]) [N18.6, Z99.2] Type 2 diabetes mellitus without complication, with long-term current use of insulin (HCC) [E11.9, Z79.4] Chronic congestive heart failure, unspecified heart failure type [facility]) [I50.9] Patient Active Problem List   Diagnosis Date Noted  . Acute respiratory failure with hypoxia ([place]) 05/08/2021  . COPD (chronic obstructive pulmonary disease) ([place])   . HLD (hyperlipidemia)   . Fall at home, initial encounter   . Acute metabolic encephalopathy   . Uremia   . Elevated troponin   . Leukocytosis   . Chronic respiratory failure with hypoxia ([place])   . Type II diabetes mellitus with renal manifestations ([place])   . Hypotension 01/29/2021  . Volume overload 01/29/2021  . Hemodialysis patient ([place]) 06/28/2020  . Complication from renal dialysis device 03/09/2020  .  Background diabetic retinopathy associated with type 2 diabetes mellitus ([place]) 10/27/2019  . Hyperkalemia 06/15/2019  . Type 2 diabetes mellitus with hyperglycemia, with long-term current use of insulin ([place]) 03/05/2019  . Hypoglycemia 02/18/2019  . Altered mental status 02/18/2019  . RLQ abdominal mass 01/20/2019  . CHF  (congestive heart failure) ([place]) 10/07/2018  . Muscle cramps 10/07/2018  . Shortness of breath 09/08/2018  . Morbid obesity ([place]) 06/24/2018  . Acute on chronic respiratory failure with hypoxia ([place]) 04/26/2018  . Morbid obesity with BMI of 50.0-59.9, adult ([place]) 04/23/2018  . Chronic diastolic heart failure ([place]) 02/10/2018  . Obstructive sleep apnea 02/10/2018  . Obesity hypoventilation syndrome ([place]) 09/26/2017  . Diabetes mellitus without complication ([place]) 77/41/2878  . CAD (coronary artery disease) 01/31/2017  . Hyperlipidemia, unspecified 01/31/2017  . Essential hypertension 01/17/2017  . Psoriasis 01/17/2017  . Chronic gout of multiple sites 12/21/2016  . DM type 2 with diabetic peripheral neuropathy ([place]) 06/25/2016  . Cardiomyopathy ([place]) 08/19/2012  . ESRD (end stage renal disease) on dialysis ([place]) 08/19/2012  . Hyperlipidemia associated with type 2 diabetes mellitus ([place]) 12/17/1998   PCP:  [facility]:   [place], [place] - [address] [place] [address] Phone: [phone] Fax: [place], [place] [address] [place] [address] Phone: [phone] Fax: [phone]     Social Determinants of Health (SDOH) Interventions    Readmission Risk Interventions No flowsheet data found.

## 2021-05-12 NOTE — Progress Notes (Signed)
Patient during HD treatment with an initial BFR of 150 with increasing rate 25 to max of 275. Patient remained stable, catheter functioned well, tolerated treatment without incident.

## 2021-05-12 NOTE — Progress Notes (Signed)
Dialysis imitated at 1528, BFR increased as ordered, but arterial pressures kept increasing.  Lines switched and subsequently venous pressures started to increase. NP notified, heparin given as ordered.  At 1715 venous pressure in 400's, NP notified, patient rinse backed, unable to cannulate patient secondary to swelling in L forearm.

## 2021-05-12 NOTE — Progress Notes (Addendum)
Pt remains on vent, multiple vent changes made this AM (fio2, peep, and rr weaned). Tube feeds held this AM per ICU MD. Pt remains on fent and versed gtt's. 1x Fent bolus given today d/t increased anxiety/agitation. Dialysis completed today (1.1 L removed dialysis RN). Daughter at bedside.   Pt bladder scanned 2x today:  0930 = 181  1700 = 262

## 2021-05-12 NOTE — Progress Notes (Signed)
NAME:  [name], MRN:  [id number], DOB:  1966-10-12, LOS: 4 ADMISSION DATE:  05/08/2021  55 y.o.malewith a history ofdCHF,ESRD on hemodialysis COPD,diabetes morbid obesity who presents to ER with severealtered mental status. Had a fall hitting the left side of his forehead yesterday.   Went to dialysis this morning where he was noted to be somnolent, poorly interactive, not able to follow commands. +headache.    Pt unable to complete dialysis today, per EMS pt had fall yesterday, at dialysis patient kept falling asleep, unable to determine baseline mentation due to being a new dialysis site. Pt with bruising and swelling present to left eye. Pt also complains of back pain and a headache.  Patient subsequently developed acute SOB and hypoxia failed biPAP and emergently intubated   05/12/21- patient is weaned on ventilator to Fio2 45%.  Holding OGF for SBT today.  He is awake RASS-0 during my evaluation. Labs reviewed with mild trend up of wbc hb stable 9.3. POC glucose 216. On unasyn day 5, blood cultures negative.      Antibiotics Given (last 72 hours)    Date/Time Action Medication Dose Rate   05/09/21 2310 New Bag/Given   Ampicillin-Sulbactam (UNASYN) 3 g in sodium chloride 0.9 % 100 mL IVPB 3 g 200 mL/hr   05/10/21 2328 New Bag/Given   Ampicillin-Sulbactam (UNASYN) 3 g in sodium chloride 0.9 % 100 mL IVPB 3 g 200 mL/hr   05/11/21 2229 New Bag/Given   Ampicillin-Sulbactam (UNASYN) 3 g in sodium chloride 0.9 % 100 mL IVPB 3 g 200 mL/hr           Objective   Blood pressure (!) 143/82, pulse 63, temperature 99.14 F (37.3 C), resp. rate 19, height 5\' 3"  (1.6 m), weight 122 kg, SpO2 94 %.    Vent Mode: PRVC FiO2 (%):  [45 %-60 %] 45 % Set Rate:  [22 bmp] 22 bmp Vt Set:  [450 mL] 450 mL PEEP:  [8 cmH20-10 cmH20] 8 cmH20 Plateau Pressure:  [22 cmH20-25 cmH20] 22 cmH20   Intake/Output Summary (Last 24 hours) at 05/12/2021 1011 Last data filed at  05/12/2021 0600 Gross per 24 hour  Intake 3377.53 ml  Output 2025 ml  Net 1352.53 ml   Filed Weights   05/09/21 0439 05/10/21 0436 05/11/21 0448  Weight: 123.8 kg 123.6 kg 122 kg      REVIEW OF SYSTEMS  PATIENT IS UNABLE TO PROVIDE COMPLETE REVIEW OF SYSTEMS DUE TO SEVERE CRITICAL ILLNESS AND TOXIC METABOLIC ENCEPHALOPATHY   PHYSICAL EXAMINATION:  GENERAL:critically ill appearing, +resp distress HEAD: Normocephalic, atraumatic.  EYES: Pupils equal, round, reactive to light.  No scleral icterus.  MOUTH: Moist mucosal membrane. NECK: Supple. PULMONARY: +rhonchi, +wheezing CARDIOVASCULAR: S1 and S2. Regular rate and rhythm. No murmurs, rubs, or gallops.  GASTROINTESTINAL: Soft, nontender, -distended. Positive bowel sounds.  MUSCULOSKELETAL: No swelling, clubbing, or edema.  NEUROLOGIC: obtunded SKIN:intact,warm,dry   Labs/imaging that I havepersonally reviewed  (right click and "Reselect all SmartList Selections" daily)       ASSESSMENT AND PLAN SYNOPSIS  55 yo morbidly obese AAM with severe acute hypoxic respiratory failure post HD with aspiration pna    Severe ACUTE Hypoxic and Hypercapnic Respiratory Failure -continue Mechanical Ventilator support -continue Bronchodilator Therapy -Wean Fio2 and PEEP as tolerated -VAP/VENT bundle implementation Vent Mode: PRVC FiO2 (%):  [45 %-60 %] 45 % Set Rate:  [22 bmp] 22 bmp Vt Set:  [450 mL] 450 mL PEEP:  [8 cmH20-10 cmH20] Anna  Pressure:  [22 cmH20-25 cmH20] 22 cmH20   SEVERE COPD EXACERBATION -continue IV steroids as prescribed -continue NEB THERAPY as prescribed   CARDIAC FAILURE-cardiac arrest +ischemic cardiomyopathy ECHO pending Cardiology consulted May consider [place] ICU monitoring   ESRD ON HD -continue Foley Catheter-assess need -Avoid nephrotoxic agents -Follow urine output, BMP -Ensure adequate renal perfusion, optimize oxygenation -Renal dose medications HD as needed  tolerated  NEUROLOGY Acute toxic metabolic encephalopathy, need for sedation Goal RASS -2 to -3   INFECTIOUS DISEASE Therapy for aspiration pneumonia RARE GRAM POSITIVE COCCI IN PAIRS  RARE GRAM VARIABLE ROD  -continue antibiotics as prescribed -follow up cultures   ENDO - ICU hypoglycemic\Hyperglycemia protocol -check FSBS per protocol   GI GI PROPHYLAXIS as indicated  NUTRITIONAL STATUS DIET-->TF's as tolerated Constipation protocol as indicated   ELECTROLYTES -follow labs as needed -replace as needed -pharmacy consultation and following     Best practice (right click and "Reselect all SmartList Selections" daily)  Diet:NPO Pain/Anxiety/Delirium protocol (if indicated):Yes(RASS goal -2) VAP protocol (if indicated):Yes DVT prophylaxis:Subcutaneous Heparin GI prophylaxis:H2B Glucose control:SSIYes Central venous access:N/A Arterial line:N/A Foley:N/A Mobility:bed rest PT consulted:N/A Code Status:full code Disposition:ICU      Labs   CBC: Recent Labs  Lab 05/08/21 0859 05/09/21 0453 05/09/21 2233 05/10/21 0537 05/11/21 0534 05/12/21 0615  WBC 13.8* 14.2* 15.0* 13.3* 10.7* 12.2*  NEUTROABS 11.3*  --  11.3* 10.7* 9.9* 10.9*  HGB 11.6* 10.9* 11.4* 10.1* 9.4* 9.3*  HCT 36.9* 33.9* 36.0* 31.2* 29.7* 29.0*  MCV 91.6 90.6 90.7 89.1 89.2 88.7  PLT 226 193 211 208 198 008    Basic Metabolic Panel: Recent Labs  Lab 05/09/21 0453 05/09/21 2233 05/10/21 0537 05/11/21 0534 05/12/21 0615  NA 137 137 138 138 136  K 5.8* 4.0 4.3 5.8* 5.1  CL 99 98 99 100 97*  CO2 23 22 24 23 24   GLUCOSE 65* 101* 131* 236* 247*  BUN 69* 55* 64* 95* 79*  CREATININE 10.65* 8.96* 10.45* 11.42* 8.98*  CALCIUM 8.5* 8.5* 8.5* 8.6* 8.9  MG 1.6* 1.5* 2.5* 2.5* 2.1  PHOS 8.5* 5.7* 7.7* 8.2* 7.6*   GFR: Estimated Creatinine Clearance: 11.3 mL/min (A) (by C-G formula based on SCr of 8.98 mg/dL (H)). Recent Labs  Lab 05/09/21 2233  05/10/21 0537 05/11/21 0534 05/12/21 0615  WBC 15.0* 13.3* 10.7* 12.2*    Liver Function Tests: Recent Labs  Lab 05/08/21 0859  AST 13*  ALT 16  ALKPHOS 145*  BILITOT 0.9  PROT 8.6*  ALBUMIN 4.3   No results for input(s): LIPASE, AMYLASE in the last 168 hours. Recent Labs  Lab 05/08/21 0859  AMMONIA 25    ABG    Component Value Date/Time   PHART 7.31 (L) 05/09/2021 0434   PCO2ART 50 (H) 05/09/2021 0434   PO2ART 124 (H) 05/09/2021 0434   HCO3 25.2 05/09/2021 0434   TCO2 30 02/24/2020 0635   ACIDBASEDEF 1.4 05/09/2021 0434   O2SAT 98.5 05/09/2021 0434     Coagulation Profile: No results for input(s): INR, PROTIME in the last 168 hours.  Cardiac Enzymes: No results for input(s): CKTOTAL, CKMB, CKMBINDEX, TROPONINI in the last 168 hours.  HbA1C: Hgb A1c MFr Bld  Date/Time Value Ref Range Status  05/09/2021 04:53 AM 7.1 (H) 4.8 - 5.6 % Final    Comment:    (NOTE)         Prediabetes: 5.7 - 6.4         Diabetes: >6.4  Glycemic control for adults with diabetes: <7.0   01/28/2021 01:24 AM 5.6 4.8 - 5.6 % Final    Comment:    (NOTE) Pre diabetes:          5.7%-6.4%  Diabetes:              >6.4%  Glycemic control for   <7.0% adults with diabetes     CBG: Recent Labs  Lab 05/11/21 1921 05/11/21 2123 05/12/21 0053 05/12/21 0417 05/12/21 0722  GLUCAP 185* 215* 230* 235* 216*    Allergies Allergies  Allergen Reactions  . Shellfish Allergy Anaphylaxis    Face and throat swelling, difficulty breathing Allergy can be triggered by touching (contact)  . Other   . Betadine [Povidone Iodine] Rash  . Povidone-Iodine Rash       DVT/GI PRX  assessed I Assessed the need for Labs I Assessed the need for Foley I Assessed the need for Central Venous Line Family Discussion when available I Assessed the need for Mobilization I made an Assessment of medications to be adjusted accordingly Safety Risk assessment completed  [place] ICU TEAM    Critical care provider statement:    Critical care time (minutes):  109   Critical care time was exclusive of:  Separately billable procedures and  treating other patients   Critical care was necessary to treat or prevent imminent or  life-threatening deterioration of the following conditions:  acute hypoxemic respiratory failure, severe copd exacerbation , acute CHF , obesity, multiple comorbid conditions   Critical care was time spent personally by me on the following  activities:  Development of treatment plan with patient or surrogate,  discussions with consultants, evaluation of patient's response to  treatment, examination of patient, obtaining history from patient or  surrogate, ordering and performing treatments and interventions, ordering  and review of laboratory studies and re-evaluation of patient's condition   I assumed direction of critical care for this patient from another  provider in my specialty: no      Ottie Glazier, M.D.  [facility]

## 2021-05-12 NOTE — Progress Notes (Signed)
[facility]  ROUNDING NOTE   Subjective:   Patient with significant decompensation on May 19, 2021 V-fib Coded during dialysis 2021/05/19 Arousable to name and nod yes/no to questions Intubated Fentanyl and Midazolam gtt UOP 564m  Objective:  Vital signs in last 24 hours:  Temp:  [97.7 F (36.5 C)-99.5 F (37.5 C)] 99.32 F (37.4 C) (05/27 0800) Pulse Rate:  [56-73] 73 (05/27 0800) Resp:  [17-23] 22 (05/27 0800) BP: (119-152)/(57-95) 137/85 (05/27 0800) SpO2:  [91 %-99 %] 91 % (05/27 0800) FiO2 (%):  [45 %-60 %] 45 % (05/27 0745)  Weight change:  Filed Weights   [id number]/06/[id number] 05/10/21 0436 05/11/21 0448  Weight: 123.8 kg 123.6 kg 122 kg    Intake/Output: I/O last 3 completed shifts: In: 3721 [I.V.:789; NWT/UU:8280 IV Piggyback:250] Out: 2025 [Urine:525; Other:1500]   Intake/Output this shift:  No intake/output data recorded.  Physical Exam: General: Critically ill-appearing  Head: Normocephalic, atraumatic. Moist oral mucosal membranes  Eyes: Anicteric, Left periorbital erythema and edema  Lungs:  Scattered rhonchi, vent assisted  Heart: Regular rate and rhythm  Abdomen:  Soft, nontender, BS present  Extremities: 1+ lower extremity edema  Neurologic: Intubated, following commands, aroused to name  Skin: Rash noted b/l LE's  Access: Rt AVF, Rt Trialysis cath    Basic Metabolic Panel: Recent Labs  Lab [id number] 05/10/21 0537 05/11/21 0534 05/12/21 0615  NA 137 137 138 138 136  K 5.8* 4.0 4.3 5.8* 5.1  CL 99 98 99 100 97*  CO2 23 22 24 23 24   GLUCOSE 65* 101* 131* 236* 247*  BUN 69* 55* 64* 95* 79*  CREATININE 10.65* 8.96* 10.45* 11.42* 8.98*  CALCIUM 8.5* 8.5* 8.5* 8.6* 8.9  MG 1.6* 1.5* 2.5* 2.5* 2.1  PHOS 8.5* 5.7* 7.7* 8.2* 7.6*    Liver Function Tests: Recent Labs  Lab 05/08/21 0859  AST 13*  ALT 16  ALKPHOS 145*  BILITOT 0.9  PROT 8.6*  ALBUMIN 4.3   No results for input(s): LIPASE, AMYLASE in the last 168  hours. Recent Labs  Lab 05/08/21 0859  AMMONIA 25    CBC: Recent Labs  Lab 05/08/21 0859 [id number] 05/10/21 0537 05/11/21 0534 05/12/21 0615  WBC 13.8* 14.2* 15.0* 13.3* 10.7* 12.2*  NEUTROABS 11.3*  --  11.3* 10.7* 9.9* 10.9*  HGB 11.6* 10.9* 11.4* 10.1* 9.4* 9.3*  HCT 36.9* 33.9* 36.0* 31.2* 29.7* 29.0*  MCV 91.6 90.6 90.7 89.1 89.2 88.7  PLT 226 193 211 208 198 211    Cardiac Enzymes: No results for input(s): CKTOTAL, CKMB, CKMBINDEX, TROPONINI in the last 168 hours.  BNP: Invalid input(s): POCBNP  CBG: Recent Labs  Lab 05/11/21 1921 05/11/21 2123 05/12/21 0053 05/12/21 0417 05/12/21 0722  GLUCAP 185* 215* 230* 235* 216*    Microbiology: Results for orders placed or performed during the hospital encounter of 05/08/21  Resp Panel by RT-PCR (Flu A&B, Covid) Nasopharyngeal Swab     Status: None   Collection Time: 05/08/21  8:04 AM   Specimen: Nasopharyngeal Swab; Nasopharyngeal(NP) swabs in vial transport medium  Result Value Ref Range Status   SARS Coronavirus 2 by RT PCR NEGATIVE NEGATIVE Final    Comment: (NOTE) SARS-CoV-2 target nucleic acids are NOT DETECTED.  The SARS-CoV-2 RNA is generally detectable in upper respiratory specimens during the acute phase of infection. The lowest concentration of SARS-CoV-2 viral copies this assay can detect is 138 copies/mL. A negative result does not preclude SARS-Cov-2 infection and should not  be used as the sole basis for treatment or other patient management decisions. A negative result may occur with  improper specimen collection/handling, submission of specimen other than nasopharyngeal swab, presence of viral mutation(s) within the areas targeted by this assay, and inadequate number of viral copies(<138 copies/mL). A negative result must be combined with clinical observations, patient history, and epidemiological information. The expected result is Negative.  Fact Sheet for Patients:   EntrepreneurPulse.com.au  Fact Sheet for Healthcare Providers:  IncredibleEmployment.be  This test is no t yet approved or cleared by the [place] FDA and  has been authorized for detection and/or diagnosis of SARS-CoV-2 by FDA under an Emergency Use Authorization (EUA). This EUA will remain  in effect (meaning this test can be used) for the duration of the COVID-19 declaration under Section 564(b)(1) of the Act, 21 U.S.C.section 360bbb-3(b)(1), unless the authorization is terminated  or revoked sooner.       Influenza A by PCR NEGATIVE NEGATIVE Final   Influenza B by PCR NEGATIVE NEGATIVE Final    Comment: (NOTE) The Xpert Xpress SARS-CoV-2/FLU/RSV plus assay is intended as an aid in the diagnosis of influenza from Nasopharyngeal swab specimens and should not be used as a sole basis for treatment. Nasal washings and aspirates are unacceptable for Xpert Xpress SARS-CoV-2/FLU/RSV testing.  Fact Sheet for Patients: EntrepreneurPulse.com.au  Fact Sheet for Healthcare Providers: IncredibleEmployment.be  This test is not yet approved or cleared by the [place] FDA and has been authorized for detection and/or diagnosis of SARS-CoV-2 by FDA under an Emergency Use Authorization (EUA). This EUA will remain in effect (meaning this test can be used) for the duration of the COVID-19 declaration under Section 564(b)(1) of the Act, 21 U.S.C. section 360bbb-3(b)(1), unless the authorization is terminated or revoked.  Performed at [facility], [address] [place], [place] [address]   Blood culture (single)     Status: None (Preliminary result)   Collection Time: 05/08/21  8:59 AM   Specimen: BLOOD  Result Value Ref Range Status   Specimen Description BLOOD RIGHT [facility]  Final   Special Requests   Final    BOTTLES DRAWN AEROBIC AND ANAEROBIC Blood Culture results may not be optimal due to an  excessive volume of blood received in culture bottles   Culture   Final    NO GROWTH 4 DAYS Performed at [facility], [address], [place], [place] [address]    Report Status PENDING  Incomplete  MRSA PCR Screening     Status: None   Collection Time: 05/08/21  6:50 PM   Specimen: Nasopharyngeal  Result Value Ref Range Status   MRSA by PCR NEGATIVE NEGATIVE Final    Comment:        The GeneXpert MRSA Assay (FDA approved for NASAL specimens only), is one component of a comprehensive MRSA colonization surveillance program. It is not intended to diagnose MRSA infection nor to guide or monitor treatment for MRSA infections. Performed at [facility], [address] [place], [place] [address]   Culture, Respiratory w Gram Stain     Status: None   Collection Time: 05/09/21 12:16 AM   Specimen: Tracheal Aspirate; Respiratory  Result Value Ref Range Status   Specimen Description   Final    TRACHEAL ASPIRATE Performed at [facility], [address] [place], [place] [address]    Special Requests   Final    NONE Performed at [facility], [place], [place]  [address]    Gram Stain   Final    MODERATE WBC PRESENT,BOTH PMN AND MONONUCLEAR RARE GRAM POSITIVE COCCI IN PAIRS RARE GRAM VARIABLE ROD    Culture   Final    FEW Normal respiratory flora-no Staph aureus or Pseudomonas seen Performed at [facility], [address] [place], [place] [address]    Report Status 05/11/2021 FINAL  Final    Coagulation Studies: No results for input(s): LABPROT, INR in the last 72 hours.  Urinalysis: No results for input(s): COLORURINE, LABSPEC, PHURINE, GLUCOSEU, HGBUR, BILIRUBINUR, KETONESUR, PROTEINUR, UROBILINOGEN, NITRITE, LEUKOCYTESUR in the last 72 hours.  Invalid input(s): APPERANCEUR    Imaging: DG Chest Port 1 View  Result Date: 05/11/2021 CLINICAL DATA:  Line placement. EXAM: PORTABLE CHEST 1 VIEW COMPARISON:   Same day. FINDINGS: Stable cardiomegaly. Endotracheal and nasogastric tubes are unchanged in position. Interval placement right internal jugular catheter with distal tip in expected position of the SVC. No pneumothorax is noted. Mild bibasilar subsegmental atelectasis is noted. Bony thorax is unremarkable. IMPRESSION: Interval placement of right internal jugular catheter with distal tip in expected position of the SVC. No pneumothorax is noted. Electronically Signed   By: Marijo Conception M.D.   On: 05/11/2021 12:39   DG Chest Port 1 View  Result Date: 05/11/2021 CLINICAL DATA:  Acute respiratory failure with hypoxia. EXAM: PORTABLE CHEST 1 VIEW COMPARISON:  May 08, 2021. FINDINGS: Endotracheal tube tip is approximately 2.8 cm above the carina. Enteric tube courses below the diaphragm in outside the field of view. Similar enlarged cardiac silhouette, accentuated by AP technique. Pulmonary vascular congestion. Patchy airspace opacities, slightly increased at the left lung base. Similar possible small left pleural effusion. No visible pneumothorax on this limited semi erect AP radiograph. IMPRESSION: 1. Similar cardiomegaly and pulmonary vascular congestion. Possible small left pleural effusion. 2. Patchy airspace opacities (slightly increased at the left lung base), which could represent edema and/or infection. Electronically Signed   By: Margaretha Sheffield MD   On: 05/11/2021 11:12   ECHOCARDIOGRAM COMPLETE  Result Date: 05/11/2021    ECHOCARDIOGRAM REPORT   Patient Name:   [name] Date of Exam: 05/10/2021 Medical Rec #:  [id number]          Height:       63.0 in Accession #:    [id number]         Weight:       272.5 lb Date of Birth:  21-Mar-1966          BSA:          2.205 m Patient Age:    35 years           BP:           122/64 mmHg Patient Gender: M                  HR:           69 bpm. Exam Location:  [facility] Procedure: 2D Echo, Cardiac Doppler, Color Doppler and Intracardiac             Opacification Agent Indications:     I24.9 Acute Ischemic Heart Disease  History:         Patient has prior history of Echocardiogram examinations, most                  recent 01/29/2021. Risk Factors:Diabetes, Dyslipidemia and  Obesity. Pulmonary hypertension. Pneumonia. Obstructive sleep                  apnea-CPAP. NSVT. Dyspnea. Coronary artery disease. Congestive                  heart failure. Chronic kidney disease.  Sonographer:     Wilford Sports Rodgers-Jones Referring Phys:  [id number] Flora Lipps Diagnosing Phys: Kate Sable MD IMPRESSIONS  1. Left ventricular ejection fraction, by estimation, is 50 to 55%. The left ventricle has low normal function. The left ventricle has no regional wall motion abnormalities. There is mild left ventricular hypertrophy. Left ventricular diastolic parameters were normal.  2. Right ventricular systolic function is normal. The right ventricular size is normal.  3. The mitral valve is normal in structure. No evidence of mitral valve regurgitation.  4. The aortic valve is grossly normal. Aortic valve regurgitation is not visualized. FINDINGS  Left Ventricle: Left ventricular ejection fraction, by estimation, is 50 to 55%. The left ventricle has low normal function. The left ventricle has no regional wall motion abnormalities. Definity contrast agent was given IV to delineate the left ventricular endocardial borders. The left ventricular internal cavity size was normal in size. There is mild left ventricular hypertrophy. Left ventricular diastolic parameters were normal. Right Ventricle: The right ventricular size is normal. No increase in right ventricular wall thickness. Right ventricular systolic function is normal. Left Atrium: Left atrial size was normal in size. Right Atrium: Right atrial size was normal in size. Pericardium: There is no evidence of pericardial effusion. Mitral Valve: The mitral valve is normal in structure. No evidence of mitral valve  regurgitation. Tricuspid Valve: The tricuspid valve is normal in structure. Tricuspid valve regurgitation is not demonstrated. Aortic Valve: The aortic valve is grossly normal. Aortic valve regurgitation is not visualized. Pulmonic Valve: The pulmonic valve was not well visualized. Pulmonic valve regurgitation is not visualized. Aorta: The aortic root is normal in size and structure. Venous: IVC assessment for right atrial pressure unable to be performed due to mechanical ventilation. IAS/Shunts: No atrial level shunt detected by color flow Doppler.  LEFT VENTRICLE PLAX 2D LVIDd:         5.95 cm  Diastology LVIDs:         4.37 cm  LV e' medial:    5.44 cm/s LV PW:         1.20 cm  LV E/e' medial:  15.2 LV IVS:        1.10 cm  LV e' lateral:   7.94 cm/s LVOT diam:     2.40 cm  LV E/e' lateral: 10.4 LV SV:         74 LV SV Index:   34 LVOT Area:     4.52 cm  RIGHT VENTRICLE             IVC RV Basal diam:  4.28 cm     IVC diam: 1.85 cm RV S prime:     14.30 cm/s TAPSE (M-mode): 2.2 cm LEFT ATRIUM             Index       RIGHT ATRIUM           Index LA diam:        4.20 cm 1.90 cm/m  RA Area:     17.80 cm LA Vol (A2C):   64.0 ml 29.02 ml/m RA Volume:   52.80 ml  23.94 ml/m LA Vol (A4C):   48.8 ml 22.13  ml/m LA Biplane Vol: 55.9 ml 25.35 ml/m  AORTIC VALVE LVOT Vmax:   84.15 cm/s LVOT Vmean:  63.700 cm/s LVOT VTI:    0.164 m  AORTA Ao Root diam: 3.70 cm Ao Asc diam:  3.10 cm MITRAL VALVE MV Area (PHT): 2.95 cm    SHUNTS MV Decel Time: 257 msec    Systemic VTI:  0.16 m MV E velocity: 82.50 cm/s  Systemic Diam: 2.40 cm MV A velocity: 85.90 cm/s MV E/A ratio:  0.96 Kate Sable MD Electronically signed by Kate Sable MD Signature Date/Time: 05/11/2021/3:56:19 PM    Final      Medications:   . sodium chloride    . sodium chloride    . sodium chloride    . sodium chloride    . sodium chloride    . ampicillin-sulbactam (UNASYN) IV Stopped (05/11/21 2301)  . famotidine (PEPCID) IV Stopped (05/10/21  2227)  . feeding supplement (VITAL 1.5 CAL) Stopped (05/12/21 8921)  . fentaNYL infusion INTRAVENOUS 200 mcg/hr (05/12/21 0600)  . midazolam 4 mg/hr (05/12/21 0600)   . allopurinol  200 mg Per Tube q AM  . aspirin  81 mg Per Tube Daily  . aspirin  81 mg Oral Pre-Cath  . atorvastatin  40 mg Per Tube q AM  . budesonide (PULMICORT) nebulizer solution  0.5 mg Nebulization BID  . chlorhexidine gluconate (MEDLINE KIT)  15 mL Mouth Rinse BID  . Chlorhexidine Gluconate Cloth  6 each Topical Q0600  . cholecalciferol  1,000 Units Per Tube Daily  . docusate  100 mg Per Tube BID  . feeding supplement (PROSource TF)  90 mL Per Tube TID  . fentaNYL (SUBLIMAZE) injection  50 mcg Intravenous Once  . free water  30 mL Per Tube Q4H  . furosemide  80 mg Per Tube Daily  . heparin  5,000 Units Subcutaneous Q8H  . insulin aspart  0-6 Units Subcutaneous Q4H  . insulin glargine  10 Units Subcutaneous QHS  . ipratropium-albuterol  3 mL Nebulization Q4H  . mouth rinse  15 mL Mouth Rinse 10 times per day  . methylPREDNISolone (SOLU-MEDROL) injection  20 mg Intravenous BID  . multivitamin  1 tablet Per Tube QHS  . pantoprazole (PROTONIX) IV  40 mg Intravenous Q24H  . polyethylene glycol  17 g Per Tube Daily  . sodium chloride flush  3 mL Intravenous Q12H  . sodium chloride flush  3 mL Intravenous Q12H  . triamcinolone cream   Topical BID   sodium chloride, sodium chloride, sodium chloride, sodium chloride, acetaminophen, albuterol, alteplase, cyclobenzaprine, docusate, fentaNYL, heparin, heparin, hydrALAZINE, lidocaine (PF), lidocaine-prilocaine, midazolam, ondansetron (ZOFRAN) IV, pentafluoroprop-tetrafluoroeth, polyethylene glycol, sodium chloride flush, sodium chloride flush  Assessment/ Plan:  [name] is a 55 y.o.  male with past medical history of CHF, GERD, diabetes, hypertension and ESRD requiring HD MWF. He presents to ED with altered mental status and s/p fall.   [facility]/ MWF/ Lt AVF/121kg  1. End Stage Renal Disease with hyperkalemia on hemodialysis:  Initial potassium upon presentation was 7.2.  Potassium remains at 5.1.  Dialysis successful yesterday. Began at low BFR and titrated slowly. Achieved BFR of 275 with UF of 1.5L. Will dialyze today with same technique.    2. Anemia of chronic kidney disease   Lab Results  Component Value Date   HGB 9.3 (L) 05/12/2021   Low dose EPO with treatment Will monitor  3. Secondary Hyperparathyroidism:  Phosphate binders:  Calcimimetics:  Vitamin D agent:  Lab Results  Component Value Date   PTH 369 (H) 06/16/2019   CALCIUM 8.9 05/12/2021   CAION 1.11 (L) 02/24/2020   PHOS 7.6 (H) 05/12/2021  Phosphorus improved to 7.6.  Will monitor.   4. Diabetes mellitus type II with chronic kidney disease Home regimen includes Dulaglutide, glimepiride, and Tresiba. Most recent hemoglobin A1c is 5.6 on 01/28/21.  Primary team monitoring glucose and  SSi  5.  Acute respiratory failure.  Patient intubated.  05/08/2021.  Ventilated   6.  Ventricular fibrillation arrest during dialysis.  Unclear as to what precipitated this.  Electrolytes within acceptable levels. Cardiology consulted.. Will perform Echo to evaluate LV systolic dysfunction. Cardiac cath postponed until patient more stable.   LOS: 4   5/27/20229:21 AM

## 2021-05-12 NOTE — Progress Notes (Signed)
[facility] Cardiology    SUBJECTIVE: The patient is sedated and intubated, unable to answer ROS   Vitals:   05/12/21 0500 05/12/21 0600 05/12/21 0700 05/12/21 0745  BP: 139/80 (!) 152/92 (!) 151/88   Pulse: (!) 59 63 64   Resp: (!) 22 17 20    Temp: 98.96 F (37.2 C) 99.32 F (37.4 C) 99.5 F (37.5 C)   TempSrc:      SpO2: 95% 99% 98% 96%  Weight:      Height:         Intake/Output Summary (Last 24 hours) at 05/12/2021 5361 Last data filed at 05/12/2021 0600 Gross per 24 hour  Intake 3377.53 ml  Output 2025 ml  Net 1352.53 ml      PHYSICAL EXAM  General: Critically ill appearing, lying in bed, intubated, sedated HEENT:  Normocephalic and atramatic Neck:  No JVD.  Lungs: mechanical vent support Heart: HRRR . Normal S1 and S2 without gallops or murmurs.  Msk:  No obvious deformities Extremities: with mild bilateral lower extremity edema.   Neuro: eyes open, some tracking, raising arm up and down    LABS: Basic Metabolic Panel: Recent Labs    05/11/21 0534 05/12/21 0615  NA 138 136  K 5.8* 5.1  CL 100 97*  CO2 23 24  GLUCOSE 236* 247*  BUN 95* 79*  CREATININE 11.42* 8.98*  CALCIUM 8.6* 8.9  MG 2.5* 2.1  PHOS 8.2* 7.6*   Liver Function Tests: No results for input(s): AST, ALT, ALKPHOS, BILITOT, PROT, ALBUMIN in the last 72 hours. No results for input(s): LIPASE, AMYLASE in the last 72 hours. CBC: Recent Labs    05/11/21 0534 05/12/21 0615  WBC 10.7* 12.2*  NEUTROABS 9.9* 10.9*  HGB 9.4* 9.3*  HCT 29.7* 29.0*  MCV 89.2 88.7  PLT 198 211   Cardiac Enzymes: No results for input(s): CKTOTAL, CKMB, CKMBINDEX, TROPONINI in the last 72 hours. BNP: Invalid input(s): POCBNP D-Dimer: No results for input(s): DDIMER in the last 72 hours. Hemoglobin A1C: No results for input(s): HGBA1C in the last 72 hours. Fasting Lipid Panel: No results for input(s): CHOL, HDL, LDLCALC, TRIG, CHOLHDL, LDLDIRECT in the last 72 hours. Thyroid Function Tests: No results for  input(s): TSH, T4TOTAL, T3FREE, THYROIDAB in the last 72 hours.  Invalid input(s): FREET3 Anemia Panel: No results for input(s): VITAMINB12, FOLATE, FERRITIN, TIBC, IRON, RETICCTPCT in the last 72 hours.  DG Chest Port 1 View  Result Date: 05/11/2021 CLINICAL DATA:  Line placement. EXAM: PORTABLE CHEST 1 VIEW COMPARISON:  Same day. FINDINGS: Stable cardiomegaly. Endotracheal and nasogastric tubes are unchanged in position. Interval placement right internal jugular catheter with distal tip in expected position of the SVC. No pneumothorax is noted. Mild bibasilar subsegmental atelectasis is noted. Bony thorax is unremarkable. IMPRESSION: Interval placement of right internal jugular catheter with distal tip in expected position of the SVC. No pneumothorax is noted. Electronically Signed   By: Marijo Conception M.D.   On: 05/11/2021 12:39   DG Chest Port 1 View  Result Date: 05/11/2021 CLINICAL DATA:  Acute respiratory failure with hypoxia. EXAM: PORTABLE CHEST 1 VIEW COMPARISON:  May 08, 2021. FINDINGS: Endotracheal tube tip is approximately 2.8 cm above the carina. Enteric tube courses below the diaphragm in outside the field of view. Similar enlarged cardiac silhouette, accentuated by AP technique. Pulmonary vascular congestion. Patchy airspace opacities, slightly increased at the left lung base. Similar possible small left pleural effusion. No visible pneumothorax on this limited semi erect AP  radiograph. IMPRESSION: 1. Similar cardiomegaly and pulmonary vascular congestion. Possible small left pleural effusion. 2. Patchy airspace opacities (slightly increased at the left lung base), which could represent edema and/or infection. Electronically Signed   By: Margaretha Sheffield MD   On: 05/11/2021 11:12   ECHOCARDIOGRAM COMPLETE  Result Date: 05/11/2021    ECHOCARDIOGRAM REPORT   Patient Name:   Trigger DEONDRA WIGGER Date of Exam: 05/10/2021 Medical Rec #:  [id number]          Height:       63.0 in Accession #:     [id number]         Weight:       272.5 lb Date of Birth:  1966/10/14          BSA:          2.205 m Patient Age:    55 years           BP:           122/64 mmHg Patient Gender: M                  HR:           69 bpm. Exam Location:  [facility] Procedure: 2D Echo, Cardiac Doppler, Color Doppler and Intracardiac            Opacification Agent Indications:     I24.9 Acute Ischemic Heart Disease  History:         Patient has prior history of Echocardiogram examinations, most                  recent 01/29/2021. Risk Factors:Diabetes, Dyslipidemia and                  Obesity. Pulmonary hypertension. Pneumonia. Obstructive sleep                  apnea-CPAP. NSVT. Dyspnea. Coronary artery disease. Congestive                  heart failure. Chronic kidney disease.  Sonographer:     Wilford Sports Rodgers-Jones Referring Phys:  [id number] Flora Lipps Diagnosing Phys: Kate Sable MD IMPRESSIONS  1. Left ventricular ejection fraction, by estimation, is 50 to 55%. The left ventricle has low normal function. The left ventricle has no regional wall motion abnormalities. There is mild left ventricular hypertrophy. Left ventricular diastolic parameters were normal.  2. Right ventricular systolic function is normal. The right ventricular size is normal.  3. The mitral valve is normal in structure. No evidence of mitral valve regurgitation.  4. The aortic valve is grossly normal. Aortic valve regurgitation is not visualized. FINDINGS  Left Ventricle: Left ventricular ejection fraction, by estimation, is 50 to 55%. The left ventricle has low normal function. The left ventricle has no regional wall motion abnormalities. Definity contrast agent was given IV to delineate the left ventricular endocardial borders. The left ventricular internal cavity size was normal in size. There is mild left ventricular hypertrophy. Left ventricular diastolic parameters were normal. Right Ventricle: The right ventricular size is normal. No increase in right  ventricular wall thickness. Right ventricular systolic function is normal. Left Atrium: Left atrial size was normal in size. Right Atrium: Right atrial size was normal in size. Pericardium: There is no evidence of pericardial effusion. Mitral Valve: The mitral valve is normal in structure. No evidence of mitral valve regurgitation. Tricuspid Valve: The tricuspid valve is normal in structure. Tricuspid valve regurgitation is not demonstrated. Aortic  Valve: The aortic valve is grossly normal. Aortic valve regurgitation is not visualized. Pulmonic Valve: The pulmonic valve was not well visualized. Pulmonic valve regurgitation is not visualized. Aorta: The aortic root is normal in size and structure. Venous: IVC assessment for right atrial pressure unable to be performed due to mechanical ventilation. IAS/Shunts: No atrial level shunt detected by color flow Doppler.  LEFT VENTRICLE PLAX 2D LVIDd:         5.95 cm  Diastology LVIDs:         4.37 cm  LV e' medial:    5.44 cm/s LV PW:         1.20 cm  LV E/e' medial:  15.2 LV IVS:        1.10 cm  LV e' lateral:   7.94 cm/s LVOT diam:     2.40 cm  LV E/e' lateral: 10.4 LV SV:         74 LV SV Index:   34 LVOT Area:     4.52 cm  RIGHT VENTRICLE             IVC RV Basal diam:  4.28 cm     IVC diam: 1.85 cm RV S prime:     14.30 cm/s TAPSE (M-mode): 2.2 cm LEFT ATRIUM             Index       RIGHT ATRIUM           Index LA diam:        4.20 cm 1.90 cm/m  RA Area:     17.80 cm LA Vol (A2C):   64.0 ml 29.02 ml/m RA Volume:   52.80 ml  23.94 ml/m LA Vol (A4C):   48.8 ml 22.13 ml/m LA Biplane Vol: 55.9 ml 25.35 ml/m  AORTIC VALVE LVOT Vmax:   84.15 cm/s LVOT Vmean:  63.700 cm/s LVOT VTI:    0.164 m  AORTA Ao Root diam: 3.70 cm Ao Asc diam:  3.10 cm MITRAL VALVE MV Area (PHT): 2.95 cm    SHUNTS MV Decel Time: 257 msec    Systemic VTI:  0.16 m MV E velocity: 82.50 cm/s  Systemic Diam: 2.40 cm MV A velocity: 85.90 cm/s MV E/A ratio:  0.96 Kate Sable MD Electronically  signed by Kate Sable MD Signature Date/Time: 05/11/2021/3:56:19 PM    Final      Echo LVEF 50-55%  TELEMETRY: sinus rhythm, 70s  ASSESSMENT AND PLAN:  Principal Problem:   Hyperkalemia Active Problems:   Essential hypertension   CAD (coronary artery disease)   Chronic gout of multiple sites   ESRD (end stage renal disease) on dialysis (HCC)   Chronic diastolic heart failure (HCC)   Acute on chronic respiratory failure with hypoxia (HCC)   COPD (chronic obstructive pulmonary disease) (HCC)   HLD (hyperlipidemia)   Fall at home, initial encounter   Acute metabolic encephalopathy   Uremia   Elevated troponin   Leukocytosis   Type II diabetes mellitus with renal manifestations ([place])    1. Cardiac arrest, 12 minutes of CPR 2. Coronary artery disease, history of NSTEMI, status post PCI and stent placement of RCA in remote past. Troponin borderline elevated (129- 143- 103- 86- 126- 208) 3. History of ischemic cardiomyopathy, echo shows normal left ventricular function with no regional wall motion abnormalities 4. ESRD on hemodialysis 5. Acute hypoxic respiratory distress, remains intubated 6. Severe encephalopathy  Recommendations: 1. Consider cardiac catheterization when patient stabilizes and is extubated 2. Continue  aspirin, statin, Lasix 3. Defer further cardiac diagnostics at this time  Clabe Seal, Hershal Coria 05/12/2021 8:22 AM

## 2021-05-12 NOTE — NC FL2 (Signed)
[place] LEVEL OF CARE SCREENING TOOL     IDENTIFICATION  Patient Name: [name] Birthdate: Nov 23, 1966 Sex: male Admission Date (Current Location): 05/08/2021  [place] and [place] Number:  Selena Lesser [id number] [place] and Address:  [facility], [address] [place], [place] [address]      Provider Number: [id number]  Attending Physician Name and Address:  Ottie Glazier, MD  Relative Name and Phone Number:  [name] (Daughter)   [phone], Zaydan, Papesh Son     [phone]    Current Level of Care: Hospital Recommended Level of Care: [place] Prior Approval Number:    Date Approved/Denied:   PASRR Number: [id number]  Discharge Plan: SNF    Current Diagnoses: Patient Active Problem List   Diagnosis Date Noted  . Acute respiratory failure with hypoxia ([place]) 05/08/2021  . COPD (chronic obstructive pulmonary disease) ([place])   . HLD (hyperlipidemia)   . Fall at home, initial encounter   . Acute metabolic encephalopathy   . Uremia   . Elevated troponin   . Leukocytosis   . Chronic respiratory failure with hypoxia ([place])   . Type II diabetes mellitus with renal manifestations ([place])   . Hypotension 01/29/2021  . Volume overload 01/29/2021  . Hemodialysis patient ([place]) 06/28/2020  . Complication from renal dialysis device 03/09/2020  . Background diabetic retinopathy associated with type 2 diabetes mellitus ([place]) 10/27/2019  . Hyperkalemia 06/15/2019  . Type 2 diabetes mellitus with hyperglycemia, with long-term current use of insulin ([place]) 03/05/2019  . Hypoglycemia 02/18/2019  . Altered mental status 02/18/2019  . RLQ abdominal mass 01/20/2019  . CHF (congestive heart failure) ([place]) 10/07/2018  . Muscle cramps 10/07/2018  . Shortness of breath 09/08/2018  . Morbid obesity ([place]) 06/24/2018  . Acute on chronic respiratory failure with hypoxia ([place]) 04/26/2018  . Morbid obesity with  BMI of 50.0-59.9, adult ([place]) 04/23/2018  . Chronic diastolic heart failure ([place]) 02/10/2018  . Obstructive sleep apnea 02/10/2018  . Obesity hypoventilation syndrome ([place]) 09/26/2017  . Diabetes mellitus without complication ([place]) 62/22/9798  . CAD (coronary artery disease) 01/31/2017  . Hyperlipidemia, unspecified 01/31/2017  . Essential hypertension 01/17/2017  . Psoriasis 01/17/2017  . Chronic gout of multiple sites 12/21/2016  . DM type 2 with diabetic peripheral neuropathy ([place]) 06/25/2016  . Cardiomyopathy ([place]) 08/19/2012  . ESRD (end stage renal disease) on dialysis ([place]) 08/19/2012  . Hyperlipidemia associated with type 2 diabetes mellitus ([place]) 12/17/1998    Orientation RESPIRATION BLADDER Height & Weight     Self,Time,Place  Normal Continent Weight: 268 lb 15.4 oz (122 kg) Height:  _0  (160 cm)  BEHAVIORAL SYMPTOMS/MOOD NEUROLOGICAL BOWEL NUTRITION STATUS      Continent Diet  AMBULATORY STATUS COMMUNICATION OF NEEDS Skin   Limited Assist   Normal                       Personal Care Assistance Level of Assistance  Bathing,Feeding,Dressing,Total care Bathing Assistance: Limited assistance Feeding assistance: Limited assistance Dressing Assistance: Limited assistance Total Care Assistance: Limited assistance   Functional Limitations Info  Sight,Hearing,Speech Sight Info: Adequate Hearing Info: Adequate      SPECIAL CARE FACTORS FREQUENCY  PT (By licensed PT),OT (By licensed OT)     PT Frequency: 5X per week OT Frequency: 5X per week            Contractures Contractures Info: Not present    Additional Factors Info  Current Medications (05/12/2021):  This is the current hospital active medication list Current Facility-Administered Medications  Medication Dose Route Frequency Provider Last Rate Last Admin  . 0.9 %  sodium chloride infusion  100 mL Intravenous PRN Breeze, Shantelle, NP      . 0.9 %  sodium chloride infusion   100 mL Intravenous PRN Breeze, Shantelle, NP      . 0.9 %  sodium chloride infusion  250 mL Intravenous PRN Mortimer Fries, Kurian, MD      . 0.9 %  sodium chloride infusion  250 mL Intravenous PRN Corey Skains, MD      . 0.9% sodium chloride infusion  1 mL/kg/hr Intravenous Continuous Corey Skains, MD      . acetaminophen (TYLENOL) tablet 650 mg  650 mg Per Tube Q4H PRN Flora Lipps, MD      . albuterol (PROVENTIL) (2.5 MG/3ML) 0.083% nebulizer solution 2.5 mg  2.5 mg Nebulization Q4H PRN Flora Lipps, MD      . allopurinol (ZYLOPRIM) tablet 200 mg  200 mg Per Tube q AM Flora Lipps, MD   200 mg at 05/12/21 0900  . alteplase (CATHFLO ACTIVASE) injection 2 mg  2 mg Intracatheter Once PRN Colon Flattery, NP      . Ampicillin-Sulbactam (UNASYN) 3 g in sodium chloride 0.9 % 100 mL IVPB  3 g Intravenous Q24H Ottie Glazier, MD   Stopped at 05/11/21 2301  . aspirin chewable tablet 81 mg  81 mg Per Tube Daily Flora Lipps, MD   81 mg at 05/12/21 0913  . aspirin chewable tablet 81 mg  81 mg Oral Bradly Bienenstock, MD      . atorvastatin (LIPITOR) tablet 40 mg  40 mg Per Tube q AM Rust-Chester, Toribio Harbour L, NP   40 mg at 05/12/21 0900  . budesonide (PULMICORT) nebulizer solution 0.5 mg  0.5 mg Nebulization BID Flora Lipps, MD   0.5 mg at 05/12/21 0744  . chlorhexidine gluconate (MEDLINE KIT) (PERIDEX) 0.12 % solution 15 mL  15 mL Mouth Rinse BID Flora Lipps, MD   15 mL at 05/12/21 0800  . Chlorhexidine Gluconate Cloth 2 % PADS 6 each  6 each Topical Q0600 Colon Flattery, NP   6 each at 05/12/21 0558  . cholecalciferol (D-VI-SOL) 10 MCG/ML oral liquid 1,000 Units  1,000 Units Per Tube Daily Benita Gutter, RPH   1,000 Units at 05/12/21 2956  . cyclobenzaprine (FLEXERIL) tablet 5 mg  5 mg Per Tube BID PRN Flora Lipps, MD      . docusate (COLACE) 50 MG/5ML liquid 100 mg  100 mg Per Tube BID Flora Lipps, MD   100 mg at 05/12/21 0913  . docusate (COLACE) 50 MG/5ML liquid 100 mg  100 mg Per  Tube BID PRN Benita Gutter, RPH      . famotidine (PEPCID) IVPB 20 mg premix  20 mg Intravenous Q48H Flora Lipps, MD   Paused at 05/10/21 2227  . feeding supplement (PROSource TF) liquid 90 mL  90 mL Per Tube TID Flora Lipps, MD   90 mL at 05/12/21 0913  . feeding supplement (VITAL 1.5 CAL) liquid 1,000 mL  1,000 mL Per Tube Continuous Flora Lipps, MD   Stopped at 05/12/21 [phone]  . fentaNYL (SUBLIMAZE) bolus via infusion 50-100 mcg  50-100 mcg Intravenous Q15 min PRN Flora Lipps, MD   100 mcg at 05/12/21 1159  . fentaNYL (SUBLIMAZE) injection 50 mcg  50 mcg Intravenous Once Flora Lipps, MD      .  fentaNYL 2536mg in NS 2543m(1065mml) infusion-PREMIX  50-200 mcg/hr Intravenous Continuous KasFlora LippsD 20 mL/hr at 05/12/21 0923 200 mcg/hr at 05/12/21 0923  . free water 30 mL  30 mL Per Tube Q4H KasFlora LippsD   30 mL at 05/12/21 1029  . furosemide (LASIX) tablet 80 mg  80 mg Per Tube Daily Rust-Chester, Britton L, NP   80 mg at 05/12/21 0913  . heparin injection 1,000 Units  1,000 Units Dialysis PRN BreColon FlatteryP   1,000 Units at 05/09/21 1955  . heparin injection 1,000-6,000 Units  1,000-6,000 Units Dialysis PRN KeeBradly BienenstockP   3,000 Units at 05/11/21 1310  . heparin injection 5,000 Units  5,000 Units Subcutaneous Q8HCleophas DunkerD   5,000 Units at 05/12/21 [id number] hydrALAZINE (APRESOLINE) injection 5 mg  5 mg Intravenous Q2H PRN NiuIvor CostaD      . insulin aspart (novoLOG) injection 0-9 Units  0-9 Units Subcutaneous Q4H AleOttie GlazierD   3 Units at 05/12/21 1154  . insulin glargine (LANTUS) injection 10 Units  10 Units Subcutaneous QHS NiuIvor CostaD   10 Units at 05/11/21 2139  . ipratropium-albuterol (DUONEB) 0.5-2.5 (3) MG/3ML nebulizer solution 3 mL  3 mL Nebulization Q6H Aleskerov, Fuad, MD      . lidocaine (PF) (XYLOCAINE) 1 % injection 5 mL  5 mL Intradermal PRN Breeze, Shantelle, NP      . lidocaine-prilocaine (EMLA) cream 1 application  1 application  Topical PRN Breeze, Shantelle, NP      . MEDLINE mouth rinse  15 mL Mouth Rinse 10 times per day KasFlora LippsD   15 mL at 05/12/21 1154  . methylPREDNISolone sodium succinate (SOLU-MEDROL) 40 mg/mL injection 20 mg  20 mg Intravenous BID KasFlora LippsD   20 mg at 05/12/21 0914  . midazolam (VERSED) 50 mg/50 mL (1 mg/mL) premix infusion  0.5-4 mg/hr Intravenous Continuous BlaAwilda BillP 4 mL/hr at 05/12/21 0600 4 mg/hr at 05/12/21 0600  . midazolam (VERSED) injection 2 mg  2 mg Intravenous Q2H PRN KasFlora LippsD   2 mg at 05/12/21 0130  . multivitamin (RENA-VIT) tablet 1 tablet  1 tablet Per Tube QHS KasFlora LippsD   1 tablet at 05/11/21 2140  . ondansetron (ZOFRAN) injection 4 mg  4 mg Intravenous Q6H PRN KasFlora LippsD      . pantoprazole (PROTONIX) injection 40 mg  40 mg Intravenous Q24H ChaLockie Mola RPH   40 mg at 05/12/21 0914  . pentafluoroprop-tetrafluoroeth (GEBAUERS) aerosol 1 application  1 application Topical PRN Breeze, Shantelle, NP      . polyethylene glycol (MIRALAX / GLYCOLAX) packet 17 g  17 g Per Tube Daily PRN KasFlora LippsD      . polyethylene glycol (MIRALAX / GLYCOLAX) packet 17 g  17 g Per Tube Daily KasFlora LippsD   17 g at 05/12/21 0914  . sodium chloride flush (NS) 0.9 % injection 3 mL  3 mL Intravenous Q12H KasFlora LippsD   3 mL at 05/12/21 0909  . sodium chloride flush (NS) 0.9 % injection 3 mL  3 mL Intravenous PRN KasFlora LippsD      . sodium chloride flush (NS) 0.9 % injection 3 mL  3 mL Intravenous Q12H KowCorey SkainsD   3 mL at 05/12/21 0909  . sodium chloride flush (NS) 0.9 % injection 3 mL  3 mL Intravenous PRN KowCorey SkainsD      .  triamcinolone cream (KENALOG) 0.1 % cream   Topical BID Flora Lipps, MD   Given at 05/12/21 4830     Discharge Medications: Please see discharge summary for a list of discharge medications.  Relevant Imaging Results:  Relevant Lab Results:   Additional Information SS#  [id number]  Adelene Amas, LCSWA

## 2021-05-13 LAB — CBC WITH DIFFERENTIAL/PLATELET
Abs Immature Granulocytes: 0.08 10*3/uL — ABNORMAL HIGH (ref 0.00–0.07)
Basophils Absolute: 0 10*3/uL (ref 0.0–0.1)
Basophils Relative: 0 %
Eosinophils Absolute: 0 10*3/uL (ref 0.0–0.5)
Eosinophils Relative: 0 %
HCT: 31.6 % — ABNORMAL LOW (ref 39.0–52.0)
Hemoglobin: 9.9 g/dL — ABNORMAL LOW (ref 13.0–17.0)
Immature Granulocytes: 1 %
Lymphocytes Relative: 6 %
Lymphs Abs: 0.7 10*3/uL (ref 0.7–4.0)
MCH: 28.5 pg (ref 26.0–34.0)
MCHC: 31.3 g/dL (ref 30.0–36.0)
MCV: 91.1 fL (ref 80.0–100.0)
Monocytes Absolute: 0.5 10*3/uL (ref 0.1–1.0)
Monocytes Relative: 4 %
Neutro Abs: 11.6 10*3/uL — ABNORMAL HIGH (ref 1.7–7.7)
Neutrophils Relative %: 89 %
Platelets: 208 10*3/uL (ref 150–400)
RBC: 3.47 MIL/uL — ABNORMAL LOW (ref 4.22–5.81)
RDW: 14.6 % (ref 11.5–15.5)
WBC: 12.9 10*3/uL — ABNORMAL HIGH (ref 4.0–10.5)
nRBC: 0.2 % (ref 0.0–0.2)

## 2021-05-13 LAB — CULTURE, BLOOD (SINGLE): Culture: NO GROWTH

## 2021-05-13 LAB — BASIC METABOLIC PANEL
Anion gap: 12 (ref 5–15)
BUN: 79 mg/dL — ABNORMAL HIGH (ref 6–20)
CO2: 27 mmol/L (ref 22–32)
Calcium: 9 mg/dL (ref 8.9–10.3)
Chloride: 99 mmol/L (ref 98–111)
Creatinine, Ser: 8.38 mg/dL — ABNORMAL HIGH (ref 0.61–1.24)
GFR, Estimated: 7 mL/min — ABNORMAL LOW (ref >60)
Glucose, Bld: 205 mg/dL — ABNORMAL HIGH (ref 70–99)
Potassium: 5.5 mmol/L — ABNORMAL HIGH (ref 3.5–5.1)
Sodium: 138 mmol/L (ref 135–145)

## 2021-05-13 LAB — GLUCOSE, CAPILLARY
Glucose-Capillary: 192 mg/dL — ABNORMAL HIGH (ref 70–99)
Glucose-Capillary: 235 mg/dL — ABNORMAL HIGH (ref 70–99)
Glucose-Capillary: 154 mg/dL — ABNORMAL HIGH (ref 70–99)
Glucose-Capillary: 198 mg/dL — ABNORMAL HIGH (ref 70–99)
Glucose-Capillary: 202 mg/dL — ABNORMAL HIGH (ref 70–99)
Glucose-Capillary: 177 mg/dL — ABNORMAL HIGH (ref 70–99)

## 2021-05-13 LAB — MAGNESIUM: Magnesium: 2 mg/dL (ref 1.7–2.4)

## 2021-05-13 LAB — PHOSPHORUS: Phosphorus: 9.3 mg/dL — ABNORMAL HIGH (ref 2.5–4.6)

## 2021-05-13 MED ORDER — INSULIN ASPART 100 UNIT/ML IJ SOLN
0.0000 [IU] | INTRAMUSCULAR | Status: DC
  Administered 2021-05-13: 5 [IU] via SUBCUTANEOUS
  Administered 2021-05-14 (×2): 3 [IU] via SUBCUTANEOUS
  Administered 2021-05-14 (×2): 5 [IU] via SUBCUTANEOUS
  Administered 2021-05-14 (×2): 3 [IU] via SUBCUTANEOUS
  Administered 2021-05-15: 2 [IU] via SUBCUTANEOUS
  Administered 2021-05-15: 3 [IU] via SUBCUTANEOUS
  Administered 2021-05-15 (×2): 2 [IU] via SUBCUTANEOUS
  Administered 2021-05-15: 5 [IU] via SUBCUTANEOUS
  Administered 2021-05-16: 3 [IU] via SUBCUTANEOUS
  Administered 2021-05-16: 5 [IU] via SUBCUTANEOUS
  Administered 2021-05-16: 3 [IU] via SUBCUTANEOUS
  Administered 2021-05-16: 8 [IU] via SUBCUTANEOUS
  Administered 2021-05-16: 3 [IU] via SUBCUTANEOUS
  Administered 2021-05-16: 8 [IU] via SUBCUTANEOUS
  Administered 2021-05-17: 11 [IU] via SUBCUTANEOUS
  Administered 2021-05-17: 15 [IU] via SUBCUTANEOUS
  Administered 2021-05-17: 2 [IU] via SUBCUTANEOUS
  Administered 2021-05-18: 8 [IU] via SUBCUTANEOUS
  Administered 2021-05-18: 11 [IU] via SUBCUTANEOUS
  Administered 2021-05-18 (×3): 3 [IU] via SUBCUTANEOUS
  Administered 2021-05-18: 5 [IU] via SUBCUTANEOUS
  Administered 2021-05-19: 2 [IU] via SUBCUTANEOUS
  Administered 2021-05-19: 11 [IU] via SUBCUTANEOUS
  Administered 2021-05-19: 2 [IU] via SUBCUTANEOUS
  Administered 2021-05-19: 8 [IU] via SUBCUTANEOUS
  Filled 2021-05-13 (×30): qty 1

## 2021-05-13 MED ORDER — MIDAZOLAM BOLUS VIA INFUSION
2.0000 mg | INTRAVENOUS | Status: DC | PRN
  Filled 2021-05-13: qty 2

## 2021-05-13 MED ORDER — NOREPINEPHRINE 4 MG/250ML-% IV SOLN
INTRAVENOUS | Status: AC
  Filled 2021-05-13: qty 250

## 2021-05-13 MED ORDER — HEPARIN SODIUM (PORCINE) 1000 UNIT/ML IJ SOLN
2000.0000 [IU] | Freq: Once | INTRAMUSCULAR | Status: AC
  Administered 2021-05-13: 2000 [IU] via INTRAVENOUS

## 2021-05-13 MED ORDER — MIDAZOLAM BOLUS VIA INFUSION
2.0000 mg | Freq: Once | INTRAVENOUS | Status: AC
  Administered 2021-05-13: 2 mg via INTRAVENOUS
  Filled 2021-05-13: qty 2

## 2021-05-13 NOTE — Progress Notes (Signed)
Sputum collected and delivered to lab ?

## 2021-05-13 NOTE — Progress Notes (Signed)
NAME:  DEMONTAE ANTUNES, MRN:  [id number], DOB:  Oct 28, 1966, LOS: 5 ADMISSION DATE:  05/08/2021  55 y.o.malewith a history ofdCHF,ESRD on hemodialysis COPD,diabetes morbid obesity who presents to ER with severealtered mental status. Had a fall hitting the left side of his forehead yesterday.   Went to dialysis this morning where he was noted to be somnolent, poorly interactive, not able to follow commands. +headache.    Pt unable to complete dialysis today, per EMS pt had fall yesterday, at dialysis patient kept falling asleep, unable to determine baseline mentation due to being a new dialysis site. Pt with bruising and swelling present to left eye. Pt also complains of back pain and a headache.  Patient subsequently developed acute SOB and hypoxia failed biPAP and emergently intubated   05/12/21- patient is weaned on ventilator to Fio2 45%.  Holding OGF for SBT today.  He is awake RASS-0 during my evaluation. Labs reviewed with mild trend up of wbc hb stable 9.3. POC glucose 216. On unasyn day 5, blood cultures negative.   05/13/21- patient received HD.  He was unable to be weaned today. Plan is for SBT as soon as possible for liberation from MV>    Antibiotics Given (last 72 hours)    Date/Time Action Medication Dose Rate   05/10/21 2328 New Bag/Given   Ampicillin-Sulbactam (UNASYN) 3 g in sodium chloride 0.9 % 100 mL IVPB 3 g 200 mL/hr   05/11/21 2229 New Bag/Given   Ampicillin-Sulbactam (UNASYN) 3 g in sodium chloride 0.9 % 100 mL IVPB 3 g 200 mL/hr   05/12/21 2220 New Bag/Given   Ampicillin-Sulbactam (UNASYN) 3 g in sodium chloride 0.9 % 100 mL IVPB 3 g 200 mL/hr           Objective   Blood pressure (!) 149/92, pulse 80, temperature 98.4 F (36.9 C), resp. rate 18, height 5' 2.99" (1.6 m), weight 122 kg, SpO2 100 %.    Vent Mode: PRVC FiO2 (%):  [45 %-55 %] 50 % Set Rate:  [18 bmp] 18 bmp Vt Set:  [450 mL] 450 mL PEEP:  [5 cmH20] 5 cmH20 Plateau  Pressure:  [22 cmH20-23 cmH20] 22 cmH20   Intake/Output Summary (Last 24 hours) at 05/13/2021 1534 Last data filed at 05/13/2021 1445 Gross per 24 hour  Intake 1356.41 ml  Output 4752 ml  Net -3395.59 ml   Filed Weights   05/09/21 0439 05/10/21 0436 05/11/21 0448  Weight: 123.8 kg 123.6 kg 122 kg      REVIEW OF SYSTEMS  PATIENT IS UNABLE TO PROVIDE COMPLETE REVIEW OF SYSTEMS DUE TO SEVERE CRITICAL ILLNESS AND TOXIC METABOLIC ENCEPHALOPATHY   PHYSICAL EXAMINATION:  GENERAL:critically ill appearing, +resp distress HEAD: Normocephalic, atraumatic.  EYES: Pupils equal, round, reactive to light.  No scleral icterus.  MOUTH: Moist mucosal membrane. NECK: Supple. PULMONARY: +rhonchi, +wheezing CARDIOVASCULAR: S1 and S2. Regular rate and rhythm. No murmurs, rubs, or gallops.  GASTROINTESTINAL: Soft, nontender, -distended. Positive bowel sounds.  MUSCULOSKELETAL: No swelling, clubbing, or edema.  NEUROLOGIC: obtunded SKIN:intact,warm,dry   Labs/imaging that I havepersonally reviewed  (right click and "Reselect all SmartList Selections" daily)       ASSESSMENT AND PLAN SYNOPSIS  55 yo morbidly obese AAM with severe acute hypoxic respiratory failure post HD with aspiration pna    Severe ACUTE Hypoxic and Hypercapnic Respiratory Failure -continue Mechanical Ventilator support -continue Bronchodilator Therapy -Wean Fio2 and PEEP as tolerated -VAP/VENT bundle implementation Vent Mode: PRVC FiO2 (%):  [45 %-55 %] 50 %  Set Rate:  [18 bmp] 18 bmp Vt Set:  [450 mL] 450 mL PEEP:  [5 cmH20] 5 cmH20 Plateau Pressure:  [22 cmH20-23 cmH20] 22 cmH20   SEVERE COPD EXACERBATION -continue IV steroids as prescribed -continue NEB THERAPY as prescribed   CARDIAC FAILURE-cardiac arrest +ischemic cardiomyopathy ECHO pending Cardiology consulted May consider [place] ICU monitoring   ESRD ON HD -continue Foley Catheter-assess need -Avoid nephrotoxic agents -Follow  urine output, BMP -Ensure adequate renal perfusion, optimize oxygenation -Renal dose medications HD as needed tolerated  NEUROLOGY Acute toxic metabolic encephalopathy, need for sedation Goal RASS -2 to -3   INFECTIOUS DISEASE Therapy for aspiration pneumonia RARE GRAM POSITIVE COCCI IN PAIRS  RARE GRAM VARIABLE ROD  -continue antibiotics as prescribed -follow up cultures   ENDO - ICU hypoglycemic\Hyperglycemia protocol -check FSBS per protocol   GI GI PROPHYLAXIS as indicated  NUTRITIONAL STATUS DIET-->TF's as tolerated Constipation protocol as indicated   ELECTROLYTES -follow labs as needed -replace as needed -pharmacy consultation and following     Best practice (right click and "Reselect all SmartList Selections" daily)  Diet:NPO Pain/Anxiety/Delirium protocol (if indicated):Yes(RASS goal -2) VAP protocol (if indicated):Yes DVT prophylaxis:Subcutaneous Heparin GI prophylaxis:H2B Glucose control:SSIYes Central venous access:N/A Arterial line:N/A Foley:N/A Mobility:bed rest PT consulted:N/A Code Status:full code Disposition:ICU      Labs   CBC: Recent Labs  Lab 05/09/21 2233 05/10/21 0537 05/11/21 0534 05/12/21 0615 05/13/21 0418  WBC 15.0* 13.3* 10.7* 12.2* 12.9*  NEUTROABS 11.3* 10.7* 9.9* 10.9* 11.6*  HGB 11.4* 10.1* 9.4* 9.3* 9.9*  HCT 36.0* 31.2* 29.7* 29.0* 31.6*  MCV 90.7 89.1 89.2 88.7 91.1  PLT 211 208 198 211 932    Basic Metabolic Panel: Recent Labs  Lab 05/09/21 2233 05/10/21 0537 05/11/21 0534 05/12/21 0615 05/13/21 0418  NA 137 138 138 136 138  K 4.0 4.3 5.8* 5.1 5.5*  CL 98 99 100 97* 99  CO2 22 24 23 24 27   GLUCOSE 101* 131* 236* 247* 205*  BUN 55* 64* 95* 79* 79*  CREATININE 8.96* 10.45* 11.42* 8.98* 8.38*  CALCIUM 8.5* 8.5* 8.6* 8.9 9.0  MG 1.5* 2.5* 2.5* 2.1 2.0  PHOS 5.7* 7.7* 8.2* 7.6* 9.3*   GFR: Estimated Creatinine Clearance: 12.1 mL/min (A) (by C-G formula based on SCr of  8.38 mg/dL (H)). Recent Labs  Lab 05/10/21 0537 05/11/21 0534 05/12/21 0615 05/13/21 0418  WBC 13.3* 10.7* 12.2* 12.9*    Liver Function Tests: Recent Labs  Lab 05/08/21 0859  AST 13*  ALT 16  ALKPHOS 145*  BILITOT 0.9  PROT 8.6*  ALBUMIN 4.3   No results for input(s): LIPASE, AMYLASE in the last 168 hours. Recent Labs  Lab 05/08/21 0859  AMMONIA 25    ABG    Component Value Date/Time   PHART 7.31 (L) 05/09/2021 0434   PCO2ART 50 (H) 05/09/2021 0434   PO2ART 124 (H) 05/09/2021 0434   HCO3 25.2 05/09/2021 0434   TCO2 30 02/24/2020 0635   ACIDBASEDEF 1.4 05/09/2021 0434   O2SAT 98.5 05/09/2021 0434     Coagulation Profile: No results for input(s): INR, PROTIME in the last 168 hours.  Cardiac Enzymes: No results for input(s): CKTOTAL, CKMB, CKMBINDEX, TROPONINI in the last 168 hours.  HbA1C: Hgb A1c MFr Bld  Date/Time Value Ref Range Status  05/09/2021 04:53 AM 7.1 (H) 4.8 - 5.6 % Final    Comment:    (NOTE)         Prediabetes: 5.7 - 6.4  Diabetes: >6.4         Glycemic control for adults with diabetes: <7.0   01/28/2021 01:24 AM 5.6 4.8 - 5.6 % Final    Comment:    (NOTE) Pre diabetes:          5.7%-6.4%  Diabetes:              >6.4%  Glycemic control for   <7.0% adults with diabetes     CBG: Recent Labs  Lab 05/12/21 2326 05/13/21 0338 05/13/21 0732 05/13/21 1127 05/13/21 1531  GLUCAP 182* 192* 235* 154* 198*    Allergies Allergies  Allergen Reactions  . Shellfish Allergy Anaphylaxis    Face and throat swelling, difficulty breathing Allergy can be triggered by touching (contact)  . Other   . Betadine [Povidone Iodine] Rash  . Povidone-Iodine Rash       DVT/GI PRX  assessed I Assessed the need for Labs I Assessed the need for Foley I Assessed the need for Central Venous Line Family Discussion when available I Assessed the need for Mobilization I made an Assessment of medications to be adjusted accordingly Safety  Risk assessment completed  [place] ICU TEAM    Critical care provider statement:    Critical care time (minutes):  33   Critical care time was exclusive of:  Separately billable procedures and  treating other patients   Critical care was necessary to treat or prevent imminent or  life-threatening deterioration of the following conditions:  acute hypoxemic respiratory failure, severe copd exacerbation , acute CHF , obesity, multiple comorbid conditions   Critical care was time spent personally by me on the following  activities:  Development of treatment plan with patient or surrogate,  discussions with consultants, evaluation of patient's response to  treatment, examination of patient, obtaining history from patient or  surrogate, ordering and performing treatments and interventions, ordering  and review of laboratory studies and re-evaluation of patient's condition   I assumed direction of critical care for this patient from another  provider in my specialty: no      Ottie Glazier, M.D.  [facility] [place]

## 2021-05-13 NOTE — Progress Notes (Signed)
[facility]  PROGRESS NOTE   Subjective:   Patient seen in ICU.  Intubated, awake. Labs reviewed.  Objective:  Vital signs in last 24 hours:  Temp:  [98.42 F (36.9 C)-99.1 F (37.3 C)] 98.96 F (37.2 C) (05/28 1300) Pulse Rate:  [62-124] 73 (05/28 1330) Resp:  [14-25] 17 (05/28 1330) BP: (114-161)/(69-141) 143/94 (05/28 1330) SpO2:  [86 %-99 %] 95 % (05/28 1330) FiO2 (%):  [45 %-55 %] 50 % (05/28 1325)  Weight change:  Filed Weights   05/09/21 0439 05/10/21 0436 05/11/21 0448  Weight: 123.8 kg 123.6 kg 122 kg    Intake/Output: I/O last 3 completed shifts: In: 2242.7 [I.V.:898.1; NG/GT:1194.8; IV Piggyback:149.9] Out: 0086 [Urine:500; Other:2602]   Intake/Output this shift:  Total I/O In: -  Out: 150 [Urine:150]  Physical Exam: General:  No acute distress  Head:  Normocephalic, atraumatic. Moist oral mucosal membranes  Eyes:  Anicteric  Neck:  Supple  Lungs:   Clear to auscultation, normal effort  Heart:  S1S2 no rubs  Abdomen:   Soft, nontender, bowel sounds present  Extremities:  peripheral edema.  Neurologic:  Awake, alert, following commands  Skin:  No lesions  Access:     Basic Metabolic Panel: Recent Labs  Lab 05/09/21 2233 05/10/21 0537 05/11/21 0534 05/12/21 0615 05/13/21 0418  NA 137 138 138 136 138  K 4.0 4.3 5.8* 5.1 5.5*  CL 98 99 100 97* 99  CO2 22 24 23 24 27   GLUCOSE 101* 131* 236* 247* 205*  BUN 55* 64* 95* 79* 79*  CREATININE 8.96* 10.45* 11.42* 8.98* 8.38*  CALCIUM 8.5* 8.5* 8.6* 8.9 9.0  MG 1.5* 2.5* 2.5* 2.1 2.0  PHOS 5.7* 7.7* 8.2* 7.6* 9.3*    Liver Function Tests: Recent Labs  Lab 05/08/21 0859  AST 13*  ALT 16  ALKPHOS 145*  BILITOT 0.9  PROT 8.6*  ALBUMIN 4.3   No results for input(s): LIPASE, AMYLASE in the last 168 hours. Recent Labs  Lab 05/08/21 0859  AMMONIA 25    CBC: Recent Labs  Lab 05/09/21 2233 05/10/21 0537 05/11/21 0534 05/12/21 0615 05/13/21 0418  WBC 15.0* 13.3* 10.7*  12.2* 12.9*  NEUTROABS 11.3* 10.7* 9.9* 10.9* 11.6*  HGB 11.4* 10.1* 9.4* 9.3* 9.9*  HCT 36.0* 31.2* 29.7* 29.0* 31.6*  MCV 90.7 89.1 89.2 88.7 91.1  PLT 211 208 198 211 208    Cardiac Enzymes: No results for input(s): CKTOTAL, CKMB, CKMBINDEX, TROPONINI in the last 168 hours.  BNP: Invalid input(s): POCBNP  CBG: Recent Labs  Lab 05/12/21 1920 05/12/21 2326 05/13/21 0338 05/13/21 0732 05/13/21 1127  GLUCAP 211* 182* 192* 235* 154*    Microbiology: Results for orders placed or performed during the hospital encounter of 05/08/21  Resp Panel by RT-PCR (Flu A&B, Covid) Nasopharyngeal Swab     Status: None   Collection Time: 05/08/21  8:04 AM   Specimen: Nasopharyngeal Swab; Nasopharyngeal(NP) swabs in vial transport medium  Result Value Ref Range Status   SARS Coronavirus 2 by RT PCR NEGATIVE NEGATIVE Final    Comment: (NOTE) SARS-CoV-2 target nucleic acids are NOT DETECTED.  The SARS-CoV-2 RNA is generally detectable in upper respiratory specimens during the acute phase of infection. The lowest concentration of SARS-CoV-2 viral copies this assay can detect is 138 copies/mL. A negative result does not preclude SARS-Cov-2 infection and should not be used as the sole basis for treatment or other patient management decisions. A negative result may occur with  improper specimen collection/handling,  submission of specimen other than nasopharyngeal swab, presence of viral mutation(s) within the areas targeted by this assay, and inadequate number of viral copies(<138 copies/mL). A negative result must be combined with clinical observations, patient history, and epidemiological information. The expected result is Negative.  Fact Sheet for Patients:  EntrepreneurPulse.com.au  Fact Sheet for Healthcare Providers:  IncredibleEmployment.be  This test is no t yet approved or cleared by the [place] FDA and  has been authorized for  detection and/or diagnosis of SARS-CoV-2 by FDA under an Emergency Use Authorization (EUA). This EUA will remain  in effect (meaning this test can be used) for the duration of the COVID-19 declaration under Section 564(b)(1) of the Act, 21 U.S.C.section 360bbb-3(b)(1), unless the authorization is terminated  or revoked sooner.       Influenza A by PCR NEGATIVE NEGATIVE Final   Influenza B by PCR NEGATIVE NEGATIVE Final    Comment: (NOTE) The Xpert Xpress SARS-CoV-2/FLU/RSV plus assay is intended as an aid in the diagnosis of influenza from Nasopharyngeal swab specimens and should not be used as a sole basis for treatment. Nasal washings and aspirates are unacceptable for Xpert Xpress SARS-CoV-2/FLU/RSV testing.  Fact Sheet for Patients: EntrepreneurPulse.com.au  Fact Sheet for Healthcare Providers: IncredibleEmployment.be  This test is not yet approved or cleared by the [place] FDA and has been authorized for detection and/or diagnosis of SARS-CoV-2 by FDA under an Emergency Use Authorization (EUA). This EUA will remain in effect (meaning this test can be used) for the duration of the COVID-19 declaration under Section 564(b)(1) of the Act, 21 U.S.C. section 360bbb-3(b)(1), unless the authorization is terminated or revoked.  Performed at [facility], [address] [place], [place] [address]   Blood culture (single)     Status: None   Collection Time: 05/08/21  8:59 AM   Specimen: BLOOD  Result Value Ref Range Status   Specimen Description BLOOD RIGHT [facility]  Final   Special Requests   Final    BOTTLES DRAWN AEROBIC AND ANAEROBIC Blood Culture results may not be optimal due to an excessive volume of blood received in culture bottles   Culture   Final    NO GROWTH 5 DAYS Performed at [facility], [address] [place], [place] [address]    Report Status 05/13/2021 FINAL  Final  MRSA PCR Screening     Status:  None   Collection Time: 05/08/21  6:50 PM   Specimen: Nasopharyngeal  Result Value Ref Range Status   MRSA by PCR NEGATIVE NEGATIVE Final    Comment:        The GeneXpert MRSA Assay (FDA approved for NASAL specimens only), is one component of a comprehensive MRSA colonization surveillance program. It is not intended to diagnose MRSA infection nor to guide or monitor treatment for MRSA infections. Performed at [facility], [address] [place], [place] [address]   Culture, Respiratory w Gram Stain     Status: None   Collection Time: 05/09/21 12:16 AM   Specimen: Tracheal Aspirate; Respiratory  Result Value Ref Range Status   Specimen Description   Final    TRACHEAL ASPIRATE Performed at [facility], [address] [place], [place] [address]    Special Requests   Final    NONE Performed at [facility], [place], [place] [address]    Gram Stain   Final    MODERATE WBC PRESENT,BOTH PMN AND MONONUCLEAR RARE GRAM POSITIVE COCCI IN PAIRS  RARE GRAM VARIABLE ROD    Culture   Final    FEW Normal respiratory flora-no Staph aureus or Pseudomonas seen Performed at [facility], [place] [address], [place], [place] [address]    Report Status 05/11/2021 FINAL  Final    Coagulation Studies: No results for input(s): LABPROT, INR in the last 72 hours.  Urinalysis: No results for input(s): COLORURINE, LABSPEC, PHURINE, GLUCOSEU, HGBUR, BILIRUBINUR, KETONESUR, PROTEINUR, UROBILINOGEN, NITRITE, LEUKOCYTESUR in the last 72 hours.  Invalid input(s): APPERANCEUR    Imaging: No results found.   Medications:   . sodium chloride    . sodium chloride    . sodium chloride    . sodium chloride    . sodium chloride    . famotidine (PEPCID) IV Stopped (05/12/21 2159)  . feeding supplement (VITAL 1.5 CAL) Stopped (05/13/21 0832)  . fentaNYL infusion INTRAVENOUS 100 mcg/hr (05/13/21 1149)  . midazolam 2 mg/hr (05/13/21 1143)  .  norepinephrine     . allopurinol  200 mg Per Tube q AM  . aspirin  81 mg Per Tube Daily  . atorvastatin  40 mg Per Tube q AM  . budesonide (PULMICORT) nebulizer solution  0.5 mg Nebulization BID  . chlorhexidine gluconate (MEDLINE KIT)  15 mL Mouth Rinse BID  . Chlorhexidine Gluconate Cloth  6 each Topical Q0600  . cholecalciferol  1,000 Units Per Tube Daily  . docusate  100 mg Per Tube BID  . [START ON 05/15/2021] epoetin (EPOGEN/PROCRIT) injection  4,000 Units Intravenous Q M,W,F-HD  . feeding supplement (PROSource TF)  90 mL Per Tube TID  . fentaNYL (SUBLIMAZE) injection  50 mcg Intravenous Once  . free water  30 mL Per Tube Q4H  . furosemide  80 mg Per Tube Daily  . heparin  5,000 Units Subcutaneous Q8H  . insulin aspart  0-9 Units Subcutaneous Q4H  . insulin glargine  15 Units Subcutaneous QHS  . ipratropium-albuterol  3 mL Nebulization Q6H  . mouth rinse  15 mL Mouth Rinse 10 times per day  . methylPREDNISolone (SOLU-MEDROL) injection  20 mg Intravenous BID  . multivitamin  1 tablet Per Tube QHS  . pantoprazole (PROTONIX) IV  40 mg Intravenous Q24H  . polyethylene glycol  17 g Per Tube Daily  . sodium chloride flush  3 mL Intravenous Q12H  . sodium chloride flush  3 mL Intravenous Q12H  . triamcinolone cream   Topical BID    Assessment/ Plan:   55 year old obese male with history of hypertension, coronary artery disease, congestive heart failure, obstructive sleep apnea, diabetes with peripheral vascular disease. He was admitted to the hospital with history of mental status changes.  Patient found to have V. Fib and had cardiac arrest.  Was found to be hyperkalemic. Patient had successful dialysis yesterday.   Principal Problem:   Hyperkalemia Active Problems:   Essential hypertension   CAD (coronary artery disease)   Chronic gout of multiple sites   ESRD (end stage renal disease) on dialysis (HCC)   Chronic diastolic heart failure (HCC)   Acute on chronic  respiratory failure with hypoxia (HCC)   COPD (chronic obstructive pulmonary disease) (HCC)   HLD (hyperlipidemia)   Fall at home, initial encounter   Acute metabolic encephalopathy   Uremia   Elevated troponin   Leukocytosis   Type II diabetes mellitus with renal manifestations (HCC)  ESRD: We will dialyze again today and attempt fluid removal.  Hyperkalemia: Dialysis with 2K bath.  Respiratory failure: Presently on  the vent and is being weaned.  Case discussed with ICU team and will continue to monitor closely daily basis.     LOS: [place], MD [facility] 5/28/20221:33 PM

## 2021-05-13 NOTE — Progress Notes (Signed)
[facility] Cardiology  SUBJECTIVE: Patient intubated and sedated   Vitals:   05/13/21 0500 05/13/21 0600 05/13/21 0700 05/13/21 0722  BP: (!) 153/92 129/76 134/80   Pulse: 67 66 73   Resp: 19 18 (!) 21   Temp: 98.78 F (37.1 C) 98.6 F (37 C) 98.6 F (37 C)   TempSrc:      SpO2: 95% 94% 92% 93%  Weight:      Height:         Intake/Output Summary (Last 24 hours) at 05/13/2021 0829 Last data filed at 05/13/2021 0600 Gross per 24 hour  Intake 1356.41 ml  Output 1602 ml  Net -245.59 ml      PHYSICAL EXAM  General: Intubated and sedated HEENT: Intubated Neck:  No JVD.  Lungs: Clear bilaterally to auscultation and percussion. Heart: HRRR . Normal S1 and S2 without gallops or murmurs.  Abdomen: Bowel sounds are positive, abdomen soft and non-tender  Msk:  Back normal, normal gait. Normal strength and tone for age. Extremities: No clubbing, cyanosis or edema.   Neuro: Sedated Psych: Sedated   LABS: Basic Metabolic Panel: Recent Labs    05/12/21 0615 05/13/21 0418  NA 136 138  K 5.1 5.5*  CL 97* 99  CO2 24 27  GLUCOSE 247* 205*  BUN 79* 79*  CREATININE 8.98* 8.38*  CALCIUM 8.9 9.0  MG 2.1 2.0  PHOS 7.6* 9.3*   Liver Function Tests: No results for input(s): AST, ALT, ALKPHOS, BILITOT, PROT, ALBUMIN in the last 72 hours. No results for input(s): LIPASE, AMYLASE in the last 72 hours. CBC: Recent Labs    05/12/21 0615 05/13/21 0418  WBC 12.2* 12.9*  NEUTROABS 10.9* 11.6*  HGB 9.3* 9.9*  HCT 29.0* 31.6*  MCV 88.7 91.1  PLT 211 208   Cardiac Enzymes: No results for input(s): CKTOTAL, CKMB, CKMBINDEX, TROPONINI in the last 72 hours. BNP: Invalid input(s): POCBNP D-Dimer: No results for input(s): DDIMER in the last 72 hours. Hemoglobin A1C: No results for input(s): HGBA1C in the last 72 hours. Fasting Lipid Panel: No results for input(s): CHOL, HDL, LDLCALC, TRIG, CHOLHDL, LDLDIRECT in the last 72 hours. Thyroid Function Tests: No results for input(s):  TSH, T4TOTAL, T3FREE, THYROIDAB in the last 72 hours.  Invalid input(s): FREET3 Anemia Panel: No results for input(s): VITAMINB12, FOLATE, FERRITIN, TIBC, IRON, RETICCTPCT in the last 72 hours.  DG Chest Port 1 View  Result Date: 05/11/2021 CLINICAL DATA:  Line placement. EXAM: PORTABLE CHEST 1 VIEW COMPARISON:  Same day. FINDINGS: Stable cardiomegaly. Endotracheal and nasogastric tubes are unchanged in position. Interval placement right internal jugular catheter with distal tip in expected position of the SVC. No pneumothorax is noted. Mild bibasilar subsegmental atelectasis is noted. Bony thorax is unremarkable. IMPRESSION: Interval placement of right internal jugular catheter with distal tip in expected position of the SVC. No pneumothorax is noted. Electronically Signed   By: Marijo Conception M.D.   On: 05/11/2021 12:39   DG Chest Port 1 View  Result Date: 05/11/2021 CLINICAL DATA:  Acute respiratory failure with hypoxia. EXAM: PORTABLE CHEST 1 VIEW COMPARISON:  May 08, 2021. FINDINGS: Endotracheal tube tip is approximately 2.8 cm above the carina. Enteric tube courses below the diaphragm in outside the field of view. Similar enlarged cardiac silhouette, accentuated by AP technique. Pulmonary vascular congestion. Patchy airspace opacities, slightly increased at the left lung base. Similar possible small left pleural effusion. No visible pneumothorax on this limited semi erect AP radiograph. IMPRESSION: 1. Similar cardiomegaly and  pulmonary vascular congestion. Possible small left pleural effusion. 2. Patchy airspace opacities (slightly increased at the left lung base), which could represent edema and/or infection. Electronically Signed   By: Margaretha Sheffield MD   On: 05/11/2021 11:12     Echo LVEF 50 to 55%  TELEMETRY: Sinus rhythm:  ASSESSMENT AND PLAN:  Principal Problem:   Hyperkalemia Active Problems:   Essential hypertension   CAD (coronary artery disease)   Chronic gout of  multiple sites   ESRD (end stage renal disease) on dialysis (HCC)   Chronic diastolic heart failure (HCC)   Acute on chronic respiratory failure with hypoxia (HCC)   COPD (chronic obstructive pulmonary disease) (HCC)   HLD (hyperlipidemia)   Fall at home, initial encounter   Acute metabolic encephalopathy   Uremia   Elevated troponin   Leukocytosis   Type II diabetes mellitus with renal manifestations ([place])    1. Cardiac arrest, polymorphic VT, occurred during dialysis, 12 minutes of CPR, now intubated and sedated 2. Coronary artery disease, history of NSTEMI, status post PCI and stent placement of RCA in remote past. Troponin borderline elevated (129- 143- 103- 86- 126- 208) 3. History of ischemic cardiomyopathy, echo shows normal left ventricular function with no regional wall motion abnormalities 4. ESRD on hemodialysis 5. Acute hypoxic respiratory distress, remains intubated 6. Severe encephalopathy  Recommendations  1.  Agree with current therapy 2.  Continue aspirin 3.  Continue atorvastatin 4.  Consider cardiac catheterization during this hospitalization if patient resumes consciousness    Isaias Cowman, MD, PhD, [facility] 05/13/2021 8:29 AM

## 2021-05-13 NOTE — Progress Notes (Signed)
Pt remains on vent this AM. Fent and versed gtt's weaned down t/o day to prep for SBT. Pt underwent dialysis, tolerated entire session, VSS. Foley remains in place, putting out clear amber urine. PRN Fent boluses given via pump x1.

## 2021-05-13 NOTE — Progress Notes (Signed)
Vent alarming. End tidal increase noted. Patient with thick mucus plugging. Manually suctioned and bagged patient with help of RN to lavage plugs from airway. Patient tolerated interventions well. Mild rhonci still noted. Bed pecussion to see if will help with issue. Updated NP

## 2021-05-14 LAB — CBC WITH DIFFERENTIAL/PLATELET
Abs Immature Granulocytes: 0.07 10*3/uL (ref 0.00–0.07)
Basophils Absolute: 0 10*3/uL (ref 0.0–0.1)
Basophils Relative: 0 %
Eosinophils Absolute: 0 10*3/uL (ref 0.0–0.5)
Eosinophils Relative: 0 %
HCT: 32.3 % — ABNORMAL LOW (ref 39.0–52.0)
Hemoglobin: 10 g/dL — ABNORMAL LOW (ref 13.0–17.0)
Immature Granulocytes: 1 %
Lymphocytes Relative: 5 %
Lymphs Abs: 0.7 10*3/uL (ref 0.7–4.0)
MCH: 28.2 pg (ref 26.0–34.0)
MCHC: 31 g/dL (ref 30.0–36.0)
MCV: 91.2 fL (ref 80.0–100.0)
Monocytes Absolute: 0.6 10*3/uL (ref 0.1–1.0)
Monocytes Relative: 5 %
Neutro Abs: 11.5 10*3/uL — ABNORMAL HIGH (ref 1.7–7.7)
Neutrophils Relative %: 89 %
Platelets: 197 10*3/uL (ref 150–400)
RBC: 3.54 MIL/uL — ABNORMAL LOW (ref 4.22–5.81)
RDW: 14.3 % (ref 11.5–15.5)
WBC: 12.8 10*3/uL — ABNORMAL HIGH (ref 4.0–10.5)
nRBC: 0 % (ref 0.0–0.2)

## 2021-05-14 LAB — BASIC METABOLIC PANEL
Anion gap: 13 (ref 5–15)
BUN: 75 mg/dL — ABNORMAL HIGH (ref 6–20)
CO2: 27 mmol/L (ref 22–32)
Calcium: 9.1 mg/dL (ref 8.9–10.3)
Chloride: 97 mmol/L — ABNORMAL LOW (ref 98–111)
Creatinine, Ser: 7.39 mg/dL — ABNORMAL HIGH (ref 0.61–1.24)
GFR, Estimated: 8 mL/min — ABNORMAL LOW (ref >60)
Glucose, Bld: 191 mg/dL — ABNORMAL HIGH (ref 70–99)
Potassium: 5.4 mmol/L — ABNORMAL HIGH (ref 3.5–5.1)
Sodium: 137 mmol/L (ref 135–145)

## 2021-05-14 LAB — GLUCOSE, CAPILLARY
Glucose-Capillary: 177 mg/dL — ABNORMAL HIGH (ref 70–99)
Glucose-Capillary: 189 mg/dL — ABNORMAL HIGH (ref 70–99)
Glucose-Capillary: 190 mg/dL — ABNORMAL HIGH (ref 70–99)
Glucose-Capillary: 191 mg/dL — ABNORMAL HIGH (ref 70–99)
Glucose-Capillary: 222 mg/dL — ABNORMAL HIGH (ref 70–99)
Glucose-Capillary: 232 mg/dL — ABNORMAL HIGH (ref 70–99)

## 2021-05-14 LAB — BLOOD GAS, ARTERIAL
Acid-Base Excess: 0.4 mmol/L (ref 0.0–2.0)
Bicarbonate: 26 mmol/L (ref 20.0–28.0)
Expiratory PAP: 9
FIO2: 35
Inspiratory PAP: 18
O2 Saturation: 93.8 %
Patient temperature: 37
pCO2 arterial: 45 mmHg (ref 32.0–48.0)
pH, Arterial: 7.37 (ref 7.350–7.450)
pO2, Arterial: 72 mmHg — ABNORMAL LOW (ref 83.0–108.0)

## 2021-05-14 LAB — POTASSIUM: Potassium: 5 mmol/L (ref 3.5–5.1)

## 2021-05-14 LAB — MAGNESIUM: Magnesium: 2 mg/dL (ref 1.7–2.4)

## 2021-05-14 LAB — PHOSPHORUS: Phosphorus: 9 mg/dL — ABNORMAL HIGH (ref 2.5–4.6)

## 2021-05-14 MED ORDER — ALLOPURINOL 100 MG PO TABS
200.0000 mg | ORAL_TABLET | Freq: Every morning | ORAL | Status: DC
  Administered 2021-05-16 – 2021-05-19 (×4): 200 mg via ORAL
  Filled 2021-05-14 (×6): qty 2

## 2021-05-14 MED ORDER — ATORVASTATIN CALCIUM 20 MG PO TABS
40.0000 mg | ORAL_TABLET | Freq: Every morning | ORAL | Status: DC
  Administered 2021-05-16 – 2021-05-19 (×4): 40 mg via ORAL
  Filled 2021-05-14 (×4): qty 2

## 2021-05-14 MED ORDER — CYCLOBENZAPRINE HCL 10 MG PO TABS
5.0000 mg | ORAL_TABLET | Freq: Two times a day (BID) | ORAL | Status: DC | PRN
  Administered 2021-05-18: 5 mg via ORAL
  Filled 2021-05-14: qty 1

## 2021-05-14 MED ORDER — RENA-VITE PO TABS
1.0000 | ORAL_TABLET | Freq: Every day | ORAL | Status: DC
  Administered 2021-05-15 – 2021-05-18 (×4): 1 via ORAL
  Filled 2021-05-14 (×5): qty 1

## 2021-05-14 MED ORDER — ASPIRIN 81 MG PO CHEW
81.0000 mg | CHEWABLE_TABLET | Freq: Every day | ORAL | Status: DC
  Administered 2021-05-16 – 2021-05-19 (×4): 81 mg via ORAL
  Filled 2021-05-14 (×4): qty 1

## 2021-05-14 MED ORDER — DOCUSATE SODIUM 50 MG/5ML PO LIQD
100.0000 mg | Freq: Two times a day (BID) | ORAL | Status: DC | PRN
  Filled 2021-05-14: qty 10

## 2021-05-14 MED ORDER — CHOLECALCIFEROL 10 MCG/ML (400 UNIT/ML) PO LIQD
1000.0000 [IU] | Freq: Every day | ORAL | Status: DC
  Administered 2021-05-16 – 2021-05-19 (×4): 1000 [IU] via ORAL
  Filled 2021-05-14 (×5): qty 2.5

## 2021-05-14 MED ORDER — POLYETHYLENE GLYCOL 3350 17 G PO PACK: 17.0000 g | PACK | Freq: Every day | ORAL | Status: DC | PRN

## 2021-05-14 MED ORDER — ACETAMINOPHEN 325 MG PO TABS
650.0000 mg | ORAL_TABLET | ORAL | Status: DC | PRN
  Administered 2021-05-16: 650 mg via ORAL
  Filled 2021-05-14: qty 2

## 2021-05-14 MED ORDER — FUROSEMIDE 40 MG PO TABS
80.0000 mg | ORAL_TABLET | Freq: Every day | ORAL | Status: DC
  Administered 2021-05-16 – 2021-05-18 (×3): 80 mg via ORAL
  Filled 2021-05-14 (×2): qty 4
  Filled 2021-05-14: qty 2

## 2021-05-14 NOTE — Progress Notes (Signed)
Pt extubated to bipap, no complications, tolerating well at this time.

## 2021-05-14 NOTE — Progress Notes (Signed)
Pt extubated at this time to bipap. VSS at this time. Pt awake and following commands.

## 2021-05-14 NOTE — Progress Notes (Signed)
[facility] Cardiology  SUBJECTIVE: Intubated, but awake, difficulty to wean due to minimal respiratory effort   Vitals:   05/14/21 0239 05/14/21 0500 05/14/21 0600 05/14/21 0700  BP:  (!) 155/83 136/79 (!) 144/86  Pulse:  71 74 70  Resp:  (!) 21 16 18   Temp:  99.5 F (37.5 C) 99.32 F (37.4 C) 99.14 F (37.3 C)  TempSrc:      SpO2: 96% 96% 95% 95%  Weight:      Height:         Intake/Output Summary (Last 24 hours) at 05/14/2021 7341 Last data filed at 05/14/2021 0500 Gross per 24 hour  Intake --  Output 3500 ml  Net -3500 ml      PHYSICAL EXAM  General: Well developed, well nourished, in no acute distress HEENT:  Normocephalic and atramatic Neck:  No JVD.  Lungs: Clear bilaterally to auscultation and percussion. Heart: HRRR . Normal S1 and S2 without gallops or murmurs.  Abdomen: Bowel sounds are positive, abdomen soft and non-tender  Msk:  Back normal, normal gait. Normal strength and tone for age. Extremities: No clubbing, cyanosis or edema.   Neuro: Alert and oriented X 3. Psych:  Good affect, responds appropriately   LABS: Basic Metabolic Panel: Recent Labs    05/13/21 0418 05/14/21 0420  NA 138 137  K 5.5* 5.4*  CL 99 97*  CO2 27 27  GLUCOSE 205* 191*  BUN 79* 75*  CREATININE 8.38* 7.39*  CALCIUM 9.0 9.1  MG 2.0 2.0  PHOS 9.3* 9.0*   Liver Function Tests: No results for input(s): AST, ALT, ALKPHOS, BILITOT, PROT, ALBUMIN in the last 72 hours. No results for input(s): LIPASE, AMYLASE in the last 72 hours. CBC: Recent Labs    05/13/21 0418 05/14/21 0420  WBC 12.9* 12.8*  NEUTROABS 11.6* 11.5*  HGB 9.9* 10.0*  HCT 31.6* 32.3*  MCV 91.1 91.2  PLT 208 197   Cardiac Enzymes: No results for input(s): CKTOTAL, CKMB, CKMBINDEX, TROPONINI in the last 72 hours. BNP: Invalid input(s): POCBNP D-Dimer: No results for input(s): DDIMER in the last 72 hours. Hemoglobin A1C: No results for input(s): HGBA1C in the last 72 hours. Fasting Lipid Panel: No  results for input(s): CHOL, HDL, LDLCALC, TRIG, CHOLHDL, LDLDIRECT in the last 72 hours. Thyroid Function Tests: No results for input(s): TSH, T4TOTAL, T3FREE, THYROIDAB in the last 72 hours.  Invalid input(s): FREET3 Anemia Panel: No results for input(s): VITAMINB12, FOLATE, FERRITIN, TIBC, IRON, RETICCTPCT in the last 72 hours.  No results found.   Echo LVEF 50 to 55%  TELEMETRY: Sinus rhythm:  ASSESSMENT AND PLAN:  Principal Problem:   Hyperkalemia Active Problems:   Essential hypertension   CAD (coronary artery disease)   Chronic gout of multiple sites   ESRD (end stage renal disease) on dialysis (HCC)   Chronic diastolic heart failure (HCC)   Acute on chronic respiratory failure with hypoxia (HCC)   COPD (chronic obstructive pulmonary disease) (HCC)   HLD (hyperlipidemia)   Fall at home, initial encounter   Acute metabolic encephalopathy   Uremia   Elevated troponin   Leukocytosis   Type II diabetes mellitus with renal manifestations ([place])    1. Cardiac arrest, polymorphic VT, occurred during dialysis, 12 minutes of CPR, now intubated and sedated 2. Coronary artery disease,history of NSTEMI, status post PCI and stent placement of RCA in remote past. Troponin borderline elevated (129- 143- 103- 86- 126- 208) 3.History of ischemic cardiomyopathy,echo shows normal left ventricular function with no  regional wall motion abnormalities 4. ESRD on hemodialysis 5. Acute hypoxic respiratory distress, remains intubated 6. Severe encephalopathy  Recommendations  1.  Agree with current therapy 2.  Continue aspirin 3.  Continue atorvastatin 4.  Consider cardiac catheterization during this hospitalization after extubation 5.  Consider ICD for secondary prevention  Isaias Cowman, MD, PhD, [facility] 05/14/2021 8:32 AM

## 2021-05-14 NOTE — Progress Notes (Signed)
NAME:  [name], MRN:  [id number], DOB:  Aug 28, 1966, LOS: 6 ADMISSION DATE:  05/08/2021  55 y.o.malewith a history ofdCHF,ESRD on hemodialysis COPD,diabetes morbid obesity who presents to ER with severealtered mental status. Had a fall hitting the left side of his forehead yesterday.   Went to dialysis this morning where he was noted to be somnolent, poorly interactive, not able to follow commands. +headache.   Pt unable to complete dialysis today, per EMS pt had fall yesterday, at dialysis patient kept falling asleep, unable to determine baseline mentation due to being a new dialysis site. Pt with bruising and swelling present to left eye. Pt also complains of back pain and a headache.  Patient subsequently developed acute SOB and hypoxia failed biPAP and emergently intubated   05/12/21- patient is weaned on ventilator to Fio2 45%.  Holding OGF for SBT today.  He is awake RASS-0 during my evaluation. Labs reviewed with mild trend up of wbc hb stable 9.3. POC glucose 216. On unasyn day 5, blood cultures negative.   05/13/21- patient received HD.  He was unable to be weaned today. Plan is for SBT as soon as possible for liberation from MV>   05/14/21- patient is extubated to BIPAP.  He is lucid and expressed hunger asking for [employer] meal.  Family came to bedside appreciative of care.    Antibiotics Given (last 72 hours)    Date/Time Action Medication Dose Rate   05/11/21 2229 New Bag/Given   Ampicillin-Sulbactam (UNASYN) 3 g in sodium chloride 0.9 % 100 mL IVPB 3 g 200 mL/hr   05/12/21 2220 New Bag/Given   Ampicillin-Sulbactam (UNASYN) 3 g in sodium chloride 0.9 % 100 mL IVPB 3 g 200 mL/hr           Objective   Blood pressure (!) 154/74, pulse 77, temperature 99.32 F (37.4 C), resp. rate 15, height 5' 2.99" (1.6 m), weight 122 kg, SpO2 (!) 88 %.    Vent Mode: Spontaneous FiO2 (%):  [40 %-50 %] 40 % Set Rate:  [18 bmp] 18 bmp Vt Set:  [450 mL] 450  mL PEEP:  [5 cmH20] 5 cmH20 Pressure Support:  [5 cmH20] 5 cmH20 Plateau Pressure:  [24 cmH20] 24 cmH20   Intake/Output Summary (Last 24 hours) at 05/14/2021 1710 Last data filed at 05/14/2021 1612 Gross per 24 hour  Intake 518.83 ml  Output 425 ml  Net 93.83 ml   Filed Weights   05/09/21 0439 05/10/21 0436 05/11/21 0448  Weight: 123.8 kg 123.6 kg 122 kg      REVIEW OF SYSTEMS  10 point ros done and is negative except hunger   PHYSICAL EXAMINATION:  GENERAL:obese apathetic age appropirate HEAD: Normocephalic, atraumatic.  EYES: Pupils equal, round, reactive to light.  No scleral icterus.  MOUTH: Moist mucosal membrane. NECK: Supple. PULMONARY decreased air entry CARDIOVASCULAR: S1 and S2. Regular rate and rhythm. No murmurs, rubs, or gallops.  GASTROINTESTINAL: Soft, nontender, -distended. Positive bowel sounds.  MUSCULOSKELETAL: No swelling, clubbing, or edema.  NEUROLOGIC: lucid caml grossly no FND SKIN:intact,warm,dry   Labs/imaging that I havepersonally reviewed  (right click and "Reselect all SmartList Selections" daily)       ASSESSMENT AND PLAN SYNOPSIS  55 yo morbidly obese AAM with severe acute hypoxic respiratory failure post HD with aspiration pna    Severe ACUTE Hypoxic and Hypercapnic Respiratory Failure -continue Mechanical Ventilator support -continue Bronchodilator Therapy -Wean Fio2 and PEEP as tolerated -VAP/VENT bundle implementation Vent Mode: Spontaneous FiO2 (%):  [  40 %-50 %] 40 % Set Rate:  [18 bmp] 18 bmp Vt Set:  [450 mL] 450 mL PEEP:  [5 cmH20] 5 cmH20 Pressure Support:  [5 cmH20] 5 cmH20 Plateau Pressure:  [24 cmH20] 24 cmH20   SEVERE COPD EXACERBATION -continue IV steroids as prescribed -continue NEB THERAPY as prescribed   CARDIAC FAILURE-cardiac arrest +ischemic cardiomyopathy ECHO pending Cardiology consulted May consider CATH  CARDIAC ICU monitoring   ESRD ON HD -continue Foley Catheter-assess need -Avoid  nephrotoxic agents -Follow urine output, BMP -Ensure adequate renal perfusion, optimize oxygenation -Renal dose medications HD as needed tolerated  NEUROLOGY Acute toxic metabolic encephalopathy, need for sedation Goal RASS -2 to -3   INFECTIOUS DISEASE Therapy for aspiration pneumonia RARE GRAM POSITIVE COCCI IN PAIRS  RARE GRAM VARIABLE ROD  -continue antibiotics as prescribed -follow up cultures   ENDO - ICU hypoglycemic\Hyperglycemia protocol -check FSBS per protocol   GI GI PROPHYLAXIS as indicated  NUTRITIONAL STATUS DIET-->TF's as tolerated Constipation protocol as indicated   ELECTROLYTES -follow labs as needed -replace as needed -pharmacy consultation and following     Best practice (right click and "Reselect all SmartList Selections" daily)  Diet:NPO Pain/Anxiety/Delirium protocol (if indicated):Yes(RASS goal -2) VAP protocol (if indicated):Yes DVT prophylaxis:Subcutaneous Heparin GI prophylaxis:H2B Glucose control:SSIYes Central venous access:N/A Arterial line:N/A Foley:N/A Mobility:bed rest PT consulted:N/A Code Status:full code Disposition:ICU      Labs   CBC: Recent Labs  Lab 05/10/21 0537 05/11/21 0534 05/12/21 0615 05/13/21 0418 05/14/21 0420  WBC 13.3* 10.7* 12.2* 12.9* 12.8*  NEUTROABS 10.7* 9.9* 10.9* 11.6* 11.5*  HGB 10.1* 9.4* 9.3* 9.9* 10.0*  HCT 31.2* 29.7* 29.0* 31.6* 32.3*  MCV 89.1 89.2 88.7 91.1 91.2  PLT 208 198 211 208 559    Basic Metabolic Panel: Recent Labs  Lab 05/10/21 0537 05/11/21 0534 05/12/21 0615 05/13/21 0418 05/14/21 0420  NA 138 138 136 138 137  K 4.3 5.8* 5.1 5.5* 5.4*  CL 99 100 97* 99 97*  CO2 24 23 24 27 27   GLUCOSE 131* 236* 247* 205* 191*  BUN 64* 95* 79* 79* 75*  CREATININE 10.45* 11.42* 8.98* 8.38* 7.39*  CALCIUM 8.5* 8.6* 8.9 9.0 9.1  MG 2.5* 2.5* 2.1 2.0 2.0  PHOS 7.7* 8.2* 7.6* 9.3* 9.0*   GFR: Estimated Creatinine Clearance: 13.7 mL/min (A) (by  C-G formula based on SCr of 7.39 mg/dL (H)). Recent Labs  Lab 05/11/21 0534 05/12/21 0615 05/13/21 0418 05/14/21 0420  WBC 10.7* 12.2* 12.9* 12.8*    Liver Function Tests: Recent Labs  Lab 05/08/21 0859  AST 13*  ALT 16  ALKPHOS 145*  BILITOT 0.9  PROT 8.6*  ALBUMIN 4.3   No results for input(s): LIPASE, AMYLASE in the last 168 hours. Recent Labs  Lab 05/08/21 0859  AMMONIA 25    ABG    Component Value Date/Time   PHART 7.31 (L) 05/09/2021 0434   PCO2ART 50 (H) 05/09/2021 0434   PO2ART 124 (H) 05/09/2021 0434   HCO3 25.2 05/09/2021 0434   TCO2 30 02/24/2020 0635   ACIDBASEDEF 1.4 05/09/2021 0434   O2SAT 98.5 05/09/2021 0434     Coagulation Profile: No results for input(s): INR, PROTIME in the last 168 hours.  Cardiac Enzymes: No results for input(s): CKTOTAL, CKMB, CKMBINDEX, TROPONINI in the last 168 hours.  HbA1C: Hgb A1c MFr Bld  Date/Time Value Ref Range Status  05/09/2021 04:53 AM 7.1 (H) 4.8 - 5.6 % Final    Comment:    (NOTE)  Prediabetes: 5.7 - 6.4         Diabetes: >6.4         Glycemic control for adults with diabetes: <7.0   01/28/2021 01:24 AM 5.6 4.8 - 5.6 % Final    Comment:    (NOTE) Pre diabetes:          5.7%-6.4%  Diabetes:              >6.4%  Glycemic control for   <7.0% adults with diabetes     CBG: Recent Labs  Lab 05/13/21 2327 05/14/21 0407 05/14/21 0725 05/14/21 1122 05/14/21 1635  GLUCAP 177* 189* 177* 191* 232*    Allergies Allergies  Allergen Reactions  . Shellfish Allergy Anaphylaxis    Face and throat swelling, difficulty breathing Allergy can be triggered by touching (contact)  . Other   . Betadine [Povidone Iodine] Rash  . Povidone-Iodine Rash       DVT/GI PRX  assessed I Assessed the need for Labs I Assessed the need for Foley I Assessed the need for Central Venous Line Family Discussion when available I Assessed the need for Mobilization I made an Assessment of medications to be  adjusted accordingly Safety Risk assessment completed  [place] ICU TEAM    Critical care provider statement:    Critical care time (minutes):  33   Critical care time was exclusive of:  Separately billable procedures and  treating other patients   Critical care was necessary to treat or prevent imminent or  life-threatening deterioration of the following conditions:  acute hypoxemic respiratory failure, severe copd exacerbation , acute CHF , obesity, multiple comorbid conditions   Critical care was time spent personally by me on the following  activities:  Development of treatment plan with patient or surrogate,  discussions with consultants, evaluation of patient's response to  treatment, examination of patient, obtaining history from patient or  surrogate, ordering and performing treatments and interventions, ordering  and review of laboratory studies and re-evaluation of patient's condition   I assumed direction of critical care for this patient from another  provider in my specialty: no      Ottie Glazier, M.D.  Pulmonary [facility] [place]

## 2021-05-14 NOTE — Progress Notes (Signed)
[facility]  PROGRESS NOTE   Subjective:   Events noted.  Patient was unable to be extubated. Awake and alert. Had stable dialysis yesterday.  Objective:  Vital signs in last 24 hours:  Temp:  [98.2 F (36.8 C)-99.5 F (37.5 C)] 99.14 F (37.3 C) (05/29 1000) Pulse Rate:  [62-86] 73 (05/29 1000) Resp:  [14-24] 24 (05/29 1000) BP: (122-161)/(67-141) 156/83 (05/29 1000) SpO2:  [91 %-100 %] 96 % (05/29 1000) FiO2 (%):  [50 %] 50 % (05/29 0239)  Weight change:  Filed Weights   05/09/21 0439 05/10/21 0436 05/11/21 0448  Weight: 123.8 kg 123.6 kg 122 kg    Intake/Output: I/O last 3 completed shifts: In: 821.9 [I.V.:276.9; NG/GT:495; IV Piggyback:50] Out: 4000 [Urine:1000; Other:3000]   Intake/Output this shift:  Total I/O In: 3 [I.V.:3] Out: -   Physical Exam: General:  No acute distress  Head:  Normocephalic, atraumatic. Moist oral mucosal membranes  Eyes:  Anicteric  Neck:  Supple  Lungs:   Clear to auscultation, normal effort  Heart:  S1S2 no rubs  Abdomen:   Soft, nontender, bowel sounds present  Extremities:  peripheral edema.  Neurologic:  Awake, alert, following commands  Skin:  No lesions  Access:     Basic Metabolic Panel: Recent Labs  Lab 05/10/21 0537 05/11/21 0534 05/12/21 0615 05/13/21 0418 05/14/21 0420  NA 138 138 136 138 137  K 4.3 5.8* 5.1 5.5* 5.4*  CL 99 100 97* 99 97*  CO2 _0 GLUCOSE 131* 236* 247* 205* 191*  BUN 64* 95* 79* 79* 75*  CREATININE 10.45* 11.42* 8.98* 8.38* 7.39*  CALCIUM 8.5* 8.6* 8.9 9.0 9.1  MG 2.5* 2.5* 2.1 2.0 2.0  PHOS 7.7* 8.2* 7.6* 9.3* 9.0*    Liver Function Tests: Recent Labs  Lab 05/08/21 0859  AST 13*  ALT 16  ALKPHOS 145*  BILITOT 0.9  PROT 8.6*  ALBUMIN 4.3   No results for input(s): LIPASE, AMYLASE in the last 168 hours. Recent Labs  Lab 05/08/21 0859  AMMONIA 25    CBC: Recent Labs  Lab 05/10/21 0537 05/11/21 0534 05/12/21 0615 05/13/21 0418  05/14/21 0420  WBC 13.3* 10.7* 12.2* 12.9* 12.8*  NEUTROABS 10.7* 9.9* 10.9* 11.6* 11.5*  HGB 10.1* 9.4* 9.3* 9.9* 10.0*  HCT 31.2* 29.7* 29.0* 31.6* 32.3*  MCV 89.1 89.2 88.7 91.1 91.2  PLT 208 198 211 208 197    Cardiac Enzymes: No results for input(s): CKTOTAL, CKMB, CKMBINDEX, TROPONINI in the last 168 hours.  BNP: Invalid input(s): POCBNP  CBG: Recent Labs  Lab 05/13/21 1938 05/13/21 2327 05/14/21 0407 05/14/21 0725 05/14/21 1122  GLUCAP 202* 177* 189* 177* 191*    Microbiology: Results for orders placed or performed during the hospital encounter of 05/08/21  Resp Panel by RT-PCR (Flu A&B, Covid) Nasopharyngeal Swab     Status: None   Collection Time: 05/08/21  8:04 AM   Specimen: Nasopharyngeal Swab; Nasopharyngeal(NP) swabs in vial transport medium  Result Value Ref Range Status   SARS Coronavirus 2 by RT PCR NEGATIVE NEGATIVE Final    Comment: (NOTE) SARS-CoV-2 target nucleic acids are NOT DETECTED.  The SARS-CoV-2 RNA is generally detectable in upper respiratory specimens during the acute phase of infection. The lowest concentration of SARS-CoV-2 viral copies this assay can detect is 138 copies/mL. A negative result does not preclude SARS-Cov-2 infection and should not be used as the sole basis for treatment or other patient management decisions. A negative result may  occur with  improper specimen collection/handling, submission of specimen other than nasopharyngeal swab, presence of viral mutation(s) within the areas targeted by this assay, and inadequate number of viral copies(<138 copies/mL). A negative result must be combined with clinical observations, patient history, and epidemiological information. The expected result is Negative.  Fact Sheet for Patients:  EntrepreneurPulse.com.au  Fact Sheet for Healthcare Providers:  IncredibleEmployment.be  This test is no t yet approved or cleared by the [place] FDA  and  has been authorized for detection and/or diagnosis of SARS-CoV-2 by FDA under an Emergency Use Authorization (EUA). This EUA will remain  in effect (meaning this test can be used) for the duration of the COVID-19 declaration under Section 564(b)(1) of the Act, 21 U.S.C.section 360bbb-3(b)(1), unless the authorization is terminated  or revoked sooner.       Influenza A by PCR NEGATIVE NEGATIVE Final   Influenza B by PCR NEGATIVE NEGATIVE Final    Comment: (NOTE) The Xpert Xpress SARS-CoV-2/FLU/RSV plus assay is intended as an aid in the diagnosis of influenza from Nasopharyngeal swab specimens and should not be used as a sole basis for treatment. Nasal washings and aspirates are unacceptable for Xpert Xpress SARS-CoV-2/FLU/RSV testing.  Fact Sheet for Patients: EntrepreneurPulse.com.au  Fact Sheet for Healthcare Providers: IncredibleEmployment.be  This test is not yet approved or cleared by the [place] FDA and has been authorized for detection and/or diagnosis of SARS-CoV-2 by FDA under an Emergency Use Authorization (EUA). This EUA will remain in effect (meaning this test can be used) for the duration of the COVID-19 declaration under Section 564(b)(1) of the Act, 21 U.S.C. section 360bbb-3(b)(1), unless the authorization is terminated or revoked.  Performed at [facility], [address] [place], [place] [address]   Blood culture (single)     Status: None   Collection Time: 05/08/21  8:59 AM   Specimen: BLOOD  Result Value Ref Range Status   Specimen Description BLOOD RIGHT [facility]  Final   Special Requests   Final    BOTTLES DRAWN AEROBIC AND ANAEROBIC Blood Culture results may not be optimal due to an excessive volume of blood received in culture bottles   Culture   Final    NO GROWTH 5 DAYS Performed at [facility], [address] [place], [place] [address]    Report Status 05/13/2021 FINAL  Final   MRSA PCR Screening     Status: None   Collection Time: 05/08/21  6:50 PM   Specimen: Nasopharyngeal  Result Value Ref Range Status   MRSA by PCR NEGATIVE NEGATIVE Final    Comment:        The GeneXpert MRSA Assay (FDA approved for NASAL specimens only), is one component of a comprehensive MRSA colonization surveillance program. It is not intended to diagnose MRSA infection nor to guide or monitor treatment for MRSA infections. Performed at [facility], [address] [place], [place] [address]   Culture, Respiratory w Gram Stain     Status: None   Collection Time: 05/09/21 12:16 AM   Specimen: Tracheal Aspirate; Respiratory  Result Value Ref Range Status   Specimen Description   Final    TRACHEAL ASPIRATE Performed at [facility], [address], [place], [place] [address]    Special Requests   Final    NONE Performed at [facility], [address] [place], [place] [address]    Gram Stain   Final    MODERATE WBC PRESENT,BOTH PMN AND MONONUCLEAR  RARE GRAM POSITIVE COCCI IN PAIRS RARE GRAM VARIABLE ROD    Culture   Final    FEW Normal respiratory flora-no Staph aureus or Pseudomonas seen Performed at [facility], [address], [place], [place] [address]    Report Status 05/11/2021 FINAL  Final    Coagulation Studies: No results for input(s): LABPROT, INR in the last 72 hours.  Urinalysis: No results for input(s): COLORURINE, LABSPEC, PHURINE, GLUCOSEU, HGBUR, BILIRUBINUR, KETONESUR, PROTEINUR, UROBILINOGEN, NITRITE, LEUKOCYTESUR in the last 72 hours.  Invalid input(s): APPERANCEUR    Imaging: No results found.   Medications:   . sodium chloride    . sodium chloride    . sodium chloride    . sodium chloride    . sodium chloride    . famotidine (PEPCID) IV Stopped (05/12/21 2159)  . feeding supplement (VITAL 1.5 CAL) 45 mL/hr at 05/13/21 1905  . fentaNYL infusion INTRAVENOUS 150 mcg/hr (05/14/21 0101)  .  midazolam 3 mg/hr (05/13/21 2209)   . allopurinol  200 mg Per Tube q AM  . aspirin  81 mg Per Tube Daily  . atorvastatin  40 mg Per Tube q AM  . budesonide (PULMICORT) nebulizer solution  0.5 mg Nebulization BID  . chlorhexidine gluconate (MEDLINE KIT)  15 mL Mouth Rinse BID  . Chlorhexidine Gluconate Cloth  6 each Topical Q0600  . cholecalciferol  1,000 Units Per Tube Daily  . docusate  100 mg Per Tube BID  . [START ON 05/15/2021] epoetin (EPOGEN/PROCRIT) injection  4,000 Units Intravenous Q M,W,F-HD  . feeding supplement (PROSource TF)  90 mL Per Tube TID  . fentaNYL (SUBLIMAZE) injection  50 mcg Intravenous Once  . free water  30 mL Per Tube Q4H  . furosemide  80 mg Per Tube Daily  . heparin  5,000 Units Subcutaneous Q8H  . insulin aspart  0-15 Units Subcutaneous Q4H  . insulin glargine  15 Units Subcutaneous QHS  . ipratropium-albuterol  3 mL Nebulization Q6H  . mouth rinse  15 mL Mouth Rinse 10 times per day  . methylPREDNISolone (SOLU-MEDROL) injection  20 mg Intravenous BID  . multivitamin  1 tablet Per Tube QHS  . pantoprazole (PROTONIX) IV  40 mg Intravenous Q24H  . polyethylene glycol  17 g Per Tube Daily  . sodium chloride flush  3 mL Intravenous Q12H  . sodium chloride flush  3 mL Intravenous Q12H  . triamcinolone cream   Topical BID    Assessment/ Plan:     Principal Problem:   Hyperkalemia Active Problems:   Essential hypertension   CAD (coronary artery disease)   Chronic gout of multiple sites   ESRD (end stage renal disease) on dialysis (HCC)   Chronic diastolic heart failure (HCC)   Acute on chronic respiratory failure with hypoxia (HCC)   COPD (chronic obstructive pulmonary disease) (HCC)   HLD (hyperlipidemia)   Fall at home, initial encounter   Acute metabolic encephalopathy   Uremia   Elevated troponin   Leukocytosis   Type II diabetes mellitus with renal manifestations ([place])  55 year old obese male with history of hypertension, coronary artery  disease, congestive heart failure, obstructive sleep apnea, diabetes with peripheral vascular disease. He was admitted to the hospital with history of mental status changes.  Patient found to have V. Fib and had cardiac arrest.  Patient had successful dialysis yesterday.  Hyperkalemia: Dialysis with 2K bath.  Respiratory failure: Presently on the vent and is being weaned.  ESRD: We will schedule for  dialysis again in a.m.   LOS: [place], MD [facility] 5/29/202211:33 AM

## 2021-05-14 NOTE — Progress Notes (Signed)
Received report from Joneen Caraway, RN at this time.

## 2021-05-15 DIAGNOSIS — I2510 Atherosclerotic heart disease of native coronary artery without angina pectoris: Secondary | ICD-10-CM | POA: Diagnosis not present

## 2021-05-15 DIAGNOSIS — J9621 Acute and chronic respiratory failure with hypoxia: Principal | ICD-10-CM

## 2021-05-15 DIAGNOSIS — E875 Hyperkalemia: Secondary | ICD-10-CM | POA: Diagnosis not present

## 2021-05-15 DIAGNOSIS — I4901 Ventricular fibrillation: Secondary | ICD-10-CM | POA: Diagnosis not present

## 2021-05-15 LAB — CBC WITH DIFFERENTIAL/PLATELET
Abs Immature Granulocytes: 0.08 10*3/uL — ABNORMAL HIGH (ref 0.00–0.07)
Basophils Absolute: 0 10*3/uL (ref 0.0–0.1)
Basophils Relative: 0 %
Eosinophils Absolute: 0 10*3/uL (ref 0.0–0.5)
Eosinophils Relative: 0 %
HCT: 29.4 % — ABNORMAL LOW (ref 39.0–52.0)
Hemoglobin: 9.5 g/dL — ABNORMAL LOW (ref 13.0–17.0)
Immature Granulocytes: 1 %
Lymphocytes Relative: 6 %
Lymphs Abs: 1 10*3/uL (ref 0.7–4.0)
MCH: 28.6 pg (ref 26.0–34.0)
MCHC: 32.3 g/dL (ref 30.0–36.0)
MCV: 88.6 fL (ref 80.0–100.0)
Monocytes Absolute: 0.9 10*3/uL (ref 0.1–1.0)
Monocytes Relative: 5 %
Neutro Abs: 13.7 10*3/uL — ABNORMAL HIGH (ref 1.7–7.7)
Neutrophils Relative %: 88 %
Platelets: 179 10*3/uL (ref 150–400)
RBC: 3.32 MIL/uL — ABNORMAL LOW (ref 4.22–5.81)
RDW: 14.2 % (ref 11.5–15.5)
WBC: 15.7 10*3/uL — ABNORMAL HIGH (ref 4.0–10.5)
nRBC: 0 % (ref 0.0–0.2)

## 2021-05-15 LAB — BLOOD GAS, ARTERIAL
Acid-Base Excess: 4.5 mmol/L — ABNORMAL HIGH (ref 0.0–2.0)
Acid-base deficit: 0.1 mmol/L (ref 0.0–2.0)
Bicarbonate: 26.7 mmol/L (ref 20.0–28.0)
Bicarbonate: 30.3 mmol/L — ABNORMAL HIGH (ref 20.0–28.0)
Delivery systems: POSITIVE
Expiratory PAP: 8
FIO2: 0.35
Inspiratory PAP: 24
O2 Saturation: 94.5 %
O2 Saturation: 96.9 %
Patient temperature: 37
Patient temperature: 37
pCO2 arterial: 53 mmHg — ABNORMAL HIGH (ref 32.0–48.0)
pCO2 arterial: 50 mmHg — ABNORMAL HIGH (ref 32.0–48.0)
pH, Arterial: 7.31 — ABNORMAL LOW (ref 7.350–7.450)
pH, Arterial: 7.39 (ref 7.350–7.450)
pO2, Arterial: 80 mmHg — ABNORMAL LOW (ref 83.0–108.0)
pO2, Arterial: 90 mmHg (ref 83.0–108.0)

## 2021-05-15 LAB — GLUCOSE, CAPILLARY
Glucose-Capillary: 110 mg/dL — ABNORMAL HIGH (ref 70–99)
Glucose-Capillary: 145 mg/dL — ABNORMAL HIGH (ref 70–99)
Glucose-Capillary: 145 mg/dL — ABNORMAL HIGH (ref 70–99)
Glucose-Capillary: 146 mg/dL — ABNORMAL HIGH (ref 70–99)
Glucose-Capillary: 214 mg/dL — ABNORMAL HIGH (ref 70–99)
Glucose-Capillary: 89 mg/dL (ref 70–99)

## 2021-05-15 LAB — BASIC METABOLIC PANEL
Anion gap: 15 (ref 5–15)
BUN: 108 mg/dL — ABNORMAL HIGH (ref 6–20)
CO2: 25 mmol/L (ref 22–32)
Calcium: 8.9 mg/dL (ref 8.9–10.3)
Chloride: 97 mmol/L — ABNORMAL LOW (ref 98–111)
Creatinine, Ser: 8.99 mg/dL — ABNORMAL HIGH (ref 0.61–1.24)
GFR, Estimated: 6 mL/min — ABNORMAL LOW (ref >60)
Glucose, Bld: 142 mg/dL — ABNORMAL HIGH (ref 70–99)
Potassium: 4.9 mmol/L (ref 3.5–5.1)
Sodium: 137 mmol/L (ref 135–145)

## 2021-05-15 LAB — PHOSPHORUS: Phosphorus: 8.1 mg/dL — ABNORMAL HIGH (ref 2.5–4.6)

## 2021-05-15 LAB — MAGNESIUM: Magnesium: 2.2 mg/dL (ref 1.7–2.4)

## 2021-05-15 NOTE — Consult Note (Signed)
Electrophysiology Consultation:   Patient ID: [name] MRN: [id number]; DOB: Jul 25, 1966  Admit date: 05/08/2021 Date of Consult: 05/15/2021  PCP:  [facility]    Patient Profile:   [name] is a 55 y.o. male with a hx of ESRD on iHD, CAD w/ PCI to RCA, DM, morbid obesity, HTN, OSA on CPAP who is being seen 05/15/2021 for the evaluation of VF arrest at the request of Dr Saralyn Pilar. [name] suffered a VF arrest with 33mn downtime this admission. He has since been extubated. Today, I had a lengthy discussion with the patient re: ICD indications. He has several friends with an ICD and he tells me he is very much against having an ICD. I discussed the rational for the ICD in his situation but he is clear that he doesn't want an ICD.   Past Medical History:  Diagnosis Date  . Bell's palsy   . Bell's palsy   . Cataract   . CHF (congestive heart failure) ([facility]   . Chronic kidney disease    dialysis  . COPD (chronic obstructive pulmonary disease) ([facility]   . Coronary artery disease   . Diabetes mellitus without complication ([facility]   . Dyspnea    Wheezing  . GERD (gastroesophageal reflux disease)   . Gout   . HOH (hard of hearing)    mild  . Hypercholesterolemia   . Hypertension   . NSVT (nonsustained ventricular tachycardia) ([facility]   . Obstructive sleep apnea    CPAP, O2 use continuosly at 3LPM  . Orthopnea   . Oxygen dependent    3 lpm continuous  . Pneumonia    In Past X2 most recent 1 yr ago  . Psoriasis   . Psoriasis   . Pulmonary HTN ([place]   . Renal failure     Past Surgical History:  Procedure Laterality Date  . A/V FISTULAGRAM Right 05/02/2021   Procedure: A/V FISTULAGRAM;  Surgeon: SKatha Cabal MD;  [facility];  Service: Cardiovascular;  Laterality: Right;  . AV FISTULA PLACEMENT Right 11/20/2019   Procedure: ARTERIOVENOUS (AV) FISTULA CREATION ( BRACHIAL CEPHALIC );  Surgeon: SKatha Cabal MD;  Location:  [facility];  Service: Vascular;  Laterality: Right;  . CATARACT EXTRACTION W/PHACO Left 12/17/2019   Procedure: CATARACT EXTRACTION PHACO AND INTRAOCULAR LENS PLACEMENT ([place] LEFT ISTENT INJ DIABETIC;  Surgeon: KEulogio Bear MD;  Location: [facility];  Service: Ophthalmology;  Laterality: Left;  [place]00:33.2 CDE 2.96 Fluid Pack Lot # [id number]  . CATARACT EXTRACTION W/PHACO Right 02/24/2020   Procedure: CATARACT EXTRACTION PHACO AND INTRAOCULAR LENS PLACEMENT (IOC) RIGHT DIABETIC ISTENT INJ;  Surgeon: KEulogio Bear MD;  Location: [facility];  Service: Ophthalmology;  Laterality: Right;  [place]00:37.3 CDE 2.88 Fluid Pack Lot # [id number]  . CORONARY ANGIOPLASTY WITH STENT PLACEMENT     stent placement  . DIALYSIS/PERMA CATHETER INSERTION N/A 06/22/2019   Procedure: DIALYSIS/PERMA CATHETER INSERTION;  Surgeon: DAlgernon Huxley MD;  [facility];  Service: Cardiovascular;  Laterality: N/A;  . DIALYSIS/PERMA CATHETER REMOVAL N/A 05/31/2020   Procedure: DIALYSIS/PERMA CATHETER REMOVAL;  Surgeon: SKatha Cabal MD;  [facility];  Service: Cardiovascular;  Laterality: N/A;  . INSERTION OF AHMED VALVE Left 12/17/2019   Procedure: INSERTION OF iSTENT;  Surgeon: KEulogio Bear MD;  Location: [facility];  Service: Ophthalmology;  Laterality: Left;     Home Medications:  Prior to Admission medications  Medication Sig Start Date End Date Taking? Authorizing Provider  albuterol (PROVENTIL HFA;VENTOLIN HFA) 108 (90 Base) MCG/ACT inhaler Inhale 2 puffs into the lungs every 4 (four) hours as needed for wheezing or shortness of breath. 01/20/19   Doles-Johnson, Teah, NP  allopurinol (ZYLOPRIM) 100 MG tablet Take 200 mg by mouth in the morning. 09/23/20 09/23/21  [provider]  ANORO ELLIPTA 62.5-25 MCG/INH AEPB INHALE 1 PUFF INTO THE LUNGS DAILY. 11/30/19   Kendell Bane, NP  aspirin EC 81 MG tablet Take 81 mg by mouth in the morning. Swallow whole.    [provider]  atorvastatin (LIPITOR) 40 MG tablet Take 1 tablet (40 mg total) by mouth at bedtime. Patient taking differently: Take 40 mg by mouth in the morning. 03/21/20   Kendell Bane, NP  betamethasone dipropionate 0.05 % cream Apply topically as directed. Apply every AM for 2 weeks then decrease to every other day Patient not taking: No sig reported 10/04/20   Ralene Bathe, MD  calcipotriene (DOVONOX) 0.005 % cream Apply topically as directed. Apply every PM for two weeks then every other day alternating with the betamethasone Patient not taking: No sig reported 10/04/20   Ralene Bathe, MD  Calcipotriene-Betameth Diprop (ENSTILAR) 0.005-0.064 % FOAM Apply to skin qd-bid Patient not taking: No sig reported 09/22/20   Ralene Bathe, MD  calcium acetate (PHOSLO) 667 MG capsule Take 667 mg by mouth 3 (three) times daily with meals. 01/17/21   [provider]  carvedilol (COREG) 25 MG tablet Take 25 mg by mouth 2 (two) times daily. 04/11/21   [provider]  Cholecalciferol (D3-1000) 25 MCG (1000 UT) tablet Take 2 tablets (2,000 Units total) by mouth daily. Patient taking differently: Take 1,000 Units by mouth in the morning and at bedtime. 01/20/19   Doles-Johnson, Teah, NP  cyclobenzaprine (FLEXERIL) 5 MG tablet Take 1 tablet (5 mg total) by mouth 2 (two) times daily as needed for muscle spasms. 01/31/21   Nicole Kindred A, DO  diclofenac Sodium (VOLTAREN) 1 % GEL Apply 4 g topically 4 (four) times daily as needed. For Knee Pain Patient not taking: No sig reported 01/31/21   Nicole Kindred A, DO  Dulaglutide (TRULICITY) 1.5 EY/8.1KG SOPN Inject 1.5 sq every week Patient taking differently: Inject 1.5 mg into the skin every Monday. 08/30/20   Lavera Guise, MD  furosemide (LASIX) 80 MG tablet Take 80 mg by mouth daily. 04/11/21   [provider]  gabapentin (NEURONTIN) 300 MG capsule Take 300 mg by mouth in the morning. 01/17/21   [provider]   glimepiride (AMARYL) 4 MG tablet Take 4 mg by mouth in the morning. 11/27/19   [provider]  hydrALAZINE (APRESOLINE) 100 MG tablet Take 100 mg by mouth daily. Patient not taking: No sig reported 04/11/21   [provider]  insulin degludec (TRESIBA FLEXTOUCH) 100 UNIT/ML FlexTouch Pen Inject 20 Units into the skin at bedtime. 08/30/20   Lavera Guise, MD  isosorbide mononitrate (IMDUR) 60 MG 24 hr tablet Take 60 mg by mouth daily. 04/11/21   [provider]  magnesium oxide (MAG-OX) 400 MG tablet Take 400 mg by mouth 2 (two) times daily.    [provider]  Multiple Vitamins-Minerals (TAB-A-VITE) TABS TAKE 1 TABLET BY MOUTH ONCE DAILY. Patient taking differently: Take 1 tablet by mouth in the morning. 02/19/20   Kendell Bane, NP  omeprazole (PRILOSEC) 20 MG capsule Take 1 capsule (20  mg total) by mouth 2 (two) times daily before a meal. Patient taking differently: Take 20 mg by mouth in the morning. 02/19/20   Kendell Bane, NP  Risankizumab-rzaa (SKYRIZI) 150 MG/ML SOSY Inject 150 mg into the skin as directed. Every 12 weeks for maintenance. Patient taking differently: Inject 150 mg into the skin every 4 (four) months. 11/09/20   Ralene Bathe, MD  triamcinolone cream (KENALOG) 0.1 % Apply 1 application topically 2 (two) times daily. Patient not taking: No sig reported    [provider]  TRULICITY 3 DH/7.8XB SOPN Inject 1.5 mg into the skin once a week. 04/11/21   [provider]    Inpatient Medications: Scheduled Meds: . allopurinol  200 mg Oral q AM  . aspirin  81 mg Oral Daily  . atorvastatin  40 mg Oral q AM  . budesonide (PULMICORT) nebulizer solution  0.5 mg Nebulization BID  . chlorhexidine gluconate (MEDLINE KIT)  15 mL Mouth Rinse BID  . Chlorhexidine Gluconate Cloth  6 each Topical Q0600  . cholecalciferol  1,000 Units Oral Daily  . epoetin (EPOGEN/PROCRIT) injection  4,000 Units Intravenous Q M,W,F-HD  .  furosemide  80 mg Oral Daily  . heparin  5,000 Units Subcutaneous Q8H  . insulin aspart  0-15 Units Subcutaneous Q4H  . insulin glargine  15 Units Subcutaneous QHS  . ipratropium-albuterol  3 mL Nebulization Q6H  . mouth rinse  15 mL Mouth Rinse 10 times per day  . methylPREDNISolone (SOLU-MEDROL) injection  20 mg Intravenous BID  . multivitamin  1 tablet Oral QHS  . pantoprazole (PROTONIX) IV  40 mg Intravenous Q24H  . sodium chloride flush  3 mL Intravenous Q12H  . sodium chloride flush  3 mL Intravenous Q12H  . triamcinolone cream   Topical BID   Continuous Infusions: . sodium chloride    . sodium chloride    . sodium chloride    . sodium chloride    . sodium chloride     PRN Meds: sodium chloride, sodium chloride, sodium chloride, sodium chloride, acetaminophen, albuterol, alteplase, cyclobenzaprine, docusate, heparin, heparin, hydrALAZINE, lidocaine (PF), lidocaine-prilocaine, ondansetron (ZOFRAN) IV, pentafluoroprop-tetrafluoroeth, polyethylene glycol, sodium chloride flush, sodium chloride flush  Allergies:    Allergies  Allergen Reactions  . Shellfish Allergy Anaphylaxis    Face and throat swelling, difficulty breathing Allergy can be triggered by touching (contact)  . Other   . Betadine [Povidone Iodine] Rash  . Povidone-Iodine Rash    Social History:   Social History   Socioeconomic History  . Marital status: Single    Spouse name: Not on file  . Number of children: Not on file  . Years of education: Not on file  . Highest education level: Not on file  Occupational History    Comment: disabled  Tobacco Use  . Smoking status: Never Smoker  . Smokeless tobacco: Never Used  Vaping Use  . Vaping Use: Never used  Substance and Sexual Activity  . Alcohol use: Not Currently    Alcohol/week: 1.0 standard drink    Types: 1 Cans of beer per week    Comment: occasional  . Drug use: Not Currently    Types: Marijuana    Comment: in high school   . Sexual  activity: Not on file  Other Topics Concern  . Not on file  Social History Narrative   Patient uses continuous oxygen at 3L/min   Dialysis, AV fistula in L arm   Lives at home by himself ;  friend gives pt. Transportation    Social Determinants of Health   Financial Resource Strain: Not on file  Food Insecurity: Not on file  Transportation Needs: Not on file  Physical Activity: Not on file  Stress: Not on file  Social Connections: Not on file  Intimate Partner Violence: Not on file    Family History:    Family History  Problem Relation Age of Onset  . Heart failure Mother   . Kidney failure Brother      ROS:  Please see the history of present illness.   All other ROS reviewed and negative.     Physical Exam/Data:   Vitals:   05/15/21 1315 05/15/21 1330 05/15/21 1345 05/15/21 1400  BP: 121/77 123/77 117/83 (!) 131/110  Pulse: 72 74 81 75  Resp: 19 18 18 19   Temp:      TempSrc:      SpO2: 96% 95% 96% 95%  Weight:      Height:        Intake/Output Summary (Last 24 hours) at 05/15/2021 1406 Last data filed at 05/15/2021 0420 Gross per 24 hour  Intake 527.83 ml  Output 470 ml  Net 57.83 ml   Last 3 Weights 05/11/2021 05/10/2021 05/09/2021  Weight (lbs) 268 lb 15.4 oz 272 lb 7.8 oz 272 lb 14.9 oz  Weight (kg) 122 kg 123.6 kg 123.8 kg     Body mass index is 47.66 kg/m.  General:  Obese. On hd. In bed at 45 degrees. HEENT: normal Lymph: no adenopathy Neck: JVD elevated Endocrine:  No thryomegaly Vascular: No carotid bruits; FA pulses 2+ bilaterally without bruits  Cardiac:  Irregular rhythm. No murmur. Warm. Lungs:  clear to auscultation bilaterally, poor aeration at bases Abd: soft, nontender, no hepatomegaly. obese Ext: no edema Musculoskeletal:  No deformities, BUE and BLE strength normal and equal Skin: warm and dry  Neuro:  CNs 2-12 intact, no focal abnormalities noted Psych:  Normal affect   Telemetry:  Telemetry was personally reviewed and  demonstrates:  Sinus w PACs. Episode of NSVT yesterday (monomorphic)  Tele strip (on epic CV Strip) shows PMVT quickly degenerating into VF.   Relevant CV Studies:  05/10/2021 Echo (personally reviewed) EF 50% RV normal No significant valvular abnormalities  Laboratory Data:  High Sensitivity Troponin:   Recent Labs  Lab 05/08/21 1100 05/08/21 1903 05/08/21 2046 05/09/21 2233 05/10/21 0537  TROPONINIHS 143* 103* 86* 126* 208*     Chemistry Recent Labs  Lab 05/13/21 0418 05/14/21 0420 05/14/21 2233 05/15/21 0544  NA 138 137  --  137  K 5.5* 5.4* 5.0 4.9  CL 99 97*  --  97*  CO2 27 27  --  25  GLUCOSE 205* 191*  --  142*  BUN 79* 75*  --  108*  CREATININE 8.38* 7.39*  --  8.99*  CALCIUM 9.0 9.1  --  8.9  GFRNONAA 7* 8*  --  6*  ANIONGAP 12 13  --  15    No results for input(s): PROT, ALBUMIN, AST, ALT, ALKPHOS, BILITOT in the last 168 hours. Hematology Recent Labs  Lab 05/13/21 0418 05/14/21 0420 05/15/21 0544  WBC 12.9* 12.8* 15.7*  RBC 3.47* 3.54* 3.32*  HGB 9.9* 10.0* 9.5*  HCT 31.6* 32.3* 29.4*  MCV 91.1 91.2 88.6  MCH 28.5 28.2 28.6  MCHC 31.3 31.0 32.3  RDW 14.6 14.3 14.2  PLT 208 197 179   BNPNo results for input(s): BNP, PROBNP in the last 168 hours.  DDimer No results for input(s): DDIMER in the last 168 hours.   Radiology/Studies:  No results found.   Assessment and Plan:   1. VF/PMVT arrest In setting of ESRD and electrolyte abnormalities (hyperK). I do think he is a candidate for an ICD for secondary prevention. He is not a candidate for a traditional transvenous device because of esrd. I would recommend S-ICD implant. I have discussed the procedure in detail and the rational for implanting but he is not interested and cites multiple friends who have them. I would recommend continued discussions with the patient.  I do think it would be important to perform a LHC prior to discharge to exclude significant change in his CAD  burden.  If he continues to decline ICD during this hospitalization, I would recommend discharging with a LifeVest with early follow up in my clinic. Please contact us prior to discharge to confirm follow up is arranged.  2. CAD Recommend LHC prior to discharge.  3. ESRD on iHD    For questions or updates, please contact [place] Please consult [email or web address] for contact info under    Signed, Vickie Epley, MD  05/15/2021 2:06 PM

## 2021-05-15 NOTE — Progress Notes (Signed)
[facility] Cardiology  SUBJECTIVE: Extubated, patient arousable, denies chest pain or shortness of breath   Vitals:   05/15/21 0300 05/15/21 0400 05/15/21 0500 05/15/21 0600  BP: (!) 159/93 (!) 154/85 133/75 (!) 111/94  Pulse: 69 68 68 69  Resp:  (!) 22 15 17   Temp: 98.96 F (37.2 C) 98.6 F (37 C) 99.14 F (37.3 C) 98.6 F (37 C)  TempSrc:      SpO2: 95% 95% 95% 93%  Weight:      Height:         Intake/Output Summary (Last 24 hours) at 05/15/2021 0802 Last data filed at 05/15/2021 0420 Gross per 24 hour  Intake 530.83 ml  Output 470 ml  Net 60.83 ml      PHYSICAL EXAM  General: Well developed, well nourished, in no acute distress HEENT:  Normocephalic and atramatic Neck:  No JVD.  Lungs: Clear bilaterally to auscultation and percussion. Heart: HRRR . Normal S1 and S2 without gallops or murmurs.  Abdomen: Bowel sounds are positive, abdomen soft and non-tender  Msk:  Back normal, normal gait. Normal strength and tone for age. Extremities: No clubbing, cyanosis or edema.   Neuro: Alert and oriented X 3. Psych:  Good affect, responds appropriately   LABS: Basic Metabolic Panel: Recent Labs    05/14/21 0420 05/14/21 2233 05/15/21 0544  NA 137  --  137  K 5.4* 5.0 4.9  CL 97*  --  97*  CO2 27  --  25  GLUCOSE 191*  --  142*  BUN 75*  --  108*  CREATININE 7.39*  --  8.99*  CALCIUM 9.1  --  8.9  MG 2.0  --  2.2  PHOS 9.0*  --  8.1*   Liver Function Tests: No results for input(s): AST, ALT, ALKPHOS, BILITOT, PROT, ALBUMIN in the last 72 hours. No results for input(s): LIPASE, AMYLASE in the last 72 hours. CBC: Recent Labs    05/14/21 0420 05/15/21 0544  WBC 12.8* 15.7*  NEUTROABS 11.5* 13.7*  HGB 10.0* 9.5*  HCT 32.3* 29.4*  MCV 91.2 88.6  PLT 197 179   Cardiac Enzymes: No results for input(s): CKTOTAL, CKMB, CKMBINDEX, TROPONINI in the last 72 hours. BNP: Invalid input(s): POCBNP D-Dimer: No results for input(s): DDIMER in the last 72  hours. Hemoglobin A1C: No results for input(s): HGBA1C in the last 72 hours. Fasting Lipid Panel: No results for input(s): CHOL, HDL, LDLCALC, TRIG, CHOLHDL, LDLDIRECT in the last 72 hours. Thyroid Function Tests: No results for input(s): TSH, T4TOTAL, T3FREE, THYROIDAB in the last 72 hours.  Invalid input(s): FREET3 Anemia Panel: No results for input(s): VITAMINB12, FOLATE, FERRITIN, TIBC, IRON, RETICCTPCT in the last 72 hours.  No results found.   Echo LVEF 50 to 55%  TELEMETRY: Sinus rhythm:  ASSESSMENT AND PLAN:  Principal Problem:   Hyperkalemia Active Problems:   Essential hypertension   CAD (coronary artery disease)   Chronic gout of multiple sites   ESRD (end stage renal disease) on dialysis (HCC)   Chronic diastolic heart failure (HCC)   Acute on chronic respiratory failure with hypoxia (HCC)   COPD (chronic obstructive pulmonary disease) (HCC)   HLD (hyperlipidemia)   Fall at home, initial encounter   Acute metabolic encephalopathy   Uremia   Elevated troponin   Leukocytosis   Type II diabetes mellitus with renal manifestations ([place])    1. Cardiac arrest,polymorphic VT,occurred during dialysis,12 minutes of CPR,on ventilator, now extubated 2. Coronary artery disease,history of NSTEMI, status  post PCI and stent placement of RCA in remote past. Troponin borderline elevated (129- 143- 103- 86- 126- 208) 3.History of ischemic cardiomyopathy,echo shows normal left ventricular function with no regional wall motion abnormalities 4.ESRD on hemodialysis 5. Acute hypoxic respiratory distress, remains intubated 6. Severe encephalopathy  Recommendations  1.Agree with current therapy 2.Continue aspirin 3.Continue atorvastatin 4.Consider cardiac catheterization during this hospitalization.  Will discuss with patient when more awake. 5.  EP consult, to consider ICD for secondary prevention  Isaias Cowman, MD, PhD, [facility] 05/15/2021 8:02  AM

## 2021-05-15 NOTE — Progress Notes (Signed)
[facility]  PROGRESS NOTE   Subjective:   Patient is now extubated Being dialyzed now.  Tolerating treatment well.  Objective:  Vital signs in last 24 hours:  Temp:  [86.5 F (30.3 C)-99.68 F (37.6 C)] 86.5 F (30.3 C) (05/30 1215) Pulse Rate:  [60-91] 81 (05/30 1345) Resp:  [15-23] 19 (05/30 1315) BP: (111-168)/(74-125) 117/83 (05/30 1345) SpO2:  [88 %-100 %] 96 % (05/30 1345) FiO2 (%):  [40 %] 40 % (05/29 1600)  Weight change:  Filed Weights   05/09/21 0439 05/10/21 0436 05/11/21 0448  Weight: 123.8 kg 123.6 kg 122 kg    Intake/Output: I/O last 3 completed shifts: In: 530.8 [I.V.:530.8] Out: 820 [Urine:820]   Intake/Output this shift:  No intake/output data recorded.  Physical Exam: General:  No acute distress  Head:  Normocephalic, atraumatic. Moist oral mucosal membranes  Eyes:  Anicteric  Neck:  Supple  Lungs:   Clear to auscultation, normal effort  Heart:  S1S2 no rubs  Abdomen:   Soft, nontender, bowel sounds present  Extremities:  1+ peripheral edema.  Neurologic:  Awake, alert, following commands  Skin:  No lesions  Access:     Basic Metabolic Panel: Recent Labs  Lab 05/11/21 0534 05/12/21 0615 05/13/21 0418 05/14/21 0420 05/14/21 2233 05/15/21 0544  NA 138 136 138 137  --  137  K 5.8* 5.1 5.5* 5.4* 5.0 4.9  CL 100 97* 99 97*  --  97*  CO2 23 24 27 27   --  25  GLUCOSE 236* 247* 205* 191*  --  142*  BUN 95* 79* 79* 75*  --  108*  CREATININE 11.42* 8.98* 8.38* 7.39*  --  8.99*  CALCIUM 8.6* 8.9 9.0 9.1  --  8.9  MG 2.5* 2.1 2.0 2.0  --  2.2  PHOS 8.2* 7.6* 9.3* 9.0*  --  8.1*    Liver Function Tests: No results for input(s): AST, ALT, ALKPHOS, BILITOT, PROT, ALBUMIN in the last 168 hours. No results for input(s): LIPASE, AMYLASE in the last 168 hours. No results for input(s): AMMONIA in the last 168 hours.  CBC: Recent Labs  Lab 05/11/21 0534 05/12/21 0615 05/13/21 0418 05/14/21 0420 05/15/21 0544  WBC 10.7*  12.2* 12.9* 12.8* 15.7*  NEUTROABS 9.9* 10.9* 11.6* 11.5* 13.7*  HGB 9.4* 9.3* 9.9* 10.0* 9.5*  HCT 29.7* 29.0* 31.6* 32.3* 29.4*  MCV 89.2 88.7 91.1 91.2 88.6  PLT 198 211 208 197 179    Cardiac Enzymes: No results for input(s): CKTOTAL, CKMB, CKMBINDEX, TROPONINI in the last 168 hours.  BNP: Invalid input(s): POCBNP  CBG: Recent Labs  Lab 05/14/21 1947 05/14/21 2336 05/15/21 0352 05/15/21 0727 05/15/21 1124  GLUCAP 222* 190* 145* 110* 16    Microbiology: Results for orders placed or performed during the hospital encounter of 05/08/21  Resp Panel by RT-PCR (Flu A&B, Covid) Nasopharyngeal Swab     Status: None   Collection Time: 05/08/21  8:04 AM   Specimen: Nasopharyngeal Swab; Nasopharyngeal(NP) swabs in vial transport medium  Result Value Ref Range Status   SARS Coronavirus 2 by RT PCR NEGATIVE NEGATIVE Final    Comment: (NOTE) SARS-CoV-2 target nucleic acids are NOT DETECTED.  The SARS-CoV-2 RNA is generally detectable in upper respiratory specimens during the acute phase of infection. The lowest concentration of SARS-CoV-2 viral copies this assay can detect is 138 copies/mL. A negative result does not preclude SARS-Cov-2 infection and should not be used as the sole basis for treatment or other patient  management decisions. A negative result may occur with  improper specimen collection/handling, submission of specimen other than nasopharyngeal swab, presence of viral mutation(s) within the areas targeted by this assay, and inadequate number of viral copies(<138 copies/mL). A negative result must be combined with clinical observations, patient history, and epidemiological information. The expected result is Negative.  Fact Sheet for Patients:  EntrepreneurPulse.com.au  Fact Sheet for Healthcare Providers:  IncredibleEmployment.be  This test is no t yet approved or cleared by the [place] FDA and  has been authorized for  detection and/or diagnosis of SARS-CoV-2 by FDA under an Emergency Use Authorization (EUA). This EUA will remain  in effect (meaning this test can be used) for the duration of the COVID-19 declaration under Section 564(b)(1) of the Act, 21 U.S.C.section 360bbb-3(b)(1), unless the authorization is terminated  or revoked sooner.       Influenza A by PCR NEGATIVE NEGATIVE Final   Influenza B by PCR NEGATIVE NEGATIVE Final    Comment: (NOTE) The Xpert Xpress SARS-CoV-2/FLU/RSV plus assay is intended as an aid in the diagnosis of influenza from Nasopharyngeal swab specimens and should not be used as a sole basis for treatment. Nasal washings and aspirates are unacceptable for Xpert Xpress SARS-CoV-2/FLU/RSV testing.  Fact Sheet for Patients: EntrepreneurPulse.com.au  Fact Sheet for Healthcare Providers: IncredibleEmployment.be  This test is not yet approved or cleared by the [place] FDA and has been authorized for detection and/or diagnosis of SARS-CoV-2 by FDA under an Emergency Use Authorization (EUA). This EUA will remain in effect (meaning this test can be used) for the duration of the COVID-19 declaration under Section 564(b)(1) of the Act, 21 U.S.C. section 360bbb-3(b)(1), unless the authorization is terminated or revoked.  Performed at [facility], [address] [place], [place] [address]   Blood culture (single)     Status: None   Collection Time: 05/08/21  8:59 AM   Specimen: BLOOD  Result Value Ref Range Status   Specimen Description BLOOD RIGHT [facility]  Final   Special Requests   Final    BOTTLES DRAWN AEROBIC AND ANAEROBIC Blood Culture results may not be optimal due to an excessive volume of blood received in culture bottles   Culture   Final    NO GROWTH 5 DAYS Performed at [facility], [address] [place], [place] [address]    Report Status 05/13/2021 FINAL  Final  MRSA PCR Screening     Status:  None   Collection Time: 05/08/21  6:50 PM   Specimen: Nasopharyngeal  Result Value Ref Range Status   MRSA by PCR NEGATIVE NEGATIVE Final    Comment:        The GeneXpert MRSA Assay (FDA approved for NASAL specimens only), is one component of a comprehensive MRSA colonization surveillance program. It is not intended to diagnose MRSA infection nor to guide or monitor treatment for MRSA infections. Performed at [facility], [address] [place], [place] [address]   Culture, Respiratory w Gram Stain     Status: None   Collection Time: 05/09/21 12:16 AM   Specimen: Tracheal Aspirate; Respiratory  Result Value Ref Range Status   Specimen Description   Final    TRACHEAL ASPIRATE Performed at [facility], [address] [place], [place] [address]    Special Requests   Final    NONE Performed at [facility], [address] [place],  [address]    Gram Stain   Final  MODERATE WBC PRESENT,BOTH PMN AND MONONUCLEAR RARE GRAM POSITIVE COCCI IN PAIRS RARE GRAM VARIABLE ROD    Culture   Final    FEW Normal respiratory flora-no Staph aureus or Pseudomonas seen Performed at [facility], [address] [place], [place] [address]    Report Status 05/11/2021 FINAL  Final  Culture, Respiratory w Gram Stain     Status: None (Preliminary result)   Collection Time: 05/13/21  9:10 PM   Specimen: Tracheal Aspirate; Respiratory  Result Value Ref Range Status   Specimen Description   Final    TRACHEAL ASPIRATE Performed at [facility], [address] [place], [place] [address]    Special Requests   Final    NONE Performed at [facility], [place], [place] [address]    Gram Stain PENDING  Incomplete   Culture   Final    ABUNDANT GRAM NEGATIVE RODS ABUNDANT STENOTROPHOMONAS MALTOPHILIA    Report Status PENDING  Incomplete    Coagulation Studies: No results for input(s): LABPROT, INR in the last  72 hours.  Urinalysis: No results for input(s): COLORURINE, LABSPEC, PHURINE, GLUCOSEU, HGBUR, BILIRUBINUR, KETONESUR, PROTEINUR, UROBILINOGEN, NITRITE, LEUKOCYTESUR in the last 72 hours.  Invalid input(s): APPERANCEUR    Imaging: No results found.   Medications:   . sodium chloride    . sodium chloride    . sodium chloride    . sodium chloride    . sodium chloride     . allopurinol  200 mg Oral q AM  . aspirin  81 mg Oral Daily  . atorvastatin  40 mg Oral q AM  . budesonide (PULMICORT) nebulizer solution  0.5 mg Nebulization BID  . chlorhexidine gluconate (MEDLINE KIT)  15 mL Mouth Rinse BID  . Chlorhexidine Gluconate Cloth  6 each Topical Q0600  . cholecalciferol  1,000 Units Oral Daily  . epoetin (EPOGEN/PROCRIT) injection  4,000 Units Intravenous Q M,W,F-HD  . furosemide  80 mg Oral Daily  . heparin  5,000 Units Subcutaneous Q8H  . insulin aspart  0-15 Units Subcutaneous Q4H  . insulin glargine  15 Units Subcutaneous QHS  . ipratropium-albuterol  3 mL Nebulization Q6H  . mouth rinse  15 mL Mouth Rinse 10 times per day  . methylPREDNISolone (SOLU-MEDROL) injection  20 mg Intravenous BID  . multivitamin  1 tablet Oral QHS  . pantoprazole (PROTONIX) IV  40 mg Intravenous Q24H  . sodium chloride flush  3 mL Intravenous Q12H  . sodium chloride flush  3 mL Intravenous Q12H  . triamcinolone cream   Topical BID    Assessment/ Plan:     Principal Problem:   Hyperkalemia Active Problems:   Essential hypertension   CAD (coronary artery disease)   Chronic gout of multiple sites   ESRD (end stage renal disease) on dialysis (HCC)   Chronic diastolic heart failure (HCC)   Acute on chronic respiratory failure with hypoxia (HCC)   COPD (chronic obstructive pulmonary disease) (HCC)   HLD (hyperlipidemia)   Fall at home, initial encounter   Acute metabolic encephalopathy   Uremia   Elevated troponin   Leukocytosis   Type II diabetes mellitus with renal manifestations  ([place])  55 year old obese male with history of hypertension, coronary artery disease, congestive heart failure, obstructive sleep apnea, diabetes with peripheral vascular disease. He was admitted to the hospital with history of mental status changes.  Patient found to have V. Fib and had cardiac arrest.  Patient had successful dialysis .  Hyperkalemia: Dialysis with 2K bath.  Respiratory failure: Patient is now extubated.  ESRD: Tolerating dialysis well today.  Attempt to remove fluid as tolerated.  Coronary artery disease/cardiac arrest: Being evaluated by cardiology for possible cardiac cath.  He may also need EP evaluation for possible ICD placement.  We will continue to follow the patient closely during hospitalization.     LOS: [place], [facility] 5/30/20222:01 PM

## 2021-05-15 NOTE — Progress Notes (Signed)
NAME:  [name], MRN:  [id number], DOB:  1966/04/14, LOS: 7 ADMISSION DATE:  05/08/2021  55 y.o.malewith a history ofdCHF,ESRD on hemodialysis COPD,diabetes morbid obesity who presents to ER with severealtered mental status. Had a fall hitting the left side of his forehead yesterday.   Went to dialysis this morning where he was noted to be somnolent, poorly interactive, not able to follow commands. +headache.   Pt unable to complete dialysis today, per EMS pt had fall yesterday, at dialysis patient kept falling asleep, unable to determine baseline mentation due to being a new dialysis site. Pt with bruising and swelling present to left eye. Pt also complains of back pain and a headache.  Patient subsequently developed acute SOB and hypoxia failed biPAP and emergently intubated   05/12/21- patient is weaned on ventilator to Fio2 45%.  Holding OGF for SBT today.  He is awake RASS-0 during my evaluation. Labs reviewed with mild trend up of wbc hb stable 9.3. POC glucose 216. On unasyn day 5, blood cultures negative.   05/13/21- patient received HD.  He was unable to be weaned today. Plan is for SBT as soon as possible for liberation from MV>   05/14/21- patient is extubated to BIPAP.  He is lucid and expressed hunger asking for [employer] meal.  Family came to bedside appreciative of care.    05/15/21-patient is post extubation. He passed his SLP today. He had HD today.    Antibiotics Given (last 72 hours)    Date/Time Action Medication Dose Rate   05/12/21 2220 New Bag/Given   Ampicillin-Sulbactam (UNASYN) 3 g in sodium chloride 0.9 % 100 mL IVPB 3 g 200 mL/hr           Objective   Blood pressure (!) 111/94, pulse 69, temperature 98.6 F (37 C), resp. rate 17, height 5' 2.99" (1.6 m), weight 122 kg, SpO2 93 %.    Vent Mode: Spontaneous FiO2 (%):  [40 %] 40 % PEEP:  [5 cmH20] 5 cmH20 Pressure Support:  [5 cmH20] 5 cmH20   Intake/Output Summary (Last 24  hours) at 05/15/2021 1028 Last data filed at 05/15/2021 0420 Gross per 24 hour  Intake 527.83 ml  Output 470 ml  Net 57.83 ml   Filed Weights   05/09/21 0439 05/10/21 0436 05/11/21 0448  Weight: 123.8 kg 123.6 kg 122 kg      REVIEW OF SYSTEMS  10 point ros done and is negative except hunger   PHYSICAL EXAMINATION:  GENERAL:obese apathetic age appropirate HEAD: Normocephalic, atraumatic.  EYES: Pupils equal, round, reactive to light.  No scleral icterus.  MOUTH: Moist mucosal membrane. NECK: Supple. PULMONARY decreased air entry CARDIOVASCULAR: S1 and S2. Regular rate and rhythm. No murmurs, rubs, or gallops.  GASTROINTESTINAL: Soft, nontender, -distended. Positive bowel sounds.  MUSCULOSKELETAL: No swelling, clubbing, or edema.  NEUROLOGIC: lucid caml grossly no FND SKIN:intact,warm,dry   Labs/imaging that I havepersonally reviewed  (right click and "Reselect all SmartList Selections" daily)       ASSESSMENT AND PLAN SYNOPSIS  55 yo morbidly obese AAM with severe acute hypoxic respiratory failure post HD with aspiration pna    Severe ACUTE Hypoxic and Hypercapnic Respiratory Failure -continue Mechanical Ventilator support -continue Bronchodilator Therapy -Wean Fio2 and PEEP as tolerated -VAP/VENT bundle implementation Vent Mode: Spontaneous FiO2 (%):  [40 %] 40 % PEEP:  [5 cmH20] 5 cmH20 Pressure Support:  [5 cmH20] 5 cmH20   SEVERE COPD EXACERBATION -continue IV steroids as prescribed -continue NEB THERAPY as  prescribed   CARDIAC FAILURE-cardiac arrest +ischemic cardiomyopathy ECHO pending Cardiology consulted May consider [place] ICU monitoring   ESRD ON HD -continue Foley Catheter-assess need -Avoid nephrotoxic agents -Follow urine output, BMP -Ensure adequate renal perfusion, optimize oxygenation -Renal dose medications HD as needed tolerated  NEUROLOGY Acute toxic metabolic encephalopathy, need for sedation Goal RASS -2 to  -3   INFECTIOUS DISEASE Therapy for aspiration pneumonia RARE GRAM POSITIVE COCCI IN PAIRS  RARE GRAM VARIABLE ROD  -continue antibiotics as prescribed -follow up cultures   ENDO - ICU hypoglycemic\Hyperglycemia protocol -check FSBS per protocol   GI GI PROPHYLAXIS as indicated  NUTRITIONAL STATUS DIET-->TF's as tolerated Constipation protocol as indicated   ELECTROLYTES -follow labs as needed -replace as needed -pharmacy consultation and following     Best practice (right click and "Reselect all SmartList Selections" daily)  Diet:NPO Pain/Anxiety/Delirium protocol (if indicated):Yes(RASS goal -2) VAP protocol (if indicated):Yes DVT prophylaxis:Subcutaneous Heparin GI prophylaxis:H2B Glucose control:SSIYes Central venous access:N/A Arterial line:N/A Foley:N/A Mobility:bed rest PT consulted:N/A Code Status:full code Disposition:ICU      Labs   CBC: Recent Labs  Lab 05/11/21 0534 05/12/21 0615 05/13/21 0418 05/14/21 0420 05/15/21 0544  WBC 10.7* 12.2* 12.9* 12.8* 15.7*  NEUTROABS 9.9* 10.9* 11.6* 11.5* 13.7*  HGB 9.4* 9.3* 9.9* 10.0* 9.5*  HCT 29.7* 29.0* 31.6* 32.3* 29.4*  MCV 89.2 88.7 91.1 91.2 88.6  PLT 198 211 208 197 993    Basic Metabolic Panel: Recent Labs  Lab 05/11/21 0534 05/12/21 0615 05/13/21 0418 05/14/21 0420 05/14/21 2233 05/15/21 0544  NA 138 136 138 137  --  137  K 5.8* 5.1 5.5* 5.4* 5.0 4.9  CL 100 97* 99 97*  --  97*  CO2 23 24 27 27   --  25  GLUCOSE 236* 247* 205* 191*  --  142*  BUN 95* 79* 79* 75*  --  108*  CREATININE 11.42* 8.98* 8.38* 7.39*  --  8.99*  CALCIUM 8.6* 8.9 9.0 9.1  --  8.9  MG 2.5* 2.1 2.0 2.0  --  2.2  PHOS 8.2* 7.6* 9.3* 9.0*  --  8.1*   GFR: Estimated Creatinine Clearance: 11.3 mL/min (A) (by C-G formula based on SCr of 8.99 mg/dL (H)). Recent Labs  Lab 05/12/21 0615 05/13/21 0418 05/14/21 0420 05/15/21 0544  WBC 12.2* 12.9* 12.8* 15.7*    Liver Function  Tests: No results for input(s): AST, ALT, ALKPHOS, BILITOT, PROT, ALBUMIN in the last 168 hours. No results for input(s): LIPASE, AMYLASE in the last 168 hours. No results for input(s): AMMONIA in the last 168 hours.  ABG    Component Value Date/Time   PHART 7.31 (L) 05/15/2021 1012   PCO2ART 53 (H) 05/15/2021 1012   PO2ART 80 (L) 05/15/2021 1012   HCO3 26.7 05/15/2021 1012   TCO2 30 02/24/2020 0635   ACIDBASEDEF 0.1 05/15/2021 1012   O2SAT 94.5 05/15/2021 1012     Coagulation Profile: No results for input(s): INR, PROTIME in the last 168 hours.  Cardiac Enzymes: No results for input(s): CKTOTAL, CKMB, CKMBINDEX, TROPONINI in the last 168 hours.  HbA1C: Hgb A1c MFr Bld  Date/Time Value Ref Range Status  05/09/2021 04:53 AM 7.1 (H) 4.8 - 5.6 % Final    Comment:    (NOTE)         Prediabetes: 5.7 - 6.4         Diabetes: >6.4         Glycemic control for adults with diabetes: <7.0  01/28/2021 01:24 AM 5.6 4.8 - 5.6 % Final    Comment:    (NOTE) Pre diabetes:          5.7%-6.4%  Diabetes:              >6.4%  Glycemic control for   <7.0% adults with diabetes     CBG: Recent Labs  Lab 05/14/21 1635 05/14/21 1947 05/14/21 2336 05/15/21 0352 05/15/21 0727  GLUCAP 232* 222* 190* 145* 110*    Allergies Allergies  Allergen Reactions  . Shellfish Allergy Anaphylaxis    Face and throat swelling, difficulty breathing Allergy can be triggered by touching (contact)  . Other   . Betadine [Povidone Iodine] Rash  . Povidone-Iodine Rash       DVT/GI PRX  assessed I Assessed the need for Labs I Assessed the need for Foley I Assessed the need for Central Venous Line Family Discussion when available I Assessed the need for Mobilization I made an Assessment of medications to be adjusted accordingly Safety Risk assessment completed  [place] ICU TEAM    Critical care provider statement:    Critical care time (minutes):   33   Critical care time was exclusive of:  Separately billable procedures and  treating other patients   Critical care was necessary to treat or prevent imminent or  life-threatening deterioration of the following conditions:  acute hypoxemic respiratory failure, severe copd exacerbation , acute CHF , obesity, multiple comorbid conditions   Critical care was time spent personally by me on the following  activities:  Development of treatment plan with patient or surrogate,  discussions with consultants, evaluation of patient's response to  treatment, examination of patient, obtaining history from patient or  surrogate, ordering and performing treatments and interventions, ordering  and review of laboratory studies and re-evaluation of patient's condition   I assumed direction of critical care for this patient from another  provider in my specialty: no      Ottie Glazier, M.D.  Pulmonary & [place]

## 2021-05-15 NOTE — Evaluation (Signed)
Clinical/Bedside Swallow Evaluation Patient Details  Name: [name] MRN: [id number] Date of Birth: May 22, 1966  Today's Date: 05/15/2021 Time: SLP Start Time (ACUTE ONLY): 71 SLP Stop Time (ACUTE ONLY): 1605 SLP Time Calculation (min) (ACUTE ONLY): 60 min  Past Medical History:  Past Medical History:  Diagnosis Date  . Bell's palsy   . Bell's palsy   . Cataract   . CHF (congestive heart failure) ([place])   . Chronic kidney disease    dialysis  . COPD (chronic obstructive pulmonary disease) ([place])   . Coronary artery disease   . Diabetes mellitus without complication ([place])   . Dyspnea    Wheezing  . GERD (gastroesophageal reflux disease)   . Gout   . HOH (hard of hearing)    mild  . Hypercholesterolemia   . Hypertension   . NSVT (nonsustained ventricular tachycardia) ([place])   . Obstructive sleep apnea    CPAP, O2 use continuosly at 3LPM  . Orthopnea   . Oxygen dependent    3 lpm continuous  . Pneumonia    In Past X2 most recent 1 yr ago  . Psoriasis   . Psoriasis   . Pulmonary HTN ([place])   . Renal failure    Past Surgical History:  Past Surgical History:  Procedure Laterality Date  . A/V FISTULAGRAM Right 05/02/2021   Procedure: A/V FISTULAGRAM;  Surgeon: Katha Cabal, MD;  Location: [place] [facility];  Service: Cardiovascular;  Laterality: Right;  . AV FISTULA PLACEMENT Right 11/20/2019   Procedure: ARTERIOVENOUS (AV) FISTULA CREATION ( BRACHIAL CEPHALIC );  Surgeon: Katha Cabal, MD;  Location: [facility];  Service: Vascular;  Laterality: Right;  . CATARACT EXTRACTION W/PHACO Left 12/17/2019   Procedure: CATARACT EXTRACTION PHACO AND INTRAOCULAR LENS PLACEMENT ([place]) LEFT ISTENT INJ DIABETIC;  Surgeon: Eulogio Bear, MD;  Location: [facility];  Service: Ophthalmology;  Laterality: Left;  [place] 00:33.2 CDE 2.96 Fluid Pack Lot # [id number]  . CATARACT EXTRACTION W/PHACO Right 02/24/2020   Procedure: CATARACT EXTRACTION PHACO AND INTRAOCULAR LENS  PLACEMENT (IOC) RIGHT DIABETIC ISTENT INJ;  Surgeon: Eulogio Bear, MD;  Location: [facility];  Service: Ophthalmology;  Laterality: Right;  [place] 00:37.3 CDE 2.88 Fluid Pack Lot # [id number]  . CORONARY ANGIOPLASTY WITH STENT PLACEMENT     stent placement  . DIALYSIS/PERMA CATHETER INSERTION N/A 06/22/2019   Procedure: DIALYSIS/PERMA CATHETER INSERTION;  Surgeon: Algernon Huxley, MD;  Location: [facility];  Service: Cardiovascular;  Laterality: N/A;  . DIALYSIS/PERMA CATHETER REMOVAL N/A 05/31/2020   Procedure: DIALYSIS/PERMA CATHETER REMOVAL;  Surgeon: Katha Cabal, MD;  Location: [facility];  Service: Cardiovascular;  Laterality: N/A;  . INSERTION OF AHMED VALVE Left 12/17/2019   Procedure: INSERTION OF iSTENT;  Surgeon: Eulogio Bear, MD;  Location: [facility];  Service: Ophthalmology;  Laterality: Left;   HPI:  Pt is a 55 y.o. male with Multiple medical dxs and hx of Bell's Palsy affecting Left facial, ESRD on iHD, CAD w/ PCI to RCA, morbid Obesity, HTN, HLD, DM, COPD on 3-4L O2, dCHF, CAD, CAD, gout, presents with AMS, fall, CP, SOB.  Per ED notes, after dialysis, he became barely arousable.  He had oxygen desaturated to 86%.  Since patient could not protect his airway, he was intubated.  He was orally intubated for ~7 days.  Pt suffered a VF arrest with 15min downtime this admission. He has since been extubated(05/15/2019).  Cardiology had a lengthy discussion with the patient re:  ICD indications. He has several friends with an ICD and he is very much against having an ICD per MD note.  The patient is coming from home.  At baseline, pt is independent for most of ADL.  CXR at admit: Low lung volumes with increasing patchy bibasilar opacities.  Cardiomegaly with vascular congestion.   Assessment / Plan / Recommendation Clinical Impression  Pt had just finished w/ HD for ~4 hours. Pt appears to present w/ adequate oropharyngeal phase swallow w/ No gross, overt oropharyngeal  phase dysphagia noted, No overt neuromuscular deficits noted impacting oropharyngeal swallowing. Pt consumed po trials w/ No overt, clinical s/s of aspiration during po trials. Pt appears at reduced risk for aspiration following general aspiration precautions including Small bites/sips and Slowing Down when eating/drinking -- pt tended to eat/drink somewhat impulsively.   During po trials, pt consumed all consistencies w/ no overt coughing, decline in vocal quality, or change in respiratory presentation during/post trials. O2 sats remained 97%. Oral phase appeared [facility] w/ timely bolus management, mastication, and control of bolus propulsion for A-P transfer for swallowing. Oral clearing achieved w/ all trial consistencies. Pt exhibited min decreased awareness Overall during self-feeding and during oral prep/acceptance phase. OM Exam appeared grossly [facility] w/ no unilateral lingual weakness noted, but mild Left labial/facial weakness noted sec. to h/o Bell's Palsy on Left side per pt/chart. Pt was able to maintain adequate oral control/acceptance of bolus paying attention to the task during self-feeding.[name] Kitchen Speech Clear but w/ low volume. Pt fed self w/ FULL setup support. Pt was also weak in bilat. UEs during self-feeding (OT eval indicated).  Education given on Pills in Puree; food consistencies and easy to eat options; general aspiration precautions.   Recommend a more Regluar diet for ease of choice of manageable foods as pt wishes, Cut meats/moistened foods. Thin liquids via Cup/less straw use d/t air swallowing rec'd d/t GERD; general aspiration precautions. Rest Breaks during meals/oral intake to allow for Esophageal clearing. REFLUX precautions strongly recommended to lessen chance for Regurgitation. Recommend pt f/u w/ GI for management of Reflux and tx as indicated. Disucssion and handouts given on Reflux, general aspiration precautions(posted also). NSG updated, agreed   MD to reconsult ST services if any  new needs while admitted. SLP Visit Diagnosis: Dysphagia, unspecified (R13.10) (GERD)    Aspiration Risk   (reduced following precautions)    Diet Recommendation   Regluar diet for ease of choice of manageable foods as pt wishes, Cut meats/moistened foods. Thin liquids via Cup/less straw use d/t air swallowing rec'd d/t GERD; general aspiration precautions. Rest Breaks during meals/oral intake to allow for Esophageal clearing. REFLUX precautions strongly recommended to lessen chance for Regurgitation.   Medication Administration: Whole meds with liquid (or whole in puree if needed per NSG)    Other  Recommendations Recommended Consults: Consider GI evaluation (GERD, Dietician f/u for support) Oral Care Recommendations: Oral care BID;Oral care before and after PO;Staff/trained caregiver to provide oral care (assist) Other Recommendations:  (n/a)   Follow up Recommendations None      Frequency and Duration  (n/a)   (n/a)       Prognosis Prognosis for Safe Diet Advancement: Good Barriers to Reach Goals: Motivation;Behavior (awareness)      Swallow Study   General Date of Onset: 05/08/21 HPI: Pt is a 55 y.o. male with Multiple medical dxs and hx of Bell's Palsy affecting Left facial, ESRD on iHD, CAD w/ PCI to RCA, morbid Obesity, HTN, HLD, DM, COPD on 3-4L  O2, dCHF, CAD, CAD, gout, presents with AMS, fall, CP, SOB.  Per ED notes, after dialysis, he became barely arousable.  He had oxygen desaturated to 86%.  Since patient could not protect his airway, he was intubated.  He was orally intubated for ~7 days.  Pt suffered a VF arrest with 70min downtime this admission. He has since been extubated(05/15/2019).  Cardiology had a lengthy discussion with the patient re: ICD indications. He has several friends with an ICD and he is very much against having an ICD per MD note.  The patient is coming from home.  At baseline, pt is independent for most of ADL.  CXR at admit: Low lung volumes with  increasing patchy bibasilar opacities.  Cardiomegaly with vascular congestion. Type of Study: Bedside Swallow Evaluation Previous Swallow Assessment: none Diet Prior to this Study: NPO (regular at home prior per pt) Temperature Spikes Noted: No (wbc 15.7) Respiratory Status: Nasal cannula (8L) History of Recent Intubation: Yes Length of Intubations (days): 7 days Date extubated: 05/14/21 Behavior/Cognition: Alert;Cooperative;Pleasant mood;Distractible;Requires cueing (min) Oral Cavity Assessment: Dry (min) Oral Care Completed by SLP: Yes Oral Cavity - Dentition: Adequate natural dentition Vision: Functional for self-feeding Self-Feeding Abilities: Able to feed self;Needs assist;Needs set up (weak bilat. UEs) Patient Positioning: Upright in bed (needed positioning support) Baseline Vocal Quality: Low vocal intensity Volitional Cough: Strong Volitional Swallow: Able to elicit    Oral/Motor/Sensory Function Overall Oral Motor/Sensory Function: Mild impairment (h/o Bell's Palsy on left side) Facial ROM: Reduced left Facial Symmetry: Abnormal symmetry left Facial Strength: Within Functional Limits Lingual ROM: Within Functional Limits Lingual Symmetry: Within Functional Limits Lingual Strength: Within Functional Limits Velum: Within Functional Limits Mandible: Within Functional Limits   Ice Chips Ice chips: Within functional limits Presentation: Spoon (fed; 3 trials)   Thin Liquid Thin Liquid: Within functional limits Presentation: Cup;Self Fed;Straw (5 via Cup; ~3+ ozs via straw)    Nectar Thick Nectar Thick Liquid: Not tested   Honey Thick Honey Thick Liquid: Not tested   Puree Puree: Within functional limits Presentation: Self Fed;Spoon (4+ ozs)   Solid     Solid: Within functional limits Presentation: Self Fed (8+ trials)       Orinda Kenner, MS, CCC-SLP Speech Language Pathologist Rehab Services [phone] Jaise Moser 05/15/2021,4:13 PM

## 2021-05-16 LAB — BASIC METABOLIC PANEL
Anion gap: 14 (ref 5–15)
BUN: 86 mg/dL — ABNORMAL HIGH (ref 6–20)
CO2: 24 mmol/L (ref 22–32)
Calcium: 8.9 mg/dL (ref 8.9–10.3)
Chloride: 98 mmol/L (ref 98–111)
Creatinine, Ser: 7.04 mg/dL — ABNORMAL HIGH (ref 0.61–1.24)
GFR, Estimated: 9 mL/min — ABNORMAL LOW (ref >60)
Glucose, Bld: 225 mg/dL — ABNORMAL HIGH (ref 70–99)
Potassium: 5.6 mmol/L — ABNORMAL HIGH (ref 3.5–5.1)
Sodium: 136 mmol/L (ref 135–145)

## 2021-05-16 LAB — GLUCOSE, CAPILLARY
Glucose-Capillary: 198 mg/dL — ABNORMAL HIGH (ref 70–99)
Glucose-Capillary: 177 mg/dL — ABNORMAL HIGH (ref 70–99)
Glucose-Capillary: 287 mg/dL — ABNORMAL HIGH (ref 70–99)
Glucose-Capillary: 240 mg/dL — ABNORMAL HIGH (ref 70–99)
Glucose-Capillary: 167 mg/dL — ABNORMAL HIGH (ref 70–99)

## 2021-05-16 LAB — CBC WITH DIFFERENTIAL/PLATELET
Abs Immature Granulocytes: 0.12 10*3/uL — ABNORMAL HIGH (ref 0.00–0.07)
Basophils Absolute: 0 10*3/uL (ref 0.0–0.1)
Basophils Relative: 0 %
Eosinophils Absolute: 0 10*3/uL (ref 0.0–0.5)
Eosinophils Relative: 0 %
HCT: 33.1 % — ABNORMAL LOW (ref 39.0–52.0)
Hemoglobin: 10.7 g/dL — ABNORMAL LOW (ref 13.0–17.0)
Immature Granulocytes: 1 %
Lymphocytes Relative: 5 %
Lymphs Abs: 0.9 10*3/uL (ref 0.7–4.0)
MCH: 28.9 pg (ref 26.0–34.0)
MCHC: 32.3 g/dL (ref 30.0–36.0)
MCV: 89.5 fL (ref 80.0–100.0)
Monocytes Absolute: 0.4 10*3/uL (ref 0.1–1.0)
Monocytes Relative: 3 %
Neutro Abs: 15.3 10*3/uL — ABNORMAL HIGH (ref 1.7–7.7)
Neutrophils Relative %: 91 %
Platelets: 193 10*3/uL (ref 150–400)
RBC: 3.7 MIL/uL — ABNORMAL LOW (ref 4.22–5.81)
RDW: 14.2 % (ref 11.5–15.5)
WBC: 16.8 10*3/uL — ABNORMAL HIGH (ref 4.0–10.5)
nRBC: 0.1 % (ref 0.0–0.2)

## 2021-05-16 LAB — MAGNESIUM: Magnesium: 2 mg/dL (ref 1.7–2.4)

## 2021-05-16 LAB — PHOSPHORUS: Phosphorus: 7.9 mg/dL — ABNORMAL HIGH (ref 2.5–4.6)

## 2021-05-16 LAB — CULTURE, RESPIRATORY W GRAM STAIN

## 2021-05-16 MED ORDER — IPRATROPIUM-ALBUTEROL 0.5-2.5 (3) MG/3ML IN SOLN
3.0000 mL | Freq: Two times a day (BID) | RESPIRATORY_TRACT | Status: DC
  Administered 2021-05-17 – 2021-05-19 (×5): 3 mL via RESPIRATORY_TRACT
  Filled 2021-05-16 (×5): qty 3

## 2021-05-16 MED ORDER — PREDNISONE 20 MG PO TABS: 20.0000 mg | ORAL_TABLET | Freq: Every day | ORAL | Status: DC

## 2021-05-16 MED ORDER — PREDNISONE 10 MG PO TABS
50.0000 mg | ORAL_TABLET | Freq: Every day | ORAL | Status: AC
  Administered 2021-05-17: 50 mg via ORAL
  Filled 2021-05-16: qty 5

## 2021-05-16 MED ORDER — CALCIUM ACETATE (PHOS BINDER) 667 MG PO CAPS
667.0000 mg | ORAL_CAPSULE | Freq: Three times a day (TID) | ORAL | Status: DC
  Filled 2021-05-16 (×2): qty 1

## 2021-05-16 MED ORDER — CALCIUM ACETATE (PHOS BINDER) 667 MG PO CAPS
2001.0000 mg | ORAL_CAPSULE | Freq: Three times a day (TID) | ORAL | Status: DC
  Administered 2021-05-16 – 2021-05-19 (×9): 2001 mg via ORAL
  Filled 2021-05-16 (×11): qty 3

## 2021-05-16 MED ORDER — ENSURE MAX PROTEIN PO LIQD
11.0000 [oz_av] | Freq: Every day | ORAL | Status: DC
  Administered 2021-05-16 – 2021-05-17 (×2): 11 [oz_av] via ORAL
  Filled 2021-05-16: qty 330

## 2021-05-16 MED ORDER — PREDNISONE 20 MG PO TABS
30.0000 mg | ORAL_TABLET | Freq: Every day | ORAL | Status: AC
  Administered 2021-05-19: 30 mg via ORAL
  Filled 2021-05-16: qty 1

## 2021-05-16 MED ORDER — METOPROLOL SUCCINATE ER 25 MG PO TB24
25.0000 mg | ORAL_TABLET | Freq: Every day | ORAL | Status: DC
  Administered 2021-05-16 – 2021-05-18 (×3): 25 mg via ORAL
  Filled 2021-05-16 (×3): qty 1

## 2021-05-16 MED ORDER — PREDNISONE 10 MG PO TABS: 10.0000 mg | ORAL_TABLET | Freq: Every day | ORAL | Status: DC

## 2021-05-16 MED ORDER — PREDNISONE 20 MG PO TABS
40.0000 mg | ORAL_TABLET | Freq: Every day | ORAL | Status: AC
  Administered 2021-05-18: 40 mg via ORAL
  Filled 2021-05-16: qty 2

## 2021-05-16 NOTE — Progress Notes (Signed)
OT Cancellation Note  Patient Details Name: [name] MRN: [id number] DOB: [birth year]   Cancelled Treatment:    Reason Eval/Treat Not Completed: Active bedrest order. Order received and chart reviewed. Pt noted to have bed rest orders. Will continue to follow and initiate services as pt appropriate.   Dessie Coma, M.S. OTR/L  05/16/21, 4:16 PM  ascom [phone]

## 2021-05-16 NOTE — Progress Notes (Signed)
Nutrition Follow-up  DOCUMENTATION CODES:  Obesity unspecified  INTERVENTION:   Continue current diet as ordered, encouraged PO intake  Renavite daily  Restart phosphate binders to help regulate serum phosphorus levels  Ensure Max po 1x/d, each supplement provides 150 kcal and 30 grams of protein.   NUTRITION DIAGNOSIS:  Increased nutrient needs related to acute illness as evidenced by estimated needs.  GOAL:  Patient will meet greater than or equal to 90% of their needs  MONITOR:  PO intake,Labs,Weight trends  REASON FOR ASSESSMENT:  Ventilator    ASSESSMENT:  55 y/o male with h/o OSA, CMO, DM, HLD, HTN, CAD, ESRD on HD, CHF and COPD who is admitted with AMS, SOB, fall and hyperkalemia   Pt resting in bed at the time of assessment. Was able to be extubated 5/29 and oral intake appears at baseline. Weaned from BiPAP down to HFNC. Pt reported to RN that he enjoys eating. Will monitor intake trends for need for additional nutrition interventions, likely meeting needs with meals.   5/23 - Intubated, admitted to ICU 5/24 - OGT in place, EN initiated 5/29 - Extubated 5/30 - SLP evaluation, diet advanced  Average Meal Intake: . 5/30-5/31: 90% intake x 1 recorded meal  Nutritionally Relevant Medications Scheduled Meds: . atorvastatin  40 mg Oral q AM  . cholecalciferol  1,000 Units Oral Daily  . furosemide  80 mg Oral Daily  . insulin aspart  0-15 Units Subcutaneous Q4H  . insulin glargine  15 Units Subcutaneous QHS  . methylPREDNISolone (SOLU-MEDROL) injection  20 mg Intravenous BID  . multivitamin  1 tablet Oral QHS  . pantoprazole (PROTONIX) IV  40 mg Intravenous Q24H   PRN Meds: docusate, ondansetron, polyethylene glycol  Labs reviewed:  K 5.6  SBG ranges from 89-287 mg/dL over the last 24 hours  BUN 86, creatinine 7.04  Phosphorus 7.9  NUTRITION - FOCUSED PHYSICAL EXAM: Flowsheet Row Most Recent Value  Orbital Region No depletion  Upper Arm Region No  depletion  Thoracic and Lumbar Region No depletion  Buccal Region No depletion  Temple Region No depletion  Clavicle Bone Region No depletion  Clavicle and Acromion Bone Region No depletion  Scapular Bone Region No depletion  Dorsal Hand No depletion  Patellar Region No depletion  Anterior Thigh Region No depletion  Posterior Calf Region No depletion  Edema (RD Assessment) None  Hair Reviewed  Eyes Reviewed  Mouth Reviewed  Skin Reviewed  Nails Reviewed     Diet Order:   Diet Order            Diet heart healthy/carb modified Room service appropriate? Yes with Assist; Fluid consistency: Thin  Diet effective now                EDUCATION NEEDS:  No education needs have been identified at this time  Skin:  Skin Assessment: Reviewed RN Assessment (ecchymosis)  Last BM:  5/28 per RN documentation  Height:  Ht Readings from Last 1 Encounters:  05/13/21 5' 2.99" (1.6 m)    Weight:  Wt Readings from Last 1 Encounters:  05/16/21 112.4 kg    Ideal Body Weight:  56.3 kg  BMI:  Body mass index is 43.91 kg/m.  Estimated Nutritional Needs:   Kcal:  1800-2000 kcal/d  Protein:  90-100g/d  Fluid:  UOP+1L  Ranell Patrick, RD, LDN Clinical Dietitian Pager on Amion

## 2021-05-16 NOTE — Progress Notes (Signed)
[facility] Cardiology  SUBJECTIVE: Patient laying in bed, denies chest pain or shortness of breath   Vitals:   05/16/21 0402 05/16/21 0500 05/16/21 0600 05/16/21 0800  BP:  (!) 141/91 (!) 151/98 (!) 142/96  Pulse:  67 69 67  Resp:  (!) 32 20 17  Temp:  97.7 F (36.5 C)    TempSrc:  Rectal    SpO2:  97% 95% 98%  Weight: 112.4 kg     Height:         Intake/Output Summary (Last 24 hours) at 05/16/2021 0913 Last data filed at 05/16/2021 0415 Gross per 24 hour  Intake 240 ml  Output 3201 ml  Net -2961 ml      PHYSICAL EXAM  General: Well developed, well nourished, in no acute distress HEENT:  Normocephalic and atramatic Neck:  No JVD.  Lungs: Clear bilaterally to auscultation and percussion. Heart: HRRR . Normal S1 and S2 without gallops or murmurs.  Abdomen: Bowel sounds are positive, abdomen soft and non-tender  Msk:  Back normal, normal gait. Normal strength and tone for age. Extremities: No clubbing, cyanosis or edema.   Neuro: Alert and oriented X 3. Psych:  Good affect, responds appropriately   LABS: Basic Metabolic Panel: Recent Labs    05/15/21 0544 05/16/21 0432  NA 137 136  K 4.9 5.6*  CL 97* 98  CO2 25 24  GLUCOSE 142* 225*  BUN 108* 86*  CREATININE 8.99* 7.04*  CALCIUM 8.9 8.9  MG 2.2 2.0  PHOS 8.1* 7.9*   Liver Function Tests: No results for input(s): AST, ALT, ALKPHOS, BILITOT, PROT, ALBUMIN in the last 72 hours. No results for input(s): LIPASE, AMYLASE in the last 72 hours. CBC: Recent Labs    05/15/21 0544 05/16/21 0432  WBC 15.7* 16.8*  NEUTROABS 13.7* 15.3*  HGB 9.5* 10.7*  HCT 29.4* 33.1*  MCV 88.6 89.5  PLT 179 193   Cardiac Enzymes: No results for input(s): CKTOTAL, CKMB, CKMBINDEX, TROPONINI in the last 72 hours. BNP: Invalid input(s): POCBNP D-Dimer: No results for input(s): DDIMER in the last 72 hours. Hemoglobin A1C: No results for input(s): HGBA1C in the last 72 hours. Fasting Lipid Panel: No results for input(s): CHOL,  HDL, LDLCALC, TRIG, CHOLHDL, LDLDIRECT in the last 72 hours. Thyroid Function Tests: No results for input(s): TSH, T4TOTAL, T3FREE, THYROIDAB in the last 72 hours.  Invalid input(s): FREET3 Anemia Panel: No results for input(s): VITAMINB12, FOLATE, FERRITIN, TIBC, IRON, RETICCTPCT in the last 72 hours.  No results found.   Echo LVEF 50 to 55%  TELEMETRY: Sinus rhythm:  ASSESSMENT AND PLAN:  Principal Problem:   Hyperkalemia Active Problems:   Essential hypertension   CAD (coronary artery disease)   Chronic gout of multiple sites   ESRD (end stage renal disease) on dialysis (HCC)   Acute encephalopathy   Acute on chronic respiratory failure with hypoxia (HCC)   COPD (chronic obstructive pulmonary disease) (HCC)   HLD (hyperlipidemia)   Fall at home, initial encounter   Acute metabolic encephalopathy   Uremia   Elevated troponin   Leukocytosis   Type II diabetes mellitus with renal manifestations ([place])    1. Cardiac arrest,polymorphic VT, unknown etiology,occurred during dialysis,12 minutes of CPR,on ventilator, now extubated, currently denies chest pain or shortness of breath, refuses elective ICD, not interested and left heart cardiac catheterization 2. Coronary artery disease,history of NSTEMI, status post PCI and stent placement of RCA in remote past. Troponin borderline elevated (129- 143- 103- 86- 126- 208) 3.History  of ischemic cardiomyopathy,echo shows normal left ventricular function with no regional wall motion abnormalities 4.ESRD on hemodialysis 5. Acute hypoxic respiratory distress, remains intubated 6. Severe encephalopathy  Recommendations  1.Agree with current therapy 2.Continue aspirin 3.Continue atorvastatin 4.Add low-dose metoprolol succinate 25 mg daily 5.  Discharge home with LifeVest, follow-up with me in 1 week  Sign off for now, please call if any questions    [name] Cowman, MD, PhD, [facility] ([facility]) 05/16/2021 9:13  AM

## 2021-05-16 NOTE — Progress Notes (Signed)
NAME:  [name], MRN:  [id number], DOB:  1966/07/03, LOS: 8 ADMISSION DATE:  05/08/2021  55 y.o.malewith a history ofdCHF,ESRD on hemodialysis COPD,diabetes morbid obesity who presents to ER with severealtered mental status. Had a fall hitting the left side of his forehead yesterday.   Went to dialysis this morning where he was noted to be somnolent, poorly interactive, not able to follow commands. +headache.   Pt unable to complete dialysis today, per EMS pt had fall yesterday, at dialysis patient kept falling asleep, unable to determine baseline mentation due to being a new dialysis site. Pt with bruising and swelling present to left eye. Pt also complains of back pain and a headache.  Patient subsequently developed acute SOB and hypoxia failed biPAP and emergently intubated   05/12/21- patient is weaned on ventilator to Fio2 45%.  Holding OGF for SBT today.  He is awake RASS-0 during my evaluation. Labs reviewed with mild trend up of wbc hb stable 9.3. POC glucose 216. On unasyn day 5, blood cultures negative.   05/13/21- patient received HD.  He was unable to be weaned today. Plan is for SBT as soon as possible for liberation from MV>   05/14/21- patient is extubated to BIPAP.  He is lucid and expressed hunger asking for [employer] meal.  Family came to bedside appreciative of care.    05/15/21-patient is post extubation. He passed his SLP today. He had HD today.   05/16/21- patient improved, will transfer to step down unti and involve [place] service today- Epic patient placement request placed for [facility] pick up in am.  Plan for dialysis today and BIPAP.  Trach aspirate with colonization of stenotrophomonas and enterobacter as per ID phramacist therefore plan is to not expose patient to additional antimicrobials.        Objective   Blood pressure (!) 142/96, pulse 67, temperature 97.7 F (36.5 C), temperature source Rectal, resp. rate 17, height 5' 2.99" (1.6 m),  weight 112.4 kg, SpO2 98 %.    FiO2 (%):  [40 %-50 %] 50 %   Intake/Output Summary (Last 24 hours) at 05/16/2021 0835 Last data filed at 05/16/2021 0415 Gross per 24 hour  Intake 240 ml  Output 3201 ml  Net -2961 ml   Filed Weights   05/10/21 0436 05/11/21 0448 05/16/21 0402  Weight: 123.6 kg 122 kg 112.4 kg      REVIEW OF SYSTEMS  10 point ros done and is negative except hunger   PHYSICAL EXAMINATION:  GENERAL:obese apathetic age appropirate HEAD: Normocephalic, atraumatic.  EYES: Pupils equal, round, reactive to light.  No scleral icterus.  MOUTH: Moist mucosal membrane. NECK: Supple. PULMONARY decreased air entry CARDIOVASCULAR: S1 and S2. Regular rate and rhythm. No murmurs, rubs, or gallops.  GASTROINTESTINAL: Soft, nontender, -distended. Positive bowel sounds.  MUSCULOSKELETAL: No swelling, clubbing, or edema.  NEUROLOGIC: lucid caml grossly no FND SKIN:intact,warm,dry   Labs/imaging that I havepersonally reviewed  (right click and "Reselect all SmartList Selections" daily)       ASSESSMENT AND PLAN SYNOPSIS  55 yo morbidly obese AAM with severe acute hypoxic respiratory failure post HD with aspiration pna    Severe ACUTE Hypoxic and Hypercapnic Respiratory Failure -continue Mechanical Ventilator support -continue Bronchodilator Therapy -Wean Fio2 and PEEP as tolerated -VAP/VENT bundle implementation FiO2 (%):  [40 %-50 %] 50 % -finshed Unasyn - 5 days   Tracheal colonization with Stenotrophomonas Enterobacter    - will hold treatment due to likely colonization   SEVERE COPD  EXACERBATION -continue IV steroids as prescribed -continue NEB THERAPY as prescribed   CARDIAC FAILURE-cardiac arrest +ischemic cardiomyopathy ECHO pending Cardiology consulted May consider [place] ICU monitoring   ESRD ON HD -continue Foley Catheter-assess need -Avoid nephrotoxic agents -Follow urine output, BMP -Ensure adequate renal perfusion, optimize  oxygenation -Renal dose medications HD as needed tolerated  NEUROLOGY Acute toxic metabolic encephalopathy, need for sedation Goal RASS -2 to -3   INFECTIOUS DISEASE Therapy for aspiration pneumonia RARE GRAM POSITIVE COCCI IN PAIRS  RARE GRAM VARIABLE ROD  -continue antibiotics as prescribed -follow up cultures   ENDO - ICU hypoglycemic\Hyperglycemia protocol -check FSBS per protocol   GI GI PROPHYLAXIS as indicated  NUTRITIONAL STATUS DIET-->TF's as tolerated Constipation protocol as indicated   ELECTROLYTES -follow labs as needed -replace as needed -pharmacy consultation and following     Best practice (right click and "Reselect all SmartList Selections" daily)  Diet:NPO Pain/Anxiety/Delirium protocol (if indicated):Yes(RASS goal -2) VAP protocol (if indicated):Yes DVT prophylaxis:Subcutaneous Heparin GI prophylaxis:H2B Glucose control:SSIYes Central venous access:N/A Arterial line:N/A Foley:N/A Mobility:bed rest PT consulted:N/A Code Status:full code Disposition:ICU      Labs   CBC: Recent Labs  Lab 05/12/21 0615 05/13/21 0418 05/14/21 0420 05/15/21 0544 05/16/21 0432  WBC 12.2* 12.9* 12.8* 15.7* 16.8*  NEUTROABS 10.9* 11.6* 11.5* 13.7* 15.3*  HGB 9.3* 9.9* 10.0* 9.5* 10.7*  HCT 29.0* 31.6* 32.3* 29.4* 33.1*  MCV 88.7 91.1 91.2 88.6 89.5  PLT 211 208 197 179 825    Basic Metabolic Panel: Recent Labs  Lab 05/12/21 0615 05/13/21 0418 05/14/21 0420 05/14/21 2233 05/15/21 0544 05/16/21 0432  NA 136 138 137  --  137 136  K 5.1 5.5* 5.4* 5.0 4.9 5.6*  CL 97* 99 97*  --  97* 98  CO2 24 27 27   --  25 24  GLUCOSE 247* 205* 191*  --  142* 225*  BUN 79* 79* 75*  --  108* 86*  CREATININE 8.98* 8.38* 7.39*  --  8.99* 7.04*  CALCIUM 8.9 9.0 9.1  --  8.9 8.9  MG 2.1 2.0 2.0  --  2.2 2.0  PHOS 7.6* 9.3* 9.0*  --  8.1* 7.9*   GFR: Estimated Creatinine Clearance: 13.7 mL/min (A) (by C-G formula based on SCr of  7.04 mg/dL (H)). Recent Labs  Lab 05/13/21 0418 05/14/21 0420 05/15/21 0544 05/16/21 0432  WBC 12.9* 12.8* 15.7* 16.8*    Liver Function Tests: No results for input(s): AST, ALT, ALKPHOS, BILITOT, PROT, ALBUMIN in the last 168 hours. No results for input(s): LIPASE, AMYLASE in the last 168 hours. No results for input(s): AMMONIA in the last 168 hours.  ABG    Component Value Date/Time   PHART 7.39 05/15/2021 1547   PCO2ART 50 (H) 05/15/2021 1547   PO2ART 90 05/15/2021 1547   HCO3 30.3 (H) 05/15/2021 1547   TCO2 30 02/24/2020 0635   ACIDBASEDEF 0.1 05/15/2021 1012   O2SAT 96.9 05/15/2021 1547     Coagulation Profile: No results for input(s): INR, PROTIME in the last 168 hours.  Cardiac Enzymes: No results for input(s): CKTOTAL, CKMB, CKMBINDEX, TROPONINI in the last 168 hours.  HbA1C: Hgb A1c MFr Bld  Date/Time Value Ref Range Status  05/09/2021 04:53 AM 7.1 (H) 4.8 - 5.6 % Final    Comment:    (NOTE)         Prediabetes: 5.7 - 6.4         Diabetes: >6.4  Glycemic control for adults with diabetes: <7.0   01/28/2021 01:24 AM 5.6 4.8 - 5.6 % Final    Comment:    (NOTE) Pre diabetes:          5.7%-6.4%  Diabetes:              >6.4%  Glycemic control for   <7.0% adults with diabetes     CBG: Recent Labs  Lab 05/15/21 1612 05/15/21 1923 05/15/21 2348 05/16/21 0348 05/16/21 0753  GLUCAP 146* 214* 145* 198* 177*    Allergies Allergies  Allergen Reactions  . Shellfish Allergy Anaphylaxis    Face and throat swelling, difficulty breathing Allergy can be triggered by touching (contact)  . Other   . Betadine [Povidone Iodine] Rash  . Povidone-Iodine Rash       DVT/GI PRX  assessed I Assessed the need for Labs I Assessed the need for Foley I Assessed the need for Central Venous Line Family Discussion when available I Assessed the need for Mobilization I made an Assessment of medications to be adjusted accordingly Safety Risk assessment  completed  [facility] ICU TEAM    Critical care provider statement:    Critical care time (minutes):  33   Critical care time was exclusive of:  Separately billable procedures and  treating other patients   Critical care was necessary to treat or prevent imminent or  life-threatening deterioration of the following conditions:  acute hypoxemic respiratory failure, severe copd exacerbation , acute CHF , obesity, multiple comorbid conditions   Critical care was time spent personally by me on the following  activities:  Development of treatment plan with patient or surrogate,  discussions with consultants, evaluation of patient's response to  treatment, examination of patient, obtaining history from patient or  surrogate, ordering and performing treatments and interventions, ordering  and review of laboratory studies and re-evaluation of patient's condition   I assumed direction of critical care for this patient from another  provider in my specialty: no      Ottie Glazier, M.D.  [facility] [place]

## 2021-05-16 NOTE — Progress Notes (Signed)
     Referral received for [name] :goals of care discussion. Chart reviewed and updates received from RN. Patient assessed and has recently been placed on BiPAP and is resting at his request.   Nurse aware I will check back later.   PMT will re-attempt to engage with patient at a later time/date. Detailed note and recommendations to follow once GOC has been completed.   Thank you for your referral and allowing PMT to assist in [name]'s care.   Alda Lea, AGPCNP-BC Palliative Medicine Team  Phone: [phone] Pager: [phone] Amion: N. Cousar   NO CHARGE

## 2021-05-16 NOTE — Progress Notes (Signed)
Chaplain Maggie followed up with patient to reconnect after initially meeting him during a code blue on 05/09/21. Chaplain shared insight into the powerful witness of meeting his family and praying for him on the evening of the code. Patient appreciated hearing how the medical team rallied on his behalf and of the strength of his children who stood alongside him. Chaplain available for continued support as needed and will follow up.

## 2021-05-16 NOTE — Progress Notes (Signed)
PT Cancellation Note  Patient Details Name: [name] MRN: [id number] DOB: 1966-05-22   Cancelled Treatment:    Reason Eval/Treat Not Completed: Other (comment). Consult received and chart reviewed. Patient noted with critically elevated K+ (5.6) , per PT practice guidelines contraindicated for exertional activity at this time.  Will continue efforts next date pending medical stability and appropriateness.    Lieutenant Diego PT, DPT 12:35 PM,05/16/21

## 2021-05-16 NOTE — Progress Notes (Signed)
SLP Cancellation Note  Patient Details Name: [name] MRN: [id number] DOB: 02/24/1966   Cancelled treatment:       Reason Eval/Treat Not Completed: SLP screened, no needs identified, will sign off (chart reviewed; consulted NSG). Pt is tolerating his current diet w/ aspiration precautions recommended post BSE yesterday w/ No overt s/s of aspiration noted; no swallowing deficits noted by NSG or reported by pt.  ST services will sign off at this time w/ NSG to reconsult if new needs arise during admission.       Orinda Kenner, MS, CCC-SLP Speech Language Pathologist Rehab Services [phone] [facility] 05/16/2021, 1:14 PM

## 2021-05-16 NOTE — Progress Notes (Signed)
[facility]  ROUNDING NOTE   Subjective:   Extubated 5/29.   Hemodialysis treatment yesterday. UF of 2.5.   Objective:  Vital signs in last 24 hours:  Temp:  [86.5 F (30.3 C)-99.3 F (37.4 C)] 97.7 F (36.5 C) (05/31 0500) Pulse Rate:  [27-87] 67 (05/31 0800) Resp:  [15-32] 17 (05/31 0800) BP: (114-168)/(72-125) 142/96 (05/31 0800) SpO2:  [91 %-100 %] 98 % (05/31 0800) FiO2 (%):  [40 %-50 %] 50 % (05/31 0500) Weight:  [112.4 kg] 112.4 kg (05/31 0402)  Weight change:  Filed Weights   05/10/21 0436 05/11/21 0448 05/16/21 0402  Weight: 123.6 kg 122 kg 112.4 kg    Intake/Output: I/O last 3 completed shifts: In: 252 [P.O.:240; I.V.:12] Out: 3596 [Urine:1095; Other:2501]   Intake/Output this shift:  No intake/output data recorded.  Physical Exam: General: Sitting up in bed  Head: Normocephalic, atraumatic. Moist oral mucosal membranes  Eyes: Anicteric, PERRL  Neck: Supple, trachea midline  Lungs:  Diminished bilaterally, HFNC O2 8L  Heart: Regular rate and rhythm  Abdomen:  Soft, nontender, obese  Extremities: no peripheral edema.  Neurologic: Nonfocal, moving all four extremities  Skin: No lesions  Access: Right AVF    Basic Metabolic Panel: Recent Labs  Lab 05/12/21 0615 05/13/21 0418 05/14/21 0420 05/14/21 2233 05/15/21 0544 05/16/21 0432  NA 136 138 137  --  137 136  K 5.1 5.5* 5.4* 5.0 4.9 5.6*  CL 97* 99 97*  --  97* 98  CO2 24 27 27   --  25 24  GLUCOSE 247* 205* 191*  --  142* 225*  BUN 79* 79* 75*  --  108* 86*  CREATININE 8.98* 8.38* 7.39*  --  8.99* 7.04*  CALCIUM 8.9 9.0 9.1  --  8.9 8.9  MG 2.1 2.0 2.0  --  2.2 2.0  PHOS 7.6* 9.3* 9.0*  --  8.1* 7.9*    Liver Function Tests: No results for input(s): AST, ALT, ALKPHOS, BILITOT, PROT, ALBUMIN in the last 168 hours. No results for input(s): LIPASE, AMYLASE in the last 168 hours. No results for input(s): AMMONIA in the last 168 hours.  CBC: Recent Labs  Lab 05/12/21 0615  05/13/21 0418 05/14/21 0420 05/15/21 0544 05/16/21 0432  WBC 12.2* 12.9* 12.8* 15.7* 16.8*  NEUTROABS 10.9* 11.6* 11.5* 13.7* 15.3*  HGB 9.3* 9.9* 10.0* 9.5* 10.7*  HCT 29.0* 31.6* 32.3* 29.4* 33.1*  MCV 88.7 91.1 91.2 88.6 89.5  PLT 211 208 197 179 193    Cardiac Enzymes: No results for input(s): CKTOTAL, CKMB, CKMBINDEX, TROPONINI in the last 168 hours.  BNP: Invalid input(s): POCBNP  CBG: Recent Labs  Lab 05/15/21 1612 05/15/21 1923 05/15/21 2348 05/16/21 0348 05/16/21 0753  GLUCAP 146* 214* 145* 198* 177*    Microbiology: Results for orders placed or performed during the hospital encounter of 05/08/21  Resp Panel by RT-PCR (Flu A&B, Covid) Nasopharyngeal Swab     Status: None   Collection Time: 05/08/21  8:04 AM   Specimen: Nasopharyngeal Swab; Nasopharyngeal(NP) swabs in vial transport medium  Result Value Ref Range Status   SARS Coronavirus 2 by RT PCR NEGATIVE NEGATIVE Final    Comment: (NOTE) SARS-CoV-2 target nucleic acids are NOT DETECTED.  The SARS-CoV-2 RNA is generally detectable in upper respiratory specimens during the acute phase of infection. The lowest concentration of SARS-CoV-2 viral copies this assay can detect is 138 copies/mL. A negative result does not preclude SARS-Cov-2 infection and should not be used as the sole  basis for treatment or other patient management decisions. A negative result may occur with  improper specimen collection/handling, submission of specimen other than nasopharyngeal swab, presence of viral mutation(s) within the areas targeted by this assay, and inadequate number of viral copies(<138 copies/mL). A negative result must be combined with clinical observations, patient history, and epidemiological information. The expected result is Negative.  Fact Sheet for Patients:  EntrepreneurPulse.com.au  Fact Sheet for Healthcare Providers:  IncredibleEmployment.be  This test is no t  yet approved or cleared by the [place] FDA and  has been authorized for detection and/or diagnosis of SARS-CoV-2 by FDA under an Emergency Use Authorization (EUA). This EUA will remain  in effect (meaning this test can be used) for the duration of the COVID-19 declaration under Section 564(b)(1) of the Act, 21 U.S.C.section 360bbb-3(b)(1), unless the authorization is terminated  or revoked sooner.       Influenza A by PCR NEGATIVE NEGATIVE Final   Influenza B by PCR NEGATIVE NEGATIVE Final    Comment: (NOTE) The Xpert Xpress SARS-CoV-2/FLU/RSV plus assay is intended as an aid in the diagnosis of influenza from Nasopharyngeal swab specimens and should not be used as a sole basis for treatment. Nasal washings and aspirates are unacceptable for Xpert Xpress SARS-CoV-2/FLU/RSV testing.  Fact Sheet for Patients: EntrepreneurPulse.com.au  Fact Sheet for Healthcare Providers: IncredibleEmployment.be  This test is not yet approved or cleared by the [place] FDA and has been authorized for detection and/or diagnosis of SARS-CoV-2 by FDA under an Emergency Use Authorization (EUA). This EUA will remain in effect (meaning this test can be used) for the duration of the COVID-19 declaration under Section 564(b)(1) of the Act, 21 U.S.C. section 360bbb-3(b)(1), unless the authorization is terminated or revoked.  Performed at [facility], [address] [place], [place] [address]   Blood culture (single)     Status: None   Collection Time: 05/08/21  8:59 AM   Specimen: BLOOD  Result Value Ref Range Status   Specimen Description BLOOD RIGHT [facility]  Final   Special Requests   Final    BOTTLES DRAWN AEROBIC AND ANAEROBIC Blood Culture results may not be optimal due to an excessive volume of blood received in culture bottles   Culture   Final    NO GROWTH 5 DAYS Performed at [facility], [address] [place], [place]  [address]    Report Status 05/13/2021 FINAL  Final  MRSA PCR Screening     Status: None   Collection Time: 05/08/21  6:50 PM   Specimen: Nasopharyngeal  Result Value Ref Range Status   MRSA by PCR NEGATIVE NEGATIVE Final    Comment:        The GeneXpert MRSA Assay (FDA approved for NASAL specimens only), is one component of a comprehensive MRSA colonization surveillance program. It is not intended to diagnose MRSA infection nor to guide or monitor treatment for MRSA infections. Performed at [facility], [address] [place], [place] [address]   Culture, Respiratory w Gram Stain     Status: None   Collection Time: 05/09/21 12:16 AM   Specimen: Tracheal Aspirate; Respiratory  Result Value Ref Range Status   Specimen Description   Final    TRACHEAL ASPIRATE Performed at [facility], [address], [place], [place] [address]    Special Requests   Final    NONE Performed at [facility], [address] [place],  [address]    Gram Stain  Final    MODERATE WBC PRESENT,BOTH PMN AND MONONUCLEAR RARE GRAM POSITIVE COCCI IN PAIRS RARE GRAM VARIABLE ROD    Culture   Final    FEW Normal respiratory flora-no Staph aureus or Pseudomonas seen Performed at [facility], [address], [place], [place] [address]    Report Status 05/11/2021 FINAL  Final  Culture, Respiratory w Gram Stain     Status: None (Preliminary result)   Collection Time: 05/13/21  9:10 PM   Specimen: Tracheal Aspirate; Respiratory  Result Value Ref Range Status   Specimen Description   Final    TRACHEAL ASPIRATE Performed at [facility], [address], [place], [place] [address]    Special Requests   Final    NONE Performed at [facility], [address] [place], [place] [address]    Gram Stain   Final    FEW WBC PRESENT, PREDOMINANTLY PMN MODERATE GRAM NEGATIVE RODS Performed at [facility], [place] [address], [place], [place]  [address]    Culture   Final    Emmit [place] NEGATIVE RODS ABUNDANT STENOTROPHOMONAS MALTOPHILIA    Report Status PENDING  Incomplete    Coagulation Studies: No results for input(s): LABPROT, INR in the last 72 hours.  Urinalysis: No results for input(s): COLORURINE, LABSPEC, PHURINE, GLUCOSEU, HGBUR, BILIRUBINUR, KETONESUR, PROTEINUR, UROBILINOGEN, NITRITE, LEUKOCYTESUR in the last 72 hours.  Invalid input(s): APPERANCEUR    Imaging: No results found.   Medications:   . sodium chloride    . sodium chloride    . sodium chloride    . sodium chloride    . sodium chloride     . allopurinol  200 mg Oral q AM  . aspirin  81 mg Oral Daily  . atorvastatin  40 mg Oral q AM  . budesonide (PULMICORT) nebulizer solution  0.5 mg Nebulization BID  . chlorhexidine gluconate (MEDLINE KIT)  15 mL Mouth Rinse BID  . Chlorhexidine Gluconate Cloth  6 each Topical Q0600  . cholecalciferol  1,000 Units Oral Daily  . epoetin (EPOGEN/PROCRIT) injection  4,000 Units Intravenous Q M,W,F-HD  . furosemide  80 mg Oral Daily  . heparin  5,000 Units Subcutaneous Q8H  . insulin aspart  0-15 Units Subcutaneous Q4H  . insulin glargine  15 Units Subcutaneous QHS  . ipratropium-albuterol  3 mL Nebulization Q6H  . methylPREDNISolone (SOLU-MEDROL) injection  20 mg Intravenous BID  . multivitamin  1 tablet Oral QHS  . pantoprazole (PROTONIX) IV  40 mg Intravenous Q24H  . sodium chloride flush  3 mL Intravenous Q12H  . triamcinolone cream   Topical BID   sodium chloride, sodium chloride, sodium chloride, sodium chloride, acetaminophen, albuterol, alteplase, cyclobenzaprine, docusate, heparin, heparin, hydrALAZINE, lidocaine (PF), lidocaine-prilocaine, ondansetron (ZOFRAN) IV, pentafluoroprop-tetrafluoroeth, polyethylene glycol, sodium chloride flush  Assessment/ Plan:  [name] is a 55 y.o. black male with end stage renal disease on hemodialysis, hypertension, diabetes mellitus type II  insulin dependent, pulmonary hypertension, sleep apnea, hyperlipidemia, GERD, gout, coronary artery disease, congestive heart failure, psoariatic arthritis who is admitted to [facility] on 05[facility]08[facility]2021 for Hyperkalemia [E87.5] Stupor [R40.1] Acute respiratory failure with hypoxia ([place]) [J96.01] Chronic respiratory failure with hypoxia ([place]) [J96.11] ESRD on hemodialysis ([place]) [N18.6, Z99.2] Type 2 diabetes mellitus without complication, with long-term current use of insulin ([place]) [E11.9, Z79.4] Chronic congestive heart failure, unspecified heart failure type ([place]) [I50.9]  [facility] MWF [place]. Right AVF 120kg  1. End Stage Renal Disease: hemodialysis treatment  yesterday. Next treatment for tomorrow.   2. Hypertension: blood pressure elevated, 145/91 - steroid driven - furosemide.   3. Anemia of chronic kidney disease: hemoglobin 10.7. EPO with HD treatments.   4. Secondary Hyperparathyroidism: with hyperphosphatemia.  - calcium acetate.    LOS: 8 Brenlynn Fake 5/31/20229:03 AM

## 2021-05-17 DIAGNOSIS — E875 Hyperkalemia: Secondary | ICD-10-CM | POA: Diagnosis not present

## 2021-05-17 LAB — GLUCOSE, CAPILLARY
Glucose-Capillary: 217 mg/dL — ABNORMAL HIGH (ref 70–99)
Glucose-Capillary: 145 mg/dL — ABNORMAL HIGH (ref 70–99)
Glucose-Capillary: 114 mg/dL — ABNORMAL HIGH (ref 70–99)
Glucose-Capillary: 159 mg/dL — ABNORMAL HIGH (ref 70–99)
Glucose-Capillary: 330 mg/dL — ABNORMAL HIGH (ref 70–99)
Glucose-Capillary: 382 mg/dL — ABNORMAL HIGH (ref 70–99)

## 2021-05-17 LAB — CBC WITH DIFFERENTIAL/PLATELET
Abs Immature Granulocytes: 0.11 10*3/uL — ABNORMAL HIGH (ref 0.00–0.07)
Basophils Absolute: 0 10*3/uL (ref 0.0–0.1)
Basophils Relative: 0 %
Eosinophils Absolute: 0.5 10*3/uL (ref 0.0–0.5)
Eosinophils Relative: 3 %
HCT: 31.4 % — ABNORMAL LOW (ref 39.0–52.0)
Hemoglobin: 10.2 g/dL — ABNORMAL LOW (ref 13.0–17.0)
Immature Granulocytes: 1 %
Lymphocytes Relative: 13 %
Lymphs Abs: 2.3 10*3/uL (ref 0.7–4.0)
MCH: 28.7 pg (ref 26.0–34.0)
MCHC: 32.5 g/dL (ref 30.0–36.0)
MCV: 88.2 fL (ref 80.0–100.0)
Monocytes Absolute: 1.1 10*3/uL — ABNORMAL HIGH (ref 0.1–1.0)
Monocytes Relative: 6 %
Neutro Abs: 13.9 10*3/uL — ABNORMAL HIGH (ref 1.7–7.7)
Neutrophils Relative %: 77 %
Platelets: 197 10*3/uL (ref 150–400)
RBC: 3.56 MIL/uL — ABNORMAL LOW (ref 4.22–5.81)
RDW: 14.2 % (ref 11.5–15.5)
WBC: 17.8 10*3/uL — ABNORMAL HIGH (ref 4.0–10.5)
nRBC: 0 % (ref 0.0–0.2)

## 2021-05-17 LAB — HEPATITIS B SURFACE ANTIBODY, QUANTITATIVE: Hep B S AB Quant (Post): <3.1 m[IU]/mL — ABNORMAL LOW (ref >9.9)

## 2021-05-17 LAB — MAGNESIUM: Magnesium: 2.1 mg/dL (ref 1.7–2.4)

## 2021-05-17 LAB — BASIC METABOLIC PANEL
Anion gap: 14 (ref 5–15)
BUN: 107 mg/dL — ABNORMAL HIGH (ref 6–20)
CO2: 23 mmol/L (ref 22–32)
Calcium: 8.9 mg/dL (ref 8.9–10.3)
Chloride: 99 mmol/L (ref 98–111)
Creatinine, Ser: 8.12 mg/dL — ABNORMAL HIGH (ref 0.61–1.24)
GFR, Estimated: 7 mL/min — ABNORMAL LOW (ref >60)
Glucose, Bld: 146 mg/dL — ABNORMAL HIGH (ref 70–99)
Potassium: 4.3 mmol/L (ref 3.5–5.1)
Sodium: 136 mmol/L (ref 135–145)

## 2021-05-17 NOTE — Progress Notes (Signed)
OT Cancellation Note  Patient Details Name: [name] MRN: [id number] DOB: 1966/11/19   Cancelled Treatment:    Reason Eval/Treat Not Completed: Other (comment). Pt with HD at bedside, bipap in place. Placed on bed pan and attempted to assist with cleaning however pt continues to have loose BM and left with 2 NT to complete. Will re-attempt evaluation when pt able to participate in conversation and mobility.   Dessie Coma, M.S. OTR/L  05/17/21, 1:41 PM  ascom [phone]

## 2021-05-17 NOTE — Progress Notes (Signed)
[facility]  ROUNDING NOTE   Subjective:   Seen and examined on hemodialysis.     HEMODIALYSIS FLOWSHEET:  Blood Flow Rate (mL/min): 400 mL/min Arterial Pressure (mmHg): -230 mmHg Venous Pressure (mmHg): 180 mmHg Transmembrane Pressure (mmHg): 50 mmHg Ultrafiltration Rate (mL/min): 1170 mL/min Dialysate Flow Rate (mL/min): 500 ml/min Conductivity: Machine : 14 Conductivity: Machine : 14 Dialysis Fluid Bolus: Normal Saline Bolus Amount (mL): 0 mL Dialysate Change: 2K (pt is bak on 2k bath as per Dr. Juleen China)    Objective:  Vital signs in last 24 hours:  Temp:  [97.8 F (36.6 C)-99 F (37.2 C)] 99 F (37.2 C) (06/01 0930) Pulse Rate:  [62-84] 77 (06/01 1200) Resp:  [14-22] 17 (06/01 1200) BP: (111-150)/(75-92) 122/76 (06/01 1200) SpO2:  [80 %-100 %] 100 % (06/01 1200) FiO2 (%):  [50 %] 50 % (06/01 0000) Weight:  [505 kg] 116 kg (06/01 0353)  Weight change: 3.6 kg Filed Weights   05/11/21 0448 05/16/21 0402 05/17/21 0353  Weight: 122 kg 112.4 kg 116 kg    Intake/Output: I/O last 3 completed shifts: In: 600 [P.O.:600] Out: 725 [Urine:725]   Intake/Output this shift:  Total I/O In: 60 [P.O.:60] Out: 125 [Urine:125]  Physical Exam: General: In bed  Head: Normocephalic, atraumatic. Moist oral mucosal membranes  Eyes: Anicteric, PERRL  Neck: Supple, trachea midline  Lungs:  Diminished bilaterally, HFNC O2 5L  Heart: Regular rate and rhythm  Abdomen:  Soft, nontender, obese  Extremities: no peripheral edema.  Neurologic: Nonfocal, moving all four extremities  Skin: No lesions  Access: Right AVF    Basic Metabolic Panel: Recent Labs  Lab 05/12/21 0615 05/13/21 0418 05/14/21 0420 05/14/21 2233 05/15/21 0544 05/16/21 0432 05/17/21 0429  NA 136 138 137  --  137 136 136  K 5.1 5.5* 5.4* 5.0 4.9 5.6* 4.3  CL 97* 99 97*  --  97* 98 99  CO2 _0 --  _1 GLUCOSE 247* 205* 191*  --  142* 225* 146*  BUN 79* 79* 75*  --  108* 86*  107*  CREATININE 8.98* 8.38* 7.39*  --  8.99* 7.04* 8.12*  CALCIUM 8.9 9.0 9.1  --  8.9 8.9 8.9  MG 2.1 2.0 2.0  --  2.2 2.0 2.1  PHOS 7.6* 9.3* 9.0*  --  8.1* 7.9*  --     Liver Function Tests: No results for input(s): AST, ALT, ALKPHOS, BILITOT, PROT, ALBUMIN in the last 168 hours. No results for input(s): LIPASE, AMYLASE in the last 168 hours. No results for input(s): AMMONIA in the last 168 hours.  CBC: Recent Labs  Lab 05/13/21 0418 05/14/21 0420 05/15/21 0544 05/16/21 0432 05/17/21 0429  WBC 12.9* 12.8* 15.7* 16.8* 17.8*  NEUTROABS 11.6* 11.5* 13.7* 15.3* 13.9*  HGB 9.9* 10.0* 9.5* 10.7* 10.2*  HCT 31.6* 32.3* 29.4* 33.1* 31.4*  MCV 91.1 91.2 88.6 89.5 88.2  PLT 208 197 179 193 197    Cardiac Enzymes: No results for input(s): CKTOTAL, CKMB, CKMBINDEX, TROPONINI in the last 168 hours.  BNP: Invalid input(s): POCBNP  CBG: Recent Labs  Lab 05/16/21 1936 05/16/21 2312 05/17/21 0333 05/17/21 0718 05/17/21 1146  GLUCAP 217* 167* 145* 114* 159*    Microbiology: Results for orders placed or performed during the hospital encounter of 05/08/21  Resp Panel by RT-PCR (Flu A&B, Covid) Nasopharyngeal Swab     Status: None   Collection Time: 05/08/21  8:04 AM   Specimen: Nasopharyngeal Swab; Nasopharyngeal(NP) swabs  in vial transport medium  Result Value Ref Range Status   SARS Coronavirus 2 by RT PCR NEGATIVE NEGATIVE Final    Comment: (NOTE) SARS-CoV-2 target nucleic acids are NOT DETECTED.  The SARS-CoV-2 RNA is generally detectable in upper respiratory specimens during the acute phase of infection. The lowest concentration of SARS-CoV-2 viral copies this assay can detect is 138 copies/mL. A negative result does not preclude SARS-Cov-2 infection and should not be used as the sole basis for treatment or other patient management decisions. A negative result may occur with  improper specimen collection/handling, submission of specimen other than nasopharyngeal  swab, presence of viral mutation(s) within the areas targeted by this assay, and inadequate number of viral copies(<138 copies/mL). A negative result must be combined with clinical observations, patient history, and epidemiological information. The expected result is Negative.  Fact Sheet for Patients:  EntrepreneurPulse.com.au  Fact Sheet for Healthcare Providers:  IncredibleEmployment.be  This test is no t yet approved or cleared by the [place] FDA and  has been authorized for detection and/or diagnosis of SARS-CoV-2 by FDA under an Emergency Use Authorization (EUA). This EUA will remain  in effect (meaning this test can be used) for the duration of the COVID-19 declaration under Section 564(b)(1) of the Act, 21 U.S.C.section 360bbb-3(b)(1), unless the authorization is terminated  or revoked sooner.       Influenza A by PCR NEGATIVE NEGATIVE Final   Influenza B by PCR NEGATIVE NEGATIVE Final    Comment: (NOTE) The Xpert Xpress SARS-CoV-2/FLU/RSV plus assay is intended as an aid in the diagnosis of influenza from Nasopharyngeal swab specimens and should not be used as a sole basis for treatment. Nasal washings and aspirates are unacceptable for Xpert Xpress SARS-CoV-2/FLU/RSV testing.  Fact Sheet for Patients: EntrepreneurPulse.com.au  Fact Sheet for Healthcare Providers: IncredibleEmployment.be  This test is not yet approved or cleared by the [place] FDA and has been authorized for detection and/or diagnosis of SARS-CoV-2 by FDA under an Emergency Use Authorization (EUA). This EUA will remain in effect (meaning this test can be used) for the duration of the COVID-19 declaration under Section 564(b)(1) of the Act, 21 U.S.C. section 360bbb-3(b)(1), unless the authorization is terminated or revoked.  Performed at [facility], [address] [place], [place] [address]   Blood  culture (single)     Status: None   Collection Time: 05/08/21  8:59 AM   Specimen: BLOOD  Result Value Ref Range Status   Specimen Description BLOOD RIGHT [facility]  Final   Special Requests   Final    BOTTLES DRAWN AEROBIC AND ANAEROBIC Blood Culture results may not be optimal due to an excessive volume of blood received in culture bottles   Culture   Final    NO GROWTH 5 DAYS Performed at [facility], [address] [place], [place] [address]    Report Status 05/13/2021 FINAL  Final  MRSA PCR Screening     Status: None   Collection Time: 05/08/21  6:50 PM   Specimen: Nasopharyngeal  Result Value Ref Range Status   MRSA by PCR NEGATIVE NEGATIVE Final    Comment:        The GeneXpert MRSA Assay (FDA approved for NASAL specimens only), is one component of a comprehensive MRSA colonization surveillance program. It is not intended to diagnose MRSA infection nor to guide or monitor treatment for MRSA infections. Performed at [facility], [address] [place], [place] [address]   Culture, Respiratory w  Gram Stain     Status: None   Collection Time: 05/09/21 12:16 AM   Specimen: Tracheal Aspirate; Respiratory  Result Value Ref Range Status   Specimen Description   Final    TRACHEAL ASPIRATE Performed at [facility], [address] [place], [place] [address]    Special Requests   Final    NONE Performed at [facility], [address] [place], [place] [address]    Gram Stain   Final    MODERATE WBC PRESENT,BOTH PMN AND MONONUCLEAR RARE GRAM POSITIVE COCCI IN PAIRS RARE GRAM VARIABLE ROD    Culture   Final    FEW Normal respiratory flora-no Staph aureus or Pseudomonas seen Performed at [facility], [address] [place], [place] [address]    Report Status 05/11/2021 FINAL  Final  Culture, Respiratory w Gram Stain     Status: None   Collection Time: 05/13/21  9:10 PM   Specimen: Tracheal Aspirate; Respiratory  Result  Value Ref Range Status   Specimen Description   Final    TRACHEAL ASPIRATE Performed at [facility], [address] [place], [place] [address]    Special Requests   Final    NONE Performed at  Hospital Lab, [address] [place], [place] [address]    Gram Stain   Final    FEW WBC PRESENT, PREDOMINANTLY PMN MODERATE GRAM NEGATIVE RODS Performed at [facility], [address] [place], [place] [address]    Culture   Final    ABUNDANT ENTEROBACTER CLOACAE ABUNDANT STENOTROPHOMONAS MALTOPHILIA    Report Status 05/16/2021 FINAL  Final   Organism ID, Bacteria ENTEROBACTER CLOACAE  Final   Organism ID, Bacteria STENOTROPHOMONAS MALTOPHILIA  Final      Susceptibility   Enterobacter cloacae - MIC*    CEFAZOLIN >=64 RESISTANT Resistant     CEFEPIME <=0.12 SENSITIVE Sensitive     CEFTAZIDIME <=1 SENSITIVE Sensitive     CIPROFLOXACIN <=0.25 SENSITIVE Sensitive     GENTAMICIN <=1 SENSITIVE Sensitive     IMIPENEM <=0.25 SENSITIVE Sensitive     TRIMETH/SULFA <=20 SENSITIVE Sensitive     PIP/TAZO <=4 SENSITIVE Sensitive     * ABUNDANT ENTEROBACTER CLOACAE   Stenotrophomonas maltophilia - MIC*    LEVOFLOXACIN 0.25 SENSITIVE Sensitive     TRIMETH/SULFA <=20 SENSITIVE Sensitive     * ABUNDANT STENOTROPHOMONAS MALTOPHILIA    Coagulation Studies: No results for input(s): LABPROT, INR in the last 72 hours.  Urinalysis: No results for input(s): COLORURINE, LABSPEC, PHURINE, GLUCOSEU, HGBUR, BILIRUBINUR, KETONESUR, PROTEINUR, UROBILINOGEN, NITRITE, LEUKOCYTESUR in the last 72 hours.  Invalid input(s): APPERANCEUR    Imaging: No results found.   Medications:   . sodium chloride    . sodium chloride    . sodium chloride    . sodium chloride    . sodium chloride     . allopurinol  200 mg Oral q AM  . aspirin  81 mg Oral Daily  . atorvastatin  40 mg Oral q AM  . budesonide (PULMICORT) nebulizer solution  0.5 mg Nebulization BID  . calcium acetate   2,001 mg Oral TID WC  . chlorhexidine gluconate (MEDLINE KIT)  15 mL Mouth Rinse BID  . Chlorhexidine Gluconate Cloth  6 each Topical Q0600  . cholecalciferol  1,000 Units Oral Daily  . epoetin (EPOGEN/PROCRIT) injection  4,000 Units Intravenous Q M,W,F-HD  . furosemide  80 mg Oral Daily  . heparin  5,000 Units Subcutaneous   Q8H  . insulin aspart  0-15 Units Subcutaneous Q4H  . insulin glargine  15 Units Subcutaneous QHS  . ipratropium-albuterol  3 mL Nebulization BID  . metoprolol succinate  25 mg Oral Daily  . multivitamin  1 tablet Oral QHS  . pantoprazole (PROTONIX) IV  40 mg Intravenous Q24H  . [START ON 05/18/2021] predniSONE  40 mg Oral Q breakfast   Followed by  . [START ON 05/19/2021] predniSONE  30 mg Oral Q breakfast   Followed by  . [START ON 05/20/2021] predniSONE  20 mg Oral Q breakfast   Followed by  . [START ON 05/21/2021] predniSONE  10 mg Oral Q breakfast  . Ensure Max Protein  11 oz Oral Daily  . sodium chloride flush  3 mL Intravenous Q12H  . triamcinolone cream   Topical BID   sodium chloride, sodium chloride, sodium chloride, sodium chloride, acetaminophen, albuterol, alteplase, cyclobenzaprine, docusate, heparin, heparin, hydrALAZINE, lidocaine (PF), lidocaine-prilocaine, ondansetron (ZOFRAN) IV, pentafluoroprop-tetrafluoroeth, polyethylene glycol, sodium chloride flush  Assessment/ Plan:  [name] is a 54 y.o. black male with end stage renal disease on hemodialysis, hypertension, diabetes mellitus type II insulin dependent, pulmonary hypertension, sleep apnea, hyperlipidemia, GERD, gout, coronary artery disease, congestive heart failure, psoariatic arthritis who is admitted to [facility] on 05/08/2021 for Hyperkalemia [E87.5] Stupor [R40.1] Acute respiratory failure with hypoxia (HCC) [J96.01] Chronic respiratory failure with hypoxia (HCC) [J96.11] ESRD on hemodialysis (HCC) [N18.6, Z99.2] Type 2 diabetes mellitus without complication, with long-term current use  of insulin (HCC) [E11.9, Z79.4] Chronic congestive heart failure, unspecified heart failure type (HCC) [I50.9]  [facility] [address] Right AVF 120kg  1. End Stage Renal Disease: seen and examined on hemodialysis treatment.  Continue 2K bath.   2. Hypertension: blood pressure elevated, 122/76 - steroid driven - furosemide.   3. Anemia of chronic kidney disease: hemoglobin 10.2. EPO with HD treatments.   4. Secondary Hyperparathyroidism: with hyperphosphatemia.  - Continue calcium acetate.    LOS: 9   6/1/202212:08 PM  

## 2021-05-17 NOTE — Progress Notes (Signed)
Pre HD, the patient stated " I want you to use the catheter because I do not want any pain with the cannulation of my AV access; I usually use Emla cream and I do not think the lidocaine spray will be as effective" Dr. Abigail Butts was called and made aware of the patient's request and verbal was ok was obtained to use the [place] cath.

## 2021-05-17 NOTE — Evaluation (Signed)
Physical Therapy Evaluation Patient Details Name: [name] MRN: [id number] DOB: [birth year] Today's Date: 05/17/2021   History of Present Illness  Pt is a 55 y.o. black male with end stage renal disease on hemodialysis, hypertension, diabetes mellitus type II insulin dependent, pulmonary hypertension, sleep apnea, hyperlipidemia, GERD, gout, coronary artery disease, congestive heart failure, psoariatic arthritis who is admitted to [facility] on 05/08/2021 for cardiac arrest; 12 minutes of CPR and intubated. Pt also with elevated K+ noted during admission.    Clinical Impression  Pt alert, oriented x4, RN at  Bedside as well. Pt agreeable to PT with motivation from PT and RN. Reported at baseline he is modI in his apartment, a friend helps with groceries.  The patient was able to perform supine to sit with minA and use of bed rails. Fair sitting balance noted. Sit <> stand with 2 person handheld assist minA, intermittent posterior lean noted. Pt maxA for pericare due to small BM noted. He was able to take 3-5 steps at EOB towards [facility] with two person handheld assist, some unsteadiness noted. Returned to supine with all needs in reach.  Overall the patient demonstrated deficits (see "PT Problem List") that impede the patient's functional abilities, safety, and mobility and would benefit from skilled PT intervention. Recommendation is SNF due to decline from PLOF and current level of assistance needed.     Follow Up Recommendations SNF    Equipment Recommendations  None recommended by PT    Recommendations for Other Services       Precautions / Restrictions Precautions Precautions: Fall Restrictions Weight Bearing Restrictions: No      Mobility  Bed Mobility Overal bed mobility: Needs Assistance Bed Mobility: Supine to Sit;Sit to Supine     Supine to sit: Min assist;HOB elevated Sit to supine: Min assist        Transfers Overall transfer level: Needs assistance Equipment  used: 2 person hand held assist Transfers: Sit to/from Stand Sit to Stand: Min assist;From elevated surface;+2 safety/equipment         General transfer comment: pt with intermittent posterior lean noted  Ambulation/Gait Ambulation/Gait assistance: Min assist;+2 physical assistance Gait Distance (Feet): 3 Feet Assistive device: 2 person hand held assist       General Gait Details: limited by pt fatigue  Stairs            Wheelchair Mobility    Modified Rankin (Stroke Patients Only)       Balance Overall balance assessment: Needs assistance Sitting-balance support: Feet supported Sitting balance-Leahy Scale: Fair       Standing balance-Leahy Scale: Poor Standing balance comment: reliant on UE support                             Pertinent Vitals/Pain Pain Assessment: Faces Faces Pain Scale: Hurts a little bit Pain Location: sitting EOB Pain Descriptors / Indicators: Aching;Grimacing Pain Intervention(s): Limited activity within patient's tolerance;Repositioned;Monitored during session    Home Living Family/patient expects to be discharged to:: Private residence Living Arrangements: Alone Available Help at Discharge: Friend(s);Available PRN/intermittently Type of Home: Apartment Home Access: Level entry     Home Layout: One level Home Equipment: Walker - 4 wheels (3L of oxygen at baseline)      Prior Function Level of Independence: Independent with assistive device(s)         Comments: Mod I with rollator walker for ambulation. Patient reports independent with ADLs. Patient does not drive  and has transportation set-up for dialysis. A friend assist with groceries and other things as needed     Hand Dominance        Extremity/Trunk Assessment   Upper Extremity Assessment Upper Extremity Assessment: Overall WFL for tasks assessed    Lower Extremity Assessment Lower Extremity Assessment: Generalized weakness    Cervical / Trunk  Assessment Cervical / Trunk Assessment: Kyphotic  Communication   Communication: No difficulties  Cognition Arousal/Alertness: Awake/alert Behavior During Therapy: WFL for tasks assessed/performed Overall Cognitive Status: Within Functional Limits for tasks assessed                                        General Comments      Exercises     Assessment/Plan    PT Assessment Patient needs continued PT services  PT Problem List Decreased strength;Decreased mobility;Decreased activity tolerance;Decreased balance;Decreased knowledge of use of DME       PT Treatment Interventions DME instruction;Therapeutic exercise;Gait training;Balance training;Stair training;Therapeutic activities;Patient/family education    PT Goals (Current goals can be found in the Care Plan section)  Acute Rehab PT Goals Patient Stated Goal: "to get back on my bowflex" PT Goal Formulation: With patient Time For Goal Achievement: 05/31/21 Potential to Achieve Goals: Good    Frequency Min 2X/week   Barriers to discharge        Co-evaluation               AM-PAC PT "6 Clicks" Mobility  Outcome Measure Help needed turning from your back to your side while in a flat bed without using bedrails?: A Little Help needed moving from lying on your back to sitting on the side of a flat bed without using bedrails?: A Little Help needed moving to and from a bed to a chair (including a wheelchair)?: A Lot Help needed standing up from a chair using your arms (e.g., wheelchair or bedside chair)?: A Lot Help needed to walk in hospital room?: A Lot Help needed climbing 3-5 steps with a railing? : Total 6 Click Score: 13    End of Session Equipment Utilized During Treatment: Oxygen (5-6L) Activity Tolerance: Patient limited by fatigue Patient left: in bed;with call bell/phone within reach;with bed alarm set Nurse Communication: Mobility status PT Visit Diagnosis: Other abnormalities of gait and  mobility (R26.89);Muscle weakness (generalized) (M62.81);Difficulty in walking, not elsewhere classified (R26.2)    Time: 9833-8250 PT Time Calculation (min) (ACUTE ONLY): 27 min   Charges:   PT Evaluation $PT Eval Moderate Complexity: 1 Mod PT Treatments $Therapeutic Activity: 23-37 mins       Lieutenant Diego PT, DPT 4:43 PM,05/17/21

## 2021-05-17 NOTE — Progress Notes (Signed)
PT Cancellation Note  Patient Details Name: [name] MRN: [id number] DOB: September 26, 1966   Cancelled Treatment:    Reason Eval/Treat Not Completed: Other (comment). Pt with HD at bedside, bipap in place. Pt noted to request bed pan, assist with placement but BM noted. OT and two techs at bedside to assist, PT to re-attempt evaluation and exertional activity when pt is more appropriate.   Lieutenant Diego PT, DPT 10:13 AM,05/17/21

## 2021-05-17 NOTE — Progress Notes (Signed)
PROGRESS NOTE  ICU transfer.  [name]  XBD:[id number] DOB: 1966/04/08 DOA: 05/08/2021 PCP: [facility]   Brief Narrative: Taken from prior records. 55 y.o.malewith a history ofdCHF,ESRD on hemodialysis COPD,diabetes morbid obesity who presents to ER with severealtered mental status. Had a fall hitting the left side of his forehead yesterday. Went to dialysis on the day of admission where he was noted to be somnolent, poorly interactive, not able to follow commands. Patient later arrested, requiring CPR for 12 minutes, transferred to hospital and intubated.  Echocardiogram with EF of 50 to 55% and rest was within normal limit. Extubated on 05/14/2021.  Continue to improve and tolerating dialysis. There is some concern of tracheal colonization with stenotrophomonas Enterobacter, received empiric antibiotics during current hospitalization and there is no need for further treatment. PT is recommending SNF placement.  Significant events. 05/12/21- patient is weaned on ventilator to Fio2 45%.  Holding OGF for SBT today.  He is awake RASS-0 during my evaluation. Labs reviewed with mild trend up of wbc hb stable 9.3. POC glucose 216. On unasyn day 5, blood cultures negative.   05/13/21- patient received HD.  He was unable to be weaned today. Plan is for SBT as soon as possible for liberation from MV>   05/14/21- patient is extubated to BIPAP.  He is lucid and expressed hunger asking for [employer] meal.  Family came to bedside appreciative of care.    05/15/21-patient is post extubation. He passed his SLP today. He had HD today.   05/16/21- patient improved, will transfer to step down unti and involve TRH service starting from 6/1.  Subjective: Patient has seen during getting dialysis in ICU today.  He was on BiPAP, stating that he wants to go to sleep.  Alert and oriented.  Per nursing staff he was on 6 L of oxygen earlier in the day.  Denies any chest pain or shortness  of breath.  Assessment & Plan:   Active Problems:   Essential hypertension   CAD (coronary artery disease)   Chronic gout of multiple sites   ESRD (end stage renal disease) on dialysis (HCC)   Acute encephalopathy   Acute on chronic respiratory failure with hypoxia (HCC)   COPD (chronic obstructive pulmonary disease) ([place])   HLD (hyperlipidemia)   Fall at home, initial encounter   Acute metabolic encephalopathy   Uremia   Elevated troponin   Leukocytosis   Type II diabetes mellitus with renal manifestations (HCC)  Acute on chronic hypoxic respiratory failure.  Extubated on 05/14/2021.  Continue to require more oxygen than his baseline of 3 L. -Try weaning to baseline. -Continue using BiPAP while sleeping.  Encephalopathy.  Patient was initially found to be hyperkalemic with potassium of 7.2 and bradycardia.  On note mentioned about cardiac arrest requiring CPR and intubation. Now appears at baseline. -PT is recommending SNF placement  COPD.  No wheezing today. -Continue with steroid with a tapering dose. -Continue with bronchodilators  Post cardiac arrest.  With polymorphic VT, may be secondary to hyperkalemia with potassium of 7.20.  EF within lower normal limit.  Patient refused ICD and not interested in getting cardiac catheterization per cardiology note. Patient has an history of CAD s/p PCI and stent placement of RCA in remote past. Cardiology is following-appreciate their recommendations.  ESRD.  On hemodialysis, nephrology is following. -Continue with routine dialysis  Type 2 diabetes mellitus. -Continue with SSI  Concern of aspiration pneumonia. -Completed a course of antibiotics  Objective: Vitals:  05/17/21 1245 05/17/21 1300 05/17/21 1305 05/17/21 1315  BP: 107/64 (!) 88/61 115/73 114/76  Pulse: 81 72 74 80  Resp: 18 20 (!) 21 18  Temp:      TempSrc:      SpO2: 100% 100% 98% 100%  Weight:      Height:        Intake/Output Summary (Last 24 hours) at  05/17/2021 1730 Last data filed at 05/17/2021 1305 Gross per 24 hour  Intake 420 ml  Output 3099 ml  Net -2679 ml   Filed Weights   05/11/21 0448 05/16/21 0402 05/17/21 0353  Weight: 122 kg 112.4 kg 116 kg    Examination:  General exam: Appears calm and comfortable  Respiratory system: Clear to auscultation. Respiratory effort normal. Cardiovascular system: S1 & S2 heard, RRR. No JVD, murmurs, rubs, gallops or clicks. Gastrointestinal system: Soft, nontender, nondistended, bowel sounds positive. Central nervous system: Alert and oriented. No focal neurological deficits. Extremities: Trace LE edema, no cyanosis, pulses intact and symmetrical. Psychiatry: Judgement and insight appear normal.    DVT prophylaxis: Heparin Code Status: Full  family Communication: Discussed with patient Disposition Plan:  Status is: Inpatient  Remains inpatient appropriate because:Inpatient level of care appropriate due to severity of illness   Dispo: The patient is from: Home              Anticipated d/c is to: SNF              Patient currently is not medically stable to d/c.   Difficult to place patient No              Level of care: Progressive Cardiac  All the records are reviewed and case discussed with Care Management/Social Worker. Management plans discussed with the patient, nursing and they are in agreement.  Consultants:   PCCM  Nephrology  Cardiology  Procedures:  Antimicrobials:   Data Reviewed: I have personally reviewed following labs and imaging studies  CBC: Recent Labs  Lab 05/13/21 0418 05/14/21 0420 05/15/21 0544 05/16/21 0432 05/17/21 0429  WBC 12.9* 12.8* 15.7* 16.8* 17.8*  NEUTROABS 11.6* 11.5* 13.7* 15.3* 13.9*  HGB 9.9* 10.0* 9.5* 10.7* 10.2*  HCT 31.6* 32.3* 29.4* 33.1* 31.4*  MCV 91.1 91.2 88.6 89.5 88.2  PLT 208 197 179 193 703   Basic Metabolic Panel: Recent Labs  Lab 05/12/21 0615 05/13/21 0418 05/14/21 0420 05/14/21 2233 05/15/21 0544  05/16/21 0432 05/17/21 0429  NA 136 138 137  --  137 136 136  K 5.1 5.5* 5.4* 5.0 4.9 5.6* 4.3  CL 97* 99 97*  --  97* 98 99  CO2 _0 --  _1 GLUCOSE 247* 205* 191*  --  142* 225* 146*  BUN 79* 79* 75*  --  108* 86* 107*  CREATININE 8.98* 8.38* 7.39*  --  8.99* 7.04* 8.12*  CALCIUM 8.9 9.0 9.1  --  8.9 8.9 8.9  MG 2.1 2.0 2.0  --  2.2 2.0 2.1  PHOS 7.6* 9.3* 9.0*  --  8.1* 7.9*  --    GFR: Estimated Creatinine Clearance: 12.1 mL/min (A) (by C-G formula based on SCr of 8.12 mg/dL (H)). Liver Function Tests: No results for input(s): AST, ALT, ALKPHOS, BILITOT, PROT, ALBUMIN in the last 168 hours. No results for input(s): LIPASE, AMYLASE in the last 168 hours. No results for input(s): AMMONIA in the last 168 hours. Coagulation Profile: No results for input(s): INR, PROTIME in the last 168  hours. Cardiac Enzymes: No results for input(s): CKTOTAL, CKMB, CKMBINDEX, TROPONINI in the last 168 hours. BNP (last 3 results) No results for input(s): PROBNP in the last 8760 hours. HbA1C: No results for input(s): HGBA1C in the last 72 hours. CBG: Recent Labs  Lab 05/16/21 2312 05/17/21 0333 05/17/21 0718 05/17/21 1146 05/17/21 1518  GLUCAP 167* 145* 114* 159* 330*   Lipid Profile: No results for input(s): CHOL, HDL, LDLCALC, TRIG, CHOLHDL, LDLDIRECT in the last 72 hours. Thyroid Function Tests: No results for input(s): TSH, T4TOTAL, FREET4, T3FREE, THYROIDAB in the last 72 hours. Anemia Panel: No results for input(s): VITAMINB12, FOLATE, FERRITIN, TIBC, IRON, RETICCTPCT in the last 72 hours. Sepsis Labs: No results for input(s): PROCALCITON, LATICACIDVEN in the last 168 hours.  Recent Results (from the past 240 hour(s))  Resp Panel by RT-PCR (Flu A&B, Covid) Nasopharyngeal Swab     Status: None   Collection Time: 05/08/21  8:04 AM   Specimen: Nasopharyngeal Swab; Nasopharyngeal(NP) swabs in vial transport medium  Result Value Ref Range Status   SARS Coronavirus 2 by  RT PCR NEGATIVE NEGATIVE Final    Comment: (NOTE) SARS-CoV-2 target nucleic acids are NOT DETECTED.  The SARS-CoV-2 RNA is generally detectable in upper respiratory specimens during the acute phase of infection. The lowest concentration of SARS-CoV-2 viral copies this assay can detect is 138 copies/mL. A negative result does not preclude SARS-Cov-2 infection and should not be used as the sole basis for treatment or other patient management decisions. A negative result may occur with  improper specimen collection/handling, submission of specimen other than nasopharyngeal swab, presence of viral mutation(s) within the areas targeted by this assay, and inadequate number of viral copies(<138 copies/mL). A negative result must be combined with clinical observations, patient history, and epidemiological information. The expected result is Negative.  Fact Sheet for Patients:  EntrepreneurPulse.com.au  Fact Sheet for Healthcare Providers:  IncredibleEmployment.be  This test is no t yet approved or cleared by the [place] FDA and  has been authorized for detection and/or diagnosis of SARS-CoV-2 by FDA under an Emergency Use Authorization (EUA). This EUA will remain  in effect (meaning this test can be used) for the duration of the COVID-19 declaration under Section 564(b)(1) of the Act, 21 U.S.C.section 360bbb-3(b)(1), unless the authorization is terminated  or revoked sooner.       Influenza A by PCR NEGATIVE NEGATIVE Final   Influenza B by PCR NEGATIVE NEGATIVE Final    Comment: (NOTE) The Xpert Xpress SARS-CoV-2/FLU/RSV plus assay is intended as an aid in the diagnosis of influenza from Nasopharyngeal swab specimens and should not be used as a sole basis for treatment. Nasal washings and aspirates are unacceptable for Xpert Xpress SARS-CoV-2/FLU/RSV testing.  Fact Sheet for Patients: EntrepreneurPulse.com.au  Fact Sheet  for Healthcare Providers: IncredibleEmployment.be  This test is not yet approved or cleared by the [place] FDA and has been authorized for detection and/or diagnosis of SARS-CoV-2 by FDA under an Emergency Use Authorization (EUA). This EUA will remain in effect (meaning this test can be used) for the duration of the COVID-19 declaration under Section 564(b)(1) of the Act, 21 U.S.C. section 360bbb-3(b)(1), unless the authorization is terminated or revoked.  Performed at [facility], [address] [place], [place] [address]   Blood culture (single)     Status: None   Collection Time: 05/08/21  8:59 AM   Specimen: BLOOD  Result Value Ref Range Status   Specimen Description BLOOD RIGHT [facility]  Final  Special Requests   Final    BOTTLES DRAWN AEROBIC AND ANAEROBIC Blood Culture results may not be optimal due to an excessive volume of blood received in culture bottles   Culture   Final    NO GROWTH 5 DAYS Performed at [facility], [address] [place], [place] [address]    Report Status 05/13/2021 FINAL  Final  MRSA PCR Screening     Status: None   Collection Time: 05/08/21  6:50 PM   Specimen: Nasopharyngeal  Result Value Ref Range Status   MRSA by PCR NEGATIVE NEGATIVE Final    Comment:        The GeneXpert MRSA Assay (FDA approved for NASAL specimens only), is one component of a comprehensive MRSA colonization surveillance program. It is not intended to diagnose MRSA infection nor to guide or monitor treatment for MRSA infections. Performed at [facility], [address] [place], [place] [address]   Culture, Respiratory w Gram Stain     Status: None   Collection Time: 05/09/21 12:16 AM   Specimen: Tracheal Aspirate; Respiratory  Result Value Ref Range Status   Specimen Description   Final    TRACHEAL ASPIRATE Performed at [facility], [address] [place], [place] [address]    Special Requests    Final    NONE Performed at [facility], [place], [place] [address]    Gram Stain   Final    MODERATE WBC PRESENT,BOTH PMN AND MONONUCLEAR RARE GRAM POSITIVE COCCI IN PAIRS RARE GRAM VARIABLE ROD    Culture   Final    FEW Normal respiratory flora-no Staph aureus or Pseudomonas seen Performed at [facility], [place] [address] [place], [place] [address]    Report Status 05/11/2021 FINAL  Final  Culture, Respiratory w Gram Stain     Status: None   Collection Time: 05/13/21  9:10 PM   Specimen: Tracheal Aspirate; Respiratory  Result Value Ref Range Status   Specimen Description   Final    TRACHEAL ASPIRATE Performed at [facility], [address], [place], [place] [address]    Special Requests   Final    NONE Performed at [facility], [address] [place], [place] [address]    Gram Stain   Final    FEW WBC PRESENT, PREDOMINANTLY PMN MODERATE GRAM NEGATIVE RODS Performed at [facility], [place] [address] [place], [place] [address]    Culture   Final    ABUNDANT ENTEROBACTER CLOACAE ABUNDANT STENOTROPHOMONAS MALTOPHILIA    Report Status 05/16/2021 FINAL  Final   Organism ID, Bacteria ENTEROBACTER CLOACAE  Final   Organism ID, Bacteria STENOTROPHOMONAS MALTOPHILIA  Final      Susceptibility   Enterobacter cloacae - MIC*    CEFAZOLIN >=64 RESISTANT Resistant     CEFEPIME <=0.12 SENSITIVE Sensitive     CEFTAZIDIME <=1 SENSITIVE Sensitive     CIPROFLOXACIN <=0.25 SENSITIVE Sensitive     GENTAMICIN <=1 SENSITIVE Sensitive     IMIPENEM <=0.25 SENSITIVE Sensitive     TRIMETH/SULFA <=20 SENSITIVE Sensitive     PIP/TAZO <=4 SENSITIVE Sensitive     * ABUNDANT ENTEROBACTER CLOACAE   Stenotrophomonas maltophilia - MIC*    LEVOFLOXACIN 0.25 SENSITIVE Sensitive     TRIMETH/SULFA <=20 SENSITIVE Sensitive     * ABUNDANT STENOTROPHOMONAS MALTOPHILIA     Radiology Studies: No results found.  Scheduled Meds: . allopurinol   200 mg Oral q AM  .  aspirin  81 mg Oral Daily  . atorvastatin  40 mg Oral q AM  . budesonide (PULMICORT) nebulizer solution  0.5 mg Nebulization BID  . calcium acetate  2,001 mg Oral TID WC  . chlorhexidine gluconate (MEDLINE KIT)  15 mL Mouth Rinse BID  . Chlorhexidine Gluconate Cloth  6 each Topical Q0600  . cholecalciferol  1,000 Units Oral Daily  . epoetin (EPOGEN/PROCRIT) injection  4,000 Units Intravenous Q M,W,F-HD  . furosemide  80 mg Oral Daily  . heparin  5,000 Units Subcutaneous Q8H  . insulin aspart  0-15 Units Subcutaneous Q4H  . insulin glargine  15 Units Subcutaneous QHS  . ipratropium-albuterol  3 mL Nebulization BID  . metoprolol succinate  25 mg Oral Daily  . multivitamin  1 tablet Oral QHS  . pantoprazole (PROTONIX) IV  40 mg Intravenous Q24H  . [START ON 05/18/2021] predniSONE  40 mg Oral Q breakfast   Followed by  . [START ON 05/19/2021] predniSONE  30 mg Oral Q breakfast   Followed by  . [START ON 05/20/2021] predniSONE  20 mg Oral Q breakfast   Followed by  . [START ON 05/21/2021] predniSONE  10 mg Oral Q breakfast  . Ensure Max Protein  11 oz Oral Daily  . sodium chloride flush  3 mL Intravenous Q12H  . triamcinolone cream   Topical BID   Continuous Infusions: . sodium chloride    . sodium chloride    . sodium chloride    . sodium chloride    . sodium chloride       LOS: 9 days   Time spent: 40 minutes. More than 50% of the time was spent in counseling/coordination of care  Lorella Nimrod, MD [facility]  If 7PM-7AM, please contact night-coverage [email or web address]  05/17/2021, 5:30 PM   This record has been created using Systems analyst. Errors have been sought and corrected,but may not always be located. Such creation errors do not reflect on the standard of care.

## 2021-05-18 DIAGNOSIS — E875 Hyperkalemia: Secondary | ICD-10-CM | POA: Diagnosis not present

## 2021-05-18 DIAGNOSIS — Z7189 Other specified counseling: Secondary | ICD-10-CM

## 2021-05-18 DIAGNOSIS — Z515 Encounter for palliative care: Secondary | ICD-10-CM

## 2021-05-18 DIAGNOSIS — I509 Heart failure, unspecified: Secondary | ICD-10-CM

## 2021-05-18 DIAGNOSIS — J9621 Acute and chronic respiratory failure with hypoxia: Secondary | ICD-10-CM | POA: Diagnosis not present

## 2021-05-18 DIAGNOSIS — W19XXXA Unspecified fall, initial encounter: Secondary | ICD-10-CM | POA: Diagnosis not present

## 2021-05-18 DIAGNOSIS — I2510 Atherosclerotic heart disease of native coronary artery without angina pectoris: Secondary | ICD-10-CM | POA: Diagnosis not present

## 2021-05-18 LAB — GLUCOSE, CAPILLARY
Glucose-Capillary: 176 mg/dL — ABNORMAL HIGH (ref 70–99)
Glucose-Capillary: 185 mg/dL — ABNORMAL HIGH (ref 70–99)
Glucose-Capillary: 200 mg/dL — ABNORMAL HIGH (ref 70–99)
Glucose-Capillary: 245 mg/dL — ABNORMAL HIGH (ref 70–99)
Glucose-Capillary: 255 mg/dL — ABNORMAL HIGH (ref 70–99)
Glucose-Capillary: 256 mg/dL — ABNORMAL HIGH (ref 70–99)
Glucose-Capillary: 342 mg/dL — ABNORMAL HIGH (ref 70–99)

## 2021-05-18 LAB — CBC WITH DIFFERENTIAL/PLATELET
Abs Immature Granulocytes: 0.19 10*3/uL — ABNORMAL HIGH (ref 0.00–0.07)
Basophils Absolute: 0 10*3/uL (ref 0.0–0.1)
Basophils Relative: 0 %
Eosinophils Absolute: 0 10*3/uL (ref 0.0–0.5)
Eosinophils Relative: 0 %
HCT: 35.2 % — ABNORMAL LOW (ref 39.0–52.0)
Hemoglobin: 11.5 g/dL — ABNORMAL LOW (ref 13.0–17.0)
Immature Granulocytes: 1 %
Lymphocytes Relative: 10 %
Lymphs Abs: 2.5 10*3/uL (ref 0.7–4.0)
MCH: 28.2 pg (ref 26.0–34.0)
MCHC: 32.7 g/dL (ref 30.0–36.0)
MCV: 86.3 fL (ref 80.0–100.0)
Monocytes Absolute: 1.4 10*3/uL — ABNORMAL HIGH (ref 0.1–1.0)
Monocytes Relative: 6 %
Neutro Abs: 19.7 10*3/uL — ABNORMAL HIGH (ref 1.7–7.7)
Neutrophils Relative %: 83 %
Platelets: 252 10*3/uL (ref 150–400)
RBC: 4.08 MIL/uL — ABNORMAL LOW (ref 4.22–5.81)
RDW: 14.1 % (ref 11.5–15.5)
WBC: 23.8 10*3/uL — ABNORMAL HIGH (ref 4.0–10.5)
nRBC: 0 % (ref 0.0–0.2)

## 2021-05-18 LAB — BASIC METABOLIC PANEL
Anion gap: 14 (ref 5–15)
BUN: 85 mg/dL — ABNORMAL HIGH (ref 6–20)
CO2: 26 mmol/L (ref 22–32)
Calcium: 9.1 mg/dL (ref 8.9–10.3)
Chloride: 97 mmol/L — ABNORMAL LOW (ref 98–111)
Creatinine, Ser: 6.6 mg/dL — ABNORMAL HIGH (ref 0.61–1.24)
GFR, Estimated: 9 mL/min — ABNORMAL LOW (ref >60)
Glucose, Bld: 149 mg/dL — ABNORMAL HIGH (ref 70–99)
Potassium: 4.2 mmol/L (ref 3.5–5.1)
Sodium: 137 mmol/L (ref 135–145)

## 2021-05-18 LAB — MAGNESIUM: Magnesium: 2 mg/dL (ref 1.7–2.4)

## 2021-05-18 MED ORDER — PANTOPRAZOLE SODIUM 40 MG PO TBEC
40.0000 mg | DELAYED_RELEASE_TABLET | Freq: Every day | ORAL | Status: DC
  Administered 2021-05-19: 40 mg via ORAL
  Filled 2021-05-18: qty 1

## 2021-05-18 NOTE — Progress Notes (Signed)
Hemodialysis patient known at [facility] ([place] MWF 8:00am. Patient recently switched clinics. Patient stated not dialysis concerns. Please contact me with dialysis placement concerns.  Elvera Bicker Dialysis Coordinator [phone]

## 2021-05-18 NOTE — Progress Notes (Signed)
PROGRESS NOTE  ICU transfer.  [name]  STM:[id number] DOB: [birth year] DOA: 05/08/2021 PCP: [facility]   Brief Narrative: Taken from prior records. 55 y.o.malewith a history ofdCHF,ESRD on hemodialysis COPD,diabetes morbid obesity who presents to ER with severealtered mental status. Had a fall hitting the left side of his forehead yesterday. Went to dialysis on the day of admission where he was noted to be somnolent, poorly interactive, not able to follow commands. Patient later arrested, requiring CPR for 12 minutes, transferred to hospital and intubated.  Echocardiogram with EF of 50 to 55% and rest was within normal limit. Extubated on 05/14/2021.  Continue to improve and tolerating dialysis. There is some concern of tracheal colonization with stenotrophomonas Enterobacter, received empiric antibiotics during current hospitalization and there is no need for further treatment. PT is recommending SNF placement, patient declined and wants to go home with home health services which were ordered. Nephrology is planning to do dialysis tomorrow using IV fistula, if successful they will remove HD catheter and he can be discharged after that.  Significant events. 05/12/21- patient is weaned on ventilator to Fio2 45%.  Holding OGF for SBT today.  He is awake RASS-0 during my evaluation. Labs reviewed with mild trend up of wbc hb stable 9.3. POC glucose 216. On unasyn day 5, blood cultures negative.   05/13/21- patient received HD.  He was unable to be weaned today. Plan is for SBT as soon as possible for liberation from MV>   05/14/21- patient is extubated to BIPAP.  He is lucid and expressed hunger asking for [employer] meal.  Family came to bedside appreciative of care.    05/15/21-patient is post extubation. He passed his SLP today. He had HD today.   05/16/21- patient improved, will transfer to step down unti and involve TRH service starting from  6/1.  Subjective: Patient was seen and examined with nephrology today.  No new complaint.  He wants to go home and declining to go to SNF as advised by PT.  Patient lives alone but stating that he can managed.  Okay for home health services.  Assessment & Plan:   Active Problems:   Essential hypertension   CAD (coronary artery disease)   Chronic gout of multiple sites   ESRD (end stage renal disease) on dialysis (HCC)   Acute encephalopathy   Acute on chronic respiratory failure with hypoxia (HCC)   COPD (chronic obstructive pulmonary disease) ([place])   HLD (hyperlipidemia)   Fall at home, initial encounter   Acute metabolic encephalopathy   Uremia   Elevated troponin   Leukocytosis   Type II diabetes mellitus with renal manifestations (HCC)  Acute on chronic hypoxic respiratory failure.  Resolved.  Now at baseline.  Extubated on 05/14/2021.   -Continue using BiPAP while sleeping.  Encephalopathy.  Resolved.  Patient was initially found to be hyperkalemic with potassium of 7.2 and bradycardia.  On note mentioned about cardiac arrest requiring CPR and intubation. Now appears at baseline. -PT is recommending SNF placement-patient wants to go home with home health services which were ordered.  COPD.  No wheezing today. -Continue with steroid with a tapering dose. -Continue with bronchodilators  Post cardiac arrest.  With polymorphic VT, may be secondary to hyperkalemia with potassium of 7.20.  EF within lower normal limit.  Patient refused ICD and not interested in getting cardiac catheterization per cardiology note. Patient has an history of CAD s/p PCI and stent placement of RCA in remote past. Cardiology is  following-appreciate their recommendations.  ESRD.  On hemodialysis, nephrology is following. Nephrology will try using fistula tomorrow for dialysis if successful then they will remove HD catheter and he can be discharged after that. -Continue with routine dialysis  Type  2 diabetes mellitus. -Continue with SSI  Concern of aspiration pneumonia. -Completed a course of antibiotics  Stage III obesity. Estimated body mass index is 45.31 kg/m as calculated from the following:   Height as of this encounter: 5' 2.99" (1.6 m).   Weight as of this encounter: 116 kg.  This will complicate overall prognosis.  Objective: Vitals:   05/18/21 0351 05/18/21 0923 05/18/21 1117 05/18/21 1511  BP: 122/81 131/80 120/78 (!) 145/83  Pulse: 69 75 66 62  Resp:  _0 Temp: 97.8 F (36.6 C)  97.9 F (36.6 C) 97.9 F (36.6 C)  TempSrc: Oral     SpO2: 100% 95% 95% 96%  Weight:      Height:        Intake/Output Summary (Last 24 hours) at 05/18/2021 1703 Last data filed at 05/18/2021 0900 Gross per 24 hour  Intake 240 ml  Output 150 ml  Net 90 ml   Filed Weights   05/11/21 0448 05/16/21 0402 05/17/21 0353  Weight: 122 kg 112.4 kg 116 kg    Examination:  General.  Morbidly obese gentleman, in no acute distress. Pulmonary.  Lungs clear bilaterally, normal respiratory effort. CV.  Regular rate and rhythm, no JVD, rub or murmur. Abdomen.  Soft, nontender, nondistended, BS positive. CNS.  Alert and oriented x3.  No focal neurologic deficit. Extremities.  No edema, no cyanosis, pulses intact and symmetrical. Psychiatry.  Judgment and insight appears normal.  DVT prophylaxis: Heparin Code Status: Full  family Communication: Discussed with patient Disposition Plan:  Status is: Inpatient  Remains inpatient appropriate because:Inpatient level of care appropriate due to severity of illness   Dispo: The patient is from: Home              Anticipated d/c is to: Home with home health              Patient currently is not medically stable to d/c.   Difficult to place patient No              Level of care: Progressive Cardiac  All the records are reviewed and case discussed with Care Management/Social Worker. Management plans discussed with the patient, nursing  and they are in agreement.  Consultants:   PCCM  Nephrology  Cardiology  Procedures:  Antimicrobials:   Data Reviewed: I have personally reviewed following labs and imaging studies  CBC: Recent Labs  Lab 05/14/21 0420 05/15/21 0544 05/16/21 0432 05/17/21 0429 05/18/21 0557  WBC 12.8* 15.7* 16.8* 17.8* 23.8*  NEUTROABS 11.5* 13.7* 15.3* 13.9* 19.7*  HGB 10.0* 9.5* 10.7* 10.2* 11.5*  HCT 32.3* 29.4* 33.1* 31.4* 35.2*  MCV 91.2 88.6 89.5 88.2 86.3  PLT 197 179 193 197 834   Basic Metabolic Panel: Recent Labs  Lab 05/12/21 0615 05/13/21 0418 05/14/21 0420 05/14/21 2233 05/15/21 0544 05/16/21 0432 05/17/21 0429 05/18/21 0557  NA 136 138 137  --  137 136 136 137  K 5.1 5.5* 5.4* 5.0 4.9 5.6* 4.3 4.2  CL 97* 99 97*  --  97* 98 99 97*  CO2 _1 --  _2 GLUCOSE 247* 205* 191*  --  142* 225* 146* 149*  BUN 79* 79* 75*  --  108* 86* 107* 85*  CREATININE 8.98* 8.38* 7.39*  --  8.99* 7.04* 8.12* 6.60*  CALCIUM 8.9 9.0 9.1  --  8.9 8.9 8.9 9.1  MG 2.1 2.0 2.0  --  2.2 2.0 2.1 2.0  PHOS 7.6* 9.3* 9.0*  --  8.1* 7.9*  --   --    GFR: Estimated Creatinine Clearance: 14.9 mL/min (A) (by C-G formula based on SCr of 6.6 mg/dL (H)). Liver Function Tests: No results for input(s): AST, ALT, ALKPHOS, BILITOT, PROT, ALBUMIN in the last 168 hours. No results for input(s): LIPASE, AMYLASE in the last 168 hours. No results for input(s): AMMONIA in the last 168 hours. Coagulation Profile: No results for input(s): INR, PROTIME in the last 168 hours. Cardiac Enzymes: No results for input(s): CKTOTAL, CKMB, CKMBINDEX, TROPONINI in the last 168 hours. BNP (last 3 results) No results for input(s): PROBNP in the last 8760 hours. HbA1C: No results for input(s): HGBA1C in the last 72 hours. CBG: Recent Labs  Lab 05/18/21 0042 05/18/21 0446 05/18/21 0846 05/18/21 1118 05/18/21 1558  GLUCAP 200* 176* 185* 245* 256*   Lipid Profile: No results for input(s): CHOL,  HDL, LDLCALC, TRIG, CHOLHDL, LDLDIRECT in the last 72 hours. Thyroid Function Tests: No results for input(s): TSH, T4TOTAL, FREET4, T3FREE, THYROIDAB in the last 72 hours. Anemia Panel: No results for input(s): VITAMINB12, FOLATE, FERRITIN, TIBC, IRON, RETICCTPCT in the last 72 hours. Sepsis Labs: No results for input(s): PROCALCITON, LATICACIDVEN in the last 168 hours.  Recent Results (from the past 240 hour(s))  MRSA PCR Screening     Status: None   Collection Time: 05/08/21  6:50 PM   Specimen: Nasopharyngeal  Result Value Ref Range Status   MRSA by PCR NEGATIVE NEGATIVE Final    Comment:        The GeneXpert MRSA Assay (FDA approved for NASAL specimens only), is one component of a comprehensive MRSA colonization surveillance program. It is not intended to diagnose MRSA infection nor to guide or monitor treatment for MRSA infections. Performed at [facility], [address] [place], [place] [address]   Culture, Respiratory w Gram Stain     Status: None   Collection Time: 05/09/21 12:16 AM   Specimen: Tracheal Aspirate; Respiratory  Result Value Ref Range Status   Specimen Description   Final    TRACHEAL ASPIRATE Performed at [facility], [address], [place], [place] [address]    Special Requests   Final    NONE Performed at [facility], [place], [place] [address]    Gram Stain   Final    MODERATE WBC PRESENT,BOTH PMN AND MONONUCLEAR RARE GRAM POSITIVE COCCI IN PAIRS RARE GRAM VARIABLE ROD    Culture   Final    FEW Normal respiratory flora-no Staph aureus or Pseudomonas seen Performed at [facility], [place] [address] [place], [place] [address]    Report Status 05/11/2021 FINAL  Final  Culture, Respiratory w Gram Stain     Status: None   Collection Time: 05/13/21  9:10 PM   Specimen: Tracheal Aspirate; Respiratory  Result Value Ref Range Status   Specimen Description   Final    TRACHEAL  ASPIRATE Performed at [facility], [address] [place], [place] [address]    Special Requests   Final    NONE Performed at [facility], [address] [place], [place] [address]    Gram Stain   Final  FEW WBC PRESENT, PREDOMINANTLY PMN MODERATE GRAM NEGATIVE RODS Performed at [facility], [place] [address], [place], [place] [address]    Culture   Final    ABUNDANT ENTEROBACTER CLOACAE ABUNDANT STENOTROPHOMONAS MALTOPHILIA    Report Status 05/16/2021 FINAL  Final   Organism ID, Bacteria ENTEROBACTER CLOACAE  Final   Organism ID, Bacteria STENOTROPHOMONAS MALTOPHILIA  Final      Susceptibility   Enterobacter cloacae - MIC*    CEFAZOLIN >=64 RESISTANT Resistant     CEFEPIME <=0.12 SENSITIVE Sensitive     CEFTAZIDIME <=1 SENSITIVE Sensitive     CIPROFLOXACIN <=0.25 SENSITIVE Sensitive     GENTAMICIN <=1 SENSITIVE Sensitive     IMIPENEM <=0.25 SENSITIVE Sensitive     TRIMETH/SULFA <=20 SENSITIVE Sensitive     PIP/TAZO <=4 SENSITIVE Sensitive     * ABUNDANT ENTEROBACTER CLOACAE   Stenotrophomonas maltophilia - MIC*    LEVOFLOXACIN 0.25 SENSITIVE Sensitive     TRIMETH/SULFA <=20 SENSITIVE Sensitive     * ABUNDANT STENOTROPHOMONAS MALTOPHILIA     Radiology Studies: No results found.  Scheduled Meds: . allopurinol  200 mg Oral q AM  . aspirin  81 mg Oral Daily  . atorvastatin  40 mg Oral q AM  . budesonide (PULMICORT) nebulizer solution  0.5 mg Nebulization BID  . calcium acetate  2,001 mg Oral TID WC  . chlorhexidine gluconate (MEDLINE KIT)  15 mL Mouth Rinse BID  . Chlorhexidine Gluconate Cloth  6 each Topical Q0600  . cholecalciferol  1,000 Units Oral Daily  . epoetin (EPOGEN/PROCRIT) injection  4,000 Units Intravenous Q M,W,F-HD  . furosemide  80 mg Oral Daily  . heparin  5,000 Units Subcutaneous Q8H  . insulin aspart  0-15 Units Subcutaneous Q4H  . insulin glargine  15 Units Subcutaneous QHS  . ipratropium-albuterol  3 mL Nebulization  BID  . metoprolol succinate  25 mg Oral Daily  . multivitamin  1 tablet Oral QHS  . [START ON 05/19/2021] pantoprazole  40 mg Oral Daily  . [START ON 05/19/2021] predniSONE  30 mg Oral Q breakfast   Followed by  . [START ON 05/20/2021] predniSONE  20 mg Oral Q breakfast   Followed by  . [START ON 05/21/2021] predniSONE  10 mg Oral Q breakfast  . Ensure Max Protein  11 oz Oral Daily  . triamcinolone cream   Topical BID   Continuous Infusions: . sodium chloride    . sodium chloride    . sodium chloride    . sodium chloride       LOS: 10 days   Time spent: 30 minutes. More than 50% of the time was spent in counseling/coordination of care  Lorella Nimrod, MD [facility]  If 7PM-7AM, please contact night-coverage [email or web address]  05/18/2021, 5:03 PM   This record has been created using Systems analyst. Errors have been sought and corrected,but may not always be located. Such creation errors do not reflect on the standard of care.

## 2021-05-18 NOTE — Consult Note (Signed)
Consultation Note Date: 05/18/2021   Patient Name: [name]  DOB: September 25, 1966  MRN: [id number]  Age / Sex: 55 y.o., male   PCP: [facility], [place] [facility] Referring Physician: Lorella Nimrod, MD   REASON FOR CONSULTATION:Establishing goals of care  Palliative Care consult requested for goals of care discussion in this 55 y.o. male with a medical history significant for ESRD (MWF), dCHF, COPD (3L), diabetes, hypertension, CAD, gout, and obesity. He presented to the ED from home via EMS with complaints of altered mental status, fall, and shortness of breath. It was reported he went to dialysis but unable to complete due to lethargy. During work-up potassium 7.2 and bradycardia. Patient required emergent intubation due to hypoxia. Successfully extubated on 05/14/21. Followed by Nephrology.   Clinical Assessment and Goals of Care: I have reviewed medical records including lab results, imaging, Epic notes, and MAR, received report from the bedside RN, and assessed the patient.   I met at the bedside with [name] to discuss diagnosis prognosis, GOC, EOL wishes, disposition and options.  I introduced Palliative Medicine as specialized medical care for people living with serious illness. It focuses on providing relief from the symptoms and stress of a serious illness. The goal is to improve quality of life for both the patient and the family.  We discussed a brief life review of the patient, along with his functional and nutritional status. He shares he has 2 children. Never married. 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   Prior to admission he lived in the home alone. He is able to perform all ADLs independently. States his appetite was great. Uses a cane at times for gait stability.   We discussed His current illness and what it means in the larger context of His on-going co-morbidities. Natural disease trajectory and expectations at EOL were discussed.  Patient is tearful expressing  his events during admission. He shares this was "eye opening" for him and he plans to take his health much more seriously. He states it is scary to know the events that took place and requiring intubation.   A detailed discussion was had today regarding advanced directives.  Concepts specific to code status, artifical feeding and hydration, continued IV antibiotics and rehospitalization. [name] states he does not have a documented advanced directive but plans to complete in the near future. He is aware this can be completed while hospitalized. He states his daughter [name] is his designated Media planner.   We discussed at length patient's current full code status with consideration to his current illness and co-morbidities. [name] states he is open to all heroic measures however would not want to be on machines long-term. He would not want a tracheostomy or artificial feedings.   Values and goals of care important to patient and family were attempted to be elicited.  [name] is clear in expressed wishes to continue to treat the treatable aggressively. He remains hopeful for his continued improvement with a goal of returning home. He states he is going to be more intentional with his health decisions again expressing his "eye opening" experiences this admission.     I discussed the importance of continued conversation with family and their medical providers regarding overall plan of care and treatment options, ensuring decisions are within the context of the patients values and GOCs.  Palliative Care services outpatient were explained and offered. Patient  verbalized understanding and awareness of palliative's goals and philosophy of care.  At this time he declines but aware that services may be initiated at anytime.   Questions and concerns were addressed.  The family was encouraged to call with questions or concerns.  PMT will continue to support holistically as needed.   CODE  STATUS: Full code  ADVANCE DIRECTIVES: Primary Decision Maker: Self   SYMPTOM MANAGEMENT: per attending   Palliative Prophylaxis:   Frequent Pain Assessment  PSYCHO-SOCIAL/SPIRITUAL:  Support System: Family  Desire for further Chaplaincy support: No  Additional Recommendations (Limitations, Scope, Preferences):  Full Scope Treatment  Education on palliative    PAST MEDICAL HISTORY: Past Medical History:  Diagnosis Date  . Bell's palsy   . Bell's palsy   . Cataract   . CHF (congestive heart failure) ([place])   . Chronic kidney disease    dialysis  . COPD (chronic obstructive pulmonary disease) ([place])   . Coronary artery disease   . Diabetes mellitus without complication ([place])   . Dyspnea    Wheezing  . GERD (gastroesophageal reflux disease)   . Gout   . HOH (hard of hearing)    mild  . Hypercholesterolemia   . Hypertension   . NSVT (nonsustained ventricular tachycardia) ([place])   . Obstructive sleep apnea    CPAP, O2 use continuosly at 3LPM  . Orthopnea   . Oxygen dependent    3 lpm continuous  . Pneumonia    In Past X2 most recent 1 yr ago  . Psoriasis   . Psoriasis   . Pulmonary HTN ([place])   . Renal failure     ALLERGIES:  is allergic to shellfish allergy, other, betadine [povidone iodine], and povidone-iodine.   MEDICATIONS:  Current Facility-Administered Medications  Medication Dose Route Frequency Provider Last Rate Last Admin  . 0.9 %  sodium chloride infusion  100 mL Intravenous PRN Breeze, Shantelle, NP      . 0.9 %  sodium chloride infusion  100 mL Intravenous PRN Breeze, Shantelle, NP      . 0.9 %  sodium chloride infusion  250 mL Intravenous PRN Flora Lipps, MD      . 0.9% sodium chloride infusion  1 mL/kg/hr Intravenous Continuous Corey Skains, MD      . acetaminophen (TYLENOL) tablet 650 mg  650 mg Oral Q4H PRN Rust-Chester, Huel Cote, NP   650 mg at 05/16/21 1116  . albuterol (PROVENTIL) (2.5 MG/3ML) 0.083% nebulizer solution 2.5 mg  2.5  mg Nebulization Q4H PRN Flora Lipps, MD      . allopurinol (ZYLOPRIM) tablet 200 mg  200 mg Oral q AM Rust-Chester, Britton L, NP   200 mg at 05/18/21 0930  . alteplase (CATHFLO ACTIVASE) injection 2 mg  2 mg Intracatheter Once PRN Colon Flattery, NP      . aspirin chewable tablet 81 mg  81 mg Oral Daily Rust-Chester, Britton L, NP   81 mg at 05/18/21 0929  . atorvastatin (LIPITOR) tablet 40 mg  40 mg Oral q AM Rust-Chester, Britton L, NP   40 mg at 05/18/21 0937  . budesonide (PULMICORT) nebulizer solution 0.5 mg  0.5 mg Nebulization BID Flora Lipps, MD   0.5 mg at 05/18/21 0736  . calcium acetate (PHOSLO) capsule 2,001 mg  2,001 mg Oral TID WC Kolluru, Sarath, MD   2,001 mg at 05/18/21 1209  . chlorhexidine gluconate (MEDLINE KIT) (PERIDEX) 0.12 % solution 15 mL  15 mL Mouth Rinse BID Flora Lipps, MD   15 mL at 05/18/21 0931  . Chlorhexidine Gluconate  Cloth 2 % PADS 6 each  6 each Topical Q0600 Colon Flattery, NP   6 each at 05/18/21 0502  . cholecalciferol (D-VI-SOL) 10 MCG/ML oral liquid 1,000 Units  1,000 Units Oral Daily Rust-Chester, Britton L, NP   1,000 Units at 05/18/21 0930  . cyclobenzaprine (FLEXERIL) tablet 5 mg  5 mg Oral BID PRN Rust-Chester, Britton L, NP   5 mg at 05/18/21 0930  . docusate (COLACE) 50 MG/5ML liquid 100 mg  100 mg Oral BID PRN Rust-Chester, Britton L, NP      . epoetin alfa (EPOGEN) injection 4,000 Units  4,000 Units Intravenous Q M,W,F-HD Colon Flattery, NP   4,000 Units at 05/17/21 1131  . furosemide (LASIX) tablet 80 mg  80 mg Oral Daily Rust-Chester, Britton L, NP   80 mg at 05/18/21 0929  . heparin injection 1,000 Units  1,000 Units Dialysis PRN Colon Flattery, NP   2,000 Units at 05/17/21 1024  . heparin injection 1,000-6,000 Units  1,000-6,000 Units Dialysis PRN Bradly Bienenstock, NP   2,800 Units at 05/17/21 1322  . heparin injection 5,000 Units  5,000 Units Subcutaneous Q8H Ivor Costa, MD   5,000 Units at 05/18/21 0501  . hydrALAZINE  (APRESOLINE) injection 5 mg  5 mg Intravenous Q2H PRN Ivor Costa, MD      . insulin aspart (novoLOG) injection 0-15 Units  0-15 Units Subcutaneous Q4H Rust-Chester, Britton L, NP   5 Units at 05/18/21 1209  . insulin glargine (LANTUS) injection 15 Units  15 Units Subcutaneous QHS Rust-Chester, Britton L, NP   15 Units at 05/17/21 2220  . ipratropium-albuterol (DUONEB) 0.5-2.5 (3) MG/3ML nebulizer solution 3 mL  3 mL Nebulization BID Ottie Glazier, MD   3 mL at 05/18/21 0736  . lidocaine (PF) (XYLOCAINE) 1 % injection 5 mL  5 mL Intradermal PRN Colon Flattery, NP      . lidocaine-prilocaine (EMLA) cream 1 application  1 application Topical PRN Colon Flattery, NP      . metoprolol succinate (TOPROL-XL) 24 hr tablet 25 mg  25 mg Oral Daily Paraschos, Alexander, MD   25 mg at 05/18/21 0929  . multivitamin (RENA-VIT) tablet 1 tablet  1 tablet Oral QHS Rust-Chester, Huel Cote, NP   1 tablet at 05/17/21 2221  . ondansetron (ZOFRAN) injection 4 mg  4 mg Intravenous Q6H PRN Flora Lipps, MD      . Derrill Memo ON 05/19/2021] pantoprazole (PROTONIX) EC tablet 40 mg  40 mg Oral Daily Lorella Nimrod, MD      . pentafluoroprop-tetrafluoroeth (GEBAUERS) aerosol 1 application  1 application Topical PRN Breeze, Benancio Deeds, NP      . polyethylene glycol (MIRALAX / GLYCOLAX) packet 17 g  17 g Oral Daily PRN Rust-Chester, Huel Cote, NP      . Derrill Memo ON 05/19/2021] predniSONE (DELTASONE) tablet 30 mg  30 mg Oral Q breakfast Ottie Glazier, MD       Followed by  . [START ON 05/20/2021] predniSONE (DELTASONE) tablet 20 mg  20 mg Oral Q breakfast Ottie Glazier, MD       Followed by  . [START ON 05/21/2021] predniSONE (DELTASONE) tablet 10 mg  10 mg Oral Q breakfast Aleskerov, Fuad, MD      . protein supplement (ENSURE MAX) liquid  11 oz Oral Daily Ottie Glazier, MD   11 oz at 05/17/21 1522  . triamcinolone cream (KENALOG) 0.1 % cream   Topical BID Flora Lipps, MD   Given at 05/18/21 0930    VITAL SIGNS:  BP 120/78 (BP  Location: Right Arm)   Pulse 66   Temp 97.9 F (36.6 C)   Resp 19   Ht 5' 2.99" (1.6 m)   Wt 116 kg   SpO2 95%   BMI 45.31 kg/m  Filed Weights   05/11/21 0448 05/16/21 0402 05/17/21 0353  Weight: 122 kg 112.4 kg 116 kg    Estimated body mass index is 45.31 kg/m as calculated from the following:   Height as of this encounter: 5' 2.99" (1.6 m).   Weight as of this encounter: 116 kg.  LABS: CBC:    Component Value Date/Time   WBC 23.8 (H) 05/18/2021 0557   HGB 11.5 (L) 05/18/2021 0557   HGB 10.8 (L) 04/21/2019 1334   HCT 35.2 (L) 05/18/2021 0557   HCT 36.1 (L) 04/21/2019 1334   PLT 252 05/18/2021 0557   PLT 301 04/21/2019 1334   Comprehensive Metabolic Panel:    Component Value Date/Time   NA 137 05/18/2021 0557   NA 142 04/21/2019 1334   K 4.2 05/18/2021 0557   BUN 85 (H) 05/18/2021 0557   BUN 21 04/21/2019 1334   CREATININE 6.60 (H) 05/18/2021 0557   ALBUMIN 4.3 05/08/2021 0859   ALBUMIN 4.1 04/21/2019 1334    Review of Systems  Constitutional: Positive for fatigue.  All other systems reviewed and are negative.   Physical Exam General: NAD, chronically-ill appearing Cardiovascular: regular rate and rhythm Pulmonary:  diminished bilaterally  Abdomen: soft, nontender, + bowel sounds Extremities: no edema, no joint deformities Skin: no rashes, warm and dry Neurological: AAO x3, mood appropriate   Prognosis: Unable to determine  Discharge Planning:  To Be Determined  Recommendations: . Full Code-as confirmed by patient . Continue with current plan of care, treat the treatable . Patient expressed goals to return home and hopeful for improvement. States he plans to take his health more seriously as recent hospital events "intubation" has been scary and eye opening for him.   [name] Kitchen PMT will continue to support and follow as needed. Please call team line with urgent needs.   Palliative Performance Scale: PPS 40%              Patient expressed understanding  and was in agreement with this plan.   Thank you for allowing the Palliative Medicine Team to assist in the care of this patient. Please utilize secure chat with additional questions, if there is no response within 30 minutes please call the above phone number.   Time In: 0805 Time Out: 0855 Time Total: 50 min.   Visit consisted of counseling and education dealing with the complex and emotionally intense issues of symptom management and palliative care in the setting of serious and potentially life-threatening illness.Greater than 50%  of this time was spent counseling and coordinating care related to the above assessment and plan.  Signed by:  Alda Lea, AGPCNP-BC Palliative Medicine Team  Phone: [phone] Pager: [phone] Amion: [place] Team providers are available by phone from 7am to 7pm daily and can be reached through the team cell phone.  Should this patient require assistance outside of these hours, please call the patient's attending physician.

## 2021-05-18 NOTE — Evaluation (Signed)
Occupational Therapy Evaluation Patient Details Name: [name] MRN: [id number] DOB: [birth year] Today's Date: 05/18/2021    History of Present Illness Pt is a 55 y.o. black male with end stage renal disease on hemodialysis, hypertension, diabetes mellitus type II insulin dependent, pulmonary hypertension, sleep apnea, hyperlipidemia, GERD, gout, coronary artery disease, congestive heart failure, psoariatic arthritis who is [facility] on 05/08/2021 for cardiac arrest; 12 minutes of CPR and intubated. Pt also with elevated K+ noted during admission.   Clinical Impression   [name] was seen for OT evaluation this date. Prior to hospital admission, pt was MOD I for mobility and ADLs using 3L [place], assist for IADLs. Pt lives alone with friends available PRN. Pt presents to acute OT demonstrating impaired ADL performance and functional mobility 2/2 decreased activity tolerance and functional strength/ROM/balance deficits. Pt currently requires SETUP don/doff gown seated EOB. MIN A + RW for grooming standing sinkside - assist for intermittent posterior LOBs. MIN A + RW for pericare in standing, assist for balance, ultimately requires MAX A to complete thoroughly. MIN A + RW for toilet t/f - assist for balance. Pt would benefit from skilled OT to address noted impairments and functional limitations (see below for any additional details) in order to maximize safety and independence while minimizing falls risk and caregiver burden. Upon hospital discharge, recommend STR to maximize pt safety and return to PLOF.     Follow Up Recommendations  SNF    Equipment Recommendations  3 in 1 bedside commode    Recommendations for Other Services       Precautions / Restrictions Precautions Precautions: Fall Restrictions Weight Bearing Restrictions: No      Mobility Bed Mobility Overal bed mobility: Needs Assistance Bed Mobility: Supine to Sit;Sit to Supine     Supine to sit: HOB  elevated;Min guard Sit to supine: Min guard        Transfers Overall transfer level: Needs assistance Equipment used: Rolling walker (2 wheeled) Transfers: Sit to/from Stand Sit to Stand: Min assist         General transfer comment: pt with intermittent posterior lean noted    Balance Overall balance assessment: Needs assistance Sitting-balance support: Feet supported Sitting balance-Leahy Scale: Fair     Standing balance support: Single extremity supported;During functional activity Standing balance-Leahy Scale: Poor Standing balance comment: posterior LOBs, requires assist to correct                           ADL either performed or assessed with clinical judgement   ADL Overall ADL's : Needs assistance/impaired                                       General ADL Comments: SETUP don/doff gown seated EOB. MIN A + RW for grooming standing sinkside - assist for intermittent posterior LOBs. MIN A + RW for pericare in standing, assist for balance, ultimately requires MAX A to complete thoroughly. MIN A + RW for toilet t/f - assist for balance                  Pertinent Vitals/Pain Pain Assessment: No/denies pain     Hand Dominance     Extremity/Trunk Assessment Upper Extremity Assessment Upper Extremity Assessment: Overall WFL for tasks assessed   Lower Extremity Assessment Lower Extremity Assessment: Generalized weakness   Cervical / Trunk Assessment  Cervical / Trunk Assessment: Kyphotic   Communication Communication Communication: No difficulties   Cognition Arousal/Alertness: Awake/alert Behavior During Therapy: WFL for tasks assessed/performed Overall Cognitive Status: Within Functional Limits for tasks assessed                                 General Comments: appropriate insight into deficits   General Comments  SpO2 stable in 90s on 3L [place]    Exercises Exercises: Other exercises Other Exercises Other  Exercises: Pt educated re: OT role, DME recs, d/c recs, falls prevention, ECS Other Exercises: LBD, UBD, sup<>sit, toileting, hand washing, face wahsing, tooth brushing, ~100 ft mobility, sit<>stand x2, sitting/standing balance/tolerance   Shoulder Instructions      Home Living Family/patient expects to be discharged to:: Private residence Living Arrangements: Alone Available Help at Discharge: Friend(s);Available PRN/intermittently Type of Home: Apartment Home Access: Level entry     Home Layout: One level     Bathroom Shower/Tub: Teacher, early years/pre: Standard     Home Equipment: Environmental consultant - 4 wheels;Other (comment) (3L O2 baseline)          Prior Functioning/Environment Level of Independence: Independent with assistive device(s)        Comments: Mod I with rollator walker for ambulation. Patient reports independent with ADLs. Patient does not drive and has transportation set-up for dialysis. A friend assist with groceries and other things as needed        OT Problem List: Decreased strength;Decreased range of motion;Decreased activity tolerance;Impaired balance (sitting and/or standing);Decreased safety awareness      OT Treatment/Interventions: Self-care/ADL training;Therapeutic exercise;Energy conservation;DME and/or AE instruction;Therapeutic activities;Patient/family education;Balance training    OT Goals(Current goals can be found in the care plan section) Acute Rehab OT Goals Patient Stated Goal: to improve balance/strength OT Goal Formulation: With patient Time For Goal Achievement: 06/01/21 Potential to Achieve Goals: Good ADL Goals Pt Will Perform Grooming: with modified independence;standing (c LRAD PRN) Pt Will Perform Lower Body Dressing: with modified independence;sitting/lateral leans Pt Will Transfer to Toilet: with modified independence;ambulating;regular height toilet (c LRAD PRN) Pt Will Perform Toileting - Clothing Manipulation and  hygiene: with modified independence;sit to/from stand (c LRAD PRN)  OT Frequency: Min 1X/week   Barriers to D/C: Decreased caregiver support             AM-PAC OT "6 Clicks" Daily Activity     Outcome Measure Help from another person eating meals?: None Help from another person taking care of personal grooming?: A Little Help from another person toileting, which includes using toliet, bedpan, or urinal?: A Lot Help from another person bathing (including washing, rinsing, drying)?: A Little Help from another person to put on and taking off regular upper body clothing?: A Little Help from another person to put on and taking off regular lower body clothing?: A Lot 6 Click Score: 17   End of Session Equipment Utilized During Treatment: Rolling walker;Gait belt;Oxygen Nurse Communication: Mobility status  Activity Tolerance: Patient tolerated treatment well Patient left: in bed;with call bell/phone within reach;with bed alarm set  OT Visit Diagnosis: Other abnormalities of gait and mobility (R26.89)                Time: 8315-1761 OT Time Calculation (min): 38 min Charges:  OT General Charges $OT Visit: 1 Visit OT Evaluation $OT Eval Low Complexity: 1 Low OT Treatments $Self Care/Home Management : 23-37 mins   Dessie Coma,  M.S. OTR/L  05/18/21, 1:44 PM  ascom [phone]

## 2021-05-18 NOTE — Progress Notes (Signed)
[facility]  ROUNDING NOTE   Subjective:   Patient seen resting in bed Alert and following commands Tolerating meals Denies nausea and shortness of breath Interested in discharge plan     Objective:  Vital signs in last 24 hours:  Temp:  [97.8 F (36.6 C)-98.7 F (37.1 C)] 97.9 F (36.6 C) (06/02 1117) Pulse Rate:  [66-80] 66 (06/02 1117) Resp:  [18-19] 19 (06/02 1117) BP: (120-147)/(69-89) 120/78 (06/02 1117) SpO2:  [95 %-100 %] 95 % (06/02 1117)  Weight change:  Filed Weights   05/11/21 0448 05/16/21 0402 05/17/21 0353  Weight: 122 kg 112.4 kg 116 kg    Intake/Output: I/O last 3 completed shifts: In: 83 [P.O.:420] Out: 3088 [Urine:475; Other:2613]   Intake/Output this shift:  Total I/O In: 240 [P.O.:240] Out: -   Physical Exam: General: In bed, NAD  Head: Normocephalic, atraumatic. Moist oral mucosal membranes  Eyes: Anicteric  Lungs:  Diminished bilaterally, [place] O2 3L  Heart: Regular rate and rhythm  Abdomen:  Soft, nontender, obese  Extremities: no peripheral edema.  Neurologic: Nonfocal, moving all four extremities  Skin: No lesions  Access: Right AVF, Rt trialysis cath    Basic Metabolic Panel: Recent Labs  Lab 05/12/21 0615 05/13/21 0418 05/14/21 0420 05/14/21 2233 05/15/21 0544 05/16/21 0432 05/17/21 0429 05/18/21 0557  NA 136 138 137  --  137 136 136 137  K 5.1 5.5* 5.4* 5.0 4.9 5.6* 4.3 4.2  CL 97* 99 97*  --  97* 98 99 97*  CO2 24 27 27   --  25 24 23 26   GLUCOSE 247* 205* 191*  --  142* 225* 146* 149*  BUN 79* 79* 75*  --  108* 86* 107* 85*  CREATININE 8.98* 8.38* 7.39*  --  8.99* 7.04* 8.12* 6.60*  CALCIUM 8.9 9.0 9.1  --  8.9 8.9 8.9 9.1  MG 2.1 2.0 2.0  --  2.2 2.0 2.1 2.0  PHOS 7.6* 9.3* 9.0*  --  8.1* 7.9*  --   --     Liver Function Tests: No results for input(s): AST, ALT, ALKPHOS, BILITOT, PROT, ALBUMIN in the last 168 hours. No results for input(s): LIPASE, AMYLASE in the last 168 hours. No results for  input(s): AMMONIA in the last 168 hours.  CBC: Recent Labs  Lab 05/14/21 0420 05/15/21 0544 05/16/21 0432 05/17/21 0429 05/18/21 0557  WBC 12.8* 15.7* 16.8* 17.8* 23.8*  NEUTROABS 11.5* 13.7* 15.3* 13.9* 19.7*  HGB 10.0* 9.5* 10.7* 10.2* 11.5*  HCT 32.3* 29.4* 33.1* 31.4* 35.2*  MCV 91.2 88.6 89.5 88.2 86.3  PLT 197 179 193 197 252    Cardiac Enzymes: No results for input(s): CKTOTAL, CKMB, CKMBINDEX, TROPONINI in the last 168 hours.  BNP: Invalid input(s): POCBNP  CBG: Recent Labs  Lab 05/17/21 1918 05/18/21 0042 05/18/21 0446 05/18/21 0846 05/18/21 1118  GLUCAP 382* 200* 176* 185* 245*    Microbiology: Results for orders placed or performed during the hospital encounter of 05/08/21  Resp Panel by RT-PCR (Flu A&B, Covid) Nasopharyngeal Swab     Status: None   Collection Time: 05/08/21  8:04 AM   Specimen: Nasopharyngeal Swab; Nasopharyngeal(NP) swabs in vial transport medium  Result Value Ref Range Status   SARS Coronavirus 2 by RT PCR NEGATIVE NEGATIVE Final    Comment: (NOTE) SARS-CoV-2 target nucleic acids are NOT DETECTED.  The SARS-CoV-2 RNA is generally detectable in upper respiratory specimens during the acute phase of infection. The lowest concentration of SARS-CoV-2 viral copies this  assay can detect is 138 copies/mL. A negative result does not preclude SARS-Cov-2 infection and should not be used as the sole basis for treatment or other patient management decisions. A negative result may occur with  improper specimen collection/handling, submission of specimen other than nasopharyngeal swab, presence of viral mutation(s) within the areas targeted by this assay, and inadequate number of viral copies(<138 copies/mL). A negative result must be combined with clinical observations, patient history, and epidemiological information. The expected result is Negative.  Fact Sheet for Patients:  EntrepreneurPulse.com.au  Fact Sheet for  Healthcare Providers:  IncredibleEmployment.be  This test is no t yet approved or cleared by the [place] FDA and  has been authorized for detection and/or diagnosis of SARS-CoV-2 by FDA under an Emergency Use Authorization (EUA). This EUA will remain  in effect (meaning this test can be used) for the duration of the COVID-19 declaration under Section 564(b)(1) of the Act, 21 U.S.C.section 360bbb-3(b)(1), unless the authorization is terminated  or revoked sooner.       Influenza A by PCR NEGATIVE NEGATIVE Final   Influenza B by PCR NEGATIVE NEGATIVE Final    Comment: (NOTE) The Xpert Xpress SARS-CoV-2/FLU/RSV plus assay is intended as an aid in the diagnosis of influenza from Nasopharyngeal swab specimens and should not be used as a sole basis for treatment. Nasal washings and aspirates are unacceptable for Xpert Xpress SARS-CoV-2/FLU/RSV testing.  Fact Sheet for Patients: EntrepreneurPulse.com.au  Fact Sheet for Healthcare Providers: IncredibleEmployment.be  This test is not yet approved or cleared by the [place] FDA and has been authorized for detection and/or diagnosis of SARS-CoV-2 by FDA under an Emergency Use Authorization (EUA). This EUA will remain in effect (meaning this test can be used) for the duration of the COVID-19 declaration under Section 564(b)(1) of the Act, 21 U.S.C. section 360bbb-3(b)(1), unless the authorization is terminated or revoked.  Performed at [facility], [address] [place], [place] [address]   Blood culture (single)     Status: None   Collection Time: 05/08/21  8:59 AM   Specimen: BLOOD  Result Value Ref Range Status   Specimen Description BLOOD RIGHT [facility]  Final   Special Requests   Final    BOTTLES DRAWN AEROBIC AND ANAEROBIC Blood Culture results may not be optimal due to an excessive volume of blood received in culture bottles   Culture   Final    NO GROWTH  5 DAYS Performed at [facility], [address] [place], [place] [address]    Report Status 05/13/2021 FINAL  Final  MRSA PCR Screening     Status: None   Collection Time: 05/08/21  6:50 PM   Specimen: Nasopharyngeal  Result Value Ref Range Status   MRSA by PCR NEGATIVE NEGATIVE Final    Comment:        The GeneXpert MRSA Assay (FDA approved for NASAL specimens only), is one component of a comprehensive MRSA colonization surveillance program. It is not intended to diagnose MRSA infection nor to guide or monitor treatment for MRSA infections. Performed at [facility], [address] [place], [place] [address]   Culture, Respiratory w Gram Stain     Status: None   Collection Time: 05/09/21 12:16 AM   Specimen: Tracheal Aspirate; Respiratory  Result Value Ref Range Status   Specimen Description   Final    TRACHEAL ASPIRATE Performed at [facility], [address], [place], [place] [address]    Special Requests  Final    NONE Performed at [facility], [place], [place] [address]    Gram Stain   Final    MODERATE WBC PRESENT,BOTH PMN AND MONONUCLEAR RARE GRAM POSITIVE COCCI IN PAIRS RARE GRAM VARIABLE ROD    Culture   Final    FEW Normal respiratory flora-no Staph aureus or Pseudomonas seen Performed at [facility], [address], [place], [place] [address]    Report Status 05/11/2021 FINAL  Final  Culture, Respiratory w Gram Stain     Status: None   Collection Time: 05/13/21  9:10 PM   Specimen: Tracheal Aspirate; Respiratory  Result Value Ref Range Status   Specimen Description   Final    TRACHEAL ASPIRATE Performed at [facility], [address] [place], [place] [address]    Special Requests   Final    NONE Performed at [facility], [address] [place], [place] [address]    Gram Stain   Final    FEW WBC PRESENT, PREDOMINANTLY PMN MODERATE GRAM NEGATIVE  RODS Performed at [facility], [place] [address], [place], [place] [address]    Culture   Final    ABUNDANT ENTEROBACTER CLOACAE ABUNDANT STENOTROPHOMONAS MALTOPHILIA    Report Status 05/16/2021 FINAL  Final   Organism ID, Bacteria ENTEROBACTER CLOACAE  Final   Organism ID, Bacteria STENOTROPHOMONAS MALTOPHILIA  Final      Susceptibility   Enterobacter cloacae - MIC*    CEFAZOLIN >=64 RESISTANT Resistant     CEFEPIME <=0.12 SENSITIVE Sensitive     CEFTAZIDIME <=1 SENSITIVE Sensitive     CIPROFLOXACIN <=0.25 SENSITIVE Sensitive     GENTAMICIN <=1 SENSITIVE Sensitive     IMIPENEM <=0.25 SENSITIVE Sensitive     TRIMETH/SULFA <=20 SENSITIVE Sensitive     PIP/TAZO <=4 SENSITIVE Sensitive     * ABUNDANT ENTEROBACTER CLOACAE   Stenotrophomonas maltophilia - MIC*    LEVOFLOXACIN 0.25 SENSITIVE Sensitive     TRIMETH/SULFA <=20 SENSITIVE Sensitive     * ABUNDANT STENOTROPHOMONAS MALTOPHILIA    Coagulation Studies: No results for input(s): LABPROT, INR in the last 72 hours.  Urinalysis: No results for input(s): COLORURINE, LABSPEC, PHURINE, GLUCOSEU, HGBUR, BILIRUBINUR, KETONESUR, PROTEINUR, UROBILINOGEN, NITRITE, LEUKOCYTESUR in the last 72 hours.  Invalid input(s): APPERANCEUR    Imaging: No results found.   Medications:   . sodium chloride    . sodium chloride    . sodium chloride    . sodium chloride     . allopurinol  200 mg Oral q AM  . aspirin  81 mg Oral Daily  . atorvastatin  40 mg Oral q AM  . budesonide (PULMICORT) nebulizer solution  0.5 mg Nebulization BID  . calcium acetate  2,001 mg Oral TID WC  . chlorhexidine gluconate (MEDLINE KIT)  15 mL Mouth Rinse BID  . Chlorhexidine Gluconate Cloth  6 each Topical Q0600  . cholecalciferol  1,000 Units Oral Daily  . epoetin (EPOGEN/PROCRIT) injection  4,000 Units Intravenous Q M,W,F-HD  . furosemide  80 mg Oral Daily  . heparin  5,000 Units Subcutaneous Q8H  . insulin aspart  0-15 Units Subcutaneous Q4H  .  insulin glargine  15 Units Subcutaneous QHS  . ipratropium-albuterol  3 mL Nebulization BID  . metoprolol succinate  25 mg Oral Daily  . multivitamin  1 tablet Oral QHS  . [START ON 05/19/2021] pantoprazole  40 mg Oral Daily  . [START ON 05/19/2021] predniSONE  30 mg  Oral Q breakfast   Followed by  . [START ON 05/20/2021] predniSONE  20 mg Oral Q breakfast   Followed by  . [START ON 05/21/2021] predniSONE  10 mg Oral Q breakfast  . Ensure Max Protein  11 oz Oral Daily  . triamcinolone cream   Topical BID   sodium chloride, sodium chloride, sodium chloride, acetaminophen, albuterol, alteplase, cyclobenzaprine, docusate, heparin, heparin, hydrALAZINE, lidocaine (PF), lidocaine-prilocaine, ondansetron (ZOFRAN) IV, pentafluoroprop-tetrafluoroeth, polyethylene glycol  Assessment/ Plan:  [name] is a 55 y.o. black male with end stage renal disease on hemodialysis, hypertension, diabetes mellitus type II insulin dependent, pulmonary hypertension, sleep apnea, hyperlipidemia, GERD, gout, coronary artery disease, congestive heart failure, psoariatic arthritis who is admitted to [facility] on 05/08/2021 for Hyperkalemia [E87.5] Stupor [R40.1] Acute respiratory failure with hypoxia ([place]) [J96.01] Chronic respiratory failure with hypoxia ([place]) [J96.11] ESRD on hemodialysis ([place]) [N18.6, Z99.2] Type 2 diabetes mellitus without complication, with long-term current use of insulin ([place]) [E11.9, Z79.4] Chronic congestive heart failure, unspecified heart failure type ([name]) [I50.9]  [facility] MWF [place] Right AVF 120kg  1. End Stage Renal Disease: seen and examined on hemodialysis treatment.  Received dialysis yesterday UF 2.6L removed Next treatment scheduled for tomorrow Will attempt to use AVF for treatment, if successful, will d/c trialysis cath  2. Hypertension: blood pressure elevated, 120/78 - steroid driven - furosemide.   3. Anemia of chronic kidney disease: hemoglobin 11.5. EPO with  HD treatments.   4. Secondary Hyperparathyroidism: with hyperphosphatemia.  - Continue calcium acetate.    LOS: [place] 6/2/20221:46 PM

## 2021-05-18 NOTE — Care Management Important Message (Signed)
Important Message  Patient Details  Name: [name] MRN: [id number] Date of Birth: [birth year]   Medicare Important Message Given:  Yes     Dannette Barbara 05/18/2021, 1:27 PM

## 2021-05-18 NOTE — Progress Notes (Signed)
Chaplain Maggie made follow up visit with patient after meeting him initially in ICU. Chaplain shared a [facility] poem called The Summer Day with patient. After reading the poem a conversation was prompted by the author's question, "Tell me, what is it you plan to do with your one wild and precious life?" Patient spoke of having a sense of being given a second chance and of the importance of the simple things like eating with family regularly and being together. He spoke of wanting to have phone conversations with his children instead of text messaging so he can, "just hear your voice". Patient spoke of the experience of his hospitalization being a "wake up call". He noted gratitude for simple things like sitting on the toilet and the sensation of the first ice chips he enjoyed after the tube was removed from his throat. Patient request a copy of the [employer]. Patient will deliver in follow up visit later today or tomorrow morning.

## 2021-05-19 DIAGNOSIS — E875 Hyperkalemia: Secondary | ICD-10-CM | POA: Diagnosis not present

## 2021-05-19 LAB — CBC WITH DIFFERENTIAL/PLATELET
Abs Immature Granulocytes: 0.17 10*3/uL — ABNORMAL HIGH (ref 0.00–0.07)
Basophils Absolute: 0 10*3/uL (ref 0.0–0.1)
Basophils Relative: 0 %
Eosinophils Absolute: 0 10*3/uL (ref 0.0–0.5)
Eosinophils Relative: 0 %
HCT: 35.3 % — ABNORMAL LOW (ref 39.0–52.0)
Hemoglobin: 11.3 g/dL — ABNORMAL LOW (ref 13.0–17.0)
Immature Granulocytes: 1 %
Lymphocytes Relative: 8 %
Lymphs Abs: 1.7 10*3/uL (ref 0.7–4.0)
MCH: 28.4 pg (ref 26.0–34.0)
MCHC: 32 g/dL (ref 30.0–36.0)
MCV: 88.7 fL (ref 80.0–100.0)
Monocytes Absolute: 1.4 10*3/uL — ABNORMAL HIGH (ref 0.1–1.0)
Monocytes Relative: 6 %
Neutro Abs: 19 10*3/uL — ABNORMAL HIGH (ref 1.7–7.7)
Neutrophils Relative %: 85 %
Platelets: 265 10*3/uL (ref 150–400)
RBC: 3.98 MIL/uL — ABNORMAL LOW (ref 4.22–5.81)
RDW: 13.9 % (ref 11.5–15.5)
WBC: 22.3 10*3/uL — ABNORMAL HIGH (ref 4.0–10.5)
nRBC: 0 % (ref 0.0–0.2)

## 2021-05-19 LAB — GLUCOSE, CAPILLARY
Glucose-Capillary: 135 mg/dL — ABNORMAL HIGH (ref 70–99)
Glucose-Capillary: 136 mg/dL — ABNORMAL HIGH (ref 70–99)
Glucose-Capillary: 320 mg/dL — ABNORMAL HIGH (ref 70–99)

## 2021-05-19 LAB — MAGNESIUM: Magnesium: 1.9 mg/dL (ref 1.7–2.4)

## 2021-05-19 MED ORDER — PREDNISONE 10 MG PO TABS: 10.0000 mg | ORAL_TABLET | Freq: Every day | ORAL | 0 refills | Status: DC

## 2021-05-19 MED ORDER — METOPROLOL SUCCINATE ER 25 MG PO TB24: 25.0000 mg | ORAL_TABLET | Freq: Every day | ORAL | 1 refills | Status: DC

## 2021-05-19 NOTE — Progress Notes (Signed)
Spoke with Tammy at [facility] in central telemetry to transfer patient from room 242 to room 206-1.

## 2021-05-19 NOTE — Discharge Summary (Signed)
Physician Discharge Summary  [name] YBO:[id number] DOB: 18-Oct-1966 DOA: 05/08/2021  PCP: [facility]  Admit date: 05/08/2021 Discharge date: 05/19/2021  Admitted From: Home Disposition: Home  Recommendations for Outpatient Follow-up:  1. Follow up with PCP in 1-2 weeks 2. Follow-up with cardiology 3. Follow-up with nephrology 4. Please obtain BMP/CBC in one week 5. Please follow up on the following pending results: None  Home Health: Yes Equipment/Devices: None Discharge Condition: Stable CODE STATUS: Full Diet recommendation: Heart Healthy / Carb Modified   Brief/Interim Summary: 55 y.o.malewith a history ofdCHF,ESRD on hemodialysis COPD,diabetes morbid obesity who presents to ER with severealtered mental status. Had a fall hitting the left side of his forehead yesterday. Went to dialysis on the day of admission where he was noted to be somnolent, poorly interactive, not able to follow commands. Patient later arrested, requiring CPR for 12 minutes, transferred to hospital and intubated.  Echocardiogram with EF of 50 to 55% and rest was within normal limit. Extubated on 05/14/2021.  Continue to improve and tolerating dialysis. There is some concern of tracheal colonization with stenotrophomonas Enterobacter, received empiric antibiotics during current hospitalization and there is no need for further treatment. PT is recommending SNF placement, patient declined and wants to go home with home health services which were ordered. Patient did had Foley in place for 5 days during hospitalization and it was removed before discharge. IJ HD catheter was also removed before discharge and nephrology is planning to use his fistula during subsequent dialysis.  Patient also has class III obesity with BMI of 45 and will need continuous counseling for weight reduction as it will complicate overall prognosis  Significant events. 05/12/21-patient is weaned on ventilator to Fio2  45%. Holding OGF for SBT today. He is awake RASS-0 during my evaluation. Labs reviewed with mild trend up of wbc hb stable 9.3. POC glucose 216. On unasyn day 5, blood cultures negative.   05/13/21-patient received HD. He was unable to be weaned today. Plan is for SBT as soon as possible for liberation from MV>  05/14/21- patient is extubated to BIPAP. He is lucid and expressed hunger asking for [employer] meal. Family came to bedside appreciative of care.    05/15/21-patient is post extubation. He passed his SLP today. He had HD today.  05/16/21-patient improved, will transfer to step down unti and involve TRH service starting from 6/1.  Patient remained stable since then. He will continue with rest of his home medications and follow-up with his providers.  Discharge Diagnoses:  Active Problems:   Essential hypertension   CAD (coronary artery disease)   Chronic gout of multiple sites   ESRD (end stage renal disease) on dialysis (HCC)   Acute encephalopathy   Acute on chronic respiratory failure with hypoxia (HCC)   COPD (chronic obstructive pulmonary disease) ([place])   HLD (hyperlipidemia)   Fall at home, initial encounter   Acute metabolic encephalopathy   Uremia   Elevated troponin   Leukocytosis   Type II diabetes mellitus with renal manifestations [facility])   Discharge Instructions  Discharge Instructions    Diet - low sodium heart healthy   Complete by: As directed    Discharge instructions   Complete by: As directed    It was pleasure taking care of you. Your providers make some changes to your medications, please take them as directed. Continue with your dialysis and follow-up with your doctors closely for further management.   Increase activity slowly   Complete by: As directed  No dressing needed   Complete by: As directed      Allergies as of 05/19/2021      Reactions   Shellfish Allergy Anaphylaxis   Face and throat swelling, difficulty  breathing Allergy can be triggered by touching (contact)   Other    Betadine [povidone Iodine] Rash   Povidone-iodine Rash      Medication List    STOP taking these medications   betamethasone dipropionate 0.05 % cream   calcipotriene 0.005 % cream Commonly known as: DOVONOX   carvedilol 25 MG tablet Commonly known as: COREG   Enstilar 0.005-0.064 % Foam Generic drug: Calcipotriene-Betameth Diprop   hydrALAZINE 100 MG tablet Commonly known as: APRESOLINE     TAKE these medications   albuterol 108 (90 Base) MCG/ACT inhaler Commonly known as: VENTOLIN HFA Inhale 2 puffs into the lungs every 4 (four) hours as needed for wheezing or shortness of breath.   allopurinol 100 MG tablet Commonly known as: ZYLOPRIM Take 200 mg by mouth in the morning.   Anoro Ellipta 62.5-25 MCG/INH Aepb Generic drug: umeclidinium-vilanterol INHALE 1 PUFF INTO THE LUNGS DAILY.   aspirin EC 81 MG tablet Take 81 mg by mouth in the morning. Swallow whole.   atorvastatin 40 MG tablet Commonly known as: LIPITOR Take 1 tablet (40 mg total) by mouth at bedtime. What changed: when to take this   calcium acetate 667 MG capsule Commonly known as: PHOSLO Take 667 mg by mouth 3 (three) times daily with meals.   Cholecalciferol 25 MCG (1000 UT) tablet Commonly known as: D3-1000 Take 2 tablets (2,000 Units total) by mouth daily. What changed:   how much to take  when to take this   cyclobenzaprine 5 MG tablet Commonly known as: FLEXERIL Take 1 tablet (5 mg total) by mouth 2 (two) times daily as needed for muscle spasms.   diclofenac Sodium 1 % Gel Commonly known as: VOLTAREN Apply 4 g topically 4 (four) times daily as needed. For Knee Pain   furosemide 80 MG tablet Commonly known as: LASIX Take 80 mg by mouth daily.   gabapentin 300 MG capsule Commonly known as: NEURONTIN Take 300 mg by mouth in the morning.   glimepiride 4 MG tablet Commonly known as: AMARYL Take 4 mg by mouth in  the morning.   isosorbide mononitrate 60 MG 24 hr tablet Commonly known as: IMDUR Take 60 mg by mouth daily.   magnesium oxide 400 MG tablet Commonly known as: MAG-OX Take 400 mg by mouth 2 (two) times daily.   metoprolol succinate 25 MG 24 hr tablet Commonly known as: TOPROL-XL Take 1 tablet (25 mg total) by mouth daily.   omeprazole 20 MG capsule Commonly known as: PRILOSEC Take 1 capsule (20 mg total) by mouth 2 (two) times daily before a meal. What changed: when to take this   predniSONE 10 MG tablet Commonly known as: DELTASONE Take 1 tablet (10 mg total) by mouth daily with breakfast. Take 2 tablets tomorrow, and 1 on 05/21/2021. Start taking on: May 21, 2021   Skyrizi 150 MG/ML Sosy Generic drug: Risankizumab-rzaa Inject 150 mg into the skin as directed. Every 12 weeks for maintenance. What changed:   when to take this  additional instructions   Tab-A-Vite Tabs TAKE 1 TABLET BY MOUTH ONCE DAILY. What changed: when to take this   [place] FlexTouch 100 UNIT/ML FlexTouch Pen Generic drug: insulin degludec Inject 20 Units into the skin at bedtime.   triamcinolone cream 0.1 % Commonly  known as: KENALOG Apply 1 application topically 2 (two) times daily.   Trulicity 1.5 CX/4.4YJ Sopn Generic drug: Dulaglutide Inject 1.5 sq every week What changed:   how much to take  how to take this  when to take this  additional instructions  Another medication with the same name was removed. Continue taking this medication, and follow the directions you see here.            Discharge Care Instructions  (From admission, onward)         Start     Ordered   05/19/21 0000  No dressing needed        05/19/21 1150          Follow-up Information    Corey Skains, MD Follow up in 1 week(s).   Specialty: Cardiology Contact information: [facility] [place] [address] [phone]        [facility]. Schedule an appointment as soon as possible for a visit.   Specialty: Family Medicine Contact information: [address] [place] [address] [phone]        Kate Sable, MD .   Specialties: Cardiology, Radiology Contact information: [place] [address] [phone]              Allergies  Allergen Reactions  . Shellfish Allergy Anaphylaxis    Face and throat swelling, difficulty breathing Allergy can be triggered by touching (contact)  . Other   . Betadine [Povidone Iodine] Rash  . Povidone-Iodine Rash    Consultations:  PCCM  Nephrology  Cardiology  Procedures/Studies: CT Head Wo Contrast  Result Date: 05/08/2021 CLINICAL DATA:  Altered mental status, history of fall yesterday, initial encounter EXAM: CT HEAD WITHOUT CONTRAST CT CERVICAL SPINE WITHOUT CONTRAST TECHNIQUE: Multidetector CT imaging of the head and cervical spine was performed following the standard protocol without intravenous contrast. Multiplanar CT image reconstructions of the cervical spine were also generated. COMPARISON:  01/27/2021 FINDINGS: CT HEAD FINDINGS Brain: No evidence of acute infarction, hemorrhage, hydrocephalus, extra-axial collection or mass lesion/mass effect. Chronic atrophic and ischemic changes are again identified and stable. Vascular: No hyperdense vessel or unexpected calcification. Skull: Normal. Negative for fracture or focal lesion. Sinuses/Orbits: No acute finding. Other: None. CT CERVICAL SPINE FINDINGS Alignment: Normal. Skull base and vertebrae: 7 cervical segments are well visualized. Vertebral body height is well maintained. Disc space narrowing and osteophytic changes are noted. No acute fracture or acute facet abnormality is noted. Soft tissues and spinal canal: Surrounding soft tissue structures are within normal limits. Upper chest: Visualized lung apices are within normal limits. Other: None IMPRESSION: CT of the head: Chronic  atrophic and ischemic changes without acute abnormality. CT of the cervical spine: Multilevel degenerative change without acute abnormality. Electronically Signed   By: Inez Catalina M.D.   On: 05/08/2021 08:54   CT CERVICAL SPINE WO CONTRAST  Result Date: 05/08/2021 CLINICAL DATA:  Altered mental status, history of fall yesterday, initial encounter EXAM: CT HEAD WITHOUT CONTRAST CT CERVICAL SPINE WITHOUT CONTRAST TECHNIQUE: Multidetector CT imaging of the head and cervical spine was performed following the standard protocol without intravenous contrast. Multiplanar CT image reconstructions of the cervical spine were also generated. COMPARISON:  01/27/2021 FINDINGS: CT HEAD FINDINGS Brain: No evidence of acute infarction, hemorrhage, hydrocephalus, extra-axial collection or mass lesion/mass effect. Chronic atrophic and ischemic changes are again identified and stable. Vascular: No hyperdense vessel or unexpected calcification. Skull: Normal. Negative for  fracture or focal lesion. Sinuses/Orbits: No acute finding. Other: None. CT CERVICAL SPINE FINDINGS Alignment: Normal. Skull base and vertebrae: 7 cervical segments are well visualized. Vertebral body height is well maintained. Disc space narrowing and osteophytic changes are noted. No acute fracture or acute facet abnormality is noted. Soft tissues and spinal canal: Surrounding soft tissue structures are within normal limits. Upper chest: Visualized lung apices are within normal limits. Other: None IMPRESSION: CT of the head: Chronic atrophic and ischemic changes without acute abnormality. CT of the cervical spine: Multilevel degenerative change without acute abnormality. Electronically Signed   By: Inez Catalina M.D.   On: 05/08/2021 08:54   PERIPHERAL VASCULAR CATHETERIZATION  Result Date: 05/02/2021 See Op Note  DG Chest Port 1 View  Result Date: 05/11/2021 CLINICAL DATA:  Line placement. EXAM: PORTABLE CHEST 1 VIEW COMPARISON:  Same day. FINDINGS:  Stable cardiomegaly. Endotracheal and nasogastric tubes are unchanged in position. Interval placement right internal jugular catheter with distal tip in expected position of the SVC. No pneumothorax is noted. Mild bibasilar subsegmental atelectasis is noted. Bony thorax is unremarkable. IMPRESSION: Interval placement of right internal jugular catheter with distal tip in expected position of the SVC. No pneumothorax is noted. Electronically Signed   By: Marijo Conception M.D.   On: 05/11/2021 12:39   DG Chest Port 1 View  Result Date: 05/11/2021 CLINICAL DATA:  Acute respiratory failure with hypoxia. EXAM: PORTABLE CHEST 1 VIEW COMPARISON:  May 08, 2021. FINDINGS: Endotracheal tube tip is approximately 2.8 cm above the carina. Enteric tube courses below the diaphragm in outside the field of view. Similar enlarged cardiac silhouette, accentuated by AP technique. Pulmonary vascular congestion. Patchy airspace opacities, slightly increased at the left lung base. Similar possible small left pleural effusion. No visible pneumothorax on this limited semi erect AP radiograph. IMPRESSION: 1. Similar cardiomegaly and pulmonary vascular congestion. Possible small left pleural effusion. 2. Patchy airspace opacities (slightly increased at the left lung base), which could represent edema and/or infection. Electronically Signed   By: Margaretha Sheffield MD   On: 05/11/2021 11:12   DG Chest Port 1 View  Result Date: 05/08/2021 CLINICAL DATA:  Endotracheal and orogastric tube placement. EXAM: PORTABLE CHEST 1 VIEW COMPARISON:  Radiograph earlier today FINDINGS: Endotracheal tube tip is 19 mm from the carina. Enteric tube in place with tip below the diaphragm not included in the field of view. Low lung volumes. Cardiomegaly. Increasing patchy bibasilar opacities. Possible small right pleural effusion and fluid in the fissure. Perivascular haziness. Right subclavian stent again seen. No pneumothorax. IMPRESSION: 1. Endotracheal  tube tip 19 mm from the carina. Enteric tube in place with tip below the diaphragm not included in the field of view. 2. Low lung volumes with increasing patchy bibasilar opacities. Cardiomegaly with vascular congestion. Possible small right pleural effusion. Electronically Signed   By: Keith Rake M.D.   On: 05/08/2021 19:22   DG Chest Portable 1 View  Result Date: 05/08/2021 CLINICAL DATA:  Altered mental status, weakness, right shoulder pain. EXAM: PORTABLE CHEST 1 VIEW COMPARISON:  01/27/2021 and CT chest 11/23/2016. FINDINGS: Trachea is midline. Heart is enlarged. Central interstitial prominence and indistinctness. Thickening of the minor fissure. Slight blunting of the right costophrenic angle. IMPRESSION: Mild congestive heart failure. Difficult to exclude a viral or atypical pneumonia. Electronically Signed   By: Lorin Picket M.D.   On: 05/08/2021 08:11   DG Shoulder Right Portable  Result Date: 05/08/2021 CLINICAL DATA:  Right shoulder pain Altered mental  status Weakness EXAM: PORTABLE RIGHT SHOULDER COMPARISON:  06/21/2019 FINDINGS: No fracture or dislocation. Mild spurring of the acromioclavicular joint. Multiple right axillary vascular stents are noted. IMPRESSION: Mild right AC joint osteoarthrosis. Electronically Signed   By: Miachel Roux M.D.   On: 05/08/2021 08:13   DG Abd Portable 1V  Result Date: 05/08/2021 CLINICAL DATA:  Orogastric tube placement. EXAM: PORTABLE ABDOMEN - 1 VIEW COMPARISON:  None. FINDINGS: Tip and side port of the enteric tube below the diaphragm in the stomach. Nonobstructed upper abdominal bowel gas pattern. IMPRESSION: Tip and side port of the enteric tube below the diaphragm in the stomach. Electronically Signed   By: Keith Rake M.D.   On: 05/08/2021 19:23   ECHOCARDIOGRAM COMPLETE  Result Date: 05/11/2021    ECHOCARDIOGRAM REPORT   Patient Name:   [name] Date of Exam: 05/10/2021 Medical Rec #:  [id number]          Height:       63.0  in Accession #:    [id number]         Weight:       272.5 lb Date of Birth:  18-Mar-1966          BSA:          2.205 m Patient Age:    56 years           BP:           122/64 mmHg Patient Gender: M                  HR:           69 bpm. Exam Location:  [facility] Procedure: 2D Echo, Cardiac Doppler, Color Doppler and Intracardiac            Opacification Agent Indications:     I24.9 Acute Ischemic Heart Disease  History:         Patient has prior history of Echocardiogram examinations, most                  recent 01/29/2021. Risk Factors:Diabetes, Dyslipidemia and                  Obesity. Pulmonary hypertension. Pneumonia. Obstructive sleep                  apnea-CPAP. NSVT. Dyspnea. Coronary artery disease. Congestive                  heart failure. Chronic kidney disease.  Sonographer:     Wilford Sports Rodgers-Jones Referring Phys:  [id number] Flora Lipps Diagnosing Phys: Kate Sable MD IMPRESSIONS  1. Left ventricular ejection fraction, by estimation, is 50 to 55%. The left ventricle has low normal function. The left ventricle has no regional wall motion abnormalities. There is mild left ventricular hypertrophy. Left ventricular diastolic parameters were normal.  2. Right ventricular systolic function is normal. The right ventricular size is normal.  3. The mitral valve is normal in structure. No evidence of mitral valve regurgitation.  4. The aortic valve is grossly normal. Aortic valve regurgitation is not visualized. FINDINGS  Left Ventricle: Left ventricular ejection fraction, by estimation, is 50 to 55%. The left ventricle has low normal function. The left ventricle has no regional wall motion abnormalities. Definity contrast agent was given IV to delineate the left ventricular endocardial borders. The left ventricular internal cavity size was normal in size. There is mild left ventricular hypertrophy. Left ventricular diastolic parameters were normal. Right Ventricle:  The right ventricular size is normal. No  increase in right ventricular wall thickness. Right ventricular systolic function is normal. Left Atrium: Left atrial size was normal in size. Right Atrium: Right atrial size was normal in size. Pericardium: There is no evidence of pericardial effusion. Mitral Valve: The mitral valve is normal in structure. No evidence of mitral valve regurgitation. Tricuspid Valve: The tricuspid valve is normal in structure. Tricuspid valve regurgitation is not demonstrated. Aortic Valve: The aortic valve is grossly normal. Aortic valve regurgitation is not visualized. Pulmonic Valve: The pulmonic valve was not well visualized. Pulmonic valve regurgitation is not visualized. Aorta: The aortic root is normal in size and structure. Venous: IVC assessment for right atrial pressure unable to be performed due to mechanical ventilation. IAS/Shunts: No atrial level shunt detected by color flow Doppler.  LEFT VENTRICLE PLAX 2D LVIDd:         5.95 cm  Diastology LVIDs:         4.37 cm  LV e' medial:    5.44 cm/s LV PW:         1.20 cm  LV E/e' medial:  15.2 LV IVS:        1.10 cm  LV e' lateral:   7.94 cm/s LVOT diam:     2.40 cm  LV E/e' lateral: 10.4 LV SV:         74 LV SV Index:   34 LVOT Area:     4.52 cm  RIGHT VENTRICLE             IVC RV Basal diam:  4.28 cm     IVC diam: 1.85 cm RV S prime:     14.30 cm/s TAPSE (M-mode): 2.2 cm LEFT ATRIUM             Index       RIGHT ATRIUM           Index LA diam:        4.20 cm 1.90 cm/m  RA Area:     17.80 cm LA Vol (A2C):   64.0 ml 29.02 ml/m RA Volume:   52.80 ml  23.94 ml/m LA Vol (A4C):   48.8 ml 22.13 ml/m LA Biplane Vol: 55.9 ml 25.35 ml/m  AORTIC VALVE LVOT Vmax:   84.15 cm/s LVOT Vmean:  63.700 cm/s LVOT VTI:    0.164 m  AORTA Ao Root diam: 3.70 cm Ao Asc diam:  3.10 cm MITRAL VALVE MV Area (PHT): 2.95 cm    SHUNTS MV Decel Time: 257 msec    Systemic VTI:  0.16 m MV E velocity: 82.50 cm/s  Systemic Diam: 2.40 cm MV A velocity: 85.90 cm/s MV E/A ratio:  0.96 Kate Sable MD  Electronically signed by Kate Sable MD Signature Date/Time: 05/11/2021/3:56:19 PM    Final      Subjective: Patient was seen and examined today during dialysis.  Denies any chest pain or shortness of breath.  He thinks that he is at his baseline and ready to go home.  Discharge Exam: Vitals:   05/19/21 1115 05/19/21 1130  BP: (!) 149/96 (!) 148/94  Pulse: 76 77  Resp: (!) 22 (!) 30  Temp:    SpO2:     Vitals:   05/19/21 1045 05/19/21 1100 05/19/21 1115 05/19/21 1130  BP: (!) 143/97 (!) 142/102 (!) 149/96 (!) 148/94  Pulse: 70 78 76 77  Resp: (!) 22 15 (!) 22 (!) 30  Temp:      TempSrc:  SpO2:      Weight:      Height:        General: Pt is alert, awake, not in acute distress Cardiovascular: RRR, S1/S2 +, no rubs, no gallops Respiratory: CTA bilaterally, no wheezing, no rhonchi Abdominal: Soft, NT, ND, bowel sounds + Extremities: no edema, no cyanosis   The results of significant diagnostics from this hospitalization (including imaging, microbiology, ancillary and laboratory) are listed below for reference.    Microbiology: Recent Results (from the past 240 hour(s))  Culture, Respiratory w Gram Stain     Status: None   Collection Time: 05/13/21  9:10 PM   Specimen: Tracheal Aspirate; Respiratory  Result Value Ref Range Status   Specimen Description   Final    TRACHEAL ASPIRATE Performed at [facility], [address], [place], [place] [address]    Special Requests   Final    NONE Performed at [facility], [address] [place], [place] [address]    Gram Stain   Final    FEW WBC PRESENT, PREDOMINANTLY PMN MODERATE GRAM NEGATIVE RODS Performed at [facility], [place] [address], [place],  [address]    Culture   Final    ABUNDANT ENTEROBACTER CLOACAE ABUNDANT STENOTROPHOMONAS MALTOPHILIA    Report Status 05/16/2021 FINAL  Final   Organism ID, Bacteria ENTEROBACTER CLOACAE  Final   Organism ID, Bacteria  STENOTROPHOMONAS MALTOPHILIA  Final      Susceptibility   Enterobacter cloacae - MIC*    CEFAZOLIN >=64 RESISTANT Resistant     CEFEPIME <=0.12 SENSITIVE Sensitive     CEFTAZIDIME <=1 SENSITIVE Sensitive     CIPROFLOXACIN <=0.25 SENSITIVE Sensitive     GENTAMICIN <=1 SENSITIVE Sensitive     IMIPENEM <=0.25 SENSITIVE Sensitive     TRIMETH/SULFA <=20 SENSITIVE Sensitive     PIP/TAZO <=4 SENSITIVE Sensitive     * ABUNDANT ENTEROBACTER CLOACAE   Stenotrophomonas maltophilia - MIC*    LEVOFLOXACIN 0.25 SENSITIVE Sensitive     TRIMETH/SULFA <=20 SENSITIVE Sensitive     * ABUNDANT STENOTROPHOMONAS MALTOPHILIA     Labs: BNP (last 3 results) No results for input(s): BNP in the last 8760 hours. Basic Metabolic Panel: Recent Labs  Lab 05/13/21 0418 05/14/21 0420 05/14/21 2233 05/15/21 0544 05/16/21 0432 05/17/21 0429 05/18/21 0557 05/19/21 0501  NA 138 137  --  137 136 136 137  --   K 5.5* 5.4* 5.0 4.9 5.6* 4.3 4.2  --   CL 99 97*  --  97* 98 99 97*  --   CO2 27 27  --  25 24 23 26   --   GLUCOSE 205* 191*  --  142* 225* 146* 149*  --   BUN 79* 75*  --  108* 86* 107* 85*  --   CREATININE 8.38* 7.39*  --  8.99* 7.04* 8.12* 6.60*  --   CALCIUM 9.0 9.1  --  8.9 8.9 8.9 9.1  --   MG 2.0 2.0  --  2.2 2.0 2.1 2.0 1.9  PHOS 9.3* 9.0*  --  8.1* 7.9*  --   --   --    Liver Function Tests: No results for input(s): AST, ALT, ALKPHOS, BILITOT, PROT, ALBUMIN in the last 168 hours. No results for input(s): LIPASE, AMYLASE in the last 168 hours. No results for input(s): AMMONIA in the last 168 hours. CBC: Recent Labs  Lab 05/15/21 0544 05/16/21 0432 05/17/21 0429 05/18/21 0557 05/19/21 0501  WBC 15.7* 16.8* 17.8*  23.8* 22.3*  NEUTROABS 13.7* 15.3* 13.9* 19.7* 19.0*  HGB 9.5* 10.7* 10.2* 11.5* 11.3*  HCT 29.4* 33.1* 31.4* 35.2* 35.3*  MCV 88.6 89.5 88.2 86.3 88.7  PLT 179 193 197 252 265   Cardiac Enzymes: No results for input(s): CKTOTAL, CKMB, CKMBINDEX, TROPONINI in the last 168  hours. BNP: Invalid input(s): POCBNP CBG: Recent Labs  Lab 05/18/21 1558 05/18/21 2015 05/18/21 2356 05/19/21 0405 05/19/21 0846  GLUCAP 256* 342* 255* 135* 136*   D-Dimer No results for input(s): DDIMER in the last 72 hours. Hgb A1c No results for input(s): HGBA1C in the last 72 hours. Lipid Profile No results for input(s): CHOL, HDL, LDLCALC, TRIG, CHOLHDL, LDLDIRECT in the last 72 hours. Thyroid function studies No results for input(s): TSH, T4TOTAL, T3FREE, THYROIDAB in the last 72 hours.  Invalid input(s): FREET3 Anemia work up No results for input(s): VITAMINB12, FOLATE, FERRITIN, TIBC, IRON, RETICCTPCT in the last 72 hours. Urinalysis    Component Value Date/Time   COLORURINE YELLOW 02/18/2019 1650   APPEARANCEUR Clear 08/11/2019 0958   LABSPEC 1.016 02/18/2019 1650   PHURINE 7.0 02/18/2019 1650   GLUCOSEU 2+ (A) 08/11/2019 0958   HGBUR NEGATIVE 02/18/2019 1650   BILIRUBINUR Negative 08/11/2019 0958   KETONESUR NEGATIVE 02/18/2019 1650   PROTEINUR 3+ (A) 08/11/2019 0958   PROTEINUR NEGATIVE 02/18/2019 1650   NITRITE Negative 08/11/2019 0958   NITRITE NEGATIVE 02/18/2019 1650   LEUKOCYTESUR Negative 08/11/2019 0958   LEUKOCYTESUR NEGATIVE 02/18/2019 1650   Sepsis Labs Invalid input(s): PROCALCITONIN,  WBC,  LACTICIDVEN Microbiology Recent Results (from the past 240 hour(s))  Culture, Respiratory w Gram Stain     Status: None   Collection Time: 05/13/21  9:10 PM   Specimen: Tracheal Aspirate; Respiratory  Result Value Ref Range Status   Specimen Description   Final    TRACHEAL ASPIRATE Performed at [facility], [address], [place], [place] [address]    Special Requests   Final    NONE Performed at [facility], [address] [place], [place] [address]    Gram Stain   Final    FEW WBC PRESENT, PREDOMINANTLY PMN MODERATE GRAM NEGATIVE RODS Performed at [facility], [place] [address], [place], [place] [address]     Culture   Final    ABUNDANT ENTEROBACTER CLOACAE ABUNDANT STENOTROPHOMONAS MALTOPHILIA    Report Status 05/16/2021 FINAL  Final   Organism ID, Bacteria ENTEROBACTER CLOACAE  Final   Organism ID, Bacteria STENOTROPHOMONAS MALTOPHILIA  Final      Susceptibility   Enterobacter cloacae - MIC*    CEFAZOLIN >=64 RESISTANT Resistant     CEFEPIME <=0.12 SENSITIVE Sensitive     CEFTAZIDIME <=1 SENSITIVE Sensitive     CIPROFLOXACIN <=0.25 SENSITIVE Sensitive     GENTAMICIN <=1 SENSITIVE Sensitive     IMIPENEM <=0.25 SENSITIVE Sensitive     TRIMETH/SULFA <=20 SENSITIVE Sensitive     PIP/TAZO <=4 SENSITIVE Sensitive     * ABUNDANT ENTEROBACTER CLOACAE   Stenotrophomonas maltophilia - MIC*    LEVOFLOXACIN 0.25 SENSITIVE Sensitive     TRIMETH/SULFA <=20 SENSITIVE Sensitive     * ABUNDANT STENOTROPHOMONAS MALTOPHILIA    Time coordinating discharge: Over 30 minutes  SIGNED:  Lorella Nimrod, MD  [facility] 05/19/2021, 11:51 AM  If 7PM-7AM, please contact night-coverage [email or web address]  This record has been created using Systems analyst. Errors have been sought and corrected,but may not always be located. Such creation errors do not reflect on the  standard of care.

## 2021-05-19 NOTE — TOC Transition Note (Addendum)
Transition of Care [facility]) - CM/SW Discharge Note   Patient Details  Name: [name] MRN: [id number] Date of Birth: 1966-02-27  Transition of Care [facility] CM/SW Contact:  Alberteen Sam, LCSW Phone Number: 05/19/2021, 3:24 PM   Clinical Narrative:     CSW spoke with patient regarding discharge today. Agreeable to home health PT, RN and aide. CSW has set patient up with [facility]. They are able to see patient starting on Tuesday 6/7.  Patient reports he already has a bedside commode, walker and rollator at home. Reports needing a tub bench. CSW has informed Suanne Marker with [employer] who will deliver to room prior to dc.   Patient states he receives his 3L home oxygen through [employer] and has his portable O2 with him for going home today. States his daughter will pick up at time of discharge and that staff should call her when he's ready.   CSW lvm with daughter, son said he's available at 8pm if staff is unable to get ahold of daughter. RN updated.   No other dc needs identified at this time.    Final next level of care: [place] Barriers to Discharge: Continued Medical Work up,SNF Pending bed offer   Patient Goals and CMS Choice Patient states their goals for this hospitalization and ongoing recovery are:: to go home CMS Medicare.gov Compare Post Acute Care list provided to:: Patient Choice offered to / list presented to : Patient  Discharge Placement                Patient to be transferred to facility by: daughter Name of family member notified: Courtney Patient and family notified of of transfer: 05/19/21  Discharge Plan and Services In-house Referral: Clinical Social Work   Post Acute Care Choice: [place]          DME Arranged: Tub bench DME Agency: [employer] Date DME Agency Contacted: 05/19/21 Time DME Agency Contacted: [id number] Representative spoke with at DME Agency: [place]: PT,RN,Nurse's Aide [place]:  [place] ([place]) Date [place]: 05/19/21 Time [place]: 1524 Representative spoke with at [place]: [place] ([place]) Interventions     Readmission Risk Interventions No flowsheet data found.

## 2021-05-19 NOTE — Progress Notes (Signed)
PT Cancellation Note  Patient Details Name: [name] MRN: [id number] DOB: [birth year]   Cancelled Treatment:    Reason Eval/Treat Not Completed: Other (comment). Pt attempted twice today; first attempt pt in room but headed to HD shortly. Second attempt pt with RN at bedside, declining mobility at this time (neck HD access also just removed). PT to re-attempt as able.   Lieutenant Diego PT, DPT 3:37 PM,05/19/21

## 2021-05-19 NOTE — Progress Notes (Signed)
Discharge instructions explained to pt/ verbalized an  Understanding/ iv , foley, right temp trialysis cath, and tele removed/ tolerated well/ will transport off unit via wheelchair with home 02.

## 2021-05-19 NOTE — Progress Notes (Signed)
[facility]  ROUNDING NOTE   Subjective:   Patient seen resting in bed Alert and oriented Tolerating meals No complaints  Patient seen later on dialysis   HEMODIALYSIS FLOWSHEET:  Blood Flow Rate (mL/min): 300 mL/min Arterial Pressure (mmHg): -160 mmHg Venous Pressure (mmHg): 140 mmHg Transmembrane Pressure (mmHg): 70 mmHg Ultrafiltration Rate (mL/min): 1170 mL/min Dialysate Flow Rate (mL/min): 500 ml/min Conductivity: Machine : 13.8 Conductivity: Machine : 13.8 Dialysis Fluid Bolus: Normal Saline Bolus Amount (mL): 250 mL Dialysate Change: 2K (pt is bak on 2k bath as per Dr. Juleen China)    Objective:  Vital signs in last 24 hours:  Temp:  [97.9 F (36.6 C)-98.4 F (36.9 C)] 98.4 F (36.9 C) (06/03 1030) Pulse Rate:  [62-78] 78 (06/03 1145) Resp:  [15-30] 18 (06/03 1145) BP: (141-159)/(83-102) 145/97 (06/03 1145) SpO2:  [95 %-98 %] 98 % (06/03 1030) Weight:  [115.1 kg] 115.1 kg (06/03 0410)  Weight change:  Filed Weights   05/16/21 0402 05/17/21 0353 05/19/21 0410  Weight: 112.4 kg 116 kg 115.1 kg    Intake/Output: I/O last 3 completed shifts: In: 240 [P.O.:240] Out: 450 [Other:450]   Intake/Output this shift:  Total I/O In: 240 [P.O.:240] Out: -   Physical Exam: General: In bed, NAD  Head: Normocephalic, atraumatic. Moist oral mucosal membranes  Eyes: Anicteric  Lungs:  Diminished bilaterally, [place] O2 3L  Heart: Regular rate and rhythm  Abdomen:  Soft, nontender, obese  Extremities: no peripheral edema.  Neurologic: Nonfocal, moving all four extremities  Skin: No lesions  Access: Right AVF, Rt trialysis cath    Basic Metabolic Panel: Recent Labs  Lab 05/13/21 0418 05/14/21 0420 05/14/21 2233 05/15/21 0544 05/16/21 0432 05/17/21 0429 05/18/21 0557 05/19/21 0501  NA 138 137  --  137 136 136 137  --   K 5.5* 5.4* 5.0 4.9 5.6* 4.3 4.2  --   CL 99 97*  --  97* 98 99 97*  --   CO2 27 27  --  25 24 23 26   --   GLUCOSE 205* 191*  --   142* 225* 146* 149*  --   BUN 79* 75*  --  108* 86* 107* 85*  --   CREATININE 8.38* 7.39*  --  8.99* 7.04* 8.12* 6.60*  --   CALCIUM 9.0 9.1  --  8.9 8.9 8.9 9.1  --   MG 2.0 2.0  --  2.2 2.0 2.1 2.0 1.9  PHOS 9.3* 9.0*  --  8.1* 7.9*  --   --   --     Liver Function Tests: No results for input(s): AST, ALT, ALKPHOS, BILITOT, PROT, ALBUMIN in the last 168 hours. No results for input(s): LIPASE, AMYLASE in the last 168 hours. No results for input(s): AMMONIA in the last 168 hours.  CBC: Recent Labs  Lab 05/15/21 0544 05/16/21 0432 05/17/21 0429 05/18/21 0557 05/19/21 0501  WBC 15.7* 16.8* 17.8* 23.8* 22.3*  NEUTROABS 13.7* 15.3* 13.9* 19.7* 19.0*  HGB 9.5* 10.7* 10.2* 11.5* 11.3*  HCT 29.4* 33.1* 31.4* 35.2* 35.3*  MCV 88.6 89.5 88.2 86.3 88.7  PLT 179 193 197 252 265    Cardiac Enzymes: No results for input(s): CKTOTAL, CKMB, CKMBINDEX, TROPONINI in the last 168 hours.  BNP: Invalid input(s): POCBNP  CBG: Recent Labs  Lab 05/18/21 1558 05/18/21 2015 05/18/21 2356 05/19/21 0405 05/19/21 0846  GLUCAP 256* 342* 255* 135* 136*    Microbiology: Results for orders placed or performed during the hospital encounter of 05/08/21  Resp Panel by RT-PCR (Flu A&B, Covid) Nasopharyngeal Swab     Status: None   Collection Time: 05/08/21  8:04 AM   Specimen: Nasopharyngeal Swab; Nasopharyngeal(NP) swabs in vial transport medium  Result Value Ref Range Status   SARS Coronavirus 2 by RT PCR NEGATIVE NEGATIVE Final    Comment: (NOTE) SARS-CoV-2 target nucleic acids are NOT DETECTED.  The SARS-CoV-2 RNA is generally detectable in upper respiratory specimens during the acute phase of infection. The lowest concentration of SARS-CoV-2 viral copies this assay can detect is 138 copies/mL. A negative result does not preclude SARS-Cov-2 infection and should not be used as the sole basis for treatment or other patient management decisions. A negative result may occur with  improper  specimen collection/handling, submission of specimen other than nasopharyngeal swab, presence of viral mutation(s) within the areas targeted by this assay, and inadequate number of viral copies(<138 copies/mL). A negative result must be combined with clinical observations, patient history, and epidemiological information. The expected result is Negative.  Fact Sheet for Patients:  EntrepreneurPulse.com.au  Fact Sheet for Healthcare Providers:  IncredibleEmployment.be  This test is no t yet approved or cleared by the [place] FDA and  has been authorized for detection and/or diagnosis of SARS-CoV-2 by FDA under an Emergency Use Authorization (EUA). This EUA will remain  in effect (meaning this test can be used) for the duration of the COVID-19 declaration under Section 564(b)(1) of the Act, 21 U.S.C.section 360bbb-3(b)(1), unless the authorization is terminated  or revoked sooner.       Influenza A by PCR NEGATIVE NEGATIVE Final   Influenza B by PCR NEGATIVE NEGATIVE Final    Comment: (NOTE) The Xpert Xpress SARS-CoV-2/FLU/RSV plus assay is intended as an aid in the diagnosis of influenza from Nasopharyngeal swab specimens and should not be used as a sole basis for treatment. Nasal washings and aspirates are unacceptable for Xpert Xpress SARS-CoV-2/FLU/RSV testing.  Fact Sheet for Patients: EntrepreneurPulse.com.au  Fact Sheet for Healthcare Providers: IncredibleEmployment.be  This test is not yet approved or cleared by the [place] FDA and has been authorized for detection and/or diagnosis of SARS-CoV-2 by FDA under an Emergency Use Authorization (EUA). This EUA will remain in effect (meaning this test can be used) for the duration of the COVID-19 declaration under Section 564(b)(1) of the Act, 21 U.S.C. section 360bbb-3(b)(1), unless the authorization is terminated or revoked.  Performed at  [facility], [address] [place], [place] [address]   Blood culture (single)     Status: None   Collection Time: 05/08/21  8:59 AM   Specimen: BLOOD  Result Value Ref Range Status   Specimen Description BLOOD RIGHT [facility]  Final   Special Requests   Final    BOTTLES DRAWN AEROBIC AND ANAEROBIC Blood Culture results may not be optimal due to an excessive volume of blood received in culture bottles   Culture   Final    NO GROWTH 5 DAYS Performed at [facility], [address] [place], [place] [address]    Report Status 05/13/2021 FINAL  Final  MRSA PCR Screening     Status: None   Collection Time: 05/08/21  6:50 PM   Specimen: Nasopharyngeal  Result Value Ref Range Status   MRSA by PCR NEGATIVE NEGATIVE Final    Comment:        The GeneXpert MRSA Assay (FDA approved for NASAL specimens only), is one component of a comprehensive MRSA colonization surveillance program. It is not intended  to diagnose MRSA infection nor to guide or monitor treatment for MRSA infections. Performed at [facility], [address] [place], [place] [address]   Culture, Respiratory w Gram Stain     Status: None   Collection Time: 05/09/21 12:16 AM   Specimen: Tracheal Aspirate; Respiratory  Result Value Ref Range Status   Specimen Description   Final    TRACHEAL ASPIRATE Performed at [facility], [address], [place], [place] [address]    Special Requests   Final    NONE Performed at [facility], [place], [place] [address]    Gram Stain   Final    MODERATE WBC PRESENT,BOTH PMN AND MONONUCLEAR RARE GRAM POSITIVE COCCI IN PAIRS RARE GRAM VARIABLE ROD    Culture   Final    FEW Normal respiratory flora-no Staph aureus or Pseudomonas seen Performed at [facility], [place] [address], [place], [place] [address]    Report Status 05/11/2021 FINAL  Final  Culture, Respiratory w Gram Stain     Status: None   Collection  Time: 05/13/21  9:10 PM   Specimen: Tracheal Aspirate; Respiratory  Result Value Ref Range Status   Specimen Description   Final    TRACHEAL ASPIRATE Performed at [facility], [address] [place], [place] [address]    Special Requests   Final    NONE Performed at [facility], [address] [place], [place] [address]    Gram Stain   Final    FEW WBC PRESENT, PREDOMINANTLY PMN MODERATE GRAM NEGATIVE RODS Performed at [facility], [place] [address], [place], [place] [address]    Culture   Final    ABUNDANT ENTEROBACTER CLOACAE ABUNDANT STENOTROPHOMONAS MALTOPHILIA    Report Status 05/16/2021 FINAL  Final   Organism ID, Bacteria ENTEROBACTER CLOACAE  Final   Organism ID, Bacteria STENOTROPHOMONAS MALTOPHILIA  Final      Susceptibility   Enterobacter cloacae - MIC*    CEFAZOLIN >=64 RESISTANT Resistant     CEFEPIME <=0.12 SENSITIVE Sensitive     CEFTAZIDIME <=1 SENSITIVE Sensitive     CIPROFLOXACIN <=0.25 SENSITIVE Sensitive     GENTAMICIN <=1 SENSITIVE Sensitive     IMIPENEM <=0.25 SENSITIVE Sensitive     TRIMETH/SULFA <=20 SENSITIVE Sensitive     PIP/TAZO <=4 SENSITIVE Sensitive     * ABUNDANT ENTEROBACTER CLOACAE   Stenotrophomonas maltophilia - MIC*    LEVOFLOXACIN 0.25 SENSITIVE Sensitive     TRIMETH/SULFA <=20 SENSITIVE Sensitive     * ABUNDANT STENOTROPHOMONAS MALTOPHILIA    Coagulation Studies: No results for input(s): LABPROT, INR in the last 72 hours.  Urinalysis: No results for input(s): COLORURINE, LABSPEC, PHURINE, GLUCOSEU, HGBUR, BILIRUBINUR, KETONESUR, PROTEINUR, UROBILINOGEN, NITRITE, LEUKOCYTESUR in the last 72 hours.  Invalid input(s): APPERANCEUR    Imaging: No results found.   Medications:   . sodium chloride    . sodium chloride    . sodium chloride    . sodium chloride     . allopurinol  200 mg Oral q AM  . aspirin  81 mg Oral Daily  . atorvastatin  40 mg Oral q AM  . budesonide (PULMICORT) nebulizer  solution  0.5 mg Nebulization BID  . calcium acetate  2,001 mg Oral TID WC  . chlorhexidine gluconate (MEDLINE KIT)  15 mL Mouth Rinse BID  . Chlorhexidine Gluconate Cloth  6 each Topical Q0600  . cholecalciferol  1,000 Units Oral Daily  .  furosemide  80 mg Oral Daily  . heparin  5,000 Units Subcutaneous Q8H  . insulin aspart  0-15 Units Subcutaneous Q4H  . insulin glargine  15 Units Subcutaneous QHS  . ipratropium-albuterol  3 mL Nebulization BID  . metoprolol succinate  25 mg Oral Daily  . multivitamin  1 tablet Oral QHS  . pantoprazole  40 mg Oral Daily  . [START ON 05/20/2021] predniSONE  20 mg Oral Q breakfast   Followed by  . [START ON 05/21/2021] predniSONE  10 mg Oral Q breakfast  . Ensure Max Protein  11 oz Oral Daily  . triamcinolone cream   Topical BID   sodium chloride, sodium chloride, sodium chloride, acetaminophen, albuterol, alteplase, cyclobenzaprine, docusate, heparin, heparin, hydrALAZINE, lidocaine (PF), lidocaine-prilocaine, ondansetron (ZOFRAN) IV, pentafluoroprop-tetrafluoroeth, polyethylene glycol  Assessment/ Plan:  [name] is a 55 y.o. black male with end stage renal disease on hemodialysis, hypertension, diabetes mellitus type II insulin dependent, pulmonary hypertension, sleep apnea, hyperlipidemia, GERD, gout, coronary artery disease, congestive heart failure, psoariatic arthritis who is admitted to [facility] on 05/08/2021 for Hyperkalemia [E87.5] Stupor [R40.1] Acute respiratory failure with hypoxia ([place]) [J96.01] Chronic respiratory failure with hypoxia ([place]) [J96.11] ESRD on hemodialysis ([place]) [N18.6, Z99.2] Type 2 diabetes mellitus without complication, with long-term current use of insulin ([place]) [E11.9, Z79.4] Chronic congestive heart failure, unspecified heart failure type ([place]) [I50.9]  [facility] [place] Right AVF 120kg  1. End Stage Renal Disease: seen and examined on hemodialysis treatment.  Receiving in dialysis UF goal 2L Next  treatment scheduled on Monday Attempted to use AVF but complained of soreness. Used Tempcath for dialysis. This will be removed after dialysis.  Outpatient treatment will be completed using AVF  2. Hypertension: blood pressure elevated, 148/94 - believed to be steroid driven - furosemide.   3. Anemia of chronic kidney disease: hemoglobin 11.5. EPO with HD treatments.   4. Secondary Hyperparathyroidism: with hyperphosphatemia.  - Continue calcium acetate.    LOS: [place] 6/3/202212:10 PM

## 2021-05-29 ENCOUNTER — Other Ambulatory Visit (INDEPENDENT_AMBULATORY_CARE_PROVIDER_SITE_OTHER): Payer: Self-pay | Admitting: Vascular Surgery

## 2021-05-29 DIAGNOSIS — N186 End stage renal disease: Secondary | ICD-10-CM

## 2021-05-29 DIAGNOSIS — Z95820 Peripheral vascular angioplasty status with implants and grafts: Secondary | ICD-10-CM

## 2021-05-30 ENCOUNTER — Other Ambulatory Visit: Payer: Self-pay

## 2021-05-30 ENCOUNTER — Ambulatory Visit (INDEPENDENT_AMBULATORY_CARE_PROVIDER_SITE_OTHER): Payer: Medicare Other | Admitting: Nurse Practitioner

## 2021-05-30 ENCOUNTER — Ambulatory Visit (INDEPENDENT_AMBULATORY_CARE_PROVIDER_SITE_OTHER): Payer: Medicare Other

## 2021-05-30 VITALS — BP 149/81 | HR 86 | Resp 16 | Wt 250.0 lb

## 2021-05-30 DIAGNOSIS — E1142 Type 2 diabetes mellitus with diabetic polyneuropathy: Secondary | ICD-10-CM

## 2021-05-30 DIAGNOSIS — I10 Essential (primary) hypertension: Secondary | ICD-10-CM | POA: Diagnosis not present

## 2021-05-30 DIAGNOSIS — N186 End stage renal disease: Secondary | ICD-10-CM

## 2021-05-30 DIAGNOSIS — Z95820 Peripheral vascular angioplasty status with implants and grafts: Secondary | ICD-10-CM

## 2021-06-05 ENCOUNTER — Encounter (INDEPENDENT_AMBULATORY_CARE_PROVIDER_SITE_OTHER): Payer: Self-pay | Admitting: Nurse Practitioner

## 2021-06-05 NOTE — Progress Notes (Signed)
Subjective:    Patient ID: [name], male    DOB: [birth year], 55 y.o.   MRN: [id number] Chief Complaint  Patient presents with   Follow-up    [facility] 2wk post a/v fistulagram    The patient returns to the office for followup status post intervention of the dialysis access of right brachial cephalic AV fistula. Following the intervention the access function has significantly improved, with better flow rates and improved KT/V. The patient has not been experiencing increased bleeding times following decannulation and the patient denies increased recirculation. The patient denies an increase in arm swelling. At the present time the patient denies hand pain.  The patient denies amaurosis fugax or recent TIA symptoms. There are no recent neurological changes noted. The patient denies claudication symptoms or rest pain symptoms. The patient denies history of DVT, PE or superficial thrombophlebitis. The patient denies recent episodes of angina or shortness of breath.   Flow volume of 2202. Patent right brachiocephalic AV fistua        Review of Systems  Hematological:  Does not bruise/bleed easily.  All other systems reviewed and are negative.     Objective:   Physical Exam Vitals reviewed.  HENT:     Head: Normocephalic.  Cardiovascular:     Rate and Rhythm: Normal rate.     Pulses:          Radial pulses are 2+ on the right side.     Arteriovenous access: Right arteriovenous access is present.    Comments: Good thrill and bruit Pulmonary:     Effort: Pulmonary effort is normal.     Comments: Home 02 Neurological:     Mental Status: He is alert.  Psychiatric:        Mood and Affect: Mood normal.        Behavior: Behavior normal.        Thought Content: Thought content normal.        Judgment: Judgment normal.    BP (!) 149/81 (BP Location: Left Arm)   Pulse 86   Resp 16   Wt 250 lb (113.4 kg)   BMI 44.30 kg/m   Past Medical History:  Diagnosis Date    Bell's palsy    Bell's palsy    Cataract    CHF (congestive heart failure) (HCC)    Chronic kidney disease    dialysis   COPD (chronic obstructive pulmonary disease) (HCC)    Coronary artery disease    Diabetes mellitus without complication (HCC)    Dyspnea    Wheezing   GERD (gastroesophageal reflux disease)    Gout    HOH (hard of hearing)    mild   Hypercholesterolemia    Hypertension    NSVT (nonsustained ventricular tachycardia) (HCC)    Obstructive sleep apnea    CPAP, O2 use continuosly at 3LPM   Orthopnea    Oxygen dependent    3 lpm continuous   Pneumonia    In Past X2 most recent 1 yr ago   Psoriasis    Psoriasis    Pulmonary HTN (HCC)    Renal failure     Social History   Socioeconomic History   Marital status: Single    Spouse name: Not on file   Number of children: Not on file   Years of education: Not on file   Highest education level: Not on file  Occupational History    Comment: disabled  Tobacco Use   Smoking status: Never  Smokeless tobacco: Never  Vaping Use   Vaping Use: Never used  Substance and Sexual Activity   Alcohol use: Not Currently    Alcohol/week: 1.0 standard drink    Types: 1 Cans of beer per week    Comment: occasional   Drug use: Not Currently    Types: Marijuana    Comment: in high school    Sexual activity: Not on file  Other Topics Concern   Not on file  Social History Narrative   Patient uses continuous oxygen at 3L/min   Dialysis, AV fistula in L arm   Lives at home by himself ; friend gives pt. Transportation    Social Determinants of Health   Financial Resource Strain: Not on file  Food Insecurity: Not on file  Transportation Needs: Not on file  Physical Activity: Not on file  Stress: Not on file  Social Connections: Not on file  Intimate Partner Violence: Not on file    Past Surgical History:  Procedure Laterality Date   A/V FISTULAGRAM Right 05/02/2021   Procedure: A/V FISTULAGRAM;  Surgeon:  Katha Cabal, MD;  Location: [facility];  Service: Cardiovascular;  Laterality: Right;   AV FISTULA PLACEMENT Right 11/20/2019   Procedure: ARTERIOVENOUS (AV) FISTULA CREATION ( BRACHIAL CEPHALIC );  Surgeon: Katha Cabal, MD;  Location: [facility];  Service: Vascular;  Laterality: Right;   CATARACT EXTRACTION W/PHACO Left 12/17/2019   Procedure: CATARACT EXTRACTION PHACO AND INTRAOCULAR LENS PLACEMENT ([place]) LEFT ISTENT INJ DIABETIC;  Surgeon: Eulogio Bear, MD;  Location: [facility];  Service: Ophthalmology;  Laterality: Left;  [place] 00:33.2 CDE 2.96 Fluid Pack Lot # [id number]   CATARACT EXTRACTION W/PHACO Right 02/24/2020   Procedure: CATARACT EXTRACTION PHACO AND INTRAOCULAR LENS PLACEMENT (IOC) RIGHT DIABETIC ISTENT INJ;  Surgeon: Eulogio Bear, MD;  Location: [facility];  Service: Ophthalmology;  Laterality: Right;  [place] 00:37.3 CDE 2.88 Fluid Pack Lot # [id number]   CORONARY ANGIOPLASTY WITH STENT PLACEMENT     stent placement   DIALYSIS/PERMA CATHETER INSERTION N/A 06/22/2019   Procedure: DIALYSIS/PERMA CATHETER INSERTION;  Surgeon: Algernon Huxley, MD;  Location: [facility];  Service: Cardiovascular;  Laterality: N/A;   DIALYSIS/PERMA CATHETER REMOVAL N/A 05/31/2020   Procedure: DIALYSIS/PERMA CATHETER REMOVAL;  Surgeon: Katha Cabal, MD;  Location: [facility];  Service: Cardiovascular;  Laterality: N/A;   INSERTION OF AHMED VALVE Left 12/17/2019   Procedure: INSERTION OF iSTENT;  Surgeon: Eulogio Bear, MD;  Location: [facility];  Service: Ophthalmology;  Laterality: Left;    Family History  Problem Relation Age of Onset   Heart failure Mother    Kidney failure Brother     Allergies  Allergen Reactions   Shellfish Allergy Anaphylaxis    Face and throat swelling, difficulty breathing Allergy can be triggered by touching (contact)   Other    Betadine [Povidone Iodine] Rash   Povidone-Iodine Rash    CBC Latest Ref Rng & Units 05/19/2021  05/18/2021 05/17/2021  WBC 4.0 - 10.5 K/uL 22.3(H) 23.8(H) 17.8(H)  Hemoglobin 13.0 - 17.0 g/dL 11.3(L) 11.5(L) 10.2(L)  Hematocrit 39.0 - 52.0 % 35.3(L) 35.2(L) 31.4(L)  Platelets 150 - 400 K/uL 265 252 197      CMP     Component Value Date/Time   NA 137 05/18/2021 0557   NA 142 04/21/2019 1334   K 4.2 05/18/2021 0557   CL 97 (L) 05/18/2021 0557   CO2 26 05/18/2021 0557   GLUCOSE 149 (  H) 05/18/2021 0557   BUN 85 (H) 05/18/2021 0557   BUN 21 04/21/2019 1334   CREATININE 6.60 (H) 05/18/2021 0557   CALCIUM 9.1 05/18/2021 0557   PROT 8.6 (H) 05/08/2021 0859   PROT 7.7 04/21/2019 1334   ALBUMIN 4.3 05/08/2021 0859   ALBUMIN 4.1 04/21/2019 1334   AST 13 (L) 05/08/2021 0859   ALT 16 05/08/2021 0859   ALKPHOS 145 (H) 05/08/2021 0859   BILITOT 0.9 05/08/2021 0859   BILITOT 0.6 04/21/2019 1334   GFRNONAA 9 (L) 05/18/2021 0557   GFRAA 10 (L) 11/21/2019 1853     No results found.     Assessment & Plan:   1. ESRD (end stage renal disease) ([place]) Recommend:  The patient is doing well and currently has adequate dialysis access. The patient's dialysis center is not reporting any access issues. Flow pattern is stable when compared to the prior ultrasound.  The patient should have a duplex ultrasound of the dialysis access in 6 months. The patient will follow-up with me in the office after each ultrasound     2. DM type 2 with diabetic peripheral neuropathy ([place]) Continue hypoglycemic medications as already ordered, these medications have been reviewed and there are no changes at this time.  Hgb A1C to be monitored as already arranged by primary service   3. Essential hypertension Continue antihypertensive medications as already ordered, these medications have been reviewed and there are no changes at this time.    Current Outpatient Medications on File Prior to Visit  Medication Sig Dispense Refill   albuterol (PROVENTIL HFA;VENTOLIN HFA) 108 (90 Base) MCG/ACT inhaler Inhale  2 puffs into the lungs every 4 (four) hours as needed for wheezing or shortness of breath. 6.7 g 5   allopurinol (ZYLOPRIM) 100 MG tablet Take 200 mg by mouth in the morning.     ANORO ELLIPTA 62.5-25 MCG/INH AEPB INHALE 1 PUFF INTO THE LUNGS DAILY. 60 each 11   aspirin EC 81 MG tablet Take 81 mg by mouth in the morning. Swallow whole.     atorvastatin (LIPITOR) 40 MG tablet Take 1 tablet (40 mg total) by mouth at bedtime. (Patient taking differently: Take 40 mg by mouth in the morning.) 90 tablet 3   calcium acetate (PHOSLO) 667 MG capsule Take 667 mg by mouth 3 (three) times daily with meals.     Cholecalciferol (D3-1000) 25 MCG (1000 UT) tablet Take 2 tablets (2,000 Units total) by mouth daily. (Patient taking differently: Take 1,000 Units by mouth in the morning and at bedtime.) 60 tablet 3   cyclobenzaprine (FLEXERIL) 5 MG tablet Take 1 tablet (5 mg total) by mouth 2 (two) times daily as needed for muscle spasms. 60 tablet 0   Dulaglutide (TRULICITY) 1.5 YQ/6.5HQ SOPN Inject 1.5 sq every week (Patient taking differently: Inject 1.5 mg into the skin every Monday.) 12 mL 3   furosemide (LASIX) 80 MG tablet Take 80 mg by mouth daily.     gabapentin (NEURONTIN) 300 MG capsule Take 300 mg by mouth in the morning.     glimepiride (AMARYL) 4 MG tablet Take 4 mg by mouth in the morning.     insulin degludec (TRESIBA FLEXTOUCH) 100 UNIT/ML FlexTouch Pen Inject 20 Units into the skin at bedtime. 6 mL 3   isosorbide mononitrate (IMDUR) 60 MG 24 hr tablet Take 60 mg by mouth daily.     magnesium oxide (MAG-OX) 400 MG tablet Take 400 mg by mouth 2 (two) times daily.  metoprolol succinate (TOPROL-XL) 25 MG 24 hr tablet Take 1 tablet (25 mg total) by mouth daily. 30 tablet 1   Multiple Vitamins-Minerals (TAB-A-VITE) TABS TAKE 1 TABLET BY MOUTH ONCE DAILY. (Patient taking differently: Take 1 tablet by mouth in the morning.) 90 tablet 3   omeprazole (PRILOSEC) 20 MG capsule Take 1 capsule (20 mg total) by  mouth 2 (two) times daily before a meal. (Patient taking differently: Take 20 mg by mouth in the morning.) 180 capsule 2   predniSONE (DELTASONE) 10 MG tablet Take 1 tablet (10 mg total) by mouth daily with breakfast. Take 2 tablets tomorrow, and 1 on 05/21/2021. 3 tablet 0   Risankizumab-rzaa (SKYRIZI) 150 MG/ML SOSY Inject 150 mg into the skin as directed. Every 12 weeks for maintenance. (Patient taking differently: Inject 150 mg into the skin every 4 (four) months.) 1 mL 3   diclofenac Sodium (VOLTAREN) 1 % GEL Apply 4 g topically 4 (four) times daily as needed. For Knee Pain (Patient not taking: No sig reported) 100 g 1   triamcinolone cream (KENALOG) 0.1 % Apply 1 application topically 2 (two) times daily. (Patient not taking: No sig reported)     No current facility-administered medications on file prior to visit.    There are no Patient Instructions on file for this visit. No follow-ups on file.   Kris Hartmann, NP

## 2021-06-06 ENCOUNTER — Telehealth: Payer: Self-pay

## 2021-06-06 NOTE — Telephone Encounter (Signed)
Name: [name]  MRN: [id number] DOB: February 21, 1966  Patient has been enrolled into the Transportation Program. JD 6/21   [name] DOB: Oct 15, 1966 MRN: [id number]   RIDER WAIVER AND RELEASE OF LIABILITY  For purposes of improving physical access to our facilities, [place] [facility] is pleased to partner with third parties to provide [facility] patients or other authorized individuals the option of convenient, on-demand ground transportation services (the Technical brewer") through use of the technology service that enables users to request on-demand ground transportation from independent third-party providers.  By opting to use and accept these [employer], I, the undersigned, hereby agree on behalf of myself, and on behalf of any minor child using the Government social research officer for whom I am the parent or legal guardian, as follows:  Government social research officer provided to me are provided by independent third-party transportation providers who are not [employer] or employees and who are unaffiliated with [employer]. [place] is neither a transportation carrier nor a common or public carrier. [facility] has no control over the quality or safety of the transportation that occurs as a result of the [employer]. [facility] cannot guarantee that any third-party transportation provider will complete any arranged transportation service. [facility] makes no representation, warranty, or guarantee regarding the reliability, timeliness, quality, safety, suitability, or availability of any of the Transport Services or that they will be error free. I fully understand that traveling by vehicle involves risks and dangers of serious bodily injury, including permanent disability, paralysis, and death. I agree, on behalf of myself and on behalf of any minor child using the Transport Services for whom I am the parent or legal guardian, that the entire risk arising out of my use of the  [employer] remains solely with me, to the maximum extent permitted under applicable law. The [employer] are provided "as is" and "as available." [facility] disclaims all representations and warranties, express, implied or statutory, not expressly set out in these terms, including the implied warranties of merchantability and fitness for a particular purpose. I hereby waive and release [facility], its agents, employees, officers, directors, representatives, insurers, attorneys, assigns, successors, subsidiaries, and affiliates from any and all past, present, or future claims, demands, liabilities, actions, causes of action, or suits of any kind directly or indirectly arising from acceptance and use of the [employer]. I further waive and release [facility] and its affiliates from all present and future liability and responsibility for any injury or death to persons or damages to property caused by or related to the use of the [employer]. I have read this Waiver and Release of Liability, and I understand the terms used in it and their legal significance. This Waiver is freely and voluntarily given with the understanding that my right (as well as the right of any minor child for whom I am the parent or legal guardian using the [employer]) to legal recourse against [place] in connection with the [employer] is knowingly surrendered in return for use of these services.   I attest that I read the consent document to [name], gave Mr. Berg the opportunity to ask questions and answered the questions asked (if any). I affirm that [name] then provided consent for he's participation in this program.     Cameron Proud

## 2021-06-07 NOTE — Progress Notes (Signed)
Cardiology Office Note    Date:  06/08/2021   ID:  [name], DOB Jun 19, 1966, MRN [id number]  PCP:  [facility]  Cardiologist:  Kate Sable, MD  Electrophysiologist:  None   Chief Complaint: Hospital follow up  History of Present Illness:   [name] is a 55 y.o. male with history of CAD with NSTEMI status post PCI/BMS to the RCA in 05/2015 at [facility], chronic combined systolic and diastolic CHF, ICM, chronic hypoxic respiratory failure ESRD on HD, DM2 with diabetic nephropathy, HTN, anemia of chronic disease, morbid obesity with OHS, and OSA on CPAP who presents for hospital follow-up as outlined below.  2D echo performed at [facility] in 04/2018 showed an EF of 35%, mildly enlarged LV internal cavity size, mild LVH, hypokinesis of the posterior and inferior myocardium, no significant valvular abnormalities, and a trivial pericardial effusion.  Nuclear stress test at [facility] in 04/2018 showed an area of mild decreased activity in the anterior segment stress images which improved with rest and favored to be attenuation artifact, though cannot completely exclude mild single-vessel reversible ischemia with a dilated LV at stress and rest with global hypokinesis with an EF calculated at 39%.  CT attenuation corrected images showed coronary artery calcification.  He established care with Dr. Garen Lah in 08/2019 with Lexiscan MPI at that time showing no significant ischemia with a small region of fixed apical defect felt to likely be secondary to attenuation artifact though unable to exclude a small region of old infarct, mild global hypokinesis with an EF estimated at 35%.  CT attenuation corrected images showed significant three-vessel coronary artery calcification and mild aortic atherosclerosis.  Overall, this was a low risk scan.  Echo in 09/2019 showed an EF of 45 to 50%, mildly increased LV cavity size, moderate LVH, diastolic dysfunction, normal RV systolic function, mildly  dilated left atrium, trace mitral regurgitation, trivial tricuspid regurgitation.  He was recently admitted to [facility] from 5/23 through 6/3 for acute on chronic hypoxic respiratory failure requiring mechanical ventilation and severe encephalopathy complicated by VF arrest occurring with dialysis which was treated per ACLS protocol, with 12 minutes of CPR.  High-sensitivity troponin peaked at 208.  He was consulted on by outside cardiology group on 05/10/2021.  Echo showed an EF of 50-55%, no RWMA, mild LVH, normal LV diastolic function parameters, normal RV systolic function and ventricular cavity size, and no significant valvular abnormalities. LHC was recommended when patient was more alert. He was evaluated by Dr. Quentin Ore, for consideration of ICD implantation, with the patient being very much against implantation of an ICD. His VF arrest was possibly felt to be in the setting of underlying ESRD with electrolyte abnormalities including hyperkalemia with a potassium of 7.2.  LHC and LifeVest were recommended prior to discharge by EP.  Note indicates the patient was not interested in [facility].  He was not discharged with a LifeVest.    He comes in doing reasonably well from a cardiac perspective.  No chest pain, dyspnea, palpitations, dizziness, presyncope, or syncope.  He does feel like he is starting to get his strength back following his recent hospitalization.  He has also started to put back on some weight, though his weight does remain 22 pounds down today when compared to his last office visit.  He does not recall discussing LHC or LifeVest during his recent admission.  He does recall ICD discussion and continues to be state he does not want a device implanted.  He has  tolerated metoprolol without issues.   Labs independently reviewed: 05/2021 - magnesium 1.9, Hgb 11.3, PLT 265, potassium 4.2, BUN 85, serum creatinine 6.60, TC 100, TG 117, HDL 24, LDL 53, A1c 7.1, albumin 4.3, AST/ALT not elevated 03/2021 -  TSH normal  Past Medical History:  Diagnosis Date   Anemia of chronic disease    Bell's palsy    Cataract    Chronic combined systolic (congestive) and diastolic (congestive) heart failure (HCC)    Chronic respiratory failure with hypoxia (HCC)    3 lpm continuous   COPD (chronic obstructive pulmonary disease) (HCC)    Coronary artery disease    Diabetes mellitus without complication (HCC)    Dyspnea    Wheezing   ESRD on hemodialysis (HCC)    GERD (gastroesophageal reflux disease)    Gout    HOH (hard of hearing)    mild   Hypercholesterolemia    Hypertension    NSVT (nonsustained ventricular tachycardia) (HCC)    Obstructive sleep apnea    CPAP, O2 use continuosly at 3LPM   Orthopnea    Pneumonia    In Past X2 most recent 1 yr ago   Psoriasis    Pulmonary HTN ([place])    Ventricular fibrillation [facility])     Past Surgical History:  Procedure Laterality Date   A/V FISTULAGRAM Right 05/02/2021   Procedure: A/V FISTULAGRAM;  Surgeon: Katha Cabal, MD;  Location: [place] [facility];  Service: Cardiovascular;  Laterality: Right;   AV FISTULA PLACEMENT Right 11/20/2019   Procedure: ARTERIOVENOUS (AV) FISTULA CREATION ( BRACHIAL CEPHALIC );  Surgeon: Katha Cabal, MD;  Location: [facility] ORS;  Service: Vascular;  Laterality: Right;   CATARACT EXTRACTION W/PHACO Left 12/17/2019   Procedure: CATARACT EXTRACTION PHACO AND INTRAOCULAR LENS PLACEMENT ([place]) LEFT ISTENT INJ DIABETIC;  Surgeon: Eulogio Bear, MD;  Location: [facility] ORS;  Service: Ophthalmology;  Laterality: Left;  [place] 00:33.2 CDE 2.96 Fluid Pack Lot # [id number]   CATARACT EXTRACTION W/PHACO Right 02/24/2020   Procedure: CATARACT EXTRACTION PHACO AND INTRAOCULAR LENS PLACEMENT (IOC) RIGHT DIABETIC ISTENT INJ;  Surgeon: Eulogio Bear, MD;  Location: [facility] OR[facility];  Service: Ophthalmology;  Laterality: Right;  [place] 00:37.3 CDE 2.88 Fluid Pack Lot # [id number]   CORONARY ANGIOPLASTY WITH STENT PLACEMENT     stent  placement   DIALYSIS/PERMA CATHETER INSERTION N/A 06/22/2019   Procedure: DIALYSIS/PERMA CATHETER INSERTION;  Surgeon: Algernon Huxley, MD;  Location: [place] [facility];  Service: Cardiovascular;  Laterality: N/A;   DIALYSIS/PERMA CATHETER REMOVAL N/A 05/31/2020   Procedure: DIALYSIS/PERMA CATHETER REMOVAL;  Surgeon: Katha Cabal, MD;  Location: [place] [facility];  Service: Cardiovascular;  Laterality: N/A;   INSERTION OF AHMED VALVE Left 12/17/2019   Procedure: INSERTION OF iSTENT;  Surgeon: Eulogio Bear, MD;  Location: [facility] OR[facility];  Service: Ophthalmology;  Laterality: Left;    Current Medications: Current Meds  Medication Sig   albuterol (PROVENTIL HFA;VENTOLIN HFA) 108 (90 Base) MCG/ACT inhaler Inhale 2 puffs into the lungs every 4 (four) hours as needed for wheezing or shortness of breath.   ANORO ELLIPTA 62.5-25 MCG/INH AEPB INHALE 1 PUFF INTO THE LUNGS DAILY.   aspirin EC 81 MG tablet Take 81 mg by mouth in the morning. Swallow whole.   atorvastatin (LIPITOR) 40 MG tablet Take 1 tablet (40 mg total) by mouth at bedtime.   calcium acetate (PHOSLO) 667 MG capsule Take 667 mg by mouth 3 (three) times daily with meals.   Cholecalciferol (  D3-1000) 25 MCG (1000 UT) tablet Take 2 tablets (2,000 Units total) by mouth daily.   cyclobenzaprine (FLEXERIL) 5 MG tablet Take 1 tablet (5 mg total) by mouth 2 (two) times daily as needed for muscle spasms.   Dulaglutide (TRULICITY) 1.5 OZ/3.6UY SOPN Inject 1.5 sq every week (Patient taking differently: Inject 1.5 mg into the skin every Monday.)   furosemide (LASIX) 80 MG tablet Take 80 mg by mouth. Pt only takes when he doesn't have Dialysis days.   glimepiride (AMARYL) 4 MG tablet Take 4 mg by mouth in the morning.   insulin degludec (TRESIBA FLEXTOUCH) 100 UNIT/ML FlexTouch Pen Inject 20 Units into the skin at bedtime.   magnesium oxide (MAG-OX) 400 MG tablet Take 400 mg by mouth 2 (two) times daily.   Multiple Vitamins-Minerals (TAB-A-VITE)  TABS TAKE 1 TABLET BY MOUTH ONCE DAILY.   omeprazole (PRILOSEC) 20 MG capsule Take 1 capsule (20 mg total) by mouth 2 (two) times daily before a meal.   Risankizumab-rzaa (SKYRIZI) 150 MG/ML SOSY Inject 150 mg into the skin as directed. Every 12 weeks for maintenance. (Patient taking differently: Inject 150 mg into the skin every 4 (four) months.)   triamcinolone (KENALOG) 0.025 % cream Apply 1 application topically daily in the afternoon.   [DISCONTINUED] furosemide (LASIX) 80 MG tablet Take 80 mg by mouth daily.    Allergies:   Shellfish allergy, Other, Betadine [povidone iodine], and Povidone-iodine   Social History   Socioeconomic History   Marital status: Single    Spouse name: Not on file   Number of children: Not on file   Years of education: Not on file   Highest education level: Not on file  Occupational History    Comment: disabled  Tobacco Use   Smoking status: Never   Smokeless tobacco: Never  Vaping Use   Vaping Use: Never used  Substance and Sexual Activity   Alcohol use: Not Currently    Alcohol/week: 1.0 standard drink    Types: 1 Cans of beer per week    Comment: occasional   Drug use: Not Currently    Types: Marijuana    Comment: in high school    Sexual activity: Not on file  Other Topics Concern   Not on file  Social History Narrative   Patient uses continuous oxygen at 3L/min   Dialysis, AV fistula in L arm   Lives at home by himself ; friend gives pt. Transportation    Social Determinants of Health   Financial Resource Strain: Not on file  Food Insecurity: Not on file  Transportation Needs: Not on file  Physical Activity: Not on file  Stress: Not on file  Social Connections: Not on file     Family History:  The patient's family history includes Heart failure in his mother; Kidney failure in his brother.  ROS:   Review of Systems  Constitutional:  Positive for malaise/fatigue. Negative for chills, diaphoresis, fever and weight loss.        Improving fatigue  HENT:  Negative for congestion.   Eyes:  Negative for discharge and redness.  Respiratory:  Negative for cough, sputum production, shortness of breath and wheezing.   Cardiovascular:  Negative for chest pain, palpitations, orthopnea, claudication, leg swelling and PND.  Gastrointestinal:  Negative for abdominal pain, blood in stool, heartburn, melena, nausea and vomiting.  Musculoskeletal:  Negative for falls and myalgias.  Skin:  Negative for rash.  Neurological:  Negative for dizziness, tingling, tremors, sensory change, speech change,  focal weakness, loss of consciousness and weakness.  Endo/Heme/Allergies:  Does not bruise/bleed easily.  Psychiatric/Behavioral:  Negative for substance abuse. The patient is not nervous/anxious.   All other systems reviewed and are negative.   EKGs/Labs/Other Studies Reviewed:    Studies reviewed were summarized above. The additional studies were reviewed today:  2D echo 05/10/2021: 1. Left ventricular ejection fraction, by estimation, is 50 to 55%. The  left ventricle has low normal function. The left ventricle has no regional  wall motion abnormalities. There is mild left ventricular hypertrophy.  Left ventricular diastolic  parameters were normal.   2. Right ventricular systolic function is normal. The right ventricular  size is normal.   3. The mitral valve is normal in structure. No evidence of mitral valve  regurgitation.   4. The aortic valve is grossly normal. Aortic valve regurgitation is not  visualized.  __________  2D echo 01/29/2021: 1. Left ventricular ejection fraction, by estimation, is 55 to 60%. The  left ventricle has normal function. The left ventricle has no regional  wall motion abnormalities. There is moderate left ventricular hypertrophy.  Left ventricular diastolic  parameters are consistent with Grade II diastolic dysfunction  (pseudonormalization). Elevated left ventricular end-diastolic pressure.    2. Right ventricular systolic function is normal. The right ventricular  size is normal.   3. The mitral valve is normal in structure. No evidence of mitral valve  regurgitation. No evidence of mitral stenosis.   4. The aortic valve was not well visualized. Aortic valve regurgitation  is not visualized. Mild to moderate aortic valve sclerosis/calcification  is present, without any evidence of aortic stenosis.   5. The inferior vena cava is normal in size with greater than 50%  respiratory variability, suggesting right atrial pressure of 3 mmHg. __________  2D echo 09/29/2019: 1. Left ventricular ejection fraction, by visual estimation, is 45 to  50%. The left ventricle has mildly decreased function. Mildly increased  left ventricular size. There is moderately increased left ventricular  hypertrophy.   2. Elevated mean left atrial pressure.   3. Left ventricular diastolic Doppler parameters are consistent with  pseudonormalization pattern of LV diastolic filling.   4. Global right ventricle has normal systolic function.The right  ventricular size is not well visualized. No increase in right ventricular  wall thickness.   5. Left atrial size was mildly dilated.   6. Right atrial size was not well visualized.   7. The pericardium was not well visualized.   8. The mitral valve is grossly normal. Trace mitral valve regurgitation.   9. The tricuspid valve is not well visualized. Tricuspid valve  regurgitation is trivial.  10. The aortic valve was not well visualized Aortic valve regurgitation  was not visualized by color flow Doppler. There is no aortic valve  stenosis.  11. The pulmonic valve was not well visualized. Pulmonic valve  regurgitation is not visualized by color flow Doppler.  12. TR signal is inadequate for assessing pulmonary artery systolic  pressure.  13. The inferior vena cava is normal in size with greater than 50%  respiratory variability, suggesting right atrial  pressure of 3 mmHg.  14. The interatrial septum was not well visualized.  __________  Carl[facility] Adam MPI 08/2019: Pharmacological myocardial perfusion imaging study with no significant  Ischemia Small region fixed apical defect, likely secondary to attenuation artifact though unable to exclude small region of old infarct GI uptake artifact noted Mild global hypokinesis, EF estimated at 35% (depressed EF possibly exaggerated by GI  uptake artifact) No EKG changes concerning for ischemia at peak stress or in recovery. Resting EKG with nonspecific ST abnormality CT attenuation corrected images with significant three-vessel coronary calcification, mild aortic atherosclerosis Low risk scan.   __________  2D echo 06/16/2019: 1. The left ventricle has low normal systolic function, with an ejection  fraction of 50-55%. The cavity size was normal. There is mildly increased  left ventricular wall thickness. Left ventricular diastolic parameters  were normal.   2. The right ventricle has normal systolic function. The cavity was  normal. There is no increase in right ventricular wall thickness.   3. The aortic valve is tricuspid. Aortic valve regurgitation was not  assessed by color flow Doppler.    EKG:  EKG is ordered today.  The EKG ordered today demonstrates NSR, 88 bpm, occasional PACs, poor R wave progression along the precordial leads, no acute ST-T changes  Recent Labs: 05/08/2021: ALT 16 05/18/2021: BUN 85; Creatinine, Ser 6.60; Potassium 4.2; Sodium 137 05/19/2021: Hemoglobin 11.3; Magnesium 1.9; Platelets 265  Recent Lipid Panel    Component Value Date/Time   CHOL 100 05/09/2021 0453   CHOL 116 04/21/2019 1334   TRIG 117 05/09/2021 0453   HDL 24 (L) 05/09/2021 0453   HDL 37 (L) 04/21/2019 1334   CHOLHDL 4.2 05/09/2021 0453   VLDL 23 05/09/2021 0453   LDLCALC 53 05/09/2021 0453   LDLCALC 63 04/21/2019 1334    PHYSICAL EXAM:    VS:  BP 110/78 (BP Location: Left Arm, Patient Position:  Sitting, Cuff Size: Large)   Pulse 88   Ht 5\' 3"  (1.6 m)   Wt 248 lb 2 oz (112.5 kg)   SpO2 98%   BMI 43.95 kg/m   BMI: Body mass index is 43.95 kg/m.  Physical Exam Vitals reviewed.  Constitutional:      Appearance: He is well-developed. He is obese.  HENT:     Head: Normocephalic and atraumatic.  Eyes:     General:        Right eye: No discharge.        Left eye: No discharge.  Neck:     Vascular: No JVD.  Cardiovascular:     Rate and Rhythm: Normal rate and regular rhythm.     Pulses:          Posterior tibial pulses are 2+ on the right side and 2+ on the left side.     Heart sounds: Normal heart sounds, S1 normal and S2 normal. Heart sounds not distant. No midsystolic click and no opening snap. No murmur heard.   No friction rub.  Pulmonary:     Effort: Pulmonary effort is normal. No respiratory distress.     Breath sounds: Normal breath sounds. No decreased breath sounds, wheezing or rales.  Chest:     Chest wall: No tenderness.  Abdominal:     General: There is no distension.     Palpations: Abdomen is soft.     Tenderness: There is no abdominal tenderness.  Musculoskeletal:     Cervical back: Normal range of motion.  Skin:    General: Skin is warm and dry.     Nails: There is no clubbing.  Neurological:     Mental Status: He is alert and oriented to person, place, and time.  Psychiatric:        Speech: Speech normal.        Behavior: Behavior normal.        Thought Content: Thought content normal.  Judgment: Judgment normal.    Wt Readings from Last 3 Encounters:  06/08/21 248 lb 2 oz (112.5 kg)  05/30/21 250 lb (113.4 kg)  05/19/21 240 lb 11.2 oz (109.2 kg)     ASSESSMENT & PLAN:   CAD involving the native coronary arteries without angina: Currently without symptoms concerning for angina.  Lexiscan MPI in 08/2019 showed no significant ischemia with significant three-vessel coronary artery calcification noted.  LHC was recommended by  interventional cardiology and EP during recent admission given his VF arrest with documentation indicating the patient declined this.  He indicates he does not recall this discussion while admitted, which may be in the setting of transient anoxic brain injury, sedation, and his acute illness.  In the outpatient setting, he is more lucid, and in this setting, we had a long and frank discussion today regarding further work-up of his CAD/VF arrest including recommendation to proceed with diagnostic R/LHC as well as LifeVest/ICD implantation.  He continues to indicate he does not want to move forward with an ICD.  I do think he would benefit from a LifeVest as a bridge for protection while ischemic evaluation is undertaken and while waiting for his EP follow-up.  He would like to think about this and discuss cardiac cath and LifeVest with his family.  He indicates he will call us back with his decision.  We had a frank discussion regarding potential death without LifeVest/ICD and he understands and accepts this risk.  Continue secondary prevention and current medical therapy including aspirin, atorvastatin, and metoprolol.  Recommend continued discussion for LHC in follow-up.  Chronic combined systolic and diastolic CHF/ICM: Volume status is difficult to assess on physical exam secondary to body habitus.  NYHA class is difficult to assess secondary to sedentary lifestyle and in the context of significant deconditioning following his recent admission though suspect this is NYHA class III.  Prior echo at [facility] in 2019 showed a cardiomyopathy with an EF of 35%, with subsequent improvement in LV systolic function by echo in 05/2019 to 50 to 55%.  Subsequent echoes thereafter have demonstrated an EF that has ranged from 45 to 60%, with most recent echo in 04/2021 showing a low normal LV systolic function with an EF of 50 to 55%.  Continue current GDMT including metoprolol and furosemide with fluid status largely maintained  through hemodialysis.  Not currently on ACE inhibitor/ARB/MRA/ARNI/SGLT2i secondary to ESRD.  If BP allows, consider addition of BiDil in follow-up.  CHF education.  History of VF arrest: Evaluated by EP during admission with patient declination of ICD.  I have recommended we prescribe him a LifeVest today and that we proceed with diagnostic R/LHC as well as follow-up with EP.  He indicates he would like to think on these and let us know if he would like to move forward.  He was made aware of the risk of cardiopulmonary death and understands/accepts this.  In the outpatient setting he is lucid and recommend we continue to have discussions with him regarding further management of his CAD and arrhythmia.  Continue metoprolol, relative hypotension associated with hemodialysis precludes escalation at this time.  Potassium and magnesium normal.  He will need to follow-up with EP for further discussion regarding ICD implantation.  HTN: Blood pressure is well controlled in the office today.  Continue metoprolol and Lasix.  Low-sodium diet recommended.  HLD: LDL 53 in 05/2021.  He remains on atorvastatin.  ESRD: HD per nephrology.  Anemia of chronic disease: Low though stable.  Management per PCP and nephrology with EPO during HD treatments.  Morbid obesity with OSA and OHS: His weight is down 22 pounds when compared to his last in person clinic visit in 12/2020 which is likely in the setting of his recent hospitalization.  Continued weight loss is recommended.  Recommend continued adherence with CPAP.  Chronic hypoxic respiratory failure: Stable.  Follow-up with PCP as directed.  Disposition: F/u with Dr. Garen Lah or an APP in 1-2 weeks to further discuss LifeVest, ICD, and LHC with patient and hopefully family.   Medication Adjustments/Labs and Tests Ordered: Current medicines are reviewed at length with the patient today.  Concerns regarding medicines are outlined above. Medication changes, Labs and  Tests ordered today are summarized above and listed in the Patient Instructions accessible in Encounters.   Signed, Christell Faith, PA-C 06/08/2021 1:14 PM     [address], [place] [address] [phone]

## 2021-06-08 ENCOUNTER — Encounter: Payer: Self-pay | Admitting: Physician Assistant

## 2021-06-08 ENCOUNTER — Ambulatory Visit (INDEPENDENT_AMBULATORY_CARE_PROVIDER_SITE_OTHER): Payer: Medicare Other | Admitting: Physician Assistant

## 2021-06-08 ENCOUNTER — Other Ambulatory Visit: Payer: Self-pay

## 2021-06-08 VITALS — BP 110/78 | HR 88 | Ht 63.0 in | Wt 248.1 lb

## 2021-06-08 DIAGNOSIS — N186 End stage renal disease: Secondary | ICD-10-CM

## 2021-06-08 DIAGNOSIS — Z992 Dependence on renal dialysis: Secondary | ICD-10-CM

## 2021-06-08 DIAGNOSIS — I4901 Ventricular fibrillation: Secondary | ICD-10-CM | POA: Diagnosis not present

## 2021-06-08 DIAGNOSIS — G4733 Obstructive sleep apnea (adult) (pediatric): Secondary | ICD-10-CM

## 2021-06-08 DIAGNOSIS — I2510 Atherosclerotic heart disease of native coronary artery without angina pectoris: Secondary | ICD-10-CM | POA: Diagnosis not present

## 2021-06-08 DIAGNOSIS — Z9989 Dependence on other enabling machines and devices: Secondary | ICD-10-CM

## 2021-06-08 DIAGNOSIS — J9611 Chronic respiratory failure with hypoxia: Secondary | ICD-10-CM

## 2021-06-08 DIAGNOSIS — I255 Ischemic cardiomyopathy: Secondary | ICD-10-CM

## 2021-06-08 DIAGNOSIS — I5042 Chronic combined systolic (congestive) and diastolic (congestive) heart failure: Secondary | ICD-10-CM | POA: Diagnosis not present

## 2021-06-08 DIAGNOSIS — D638 Anemia in other chronic diseases classified elsewhere: Secondary | ICD-10-CM

## 2021-06-08 DIAGNOSIS — I10 Essential (primary) hypertension: Secondary | ICD-10-CM

## 2021-06-08 DIAGNOSIS — E662 Morbid (severe) obesity with alveolar hypoventilation: Secondary | ICD-10-CM

## 2021-06-08 DIAGNOSIS — E785 Hyperlipidemia, unspecified: Secondary | ICD-10-CM

## 2021-06-08 NOTE — Patient Instructions (Signed)
Medication Instructions:  No changes at this time.  *If you need a refill on your cardiac medications before your next appointment, please call your pharmacy*   Lab Work: None  If you have labs (blood work) drawn today and your tests are completely normal, you will receive your results only by: [place] (if you have MyChart) OR A paper copy in the mail If you have any lab test that is abnormal or we need to change your treatment, we will call you to review the results.   Testing/Procedures: None   Follow-Up: At [facility], you and your health needs are our priority.  As part of our continuing mission to provide you with exceptional heart care, we have created designated Provider Care Teams.  These Care Teams include your primary Cardiologist (physician) and Advanced Practice Providers (APPs -  Physician Assistants and Nurse Practitioners) who all work together to provide you with the care you need, when you need it.   Your next appointment:   July 7 th at 1:30 pm with Christell Faith PA-C  The format for your next appointment:   In Person  Provider:   Christell Faith, PA-C

## 2021-06-09 NOTE — Addendum Note (Signed)
Addended by: Britt Bottom on: 06/09/2021 10:42 AM   Modules accepted: Orders

## 2021-06-22 ENCOUNTER — Ambulatory Visit: Payer: Medicare Other | Admitting: Physician Assistant

## 2021-06-23 ENCOUNTER — Encounter: Payer: Self-pay | Admitting: Physician Assistant

## 2021-07-06 ENCOUNTER — Telehealth: Payer: Self-pay | Admitting: Cardiology

## 2021-07-06 NOTE — Telephone Encounter (Addendum)
[name] FYI:  Patient calling to schedule asap appt with [facility] s/p admission for Cardiac arrest but seen by [name] 6/23 as noted for same admission.  Patient is not sure why but the hospital arranged for him to see [name] on admission and he does not agree with that decision and is happy with the care he has had from Dr. Mylo Brown - [name]    Patient feels a sense of urgency in eval from his doctor instead of [name]      Patient aware and agrees to call with questions about dc or specific concerns if arise while waiting on a visit .       Scheduling :  Scheduled 8/4 used 7 day hold and added to waitlist . Needs MWF pm TTH anytime  Patient states he will need [employer] transportation .

## 2021-07-07 NOTE — Telephone Encounter (Signed)
Noted  

## 2021-07-13 ENCOUNTER — Other Ambulatory Visit: Payer: Self-pay

## 2021-07-13 ENCOUNTER — Encounter: Payer: Self-pay | Admitting: Podiatry

## 2021-07-13 ENCOUNTER — Ambulatory Visit (INDEPENDENT_AMBULATORY_CARE_PROVIDER_SITE_OTHER): Payer: Medicare Other | Admitting: Podiatry

## 2021-07-13 ENCOUNTER — Other Ambulatory Visit: Payer: Self-pay | Admitting: Adult Health

## 2021-07-13 DIAGNOSIS — I872 Venous insufficiency (chronic) (peripheral): Secondary | ICD-10-CM

## 2021-07-13 DIAGNOSIS — M79676 Pain in unspecified toe(s): Secondary | ICD-10-CM

## 2021-07-13 DIAGNOSIS — B351 Tinea unguium: Secondary | ICD-10-CM | POA: Diagnosis not present

## 2021-07-13 DIAGNOSIS — E1142 Type 2 diabetes mellitus with diabetic polyneuropathy: Secondary | ICD-10-CM

## 2021-07-13 DIAGNOSIS — N186 End stage renal disease: Secondary | ICD-10-CM | POA: Diagnosis not present

## 2021-07-13 NOTE — Progress Notes (Signed)
This patient returns to my office for at risk foot care.  This patient requires this care by a professional since this patient will be at risk due to having diabetes mellitus and ESRD.  This patient is unable to cut nails himself since the patient cannot reach his nails.These nails are painful walking and wearing shoes.  This patient presents for at risk foot care today. ° °General Appearance  Alert, conversant and in no acute stress. ° °Vascular  Dorsalis pedis and posterior tibial  pulses are weakly  palpable due to swelling. bilaterally.  Capillary return is within normal limits  bilaterally. Temperature is within normal limits  Bilaterally.  Venous stasis  B/L. ° °Neurologic  Senn-Weinstein monofilament wire test within normal limits  bilaterally. Muscle power within normal limits bilaterally. ° °Nails Thick disfigured discolored nails with subungual debris  from hallux to fifth toes bilaterally. No evidence of bacterial infection or drainage bilaterally. ° °Orthopedic  No limitations of motion  feet .  No crepitus or effusions noted.  No bony pathology or digital deformities noted. ° °Skin  normotropic skin with no porokeratosis noted bilaterally.  No signs of infections or ulcers noted.  Punctate psoriasis plantar aspect feet  B/l.  ° °Onychomycosis  Pain in right toes  Pain in left toes ° °Consent was obtained for treatment procedures.   Mechanical debridement of nails 1-5  bilaterally performed with a nail nipper.  Filed with dremel without incident.  ° ° °Return office visit  10 weeks                   Told patient to return for periodic foot care and evaluation due to potential at risk complications. ° ° °[place] DPM  °

## 2021-07-20 ENCOUNTER — Telehealth: Payer: Self-pay

## 2021-07-20 ENCOUNTER — Other Ambulatory Visit: Payer: Self-pay

## 2021-07-20 ENCOUNTER — Encounter: Payer: Self-pay | Admitting: Cardiology

## 2021-07-20 ENCOUNTER — Ambulatory Visit (INDEPENDENT_AMBULATORY_CARE_PROVIDER_SITE_OTHER): Payer: Medicare Other | Admitting: Cardiology

## 2021-07-20 ENCOUNTER — Other Ambulatory Visit: Payer: Self-pay | Admitting: Cardiology

## 2021-07-20 VITALS — BP 120/80 | HR 86 | Ht 63.0 in | Wt 252.4 lb

## 2021-07-20 DIAGNOSIS — I2510 Atherosclerotic heart disease of native coronary artery without angina pectoris: Secondary | ICD-10-CM

## 2021-07-20 DIAGNOSIS — I10 Essential (primary) hypertension: Secondary | ICD-10-CM

## 2021-07-20 DIAGNOSIS — I4901 Ventricular fibrillation: Secondary | ICD-10-CM | POA: Diagnosis not present

## 2021-07-20 NOTE — Progress Notes (Signed)
Cardiology Office Note:    Date:  07/20/2021   ID:  [name], DOB December 26, 1965, MRN [id number]  PCP:  [facility]  Cardiologist:  Kate Sable, MD  Electrophysiologist:  None    Chief Complaint  Patient presents with   Follow-up    cardiac arrest; discuss LifeVest, ICD. "Doing well." Medications reviewed by the patient verbally.     History of Present Illness:    [name] is a 55 y.o. male with a hx of CAD/PCI to RCA 2019, VF arrest 04/2021, hypertension, diabetes, CKD on dialysis 3 times a week, OSA, COPD, congestive heart failure diagnosed 2019, who presents for follow-up.    Patient presented to the hospital 04/2021 after a fall.  Was diagnosed with hypoxia, intubated for airway protection.  Potassium was elevated around 7.  During hospitalization, he was getting dialyzed when he suddenly lost pulse, monitor showed ventricular fibrillation.  ACLS protocol was initiated, received 2 shocks with ROSC.  Evaluated by EP during hospitalization, declined defibrillator.  Patient denies being offered left heart cath although notes state this.  He currently feels well, has no concerns at this time.  Coreg was stopped at discharge due to bradycardia with pulse in the 30s.   Prior notes echo on 09/2019 showed mildly reduced EF 45 to 50%,  Lexiscan 08/2019 did not show any evidence for ischemia.    Past Medical History:  Diagnosis Date   Anemia of chronic disease    Bell's palsy    Cataract    Chronic combined systolic (congestive) and diastolic (congestive) heart failure (HCC)    Chronic respiratory failure with hypoxia (HCC)    3 lpm continuous   COPD (chronic obstructive pulmonary disease) (HCC)    Coronary artery disease    Diabetes mellitus without complication (HCC)    Dyspnea    Wheezing   ESRD on hemodialysis (HCC)    GERD (gastroesophageal reflux disease)    Gout    HOH (hard of hearing)    mild   Hypercholesterolemia    Hypertension    NSVT  (nonsustained ventricular tachycardia) (HCC)    Obstructive sleep apnea    CPAP, O2 use continuosly at 3LPM   Orthopnea    Pneumonia    In Past X2 most recent 1 yr ago   Psoriasis    Pulmonary HTN ([place])    Ventricular fibrillation [facility])     Past Surgical History:  Procedure Laterality Date   A/V FISTULAGRAM Right 05/02/2021   Procedure: A/V FISTULAGRAM;  Surgeon: Katha Cabal, MD;  Location: [facility];  Service: Cardiovascular;  Laterality: Right;   AV FISTULA PLACEMENT Right 11/20/2019   Procedure: ARTERIOVENOUS (AV) FISTULA CREATION ( BRACHIAL CEPHALIC );  Surgeon: Katha Cabal, MD;  Location: [facility] OR[facility];  Service: Vascular;  Laterality: Right;   CATARACT EXTRACTION W/PHACO Left 12/17/2019   Procedure: CATARACT EXTRACTION PHACO AND INTRAOCULAR LENS PLACEMENT ([place]) LEFT ISTENT INJ DIABETIC;  Surgeon: Eulogio Bear, MD;  Location: [facility];  Service: Ophthalmology;  Laterality: Left;  [place] 00:33.2 CDE 2.96 Fluid Pack Lot # [id number]   CATARACT EXTRACTION W/PHACO Right 02/24/2020   Procedure: CATARACT EXTRACTION PHACO AND INTRAOCULAR LENS PLACEMENT (IOC) RIGHT DIABETIC ISTENT INJ;  Surgeon: Eulogio Bear, MD;  Location: [facility] OR[facility];  Service: Ophthalmology;  Laterality: Right;  [place] 00:37.3 CDE 2.88 Fluid Pack Lot # [id number]   CORONARY ANGIOPLASTY WITH STENT PLACEMENT     stent placement   DIALYSIS/PERMA CATHETER INSERTION N/A 06/22/2019  Procedure: DIALYSIS/PERMA CATHETER INSERTION;  Surgeon: Algernon Huxley, MD;  Location: [place] [facility];  Service: Cardiovascular;  Laterality: N/A;   DIALYSIS/PERMA CATHETER REMOVAL N/A 05/31/2020   Procedure: DIALYSIS/PERMA CATHETER REMOVAL;  Surgeon: Katha Cabal, MD;  Location: [facility];  Service: Cardiovascular;  Laterality: N/A;   INSERTION OF AHMED VALVE Left 12/17/2019   Procedure: INSERTION OF iSTENT;  Surgeon: Eulogio Bear, MD;  Location: [facility];  Service: Ophthalmology;  Laterality: Left;     Current Medications: Current Meds  Medication Sig   albuterol (PROVENTIL HFA;VENTOLIN HFA) 108 (90 Base) MCG/ACT inhaler Inhale 2 puffs into the lungs every 4 (four) hours as needed for wheezing or shortness of breath.   ANORO ELLIPTA 62.5-25 MCG/INH AEPB INHALE 1 PUFF INTO THE LUNGS DAILY.   aspirin EC 81 MG tablet Take 81 mg by mouth in the morning. Swallow whole.   atorvastatin (LIPITOR) 40 MG tablet Take 1 tablet (40 mg total) by mouth at bedtime.   calcium acetate (PHOSLO) 667 MG capsule Take 667 mg by mouth 3 (three) times daily with meals.   Cholecalciferol (D3-1000) 25 MCG (1000 UT) tablet Take 2 tablets (2,000 Units total) by mouth daily.   cyclobenzaprine (FLEXERIL) 5 MG tablet Take 1 tablet (5 mg total) by mouth 2 (two) times daily as needed for muscle spasms.   Dulaglutide (TRULICITY) 1.5 FI/4.3PI SOPN Inject 1.5 sq every week (Patient taking differently: Inject 1.5 mg into the skin every Monday.)   furosemide (LASIX) 80 MG tablet Take 80 mg by mouth. Pt only takes when he doesn't have Dialysis days.   gabapentin (NEURONTIN) 300 MG capsule Take 300 mg by mouth daily.   glimepiride (AMARYL) 4 MG tablet Take 4 mg by mouth in the morning.   insulin degludec (TRESIBA FLEXTOUCH) 100 UNIT/ML FlexTouch Pen Inject 20 Units into the skin at bedtime.   isosorbide mononitrate (IMDUR) 60 MG 24 hr tablet Take 60 mg by mouth daily.   magnesium oxide (MAG-OX) 400 MG tablet Take 400 mg by mouth 2 (two) times daily.   Multiple Vitamins-Minerals (TAB-A-VITE) TABS TAKE 1 TABLET BY MOUTH ONCE DAILY.   omeprazole (PRILOSEC) 20 MG capsule Take 1 capsule (20 mg total) by mouth 2 (two) times daily before a meal.   Risankizumab-rzaa (SKYRIZI) 150 MG/ML SOSY Inject 150 mg into the skin as directed. Every 12 weeks for maintenance. (Patient taking differently: Inject 150 mg into the skin every 4 (four) months.)   triamcinolone (KENALOG) 0.025 % cream Apply 1 application topically daily in the afternoon.      Allergies:   Shellfish allergy, Other, Betadine [povidone iodine], and Povidone-iodine   Social History   Socioeconomic History   Marital status: Single    Spouse name: Not on file   Number of children: Not on file   Years of education: Not on file   Highest education level: Not on file  Occupational History    Comment: disabled  Tobacco Use   Smoking status: Never   Smokeless tobacco: Never  Vaping Use   Vaping Use: Never used  Substance and Sexual Activity   Alcohol use: Not Currently    Alcohol/week: 1.0 standard drink    Types: 1 Cans of beer per week    Comment: occasional   Drug use: Not Currently    Types: Marijuana    Comment: in high school    Sexual activity: Not on file  Other Topics Concern   Not on file  Social History  Narrative   Patient uses continuous oxygen at 3L/min   Dialysis, AV fistula in L arm   Lives at home by himself ; friend gives pt. Transportation    Social Determinants of Health   Financial Resource Strain: Not on file  Food Insecurity: Not on file  Transportation Needs: Not on file  Physical Activity: Not on file  Stress: Not on file  Social Connections: Not on file     Family History: The patient's family history includes Heart failure in his mother; Kidney failure in his brother.  ROS:   Please see the history of present illness.     All other systems reviewed and are negative.  EKGs/Labs/Other Studies Reviewed:    The following studies were reviewed today: Echocardiogram date 09/29/2019 1. Left ventricular ejection fraction, by visual estimation, is 45 to 50%. The left ventricle has mildly decreased function. Mildly increased left ventricular size. There is moderately increased left ventricular hypertrophy.  2. Elevated mean left atrial pressure.  3. Left ventricular diastolic Doppler parameters are consistent with pseudonormalization pattern of LV diastolic filling.  4. Global right ventricle has normal systolic  function.The right ventricular size is not well visualized. No increase in right ventricular wall thickness.  5. Left atrial size was mildly dilated.  6. Right atrial size was not well visualized.  7. The pericardium was not well visualized.  8. The mitral valve is grossly normal. Trace mitral valve regurgitation.  9. The tricuspid valve is not well visualized. Tricuspid valve regurgitation is trivial. 10. The aortic valve was not well visualized Aortic valve regurgitation was not visualized by color flow Doppler. There is no aortic valve stenosis. 11. The pulmonic valve was not well visualized. Pulmonic valve regurgitation is not visualized by color flow Doppler. 12. TR signal is inadequate for assessing pulmonary artery systolic pressure. 13. The inferior vena cava is normal in size with greater than 50% respiratory variability, suggesting right atrial pressure of 3 mmHg. 14. The interatrial septum was not well visualized.  Lexiscan myocardial perfusion imaging test 09/10/2019 Pharmacological myocardial perfusion imaging study with no significant  Ischemia Small region fixed apical defect, likely secondary to attenuation artifact though unable to exclude small region of old infarct GI uptake artifact noted Mild global hypokinesis, EF estimated at 35% (depressed EF possibly exaggerated by GI uptake artifact) No EKG changes concerning for ischemia at peak stress or in recovery. Resting EKG with nonspecific ST abnormality CT attenuation corrected images with significant three-vessel coronary calcification, mild aortic atherosclerosis Low risk scan.     EKG:  EKG is  ordered today.  The ekg ordered today demonstrates normal sinus rhythm QTC 481  Recent Labs: 05/08/2021: ALT 16 05/18/2021: BUN 85; Creatinine, Ser 6.60; Potassium 4.2; Sodium 137 05/19/2021: Hemoglobin 11.3; Magnesium 1.9; Platelets 265  Recent Lipid Panel    Component Value Date/Time   CHOL 100 05/09/2021 0453   CHOL 116  04/21/2019 1334   TRIG 117 05/09/2021 0453   HDL 24 (L) 05/09/2021 0453   HDL 37 (L) 04/21/2019 1334   CHOLHDL 4.2 05/09/2021 0453   VLDL 23 05/09/2021 0453   LDLCALC 53 05/09/2021 0453   LDLCALC 63 04/21/2019 1334    Physical Exam:    VS:  BP 120/80 (BP Location: Left Arm, Patient Position: Sitting, Cuff Size: Large)   Pulse 86   Ht 5\' 3"  (1.6 m)   Wt 252 lb 6 oz (114.5 kg)   SpO2 95%   BMI 44.71 kg/m     Wt  Readings from Last 3 Encounters:  07/20/21 252 lb 6 oz (114.5 kg)  06/08/21 248 lb 2 oz (112.5 kg)  05/30/21 250 lb (113.4 kg)     GEN:  Well nourished, well developed in no acute distress, nasal canula HEENT: Normal NECK: No JVD; No carotid bruits LYMPHATICS: No lymphadenopathy CARDIAC: RRR, no murmurs, rubs, gallops RESPIRATORY:  Clear to auscultation without rales, wheezing or rhonchi, decreased breath sounds at bases. ABDOMEN: Soft, non-tender, distended MUSCULOSKELETAL:  No edema; No deformity  SKIN: Warm and dry NEUROLOGIC:  Alert and oriented x 3 PSYCHIATRIC:  Normal affect   ASSESSMENT:     1. Coronary artery disease involving native coronary artery of native heart without angina pectoris   2. Ventricular fibrillation ([place])   3. Essential hypertension     PLAN:     Patient with history of CAD status post PCI (mLAD 2019).  Denies chest pain.  Continue aspirin, Lipitor. hx ventricular fibrillation arrest.  Plan left heart cath to evaluate ischemic source, schedule LifeVest, refer to EP for ICD consideration.  Patient agrees.  Avoiding beta-blockers due to sinus bradycardia in the hospital. History of hypertension, blood pressures, continue aspirin, statin, Imdur.  Follow-up in 6 weeks    Shared Decision Making/Informed Consent The risks [stroke (1 in 1000), death (1 in 1000), kidney failure [usually temporary] (1 in 500), bleeding (1 in 200), allergic reaction [possibly serious] (1 in 200)], benefits (diagnostic support and management of coronary  artery disease) and alternatives of a cardiac catheterization were discussed in detail with Mr. Grandville Silos and he is willing to proceed.     Medication Adjustments/Labs and Tests Ordered: Current medicines are reviewed at length with the patient today.  Concerns regarding medicines are outlined above.  Orders Placed This Encounter  Procedures   CBC   Basic metabolic panel   Ambulatory referral to Cardiac Electrophysiology   EKG 12-Lead    No orders of the defined types were placed in this encounter.   Patient Instructions  Medication Instructions:  Your physician recommends that you continue on your current medications as directed. Please refer to the Current Medication list given to you today.  *If you need a refill on your cardiac medications before your next appointment, please call your pharmacy*   Lab Work:  CBC, BMP drawn today in the lab  If you have labs (blood work) drawn today and your tests are completely normal, you will receive your results only by: [place] (if you have MyChart) OR A paper copy in the mail If you have any lab test that is abnormal or we need to change your treatment, we will call you to review the results.   Testing/Procedures:   [place], [place] [address] Dept: [phone] Loc: [place]  07/20/2021  You are scheduled for a Cardiac Catheterization on Thursday, August 11 with Dr. Kathlyn Sacramento.  1. Please arrive at the [place] at 8:30 AM (This time is one hour before your procedure to ensure your preparation). Free valet parking service is available.   Special note: Every effort is made to have your procedure done on time. Please understand that emergencies sometimes delay scheduled procedures.  2. Diet: Do not eat solid foods after midnight.  The patient may have clear liquids until 5am  upon the day of the procedure.  3. Labs: Labs drawn in office  4. Medication instructions in preparation for  your procedure:   Contrast Allergy: No   Stop taking, Lasix (Furosemide)  Thursday, August 11,  Take only 10 units of insulin the night before your procedure. Do not take any insulin on the day of the procedure.  Do not take glimepiride (AMARYL) the morning of your procedure.  On the morning of your procedure, take your Aspirin and any morning medicines NOT listed above.  You may use sips of water.  5. Plan for one night stay--bring personal belongings. 6. Bring a current list of your medications and current insurance cards. 7. You MUST have a responsible person to drive you home. 8. Someone MUST be with you the first 24 hours after you arrive home or your discharge will be delayed. 9. Please wear clothes that are easy to get on and off and wear slip-on shoes.  Thank you for allowing [place] to care for you!   -- [place] [facility]    Follow-Up: At [facility], you and your health needs are our priority.  As part of our continuing mission to provide you with exceptional heart care, we have created designated Provider Care Teams.  These Care Teams include your primary Cardiologist (physician) and Advanced Practice Providers (APPs -  Physician Assistants and Nurse Practitioners) who all work together to provide you with the care you need, when you need it.  We recommend signing up for the patient portal called "MyChart".  Sign up information is provided on this After Visit Summary.  MyChart is used to connect with patients for Virtual Visits (Telemedicine).  Patients are able to view lab/test results, encounter notes, upcoming appointments, etc.  Non-urgent messages can be sent to your provider as well.   To learn more about what you can do with MyChart, go to [email or web address].    Your next appointment:   6 week(s)  The format for your next  appointment:   In Person  Provider:    Dr. Rosiland Oz   Other Instructions    Signed, Kate Sable, MD  07/20/2021 12:45 PM    [place]

## 2021-07-20 NOTE — Telephone Encounter (Signed)
Patient seen in office today. Dr. Garen Lah ordered a Life Vest to be placed. See OV notes.   Called Dorian Pod with [employer] and Entered the life vest order as instructed per our telephone conversation. Also faxed over all requested documents as follows:  -Printout of the order -H&P -Demographic sheet -Echo report -insurance card -copy of cardiac arrest report    Fax  #  [phone]  or  [phone]   Called patient and left a VM to call back to inform him that [facility] would be reaching out today to fit him.  Called daughter to relay information so she could try and reach patient to inform him.

## 2021-07-20 NOTE — Patient Instructions (Addendum)
Medication Instructions:  Your physician recommends that you continue on your current medications as directed. Please refer to the Current Medication list given to you today.  *If you need a refill on your cardiac medications before your next appointment, please call your pharmacy*   Lab Work:  CBC, BMP drawn today in the lab  If you have labs (blood work) drawn today and your tests are completely normal, you will receive your results only by: [place] (if you have MyChart) OR A paper copy in the mail If you have any lab test that is abnormal or we need to change your treatment, we will call you to review the results.   Testing/Procedures:   [place], [place] [address] Dept: [phone] Loc: [place]  07/20/2021  You are scheduled for a Cardiac Catheterization on Thursday, August 11 with Dr. Kathlyn Sacramento.  1. Please arrive at the [facility] at 8:30 AM (This time is one hour before your procedure to ensure your preparation). Free valet parking service is available.   Special note: Every effort is made to have your procedure done on time. Please understand that emergencies sometimes delay scheduled procedures.  2. Diet: Do not eat solid foods after midnight.  The patient may have clear liquids until 5am upon the day of the procedure.  3. Labs: Labs drawn in office  4. Medication instructions in preparation for your procedure:   Contrast Allergy: No   Stop taking, Lasix (Furosemide)  Thursday, August 11,  Take only 10 units of insulin the night before your procedure. Do not take any insulin on the day of the procedure.  Do not take glimepiride (AMARYL) the morning of your procedure.  On the morning of your procedure, take your Aspirin and any morning medicines NOT listed above.  You may use sips of water.  5. Plan for one  night stay--bring personal belongings. 6. Bring a current list of your medications and current insurance cards. 7. You MUST have a responsible person to drive you home. 8. Someone MUST be with you the first 24 hours after you arrive home or your discharge will be delayed. 9. Please wear clothes that are easy to get on and off and wear slip-on shoes.  Thank you for allowing [place] to care for you!   -- [place]    Follow-Up: At [facility], you and your health needs are our priority.  As part of our continuing mission to provide you with exceptional heart care, we have created designated Provider Care Teams.  These Care Teams include your primary Cardiologist (physician) and Advanced Practice Providers (APPs -  Physician Assistants and Nurse Practitioners) who all work together to provide you with the care you need, when you need it.  We recommend signing up for the patient portal called "MyChart".  Sign up information is provided on this After Visit Summary.  MyChart is used to connect with patients for Virtual Visits (Telemedicine).  Patients are able to view lab/test results, encounter notes, upcoming appointments, etc.  Non-urgent messages can be sent to your provider as well.   To learn more about what you can do with MyChart, go to [email or web address].    Your next appointment:   6 week(s)  The format for your next appointment:   In Person  Provider:    Dr. Rosiland Oz   Other Instructions

## 2021-07-21 ENCOUNTER — Encounter: Payer: Self-pay | Admitting: Cardiology

## 2021-07-21 ENCOUNTER — Telehealth: Payer: Self-pay | Admitting: Cardiovascular Disease

## 2021-07-21 NOTE — Telephone Encounter (Signed)
Left message for patient to call back  

## 2021-07-21 NOTE — Telephone Encounter (Signed)
Patient has not gotten her labwork done as of today, and will need to reschedule her cath appointment. Patient states she needs 3 days notice for her transportation. Please call to discuss.

## 2021-07-25 NOTE — Telephone Encounter (Signed)
Spoke with patient and reviewed need for labs prior to his upcoming procedure (L heart cath). He also needs to reschedule his heart cath. He is only able to do this on odd days due to his hemodialysis. Patient is scheduled tomorrow here in our office to see Dr. Quentin Ore and confirmed that time with him. Will see if they can do his labs needed for his procedure and will coordinate rescheduling his procedure when he comes in for his visit. Patient verbalized understanding and was agreeable with plan.

## 2021-07-25 NOTE — Telephone Encounter (Signed)
Left voicemail message to please call back regarding need for labs prior to his upcoming procedure.

## 2021-07-25 NOTE — Telephone Encounter (Signed)
Patient returning call.

## 2021-07-26 ENCOUNTER — Ambulatory Visit (INDEPENDENT_AMBULATORY_CARE_PROVIDER_SITE_OTHER): Payer: Medicare Other | Admitting: Cardiology

## 2021-07-26 ENCOUNTER — Encounter: Payer: Self-pay | Admitting: Cardiology

## 2021-07-26 ENCOUNTER — Other Ambulatory Visit
Admission: RE | Admit: 2021-07-26 | Discharge: 2021-07-26 | Disposition: A | Discharge status: Home / Self Care | Payer: Medicare Other | Source: Ambulatory Visit | Attending: Cardiology | Admitting: Cardiology

## 2021-07-26 ENCOUNTER — Telehealth: Payer: Self-pay | Admitting: Cardiology

## 2021-07-26 ENCOUNTER — Other Ambulatory Visit: Payer: Self-pay

## 2021-07-26 VITALS — BP 140/90 | HR 94 | Ht 63.0 in | Wt 251.0 lb

## 2021-07-26 DIAGNOSIS — G4733 Obstructive sleep apnea (adult) (pediatric): Secondary | ICD-10-CM

## 2021-07-26 DIAGNOSIS — Z01812 Encounter for preprocedural laboratory examination: Secondary | ICD-10-CM | POA: Insufficient documentation

## 2021-07-26 DIAGNOSIS — N186 End stage renal disease: Secondary | ICD-10-CM | POA: Diagnosis present

## 2021-07-26 DIAGNOSIS — I4901 Ventricular fibrillation: Secondary | ICD-10-CM | POA: Insufficient documentation

## 2021-07-26 DIAGNOSIS — Z992 Dependence on renal dialysis: Secondary | ICD-10-CM | POA: Insufficient documentation

## 2021-07-26 DIAGNOSIS — Z01818 Encounter for other preprocedural examination: Secondary | ICD-10-CM

## 2021-07-26 DIAGNOSIS — Z9989 Dependence on other enabling machines and devices: Secondary | ICD-10-CM | POA: Insufficient documentation

## 2021-07-26 LAB — CBC
HCT: 36.4 % — ABNORMAL LOW (ref 39.0–52.0)
Hemoglobin: 11.6 g/dL — ABNORMAL LOW (ref 13.0–17.0)
MCH: 28.8 pg (ref 26.0–34.0)
MCHC: 31.9 g/dL (ref 30.0–36.0)
MCV: 90.3 fL (ref 80.0–100.0)
Platelets: 223 10*3/uL (ref 150–400)
RBC: 4.03 MIL/uL — ABNORMAL LOW (ref 4.22–5.81)
RDW: 14.8 % (ref 11.5–15.5)
WBC: 10.1 10*3/uL (ref 4.0–10.5)
nRBC: 0 % (ref 0.0–0.2)

## 2021-07-26 LAB — BASIC METABOLIC PANEL
Anion gap: 10 (ref 5–15)
BUN: 19 mg/dL (ref 6–20)
CO2: 28 mmol/L (ref 22–32)
Calcium: 8.8 mg/dL — ABNORMAL LOW (ref 8.9–10.3)
Chloride: 98 mmol/L (ref 98–111)
Creatinine, Ser: 5.61 mg/dL — ABNORMAL HIGH (ref 0.61–1.24)
GFR, Estimated: 11 mL/min — ABNORMAL LOW (ref >60)
Glucose, Bld: 147 mg/dL — ABNORMAL HIGH (ref 70–99)
Potassium: 3.8 mmol/L (ref 3.5–5.1)
Sodium: 136 mmol/L (ref 135–145)

## 2021-07-26 NOTE — Telephone Encounter (Signed)
Instructions provided to patient for his heart cath following appointment with Dr. Quentin Ore. He verbalized understanding of instructions.

## 2021-07-26 NOTE — Progress Notes (Signed)
Electrophysiology Office Follow up Visit Note:    Date:  07/26/2021   ID:  [name], DOB [birth year], MRN [id number]  PCP:  [facility] Cardiologist:  Kate Sable, MD  [facility] Electrophysiologist:  Vickie Epley, MD    Interval History:    [name] is a 55 y.o. male who presents for a follow up visit.  I first met the patient May 15, 2021 after a ventricular fibrillation arrest at the request of Dr. Saralyn Pilar.  During that hospitalization the patient suffered a ventricular fibrillation arrest and had a 12-minute downtime.  When I saw him he had been extubated.  I had a long discussion with him at the bedside and the patient was very clear that he was not interested in having a defibrillator placed.  It seems that the patient had several friends with defibrillators.  Today, he is in clinic doing much better than the last time I saw him.  He is wearing oxygen which he is worn for at least 3 years.  He tells me his oxygen level has not changed significantly over that time.  He is still fairly active and is able to grocery shop without much difficulty.  He is more interested in discussing the defibrillator during today's visit.  Past Medical History:  Diagnosis Date   Anemia of chronic disease    Bell's palsy    Cataract    Chronic combined systolic (congestive) and diastolic (congestive) heart failure (HCC)    Chronic respiratory failure with hypoxia (HCC)    3 lpm continuous   COPD (chronic obstructive pulmonary disease) (HCC)    Coronary artery disease    Diabetes mellitus without complication (HCC)    Dyspnea    Wheezing   ESRD on hemodialysis (HCC)    GERD (gastroesophageal reflux disease)    Gout    HOH (hard of hearing)    mild   Hypercholesterolemia    Hypertension    NSVT (nonsustained ventricular tachycardia) (HCC)    Obstructive sleep apnea    CPAP, O2 use continuosly at 3LPM   Orthopnea    Pneumonia    In  Past X2 most recent 1 yr ago   Psoriasis    Pulmonary HTN ([place])    Ventricular fibrillation [facility])     Past Surgical History:  Procedure Laterality Date   A/V FISTULAGRAM Right 05/02/2021   Procedure: A/V FISTULAGRAM;  Surgeon: Katha Cabal, MD;  Location: [place] [facility];  Service: Cardiovascular;  Laterality: Right;   AV FISTULA PLACEMENT Right 11/20/2019   Procedure: ARTERIOVENOUS (AV) FISTULA CREATION ( BRACHIAL CEPHALIC );  Surgeon: Katha Cabal, MD;  Location: [facility] ORS;  Service: Vascular;  Laterality: Right;   CATARACT EXTRACTION W/PHACO Left 12/17/2019   Procedure: CATARACT EXTRACTION PHACO AND INTRAOCULAR LENS PLACEMENT ([place]) LEFT ISTENT INJ DIABETIC;  Surgeon: Eulogio Bear, MD;  Location: [facility] ORS;  Service: Ophthalmology;  Laterality: Left;  [place] 00:33.2 CDE 2.96 Fluid Pack Lot # [id number]   CATARACT EXTRACTION W/PHACO Right 02/24/2020   Procedure: CATARACT EXTRACTION PHACO AND INTRAOCULAR LENS PLACEMENT (IOC) RIGHT DIABETIC ISTENT INJ;  Surgeon: Eulogio Bear, MD;  Location: [facility] ORS;  Service: Ophthalmology;  Laterality: Right;  [place] 00:37.3 CDE 2.88 Fluid Pack Lot # [id number]   CORONARY ANGIOPLASTY WITH STENT PLACEMENT     stent placement   DIALYSIS/PERMA CATHETER INSERTION N/A 06/22/2019   Procedure: DIALYSIS/PERMA CATHETER INSERTION;  Surgeon: Algernon Huxley, MD;  Location: [facility]  INVASIVE CV LAB;  Service: Cardiovascular;  Laterality: N/A;   DIALYSIS/PERMA CATHETER REMOVAL N/A 05/31/2020   Procedure: DIALYSIS/PERMA CATHETER REMOVAL;  Surgeon: Katha Cabal, MD;  Location: [place] CV LAB;  Service: Cardiovascular;  Laterality: N/A;   INSERTION OF AHMED VALVE Left 12/17/2019   Procedure: INSERTION OF iSTENT;  Surgeon: Eulogio Bear, MD;  Location: [facility] ORS;  Service: Ophthalmology;  Laterality: Left;    Current Medications: Current Meds  Medication Sig   albuterol (PROVENTIL HFA;VENTOLIN HFA) 108 (90 Base) MCG/ACT inhaler Inhale 2 puffs into  the lungs every 4 (four) hours as needed for wheezing or shortness of breath.   allopurinol (ZYLOPRIM) 100 MG tablet Take 200 mg by mouth daily.   ANORO ELLIPTA 62.5-25 MCG/INH AEPB INHALE 1 PUFF INTO THE LUNGS DAILY.   aspirin EC 81 MG tablet Take 81 mg by mouth in the morning. Swallow whole.   atorvastatin (LIPITOR) 40 MG tablet Take 1 tablet (40 mg total) by mouth at bedtime.   calcium acetate (PHOSLO) 667 MG capsule Take 667 mg by mouth 3 (three) times daily with meals.   Cholecalciferol (D3-1000) 25 MCG (1000 UT) tablet Take 2 tablets (2,000 Units total) by mouth daily.   cyclobenzaprine (FLEXERIL) 5 MG tablet Take 1 tablet (5 mg total) by mouth 2 (two) times daily as needed for muscle spasms.   furosemide (LASIX) 80 MG tablet Take 80 mg by mouth. Pt only takes when he doesn't have Dialysis days.   gabapentin (NEURONTIN) 300 MG capsule Take 300 mg by mouth daily.   glimepiride (AMARYL) 4 MG tablet Take 4 mg by mouth in the morning.   insulin degludec (TRESIBA FLEXTOUCH) 100 UNIT/ML FlexTouch Pen Inject 20 Units into the skin at bedtime.   isosorbide mononitrate (IMDUR) 60 MG 24 hr tablet Take 60 mg by mouth daily.   magnesium oxide (MAG-OX) 400 MG tablet Take 400 mg by mouth 2 (two) times daily.   metoprolol succinate (TOPROL-XL) 25 MG 24 hr tablet Take 25 mg by mouth daily.   Multiple Vitamins-Minerals (TAB-A-VITE) TABS TAKE 1 TABLET BY MOUTH ONCE DAILY.   omeprazole (PRILOSEC) 20 MG capsule Take 1 capsule (20 mg total) by mouth 2 (two) times daily before a meal.   Risankizumab-rzaa (SKYRIZI) 150 MG/ML SOSY Inject 150 mg into the skin as directed. Every 12 weeks for maintenance. (Patient taking differently: Inject 150 mg into the skin every 4 (four) months.)   triamcinolone (KENALOG) 0.025 % cream Apply 1 application topically daily in the afternoon.   TRULICITY 3 WL/7.9GX SOPN Inject into the skin.     Allergies:   Shellfish allergy, Other, Betadine [povidone iodine], and Povidone-iodine    Social History   Socioeconomic History   Marital status: Single    Spouse name: Not on file   Number of children: Not on file   Years of education: Not on file   Highest education level: Not on file  Occupational History    Comment: disabled  Tobacco Use   Smoking status: Never   Smokeless tobacco: Never  Vaping Use   Vaping Use: Never used  Substance and Sexual Activity   Alcohol use: Not Currently    Alcohol/week: 1.0 standard drink    Types: 1 Cans of beer per week    Comment: occasional   Drug use: Not Currently    Types: Marijuana    Comment: in high school    Sexual activity: Not on file  Other Topics Concern   Not on file  Social History Narrative   Patient uses continuous oxygen at 3L/min   Dialysis, AV fistula in L arm   Lives at home by himself ; friend gives pt. Transportation    Social Determinants of Health   Financial Resource Strain: Not on file  Food Insecurity: Not on file  Transportation Needs: Not on file  Physical Activity: Not on file  Stress: Not on file  Social Connections: Not on file     Family History: The patient's family history includes Heart failure in his mother; Kidney failure in his brother.  ROS:   Please see the history of present illness.    All other systems reviewed and are negative.  EKGs/Labs/Other Studies Reviewed:    The following studies were reviewed today:  Prior hospitalization records  May 11, 2021 echo personally reviewed Left ventricular function normal, 50 to 55% Right ventricular function normal No significant valvular abnormalities  EKG:  The ekg ordered today demonstrates sinus rhythm.  Left axis.  Recent Labs: 05/08/2021: ALT 16 05/18/2021: BUN 85; Creatinine, Ser 6.60; Potassium 4.2; Sodium 137 05/19/2021: Magnesium 1.9 07/26/2021: Hemoglobin 11.6; Platelets 223  Recent Lipid Panel    Component Value Date/Time   CHOL 100 05/09/2021 0453   CHOL 116 04/21/2019 1334   TRIG 117 05/09/2021 0453    HDL 24 (L) 05/09/2021 0453   HDL 37 (L) 04/21/2019 1334   CHOLHDL 4.2 05/09/2021 0453   VLDL 23 05/09/2021 0453   LDLCALC 53 05/09/2021 0453   LDLCALC 63 04/21/2019 1334    Physical Exam:    VS:  BP 140/90 (BP Location: Left Arm, Patient Position: Sitting, Cuff Size: Large)   Pulse 94   Ht _0  (1.6 m)   Wt 251 lb (113.9 kg)   SpO2 95% Comment: 3 Liters of Oxygen  BMI 44.46 kg/m     Wt Readings from Last 3 Encounters:  07/26/21 251 lb (113.9 kg)  07/20/21 252 lb 6 oz (114.5 kg)  06/08/21 248 lb 2 oz (112.5 kg)     GEN:  Well nourished, well developed in no acute distress.  Obese. HEENT: Normal NECK: No JVD; No carotid bruits LYMPHATICS: No lymphadenopathy CARDIAC: RRR, no murmurs, rubs, gallops RESPIRATORY:  Clear to auscultation without rales, wheezing or rhonchi  ABDOMEN: Soft, non-tender, non-distended MUSCULOSKELETAL:  No edema; No deformity  SKIN: Warm and dry.  Psoriasis lesions spread over a large majority of his skin surface.  His left mid axillary area is free of any open sores.  His subxiphoid area is also free of open sores. NEUROLOGIC:  Alert and oriented x 3 PSYCHIATRIC:  Normal affect   ASSESSMENT:    1. Ventricular fibrillation ([place])   2. ESRD on hemodialysis ([facility])   3. OSA on CPAP   4. Pre-procedure lab exam    PLAN:    In order of problems listed above:   1. Ventricular fibrillation [facility]) Patient with a VF arrest.  His potassium is elevated at the time.  Given his end-stage renal disease I still think he is at an elevated risk moving forward of having a recurrent episode of ventricular tachycardia or ventricular fibrillation.  I do think he would benefit long-term given his young age and history of an arrest by having a defibrillator.  Given his end-stage renal disease, I do not think he is a good candidate for traditional transvenous device.  We discussed a subcutaneous ICD during today's visit and he is interested in pursuing this.  I would like  him  to have his left heart catheterization before we proceed with implant.  If he has significant coronary disease, would plan to treat that and avoid ICD at least at first.  If he does not have significant coronary disease, would plan to proceed with subcutaneous ICD implant.  I discussed the risks, recovery of this procedure and he is interested in scheduling.  The patient has a history of a ventricular tachycardia/fibrillation arrest.  His ejection fraction is mildly reduced to 50%.  He has NYHA class III symptoms.  He is referred by Dr Saralyn Pilar for risk stratification of sudden death and consideration of ICD implantation.  At this time, he meets criteria for ICD implantation for secondary prevention of sudden death.  I have had a thorough discussion with the patient reviewing options.  The patient and their family (if available) have had opportunities to ask questions and have them answered. The patient and I have decided together through a shared decision making process to proceed with S ICD implant pending the results of his left heart catheterization.   Risks, benefits, alternatives to ICD implantation were discussed in detail with the patient today. The patient understands that the risks include but are not limited to bleeding, infection, pneumothorax, perforation, tamponade, vascular damage, renal failure, MI, stroke, death, inappropriate shocks, and lead dislodgement and wishes to proceed.  We will therefore schedule device implantation at the next available time.   2. ESRD on hemodialysis ([place])  3. OSA on CPAP  4. Pre-procedure lab exam     I will plan to follow-up with the patient via a virtual appointment after his left heart catheterization to review the results and their impact on the plan for defibrillator implant.     Total time spent with patient today 45 minutes. This includes reviewing records, evaluating the patient and coordinating care.   Medication Adjustments/Labs and  Tests Ordered: Current medicines are reviewed at length with the patient today.  Concerns regarding medicines are outlined above.  Orders Placed This Encounter  Procedures   Basic metabolic panel   CBC   EKG 12-Lead    No orders of the defined types were placed in this encounter.    Signed, Lars Mage, MD, [facility], [facility] 07/26/2021 1:04 PM    Electrophysiology [facility]

## 2021-07-26 NOTE — Patient Instructions (Addendum)
Medication Instructions:  Your physician recommends that you continue on your current medications as directed. Please refer to the Current Medication list given to you today.  *If you need a refill on your cardiac medications before your next appointment, please call your pharmacy*   Lab Work: CBC and BMET today If you have labs (blood work) drawn today and your tests are completely normal, you will receive your results only by: [facility] (if you have MyChart) OR A paper copy in the mail If you have any lab test that is abnormal or we need to change your treatment, we will call you to review the results.   Testing/Procedures: Your physician has recommended that you have a defibrillator inserted. An implantable cardioverter defibrillator (ICD) is a small device that is placed in your chest or, in rare cases, your abdomen. This device uses electrical pulses or shocks to help control life-threatening, irregular heartbeats that could lead the heart to suddenly stop beating (sudden cardiac arrest). Leads are attached to the ICD that goes into your heart. This is done in the hospital and usually requires an overnight stay. Please see the instruction sheet given to you today for more information.    Follow-Up: At [facility], you and your health needs are our priority.  As part of our continuing mission to provide you with exceptional heart care, we have created designated Provider Care Teams.  These Care Teams include your primary Cardiologist (physician) and Advanced Practice Providers (APPs -  Physician Assistants and Nurse Practitioners) who all work together to provide you with the care you need, when you need it.  We recommend signing up for the patient portal called "MyChart".  Sign up information is provided on this After Visit Summary.  MyChart is used to connect with patients for Virtual Visits (Telemedicine).  Patients are able to view lab/test results, encounter notes, upcoming  appointments, etc.  Non-urgent messages can be sent to your provider as well.   To learn more about what you can do with MyChart, go to [email or web address].    Your next appointment:   Virtual Visit 08/16/2021 at 1140am with Dr Quentin Ore   Other Instructions ICD implant to be schedule      [facility] Cardiac Cath Instructions  You are scheduled for a Cardiac Cath on: Tuesday August 23 Please arrive at 06:30 am on the day of your procedure Please expect a call from our [facility] to pre-register you Do not eat/drink anything after midnight Someone will need to drive you home It is recommended someone be with you for the first 24 hours after your procedure Wear clothes that are easy to get on/off and wear slip on shoes if possible   Medications bring a current list of all medications with you  _XX__ The night before only take 10 units of your [place]. HOLD all diabetic medications the morning of your procedure. Also HOLD Glimepiride (Amaryl) and Furosemide that morning.    _XX__You may take all of your other medications the morning of your procedure with enough water to swallow safely    Day of your procedure: Arrive at the [facility] entrance.  Free valet service is available.  After entering the [facility] please check-in at the registration desk (1st desk on your right) to receive your armband. After receiving your armband someone will escort you to the cardiac cath/special procedures waiting area.  The usual length of stay after your procedure is about 2 to 3 hours.  This can  vary.  If you have any questions, please call our office at [phone], or you may call the cardiac cath lab at [facility] directly at [phone]

## 2021-07-26 NOTE — Telephone Encounter (Signed)
  Patient Consent for Virtual Visit        [name] has provided verbal consent on 07/26/2021 for a virtual visit (video or telephone).   CONSENT FOR VIRTUAL VISIT FOR:  [name]  By participating in this virtual visit I agree to the following:  I hereby voluntarily request, consent and authorize [facility] and its employed or contracted physicians, physician assistants, nurse practitioners or other licensed health care professionals (the Practitioner), to provide me with telemedicine health care services (the "Services") as deemed necessary by the treating Practitioner. I acknowledge and consent to receive the Services by the Practitioner via telemedicine. I understand that the telemedicine visit will involve communicating with the Practitioner through live audiovisual communication technology and the disclosure of certain medical information by electronic transmission. I acknowledge that I have been given the opportunity to request an in-person assessment or other available alternative prior to the telemedicine visit and am voluntarily participating in the telemedicine visit.  I understand that I have the right to withhold or withdraw my consent to the use of telemedicine in the course of my care at any time, without affecting my right to future care or treatment, and that the Practitioner or I may terminate the telemedicine visit at any time. I understand that I have the right to inspect all information obtained and/or recorded in the course of the telemedicine visit and may receive copies of available information for a reasonable fee.  I understand that some of the potential risks of receiving the Services via telemedicine include:  Delay or interruption in medical evaluation due to technological equipment failure or disruption; Information transmitted may not be sufficient (e.g. poor resolution of images) to allow for appropriate medical decision making by the  Practitioner; and/or  In rare instances, security protocols could fail, causing a breach of personal health information.  Furthermore, I acknowledge that it is my responsibility to provide information about my medical history, conditions and care that is complete and accurate to the best of my ability. I acknowledge that Practitioner's advice, recommendations, and/or decision may be based on factors not within their control, such as incomplete or inaccurate data provided by me or distortions of diagnostic images or specimens that may result from electronic transmissions. I understand that the practice of medicine is not an exact science and that Practitioner makes no warranties or guarantees regarding treatment outcomes. I acknowledge that a copy of this consent can be made available to me via my patient portal ([facility]), or I can request a printed copy by calling the office of [facility].    I understand that my insurance will be billed for this visit.   I have read or had this consent read to me. I understand the contents of this consent, which adequately explains the benefits and risks of the Services being provided via telemedicine.  I have been provided ample opportunity to ask questions regarding this consent and the Services and have had my questions answered to my satisfaction. I give my informed consent for the services to be provided through the use of telemedicine in my medical care

## 2021-07-26 NOTE — H&P (View-Only) (Signed)
Electrophysiology Office Follow up Visit Note:    Date:  07/26/2021   ID:  [name], DOB [birth year], MRN [id number]  PCP:  [facility] Cardiologist:  Kate Sable, MD  [facility] Electrophysiologist:  Vickie Epley, MD    Interval History:    [name] is a 55 y.o. male who presents for a follow up visit.  I first met the patient May 15, 2021 after a ventricular fibrillation arrest at the request of Dr. Saralyn Pilar.  During that hospitalization the patient suffered a ventricular fibrillation arrest and had a 12-minute downtime.  When I saw him he had been extubated.  I had a long discussion with him at the bedside and the patient was very clear that he was not interested in having a defibrillator placed.  It seems that the patient had several friends with defibrillators.  Today, he is in clinic doing much better than the last time I saw him.  He is wearing oxygen which he is worn for at least 3 years.  He tells me his oxygen level has not changed significantly over that time.  He is still fairly active and is able to grocery shop without much difficulty.  He is more interested in discussing the defibrillator during today's visit.  Past Medical History:  Diagnosis Date   Anemia of chronic disease    Bell's palsy    Cataract    Chronic combined systolic (congestive) and diastolic (congestive) heart failure (HCC)    Chronic respiratory failure with hypoxia (HCC)    3 lpm continuous   COPD (chronic obstructive pulmonary disease) (HCC)    Coronary artery disease    Diabetes mellitus without complication (HCC)    Dyspnea    Wheezing   ESRD on hemodialysis (HCC)    GERD (gastroesophageal reflux disease)    Gout    HOH (hard of hearing)    mild   Hypercholesterolemia    Hypertension    NSVT (nonsustained ventricular tachycardia) (HCC)    Obstructive sleep apnea    CPAP, O2 use continuosly at 3LPM   Orthopnea    Pneumonia    In  Past X2 most recent 1 yr ago   Psoriasis    Pulmonary HTN ([place])    Ventricular fibrillation [facility])     Past Surgical History:  Procedure Laterality Date   A/V FISTULAGRAM Right 05/02/2021   Procedure: A/V FISTULAGRAM;  Surgeon: Katha Cabal, MD;  Location: [place] [facility];  Service: Cardiovascular;  Laterality: Right;   AV FISTULA PLACEMENT Right 11/20/2019   Procedure: ARTERIOVENOUS (AV) FISTULA CREATION ( BRACHIAL CEPHALIC );  Surgeon: Katha Cabal, MD;  Location: [facility] ORS;  Service: Vascular;  Laterality: Right;   CATARACT EXTRACTION W/PHACO Left 12/17/2019   Procedure: CATARACT EXTRACTION PHACO AND INTRAOCULAR LENS PLACEMENT ([place]) LEFT ISTENT INJ DIABETIC;  Surgeon: Eulogio Bear, MD;  Location: [facility] ORS;  Service: Ophthalmology;  Laterality: Left;  [place] 00:33.2 CDE 2.96 Fluid Pack Lot # [id number]   CATARACT EXTRACTION W/PHACO Right 02/24/2020   Procedure: CATARACT EXTRACTION PHACO AND INTRAOCULAR LENS PLACEMENT (IOC) RIGHT DIABETIC ISTENT INJ;  Surgeon: Eulogio Bear, MD;  Location: [facility] ORS;  Service: Ophthalmology;  Laterality: Right;  [place] 00:37.3 CDE 2.88 Fluid Pack Lot # [id number]   CORONARY ANGIOPLASTY WITH STENT PLACEMENT     stent placement   DIALYSIS/PERMA CATHETER INSERTION N/A 06/22/2019   Procedure: DIALYSIS/PERMA CATHETER INSERTION;  Surgeon: Algernon Huxley, MD;  Location: [facility]  INVASIVE CV LAB;  Service: Cardiovascular;  Laterality: N/A;   DIALYSIS/PERMA CATHETER REMOVAL N/A 05/31/2020   Procedure: DIALYSIS/PERMA CATHETER REMOVAL;  Surgeon: Katha Cabal, MD;  Location: [place] [facility];  Service: Cardiovascular;  Laterality: N/A;   INSERTION OF AHMED VALVE Left 12/17/2019   Procedure: INSERTION OF iSTENT;  Surgeon: Eulogio Bear, MD;  Location: [facility] ORS;  Service: Ophthalmology;  Laterality: Left;    Current Medications: Current Meds  Medication Sig   albuterol (PROVENTIL HFA;VENTOLIN HFA) 108 (90 Base) MCG/ACT inhaler Inhale 2 puffs into  the lungs every 4 (four) hours as needed for wheezing or shortness of breath.   allopurinol (ZYLOPRIM) 100 MG tablet Take 200 mg by mouth daily.   ANORO ELLIPTA 62.5-25 MCG/INH AEPB INHALE 1 PUFF INTO THE LUNGS DAILY.   aspirin EC 81 MG tablet Take 81 mg by mouth in the morning. Swallow whole.   atorvastatin (LIPITOR) 40 MG tablet Take 1 tablet (40 mg total) by mouth at bedtime.   calcium acetate (PHOSLO) 667 MG capsule Take 667 mg by mouth 3 (three) times daily with meals.   Cholecalciferol (D3-1000) 25 MCG (1000 UT) tablet Take 2 tablets (2,000 Units total) by mouth daily.   cyclobenzaprine (FLEXERIL) 5 MG tablet Take 1 tablet (5 mg total) by mouth 2 (two) times daily as needed for muscle spasms.   furosemide (LASIX) 80 MG tablet Take 80 mg by mouth. Pt only takes when he doesn't have Dialysis days.   gabapentin (NEURONTIN) 300 MG capsule Take 300 mg by mouth daily.   glimepiride (AMARYL) 4 MG tablet Take 4 mg by mouth in the morning.   insulin degludec (TRESIBA FLEXTOUCH) 100 UNIT/ML FlexTouch Pen Inject 20 Units into the skin at bedtime.   isosorbide mononitrate (IMDUR) 60 MG 24 hr tablet Take 60 mg by mouth daily.   magnesium oxide (MAG-OX) 400 MG tablet Take 400 mg by mouth 2 (two) times daily.   metoprolol succinate (TOPROL-XL) 25 MG 24 hr tablet Take 25 mg by mouth daily.   Multiple Vitamins-Minerals (TAB-A-VITE) TABS TAKE 1 TABLET BY MOUTH ONCE DAILY.   omeprazole (PRILOSEC) 20 MG capsule Take 1 capsule (20 mg total) by mouth 2 (two) times daily before a meal.   Risankizumab-rzaa (SKYRIZI) 150 MG/ML SOSY Inject 150 mg into the skin as directed. Every 12 weeks for maintenance. (Patient taking differently: Inject 150 mg into the skin every 4 (four) months.)   triamcinolone (KENALOG) 0.025 % cream Apply 1 application topically daily in the afternoon.   TRULICITY 3 WL/7.9GX SOPN Inject into the skin.     Allergies:   Shellfish allergy, Other, Betadine [povidone iodine], and Povidone-iodine    Social History   Socioeconomic History   Marital status: Single    Spouse name: Not on file   Number of children: Not on file   Years of education: Not on file   Highest education level: Not on file  Occupational History    Comment: disabled  Tobacco Use   Smoking status: Never   Smokeless tobacco: Never  Vaping Use   Vaping Use: Never used  Substance and Sexual Activity   Alcohol use: Not Currently    Alcohol/week: 1.0 standard drink    Types: 1 Cans of beer per week    Comment: occasional   Drug use: Not Currently    Types: Marijuana    Comment: in high school    Sexual activity: Not on file  Other Topics Concern   Not on file  Social History Narrative   Patient uses continuous oxygen at 3L/min   Dialysis, AV fistula in L arm   Lives at home by himself ; friend gives pt. Transportation    Social Determinants of Health   Financial Resource Strain: Not on file  Food Insecurity: Not on file  Transportation Needs: Not on file  Physical Activity: Not on file  Stress: Not on file  Social Connections: Not on file     Family History: The patient's family history includes Heart failure in his mother; Kidney failure in his brother.  ROS:   Please see the history of present illness.    All other systems reviewed and are negative.  EKGs/Labs/Other Studies Reviewed:    The following studies were reviewed today:  Prior hospitalization records  May 11, 2021 echo personally reviewed Left ventricular function normal, 50 to 55% Right ventricular function normal No significant valvular abnormalities  EKG:  The ekg ordered today demonstrates sinus rhythm.  Left axis.  Recent Labs: 05/08/2021: ALT 16 05/18/2021: BUN 85; Creatinine, Ser 6.60; Potassium 4.2; Sodium 137 05/19/2021: Magnesium 1.9 07/26/2021: Hemoglobin 11.6; Platelets 223  Recent Lipid Panel    Component Value Date/Time   CHOL 100 05/09/2021 0453   CHOL 116 04/21/2019 1334   TRIG 117 05/09/2021 0453    HDL 24 (L) 05/09/2021 0453   HDL 37 (L) 04/21/2019 1334   CHOLHDL 4.2 05/09/2021 0453   VLDL 23 05/09/2021 0453   LDLCALC 53 05/09/2021 0453   LDLCALC 63 04/21/2019 1334    Physical Exam:    VS:  BP 140/90 (BP Location: Left Arm, Patient Position: Sitting, Cuff Size: Large)   Pulse 94   Ht _0  (1.6 m)   Wt 251 lb (113.9 kg)   SpO2 95% Comment: 3 Liters of Oxygen  BMI 44.46 kg/m     Wt Readings from Last 3 Encounters:  07/26/21 251 lb (113.9 kg)  07/20/21 252 lb 6 oz (114.5 kg)  06/08/21 248 lb 2 oz (112.5 kg)     GEN:  Well nourished, well developed in no acute distress.  Obese. HEENT: Normal NECK: No JVD; No carotid bruits LYMPHATICS: No lymphadenopathy CARDIAC: RRR, no murmurs, rubs, gallops RESPIRATORY:  Clear to auscultation without rales, wheezing or rhonchi  ABDOMEN: Soft, non-tender, non-distended MUSCULOSKELETAL:  No edema; No deformity  SKIN: Warm and dry.  Psoriasis lesions spread over a large majority of his skin surface.  His left mid axillary area is free of any open sores.  His subxiphoid area is also free of open sores. NEUROLOGIC:  Alert and oriented x 3 PSYCHIATRIC:  Normal affect   ASSESSMENT:    1. Ventricular fibrillation ([place])   2. ESRD on hemodialysis ([facility])   3. OSA on CPAP   4. Pre-procedure lab exam    PLAN:    In order of problems listed above:   1. Ventricular fibrillation [facility]) Patient with a VF arrest.  His potassium is elevated at the time.  Given his end-stage renal disease I still think he is at an elevated risk moving forward of having a recurrent episode of ventricular tachycardia or ventricular fibrillation.  I do think he would benefit long-term given his young age and history of an arrest by having a defibrillator.  Given his end-stage renal disease, I do not think he is a good candidate for traditional transvenous device.  We discussed a subcutaneous ICD during today's visit and he is interested in pursuing this.  I would like  him  to have his left heart catheterization before we proceed with implant.  If he has significant coronary disease, would plan to treat that and avoid ICD at least at first.  If he does not have significant coronary disease, would plan to proceed with subcutaneous ICD implant.  I discussed the risks, recovery of this procedure and he is interested in scheduling.  The patient has a history of a ventricular tachycardia/fibrillation arrest.  His ejection fraction is mildly reduced to 50%.  He has NYHA class III symptoms.  He is referred by Dr Saralyn Pilar for risk stratification of sudden death and consideration of ICD implantation.  At this time, he meets criteria for ICD implantation for secondary prevention of sudden death.  I have had a thorough discussion with the patient reviewing options.  The patient and their family (if available) have had opportunities to ask questions and have them answered. The patient and I have decided together through a shared decision making process to proceed with S ICD implant pending the results of his left heart catheterization.   Risks, benefits, alternatives to ICD implantation were discussed in detail with the patient today. The patient understands that the risks include but are not limited to bleeding, infection, pneumothorax, perforation, tamponade, vascular damage, renal failure, MI, stroke, death, inappropriate shocks, and lead dislodgement and wishes to proceed.  We will therefore schedule device implantation at the next available time.   2. ESRD on hemodialysis ([place])  3. OSA on CPAP  4. Pre-procedure lab exam     I will plan to follow-up with the patient via a virtual appointment after his left heart catheterization to review the results and their impact on the plan for defibrillator implant.     Total time spent with patient today 45 minutes. This includes reviewing records, evaluating the patient and coordinating care.   Medication Adjustments/Labs and  Tests Ordered: Current medicines are reviewed at length with the patient today.  Concerns regarding medicines are outlined above.  Orders Placed This Encounter  Procedures   Basic metabolic panel   CBC   EKG 12-Lead    No orders of the defined types were placed in this encounter.    Signed, Lars Mage, MD, [facility], [facility] 07/26/2021 1:04 PM    Electrophysiology [facility]

## 2021-07-27 DIAGNOSIS — I25110 Atherosclerotic heart disease of native coronary artery with unstable angina pectoris: Secondary | ICD-10-CM

## 2021-08-08 ENCOUNTER — Encounter
Admission: RE | Disposition: A | Discharge status: Home / Self Care | Payer: Self-pay | Source: Home / Self Care | Attending: Internal Medicine

## 2021-08-08 ENCOUNTER — Ambulatory Visit
Admission: RE | Admit: 2021-08-08 | Discharge: 2021-08-08 | Disposition: A | Discharge status: Home / Self Care | Payer: Medicare Other | Attending: Internal Medicine | Admitting: Internal Medicine

## 2021-08-08 ENCOUNTER — Other Ambulatory Visit: Payer: Self-pay

## 2021-08-08 ENCOUNTER — Encounter: Payer: Self-pay | Admitting: Internal Medicine

## 2021-08-08 DIAGNOSIS — Z992 Dependence on renal dialysis: Secondary | ICD-10-CM | POA: Diagnosis not present

## 2021-08-08 DIAGNOSIS — Z7982 Long term (current) use of aspirin: Secondary | ICD-10-CM | POA: Insufficient documentation

## 2021-08-08 DIAGNOSIS — I132 Hypertensive heart and chronic kidney disease with heart failure and with stage 5 chronic kidney disease, or end stage renal disease: Secondary | ICD-10-CM | POA: Diagnosis not present

## 2021-08-08 DIAGNOSIS — E1122 Type 2 diabetes mellitus with diabetic chronic kidney disease: Secondary | ICD-10-CM | POA: Insufficient documentation

## 2021-08-08 DIAGNOSIS — Z888 Allergy status to other drugs, medicaments and biological substances status: Secondary | ICD-10-CM | POA: Insufficient documentation

## 2021-08-08 DIAGNOSIS — Z79899 Other long term (current) drug therapy: Secondary | ICD-10-CM | POA: Insufficient documentation

## 2021-08-08 DIAGNOSIS — I2510 Atherosclerotic heart disease of native coronary artery without angina pectoris: Secondary | ICD-10-CM

## 2021-08-08 DIAGNOSIS — Z8249 Family history of ischemic heart disease and other diseases of the circulatory system: Secondary | ICD-10-CM | POA: Insufficient documentation

## 2021-08-08 DIAGNOSIS — Z6841 Body Mass Index (BMI) 40.0 and over, adult: Secondary | ICD-10-CM | POA: Diagnosis not present

## 2021-08-08 DIAGNOSIS — I5042 Chronic combined systolic (congestive) and diastolic (congestive) heart failure: Secondary | ICD-10-CM | POA: Diagnosis not present

## 2021-08-08 DIAGNOSIS — I25110 Atherosclerotic heart disease of native coronary artery with unstable angina pectoris: Secondary | ICD-10-CM | POA: Insufficient documentation

## 2021-08-08 DIAGNOSIS — Z794 Long term (current) use of insulin: Secondary | ICD-10-CM | POA: Diagnosis not present

## 2021-08-08 DIAGNOSIS — Z7984 Long term (current) use of oral hypoglycemic drugs: Secondary | ICD-10-CM | POA: Diagnosis not present

## 2021-08-08 DIAGNOSIS — D631 Anemia in chronic kidney disease: Secondary | ICD-10-CM | POA: Diagnosis not present

## 2021-08-08 DIAGNOSIS — G4733 Obstructive sleep apnea (adult) (pediatric): Secondary | ICD-10-CM | POA: Diagnosis not present

## 2021-08-08 DIAGNOSIS — Z91013 Allergy to seafood: Secondary | ICD-10-CM | POA: Insufficient documentation

## 2021-08-08 DIAGNOSIS — Z955 Presence of coronary angioplasty implant and graft: Secondary | ICD-10-CM | POA: Insufficient documentation

## 2021-08-08 DIAGNOSIS — Z9981 Dependence on supplemental oxygen: Secondary | ICD-10-CM | POA: Diagnosis not present

## 2021-08-08 DIAGNOSIS — I4901 Ventricular fibrillation: Secondary | ICD-10-CM | POA: Insufficient documentation

## 2021-08-08 DIAGNOSIS — N186 End stage renal disease: Secondary | ICD-10-CM | POA: Insufficient documentation

## 2021-08-08 HISTORY — PX: LEFT HEART CATH AND CORONARY ANGIOGRAPHY: CATH118249

## 2021-08-08 LAB — GLUCOSE, CAPILLARY
Glucose-Capillary: 81 mg/dL (ref 70–99)
Glucose-Capillary: 64 mg/dL — ABNORMAL LOW (ref 70–99)
Glucose-Capillary: 100 mg/dL — ABNORMAL HIGH (ref 70–99)
Glucose-Capillary: 82 mg/dL (ref 70–99)

## 2021-08-08 SURGERY — LEFT HEART CATH AND CORONARY ANGIOGRAPHY
Anesthesia: Moderate Sedation

## 2021-08-08 MED ORDER — FENTANYL CITRATE (PF) 100 MCG/2ML IJ SOLN
INTRAMUSCULAR | Status: DC | PRN
  Administered 2021-08-08 (×2): 25 ug via INTRAVENOUS

## 2021-08-08 MED ORDER — HYDRALAZINE HCL 20 MG/ML IJ SOLN: 10.0000 mg | INTRAMUSCULAR | Status: DC | PRN

## 2021-08-08 MED ORDER — LABETALOL HCL 5 MG/ML IV SOLN: 10.0000 mg | INTRAVENOUS | Status: DC | PRN

## 2021-08-08 MED ORDER — SODIUM CHLORIDE 0.9 % IV SOLN: 250.0000 mL | INTRAVENOUS | Status: DC | PRN

## 2021-08-08 MED ORDER — SODIUM CHLORIDE 0.9% FLUSH: 3.0000 mL | Freq: Two times a day (BID) | INTRAVENOUS | Status: DC

## 2021-08-08 MED ORDER — SODIUM CHLORIDE 0.9% FLUSH: 3.0000 mL | INTRAVENOUS | Status: DC | PRN

## 2021-08-08 MED ORDER — MIDAZOLAM HCL 2 MG/2ML IJ SOLN
INTRAMUSCULAR | Status: AC
  Filled 2021-08-08: qty 2

## 2021-08-08 MED ORDER — MIDAZOLAM HCL 2 MG/2ML IJ SOLN
INTRAMUSCULAR | Status: DC | PRN
  Administered 2021-08-08 (×2): 1 mg via INTRAVENOUS

## 2021-08-08 MED ORDER — ONDANSETRON HCL 4 MG/2ML IJ SOLN: 4.0000 mg | Freq: Four times a day (QID) | INTRAMUSCULAR | Status: DC | PRN

## 2021-08-08 MED ORDER — IOHEXOL 350 MG/ML SOLN
INTRAVENOUS | Status: DC | PRN
  Administered 2021-08-08: 46 mL

## 2021-08-08 MED ORDER — LIDOCAINE HCL 1 % IJ SOLN
INTRAMUSCULAR | Status: AC
  Filled 2021-08-08: qty 20

## 2021-08-08 MED ORDER — DEXTROSE 50 % IV SOLN
INTRAVENOUS | Status: AC
  Administered 2021-08-08: 25 mL via INTRAVENOUS
  Filled 2021-08-08: qty 50

## 2021-08-08 MED ORDER — HEPARIN (PORCINE) IN NACL 2000-0.9 UNIT/L-% IV SOLN
INTRAVENOUS | Status: DC | PRN
  Administered 2021-08-08: 1000 mL

## 2021-08-08 MED ORDER — ACETAMINOPHEN 325 MG PO TABS: 650.0000 mg | ORAL_TABLET | ORAL | Status: DC | PRN

## 2021-08-08 MED ORDER — SODIUM CHLORIDE 0.9 % IV SOLN: INTRAVENOUS | Status: DC

## 2021-08-08 MED ORDER — DEXTROSE 50 % IV SOLN: 25.0000 mL | Freq: Once | INTRAVENOUS | Status: AC

## 2021-08-08 MED ORDER — LIDOCAINE HCL (PF) 1 % IJ SOLN
INTRAMUSCULAR | Status: DC | PRN
  Administered 2021-08-08: 30 mL

## 2021-08-08 MED ORDER — FENTANYL CITRATE (PF) 100 MCG/2ML IJ SOLN
INTRAMUSCULAR | Status: AC
  Filled 2021-08-08: qty 2

## 2021-08-08 MED ORDER — HEPARIN (PORCINE) IN NACL 1000-0.9 UT/500ML-% IV SOLN
INTRAVENOUS | Status: AC
  Filled 2021-08-08: qty 1000

## 2021-08-08 SURGICAL SUPPLY — 13 items
CANNULA 5F STIFF (CANNULA) IMPLANT
CATH INFINITI 5FR JL4 (CATHETERS) ×2 IMPLANT
CATH INFINITI JR4 5F (CATHETERS) ×2 IMPLANT
DEVICE CLOSURE MYNXGRIP 5F (Vascular Products) ×2 IMPLANT
KIT MICROPUNCTURE NIT STIFF (SHEATH) ×2 IMPLANT
PACK CARDIAC CATH (CUSTOM PROCEDURE TRAY) ×2 IMPLANT
PAD ELECT DEFIB RADIOL ZOLL (MISCELLANEOUS) ×2 IMPLANT
PANNUS RETENTION SYSTEM 2 PAD (MISCELLANEOUS) ×2 IMPLANT
PROTECTION STATION PRESSURIZED (MISCELLANEOUS) ×2
SET ATX SIMPLICITY (MISCELLANEOUS) ×2 IMPLANT
SHEATH AVANTI 5FR X 11CM (SHEATH) ×2 IMPLANT
STATION PROTECTION PRESSURIZED (MISCELLANEOUS) ×1 IMPLANT
WIRE GUIDERIGHT .035X150 (WIRE) ×2 IMPLANT

## 2021-08-08 NOTE — Interval H&P Note (Signed)
History and Physical Interval Note:  08/08/2021 12:33 PM  [name]  has presented today for surgery, with the diagnosis of ventricular fibrillation.  The various methods of treatment have been discussed with the patient and family. After consideration of risks, benefits and other options for treatment, the patient has consented to  Procedure(s): LEFT HEART CATH AND CORONARY ANGIOGRAPHY (N/A) as a surgical intervention.  The patient's history has been reviewed, patient examined, no change in status, stable for surgery.  I have reviewed the patient's chart and labs.  Questions were answered to the patient's satisfaction.    Cath Lab Visit (complete for each Cath Lab visit)  Clinical Evaluation Leading to the Procedure:   ACS: No.  Non-ACS:    Anginal Classification: CCS II  Anti-ischemic medical therapy: Minimal Therapy (1 class of medications)  Non-Invasive Test Results: Low-risk stress test findings: cardiac mortality <1%/year  Prior CABG: No previous CABG  Debrah Granderson

## 2021-08-10 ENCOUNTER — Telehealth: Payer: Self-pay | Admitting: Internal Medicine

## 2021-08-10 NOTE — Telephone Encounter (Signed)
-----   Message from Nelva Bush, MD sent at 08/10/2021  1:28 PM EDT ----- Regarding: Post-cath follow-up Hello,  Could you try to schedule Mr. Devaux to follow-up with me in the office sometime next week to discuss PCI to his LAD and LCx?  If it works for him, I could try to see him sometime Tuesday afternoon, as I believe he normally dialyzes on MWF.  Thanks.  Gerald Stabs

## 2021-08-10 NOTE — Telephone Encounter (Signed)
Attempted to schedule.  LMOV to call office.  ° °

## 2021-08-11 ENCOUNTER — Other Ambulatory Visit: Payer: Self-pay | Admitting: Adult Health

## 2021-08-11 NOTE — Telephone Encounter (Signed)
Attempted to schedule.  LMOV to call office.  ° °

## 2021-08-11 NOTE — Telephone Encounter (Signed)
Scheduled with patient 8/30 End at 230

## 2021-08-15 ENCOUNTER — Other Ambulatory Visit: Payer: Self-pay

## 2021-08-15 ENCOUNTER — Encounter: Payer: Self-pay | Admitting: Internal Medicine

## 2021-08-15 ENCOUNTER — Ambulatory Visit (INDEPENDENT_AMBULATORY_CARE_PROVIDER_SITE_OTHER): Payer: Medicare Other | Admitting: Physician Assistant

## 2021-08-15 VITALS — BP 112/78 | HR 93 | Ht 63.0 in | Wt 253.5 lb

## 2021-08-15 DIAGNOSIS — I25118 Atherosclerotic heart disease of native coronary artery with other forms of angina pectoris: Secondary | ICD-10-CM | POA: Diagnosis not present

## 2021-08-15 DIAGNOSIS — I5042 Chronic combined systolic (congestive) and diastolic (congestive) heart failure: Secondary | ICD-10-CM | POA: Diagnosis not present

## 2021-08-15 DIAGNOSIS — Z01818 Encounter for other preprocedural examination: Secondary | ICD-10-CM

## 2021-08-15 DIAGNOSIS — I4901 Ventricular fibrillation: Secondary | ICD-10-CM

## 2021-08-15 DIAGNOSIS — I255 Ischemic cardiomyopathy: Secondary | ICD-10-CM | POA: Diagnosis not present

## 2021-08-15 DIAGNOSIS — N186 End stage renal disease: Secondary | ICD-10-CM

## 2021-08-15 DIAGNOSIS — Z01812 Encounter for preprocedural laboratory examination: Secondary | ICD-10-CM

## 2021-08-15 DIAGNOSIS — E785 Hyperlipidemia, unspecified: Secondary | ICD-10-CM

## 2021-08-15 DIAGNOSIS — G4733 Obstructive sleep apnea (adult) (pediatric): Secondary | ICD-10-CM

## 2021-08-15 DIAGNOSIS — Z992 Dependence on renal dialysis: Secondary | ICD-10-CM

## 2021-08-15 DIAGNOSIS — I10 Essential (primary) hypertension: Secondary | ICD-10-CM

## 2021-08-15 DIAGNOSIS — Z9989 Dependence on other enabling machines and devices: Secondary | ICD-10-CM

## 2021-08-15 NOTE — H&P (View-Only) (Signed)
Cardiology Office Note    Date:  08/15/2021   ID:  [name], DOB 12/02/1966, MRN [id number]  PCP:  [facility]  Cardiologist:  Kate Sable, MD  Electrophysiologist:  Vickie Epley, MD   Chief Complaint: LHC follow-up  History of Present Illness:   [name] is a 55 y.o. male with history of CAD with NSTEMI status post PCI/BMS to the RCA in 05/2015 at [facility], chronic combined systolic and diastolic CHF, ICM, chronic hypoxic respiratory failure ESRD on HD, DM2 with diabetic nephropathy, HTN, anemia of chronic disease, morbid obesity with OHS, and OSA on CPAP who presents for [facility] follow up.   2D echo performed at [facility] in 04/2018 showed an EF of 35%, mildly enlarged LV internal cavity size, mild LVH, hypokinesis of the posterior and inferior myocardium, no significant valvular abnormalities, and a trivial pericardial effusion.  Nuclear stress test at [facility] in 04/2018 showed an area of mild decreased activity in the anterior segment stress images which improved with rest and favored to be attenuation artifact, though cannot completely exclude mild single-vessel reversible ischemia with a dilated LV at stress and rest with global hypokinesis with an EF calculated at 39%.  CT attenuation corrected images showed coronary artery calcification.   He established care with Dr. Garen Lah in 08/2019 with Lexiscan MPI at that time showing no significant ischemia with a small region of fixed apical defect felt to likely be secondary to attenuation artifact though unable to exclude a small region of old infarct, mild global hypokinesis with an EF estimated at 35%.  CT attenuation corrected images showed significant three-vessel coronary artery calcification and mild aortic atherosclerosis.  Overall, this was a low risk scan.  Echo in 09/2019 showed an EF of 45 to 50%, mildly increased LV cavity size, moderate LVH, diastolic dysfunction, normal RV systolic function, mildly dilated  left atrium, trace mitral regurgitation, trivial tricuspid regurgitation.   He was to [facility] from 5/23 through 6/3 for acute on chronic hypoxic respiratory failure requiring mechanical ventilation and severe encephalopathy complicated by VF arrest occurring with dialysis which was treated per ACLS protocol, with 12 minutes of CPR.  High-sensitivity troponin peaked at 208.  He was consulted on by outside cardiology group on 05/10/2021.  Echo showed an EF of 50-55%, no RWMA, mild LVH, normal LV diastolic function parameters, normal RV systolic function and ventricular cavity size, and no significant valvular abnormalities. LHC was recommended when patient was more alert. He was evaluated by Dr. Quentin Ore, for consideration of ICD implantation, with the patient being very much against implantation of an ICD. His VF arrest was possibly felt to be in the setting of underlying ESRD with electrolyte abnormalities including hyperkalemia with a potassium of 7.2.  LHC and LifeVest were recommended prior to discharge by EP.  Note indicates the patient was not interested in [facility].  He was not discharged with a LifeVest.    LifeVest was subsequently applied in early 07/2021 after reconsidering.  He was evaluate by EP later in 07/2021, with recommendation for subcutaneous ICD, pending LHC results.  He underwent LHC on 08/08/2021 that showed proximal LAD calcified and eccentric 75% stenosis, D1 70% stenosis, mid LCx 90% stenosis, proximal RCA 25% stenosis, mid to distal RCA 10% ISR.  Recommendation was made to defer PCI at that time with plans for complex PCI at [facility].  He comes in today to discuss this.  Since he was last seen, he does report having had an episode  of substernal chest pressure that woke him from sleep on 826 and was associated with an episode of emesis and abdominal discomfort.  Symptoms persisted throughout most of the day and resolved when he drank some ginger ale.  He has been asymptomatic since.  No  exertional symptoms.  No shocks, alarms, or pounds from his LifeVest.  He does report his Lipitor was discontinued several months back, though he does not recall the specifics of this.  He is currently without chest pain.    Labs independently reviewed: 07/2021 - potassium 3.8, BUN 19, serum creatinine 5.61, Hgb 11.6, PLT 223 06/2021 - TSH normal 05/2021 - magnesium 1.9, TC 100, TG 117, HDL 24, LDL 53, A1c 7.1, albumin 4.3, AST/ALT not elevated  Past Medical History:  Diagnosis Date   Anemia of chronic disease    Bell's palsy    Cataract    Chronic combined systolic (congestive) and diastolic (congestive) heart failure (HCC)    Chronic respiratory failure with hypoxia (HCC)    3 lpm continuous   COPD (chronic obstructive pulmonary disease) (HCC)    Coronary artery disease    Diabetes mellitus without complication (HCC)    Dyspnea    Wheezing   ESRD on hemodialysis (HCC)    GERD (gastroesophageal reflux disease)    Gout    HOH (hard of hearing)    mild   Hypercholesterolemia    Hypertension    NSVT (nonsustained ventricular tachycardia) (HCC)    Obstructive sleep apnea    CPAP, O2 use continuosly at 3LPM   Orthopnea    Pneumonia    In Past X2 most recent 1 yr ago   Psoriasis    Pulmonary HTN ([place])    Ventricular fibrillation [facility])     Past Surgical History:  Procedure Laterality Date   A/V FISTULAGRAM Right 05/02/2021   Procedure: A/V FISTULAGRAM;  Surgeon: Katha Cabal, MD;  Location: [facility];  Service: Cardiovascular;  Laterality: Right;   AV FISTULA PLACEMENT Right 11/20/2019   Procedure: ARTERIOVENOUS (AV) FISTULA CREATION ( BRACHIAL CEPHALIC );  Surgeon: Katha Cabal, MD;  Location: [facility] ORS;  Service: Vascular;  Laterality: Right;   CATARACT EXTRACTION W/PHACO Left 12/17/2019   Procedure: CATARACT EXTRACTION PHACO AND INTRAOCULAR LENS PLACEMENT ([place]) LEFT ISTENT INJ DIABETIC;  Surgeon: Eulogio Bear, MD;  Location: [facility] ORS;  Service:  Ophthalmology;  Laterality: Left;  [place] 00:33.2 CDE 2.96 Fluid Pack Lot # [id number]   CATARACT EXTRACTION W/PHACO Right 02/24/2020   Procedure: CATARACT EXTRACTION PHACO AND INTRAOCULAR LENS PLACEMENT (I[facility]) RIGHT DIABETIC ISTENT INJ;  Surgeon: Eulogio Bear, MD;  Location: [facility] OR[facility];  Service: Ophthalmology;  Laterality: Right;  [place] 00:37.3 CDE 2.88 Fluid Pack Lot # [id number]   CORONARY ANGIOPLASTY WITH STENT PLACEMENT     stent placement   DIALYSIS/PERMA CATHETER INSERTION N/A 06/22/2019   Procedure: DIALYSIS/PERMA CATHETER INSERTION;  Surgeon: Algernon Huxley, MD;  Location: [facility];  Service: Cardiovascular;  Laterality: N/A;   DIALYSIS/PERMA CATHETER REMOVAL N/A 05/31/2020   Procedure: DIALYSIS/PERMA CATHETER REMOVAL;  Surgeon: Katha Cabal, MD;  Location: [place] [facility];  Service: Cardiovascular;  Laterality: N/A;   INSERTION OF AHMED VALVE Left 12/17/2019   Procedure: INSERTION OF iSTENT;  Surgeon: Eulogio Bear, MD;  Location: [facility] ORS;  Service: Ophthalmology;  Laterality: Left;   LEFT HEART CATH AND CORONARY ANGIOGRAPHY N/A 08/08/2021   Procedure: LEFT HEART CATH AND CORONARY ANGIOGRAPHY;  Surgeon: Nelva Bush, MD;  Location:  [place] [facility];  Service: Cardiovascular;  Laterality: N/A;    Current Medications: Current Meds  Medication Sig   albuterol (PROVENTIL HFA;VENTOLIN HFA) 108 (90 Base) MCG/ACT inhaler Inhale 2 puffs into the lungs every 4 (four) hours as needed for wheezing or shortness of breath.   allopurinol (ZYLOPRIM) 100 MG tablet Take 100 mg by mouth daily.   ANORO ELLIPTA 62.5-25 MCG/INH AEPB INHALE 1 PUFF INTO THE LUNGS DAILY.   aspirin EC 81 MG tablet Take 81 mg by mouth in the morning. Swallow whole.   calcium acetate (PHOSLO) 667 MG capsule Take 667 mg by mouth 3 (three) times daily with meals.   cyclobenzaprine (FLEXERIL) 5 MG tablet Take 1 tablet (5 mg total) by mouth 2 (two) times daily as needed for muscle spasms.   furosemide  (LASIX) 80 MG tablet Take 80 mg by mouth See admin instructions. Pt only takes when he doesn't have Dialysis days. Tues and Thurs Sat and Sun   glimepiride (AMARYL) 4 MG tablet Take 4 mg by mouth daily with breakfast.   insulin degludec (TRESIBA FLEXTOUCH) 100 UNIT/ML FlexTouch Pen Inject 20 Units into the skin at bedtime.   metoprolol succinate (TOPROL-XL) 25 MG 24 hr tablet Take 25 mg by mouth daily.   OXYGEN Inhale 3 L into the lungs daily.   triamcinolone (KENALOG) 0.025 % cream Apply 1 application topically daily in the afternoon.   TRULICITY 3 OI/7.1IW SOPN Inject 3 mg into the skin every Thursday.    Allergies:   Shellfish allergy, Betadine [povidone iodine], and Povidone-iodine   Social History   Socioeconomic History   Marital status: Single    Spouse name: Not on file   Number of children: Not on file   Years of education: Not on file   Highest education level: Not on file  Occupational History    Comment: disabled  Tobacco Use   Smoking status: Never   Smokeless tobacco: Never  Vaping Use   Vaping Use: Never used  Substance and Sexual Activity   Alcohol use: Not Currently    Alcohol/week: 1.0 standard drink    Types: 1 Cans of beer per week    Comment: occasional   Drug use: Not Currently    Types: Marijuana    Comment: in high school    Sexual activity: Not on file  Other Topics Concern   Not on file  Social History Narrative   Patient uses continuous oxygen at 3L/min   Dialysis, AV fistula in L arm   Lives at home by himself ; friend gives pt. Transportation    Social Determinants of Health   Financial Resource Strain: Not on file  Food Insecurity: Not on file  Transportation Needs: Not on file  Physical Activity: Not on file  Stress: Not on file  Social Connections: Not on file     Family History:  The patient's family history includes Heart failure in his mother; Kidney failure in his brother.  ROS:   Review of Systems  Constitutional:  Positive  for malaise/fatigue. Negative for chills, diaphoresis, fever and weight loss.  HENT:  Negative for congestion.   Eyes:  Negative for discharge and redness.  Respiratory:  Negative for cough, sputum production, shortness of breath and wheezing.   Cardiovascular:  Positive for chest pain. Negative for palpitations, orthopnea, claudication, leg swelling and PND.  Gastrointestinal:  Positive for abdominal pain, heartburn, nausea and vomiting. Negative for melena.  Musculoskeletal:  Negative for falls and myalgias.  Skin:  Negative for rash.  Neurological:  Negative for dizziness, tingling, tremors, sensory change, speech change, focal weakness, loss of consciousness and weakness.  Endo/Heme/Allergies:  Does not bruise/bleed easily.  Psychiatric/Behavioral:  Negative for substance abuse. The patient is not nervous/anxious.     EKGs/Labs/Other Studies Reviewed:    Studies reviewed were summarized above. The additional studies were reviewed today:  LHC 08/08/2021: Conclusions: Severe two-vessel coronary artery disease including calcified 70-80% proximal LAD disease, 70% ostial D1 stenosis, and 90% lesion involving small-moderate caliber AV groove LCx following takeoff of OM1. Patent mid/distal RCA stent with minimal in-stent restenosis. Moderately elevated left ventricular filling pressure (LVEDP ~30 mmHg).   Recommendations: Review coronary imaging with heart team and primary cardiologist.  Given patient's diabetes mellitus and severe two-vessel coronary artery disease involving the proximal LAD, CABG and PCI would be reasonable options.  Given calcification of the proximal LAD, would favor any attempted intervention be performed where atherectomy and lithotripsy are immediately available. Aggressive secondary prevention of coronary artery disease. Recommend continued use of LifeVest pending revascularization and EP follow-up. Recommend escalation of fluid removal with hemodialysis given  moderately elevated left heart filling pressure. __________  2D echo 05/10/2021: 1. Left ventricular ejection fraction, by estimation, is 50 to 55%. The  left ventricle has low normal function. The left ventricle has no regional  wall motion abnormalities. There is mild left ventricular hypertrophy.  Left ventricular diastolic  parameters were normal.   2. Right ventricular systolic function is normal. The right ventricular  size is normal.   3. The mitral valve is normal in structure. No evidence of mitral valve  regurgitation.   4. The aortic valve is grossly normal. Aortic valve regurgitation is not  visualized.  __________   2D echo 01/29/2021: 1. Left ventricular ejection fraction, by estimation, is 55 to 60%. The  left ventricle has normal function. The left ventricle has no regional  wall motion abnormalities. There is moderate left ventricular hypertrophy.  Left ventricular diastolic  parameters are consistent with Grade II diastolic dysfunction  (pseudonormalization). Elevated left ventricular end-diastolic pressure.   2. Right ventricular systolic function is normal. The right ventricular  size is normal.   3. The mitral valve is normal in structure. No evidence of mitral valve  regurgitation. No evidence of mitral stenosis.   4. The aortic valve was not well visualized. Aortic valve regurgitation  is not visualized. Mild to moderate aortic valve sclerosis/calcification  is present, without any evidence of aortic stenosis.   5. The inferior vena cava is normal in size with greater than 50%  respiratory variability, suggesting right atrial pressure of 3 mmHg. __________   2D echo 09/29/2019: 1. Left ventricular ejection fraction, by visual estimation, is 45 to  50%. The left ventricle has mildly decreased function. Mildly increased  left ventricular size. There is moderately increased left ventricular  hypertrophy.   2. Elevated mean left atrial pressure.   3. Left  ventricular diastolic Doppler parameters are consistent with  pseudonormalization pattern of LV diastolic filling.   4. Global right ventricle has normal systolic function.The right  ventricular size is not well visualized. No increase in right ventricular  wall thickness.   5. Left atrial size was mildly dilated.   6. Right atrial size was not well visualized.   7. The pericardium was not well visualized.   8. The mitral valve is grossly normal. Trace mitral valve regurgitation.   9. The tricuspid valve is not well visualized. Tricuspid valve  regurgitation is  trivial.  10. The aortic valve was not well visualized Aortic valve regurgitation  was not visualized by color flow Doppler. There is no aortic valve  stenosis.  11. The pulmonic valve was not well visualized. Pulmonic valve  regurgitation is not visualized by color flow Doppler.  12. TR signal is inadequate for assessing pulmonary artery systolic  pressure.  13. The inferior vena cava is normal in size with greater than 50%  respiratory variability, suggesting right atrial pressure of 3 mmHg.  14. The interatrial septum was not well visualized.  __________   Carlton Adam MPI 08/2019: Pharmacological myocardial perfusion imaging study with no significant  Ischemia Small region fixed apical defect, likely secondary to attenuation artifact though unable to exclude small region of old infarct GI uptake artifact noted Mild global hypokinesis, EF estimated at 35% (depressed EF possibly exaggerated by GI uptake artifact) No EKG changes concerning for ischemia at peak stress or in recovery. Resting EKG with nonspecific ST abnormality CT attenuation corrected images with significant three-vessel coronary calcification, mild aortic atherosclerosis Low risk scan.   __________   2D echo 06/16/2019: 1. The left ventricle has low normal systolic function, with an ejection  fraction of 50-55%. The cavity size was normal. There is mildly  increased  left ventricular wall thickness. Left ventricular diastolic parameters  were normal.   2. The right ventricle has normal systolic function. The cavity was  normal. There is no increase in right ventricular wall thickness.   3. The aortic valve is tricuspid. Aortic valve regurgitation was not  assessed by color flow Doppler.     EKG:  EKG is ordered today.  The EKG ordered today demonstrates NSR, 93 bpm, rare PAC, poor R wave progression along the precordial leads, nonspecific ST-T changes  Recent Labs: 05/08/2021: ALT 16 05/19/2021: Magnesium 1.9 07/26/2021: BUN 19; Creatinine, Ser 5.61; Hemoglobin 11.6; Platelets 223; Potassium 3.8; Sodium 136  Recent Lipid Panel    Component Value Date/Time   CHOL 100 05/09/2021 0453   CHOL 116 04/21/2019 1334   TRIG 117 05/09/2021 0453   HDL 24 (L) 05/09/2021 0453   HDL 37 (L) 04/21/2019 1334   CHOLHDL 4.2 05/09/2021 0453   VLDL 23 05/09/2021 0453   LDLCALC 53 05/09/2021 0453   LDLCALC 63 04/21/2019 1334    PHYSICAL EXAM:    VS:  BP 112/78 (BP Location: Left Arm, Patient Position: Sitting, Cuff Size: Large)   Pulse 93   Ht 5\' 3"  (1.6 m)   Wt 253 lb 8 oz (115 kg)   SpO2 98% Comment: 98 % 3 Liters  BMI 44.91 kg/m   BMI: Body mass index is 44.91 kg/m.  Physical Exam Vitals reviewed.  Constitutional:      Appearance: He is well-developed.  HENT:     Head: Normocephalic and atraumatic.  Eyes:     General:        Right eye: No discharge.        Left eye: No discharge.  Neck:     Vascular: No JVD.  Cardiovascular:     Rate and Rhythm: Normal rate and regular rhythm.     Heart sounds: Normal heart sounds, S1 normal and S2 normal. Heart sounds not distant. No midsystolic click and no opening snap. No murmur heard.   No friction rub.  Pulmonary:     Effort: Pulmonary effort is normal. No respiratory distress.     Breath sounds: Normal breath sounds. No decreased breath sounds, wheezing or rales.  Chest:  Chest wall: No  tenderness.  Abdominal:     General: There is no distension.     Palpations: Abdomen is soft.     Tenderness: There is no abdominal tenderness.  Musculoskeletal:     Cervical back: Normal range of motion.     Right lower leg: No edema.     Left lower leg: No edema.  Skin:    General: Skin is warm and dry.     Nails: There is no clubbing.  Neurological:     Mental Status: He is alert and oriented to person, place, and time.  Psychiatric:        Speech: Speech normal.        Behavior: Behavior normal.        Thought Content: Thought content normal.        Judgment: Judgment normal.    Wt Readings from Last 3 Encounters:  08/15/21 253 lb 8 oz (115 kg)  08/08/21 251 lb 1.7 oz (113.9 kg)  07/26/21 251 lb (113.9 kg)     ASSESSMENT & PLAN:   CAD involving the native coronary arteries with stable angina and history of VF arrest: Currently without angina.  Given the above LHC, plan is to proceed with complex PCI at [facility].  Patient is a dialysis patient and refuses hemodialysis at [facility].  Given this, we will plan for LHC on 9/12.  Given complexity of case he will not be a same-day discharge.  Patient indicates he will undergo hemodialysis on 9/9 and 9/10.  Following his discharge on 9/13 he will return back to his outpatient dialysis center for his regularly scheduled dialysis on 9/14.  Continue aspirin and metoprolol.  Resume atorvastatin.  He declined precath labs today.  Risks and benefits of cardiac catheterization have been discussed with the patient including risks of bleeding, bruising, infection, kidney damage, stroke, heart attack, urgent need for bypass, injury to limb, and death. The patient understands these risks and is willing to proceed with the procedure. All questions have been answered and concerns listened to.   Chronic combined systolic and diastolic CHF secondary to ICM: Volume status is difficult to assess on physical exam secondary to body habitus.  Volume  management is largely maintained through hemodialysis.  He remains on metoprolol daily and furosemide on 9 hemodialysis days.  Relative hypotension, in the context of hemodialysis, precludes escalation of GDMT at this time.  If tolerated, consider adding BiDil  History of VF arrest: Wearing a LifeVest without any alarms, cons, or shocks.  Plan for percutaneous revascularization as outlined above.  Follow-up with EP as directed.  ESRD: He undergoes hemodialysis on Monday, Wednesday, and Fridays.  As noted above, he is adamant he does not want to undergo hemodialysis while at [facility] in [place].  He indicates he plans to undergo hemodialysis leading up to his cath, which is scheduled for 9/12, on 9/9 and 9/10 followed by dialysis on his regularly scheduled Wednesday, 9/14.  When asked who may need to contact to authorize this change he states he can just let the nurse know.  I will forward this note to his nephrologist for their update and review as well.  Should an urgent need arises at [facility], hemodialysis may be needed while he is admitted.  HTN: Blood pressure is well controlled in the office today.  Continue Toprol-XL 25 mg daily and Lasix 80 mg on non-hemodialysis days.  HLD: LDL 53 in 05/2021.  He indicates he has not been  on atorvastatin, stating someone at previously discontinued this medication.  I cannot find out why or when this medication was discontinued.  We did resume atorvastatin 40 mg daily at today's visit.  Goal LDL remains less than 70.  Morbid obesity with OSA and OHS: Weight loss is encouraged through heart healthy diet and regular exercise.  Continued adherence to CPAP recommended.  Chronic hypoxic respiratory failure: Stable.  Follow-up with PCP as directed.  Disposition: F/u with Dr. Garen Lah or an APP in 1-2 weeks post LHC, and EP as directed.   Medication Adjustments/Labs and Tests Ordered: Current medicines are reviewed at length with the patient today.   Concerns regarding medicines are outlined above. Medication changes, Labs and Tests ordered today are summarized above and listed in the Patient Instructions accessible in Encounters.   Signed, Christell Faith, PA-C 08/15/2021 5:24 PM     [facility] [address] [place] [address] [place], [place] [address] [phone]

## 2021-08-15 NOTE — Progress Notes (Signed)
Cardiology Office Note    Date:  08/15/2021   ID:  [name], DOB [birth year], MRN [id number]  PCP:  [facility]  Cardiologist:  Kate Sable, MD  Electrophysiologist:  Vickie Epley, MD   Chief Complaint: LHC follow-up  History of Present Illness:   [name] is a 55 y.o. male with history of CAD with NSTEMI status post PCI/BMS to the RCA in 05/2015 at [facility], chronic combined systolic and diastolic CHF, ICM, chronic hypoxic respiratory failure ESRD on HD, DM2 with diabetic nephropathy, HTN, anemia of chronic disease, morbid obesity with OHS, and OSA on CPAP who presents for [facility] follow up.   2D echo performed at [facility] in 04/2018 showed an EF of 35%, mildly enlarged LV internal cavity size, mild LVH, hypokinesis of the posterior and inferior myocardium, no significant valvular abnormalities, and a trivial pericardial effusion.  Nuclear stress test at [facility] in 04/2018 showed an area of mild decreased activity in the anterior segment stress images which improved with rest and favored to be attenuation artifact, though cannot completely exclude mild single-vessel reversible ischemia with a dilated LV at stress and rest with global hypokinesis with an EF calculated at 39%.  CT attenuation corrected images showed coronary artery calcification.   He established care with Dr. Garen Lah in 08/2019 with Lexiscan MPI at that time showing no significant ischemia with a small region of fixed apical defect felt to likely be secondary to attenuation artifact though unable to exclude a small region of old infarct, mild global hypokinesis with an EF estimated at 35%.  CT attenuation corrected images showed significant three-vessel coronary artery calcification and mild aortic atherosclerosis.  Overall, this was a low risk scan.  Echo in 09/2019 showed an EF of 45 to 50%, mildly increased LV cavity size, moderate LVH, diastolic dysfunction, normal RV systolic function, mildly dilated  left atrium, trace mitral regurgitation, trivial tricuspid regurgitation.   He was to [facility] from 5/23 through 6/3 for acute on chronic hypoxic respiratory failure requiring mechanical ventilation and severe encephalopathy complicated by VF arrest occurring with dialysis which was treated per ACLS protocol, with 12 minutes of CPR.  High-sensitivity troponin peaked at 208.  He was consulted on by outside cardiology group on 05/10/2021.  Echo showed an EF of 50-55%, no RWMA, mild LVH, normal LV diastolic function parameters, normal RV systolic function and ventricular cavity size, and no significant valvular abnormalities. LHC was recommended when patient was more alert. He was evaluated by Dr. Quentin Ore, for consideration of ICD implantation, with the patient being very much against implantation of an ICD. His VF arrest was possibly felt to be in the setting of underlying ESRD with electrolyte abnormalities including hyperkalemia with a potassium of 7.2.  LHC and LifeVest were recommended prior to discharge by EP.  Note indicates the patient was not interested in [facility].  He was not discharged with a LifeVest.    LifeVest was subsequently applied in early 07/2021 after reconsidering.  He was evaluate by EP later in 07/2021, with recommendation for subcutaneous ICD, pending LHC results.  He underwent LHC on 08/08/2021 that showed proximal LAD calcified and eccentric 75% stenosis, D1 70% stenosis, mid LCx 90% stenosis, proximal RCA 25% stenosis, mid to distal RCA 10% ISR.  Recommendation was made to defer PCI at that time with plans for complex PCI at [facility].  He comes in today to discuss this.  Since he was last seen, he does report having had an episode  of substernal chest pressure that woke him from sleep on 826 and was associated with an episode of emesis and abdominal discomfort.  Symptoms persisted throughout most of the day and resolved when he drank some ginger ale.  He has been asymptomatic since.  No  exertional symptoms.  No shocks, alarms, or pounds from his LifeVest.  He does report his Lipitor was discontinued several months back, though he does not recall the specifics of this.  He is currently without chest pain.    Labs independently reviewed: 07/2021 - potassium 3.8, BUN 19, serum creatinine 5.61, Hgb 11.6, PLT 223 06/2021 - TSH normal 05/2021 - magnesium 1.9, TC 100, TG 117, HDL 24, LDL 53, A1c 7.1, albumin 4.3, AST/ALT not elevated  Past Medical History:  Diagnosis Date   Anemia of chronic disease    Bell's palsy    Cataract    Chronic combined systolic (congestive) and diastolic (congestive) heart failure (HCC)    Chronic respiratory failure with hypoxia (HCC)    3 lpm continuous   COPD (chronic obstructive pulmonary disease) (HCC)    Coronary artery disease    Diabetes mellitus without complication (HCC)    Dyspnea    Wheezing   ESRD on hemodialysis (HCC)    GERD (gastroesophageal reflux disease)    Gout    HOH (hard of hearing)    mild   Hypercholesterolemia    Hypertension    NSVT (nonsustained ventricular tachycardia) (HCC)    Obstructive sleep apnea    CPAP, O2 use continuosly at 3LPM   Orthopnea    Pneumonia    In Past X2 most recent 1 yr ago   Psoriasis    Pulmonary HTN ([place])    Ventricular fibrillation [facility])     Past Surgical History:  Procedure Laterality Date   A/V FISTULAGRAM Right 05/02/2021   Procedure: A/V FISTULAGRAM;  Surgeon: Katha Cabal, MD;  Location: [place] [facility];  Service: Cardiovascular;  Laterality: Right;   AV FISTULA PLACEMENT Right 11/20/2019   Procedure: ARTERIOVENOUS (AV) FISTULA CREATION ( BRACHIAL CEPHALIC );  Surgeon: Katha Cabal, MD;  Location: [facility] ORS;  Service: Vascular;  Laterality: Right;   CATARACT EXTRACTION W/PHACO Left 12/17/2019   Procedure: CATARACT EXTRACTION PHACO AND INTRAOCULAR LENS PLACEMENT ([place]) LEFT ISTENT INJ DIABETIC;  Surgeon: Eulogio Bear, MD;  Location: [facility] ORS;  Service:  Ophthalmology;  Laterality: Left;  [place] 00:33.2 CDE 2.96 Fluid Pack Lot # [id number]   CATARACT EXTRACTION W/PHACO Right 02/24/2020   Procedure: CATARACT EXTRACTION PHACO AND INTRAOCULAR LENS PLACEMENT (IOC) RIGHT DIABETIC ISTENT INJ;  Surgeon: Eulogio Bear, MD;  Location: [facility] ORS;  Service: Ophthalmology;  Laterality: Right;  [place] 00:37.3 CDE 2.88 Fluid Pack Lot # [id number]   CORONARY ANGIOPLASTY WITH STENT PLACEMENT     stent placement   DIALYSIS/PERMA CATHETER INSERTION N/A 06/22/2019   Procedure: DIALYSIS/PERMA CATHETER INSERTION;  Surgeon: Algernon Huxley, MD;  Location: [place] [facility];  Service: Cardiovascular;  Laterality: N/A;   DIALYSIS/PERMA CATHETER REMOVAL N/A 05/31/2020   Procedure: DIALYSIS/PERMA CATHETER REMOVAL;  Surgeon: Katha Cabal, MD;  Location: [place] [facility];  Service: Cardiovascular;  Laterality: N/A;   INSERTION OF AHMED VALVE Left 12/17/2019   Procedure: INSERTION OF iSTENT;  Surgeon: Eulogio Bear, MD;  Location: [facility] ORS;  Service: Ophthalmology;  Laterality: Left;   LEFT HEART CATH AND CORONARY ANGIOGRAPHY N/A 08/08/2021   Procedure: LEFT HEART CATH AND CORONARY ANGIOGRAPHY;  Surgeon: Nelva Bush, MD;  Location:  [place] [facility];  Service: Cardiovascular;  Laterality: N/A;    Current Medications: Current Meds  Medication Sig   albuterol (PROVENTIL HFA;VENTOLIN HFA) 108 (90 Base) MCG/ACT inhaler Inhale 2 puffs into the lungs every 4 (four) hours as needed for wheezing or shortness of breath.   allopurinol (ZYLOPRIM) 100 MG tablet Take 100 mg by mouth daily.   ANORO ELLIPTA 62.5-25 MCG/INH AEPB INHALE 1 PUFF INTO THE LUNGS DAILY.   aspirin EC 81 MG tablet Take 81 mg by mouth in the morning. Swallow whole.   calcium acetate (PHOSLO) 667 MG capsule Take 667 mg by mouth 3 (three) times daily with meals.   cyclobenzaprine (FLEXERIL) 5 MG tablet Take 1 tablet (5 mg total) by mouth 2 (two) times daily as needed for muscle spasms.   furosemide  (LASIX) 80 MG tablet Take 80 mg by mouth See admin instructions. Pt only takes when he doesn't have Dialysis days. Tues and Thurs Sat and Sun   glimepiride (AMARYL) 4 MG tablet Take 4 mg by mouth daily with breakfast.   insulin degludec (TRESIBA FLEXTOUCH) 100 UNIT/ML FlexTouch Pen Inject 20 Units into the skin at bedtime.   metoprolol succinate (TOPROL-XL) 25 MG 24 hr tablet Take 25 mg by mouth daily.   OXYGEN Inhale 3 L into the lungs daily.   triamcinolone (KENALOG) 0.025 % cream Apply 1 application topically daily in the afternoon.   TRULICITY 3 LZ/7.6BH SOPN Inject 3 mg into the skin every Thursday.    Allergies:   Shellfish allergy, Betadine [povidone iodine], and Povidone-iodine   Social History   Socioeconomic History   Marital status: Single    Spouse name: Not on file   Number of children: Not on file   Years of education: Not on file   Highest education level: Not on file  Occupational History    Comment: disabled  Tobacco Use   Smoking status: Never   Smokeless tobacco: Never  Vaping Use   Vaping Use: Never used  Substance and Sexual Activity   Alcohol use: Not Currently    Alcohol/week: 1.0 standard drink    Types: 1 Cans of beer per week    Comment: occasional   Drug use: Not Currently    Types: Marijuana    Comment: in high school    Sexual activity: Not on file  Other Topics Concern   Not on file  Social History Narrative   Patient uses continuous oxygen at 3L/min   Dialysis, AV fistula in L arm   Lives at home by himself ; friend gives pt. Transportation    Social Determinants of Health   Financial Resource Strain: Not on file  Food Insecurity: Not on file  Transportation Needs: Not on file  Physical Activity: Not on file  Stress: Not on file  Social Connections: Not on file     Family History:  The patient's family history includes Heart failure in his mother; Kidney failure in his brother.  ROS:   Review of Systems  Constitutional:  Positive  for malaise/fatigue. Negative for chills, diaphoresis, fever and weight loss.  HENT:  Negative for congestion.   Eyes:  Negative for discharge and redness.  Respiratory:  Negative for cough, sputum production, shortness of breath and wheezing.   Cardiovascular:  Positive for chest pain. Negative for palpitations, orthopnea, claudication, leg swelling and PND.  Gastrointestinal:  Positive for abdominal pain, heartburn, nausea and vomiting. Negative for melena.  Musculoskeletal:  Negative for falls and myalgias.  Skin:  Negative for rash.  Neurological:  Negative for dizziness, tingling, tremors, sensory change, speech change, focal weakness, loss of consciousness and weakness.  Endo/Heme/Allergies:  Does not bruise/bleed easily.  Psychiatric/Behavioral:  Negative for substance abuse. The patient is not nervous/anxious.     EKGs/Labs/Other Studies Reviewed:    Studies reviewed were summarized above. The additional studies were reviewed today:  LHC 08/08/2021: Conclusions: Severe two-vessel coronary artery disease including calcified 70-80% proximal LAD disease, 70% ostial D1 stenosis, and 90% lesion involving small-moderate caliber AV groove LCx following takeoff of OM1. Patent mid/distal RCA stent with minimal in-stent restenosis. Moderately elevated left ventricular filling pressure (LVEDP ~30 mmHg).   Recommendations: Review coronary imaging with heart team and primary cardiologist.  Given patient's diabetes mellitus and severe two-vessel coronary artery disease involving the proximal LAD, CABG and PCI would be reasonable options.  Given calcification of the proximal LAD, would favor any attempted intervention be performed where atherectomy and lithotripsy are immediately available. Aggressive secondary prevention of coronary artery disease. Recommend continued use of LifeVest pending revascularization and EP follow-up. Recommend escalation of fluid removal with hemodialysis given  moderately elevated left heart filling pressure. __________  2D echo 05/10/2021: 1. Left ventricular ejection fraction, by estimation, is 50 to 55%. The  left ventricle has low normal function. The left ventricle has no regional  wall motion abnormalities. There is mild left ventricular hypertrophy.  Left ventricular diastolic  parameters were normal.   2. Right ventricular systolic function is normal. The right ventricular  size is normal.   3. The mitral valve is normal in structure. No evidence of mitral valve  regurgitation.   4. The aortic valve is grossly normal. Aortic valve regurgitation is not  visualized.  __________   2D echo 01/29/2021: 1. Left ventricular ejection fraction, by estimation, is 55 to 60%. The  left ventricle has normal function. The left ventricle has no regional  wall motion abnormalities. There is moderate left ventricular hypertrophy.  Left ventricular diastolic  parameters are consistent with Grade II diastolic dysfunction  (pseudonormalization). Elevated left ventricular end-diastolic pressure.   2. Right ventricular systolic function is normal. The right ventricular  size is normal.   3. The mitral valve is normal in structure. No evidence of mitral valve  regurgitation. No evidence of mitral stenosis.   4. The aortic valve was not well visualized. Aortic valve regurgitation  is not visualized. Mild to moderate aortic valve sclerosis/calcification  is present, without any evidence of aortic stenosis.   5. The inferior vena cava is normal in size with greater than 50%  respiratory variability, suggesting right atrial pressure of 3 mmHg. __________   2D echo 09/29/2019: 1. Left ventricular ejection fraction, by visual estimation, is 45 to  50%. The left ventricle has mildly decreased function. Mildly increased  left ventricular size. There is moderately increased left ventricular  hypertrophy.   2. Elevated mean left atrial pressure.   3. Left  ventricular diastolic Doppler parameters are consistent with  pseudonormalization pattern of LV diastolic filling.   4. Global right ventricle has normal systolic function.The right  ventricular size is not well visualized. No increase in right ventricular  wall thickness.   5. Left atrial size was mildly dilated.   6. Right atrial size was not well visualized.   7. The pericardium was not well visualized.   8. The mitral valve is grossly normal. Trace mitral valve regurgitation.   9. The tricuspid valve is not well visualized. Tricuspid valve  regurgitation is  trivial.  10. The aortic valve was not well visualized Aortic valve regurgitation  was not visualized by color flow Doppler. There is no aortic valve  stenosis.  11. The pulmonic valve was not well visualized. Pulmonic valve  regurgitation is not visualized by color flow Doppler.  12. TR signal is inadequate for assessing pulmonary artery systolic  pressure.  13. The inferior vena cava is normal in size with greater than 50%  respiratory variability, suggesting right atrial pressure of 3 mmHg.  14. The interatrial septum was not well visualized.  __________   Carlton Adam MPI 08/2019: Pharmacological myocardial perfusion imaging study with no significant  Ischemia Small region fixed apical defect, likely secondary to attenuation artifact though unable to exclude small region of old infarct GI uptake artifact noted Mild global hypokinesis, EF estimated at 35% (depressed EF possibly exaggerated by GI uptake artifact) No EKG changes concerning for ischemia at peak stress or in recovery. Resting EKG with nonspecific ST abnormality CT attenuation corrected images with significant three-vessel coronary calcification, mild aortic atherosclerosis Low risk scan.   __________   2D echo 06/16/2019: 1. The left ventricle has low normal systolic function, with an ejection  fraction of 50-55%. The cavity size was normal. There is mildly  increased  left ventricular wall thickness. Left ventricular diastolic parameters  were normal.   2. The right ventricle has normal systolic function. The cavity was  normal. There is no increase in right ventricular wall thickness.   3. The aortic valve is tricuspid. Aortic valve regurgitation was not  assessed by color flow Doppler.     EKG:  EKG is ordered today.  The EKG ordered today demonstrates NSR, 93 bpm, rare PAC, poor R wave progression along the precordial leads, nonspecific ST-T changes  Recent Labs: 05/08/2021: ALT 16 05/19/2021: Magnesium 1.9 07/26/2021: BUN 19; Creatinine, Ser 5.61; Hemoglobin 11.6; Platelets 223; Potassium 3.8; Sodium 136  Recent Lipid Panel    Component Value Date/Time   CHOL 100 05/09/2021 0453   CHOL 116 04/21/2019 1334   TRIG 117 05/09/2021 0453   HDL 24 (L) 05/09/2021 0453   HDL 37 (L) 04/21/2019 1334   CHOLHDL 4.2 05/09/2021 0453   VLDL 23 05/09/2021 0453   LDLCALC 53 05/09/2021 0453   LDLCALC 63 04/21/2019 1334    PHYSICAL EXAM:    VS:  BP 112/78 (BP Location: Left Arm, Patient Position: Sitting, Cuff Size: Large)   Pulse 93   Ht 5\' 3"  (1.6 m)   Wt 253 lb 8 oz (115 kg)   SpO2 98% Comment: 98 % 3 Liters  BMI 44.91 kg/m   BMI: Body mass index is 44.91 kg/m.  Physical Exam Vitals reviewed.  Constitutional:      Appearance: He is well-developed.  HENT:     Head: Normocephalic and atraumatic.  Eyes:     General:        Right eye: No discharge.        Left eye: No discharge.  Neck:     Vascular: No JVD.  Cardiovascular:     Rate and Rhythm: Normal rate and regular rhythm.     Heart sounds: Normal heart sounds, S1 normal and S2 normal. Heart sounds not distant. No midsystolic click and no opening snap. No murmur heard.   No friction rub.  Pulmonary:     Effort: Pulmonary effort is normal. No respiratory distress.     Breath sounds: Normal breath sounds. No decreased breath sounds, wheezing or rales.  Chest:  Chest wall: No  tenderness.  Abdominal:     General: There is no distension.     Palpations: Abdomen is soft.     Tenderness: There is no abdominal tenderness.  Musculoskeletal:     Cervical back: Normal range of motion.     Right lower leg: No edema.     Left lower leg: No edema.  Skin:    General: Skin is warm and dry.     Nails: There is no clubbing.  Neurological:     Mental Status: He is alert and oriented to person, place, and time.  Psychiatric:        Speech: Speech normal.        Behavior: Behavior normal.        Thought Content: Thought content normal.        Judgment: Judgment normal.    Wt Readings from Last 3 Encounters:  08/15/21 253 lb 8 oz (115 kg)  08/08/21 251 lb 1.7 oz (113.9 kg)  07/26/21 251 lb (113.9 kg)     ASSESSMENT & PLAN:   CAD involving the native coronary arteries with stable angina and history of VF arrest: Currently without angina.  Given the above LHC, plan is to proceed with complex PCI at [facility].  Patient is a dialysis patient and refuses hemodialysis at [facility].  Given this, we will plan for LHC on 9/12.  Given complexity of case he will not be a same-day discharge.  Patient indicates he will undergo hemodialysis on 9/9 and 9/10.  Following his discharge on 9/13 he will return back to his outpatient dialysis center for his regularly scheduled dialysis on 9/14.  Continue aspirin and metoprolol.  Resume atorvastatin.  He declined precath labs today.  Risks and benefits of cardiac catheterization have been discussed with the patient including risks of bleeding, bruising, infection, kidney damage, stroke, heart attack, urgent need for bypass, injury to limb, and death. The patient understands these risks and is willing to proceed with the procedure. All questions have been answered and concerns listened to.   Chronic combined systolic and diastolic CHF secondary to ICM: Volume status is difficult to assess on physical exam secondary to body habitus.  Volume  management is largely maintained through hemodialysis.  He remains on metoprolol daily and furosemide on 9 hemodialysis days.  Relative hypotension, in the context of hemodialysis, precludes escalation of GDMT at this time.  If tolerated, consider adding BiDil  History of VF arrest: Wearing a LifeVest without any alarms, cons, or shocks.  Plan for percutaneous revascularization as outlined above.  Follow-up with EP as directed.  ESRD: He undergoes hemodialysis on Monday, Wednesday, and Fridays.  As noted above, he is adamant he does not want to undergo hemodialysis while at [facility] in [place].  He indicates he plans to undergo hemodialysis leading up to his cath, which is scheduled for 9/12, on 9/9 and 9/10 followed by dialysis on his regularly scheduled Wednesday, 9/14.  When asked who may need to contact to authorize this change he states he can just let the nurse know.  I will forward this note to his nephrologist for their update and review as well.  Should an urgent need arises at [facility], hemodialysis may be needed while he is admitted.  HTN: Blood pressure is well controlled in the office today.  Continue Toprol-XL 25 mg daily and Lasix 80 mg on non-hemodialysis days.  HLD: LDL 53 in 05/2021.  He indicates he has not been  on atorvastatin, stating someone at previously discontinued this medication.  I cannot find out why or when this medication was discontinued.  We did resume atorvastatin 40 mg daily at today's visit.  Goal LDL remains less than 70.  Morbid obesity with OSA and OHS: Weight loss is encouraged through heart healthy diet and regular exercise.  Continued adherence to CPAP recommended.  Chronic hypoxic respiratory failure: Stable.  Follow-up with PCP as directed.  Disposition: F/u with Dr. Garen Lah or an APP in 1-2 weeks post LHC, and EP as directed.   Medication Adjustments/Labs and Tests Ordered: Current medicines are reviewed at length with the patient today.   Concerns regarding medicines are outlined above. Medication changes, Labs and Tests ordered today are summarized above and listed in the Patient Instructions accessible in Encounters.   Signed, Christell Faith, PA-C 08/15/2021 5:24 PM     [place] [address] [place], [place] [address] [phone]

## 2021-08-15 NOTE — Patient Instructions (Addendum)
Medication Instructions:  - Your physician recommends that you continue on your current medications as directed. Please refer to the Current Medication list given to you today.  *If you need a refill on your cardiac medications before your next appointment, please call your pharmacy*   Lab Work: - Your physician recommends that you have lab work prior to 08/28/21: BMP/ CBC  If you have labs (blood work) drawn today and your tests are completely normal, you will receive your results only by: [place] (if you have MyChart) OR A paper copy in the mail If you have any lab test that is abnormal or we need to change your treatment, we will call you to review the results.   Testing/Procedures: - Your physician has requested that you have a cardiac catheterization. Cardiac catheterization is used to diagnose and/or treat various heart conditions. Doctors may recommend this procedure for a number of different reasons. The most common reason is to evaluate chest pain. Chest pain can be a symptom of coronary artery disease (CAD), and cardiac catheterization can show whether plaque is narrowing or blocking your heart's arteries. This procedure is also used to evaluate the valves, as well as measure the blood flow and oxygen levels in different parts of your heart.    You are scheduled for a Cardiac Catheterization on Monday, September 12 with Dr. Harrell Gave End.  1. Please arrive at the [facility] (Main Entrance A) at [facility]: [place], [place] [address] at 8:30 AM (This time is two hours before your procedure to ensure your preparation). Free valet parking service is available.   Special note: Every effort is made to have your procedure done on time. Please understand that emergencies sometimes delay scheduled procedures.  2. Diet: Do not eat solid foods after midnight.  You may have clear liquids until 5am upon the day of the procedure.  3. Labs: today  (08/15/21)  4. Medication instructions in preparation for your procedure:   Contrast Allergy: No   - HOLD lasix (furosemide) the morning of your procedure  - HOLD Glimeperide the morning of your procedure  - HOLD all insulin the morning of your test  - If you take any night time insulin, take 1/2 of your usual dose the night prior to your procedure  On the morning of your procedure, take your Aspirin and any morning medicines NOT listed above.  You may use sips of water.  5. Plan for one night stay--bring personal belongings. 6. Bring a current list of your medications and current insurance cards. 7. You MUST have a responsible person to drive you home. 8. Someone MUST be with you the first 24 hours after you arrive home or your discharge will be delayed. 9. Please wear clothes that are easy to get on and off and wear slip-on shoes.  Thank you for allowing [place] to care for you!   -- [facility]     Follow-Up: At [facility], you and your health needs are our priority.  As part of our continuing mission to provide you with exceptional heart care, we have created designated Provider Care Teams.  These Care Teams include your primary Cardiologist (physician) and Advanced Practice Providers (APPs -  Physician Assistants and Nurse Practitioners) who all work together to provide you with the care you need, when you need it.  We recommend signing up for the patient portal called "MyChart".  Sign up information is provided on this After  Visit Summary.  MyChart is used to connect with patients for Virtual Visits (Telemedicine).  Patients are able to view lab/test results, encounter notes, upcoming appointments, etc.  Non-urgent messages can be sent to your provider as well.   To learn more about what you can do with MyChart, go to [email or web address].    Your next appointment:   3-4 weeks  The format for your next appointment:   In  Person  Provider:   You may see Kate Sable, MD or one of the following Advanced Practice Providers on your designated Care Team:    Christell Faith, PA-C    Other Instructions  Per your preference, you will have Dialysis on Friday 9/9 & Saturday 9/10 in [place] (this will be prior to your heart cath scheduled on 08/28/21)  Coronary Angiogram A coronary angiogram is an X-ray procedure that is used to examine the arteries in the heart. Contrast dye is injected through a long, thin tube (catheter) into these arteries. Then X-rays are taken to show any blockage in thesearteries. You may have this procedure if you: Are having chest pain, or other symptoms of angina, and you are at risk for heart disease. Have an abnormal stress test or test of your heart's electrical activity (electrocardiogram, or ECG). Have chest pain and heart failure. Are having irregular heart rhythms. A coronary angiogram or heart catheterization can show if you have valve disease or a disease of the aorta. This procedure can also be used to check theoverall function of your heart muscle. Let your health care provider know about: Any allergies you have, including allergies to medicines or contrast dye. All medicines you are taking, including vitamins, herbs, eye drops, creams, and over-the-counter medicines. Any problems you or family members have had with anesthetic medicines. Any blood disorders you have. Any surgeries you have had. Any history of kidney problems or kidney failure. Any medical conditions you have. Whether you are pregnant or may be pregnant. Whether you are breastfeeding. What are the risks? Generally, this is a safe procedure. However, problems may occur, including: Infection. Allergic reaction to medicines or dyes that are used. Bleeding from the insertion site or other places. Damage to nearby structures, such as blood vessels, or damage to kidneys from contrast dye. Irregular heart  rhythms. Stroke (rare). Heart attack (rare). What happens before the procedure? Staying hydrated Follow instructions from your health care provider about hydration, which may include: Up to 2 hours before the procedure - you may continue to drink clear liquids, such as water, clear fruit juice, black coffee, and plain tea.  Eating and drinking restrictions Follow instructions from your health care provider about eating and drinking, which may include: 8 hours before the procedure - stop eating heavy meals or foods, such as meat, fried foods, or fatty foods. 6 hours before the procedure - stop eating light meals or foods, such as toast or cereal. 6 hours before the procedure - stop drinking milk or drinks that contain milk. 2 hours before the procedure - stop drinking clear liquids. Medicines Ask your health care provider about: Changing or stopping your regular medicines. This is especially important if you are taking diabetes medicines or blood thinners. Taking medicines such as aspirin and ibuprofen. These medicines can thin your blood. Do not take these medicines unless your health care provider tells you to take them. Aspirin may be recommended before coronary angiograms even if you do not normally take it. Taking over-the-counter medicines, vitamins, herbs, and supplements. General instructions  Do not use any products that contain nicotine or tobacco for at least 4 weeks before the procedure. These products include cigarettes, e-cigarettes, and chewing tobacco. If you need help quitting, ask your health care provider. You may have an exam or testing. Plan to have someone take you home from the hospital or clinic. If you will be going home right after the procedure, plan to have someone with you for 24 hours. Ask your health care provider: How your insertion site will be marked. What steps will be taken to help prevent infection. These may include: Removing hair at the insertion  site. Washing skin with a germ-killing soap. Taking antibiotic medicine. What happens during the procedure?  You will lie on your back on an X-ray table. An IV will be inserted into one of your veins. Electrodes will be placed on your chest. You will be given one or more of the following: A medicine to help you relax (sedative). A medicine to numb the catheter insertion area (local anesthetic). You will be connected to a continuous ECG monitor. The catheter will be inserted into an artery in one of these areas: Your groin area in your upper thigh. Your wrist. The fold of your arm, near your elbow. An X-ray procedure (fluoroscopy) will be used to help guide the catheter to the opening of the blood vessel to be used. A dye will be injected into the catheter and X-rays will be taken. The dye will help to show any narrowing or blockages in the heart arteries. Tell your health care provider if you have chest pain or trouble breathing. If blockages are found, another procedure may be done to open the artery. The catheter will be removed after the fluoroscopy is complete. A bandage (dressing) will be placed over the insertion site. Pressure will be applied to stop bleeding. The IV will be removed. The procedure may vary among health care providers and hospitals. What happens after the procedure? Your blood pressure, heart rate, breathing rate, and blood oxygen level will be monitored until you leave the hospital or clinic. You will need to lie still for a few hours, or for as long as told by your health care provider. If the procedure is done through the groin, you will be told not to bend or cross your legs. The insertion site and the pulse in your foot or wrist will be checked often. More blood tests, X-rays, and an ECG may be done. Do not drive for 24 hours if you were given a sedative during your procedure. Summary A coronary angiogram is an X-ray procedure that is used to examine the  arteries in the heart. Contrast dye is injected through a long, thin tube (catheter) into each artery. Tell your health care provider about any allergies you have, including allergies to contrast dye. After the procedure, you will need to lie still for a few hours and drink plenty of fluids. This information is not intended to replace advice given to you by your health care provider. Make sure you discuss any questions you have with your healthcare provider. Document Revised: 06/25/2019 Document Reviewed: 06/25/2019 Elsevier Patient Education  [place].

## 2021-08-16 ENCOUNTER — Telehealth (INDEPENDENT_AMBULATORY_CARE_PROVIDER_SITE_OTHER): Payer: Medicare Other | Admitting: Cardiology

## 2021-08-16 VITALS — Ht 63.0 in | Wt 246.9 lb

## 2021-08-16 DIAGNOSIS — I4901 Ventricular fibrillation: Secondary | ICD-10-CM

## 2021-08-16 DIAGNOSIS — I25118 Atherosclerotic heart disease of native coronary artery with other forms of angina pectoris: Secondary | ICD-10-CM

## 2021-08-16 NOTE — Patient Instructions (Signed)
Medication Instructions:  - Your physician recommends that you continue on your current medications as directed. Please refer to the Current Medication list given to you today.  *If you need a refill on your cardiac medications before your next appointment, please call your pharmacy*   Lab Work: - none ordered  If you have labs (blood work) drawn today and your tests are completely normal, you will receive your results only by: [place] (if you have MyChart) OR A paper copy in the mail If you have any lab test that is abnormal or we need to change your treatment, we will call you to review the results.   Testing/Procedures: - none ordered   Follow-Up: At [facility], you and your health needs are our priority.  As part of our continuing mission to provide you with exceptional heart care, we have created designated Provider Care Teams.  These Care Teams include your primary Cardiologist (physician) and Advanced Practice Providers (APPs -  Physician Assistants and Nurse Practitioners) who all work together to provide you with the care you need, when you need it.  We recommend signing up for the patient portal called "MyChart".  Sign up information is provided on this After Visit Summary.  MyChart is used to connect with patients for Virtual Visits (Telemedicine).  Patients are able to view lab/test results, encounter notes, upcoming appointments, etc.  Non-urgent messages can be sent to your provider as well.   To learn more about what you can do with MyChart, go to [email or web address].    Your next appointment:   3 month(s)  The format for your next appointment:   In Person  Provider:   Lars Mage, MD   Other Instructions N/a

## 2021-08-16 NOTE — Progress Notes (Signed)
Virtual Visit via Telephone Note   This visit type was conducted due to national recommendations for restrictions regarding the COVID-19 Pandemic (e.g. social distancing) in an effort to limit this patient's exposure and mitigate transmission in our community.  Due to his co-morbid illnesses, this patient is at least at moderate risk for complications without adequate follow up.  This format is felt to be most appropriate for this patient at this time.  The patient did not have access to video technology/had technical difficulties with video requiring transitioning to audio format only (telephone).  All issues noted in this document were discussed and addressed.  No physical exam could be performed with this format.  Please refer to the patient's chart for his  consent to telehealth for [facility].    Date:  08/16/2021   ID:  [name], DOB 16-Sep-1966, MRN [id number] The patient was identified using 2 identifiers.  Patient Location: Home Provider Location: Office/Clinic   PCP:  [facility]   [place] Providers Cardiologist:  Kate Sable, MD Electrophysiologist:  Vickie Epley, MD     Evaluation Performed:  Follow-Up Visit  Chief Complaint: Ventricular fibrillation  History of Present Illness:    [name] is a 55 y.o. male with ventricular fibrillation who presents for follow-up.  I last saw the patient July 26, 2021 in clinic.  I first met the patient in May after a VF arrest at the request of Dr. Saralyn Pilar.  We were awaiting a left heart catheterization to further evaluate for the etiology of his VF arrest.  He has since had his left heart catheterization which showed severe multivessel coronary artery disease.  He is awaiting PCI which is being scheduled by the interventional cardiologists.  He tells me he has been doing well since his heart catheterization.  He is wearing his LifeVest.  No shocks from his vest.  The patient does not  have symptoms concerning for COVID-19 infection (fever, chills, cough, or new shortness of breath).    Past Medical History:  Diagnosis Date   Anemia of chronic disease    Bell's palsy    Cataract    Chronic combined systolic (congestive) and diastolic (congestive) heart failure (HCC)    Chronic respiratory failure with hypoxia (HCC)    3 lpm continuous   COPD (chronic obstructive pulmonary disease) (HCC)    Coronary artery disease    Diabetes mellitus without complication (HCC)    Dyspnea    Wheezing   ESRD on hemodialysis (HCC)    GERD (gastroesophageal reflux disease)    Gout    HOH (hard of hearing)    mild   Hypercholesterolemia    Hypertension    NSVT (nonsustained ventricular tachycardia) (HCC)    Obstructive sleep apnea    CPAP, O2 use continuosly at 3LPM   Orthopnea    Pneumonia    In Past X2 most recent 1 yr ago   Psoriasis    Pulmonary HTN ([place])    Ventricular fibrillation [facility])    Past Surgical History:  Procedure Laterality Date   A/V FISTULAGRAM Right 05/02/2021   Procedure: A/V FISTULAGRAM;  Surgeon: Katha Cabal, MD;  Location: [place] [facility];  Service: Cardiovascular;  Laterality: Right;   AV FISTULA PLACEMENT Right 11/20/2019   Procedure: ARTERIOVENOUS (AV) FISTULA CREATION ( BRACHIAL CEPHALIC );  Surgeon: Katha Cabal, MD;  Location: [facility] ORS;  Service: Vascular;  Laterality: Right;   CATARACT EXTRACTION W/PHACO Left 12/17/2019  Procedure: CATARACT EXTRACTION PHACO AND INTRAOCULAR LENS PLACEMENT ([place]) LEFT ISTENT INJ DIABETIC;  Surgeon: Eulogio Bear, MD;  Location: [facility] ORS;  Service: Ophthalmology;  Laterality: Left;  [place] 00:33.2 CDE 2.96 Fluid Pack Lot # [id number]   CATARACT EXTRACTION W/PHACO Right 02/24/2020   Procedure: CATARACT EXTRACTION PHACO AND INTRAOCULAR LENS PLACEMENT (IOC) RIGHT DIABETIC ISTENT INJ;  Surgeon: Eulogio Bear, MD;  Location: [facility] ORS;  Service: Ophthalmology;  Laterality: Right;  [place] 00:37.3 CDE  2.88 Fluid Pack Lot # [id number]   CORONARY ANGIOPLASTY WITH STENT PLACEMENT     stent placement   DIALYSIS/PERMA CATHETER INSERTION N/A 06/22/2019   Procedure: DIALYSIS/PERMA CATHETER INSERTION;  Surgeon: Algernon Huxley, MD;  Location: [place] [facility];  Service: Cardiovascular;  Laterality: N/A;   DIALYSIS/PERMA CATHETER REMOVAL N/A 05/31/2020   Procedure: DIALYSIS/PERMA CATHETER REMOVAL;  Surgeon: Katha Cabal, MD;  Location: [place] CV LAB;  Service: Cardiovascular;  Laterality: N/A;   INSERTION OF AHMED VALVE Left 12/17/2019   Procedure: INSERTION OF iSTENT;  Surgeon: Eulogio Bear, MD;  Location: [facility] ORS;  Service: Ophthalmology;  Laterality: Left;   LEFT HEART CATH AND CORONARY ANGIOGRAPHY N/A 08/08/2021   Procedure: LEFT HEART CATH AND CORONARY ANGIOGRAPHY;  Surgeon: Nelva Bush, MD;  Location: [place] [facility];  Service: Cardiovascular;  Laterality: N/A;     Current Meds  Medication Sig   albuterol (PROVENTIL HFA;VENTOLIN HFA) 108 (90 Base) MCG/ACT inhaler Inhale 2 puffs into the lungs every 4 (four) hours as needed for wheezing or shortness of breath.   allopurinol (ZYLOPRIM) 100 MG tablet Take 100 mg by mouth daily.   ANORO ELLIPTA 62.5-25 MCG/INH AEPB INHALE 1 PUFF INTO THE LUNGS DAILY.   aspirin EC 81 MG tablet Take 81 mg by mouth in the morning. Swallow whole.   atorvastatin (LIPITOR) 40 MG tablet Take 1 tablet (40 mg total) by mouth at bedtime.   calcium acetate (PHOSLO) 667 MG capsule Take 667 mg by mouth 3 (three) times daily with meals.   Cholecalciferol (D3-1000) 25 MCG (1000 UT) tablet Take 2 tablets (2,000 Units total) by mouth daily.   cyclobenzaprine (FLEXERIL) 5 MG tablet Take 1 tablet (5 mg total) by mouth 2 (two) times daily as needed for muscle spasms.   furosemide (LASIX) 80 MG tablet Take 80 mg by mouth See admin instructions. Pt only takes when he doesn't have Dialysis days. Tues and Thurs Sat and Sun   gabapentin (NEURONTIN) 300 MG  capsule Take 300 mg by mouth at bedtime.   glimepiride (AMARYL) 4 MG tablet Take 4 mg by mouth daily with breakfast.   insulin degludec (TRESIBA FLEXTOUCH) 100 UNIT/ML FlexTouch Pen Inject 20 Units into the skin at bedtime.   isosorbide mononitrate (IMDUR) 60 MG 24 hr tablet Take 60 mg by mouth daily.   metoprolol succinate (TOPROL-XL) 25 MG 24 hr tablet Take 25 mg by mouth daily.   Multiple Vitamins-Minerals (TAB-A-VITE) TABS TAKE 1 TABLET BY MOUTH ONCE DAILY.   omeprazole (PRILOSEC) 20 MG capsule Take 1 capsule (20 mg total) by mouth 2 (two) times daily before a meal.   OXYGEN Inhale 3 L into the lungs daily.   Risankizumab-rzaa (SKYRIZI) 150 MG/ML SOSY Inject 150 mg into the skin as directed. Every 12 weeks for maintenance.   triamcinolone (KENALOG) 0.025 % cream Apply 1 application topically daily in the afternoon.   TRULICITY 3 SU/1.1SR SOPN Inject 3 mg into the skin every Thursday.     Allergies:  Shellfish allergy, Betadine [povidone iodine], and Povidone-iodine   Social History   Tobacco Use   Smoking status: Never   Smokeless tobacco: Never  Vaping Use   Vaping Use: Never used  Substance Use Topics   Alcohol use: Not Currently    Alcohol/week: 1.0 standard drink    Types: 1 Cans of beer per week    Comment: occasional   Drug use: Not Currently    Types: Marijuana    Comment: in high school      Family Hx: The patient's family history includes Heart failure in his mother; Kidney failure in his brother.  ROS:   Please see the history of present illness.     All other systems reviewed and are negative.   Prior CV studies:   The following studies were reviewed today:  August 08, 2021 left heart catheterization Conclusions: Severe two-vessel coronary artery disease including calcified 70-80% proximal LAD disease, 70% ostial D1 stenosis, and 90% lesion involving small-moderate caliber AV groove LCx following takeoff of OM1. Patent mid/distal RCA stent with minimal  in-stent restenosis. Moderately elevated left ventricular filling pressure (LVEDP ~30 mmHg).   Recommendations: Review coronary imaging with heart team and primary cardiologist.  Given patient's diabetes mellitus and severe two-vessel coronary artery disease involving the proximal LAD, CABG and PCI would be reasonable options.  Given calcification of the proximal LAD, would favor any attempted intervention be performed where atherectomy and lithotripsy are immediately available. Aggressive secondary prevention of coronary artery disease. Recommend continued use of LifeVest pending revascularization and EP follow-up. Recommend escalation of fluid removal with hemodialysis given moderately elevated left heart filling pressure.  Labs/Other Tests and Data Reviewed:    EKG:  No ECG reviewed.  Recent Labs: 05/08/2021: ALT 16 05/19/2021: Magnesium 1.9 07/26/2021: BUN 19; Creatinine, Ser 5.61; Hemoglobin 11.6; Platelets 223; Potassium 3.8; Sodium 136   Recent Lipid Panel Lab Results  Component Value Date/Time   CHOL 100 05/09/2021 04:53 AM   CHOL 116 04/21/2019 01:34 PM   TRIG 117 05/09/2021 04:53 AM   HDL 24 (L) 05/09/2021 04:53 AM   HDL 37 (L) 04/21/2019 01:34 PM   CHOLHDL 4.2 05/09/2021 04:53 AM   LDLCALC 53 05/09/2021 04:53 AM   LDLCALC 63 04/21/2019 01:34 PM    Wt Readings from Last 3 Encounters:  08/16/21 246 lb 14.6 oz (112 kg)  08/15/21 253 lb 8 oz (115 kg)  08/08/21 251 lb 1.7 oz (113.9 kg)     Risk Assessment/Calculations:          Objective:    Vital Signs:  Ht _0  (1.6 m)   Wt 246 lb 14.6 oz (112 kg)   BMI 43.74 kg/m    VITAL SIGNS:  reviewed PSYCH:  normal affect  ASSESSMENT & PLAN:    Severe multivessel coronary artery disease 2.  History of ventricular fibrillation cardiac arrest  Patient has a history of VF arrest.  He is now wearing LifeVest.  The mechanism of the rest seems to be ischemic in nature given the severe coronary artery disease noted on heart  catheterization last week.  He has an upcoming PCI scheduled.  I think we should plan to touch base in about 3 months after his PCI to reassess his left ventricular function.  If his left ventricular function is normal, would plan to continue monitoring of his cardiac condition and would not proceed with defibrillator implant.  If the patient has a depressed ejection fraction on the follow-up transthoracic echo, would proceed  with defibrillator implant.  If ICD is needed, would plan for a subcutaneous ICD.  I discussed the above plan in detail with the patient who is in agreement.  Follow-up 3 months.  Time:   Today, I have spent 7 minutes with the patient with telehealth technology discussing the above problems.     Medication Adjustments/Labs and Tests Ordered: Current medicines are reviewed at length with the patient today.  Concerns regarding medicines are outlined above.   Tests Ordered: No orders of the defined types were placed in this encounter.   Medication Changes: No orders of the defined types were placed in this encounter.   Follow Up: 3 months  Signed, Vickie Epley, MD  08/16/2021 1:20 PM    [facility]

## 2021-08-16 NOTE — Telephone Encounter (Signed)
complete

## 2021-08-17 ENCOUNTER — Telehealth: Payer: Self-pay | Admitting: Cardiology

## 2021-08-17 ENCOUNTER — Other Ambulatory Visit: Payer: Self-pay | Admitting: *Deleted

## 2021-08-17 ENCOUNTER — Telehealth: Payer: Self-pay | Admitting: *Deleted

## 2021-08-17 DIAGNOSIS — E7800 Pure hypercholesterolemia, unspecified: Secondary | ICD-10-CM

## 2021-08-17 MED ORDER — FUROSEMIDE 80 MG PO TABS: 80.0000 mg | ORAL_TABLET | ORAL | 2 refills | Status: DC

## 2021-08-17 MED ORDER — ATORVASTATIN CALCIUM 40 MG PO TABS: 40.0000 mg | ORAL_TABLET | Freq: Every day | ORAL | 1 refills | Status: DC

## 2021-08-17 NOTE — Telephone Encounter (Signed)
Patient states he has been awaiting a call regarding his transportation we are to be arranging for his procedure. Please call to discuss.

## 2021-08-17 NOTE — Telephone Encounter (Signed)
Called and spoke with pt re setting up transportation via [facility] transport service for upcoming cath procedure at [facility] 08/28/21.  Pt is agreeable to using [facility] transport service.  Home address listed in chart is correct, confirmed with pt.  Notified pt that we will contact transport and to expect a phone call from [facility] transport to confirm pick up time.  Pt voiced understanding.

## 2021-08-17 NOTE — Telephone Encounter (Signed)
Called and spoke with [facility]. Patient is scheduled to be picked up at 0947 on 08/24/21 and brought to our office for a scheduled lab draw at 10:30. He is also scheduled for pick up and transportation to [facility] on 08/28/21 with an arrival of 0830 for his scheduled overnight Cath Lab procedure.   Called patient and informed him of this.  Patient was very grateful for the call back.

## 2021-08-17 NOTE — Telephone Encounter (Signed)
Please see subsequent telephone encounter re transportation for further detail.

## 2021-08-24 ENCOUNTER — Other Ambulatory Visit
Admission: RE | Admit: 2021-08-24 | Discharge: 2021-08-24 | Disposition: A | Discharge status: Home / Self Care | Payer: Medicare Other | Attending: Physician Assistant | Admitting: Physician Assistant

## 2021-08-24 ENCOUNTER — Telehealth: Payer: Self-pay | Admitting: *Deleted

## 2021-08-24 ENCOUNTER — Other Ambulatory Visit: Payer: Medicare Other

## 2021-08-24 DIAGNOSIS — I25118 Atherosclerotic heart disease of native coronary artery with other forms of angina pectoris: Secondary | ICD-10-CM | POA: Diagnosis present

## 2021-08-24 DIAGNOSIS — Z01812 Encounter for preprocedural laboratory examination: Secondary | ICD-10-CM | POA: Diagnosis present

## 2021-08-24 LAB — CBC WITH DIFFERENTIAL/PLATELET
Abs Immature Granulocytes: 0.05 10*3/uL (ref 0.00–0.07)
Basophils Absolute: 0.1 10*3/uL (ref 0.0–0.1)
Basophils Relative: 1 %
Eosinophils Absolute: 0.6 10*3/uL — ABNORMAL HIGH (ref 0.0–0.5)
Eosinophils Relative: 5 %
HCT: 33.1 % — ABNORMAL LOW (ref 39.0–52.0)
Hemoglobin: 10.9 g/dL — ABNORMAL LOW (ref 13.0–17.0)
Immature Granulocytes: 1 %
Lymphocytes Relative: 20 %
Lymphs Abs: 2 10*3/uL (ref 0.7–4.0)
MCH: 29.6 pg (ref 26.0–34.0)
MCHC: 32.9 g/dL (ref 30.0–36.0)
MCV: 89.9 fL (ref 80.0–100.0)
Monocytes Absolute: 0.7 10*3/uL (ref 0.1–1.0)
Monocytes Relative: 7 %
Neutro Abs: 7 10*3/uL (ref 1.7–7.7)
Neutrophils Relative %: 66 %
Platelets: 226 10*3/uL (ref 150–400)
RBC: 3.68 MIL/uL — ABNORMAL LOW (ref 4.22–5.81)
RDW: 14.6 % (ref 11.5–15.5)
WBC: 10.4 10*3/uL (ref 4.0–10.5)
nRBC: 0 % (ref 0.0–0.2)

## 2021-08-24 LAB — BASIC METABOLIC PANEL
Anion gap: 13 (ref 5–15)
BUN: 47 mg/dL — ABNORMAL HIGH (ref 6–20)
CO2: 26 mmol/L (ref 22–32)
Calcium: 8.4 mg/dL — ABNORMAL LOW (ref 8.9–10.3)
Chloride: 98 mmol/L (ref 98–111)
Creatinine, Ser: 9.11 mg/dL — ABNORMAL HIGH (ref 0.61–1.24)
GFR, Estimated: 6 mL/min — ABNORMAL LOW (ref >60)
Glucose, Bld: 115 mg/dL — ABNORMAL HIGH (ref 70–99)
Potassium: 4.3 mmol/L (ref 3.5–5.1)
Sodium: 137 mmol/L (ref 135–145)

## 2021-08-24 NOTE — Telephone Encounter (Addendum)
Coronary Stent scheduled at [facility] for: Monday February 25, 2021 10:30 AM [facility] Main Entrance A [facility]) at: 8:30 AM   No solid food after midnight prior to cath, clear liquids until 5 AM day of procedure.  Medication instructions: Hold: -Glimepiride-AM of procedure -1/2 usual Insulin HS prior to procedure -Lasix-AM of procedure  Except hold medications morning medications can be taken pre-cath with sips of water including aspirin 81 mg.    Confirmed patient has responsible adult to drive home post procedure and be with patient first 24 hours after arriving home.  Patients are allowed one visitor in the waiting room during the time they are at the hospital for their procedure. Both patient and visitor must wear a mask once they enter the hospital.   Patient reports does not currently have any symptoms concerning for COVID-19 and no household members with COVID-19 like illness.                           Reviewed procedure/mask/visitor instructions with patient.                       I confirmed with [facility] transportation that they will provide transportation to [facility] Monday September 12, arrive 8:30 AM.

## 2021-08-28 ENCOUNTER — Encounter (HOSPITAL_COMMUNITY)
Admission: RE | Disposition: A | Discharge status: Home / Self Care | Payer: Medicare Other | Source: Home / Self Care | Attending: Internal Medicine

## 2021-08-28 ENCOUNTER — Ambulatory Visit: Payer: Medicare Other | Admitting: Cardiology

## 2021-08-28 ENCOUNTER — Ambulatory Visit (HOSPITAL_COMMUNITY)
Admission: RE | Admit: 2021-08-28 | Discharge: 2021-08-29 | Disposition: A | Discharge status: Home / Self Care | Payer: Medicare Other | Attending: Internal Medicine | Admitting: Internal Medicine

## 2021-08-28 ENCOUNTER — Encounter (HOSPITAL_COMMUNITY): Payer: Self-pay | Admitting: Internal Medicine

## 2021-08-28 ENCOUNTER — Other Ambulatory Visit: Payer: Self-pay

## 2021-08-28 DIAGNOSIS — Z7982 Long term (current) use of aspirin: Secondary | ICD-10-CM | POA: Diagnosis not present

## 2021-08-28 DIAGNOSIS — I4901 Ventricular fibrillation: Secondary | ICD-10-CM | POA: Diagnosis not present

## 2021-08-28 DIAGNOSIS — E875 Hyperkalemia: Secondary | ICD-10-CM

## 2021-08-28 DIAGNOSIS — Z79899 Other long term (current) drug therapy: Secondary | ICD-10-CM | POA: Diagnosis not present

## 2021-08-28 DIAGNOSIS — I5042 Chronic combined systolic (congestive) and diastolic (congestive) heart failure: Secondary | ICD-10-CM | POA: Insufficient documentation

## 2021-08-28 DIAGNOSIS — Z6841 Body Mass Index (BMI) 40.0 and over, adult: Secondary | ICD-10-CM | POA: Insufficient documentation

## 2021-08-28 DIAGNOSIS — Z8249 Family history of ischemic heart disease and other diseases of the circulatory system: Secondary | ICD-10-CM | POA: Insufficient documentation

## 2021-08-28 DIAGNOSIS — I25119 Atherosclerotic heart disease of native coronary artery with unspecified angina pectoris: Secondary | ICD-10-CM | POA: Insufficient documentation

## 2021-08-28 DIAGNOSIS — G4733 Obstructive sleep apnea (adult) (pediatric): Secondary | ICD-10-CM | POA: Diagnosis not present

## 2021-08-28 DIAGNOSIS — I132 Hypertensive heart and chronic kidney disease with heart failure and with stage 5 chronic kidney disease, or end stage renal disease: Secondary | ICD-10-CM | POA: Insufficient documentation

## 2021-08-28 DIAGNOSIS — E7800 Pure hypercholesterolemia, unspecified: Secondary | ICD-10-CM | POA: Insufficient documentation

## 2021-08-28 DIAGNOSIS — D631 Anemia in chronic kidney disease: Secondary | ICD-10-CM | POA: Insufficient documentation

## 2021-08-28 DIAGNOSIS — Z794 Long term (current) use of insulin: Secondary | ICD-10-CM | POA: Diagnosis not present

## 2021-08-28 DIAGNOSIS — Z955 Presence of coronary angioplasty implant and graft: Secondary | ICD-10-CM | POA: Insufficient documentation

## 2021-08-28 DIAGNOSIS — Z7984 Long term (current) use of oral hypoglycemic drugs: Secondary | ICD-10-CM | POA: Insufficient documentation

## 2021-08-28 DIAGNOSIS — N186 End stage renal disease: Secondary | ICD-10-CM | POA: Insufficient documentation

## 2021-08-28 DIAGNOSIS — Z888 Allergy status to other drugs, medicaments and biological substances status: Secondary | ICD-10-CM | POA: Diagnosis not present

## 2021-08-28 DIAGNOSIS — I10 Essential (primary) hypertension: Secondary | ICD-10-CM | POA: Diagnosis present

## 2021-08-28 DIAGNOSIS — E1122 Type 2 diabetes mellitus with diabetic chronic kidney disease: Secondary | ICD-10-CM | POA: Insufficient documentation

## 2021-08-28 DIAGNOSIS — Z992 Dependence on renal dialysis: Secondary | ICD-10-CM | POA: Diagnosis not present

## 2021-08-28 DIAGNOSIS — J9611 Chronic respiratory failure with hypoxia: Secondary | ICD-10-CM | POA: Diagnosis not present

## 2021-08-28 DIAGNOSIS — Z91013 Allergy to seafood: Secondary | ICD-10-CM | POA: Diagnosis not present

## 2021-08-28 DIAGNOSIS — E785 Hyperlipidemia, unspecified: Secondary | ICD-10-CM | POA: Diagnosis present

## 2021-08-28 DIAGNOSIS — I2510 Atherosclerotic heart disease of native coronary artery without angina pectoris: Secondary | ICD-10-CM | POA: Diagnosis present

## 2021-08-28 HISTORY — PX: CORONARY STENT INTERVENTION: CATH118234

## 2021-08-28 HISTORY — PX: INTRAVASCULAR IMAGING/OCT: CATH118326

## 2021-08-28 LAB — POCT ACTIVATED CLOTTING TIME
Activated Clotting Time: 271 seconds
Activated Clotting Time: 300 seconds
Activated Clotting Time: 265 seconds
Activated Clotting Time: 283 seconds
Activated Clotting Time: 306 seconds
Activated Clotting Time: 237 seconds
Activated Clotting Time: 202 seconds
Activated Clotting Time: 167 seconds

## 2021-08-28 LAB — GLUCOSE, CAPILLARY
Glucose-Capillary: 117 mg/dL — ABNORMAL HIGH (ref 70–99)
Glucose-Capillary: 81 mg/dL (ref 70–99)
Glucose-Capillary: 101 mg/dL — ABNORMAL HIGH (ref 70–99)

## 2021-08-28 LAB — CREATININE, SERUM
Creatinine, Ser: 10.15 mg/dL — ABNORMAL HIGH (ref 0.61–1.24)
GFR, Estimated: 6 mL/min — ABNORMAL LOW (ref >60)

## 2021-08-28 LAB — CBC
HCT: 35 % — ABNORMAL LOW (ref 39.0–52.0)
Hemoglobin: 10.8 g/dL — ABNORMAL LOW (ref 13.0–17.0)
MCH: 28.5 pg (ref 26.0–34.0)
MCHC: 30.9 g/dL (ref 30.0–36.0)
MCV: 92.3 fL (ref 80.0–100.0)
Platelets: 247 10*3/uL (ref 150–400)
RBC: 3.79 MIL/uL — ABNORMAL LOW (ref 4.22–5.81)
RDW: 14.5 % (ref 11.5–15.5)
WBC: 10.8 10*3/uL — ABNORMAL HIGH (ref 4.0–10.5)
nRBC: 0 % (ref 0.0–0.2)

## 2021-08-28 SURGERY — CORONARY STENT INTERVENTION
Anesthesia: LOCAL

## 2021-08-28 MED ORDER — NITROGLYCERIN 1 MG/10 ML FOR IR/CATH LAB
INTRA_ARTERIAL | Status: DC | PRN
  Administered 2021-08-28 (×2): 200 ug via INTRACORONARY

## 2021-08-28 MED ORDER — CLOPIDOGREL BISULFATE 75 MG PO TABS
75.0000 mg | ORAL_TABLET | Freq: Every day | ORAL | Status: DC
  Administered 2021-08-29: 75 mg via ORAL
  Filled 2021-08-28: qty 1

## 2021-08-28 MED ORDER — SODIUM CHLORIDE 0.9 % IV SOLN: 250.0000 mL | INTRAVENOUS | Status: DC | PRN

## 2021-08-28 MED ORDER — CALCIUM ACETATE (PHOS BINDER) 667 MG PO CAPS
667.0000 mg | ORAL_CAPSULE | Freq: Three times a day (TID) | ORAL | Status: DC
  Administered 2021-08-29: 667 mg via ORAL
  Filled 2021-08-28: qty 1

## 2021-08-28 MED ORDER — ASPIRIN 81 MG PO CHEW: 81.0000 mg | CHEWABLE_TABLET | ORAL | Status: DC

## 2021-08-28 MED ORDER — ACETAMINOPHEN 325 MG PO TABS: 650.0000 mg | ORAL_TABLET | ORAL | Status: DC | PRN

## 2021-08-28 MED ORDER — ADULT MULTIVITAMIN W/MINERALS CH
1.0000 | ORAL_TABLET | Freq: Every day | ORAL | Status: DC
  Administered 2021-08-29: 1 via ORAL
  Filled 2021-08-28: qty 1

## 2021-08-28 MED ORDER — HEPARIN SODIUM (PORCINE) 5000 UNIT/ML IJ SOLN
5000.0000 [IU] | Freq: Three times a day (TID) | INTRAMUSCULAR | Status: DC
  Administered 2021-08-28 – 2021-08-29 (×2): 5000 [IU] via SUBCUTANEOUS
  Filled 2021-08-28 (×2): qty 1

## 2021-08-28 MED ORDER — INSULIN GLARGINE-YFGN 100 UNIT/ML ~~LOC~~ SOLN
20.0000 [IU] | Freq: Every day | SUBCUTANEOUS | Status: DC
  Administered 2021-08-28: 20 [IU] via SUBCUTANEOUS
  Filled 2021-08-28 (×2): qty 0.2

## 2021-08-28 MED ORDER — HEPARIN SODIUM (PORCINE) 1000 UNIT/ML IJ SOLN
INTRAMUSCULAR | Status: AC
  Filled 2021-08-28: qty 1

## 2021-08-28 MED ORDER — FENTANYL CITRATE (PF) 100 MCG/2ML IJ SOLN
INTRAMUSCULAR | Status: DC | PRN
  Administered 2021-08-28 (×3): 25 ug via INTRAVENOUS

## 2021-08-28 MED ORDER — ASPIRIN EC 81 MG PO TBEC
81.0000 mg | DELAYED_RELEASE_TABLET | Freq: Every morning | ORAL | Status: DC
  Administered 2021-08-29: 81 mg via ORAL
  Filled 2021-08-28: qty 1

## 2021-08-28 MED ORDER — ATORVASTATIN CALCIUM 40 MG PO TABS
40.0000 mg | ORAL_TABLET | Freq: Every day | ORAL | Status: DC
  Administered 2021-08-28: 40 mg via ORAL
  Filled 2021-08-28: qty 1

## 2021-08-28 MED ORDER — HEPARIN SODIUM (PORCINE) 1000 UNIT/ML IJ SOLN
INTRAMUSCULAR | Status: DC | PRN
  Administered 2021-08-28 (×3): 2000 [IU] via INTRAVENOUS
  Administered 2021-08-28: 10000 [IU] via INTRAVENOUS

## 2021-08-28 MED ORDER — INSULIN DEGLUDEC 100 UNIT/ML ~~LOC~~ SOPN: 20.0000 [IU] | PEN_INJECTOR | Freq: Every day | SUBCUTANEOUS | Status: DC

## 2021-08-28 MED ORDER — LABETALOL HCL 5 MG/ML IV SOLN
INTRAVENOUS | Status: AC
  Filled 2021-08-28: qty 4

## 2021-08-28 MED ORDER — HEPARIN (PORCINE) IN NACL 1000-0.9 UT/500ML-% IV SOLN
INTRAVENOUS | Status: DC | PRN
  Administered 2021-08-28 (×3): 500 mL

## 2021-08-28 MED ORDER — HYDROMORPHONE HCL 1 MG/ML IJ SOLN
INTRAMUSCULAR | Status: AC
  Filled 2021-08-28: qty 0.5

## 2021-08-28 MED ORDER — MIDAZOLAM HCL 2 MG/2ML IJ SOLN
INTRAMUSCULAR | Status: AC
  Filled 2021-08-28: qty 2

## 2021-08-28 MED ORDER — ALLOPURINOL 100 MG PO TABS
100.0000 mg | ORAL_TABLET | Freq: Every day | ORAL | Status: DC
  Administered 2021-08-28 – 2021-08-29 (×2): 100 mg via ORAL
  Filled 2021-08-28 (×2): qty 1

## 2021-08-28 MED ORDER — MIDAZOLAM HCL 2 MG/2ML IJ SOLN
INTRAMUSCULAR | Status: DC | PRN
  Administered 2021-08-28: 2 mg via INTRAVENOUS
  Administered 2021-08-28: 1 mg via INTRAVENOUS

## 2021-08-28 MED ORDER — SODIUM CHLORIDE 0.9% FLUSH: 3.0000 mL | INTRAVENOUS | Status: DC | PRN

## 2021-08-28 MED ORDER — LIDOCAINE HCL (PF) 1 % IJ SOLN
INTRAMUSCULAR | Status: AC
  Filled 2021-08-28: qty 30

## 2021-08-28 MED ORDER — NITROGLYCERIN 1 MG/10 ML FOR IR/CATH LAB
INTRA_ARTERIAL | Status: AC
  Filled 2021-08-28: qty 10

## 2021-08-28 MED ORDER — ISOSORBIDE MONONITRATE ER 60 MG PO TB24
60.0000 mg | ORAL_TABLET | Freq: Every day | ORAL | Status: DC
  Administered 2021-08-28 – 2021-08-29 (×2): 60 mg via ORAL
  Filled 2021-08-28 (×2): qty 1

## 2021-08-28 MED ORDER — CLOPIDOGREL BISULFATE 300 MG PO TABS
ORAL_TABLET | ORAL | Status: DC | PRN
  Administered 2021-08-28: 600 mg via ORAL

## 2021-08-28 MED ORDER — SODIUM CHLORIDE 0.9% FLUSH
3.0000 mL | Freq: Two times a day (BID) | INTRAVENOUS | Status: DC
  Administered 2021-08-28 – 2021-08-29 (×2): 3 mL via INTRAVENOUS

## 2021-08-28 MED ORDER — FUROSEMIDE 10 MG/ML IJ SOLN
INTRAMUSCULAR | Status: AC
  Filled 2021-08-28: qty 8

## 2021-08-28 MED ORDER — CLOPIDOGREL BISULFATE 75 MG PO TABS: 600.0000 mg | ORAL_TABLET | ORAL | Status: DC

## 2021-08-28 MED ORDER — HYDROMORPHONE HCL 1 MG/ML IJ SOLN
0.5000 mg | INTRAMUSCULAR | Status: DC | PRN
  Administered 2021-08-28 (×2): 0.5 mg via INTRAVENOUS
  Filled 2021-08-28: qty 1

## 2021-08-28 MED ORDER — ALBUTEROL SULFATE (2.5 MG/3ML) 0.083% IN NEBU: 3.0000 mL | INHALATION_SOLUTION | RESPIRATORY_TRACT | Status: DC | PRN

## 2021-08-28 MED ORDER — FUROSEMIDE 10 MG/ML IJ SOLN
80.0000 mg | Freq: Every day | INTRAMUSCULAR | Status: DC
  Administered 2021-08-28 – 2021-08-29 (×2): 80 mg via INTRAVENOUS
  Filled 2021-08-28: qty 8

## 2021-08-28 MED ORDER — HYDRALAZINE HCL 20 MG/ML IJ SOLN: 10.0000 mg | INTRAMUSCULAR | Status: AC | PRN

## 2021-08-28 MED ORDER — IOHEXOL 350 MG/ML SOLN
INTRAVENOUS | Status: DC | PRN
  Administered 2021-08-28: 185 mL

## 2021-08-28 MED ORDER — OXYCODONE HCL 5 MG PO TABS
5.0000 mg | ORAL_TABLET | ORAL | Status: DC | PRN
  Administered 2021-08-28 – 2021-08-29 (×4): 5 mg via ORAL
  Filled 2021-08-28 (×4): qty 1

## 2021-08-28 MED ORDER — CLOPIDOGREL BISULFATE 300 MG PO TABS
ORAL_TABLET | ORAL | Status: AC
  Filled 2021-08-28: qty 2

## 2021-08-28 MED ORDER — LABETALOL HCL 5 MG/ML IV SOLN
10.0000 mg | INTRAVENOUS | Status: AC | PRN
  Administered 2021-08-28 (×2): 10 mg via INTRAVENOUS
  Filled 2021-08-28: qty 4

## 2021-08-28 MED ORDER — UMECLIDINIUM-VILANTEROL 62.5-25 MCG/INH IN AEPB
1.0000 | INHALATION_SPRAY | Freq: Every day | RESPIRATORY_TRACT | Status: DC
  Administered 2021-08-29: 1 via RESPIRATORY_TRACT
  Filled 2021-08-28: qty 14

## 2021-08-28 MED ORDER — SODIUM CHLORIDE 0.9% FLUSH: 3.0000 mL | Freq: Two times a day (BID) | INTRAVENOUS | Status: DC

## 2021-08-28 MED ORDER — HEPARIN (PORCINE) IN NACL 1000-0.9 UT/500ML-% IV SOLN
INTRAVENOUS | Status: AC
  Filled 2021-08-28: qty 1000

## 2021-08-28 MED ORDER — CYCLOBENZAPRINE HCL 10 MG PO TABS
5.0000 mg | ORAL_TABLET | Freq: Two times a day (BID) | ORAL | Status: DC | PRN
  Administered 2021-08-29: 5 mg via ORAL
  Filled 2021-08-28: qty 1

## 2021-08-28 MED ORDER — VITAMIN D 25 MCG (1000 UNIT) PO TABS
2000.0000 [IU] | ORAL_TABLET | Freq: Every day | ORAL | Status: DC
  Administered 2021-08-29: 2000 [IU] via ORAL
  Filled 2021-08-28: qty 2

## 2021-08-28 MED ORDER — FENTANYL CITRATE (PF) 100 MCG/2ML IJ SOLN
INTRAMUSCULAR | Status: AC
  Filled 2021-08-28: qty 2

## 2021-08-28 MED ORDER — ONDANSETRON HCL 4 MG/2ML IJ SOLN
4.0000 mg | Freq: Four times a day (QID) | INTRAMUSCULAR | Status: DC | PRN
  Administered 2021-08-28: 4 mg via INTRAVENOUS
  Filled 2021-08-28: qty 2

## 2021-08-28 MED ORDER — INSULIN ASPART 100 UNIT/ML IJ SOLN
0.0000 [IU] | Freq: Three times a day (TID) | INTRAMUSCULAR | Status: DC
Start: 2021-08-29 — End: 2021-08-29

## 2021-08-28 MED ORDER — PANTOPRAZOLE SODIUM 40 MG PO TBEC
40.0000 mg | DELAYED_RELEASE_TABLET | Freq: Every day | ORAL | Status: DC
  Administered 2021-08-28 – 2021-08-29 (×2): 40 mg via ORAL
  Filled 2021-08-28 (×2): qty 1

## 2021-08-28 MED ORDER — SODIUM CHLORIDE 0.9 % IV SOLN: INTRAVENOUS | Status: DC

## 2021-08-28 MED ORDER — METOPROLOL SUCCINATE ER 25 MG PO TB24
25.0000 mg | ORAL_TABLET | Freq: Every day | ORAL | Status: DC
  Administered 2021-08-28 – 2021-08-29 (×2): 25 mg via ORAL
  Filled 2021-08-28 (×2): qty 1

## 2021-08-28 MED ORDER — LIDOCAINE HCL (PF) 1 % IJ SOLN
INTRAMUSCULAR | Status: DC | PRN
  Administered 2021-08-28: 10 mL

## 2021-08-28 MED ORDER — GABAPENTIN 300 MG PO CAPS
300.0000 mg | ORAL_CAPSULE | Freq: Every day | ORAL | Status: DC
  Administered 2021-08-28: 300 mg via ORAL
  Filled 2021-08-28: qty 1

## 2021-08-28 SURGICAL SUPPLY — 25 items
BALLN EMERGE MR 2.0X8 (BALLOONS) ×2
BALLN EUPHORA RX 3.5X6 (BALLOONS) ×2
BALLN SAPPHIRE 3.0X15 (BALLOONS) ×2
BALLN SAPPHIRE ~~LOC~~ 4.0X12 (BALLOONS) ×2 IMPLANT
BALLN ~~LOC~~ EMERGE MR 3.25X6 (BALLOONS) ×2
BALLOON EMERGE MR 2.0X8 (BALLOONS) ×1 IMPLANT
BALLOON EUPHORA RX 3.5X6 (BALLOONS) ×1 IMPLANT
BALLOON SAPPHIRE 3.0X15 (BALLOONS) ×1 IMPLANT
BALLOON ~~LOC~~ EMERGE MR 3.25X6 (BALLOONS) ×1 IMPLANT
CATH DRAGONFLY OPTIS 2.7FR (CATHETERS) ×2 IMPLANT
CATH INFINITI 5FR ANG PIGTAIL (CATHETERS) ×2 IMPLANT
CATH LAUNCHER 6FR EBU 3.75 (CATHETERS) ×2 IMPLANT
ELECT DEFIB PAD ADLT CADENCE (PAD) ×2 IMPLANT
KIT ENCORE 26 ADVANTAGE (KITS) ×2 IMPLANT
KIT HEART LEFT (KITS) ×2 IMPLANT
KIT MICROPUNCTURE NIT STIFF (SHEATH) ×2 IMPLANT
PACK CARDIAC CATHETERIZATION (CUSTOM PROCEDURE TRAY) ×2 IMPLANT
SHEATH PINNACLE 6F 10CM (SHEATH) ×2 IMPLANT
STENT ONYX FRONTIER 2.75X08 (Permanent Stent) ×2 IMPLANT
STENT ONYX FRONTIER 3.5X18 (Permanent Stent) ×2 IMPLANT
TRANSDUCER W/STOPCOCK (MISCELLANEOUS) ×2 IMPLANT
TUBING CIL FLEX 10 FLL-RA (TUBING) ×2 IMPLANT
WIRE COUGAR XT STRL 190CM (WIRE) ×2 IMPLANT
WIRE EMERALD 3MM-J .035X260CM (WIRE) ×2 IMPLANT
WIRE RUNTHROUGH .014X180CM (WIRE) ×2 IMPLANT

## 2021-08-28 NOTE — Progress Notes (Signed)
Site area: right groin  Site Prior to Removal:  Level 0  Pressure Applied For 20 MINUTES    Minutes Beginning at 1845  Manual:   Yes.    Patient Status During Pull:  Stable   Post Pull Groin Site:  Level 0  Post Pull Instructions Given:  Yes.    Post Pull Pulses Present:  Yes.    Dressing Applied:  Yes.    Comments:  Bedrest started at 1905.

## 2021-08-28 NOTE — Brief Op Note (Signed)
BRIEF CARDIAC CATHETERIZATION NOTE  DATE: 08/28/2021  TIME: 1:42 PM  PATIENT:  [name]  55 y.o. male  PRE-OPERATIVE DIAGNOSIS:  Coronary artery disease and ventricular fibrillation cardiac arrest  POST-OPERATIVE DIAGNOSIS:  Same  PROCEDURE:  Procedure(s): CORONARY STENT INTERVENTION (N/A)  SURGEON:  Surgeon(s) and Role:    * Peja Allender, MD - Primary  FINDINGS: Severe two-vessel coronary artery disease, as detailed on recent diagnostic catheterization with 70% proximal LAD stenosis and 99% mid LCx lesion. Moderately elevated left ventricular filling pressure (LVEDP ~25-30 mmHg). Successful OCT-guided PCI to proximal LAD using Onyx Frontier 3.5 x 18 mm drug-eluting stent with 0% residual stenosis and TIMI-3 flow. Successful OCT-guided PCI to mid LCx using Onyx Frontier 2.75 x 8 mm drug-eluting stent with 0% residual stenosis and TIMI-3 flow.  RECOMMENDATIONS: Overnight recovery. DAPT with ASA and clopidogrel for at least 6 months, ideally longer. Aggressive secondary prevention. Continue to escalate fluid removal with HD, as tolerated.  Nelva Bush, MD [facility]

## 2021-08-28 NOTE — Progress Notes (Signed)
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 D/C IV - RN and cath team aware.

## 2021-08-28 NOTE — Interval H&P Note (Signed)
History and Physical Interval Note:  08/28/2021 10:55 AM  [name]  has presented today for surgery, with the diagnosis of ventricular fibrillation cardiac arrest and multivessel coronary artery disease.  The various methods of treatment have been discussed with the patient and family. After consideration of risks, benefits and other options for treatment, the patient has consented to  Procedure(s): CORONARY STENT INTERVENTION (N/A) as a surgical intervention.  The patient's history has been reviewed, patient examined, no change in status, stable for surgery.  Following diagnostic catheterization last month, patient's angiograms were reviewed with the interventional cardiology team.  It was felt that PCI to LAD and LCx with possible coronary lithotripsy/atherectomy would be most reasonable option, particularly given the patient's comorbidities and strong desire to avoid CABG.  I have reviewed the patient's chart and labs.  Questions were answered to the patient's satisfaction.  Cath Lab Visit (complete for each Cath Lab visit)  Clinical Evaluation Leading to the Procedure:   ACS: No.  Non-ACS:    Anginal Classification: CCS II  Anti-ischemic medical therapy: Maximal Therapy (2 or more classes of medications)  Non-Invasive Test Results: Low-risk stress test findings: cardiac mortality <1%/year  Prior CABG: No previous CABG  [name]

## 2021-08-29 ENCOUNTER — Other Ambulatory Visit (HOSPITAL_COMMUNITY): Payer: Self-pay

## 2021-08-29 ENCOUNTER — Encounter (HOSPITAL_COMMUNITY): Payer: Self-pay | Admitting: Internal Medicine

## 2021-08-29 DIAGNOSIS — I132 Hypertensive heart and chronic kidney disease with heart failure and with stage 5 chronic kidney disease, or end stage renal disease: Secondary | ICD-10-CM | POA: Diagnosis not present

## 2021-08-29 DIAGNOSIS — N186 End stage renal disease: Secondary | ICD-10-CM | POA: Diagnosis not present

## 2021-08-29 DIAGNOSIS — I5042 Chronic combined systolic (congestive) and diastolic (congestive) heart failure: Secondary | ICD-10-CM | POA: Diagnosis not present

## 2021-08-29 DIAGNOSIS — Z992 Dependence on renal dialysis: Secondary | ICD-10-CM

## 2021-08-29 DIAGNOSIS — I10 Essential (primary) hypertension: Secondary | ICD-10-CM | POA: Diagnosis not present

## 2021-08-29 DIAGNOSIS — I4901 Ventricular fibrillation: Secondary | ICD-10-CM | POA: Diagnosis not present

## 2021-08-29 DIAGNOSIS — E875 Hyperkalemia: Secondary | ICD-10-CM | POA: Diagnosis not present

## 2021-08-29 DIAGNOSIS — I25119 Atherosclerotic heart disease of native coronary artery with unspecified angina pectoris: Secondary | ICD-10-CM | POA: Diagnosis not present

## 2021-08-29 LAB — BASIC METABOLIC PANEL
Anion gap: 14 (ref 5–15)
BUN: 60 mg/dL — ABNORMAL HIGH (ref 6–20)
CO2: 25 mmol/L (ref 22–32)
Calcium: 8.4 mg/dL — ABNORMAL LOW (ref 8.9–10.3)
Chloride: 96 mmol/L — ABNORMAL LOW (ref 98–111)
Creatinine, Ser: 11 mg/dL — ABNORMAL HIGH (ref 0.61–1.24)
GFR, Estimated: 5 mL/min — ABNORMAL LOW (ref >60)
Glucose, Bld: 107 mg/dL — ABNORMAL HIGH (ref 70–99)
Potassium: 5.8 mmol/L — ABNORMAL HIGH (ref 3.5–5.1)
Sodium: 135 mmol/L (ref 135–145)

## 2021-08-29 LAB — CBC
HCT: 35.5 % — ABNORMAL LOW (ref 39.0–52.0)
Hemoglobin: 11.2 g/dL — ABNORMAL LOW (ref 13.0–17.0)
MCH: 29.1 pg (ref 26.0–34.0)
MCHC: 31.5 g/dL (ref 30.0–36.0)
MCV: 92.2 fL (ref 80.0–100.0)
Platelets: 216 10*3/uL (ref 150–400)
RBC: 3.85 MIL/uL — ABNORMAL LOW (ref 4.22–5.81)
RDW: 14.6 % (ref 11.5–15.5)
WBC: 10.6 10*3/uL — ABNORMAL HIGH (ref 4.0–10.5)
nRBC: 0 % (ref 0.0–0.2)

## 2021-08-29 LAB — GLUCOSE, CAPILLARY: Glucose-Capillary: 106 mg/dL — ABNORMAL HIGH (ref 70–99)

## 2021-08-29 MED ORDER — PANTOPRAZOLE SODIUM 40 MG PO TBEC
40.0000 mg | DELAYED_RELEASE_TABLET | Freq: Every day | ORAL | 3 refills | Status: DC
  Filled 2021-08-29: qty 90, 90d supply, fill #0

## 2021-08-29 MED ORDER — CLOPIDOGREL BISULFATE 75 MG PO TABS
75.0000 mg | ORAL_TABLET | Freq: Every day | ORAL | 3 refills | Status: DC
  Filled 2021-08-29: qty 90, 90d supply, fill #0

## 2021-08-29 NOTE — Progress Notes (Signed)
CARDIAC REHAB PHASE I   PRE:  Rate/Rhythm: 92 SR  BP:  Supine: 136/95  Sitting:   Standing:    SaO2: 100% 3L  MODE:  Ambulation: 400 ft   POST:  Rate/Rhythm: 96 SR  BP:  Supine:   Sitting: 151/98  Standing:    SaO2: 92-95% 3L 0812-0910 Pt walked 400 ft on 3L with steady gait. No CP or dizziness. I managed monitor and oxygen tank. To sitting on side of bed after walk. Reviewed importance of plavix with stent and gave stent card. Reviewed NTG use, walking as tolerated and CRP 2. Referral to [place] program. Pt has HD on MWF. Encouraged pt to talk with dietitian at HD re diet. Pt voiced understanding.   Graylon Good, RN BSN  08/29/2021 9:10 AM

## 2021-08-29 NOTE — Discharge Summary (Addendum)
Discharge Summary    Patient ID: [name] MRN: [id number]; DOB: 12/30/1965  Admit date: 08/28/2021 Discharge date: 08/29/2021  PCP:  [facility] Cardiologist:  Kate Sable, MD  Electrophysiologist:  Vickie Epley, MD  {  Discharge Diagnoses    Principal Problem:   CAD (coronary artery disease) Active Problems:   Essential hypertension   ESRD (end stage renal disease) on dialysis ([place])   Hyperlipidemia, unspecified   Hyperkalemia   Ventricular fibrillation ([place])   Coronary artery disease    Diagnostic Studies/Procedures    Cath: 08/28/21  Conclusions: Severe two-vessel coronary artery disease, as detailed on recent diagnostic catheterization. Moderately elevated left ventricular filling pressure (LVEDP 25-30 mmHg). Successful OCT-guided PCI to proximal LAD using Onyx Frontier 3.5 x 18 mm drug-eluting stent (post-dilated to 4.1 mm) with 0% residual stenosis and TIMI-3 flow. Successful OCT-guided PCI to mid LCx using Onyx Frontier 2.75 x 8 mm drug-eluting stent (postdilated to 3.5 mm) with 0% residual stenosis and TIMI-3 flow.   Recommendations: Dual antiplatelet therapy with aspirin and clopidogrel for at least 6 months, ideally longer. Aggressive secondary prevention of coronary artery disease. Escalate diuresis today given elevated LVEDP.  If evidence of worsening respiratory failure or significant electrolyte disturbance, favor hemodialysis prior to discharge tomorrow.  Otherwise, patient should continue usual outpatient HD schedule. Case discussed with Dr. Quentin Ore (EP).  We recommend continuation of LifeVest at discharge with follow-up echocardiogram in ~1 month.  If LVEF is normal at that time, LifeVest could be discontinued and ICD placement deferred (further consultation with Dr. Quentin Ore recommended at that time).   Nelva Bush, MD [facility]  Diagnostic Dominance:  Right Intervention   _____________   History of Present Illness     [name] is a 55 y.o. male with with history of CAD with NSTEMI status post PCI/BMS to the RCA in 05/2015 at [facility], chronic combined systolic and diastolic CHF, ICM, chronic hypoxic respiratory failure ESRD on HD, DM2 with diabetic nephropathy, HTN, anemia of chronic disease, morbid obesity with OHS, and OSA on CPAP who presented to the office on 8/30 for follow up after recent cath.    2D echo performed at [facility] in 04/2018 showed an EF of 35%, mildly enlarged LV internal cavity size, mild LVH, hypokinesis of the posterior and inferior myocardium, no significant valvular abnormalities, and a trivial pericardial effusion.  Nuclear stress test at [facility] in 04/2018 showed an area of mild decreased activity in the anterior segment stress images which improved with rest and favored to be attenuation artifact, though cannot completely exclude mild single-vessel reversible ischemia with a dilated LV at stress and rest with global hypokinesis with an EF calculated at 39%.  CT attenuation corrected images showed coronary artery calcification.   He established care with Dr. Garen Lah in 08/2019 with Lexiscan MPI at that time showing no significant ischemia with a small region of fixed apical defect felt to likely be secondary to attenuation artifact though unable to exclude a small region of old infarct, mild global hypokinesis with an EF estimated at 35%.  CT attenuation corrected images showed significant three-vessel coronary artery calcification and mild aortic atherosclerosis.  Overall, this was a low risk scan.  Echo in 09/2019 showed an EF of 45 to 50%, mildly increased LV cavity size, moderate LVH, diastolic dysfunction, normal RV systolic function, mildly dilated left atrium, trace mitral regurgitation, trivial tricuspid regurgitation.   He was admitted to [facility] from 5/23 through 6/3  for acute on chronic hypoxic respiratory failure  requiring mechanical ventilation and severe encephalopathy complicated by VF arrest occurring with dialysis which was treated per ACLS protocol, with 12 minutes of CPR.  High-sensitivity troponin peaked at 208.  He was consulted on by outside cardiology group on 05/10/2021.  Echo showed an EF of 50-55%, no RWMA, mild LVH, normal LV diastolic function parameters, normal RV systolic function and ventricular cavity size, and no significant valvular abnormalities. LHC was recommended when patient was more alert. He was evaluated by Dr. Quentin Ore, for consideration of ICD implantation, with the patient being very much against implantation of an ICD. His VF arrest was possibly felt to be in the setting of underlying ESRD with electrolyte abnormalities including hyperkalemia with a potassium of 7.2.  LHC and LifeVest were recommended prior to discharge by EP.  Note indicates the patient was not interested in [facility].  He was not discharged with a LifeVest.     LifeVest was subsequently applied in early 07/2021 after reconsidering.  He was evaluate by EP later in 07/2021, with recommendation for subcutaneous ICD, pending LHC results.  He underwent LHC on 08/08/2021 that showed proximal LAD calcified and eccentric 75% stenosis, D1 70% stenosis, mid LCx 90% stenosis, proximal RCA 25% stenosis, mid to distal RCA 10% ISR.  Recommendation was made to defer PCI at that time with plans for complex PCI at [facility].    Seen in the office on 8/30 and schedule for cath at [facility].   Hospital Course    CAD: Previously underwent cardiac cath with calcified lesion in the LAD and LCx at [facility]. He was brought to [facility] as an outpatient for 2v PCI/DES. Successful OCT guided PCI to pLAD with DESx1, and DESx1 to mLcx. Plan for DAPT with ASA/Plavix for at least 6 months, ideally longer.  No chest pain overnight, seen by CR.  -- continue ASA, plavix, metoprolol, imdur and statin  Chronic combined systolic and diastolic CHF secondary to ICM: Volume  status is difficult to assess 2/2 body habitus. LVEDP 25-30 on cath. Reports having done HD Friday and Saturday in prep for missing Monday. Given IV lasix 80mg  daily post cath.  -- on metoprolol daily and furosemide 80mg  daily on non- hemodialysis days, will continue the same. -- Relative hypotension, in the context of hemodialysis, has limited GDMT    History of VF arrest: Wearing a LifeVest without any alarms, cons, or shocks.  -- planned to continue Lifevest with EP follow up as recommended. He voiced understanding of need to continue wearing.   ESRD: He undergoes hemodialysis on Monday, Wednesday, and Fridays. Did HD on Friday and Saturday (per his report in prep for missing Monday). Unfortunately his K+ is 5.8 but he is refusing to undergo HD here at [facility] and says he will not get HD until scheduled on Wednesday 9/14.  -- Dr. Gwenlyn Found discussed the risk of refusal of HD including possible Vfib/cardiac arrest/death. Patient remains unwilling to have HD completed prior to discharge.  Patient voiced understanding of risk of leaving without HD.    HTN: Blood pressures are stable.  -- Continue Toprol-XL 25 mg daily and Lasix 80 mg on non-hemodialysis days.   HLD: LDL 53 in 05/2021.   -- Atorvastatin 40 mg daily was resumed at recent office visit. Goal LDL remains less than 70.   Morbid obesity with OSA and OHS: Lifestyle modifications reviewed -- CPAP compliance encouraged   Chronic hypoxic respiratory failure: Stable.  -- Follow-up with PCP  GERD: switched to protonix at discharge given the need for plavix.   Patient was seen by Dr. Gwenlyn Found and cleared for discharge home. Follow up in the office arranged. Medications sent to the [facility] pharmacy prior to discharge. Educated by PharmD.   Did the patient have an acute coronary syndrome (MI, NSTEMI, STEMI, etc) this admission?:  No                               Did the patient have a percutaneous coronary intervention (stent / angioplasty)?:  Yes.      Cath/PCI Registry Performance & Quality Measures: Aspirin prescribed? - Yes ADP Receptor Inhibitor (Plavix/Clopidogrel, Brilinta/Ticagrelor or Effient/Prasugrel) prescribed (includes medically managed patients)? - Yes High Intensity Statin (Lipitor 40-80mg  or Crestor 20-40mg ) prescribed? - Yes For EF <40%, was ACEI/ARB prescribed? - Not Applicable (EF >/= 17%) For EF <40%, Aldosterone Antagonist (Spironolactone or Eplerenone) prescribed? - Not Applicable (EF >/= 61%) Cardiac Rehab Phase II ordered? - Yes   _____________  Discharge Vitals Blood pressure (!) 136/95, pulse 96, temperature (!) 97.5 F (36.4 C), temperature source Oral, resp. rate 16, height 5\' 3"  (1.6 m), weight 114.8 kg, SpO2 98 %.  Filed Weights   08/28/21 0823  Weight: 114.8 kg    Labs & Radiologic Studies    CBC Recent Labs    08/28/21 1954 08/29/21 0249  WBC 10.8* 10.6*  HGB 10.8* 11.2*  HCT 35.0* 35.5*  MCV 92.3 92.2  PLT 247 607   Basic Metabolic Panel Recent Labs    08/28/21 1954 08/29/21 0731  NA  --  135  K  --  5.8*  CL  --  96*  CO2  --  25  GLUCOSE  --  107*  BUN  --  60*  CREATININE 10.15* 11.00*  CALCIUM  --  8.4*   Liver Function Tests No results for input(s): AST, ALT, ALKPHOS, BILITOT, PROT, ALBUMIN in the last 72 hours. No results for input(s): LIPASE, AMYLASE in the last 72 hours. High Sensitivity Troponin:   No results for input(s): TROPONINIHS in the last 720 hours.  BNP Invalid input(s): POCBNP D-Dimer No results for input(s): DDIMER in the last 72 hours. Hemoglobin A1C No results for input(s): HGBA1C in the last 72 hours. Fasting Lipid Panel No results for input(s): CHOL, HDL, LDLCALC, TRIG, CHOLHDL, LDLDIRECT in the last 72 hours. Thyroid Function Tests No results for input(s): TSH, T4TOTAL, T3FREE, THYROIDAB in the last 72 hours.  Invalid input(s): FREET3 _____________  CARDIAC CATHETERIZATION  Result Date: 08/28/2021 Conclusions: Severe two-vessel coronary  artery disease, as detailed on recent diagnostic catheterization. Moderately elevated left ventricular filling pressure (LVEDP 25-30 mmHg). Successful OCT-guided PCI to proximal LAD using Onyx Frontier 3.5 x 18 mm drug-eluting stent (post-dilated to 4.1 mm) with 0% residual stenosis and TIMI-3 flow. Successful OCT-guided PCI to mid LCx using Onyx Frontier 2.75 x 8 mm drug-eluting stent (postdilated to 3.5 mm) with 0% residual stenosis and TIMI-3 flow. Recommendations: Dual antiplatelet therapy with aspirin and clopidogrel for at least 6 months, ideally longer. Aggressive secondary prevention of coronary artery disease. Escalate diuresis today given elevated LVEDP.  If evidence of worsening respiratory failure or significant electrolyte disturbance, favor hemodialysis prior to discharge tomorrow.  Otherwise, patient should continue usual outpatient HD schedule. Case discussed with Dr. Quentin Ore (EP).  We recommend continuation of LifeVest at discharge with follow-up echocardiogram in ~1 month.  If LVEF is normal at that time, LifeVest  could be discontinued and ICD placement deferred (further consultation with Dr. Quentin Ore recommended at that time). Nelva Bush, MD [facility]  CARDIAC CATHETERIZATION  Result Date: 08/08/2021 Conclusions: Severe two-vessel coronary artery disease including calcified 70-80% proximal LAD disease, 70% ostial D1 stenosis, and 90% lesion involving small-moderate caliber AV groove LCx following takeoff of OM1. Patent mid/distal RCA stent with minimal in-stent restenosis. Moderately elevated left ventricular filling pressure (LVEDP ~30 mmHg). Recommendations: Review coronary imaging with heart team and primary cardiologist.  Given patient's diabetes mellitus and severe two-vessel coronary artery disease involving the proximal LAD, CABG and PCI would be reasonable options.  Given calcification of the proximal LAD, would favor any attempted intervention be performed where atherectomy  and lithotripsy are immediately available. Aggressive secondary prevention of coronary artery disease. Recommend continued use of LifeVest pending revascularization and EP follow-up. Recommend escalation of fluid removal with hemodialysis given moderately elevated left heart filling pressure. Nelva Bush, MD [facility]  Disposition   Pt is being discharged home today in good condition.  Follow-up Plans & Appointments     Follow-up Information     Rise Mu, PA-C Follow up on 09/05/2021.   Specialties: Physician Assistant, Cardiology, Radiology Why: at 11am for your follow up appt Contact information: [place]  [id number] [phone]                Discharge Instructions     Amb Referral to Cardiac Rehabilitation   Complete by: As directed    Diagnosis: Coronary Stents   After initial evaluation and assessments completed: Virtual Based Care may be provided alone or in conjunction with Phase 2 Cardiac Rehab based on patient barriers.: Yes   Call MD for:  difficulty breathing, headache or visual disturbances   Complete by: As directed    Call MD for:  persistant dizziness or light-headedness   Complete by: As directed    Call MD for:  redness, tenderness, or signs of infection (pain, swelling, redness, odor or green/yellow discharge around incision site)   Complete by: As directed    Diet - low sodium heart healthy   Complete by: As directed    Discharge instructions   Complete by: As directed    Groin Site Care Refer to this sheet in the next few weeks. These instructions provide you with information on caring for yourself after your procedure. Your caregiver may also give you more specific instructions. Your treatment has been planned according to current medical practices, but problems sometimes occur. Call your caregiver if you have any problems or questions after your procedure. HOME CARE INSTRUCTIONS You may shower 24 hours  after the procedure. Remove the bandage (dressing) and gently wash the site with plain soap and water. Gently pat the site dry.  Do not apply powder or lotion to the site.  Do not sit in a bathtub, swimming pool, or whirlpool for 5 to 7 days.  No bending, squatting, or lifting anything over 10 pounds (4.5 kg) as directed by your caregiver.  Inspect the site at least twice daily.  Do not drive home if you are discharged the same day of the procedure. Have someone else drive you.  You may drive 24 hours after the procedure unless otherwise instructed by your caregiver.  What to expect: Any bruising will usually fade within 1 to 2 weeks.  Blood that collects in the tissue (hematoma) may be painful to the touch. It should usually decrease in size and tenderness  within 1 to 2 weeks.  SEEK IMMEDIATE MEDICAL CARE IF: You have unusual pain at the groin site or down the affected leg.  You have redness, warmth, swelling, or pain at the groin site.  You have drainage (other than a small amount of blood on the dressing).  You have chills.  You have a fever or persistent symptoms for more than 72 hours.  You have a fever and your symptoms suddenly get worse.  Your leg becomes pale, cool, tingly, or numb.  You have heavy bleeding from the site. Hold pressure on the site. .  You have been advised of the need for dialysis prior to discharge given your elevated potassium levels. Dr. Gwenlyn Found advised you of the risk of leaving with the elevated potassium levels and cardiac arrest/death. Please ensure you DO NOT miss your dialysis session you are scheduled for tomorrow at your usual site. If you developed chest pain, shortness of breath, palpitations, dizziness, etc you need to seek immediate medical attention!!!!   Increase activity slowly   Complete by: As directed        Discharge Medications   Allergies as of 08/29/2021       Reactions   Shellfish Allergy Anaphylaxis   Face and throat swelling,  difficulty breathing Allergy can be triggered by touching (contact)   Betadine [povidone Iodine] Rash   Povidone-iodine Rash        Medication List     STOP taking these medications    omeprazole 20 MG capsule Commonly known as: PRILOSEC Replaced by: pantoprazole 40 MG tablet       TAKE these medications    albuterol 108 (90 Base) MCG/ACT inhaler Commonly known as: VENTOLIN HFA Inhale 2 puffs into the lungs every 4 (four) hours as needed for wheezing or shortness of breath.   allopurinol 100 MG tablet Commonly known as: ZYLOPRIM Take 100 mg by mouth daily.   Anoro Ellipta 62.5-25 MCG/INH Aepb Generic drug: umeclidinium-vilanterol INHALE 1 PUFF INTO THE LUNGS DAILY.   aspirin EC 81 MG tablet Take 81 mg by mouth in the morning. Swallow whole.   atorvastatin 40 MG tablet Commonly known as: LIPITOR Take 1 tablet (40 mg total) by mouth at bedtime.   calcium acetate 667 MG capsule Commonly known as: PHOSLO Take 667 mg by mouth 3 (three) times daily with meals.   Cholecalciferol 25 MCG (1000 UT) tablet Commonly known as: D3-1000 Take 2 tablets (2,000 Units total) by mouth daily.   clopidogrel 75 MG tablet Commonly known as: PLAVIX Take 1 tablet (75 mg total) by mouth daily with breakfast. Start taking on: August 30, 2021   cyclobenzaprine 5 MG tablet Commonly known as: FLEXERIL Take 1 tablet (5 mg total) by mouth 2 (two) times daily as needed for muscle spasms.   furosemide 80 MG tablet Commonly known as: LASIX Take 1 tablet (80 mg total) by mouth See admin instructions. Pt only takes when he doesn't have Dialysis days. Tues and Thurs Sat and Sun   gabapentin 300 MG capsule Commonly known as: NEURONTIN Take 300 mg by mouth at bedtime.   glimepiride 4 MG tablet Commonly known as: AMARYL Take 4 mg by mouth daily with breakfast.   isosorbide mononitrate 60 MG 24 hr tablet Commonly known as: IMDUR Take 60 mg by mouth daily.   metoprolol succinate 25  MG 24 hr tablet Commonly known as: TOPROL-XL Take 25 mg by mouth daily.   OXYGEN Inhale 3 L into the lungs daily.  pantoprazole 40 MG tablet Commonly known as: PROTONIX Take 1 tablet (40 mg total) by mouth daily. Replaces: omeprazole 20 MG capsule   Skyrizi 150 MG/ML Sosy Generic drug: Risankizumab-rzaa Inject 150 mg into the skin as directed. Every 12 weeks for maintenance.   Tab-A-Vite Tabs TAKE 1 TABLET BY MOUTH ONCE DAILY.   Tyler Aas FlexTouch 100 UNIT/ML FlexTouch Pen Generic drug: insulin degludec Inject 20 Units into the skin at bedtime.   triamcinolone 0.025 % cream Commonly known as: KENALOG Apply 1 application topically daily in the afternoon.   Trulicity 3 IR/6.7EL Sopn Generic drug: Dulaglutide Inject 3 mg into the skin every Thursday.        Outstanding Labs/Studies   N/a   Duration of Discharge Encounter   Greater than 30 minutes including physician time.  Signed, Reino Bellis, NP 08/29/2021, 10:23 AM   Agree with note by Reino Bellis NP-C  Mr. Dosher underwent staged proximal LAD and mid AV groove circumflex PCI drug-eluting stenting yesterday by Dr. Saunders Revel.  He was asymptomatic this morning.  He he does have a serum creatinine of 5.8 and has chronic renal insufficiency on dialysis.  We suggested that he stay for hemodialysis today but the patient was adamant about being discharged despite the risks which we outlined.  He did have a VF arrest in the past.  He is on a LifeVest and is being followed by EP.  He apparently has an 8 dialysis appointment scheduled for tomorrow.  He is on dual antiplatelet therapy.  Dr. Saunders Revel will follow-up in the office.   Lorretta Harp, M.D., [place], [facility], [place] [address] [place], [place]  [address]  [phone] 08/29/2021 11:24 AM

## 2021-09-04 NOTE — Progress Notes (Signed)
Cardiology Office Note    Date:  09/05/2021   ID:  [name], DOB 12-07-1966, MRN [id number]  PCP:  [facility]  Cardiologist:  Kate Sable, MD  Electrophysiologist:  Vickie Epley, MD   Chief Complaint: Hospital follow-up  History of Present Illness:   [name] is a 55 y.o. male with history of CAD with NSTEMI status post PCI/BMS to the RCA in 05/2015 at [facility] status post PCI to the LAD and LCx on 08/28/2021, chronic combined systolic and diastolic CHF, ICM, chronic hypoxic respiratory failure ESRD on HD, DM2 with diabetic nephropathy, HTN, anemia of chronic disease, morbid obesity with OHS, and OSA on CPAP who presents for [facility] follow up.   2D echo performed at [facility] in 04/2018 showed an EF of 35%, mildly enlarged LV internal cavity size, mild LVH, hypokinesis of the posterior and inferior myocardium, no significant valvular abnormalities, and a trivial pericardial effusion.  Nuclear stress test at [facility] in 04/2018 showed an area of mild decreased activity in the anterior segment stress images which improved with rest and favored to be attenuation artifact, though cannot completely exclude mild single-vessel reversible ischemia with a dilated LV at stress and rest with global hypokinesis with an EF calculated at 39%.  CT attenuation corrected images showed coronary artery calcification.   He established care with Dr. Garen Lah in 08/2019 with Lexiscan MPI at that time showing no significant ischemia with a small region of fixed apical defect felt to likely be secondary to attenuation artifact though unable to exclude a small region of old infarct, mild global hypokinesis with an EF estimated at 35%.  CT attenuation corrected images showed significant three-vessel coronary artery calcification and mild aortic atherosclerosis.  Overall, this was a low risk scan.  Echo in 09/2019 showed an EF of 45 to 50%, mildly increased LV cavity size, moderate LVH, diastolic  dysfunction, normal RV systolic function, mildly dilated left atrium, trace mitral regurgitation, trivial tricuspid regurgitation.   He was to [facility] from 5/23 through 6/3 for acute on chronic hypoxic respiratory failure requiring mechanical ventilation and severe encephalopathy complicated by VF arrest occurring with dialysis which was treated per ACLS protocol, with 12 minutes of CPR.  High-sensitivity troponin peaked at 208.  He was consulted on by outside cardiology group on 05/10/2021.  Echo showed an EF of 50-55%, no RWMA, mild LVH, normal LV diastolic function parameters, normal RV systolic function and ventricular cavity size, and no significant valvular abnormalities. LHC was recommended when patient was more alert. He was evaluated by Dr. Quentin Ore, for consideration of ICD implantation, with the patient being very much against implantation of an ICD. His VF arrest was possibly felt to be in the setting of underlying ESRD with electrolyte abnormalities including hyperkalemia with a potassium of 7.2.  LHC and LifeVest were recommended prior to discharge by EP.  Note indicates the patient was not interested in [facility].  He was not discharged with a LifeVest.     LifeVest was subsequently applied in early 07/2021 after reconsidering.  He was evaluate by EP later in 07/2021, with recommendation for subcutaneous ICD, pending LHC results.  He underwent LHC on 08/08/2021 that showed proximal LAD calcified and eccentric 75% stenosis, D1 70% stenosis, mid LCx 90% stenosis, proximal RCA 25% stenosis, mid to distal RCA 10% ISR.  Recommendation was made to defer PCI at that time with plans for complex PCI at [facility].    At his post cath follow-up on 08/15/2021  he did note 1 episode of substernal chest pressure.  He was agreeable to proceed with recommended cath at [facility].  He did note he would refuse hemodialysis at [facility].  He presented to [facility] on 08/28/2021 for planned LHC which again demonstrated severe  two-vessel CAD as detailed below.  He underwent successful OCT guided PCI to the proximal LAD as well as OCT guided PCI to the mid LCx.  LVEDP was elevated with recommendation to escalate diuresis through hemodialysis.  Case was discussed with EP with recommendation to continue LifeVest with a follow-up echo in approximately 1 month.  Despite risk of death, he continued to refuse hemodialysis during his admission.  He comes in doing reasonably well from a cardiac perspective.  No angina, dyspnea, palpitations, dizziness, presyncope, syncope.  No alarms or shocks from LifeVest.  He is tolerating DAPT.  He does note some BRBPR on the outside surface of his stool with associated straining and constipation.  He reports a history of hemorrhoids.  He has been adherent with hemodialysis.  He indicates he will not go back to [facility] in [place] for any reason moving forward.  He did receive a bill for his LifeVest.   Labs independently reviewed: 08/2021 - potassium 5.8, BUN 60, serum creatinine 11, Hgb 11.2, PLT 216 06/2021 - TSH normal 05/2021 - magnesium 1.9, TC 100, TG 117, HDL 24, LDL 53, A1c 7.1, albumin 4.3, AST/ALT not elevated  Past Medical History:  Diagnosis Date   Anemia of chronic disease    Bell's palsy    Cataract    Chronic combined systolic (congestive) and diastolic (congestive) heart failure (HCC)    Chronic respiratory failure with hypoxia (HCC)    3 lpm continuous   COPD (chronic obstructive pulmonary disease) (HCC)    Coronary artery disease    Diabetes mellitus without complication (HCC)    Dyspnea    Wheezing   ESRD on hemodialysis (HCC)    GERD (gastroesophageal reflux disease)    Gout    HOH (hard of hearing)    mild   Hypercholesterolemia    Hypertension    NSVT (nonsustained ventricular tachycardia) (HCC)    Obstructive sleep apnea    CPAP, O2 use continuosly at 3LPM   Orthopnea    Pneumonia    In Past X2 most recent 1 yr ago   Psoriasis     Pulmonary HTN ([place])    Ventricular fibrillation [facility])     Past Surgical History:  Procedure Laterality Date   A/V FISTULAGRAM Right 05/02/2021   Procedure: A/V FISTULAGRAM;  Surgeon: Katha Cabal, MD;  Location: [place] [facility];  Service: Cardiovascular;  Laterality: Right;   AV FISTULA PLACEMENT Right 11/20/2019   Procedure: ARTERIOVENOUS (AV) FISTULA CREATION ( BRACHIAL CEPHALIC );  Surgeon: Katha Cabal, MD;  Location: [facility] ORS;  Service: Vascular;  Laterality: Right;   CATARACT EXTRACTION W/PHACO Left 12/17/2019   Procedure: CATARACT EXTRACTION PHACO AND INTRAOCULAR LENS PLACEMENT ([place]) LEFT ISTENT INJ DIABETIC;  Surgeon: Eulogio Bear, MD;  Location: [facility] ORS;  Service: Ophthalmology;  Laterality: Left;  [place] 00:33.2 CDE 2.96 Fluid Pack Lot # [id number]   CATARACT EXTRACTION W/PHACO Right 02/24/2020   Procedure: CATARACT EXTRACTION PHACO AND INTRAOCULAR LENS PLACEMENT (IOC) RIGHT DIABETIC ISTENT INJ;  Surgeon: Eulogio Bear, MD;  Location: [facility] ORS;  Service: Ophthalmology;  Laterality: Right;  [place] 00:37.3 CDE 2.88 Fluid Pack Lot # [id number]   CORONARY ANGIOPLASTY WITH STENT PLACEMENT  stent placement   CORONARY STENT INTERVENTION N/A 08/28/2021   Procedure: CORONARY STENT INTERVENTION;  Surgeon: Nelva Bush, MD;  Location: [place] [facility];  Service: Cardiovascular;  Laterality: N/A;   DIALYSIS/PERMA CATHETER INSERTION N/A 06/22/2019   Procedure: DIALYSIS/PERMA CATHETER INSERTION;  Surgeon: Algernon Huxley, MD;  Location: [place] [facility];  Service: Cardiovascular;  Laterality: N/A;   DIALYSIS/PERMA CATHETER REMOVAL N/A 05/31/2020   Procedure: DIALYSIS/PERMA CATHETER REMOVAL;  Surgeon: Katha Cabal, MD;  Location: [place] [facility];  Service: Cardiovascular;  Laterality: N/A;   INSERTION OF AHMED VALVE Left 12/17/2019   Procedure: INSERTION OF iSTENT;  Surgeon: Eulogio Bear, MD;  Location: [facility] OR[facility];  Service: Ophthalmology;  Laterality: Left;    INTRAVASCULAR IMAGING/OCT N/A 08/28/2021   Procedure: INTRAVASCULAR IMAGING/OCT;  Surgeon: Nelva Bush, MD;  Location: [place] [facility];  Service: Cardiovascular;  Laterality: N/A;   LEFT HEART CATH AND CORONARY ANGIOGRAPHY N/A 08/08/2021   Procedure: LEFT HEART CATH AND CORONARY ANGIOGRAPHY;  Surgeon: Nelva Bush, MD;  Location: [place] [facility];  Service: Cardiovascular;  Laterality: N/A;    Current Medications: Current Meds  Medication Sig   albuterol (PROVENTIL HFA;VENTOLIN HFA) 108 (90 Base) MCG/ACT inhaler Inhale 2 puffs into the lungs every 4 (four) hours as needed for wheezing or shortness of breath.   allopurinol (ZYLOPRIM) 100 MG tablet Take 100 mg by mouth daily.   ANORO ELLIPTA 62.5-25 MCG/INH AEPB INHALE 1 PUFF INTO THE LUNGS DAILY.   aspirin EC 81 MG tablet Take 81 mg by mouth in the morning. Swallow whole.   atorvastatin (LIPITOR) 40 MG tablet Take 1 tablet (40 mg total) by mouth at bedtime.   calcium acetate (PHOSLO) 667 MG capsule Take 667 mg by mouth 3 (three) times daily with meals.   Cholecalciferol (D3-1000) 25 MCG (1000 UT) tablet Take 2 tablets (2,000 Units total) by mouth daily.   clopidogrel (PLAVIX) 75 MG tablet Take 1 tablet (75 mg total) by mouth daily with breakfast.   cyclobenzaprine (FLEXERIL) 5 MG tablet Take 1 tablet (5 mg total) by mouth 2 (two) times daily as needed for muscle spasms.   docusate sodium (COLACE) 100 MG capsule Take 1 capsule (100 mg total) by mouth daily.   furosemide (LASIX) 80 MG tablet Take 1 tablet (80 mg total) by mouth See admin instructions. Pt only takes when he doesn't have Dialysis days. Tues and Thurs Sat and Sun   gabapentin (NEURONTIN) 300 MG capsule Take 300 mg by mouth at bedtime.   glimepiride (AMARYL) 4 MG tablet Take 4 mg by mouth daily with breakfast.   insulin degludec (TRESIBA FLEXTOUCH) 100 UNIT/ML FlexTouch Pen Inject 20 Units into the skin at bedtime.   isosorbide mononitrate (IMDUR) 60 MG 24 hr tablet  Take 60 mg by mouth daily.   losartan (COZAAR) 50 MG tablet Take 50 mg by mouth daily.   metoprolol succinate (TOPROL-XL) 25 MG 24 hr tablet Take 25 mg by mouth daily.   Multiple Vitamins-Minerals (TAB-A-VITE) TABS TAKE 1 TABLET BY MOUTH ONCE DAILY.   OXYGEN Inhale 3 L into the lungs daily.   pantoprazole (PROTONIX) 40 MG tablet Take 1 tablet (40 mg total) by mouth daily.   Risankizumab-rzaa (SKYRIZI) 150 MG/ML SOSY Inject 150 mg into the skin as directed. Every 12 weeks for maintenance.   triamcinolone (KENALOG) 0.025 % cream Apply 1 application topically daily in the afternoon.   TRULICITY 3 WU/9.8JX SOPN Inject 3 mg into the skin every Thursday.  Allergies:   Shellfish allergy, Betadine [povidone iodine], and Povidone-iodine   Social History   Socioeconomic History   Marital status: Single    Spouse name: Not on file   Number of children: Not on file   Years of education: Not on file   Highest education level: Not on file  Occupational History    Comment: disabled  Tobacco Use   Smoking status: Never   Smokeless tobacco: Never  Vaping Use   Vaping Use: Never used  Substance and Sexual Activity   Alcohol use: Yes    Alcohol/week: 1.0 standard drink    Types: 1 Cans of beer per week    Comment: rare occassion   Drug use: Not Currently    Types: Marijuana    Comment: in high school    Sexual activity: Not on file  Other Topics Concern   Not on file  Social History Narrative   Patient uses continuous oxygen at 3L/min   Dialysis, AV fistula in L arm   Lives at home by himself ; friend gives pt. Transportation    Social Determinants of Health   Financial Resource Strain: Not on file  Food Insecurity: Not on file  Transportation Needs: Not on file  Physical Activity: Not on file  Stress: Not on file  Social Connections: Not on file     Family History:  The patient's family history includes Heart failure in his mother; Kidney failure in his brother.  ROS:    Review of Systems  Constitutional:  Positive for malaise/fatigue. Negative for chills, diaphoresis, fever and weight loss.  HENT:  Negative for congestion.   Eyes:  Negative for discharge and redness.  Respiratory:  Negative for cough, sputum production, shortness of breath and wheezing.   Cardiovascular:  Negative for chest pain, palpitations, orthopnea, claudication, leg swelling and PND.  Gastrointestinal:  Positive for blood in stool and constipation. Negative for abdominal pain, heartburn, melena, nausea and vomiting.  Musculoskeletal:  Negative for falls and myalgias.  Skin:  Negative for rash.  Neurological:  Negative for dizziness, tingling, tremors, sensory change, speech change, focal weakness, loss of consciousness and weakness.  Endo/Heme/Allergies:  Bruises/bleeds easily.  Psychiatric/Behavioral:  Negative for substance abuse. The patient is not nervous/anxious.   All other systems reviewed and are negative.   EKGs/Labs/Other Studies Reviewed:    Studies reviewed were summarized above. The additional studies were reviewed today:  LHC 08/28/2021: Conclusions: Severe two-vessel coronary artery disease, as detailed on recent diagnostic catheterization. Moderately elevated left ventricular filling pressure (LVEDP 25-30 mmHg). Successful OCT-guided PCI to proximal LAD using Onyx Frontier 3.5 x 18 mm drug-eluting stent (post-dilated to 4.1 mm) with 0% residual stenosis and TIMI-3 flow. Successful OCT-guided PCI to mid LCx using Onyx Frontier 2.75 x 8 mm drug-eluting stent (postdilated to 3.5 mm) with 0% residual stenosis and TIMI-3 flow.   Recommendations: Dual antiplatelet therapy with aspirin and clopidogrel for at least 6 months, ideally longer. Aggressive secondary prevention of coronary artery disease. Escalate diuresis today given elevated LVEDP.  If evidence of worsening respiratory failure or significant electrolyte disturbance, favor hemodialysis prior to discharge  tomorrow.  Otherwise, patient should continue usual outpatient HD schedule. Case discussed with Dr. Quentin Ore (EP).  We recommend continuation of LifeVest at discharge with follow-up echocardiogram in ~1 month.  If LVEF is normal at that time, LifeVest could be discontinued and ICD placement deferred (further consultation with Dr. Quentin Ore recommended at that time). __________  LHC 08/08/2021: Conclusions: Severe two-vessel coronary artery  disease including calcified 70-80% proximal LAD disease, 70% ostial D1 stenosis, and 90% lesion involving small-moderate caliber AV groove LCx following takeoff of OM1. Patent mid/distal RCA stent with minimal in-stent restenosis. Moderately elevated left ventricular filling pressure (LVEDP ~30 mmHg).   Recommendations: Review coronary imaging with heart team and primary cardiologist.  Given patient's diabetes mellitus and severe two-vessel coronary artery disease involving the proximal LAD, CABG and PCI would be reasonable options.  Given calcification of the proximal LAD, would favor any attempted intervention be performed where atherectomy and lithotripsy are immediately available. Aggressive secondary prevention of coronary artery disease. Recommend continued use of LifeVest pending revascularization and EP follow-up. Recommend escalation of fluid removal with hemodialysis given moderately elevated left heart filling pressure. __________   2D echo 05/10/2021: 1. Left ventricular ejection fraction, by estimation, is 50 to 55%. The  left ventricle has low normal function. The left ventricle has no regional  wall motion abnormalities. There is mild left ventricular hypertrophy.  Left ventricular diastolic  parameters were normal.   2. Right ventricular systolic function is normal. The right ventricular  size is normal.   3. The mitral valve is normal in structure. No evidence of mitral valve  regurgitation.   4. The aortic valve is grossly normal. Aortic  valve regurgitation is not  visualized.  __________   2D echo 01/29/2021: 1. Left ventricular ejection fraction, by estimation, is 55 to 60%. The  left ventricle has normal function. The left ventricle has no regional  wall motion abnormalities. There is moderate left ventricular hypertrophy.  Left ventricular diastolic  parameters are consistent with Grade II diastolic dysfunction  (pseudonormalization). Elevated left ventricular end-diastolic pressure.   2. Right ventricular systolic function is normal. The right ventricular  size is normal.   3. The mitral valve is normal in structure. No evidence of mitral valve  regurgitation. No evidence of mitral stenosis.   4. The aortic valve was not well visualized. Aortic valve regurgitation  is not visualized. Mild to moderate aortic valve sclerosis/calcification  is present, without any evidence of aortic stenosis.   5. The inferior vena cava is normal in size with greater than 50%  respiratory variability, suggesting right atrial pressure of 3 mmHg. __________   2D echo 09/29/2019: 1. Left ventricular ejection fraction, by visual estimation, is 45 to  50%. The left ventricle has mildly decreased function. Mildly increased  left ventricular size. There is moderately increased left ventricular  hypertrophy.   2. Elevated mean left atrial pressure.   3. Left ventricular diastolic Doppler parameters are consistent with  pseudonormalization pattern of LV diastolic filling.   4. Global right ventricle has normal systolic function.The right  ventricular size is not well visualized. No increase in right ventricular  wall thickness.   5. Left atrial size was mildly dilated.   6. Right atrial size was not well visualized.   7. The pericardium was not well visualized.   8. The mitral valve is grossly normal. Trace mitral valve regurgitation.   9. The tricuspid valve is not well visualized. Tricuspid valve  regurgitation is trivial.  10. The  aortic valve was not well visualized Aortic valve regurgitation  was not visualized by color flow Doppler. There is no aortic valve  stenosis.  11. The pulmonic valve was not well visualized. Pulmonic valve  regurgitation is not visualized by color flow Doppler.  12. TR signal is inadequate for assessing pulmonary artery systolic  pressure.  13. The inferior vena cava is normal  in size with greater than 50%  respiratory variability, suggesting right atrial pressure of 3 mmHg.  14. The interatrial septum was not well visualized.  __________   Carlton Adam MPI 08/2019: Pharmacological myocardial perfusion imaging study with no significant  Ischemia Small region fixed apical defect, likely secondary to attenuation artifact though unable to exclude small region of old infarct GI uptake artifact noted Mild global hypokinesis, EF estimated at 35% (depressed EF possibly exaggerated by GI uptake artifact) No EKG changes concerning for ischemia at peak stress or in recovery. Resting EKG with nonspecific ST abnormality CT attenuation corrected images with significant three-vessel coronary calcification, mild aortic atherosclerosis Low risk scan.   __________   2D echo 06/16/2019: 1. The left ventricle has low normal systolic function, with an ejection  fraction of 50-55%. The cavity size was normal. There is mildly increased  left ventricular wall thickness. Left ventricular diastolic parameters  were normal.   2. The right ventricle has normal systolic function. The cavity was  normal. There is no increase in right ventricular wall thickness.   3. The aortic valve is tricuspid. Aortic valve regurgitation was not  assessed by color flow Doppler.     EKG:  EKG is ordered today.  The EKG ordered today demonstrates NSR with sinus arrhythmia, 93 bpm, incomplete LBBB, no significant change when compared to prior tracing  Recent Labs: 05/08/2021: ALT 16 05/19/2021: Magnesium 1.9 08/29/2021: BUN 60;  Creatinine, Ser 11.00; Hemoglobin 11.2; Platelets 216; Potassium 5.8; Sodium 135  Recent Lipid Panel    Component Value Date/Time   CHOL 100 05/09/2021 0453   CHOL 116 04/21/2019 1334   TRIG 117 05/09/2021 0453   HDL 24 (L) 05/09/2021 0453   HDL 37 (L) 04/21/2019 1334   CHOLHDL 4.2 05/09/2021 0453   VLDL 23 05/09/2021 0453   LDLCALC 53 05/09/2021 0453   LDLCALC 63 04/21/2019 1334    PHYSICAL EXAM:    VS:  BP 114/78 (BP Location: Left Arm, Patient Position: Sitting, Cuff Size: Large)   Pulse 93   Ht 5\' 3"  (1.6 m)   Wt 252 lb (114.3 kg)   SpO2 97% Comment: on 3L O2  BMI 44.64 kg/m   BMI: Body mass index is 44.64 kg/m.  Physical Exam Vitals reviewed.  Constitutional:      Appearance: He is well-developed.  HENT:     Head: Normocephalic and atraumatic.  Eyes:     General:        Right eye: No discharge.        Left eye: No discharge.  Neck:     Vascular: No JVD.  Cardiovascular:     Rate and Rhythm: Normal rate and regular rhythm.     Pulses:          Posterior tibial pulses are 2+ on the right side and 2+ on the left side.     Heart sounds: Normal heart sounds, S1 normal and S2 normal. Heart sounds not distant. No midsystolic click and no opening snap. No murmur heard.   No friction rub.     Comments: Right femoral cardiac cath site is well-healing with mild bruising.  No active bleeding, swelling, warmth, erythema, or tenderness to palpation.  No bruit. Pulmonary:     Effort: Pulmonary effort is normal. No respiratory distress.     Breath sounds: Normal breath sounds. No decreased breath sounds, wheezing or rales.  Chest:     Chest wall: No tenderness.  Abdominal:     General: There  is no distension.     Palpations: Abdomen is soft.     Tenderness: There is no abdominal tenderness.  Musculoskeletal:     Cervical back: Normal range of motion.     Right lower leg: No edema.     Left lower leg: No edema.  Skin:    General: Skin is warm and dry.     Nails: There  is no clubbing.  Neurological:     Mental Status: He is alert and oriented to person, place, and time.  Psychiatric:        Speech: Speech normal.        Behavior: Behavior normal.        Thought Content: Thought content normal.        Judgment: Judgment normal.    Wt Readings from Last 3 Encounters:  09/05/21 252 lb (114.3 kg)  08/28/21 253 lb (114.8 kg)  08/16/21 246 lb 14.6 oz (112 kg)     ASSESSMENT & PLAN:   CAD involving the native coronary arteries without angina: He is doing well without symptoms concerning for angina.  Continue DAPT with aspirin and clopidogrel without interruption.  He will otherwise continue secondary prevention with atorvastatin, Imdur, metoprolol, and losartan.  Post-cath instructions.  Chronic combined systolic and diastolic CHF secondary to ICM: Volume status is difficult to assess on physical exam secondary to body habitus, though he does appear well compensated.  Volume management per hemodialysis.  He does remain on furosemide on non-hemodialysis days.  History of relative hypotension precludes escalation of GDMT.  He will continue Toprol-XL, losartan, and Imdur.  If BP allows, could consider addition of hydralazine to isosorbide.  We will plan to obtain an echo in 1 month's time to reevaluate his LV systolic function following PCI and maximally tolerated GDMT.  If his EF has normalized following the above, LifeVest can be discontinued at that time and ICD placement deferred.  If however, his cardiomyopathy persist he will need to remain on a LifeVest and will need to have a further discussion regarding ICD with EP.  History of VF arrest: Wearing LifeVest without alarms, gongs, or shocks.  Plan to update echo in 1 month as outlined above.  Follow-up with EP as directed.  With regards to the bill he received for his LifeVest, I have advised him to contact the company and his insurance for further recommendations.  We can fill out any paperwork needed providing  our recommendation for continued use of the device.  ESRD: HD per nephrology.  HTN: Blood pressure is well controlled in the office.  Continue current medications as outlined above.  HLD: LDL 53 in 05/2021.  He remains on atorvastatin 40 mg.  Morbid obesity with OSA and OHS: Weight loss is encouraged to heart healthy diet and regular exercise.  Continued adherence to CPAP is recommended.  This was not discussed in detail at today's visit.  Chronic hypoxic respiratory failure: Stable.  He remains on supplemental oxygen.  Follow-up with PCP as directed.  Hyperkalemia: He has undergone dialysis since.  Follow-up with nephrology as directed.  Attempt to get updated labs.  Constipation with associated BRBPR: He does note blood on the outside surface of stool with associated significant straining.  Start docusate 100 mg daily.  He declines a CBC.  Follow-up with PCP.  Disposition: F/u with Dr. Garen Lah as scheduled next month, and Dr. Quentin Ore, MD as directed.   Medication Adjustments/Labs and Tests Ordered: Current medicines are reviewed at length with the patient  today.  Concerns regarding medicines are outlined above. Medication changes, Labs and Tests ordered today are summarized above and listed in the Patient Instructions accessible in Encounters.   Signed, Christell Faith, PA-C 09/05/2021 1:34 PM     [place], [place] [address] [phone]

## 2021-09-05 ENCOUNTER — Other Ambulatory Visit: Payer: Self-pay

## 2021-09-05 ENCOUNTER — Ambulatory Visit (INDEPENDENT_AMBULATORY_CARE_PROVIDER_SITE_OTHER): Payer: Medicare Other | Admitting: Physician Assistant

## 2021-09-05 ENCOUNTER — Encounter: Payer: Self-pay | Admitting: Physician Assistant

## 2021-09-05 VITALS — BP 114/78 | HR 93 | Ht 63.0 in | Wt 252.0 lb

## 2021-09-05 DIAGNOSIS — I4901 Ventricular fibrillation: Secondary | ICD-10-CM | POA: Diagnosis not present

## 2021-09-05 DIAGNOSIS — K921 Melena: Secondary | ICD-10-CM

## 2021-09-05 DIAGNOSIS — I5042 Chronic combined systolic (congestive) and diastolic (congestive) heart failure: Secondary | ICD-10-CM | POA: Diagnosis not present

## 2021-09-05 DIAGNOSIS — Z9989 Dependence on other enabling machines and devices: Secondary | ICD-10-CM

## 2021-09-05 DIAGNOSIS — G4733 Obstructive sleep apnea (adult) (pediatric): Secondary | ICD-10-CM

## 2021-09-05 DIAGNOSIS — K5900 Constipation, unspecified: Secondary | ICD-10-CM

## 2021-09-05 DIAGNOSIS — Z992 Dependence on renal dialysis: Secondary | ICD-10-CM

## 2021-09-05 DIAGNOSIS — I2510 Atherosclerotic heart disease of native coronary artery without angina pectoris: Secondary | ICD-10-CM | POA: Diagnosis not present

## 2021-09-05 DIAGNOSIS — I255 Ischemic cardiomyopathy: Secondary | ICD-10-CM | POA: Diagnosis not present

## 2021-09-05 DIAGNOSIS — N186 End stage renal disease: Secondary | ICD-10-CM

## 2021-09-05 DIAGNOSIS — I10 Essential (primary) hypertension: Secondary | ICD-10-CM

## 2021-09-05 DIAGNOSIS — J9611 Chronic respiratory failure with hypoxia: Secondary | ICD-10-CM

## 2021-09-05 DIAGNOSIS — E785 Hyperlipidemia, unspecified: Secondary | ICD-10-CM

## 2021-09-05 MED ORDER — DOCUSATE SODIUM 100 MG PO CAPS: 100.0000 mg | ORAL_CAPSULE | Freq: Every day | ORAL | 3 refills | Status: DC

## 2021-09-05 NOTE — Patient Instructions (Signed)
Medication Instructions:  Your physician has recommended you make the following change in your medication:   START Docusate 100 mg once daily   *If you need a refill on your cardiac medications before your next appointment, please call your pharmacy*   Lab Work: None  If you have labs (blood work) drawn today and your tests are completely normal, you will receive your results only by: [place] (if you have MyChart) OR A paper copy in the mail If you have any lab test that is abnormal or we need to change your treatment, we will call you to review the results.   Testing/Procedures: Your physician has requested that you have an echocardiogram in [place]. DO NOT schedule this to be done before October 10/05/21.   Echocardiography is a painless test that uses sound waves to create images of your heart. It provides your doctor with information about the size and shape of your heart and how well your heart's chambers and valves are working. This procedure takes approximately one hour. There are no restrictions for this procedure.    Follow-Up: At [facility], you and your health needs are our priority.  As part of our continuing mission to provide you with exceptional heart care, we have created designated Provider Care Teams.  These Care Teams include your primary Cardiologist (physician) and Advanced Practice Providers (APPs -  Physician Assistants and Nurse Practitioners) who all work together to provide you with the care you need, when you need it.   Your next appointment:   Keep upcoming appointment with Dr. Garen Lah   The format for your next appointment:   In Person  Provider:   Kate Sable, MD

## 2021-09-08 ENCOUNTER — Other Ambulatory Visit: Payer: Self-pay | Admitting: Adult Health

## 2021-09-13 ENCOUNTER — Other Ambulatory Visit (HOSPITAL_COMMUNITY): Payer: Self-pay

## 2021-09-13 ENCOUNTER — Telehealth (HOSPITAL_COMMUNITY): Payer: Self-pay

## 2021-09-13 NOTE — Telephone Encounter (Signed)
Transitions of Care Pharmacy  ° °Call attempted for a pharmacy transitions of care follow-up. HIPAA appropriate voicemail was left with call back information provided.  ° °Call attempt #1. Will follow-up in 2-3 days.  °  °

## 2021-09-20 ENCOUNTER — Other Ambulatory Visit (HOSPITAL_COMMUNITY): Payer: Self-pay

## 2021-09-20 ENCOUNTER — Telehealth (HOSPITAL_COMMUNITY): Payer: Self-pay | Admitting: Pharmacist

## 2021-09-20 NOTE — Telephone Encounter (Signed)
Transitions of Care Pharmacy   Call attempted for a pharmacy transitions of care follow-up. HIPAA appropriate voicemail was left with call back information provided.   Call attempt #2. Will follow-up in 2-3 days.    

## 2021-09-21 ENCOUNTER — Telehealth (HOSPITAL_COMMUNITY): Payer: Self-pay

## 2021-09-21 NOTE — Telephone Encounter (Signed)
Transitions of Care Pharmacy   Call attempted for a pharmacy transitions of care follow-up. HIPAA appropriate voicemail was left with call back information provided.   Call attempt #3. Will no loner attempt follow up for [facility] pharmacy

## 2021-09-22 ENCOUNTER — Encounter: Payer: Self-pay | Admitting: Cardiology

## 2021-09-22 ENCOUNTER — Ambulatory Visit (INDEPENDENT_AMBULATORY_CARE_PROVIDER_SITE_OTHER): Payer: Medicare Other | Admitting: Cardiology

## 2021-09-22 ENCOUNTER — Other Ambulatory Visit: Payer: Self-pay

## 2021-09-22 VITALS — BP 116/70 | HR 98 | Ht 63.0 in | Wt 251.0 lb

## 2021-09-22 DIAGNOSIS — I10 Essential (primary) hypertension: Secondary | ICD-10-CM

## 2021-09-22 DIAGNOSIS — I4901 Ventricular fibrillation: Secondary | ICD-10-CM | POA: Diagnosis not present

## 2021-09-22 DIAGNOSIS — I2510 Atherosclerotic heart disease of native coronary artery without angina pectoris: Secondary | ICD-10-CM | POA: Diagnosis not present

## 2021-09-22 NOTE — Progress Notes (Signed)
Cardiology Office Note:    Date:  09/22/2021   ID:  [name], DOB [birth year], MRN [id number]  PCP:  [facility] [facility]  Cardiologist:  Kate Sable, MD  Electrophysiologist:  Vickie Epley, MD    Chief Complaint  Patient presents with   Other    Follow up post cath. Meds reviewed verbally with patient.     History of Present Illness:    [name] is a 55 y.o. male with a hx of CAD(PCI to RCA 2019, PCI to LAD and Lcx 08/2021) VF arrest 04/2021 (deemed ischemic w/ subsequent pci), hypertension, diabetes, CKD on dialysis 3 times a week, OSA, COPD,, who presents for follow-up.    Patient last seen due to V. fib arrest.  Left heart cath was scheduled to evaluate ischemic source.  Significant stenosis was noted in the LAD and left circumflex, underwent PCI with drug-eluting stents.  Saw EP for ICD consideration, ICD was held off as etiology for VF was likely ischemic and treated with PCI.  He is doing otherwise okay, feels a little tired today due to having hemodialysis earlier.    Prior notes Echo 04/2021 EF 50 to 55% LHC 07/2021 80% LAD, 90% Lcx , patent mid RCA stent s/p pci in 08/2021 with DES to LAD and Lcx. echo on 09/2019 showed mildly reduced EF 45 to 50%,  Lexiscan 08/2019 did not show any evidence for ischemia.    Past Medical History:  Diagnosis Date   Anemia of chronic disease    Bell's palsy    Cataract    Chronic combined systolic (congestive) and diastolic (congestive) heart failure (HCC)    Chronic respiratory failure with hypoxia (HCC)    3 lpm continuous   COPD (chronic obstructive pulmonary disease) (HCC)    Coronary artery disease    Diabetes mellitus without complication (HCC)    Dyspnea    Wheezing   ESRD on hemodialysis (HCC)    GERD (gastroesophageal reflux disease)    Gout    HOH (hard of hearing)    mild   Hypercholesterolemia    Hypertension    NSVT (nonsustained ventricular tachycardia)    Obstructive sleep apnea    CPAP,  O2 use continuosly at 3LPM   Orthopnea    Pneumonia    In Past X2 most recent 1 yr ago   Psoriasis    Pulmonary HTN ([place])    Ventricular fibrillation [facility])     Past Surgical History:  Procedure Laterality Date   A/V FISTULAGRAM Right 05/02/2021   Procedure: A/V FISTULAGRAM;  Surgeon: Katha Cabal, MD;  Location: [place] [facility];  Service: Cardiovascular;  Laterality: Right;   AV FISTULA PLACEMENT Right 11/20/2019   Procedure: ARTERIOVENOUS (AV) FISTULA CREATION ( BRACHIAL CEPHALIC );  Surgeon: Katha Cabal, MD;  Location: [facility] ORS;  Service: Vascular;  Laterality: Right;   CATARACT EXTRACTION W/PHACO Left 12/17/2019   Procedure: CATARACT EXTRACTION PHACO AND INTRAOCULAR LENS PLACEMENT ([place]) LEFT ISTENT INJ DIABETIC;  Surgeon: Eulogio Bear, MD;  Location: [facility] ORS;  Service: Ophthalmology;  Laterality: Left;  [place] 00:33.2 CDE 2.96 Fluid Pack Lot # [id number]   CATARACT EXTRACTION W/PHACO Right 02/24/2020   Procedure: CATARACT EXTRACTION PHACO AND INTRAOCULAR LENS PLACEMENT (IOC) RIGHT DIABETIC ISTENT INJ;  Surgeon: Eulogio Bear, MD;  Location: [facility] ORS;  Service: Ophthalmology;  Laterality: Right;  [place] 00:37.3 CDE 2.88 Fluid Pack Lot # [id number]   CORONARY ANGIOPLASTY WITH STENT PLACEMENT     stent  placement   CORONARY STENT INTERVENTION N/A 08/28/2021   Procedure: CORONARY STENT INTERVENTION;  Surgeon: Nelva Bush, MD;  Location: [place] [facility];  Service: Cardiovascular;  Laterality: N/A;   DIALYSIS/PERMA CATHETER INSERTION N/A 06/22/2019   Procedure: DIALYSIS/PERMA CATHETER INSERTION;  Surgeon: Algernon Huxley, MD;  Location: [place] [facility];  Service: Cardiovascular;  Laterality: N/A;   DIALYSIS/PERMA CATHETER REMOVAL N/A 05/31/2020   Procedure: DIALYSIS/PERMA CATHETER REMOVAL;  Surgeon: Katha Cabal, MD;  Location: [place] [facility];  Service: Cardiovascular;  Laterality: N/A;   INSERTION OF AHMED VALVE Left 12/17/2019   Procedure: INSERTION OF  iSTENT;  Surgeon: Eulogio Bear, MD;  Location: [facility] OR[facility];  Service: Ophthalmology;  Laterality: Left;   INTRAVASCULAR IMAGING/OCT N/A 08/28/2021   Procedure: INTRAVASCULAR IMAGING/OCT;  Surgeon: Nelva Bush, MD;  Location: [place] [facility];  Service: Cardiovascular;  Laterality: N/A;   LEFT HEART CATH AND CORONARY ANGIOGRAPHY N/A 08/08/2021   Procedure: LEFT HEART CATH AND CORONARY ANGIOGRAPHY;  Surgeon: Nelva Bush, MD;  Location: [place] [facility];  Service: Cardiovascular;  Laterality: N/A;    Current Medications: Current Meds  Medication Sig   albuterol (PROVENTIL HFA;VENTOLIN HFA) 108 (90 Base) MCG/ACT inhaler Inhale 2 puffs into the lungs every 4 (four) hours as needed for wheezing or shortness of breath.   allopurinol (ZYLOPRIM) 100 MG tablet Take 100 mg by mouth daily.   ANORO ELLIPTA 62.5-25 MCG/INH AEPB INHALE 1 PUFF INTO THE LUNGS DAILY.   aspirin EC 81 MG tablet Take 81 mg by mouth in the morning. Swallow whole.   calcium acetate (PHOSLO) 667 MG capsule Take 667 mg by mouth 3 (three) times daily with meals.   Cholecalciferol (D3-1000) 25 MCG (1000 UT) tablet Take 2 tablets (2,000 Units total) by mouth daily.   clopidogrel (PLAVIX) 75 MG tablet Take 1 tablet (75 mg total) by mouth daily with breakfast.   cyclobenzaprine (FLEXERIL) 5 MG tablet Take 1 tablet (5 mg total) by mouth 2 (two) times daily as needed for muscle spasms.   docusate sodium (COLACE) 100 MG capsule Take 1 capsule (100 mg total) by mouth daily.   furosemide (LASIX) 80 MG tablet Take 1 tablet (80 mg total) by mouth See admin instructions. Pt only takes when he doesn't have Dialysis days. Tues and Thurs Sat and Sun   gabapentin (NEURONTIN) 300 MG capsule Take 300 mg by mouth at bedtime.   glimepiride (AMARYL) 4 MG tablet Take 4 mg by mouth daily with breakfast.   insulin degludec (TRESIBA FLEXTOUCH) 100 UNIT/ML FlexTouch Pen Inject 20 Units into the skin at bedtime.   isosorbide mononitrate (IMDUR)  60 MG 24 hr tablet Take 60 mg by mouth daily.   losartan (COZAAR) 50 MG tablet Take 50 mg by mouth daily.   metoprolol succinate (TOPROL-XL) 25 MG 24 hr tablet Take 25 mg by mouth daily.   OXYGEN Inhale 3 L into the lungs daily.   pantoprazole (PROTONIX) 40 MG tablet Take 1 tablet (40 mg total) by mouth daily.   Risankizumab-rzaa (SKYRIZI) 150 MG/ML SOSY Inject 150 mg into the skin as directed. Every 12 weeks for maintenance.   triamcinolone (KENALOG) 0.025 % cream Apply 1 application topically daily in the afternoon.   TRULICITY 3 IE/3.3IR SOPN Inject 3 mg into the skin every Thursday.     Allergies:   Shellfish allergy, Betadine [povidone iodine], and Povidone-iodine   Social History   Socioeconomic History   Marital status: Single    Spouse name:  Not on file   Number of children: Not on file   Years of education: Not on file   Highest education level: Not on file  Occupational History    Comment: disabled  Tobacco Use   Smoking status: Never   Smokeless tobacco: Never  Vaping Use   Vaping Use: Never used  Substance and Sexual Activity   Alcohol use: Yes    Alcohol/week: 1.0 standard drink    Types: 1 Cans of beer per week    Comment: rare occassion   Drug use: Not Currently    Types: Marijuana    Comment: in high school    Sexual activity: Not on file  Other Topics Concern   Not on file  Social History Narrative   Patient uses continuous oxygen at 3L/min   Dialysis, AV fistula in L arm   Lives at home by himself ; friend gives pt. Transportation    Social Determinants of Health   Financial Resource Strain: Not on file  Food Insecurity: Not on file  Transportation Needs: Not on file  Physical Activity: Not on file  Stress: Not on file  Social Connections: Not on file     Family History: The patient's family history includes Heart failure in his mother; Kidney failure in his brother.  ROS:   Please see the history of present illness.     All other systems  reviewed and are negative.  EKGs/Labs/Other Studies Reviewed:    The following studies were reviewed today:   EKG:  EKG is  ordered today.  The ekg ordered today demonstrates normal sinus rhythm   Recent Labs: 05/08/2021: ALT 16 05/19/2021: Magnesium 1.9 08/29/2021: BUN 60; Creatinine, Ser 11.00; Hemoglobin 11.2; Platelets 216; Potassium 5.8; Sodium 135  Recent Lipid Panel    Component Value Date/Time   CHOL 100 05/09/2021 0453   CHOL 116 04/21/2019 1334   TRIG 117 05/09/2021 0453   HDL 24 (L) 05/09/2021 0453   HDL 37 (L) 04/21/2019 1334   CHOLHDL 4.2 05/09/2021 0453   VLDL 23 05/09/2021 0453   LDLCALC 53 05/09/2021 0453   LDLCALC 63 04/21/2019 1334    Physical Exam:    VS:  BP 116/70 (BP Location: Left Arm, Patient Position: Sitting, Cuff Size: Large)   Pulse 98   Ht 5\' 3"  (1.6 m)   Wt 251 lb (113.9 kg)   SpO2 97%   BMI 44.46 kg/m     Wt Readings from Last 3 Encounters:  09/22/21 251 lb (113.9 kg)  09/05/21 252 lb (114.3 kg)  08/28/21 253 lb (114.8 kg)     GEN:  Well nourished, well developed in no acute distress, nasal canula HEENT: Normal NECK: No JVD; No carotid bruits LYMPHATICS: No lymphadenopathy CARDIAC: RRR, no murmurs, rubs, gallops RESPIRATORY:  Clear to auscultation without rales, wheezing or rhonchi, decreased breath sounds at bases. ABDOMEN: Soft, non-tender, distended MUSCULOSKELETAL:  No edema; No deformity  SKIN: Warm and dry NEUROLOGIC:  Alert and oriented x 3 PSYCHIATRIC:  Normal affect   ASSESSMENT:    1. Coronary artery disease involving native coronary artery of native heart without angina pectoris   2. Ventricular fibrillation ([place])   3. Primary hypertension    PLAN:     CAD (PCI to RCA 2019, PCI to LAD and LCx 08/2021).  Denies chest pain.  Continue aspirin, Plavix, Lipitor.  LDL at goal. hx ventricular fibrillation arrest.  Likely from ischemia, s/p PCI to LAD and left circumflex.  Likely may not need  ICD.  Keep follow-up  appointment with EP regarding ICD consideration.  Appreciate input from EP.  Avoiding beta-blockers due to sinus bradycardia in the hospital. Hypertension, BP controlled.  Continue losartan 50 mg daily, Imdur 60 mg daily.  Follow-up in 6 months   Medication Adjustments/Labs and Tests Ordered: Current medicines are reviewed at length with the patient today.  Concerns regarding medicines are outlined above.  Orders Placed This Encounter  Procedures   EKG 12-Lead     No orders of the defined types were placed in this encounter.   Patient Instructions  Medication Instructions:  Your physician recommends that you continue on your current medications as directed. Please refer to the Current Medication list given to you today.  *If you need a refill on your cardiac medications before your next appointment, please call your pharmacy*   Lab Work: None ordered If you have labs (blood work) drawn today and your tests are completely normal, you will receive your results only by: [place] (if you have MyChart) OR A paper copy in the mail If you have any lab test that is abnormal or we need to change your treatment, we will call you to review the results.   Testing/Procedures: None ordered   Follow-Up: At [facility], you and your health needs are our priority.  As part of our continuing mission to provide you with exceptional heart care, we have created designated Provider Care Teams.  These Care Teams include your primary Cardiologist (physician) and Advanced Practice Providers (APPs -  Physician Assistants and Nurse Practitioners) who all work together to provide you with the care you need, when you need it.  We recommend signing up for the patient portal called "MyChart".  Sign up information is provided on this After Visit Summary.  MyChart is used to connect with patients for Virtual Visits (Telemedicine).  Patients are able to view lab/test results, encounter notes, upcoming  appointments, etc.  Non-urgent messages can be sent to your provider as well.   To learn more about what you can do with MyChart, go to [email or web address].    Your next appointment:   6 month(s)  The format for your next appointment:   In Person  Provider:   Kate Sable, MD   Other Instructions   Jannifer Hick, MD Address: [address], [place], [place] [address] Phone: [phone]  Suann Larry, MD Address: [address] [place], [place] [address] Phone: [phone]     Signed, Kate Sable, MD  09/22/2021 5:11 PM    [place]

## 2021-09-22 NOTE — Patient Instructions (Signed)
Medication Instructions:  Your physician recommends that you continue on your current medications as directed. Please refer to the Current Medication list given to you today.  *If you need a refill on your cardiac medications before your next appointment, please call your pharmacy*   Lab Work: None ordered If you have labs (blood work) drawn today and your tests are completely normal, you will receive your results only by: [place] (if you have MyChart) OR A paper copy in the mail If you have any lab test that is abnormal or we need to change your treatment, we will call you to review the results.   Testing/Procedures: None ordered   Follow-Up: At [facility], you and your health needs are our priority.  As part of our continuing mission to provide you with exceptional heart care, we have created designated Provider Care Teams.  These Care Teams include your primary Cardiologist (physician) and Advanced Practice Providers (APPs -  Physician Assistants and Nurse Practitioners) who all work together to provide you with the care you need, when you need it.  We recommend signing up for the patient portal called "MyChart".  Sign up information is provided on this After Visit Summary.  MyChart is used to connect with patients for Virtual Visits (Telemedicine).  Patients are able to view lab/test results, encounter notes, upcoming appointments, etc.  Non-urgent messages can be sent to your provider as well.   To learn more about what you can do with MyChart, go to [email or web address].    Your next appointment:   6 month(s)  The format for your next appointment:   In Person  Provider:   Kate Sable, MD   Other Instructions   Jannifer Hick, MD Address: [address] [place], [place] [address] Phone: [phone]  Suann Larry, MD Address: [address], [place], [place] [address] Phone: [phone]

## 2021-10-05 ENCOUNTER — Other Ambulatory Visit: Payer: Self-pay

## 2021-10-05 ENCOUNTER — Ambulatory Visit (INDEPENDENT_AMBULATORY_CARE_PROVIDER_SITE_OTHER): Payer: Medicare Other | Admitting: Podiatry

## 2021-10-05 ENCOUNTER — Encounter: Payer: Self-pay | Admitting: Podiatry

## 2021-10-05 DIAGNOSIS — I872 Venous insufficiency (chronic) (peripheral): Secondary | ICD-10-CM | POA: Diagnosis not present

## 2021-10-05 DIAGNOSIS — E1142 Type 2 diabetes mellitus with diabetic polyneuropathy: Secondary | ICD-10-CM

## 2021-10-05 DIAGNOSIS — N186 End stage renal disease: Secondary | ICD-10-CM | POA: Diagnosis not present

## 2021-10-05 DIAGNOSIS — I5020 Unspecified systolic (congestive) heart failure: Secondary | ICD-10-CM

## 2021-10-05 DIAGNOSIS — B351 Tinea unguium: Secondary | ICD-10-CM | POA: Diagnosis not present

## 2021-10-05 DIAGNOSIS — M79676 Pain in unspecified toe(s): Secondary | ICD-10-CM

## 2021-10-05 NOTE — Progress Notes (Signed)
This patient returns to my office for at risk foot care.  This patient requires this care by a professional since this patient will be at risk due to having diabetes mellitus and ESRD.  This patient is unable to cut nails himself since the patient cannot reach his nails.These nails are painful walking and wearing shoes.  This patient presents for at risk foot care today. ° °General Appearance  Alert, conversant and in no acute stress. ° °Vascular  Dorsalis pedis and posterior tibial  pulses are weakly  palpable due to swelling. bilaterally.  Capillary return is within normal limits  bilaterally. Temperature is within normal limits  Bilaterally.  Venous stasis  B/L. ° °Neurologic  Senn-Weinstein monofilament wire test within normal limits  bilaterally. Muscle power within normal limits bilaterally. ° °Nails Thick disfigured discolored nails with subungual debris  from hallux to fifth toes bilaterally. No evidence of bacterial infection or drainage bilaterally. ° °Orthopedic  No limitations of motion  feet .  No crepitus or effusions noted.  No bony pathology or digital deformities noted. ° °Skin  normotropic skin with no porokeratosis noted bilaterally.  No signs of infections or ulcers noted.  Punctate psoriasis plantar aspect feet  B/l.  ° °Onychomycosis  Pain in right toes  Pain in left toes ° °Consent was obtained for treatment procedures.   Mechanical debridement of nails 1-5  bilaterally performed with a nail nipper.  Filed with dremel without incident.  ° ° °Return office visit  10 weeks                   Told patient to return for periodic foot care and evaluation due to potential at risk complications. ° ° °Lauralynn Loeb DPM  °

## 2021-10-10 ENCOUNTER — Ambulatory Visit (INDEPENDENT_AMBULATORY_CARE_PROVIDER_SITE_OTHER): Payer: Medicare Other

## 2021-10-10 ENCOUNTER — Telehealth: Payer: Self-pay | Admitting: *Deleted

## 2021-10-10 ENCOUNTER — Other Ambulatory Visit: Payer: Self-pay

## 2021-10-10 DIAGNOSIS — I5020 Unspecified systolic (congestive) heart failure: Secondary | ICD-10-CM | POA: Diagnosis not present

## 2021-10-10 LAB — ECHOCARDIOGRAM COMPLETE
AR max vel: 2.08 cm2
AV Area VTI: 2.25 cm2
AV Area mean vel: 1.92 cm2
AV Mean grad: 3 mmHg
AV Peak grad: 4.3 mmHg
Ao pk vel: 1.04 m/s
Calc EF: 36.5 %
S' Lateral: 5.17 cm
Single Plane A2C EF: 37.1 %
Single Plane A4C EF: 36.7 %

## 2021-10-10 MED ORDER — PERFLUTREN LIPID MICROSPHERE
1.0000 mL | INTRAVENOUS | Status: AC | PRN
Start: 2021-10-10 — End: 2021-10-10
  Administered 2021-10-10: 2 mL via INTRAVENOUS

## 2021-10-10 NOTE — Telephone Encounter (Signed)
Left voicemail message to call back for review of results.  

## 2021-10-10 NOTE — Telephone Encounter (Signed)
-----   Message from Rise Mu, PA-C sent at 10/10/2021  3:34 PM EDT ----- Echo showed a pump function of 35-40%, dilated left ventricular cavity size, mild thickening of the left ventricle, stiffening of the heart, mildly to moderately leaky tricuspid valve.  Cardiomyopathy persists. Currently on maximally tolerated evidence-based medical therapy. Follow up with EP as directed to discuss LifeVest/ICD. Will forward to primary cardiologist as an [place].

## 2021-10-12 NOTE — Telephone Encounter (Signed)
Reviewed results and recommendations with patient and he verbalized understanding with no further questions at this time. Confirmed appointment with EP provider.

## 2021-10-13 ENCOUNTER — Telehealth: Payer: Self-pay

## 2021-10-13 MED ORDER — ENTRESTO 49-51 MG PO TABS: 1.0000 | ORAL_TABLET | Freq: Two times a day (BID) | ORAL | 5 refills | Status: DC

## 2021-10-13 NOTE — Telephone Encounter (Signed)
-----   Message from Kate Sable, MD sent at 10/12/2021  6:49 PM EDT ----- Ralene Muskrat,  Please schedule follow up appointment with myself will likely need a CMR to better assess EF. Stop losartan, start entresto 49-51mg  bid. Keep appointment with EP.  Thank you BA ----- Message ----- From: Sindy Messing Sent: 10/10/2021   3:34 PM EDT To: Valora Corporal, RN, Kate Sable, MD  Echo showed a pump function of 35-40%, dilated left ventricular cavity size, mild thickening of the left ventricle, stiffening of the heart, mildly to moderately leaky tricuspid valve.  Cardiomyopathy persists. Currently on maximally tolerated evidence-based medical therapy. Follow up with EP as directed to discuss LifeVest/ICD. Will forward to primary cardiologist as an [place].

## 2021-10-13 NOTE — Telephone Encounter (Signed)
Called patient and informed him of the below recommendations from Dr. Garen Lah. Patient is scheduled to come in on Friday 10/20/21 next week. He has his medications delivered in pill pack form and the earliest he would get the next delivery would be Monday 10/16/21. He is aware to continue the Losartan until he receives the [facility] and will then stop the Losartan and start the [facility].   Please schedule follow up appointment with myself will likely need a CMR to better assess EF. Stop losartan, start entresto 49-51mg  bid. Keep appointment with EP.

## 2021-10-20 ENCOUNTER — Ambulatory Visit: Payer: Medicare Other | Admitting: Cardiology

## 2021-10-23 ENCOUNTER — Encounter: Payer: Self-pay | Admitting: Cardiology

## 2021-10-23 NOTE — Telephone Encounter (Signed)
This encounter was created in error - please disregard.

## 2021-10-24 ENCOUNTER — Ambulatory Visit (INDEPENDENT_AMBULATORY_CARE_PROVIDER_SITE_OTHER): Payer: Medicare Other | Admitting: Cardiology

## 2021-10-24 ENCOUNTER — Encounter: Payer: Self-pay | Admitting: Cardiology

## 2021-10-24 ENCOUNTER — Other Ambulatory Visit: Payer: Self-pay

## 2021-10-24 ENCOUNTER — Telehealth: Payer: Self-pay | Admitting: Cardiology

## 2021-10-24 VITALS — BP 128/80 | HR 83 | Ht 63.0 in | Wt 264.0 lb

## 2021-10-24 DIAGNOSIS — I4901 Ventricular fibrillation: Secondary | ICD-10-CM

## 2021-10-24 DIAGNOSIS — I255 Ischemic cardiomyopathy: Secondary | ICD-10-CM | POA: Diagnosis not present

## 2021-10-24 DIAGNOSIS — Z79899 Other long term (current) drug therapy: Secondary | ICD-10-CM

## 2021-10-24 DIAGNOSIS — I10 Essential (primary) hypertension: Secondary | ICD-10-CM | POA: Diagnosis not present

## 2021-10-24 DIAGNOSIS — I2510 Atherosclerotic heart disease of native coronary artery without angina pectoris: Secondary | ICD-10-CM | POA: Diagnosis not present

## 2021-10-24 MED ORDER — TORSEMIDE 40 MG PO TABS: ORAL_TABLET | ORAL | 3 refills | Status: DC

## 2021-10-24 NOTE — Telephone Encounter (Signed)
Please call to discus PCP referrals, states the 2 he was given today are no longer taking new patients.

## 2021-10-24 NOTE — Patient Instructions (Signed)
Medication Instructions:   Your physician has recommended you make the following change in your medication:   STOP Furosemide (Lasix)  START Torsemide 80 mg TWICE daily only on days that you DO NOT have dialysis: Tuesdays, Thursdays, Saturdays, and Sundays  *If you need a refill on your cardiac medications before your next appointment, please call your pharmacy*   Lab Work:  Your physician recommends that you return to our office for lab work in: [place] do not have to be fasting for this lab  Testing/Procedures:  None ordered   Follow-Up: At [facility], you and your health needs are our priority.  As part of our continuing mission to provide you with exceptional heart care, we have created designated Provider Care Teams.  These Care Teams include your primary Cardiologist (physician) and Advanced Practice Providers (APPs -  Physician Assistants and Nurse Practitioners) who all work together to provide you with the care you need, when you need it.  We recommend signing up for the patient portal called "MyChart".  Sign up information is provided on this After Visit Summary.  MyChart is used to connect with patients for Virtual Visits (Telemedicine).  Patients are able to view lab/test results, encounter notes, upcoming appointments, etc.  Non-urgent messages can be sent to your provider as well.   To learn more about what you can do with MyChart, go to [email or web address].    Your next appointment:   6 week(s)  The format for your next appointment:   In Person  Provider:   Kate Sable, MD   Other Instructions  Jannifer Hick, MD Address: [address] [place], [place] [address] Phone: [phone]   Suann Larry, MD Address: [address] [place], [place] [address] Phone: [phone]

## 2021-10-24 NOTE — Progress Notes (Signed)
Cardiology Office Note:    Date:  10/24/2021   ID:  [name], DOB 1966-01-08, MRN [id number]  PCP:  [facility]  Cardiologist:  Kate Sable, MD  Electrophysiologist:  Vickie Epley, MD    Chief Complaint  Patient presents with   Other    Follow up post ECHO -- Patient c.o increase in weight and lack of urination and BM. Meds reviewed verbally with patient.     History of Present Illness:    [name] is a 55 y.o. male with a hx of CAD(PCI to RCA 2019, PCI to LAD and Lcx 08/2021) VF arrest 04/2021 (deemed ischemic w/ subsequent pci), hypertension, diabetes, CKD on dialysis 3 times a week, OSA, COPD, who presents for follow-up.    Patient being seen for V. fib arrest, CAD and cardiomyopathy.  Recent echocardiogram showed EF 35 to 40%.  He states not urinating as much as before, gained almost 20 pounds over the past 2 weeks.  Got dialyzed yesterday, 4 L of fluid taken out.  Currently on Lasix 80 mg on nondialysis days.    Prior notes Echo 04/2021 EF 50 to 55% LHC 07/2021 80% LAD, 90% Lcx , patent mid RCA stent s/p pci in 08/2021 with DES to LAD and Lcx. echo on 09/2019 showed mildly reduced EF 45 to 50%,  Lexiscan 08/2019 did not show any evidence for ischemia.    Past Medical History:  Diagnosis Date   Anemia of chronic disease    Bell's palsy    Cataract    Chronic combined systolic (congestive) and diastolic (congestive) heart failure (HCC)    Chronic respiratory failure with hypoxia (HCC)    3 lpm continuous   COPD (chronic obstructive pulmonary disease) (HCC)    Coronary artery disease    Diabetes mellitus without complication (HCC)    Dyspnea    Wheezing   ESRD on hemodialysis (HCC)    GERD (gastroesophageal reflux disease)    Gout    HOH (hard of hearing)    mild   Hypercholesterolemia    Hypertension    NSVT (nonsustained ventricular tachycardia)    Obstructive sleep apnea    CPAP, O2 use continuosly at 3LPM   Orthopnea     Pneumonia    In Past X2 most recent 1 yr ago   Psoriasis    Pulmonary HTN ([place])    Ventricular fibrillation [facility])     Past Surgical History:  Procedure Laterality Date   A/V FISTULAGRAM Right 05/02/2021   Procedure: A/V FISTULAGRAM;  Surgeon: Katha Cabal, MD;  Location: [facility];  Service: Cardiovascular;  Laterality: Right;   AV FISTULA PLACEMENT Right 11/20/2019   Procedure: ARTERIOVENOUS (AV) FISTULA CREATION ( BRACHIAL CEPHALIC );  Surgeon: Katha Cabal, MD;  Location: [facility];  Service: Vascular;  Laterality: Right;   CATARACT EXTRACTION W/PHACO Left 12/17/2019   Procedure: CATARACT EXTRACTION PHACO AND INTRAOCULAR LENS PLACEMENT ([place]) LEFT ISTENT INJ DIABETIC;  Surgeon: Eulogio Bear, MD;  Location: [facility];  Service: Ophthalmology;  Laterality: Left;  [place] 00:33.2 CDE 2.96 Fluid Pack Lot # [id number]   CATARACT EXTRACTION W/PHACO Right 02/24/2020   Procedure: CATARACT EXTRACTION PHACO AND INTRAOCULAR LENS PLACEMENT (IOC) RIGHT DIABETIC ISTENT INJ;  Surgeon: Eulogio Bear, MD;  Location: [facility];  Service: Ophthalmology;  Laterality: Right;  [place] 00:37.3 CDE 2.88 Fluid Pack Lot # [id number]   CORONARY ANGIOPLASTY WITH STENT PLACEMENT     stent placement   CORONARY  STENT INTERVENTION N/A 08/28/2021   Procedure: CORONARY STENT INTERVENTION;  Surgeon: Nelva Bush, MD;  Location: [facility];  Service: Cardiovascular;  Laterality: N/A;   DIALYSIS/PERMA CATHETER INSERTION N/A 06/22/2019   Procedure: DIALYSIS/PERMA CATHETER INSERTION;  Surgeon: Algernon Huxley, MD;  Location: [facility] [facility];  Service: Cardiovascular;  Laterality: N/A;   DIALYSIS/PERMA CATHETER REMOVAL N/A 05/31/2020   Procedure: DIALYSIS/PERMA CATHETER REMOVAL;  Surgeon: Katha Cabal, MD;  Location: [place] [facility];  Service: Cardiovascular;  Laterality: N/A;   INSERTION OF AHMED VALVE Left 12/17/2019   Procedure: INSERTION OF iSTENT;  Surgeon: Eulogio Bear, MD;   Location: [facility];  Service: Ophthalmology;  Laterality: Left;   INTRAVASCULAR IMAGING/OCT N/A 08/28/2021   Procedure: INTRAVASCULAR IMAGING/OCT;  Surgeon: Nelva Bush, MD;  Location: [facility] [place] [facility];  Service: Cardiovascular;  Laterality: N/A;   LEFT HEART CATH AND CORONARY ANGIOGRAPHY N/A 08/08/2021   Procedure: LEFT HEART CATH AND CORONARY ANGIOGRAPHY;  Surgeon: Nelva Bush, MD;  Location: [place] [facility];  Service: Cardiovascular;  Laterality: N/A;    Current Medications: Current Meds  Medication Sig   albuterol (PROVENTIL HFA;VENTOLIN HFA) 108 (90 Base) MCG/ACT inhaler Inhale 2 puffs into the lungs every 4 (four) hours as needed for wheezing or shortness of breath.   allopurinol (ZYLOPRIM) 100 MG tablet Take 100 mg by mouth daily.   ANORO ELLIPTA 62.5-25 MCG/INH AEPB INHALE 1 PUFF INTO THE LUNGS DAILY.   aspirin EC 81 MG tablet Take 81 mg by mouth in the morning. Swallow whole.   atorvastatin (LIPITOR) 40 MG tablet Take 1 tablet (40 mg total) by mouth at bedtime.   calcium acetate (PHOSLO) 667 MG capsule Take 667 mg by mouth 3 (three) times daily with meals.   Cholecalciferol (D3-1000) 25 MCG (1000 UT) tablet Take 2 tablets (2,000 Units total) by mouth daily.   clopidogrel (PLAVIX) 75 MG tablet Take 1 tablet (75 mg total) by mouth daily with breakfast.   cyclobenzaprine (FLEXERIL) 5 MG tablet Take 1 tablet (5 mg total) by mouth 2 (two) times daily as needed for muscle spasms.   docusate sodium (COLACE) 100 MG capsule Take 1 capsule (100 mg total) by mouth daily.   gabapentin (NEURONTIN) 300 MG capsule Take 300 mg by mouth at bedtime.   glimepiride (AMARYL) 4 MG tablet Take 4 mg by mouth daily with breakfast.   insulin degludec (TRESIBA FLEXTOUCH) 100 UNIT/ML FlexTouch Pen Inject 20 Units into the skin at bedtime.   isosorbide mononitrate (IMDUR) 60 MG 24 hr tablet Take 60 mg by mouth daily.   metoprolol succinate (TOPROL-XL) 25 MG 24 hr tablet Take 25 mg by mouth daily.    Multiple Vitamins-Minerals (TAB-A-VITE) TABS TAKE 1 TABLET BY MOUTH ONCE DAILY.   OXYGEN Inhale 3 L into the lungs daily.   pantoprazole (PROTONIX) 40 MG tablet Take 1 tablet (40 mg total) by mouth daily.   Risankizumab-rzaa (SKYRIZI) 150 MG/ML SOSY Inject 150 mg into the skin as directed. Every 12 weeks for maintenance.   sacubitril-valsartan (ENTRESTO) 49-51 MG Take 1 tablet by mouth 2 (two) times daily. STOP taking your Losartan and START taking Entresto.   torsemide 40 MG TABS Take TWO tablets (80 mg) TWICE daily only on days you DO NOT have dialysis: Tuesday, Thursday, Saturday and Sunday   triamcinolone (KENALOG) 0.025 % cream Apply 1 application topically daily in the afternoon.   TRULICITY 3 BL/3.9QZ SOPN Inject 3 mg into the skin every Thursday.   [DISCONTINUED]  furosemide (LASIX) 80 MG tablet Take 80 mg by mouth 2 (two) times daily. Pt only takes when he doesn't have Dialysis days. Tues and Thurs Sat and Sun     Allergies:   Shellfish allergy, Betadine [povidone iodine], and Povidone-iodine   Social History   Socioeconomic History   Marital status: Single    Spouse name: Not on file   Number of children: Not on file   Years of education: Not on file   Highest education level: Not on file  Occupational History    Comment: disabled  Tobacco Use   Smoking status: Never   Smokeless tobacco: Never  Vaping Use   Vaping Use: Never used  Substance and Sexual Activity   Alcohol use: Yes    Alcohol/week: 1.0 standard drink    Types: 1 Cans of beer per week    Comment: rare occassion   Drug use: Not Currently    Types: Marijuana    Comment: in high school    Sexual activity: Not on file  Other Topics Concern   Not on file  Social History Narrative   Patient uses continuous oxygen at 3L/min   Dialysis, AV fistula in L arm   Lives at home by himself ; friend gives pt. Transportation    Social Determinants of Health   Financial Resource Strain: Not on file  Food  Insecurity: Not on file  Transportation Needs: Not on file  Physical Activity: Not on file  Stress: Not on file  Social Connections: Not on file     Family History: The patient's family history includes Heart failure in his mother; Kidney failure in his brother.  ROS:   Please see the history of present illness.     All other systems reviewed and are negative.  EKGs/Labs/Other Studies Reviewed:    The following studies were reviewed today:   EKG:  EKG not ordered today.    Recent Labs: 05/08/2021: ALT 16 05/19/2021: Magnesium 1.9 08/29/2021: BUN 60; Creatinine, Ser 11.00; Hemoglobin 11.2; Platelets 216; Potassium 5.8; Sodium 135  Recent Lipid Panel    Component Value Date/Time   CHOL 100 05/09/2021 0453   CHOL 116 04/21/2019 1334   TRIG 117 05/09/2021 0453   HDL 24 (L) 05/09/2021 0453   HDL 37 (L) 04/21/2019 1334   CHOLHDL 4.2 05/09/2021 0453   VLDL 23 05/09/2021 0453   LDLCALC 53 05/09/2021 0453   LDLCALC 63 04/21/2019 1334    Physical Exam:    VS:  BP 128/80 (BP Location: Left Arm, Patient Position: Sitting, Cuff Size: Large)   Pulse 83   Ht 5\' 3"  (1.6 m)   Wt 264 lb (119.7 kg)   SpO2 95%   BMI 46.77 kg/m     Wt Readings from Last 3 Encounters:  10/24/21 264 lb (119.7 kg)  09/22/21 251 lb (113.9 kg)  09/05/21 252 lb (114.3 kg)     GEN:  Well nourished, well developed in no acute distress, nasal canula HEENT: Normal NECK: No JVD; No carotid bruits LYMPHATICS: No lymphadenopathy CARDIAC: RRR, no murmurs, rubs, gallops RESPIRATORY:  decreased breath sounds at bases. ABDOMEN: Soft, non-tender, distended MUSCULOSKELETAL:  trace edema; No deformity  SKIN: Warm and dry NEUROLOGIC:  Alert and oriented x 3 PSYCHIATRIC:  Normal affect   ASSESSMENT:    1. Coronary artery disease involving native coronary artery of native heart without angina pectoris   2. Ventricular fibrillation ([place])   3. Primary hypertension   4. Ischemic cardiomyopathy   5.  Medication  management     PLAN:     CAD (PCI to RCA 2019, PCI to LAD and LCx 08/2021).  Denies chest pain.  Continue aspirin, Plavix, Lipitor.  LDL at goal. hx ventricular fibrillation arrest.  Likely from ischemia, s/p PCI to LAD and left circumflex.  Likely may not need ICD.  Keep follow-up appointment with EP regarding ICD consideration.  Echo with EF 35 to 40%. Hypertension, BP controlled.  Continue losartan 50 mg daily, Imdur 60 mg daily. Ischemic cardiomyopathy, EF 35 to 40%, abdominal distention.  Stop Lasix, start torsemide 80 mg.  Check BMP in 1 week.  Start Entresto 49-51 mg twice daily, continue Toprol-XL 25 mg daily.  Holding off Aldactone for now due to hyperkalemia on recent BMP.  Continue volume control with hemodialysis.  CMR not possible for EF evaluation due to not being able to lay flat, requiring oxygen, claustrophobia.  Plan to repeat limited echo after up titration of CHF meds.  Follow-up in 6 weeks   Medication Adjustments/Labs and Tests Ordered: Current medicines are reviewed at length with the patient today.  Concerns regarding medicines are outlined above.  Orders Placed This Encounter  Procedures   Basic Metabolic Panel (BMET)      Meds ordered this encounter  Medications   torsemide 40 MG TABS    Sig: Take TWO tablets (80 mg) TWICE daily only on days you DO NOT have dialysis: Tuesday, Thursday, Saturday and Sunday    Dispense:  180 tablet    Refill:  3     Patient Instructions  Medication Instructions:   Your physician has recommended you make the following change in your medication:   STOP Furosemide (Lasix)  START Torsemide 80 mg TWICE daily only on days that you DO NOT have dialysis: Tuesdays, Thursdays, Saturdays, and Sundays  *If you need a refill on your cardiac medications before your next appointment, please call your pharmacy*   Lab Work:  Your physician recommends that you return to our office for lab work in: [place] do not have to be  fasting for this lab  Testing/Procedures:  None ordered   Follow-Up: At [facility], you and your health needs are our priority.  As part of our continuing mission to provide you with exceptional heart care, we have created designated Provider Care Teams.  These Care Teams include your primary Cardiologist (physician) and Advanced Practice Providers (APPs -  Physician Assistants and Nurse Practitioners) who all work together to provide you with the care you need, when you need it.  We recommend signing up for the patient portal called "MyChart".  Sign up information is provided on this After Visit Summary.  MyChart is used to connect with patients for Virtual Visits (Telemedicine).  Patients are able to view lab/test results, encounter notes, upcoming appointments, etc.  Non-urgent messages can be sent to your provider as well.   To learn more about what you can do with MyChart, go to [email or web address].    Your next appointment:   6 week(s)  The format for your next appointment:   In Person  Provider:   Kate Sable, MD   Other Instructions  Jannifer Hick, MD Address: [address], [place], [place] [address] Phone: [phone]   Suann Larry, MD Address: [address] [place], [place] [address] Phone: [phone]     Signed, Kate Sable, MD  10/24/2021 10:30 AM    [place]

## 2021-10-31 ENCOUNTER — Other Ambulatory Visit: Payer: Medicare Other

## 2021-10-31 ENCOUNTER — Telehealth: Payer: Self-pay | Admitting: Cardiology

## 2021-10-31 ENCOUNTER — Other Ambulatory Visit: Payer: Self-pay

## 2021-10-31 ENCOUNTER — Other Ambulatory Visit
Admission: RE | Admit: 2021-10-31 | Discharge: 2021-10-31 | Disposition: A | Discharge status: Home / Self Care | Payer: Medicare Other | Attending: Cardiology | Admitting: Cardiology

## 2021-10-31 DIAGNOSIS — I2510 Atherosclerotic heart disease of native coronary artery without angina pectoris: Secondary | ICD-10-CM | POA: Diagnosis present

## 2021-10-31 DIAGNOSIS — Z79899 Other long term (current) drug therapy: Secondary | ICD-10-CM

## 2021-10-31 DIAGNOSIS — I10 Essential (primary) hypertension: Secondary | ICD-10-CM

## 2021-10-31 LAB — CBC
HCT: 37.3 % — ABNORMAL LOW (ref 39.0–52.0)
Hemoglobin: 11.9 g/dL — ABNORMAL LOW (ref 13.0–17.0)
MCH: 28.7 pg (ref 26.0–34.0)
MCHC: 31.9 g/dL (ref 30.0–36.0)
MCV: 90.1 fL (ref 80.0–100.0)
Platelets: 184 10*3/uL (ref 150–400)
RBC: 4.14 MIL/uL — ABNORMAL LOW (ref 4.22–5.81)
RDW: 14.6 % (ref 11.5–15.5)
WBC: 10.3 10*3/uL (ref 4.0–10.5)
nRBC: 0 % (ref 0.0–0.2)

## 2021-10-31 NOTE — Telephone Encounter (Signed)
-----   Message from Solmon Ice, RN sent at 10/24/2021  5:00 PM EST ----- Regarding: appt with Dr. Hardin Negus,  Would you reschedule this pt's upcoming appt to be with Dr. Garen Lah? This was Dr. Thereasa Solo request that his 6 week follow up be with him only. I know it can be easy to miss at the bottom of the AVS. But it was scheduled with Christell Faith.   Thanks,  [employer]

## 2021-10-31 NOTE — Telephone Encounter (Signed)
LVM to reschedule.

## 2021-10-31 NOTE — Telephone Encounter (Signed)
Called patient and gave him the [facility] phone number to call for PCP, and also gave him the [facility] number as they are accepting new patients. Patient was grateful for the follow up.

## 2021-11-06 ENCOUNTER — Telehealth: Payer: Self-pay | Admitting: Cardiology

## 2021-11-06 NOTE — Telephone Encounter (Signed)
Prescription renewal for the patient [name] life vest received. Reviewed by the patients primary Cardiologist Dr. Mylo Red- Charlestine Night. Per Dr. Garen Lah the patients most recent echo shows an EF > 35%. No need to renew the patients life vest. Patient should f/u as planned with EP Dr. Quentin Ore on 11/22/21 to be evaluated for ICD consideration.

## 2021-11-07 ENCOUNTER — Other Ambulatory Visit: Payer: Self-pay

## 2021-11-07 ENCOUNTER — Telehealth: Payer: Self-pay

## 2021-11-07 DIAGNOSIS — I5020 Unspecified systolic (congestive) heart failure: Secondary | ICD-10-CM

## 2021-11-07 NOTE — Telephone Encounter (Signed)
Spoke with patient who states [facility]. Lab order has been corrected and sent to the [facility] laboratory.

## 2021-11-07 NOTE — Telephone Encounter (Signed)
Left message requesting to have patient return call to discuss having BMP drawn at [facility] due to an incorrect order being placed in error.

## 2021-11-07 NOTE — Telephone Encounter (Signed)
Spoke with patient who states he is going to try to get the lab work drawn tomorrow at [facility] lab. He will call back to let me know if he can arrange for transportation-if not he will get it drawn next week. I expressed my sincere apology and patient was very understanding.

## 2021-11-08 ENCOUNTER — Telehealth: Payer: Self-pay | Admitting: Cardiology

## 2021-11-08 ENCOUNTER — Other Ambulatory Visit
Admission: RE | Admit: 2021-11-08 | Discharge: 2021-11-08 | Disposition: A | Discharge status: Home / Self Care | Payer: Medicare Other | Attending: Cardiology | Admitting: Cardiology

## 2021-11-08 DIAGNOSIS — I5020 Unspecified systolic (congestive) heart failure: Secondary | ICD-10-CM | POA: Insufficient documentation

## 2021-11-08 LAB — BASIC METABOLIC PANEL
Anion gap: 11 (ref 5–15)
BUN: 41 mg/dL — ABNORMAL HIGH (ref 6–20)
CO2: 27 mmol/L (ref 22–32)
Calcium: 8.6 mg/dL — ABNORMAL LOW (ref 8.9–10.3)
Chloride: 99 mmol/L (ref 98–111)
Creatinine, Ser: 10.31 mg/dL — ABNORMAL HIGH (ref 0.61–1.24)
GFR, Estimated: 5 mL/min — ABNORMAL LOW (ref >60)
Glucose, Bld: 164 mg/dL — ABNORMAL HIGH (ref 70–99)
Potassium: 6.1 mmol/L — ABNORMAL HIGH (ref 3.5–5.1)
Sodium: 137 mmol/L (ref 135–145)

## 2021-11-08 NOTE — Telephone Encounter (Signed)
Patient returning call.

## 2021-11-08 NOTE — Telephone Encounter (Signed)
Dr. Garen Lah patient. Prelim review of the patients 11/08/21 bmp. K+ 6.1 Cr 10.31 Patient is on HD  Called the patient x3. Lmtcb. No DPR on file.

## 2021-11-08 NOTE — Telephone Encounter (Addendum)
Incoming triage call received. Patient made aware of 6.1 K+ and Cr of 10.31 lab results today. Patient sts that he had HD yesterday and is scheduled again on Fri 11/10/21.  Reviewed with Ignacia Bayley, NP. Per [place] Stabs patient is to be prescribed Kayexalate 30 g to be picked up and taken today. Patient should keep his scheduled 11/10/21 HD appt.  Patient sts that his prescriptions are delivered to him from [facility] and he has no way of picking up the prescription from a local pharmacy.  Updated Ignacia Bayley, NP.  Preferred recommendation is that the patient report to the ED for treatment of his hyperkalemia. If patient declines. He should avoid foods high in potassium and should not miss his 11/10/21 HD.  Patient made aware of the recommendation. Patient sts that his asymptomatic and  he will not go to the ED. Patient sts that he will avoid high potassium foods and be sure to keep his HD appt on Fri.  Adv the patient that if symptoms develop. Ex. CP, sob, palpitations he is to call 911. Patient verbalized understanding and voiced appreciation for the assistance.

## 2021-11-15 ENCOUNTER — Telehealth: Payer: Self-pay | Admitting: Cardiology

## 2021-11-15 DIAGNOSIS — E875 Hyperkalemia: Secondary | ICD-10-CM

## 2021-11-15 NOTE — Telephone Encounter (Signed)
Patient is requesting her potassium medication to be filled but did not have name, please assist

## 2021-11-16 NOTE — Telephone Encounter (Signed)
Called patient and he wanted to know if he is to take the medication regarding his high potassium of 6.1 that he had last week (see tele encounter from 11/08/21). I informed him that we only give it at the time of the elevated result. Patient stated that he did go to dialysis on Friday 11/25 as advised in the documentation from 11/08/21. Patient also went on Mon. 11/28, and Wed. 11/30 this week. He was not sure what day they did labs, but that they do labs at least once a week.   Patient has an appointment next week on 11/22/21 with Dr. Quentin Ore and also on 11/27/21 with Dr. Garen Lah.  Will route to Dr. Garen Lah for review.

## 2021-11-16 NOTE — Telephone Encounter (Signed)
Spoke with Dr. Garen Lah and he advised that the patient get a BMP drawn asap. If his K+ is still elevated we will defer to nephrology for management. I called patient and informed him of this and he stated he would go to the [facility] tomorrow after dialysis. Patient needs ultrasound guidance with his venous punctures.   Patient stated that he was grateful for the follow up.

## 2021-11-17 ENCOUNTER — Other Ambulatory Visit
Admission: RE | Admit: 2021-11-17 | Discharge: 2021-11-17 | Disposition: A | Discharge status: Home / Self Care | Payer: Medicare Other | Attending: Cardiology | Admitting: Cardiology

## 2021-11-17 DIAGNOSIS — E875 Hyperkalemia: Secondary | ICD-10-CM | POA: Diagnosis present

## 2021-11-17 LAB — BASIC METABOLIC PANEL
Anion gap: 10 (ref 5–15)
BUN: 27 mg/dL — ABNORMAL HIGH (ref 6–20)
CO2: 30 mmol/L (ref 22–32)
Calcium: 8.7 mg/dL — ABNORMAL LOW (ref 8.9–10.3)
Chloride: 96 mmol/L — ABNORMAL LOW (ref 98–111)
Creatinine, Ser: 7.2 mg/dL — ABNORMAL HIGH (ref 0.61–1.24)
GFR, Estimated: 8 mL/min — ABNORMAL LOW (ref >60)
Glucose, Bld: 202 mg/dL — ABNORMAL HIGH (ref 70–99)
Potassium: 4.6 mmol/L (ref 3.5–5.1)
Sodium: 136 mmol/L (ref 135–145)

## 2021-11-21 ENCOUNTER — Telehealth: Payer: Self-pay | Admitting: Cardiology

## 2021-11-21 NOTE — Progress Notes (Deleted)
Electrophysiology Office Follow up Visit Note:    Date:  11/21/2021   ID:  [name], DOB 04-16-1966, MRN [id number]  PCP:  [facility]  [facility] Cardiologist:  Kate Sable, MD  [facility] Electrophysiologist:  Vickie Epley, MD    Interval History:    [name] is a 55 y.o. male who presents for a follow up visit.  I last saw him August 16, 2021 via telemedicine.  He has a history of a VF arrest in the setting of severe multivessel coronary artery disease.  PCI was planned at the time of our last visit.  He was wearing a LifeVest at the time of our last visit.  PCI was performed August 28, 2021.  He saw Dr. Garen Lah on September 22, 2021 in follow-up.  He also saw him October 24, 2021.  Recent echo 35 to 40%.       Past Medical History:  Diagnosis Date   Anemia of chronic disease    Bell's palsy    Cataract    Chronic combined systolic (congestive) and diastolic (congestive) heart failure (HCC)    Chronic respiratory failure with hypoxia (HCC)    3 lpm continuous   COPD (chronic obstructive pulmonary disease) (HCC)    Coronary artery disease    Diabetes mellitus without complication (HCC)    Dyspnea    Wheezing   ESRD on hemodialysis (HCC)    GERD (gastroesophageal reflux disease)    Gout    HOH (hard of hearing)    mild   Hypercholesterolemia    Hypertension    NSVT (nonsustained ventricular tachycardia)    Obstructive sleep apnea    CPAP, O2 use continuosly at 3LPM   Orthopnea    Pneumonia    In Past X2 most recent 1 yr ago   Psoriasis    Pulmonary HTN ([place])    Ventricular fibrillation [facility])     Past Surgical History:  Procedure Laterality Date   A/V FISTULAGRAM Right 05/02/2021   Procedure: A/V FISTULAGRAM;  Surgeon: Katha Cabal, MD;  Location: [place] [facility];  Service: Cardiovascular;  Laterality: Right;   AV FISTULA PLACEMENT Right 11/20/2019   Procedure: ARTERIOVENOUS (AV) FISTULA CREATION (  BRACHIAL CEPHALIC );  Surgeon: Katha Cabal, MD;  Location: [facility] ORS;  Service: Vascular;  Laterality: Right;   CATARACT EXTRACTION W/PHACO Left 12/17/2019   Procedure: CATARACT EXTRACTION PHACO AND INTRAOCULAR LENS PLACEMENT ([place]) LEFT ISTENT INJ DIABETIC;  Surgeon: Eulogio Bear, MD;  Location: [facility] ORS;  Service: Ophthalmology;  Laterality: Left;  [place] 00:33.2 CDE 2.96 Fluid Pack Lot # [id number] H   CATARACT EXTRACTION W/PHACO Right 02/24/2020   Procedure: CATARACT EXTRACTION PHACO AND INTRAOCULAR LENS PLACEMENT (IOC) RIGHT DIABETIC ISTENT INJ;  Surgeon: Eulogio Bear, MD;  Location: [facility] ORS;  Service: Ophthalmology;  Laterality: Right;  [place] 00:37.3 CDE 2.88 Fluid Pack Lot # [id number] H   CORONARY ANGIOPLASTY WITH STENT PLACEMENT     stent placement   CORONARY STENT INTERVENTION N/A 08/28/2021   Procedure: CORONARY STENT INTERVENTION;  Surgeon: Nelva Bush, MD;  Location: [place] [facility];  Service: Cardiovascular;  Laterality: N/A;   DIALYSIS/PERMA CATHETER INSERTION N/A 06/22/2019   Procedure: DIALYSIS/PERMA CATHETER INSERTION;  Surgeon: Algernon Huxley, MD;  Location: [place] [facility];  Service: Cardiovascular;  Laterality: N/A;   DIALYSIS/PERMA CATHETER REMOVAL N/A 05/31/2020   Procedure: DIALYSIS/PERMA CATHETER REMOVAL;  Surgeon: Katha Cabal, MD;  Location: [place] [facility] LAB;  Service:  Cardiovascular;  Laterality: N/A;   INSERTION OF AHMED VALVE Left 12/17/2019   Procedure: INSERTION OF iSTENT;  Surgeon: Eulogio Bear, MD;  Location: [facility] ORS;  Service: Ophthalmology;  Laterality: Left;   INTRAVASCULAR IMAGING/OCT N/A 08/28/2021   Procedure: INTRAVASCULAR IMAGING/OCT;  Surgeon: Nelva Bush, MD;  Location: [place] [facility];  Service: Cardiovascular;  Laterality: N/A;   LEFT HEART CATH AND CORONARY ANGIOGRAPHY N/A 08/08/2021   Procedure: LEFT HEART CATH AND CORONARY ANGIOGRAPHY;  Surgeon: Nelva Bush, MD;  Location: [place] [facility];  Service:  Cardiovascular;  Laterality: N/A;    Current Medications: No outpatient medications have been marked as taking for the 11/22/21 encounter (Appointment) with Vickie Epley, MD.     Allergies:   Shellfish allergy, Betadine [povidone iodine], and Povidone-iodine   Social History   Socioeconomic History   Marital status: Single    Spouse name: Not on file   Number of children: Not on file   Years of education: Not on file   Highest education level: Not on file  Occupational History    Comment: disabled  Tobacco Use   Smoking status: Never   Smokeless tobacco: Never  Vaping Use   Vaping Use: Never used  Substance and Sexual Activity   Alcohol use: Yes    Alcohol/week: 1.0 standard drink    Types: 1 Cans of beer per week    Comment: rare occassion   Drug use: Not Currently    Types: Marijuana    Comment: in high school    Sexual activity: Not on file  Other Topics Concern   Not on file  Social History Narrative   Patient uses continuous oxygen at 3L/min   Dialysis, AV fistula in L arm   Lives at home by himself ; friend gives pt. Transportation    Social Determinants of Health   Financial Resource Strain: Not on file  Food Insecurity: Not on file  Transportation Needs: Not on file  Physical Activity: Not on file  Stress: Not on file  Social Connections: Not on file     Family History: The patient's family history includes Heart failure in his mother; Kidney failure in his brother.  ROS:   Please see the history of present illness.    All other systems reviewed and are negative.  EKGs/Labs/Other Studies Reviewed:    The following studies were reviewed today:  October 10, 2021 echo EF 40% Globally decreased function Moderately dilated left ventricle Mild LVH RV function normal Mild to moderate MR  September 22, 2021 EKG shows sinus rhythm.  Narrow QRS.  EKG:  The ekg ordered today demonstrates ***  Recent Labs: 05/08/2021: ALT 16 05/19/2021: Magnesium  1.9 10/31/2021: Hemoglobin 11.9; Platelets 184 11/17/2021: BUN 27; Creatinine, Ser 7.20; Potassium 4.6; Sodium 136  Recent Lipid Panel    Component Value Date/Time   CHOL 100 05/09/2021 0453   CHOL 116 04/21/2019 1334   TRIG 117 05/09/2021 0453   HDL 24 (L) 05/09/2021 0453   HDL 37 (L) 04/21/2019 1334   CHOLHDL 4.2 05/09/2021 0453   VLDL 23 05/09/2021 0453   LDLCALC 53 05/09/2021 0453   LDLCALC 63 04/21/2019 1334    Physical Exam:    VS:  There were no vitals taken for this visit.    Wt Readings from Last 3 Encounters:  10/24/21 264 lb (119.7 kg)  09/22/21 251 lb (113.9 kg)  09/05/21 252 lb (114.3 kg)     GEN: *** Well nourished, well developed in  no acute distress HEENT: Normal NECK: No JVD; No carotid bruits LYMPHATICS: No lymphadenopathy CARDIAC: ***RRR, no murmurs, rubs, gallops RESPIRATORY:  Clear to auscultation without rales, wheezing or rhonchi  ABDOMEN: Soft, non-tender, non-distended MUSCULOSKELETAL:  No edema; No deformity  SKIN: Warm and dry NEUROLOGIC:  Alert and oriented x 3 PSYCHIATRIC:  Normal affect        ASSESSMENT:    No diagnosis found. PLAN:    In order of problems listed above:   Continue gdmt No I cd        Total time spent with patient today *** minutes. This includes reviewing records, evaluating the patient and coordinating care.   Medication Adjustments/Labs and Tests Ordered: Current medicines are reviewed at length with the patient today.  Concerns regarding medicines are outlined above.  No orders of the defined types were placed in this encounter.  No orders of the defined types were placed in this encounter.    Signed, Lars Mage, MD, [facility], [facility] 11/21/2021 7:08 PM    Electrophysiology [facility]

## 2021-11-21 NOTE — Telephone Encounter (Signed)
Patient calling States that he has dialysis on MWF and would not be able to come to an appt to see Dr Quentin Ore Would like to discuss with nurse

## 2021-11-22 ENCOUNTER — Ambulatory Visit: Payer: Medicare Other | Admitting: Cardiology

## 2021-11-22 DIAGNOSIS — I10 Essential (primary) hypertension: Secondary | ICD-10-CM

## 2021-11-22 DIAGNOSIS — I4901 Ventricular fibrillation: Secondary | ICD-10-CM

## 2021-11-22 DIAGNOSIS — N186 End stage renal disease: Secondary | ICD-10-CM

## 2021-11-22 DIAGNOSIS — I5042 Chronic combined systolic (congestive) and diastolic (congestive) heart failure: Secondary | ICD-10-CM

## 2021-11-23 ENCOUNTER — Other Ambulatory Visit: Payer: Self-pay | Admitting: Cardiology

## 2021-11-23 NOTE — Telephone Encounter (Signed)
*  STAT* If patient is at the pharmacy, call can be transferred to refill team.   1. Which medications need to be refilled? (please list name of each medication and dose if known)  clopidogrel 75 MG 1 tablet daily with breakfast  Pantoprazole 40 MG 1 tablet daily   2. Which pharmacy/location (including street and city if local pharmacy) is medication to be sent to?[facility] in [place]   3. Do they need a 30 day or 90 day supply? 90 day

## 2021-11-27 ENCOUNTER — Ambulatory Visit (INDEPENDENT_AMBULATORY_CARE_PROVIDER_SITE_OTHER): Payer: Medicare Other | Admitting: Cardiology

## 2021-11-27 ENCOUNTER — Encounter: Payer: Self-pay | Admitting: Cardiology

## 2021-11-27 ENCOUNTER — Other Ambulatory Visit: Payer: Self-pay

## 2021-11-27 VITALS — BP 120/72 | HR 89 | Ht 63.0 in | Wt 263.0 lb

## 2021-11-27 DIAGNOSIS — I2510 Atherosclerotic heart disease of native coronary artery without angina pectoris: Secondary | ICD-10-CM | POA: Diagnosis not present

## 2021-11-27 DIAGNOSIS — I4901 Ventricular fibrillation: Secondary | ICD-10-CM

## 2021-11-27 DIAGNOSIS — R339 Retention of urine, unspecified: Secondary | ICD-10-CM

## 2021-11-27 DIAGNOSIS — I255 Ischemic cardiomyopathy: Secondary | ICD-10-CM

## 2021-11-27 DIAGNOSIS — I10 Essential (primary) hypertension: Secondary | ICD-10-CM | POA: Diagnosis not present

## 2021-11-27 MED ORDER — CLOPIDOGREL BISULFATE 75 MG PO TABS: 75.0000 mg | ORAL_TABLET | Freq: Every day | ORAL | 0 refills | Status: DC

## 2021-11-27 MED ORDER — PANTOPRAZOLE SODIUM 40 MG PO TBEC: 40.0000 mg | DELAYED_RELEASE_TABLET | Freq: Every day | ORAL | 0 refills | Status: DC

## 2021-11-27 MED ORDER — ISOSORBIDE MONONITRATE ER 30 MG PO TB24
30.0000 mg | ORAL_TABLET | Freq: Every day | ORAL | 3 refills | Status: DC
Start: 2021-11-27 — End: 2022-01-30

## 2021-11-27 NOTE — Telephone Encounter (Signed)
Refills were sent in by [facility], RN.

## 2021-11-27 NOTE — Patient Instructions (Signed)
Medication Instructions:  Your physician has recommended you make the following change in your medication:   DECREASE your Imdur to 30 mg daily. A new prescription has been sent to your pharmacy.   *If you need a refill on your cardiac medications before your next appointment, please call your pharmacy*   Lab Work: None ordered If you have labs (blood work) drawn today and your tests are completely normal, you will receive your results only by: [place] (if you have MyChart) OR A paper copy in the mail If you have any lab test that is abnormal or we need to change your treatment, we will call you to review the results.   Testing/Procedures: None ordered   Follow-Up: At [facility], you and your health needs are our priority.  As part of our continuing mission to provide you with exceptional heart care, we have created designated Provider Care Teams.  These Care Teams include your primary Cardiologist (physician) and Advanced Practice Providers (APPs -  Physician Assistants and Nurse Practitioners) who all work together to provide you with the care you need, when you need it.  We recommend signing up for the patient portal called "MyChart".  Sign up information is provided on this After Visit Summary.  MyChart is used to connect with patients for Virtual Visits (Telemedicine).  Patients are able to view lab/test results, encounter notes, upcoming appointments, etc.  Non-urgent messages can be sent to your provider as well.   To learn more about what you can do with MyChart, go to [email or web address].    Your next appointment:   4-6 week(s)  The format for your next appointment:   In Person  Provider:  Kate Sable, MD ONLY  Other Instructions A referral has been sent to [facility]. Their office will call you to set up an appointment.

## 2021-11-27 NOTE — Progress Notes (Signed)
Cardiology Office Note:    Date:  11/27/2021   ID:  [name], DOB 03-Jan-1966, MRN [id number]  PCP:  [facility]  Cardiologist:  Kate Sable, MD  Electrophysiologist:  Vickie Epley, MD    Chief Complaint  Patient presents with   Other    6 week follow up -- Meds reviewed verbally with patient.     History of Present Illness:    [name] is a 55 y.o. male with a hx of CAD(PCI to RCA 2019, PCI to LAD and Lcx 08/2021) VF arrest 04/2021 (deemed ischemic w/ subsequent pci), hypertension, diabetes, CKD on dialysis 3 times a week, OSA, COPD, who presents for follow-up.    Patient being seen for V. fib arrest, CAD and cardiomyopathy.  Patient complains of weight gain and edema after last visit.  Lasix was stopped, torsemide started.  Entresto 49-51 mg twice daily also started due to cardiomyopathy.  Aldactone was held due to previous hyperkalemia.  Follow-up BMP with potassium within normal limits.    He states having urinary retention with dribbling when he urinates.  He sometimes stands for about 10 minutes prior to initiating urine.  Denies chest pain, feels tired today as he just came back from hemodialysis.  Prior notes Echo 09/2021 EF 35 to 40%. Echo 04/2021 EF 50 to 55% LHC 07/2021 80% LAD, 90% Lcx , patent mid RCA stent s/p pci in 08/2021 with DES to LAD and Lcx. echo on 09/2019 showed mildly reduced EF 45 to 50%,  Lexiscan 08/2019 did not show any evidence for ischemia.    Past Medical History:  Diagnosis Date   Anemia of chronic disease    Bell's palsy    Cataract    Chronic combined systolic (congestive) and diastolic (congestive) heart failure (HCC)    Chronic respiratory failure with hypoxia (HCC)    3 lpm continuous   COPD (chronic obstructive pulmonary disease) (HCC)    Coronary artery disease    Diabetes mellitus without complication (HCC)    Dyspnea    Wheezing   ESRD on hemodialysis (HCC)    GERD (gastroesophageal reflux  disease)    Gout    HOH (hard of hearing)    mild   Hypercholesterolemia    Hypertension    NSVT (nonsustained ventricular tachycardia)    Obstructive sleep apnea    CPAP, O2 use continuosly at 3LPM   Orthopnea    Pneumonia    In Past X2 most recent 1 yr ago   Psoriasis    Pulmonary HTN ([place])    Ventricular fibrillation [facility])     Past Surgical History:  Procedure Laterality Date   A/V FISTULAGRAM Right 05/02/2021   Procedure: A/V FISTULAGRAM;  Surgeon: Katha Cabal, MD;  Location: [place] [facility];  Service: Cardiovascular;  Laterality: Right;   AV FISTULA PLACEMENT Right 11/20/2019   Procedure: ARTERIOVENOUS (AV) FISTULA CREATION ( BRACHIAL CEPHALIC );  Surgeon: Katha Cabal, MD;  Location: [facility] ORS;  Service: Vascular;  Laterality: Right;   CATARACT EXTRACTION W/PHACO Left 12/17/2019   Procedure: CATARACT EXTRACTION PHACO AND INTRAOCULAR LENS PLACEMENT ([place]) LEFT ISTENT INJ DIABETIC;  Surgeon: Eulogio Bear, MD;  Location: [facility] ORS;  Service: Ophthalmology;  Laterality: Left;  [place] 00:33.2 CDE 2.96 Fluid Pack Lot # [id number] H   CATARACT EXTRACTION W/PHACO Right 02/24/2020   Procedure: CATARACT EXTRACTION PHACO AND INTRAOCULAR LENS PLACEMENT (IOC) RIGHT DIABETIC ISTENT INJ;  Surgeon: Eulogio Bear, MD;  Location: [facility] ORS;  Service: Ophthalmology;  Laterality: Right;  [place] 00:37.3 CDE 2.88 Fluid Pack Lot # [id number] H   CORONARY ANGIOPLASTY WITH STENT PLACEMENT     stent placement   CORONARY STENT INTERVENTION N/A 08/28/2021   Procedure: CORONARY STENT INTERVENTION;  Surgeon: Nelva Bush, MD;  Location: [place] [facility] LAB;  Service: Cardiovascular;  Laterality: N/A;   DIALYSIS/PERMA CATHETER INSERTION N/A 06/22/2019   Procedure: DIALYSIS/PERMA CATHETER INSERTION;  Surgeon: Algernon Huxley, MD;  Location: [place] [facility];  Service: Cardiovascular;  Laterality: N/A;   DIALYSIS/PERMA CATHETER REMOVAL N/A 05/31/2020   Procedure: DIALYSIS/PERMA CATHETER REMOVAL;   Surgeon: Katha Cabal, MD;  Location: [place] [facility] LAB;  Service: Cardiovascular;  Laterality: N/A;   INSERTION OF AHMED VALVE Left 12/17/2019   Procedure: INSERTION OF iSTENT;  Surgeon: Eulogio Bear, MD;  Location: [facility] ORS;  Service: Ophthalmology;  Laterality: Left;   INTRAVASCULAR IMAGING/OCT N/A 08/28/2021   Procedure: INTRAVASCULAR IMAGING/OCT;  Surgeon: Nelva Bush, MD;  Location: [place] [facility];  Service: Cardiovascular;  Laterality: N/A;   LEFT HEART CATH AND CORONARY ANGIOGRAPHY N/A 08/08/2021   Procedure: LEFT HEART CATH AND CORONARY ANGIOGRAPHY;  Surgeon: Nelva Bush, MD;  Location: [place] CV LAB;  Service: Cardiovascular;  Laterality: N/A;    Current Medications: Current Meds  Medication Sig   albuterol (PROVENTIL HFA;VENTOLIN HFA) 108 (90 Base) MCG/ACT inhaler Inhale 2 puffs into the lungs every 4 (four) hours as needed for wheezing or shortness of breath.   allopurinol (ZYLOPRIM) 100 MG tablet Take 100 mg by mouth daily.   ANORO ELLIPTA 62.5-25 MCG/INH AEPB INHALE 1 PUFF INTO THE LUNGS DAILY.   aspirin EC 81 MG tablet Take 81 mg by mouth in the morning. Swallow whole.   atorvastatin (LIPITOR) 40 MG tablet Take 1 tablet (40 mg total) by mouth at bedtime.   calcium acetate (PHOSLO) 667 MG capsule Take 667 mg by mouth 3 (three) times daily with meals.   Cholecalciferol (D3-1000) 25 MCG (1000 UT) tablet Take 2 tablets (2,000 Units total) by mouth daily.   cyclobenzaprine (FLEXERIL) 5 MG tablet Take 1 tablet (5 mg total) by mouth 2 (two) times daily as needed for muscle spasms.   docusate sodium (COLACE) 100 MG capsule Take 1 capsule (100 mg total) by mouth daily.   gabapentin (NEURONTIN) 300 MG capsule Take 300 mg by mouth at bedtime.   glimepiride (AMARYL) 4 MG tablet Take 4 mg by mouth daily with breakfast.   insulin degludec (TRESIBA FLEXTOUCH) 100 UNIT/ML FlexTouch Pen Inject 20 Units into the skin at bedtime.   isosorbide mononitrate (IMDUR)  30 MG 24 hr tablet Take 1 tablet (30 mg total) by mouth daily.   metoprolol succinate (TOPROL-XL) 25 MG 24 hr tablet Take 25 mg by mouth daily.   Multiple Vitamins-Minerals (TAB-A-VITE) TABS TAKE 1 TABLET BY MOUTH ONCE DAILY.   OXYGEN Inhale 3 L into the lungs daily.   Risankizumab-rzaa (SKYRIZI) 150 MG/ML SOSY Inject 150 mg into the skin as directed. Every 12 weeks for maintenance.   sacubitril-valsartan (ENTRESTO) 49-51 MG Take 1 tablet by mouth 2 (two) times daily. STOP taking your Losartan and START taking Entresto.   torsemide 40 MG TABS Take TWO tablets (80 mg) TWICE daily only on days you DO NOT have dialysis: Tuesday, Thursday, Saturday and Sunday   triamcinolone (KENALOG) 0.025 % cream Apply 1 application topically daily in the afternoon.   TRULICITY 3 QI/6.9GE SOPN Inject 3 mg into the skin every Thursday.   [DISCONTINUED]  clopidogrel (PLAVIX) 75 MG tablet Take 1 tablet (75 mg total) by mouth daily with breakfast.   [DISCONTINUED] isosorbide mononitrate (IMDUR) 60 MG 24 hr tablet Take 60 mg by mouth daily.   [DISCONTINUED] pantoprazole (PROTONIX) 40 MG tablet Take 1 tablet (40 mg total) by mouth daily.     Allergies:   Shellfish allergy, Betadine [povidone iodine], and Povidone-iodine   Social History   Socioeconomic History   Marital status: Single    Spouse name: Not on file   Number of children: Not on file   Years of education: Not on file   Highest education level: Not on file  Occupational History    Comment: disabled  Tobacco Use   Smoking status: Never   Smokeless tobacco: Never  Vaping Use   Vaping Use: Never used  Substance and Sexual Activity   Alcohol use: Yes    Alcohol/week: 1.0 standard drink    Types: 1 Cans of beer per week    Comment: rare occassion   Drug use: Not Currently    Types: Marijuana    Comment: in high school    Sexual activity: Not on file  Other Topics Concern   Not on file  Social History Narrative   Patient uses continuous oxygen  at 3L/min   Dialysis, AV fistula in L arm   Lives at home by himself ; friend gives pt. Transportation    Social Determinants of Health   Financial Resource Strain: Not on file  Food Insecurity: Not on file  Transportation Needs: Not on file  Physical Activity: Not on file  Stress: Not on file  Social Connections: Not on file     Family History: The patient's family history includes Heart failure in his mother; Kidney failure in his brother.  ROS:   Please see the history of present illness.     All other systems reviewed and are negative.  EKGs/Labs/Other Studies Reviewed:    The following studies were reviewed today:   EKG:  EKG not ordered today.    Recent Labs: 05/08/2021: ALT 16 05/19/2021: Magnesium 1.9 10/31/2021: Hemoglobin 11.9; Platelets 184 11/17/2021: BUN 27; Creatinine, Ser 7.20; Potassium 4.6; Sodium 136  Recent Lipid Panel    Component Value Date/Time   CHOL 100 05/09/2021 0453   CHOL 116 04/21/2019 1334   TRIG 117 05/09/2021 0453   HDL 24 (L) 05/09/2021 0453   HDL 37 (L) 04/21/2019 1334   CHOLHDL 4.2 05/09/2021 0453   VLDL 23 05/09/2021 0453   LDLCALC 53 05/09/2021 0453   LDLCALC 63 04/21/2019 1334    Physical Exam:    VS:  BP 120/72 (BP Location: Left Arm, Patient Position: Sitting, Cuff Size: Large)   Pulse 89   Ht 5\' 3"  (1.6 m)   Wt 263 lb (119.3 kg)   SpO2 94%   BMI 46.59 kg/m     Wt Readings from Last 3 Encounters:  11/27/21 263 lb (119.3 kg)  10/24/21 264 lb (119.7 kg)  09/22/21 251 lb (113.9 kg)     GEN:  Well nourished, well developed in no acute distress, nasal canula HEENT: Normal NECK: No JVD; No carotid bruits LYMPHATICS: No lymphadenopathy CARDIAC: RRR, no murmurs, rubs, gallops RESPIRATORY:  decreased breath sounds at bases. ABDOMEN: Soft, non-tender, distended MUSCULOSKELETAL:  trace edema; No deformity  SKIN: Warm and dry NEUROLOGIC:  Alert and oriented x 3 PSYCHIATRIC:  Normal affect   ASSESSMENT:    1. Coronary  artery disease involving native coronary artery of native  heart without angina pectoris   2. Ventricular fibrillation ([place])   3. Primary hypertension   4. Ischemic cardiomyopathy   5. Urinary retention      PLAN:     CAD (PCI to RCA 2019, PCI to LAD and LCx 08/2021).  Denies chest pain.  Continue aspirin, Plavix, Lipitor.  LDL at goal. hx ventricular fibrillation arrest.  Likely from ischemia, s/p PCI to LAD and left circumflex. Echo with EF 35 to 40%. Hypertension, BP controlled.  Continue Toprol-XL, Entresto.  Reduce Imdur to 30 mg daily. Ischemic cardiomyopathy, EF 35 to 40%, abdominal distention.  Torsemide 80 mg.  Continue Entresto 49-51 mg twice daily, continue Toprol-XL 25 mg daily.  Reduce Imdur to 30 mg daily.  If BP improves, consider uptitrating Entresto.  Continue volume control with hemodialysis.  CMR not possible for EF evaluation due to not being able to lay flat, requiring oxygen, claustrophobia.  Plan to repeat limited echo after up titration of CHF meds. Urinary retention-, refer to urology for additional input.  Patient may have BPH.  Follow-up in 4-6 weeks   Medication Adjustments/Labs and Tests Ordered: Current medicines are reviewed at length with the patient today.  Concerns regarding medicines are outlined above.  Orders Placed This Encounter  Procedures   Ambulatory referral to Urology       Meds ordered this encounter  Medications   clopidogrel (PLAVIX) 75 MG tablet    Sig: Take 1 tablet (75 mg total) by mouth daily with breakfast.    Dispense:  90 tablet    Refill:  0   pantoprazole (PROTONIX) 40 MG tablet    Sig: Take 1 tablet (40 mg total) by mouth daily.    Dispense:  90 tablet    Refill:  0   isosorbide mononitrate (IMDUR) 30 MG 24 hr tablet    Sig: Take 1 tablet (30 mg total) by mouth daily.    Dispense:  30 tablet    Refill:  3      Patient Instructions  Medication Instructions:  Your physician has recommended you make the following  change in your medication:   DECREASE your Imdur to 30 mg daily. A new prescription has been sent to your pharmacy.   *If you need a refill on your cardiac medications before your next appointment, please call your pharmacy*   Lab Work: None ordered If you have labs (blood work) drawn today and your tests are completely normal, you will receive your results only by: [place] (if you have MyChart) OR A paper copy in the mail If you have any lab test that is abnormal or we need to change your treatment, we will call you to review the results.   Testing/Procedures: None ordered   Follow-Up: At [facility], you and your health needs are our priority.  As part of our continuing mission to provide you with exceptional heart care, we have created designated Provider Care Teams.  These Care Teams include your primary Cardiologist (physician) and Advanced Practice Providers (APPs -  Physician Assistants and Nurse Practitioners) who all work together to provide you with the care you need, when you need it.  We recommend signing up for the patient portal called "MyChart".  Sign up information is provided on this After Visit Summary.  MyChart is used to connect with patients for Virtual Visits (Telemedicine).  Patients are able to view lab/test results, encounter notes, upcoming appointments, etc.  Non-urgent messages can be sent to your provider as well.  To learn more about what you can do with MyChart, go to [email or web address].    Your next appointment:   4-6 week(s)  The format for your next appointment:   In Person  Provider:  Kate Sable, MD ONLY  Other Instructions A referral has been sent to [facility]. Their office will call you to set up an appointment.    Signed, Kate Sable, MD  11/27/2021 5:14 PM    [facility]

## 2021-11-28 ENCOUNTER — Ambulatory Visit: Payer: Medicare Other | Admitting: Physician Assistant

## 2021-11-28 NOTE — Progress Notes (Signed)
Virtual Visit via Telephone Note   This visit type was conducted due to national recommendations for restrictions regarding the COVID-19 Pandemic (e.g. social distancing) in an effort to limit this patient's exposure and mitigate transmission in our community.  Due to his co-morbid illnesses, this patient is at least at moderate risk for complications without adequate follow up.  This format is felt to be most appropriate for this patient at this time.  The patient did not have access to video technology/had technical difficulties with video requiring transitioning to audio format only (telephone).  All issues noted in this document were discussed and addressed.  No physical exam could be performed with this format.  Please refer to the patient's chart for his  consent to telehealth for [facility].    Date:  11/29/2021   ID:  [name], DOB 1966/12/02, MRN [id number] The patient was identified using 2 identifiers.  Patient Location: Home Provider Location: Office/Clinic   PCP:  [facility]   [place] [facility] Cardiologist:  Kate Sable, MD Electrophysiologist:  Vickie Epley, MD     Evaluation Performed:  Follow-Up Visit  Chief Complaint: VF arrest  History of Present Illness:    [name] is a 55 y.o. male with VF arrest history, chronic combined systolic and diastolic heart failure, COPD, coronary artery disease, diabetes, and ESRD, hypertension, obstructive sleep apnea who presents for follow-up.  I last saw him via telemedicine on August 16, 2021.  At the time of our last appointment he was wearing a LifeVest.  Left heart cath was planned.  This was performed on August 28, 2021 and showed severe two-vessel coronary artery disease.  He underwent successful PCI to the proximal LAD and mid circumflex.  Echocardiogram performed about a month and a half after PCI showed a left ventricular function of 35 to 40%.  There is global  hypokinesis.  He tells me that he has been doing well since I last spoke with him.  No ischemic symptoms.  He continues to wear the LifeVest.  Past Medical History:  Diagnosis Date   Anemia of chronic disease    Bell's palsy    Cataract    Chronic combined systolic (congestive) and diastolic (congestive) heart failure (HCC)    Chronic respiratory failure with hypoxia (HCC)    3 lpm continuous   COPD (chronic obstructive pulmonary disease) (HCC)    Coronary artery disease    Diabetes mellitus without complication (HCC)    Dyspnea    Wheezing   ESRD on hemodialysis (HCC)    GERD (gastroesophageal reflux disease)    Gout    HOH (hard of hearing)    mild   Hypercholesterolemia    Hypertension    NSVT (nonsustained ventricular tachycardia)    Obstructive sleep apnea    CPAP, O2 use continuosly at 3LPM   Orthopnea    Pneumonia    In Past X2 most recent 1 yr ago   Psoriasis    Pulmonary HTN ([place])    Ventricular fibrillation [facility])    Past Surgical History:  Procedure Laterality Date   A/V FISTULAGRAM Right 05/02/2021   Procedure: A/V FISTULAGRAM;  Surgeon: Katha Cabal, MD;  Location: [place] [facility];  Service: Cardiovascular;  Laterality: Right;   AV FISTULA PLACEMENT Right 11/20/2019   Procedure: ARTERIOVENOUS (AV) FISTULA CREATION ( BRACHIAL CEPHALIC );  Surgeon: Katha Cabal, MD;  Location: [facility] ORS;  Service: Vascular;  Laterality: Right;   CATARACT EXTRACTION  W/PHACO Left 12/17/2019   Procedure: CATARACT EXTRACTION PHACO AND INTRAOCULAR LENS PLACEMENT (IOC) LEFT ISTENT INJ DIABETIC;  Surgeon: Eulogio Bear, MD;  Location: [facility] ORS;  Service: Ophthalmology;  Laterality: Left;  [place] 00:33.2 CDE 2.96 Fluid Pack Lot # [id number]   CATARACT EXTRACTION W/PHACO Right 02/24/2020   Procedure: CATARACT EXTRACTION PHACO AND INTRAOCULAR LENS PLACEMENT (IOC) RIGHT DIABETIC ISTENT INJ;  Surgeon: Eulogio Bear, MD;  Location: [facility] ORS;  Service: Ophthalmology;   Laterality: Right;  [place] 00:37.3 CDE 2.88 Fluid Pack Lot # [id number] H   CORONARY ANGIOPLASTY WITH STENT PLACEMENT     stent placement   CORONARY STENT INTERVENTION N/A 08/28/2021   Procedure: CORONARY STENT INTERVENTION;  Surgeon: Nelva Bush, MD;  Location: [facility];  Service: Cardiovascular;  Laterality: N/A;   DIALYSIS/PERMA CATHETER INSERTION N/A 06/22/2019   Procedure: DIALYSIS/PERMA CATHETER INSERTION;  Surgeon: Algernon Huxley, MD;  Location: [place] [facility];  Service: Cardiovascular;  Laterality: N/A;   DIALYSIS/PERMA CATHETER REMOVAL N/A 05/31/2020   Procedure: DIALYSIS/PERMA CATHETER REMOVAL;  Surgeon: Katha Cabal, MD;  Location: [place] CV LAB;  Service: Cardiovascular;  Laterality: N/A;   INSERTION OF AHMED VALVE Left 12/17/2019   Procedure: INSERTION OF iSTENT;  Surgeon: Eulogio Bear, MD;  Location: [facility] ORS;  Service: Ophthalmology;  Laterality: Left;   INTRAVASCULAR IMAGING/OCT N/A 08/28/2021   Procedure: INTRAVASCULAR IMAGING/OCT;  Surgeon: Nelva Bush, MD;  Location: [place] CV LAB;  Service: Cardiovascular;  Laterality: N/A;   LEFT HEART CATH AND CORONARY ANGIOGRAPHY N/A 08/08/2021   Procedure: LEFT HEART CATH AND CORONARY ANGIOGRAPHY;  Surgeon: Nelva Bush, MD;  Location: [place] CV LAB;  Service: Cardiovascular;  Laterality: N/A;     Current Meds  Medication Sig   albuterol (PROVENTIL HFA;VENTOLIN HFA) 108 (90 Base) MCG/ACT inhaler Inhale 2 puffs into the lungs every 4 (four) hours as needed for wheezing or shortness of breath.   allopurinol (ZYLOPRIM) 100 MG tablet Take 100 mg by mouth daily.   ANORO ELLIPTA 62.5-25 MCG/INH AEPB INHALE 1 PUFF INTO THE LUNGS DAILY.   aspirin EC 81 MG tablet Take 81 mg by mouth in the morning. Swallow whole.   atorvastatin (LIPITOR) 40 MG tablet Take 1 tablet (40 mg total) by mouth at bedtime.   calcium acetate (PHOSLO) 667 MG capsule Take 667 mg by mouth 3 (three) times daily with meals.    Cholecalciferol (D3-1000) 25 MCG (1000 UT) tablet Take 2 tablets (2,000 Units total) by mouth daily.   clopidogrel (PLAVIX) 75 MG tablet Take 1 tablet (75 mg total) by mouth daily with breakfast.   cyclobenzaprine (FLEXERIL) 5 MG tablet Take 1 tablet (5 mg total) by mouth 2 (two) times daily as needed for muscle spasms.   docusate sodium (COLACE) 100 MG capsule Take 1 capsule (100 mg total) by mouth daily.   gabapentin (NEURONTIN) 300 MG capsule Take 300 mg by mouth at bedtime.   glimepiride (AMARYL) 4 MG tablet Take 4 mg by mouth daily with breakfast.   insulin degludec (TRESIBA FLEXTOUCH) 100 UNIT/ML FlexTouch Pen Inject 20 Units into the skin at bedtime.   isosorbide mononitrate (IMDUR) 30 MG 24 hr tablet Take 1 tablet (30 mg total) by mouth daily.   metoprolol succinate (TOPROL-XL) 25 MG 24 hr tablet Take 25 mg by mouth daily.   Multiple Vitamins-Minerals (TAB-A-VITE) TABS TAKE 1 TABLET BY MOUTH ONCE DAILY.   OXYGEN Inhale 3 L into the lungs daily.   pantoprazole (PROTONIX) 40 MG tablet  Take 1 tablet (40 mg total) by mouth daily.   Risankizumab-rzaa (SKYRIZI) 150 MG/ML SOSY Inject 150 mg into the skin as directed. Every 12 weeks for maintenance.   sacubitril-valsartan (ENTRESTO) 49-51 MG Take 1 tablet by mouth 2 (two) times daily. STOP taking your Losartan and START taking Entresto.   torsemide 40 MG TABS Take TWO tablets (80 mg) TWICE daily only on days you DO NOT have dialysis: Tuesday, Thursday, Saturday and Sunday   triamcinolone (KENALOG) 0.025 % cream Apply 1 application topically daily in the afternoon.   TRULICITY 3 CH/8.8FO SOPN Inject 3 mg into the skin every Thursday.     Allergies:   Shellfish allergy, Betadine [povidone iodine], and Povidone-iodine   Social History   Tobacco Use   Smoking status: Never   Smokeless tobacco: Never  Vaping Use   Vaping Use: Never used  Substance Use Topics   Alcohol use: Yes    Alcohol/week: 1.0 standard drink    Types: 1 Cans of beer per  week    Comment: rare occassion   Drug use: Not Currently    Types: Marijuana    Comment: in high school      Family Hx: The patient's family history includes Heart failure in his mother; Kidney failure in his brother.  ROS:   Please see the history of present illness.     All other systems reviewed and are negative.   Prior CV studies:   The following studies were reviewed today:  October 10, 2021 echo Left ventricular function moderately reduced, 35 to 40% Global hypokinesis Right ventricular function normal No MR Mild to moderate TR   Labs/Other Tests and Data Reviewed:    EKG:  No ECG reviewed.  Recent Labs: 05/08/2021: ALT 16 05/19/2021: Magnesium 1.9 10/31/2021: Hemoglobin 11.9; Platelets 184 11/17/2021: BUN 27; Creatinine, Ser 7.20; Potassium 4.6; Sodium 136   Recent Lipid Panel Lab Results  Component Value Date/Time   CHOL 100 05/09/2021 04:53 AM   CHOL 116 04/21/2019 01:34 PM   TRIG 117 05/09/2021 04:53 AM   HDL 24 (L) 05/09/2021 04:53 AM   HDL 37 (L) 04/21/2019 01:34 PM   CHOLHDL 4.2 05/09/2021 04:53 AM   LDLCALC 53 05/09/2021 04:53 AM   LDLCALC 63 04/21/2019 01:34 PM    Wt Readings from Last 3 Encounters:  11/29/21 263 lb (119.3 kg)  11/27/21 263 lb (119.3 kg)  10/24/21 264 lb (119.7 kg)         Objective:    Vital Signs:  BP 125/86    Pulse (!) 120    Ht 5\' 3"  (1.6 m)    Wt 263 lb (119.3 kg)    BMI 46.59 kg/m    VITAL SIGNS:  reviewed  ASSESSMENT & PLAN:     Chronic systolic heart failure NYHA class II-III.  Does not sound volume overloaded by interview.  Continue current medical therapy.  He is still in a bit of a gray zone as far as his left ventricular ejection fraction goes.  I Georgina Peer get a cardiac MRI to more accurately assess the left ventricular function.  If it is above 35%, plan to discontinue LifeVest and he will not require ICD.  If that is the case we will continue with intermittent monitoring of his left ventricular function. If  the MRI reveals a left ventricular function less than 35%, plan to proceed with subcutaneous ICD implant.  2.  Coronary artery disease No ischemic symptoms.  Status post recent PCI in September 2022.  Continue Plavix and aspirin and Lipitor.     Time:   Today, I have spent 10 minutes with the patient with telehealth technology discussing the above problems.     Medication Adjustments/Labs and Tests Ordered: Current medicines are reviewed at length with the patient today.  Concerns regarding medicines are outlined above.   Tests Ordered: No orders of the defined types were placed in this encounter.   Medication Changes: No orders of the defined types were placed in this encounter.   Follow Up:   Plan to follow-up after cardiac MRI  Signed, Vickie Epley, MD  11/29/2021 1:42 PM    [facility]

## 2021-11-29 ENCOUNTER — Encounter: Payer: Self-pay | Admitting: Cardiology

## 2021-11-29 ENCOUNTER — Ambulatory Visit (INDEPENDENT_AMBULATORY_CARE_PROVIDER_SITE_OTHER): Payer: Medicare Other | Admitting: Cardiology

## 2021-11-29 ENCOUNTER — Other Ambulatory Visit (INDEPENDENT_AMBULATORY_CARE_PROVIDER_SITE_OTHER): Payer: Self-pay | Admitting: Nurse Practitioner

## 2021-11-29 ENCOUNTER — Telehealth: Payer: Self-pay | Admitting: Cardiology

## 2021-11-29 ENCOUNTER — Other Ambulatory Visit: Payer: Self-pay

## 2021-11-29 VITALS — BP 125/86 | HR 120 | Ht 63.0 in | Wt 263.0 lb

## 2021-11-29 DIAGNOSIS — I4901 Ventricular fibrillation: Secondary | ICD-10-CM

## 2021-11-29 DIAGNOSIS — N186 End stage renal disease: Secondary | ICD-10-CM

## 2021-11-29 DIAGNOSIS — I5042 Chronic combined systolic (congestive) and diastolic (congestive) heart failure: Secondary | ICD-10-CM | POA: Diagnosis not present

## 2021-11-29 DIAGNOSIS — I2510 Atherosclerotic heart disease of native coronary artery without angina pectoris: Secondary | ICD-10-CM

## 2021-11-29 DIAGNOSIS — I10 Essential (primary) hypertension: Secondary | ICD-10-CM | POA: Diagnosis not present

## 2021-11-29 NOTE — Telephone Encounter (Signed)
Fu after mri with lambert

## 2021-11-29 NOTE — Patient Instructions (Addendum)
Medication Instructions:  Your physician recommends that you continue on your current medications as directed. Please refer to the Current Medication list given to you today. *If you need a refill on your cardiac medications before your next appointment, please call your pharmacy*  Lab Work: None ordered. If you have labs (blood work) drawn today and your tests are completely normal, you will receive your results only by: Nowthen (if you have MyChart) OR A paper copy in the mail If you have any lab test that is abnormal or we need to change your treatment, we will call you to review the results.  Testing/Procedures: You will be scheduled for an MRI  Follow-Up: At [facility], you and your health needs are our priority.  As part of our continuing mission to provide you with exceptional heart care, we have created designated Provider Care Teams.  These Care Teams include your primary Cardiologist (physician) and Advanced Practice Providers (APPs -  Physician Assistants and Nurse Practitioners) who all work together to provide you with the care you need, when you need it.  Your next appointment:   Your physician wants you to follow-up in: 4-6 weeks with Dr. Quentin Ore (after cardiac MRI)

## 2021-11-30 ENCOUNTER — Ambulatory Visit (INDEPENDENT_AMBULATORY_CARE_PROVIDER_SITE_OTHER): Payer: Medicare Other | Admitting: Vascular Surgery

## 2021-11-30 ENCOUNTER — Encounter (INDEPENDENT_AMBULATORY_CARE_PROVIDER_SITE_OTHER): Payer: Self-pay | Admitting: Vascular Surgery

## 2021-11-30 ENCOUNTER — Ambulatory Visit (INDEPENDENT_AMBULATORY_CARE_PROVIDER_SITE_OTHER): Payer: Medicare Other

## 2021-11-30 ENCOUNTER — Other Ambulatory Visit: Payer: Self-pay

## 2021-11-30 VITALS — BP 141/84 | HR 90 | Resp 16 | Wt 264.2 lb

## 2021-11-30 DIAGNOSIS — I2510 Atherosclerotic heart disease of native coronary artery without angina pectoris: Secondary | ICD-10-CM | POA: Diagnosis not present

## 2021-11-30 DIAGNOSIS — N186 End stage renal disease: Secondary | ICD-10-CM

## 2021-11-30 DIAGNOSIS — E7800 Pure hypercholesterolemia, unspecified: Secondary | ICD-10-CM | POA: Diagnosis not present

## 2021-11-30 DIAGNOSIS — Z992 Dependence on renal dialysis: Secondary | ICD-10-CM

## 2021-11-30 DIAGNOSIS — T829XXS Unspecified complication of cardiac and vascular prosthetic device, implant and graft, sequela: Secondary | ICD-10-CM | POA: Diagnosis not present

## 2021-11-30 DIAGNOSIS — I10 Essential (primary) hypertension: Secondary | ICD-10-CM

## 2021-11-30 NOTE — Progress Notes (Signed)
MRN : [id number]  [name] is a 55 y.o. (13-May-1966) male who presents with chief complaint of check fistula.  History of Present Illness:  The patient returns to the office for followup status post intervention of the dialysis access 05/02/2021.  Procedure: Percutaneous transluminal angioplasty and stent placement peripheral segment in 2 locations right brachiocephalic fistula   Following the intervention the access function has significantly improved, with better flow rates and improved KT/V. The patient has not been experiencing increased bleeding times following decannulation and the patient denies increased recirculation. The patient denies an increase in arm swelling. At the present time the patient denies hand pain.  The patient is now on continuous O2  The patient denies amaurosis fugax or recent TIA symptoms. There are no recent neurological changes noted. The patient denies claudication symptoms or rest pain symptoms. The patient denies history of DVT, PE or superficial thrombophlebitis. The patient denies recent episodes of angina or shortness of breath.    Duplex ultrasound of the AV access shows a patent access.  The previously noted stenosis is unchanged compared to last study.  Flow Volume is 2238 cc/min.     Current Meds  Medication Sig   albuterol (PROVENTIL HFA;VENTOLIN HFA) 108 (90 Base) MCG/ACT inhaler Inhale 2 puffs into the lungs every 4 (four) hours as needed for wheezing or shortness of breath.   allopurinol (ZYLOPRIM) 100 MG tablet Take 100 mg by mouth daily.   ANORO ELLIPTA 62.5-25 MCG/INH AEPB INHALE 1 PUFF INTO THE LUNGS DAILY.   aspirin EC 81 MG tablet Take 81 mg by mouth in the morning. Swallow whole.   atorvastatin (LIPITOR) 40 MG tablet Take 1 tablet (40 mg total) by mouth at bedtime.   calcium acetate (PHOSLO) 667 MG capsule Take 667 mg by mouth 3 (three) times daily with meals.   Cholecalciferol (D3-1000) 25 MCG (1000 UT) tablet Take 2  tablets (2,000 Units total) by mouth daily.   clopidogrel (PLAVIX) 75 MG tablet Take 1 tablet (75 mg total) by mouth daily with breakfast.   cyclobenzaprine (FLEXERIL) 5 MG tablet Take 1 tablet (5 mg total) by mouth 2 (two) times daily as needed for muscle spasms.   docusate sodium (COLACE) 100 MG capsule Take 1 capsule (100 mg total) by mouth daily.   gabapentin (NEURONTIN) 300 MG capsule Take 300 mg by mouth at bedtime.   glimepiride (AMARYL) 4 MG tablet Take 4 mg by mouth daily with breakfast.   insulin degludec (TRESIBA FLEXTOUCH) 100 UNIT/ML FlexTouch Pen Inject 20 Units into the skin at bedtime.   isosorbide mononitrate (IMDUR) 30 MG 24 hr tablet Take 1 tablet (30 mg total) by mouth daily.   metoprolol succinate (TOPROL-XL) 25 MG 24 hr tablet Take 25 mg by mouth daily.   Multiple Vitamins-Minerals (TAB-A-VITE) TABS TAKE 1 TABLET BY MOUTH ONCE DAILY.   OXYGEN Inhale 3 L into the lungs daily.   pantoprazole (PROTONIX) 40 MG tablet Take 1 tablet (40 mg total) by mouth daily.   Risankizumab-rzaa (SKYRIZI) 150 MG/ML SOSY Inject 150 mg into the skin as directed. Every 12 weeks for maintenance.   sacubitril-valsartan (ENTRESTO) 49-51 MG Take 1 tablet by mouth 2 (two) times daily. STOP taking your Losartan and START taking Entresto.   torsemide 40 MG TABS Take TWO tablets (80 mg) TWICE daily only on days you DO NOT have dialysis: Tuesday, Thursday, Saturday and Sunday   triamcinolone (KENALOG) 0.025 % cream Apply 1 application topically daily in the afternoon.  TRULICITY 3 WL/7.9GX SOPN Inject 3 mg into the skin every Thursday.    Past Medical History:  Diagnosis Date   Anemia of chronic disease    Bell's palsy    Cataract    Chronic combined systolic (congestive) and diastolic (congestive) heart failure (HCC)    Chronic respiratory failure with hypoxia (HCC)    3 lpm continuous   COPD (chronic obstructive pulmonary disease) (HCC)    Coronary artery disease    Diabetes mellitus without  complication (HCC)    Dyspnea    Wheezing   ESRD on hemodialysis (HCC)    GERD (gastroesophageal reflux disease)    Gout    HOH (hard of hearing)    mild   Hypercholesterolemia    Hypertension    NSVT (nonsustained ventricular tachycardia)    Obstructive sleep apnea    CPAP, O2 use continuosly at 3LPM   Orthopnea    Pneumonia    In Past X2 most recent 1 yr ago   Psoriasis    Pulmonary HTN ([place])    Ventricular fibrillation [facility])     Past Surgical History:  Procedure Laterality Date   A/V FISTULAGRAM Right 05/02/2021   Procedure: A/V FISTULAGRAM;  Surgeon: Katha Cabal, MD;  Location: [place] CV LAB;  Service: Cardiovascular;  Laterality: Right;   AV FISTULA PLACEMENT Right 11/20/2019   Procedure: ARTERIOVENOUS (AV) FISTULA CREATION ( BRACHIAL CEPHALIC );  Surgeon: Katha Cabal, MD;  Location: [facility] ORS;  Service: Vascular;  Laterality: Right;   CATARACT EXTRACTION W/PHACO Left 12/17/2019   Procedure: CATARACT EXTRACTION PHACO AND INTRAOCULAR LENS PLACEMENT ([place]) LEFT ISTENT INJ DIABETIC;  Surgeon: Eulogio Bear, MD;  Location: [facility] ORS;  Service: Ophthalmology;  Laterality: Left;  [place] 00:33.2 CDE 2.96 Fluid Pack Lot # [id number] H   CATARACT EXTRACTION W/PHACO Right 02/24/2020   Procedure: CATARACT EXTRACTION PHACO AND INTRAOCULAR LENS PLACEMENT (IOC) RIGHT DIABETIC ISTENT INJ;  Surgeon: Eulogio Bear, MD;  Location: [facility] ORS;  Service: Ophthalmology;  Laterality: Right;  [place] 00:37.3 CDE 2.88 Fluid Pack Lot # [id number] H   CORONARY ANGIOPLASTY WITH STENT PLACEMENT     stent placement   CORONARY STENT INTERVENTION N/A 08/28/2021   Procedure: CORONARY STENT INTERVENTION;  Surgeon: Nelva Bush, MD;  Location: [place] CV LAB;  Service: Cardiovascular;  Laterality: N/A;   DIALYSIS/PERMA CATHETER INSERTION N/A 06/22/2019   Procedure: DIALYSIS/PERMA CATHETER INSERTION;  Surgeon: Algernon Huxley, MD;  Location: [place] CV LAB;  Service: Cardiovascular;   Laterality: N/A;   DIALYSIS/PERMA CATHETER REMOVAL N/A 05/31/2020   Procedure: DIALYSIS/PERMA CATHETER REMOVAL;  Surgeon: Katha Cabal, MD;  Location: [place] [facility];  Service: Cardiovascular;  Laterality: N/A;   INSERTION OF AHMED VALVE Left 12/17/2019   Procedure: INSERTION OF iSTENT;  Surgeon: Eulogio Bear, MD;  Location: [facility] ORS;  Service: Ophthalmology;  Laterality: Left;   INTRAVASCULAR IMAGING/OCT N/A 08/28/2021   Procedure: INTRAVASCULAR IMAGING/OCT;  Surgeon: Nelva Bush, MD;  Location: [place] CV LAB;  Service: Cardiovascular;  Laterality: N/A;   LEFT HEART CATH AND CORONARY ANGIOGRAPHY N/A 08/08/2021   Procedure: LEFT HEART CATH AND CORONARY ANGIOGRAPHY;  Surgeon: Nelva Bush, MD;  Location: [place] CV LAB;  Service: Cardiovascular;  Laterality: N/A;    Social History Social History   Tobacco Use   Smoking status: Never   Smokeless tobacco: Never  Vaping Use   Vaping Use: Never used  Substance Use Topics   Alcohol use: Yes    Alcohol/week: 1.0 standard  drink    Types: 1 Cans of beer per week    Comment: rare occassion   Drug use: Not Currently    Types: Marijuana    Comment: in high school     Family History Family History  Problem Relation Age of Onset   Heart failure Mother    Kidney failure Brother     Allergies  Allergen Reactions   Shellfish Allergy Anaphylaxis    Face and throat swelling, difficulty breathing Allergy can be triggered by touching (contact)   Betadine [Povidone Iodine] Rash   Povidone-Iodine Rash     REVIEW OF SYSTEMS (Negative unless checked)  Constitutional: [] Weight loss  [] Fever  [] Chills Cardiac: [] Chest pain   [] Chest pressure   [] Palpitations   [] Shortness of breath when laying flat   [] Shortness of breath with exertion. Vascular:  [] Pain in legs with walking   [] Pain in legs at rest  [] History of DVT   [] Phlebitis   [] Swelling in legs   [] Varicose veins   [] Non-healing ulcers Pulmonary:    [] Uses home oxygen   [] Productive cough   [] Hemoptysis   [] Wheeze  [] COPD   [] Asthma Neurologic:  [] Dizziness   [] Seizures   [] History of stroke   [] History of TIA  [] Aphasia   [] Vissual changes   [] Weakness or numbness in arm   [] Weakness or numbness in leg Musculoskeletal:   [] Joint swelling   [] Joint pain   [] Low back pain Hematologic:  [] Easy bruising  [] Easy bleeding   [] Hypercoagulable state   [] Anemic Gastrointestinal:  [] Diarrhea   [] Vomiting  [x] Gastroesophageal reflux/heartburn   [] Difficulty swallowing. Genitourinary:  [x] Chronic kidney disease   [] Difficult urination  [] Frequent urination   [] Blood in urine Skin:  [] Rashes   [] Ulcers  Psychological:  [] History of anxiety   []  History of major depression.  Physical Examination  Vitals:   11/30/21 0951  BP: (!) 141/84  Pulse: 90  Resp: 16  Weight: 264 lb 3.2 oz (119.8 kg)   Body mass index is 46.8 kg/m. Gen: WD/WN, NAD Head: [name]/AT, No temporalis wasting.  Ear/Nose/Throat: Hearing grossly intact, nares w/o erythema or drainage Eyes: PER, EOMI, sclera nonicteric.  Neck: Supple, no gross masses or lesions.  No JVD.  Pulmonary:  Good air movement, no audible wheezing, no use of accessory muscles.  Cardiac: RRR, precordium non-hyperdynamic. Vascular:    right arm brachial cephalic fistula good thrill mildly pulsatile good bruit Vessel Right Left  Radial Palpable Palpable  Brachial Palpable Palpable  Gastrointestinal: soft, non-distended. No guarding/no peritoneal signs.  Musculoskeletal: M/S 5/5 throughout.  No deformity.  Neurologic: CN 2-12 intact. Pain and light touch intact in extremities.  Symmetrical.  Speech is fluent. Motor exam as listed above. Psychiatric: Judgment intact, Mood & affect appropriate for pt's clinical situation. Dermatologic: No rashes or ulcers noted.  No changes consistent with cellulitis.   CBC Lab Results  Component Value Date   WBC 10.3 10/31/2021   HGB 11.9 (L) 10/31/2021   HCT 37.3 (L)  10/31/2021   MCV 90.1 10/31/2021   PLT 184 10/31/2021    BMET    Component Value Date/Time   NA 136 11/17/2021 1134   NA 142 04/21/2019 1334   K 4.6 11/17/2021 1134   CL 96 (L) 11/17/2021 1134   CO2 30 11/17/2021 1134   GLUCOSE 202 (H) 11/17/2021 1134   BUN 27 (H) 11/17/2021 1134   BUN 21 04/21/2019 1334   CREATININE 7.20 (H) 11/17/2021 1134   CALCIUM 8.7 (L) 11/17/2021 1134  GFRNONAA 8 (L) 11/17/2021 1134   GFRAA 10 (L) 11/21/2019 1853   Estimated Creatinine Clearance: 13.8 mL/min (A) (by C-G formula based on SCr of 7.2 mg/dL (H)).  COAG Lab Results  Component Value Date   INR 1.1 11/17/2019   INR 1.1 06/15/2019   INR 1.1 02/18/2019    Radiology No results found.   Assessment/Plan 1. ESRD (end stage renal disease) on dialysis [facility]) Recommend:  The patient is doing well and currently has adequate dialysis access. The patient's dialysis center is not reporting any access issues. Flow pattern is stable when compared to the prior ultrasound.  The patient should have a duplex ultrasound of the dialysis access in 6 months. The patient will follow-up with me in the office after each ultrasound    - VAS [place] (AVF, AVG); Future  2. Complication from renal dialysis device, sequela Recommend:  The patient is doing well and currently has adequate dialysis access. The patient's dialysis center is not reporting any access issues. Flow pattern is stable when compared to the prior ultrasound.  The patient should have a duplex ultrasound of the dialysis access in 6 months. The patient will follow-up with me in the office after each ultrasound  - VAS [place] (AVF, AVG); Future  3. Pure hypercholesterolemia Continue statin as ordered and reviewed, no changes at this time   4. Coronary artery disease involving native coronary artery of native heart without angina pectoris Continue cardiac and antihypertensive medications as already  ordered and reviewed, no changes at this time.  Continue statin as ordered and reviewed, no changes at this time  Nitrates PRN for chest pain   5. Essential hypertension Continue antihypertensive medications as already ordered, these medications have been reviewed and there are no changes at this time.     [name], MD  11/30/2021 10:08 AM

## 2021-12-07 ENCOUNTER — Other Ambulatory Visit: Payer: Self-pay | Admitting: Adult Health

## 2021-12-07 ENCOUNTER — Ambulatory Visit (INDEPENDENT_AMBULATORY_CARE_PROVIDER_SITE_OTHER): Payer: Medicare Other | Admitting: Urology

## 2021-12-07 ENCOUNTER — Encounter: Payer: Self-pay | Admitting: Urology

## 2021-12-07 ENCOUNTER — Other Ambulatory Visit: Payer: Self-pay

## 2021-12-07 VITALS — BP 98/64 | HR 92 | Ht 63.0 in | Wt 263.0 lb

## 2021-12-07 DIAGNOSIS — R1900 Intra-abdominal and pelvic swelling, mass and lump, unspecified site: Secondary | ICD-10-CM | POA: Diagnosis not present

## 2021-12-07 DIAGNOSIS — R3911 Hesitancy of micturition: Secondary | ICD-10-CM

## 2021-12-07 DIAGNOSIS — R3912 Poor urinary stream: Secondary | ICD-10-CM

## 2021-12-07 DIAGNOSIS — E1165 Type 2 diabetes mellitus with hyperglycemia: Secondary | ICD-10-CM

## 2021-12-07 LAB — BLADDER SCAN AMB NON-IMAGING: Scan Result: 0

## 2021-12-07 MED ORDER — SILODOSIN 4 MG PO CAPS: 4.0000 mg | ORAL_CAPSULE | Freq: Every day | ORAL | 0 refills | Status: DC

## 2021-12-07 NOTE — Progress Notes (Addendum)
12/07/2021 10:35 AM   [name] [birth year] [id number]  Referring provider: Kate Sable, MD [place],  [place] [address]  Chief Complaint  Patient presents with   Urinary Retention    HPI: [name] is a 55 y.o. male referred for evaluation of lower urinary tract symptoms.  2 months history of urinary hesitancy and weak urinary stream IPSS 7/35 Denies dysuria, gross hematuria He is on hemodialysis and states he voids 2-3 times per day Current voiding pattern is a definite change over the last 2 months IPSS today 7/35 Past urologic history remarkable for penile implant several years ago in [place] No prior treatment for his lower urinary tract symptoms   PMH: Past Medical History:  Diagnosis Date   Anemia of chronic disease    Bell's palsy    Cataract    Chronic combined systolic (congestive) and diastolic (congestive) heart failure (HCC)    Chronic respiratory failure with hypoxia (HCC)    3 lpm continuous   COPD (chronic obstructive pulmonary disease) (HCC)    Coronary artery disease    Diabetes mellitus without complication (HCC)    Dyspnea    Wheezing   ESRD on hemodialysis (HCC)    GERD (gastroesophageal reflux disease)    Gout    HOH (hard of hearing)    mild   Hypercholesterolemia    Hypertension    NSVT (nonsustained ventricular tachycardia)    Obstructive sleep apnea    CPAP, O2 use continuosly at 3LPM   Orthopnea    Pneumonia    In Past X2 most recent 1 yr ago   Psoriasis    Pulmonary HTN ([place])    Ventricular fibrillation [place])     Surgical History: Past Surgical History:  Procedure Laterality Date   A/V FISTULAGRAM Right 05/02/2021   Procedure: A/V FISTULAGRAM;  Surgeon: Katha Cabal, MD;  Location: [facility];  Service: Cardiovascular;  Laterality: Right;   AV FISTULA PLACEMENT Right 11/20/2019   Procedure: ARTERIOVENOUS (AV) FISTULA CREATION ( BRACHIAL CEPHALIC );  Surgeon:  Katha Cabal, MD;  Location: [facility];  Service: Vascular;  Laterality: Right;   CATARACT EXTRACTION W/PHACO Left 12/17/2019   Procedure: CATARACT EXTRACTION PHACO AND INTRAOCULAR LENS PLACEMENT ([place]) LEFT ISTENT INJ DIABETIC;  Surgeon: Eulogio Bear, MD;  Location: [facility];  Service: Ophthalmology;  Laterality: Left;  [place] 00:33.2 CDE 2.96 Fluid Pack Lot # [id number]   CATARACT EXTRACTION W/PHACO Right 02/24/2020   Procedure: CATARACT EXTRACTION PHACO AND INTRAOCULAR LENS PLACEMENT ([facility]) RIGHT DIABETIC ISTENT INJ;  Surgeon: Eulogio Bear, MD;  Location: [facility];  Service: Ophthalmology;  Laterality: Right;  [place] 00:37.3 CDE 2.88 Fluid Pack Lot # [id number]   CORONARY ANGIOPLASTY WITH STENT PLACEMENT     stent placement   CORONARY STENT INTERVENTION N/A 08/28/2021   Procedure: CORONARY STENT INTERVENTION;  Surgeon: Nelva Bush, MD;  Location: [facility];  Service: Cardiovascular;  Laterality: N/A;   DIALYSIS/PERMA CATHETER INSERTION N/A 06/22/2019   Procedure: DIALYSIS/PERMA CATHETER INSERTION;  Surgeon: Algernon Huxley, MD;  Location: [facility];  Service: Cardiovascular;  Laterality: N/A;   DIALYSIS/PERMA CATHETER REMOVAL N/A 05/31/2020   Procedure: DIALYSIS/PERMA CATHETER REMOVAL;  Surgeon: Katha Cabal, MD;  Location: [facility];  Service: Cardiovascular;  Laterality: N/A;   INSERTION OF AHMED VALVE Left 12/17/2019   Procedure: INSERTION OF iSTENT;  Surgeon: Eulogio Bear, MD;  Location: [facility];  Service: Ophthalmology;  Laterality:  Left;   INTRAVASCULAR IMAGING/OCT N/A 08/28/2021   Procedure: INTRAVASCULAR IMAGING/OCT;  Surgeon: Nelva Bush, MD;  Location: [facility];  Service: Cardiovascular;  Laterality: N/A;   LEFT HEART CATH AND CORONARY ANGIOGRAPHY N/A 08/08/2021   Procedure: LEFT HEART CATH AND CORONARY ANGIOGRAPHY;  Surgeon: Nelva Bush, MD;  Location: [facility];  Service: Cardiovascular;  Laterality: N/A;     Home Medications:  Allergies as of 12/07/2021       Reactions   Shellfish Allergy Anaphylaxis   Face and throat swelling, difficulty breathing Allergy can be triggered by touching (contact)   Betadine [povidone Iodine] Rash   Povidone-iodine Rash        Medication List        Accurate as of December 07, 2021 10:35 AM. If you have any questions, ask your nurse or doctor.          albuterol 108 (90 Base) MCG/ACT inhaler Commonly known as: VENTOLIN HFA Inhale 2 puffs into the lungs every 4 (four) hours as needed for wheezing or shortness of breath.   allopurinol 100 MG tablet Commonly known as: ZYLOPRIM Take 100 mg by mouth daily.   Anoro Ellipta 62.5-25 MCG/ACT Aepb Generic drug: umeclidinium-vilanterol INHALE 1 PUFF INTO THE LUNGS DAILY.   aspirin EC 81 MG tablet Take 81 mg by mouth in the morning. Swallow whole.   atorvastatin 40 MG tablet Commonly known as: LIPITOR Take 1 tablet (40 mg total) by mouth at bedtime.   calcium acetate 667 MG capsule Commonly known as: PHOSLO Take 667 mg by mouth 3 (three) times daily with meals.   Cholecalciferol 25 MCG (1000 UT) tablet Commonly known as: D3-1000 Take 2 tablets (2,000 Units total) by mouth daily.   clopidogrel 75 MG tablet Commonly known as: PLAVIX Take 1 tablet (75 mg total) by mouth daily with breakfast.   cyclobenzaprine 5 MG tablet Commonly known as: FLEXERIL Take 1 tablet (5 mg total) by mouth 2 (two) times daily as needed for muscle spasms.   docusate sodium 100 MG capsule Commonly known as: Colace Take 1 capsule (100 mg total) by mouth daily.   Entresto 49-51 MG Generic drug: sacubitril-valsartan Take 1 tablet by mouth 2 (two) times daily. STOP taking your Losartan and START taking Entresto.   gabapentin 300 MG capsule Commonly known as: NEURONTIN Take 300 mg by mouth at bedtime.   glimepiride 4 MG tablet Commonly known as: AMARYL Take 4 mg by mouth daily with breakfast.   isosorbide  mononitrate 30 MG 24 hr tablet Commonly known as: IMDUR Take 1 tablet (30 mg total) by mouth daily.   metoprolol succinate 25 MG 24 hr tablet Commonly known as: TOPROL-XL Take 25 mg by mouth daily.   OXYGEN Inhale 3 L into the lungs daily.   pantoprazole 40 MG tablet Commonly known as: PROTONIX Take 1 tablet (40 mg total) by mouth daily.   silodosin 4 MG Caps capsule Commonly known as: RAPAFLO Take 1 capsule (4 mg total) by mouth daily with breakfast. Started by: Abbie Sons, MD   Skyrizi 150 MG/ML Sosy Generic drug: Risankizumab-rzaa Inject 150 mg into the skin as directed. Every 12 weeks for maintenance.   Tab-A-Vite Tabs TAKE 1 TABLET BY MOUTH ONCE DAILY.   Torsemide 40 MG Tabs Take TWO tablets (80 mg) TWICE daily only on days you DO NOT have dialysis: Tuesday, Thursday, Saturday and Sunday   Tresiba FlexTouch 100 UNIT/ML FlexTouch Pen Generic drug: insulin degludec Inject 20 Units into  the skin at bedtime.   triamcinolone 0.025 % cream Commonly known as: KENALOG Apply 1 application topically daily in the afternoon.   Trulicity 3 LH/7.3SK Sopn Generic drug: Dulaglutide Inject 3 mg into the skin every Thursday.        Allergies:  Allergies  Allergen Reactions   Shellfish Allergy Anaphylaxis    Face and throat swelling, difficulty breathing Allergy can be triggered by touching (contact)   Betadine [Povidone Iodine] Rash   Povidone-Iodine Rash    Family History: Family History  Problem Relation Age of Onset   Heart failure Mother    Kidney failure Brother     Social History:  reports that he has never smoked. He has never used smokeless tobacco. He reports current alcohol use of about 1.0 standard drink per week. He reports that he does not currently use drugs after having used the following drugs: Marijuana.   Physical Exam: BP 98/64    Pulse 92    Ht 5\' 3"  (1.6 m)    Wt 263 lb (119.3 kg)    BMI 46.59 kg/m   Constitutional:  Alert and  oriented, No acute distress. HEENT: [place] AT, moist mucus membranes.  Trachea midline, no masses. Cardiovascular: No clubbing, cyanosis, or edema. Respiratory: Normal respiratory effort, no increased work of breathing. GI: Abdomen is soft, nontender, nondistended, no abdominal masses GU: Phallus without lesion.  Malleable penile implant in place in good position.  Meatus normal in appearance.  Testes descended bilaterally without mass.  Prostate 35-40 g, smooth without nodules Skin: No rashes, bruises or suspicious lesions. Neurologic: Grossly intact, no focal deficits, moving all 4 extremities. Psychiatric: Normal mood and affect.   Assessment & Plan:    1.  Lower urinary tract symptoms Bothersome obstructive voiding symptoms We discussed BPH would be the most common etiology Trial silodosin 4 mg daily Follow-up 1 month for symptom recheck and would recommend cystoscopy at that visit if no improvement in symptoms  2.  Pelvic mass Patient had a renal ultrasound in 2020 which showed a 5.3 cm anechoic mass adjacent to the bladder with calcifications CT was recommended but it does not appear this was ever followed up Will discuss further evaluation at follow-up visit   Abbie Sons, [place] [address], [place], [place] [address] [phone]

## 2021-12-14 ENCOUNTER — Ambulatory Visit (INDEPENDENT_AMBULATORY_CARE_PROVIDER_SITE_OTHER): Payer: Medicare Other | Admitting: Podiatry

## 2021-12-14 ENCOUNTER — Encounter: Payer: Self-pay | Admitting: Podiatry

## 2021-12-14 ENCOUNTER — Other Ambulatory Visit: Payer: Self-pay

## 2021-12-14 DIAGNOSIS — B351 Tinea unguium: Secondary | ICD-10-CM | POA: Diagnosis not present

## 2021-12-14 DIAGNOSIS — N186 End stage renal disease: Secondary | ICD-10-CM

## 2021-12-14 DIAGNOSIS — E1142 Type 2 diabetes mellitus with diabetic polyneuropathy: Secondary | ICD-10-CM

## 2021-12-14 DIAGNOSIS — M79676 Pain in unspecified toe(s): Secondary | ICD-10-CM | POA: Diagnosis not present

## 2021-12-14 DIAGNOSIS — I872 Venous insufficiency (chronic) (peripheral): Secondary | ICD-10-CM

## 2021-12-14 NOTE — Progress Notes (Signed)
This patient returns to my office for at risk foot care.  This patient requires this care by a professional since this patient will be at risk due to having diabetes mellitus and ESRD.  This patient is unable to cut nails himself since the patient cannot reach his nails.These nails are painful walking and wearing shoes.  This patient presents for at risk foot care today.  General Appearance  Alert, conversant and in no acute stress.  Vascular  Dorsalis pedis and posterior tibial  pulses are weakly  palpable due to swelling. bilaterally.  Capillary return is within normal limits  bilaterally. Temperature is within normal limits  Bilaterally.  Venous stasis  B/L.  Neurologic  Senn-Weinstein monofilament wire test within normal limits  bilaterally. Muscle power within normal limits bilaterally.  Nails Thick disfigured discolored nails with subungual debris  from hallux to fifth toes bilaterally. No evidence of bacterial infection or drainage bilaterally.  Orthopedic  No limitations of motion  feet .  No crepitus or effusions noted.  No bony pathology or digital deformities noted.  Skin  normotropic skin with no porokeratosis noted bilaterally.  No signs of infections or ulcers noted.  Punctate psoriasis plantar aspect feet  B/l.   Onychomycosis  Pain in right toes  Pain in left toes  Consent was obtained for treatment procedures.   Mechanical debridement of nails 1-5  bilaterally performed with a nail nipper.  Filed with dremel without incident.    Return office visit  10 weeks                   Told patient to return for periodic foot care and evaluation due to potential at risk complications.   Gardiner Barefoot DPM

## 2021-12-25 ENCOUNTER — Ambulatory Visit: Payer: Medicare Other | Admitting: Dermatology

## 2021-12-27 ENCOUNTER — Other Ambulatory Visit: Payer: Self-pay | Admitting: Urology

## 2021-12-28 ENCOUNTER — Ambulatory Visit: Payer: Commercial Managed Care - HMO | Admitting: Cardiology

## 2022-01-08 ENCOUNTER — Encounter: Payer: Self-pay | Admitting: Emergency Medicine

## 2022-01-08 ENCOUNTER — Inpatient Hospital Stay
Admission: EM | Admit: 2022-01-08 | Discharge: 2022-01-14 | DRG: 689 | Disposition: A | Discharge status: Home / Self Care | Payer: Medicare Other | Attending: Internal Medicine | Admitting: Internal Medicine

## 2022-01-08 ENCOUNTER — Emergency Department: Payer: Medicare Other

## 2022-01-08 ENCOUNTER — Other Ambulatory Visit: Payer: Self-pay

## 2022-01-08 DIAGNOSIS — N10 Acute tubulo-interstitial nephritis: Principal | ICD-10-CM | POA: Diagnosis present

## 2022-01-08 DIAGNOSIS — I4901 Ventricular fibrillation: Secondary | ICD-10-CM | POA: Diagnosis present

## 2022-01-08 DIAGNOSIS — Z6841 Body Mass Index (BMI) 40.0 and over, adult: Secondary | ICD-10-CM | POA: Diagnosis not present

## 2022-01-08 DIAGNOSIS — E1169 Type 2 diabetes mellitus with other specified complication: Secondary | ICD-10-CM | POA: Diagnosis present

## 2022-01-08 DIAGNOSIS — Z7984 Long term (current) use of oral hypoglycemic drugs: Secondary | ICD-10-CM

## 2022-01-08 DIAGNOSIS — I132 Hypertensive heart and chronic kidney disease with heart failure and with stage 5 chronic kidney disease, or end stage renal disease: Secondary | ICD-10-CM | POA: Diagnosis present

## 2022-01-08 DIAGNOSIS — N186 End stage renal disease: Secondary | ICD-10-CM | POA: Diagnosis present

## 2022-01-08 DIAGNOSIS — I700 Atherosclerosis of aorta: Secondary | ICD-10-CM | POA: Diagnosis present

## 2022-01-08 DIAGNOSIS — Z91013 Allergy to seafood: Secondary | ICD-10-CM

## 2022-01-08 DIAGNOSIS — I2510 Atherosclerotic heart disease of native coronary artery without angina pectoris: Secondary | ICD-10-CM | POA: Diagnosis present

## 2022-01-08 DIAGNOSIS — Z7982 Long term (current) use of aspirin: Secondary | ICD-10-CM

## 2022-01-08 DIAGNOSIS — B9689 Other specified bacterial agents as the cause of diseases classified elsewhere: Secondary | ICD-10-CM | POA: Diagnosis present

## 2022-01-08 DIAGNOSIS — Z841 Family history of disorders of kidney and ureter: Secondary | ICD-10-CM

## 2022-01-08 DIAGNOSIS — E1165 Type 2 diabetes mellitus with hyperglycemia: Secondary | ICD-10-CM | POA: Diagnosis present

## 2022-01-08 DIAGNOSIS — Z794 Long term (current) use of insulin: Secondary | ICD-10-CM

## 2022-01-08 DIAGNOSIS — L409 Psoriasis, unspecified: Secondary | ICD-10-CM | POA: Diagnosis present

## 2022-01-08 DIAGNOSIS — E119 Type 2 diabetes mellitus without complications: Secondary | ICD-10-CM

## 2022-01-08 DIAGNOSIS — Z8249 Family history of ischemic heart disease and other diseases of the circulatory system: Secondary | ICD-10-CM

## 2022-01-08 DIAGNOSIS — I272 Pulmonary hypertension, unspecified: Secondary | ICD-10-CM | POA: Diagnosis present

## 2022-01-08 DIAGNOSIS — Z992 Dependence on renal dialysis: Secondary | ICD-10-CM

## 2022-01-08 DIAGNOSIS — Z7985 Long-term (current) use of injectable non-insulin antidiabetic drugs: Secondary | ICD-10-CM

## 2022-01-08 DIAGNOSIS — K5900 Constipation, unspecified: Secondary | ICD-10-CM | POA: Diagnosis present

## 2022-01-08 DIAGNOSIS — Z8701 Personal history of pneumonia (recurrent): Secondary | ICD-10-CM

## 2022-01-08 DIAGNOSIS — G4733 Obstructive sleep apnea (adult) (pediatric): Secondary | ICD-10-CM | POA: Diagnosis present

## 2022-01-08 DIAGNOSIS — Z7902 Long term (current) use of antithrombotics/antiplatelets: Secondary | ICD-10-CM

## 2022-01-08 DIAGNOSIS — E1140 Type 2 diabetes mellitus with diabetic neuropathy, unspecified: Secondary | ICD-10-CM | POA: Diagnosis present

## 2022-01-08 DIAGNOSIS — Z888 Allergy status to other drugs, medicaments and biological substances status: Secondary | ICD-10-CM

## 2022-01-08 DIAGNOSIS — K219 Gastro-esophageal reflux disease without esophagitis: Secondary | ICD-10-CM | POA: Diagnosis present

## 2022-01-08 DIAGNOSIS — J449 Chronic obstructive pulmonary disease, unspecified: Secondary | ICD-10-CM | POA: Diagnosis present

## 2022-01-08 DIAGNOSIS — R109 Unspecified abdominal pain: Secondary | ICD-10-CM | POA: Diagnosis not present

## 2022-01-08 DIAGNOSIS — I472 Ventricular tachycardia, unspecified: Secondary | ICD-10-CM | POA: Diagnosis present

## 2022-01-08 DIAGNOSIS — E1122 Type 2 diabetes mellitus with diabetic chronic kidney disease: Secondary | ICD-10-CM | POA: Diagnosis present

## 2022-01-08 DIAGNOSIS — D631 Anemia in chronic kidney disease: Secondary | ICD-10-CM | POA: Diagnosis present

## 2022-01-08 DIAGNOSIS — R809 Proteinuria, unspecified: Secondary | ICD-10-CM | POA: Diagnosis present

## 2022-01-08 DIAGNOSIS — Z79899 Other long term (current) drug therapy: Secondary | ICD-10-CM

## 2022-01-08 DIAGNOSIS — I5042 Chronic combined systolic (congestive) and diastolic (congestive) heart failure: Secondary | ICD-10-CM | POA: Diagnosis present

## 2022-01-08 DIAGNOSIS — Z9841 Cataract extraction status, right eye: Secondary | ICD-10-CM

## 2022-01-08 DIAGNOSIS — N400 Enlarged prostate without lower urinary tract symptoms: Secondary | ICD-10-CM | POA: Diagnosis present

## 2022-01-08 DIAGNOSIS — J9611 Chronic respiratory failure with hypoxia: Secondary | ICD-10-CM | POA: Diagnosis present

## 2022-01-08 DIAGNOSIS — I5022 Chronic systolic (congestive) heart failure: Secondary | ICD-10-CM | POA: Diagnosis not present

## 2022-01-08 DIAGNOSIS — E7800 Pure hypercholesterolemia, unspecified: Secondary | ICD-10-CM | POA: Diagnosis present

## 2022-01-08 DIAGNOSIS — I509 Heart failure, unspecified: Secondary | ICD-10-CM

## 2022-01-08 DIAGNOSIS — Z20822 Contact with and (suspected) exposure to covid-19: Secondary | ICD-10-CM | POA: Diagnosis present

## 2022-01-08 DIAGNOSIS — Z9981 Dependence on supplemental oxygen: Secondary | ICD-10-CM

## 2022-01-08 DIAGNOSIS — Z961 Presence of intraocular lens: Secondary | ICD-10-CM | POA: Diagnosis present

## 2022-01-08 DIAGNOSIS — E785 Hyperlipidemia, unspecified: Secondary | ICD-10-CM | POA: Diagnosis present

## 2022-01-08 DIAGNOSIS — N12 Tubulo-interstitial nephritis, not specified as acute or chronic: Principal | ICD-10-CM

## 2022-01-08 DIAGNOSIS — M109 Gout, unspecified: Secondary | ICD-10-CM | POA: Diagnosis present

## 2022-01-08 DIAGNOSIS — E875 Hyperkalemia: Secondary | ICD-10-CM | POA: Diagnosis present

## 2022-01-08 DIAGNOSIS — Z9842 Cataract extraction status, left eye: Secondary | ICD-10-CM

## 2022-01-08 LAB — BASIC METABOLIC PANEL
Anion gap: 11 (ref 5–15)
BUN: 29 mg/dL — ABNORMAL HIGH (ref 6–20)
CO2: 28 mmol/L (ref 22–32)
Calcium: 8.3 mg/dL — ABNORMAL LOW (ref 8.9–10.3)
Chloride: 96 mmol/L — ABNORMAL LOW (ref 98–111)
Creatinine, Ser: 7.2 mg/dL — ABNORMAL HIGH (ref 0.61–1.24)
GFR, Estimated: 8 mL/min — ABNORMAL LOW (ref >60)
Glucose, Bld: 184 mg/dL — ABNORMAL HIGH (ref 70–99)
Potassium: 3.9 mmol/L (ref 3.5–5.1)
Sodium: 135 mmol/L (ref 135–145)

## 2022-01-08 LAB — URINALYSIS, ROUTINE W REFLEX MICROSCOPIC
Glucose, UA: NEGATIVE mg/dL
Nitrite: NEGATIVE
Protein, ur: >300 mg/dL — AB
Specific Gravity, Urine: 1.015 (ref 1.005–1.030)
Squamous Epithelial / HPF: NONE SEEN (ref 0–5)
WBC, UA: >50 WBC/hpf — ABNORMAL HIGH (ref 0–5)
pH: 5 (ref 5.0–8.0)

## 2022-01-08 LAB — RESP PANEL BY RT-PCR (FLU A&B, COVID) ARPGX2
Influenza A by PCR: NEGATIVE
Influenza B by PCR: NEGATIVE
SARS Coronavirus 2 by RT PCR: NEGATIVE

## 2022-01-08 LAB — CBC
HCT: 38.1 % — ABNORMAL LOW (ref 39.0–52.0)
Hemoglobin: 12.2 g/dL — ABNORMAL LOW (ref 13.0–17.0)
MCH: 28.6 pg (ref 26.0–34.0)
MCHC: 32 g/dL (ref 30.0–36.0)
MCV: 89.2 fL (ref 80.0–100.0)
Platelets: 201 10*3/uL (ref 150–400)
RBC: 4.27 MIL/uL (ref 4.22–5.81)
RDW: 14.5 % (ref 11.5–15.5)
WBC: 10.7 10*3/uL — ABNORMAL HIGH (ref 4.0–10.5)
nRBC: 0 % (ref 0.0–0.2)

## 2022-01-08 LAB — TSH: TSH: 1.712 u[IU]/mL (ref 0.350–4.500)

## 2022-01-08 LAB — CBG MONITORING, ED: Glucose-Capillary: 150 mg/dL — ABNORMAL HIGH (ref 70–99)

## 2022-01-08 LAB — BRAIN NATRIURETIC PEPTIDE: B Natriuretic Peptide: 116.9 pg/mL — ABNORMAL HIGH (ref 0.0–100.0)

## 2022-01-08 MED ORDER — CALCIUM ACETATE (PHOS BINDER) 667 MG PO CAPS
2001.0000 mg | ORAL_CAPSULE | Freq: Three times a day (TID) | ORAL | Status: DC
  Administered 2022-01-09 – 2022-01-14 (×16): 2001 mg via ORAL
  Filled 2022-01-08 (×16): qty 3

## 2022-01-08 MED ORDER — HEPARIN SODIUM (PORCINE) 5000 UNIT/ML IJ SOLN
5000.0000 [IU] | Freq: Three times a day (TID) | INTRAMUSCULAR | Status: DC
  Administered 2022-01-09 – 2022-01-14 (×15): 5000 [IU] via SUBCUTANEOUS
  Filled 2022-01-08 (×15): qty 1

## 2022-01-08 MED ORDER — SODIUM CHLORIDE 0.9 % IV SOLN
1.0000 g | Freq: Once | INTRAVENOUS | Status: AC
  Administered 2022-01-08: 1 g via INTRAVENOUS
  Filled 2022-01-08: qty 10

## 2022-01-08 MED ORDER — ONDANSETRON HCL 4 MG/2ML IJ SOLN
4.0000 mg | Freq: Once | INTRAMUSCULAR | Status: AC
  Administered 2022-01-08: 4 mg via INTRAVENOUS
  Filled 2022-01-08: qty 2

## 2022-01-08 MED ORDER — DOCUSATE SODIUM 100 MG PO CAPS
100.0000 mg | ORAL_CAPSULE | Freq: Every day | ORAL | Status: DC
  Administered 2022-01-09 – 2022-01-14 (×6): 100 mg via ORAL
  Filled 2022-01-08 (×6): qty 1

## 2022-01-08 MED ORDER — MORPHINE SULFATE (PF) 4 MG/ML IV SOLN
4.0000 mg | Freq: Once | INTRAVENOUS | Status: AC
  Administered 2022-01-08: 4 mg via INTRAVENOUS
  Filled 2022-01-08: qty 1

## 2022-01-08 MED ORDER — ALLOPURINOL 100 MG PO TABS
200.0000 mg | ORAL_TABLET | Freq: Every day | ORAL | Status: DC
  Administered 2022-01-09 – 2022-01-14 (×6): 200 mg via ORAL
  Filled 2022-01-08 (×6): qty 2

## 2022-01-08 MED ORDER — ALBUTEROL SULFATE (2.5 MG/3ML) 0.083% IN NEBU: 2.5000 mg | INHALATION_SOLUTION | RESPIRATORY_TRACT | Status: DC | PRN

## 2022-01-08 MED ORDER — HYDRALAZINE HCL 20 MG/ML IJ SOLN: 5.0000 mg | Freq: Four times a day (QID) | INTRAMUSCULAR | Status: DC | PRN

## 2022-01-08 MED ORDER — PANTOPRAZOLE SODIUM 40 MG PO TBEC
40.0000 mg | DELAYED_RELEASE_TABLET | Freq: Every day | ORAL | Status: DC
  Administered 2022-01-09 – 2022-01-14 (×6): 40 mg via ORAL
  Filled 2022-01-08 (×6): qty 1

## 2022-01-08 MED ORDER — ASPIRIN EC 81 MG PO TBEC
81.0000 mg | DELAYED_RELEASE_TABLET | Freq: Every day | ORAL | Status: DC
  Administered 2022-01-09 – 2022-01-14 (×6): 81 mg via ORAL
  Filled 2022-01-08 (×6): qty 1

## 2022-01-08 MED ORDER — UMECLIDINIUM-VILANTEROL 62.5-25 MCG/ACT IN AEPB
1.0000 | INHALATION_SPRAY | Freq: Every day | RESPIRATORY_TRACT | Status: DC
  Administered 2022-01-09 – 2022-01-14 (×6): 1 via RESPIRATORY_TRACT
  Filled 2022-01-08: qty 14

## 2022-01-08 MED ORDER — CLOPIDOGREL BISULFATE 75 MG PO TABS
75.0000 mg | ORAL_TABLET | Freq: Every day | ORAL | Status: DC
  Administered 2022-01-09 – 2022-01-14 (×6): 75 mg via ORAL
  Filled 2022-01-08 (×6): qty 1

## 2022-01-08 MED ORDER — METOPROLOL SUCCINATE ER 50 MG PO TB24
25.0000 mg | ORAL_TABLET | Freq: Every day | ORAL | Status: DC
  Filled 2022-01-08: qty 1

## 2022-01-08 MED ORDER — ALBUTEROL SULFATE HFA 108 (90 BASE) MCG/ACT IN AERS: 2.0000 | INHALATION_SPRAY | RESPIRATORY_TRACT | Status: DC | PRN

## 2022-01-08 MED ORDER — VITAMIN D 25 MCG (1000 UNIT) PO TABS
2000.0000 [IU] | ORAL_TABLET | Freq: Every day | ORAL | Status: DC
  Administered 2022-01-09 – 2022-01-14 (×6): 2000 [IU] via ORAL
  Filled 2022-01-08 (×7): qty 2

## 2022-01-08 MED ORDER — MORPHINE SULFATE (PF) 4 MG/ML IV SOLN
4.0000 mg | Freq: Once | INTRAVENOUS | Status: AC
Start: 2022-01-08 — End: 2022-01-08
  Administered 2022-01-08: 4 mg via INTRAVENOUS
  Filled 2022-01-08: qty 1

## 2022-01-08 MED ORDER — GABAPENTIN 300 MG PO CAPS
300.0000 mg | ORAL_CAPSULE | Freq: Every day | ORAL | Status: DC
  Administered 2022-01-09 – 2022-01-13 (×6): 300 mg via ORAL
  Filled 2022-01-08 (×6): qty 1

## 2022-01-08 MED ORDER — SODIUM CHLORIDE 0.9 % IV SOLN: INTRAVENOUS | Status: DC

## 2022-01-08 MED ORDER — CYCLOBENZAPRINE HCL 10 MG PO TABS
5.0000 mg | ORAL_TABLET | Freq: Two times a day (BID) | ORAL | Status: DC | PRN
  Administered 2022-01-09: 08:00:00 5 mg via ORAL
  Filled 2022-01-08: qty 1

## 2022-01-08 MED ORDER — SODIUM CHLORIDE 0.9 % IV SOLN
1.0000 g | Freq: Once | INTRAVENOUS | Status: AC
  Administered 2022-01-09: 18:00:00 1 g via INTRAVENOUS
  Filled 2022-01-08: qty 10

## 2022-01-08 MED ORDER — ISOSORBIDE MONONITRATE ER 30 MG PO TB24
30.0000 mg | ORAL_TABLET | Freq: Every day | ORAL | Status: DC
Start: 2022-01-09 — End: 2022-01-14
  Administered 2022-01-09 – 2022-01-14 (×6): 30 mg via ORAL
  Filled 2022-01-08 (×7): qty 1

## 2022-01-08 MED ORDER — ATORVASTATIN CALCIUM 20 MG PO TABS
40.0000 mg | ORAL_TABLET | Freq: Every day | ORAL | Status: DC
  Administered 2022-01-09 – 2022-01-13 (×6): 40 mg via ORAL
  Filled 2022-01-08 (×6): qty 2

## 2022-01-08 MED ORDER — RENA-VITE PO TABS
1.0000 | ORAL_TABLET | Freq: Every day | ORAL | Status: DC
  Administered 2022-01-09 – 2022-01-14 (×6): 1 via ORAL
  Filled 2022-01-08 (×6): qty 1

## 2022-01-08 NOTE — ED Notes (Signed)
Patient refusing in and out cath. Corky Downs, MD aware

## 2022-01-08 NOTE — ED Triage Notes (Signed)
Pt to ED via [facility] wit c/o Left flank pain that began Saturday, he is a dialysis pt and is able to produce some urine and has not noticed any blood in the urine. He had a BM on Saturday and it was "real Hard". The pain takes his breath away, he is on 3L [place] chronically.

## 2022-01-08 NOTE — ED Provider Triage Note (Signed)
Emergency Medicine Provider Triage Evaluation Note  [name] , a 56 y.o. male  was evaluated in triage.  Pt complains of left flank pain. He presents via EMS from home with onset of symptoms that began on Saturday night.  He is on dialysis MWF, and completed dialysis today, and still produces some urine.  Patient would endorse some gross hematuria in his most recent urine samples.  Review of Systems  Positive: Left flank pain  Negative: FCS  Physical Exam  BP 116/77 (BP Location: Left Arm)    Pulse 95    Temp 98.2 F (36.8 C) (Oral)    Resp 17    Ht 5\' 3"  (1.6 m)    Wt 119.3 kg    SpO2 96%    BMI 46.59 kg/m  Gen:   Awake, no distress   Resp:  Normal effort  MSK:   Moves extremities without difficulty  Other:  CVS: RRR  Medical Decision Making  Medically screening exam initiated at 12:32 PM.  Appropriate orders placed.  [name] was informed that the remainder of the evaluation will be completed by another provider, this initial triage assessment does not replace that evaluation, and the importance of remaining in the ED until their evaluation is complete.  Dialysis patient presents to the ED with evaluation of left flank pain for 2 days.   Melvenia Needles, PA-C 01/08/22 1239

## 2022-01-08 NOTE — ED Notes (Signed)
Patient states his sugar was feeling low. Cbg checked at 150. Corky Downs, MD aware. Patient given sandwich tray and water.

## 2022-01-08 NOTE — H&P (Signed)
History and Physical    [name] DJM:[id number] DOB: 17-Mar-1966 DOA: 01/08/2022  PCP: [facility]    Patient coming from:  Home    Chief Complaint:  Left Flank pain.    HPI:  [name] is a 56 y.o. male seen in ed with complaints of pain in the left side , started Saturday am , while sleeping and , 10/10 and  NR, no nausea or vomiting, no fever or chills,he sat up from 10 minutes and he did that all day, could not sleep. No aggravating or alleviating factors. No hematuria  is negative.   Pt has past medical history of end-stage renal disease on hemodialysis, diabetes mellitus type 2, obesity. OSA on CPAP.  ED Course:   Vitals:   01/08/22 1630 01/08/22 1730 01/08/22 1800 01/08/22 1930  BP: 90/62 112/78 113/76 121/73  Pulse: 94 91 91 92  Resp: 18 18 18 18   Temp:      TempSrc:      SpO2: 93% 95% 97% 98%  Weight:      Height:      In ed pt is alert,awake and oriented and gives history.  Labs shows mild wbc count and cmp with creatinine of 7.20 with normal potassium and bicarb of 28 and AG of 11. U/A shows moderate hemoglobin large leukocytes more than 50 WBCs many bacteria, and CT imaging Bilateral perinephric stranding with asymmetric stranding about the left ureter and borderline to mild left hydronephrosis. No renal or ureteral calculi identified. Correlate for signs of upper tract infection.2. Aortic Atherosclerosis (ICD10-I70.0). In ed pt given rocephin and morphine for pain.   Review of Systems:  Review of Systems  Constitutional:  Positive for malaise/fatigue.  Gastrointestinal:  Positive for nausea.  Genitourinary:  Positive for flank pain.  All other systems reviewed and are negative.  Past Medical History:  Diagnosis Date   Anemia of chronic disease    Bell's palsy    Cataract    Chronic combined systolic (congestive) and diastolic (congestive) heart failure (HCC)    Chronic respiratory failure with hypoxia (HCC)    3 lpm  continuous   COPD (chronic obstructive pulmonary disease) (HCC)    Coronary artery disease    Diabetes mellitus without complication (HCC)    Dyspnea    Wheezing   ESRD on hemodialysis (HCC)    GERD (gastroesophageal reflux disease)    Gout    HOH (hard of hearing)    mild   Hypercholesterolemia    Hypertension    NSVT (nonsustained ventricular tachycardia)    Obstructive sleep apnea    CPAP, O2 use continuosly at 3LPM   Orthopnea    Pneumonia    In Past X2 most recent 1 yr ago   Psoriasis    Pulmonary HTN ([place])    Ventricular fibrillation [facility])     Past Surgical History:  Procedure Laterality Date   A/V FISTULAGRAM Right 05/02/2021   Procedure: A/V FISTULAGRAM;  Surgeon: Katha Cabal, MD;  Location: [place] [facility];  Service: Cardiovascular;  Laterality: Right;   AV FISTULA PLACEMENT Right 11/20/2019   Procedure: ARTERIOVENOUS (AV) FISTULA CREATION ( BRACHIAL CEPHALIC );  Surgeon: Katha Cabal, MD;  Location: [facility] ORS;  Service: Vascular;  Laterality: Right;   CATARACT EXTRACTION W/PHACO Left 12/17/2019   Procedure: CATARACT EXTRACTION PHACO AND INTRAOCULAR LENS PLACEMENT ([place]) LEFT ISTENT INJ DIABETIC;  Surgeon: Eulogio Bear, MD;  Location: [facility] ORS;  Service: Ophthalmology;  Laterality: Left;  [place] 00:33.2 CDE 2.96 Fluid Pack Lot # [id number]   CATARACT EXTRACTION W/PHACO Right 02/24/2020   Procedure: CATARACT EXTRACTION PHACO AND INTRAOCULAR LENS PLACEMENT (I[facility]) RIGHT DIABETIC ISTENT INJ;  Surgeon: Eulogio Bear, MD;  Location: [facility] OR[facility];  Service: Ophthalmology;  Laterality: Right;  [place] 00:37.3 CDE 2.88 Fluid Pack Lot # [id number]   CORONARY ANGIOPLASTY WITH STENT PLACEMENT     stent placement   CORONARY STENT INTERVENTION N/A 08/28/2021   Procedure: CORONARY STENT INTERVENTION;  Surgeon: Nelva Bush, MD;  Location: [place] [facility];  Service: Cardiovascular;  Laterality: N/A;   DIALYSIS/PERMA CATHETER INSERTION N/A 06/22/2019   Procedure:  DIALYSIS/PERMA CATHETER INSERTION;  Surgeon: Algernon Huxley, MD;  Location: [place] [facility];  Service: Cardiovascular;  Laterality: N/A;   DIALYSIS/PERMA CATHETER REMOVAL N/A 05/31/2020   Procedure: DIALYSIS/PERMA CATHETER REMOVAL;  Surgeon: Katha Cabal, MD;  Location: [place] [facility];  Service: Cardiovascular;  Laterality: N/A;   INSERTION OF AHMED VALVE Left 12/17/2019   Procedure: INSERTION OF iSTENT;  Surgeon: Eulogio Bear, MD;  Location: [facility] OR[facility];  Service: Ophthalmology;  Laterality: Left;   INTRAVASCULAR IMAGING/OCT N/A 08/28/2021   Procedure: INTRAVASCULAR IMAGING/OCT;  Surgeon: Nelva Bush, MD;  Location: [place] [facility];  Service: Cardiovascular;  Laterality: N/A;   LEFT HEART CATH AND CORONARY ANGIOGRAPHY N/A 08/08/2021   Procedure: LEFT HEART CATH AND CORONARY ANGIOGRAPHY;  Surgeon: Nelva Bush, MD;  Location: [place] [facility];  Service: Cardiovascular;  Laterality: N/A;     reports that he has never smoked. He has never used smokeless tobacco. He reports current alcohol use of about 1.0 standard drink per week. He reports that he does not currently use drugs after having used the following drugs: Marijuana.  Allergies  Allergen Reactions   Shellfish Allergy Anaphylaxis    Face and throat swelling, difficulty breathing Allergy can be triggered by touching (contact)   Betadine [Povidone Iodine] Rash   Povidone-Iodine Rash    Family History  Problem Relation Age of Onset   Heart failure Mother    Kidney failure Brother     Prior to Admission medications   Medication Sig Start Date End Date Taking? Authorizing Provider  albuterol (PROVENTIL HFA;VENTOLIN HFA) 108 (90 Base) MCG/ACT inhaler Inhale 2 puffs into the lungs every 4 (four) hours as needed for wheezing or shortness of breath. 01/20/19   Doles-Johnson, Teah, NP  allopurinol (ZYLOPRIM) 100 MG tablet Take 100 mg by mouth daily. 07/21/21   [provider]  ANORO ELLIPTA 62.5-25  MCG/INH AEPB INHALE 1 PUFF INTO THE LUNGS DAILY. 11/30/19   Kendell Bane, NP  aspirin EC 81 MG tablet Take 81 mg by mouth in the morning. Swallow whole.    [provider]  atorvastatin (LIPITOR) 40 MG tablet Take 1 tablet (40 mg total) by mouth at bedtime. 08/17/21   Dunn, Areta Haber, PA-C  calcium acetate (PHOSLO) 667 MG capsule Take 667 mg by mouth 3 (three) times daily with meals. 01/17/21   [provider]  Cholecalciferol (D3-1000) 25 MCG (1000 UT) tablet Take 2 tablets (2,000 Units total) by mouth daily. 01/20/19   Doles-Johnson, Teah, NP  clopidogrel (PLAVIX) 75 MG tablet Take 1 tablet (75 mg total) by mouth daily with breakfast. 11/27/21   Kate Sable, MD  cyclobenzaprine (FLEXERIL) 5 MG tablet Take 1 tablet (5 mg total) by mouth 2 (two) times daily as needed for muscle spasms. 01/31/21   Ezekiel Slocumb, DO  docusate sodium (COLACE)  100 MG capsule Take 1 capsule (100 mg total) by mouth daily. 09/05/21   Rise Mu, PA-C  gabapentin (NEURONTIN) 300 MG capsule Take 300 mg by mouth at bedtime. 06/15/21   [provider]  glimepiride (AMARYL) 4 MG tablet Take 4 mg by mouth daily with breakfast. 11/27/19   [provider]  insulin degludec (TRESIBA FLEXTOUCH) 100 UNIT/ML FlexTouch Pen Inject 20 Units into the skin at bedtime. 08/30/20   Lavera Guise, MD  isosorbide mononitrate (IMDUR) 30 MG 24 hr tablet Take 1 tablet (30 mg total) by mouth daily. 11/27/21 02/25/22  Kate Sable, MD  metoprolol succinate (TOPROL-XL) 25 MG 24 hr tablet Take 25 mg by mouth daily. 07/21/21   [provider]  Multiple Vitamins-Minerals (TAB-A-VITE) TABS TAKE 1 TABLET BY MOUTH ONCE DAILY. 02/19/20   Kendell Bane, NP  OXYGEN Inhale 3 L into the lungs daily.    [provider]  pantoprazole (PROTONIX) 40 MG tablet Take 1 tablet (40 mg total) by mouth daily. 11/27/21   Kate Sable, MD  Risankizumab-rzaa (SKYRIZI) 150 MG/ML SOSY Inject 150 mg into the  skin as directed. Every 12 weeks for maintenance. 11/09/20   Ralene Bathe, MD  sacubitril-valsartan (ENTRESTO) 49-51 MG Take 1 tablet by mouth 2 (two) times daily. STOP taking your Losartan and START taking Entresto. 10/13/21   Kate Sable, MD  silodosin (RAPAFLO) 4 MG CAPS capsule TAKE 1 CAPSULE ONCE DAILY WITH BREAKFAST 12/27/21   Abbie Sons, MD  torsemide 40 MG TABS Take TWO tablets (80 mg) TWICE daily only on days you DO NOT have dialysis: Tuesday, Thursday, Saturday and Sunday 10/24/21   Kate Sable, MD  triamcinolone (KENALOG) 0.025 % cream Apply 1 application topically daily in the afternoon.    [provider]  TRULICITY 3 WR/6.0AV SOPN Inject 3 mg into the skin every Thursday. 07/21/21   [provider]    Physical Exam: Vitals:   01/08/22 1630 01/08/22 1730 01/08/22 1800 01/08/22 1930  BP: 90/62 112/78 113/76 121/73  Pulse: 94 91 91 92  Resp: 18 18 18 18   Temp:      TempSrc:      SpO2: 93% 95% 97% 98%  Weight:      Height:       Physical Exam Vitals reviewed.  Constitutional:      General: He is not in acute distress.    Appearance: He is obese. He is not ill-appearing.  HENT:     Head: Normocephalic and atraumatic.     Right Ear: External ear normal.     Left Ear: External ear normal.     Nose: Nose normal.     Mouth/Throat:     Mouth: Mucous membranes are moist.  Eyes:     Extraocular Movements: Extraocular movements intact.     Pupils: Pupils are equal, round, and reactive to light.  Cardiovascular:     Rate and Rhythm: Normal rate and regular rhythm.     Pulses: Normal pulses.     Heart sounds: Normal heart sounds.  Pulmonary:     Effort: Pulmonary effort is normal.     Breath sounds: Normal breath sounds.  Abdominal:     General: Bowel sounds are normal. There is no distension.     Palpations: Abdomen is soft. There is no mass.     Tenderness: There is abdominal tenderness. There is no left CVA tenderness or guarding.      Hernia: No hernia is present.  Musculoskeletal:     Right lower leg: No edema.     Left lower leg: No edema.  Skin:    General: Skin is warm.  Neurological:     General: No focal deficit present.     Mental Status: He is alert and oriented to person, place, and time.  Psychiatric:        Mood and Affect: Mood normal.        Behavior: Behavior normal.     Labs on Admission: I have personally reviewed following labs and imaging studies No results for input(s): CKTOTAL, CKMB, TROPONINI in the last 72 hours. Lab Results  Component Value Date   WBC 10.7 (H) 01/08/2022   HGB 12.2 (L) 01/08/2022   HCT 38.1 (L) 01/08/2022   MCV 89.2 01/08/2022   PLT 201 01/08/2022    Recent Labs  Lab 01/08/22 1246  NA 135  K 3.9  CL 96*  CO2 28  BUN 29*  CREATININE 7.20*  CALCIUM 8.3*  GLUCOSE 184*   Lab Results  Component Value Date   CHOL 100 05/09/2021   HDL 24 (L) 05/09/2021   LDLCALC 53 05/09/2021   TRIG 117 05/09/2021   No results found for: DDIMER Invalid input(s): POCBNP   COVID-19 Labs No results for input(s): DDIMER, FERRITIN, LDH, CRP in the last 72 hours. Lab Results  Component Value Date   SARSCOV2NAA NEGATIVE 01/08/2022   [place] NEGATIVE 05/08/2021   [place] NEGATIVE 04/07/2021   [place] NEGATIVE 01/27/2021    Radiological Exams on Admission: CT Renal Stone Study  Result Date: 01/08/2022 CLINICAL DATA:  Left flank pain.  On dialysis.  Hematuria. EXAM: CT ABDOMEN AND PELVIS WITHOUT CONTRAST TECHNIQUE: Multidetector CT imaging of the abdomen and pelvis was performed following the standard protocol without IV contrast. RADIATION DOSE REDUCTION: This exam was performed according to the departmental dose-optimization program which includes automated exposure control, adjustment of the mA and/or kV according to patient size and/or use of iterative reconstruction technique. COMPARISON:  Renal ultrasound 06/16/2019 FINDINGS: Lower chest: Scarring or  atelectasis in the lung bases. Coronary atherosclerosis. No pleural effusion. Hepatobiliary: No focal liver abnormality is seen. No gallstones, gallbladder wall thickening, or biliary dilatation. Pancreas: No pancreatic ductal dilatation or acute inflammation. Suspected vascular calcification in the region of the pancreatic head. Spleen: Unremarkable. Adrenals/Urinary Tract: Unremarkable adrenal glands. Prominent symmetric bilateral perinephric stranding. Borderline to mild left hydronephrosis. Asymmetric stranding about the left ureter. No renal or ureteral calculi identified. Unremarkable bladder. Stomach/Bowel: The stomach is unremarkable. There is no evidence of bowel obstruction or inflammation. The appendix is unremarkable. Vascular/Lymphatic: Abdominal aortic atherosclerosis without aneurysm. No enlarged lymph nodes. Reproductive: Unremarkable prostate. 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 Other: No ascites or pneumoperitoneum. Small fat containing umbilical hernia. Musculoskeletal: No acute osseous abnormality or suspicious osseous lesion. Advanced lower lumbar facet arthrosis. IMPRESSION: 1. Bilateral perinephric stranding with asymmetric stranding about the left ureter and borderline to mild left hydronephrosis. No renal or ureteral calculi identified. Correlate for signs of upper tract infection. 2. Aortic Atherosclerosis (ICD10-I70.0). Electronically Signed   By: Logan Bores M.D.   On: 01/08/2022 16:08    EKG: Independently reviewed.  None.    Assessment/Plan: Principal Problem:   Acute left flank pain Active Problems:   Acute pyelonephritis   ESRD (end stage renal disease) on dialysis (HCC)   CAD (coronary artery disease)   HLD (hyperlipidemia)   CHF (congestive heart failure) (HCC)   COPD (  chronic obstructive pulmonary disease) (HCC)   Diabetes mellitus without complication (HCC)   Pyelonephritis   A/c left flank pain/  Pyelonephritis: We will continue rocephin and if dysuria and abd pain do not resolve will get urology consulted. Low rate KVO with HD.    ESRD on HD: Will consult dr. Candiss Norse.  Conti sensipar and   CAD/ HLD/ CHF: Cont aspirin/ start eliquis once pt is evaluated by gen surg , Cont asa and atorvastatin, plavix, metoprolol and imdur.  Volume management by HD -nephrology consult.   COPD: SpO2: 98 % O2 Flow Rate (L/min): 3 L/min Stable cont PRN albuterol MDI.  Continue anoro ellipta.  DM II: Ssi and glycemic protocol.   Pyelonephritis: Cultures collected in ed and waiting for message.    DVT prophylaxis:  Heparin   Code Status:  Full code    Family Communication:  Gibbs,Courtney (Daughter)  [phone] (Mobile)   Disposition Plan:  Home    Consults called:  Nephrology- Dr. Candiss Norse.  Admission status: Inpatient.    Medical Decision Making   Coding    Para Skeans MD [facility]  6 PM- 2 AM. Please contact me via secure Chat 6 PM-2 AM. To contact the [facility] Attending or Consulting provider [place] or covering provider during after hours [place], for this patient.   Check the care team in [facility] and look for a) attending/consulting [facility] provider listed and b) the [facility] team listed Log into [email or web address] and use [place]'s universal password to access. If you do not have the password, please contact the hospital operator. Locate the [facility] provider you are looking for under [facility] and page to a number that you can be directly reached. If you still have difficulty reaching the provider, please page the [facility] (Director on Call) for the Hospitalists listed on amion for assistance. [email or web address] 01/08/2022, 8:51 PM

## 2022-01-08 NOTE — ED Triage Notes (Signed)
First nurse Note:  ARrives via [facility].  C/O let flank pain x 2 days.  Difficulty taking a full breath due to left flank pain.[name] dialysis today.  VS:  NSR 122/89 97% -- 3l (home oxygen) 96

## 2022-01-08 NOTE — ED Notes (Signed)
Patient to CT.

## 2022-01-08 NOTE — ED Provider Notes (Signed)
[facility] Provider Note    Event Date/Time   First MD Initiated Contact with Patient 01/08/22 1519     (approximate)   History   Flank Pain   HPI  [name] is a 56 y.o. male with history of end-stage renal disease, CHF, COPD, diabetes, CAD who presents with complaints of left flank pain.  Patient describes the pain woke him up Saturday morning, he felt well Friday night.  He describes the pain is sharp in intensity without significant radiation.  No fevers chills.  He does have end-stage renal disease but does still make some urine.  He has not take anything for this.  He reports his urine has looked normal.  No fevers     Physical Exam   Triage Vital Signs: ED Triage Vitals  Enc Vitals Group     BP 01/08/22 1226 116/77     Pulse Rate 01/08/22 1226 95     Resp 01/08/22 1226 17     Temp 01/08/22 1226 98.2 F (36.8 C)     Temp Source 01/08/22 1226 Oral     SpO2 01/08/22 1226 96 %     Weight 01/08/22 1227 119.3 kg (263 lb 0.1 oz)     Height 01/08/22 1227 1.6 m (5\' 3" )     Head Circumference --      Peak Flow --      Pain Score 01/08/22 1227 8     Pain Loc --      Pain Edu? --      Excl. in [place]? --     Most recent vital signs: Vitals:   01/08/22 1730 01/08/22 1800  BP: 112/78 113/76  Pulse: 91 91  Resp: 18 18  Temp:    SpO2: 95% 97%     General: Awake, no distress.  CV:  Good peripheral perfusion.  Resp:  Normal effort.  Abd:  No distention.  Mild left CVA tenderness, mild left lower quadrant tenderness Other:     ED Results / Procedures / Treatments   Labs (all labs ordered are listed, but only abnormal results are displayed) Labs Reviewed  URINALYSIS, ROUTINE W REFLEX MICROSCOPIC - Abnormal; Notable for the following components:      Result Value   APPearance CLOUDY (*)    Hgb urine dipstick MODERATE (*)    Bilirubin Urine SMALL (*)    Ketones, ur TRACE (*)    Protein, ur >300 (*)    Leukocytes,Ua LARGE (*)     WBC, UA >50 (*)    Bacteria, UA MANY (*)    All other components within normal limits  BASIC METABOLIC PANEL - Abnormal; Notable for the following components:   Chloride 96 (*)    Glucose, Bld 184 (*)    BUN 29 (*)    Creatinine, Ser 7.20 (*)    Calcium 8.3 (*)    GFR, Estimated 8 (*)    All other components within normal limits  CBC - Abnormal; Notable for the following components:   WBC 10.7 (*)    Hemoglobin 12.2 (*)    HCT 38.1 (*)    All other components within normal limits  CBG MONITORING, ED - Abnormal; Notable for the following components:   Glucose-Capillary 150 (*)    All other components within normal limits  URINE CULTURE     EKG     RADIOLOGY CT renal stone study reviewed by me, no acute abnormality noted    PROCEDURES:  Critical  Care performed:   Procedures   MEDICATIONS ORDERED IN ED: Medications  morphine 4 MG/ML injection 4 mg (4 mg Intravenous Given 01/08/22 1612)  ondansetron (ZOFRAN) injection 4 mg (4 mg Intravenous Given 01/08/22 1611)  cefTRIAXone (ROCEPHIN) 1 g in sodium chloride 0.9 % 100 mL IVPB (0 g Intravenous Stopped 01/08/22 1943)  morphine 4 MG/ML injection 4 mg (4 mg Intravenous Given 01/08/22 1949)     IMPRESSION / MDM / ASSESSMENT AND PLAN / ED COURSE  I reviewed the triage vital signs and the nursing notes.  Patient with history of end-stage renal disease presents for left flank pain.  Differential includes ureterolithiasis, diverticulitis, colitis, less likely pyelonephritis.  We are attempting to collect urine  His BMP is overall unchanged from prior, chronically elevated creatinine of 7.2  CBC demonstrates mild elevation of white blood cell count which is nonspecific  Will send for CT renal stone study and treat with IV morphine, IV Zofran.  CT scan demonstrates stranding around left kidney with some mild hydro-, no evidence of stone  Discussed with urology Dr. Erlene Quan who recommends In-N-Out catheter to try to attempt  to get urine because the patient states that he cannot produce any on days when he has dialysis and potentially presumed treatment for pyelonephritis   The patient has refused in and out catheter.  Patient was able to produce urine, urinalysis consistent with infection, will give a dose of IV Rocephin.   Patient is complaining of 8 out of 10 pain again, will give an additional dose of morphine  Given evidence of pyelonephritis with continued pain and multiple comorbidities I will consult the hospitalist for admission        FINAL CLINICAL IMPRESSION(S) / ED DIAGNOSES   Final diagnoses:  Pyelonephritis     Rx / DC Orders   ED Discharge Orders     None        Note:  This document was prepared using Dragon voice recognition software and may include unintentional dictation errors.   [name], MD 01/08/22 [id number]

## 2022-01-09 ENCOUNTER — Telehealth (HOSPITAL_COMMUNITY): Payer: Self-pay | Admitting: Emergency Medicine

## 2022-01-09 DIAGNOSIS — E785 Hyperlipidemia, unspecified: Secondary | ICD-10-CM

## 2022-01-09 DIAGNOSIS — Z992 Dependence on renal dialysis: Secondary | ICD-10-CM

## 2022-01-09 DIAGNOSIS — N186 End stage renal disease: Secondary | ICD-10-CM

## 2022-01-09 DIAGNOSIS — J449 Chronic obstructive pulmonary disease, unspecified: Secondary | ICD-10-CM

## 2022-01-09 DIAGNOSIS — N10 Acute tubulo-interstitial nephritis: Principal | ICD-10-CM

## 2022-01-09 DIAGNOSIS — E1169 Type 2 diabetes mellitus with other specified complication: Secondary | ICD-10-CM

## 2022-01-09 DIAGNOSIS — I5022 Chronic systolic (congestive) heart failure: Secondary | ICD-10-CM

## 2022-01-09 LAB — HEMOGLOBIN A1C
Hgb A1c MFr Bld: 8.3 % — ABNORMAL HIGH (ref 4.8–5.6)
Mean Plasma Glucose: 192 mg/dL

## 2022-01-09 LAB — HIV ANTIBODY (ROUTINE TESTING W REFLEX): HIV Screen 4th Generation wRfx: NONREACTIVE

## 2022-01-09 LAB — GLUCOSE, CAPILLARY
Glucose-Capillary: 107 mg/dL — ABNORMAL HIGH (ref 70–99)
Glucose-Capillary: 188 mg/dL — ABNORMAL HIGH (ref 70–99)
Glucose-Capillary: 145 mg/dL — ABNORMAL HIGH (ref 70–99)
Glucose-Capillary: 201 mg/dL — ABNORMAL HIGH (ref 70–99)

## 2022-01-09 MED ORDER — ONDANSETRON HCL 4 MG/2ML IJ SOLN
4.0000 mg | Freq: Four times a day (QID) | INTRAMUSCULAR | Status: DC | PRN
  Administered 2022-01-09 – 2022-01-10 (×2): 4 mg via INTRAVENOUS
  Filled 2022-01-09 (×2): qty 2

## 2022-01-09 MED ORDER — TAMSULOSIN HCL 0.4 MG PO CAPS
0.4000 mg | ORAL_CAPSULE | Freq: Every day | ORAL | Status: DC
  Administered 2022-01-09 – 2022-01-13 (×4): 0.4 mg via ORAL
  Filled 2022-01-09 (×4): qty 1

## 2022-01-09 MED ORDER — INSULIN ASPART 100 UNIT/ML IJ SOLN
0.0000 [IU] | Freq: Three times a day (TID) | INTRAMUSCULAR | Status: DC
  Administered 2022-01-09 – 2022-01-13 (×7): 1 [IU] via SUBCUTANEOUS
  Filled 2022-01-09 (×7): qty 1

## 2022-01-09 MED ORDER — CHLORHEXIDINE GLUCONATE CLOTH 2 % EX PADS
6.0000 | MEDICATED_PAD | Freq: Every day | CUTANEOUS | Status: DC
  Administered 2022-01-10 – 2022-01-14 (×2): 6 via TOPICAL

## 2022-01-09 MED ORDER — HYDROMORPHONE HCL 1 MG/ML IJ SOLN
0.5000 mg | INTRAMUSCULAR | Status: DC | PRN
  Administered 2022-01-10 – 2022-01-11 (×5): 0.5 mg via INTRAVENOUS
  Filled 2022-01-09 (×5): qty 0.5

## 2022-01-09 MED ORDER — ONDANSETRON HCL 4 MG/2ML IJ SOLN
4.0000 mg | Freq: Once | INTRAMUSCULAR | Status: DC
  Filled 2022-01-09: qty 2

## 2022-01-09 MED ORDER — INSULIN ASPART 100 UNIT/ML IJ SOLN
0.0000 [IU] | Freq: Every day | INTRAMUSCULAR | Status: DC
  Administered 2022-01-09: 23:00:00 2 [IU] via SUBCUTANEOUS
  Filled 2022-01-09: qty 1

## 2022-01-09 MED ORDER — METOPROLOL SUCCINATE ER 25 MG PO TB24
25.0000 mg | ORAL_TABLET | Freq: Every day | ORAL | Status: DC
  Administered 2022-01-10 – 2022-01-14 (×5): 25 mg via ORAL
  Filled 2022-01-09 (×5): qty 1

## 2022-01-09 MED ORDER — OXYCODONE HCL 5 MG PO TABS
5.0000 mg | ORAL_TABLET | Freq: Four times a day (QID) | ORAL | Status: DC | PRN
Start: 2022-01-09 — End: 2022-01-14
  Administered 2022-01-09 – 2022-01-12 (×5): 5 mg via ORAL
  Filled 2022-01-09 (×5): qty 1

## 2022-01-09 MED ORDER — TRIAMCINOLONE ACETONIDE 0.5 % EX CREA
TOPICAL_CREAM | Freq: Two times a day (BID) | CUTANEOUS | Status: DC
  Administered 2022-01-10: 1 via TOPICAL
  Filled 2022-01-09 (×4): qty 15

## 2022-01-09 NOTE — Progress Notes (Signed)
[facility]  ROUNDING NOTE   Subjective:   [name] is a 56 year old African-American male with a past medical history including GERD, anemia, chronic combined systolic and diastolic heart failure, diabetes, CAD, hypertension, sleep apnea, and end-stage renal disease on hemodialysis.  Patient presents to the emergency department with complaints of left-sided abdominal pain persisting for 2 to 3 days.  Patient has been admitted for Pyelonephritis [N12]  Patient is known to our clinic and receives outpatient dialysis treatments at [facility] on a MWF schedule, supervised by Dr. Holley Raring.  He received a full treatment on Monday with no complications.  Patient states he was awoken out of his sleep on Saturday to a sharp pain on his left side.  States pain is always there with no alleviating factors.  Denies nausea, vomiting, and diarrhea.  Denies fever and chills.  Denies shortness of breath, remains on home baseline requirement of 3 L [place].  Labs on arrival indicate glucose 184, BUN 29, creatinine 7.2 with GFR 8, BNP 116.9, and WBCs 10.7.  Respiratory panel negative for influenza and COVID-19.  UA positive for hematuria, proteinuria and leukocytes.  CT renal stone shows bilateral perinephric stranding of the left ureter and borderline to mild left hydronephrosis without renal calculi.  We have been consulted to manage dialysis needs during this admission   Objective:  Vital signs in last 24 hours:  Temp:  [97.3 F (36.3 C)-98.3 F (36.8 C)] 98 F (36.7 C) (01/24 0814) Pulse Rate:  [91-108] 98 (01/24 0814) Resp:  [18-20] 18 (01/24 0523) BP: (90-138)/(62-119) 104/72 (01/24 0814) SpO2:  [92 %-98 %] 92 % (01/24 0814)  Weight change:  Filed Weights   01/08/22 1227  Weight: 119.3 kg    Intake/Output: I/O last 3 completed shifts: In: 100 [IV Piggyback:100] Out: -    Intake/Output this shift:  No intake/output data recorded.  Physical Exam: General: NAD, lying  in bed  Head: Normocephalic, atraumatic. Moist oral mucosal membranes  Eyes: Anicteric  Lungs:  Clear to auscultation, normal effort, 3 L [place]  Heart: Regular rate and rhythm  Abdomen:  Soft, left-sided tenderness, obese  Extremities: Trace peripheral edema.  Neurologic: Nonfocal, moving all four extremities  Skin: No lesions  Access: RUE AVF    Basic Metabolic Panel: Recent Labs  Lab 01/08/22 1246  NA 135  K 3.9  CL 96*  CO2 28  GLUCOSE 184*  BUN 29*  CREATININE 7.20*  CALCIUM 8.3*    Liver Function Tests: No results for input(s): AST, ALT, ALKPHOS, BILITOT, PROT, ALBUMIN in the last 168 hours. No results for input(s): LIPASE, AMYLASE in the last 168 hours. No results for input(s): AMMONIA in the last 168 hours.  CBC: Recent Labs  Lab 01/08/22 1246  WBC 10.7*  HGB 12.2*  HCT 38.1*  MCV 89.2  PLT 201    Cardiac Enzymes: No results for input(s): CKTOTAL, CKMB, CKMBINDEX, TROPONINI in the last 168 hours.  BNP: Invalid input(s): POCBNP  CBG: Recent Labs  Lab 01/08/22 1849 01/09/22 0813 01/09/22 1141  GLUCAP 150* 107* 188*    Microbiology: Results for orders placed or performed during the hospital encounter of 01/08/22  Resp Panel by RT-PCR (Flu A&B, Covid) Nasopharyngeal Swab     Status: None   Collection Time: 01/08/22  7:55 PM   Specimen: Nasopharyngeal Swab; Nasopharyngeal(NP) swabs in vial transport medium  Result Value Ref Range Status   SARS Coronavirus 2 by RT PCR NEGATIVE NEGATIVE Final    Comment: (NOTE)  SARS-CoV-2 target nucleic acids are NOT DETECTED.  The SARS-CoV-2 RNA is generally detectable in upper respiratory specimens during the acute phase of infection. The lowest concentration of SARS-CoV-2 viral copies this assay can detect is 138 copies/mL. A negative result does not preclude SARS-Cov-2 infection and should not be used as the sole basis for treatment or other patient management decisions. A negative result may occur with   improper specimen collection/handling, submission of specimen other than nasopharyngeal swab, presence of viral mutation(s) within the areas targeted by this assay, and inadequate number of viral copies(<138 copies/mL). A negative result must be combined with clinical observations, patient history, and epidemiological information. The expected result is Negative.  Fact Sheet for Patients:  EntrepreneurPulse.com.au  Fact Sheet for Healthcare Providers:  IncredibleEmployment.be  This test is no t yet approved or cleared by the [place] FDA and  has been authorized for detection and/or diagnosis of SARS-CoV-2 by FDA under an Emergency Use Authorization (EUA). This EUA will remain  in effect (meaning this test can be used) for the duration of the COVID-19 declaration under Section 564(b)(1) of the Act, 21 U.S.C.section 360bbb-3(b)(1), unless the authorization is terminated  or revoked sooner.       Influenza A by PCR NEGATIVE NEGATIVE Final   Influenza B by PCR NEGATIVE NEGATIVE Final    Comment: (NOTE) The Xpert Xpress SARS-CoV-2/FLU/RSV plus assay is intended as an aid in the diagnosis of influenza from Nasopharyngeal swab specimens and should not be used as a sole basis for treatment. Nasal washings and aspirates are unacceptable for Xpert Xpress SARS-CoV-2/FLU/RSV testing.  Fact Sheet for Patients: EntrepreneurPulse.com.au  Fact Sheet for Healthcare Providers: IncredibleEmployment.be  This test is not yet approved or cleared by the [place] FDA and has been authorized for detection and/or diagnosis of SARS-CoV-2 by FDA under an Emergency Use Authorization (EUA). This EUA will remain in effect (meaning this test can be used) for the duration of the COVID-19 declaration under Section 564(b)(1) of the Act, 21 U.S.C. section 360bbb-3(b)(1), unless the authorization is terminated  or revoked.  Performed at [facility], [address] [place], [place] [address]     Coagulation Studies: No results for input(s): LABPROT, INR in the last 72 hours.  Urinalysis: Recent Labs    01/08/22 1725  COLORURINE YELLOW  LABSPEC 1.015  PHURINE 5.0  GLUCOSEU NEGATIVE  HGBUR MODERATE*  BILIRUBINUR SMALL*  KETONESUR TRACE*  PROTEINUR >300*  NITRITE NEGATIVE  LEUKOCYTESUR LARGE*      Imaging: CT Renal Stone Study  Result Date: 01/08/2022 CLINICAL DATA:  Left flank pain.  On dialysis.  Hematuria. EXAM: CT ABDOMEN AND PELVIS WITHOUT CONTRAST TECHNIQUE: Multidetector CT imaging of the abdomen and pelvis was performed following the standard protocol without IV contrast. RADIATION DOSE REDUCTION: This exam was performed according to the departmental dose-optimization program which includes automated exposure control, adjustment of the mA and/or kV according to patient size and/or use of iterative reconstruction technique. COMPARISON:  Renal ultrasound 06/16/2019 FINDINGS: Lower chest: Scarring or atelectasis in the lung bases. Coronary atherosclerosis. No pleural effusion. Hepatobiliary: No focal liver abnormality is seen. No gallstones, gallbladder wall thickening, or biliary dilatation. Pancreas: No pancreatic ductal dilatation or acute inflammation. Suspected vascular calcification in the region of the pancreatic head. Spleen: Unremarkable. Adrenals/Urinary Tract: Unremarkable adrenal glands. Prominent symmetric bilateral perinephric stranding. Borderline to mild left hydronephrosis. Asymmetric stranding about the left ureter. No renal or ureteral calculi identified. Unremarkable bladder. Stomach/Bowel: The stomach is unremarkable. There is no  evidence of bowel obstruction or inflammation. The appendix is unremarkable. Vascular/Lymphatic: Abdominal aortic atherosclerosis without aneurysm. No enlarged lymph nodes. Reproductive: Unremarkable prostate. Penile prosthesis with  collapsed reservoir in the pelvis to the right of the bladder. Other: No ascites or pneumoperitoneum. Small fat containing umbilical hernia. Musculoskeletal: No acute osseous abnormality or suspicious osseous lesion. Advanced lower lumbar facet arthrosis. IMPRESSION: 1. Bilateral perinephric stranding with asymmetric stranding about the left ureter and borderline to mild left hydronephrosis. No renal or ureteral calculi identified. Correlate for signs of upper tract infection. 2. Aortic Atherosclerosis (ICD10-I70.0). Electronically Signed   By: Logan Bores M.D.   On: 01/08/2022 16:08     Medications:    cefTRIAXone (ROCEPHIN)  IV      allopurinol  200 mg Oral Daily   aspirin EC  81 mg Oral Daily   atorvastatin  40 mg Oral QHS   calcium acetate  2,001 mg Oral TID WC   cholecalciferol  2,000 Units Oral Daily   clopidogrel  75 mg Oral Q breakfast   docusate sodium  100 mg Oral Daily   gabapentin  300 mg Oral QHS   heparin  5,000 Units Subcutaneous Q8H   insulin aspart  0-5 Units Subcutaneous QHS   insulin aspart  0-6 Units Subcutaneous TID WC   isosorbide mononitrate  30 mg Oral Daily   metoprolol succinate  25 mg Oral Daily   multivitamin  1 tablet Oral Daily   ondansetron (ZOFRAN) IV  4 mg Intravenous Once   pantoprazole  40 mg Oral Daily   tamsulosin  0.4 mg Oral QPC supper   triamcinolone cream   Topical BID   umeclidinium-vilanterol  1 puff Inhalation Daily   albuterol, cyclobenzaprine, hydrALAZINE, HYDROmorphone (DILAUDID) injection, oxyCODONE  Assessment/ Plan:  [name] is a 56 y.o.  male ith a past medical history including GERD, anemia, chronic combined systolic and diastolic heart failure, diabetes, CAD, hypertension, sleep apnea, and end-stage renal disease on hemodialysis.  Patient presents to the emergency department with complaints of left-sided abdominal pain persisting for 2 to 3 days.  Patient has been admitted for Pyelonephritis [N12]  [facility] MWF [place]. Right AVF 120kg  End-stage renal disease on dialysis.  Will maintain outpatient schedule during this admission.  Last treatment received on Monday, prior to ED visit.  Next treatment scheduled for Wednesday with no to low UF.  2. Anemia of chronic kidney disease Lab Results  Component Value Date   HGB 12.2 (L) 01/08/2022    Hemoglobin remains at target.  We will hold ESA's for now  3. Secondary Hyperparathyroidism: Lab Results  Component Value Date   PTH 369 (H) 06/16/2019   CALCIUM 8.3 (L) 01/08/2022   CAION 1.11 (L) 02/24/2020   PHOS 7.9 (H) 05/16/2021    Calcium remains within acceptable range.  Phosphorus appears elevated.  Continue daily cholecalciferol and calcium acetate with meals.  4. Diabetes mellitus type II with chronic kidney disease: insulin dependent. Home regimen includes glipizide [place] and Trulicity. Most recent hemoglobin A1c is 7.1 on 05/09/21.     Primary team to manage sliding scale insulin.  5.  Acute pyelonephritis.  Perinephric stranding seen on CT renal stone and positive UA.  Urine culture pending.  Treating with Rocephin at this time.   LOS: 1 [place] 1/24/20232:18 PM

## 2022-01-09 NOTE — Plan of Care (Signed)

## 2022-01-09 NOTE — Telephone Encounter (Signed)
Attempted to call patient regarding upcoming cardiac MR appointment. Left message on voicemail with name and callback number Marchia Bond RN Navigator Cardiac Imaging [facility] Heart and Vascular Services [phone] Office [phone] Cell  Pt currently inpatient, may need to push appt back

## 2022-01-09 NOTE — Progress Notes (Addendum)
Patient ID: [name], male   DOB: 06-May-1966, 56 y.o.   MRN: [id number] [facility] PROGRESS NOTE  [name] IFO:[id number] DOB: [birth year] DOA: 01/08/2022 PCP: [facility] [place] [facility]  HPI/Subjective: Patient having some left flank pain described as a sharp pain.  Still having the pain.  Was given some morphine yesterday with some improvement.  Was recently started on a medication by urology to have his urine move through.  Admitted with pyelonephritis with bilateral perinephric stranding and a positive urine analysis.  Objective: Vitals:   01/09/22 0523 01/09/22 0814  BP: 102/70 104/72  Pulse: (!) 108 98  Resp: 18   Temp: (!) 97.3 F (36.3 C) 98 F (36.7 C)  SpO2: 96% 92%    Intake/Output Summary (Last 24 hours) at 01/09/2022 1337 Last data filed at 01/08/2022 1943 Gross per 24 hour  Intake 100 ml  Output --  Net 100 ml   Filed Weights   01/08/22 1227  Weight: 119.3 kg    ROS: Review of Systems  Respiratory:  Negative for shortness of breath.   Cardiovascular:  Negative for chest pain.  Gastrointestinal:  Positive for abdominal pain and nausea. Negative for vomiting.  Genitourinary:  Positive for flank pain.  Exam: Physical Exam HENT:     Head: Normocephalic.     Mouth/Throat:     Pharynx: No oropharyngeal exudate.  Eyes:     General: Lids are normal.     Conjunctiva/sclera: Conjunctivae normal.  Cardiovascular:     Rate and Rhythm: Regular rhythm. Tachycardia present.     Heart sounds: Normal heart sounds, S1 normal and S2 normal.  Pulmonary:     Breath sounds: No decreased breath sounds, wheezing, rhonchi or rales.  Abdominal:     Palpations: Abdomen is soft.     Tenderness: There is abdominal tenderness in the left upper quadrant. There is no right CVA tenderness or left CVA tenderness.  Musculoskeletal:     Right lower leg: Swelling present.     Left lower leg: Swelling present.  Skin:    General: Skin is warm.     Comments: Numerous  areas on his arms and legs with psoriasis  Neurological:     Mental Status: He is alert and oriented to person, place, and time.      Scheduled Meds:  allopurinol  200 mg Oral Daily   aspirin EC  81 mg Oral Daily   atorvastatin  40 mg Oral QHS   calcium acetate  2,001 mg Oral TID WC   cholecalciferol  2,000 Units Oral Daily   clopidogrel  75 mg Oral Q breakfast   docusate sodium  100 mg Oral Daily   gabapentin  300 mg Oral QHS   heparin  5,000 Units Subcutaneous Q8H   insulin aspart  0-5 Units Subcutaneous QHS   insulin aspart  0-6 Units Subcutaneous TID WC   isosorbide mononitrate  30 mg Oral Daily   metoprolol succinate  25 mg Oral Daily   multivitamin  1 tablet Oral Daily   ondansetron (ZOFRAN) IV  4 mg Intravenous Once   pantoprazole  40 mg Oral Daily   tamsulosin  0.4 mg Oral QPC supper   triamcinolone cream   Topical BID   umeclidinium-vilanterol  1 puff Inhalation Daily   Continuous Infusions:  sodium chloride 10 mL/hr at 01/09/22 0118   cefTRIAXone (ROCEPHIN)  IV     Brief history: 56 year old man with past medical history of end-stage renal disease, type 2 diabetes, COPD,  chronic systolic congestive heart failure, obstructive sleep apnea, pulmonary hypertension and anemia of chronic disease.  He presented 01/08/2022 with left-sided flank pain.  CT scan consistent with perinephric stranding and acute pyelonephritis.  No stone seen on CT scan.  Patient was started on antibiotics.  Assessment/Plan:  Acute pyelonephritis with perinephric stranding and positive urine analysis.  Await urine culture.  On empiric Rocephin.  We will check a bladder scan after urination. Left flank pain secondary to acute pyelonephritis.  No stone seen on CT scan.  Continue empiric antibiotics.  Add oral and IV pain medications as needed. End-stage renal disease.  Completed dialysis on Monday before coming into the hospital.  Next dialysis on Wednesday. COPD.  Respiratory status stable.  Continue  inhalers. History of CAD, chronic systolic congestive heart failure.  Patient on aspirin, Plavix, Toprol if blood pressure allow and Imdur.  The patient had a cardiac MRI ordered as outpatient.  MRI department trying to get things organized where they can do things here. Type 2 diabetes mellitus with hyperlipidemia.  Continue atorvastatin.  We will place on sliding scale insulin. BPH on Rapaflo as outpatient we will give Flomax while here. History of gout on allopurinol Diabetic neuropathy on gabapentin Psoriasis.  Triamcinolone cream        Code Status:     Code Status Orders  (From admission, onward)           Start     Ordered   01/08/22 2027  Full code  Continuous        01/08/22 2027           Code Status History     Date Active Date Inactive Code Status Order ID Comments User Context   08/28/2021 1626 08/29/2021 1834 Full Code [id number]  Nelva Bush, MD Inpatient   08/08/2021 1355 08/08/2021 2038 Full Code [id number]  Nelva Bush, MD Inpatient   05/08/2021 1745 05/19/2021 2324 Full Code [id number]  Flora Lipps, MD ED   05/08/2021 1211 05/08/2021 1745 Full Code [id number]  Ivor Costa, MD ED   01/27/2021 2326 01/31/2021 1757 Full Code [id number]  Mansy, Arvella Merles, MD ED   06/15/2019 1830 06/25/2019 1938 Full Code [id number]  Demetrios Loll, MD Inpatient   02/18/2019 1814 02/24/2019 1854 Full Code [id number]  Velna Ochs, MD ED   01/09/2018 1753 01/14/2018 1847 Full Code [id number]  Epifanio Lesches, MD ED   11/20/2016 2148 11/29/2016 1941 Full Code [id number]  Vaughan Basta, MD Inpatient      Disposition Plan: Status is: Inpatient  Patient admitted to the hospital yesterday with acute pyelonephritis.  Likely will need a few days of IV antibiotics.  Await urine culture results.  Antibiotics: Rocephin  [place]  [facility]

## 2022-01-10 ENCOUNTER — Ambulatory Visit (HOSPITAL_COMMUNITY): Payer: Medicare Other

## 2022-01-10 LAB — RENAL FUNCTION PANEL
Albumin: 3.5 g/dL (ref 3.5–5.0)
Anion gap: 13 (ref 5–15)
BUN: 64 mg/dL — ABNORMAL HIGH (ref 6–20)
CO2: 28 mmol/L (ref 22–32)
Calcium: 8.7 mg/dL — ABNORMAL LOW (ref 8.9–10.3)
Chloride: 91 mmol/L — ABNORMAL LOW (ref 98–111)
Creatinine, Ser: 13.39 mg/dL — ABNORMAL HIGH (ref 0.61–1.24)
GFR, Estimated: 4 mL/min — ABNORMAL LOW (ref >60)
Glucose, Bld: 159 mg/dL — ABNORMAL HIGH (ref 70–99)
Phosphorus: 7.5 mg/dL — ABNORMAL HIGH (ref 2.5–4.6)
Potassium: 7 mmol/L (ref 3.5–5.1)
Sodium: 132 mmol/L — ABNORMAL LOW (ref 135–145)

## 2022-01-10 LAB — CBC
HCT: 36.4 % — ABNORMAL LOW (ref 39.0–52.0)
Hemoglobin: 11.9 g/dL — ABNORMAL LOW (ref 13.0–17.0)
MCH: 30 pg (ref 26.0–34.0)
MCHC: 32.7 g/dL (ref 30.0–36.0)
MCV: 91.7 fL (ref 80.0–100.0)
Platelets: 208 10*3/uL (ref 150–400)
RBC: 3.97 MIL/uL — ABNORMAL LOW (ref 4.22–5.81)
RDW: 14.1 % (ref 11.5–15.5)
WBC: 11.7 10*3/uL — ABNORMAL HIGH (ref 4.0–10.5)
nRBC: 0 % (ref 0.0–0.2)

## 2022-01-10 LAB — GLUCOSE, CAPILLARY
Glucose-Capillary: 151 mg/dL — ABNORMAL HIGH (ref 70–99)
Glucose-Capillary: 184 mg/dL — ABNORMAL HIGH (ref 70–99)
Glucose-Capillary: 132 mg/dL — ABNORMAL HIGH (ref 70–99)

## 2022-01-10 LAB — HEPATITIS B SURFACE ANTIGEN: Hepatitis B Surface Ag: NONREACTIVE

## 2022-01-10 MED ORDER — HEPARIN SODIUM (PORCINE) 1000 UNIT/ML DIALYSIS: 1000.0000 [IU] | INTRAMUSCULAR | Status: DC | PRN

## 2022-01-10 MED ORDER — SODIUM CHLORIDE 0.9 % IV SOLN: 100.0000 mL | INTRAVENOUS | Status: DC | PRN

## 2022-01-10 MED ORDER — LIDOCAINE-PRILOCAINE 2.5-2.5 % EX CREA
1.0000 "application " | TOPICAL_CREAM | CUTANEOUS | Status: DC | PRN
  Filled 2022-01-10 (×2): qty 5

## 2022-01-10 MED ORDER — LIDOCAINE HCL (PF) 1 % IJ SOLN
5.0000 mL | INTRAMUSCULAR | Status: DC | PRN
  Filled 2022-01-10: qty 5

## 2022-01-10 MED ORDER — SODIUM CHLORIDE 0.9 % IV SOLN
1.0000 g | INTRAVENOUS | Status: DC
  Filled 2022-01-10 (×2): qty 10

## 2022-01-10 MED ORDER — ALTEPLASE 2 MG IJ SOLR: 2.0000 mg | Freq: Once | INTRAMUSCULAR | Status: DC | PRN

## 2022-01-10 MED ORDER — LIDOCAINE-PRILOCAINE 2.5-2.5 % EX CREA
1.0000 "application " | TOPICAL_CREAM | CUTANEOUS | Status: DC | PRN
  Administered 2022-01-10: 1 via TOPICAL
  Filled 2022-01-10: qty 30

## 2022-01-10 MED ORDER — PENTAFLUOROPROP-TETRAFLUOROETH EX AERO
1.0000 "application " | INHALATION_SPRAY | CUTANEOUS | Status: DC | PRN
  Filled 2022-01-10: qty 30

## 2022-01-10 NOTE — Progress Notes (Signed)
Lab called with critical lab values.  Dr. Gwyndolyn Saxon notified

## 2022-01-10 NOTE — Progress Notes (Signed)
°   01/09/22 2047  Assess: MEWS Score  Temp 97.8 F (36.6 C)  BP 99/70  Pulse Rate (!) 106  Level of Consciousness Alert  SpO2 93 %  O2 Device Nasal Cannula  O2 Flow Rate (L/min) 3 L/min  Assess: MEWS Score  MEWS Temp 0  MEWS Systolic 1  MEWS Pulse 1  MEWS RR 0  MEWS LOC 0  MEWS Score 2  MEWS Score Color Yellow  Assess: if the MEWS score is Yellow or Red  Were vital signs taken at a resting state? Yes  Focused Assessment Change from prior assessment (see assessment flowsheet)  Does the patient meet 2 or more of the SIRS criteria? No  MEWS guidelines implemented *See Row Information* Yes  Treat  Pain Scale 0-10  Pain Score 0  Complains of Nausea /  Vomiting  Take Vital Signs  Increase Vital Sign Frequency  Yellow: Q 2hr X 2 then Q 4hr X 2, if remains yellow, continue Q 4hrs  Escalate  MEWS: Escalate Yellow: discuss with charge nurse/RN and consider discussing with provider and RRT  Notify: Charge Nurse/RN  Name of Charge Nurse/RN Notified Stacy Clay,RN  Date Charge Nurse/RN Notified 01/09/22  Time Charge Nurse/RN Notified 2100  Document  Patient Outcome Stabilized after interventions  Assess: SIRS CRITERIA  SIRS Temperature  0  SIRS Pulse 1  SIRS Respirations  0  SIRS WBC 0  SIRS Score Sum  1

## 2022-01-10 NOTE — Progress Notes (Signed)
° °  Dr. Candiss Norse approved clearance for gadolinum administration for cardiac MRI on the condition that the patient get HD afterward. I suggested that the patient have CMR appt on the afternoon of an 'off' day, so that he can make it to his HD session the following morning. Patient recieves HD on MWF 1st shift at the [facility] on [facility] I have discussed this plan with their clinical coordinator, Hinton Dyer.  Marchia Bond RN Navigator Cardiac Imaging [facility] Heart and Vascular Services [phone] Office  [phone] Cell

## 2022-01-10 NOTE — Progress Notes (Signed)
Hd completed. Tolerated well. Total tx time 3 hours . Zero UF net removal. Pt asymptomatic

## 2022-01-10 NOTE — Progress Notes (Signed)
[facility]  ROUNDING NOTE   Subjective:   [name] is a 56 year old African-American male with a past medical history including GERD, anemia, chronic combined systolic and diastolic heart failure, diabetes, CAD, hypertension, sleep apnea, and end-stage renal disease on hemodialysis.  Patient presents to the emergency department with complaints of left-sided abdominal pain persisting for 2 to 3 days.  Patient has been admitted for Pyelonephritis [N12]  Patient is known to our clinic and receives outpatient dialysis treatments at [facility] on a MWF schedule, supervised by Dr. Holley Raring.    Patient seen resting quietly, alert but grimacing Continues to complain of nausea with mild abdominal pain Unable to tolerate meals due to nausea  Plan to dialyze later today  Patient seen later in morning sitting at side of bed, appears extremely drowsy.  Encourage patient to lie in bed to prevent falling.   Objective:  Vital signs in last 24 hours:  Temp:  [97.6 F (36.4 C)-99.5 F (37.5 C)] 98.6 F (37 C) (01/25 1245) Pulse Rate:  [97-115] 113 (01/25 1245) Resp:  [16-18] 18 (01/25 1258) BP: (99-134)/(63-77) 132/74 (01/25 1245) SpO2:  [91 %-95 %] 91 % (01/25 1245) Weight:  [120.4 kg] 120.4 kg (01/25 0500)  Weight change: 1.1 kg Filed Weights   01/08/22 1227 01/10/22 0500  Weight: 119.3 kg 120.4 kg    Intake/Output: I/O last 3 completed shifts: In: 580 [P.O.:480; IV Piggyback:100] Out: 20 [Urine:20]   Intake/Output this shift:  No intake/output data recorded.  Physical Exam: General: NAD, lying in bed, ill-appearing  Head: Normocephalic, atraumatic. Moist oral mucosal membranes  Eyes: Anicteric  Lungs:  Clear to auscultation, normal effort, 3 L [place]  Heart: Regular rate and rhythm  Abdomen:  Soft, left-sided tenderness, obese  Extremities: Trace peripheral edema.  Neurologic: Nonfocal, moving all four extremities  Skin: No lesions  Access: RUE AVF     Basic Metabolic Panel: Recent Labs  Lab 01/08/22 1246  NA 135  K 3.9  CL 96*  CO2 28  GLUCOSE 184*  BUN 29*  CREATININE 7.20*  CALCIUM 8.3*     Liver Function Tests: No results for input(s): AST, ALT, ALKPHOS, BILITOT, PROT, ALBUMIN in the last 168 hours. No results for input(s): LIPASE, AMYLASE in the last 168 hours. No results for input(s): AMMONIA in the last 168 hours.  CBC: Recent Labs  Lab 01/08/22 1246  WBC 10.7*  HGB 12.2*  HCT 38.1*  MCV 89.2  PLT 201     Cardiac Enzymes: No results for input(s): CKTOTAL, CKMB, CKMBINDEX, TROPONINI in the last 168 hours.  BNP: Invalid input(s): POCBNP  CBG: Recent Labs  Lab 01/09/22 1141 01/09/22 1622 01/09/22 2047 01/10/22 0810 01/10/22 1156  GLUCAP 188* 145* 201* 151* 184*     Microbiology: Results for orders placed or performed during the hospital encounter of 01/08/22  Urine Culture     Status: Abnormal (Preliminary result)   Collection Time: 01/08/22  5:25 PM   Specimen: Urine, Clean Catch  Result Value Ref Range Status   Specimen Description   Final    URINE, CLEAN CATCH Performed at [facility], [address] [place], [place] [address]    Special Requests   Final    NONE Performed at [facility], [address], [place], [place] [address]    Culture >=100,000 COLONIES/mL GRAM NEGATIVE RODS (A)  Final   Report Status PENDING  Incomplete  Resp Panel by RT-PCR (Flu A&B, Covid) Nasopharyngeal Swab  Status: None   Collection Time: 01/08/22  7:55 PM   Specimen: Nasopharyngeal Swab; Nasopharyngeal(NP) swabs in vial transport medium  Result Value Ref Range Status   SARS Coronavirus 2 by RT PCR NEGATIVE NEGATIVE Final    Comment: (NOTE) SARS-CoV-2 target nucleic acids are NOT DETECTED.  The SARS-CoV-2 RNA is generally detectable in upper respiratory specimens during the acute phase of infection. The lowest concentration of SARS-CoV-2 viral copies this assay can detect  is 138 copies/mL. A negative result does not preclude SARS-Cov-2 infection and should not be used as the sole basis for treatment or other patient management decisions. A negative result may occur with  improper specimen collection/handling, submission of specimen other than nasopharyngeal swab, presence of viral mutation(s) within the areas targeted by this assay, and inadequate number of viral copies(<138 copies/mL). A negative result must be combined with clinical observations, patient history, and epidemiological information. The expected result is Negative.  Fact Sheet for Patients:  EntrepreneurPulse.com.au  Fact Sheet for Healthcare Providers:  IncredibleEmployment.be  This test is no t yet approved or cleared by the [place] FDA and  has been authorized for detection and/or diagnosis of SARS-CoV-2 by FDA under an Emergency Use Authorization (EUA). This EUA will remain  in effect (meaning this test can be used) for the duration of the COVID-19 declaration under Section 564(b)(1) of the Act, 21 U.S.C.section 360bbb-3(b)(1), unless the authorization is terminated  or revoked sooner.       Influenza A by PCR NEGATIVE NEGATIVE Final   Influenza B by PCR NEGATIVE NEGATIVE Final    Comment: (NOTE) The Xpert Xpress SARS-CoV-2/FLU/RSV plus assay is intended as an aid in the diagnosis of influenza from Nasopharyngeal swab specimens and should not be used as a sole basis for treatment. Nasal washings and aspirates are unacceptable for Xpert Xpress SARS-CoV-2/FLU/RSV testing.  Fact Sheet for Patients: EntrepreneurPulse.com.au  Fact Sheet for Healthcare Providers: IncredibleEmployment.be  This test is not yet approved or cleared by the [place] FDA and has been authorized for detection and/or diagnosis of SARS-CoV-2 by FDA under an Emergency Use Authorization (EUA). This EUA will remain in effect  (meaning this test can be used) for the duration of the COVID-19 declaration under Section 564(b)(1) of the Act, 21 U.S.C. section 360bbb-3(b)(1), unless the authorization is terminated or revoked.  Performed at [facility], [address] [place], [place] [address]     Coagulation Studies: No results for input(s): LABPROT, INR in the last 72 hours.  Urinalysis: Recent Labs    01/08/22 1725  COLORURINE YELLOW  LABSPEC 1.015  PHURINE 5.0  GLUCOSEU NEGATIVE  HGBUR MODERATE*  BILIRUBINUR SMALL*  KETONESUR TRACE*  PROTEINUR >300*  NITRITE NEGATIVE  LEUKOCYTESUR LARGE*       Imaging: CT Renal Stone Study  Result Date: 01/08/2022 CLINICAL DATA:  Left flank pain.  On dialysis.  Hematuria. EXAM: CT ABDOMEN AND PELVIS WITHOUT CONTRAST TECHNIQUE: Multidetector CT imaging of the abdomen and pelvis was performed following the standard protocol without IV contrast. RADIATION DOSE REDUCTION: This exam was performed according to the departmental dose-optimization program which includes automated exposure control, adjustment of the mA and/or kV according to patient size and/or use of iterative reconstruction technique. COMPARISON:  Renal ultrasound 06/16/2019 FINDINGS: Lower chest: Scarring or atelectasis in the lung bases. Coronary atherosclerosis. No pleural effusion. Hepatobiliary: No focal liver abnormality is seen. No gallstones, gallbladder wall thickening, or biliary dilatation. Pancreas: No pancreatic ductal dilatation or acute inflammation. Suspected vascular calcification in the  region of the pancreatic head. Spleen: Unremarkable. Adrenals/Urinary Tract: Unremarkable adrenal glands. Prominent symmetric bilateral perinephric stranding. Borderline to mild left hydronephrosis. Asymmetric stranding about the left ureter. No renal or ureteral calculi identified. Unremarkable bladder. Stomach/Bowel: The stomach is unremarkable. There is no evidence of bowel obstruction or  inflammation. The appendix is unremarkable. Vascular/Lymphatic: Abdominal aortic atherosclerosis without aneurysm. No enlarged lymph nodes. Reproductive: Unremarkable prostate. 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 Other: No ascites or pneumoperitoneum. Small fat containing umbilical hernia. Musculoskeletal: No acute osseous abnormality or suspicious osseous lesion. Advanced lower lumbar facet arthrosis. IMPRESSION: 1. Bilateral perinephric stranding with asymmetric stranding about the left ureter and borderline to mild left hydronephrosis. No renal or ureteral calculi identified. Correlate for signs of upper tract infection. 2. Aortic Atherosclerosis (ICD10-I70.0). Electronically Signed   By: Logan Bores M.D.   On: 01/08/2022 16:08     Medications:      allopurinol  200 mg Oral Daily   aspirin EC  81 mg Oral Daily   atorvastatin  40 mg Oral QHS   calcium acetate  2,001 mg Oral TID WC   Chlorhexidine Gluconate Cloth  6 each Topical Q0600   cholecalciferol  2,000 Units Oral Daily   clopidogrel  75 mg Oral Q breakfast   docusate sodium  100 mg Oral Daily   gabapentin  300 mg Oral QHS   heparin  5,000 Units Subcutaneous Q8H   insulin aspart  0-5 Units Subcutaneous QHS   insulin aspart  0-6 Units Subcutaneous TID WC   isosorbide mononitrate  30 mg Oral Daily   metoprolol succinate  25 mg Oral Daily   multivitamin  1 tablet Oral Daily   ondansetron (ZOFRAN) IV  4 mg Intravenous Once   pantoprazole  40 mg Oral Daily   tamsulosin  0.4 mg Oral QPC supper   triamcinolone cream   Topical BID   umeclidinium-vilanterol  1 puff Inhalation Daily   albuterol, cyclobenzaprine, hydrALAZINE, HYDROmorphone (DILAUDID) injection, lidocaine-prilocaine, ondansetron (ZOFRAN) IV, oxyCODONE  Assessment/ Plan:  [name] is a 56 y.o.  male ith a past medical history including GERD, anemia, chronic combined systolic and diastolic heart failure,  diabetes, CAD, hypertension, sleep apnea, and end-stage renal disease on hemodialysis.  Patient presents to the emergency department with complaints of left-sided abdominal pain persisting for 2 to 3 days.  Patient has been admitted for Pyelonephritis [N12]  [facility] MWF [facility]. Right AVF 120kg  End-stage renal disease on dialysis.  Will maintain outpatient schedule during this admission.  Last treatment received on Monday, prior to ED visit.  Plan to dialyze patient today to maintain outpatient schedule.  No UF  2. Anemia of chronic kidney disease Lab Results  Component Value Date   HGB 12.2 (L) 01/08/2022    Hemoglobin at target.  We will hold ESA's   3. Secondary Hyperparathyroidism: Lab Results  Component Value Date   PTH 369 (H) 06/16/2019   CALCIUM 8.3 (L) 01/08/2022   CAION 1.11 (L) 02/24/2020   PHOS 7.9 (H) 05/16/2021    Calcium remains within acceptable range.  Will order updated phosphorus with labs today. continue daily cholecalciferol and calcium acetate with meals.  4. Diabetes mellitus type II with chronic kidney disease: insulin dependent. Home regimen includes glipizide [place] and Trulicity. Most recent hemoglobin A1c is 7.1 on 05/09/21.     Primary team to manage sliding scale insulin.  5.  Acute pyelonephritis.  Perinephric stranding  seen on CT renal stone and positive UA.  Urine culture with gram-negative rods.  Treating with Rocephin at this time.   LOS: 2   1/25/20232:42 PM

## 2022-01-10 NOTE — Progress Notes (Signed)
PROGRESS NOTE    [name]  WNI:[id number] DOB: 04/04/1966 DOA: 01/08/2022 PCP: [facility]   Assessment & Plan:   Principal Problem:   Acute left flank pain Active Problems:   CAD (coronary artery disease)   ESRD (end stage renal disease) on dialysis (HCC)   Diabetes mellitus without complication (HCC)   CHF (congestive heart failure) (HCC)   COPD (chronic obstructive pulmonary disease) (HCC)   HLD (hyperlipidemia)   Type 2 diabetes mellitus with hyperlipidemia ([place])   Acute pyelonephritis   Pyelonephritis   Acute pyelonephritis: No stone seen on CT scan. Continue on IV rocephin. Urine cx growing gram neg rods, sens pending   ESRD: on HD. Management as per nephro   COPD: w/o exacerbation. Continue on bronchodilators  Hx of CAD: continue on aspirin, plavix, imdur  Chronic systolic CHF: continue on metoprolol, imdur, statin. Appears euvolemic. Monitor I/Os. Had a cardiac MRI ordered as outpatient.  DM2: likely poorly controlled. Continue on SSI w/ accuchecks  HLD: continue on statin   BPH: continue on flomax   Hx of gout: continue on home dose of allopurinol  Diabetic neuropathy: continue on home dose of gabapentin  Psoriasis: continue on triamcinolone cream  Morbid obesity: BMI 45.6. Complicates overall care & prognosis    DVT prophylaxis: heparin  Code Status: full  Family Communication:  Disposition Plan: likely d/c back home   Level of care: Telemetry Medical  Status is: Inpatient  Remains inpatient appropriate because: severity of illness    Consultants:  Nephro   Procedures:   Antimicrobials: rocephin    Subjective: Pt c/o itching   Objective: Vitals:   01/10/22 1529 01/10/22 1530 01/10/22 1534 01/10/22 1545  BP:  138/79    Pulse:  (!) 104    Resp: 13 18 16 14   Temp:      TempSrc:      SpO2: 97% 95%    Weight: 116.8 kg     Height:        Intake/Output Summary (Last 24 hours) at 01/10/2022 1556 Last data filed  at 01/09/2022 1820 Gross per 24 hour  Intake 240 ml  Output 20 ml  Net 220 ml   Filed Weights   01/08/22 1227 01/10/22 0500 01/10/22 1529  Weight: 119.3 kg 120.4 kg 116.8 kg    Examination:  General exam: Appears calm and comfortable  Respiratory system: Clear to auscultation. Respiratory effort normal. Cardiovascular system: S1 & S2 +. No rubs, gallops or clicks.  Gastrointestinal system: Abdomen is obese, soft and nontender. Normal bowel sounds heard. Central nervous system: Alert and oriented. Moves all extremities  Psychiatry: Judgement and insight appear normal. Flat mood and affect    Data Reviewed: I have personally reviewed following labs and imaging studies  CBC: Recent Labs  Lab 01/08/22 1246  WBC 10.7*  HGB 12.2*  HCT 38.1*  MCV 89.2  PLT 381   Basic Metabolic Panel: Recent Labs  Lab 01/08/22 1246  NA 135  K 3.9  CL 96*  CO2 28  GLUCOSE 184*  BUN 29*  CREATININE 7.20*  CALCIUM 8.3*   GFR: Estimated Creatinine Clearance: 13.6 mL/min (A) (by C-G formula based on SCr of 7.2 mg/dL (H)). Liver Function Tests: No results for input(s): AST, ALT, ALKPHOS, BILITOT, PROT, ALBUMIN in the last 168 hours. No results for input(s): LIPASE, AMYLASE in the last 168 hours. No results for input(s): AMMONIA in the last 168 hours. Coagulation Profile: No results for input(s): INR, PROTIME in the last  168 hours. Cardiac Enzymes: No results for input(s): CKTOTAL, CKMB, CKMBINDEX, TROPONINI in the last 168 hours. BNP (last 3 results) No results for input(s): PROBNP in the last 8760 hours. HbA1C: Recent Labs    01/09/22 0915  HGBA1C 8.3*   CBG: Recent Labs  Lab 01/09/22 1141 01/09/22 1622 01/09/22 2047 01/10/22 0810 01/10/22 1156  GLUCAP 188* 145* 201* 151* 184*   Lipid Profile: No results for input(s): CHOL, HDL, LDLCALC, TRIG, CHOLHDL, LDLDIRECT in the last 72 hours. Thyroid Function Tests: Recent Labs    01/08/22 1246  TSH 1.712   Anemia  Panel: No results for input(s): VITAMINB12, FOLATE, FERRITIN, TIBC, IRON, RETICCTPCT in the last 72 hours. Sepsis Labs: No results for input(s): PROCALCITON, LATICACIDVEN in the last 168 hours.  Recent Results (from the past 240 hour(s))  Urine Culture     Status: Abnormal (Preliminary result)   Collection Time: 01/08/22  5:25 PM   Specimen: Urine, Clean Catch  Result Value Ref Range Status   Specimen Description   Final    URINE, CLEAN CATCH Performed at [facility], [address], [place], [place] [address]    Special Requests   Final    NONE Performed at [facility], [address], [place], [place] [address]    Culture >=100,000 COLONIES/mL GRAM NEGATIVE RODS (A)  Final   Report Status PENDING  Incomplete  Resp Panel by RT-PCR (Flu A&B, Covid) Nasopharyngeal Swab     Status: None   Collection Time: 01/08/22  7:55 PM   Specimen: Nasopharyngeal Swab; Nasopharyngeal(NP) swabs in vial transport medium  Result Value Ref Range Status   SARS Coronavirus 2 by RT PCR NEGATIVE NEGATIVE Final    Comment: (NOTE) SARS-CoV-2 target nucleic acids are NOT DETECTED.  The SARS-CoV-2 RNA is generally detectable in upper respiratory specimens during the acute phase of infection. The lowest concentration of SARS-CoV-2 viral copies this assay can detect is 138 copies/mL. A negative result does not preclude SARS-Cov-2 infection and should not be used as the sole basis for treatment or other patient management decisions. A negative result may occur with  improper specimen collection/handling, submission of specimen other than nasopharyngeal swab, presence of viral mutation(s) within the areas targeted by this assay, and inadequate number of viral copies(<138 copies/mL). A negative result must be combined with clinical observations, patient history, and epidemiological information. The expected result is Negative.  Fact Sheet for Patients:   EntrepreneurPulse.com.au  Fact Sheet for Healthcare Providers:  IncredibleEmployment.be  This test is no t yet approved or cleared by the [place] FDA and  has been authorized for detection and/or diagnosis of SARS-CoV-2 by FDA under an Emergency Use Authorization (EUA). This EUA will remain  in effect (meaning this test can be used) for the duration of the COVID-19 declaration under Section 564(b)(1) of the Act, 21 U.S.C.section 360bbb-3(b)(1), unless the authorization is terminated  or revoked sooner.       Influenza A by PCR NEGATIVE NEGATIVE Final   Influenza B by PCR NEGATIVE NEGATIVE Final    Comment: (NOTE) The Xpert Xpress SARS-CoV-2/FLU/RSV plus assay is intended as an aid in the diagnosis of influenza from Nasopharyngeal swab specimens and should not be used as a sole basis for treatment. Nasal washings and aspirates are unacceptable for Xpert Xpress SARS-CoV-2/FLU/RSV testing.  Fact Sheet for Patients: EntrepreneurPulse.com.au  Fact Sheet for Healthcare Providers: IncredibleEmployment.be  This test is not yet approved or cleared by the [place] and has been authorized for  detection and/or diagnosis of SARS-CoV-2 by FDA under an Emergency Use Authorization (EUA). This EUA will remain in effect (meaning this test can be used) for the duration of the COVID-19 declaration under Section 564(b)(1) of the Act, 21 U.S.C. section 360bbb-3(b)(1), unless the authorization is terminated or revoked.  Performed at [facility], [address] [place],  [address]          Radiology Studies: CT Renal Stone Study  Result Date: 01/08/2022 CLINICAL DATA:  Left flank pain.  On dialysis.  Hematuria. EXAM: CT ABDOMEN AND PELVIS WITHOUT CONTRAST TECHNIQUE: Multidetector CT imaging of the abdomen and pelvis was performed following the standard protocol without IV contrast.  RADIATION DOSE REDUCTION: This exam was performed according to the departmental dose-optimization program which includes automated exposure control, adjustment of the mA and/or kV according to patient size and/or use of iterative reconstruction technique. COMPARISON:  Renal ultrasound 06/16/2019 FINDINGS: Lower chest: Scarring or atelectasis in the lung bases. Coronary atherosclerosis. No pleural effusion. Hepatobiliary: No focal liver abnormality is seen. No gallstones, gallbladder wall thickening, or biliary dilatation. Pancreas: No pancreatic ductal dilatation or acute inflammation. Suspected vascular calcification in the region of the pancreatic head. Spleen: Unremarkable. Adrenals/Urinary Tract: Unremarkable adrenal glands. Prominent symmetric bilateral perinephric stranding. Borderline to mild left hydronephrosis. Asymmetric stranding about the left ureter. No renal or ureteral calculi identified. Unremarkable bladder. Stomach/Bowel: The stomach is unremarkable. There is no evidence of bowel obstruction or inflammation. The appendix is unremarkable. Vascular/Lymphatic: Abdominal aortic atherosclerosis without aneurysm. No enlarged lymph nodes. Reproductive: Unremarkable prostate. 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 Other: No ascites or pneumoperitoneum. Small fat containing umbilical hernia. Musculoskeletal: No acute osseous abnormality or suspicious osseous lesion. Advanced lower lumbar facet arthrosis. IMPRESSION: 1. Bilateral perinephric stranding with asymmetric stranding about the left ureter and borderline to mild left hydronephrosis. No renal or ureteral calculi identified. Correlate for signs of upper tract infection. 2. Aortic Atherosclerosis (ICD10-I70.0). Electronically Signed   By: Logan Bores M.D.   On: 01/08/2022 16:08        Scheduled Meds:  allopurinol  200 mg Oral Daily   aspirin EC  81 mg Oral Daily   atorvastatin  40 mg Oral QHS    calcium acetate  2,001 mg Oral TID WC   Chlorhexidine Gluconate Cloth  6 each Topical Q0600   cholecalciferol  2,000 Units Oral Daily   clopidogrel  75 mg Oral Q breakfast   docusate sodium  100 mg Oral Daily   gabapentin  300 mg Oral QHS   heparin  5,000 Units Subcutaneous Q8H   insulin aspart  0-5 Units Subcutaneous QHS   insulin aspart  0-6 Units Subcutaneous TID WC   isosorbide mononitrate  30 mg Oral Daily   metoprolol succinate  25 mg Oral Daily   multivitamin  1 tablet Oral Daily   ondansetron (ZOFRAN) IV  4 mg Intravenous Once   pantoprazole  40 mg Oral Daily   tamsulosin  0.4 mg Oral QPC supper   triamcinolone cream   Topical BID   umeclidinium-vilanterol  1 puff Inhalation Daily   Continuous Infusions:  sodium chloride     sodium chloride     cefTRIAXone (ROCEPHIN)  IV       LOS: 2 days    Time spent: 19 mins     Wyvonnia Dusky, MD [facility] Pager [phone]  If 7PM-7AM, please contact night-coverage 01/10/2022, 3:56 PM

## 2022-01-11 ENCOUNTER — Ambulatory Visit (HOSPITAL_COMMUNITY): Admission: RE | Admit: 2022-01-11 | Payer: Medicaid Other | Source: Ambulatory Visit

## 2022-01-11 ENCOUNTER — Ambulatory Visit: Payer: Commercial Managed Care - HMO | Admitting: Dermatology

## 2022-01-11 DIAGNOSIS — E875 Hyperkalemia: Secondary | ICD-10-CM

## 2022-01-11 LAB — HEPATITIS B SURFACE ANTIBODY, QUANTITATIVE: Hep B S AB Quant (Post): 9.1 m[IU]/mL — ABNORMAL LOW (ref >9.9)

## 2022-01-11 LAB — GLUCOSE, CAPILLARY
Glucose-Capillary: 120 mg/dL — ABNORMAL HIGH (ref 70–99)
Glucose-Capillary: 171 mg/dL — ABNORMAL HIGH (ref 70–99)
Glucose-Capillary: 152 mg/dL — ABNORMAL HIGH (ref 70–99)
Glucose-Capillary: 137 mg/dL — ABNORMAL HIGH (ref 70–99)

## 2022-01-11 LAB — CBC
HCT: 37 % — ABNORMAL LOW (ref 39.0–52.0)
Hemoglobin: 11.2 g/dL — ABNORMAL LOW (ref 13.0–17.0)
MCH: 28.4 pg (ref 26.0–34.0)
MCHC: 30.3 g/dL (ref 30.0–36.0)
MCV: 93.7 fL (ref 80.0–100.0)
Platelets: 185 10*3/uL (ref 150–400)
RBC: 3.95 MIL/uL — ABNORMAL LOW (ref 4.22–5.81)
RDW: 13.9 % (ref 11.5–15.5)
WBC: 9.3 10*3/uL (ref 4.0–10.5)
nRBC: 0 % (ref 0.0–0.2)

## 2022-01-11 LAB — BASIC METABOLIC PANEL
Anion gap: 12 (ref 5–15)
BUN: 38 mg/dL — ABNORMAL HIGH (ref 6–20)
CO2: 28 mmol/L (ref 22–32)
Calcium: 9 mg/dL (ref 8.9–10.3)
Chloride: 92 mmol/L — ABNORMAL LOW (ref 98–111)
Creatinine, Ser: 9.95 mg/dL — ABNORMAL HIGH (ref 0.61–1.24)
GFR, Estimated: 6 mL/min — ABNORMAL LOW (ref >60)
Glucose, Bld: 133 mg/dL — ABNORMAL HIGH (ref 70–99)
Potassium: 5.4 mmol/L — ABNORMAL HIGH (ref 3.5–5.1)
Sodium: 132 mmol/L — ABNORMAL LOW (ref 135–145)

## 2022-01-11 LAB — URINE CULTURE: Culture: >=100000 — AB

## 2022-01-11 LAB — HEPATITIS B SURFACE ANTIBODY,QUALITATIVE: Hep B S Ab: NONREACTIVE

## 2022-01-11 MED ORDER — FUROSEMIDE 40 MG PO TABS
80.0000 mg | ORAL_TABLET | Freq: Once | ORAL | Status: AC
  Administered 2022-01-11: 80 mg via ORAL
  Filled 2022-01-11: qty 2

## 2022-01-11 MED ORDER — SODIUM CHLORIDE 0.9 % IV SOLN
1.0000 g | INTRAVENOUS | Status: DC
  Administered 2022-01-11 – 2022-01-13 (×3): 1 g via INTRAVENOUS
  Filled 2022-01-11 (×4): qty 1

## 2022-01-11 MED ORDER — DIPHENHYDRAMINE HCL 25 MG PO CAPS
25.0000 mg | ORAL_CAPSULE | Freq: Three times a day (TID) | ORAL | Status: DC | PRN
  Administered 2022-01-11 – 2022-01-13 (×3): 25 mg via ORAL
  Filled 2022-01-11 (×3): qty 1

## 2022-01-11 MED ORDER — POLYETHYLENE GLYCOL 3350 17 G PO PACK
17.0000 g | PACK | Freq: Every day | ORAL | Status: DC
  Administered 2022-01-11 – 2022-01-12 (×2): 17 g via ORAL
  Filled 2022-01-11 (×3): qty 1

## 2022-01-11 NOTE — Progress Notes (Addendum)
PROGRESS NOTE    [name]  NKN:[id number] DOB: 02-17-1966 DOA: 01/08/2022 PCP: [facility]   Assessment & Plan:   Principal Problem:   Acute left flank pain Active Problems:   CAD (coronary artery disease)   ESRD (end stage renal disease) on dialysis (HCC)   Diabetes mellitus without complication (HCC)   CHF (congestive heart failure) (HCC)   COPD (chronic obstructive pulmonary disease) (HCC)   HLD (hyperlipidemia)   Type 2 diabetes mellitus with hyperlipidemia ([place])   Acute pyelonephritis   Pyelonephritis   Acute pyelonephritis: No stone seen on CT scan. Changed abxs to IV cefepime. Urine cx is growing on enterobacter cloacae   ESRD: on HD. Management as per nephro   Chronic hypoxic respiratory failure: continue supplemental oxygen   Hyperkalemia: will be managed with HD   COPD: w/o exacerbation. Continue on bronchodilators  Hx of CAD: continue on imdur, plavix, & aspirin   Chronic systolic CHF: continue on metoprolol, imdur & statin. Volume management w/ HD. Monitor I/Os. Had a cardiac MRI ordered as outpatient.  DM2: HbA1c 8.3, poorly controlled. Continue on SSI w/ accuchecks   HLD: continue on statin   BPH: continue on flomax   Hx of gout: continue on home dose of allopurinol   Diabetic neuropathy: continue on home dose of gabapentin  Psoriasis: continue on triamcinolone cream  Morbid obesity: BMI 45.8. Complicates overall care & prognosis    DVT prophylaxis: heparin  Code Status: full  Family Communication:  Disposition Plan: likely d/c back home   Level of care: Telemetry Medical  Status is: Inpatient  Remains inpatient appropriate because: severity of illness    Consultants:  Nephro   Procedures:   Antimicrobials: cefepime    Subjective: Pt c/o flank pain   Objective: Vitals:   01/10/22 1945 01/11/22 0405 01/11/22 0500 01/11/22 0815  BP: 120/71 (!) 153/89  131/77  Pulse: (!) 107 98  (!) 107  Resp: 16 20  20    Temp: 98.9 F (37.2 C) 98.7 F (37.1 C)  98.2 F (36.8 C)  TempSrc: Oral Oral  Oral  SpO2: 93% 93%  96%  Weight:   120 kg   Height:        Intake/Output Summary (Last 24 hours) at 01/11/2022 0828 Last data filed at 01/11/2022 0516 Gross per 24 hour  Intake --  Output 0 ml  Net 0 ml   Filed Weights   01/10/22 1529 01/10/22 1857 01/11/22 0500  Weight: 116.8 kg 116 kg 120 kg    Examination:  General exam: Appears comfortable  Respiratory system: clear breath sounds b/l  Cardiovascular system: S1 & S2+. No rubs or clicks  Gastrointestinal system: Abd is soft, NT, obese & hypoactive bowel sounds Central nervous system: alert and oriented. Moves all extremities  Psychiatry: judgement and insight appears poor. Flat mood and affect    Data Reviewed: I have personally reviewed following labs and imaging studies  CBC: Recent Labs  Lab 01/08/22 1246 01/10/22 0842 01/11/22 0534  WBC 10.7* 11.7* 9.3  HGB 12.2* 11.9* 11.2*  HCT 38.1* 36.4* 37.0*  MCV 89.2 91.7 93.7  PLT 201 208 379   Basic Metabolic Panel: Recent Labs  Lab 01/08/22 1246 01/10/22 0842 01/11/22 0534  NA 135 132* 132*  K 3.9 7.0* 5.4*  CL 96* 91* 92*  CO2 28 28 28   GLUCOSE 184* 159* 133*  BUN 29* 64* 38*  CREATININE 7.20* 13.39* 9.95*  CALCIUM 8.3* 8.7* 9.0  PHOS  --  7.5*  --    GFR: Estimated Creatinine Clearance: 10 mL/min (A) (by C-G formula based on SCr of 9.95 mg/dL (H)). Liver Function Tests: Recent Labs  Lab 01/10/22 0842  ALBUMIN 3.5   No results for input(s): LIPASE, AMYLASE in the last 168 hours. No results for input(s): AMMONIA in the last 168 hours. Coagulation Profile: No results for input(s): INR, PROTIME in the last 168 hours. Cardiac Enzymes: No results for input(s): CKTOTAL, CKMB, CKMBINDEX, TROPONINI in the last 168 hours. BNP (last 3 results) No results for input(s): PROBNP in the last 8760 hours. HbA1C: Recent Labs    01/09/22 0915  HGBA1C 8.3*   CBG: Recent  Labs  Lab 01/09/22 2047 01/10/22 0810 01/10/22 1156 01/10/22 2104 01/11/22 0817  GLUCAP 201* 151* 184* 132* 120*   Lipid Profile: No results for input(s): CHOL, HDL, LDLCALC, TRIG, CHOLHDL, LDLDIRECT in the last 72 hours. Thyroid Function Tests: Recent Labs    01/08/22 1246  TSH 1.712   Anemia Panel: No results for input(s): VITAMINB12, FOLATE, FERRITIN, TIBC, IRON, RETICCTPCT in the last 72 hours. Sepsis Labs: No results for input(s): PROCALCITON, LATICACIDVEN in the last 168 hours.  Recent Results (from the past 240 hour(s))  Urine Culture     Status: Abnormal   Collection Time: 01/08/22  5:25 PM   Specimen: Urine, Clean Catch  Result Value Ref Range Status   Specimen Description   Final    URINE, CLEAN CATCH Performed at [facility], [address] [place], [place] [address]    Special Requests   Final    NONE Performed at [facility], [place], [place] [address]    Culture >=100,000 COLONIES/mL ENTEROBACTER CLOACAE (A)  Final   Report Status 01/11/2022 FINAL  Final   Organism ID, Bacteria ENTEROBACTER CLOACAE (A)  Final      Susceptibility   Enterobacter cloacae - MIC*    CEFAZOLIN RESISTANT Resistant     CEFEPIME <=0.12 SENSITIVE Sensitive     CIPROFLOXACIN <=0.25 SENSITIVE Sensitive     GENTAMICIN <=1 SENSITIVE Sensitive     IMIPENEM <=0.25 SENSITIVE Sensitive     NITROFURANTOIN 32 SENSITIVE Sensitive     TRIMETH/SULFA <=20 SENSITIVE Sensitive     PIP/TAZO <=4 SENSITIVE Sensitive     * >=100,000 COLONIES/mL ENTEROBACTER CLOACAE  Resp Panel by RT-PCR (Flu A&B, Covid) Nasopharyngeal Swab     Status: None   Collection Time: 01/08/22  7:55 PM   Specimen: Nasopharyngeal Swab; Nasopharyngeal(NP) swabs in vial transport medium  Result Value Ref Range Status   SARS Coronavirus 2 by RT PCR NEGATIVE NEGATIVE Final    Comment: (NOTE) SARS-CoV-2 target nucleic acids are NOT DETECTED.  The SARS-CoV-2 RNA is generally detectable  in upper respiratory specimens during the acute phase of infection. The lowest concentration of SARS-CoV-2 viral copies this assay can detect is 138 copies/mL. A negative result does not preclude SARS-Cov-2 infection and should not be used as the sole basis for treatment or other patient management decisions. A negative result may occur with  improper specimen collection/handling, submission of specimen other than nasopharyngeal swab, presence of viral mutation(s) within the areas targeted by this assay, and inadequate number of viral copies(<138 copies/mL). A negative result must be combined with clinical observations, patient history, and epidemiological information. The expected result is Negative.  Fact Sheet for Patients:  EntrepreneurPulse.com.au  Fact Sheet for Healthcare Providers:  IncredibleEmployment.be  This test is no t yet approved or cleared by the [place]  States FDA and  has been authorized for detection and/or diagnosis of SARS-CoV-2 by FDA under an Emergency Use Authorization (EUA). This EUA will remain  in effect (meaning this test can be used) for the duration of the COVID-19 declaration under Section 564(b)(1) of the Act, 21 U.S.C.section 360bbb-3(b)(1), unless the authorization is terminated  or revoked sooner.       Influenza A by PCR NEGATIVE NEGATIVE Final   Influenza B by PCR NEGATIVE NEGATIVE Final    Comment: (NOTE) The Xpert Xpress SARS-CoV-2/FLU/RSV plus assay is intended as an aid in the diagnosis of influenza from Nasopharyngeal swab specimens and should not be used as a sole basis for treatment. Nasal washings and aspirates are unacceptable for Xpert Xpress SARS-CoV-2/FLU/RSV testing.  Fact Sheet for Patients: EntrepreneurPulse.com.au  Fact Sheet for Healthcare Providers: IncredibleEmployment.be  This test is not yet approved or cleared by the [place] FDA and has  been authorized for detection and/or diagnosis of SARS-CoV-2 by FDA under an Emergency Use Authorization (EUA). This EUA will remain in effect (meaning this test can be used) for the duration of the COVID-19 declaration under Section 564(b)(1) of the Act, 21 U.S.C. section 360bbb-3(b)(1), unless the authorization is terminated or revoked.  Performed at [facility], [address] [place], [place] [address]          Radiology Studies: No results found.      Scheduled Meds:  allopurinol  200 mg Oral Daily   aspirin EC  81 mg Oral Daily   atorvastatin  40 mg Oral QHS   calcium acetate  2,001 mg Oral TID WC   Chlorhexidine Gluconate Cloth  6 each Topical Q0600   cholecalciferol  2,000 Units Oral Daily   clopidogrel  75 mg Oral Q breakfast   docusate sodium  100 mg Oral Daily   gabapentin  300 mg Oral QHS   heparin  5,000 Units Subcutaneous Q8H   insulin aspart  0-5 Units Subcutaneous QHS   insulin aspart  0-6 Units Subcutaneous TID WC   isosorbide mononitrate  30 mg Oral Daily   metoprolol succinate  25 mg Oral Daily   multivitamin  1 tablet Oral Daily   ondansetron (ZOFRAN) IV  4 mg Intravenous Once   pantoprazole  40 mg Oral Daily   tamsulosin  0.4 mg Oral QPC supper   triamcinolone cream   Topical BID   umeclidinium-vilanterol  1 puff Inhalation Daily   Continuous Infusions:  sodium chloride     sodium chloride     cefTRIAXone (ROCEPHIN)  IV       LOS: 3 days    Time spent: 33 mins     Wyvonnia Dusky, MD [facility] Pager [phone]  If 7PM-7AM, please contact night-coverage 01/11/2022, 8:28 AM

## 2022-01-11 NOTE — Progress Notes (Signed)
Mobility Specialist - Progress Note   01/11/22 1000  Mobility  Activity Transferred from bed to chair;Transferred from chair to bed;Dangled on edge of bed  Level of Assistance Standby assist, set-up cues, supervision of patient - no hands on  Assistive Device Front wheel walker  Distance Ambulated (ft) 4 ft  Activity Response Tolerated well  $Mobility charge 1 Mobility    Pt assisted to chair d/t soiled bed linen. Pt requests to return to bed after new linen provided. MinA to exit, supervision to stand. Dangled EOB for ~5 minutes with good sitting balance. Pt left in bed with alarm set, needs in reach.    Kathee Delton Mobility Specialist 01/11/22, 10:58 AM

## 2022-01-11 NOTE — Progress Notes (Signed)
[facility]  ROUNDING NOTE   Subjective:   [name] is a 56 year old African-American male with a past medical history including GERD, anemia, chronic combined systolic and diastolic heart failure, diabetes, CAD, hypertension, sleep apnea, and end-stage renal disease on hemodialysis.  Patient presents to the emergency department with complaints of left-sided abdominal pain persisting for 2 to 3 days.  Patient has been admitted for Pyelonephritis [N12]  Patient is known to our clinic and receives outpatient dialysis treatments at [facility] on a MWF schedule, supervised by Dr. Holley Raring.    Patient appears to be resting comfortably Breakfast tray at bedside RN and NT at bedside applying cream to psoriasis on legs Patient denies shortness of breath, remains on baseline oxygen requirement Tolerating meals without nausea or vomiting Continues to complain of abdominal pain but managed with prescribed medications.  Dialysis received yesterday, tolerated well   Objective:  Vital signs in last 24 hours:  Temp:  [98.2 F (36.8 C)-99.5 F (37.5 C)] 99.5 F (37.5 C) (01/26 1533) Pulse Rate:  [51-109] 99 (01/26 1533) Resp:  [11-21] 20 (01/26 1533) BP: (111-153)/(43-114) 127/82 (01/26 1533) SpO2:  [89 %-98 %] 95 % (01/26 1533) Weight:  [116 kg-120 kg] 120 kg (01/26 0500)  Weight change: -3.6 kg Filed Weights   01/10/22 1529 01/10/22 1857 01/11/22 0500  Weight: 116.8 kg 116 kg 120 kg    Intake/Output: No intake/output data recorded.   Intake/Output this shift:  Total I/O In: 360 [P.O.:360] Out: 150 [Urine:150]  Physical Exam: General: NAD, lying in bed  Head: Normocephalic, atraumatic. Moist oral mucosal membranes  Eyes: Anicteric  Lungs:  Clear to auscultation, normal effort, 3 L [place]  Heart: Regular rate and rhythm  Abdomen:  Soft, left-sided tenderness, obese  Extremities: Trace peripheral edema.  Neurologic: Nonfocal, moving all four extremities   Skin: Dry, scaly  Access: RUE AVF    Basic Metabolic Panel: Recent Labs  Lab 01/08/22 1246 01/10/22 0842 01/11/22 0534  NA 135 132* 132*  K 3.9 7.0* 5.4*  CL 96* 91* 92*  CO2 28 28 28   GLUCOSE 184* 159* 133*  BUN 29* 64* 38*  CREATININE 7.20* 13.39* 9.95*  CALCIUM 8.3* 8.7* 9.0  PHOS  --  7.5*  --      Liver Function Tests: Recent Labs  Lab 01/10/22 0842  ALBUMIN 3.5   No results for input(s): LIPASE, AMYLASE in the last 168 hours. No results for input(s): AMMONIA in the last 168 hours.  CBC: Recent Labs  Lab 01/08/22 1246 01/10/22 0842 01/11/22 0534  WBC 10.7* 11.7* 9.3  HGB 12.2* 11.9* 11.2*  HCT 38.1* 36.4* 37.0*  MCV 89.2 91.7 93.7  PLT 201 208 185     Cardiac Enzymes: No results for input(s): CKTOTAL, CKMB, CKMBINDEX, TROPONINI in the last 168 hours.  BNP: Invalid input(s): POCBNP  CBG: Recent Labs  Lab 01/10/22 0810 01/10/22 1156 01/10/22 2104 01/11/22 0817 01/11/22 1137  GLUCAP 151* 184* 132* 120* 171*     Microbiology: Results for orders placed or performed during the hospital encounter of 01/08/22  Urine Culture     Status: Abnormal   Collection Time: 01/08/22  5:25 PM   Specimen: Urine, Clean Catch  Result Value Ref Range Status   Specimen Description   Final    URINE, CLEAN CATCH Performed at [facility], [address] [place], [place] [address]    Special Requests   Final    NONE Performed at [facility], [phone] [place], [place] [address]    Culture >=100,000 COLONIES/mL ENTEROBACTER CLOACAE (A)  Final   Report Status 01/11/2022 FINAL  Final   Organism ID, Bacteria ENTEROBACTER CLOACAE (A)  Final      Susceptibility   Enterobacter cloacae - MIC*    CEFAZOLIN RESISTANT Resistant     CEFEPIME <=0.12 SENSITIVE Sensitive     CIPROFLOXACIN <=0.25 SENSITIVE Sensitive     GENTAMICIN <=1 SENSITIVE Sensitive     IMIPENEM <=0.25 SENSITIVE Sensitive     NITROFURANTOIN 32 SENSITIVE Sensitive      TRIMETH/SULFA <=20 SENSITIVE Sensitive     PIP/TAZO <=4 SENSITIVE Sensitive     * >=100,000 COLONIES/mL ENTEROBACTER CLOACAE  Resp Panel by RT-PCR (Flu A&B, Covid) Nasopharyngeal Swab     Status: None   Collection Time: 01/08/22  7:55 PM   Specimen: Nasopharyngeal Swab; Nasopharyngeal(NP) swabs in vial transport medium  Result Value Ref Range Status   SARS Coronavirus 2 by RT PCR NEGATIVE NEGATIVE Final    Comment: (NOTE) SARS-CoV-2 target nucleic acids are NOT DETECTED.  The SARS-CoV-2 RNA is generally detectable in upper respiratory specimens during the acute phase of infection. The lowest concentration of SARS-CoV-2 viral copies this assay can detect is 138 copies/mL. A negative result does not preclude SARS-Cov-2 infection and should not be used as the sole basis for treatment or other patient management decisions. A negative result may occur with  improper specimen collection/handling, submission of specimen other than nasopharyngeal swab, presence of viral mutation(s) within the areas targeted by this assay, and inadequate number of viral copies(<138 copies/mL). A negative result must be combined with clinical observations, patient history, and epidemiological information. The expected result is Negative.  Fact Sheet for Patients:  EntrepreneurPulse.com.au  Fact Sheet for Healthcare Providers:  IncredibleEmployment.be  This test is no t yet approved or cleared by the [place] FDA and  has been authorized for detection and/or diagnosis of SARS-CoV-2 by FDA under an Emergency Use Authorization (EUA). This EUA will remain  in effect (meaning this test can be used) for the duration of the COVID-19 declaration under Section 564(b)(1) of the Act, 21 U.S.C.section 360bbb-3(b)(1), unless the authorization is terminated  or revoked sooner.       Influenza A by PCR NEGATIVE NEGATIVE Final   Influenza B by PCR NEGATIVE NEGATIVE Final     Comment: (NOTE) The Xpert Xpress SARS-CoV-2/FLU/RSV plus assay is intended as an aid in the diagnosis of influenza from Nasopharyngeal swab specimens and should not be used as a sole basis for treatment. Nasal washings and aspirates are unacceptable for Xpert Xpress SARS-CoV-2/FLU/RSV testing.  Fact Sheet for Patients: EntrepreneurPulse.com.au  Fact Sheet for Healthcare Providers: IncredibleEmployment.be  This test is not yet approved or cleared by the [place] FDA and has been authorized for detection and/or diagnosis of SARS-CoV-2 by FDA under an Emergency Use Authorization (EUA). This EUA will remain in effect (meaning this test can be used) for the duration of the COVID-19 declaration under Section 564(b)(1) of the Act, 21 U.S.C. section 360bbb-3(b)(1), unless the authorization is terminated or revoked.  Performed at [facility], [address] [place], [place] [address]     Coagulation Studies: No results for input(s): LABPROT, INR in the last 72 hours.  Urinalysis: Recent Labs    01/08/22 1725  COLORURINE YELLOW  LABSPEC 1.015  PHURINE 5.0  GLUCOSEU NEGATIVE  HGBUR MODERATE*  BILIRUBINUR SMALL*  KETONESUR TRACE*  PROTEINUR >300*  NITRITE NEGATIVE  LEUKOCYTESUR LARGE*  Imaging: No results found.   Medications:    sodium chloride     sodium chloride     ceFEPime (MAXIPIME) IV 1 g (01/11/22 0930)     allopurinol  200 mg Oral Daily   aspirin EC  81 mg Oral Daily   atorvastatin  40 mg Oral QHS   calcium acetate  2,001 mg Oral TID WC   Chlorhexidine Gluconate Cloth  6 each Topical Q0600   cholecalciferol  2,000 Units Oral Daily   clopidogrel  75 mg Oral Q breakfast   docusate sodium  100 mg Oral Daily   gabapentin  300 mg Oral QHS   heparin  5,000 Units Subcutaneous Q8H   insulin aspart  0-5 Units Subcutaneous QHS   insulin aspart  0-6 Units Subcutaneous TID WC   isosorbide mononitrate  30  mg Oral Daily   metoprolol succinate  25 mg Oral Daily   multivitamin  1 tablet Oral Daily   ondansetron (ZOFRAN) IV  4 mg Intravenous Once   pantoprazole  40 mg Oral Daily   polyethylene glycol  17 g Oral Daily   tamsulosin  0.4 mg Oral QPC supper   triamcinolone cream   Topical BID   umeclidinium-vilanterol  1 puff Inhalation Daily   sodium chloride, sodium chloride, albuterol, alteplase, cyclobenzaprine, heparin, hydrALAZINE, HYDROmorphone (DILAUDID) injection, lidocaine (PF), lidocaine-prilocaine, ondansetron (ZOFRAN) IV, oxyCODONE, pentafluoroprop-tetrafluoroeth  Assessment/ Plan:  [name] is a 56 y.o.  male ith a past medical history including GERD, anemia, chronic combined systolic and diastolic heart failure, diabetes, CAD, hypertension, sleep apnea, and end-stage renal disease on hemodialysis.  Patient presents to the emergency department with complaints of left-sided abdominal pain persisting for 2 to 3 days.  Patient has been admitted for Pyelonephritis [N12]  [facility] MWF [place]. Right AVF 120kg  End-stage renal disease on dialysis.  Will maintain outpatient schedule during this admission.  Received dialysis yesterday with no UF.  Next treatment scheduled for Friday.  2. Anemia of chronic kidney disease Lab Results  Component Value Date   HGB 11.2 (L) 01/11/2022    Hemoglobin remains at target.  We will hold ESA's   3. Secondary Hyperparathyroidism: Lab Results  Component Value Date   PTH 369 (H) 06/16/2019   CALCIUM 9.0 01/11/2022   CAION 1.11 (L) 02/24/2020   PHOS 7.5 (H) 01/10/2022    Phosphorus remains elevated. Continue daily cholecalciferol and calcium acetate with meals.  4. Diabetes mellitus type II with chronic kidney disease: insulin dependent. Home regimen includes glipizide [place] and Trulicity. Most recent hemoglobin A1c is 7.1 on 05/09/21.     Primary team to manage sliding scale insulin.  5.  Acute pyelonephritis.  Perinephric  stranding seen on CT renal stone and positive UA.  Urine culture with gram-negative rods.  Treating with cefepime   LOS: 3   1/26/20234:08 PM

## 2022-01-11 NOTE — Care Management Important Message (Signed)
Important Message  Patient Details  Name: ESSIE GEHRET MRN: [id number] Date of Birth: [birth year]   Medicare Important Message Given:  Yes     Dannette Barbara 01/11/2022, 1:19 PM

## 2022-01-12 DIAGNOSIS — K5900 Constipation, unspecified: Secondary | ICD-10-CM

## 2022-01-12 LAB — GLUCOSE, CAPILLARY
Glucose-Capillary: 128 mg/dL — ABNORMAL HIGH (ref 70–99)
Glucose-Capillary: 174 mg/dL — ABNORMAL HIGH (ref 70–99)
Glucose-Capillary: 169 mg/dL — ABNORMAL HIGH (ref 70–99)

## 2022-01-12 LAB — BASIC METABOLIC PANEL
Anion gap: 13 (ref 5–15)
BUN: 54 mg/dL — ABNORMAL HIGH (ref 6–20)
CO2: 28 mmol/L (ref 22–32)
Calcium: 8.7 mg/dL — ABNORMAL LOW (ref 8.9–10.3)
Chloride: 90 mmol/L — ABNORMAL LOW (ref 98–111)
Creatinine, Ser: 12.44 mg/dL — ABNORMAL HIGH (ref 0.61–1.24)
GFR, Estimated: 4 mL/min — ABNORMAL LOW (ref >60)
Glucose, Bld: 133 mg/dL — ABNORMAL HIGH (ref 70–99)
Potassium: 5.7 mmol/L — ABNORMAL HIGH (ref 3.5–5.1)
Sodium: 131 mmol/L — ABNORMAL LOW (ref 135–145)

## 2022-01-12 LAB — CBC
HCT: 34.9 % — ABNORMAL LOW (ref 39.0–52.0)
Hemoglobin: 10.6 g/dL — ABNORMAL LOW (ref 13.0–17.0)
MCH: 28.3 pg (ref 26.0–34.0)
MCHC: 30.4 g/dL (ref 30.0–36.0)
MCV: 93.1 fL (ref 80.0–100.0)
Platelets: 189 10*3/uL (ref 150–400)
RBC: 3.75 MIL/uL — ABNORMAL LOW (ref 4.22–5.81)
RDW: 13.5 % (ref 11.5–15.5)
WBC: 8.4 10*3/uL (ref 4.0–10.5)
nRBC: 0 % (ref 0.0–0.2)

## 2022-01-12 MED ORDER — LACTULOSE 10 GM/15ML PO SOLN
20.0000 g | Freq: Two times a day (BID) | ORAL | Status: DC
  Administered 2022-01-12 (×2): 20 g via ORAL
  Filled 2022-01-12 (×2): qty 30

## 2022-01-12 NOTE — Progress Notes (Signed)
Patient completes a 3 hour treatment without incident. AVF cannulates with ease and maintains prescribed BFR. Targeted UF met with 1.5 liter fluid removal . No labs or medication given this treatment. Patient return to assigned room, report given.

## 2022-01-12 NOTE — Progress Notes (Signed)
[facility]  ROUNDING NOTE   Subjective:   [name] is a 56 year old African-American male with a past medical history including GERD, anemia, chronic combined systolic and diastolic heart failure, diabetes, CAD, hypertension, sleep apnea, and end-stage renal disease on hemodialysis.  Patient presents to the emergency department with complaints of left-sided abdominal pain persisting for 2 to 3 days.  Patient has been admitted for Pyelonephritis [N12]  Patient is known to our clinic and receives outpatient dialysis treatments at [facility] on a MWF schedule, supervised by Dr. Holley Raring.    Patient seen and evaluated during dialysis   HEMODIALYSIS FLOWSHEET:  Blood Flow Rate (mL/min): 400 mL/min Arterial Pressure (mmHg): -140 mmHg Venous Pressure (mmHg): 250 mmHg Transmembrane Pressure (mmHg): 70 mmHg Ultrafiltration Rate (mL/min): 137 mL/min Dialysate Flow Rate (mL/min): 500 ml/min Conductivity: Machine : 13.8 Conductivity: Machine : 13.8 Dialysis Fluid Bolus: Normal Saline Bolus Amount (mL): 250 mL Dialysate Change: 1K  No complaints at this time Continues to have abdominal pain, but states it is improved   Objective:  Vital signs in last 24 hours:  Temp:  [97.8 F (36.6 C)-99.5 F (37.5 C)] 97.8 F (36.6 C) (01/27 0948) Pulse Rate:  [91-106] 98 (01/27 1300) Resp:  [11-20] 14 (01/27 1300) BP: (105-142)/(68-100) 111/68 (01/27 1300) SpO2:  [89 %-99 %] 93 % (01/27 1300) Weight:  [119.4 kg] 119.4 kg (01/27 0948)  Weight change:  Filed Weights   01/10/22 1857 01/11/22 0500 01/12/22 0948  Weight: 116 kg 120 kg 119.4 kg    Intake/Output: I/O last 3 completed shifts: In: 460 [P.O.:360; IV Piggyback:100] Out: 250 [Urine:250]   Intake/Output this shift:  No intake/output data recorded.  Physical Exam: General: NAD, lying in bed  Head: Normocephalic, atraumatic. Moist oral mucosal membranes  Eyes: Anicteric  Lungs:  Clear to auscultation,  normal effort, 3 L [place]  Heart: Regular rate and rhythm  Abdomen:  Soft, left-sided tenderness, obese  Extremities: Trace peripheral edema.  Neurologic: Nonfocal, moving all four extremities  Skin: Dry, scaly  Access: RUE AVF    Basic Metabolic Panel: Recent Labs  Lab 01/08/22 1246 01/10/22 0842 01/11/22 0534 01/12/22 0532  NA 135 132* 132* 131*  K 3.9 7.0* 5.4* 5.7*  CL 96* 91* 92* 90*  CO2 28 28 28 28   GLUCOSE 184* 159* 133* 133*  BUN 29* 64* 38* 54*  CREATININE 7.20* 13.39* 9.95* 12.44*  CALCIUM 8.3* 8.7* 9.0 8.7*  PHOS  --  7.5*  --   --      Liver Function Tests: Recent Labs  Lab 01/10/22 0842  ALBUMIN 3.5    No results for input(s): LIPASE, AMYLASE in the last 168 hours. No results for input(s): AMMONIA in the last 168 hours.  CBC: Recent Labs  Lab 01/08/22 1246 01/10/22 0842 01/11/22 0534 01/12/22 0532  WBC 10.7* 11.7* 9.3 8.4  HGB 12.2* 11.9* 11.2* 10.6*  HCT 38.1* 36.4* 37.0* 34.9*  MCV 89.2 91.7 93.7 93.1  PLT 201 208 185 189     Cardiac Enzymes: No results for input(s): CKTOTAL, CKMB, CKMBINDEX, TROPONINI in the last 168 hours.  BNP: Invalid input(s): POCBNP  CBG: Recent Labs  Lab 01/11/22 0817 01/11/22 1137 01/11/22 1654 01/11/22 2146 01/12/22 0835  GLUCAP 120* 171* 152* 137* 128*     Microbiology: Results for orders placed or performed during the hospital encounter of 01/08/22  Urine Culture     Status: Abnormal   Collection Time: 01/08/22  5:25 PM   Specimen: Urine, Clean  Catch  Result Value Ref Range Status   Specimen Description   Final    URINE, CLEAN CATCH Performed at [facility], [address] [place], [place] [address]    Special Requests   Final    NONE Performed at [facility], [place], [place] [address]    Culture >=100,000 COLONIES/mL ENTEROBACTER CLOACAE (A)  Final   Report Status 01/11/2022 FINAL  Final   Organism ID, Bacteria ENTEROBACTER CLOACAE (A)  Final       Susceptibility   Enterobacter cloacae - MIC*    CEFAZOLIN RESISTANT Resistant     CEFEPIME <=0.12 SENSITIVE Sensitive     CIPROFLOXACIN <=0.25 SENSITIVE Sensitive     GENTAMICIN <=1 SENSITIVE Sensitive     IMIPENEM <=0.25 SENSITIVE Sensitive     NITROFURANTOIN 32 SENSITIVE Sensitive     TRIMETH/SULFA <=20 SENSITIVE Sensitive     PIP/TAZO <=4 SENSITIVE Sensitive     * >=100,000 COLONIES/mL ENTEROBACTER CLOACAE  Resp Panel by RT-PCR (Flu A&B, Covid) Nasopharyngeal Swab     Status: None   Collection Time: 01/08/22  7:55 PM   Specimen: Nasopharyngeal Swab; Nasopharyngeal(NP) swabs in vial transport medium  Result Value Ref Range Status   SARS Coronavirus 2 by RT PCR NEGATIVE NEGATIVE Final    Comment: (NOTE) SARS-CoV-2 target nucleic acids are NOT DETECTED.  The SARS-CoV-2 RNA is generally detectable in upper respiratory specimens during the acute phase of infection. The lowest concentration of SARS-CoV-2 viral copies this assay can detect is 138 copies/mL. A negative result does not preclude SARS-Cov-2 infection and should not be used as the sole basis for treatment or other patient management decisions. A negative result may occur with  improper specimen collection/handling, submission of specimen other than nasopharyngeal swab, presence of viral mutation(s) within the areas targeted by this assay, and inadequate number of viral copies(<138 copies/mL). A negative result must be combined with clinical observations, patient history, and epidemiological information. The expected result is Negative.  Fact Sheet for Patients:  EntrepreneurPulse.com.au  Fact Sheet for Healthcare Providers:  IncredibleEmployment.be  This test is no t yet approved or cleared by the [place] FDA and  has been authorized for detection and/or diagnosis of SARS-CoV-2 by FDA under an Emergency Use Authorization (EUA). This EUA will remain  in effect (meaning this test  can be used) for the duration of the COVID-19 declaration under Section 564(b)(1) of the Act, 21 U.S.C.section 360bbb-3(b)(1), unless the authorization is terminated  or revoked sooner.       Influenza A by PCR NEGATIVE NEGATIVE Final   Influenza B by PCR NEGATIVE NEGATIVE Final    Comment: (NOTE) The Xpert Xpress SARS-CoV-2/FLU/RSV plus assay is intended as an aid in the diagnosis of influenza from Nasopharyngeal swab specimens and should not be used as a sole basis for treatment. Nasal washings and aspirates are unacceptable for Xpert Xpress SARS-CoV-2/FLU/RSV testing.  Fact Sheet for Patients: EntrepreneurPulse.com.au  Fact Sheet for Healthcare Providers: IncredibleEmployment.be  This test is not yet approved or cleared by the [place] FDA and has been authorized for detection and/or diagnosis of SARS-CoV-2 by FDA under an Emergency Use Authorization (EUA). This EUA will remain in effect (meaning this test can be used) for the duration of the COVID-19 declaration under Section 564(b)(1) of the Act, 21 U.S.C. section 360bbb-3(b)(1), unless the authorization is terminated or revoked.  Performed at [facility], [address], [place], [place] [address]     Coagulation Studies: No  results for input(s): LABPROT, INR in the last 72 hours.  Urinalysis: No results for input(s): COLORURINE, LABSPEC, PHURINE, GLUCOSEU, HGBUR, BILIRUBINUR, KETONESUR, PROTEINUR, UROBILINOGEN, NITRITE, LEUKOCYTESUR in the last 72 hours.  Invalid input(s): APPERANCEUR     Imaging: No results found.   Medications:    sodium chloride     sodium chloride     ceFEPime (MAXIPIME) IV 1 g (01/11/22 0930)     allopurinol  200 mg Oral Daily   aspirin EC  81 mg Oral Daily   atorvastatin  40 mg Oral QHS   calcium acetate  2,001 mg Oral TID WC   Chlorhexidine Gluconate Cloth  6 each Topical Q0600   cholecalciferol  2,000 Units Oral Daily    clopidogrel  75 mg Oral Q breakfast   docusate sodium  100 mg Oral Daily   gabapentin  300 mg Oral QHS   heparin  5,000 Units Subcutaneous Q8H   insulin aspart  0-5 Units Subcutaneous QHS   insulin aspart  0-6 Units Subcutaneous TID WC   isosorbide mononitrate  30 mg Oral Daily   metoprolol succinate  25 mg Oral Daily   multivitamin  1 tablet Oral Daily   ondansetron (ZOFRAN) IV  4 mg Intravenous Once   pantoprazole  40 mg Oral Daily   polyethylene glycol  17 g Oral Daily   tamsulosin  0.4 mg Oral QPC supper   triamcinolone cream   Topical BID   umeclidinium-vilanterol  1 puff Inhalation Daily   sodium chloride, sodium chloride, albuterol, alteplase, cyclobenzaprine, diphenhydrAMINE, heparin, hydrALAZINE, HYDROmorphone (DILAUDID) injection, lidocaine (PF), lidocaine-prilocaine, ondansetron (ZOFRAN) IV, oxyCODONE, pentafluoroprop-tetrafluoroeth  Assessment/ Plan:  [name] is a 56 y.o.  male ith a past medical history including GERD, anemia, chronic combined systolic and diastolic heart failure, diabetes, CAD, hypertension, sleep apnea, and end-stage renal disease on hemodialysis.  Patient presents to the emergency department with complaints of left-sided abdominal pain persisting for 2 to 3 days.  Patient has been admitted for Pyelonephritis [N12]  [facility] MWF [place]. Right AVF 120kg  End-stage renal disease on dialysis.  Will maintain outpatient schedule during this admission.  Receiving dialysis today, UF goal 1.5L as tolerated. Next treatment scheduled for Monday  2. Anemia of chronic kidney disease Lab Results  Component Value Date   HGB 10.6 (L) 01/12/2022    Will monitor  3. Secondary Hyperparathyroidism: Lab Results  Component Value Date   PTH 369 (H) 06/16/2019   CALCIUM 8.7 (L) 01/12/2022   CAION 1.11 (L) 02/24/2020   PHOS 7.5 (H) 01/10/2022    Will continue to monitor bone minerals during this admission. Continue daily cholecalciferol and  calcium acetate with meals.  4. Diabetes mellitus type II with chronic kidney disease: insulin dependent. Home regimen includes glipizide [place] and Trulicity. Most recent hemoglobin A1c is 7.1 on 05/09/21.     Primary team to manage sliding scale insulin.  5.  Acute pyelonephritis.  Perinephric stranding seen on CT renal stone and positive UA.  Urine culture with gram-negative rods.  Treating with cefepime   LOS: 4   1/27/20231:17 PM

## 2022-01-12 NOTE — Progress Notes (Signed)
PROGRESS NOTE    [name]  YDX:[id number] DOB: May 17, 1966 DOA: 01/08/2022 PCP: [facility]   Assessment & Plan:   Principal Problem:   Acute left flank pain Active Problems:   CAD (coronary artery disease)   ESRD (end stage renal disease) on dialysis (HCC)   Diabetes mellitus without complication (HCC)   CHF (congestive heart failure) (HCC)   COPD (chronic obstructive pulmonary disease) (HCC)   HLD (hyperlipidemia)   Type 2 diabetes mellitus with hyperlipidemia ([place])   Acute pyelonephritis   Pyelonephritis   Acute pyelonephritis: No stone seen on CT scan. Continue on IV cefepime. Urine cx is growing on enterobacter cloacae   ESRD: on HD. Management as per nephro   Chronic hypoxic respiratory failure: continue on supplemental oxygen. Encourage incentive spirometry    Hyperkalemia: labile. Will be managed w/ HD   Constipation: continue on miralax and started on lactulose   COPD: w/o exacerbation. Continue on bronchodilators   Hx of CAD: continue on imdur, plavix, & aspirin   Chronic systolic CHF: continue on metoprolol, imdur & statin. Volume management w/ HD. Monitor I/Os. Had a cardiac MRI ordered as outpatient.  DM2: HbA1c 8.3, poorly controlled. Continue on SSI w/ accuchecks   HLD: continue on statin   BPH: continue on flomax   Hx of gout: continue on home dose of allopurinol   Diabetic neuropathy: continue on home dose of gabapentin   Psoriasis: continue on triamcinolone cream  Morbid obesity: BMI 45.8. Complicates overall care & prognosis   DVT prophylaxis: heparin  Code Status: full  Family Communication:  Disposition Plan: likely d/c back home   Level of care: Telemetry Medical  Status is: Inpatient  Remains inpatient appropriate because: severity of illness    Consultants:  Nephro   Procedures:   Antimicrobials: cefepime    Subjective: Pt c/o constipation   Objective: Vitals:   01/11/22 1409 01/11/22 1533  01/11/22 1916 01/12/22 0426  BP:  127/82 (!) 140/92 135/88  Pulse: (!) 101 99 96 91  Resp:  20 20 20   Temp:  99.5 F (37.5 C) 99 F (37.2 C) 98 F (36.7 C)  TempSrc:  Oral Oral Oral  SpO2: 92% 95% 93% 94%  Weight:      Height:        Intake/Output Summary (Last 24 hours) at 01/12/2022 0806 Last data filed at 01/12/2022 0501 Gross per 24 hour  Intake 460 ml  Output 250 ml  Net 210 ml   Filed Weights   01/10/22 1529 01/10/22 1857 01/11/22 0500  Weight: 116.8 kg 116 kg 120 kg    Examination:  General exam: Appears calm & comfortable  Respiratory system: diminished breath sounds b/l   Cardiovascular system: S1/S2+. No rubs or gallops   Gastrointestinal system: Abd is soft, NT, obese & hypoactive bowel sounds  Central nervous system: Alert and oriented. Moves all extremities  Psychiatry: judgement and insight appears normal. Flat mood and affect     Data Reviewed: I have personally reviewed following labs and imaging studies  CBC: Recent Labs  Lab 01/08/22 1246 01/10/22 0842 01/11/22 0534 01/12/22 0532  WBC 10.7* 11.7* 9.3 8.4  HGB 12.2* 11.9* 11.2* 10.6*  HCT 38.1* 36.4* 37.0* 34.9*  MCV 89.2 91.7 93.7 93.1  PLT 201 208 185 720   Basic Metabolic Panel: Recent Labs  Lab 01/08/22 1246 01/10/22 0842 01/11/22 0534 01/12/22 0532  NA 135 132* 132* 131*  K 3.9 7.0* 5.4* 5.7*  CL 96* 91* 92* 90*  CO2 28 28 28 28   GLUCOSE 184* 159* 133* 133*  BUN 29* 64* 38* 54*  CREATININE 7.20* 13.39* 9.95* 12.44*  CALCIUM 8.3* 8.7* 9.0 8.7*  PHOS  --  7.5*  --   --    GFR: Estimated Creatinine Clearance: 8 mL/min (A) (by C-G formula based on SCr of 12.44 mg/dL (H)). Liver Function Tests: Recent Labs  Lab 01/10/22 0842  ALBUMIN 3.5   No results for input(s): LIPASE, AMYLASE in the last 168 hours. No results for input(s): AMMONIA in the last 168 hours. Coagulation Profile: No results for input(s): INR, PROTIME in the last 168 hours. Cardiac Enzymes: No results for  input(s): CKTOTAL, CKMB, CKMBINDEX, TROPONINI in the last 168 hours. BNP (last 3 results) No results for input(s): PROBNP in the last 8760 hours. HbA1C: Recent Labs    01/09/22 0915  HGBA1C 8.3*   CBG: Recent Labs  Lab 01/10/22 2104 01/11/22 0817 01/11/22 1137 01/11/22 1654 01/11/22 2146  GLUCAP 132* 120* 171* 152* 137*   Lipid Profile: No results for input(s): CHOL, HDL, LDLCALC, TRIG, CHOLHDL, LDLDIRECT in the last 72 hours. Thyroid Function Tests: No results for input(s): TSH, T4TOTAL, FREET4, T3FREE, THYROIDAB in the last 72 hours.  Anemia Panel: No results for input(s): VITAMINB12, FOLATE, FERRITIN, TIBC, IRON, RETICCTPCT in the last 72 hours. Sepsis Labs: No results for input(s): PROCALCITON, LATICACIDVEN in the last 168 hours.  Recent Results (from the past 240 hour(s))  Urine Culture     Status: Abnormal   Collection Time: 01/08/22  5:25 PM   Specimen: Urine, Clean Catch  Result Value Ref Range Status   Specimen Description   Final    URINE, CLEAN CATCH Performed at [facility], [address] [place], [place] [address]    Special Requests   Final    NONE Performed at [facility], [place], [place] [address]    Culture >=100,000 COLONIES/mL ENTEROBACTER CLOACAE (A)  Final   Report Status 01/11/2022 FINAL  Final   Organism ID, Bacteria ENTEROBACTER CLOACAE (A)  Final      Susceptibility   Enterobacter cloacae - MIC*    CEFAZOLIN RESISTANT Resistant     CEFEPIME <=0.12 SENSITIVE Sensitive     CIPROFLOXACIN <=0.25 SENSITIVE Sensitive     GENTAMICIN <=1 SENSITIVE Sensitive     IMIPENEM <=0.25 SENSITIVE Sensitive     NITROFURANTOIN 32 SENSITIVE Sensitive     TRIMETH/SULFA <=20 SENSITIVE Sensitive     PIP/TAZO <=4 SENSITIVE Sensitive     * >=100,000 COLONIES/mL ENTEROBACTER CLOACAE  Resp Panel by RT-PCR (Flu A&B, Covid) Nasopharyngeal Swab     Status: None   Collection Time: 01/08/22  7:55 PM   Specimen: Nasopharyngeal  Swab; Nasopharyngeal(NP) swabs in vial transport medium  Result Value Ref Range Status   SARS Coronavirus 2 by RT PCR NEGATIVE NEGATIVE Final    Comment: (NOTE) SARS-CoV-2 target nucleic acids are NOT DETECTED.  The SARS-CoV-2 RNA is generally detectable in upper respiratory specimens during the acute phase of infection. The lowest concentration of SARS-CoV-2 viral copies this assay can detect is 138 copies/mL. A negative result does not preclude SARS-Cov-2 infection and should not be used as the sole basis for treatment or other patient management decisions. A negative result may occur with  improper specimen collection/handling, submission of specimen other than nasopharyngeal swab, presence of viral mutation(s) within the areas targeted by this assay, and inadequate number of viral copies(<138 copies/mL). A negative result must be combined with  clinical observations, patient history, and epidemiological information. The expected result is Negative.  Fact Sheet for Patients:  EntrepreneurPulse.com.au  Fact Sheet for Healthcare Providers:  IncredibleEmployment.be  This test is no t yet approved or cleared by the [place] FDA and  has been authorized for detection and/or diagnosis of SARS-CoV-2 by FDA under an Emergency Use Authorization (EUA). This EUA will remain  in effect (meaning this test can be used) for the duration of the COVID-19 declaration under Section 564(b)(1) of the Act, 21 U.S.C.section 360bbb-3(b)(1), unless the authorization is terminated  or revoked sooner.       Influenza A by PCR NEGATIVE NEGATIVE Final   Influenza B by PCR NEGATIVE NEGATIVE Final    Comment: (NOTE) The Xpert Xpress SARS-CoV-2/FLU/RSV plus assay is intended as an aid in the diagnosis of influenza from Nasopharyngeal swab specimens and should not be used as a sole basis for treatment. Nasal washings and aspirates are unacceptable for Xpert Xpress  SARS-CoV-2/FLU/RSV testing.  Fact Sheet for Patients: EntrepreneurPulse.com.au  Fact Sheet for Healthcare Providers: IncredibleEmployment.be  This test is not yet approved or cleared by the [place] FDA and has been authorized for detection and/or diagnosis of SARS-CoV-2 by FDA under an Emergency Use Authorization (EUA). This EUA will remain in effect (meaning this test can be used) for the duration of the COVID-19 declaration under Section 564(b)(1) of the Act, 21 U.S.C. section 360bbb-3(b)(1), unless the authorization is terminated or revoked.  Performed at [facility], [address] [place], [place] [address]          Radiology Studies: No results found.      Scheduled Meds:  allopurinol  200 mg Oral Daily   aspirin EC  81 mg Oral Daily   atorvastatin  40 mg Oral QHS   calcium acetate  2,001 mg Oral TID WC   Chlorhexidine Gluconate Cloth  6 each Topical Q0600   cholecalciferol  2,000 Units Oral Daily   clopidogrel  75 mg Oral Q breakfast   docusate sodium  100 mg Oral Daily   gabapentin  300 mg Oral QHS   heparin  5,000 Units Subcutaneous Q8H   insulin aspart  0-5 Units Subcutaneous QHS   insulin aspart  0-6 Units Subcutaneous TID WC   isosorbide mononitrate  30 mg Oral Daily   metoprolol succinate  25 mg Oral Daily   multivitamin  1 tablet Oral Daily   ondansetron (ZOFRAN) IV  4 mg Intravenous Once   pantoprazole  40 mg Oral Daily   polyethylene glycol  17 g Oral Daily   tamsulosin  0.4 mg Oral QPC supper   triamcinolone cream   Topical BID   umeclidinium-vilanterol  1 puff Inhalation Daily   Continuous Infusions:  sodium chloride     sodium chloride     ceFEPime (MAXIPIME) IV 1 g (01/11/22 0930)     LOS: 4 days    Time spent: 25 mins     Wyvonnia Dusky, MD [facility] Pager [phone]  If 7PM-7AM, please contact night-coverage 01/12/2022, 8:06 AM

## 2022-01-13 LAB — CBC
HCT: 34.1 % — ABNORMAL LOW (ref 39.0–52.0)
Hemoglobin: 10.5 g/dL — ABNORMAL LOW (ref 13.0–17.0)
MCH: 28.4 pg (ref 26.0–34.0)
MCHC: 30.8 g/dL (ref 30.0–36.0)
MCV: 92.2 fL (ref 80.0–100.0)
Platelets: 205 10*3/uL (ref 150–400)
RBC: 3.7 MIL/uL — ABNORMAL LOW (ref 4.22–5.81)
RDW: 13.4 % (ref 11.5–15.5)
WBC: 8.2 10*3/uL (ref 4.0–10.5)
nRBC: 0 % (ref 0.0–0.2)

## 2022-01-13 LAB — GLUCOSE, CAPILLARY
Glucose-Capillary: 133 mg/dL — ABNORMAL HIGH (ref 70–99)
Glucose-Capillary: 137 mg/dL — ABNORMAL HIGH (ref 70–99)
Glucose-Capillary: 165 mg/dL — ABNORMAL HIGH (ref 70–99)
Glucose-Capillary: 138 mg/dL — ABNORMAL HIGH (ref 70–99)

## 2022-01-13 LAB — BASIC METABOLIC PANEL
Anion gap: 12 (ref 5–15)
BUN: 38 mg/dL — ABNORMAL HIGH (ref 6–20)
CO2: 30 mmol/L (ref 22–32)
Calcium: 9.2 mg/dL (ref 8.9–10.3)
Chloride: 94 mmol/L — ABNORMAL LOW (ref 98–111)
Creatinine, Ser: 9.42 mg/dL — ABNORMAL HIGH (ref 0.61–1.24)
GFR, Estimated: 6 mL/min — ABNORMAL LOW (ref >60)
Glucose, Bld: 154 mg/dL — ABNORMAL HIGH (ref 70–99)
Potassium: 5 mmol/L (ref 3.5–5.1)
Sodium: 136 mmol/L (ref 135–145)

## 2022-01-13 NOTE — Progress Notes (Signed)
[facility]  ROUNDING NOTE   Subjective:   [name] is a 56 year old African-American male with a past medical history including GERD, anemia, chronic combined systolic and diastolic heart failure, diabetes, CAD, hypertension, sleep apnea, and end-stage renal disease on hemodialysis.  Patient presents to the emergency department with complaints of left-sided abdominal pain persisting for 2 to 3 days.  Patient has been admitted for Pyelonephritis [N12]  Patient is known to our clinic and receives outpatient dialysis treatments at [facility] on a MWF schedule, supervised by Dr. Holley Raring.    Patient was seen today on second floor Patient offers no new specific physical complaint. Patient from me that his abdominal pain is now better   Objective:  Vital signs in last 24 hours:  Temp:  [97.6 F (36.4 C)-98.9 F (37.2 C)] 98.9 F (37.2 C) (01/28 0807) Pulse Rate:  [88-100] 91 (01/28 0807) Resp:  [11-20] 18 (01/28 0807) BP: (105-145)/(68-100) 123/70 (01/28 0807) SpO2:  [93 %-99 %] 99 % (01/28 0807) Weight:  [119.4 kg-119.8 kg] 119.8 kg (01/27 1302)  Weight change:  Filed Weights   01/11/22 0500 01/12/22 0948 01/12/22 1302  Weight: 120 kg 119.4 kg 119.8 kg    Intake/Output: I/O last 3 completed shifts: In: -  Out: 2101 [Urine:100; Other:2001]   Intake/Output this shift:  No intake/output data recorded.  Physical Exam: General: NAD, lying in bed  Head: Normocephalic, atraumatic. Moist oral mucosal membranes  Eyes: Anicteric  Lungs:  Clear to auscultation, normal effort, 3 L [place]  Heart: Regular rate and rhythm  Abdomen:  Soft, left-sided tenderness, obese  Extremities: Trace peripheral edema.  Neurologic: Nonfocal, moving all four extremities  Skin: Dry, scaly  Access: RUE AVF    Basic Metabolic Panel: Recent Labs  Lab 01/08/22 1246 01/10/22 0842 01/11/22 0534 01/12/22 0532 01/13/22 0349  NA 135 132* 132* 131* 136  K 3.9 7.0* 5.4* 5.7*  5.0  CL 96* 91* 92* 90* 94*  CO2 28 28 28 28 30   GLUCOSE 184* 159* 133* 133* 154*  BUN 29* 64* 38* 54* 38*  CREATININE 7.20* 13.39* 9.95* 12.44* 9.42*  CALCIUM 8.3* 8.7* 9.0 8.7* 9.2  PHOS  --  7.5*  --   --   --     Liver Function Tests: Recent Labs  Lab 01/10/22 0842  ALBUMIN 3.5   No results for input(s): LIPASE, AMYLASE in the last 168 hours. No results for input(s): AMMONIA in the last 168 hours.  CBC: Recent Labs  Lab 01/08/22 1246 01/10/22 0842 01/11/22 0534 01/12/22 0532 01/13/22 0349  WBC 10.7* 11.7* 9.3 8.4 8.2  HGB 12.2* 11.9* 11.2* 10.6* 10.5*  HCT 38.1* 36.4* 37.0* 34.9* 34.1*  MCV 89.2 91.7 93.7 93.1 92.2  PLT 201 208 185 189 205    Cardiac Enzymes: No results for input(s): CKTOTAL, CKMB, CKMBINDEX, TROPONINI in the last 168 hours.  BNP: Invalid input(s): POCBNP  CBG: Recent Labs  Lab 01/11/22 2146 01/12/22 0835 01/12/22 1730 01/12/22 2020 01/13/22 0806  GLUCAP 137* 128* 174* 169* 133*    Microbiology: Results for orders placed or performed during the hospital encounter of 01/08/22  Urine Culture     Status: Abnormal   Collection Time: 01/08/22  5:25 PM   Specimen: Urine, Clean Catch  Result Value Ref Range Status   Specimen Description   Final    URINE, CLEAN CATCH Performed at [facility], [address] [place], [place] [address]    Special Requests   Final  NONE Performed at [facility], [address] [place], [place] [address]    Culture >=100,000 COLONIES/mL ENTEROBACTER CLOACAE (A)  Final   Report Status 01/11/2022 FINAL  Final   Organism ID, Bacteria ENTEROBACTER CLOACAE (A)  Final      Susceptibility   Enterobacter cloacae - MIC*    CEFAZOLIN RESISTANT Resistant     CEFEPIME <=0.12 SENSITIVE Sensitive     CIPROFLOXACIN <=0.25 SENSITIVE Sensitive     GENTAMICIN <=1 SENSITIVE Sensitive     IMIPENEM <=0.25 SENSITIVE Sensitive     NITROFURANTOIN 32 SENSITIVE Sensitive     TRIMETH/SULFA <=20  SENSITIVE Sensitive     PIP/TAZO <=4 SENSITIVE Sensitive     * >=100,000 COLONIES/mL ENTEROBACTER CLOACAE  Resp Panel by RT-PCR (Flu A&B, Covid) Nasopharyngeal Swab     Status: None   Collection Time: 01/08/22  7:55 PM   Specimen: Nasopharyngeal Swab; Nasopharyngeal(NP) swabs in vial transport medium  Result Value Ref Range Status   SARS Coronavirus 2 by RT PCR NEGATIVE NEGATIVE Final    Comment: (NOTE) SARS-CoV-2 target nucleic acids are NOT DETECTED.  The SARS-CoV-2 RNA is generally detectable in upper respiratory specimens during the acute phase of infection. The lowest concentration of SARS-CoV-2 viral copies this assay can detect is 138 copies/mL. A negative result does not preclude SARS-Cov-2 infection and should not be used as the sole basis for treatment or other patient management decisions. A negative result may occur with  improper specimen collection/handling, submission of specimen other than nasopharyngeal swab, presence of viral mutation(s) within the areas targeted by this assay, and inadequate number of viral copies(<138 copies/mL). A negative result must be combined with clinical observations, patient history, and epidemiological information. The expected result is Negative.  Fact Sheet for Patients:  EntrepreneurPulse.com.au  Fact Sheet for Healthcare Providers:  IncredibleEmployment.be  This test is no t yet approved or cleared by the [place] FDA and  has been authorized for detection and/or diagnosis of SARS-CoV-2 by FDA under an Emergency Use Authorization (EUA). This EUA will remain  in effect (meaning this test can be used) for the duration of the COVID-19 declaration under Section 564(b)(1) of the Act, 21 U.S.C.section 360bbb-3(b)(1), unless the authorization is terminated  or revoked sooner.       Influenza A by PCR NEGATIVE NEGATIVE Final   Influenza B by PCR NEGATIVE NEGATIVE Final    Comment: (NOTE) The  Xpert Xpress SARS-CoV-2/FLU/RSV plus assay is intended as an aid in the diagnosis of influenza from Nasopharyngeal swab specimens and should not be used as a sole basis for treatment. Nasal washings and aspirates are unacceptable for Xpert Xpress SARS-CoV-2/FLU/RSV testing.  Fact Sheet for Patients: EntrepreneurPulse.com.au  Fact Sheet for Healthcare Providers: IncredibleEmployment.be  This test is not yet approved or cleared by the [place] FDA and has been authorized for detection and/or diagnosis of SARS-CoV-2 by FDA under an Emergency Use Authorization (EUA). This EUA will remain in effect (meaning this test can be used) for the duration of the COVID-19 declaration under Section 564(b)(1) of the Act, 21 U.S.C. section 360bbb-3(b)(1), unless the authorization is terminated or revoked.  Performed at [facility], [address] [place], [place] [address]     Coagulation Studies: No results for input(s): LABPROT, INR in the last 72 hours.  Urinalysis: No results for input(s): COLORURINE, LABSPEC, PHURINE, GLUCOSEU, HGBUR, BILIRUBINUR, KETONESUR, PROTEINUR, UROBILINOGEN, NITRITE, LEUKOCYTESUR in the last 72 hours.  Invalid input(s): APPERANCEUR     Imaging: No results  found.   Medications:    ceFEPime (MAXIPIME) IV Stopped (01/12/22 1820)     allopurinol  200 mg Oral Daily   aspirin EC  81 mg Oral Daily   atorvastatin  40 mg Oral QHS   calcium acetate  2,001 mg Oral TID WC   Chlorhexidine Gluconate Cloth  6 each Topical Q0600   cholecalciferol  2,000 Units Oral Daily   clopidogrel  75 mg Oral Q breakfast   docusate sodium  100 mg Oral Daily   gabapentin  300 mg Oral QHS   heparin  5,000 Units Subcutaneous Q8H   insulin aspart  0-5 Units Subcutaneous QHS   insulin aspart  0-6 Units Subcutaneous TID WC   isosorbide mononitrate  30 mg Oral Daily   metoprolol succinate  25 mg Oral Daily   multivitamin  1 tablet  Oral Daily   ondansetron (ZOFRAN) IV  4 mg Intravenous Once   pantoprazole  40 mg Oral Daily   polyethylene glycol  17 g Oral Daily   tamsulosin  0.4 mg Oral QPC supper   triamcinolone cream   Topical BID   umeclidinium-vilanterol  1 puff Inhalation Daily   albuterol, cyclobenzaprine, diphenhydrAMINE, hydrALAZINE, HYDROmorphone (DILAUDID) injection, ondansetron (ZOFRAN) IV, oxyCODONE  Assessment/ Plan:  Mr. ANGEL WEEDON is a 56 y.o.  male ith a past medical history including GERD, anemia, chronic combined systolic and diastolic heart failure, diabetes, CAD, hypertension, sleep apnea, and end-stage renal disease on hemodialysis.  Patient presents to the emergency department with complaints of left-sided abdominal pain persisting for 2 to 3 days.  Patient has been admitted for Pyelonephritis [N12]  [facility] MWF [place]. Right AVF 120kg  End-stage renal disease on dialysis.   We will  maintain outpatient schedule during this admission.   Received last dialysis treatment yesterday. No need for renal replacement therapy today. Patient will receive next treatment scheduled for Monday  2. Anemia of chronic kidney disease Lab Results  Component Value Date   HGB 10.5 (L) 01/13/2022    Will monitor  3. Secondary Hyperparathyroidism: Lab Results  Component Value Date   PTH 369 (H) 06/16/2019   CALCIUM 9.2 01/13/2022   CAION 1.11 (L) 02/24/2020   PHOS 7.5 (H) 01/10/2022    Will continue to monitor bone minerals during this admission. Continue daily cholecalciferol and calcium acetate with meals.  4. Diabetes mellitus type II with chronic kidney disease: insulin dependent. Home regimen includes glipizide [place] and Trulicity. Most recent hemoglobin A1c is 7.1 on 05/09/21.     Primary team to manage sliding scale insulin.  5.  Acute pyelonephritis.  Perinephric stranding seen on CT renal stone and positive UA.  Urine culture with gram-negative rods.  Patient was treated with  cefepime   LOS: 5 Shaquella Stamant s Loran Fleet 1/28/20238:58 AM

## 2022-01-13 NOTE — Progress Notes (Signed)
PROGRESS NOTE    [name]  FOY:[id number] DOB: 1966/04/10 DOA: 01/08/2022 PCP: [facility]   Assessment & Plan:   Principal Problem:   Acute left flank pain Active Problems:   CAD (coronary artery disease)   ESRD (end stage renal disease) on dialysis (HCC)   Diabetes mellitus without complication (HCC)   CHF (congestive heart failure) (HCC)   COPD (chronic obstructive pulmonary disease) (HCC)   HLD (hyperlipidemia)   Type 2 diabetes mellitus with hyperlipidemia ([place])   Acute pyelonephritis   Pyelonephritis   Acute pyelonephritis: No stone seen on CT scan. Continue on IV cefepime. Urine cx is growing on enterobacter cloacae   ESRD: on HD. Management as per nephro    Chronic hypoxic respiratory failure: continue on supplemental oxygen.   Hyperkalemia:WNL today. Managed w/ HD   Constipation: resolved   COPD: w/o exacerbation. Continue on bronchodilators   Hx of CAD: continue on plavix, imdur, & aspirin   Chronic systolic CHF: continue on metoprolol, imdur & statin. Volume management w/ HD. Monitor I/Os. Had a cardiac MRI ordered as outpatient.  DM2: poorly controlled, HbA1c 8.3. Continue on SSI w/ accuchecks   HLD: continue on statin    BPH: continue on flomax   Hx of gout: continue on home dose of allopurinol   Diabetic neuropathy: continue on home dose of gabapentin   Psoriasis: continue on triamcinolone cream  Morbid obesity: BMI 45.8. Complicates overall care & prognosis   DVT prophylaxis: heparin  Code Status: full  Family Communication:  Disposition Plan: likely d/c back home   Level of care: Telemetry Medical  Status is: Inpatient  Remains inpatient appropriate because: severity of illness    Consultants:  Nephro   Procedures:   Antimicrobials: cefepime    Subjective: Pt c/o malaise  Objective: Vitals:   01/12/22 1302 01/12/22 1330 01/12/22 2019 01/13/22 0514  BP: 112/69  (!) 145/73 130/78  Pulse:  100 99 88   Resp:  14 20 20   Temp:   97.7 F (36.5 C) 97.6 F (36.4 C)  TempSrc:   Oral Oral  SpO2:  97% 93% 97%  Weight: 119.8 kg     Height:        Intake/Output Summary (Last 24 hours) at 01/13/2022 0759 Last data filed at 01/12/2022 1302 Gross per 24 hour  Intake --  Output 2001 ml  Net -2001 ml   Filed Weights   01/11/22 0500 01/12/22 0948 01/12/22 1302  Weight: 120 kg 119.4 kg 119.8 kg    Examination:  General exam: Appears comfortable  Respiratory system: decreased breath sounds b/l. No wheezes Cardiovascular system: S1/S2+. No gallops or rubs  Gastrointestinal system: Abd is soft, NT, obese & normal bowel sounds  Central nervous system: alert and oriented. Moves all extremities  Psychiatry: judgement and insight appears normal. Flat mood and affect     Data Reviewed: I have personally reviewed following labs and imaging studies  CBC: Recent Labs  Lab 01/08/22 1246 01/10/22 0842 01/11/22 0534 01/12/22 0532 01/13/22 0349  WBC 10.7* 11.7* 9.3 8.4 8.2  HGB 12.2* 11.9* 11.2* 10.6* 10.5*  HCT 38.1* 36.4* 37.0* 34.9* 34.1*  MCV 89.2 91.7 93.7 93.1 92.2  PLT 201 208 185 189 767   Basic Metabolic Panel: Recent Labs  Lab 01/08/22 1246 01/10/22 0842 01/11/22 0534 01/12/22 0532 01/13/22 0349  NA 135 132* 132* 131* 136  K 3.9 7.0* 5.4* 5.7* 5.0  CL 96* 91* 92* 90* 94*  CO2 28 28 28  28  30  GLUCOSE 184* 159* 133* 133* 154*  BUN 29* 64* 38* 54* 38*  CREATININE 7.20* 13.39* 9.95* 12.44* 9.42*  CALCIUM 8.3* 8.7* 9.0 8.7* 9.2  PHOS  --  7.5*  --   --   --    GFR: Estimated Creatinine Clearance: 10.5 mL/min (A) (by C-G formula based on SCr of 9.42 mg/dL (H)). Liver Function Tests: Recent Labs  Lab 01/10/22 0842  ALBUMIN 3.5   No results for input(s): LIPASE, AMYLASE in the last 168 hours. No results for input(s): AMMONIA in the last 168 hours. Coagulation Profile: No results for input(s): INR, PROTIME in the last 168 hours. Cardiac Enzymes: No results for  input(s): CKTOTAL, CKMB, CKMBINDEX, TROPONINI in the last 168 hours. BNP (last 3 results) No results for input(s): PROBNP in the last 8760 hours. HbA1C: No results for input(s): HGBA1C in the last 72 hours.  CBG: Recent Labs  Lab 01/11/22 1654 01/11/22 2146 01/12/22 0835 01/12/22 1730 01/12/22 2020  GLUCAP 152* 137* 128* 174* 169*   Lipid Profile: No results for input(s): CHOL, HDL, LDLCALC, TRIG, CHOLHDL, LDLDIRECT in the last 72 hours. Thyroid Function Tests: No results for input(s): TSH, T4TOTAL, FREET4, T3FREE, THYROIDAB in the last 72 hours.  Anemia Panel: No results for input(s): VITAMINB12, FOLATE, FERRITIN, TIBC, IRON, RETICCTPCT in the last 72 hours. Sepsis Labs: No results for input(s): PROCALCITON, LATICACIDVEN in the last 168 hours.  Recent Results (from the past 240 hour(s))  Urine Culture     Status: Abnormal   Collection Time: 01/08/22  5:25 PM   Specimen: Urine, Clean Catch  Result Value Ref Range Status   Specimen Description   Final    URINE, CLEAN CATCH Performed at [facility], [address] [place], [place] [address]    Special Requests   Final    NONE Performed at [facility], [place], [place] [address]    Culture >=100,000 COLONIES/mL ENTEROBACTER CLOACAE (A)  Final   Report Status 01/11/2022 FINAL  Final   Organism ID, Bacteria ENTEROBACTER CLOACAE (A)  Final      Susceptibility   Enterobacter cloacae - MIC*    CEFAZOLIN RESISTANT Resistant     CEFEPIME <=0.12 SENSITIVE Sensitive     CIPROFLOXACIN <=0.25 SENSITIVE Sensitive     GENTAMICIN <=1 SENSITIVE Sensitive     IMIPENEM <=0.25 SENSITIVE Sensitive     NITROFURANTOIN 32 SENSITIVE Sensitive     TRIMETH/SULFA <=20 SENSITIVE Sensitive     PIP/TAZO <=4 SENSITIVE Sensitive     * >=100,000 COLONIES/mL ENTEROBACTER CLOACAE  Resp Panel by RT-PCR (Flu A&B, Covid) Nasopharyngeal Swab     Status: None   Collection Time: 01/08/22  7:55 PM   Specimen:  Nasopharyngeal Swab; Nasopharyngeal(NP) swabs in vial transport medium  Result Value Ref Range Status   SARS Coronavirus 2 by RT PCR NEGATIVE NEGATIVE Final    Comment: (NOTE) SARS-CoV-2 target nucleic acids are NOT DETECTED.  The SARS-CoV-2 RNA is generally detectable in upper respiratory specimens during the acute phase of infection. The lowest concentration of SARS-CoV-2 viral copies this assay can detect is 138 copies/mL. A negative result does not preclude SARS-Cov-2 infection and should not be used as the sole basis for treatment or other patient management decisions. A negative result may occur with  improper specimen collection/handling, submission of specimen other than nasopharyngeal swab, presence of viral mutation(s) within the areas targeted by this assay, and inadequate number of viral copies(<138 copies/mL). A negative result must be  combined with clinical observations, patient history, and epidemiological information. The expected result is Negative.  Fact Sheet for Patients:  EntrepreneurPulse.com.au  Fact Sheet for Healthcare Providers:  IncredibleEmployment.be  This test is no t yet approved or cleared by the [place] FDA and  has been authorized for detection and/or diagnosis of SARS-CoV-2 by FDA under an Emergency Use Authorization (EUA). This EUA will remain  in effect (meaning this test can be used) for the duration of the COVID-19 declaration under Section 564(b)(1) of the Act, 21 U.S.C.section 360bbb-3(b)(1), unless the authorization is terminated  or revoked sooner.       Influenza A by PCR NEGATIVE NEGATIVE Final   Influenza B by PCR NEGATIVE NEGATIVE Final    Comment: (NOTE) The Xpert Xpress SARS-CoV-2/FLU/RSV plus assay is intended as an aid in the diagnosis of influenza from Nasopharyngeal swab specimens and should not be used as a sole basis for treatment. Nasal washings and aspirates are unacceptable for  Xpert Xpress SARS-CoV-2/FLU/RSV testing.  Fact Sheet for Patients: EntrepreneurPulse.com.au  Fact Sheet for Healthcare Providers: IncredibleEmployment.be  This test is not yet approved or cleared by the [place] FDA and has been authorized for detection and/or diagnosis of SARS-CoV-2 by FDA under an Emergency Use Authorization (EUA). This EUA will remain in effect (meaning this test can be used) for the duration of the COVID-19 declaration under Section 564(b)(1) of the Act, 21 U.S.C. section 360bbb-3(b)(1), unless the authorization is terminated or revoked.  Performed at [facility], [address], [place], [place] [address]          Radiology Studies: No results found.      Scheduled Meds:  allopurinol  200 mg Oral Daily   aspirin EC  81 mg Oral Daily   atorvastatin  40 mg Oral QHS   calcium acetate  2,001 mg Oral TID WC   Chlorhexidine Gluconate Cloth  6 each Topical Q0600   cholecalciferol  2,000 Units Oral Daily   clopidogrel  75 mg Oral Q breakfast   docusate sodium  100 mg Oral Daily   gabapentin  300 mg Oral QHS   heparin  5,000 Units Subcutaneous Q8H   insulin aspart  0-5 Units Subcutaneous QHS   insulin aspart  0-6 Units Subcutaneous TID WC   isosorbide mononitrate  30 mg Oral Daily   metoprolol succinate  25 mg Oral Daily   multivitamin  1 tablet Oral Daily   ondansetron (ZOFRAN) IV  4 mg Intravenous Once   pantoprazole  40 mg Oral Daily   polyethylene glycol  17 g Oral Daily   tamsulosin  0.4 mg Oral QPC supper   triamcinolone cream   Topical BID   umeclidinium-vilanterol  1 puff Inhalation Daily   Continuous Infusions:  ceFEPime (MAXIPIME) IV Stopped (01/12/22 1820)     LOS: 5 days    Time spent: 15 mins     Wyvonnia Dusky, MD [facility] Pager [phone]  If 7PM-7AM, please contact night-coverage 01/13/2022, 7:59 AM

## 2022-01-14 LAB — BASIC METABOLIC PANEL
Anion gap: 14 (ref 5–15)
BUN: 52 mg/dL — ABNORMAL HIGH (ref 6–20)
CO2: 27 mmol/L (ref 22–32)
Calcium: 8.9 mg/dL (ref 8.9–10.3)
Chloride: 93 mmol/L — ABNORMAL LOW (ref 98–111)
Creatinine, Ser: 11.31 mg/dL — ABNORMAL HIGH (ref 0.61–1.24)
GFR, Estimated: 5 mL/min — ABNORMAL LOW (ref >60)
Glucose, Bld: 144 mg/dL — ABNORMAL HIGH (ref 70–99)
Potassium: 5.4 mmol/L — ABNORMAL HIGH (ref 3.5–5.1)
Sodium: 134 mmol/L — ABNORMAL LOW (ref 135–145)

## 2022-01-14 LAB — GLUCOSE, CAPILLARY
Glucose-Capillary: 132 mg/dL — ABNORMAL HIGH (ref 70–99)
Glucose-Capillary: 141 mg/dL — ABNORMAL HIGH (ref 70–99)

## 2022-01-14 LAB — CBC
HCT: 32.4 % — ABNORMAL LOW (ref 39.0–52.0)
Hemoglobin: 10.1 g/dL — ABNORMAL LOW (ref 13.0–17.0)
MCH: 28.1 pg (ref 26.0–34.0)
MCHC: 31.2 g/dL (ref 30.0–36.0)
MCV: 90.3 fL (ref 80.0–100.0)
Platelets: 212 10*3/uL (ref 150–400)
RBC: 3.59 MIL/uL — ABNORMAL LOW (ref 4.22–5.81)
RDW: 13.4 % (ref 11.5–15.5)
WBC: 8 10*3/uL (ref 4.0–10.5)
nRBC: 0 % (ref 0.0–0.2)

## 2022-01-14 MED ORDER — CIPROFLOXACIN HCL 500 MG PO TABS: 500.0000 mg | ORAL_TABLET | Freq: Every day | ORAL | 0 refills | Status: AC

## 2022-01-14 MED ORDER — SODIUM ZIRCONIUM CYCLOSILICATE 10 G PO PACK
10.0000 g | PACK | Freq: Once | ORAL | Status: AC
  Administered 2022-01-14: 10 g via ORAL
  Filled 2022-01-14: qty 1

## 2022-01-14 NOTE — Progress Notes (Signed)
[facility]  ROUNDING NOTE   Subjective:   [name] is a 56 year old African-American male with a past medical history including GERD, anemia, chronic combined systolic and diastolic heart failure, diabetes, CAD, hypertension, sleep apnea, and end-stage renal disease on hemodialysis.  Patient presents to the emergency department with complaints of left-sided abdominal pain persisting for 2 to 3 days.  Patient has been admitted for Pyelonephritis [N12]  Patient is known to our clinic and receives outpatient dialysis treatments at [facility] on a MWF schedule, supervised by Dr. Holley Raring.    Patient was seen today on second floor Patient offers no new specific physical complaint. Patient main complaint in today visit was that he was ready to go home. I educated patient on need to restrict potassium intake   Objective:  Vital signs in last 24 hours:  Temp:  [97.8 F (36.6 C)-98.6 F (37 C)] 98 F (36.7 C) (01/29 0745) Pulse Rate:  [82-88] 84 (01/29 0745) Resp:  [16-18] 18 (01/29 0745) BP: (116-139)/(79-88) 127/88 (01/29 0745) SpO2:  [94 %-99 %] 99 % (01/29 0745)  Weight change:  Filed Weights   01/11/22 0500 01/12/22 0948 01/12/22 1302  Weight: 120 kg 119.4 kg 119.8 kg    Intake/Output: I/O last 3 completed shifts: In: 240 [P.O.:240] Out: -    Intake/Output this shift:  No intake/output data recorded.  Physical Exam: General: NAD, lying in bed  Head: Normocephalic, atraumatic. Moist oral mucosal membranes  Eyes: Anicteric  Lungs:  Clear to auscultation, normal effort, 3 L [place]  Heart: Regular rate and rhythm  Abdomen:  Soft, left-sided tenderness, obese  Extremities: Trace peripheral edema.  Neurologic: Nonfocal, moving all four extremities  Skin: Dry, scaly  Access: RUE AVF    Basic Metabolic Panel: Recent Labs  Lab 01/10/22 0842 01/11/22 0534 01/12/22 0532 01/13/22 0349 01/14/22 0326  NA 132* 132* 131* 136 134*  K 7.0* 5.4* 5.7*  5.0 5.4*  CL 91* 92* 90* 94* 93*  CO2 28 28 28 30 27   GLUCOSE 159* 133* 133* 154* 144*  BUN 64* 38* 54* 38* 52*  CREATININE 13.39* 9.95* 12.44* 9.42* 11.31*  CALCIUM 8.7* 9.0 8.7* 9.2 8.9  PHOS 7.5*  --   --   --   --     Liver Function Tests: Recent Labs  Lab 01/10/22 0842  ALBUMIN 3.5   No results for input(s): LIPASE, AMYLASE in the last 168 hours. No results for input(s): AMMONIA in the last 168 hours.  CBC: Recent Labs  Lab 01/10/22 0842 01/11/22 0534 01/12/22 0532 01/13/22 0349 01/14/22 0326  WBC 11.7* 9.3 8.4 8.2 8.0  HGB 11.9* 11.2* 10.6* 10.5* 10.1*  HCT 36.4* 37.0* 34.9* 34.1* 32.4*  MCV 91.7 93.7 93.1 92.2 90.3  PLT 208 185 189 205 212    Cardiac Enzymes: No results for input(s): CKTOTAL, CKMB, CKMBINDEX, TROPONINI in the last 168 hours.  BNP: Invalid input(s): POCBNP  CBG: Recent Labs  Lab 01/13/22 0806 01/13/22 1206 01/13/22 1626 01/13/22 2046 01/14/22 0741  GLUCAP 133* 137* 165* 138* 132*    Microbiology: Results for orders placed or performed during the hospital encounter of 01/08/22  Urine Culture     Status: Abnormal   Collection Time: 01/08/22  5:25 PM   Specimen: Urine, Clean Catch  Result Value Ref Range Status   Specimen Description   Final    URINE, CLEAN CATCH Performed at [facility], [address] [place],  [address]    Special Requests  Final    NONE Performed at [facility], [address] [place], [place] [address]    Culture >=100,000 COLONIES/mL ENTEROBACTER CLOACAE (A)  Final   Report Status 01/11/2022 FINAL  Final   Organism ID, Bacteria ENTEROBACTER CLOACAE (A)  Final      Susceptibility   Enterobacter cloacae - MIC*    CEFAZOLIN RESISTANT Resistant     CEFEPIME <=0.12 SENSITIVE Sensitive     CIPROFLOXACIN <=0.25 SENSITIVE Sensitive     GENTAMICIN <=1 SENSITIVE Sensitive     IMIPENEM <=0.25 SENSITIVE Sensitive     NITROFURANTOIN 32 SENSITIVE Sensitive     TRIMETH/SULFA <=20  SENSITIVE Sensitive     PIP/TAZO <=4 SENSITIVE Sensitive     * >=100,000 COLONIES/mL ENTEROBACTER CLOACAE  Resp Panel by RT-PCR (Flu A&B, Covid) Nasopharyngeal Swab     Status: None   Collection Time: 01/08/22  7:55 PM   Specimen: Nasopharyngeal Swab; Nasopharyngeal(NP) swabs in vial transport medium  Result Value Ref Range Status   SARS Coronavirus 2 by RT PCR NEGATIVE NEGATIVE Final    Comment: (NOTE) SARS-CoV-2 target nucleic acids are NOT DETECTED.  The SARS-CoV-2 RNA is generally detectable in upper respiratory specimens during the acute phase of infection. The lowest concentration of SARS-CoV-2 viral copies this assay can detect is 138 copies/mL. A negative result does not preclude SARS-Cov-2 infection and should not be used as the sole basis for treatment or other patient management decisions. A negative result may occur with  improper specimen collection/handling, submission of specimen other than nasopharyngeal swab, presence of viral mutation(s) within the areas targeted by this assay, and inadequate number of viral copies(<138 copies/mL). A negative result must be combined with clinical observations, patient history, and epidemiological information. The expected result is Negative.  Fact Sheet for Patients:  EntrepreneurPulse.com.au  Fact Sheet for Healthcare Providers:  IncredibleEmployment.be  This test is no t yet approved or cleared by the [place] FDA and  has been authorized for detection and/or diagnosis of SARS-CoV-2 by FDA under an Emergency Use Authorization (EUA). This EUA will remain  in effect (meaning this test can be used) for the duration of the COVID-19 declaration under Section 564(b)(1) of the Act, 21 U.S.C.section 360bbb-3(b)(1), unless the authorization is terminated  or revoked sooner.       Influenza A by PCR NEGATIVE NEGATIVE Final   Influenza B by PCR NEGATIVE NEGATIVE Final    Comment: (NOTE) The  Xpert Xpress SARS-CoV-2/FLU/RSV plus assay is intended as an aid in the diagnosis of influenza from Nasopharyngeal swab specimens and should not be used as a sole basis for treatment. Nasal washings and aspirates are unacceptable for Xpert Xpress SARS-CoV-2/FLU/RSV testing.  Fact Sheet for Patients: EntrepreneurPulse.com.au  Fact Sheet for Healthcare Providers: IncredibleEmployment.be  This test is not yet approved or cleared by the [place] FDA and has been authorized for detection and/or diagnosis of SARS-CoV-2 by FDA under an Emergency Use Authorization (EUA). This EUA will remain in effect (meaning this test can be used) for the duration of the COVID-19 declaration under Section 564(b)(1) of the Act, 21 U.S.C. section 360bbb-3(b)(1), unless the authorization is terminated or revoked.  Performed at [facility], [address] [place], [place] [address]     Coagulation Studies: No results for input(s): LABPROT, INR in the last 72 hours.  Urinalysis: No results for input(s): COLORURINE, LABSPEC, PHURINE, GLUCOSEU, HGBUR, BILIRUBINUR, KETONESUR, PROTEINUR, UROBILINOGEN, NITRITE, LEUKOCYTESUR in the last 72 hours.  Invalid input(s): APPERANCEUR  Imaging: No results found.   Medications:    ceFEPime (MAXIPIME) IV Stopped (01/13/22 1835)     allopurinol  200 mg Oral Daily   aspirin EC  81 mg Oral Daily   atorvastatin  40 mg Oral QHS   calcium acetate  2,001 mg Oral TID WC   Chlorhexidine Gluconate Cloth  6 each Topical Q0600   cholecalciferol  2,000 Units Oral Daily   clopidogrel  75 mg Oral Q breakfast   docusate sodium  100 mg Oral Daily   gabapentin  300 mg Oral QHS   heparin  5,000 Units Subcutaneous Q8H   insulin aspart  0-5 Units Subcutaneous QHS   insulin aspart  0-6 Units Subcutaneous TID WC   isosorbide mononitrate  30 mg Oral Daily   metoprolol succinate  25 mg Oral Daily   multivitamin  1 tablet  Oral Daily   ondansetron (ZOFRAN) IV  4 mg Intravenous Once   pantoprazole  40 mg Oral Daily   polyethylene glycol  17 g Oral Daily   tamsulosin  0.4 mg Oral QPC supper   triamcinolone cream   Topical BID   umeclidinium-vilanterol  1 puff Inhalation Daily   albuterol, cyclobenzaprine, diphenhydrAMINE, hydrALAZINE, HYDROmorphone (DILAUDID) injection, ondansetron (ZOFRAN) IV, oxyCODONE  Assessment/ Plan:  [name] is a 56 y.o.  male ith a past medical history including GERD, anemia, chronic combined systolic and diastolic heart failure, diabetes, CAD, hypertension, sleep apnea, and end-stage renal disease on hemodialysis.  Patient presents to the emergency department with complaints of left-sided abdominal pain persisting for 2 to 3 days.  Patient has been admitted for Pyelonephritis [N12]  [facility] MWF [place]. Right AVF 120kg  End-stage renal disease on dialysis.   We will  maintain outpatient schedule during this admission.   Received last dialysis treatment On Friday. No need for renal replacement therapy today. Patient will receive next treatment scheduled for Monday  2. Anemia of chronic kidney disease Lab Results  Component Value Date   HGB 10.1 (L) 01/14/2022    Will monitor  3. Secondary Hyperparathyroidism: Lab Results  Component Value Date   PTH 369 (H) 06/16/2019   CALCIUM 8.9 01/14/2022   CAION 1.11 (L) 02/24/2020   PHOS 7.5 (H) 01/10/2022    Will continue to monitor bone minerals during this admission. Continue daily cholecalciferol and calcium acetate with meals.  4. Diabetes mellitus type II with chronic kidney disease: insulin dependent. Home regimen includes glipizide [place] and Trulicity. Most recent hemoglobin A1c is 7.1 on 05/09/21.     Primary team to manage sliding scale insulin.  5.  Acute pyelonephritis.  Perinephric stranding seen on CT renal stone and positive UA.  Urine culture with gram-negative rods.  Patient was treated with  cefepime  6.Hyperkalemia Patient potassium is on the higher side Patient is to get his dialysis tomorrow Patient wishes to go home Will give patient Milly Jakob today Educated patient to please not to miss his dialysis treatment I educated patient will need to restrict his high potassium diet    LOS: 6 Kennen Stammer s [facility] 1/29/20238:17 AM

## 2022-01-14 NOTE — Discharge Summary (Signed)
Physician Discharge Summary  [name]:[id number] DOB: 1966/02/16 DOA: 01/08/2022  PCP: [facility]  Admit date: 01/08/2022 Discharge date: 01/14/2022  Admitted From: home Disposition:  home   Recommendations for Outpatient Follow-up:  Follow up with PCP in 1-2 weeks F/u w/ nephro in 1-2 weeks  Home Health: no  Equipment/Devices: has chronic oxygen   Discharge Condition: stable  CODE STATUS: full  Diet recommendation: Heart Healthy / Carb Modified / Renal   Brief/Interim Summary: HPI was taken from Dr. Florina Ou: [name] is a 56 y.o. male seen in ed with complaints of pain in the left side , started Saturday am , while sleeping and , 10/10 and  NR, no nausea or vomiting, no fever or chills,he sat up from 10 minutes and he did that all day, could not sleep. No aggravating or alleviating factors. No hematuria  is negative.    Pt has past medical history of end-stage renal disease on hemodialysis, diabetes mellitus type 2, obesity. OSA on CPAP.   ED Course:         Vitals:    01/08/22 1630 01/08/22 1730 01/08/22 1800 01/08/22 1930  BP: 90/62 112/78 113/76 121/73  Pulse: 94 91 91 92  Resp: 18 18 18 18   Temp:          TempSrc:          SpO2: 93% 95% 97% 98%  Weight:          Height:          In ed pt is alert,awake and oriented and gives history.  Labs shows mild wbc count and cmp with creatinine of 7.20 with normal potassium and bicarb of 28 and AG of 11. U/A shows moderate hemoglobin large leukocytes more than 50 WBCs many bacteria, and CT imaging Bilateral perinephric stranding with asymmetric stranding about the left ureter and borderline to mild left hydronephrosis. No renal or ureteral calculi identified. Correlate for signs of upper tract infection.2. Aortic Atherosclerosis (ICD10-I70.0). In ed pt given rocephin and morphine for pain.    As per Dr. Leslye Peer:  56 year old man with past medical history of end-stage renal disease, type 2  diabetes, COPD, chronic systolic congestive heart failure, obstructive sleep apnea, pulmonary hypertension and anemia of chronic disease.  He presented 01/08/2022 with left-sided flank pain.  CT scan consistent with perinephric stranding and acute pyelonephritis.  No stone seen on CT scan.  Patient was started on antibiotics.   As per Dr. Jimmye Norman 1/25-1/29/23: Pt was found to have acute pyelonephritis secondary to enterobacter cloacae. Pt initially received IV rocephin and it was then switched to IV cefepime once culture were finalized. Pt was d/c w/ cipro w/ renally dosing to finish the course at home. Pt was able to ambulate and transfer independently so pt was not seen by therapy. For more information, please see previous progress/consult.   Discharge Diagnoses:  Principal Problem:   Acute left flank pain Active Problems:   CAD (coronary artery disease)   ESRD (end stage renal disease) on dialysis (HCC)   Diabetes mellitus without complication (HCC)   CHF (congestive heart failure) (HCC)   COPD (chronic obstructive pulmonary disease) (HCC)   HLD (hyperlipidemia)   Type 2 diabetes mellitus with hyperlipidemia (HCC)   Acute pyelonephritis   Pyelonephritis  Acute pyelonephritis: No stone seen on CT scan. Continue on IV cefepime. Urine cx is growing on enterobacter cloacae    ESRD: on HD. Management as per nephro  Chronic hypoxic respiratory failure: continue on supplemental oxygen.    Hyperkalemia:WNL today. Managed w/ HD    Constipation: resolved    COPD: w/o exacerbation. Continue on bronchodilators    Hx of CAD: continue on plavix, imdur, & aspirin    Chronic systolic CHF: continue on metoprolol, imdur & statin. Volume management w/ HD. Monitor I/Os. Had a cardiac MRI ordered as outpatient.   DM2: poorly controlled, HbA1c 8.3. Continue on SSI w/ accuchecks    HLD: continue on statin     BPH: continue on flomax    Hx of gout: continue on home dose of allopurinol     Diabetic neuropathy: continue on home dose of gabapentin    Psoriasis: continue on triamcinolone cream   Morbid obesity: BMI 45.8. Complicates overall care & prognosis    Discharge Instructions  Discharge Instructions     Diet - low sodium heart healthy   Complete by: As directed    Diet Carb Modified   Complete by: As directed    Discharge instructions   Complete by: As directed    F/u w/ PCP in 1-2 weeks. F/u w/ nephro in 1-2 weeks   Increase activity slowly   Complete by: As directed       Allergies as of 01/14/2022       Reactions   Shellfish Allergy Anaphylaxis   Face and throat swelling, difficulty breathing Allergy can be triggered by touching (contact)   Betadine [povidone Iodine] Rash   Povidone-iodine Rash        Medication List     TAKE these medications    albuterol 108 (90 Base) MCG/ACT inhaler Commonly known as: VENTOLIN HFA Inhale 2 puffs into the lungs every 4 (four) hours as needed for wheezing or shortness of breath.   allopurinol 100 MG tablet Commonly known as: ZYLOPRIM Take 100 mg by mouth daily.   Anoro Ellipta 62.5-25 MCG/ACT Aepb Generic drug: umeclidinium-vilanterol INHALE 1 PUFF INTO THE LUNGS DAILY.   aspirin EC 81 MG tablet Take 81 mg by mouth in the morning. Swallow whole.   atorvastatin 40 MG tablet Commonly known as: LIPITOR Take 1 tablet (40 mg total) by mouth at bedtime.   calcium acetate 667 MG capsule Commonly known as: PHOSLO Take 667 mg by mouth 3 (three) times daily with meals.   Cholecalciferol 25 MCG (1000 UT) tablet Commonly known as: D3-1000 Take 2 tablets (2,000 Units total) by mouth daily.   ciprofloxacin 500 MG tablet Commonly known as: Cipro Take 1 tablet (500 mg total) by mouth daily for 5 days.   clopidogrel 75 MG tablet Commonly known as: PLAVIX Take 1 tablet (75 mg total) by mouth daily with breakfast.   cyclobenzaprine 5 MG tablet Commonly known as: FLEXERIL Take 1 tablet (5 mg total) by  mouth 2 (two) times daily as needed for muscle spasms.   docusate sodium 100 MG capsule Commonly known as: Colace Take 1 capsule (100 mg total) by mouth daily.   Entresto 49-51 MG Generic drug: sacubitril-valsartan Take 1 tablet by mouth 2 (two) times daily. STOP taking your Losartan and START taking Entresto.   gabapentin 300 MG capsule Commonly known as: NEURONTIN Take 300 mg by mouth at bedtime.   glimepiride 4 MG tablet Commonly known as: AMARYL Take 4 mg by mouth daily with breakfast.   isosorbide mononitrate 30 MG 24 hr tablet Commonly known as: IMDUR Take 1 tablet (30 mg total) by mouth daily.   metoprolol succinate 25 MG 24 hr tablet  Commonly known as: TOPROL-XL Take 25 mg by mouth daily.   OXYGEN Inhale 3 L into the lungs daily.   pantoprazole 40 MG tablet Commonly known as: PROTONIX Take 1 tablet (40 mg total) by mouth daily.   silodosin 4 MG Caps capsule Commonly known as: RAPAFLO TAKE 1 CAPSULE ONCE DAILY WITH BREAKFAST   Skyrizi 150 MG/ML Sosy Generic drug: Risankizumab-rzaa Inject 150 mg into the skin as directed. Every 12 weeks for maintenance.   Tab-A-Vite Tabs TAKE 1 TABLET BY MOUTH ONCE DAILY.   Torsemide 40 MG Tabs Take TWO tablets (80 mg) TWICE daily only on days you DO NOT have dialysis: Tuesday, Thursday, Saturday and Sunday   Tresiba FlexTouch 100 UNIT/ML FlexTouch Pen Generic drug: insulin degludec Inject 20 Units into the skin at bedtime.   triamcinolone 0.025 % cream Commonly known as: KENALOG Apply 1 application topically daily in the afternoon.   Trulicity 3 TD/1.7OH Sopn Generic drug: Dulaglutide Inject 3 mg into the skin every Thursday.        Allergies  Allergen Reactions   Shellfish Allergy Anaphylaxis    Face and throat swelling, difficulty breathing Allergy can be triggered by touching (contact)   Betadine [Povidone Iodine] Rash   Povidone-Iodine Rash    Consultations: Nephro    Procedures/Studies: CT Renal  Stone Study  Result Date: 01/08/2022 CLINICAL DATA:  Left flank pain.  On dialysis.  Hematuria. EXAM: CT ABDOMEN AND PELVIS WITHOUT CONTRAST TECHNIQUE: Multidetector CT imaging of the abdomen and pelvis was performed following the standard protocol without IV contrast. RADIATION DOSE REDUCTION: This exam was performed according to the departmental dose-optimization program which includes automated exposure control, adjustment of the mA and/or kV according to patient size and/or use of iterative reconstruction technique. COMPARISON:  Renal ultrasound 06/16/2019 FINDINGS: Lower chest: Scarring or atelectasis in the lung bases. Coronary atherosclerosis. No pleural effusion. Hepatobiliary: No focal liver abnormality is seen. No gallstones, gallbladder wall thickening, or biliary dilatation. Pancreas: No pancreatic ductal dilatation or acute inflammation. Suspected vascular calcification in the region of the pancreatic head. Spleen: Unremarkable. Adrenals/Urinary Tract: Unremarkable adrenal glands. Prominent symmetric bilateral perinephric stranding. Borderline to mild left hydronephrosis. Asymmetric stranding about the left ureter. No renal or ureteral calculi identified. Unremarkable bladder. Stomach/Bowel: The stomach is unremarkable. There is no evidence of bowel obstruction or inflammation. The appendix is unremarkable. Vascular/Lymphatic: Abdominal aortic atherosclerosis without aneurysm. No enlarged lymph nodes. Reproductive: Unremarkable prostate. 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 Other: No ascites or pneumoperitoneum. Small fat containing umbilical hernia. Musculoskeletal: No acute osseous abnormality or suspicious osseous lesion. Advanced lower lumbar facet arthrosis. IMPRESSION: 1. Bilateral perinephric stranding with asymmetric stranding about the left ureter and borderline to mild left hydronephrosis. No renal or ureteral calculi identified. Correlate for  signs of upper tract infection. 2. Aortic Atherosclerosis (ICD10-I70.0). Electronically Signed   By: Logan Bores M.D.   On: 01/08/2022 16:08   (Echo, Carotid, EGD, Colonoscopy, ERCP)    Subjective: Pt denies complaints   Discharge Exam: Vitals:   01/13/22 2019 01/14/22 0745  BP: 139/87 127/88  Pulse: 82 84  Resp: 16 18  Temp: 97.8 F (36.6 C) 98 F (36.7 C)  SpO2: 97% 99%   Vitals:   01/13/22 0807 01/13/22 1626 01/13/22 2019 01/14/22 0745  BP: 123/70 116/79 139/87 127/88  Pulse: 91 88 82 84  Resp: 18 18 16 18   Temp: 98.9 F (37.2 C) 98.6 F (37 C) 97.8 F (36.6 C)  42 F (36.7 C)  TempSrc:   Oral Oral  SpO2: 99% 94% 97% 99%  Weight:      Height:        General: Pt is alert, awake, not in acute distress Cardiovascular: S1/S2 +, no rubs, no gallops Respiratory: CTA bilaterally, no wheezing, no rhonchi Abdominal: Soft, NT, obese, bowel sounds + Extremities: no cyanosis    The results of significant diagnostics from this hospitalization (including imaging, microbiology, ancillary and laboratory) are listed below for reference.     Microbiology: Recent Results (from the past 240 hour(s))  Urine Culture     Status: Abnormal   Collection Time: 01/08/22  5:25 PM   Specimen: Urine, Clean Catch  Result Value Ref Range Status   Specimen Description   Final    URINE, CLEAN CATCH Performed at [facility], [address] [place], [place] [address]    Special Requests   Final    NONE Performed at [facility], [place], [place] [address]    Culture >=100,000 COLONIES/mL ENTEROBACTER CLOACAE (A)  Final   Report Status 01/11/2022 FINAL  Final   Organism ID, Bacteria ENTEROBACTER CLOACAE (A)  Final      Susceptibility   Enterobacter cloacae - MIC*    CEFAZOLIN RESISTANT Resistant     CEFEPIME <=0.12 SENSITIVE Sensitive     CIPROFLOXACIN <=0.25 SENSITIVE Sensitive     GENTAMICIN <=1 SENSITIVE Sensitive     IMIPENEM <=0.25 SENSITIVE  Sensitive     NITROFURANTOIN 32 SENSITIVE Sensitive     TRIMETH/SULFA <=20 SENSITIVE Sensitive     PIP/TAZO <=4 SENSITIVE Sensitive     * >=100,000 COLONIES/mL ENTEROBACTER CLOACAE  Resp Panel by RT-PCR (Flu A&B, Covid) Nasopharyngeal Swab     Status: None   Collection Time: 01/08/22  7:55 PM   Specimen: Nasopharyngeal Swab; Nasopharyngeal(NP) swabs in vial transport medium  Result Value Ref Range Status   SARS Coronavirus 2 by RT PCR NEGATIVE NEGATIVE Final    Comment: (NOTE) SARS-CoV-2 target nucleic acids are NOT DETECTED.  The SARS-CoV-2 RNA is generally detectable in upper respiratory specimens during the acute phase of infection. The lowest concentration of SARS-CoV-2 viral copies this assay can detect is 138 copies/mL. A negative result does not preclude SARS-Cov-2 infection and should not be used as the sole basis for treatment or other patient management decisions. A negative result may occur with  improper specimen collection/handling, submission of specimen other than nasopharyngeal swab, presence of viral mutation(s) within the areas targeted by this assay, and inadequate number of viral copies(<138 copies/mL). A negative result must be combined with clinical observations, patient history, and epidemiological information. The expected result is Negative.  Fact Sheet for Patients:  EntrepreneurPulse.com.au  Fact Sheet for Healthcare Providers:  IncredibleEmployment.be  This test is no t yet approved or cleared by the [place] FDA and  has been authorized for detection and/or diagnosis of SARS-CoV-2 by FDA under an Emergency Use Authorization (EUA). This EUA will remain  in effect (meaning this test can be used) for the duration of the COVID-19 declaration under Section 564(b)(1) of the Act, 21 U.S.C.section 360bbb-3(b)(1), unless the authorization is terminated  or revoked sooner.       Influenza A by PCR NEGATIVE NEGATIVE  Final   Influenza B by PCR NEGATIVE NEGATIVE Final    Comment: (NOTE) The Xpert Xpress SARS-CoV-2/FLU/RSV plus assay is intended as an aid in the diagnosis of influenza from Nasopharyngeal swab specimens and should not  be used as a sole basis for treatment. Nasal washings and aspirates are unacceptable for Xpert Xpress SARS-CoV-2/FLU/RSV testing.  Fact Sheet for Patients: EntrepreneurPulse.com.au  Fact Sheet for Healthcare Providers: IncredibleEmployment.be  This test is not yet approved or cleared by the [place] FDA and has been authorized for detection and/or diagnosis of SARS-CoV-2 by FDA under an Emergency Use Authorization (EUA). This EUA will remain in effect (meaning this test can be used) for the duration of the COVID-19 declaration under Section 564(b)(1) of the Act, 21 U.S.C. section 360bbb-3(b)(1), unless the authorization is terminated or revoked.  Performed at [facility], [address] [place], [place] [address]      Labs: BNP (last 3 results) Recent Labs    01/08/22 1246  BNP 846.9*   Basic Metabolic Panel: Recent Labs  Lab 01/10/22 0842 01/11/22 0534 01/12/22 0532 01/13/22 0349 01/14/22 0326  NA 132* 132* 131* 136 134*  K 7.0* 5.4* 5.7* 5.0 5.4*  CL 91* 92* 90* 94* 93*  CO2 28 28 28 30 27   GLUCOSE 159* 133* 133* 154* 144*  BUN 64* 38* 54* 38* 52*  CREATININE 13.39* 9.95* 12.44* 9.42* 11.31*  CALCIUM 8.7* 9.0 8.7* 9.2 8.9  PHOS 7.5*  --   --   --   --    Liver Function Tests: Recent Labs  Lab 01/10/22 0842  ALBUMIN 3.5   No results for input(s): LIPASE, AMYLASE in the last 168 hours. No results for input(s): AMMONIA in the last 168 hours. CBC: Recent Labs  Lab 01/10/22 0842 01/11/22 0534 01/12/22 0532 01/13/22 0349 01/14/22 0326  WBC 11.7* 9.3 8.4 8.2 8.0  HGB 11.9* 11.2* 10.6* 10.5* 10.1*  HCT 36.4* 37.0* 34.9* 34.1* 32.4*  MCV 91.7 93.7 93.1 92.2 90.3  PLT 208 185 189 205  212   Cardiac Enzymes: No results for input(s): CKTOTAL, CKMB, CKMBINDEX, TROPONINI in the last 168 hours. BNP: Invalid input(s): POCBNP CBG: Recent Labs  Lab 01/13/22 1206 01/13/22 1626 01/13/22 2046 01/14/22 0741 01/14/22 1144  GLUCAP 137* 165* 138* 132* 141*   D-Dimer No results for input(s): DDIMER in the last 72 hours. Hgb A1c No results for input(s): HGBA1C in the last 72 hours. Lipid Profile No results for input(s): CHOL, HDL, LDLCALC, TRIG, CHOLHDL, LDLDIRECT in the last 72 hours. Thyroid function studies No results for input(s): TSH, T4TOTAL, T3FREE, THYROIDAB in the last 72 hours.  Invalid input(s): FREET3 Anemia work up No results for input(s): VITAMINB12, FOLATE, FERRITIN, TIBC, IRON, RETICCTPCT in the last 72 hours. Urinalysis    Component Value Date/Time   COLORURINE YELLOW 01/08/2022 1725   APPEARANCEUR CLOUDY (A) 01/08/2022 1725   APPEARANCEUR Clear 08/11/2019 0958   LABSPEC 1.015 01/08/2022 1725   PHURINE 5.0 01/08/2022 1725   GLUCOSEU NEGATIVE 01/08/2022 1725   HGBUR MODERATE (A) 01/08/2022 1725   BILIRUBINUR SMALL (A) 01/08/2022 1725   BILIRUBINUR Negative 08/11/2019 0958   KETONESUR TRACE (A) 01/08/2022 1725   PROTEINUR >300 (A) 01/08/2022 1725   NITRITE NEGATIVE 01/08/2022 1725   LEUKOCYTESUR LARGE (A) 01/08/2022 1725   Sepsis Labs Invalid input(s): PROCALCITONIN,  WBC,  LACTICIDVEN Microbiology Recent Results (from the past 240 hour(s))  Urine Culture     Status: Abnormal   Collection Time: 01/08/22  5:25 PM   Specimen: Urine, Clean Catch  Result Value Ref Range Status   Specimen Description   Final    URINE, CLEAN CATCH Performed at [facility], [address], [place], [place] [address]  Special Requests   Final    NONE Performed at [facility], [address] [place], [place] [address]    Culture >=100,000 COLONIES/mL ENTEROBACTER CLOACAE (A)  Final   Report Status 01/11/2022 FINAL  Final   Organism ID,  Bacteria ENTEROBACTER CLOACAE (A)  Final      Susceptibility   Enterobacter cloacae - MIC*    CEFAZOLIN RESISTANT Resistant     CEFEPIME <=0.12 SENSITIVE Sensitive     CIPROFLOXACIN <=0.25 SENSITIVE Sensitive     GENTAMICIN <=1 SENSITIVE Sensitive     IMIPENEM <=0.25 SENSITIVE Sensitive     NITROFURANTOIN 32 SENSITIVE Sensitive     TRIMETH/SULFA <=20 SENSITIVE Sensitive     PIP/TAZO <=4 SENSITIVE Sensitive     * >=100,000 COLONIES/mL ENTEROBACTER CLOACAE  Resp Panel by RT-PCR (Flu A&B, Covid) Nasopharyngeal Swab     Status: None   Collection Time: 01/08/22  7:55 PM   Specimen: Nasopharyngeal Swab; Nasopharyngeal(NP) swabs in vial transport medium  Result Value Ref Range Status   SARS Coronavirus 2 by RT PCR NEGATIVE NEGATIVE Final    Comment: (NOTE) SARS-CoV-2 target nucleic acids are NOT DETECTED.  The SARS-CoV-2 RNA is generally detectable in upper respiratory specimens during the acute phase of infection. The lowest concentration of SARS-CoV-2 viral copies this assay can detect is 138 copies/mL. A negative result does not preclude SARS-Cov-2 infection and should not be used as the sole basis for treatment or other patient management decisions. A negative result may occur with  improper specimen collection/handling, submission of specimen other than nasopharyngeal swab, presence of viral mutation(s) within the areas targeted by this assay, and inadequate number of viral copies(<138 copies/mL). A negative result must be combined with clinical observations, patient history, and epidemiological information. The expected result is Negative.  Fact Sheet for Patients:  EntrepreneurPulse.com.au  Fact Sheet for Healthcare Providers:  IncredibleEmployment.be  This test is no t yet approved or cleared by the [place] FDA and  has been authorized for detection and/or diagnosis of SARS-CoV-2 by FDA under an Emergency Use Authorization (EUA). This  EUA will remain  in effect (meaning this test can be used) for the duration of the COVID-19 declaration under Section 564(b)(1) of the Act, 21 U.S.C.section 360bbb-3(b)(1), unless the authorization is terminated  or revoked sooner.       Influenza A by PCR NEGATIVE NEGATIVE Final   Influenza B by PCR NEGATIVE NEGATIVE Final    Comment: (NOTE) The Xpert Xpress SARS-CoV-2/FLU/RSV plus assay is intended as an aid in the diagnosis of influenza from Nasopharyngeal swab specimens and should not be used as a sole basis for treatment. Nasal washings and aspirates are unacceptable for Xpert Xpress SARS-CoV-2/FLU/RSV testing.  Fact Sheet for Patients: EntrepreneurPulse.com.au  Fact Sheet for Healthcare Providers: IncredibleEmployment.be  This test is not yet approved or cleared by the [place] FDA and has been authorized for detection and/or diagnosis of SARS-CoV-2 by FDA under an Emergency Use Authorization (EUA). This EUA will remain in effect (meaning this test can be used) for the duration of the COVID-19 declaration under Section 564(b)(1) of the Act, 21 U.S.C. section 360bbb-3(b)(1), unless the authorization is terminated or revoked.  Performed at [facility], [address], [place], [place] [address]      Time coordinating discharge: Over 30 minutes  SIGNED:   Wyvonnia Dusky, MD  [facility] 01/14/2022, 4:49 PM Pager   If 7PM-7AM, please contact night-coverage

## 2022-01-14 NOTE — TOC Transition Note (Signed)
Transition of Care [facility]) - CM/SW Discharge Note   Patient Details  Name: [name] MRN: [id number] Date of Birth: [birth year]  [facility] CM/SW Contact:  Izola Price, RN Phone Number: 01/14/2022, 2:09 PM   Clinical Narrative: 1/29: Patient admitted 01/08/22 for acute left flank pain then dx with acute pyelonephritis and +urine cultures. Patient is known HD patient followed by nephrology via [facility] and during admission.  No TOC consult and no needs identified for discharge plan. Please contact TOC if any needs arise prior to discharge. Discharge orders just in. Simmie Davies RN CM  [facility] PCP Kate Sable: Cardiology Lars Mage: Electrophysiology [facility] [address] [place], [place] [address] [name], [address] [place] TRANSITIONS OF CARE PHARMACY Final next level of care: Home/Self Care Barriers to Discharge: Barriers Resolved   Patient Goals and CMS Choice        Discharge Placement                       Discharge Plan and Services                DME Arranged: N/A DME Agency: NA       HH Arranged: NA HH Agency: NA        Social Determinants of Health (SDOH) Interventions     Readmission Risk Interventions No flowsheet data found.

## 2022-01-17 ENCOUNTER — Ambulatory Visit: Payer: Medicare Other | Admitting: Cardiology

## 2022-01-18 ENCOUNTER — Other Ambulatory Visit: Payer: Self-pay

## 2022-01-18 ENCOUNTER — Ambulatory Visit (INDEPENDENT_AMBULATORY_CARE_PROVIDER_SITE_OTHER): Payer: Medicare Other | Admitting: Urology

## 2022-01-18 ENCOUNTER — Encounter: Payer: Self-pay | Admitting: Urology

## 2022-01-18 VITALS — BP 110/70 | HR 90 | Ht 63.0 in | Wt 264.0 lb

## 2022-01-18 DIAGNOSIS — N401 Enlarged prostate with lower urinary tract symptoms: Secondary | ICD-10-CM

## 2022-01-18 DIAGNOSIS — R3911 Hesitancy of micturition: Secondary | ICD-10-CM | POA: Diagnosis not present

## 2022-01-18 MED ORDER — SILODOSIN 4 MG PO CAPS: ORAL_CAPSULE | ORAL | 3 refills | Status: AC

## 2022-01-18 NOTE — Progress Notes (Signed)
01/18/2022 10:50 AM   [name] 1966/11/16 [id number]  Referring provider: Care, [facility] [place],  [place] [address]  Chief Complaint  Patient presents with   Cysto    HPI: 56 y.o. male presents for follow-up visit.  Initial visit 12/07/2021 with a 42-month history of obstructive voiding symptoms Given a trial of silodosin and noted significant improvement in the force and caliber of his urinary stream and significant decrease in urinary hesitancy ED visit 01/08/2022 with complaints of left flank pain and admitted for pyelonephritis Urine culture grew Enterobacter and symptoms resolved on antibiotic therapy Noncontrast CT performed during that hospitalization showed no hydronephrosis, renal calculi.  Bilateral perinephric stranding was noted No complaints today   PMH: Past Medical History:  Diagnosis Date   Anemia of chronic disease    Bell's palsy    Cataract    Chronic combined systolic (congestive) and diastolic (congestive) heart failure (HCC)    Chronic respiratory failure with hypoxia (HCC)    3 lpm continuous   COPD (chronic obstructive pulmonary disease) (HCC)    Coronary artery disease    Diabetes mellitus without complication (HCC)    Dyspnea    Wheezing   ESRD on hemodialysis (HCC)    GERD (gastroesophageal reflux disease)    Gout    HOH (hard of hearing)    mild   Hypercholesterolemia    Hypertension    NSVT (nonsustained ventricular tachycardia)    Obstructive sleep apnea    CPAP, O2 use continuosly at 3LPM   Orthopnea    Pneumonia    In Past X2 most recent 1 yr ago   Psoriasis    Pulmonary HTN ([place])    Ventricular fibrillation [facility])     Surgical History: Past Surgical History:  Procedure Laterality Date   A/V FISTULAGRAM Right 05/02/2021   Procedure: A/V FISTULAGRAM;  Surgeon: Katha Cabal, MD;  Location: [place] [facility];  Service: Cardiovascular;  Laterality: Right;   AV FISTULA PLACEMENT Right 11/20/2019    Procedure: ARTERIOVENOUS (AV) FISTULA CREATION ( BRACHIAL CEPHALIC );  Surgeon: Katha Cabal, MD;  Location: [facility];  Service: Vascular;  Laterality: Right;   CATARACT EXTRACTION W/PHACO Left 12/17/2019   Procedure: CATARACT EXTRACTION PHACO AND INTRAOCULAR LENS PLACEMENT ([place]) LEFT ISTENT INJ DIABETIC;  Surgeon: Eulogio Bear, MD;  Location: [facility];  Service: Ophthalmology;  Laterality: Left;  [place] 00:33.2 CDE 2.96 Fluid Pack Lot # [id number]   CATARACT EXTRACTION W/PHACO Right 02/24/2020   Procedure: CATARACT EXTRACTION PHACO AND INTRAOCULAR LENS PLACEMENT (I[facility]) RIGHT DIABETIC ISTENT INJ;  Surgeon: Eulogio Bear, MD;  Location: [facility];  Service: Ophthalmology;  Laterality: Right;  [place] 00:37.3 CDE 2.88 Fluid Pack Lot # [id number]   CORONARY ANGIOPLASTY WITH STENT PLACEMENT     stent placement   CORONARY STENT INTERVENTION N/A 08/28/2021   Procedure: CORONARY STENT INTERVENTION;  Surgeon: Nelva Bush, MD;  Location: [facility];  Service: Cardiovascular;  Laterality: N/A;   DIALYSIS/PERMA CATHETER INSERTION N/A 06/22/2019   Procedure: DIALYSIS/PERMA CATHETER INSERTION;  Surgeon: Algernon Huxley, MD;  Location: [place] [facility];  Service: Cardiovascular;  Laterality: N/A;   DIALYSIS/PERMA CATHETER REMOVAL N/A 05/31/2020   Procedure: DIALYSIS/PERMA CATHETER REMOVAL;  Surgeon: Katha Cabal, MD;  Location: [facility];  Service: Cardiovascular;  Laterality: N/A;   INSERTION OF AHMED VALVE Left 12/17/2019   Procedure: INSERTION OF iSTENT;  Surgeon: Eulogio Bear, MD;  Location: [facility];  Service: Ophthalmology;  Laterality: Left;   INTRAVASCULAR IMAGING/OCT N/A 08/28/2021   Procedure: INTRAVASCULAR IMAGING/OCT;  Surgeon: Nelva Bush, MD;  Location: [facility];  Service: Cardiovascular;  Laterality: N/A;   LEFT HEART CATH AND CORONARY ANGIOGRAPHY N/A 08/08/2021   Procedure: LEFT HEART CATH AND CORONARY ANGIOGRAPHY;  Surgeon: Nelva Bush,  MD;  Location: [facility];  Service: Cardiovascular;  Laterality: N/A;    Home Medications:  Allergies as of 01/18/2022       Reactions   Shellfish Allergy Anaphylaxis   Face and throat swelling, difficulty breathing Allergy can be triggered by touching (contact)   Betadine [povidone Iodine] Rash   Povidone-iodine Rash        Medication List        Accurate as of January 18, 2022 10:50 AM. If you have any questions, ask your nurse or doctor.          albuterol 108 (90 Base) MCG/ACT inhaler Commonly known as: VENTOLIN HFA Inhale 2 puffs into the lungs every 4 (four) hours as needed for wheezing or shortness of breath.   allopurinol 100 MG tablet Commonly known as: ZYLOPRIM Take 100 mg by mouth daily.   Anoro Ellipta 62.5-25 MCG/ACT Aepb Generic drug: umeclidinium-vilanterol INHALE 1 PUFF INTO THE LUNGS DAILY.   aspirin EC 81 MG tablet Take 81 mg by mouth in the morning. Swallow whole.   atorvastatin 40 MG tablet Commonly known as: LIPITOR Take 1 tablet (40 mg total) by mouth at bedtime.   calcium acetate 667 MG capsule Commonly known as: PHOSLO Take 667 mg by mouth 3 (three) times daily with meals.   Cholecalciferol 25 MCG (1000 UT) tablet Commonly known as: D3-1000 Take 2 tablets (2,000 Units total) by mouth daily.   ciprofloxacin 500 MG tablet Commonly known as: Cipro Take 1 tablet (500 mg total) by mouth daily for 5 days.   clopidogrel 75 MG tablet Commonly known as: PLAVIX Take 1 tablet (75 mg total) by mouth daily with breakfast.   cyclobenzaprine 5 MG tablet Commonly known as: FLEXERIL Take 1 tablet (5 mg total) by mouth 2 (two) times daily as needed for muscle spasms.   docusate sodium 100 MG capsule Commonly known as: Colace Take 1 capsule (100 mg total) by mouth daily.   Entresto 49-51 MG Generic drug: sacubitril-valsartan Take 1 tablet by mouth 2 (two) times daily. STOP taking your Losartan and START taking Entresto.    gabapentin 300 MG capsule Commonly known as: NEURONTIN Take 300 mg by mouth at bedtime.   glimepiride 4 MG tablet Commonly known as: AMARYL Take 4 mg by mouth daily with breakfast.   isosorbide mononitrate 30 MG 24 hr tablet Commonly known as: IMDUR Take 1 tablet (30 mg total) by mouth daily.   metoprolol succinate 25 MG 24 hr tablet Commonly known as: TOPROL-XL Take 25 mg by mouth daily.   OXYGEN Inhale 3 L into the lungs daily.   pantoprazole 40 MG tablet Commonly known as: PROTONIX Take 1 tablet (40 mg total) by mouth daily.   silodosin 4 MG Caps capsule Commonly known as: RAPAFLO TAKE 1 CAPSULE ONCE DAILY WITH BREAKFAST   Skyrizi 150 MG/ML Sosy Generic drug: Risankizumab-rzaa Inject 150 mg into the skin as directed. Every 12 weeks for maintenance.   Tab-A-Vite Tabs TAKE 1 TABLET BY MOUTH ONCE DAILY.   Torsemide 40 MG Tabs Take TWO tablets (80 mg) TWICE daily only on days you DO NOT have dialysis: Tuesday, Thursday, Saturday and Sunday  Tyler Aas FlexTouch 100 UNIT/ML FlexTouch Pen Generic drug: insulin degludec Inject 20 Units into the skin at bedtime.   triamcinolone 0.025 % cream Commonly known as: KENALOG Apply 1 application topically daily in the afternoon.   Trulicity 3 JA/2.5KN Sopn Generic drug: Dulaglutide Inject 3 mg into the skin every Thursday.        Allergies:  Allergies  Allergen Reactions   Shellfish Allergy Anaphylaxis    Face and throat swelling, difficulty breathing Allergy can be triggered by touching (contact)   Betadine [Povidone Iodine] Rash   Povidone-Iodine Rash    Family History: Family History  Problem Relation Age of [place]   Heart failure Mother    Kidney failure Brother     Social History:  reports that he has never smoked. He has never used smokeless tobacco. He reports current alcohol use of about 1.0 standard drink per week. He reports that he does not currently use drugs after having used the following drugs:  Marijuana.   Physical Exam: BP 110/70    Pulse 90    Ht 5\' 3"  (1.6 m)    Wt 264 lb (119.7 kg)    BMI 46.77 kg/m   Constitutional:  Alert and oriented, No acute distress. HEENT: [place] AT, moist mucus membranes.  Trachea midline, no masses. Cardiovascular: No clubbing, cyanosis, or edema. Psychiatric: Normal mood and affect.   Pertinent Imaging: CT 01/08/2022 images were personally reviewed and interpreted  CT Renal Stone Study  Narrative CLINICAL DATA:  Left flank pain.  On dialysis.  Hematuria.  EXAM: CT ABDOMEN AND PELVIS WITHOUT CONTRAST  TECHNIQUE: Multidetector CT imaging of the abdomen and pelvis was performed following the standard protocol without IV contrast.  RADIATION DOSE REDUCTION: This exam was performed according to the departmental dose-optimization program which includes automated exposure control, adjustment of the mA and/or kV according to patient size and/or use of iterative reconstruction technique.  COMPARISON:  Renal ultrasound 06/16/2019  FINDINGS: Lower chest: Scarring or atelectasis in the lung bases. Coronary atherosclerosis. No pleural effusion.  Hepatobiliary: No focal liver abnormality is seen. No gallstones, gallbladder wall thickening, or biliary dilatation.  Pancreas: No pancreatic ductal dilatation or acute inflammation. Suspected vascular calcification in the region of the pancreatic head.  Spleen: Unremarkable.  Adrenals/Urinary Tract: Unremarkable adrenal glands. Prominent symmetric bilateral perinephric stranding. Borderline to mild left hydronephrosis. Asymmetric stranding about the left ureter. No renal or ureteral calculi identified. Unremarkable bladder.  Stomach/Bowel: The stomach is unremarkable. There is no evidence of bowel obstruction or inflammation. The appendix is unremarkable.  Vascular/Lymphatic: Abdominal aortic atherosclerosis without aneurysm. No enlarged lymph nodes.  Reproductive: Unremarkable prostate.  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  Other: No ascites or pneumoperitoneum. Small fat containing umbilical hernia.  Musculoskeletal: No acute osseous abnormality or suspicious osseous lesion. Advanced lower lumbar facet arthrosis.  IMPRESSION: 1. Bilateral perinephric stranding with asymmetric stranding about the left ureter and borderline to mild left hydronephrosis. No renal or ureteral calculi identified. Correlate for signs of upper tract infection. 2. Aortic Atherosclerosis (ICD10-I70.0).   Electronically Signed By: Logan Bores M.D. On: 01/08/2022 16:08   Assessment & Plan:    1.  BPH with LUTS Marked improvement in obstructive voiding symptoms on silodosin which was refilled Annual follow-up and instructed to call earlier for any significant change in his voiding pattern  2.  Pyelonephritis Reason hospitalization with left flank pain and positive urine culture.  Resolved with antibiotics  3.  Pelvic  mass At last visit a prior renal ultrasound report in 2020 included a 5 cm anechoic mass with calcification.  No pelvic mass on CT but a collapsed penile prosthesis reservoir    Abbie Sons, MD  [facility] [address] [place], [place] [address] [phone]

## 2022-01-23 ENCOUNTER — Telehealth: Payer: Self-pay

## 2022-01-23 ENCOUNTER — Ambulatory Visit (INDEPENDENT_AMBULATORY_CARE_PROVIDER_SITE_OTHER): Payer: Medicare Other | Admitting: Nurse Practitioner

## 2022-01-23 ENCOUNTER — Encounter: Payer: Self-pay | Admitting: Nurse Practitioner

## 2022-01-23 ENCOUNTER — Other Ambulatory Visit: Payer: Self-pay

## 2022-01-23 VITALS — BP 112/63 | HR 81 | Temp 98.1°F | Ht 63.5 in | Wt 256.2 lb

## 2022-01-23 DIAGNOSIS — I429 Cardiomyopathy, unspecified: Secondary | ICD-10-CM

## 2022-01-23 DIAGNOSIS — G4700 Insomnia, unspecified: Secondary | ICD-10-CM | POA: Insufficient documentation

## 2022-01-23 DIAGNOSIS — G4733 Obstructive sleep apnea (adult) (pediatric): Secondary | ICD-10-CM

## 2022-01-23 DIAGNOSIS — J9611 Chronic respiratory failure with hypoxia: Secondary | ICD-10-CM

## 2022-01-23 DIAGNOSIS — E113299 Type 2 diabetes mellitus with mild nonproliferative diabetic retinopathy without macular edema, unspecified eye: Secondary | ICD-10-CM

## 2022-01-23 DIAGNOSIS — F5104 Psychophysiologic insomnia: Secondary | ICD-10-CM

## 2022-01-23 DIAGNOSIS — Z794 Long term (current) use of insulin: Secondary | ICD-10-CM

## 2022-01-23 DIAGNOSIS — Z992 Dependence on renal dialysis: Secondary | ICD-10-CM

## 2022-01-23 DIAGNOSIS — E785 Hyperlipidemia, unspecified: Secondary | ICD-10-CM

## 2022-01-23 DIAGNOSIS — E662 Morbid (severe) obesity with alveolar hypoventilation: Secondary | ICD-10-CM

## 2022-01-23 DIAGNOSIS — I25118 Atherosclerotic heart disease of native coronary artery with other forms of angina pectoris: Secondary | ICD-10-CM

## 2022-01-23 DIAGNOSIS — Z1211 Encounter for screening for malignant neoplasm of colon: Secondary | ICD-10-CM

## 2022-01-23 DIAGNOSIS — Z7689 Persons encountering health services in other specified circumstances: Secondary | ICD-10-CM

## 2022-01-23 DIAGNOSIS — Z6841 Body Mass Index (BMI) 40.0 and over, adult: Secondary | ICD-10-CM

## 2022-01-23 DIAGNOSIS — I5042 Chronic combined systolic (congestive) and diastolic (congestive) heart failure: Secondary | ICD-10-CM | POA: Diagnosis not present

## 2022-01-23 DIAGNOSIS — I4901 Ventricular fibrillation: Secondary | ICD-10-CM

## 2022-01-23 DIAGNOSIS — E1142 Type 2 diabetes mellitus with diabetic polyneuropathy: Secondary | ICD-10-CM

## 2022-01-23 DIAGNOSIS — E1169 Type 2 diabetes mellitus with other specified complication: Secondary | ICD-10-CM

## 2022-01-23 DIAGNOSIS — J449 Chronic obstructive pulmonary disease, unspecified: Secondary | ICD-10-CM | POA: Diagnosis not present

## 2022-01-23 DIAGNOSIS — N186 End stage renal disease: Secondary | ICD-10-CM

## 2022-01-23 DIAGNOSIS — E1165 Type 2 diabetes mellitus with hyperglycemia: Secondary | ICD-10-CM

## 2022-01-23 DIAGNOSIS — M25551 Pain in right hip: Secondary | ICD-10-CM | POA: Insufficient documentation

## 2022-01-23 DIAGNOSIS — M1A09X0 Idiopathic chronic gout, multiple sites, without tophus (tophi): Secondary | ICD-10-CM

## 2022-01-23 MED ORDER — TIZANIDINE HCL 2 MG PO TABS: 2.0000 mg | ORAL_TABLET | Freq: Every day | ORAL | 3 refills | Status: AC | PRN

## 2022-01-23 MED ORDER — BELSOMRA 5 MG PO TABS: 5.0000 mg | ORAL_TABLET | Freq: Every evening | ORAL | 3 refills | Status: DC | PRN

## 2022-01-23 NOTE — Patient Instructions (Addendum)
PLEASE HAVE DIALYSIS LABS SENT TO ME VIA FAX + ADD ON B12 and TSH.  Diabetes Mellitus and Nutrition, Adult When you have diabetes, or diabetes mellitus, it is very important to have healthy eating habits because your blood sugar (glucose) levels are greatly affected by what you eat and drink. Eating healthy foods in the right amounts, at about the same times every day, can help you: Manage your blood glucose. Lower your risk of heart disease. Improve your blood pressure. Reach or maintain a healthy weight. What can affect my meal plan? Every person with diabetes is different, and each person has different needs for a meal plan. Your health care provider may recommend that you work with a dietitian to make a meal plan that is best for you. Your meal plan may vary depending on factors such as: The calories you need. The medicines you take. Your weight. Your blood glucose, blood pressure, and cholesterol levels. Your activity level. Other health conditions you have, such as heart or kidney disease. How do carbohydrates affect me? Carbohydrates, also called carbs, affect your blood glucose level more than any other type of food. Eating carbs raises the amount of glucose in your blood. It is important to know how many carbs you can safely have in each meal. This is different for every person. Your dietitian can help you calculate how many carbs you should have at each meal and for each snack. How does alcohol affect me? Alcohol can cause a decrease in blood glucose (hypoglycemia), especially if you use insulin or take certain diabetes medicines by mouth. Hypoglycemia can be a life-threatening condition. Symptoms of hypoglycemia, such as sleepiness, dizziness, and confusion, are similar to symptoms of having too much alcohol. Do not drink alcohol if: Your health care provider tells you not to drink. You are pregnant, may be pregnant, or are planning to become pregnant. If you drink alcohol: Limit  how much you have to: 0-1 drink a day for women. 0-2 drinks a day for men. Know how much alcohol is in your drink. In the [place] one drink equals one 12 oz bottle of beer (355 mL), one 5 oz glass of wine (148 mL), or one 1 oz glass of hard liquor (44 mL). Keep yourself hydrated with water, diet soda, or unsweetened iced tea. Keep in mind that regular soda, juice, and other mixers may contain a lot of sugar and must be counted as carbs. What are tips for following this plan? Reading food labels Start by checking the serving size on the Nutrition Facts label of packaged foods and drinks. The number of calories and the amount of carbs, fats, and other nutrients listed on the label are based on one serving of the item. Many items contain more than one serving per package. Check the total grams (g) of carbs in one serving. Check the number of grams of saturated fats and trans fats in one serving. Choose foods that have a low amount or none of these fats. Check the number of milligrams (mg) of salt (sodium) in one serving. Most people should limit total sodium intake to less than 2,300 mg per day. Always check the nutrition information of foods labeled as "low-fat" or "nonfat." These foods may be higher in added sugar or refined carbs and should be avoided. Talk to your dietitian to identify your daily goals for nutrients listed on the label. Shopping Avoid buying canned, pre-made, or processed foods. These foods tend to be high in fat, sodium, and  added sugar. Shop around the outside edge of the grocery store. This is where you will most often find fresh fruits and vegetables, bulk grains, fresh meats, and fresh dairy products. Cooking Use low-heat cooking methods, such as baking, instead of high-heat cooking methods, such as deep frying. Cook using healthy oils, such as olive, canola, or sunflower oil. Avoid cooking with butter, cream, or high-fat meats. Meal planning Eat meals and snacks regularly,  preferably at the same times every day. Avoid going long periods of time without eating. Eat foods that are high in fiber, such as fresh fruits, vegetables, beans, and whole grains. Eat 4-6 oz (112-168 g) of lean protein each day, such as lean meat, chicken, fish, eggs, or tofu. One ounce (oz) (28 g) of lean protein is equal to: 1 oz (28 g) of meat, chicken, or fish. 1 egg.  cup (62 g) of tofu. Eat some foods each day that contain healthy fats, such as avocado, nuts, seeds, and fish. What foods should I eat? Fruits Berries. Apples. Oranges. Peaches. Apricots. Plums. Grapes. Mangoes. Papayas. Pomegranates. Kiwi. Cherries. Vegetables Leafy greens, including lettuce, spinach, kale, chard, collard greens, mustard greens, and cabbage. Beets. Cauliflower. Broccoli. Carrots. Green beans. Tomatoes. Peppers. Onions. Cucumbers. Brussels sprouts. Grains Whole grains, such as whole-wheat or whole-grain bread, crackers, tortillas, cereal, and pasta. Unsweetened oatmeal. Quinoa. Brown or wild rice. Meats and other proteins Seafood. Poultry without skin. Lean cuts of poultry and beef. Tofu. Nuts. Seeds. Dairy Low-fat or fat-free dairy products such as milk, yogurt, and cheese. The items listed above may not be a complete list of foods and beverages you can eat and drink. Contact a dietitian for more information. What foods should I avoid? Fruits Fruits canned with syrup. Vegetables Canned vegetables. Frozen vegetables with butter or cream sauce. Grains Refined white flour and flour products such as bread, pasta, snack foods, and cereals. Avoid all processed foods. Meats and other proteins Fatty cuts of meat. Poultry with skin. Breaded or fried meats. Processed meat. Avoid saturated fats. Dairy Full-fat yogurt, cheese, or milk. Beverages Sweetened drinks, such as soda or iced tea. The items listed above may not be a complete list of foods and beverages you should avoid. Contact a dietitian for more  information. Questions to ask a health care provider Do I need to meet with a certified diabetes care and education specialist? Do I need to meet with a dietitian? What number can I call if I have questions? When are the best times to check my blood glucose? Where to find more information: American Diabetes Association: diabetes.org Academy of Nutrition and Dietetics: eatright.[employer] of Diabetes and Digestive and Kidney Diseases: [employer].is Association of Diabetes Care & Education Specialists: diabeteseducator.org Summary It is important to have healthy eating habits because your blood sugar (glucose) levels are greatly affected by what you eat and drink. It is important to use alcohol carefully. A healthy meal plan will help you manage your blood glucose and lower your risk of heart disease. Your health care provider may recommend that you work with a dietitian to make a meal plan that is best for you. This information is not intended to replace advice given to you by your health care provider. Make sure you discuss any questions you have with your health care provider. Document Revised: 07/06/2020 Document Reviewed: 07/06/2020 Elsevier Patient Education  [place].

## 2022-01-23 NOTE — Assessment & Plan Note (Signed)
Chronic, ongoing with recent A1c 8.3%, is followed by endocrinology, but suspect he may not return.  At this time recommend continue current medication regimen -- Lantus 20 units every night and Trulicity + Glimepiride.  May increase Trulicity next visit to 4.5 MG weekly.  Would benefit from [facility], [place], will place referral to CCM team for assist.  He reports he can not poke fingers.  Recommend focus on diabetic diet at home.  Return in 4 weeks.

## 2022-01-23 NOTE — Assessment & Plan Note (Signed)
Chronic, ongoing.  Continue current medication regimen and adjust as needed.  He reports he is a "hard stick" and gets labs at dialysis which he will get sent to provider and bring to visits.  Recommend he have lipid panel added onto these.

## 2022-01-23 NOTE — Assessment & Plan Note (Signed)
Acute for weeks since hospitalization.  At this time will send in renal dosed Tizanidine.  Recommend [place] of Tylenol as needed + heat. Referral to ortho placed.

## 2022-01-23 NOTE — Assessment & Plan Note (Signed)
BMI 44.67 with T2DM, HTN/HLD.  Recommended eating smaller high protein, low fat meals more frequently and exercising 30 mins a day 5 times a week with a goal of 10-15lb weight loss in the next 3 months. Patient voiced their understanding and motivation to adhere to these recommendations.

## 2022-01-23 NOTE — Assessment & Plan Note (Signed)
Chronic, ongoing with recent A1c 8.3%, is followed by endocrinology, but suspect he may not return.  At this time recommend continue current medication regimen -- Lantus 20 units every night and Trulicity + Glimepiride.  May increase Trulicity next visit to 4.5 MG weekly.  Would benefit from [facility], will place referral to CCM team for assist.  He reports he can not poke fingers.  Recommend focus on diabetic diet at home.  Continue Gabapentin, may need to renal dose further. Return in 4 weeks.

## 2022-01-23 NOTE — Assessment & Plan Note (Signed)
Chronic and ongoing, followed by cardiology and HF clinic.  Recent notes reviewed and will continue this collaboration + medications as ordered by them. 

## 2022-01-23 NOTE — Assessment & Plan Note (Signed)
Chronic, ongoing, has tried multiple medications.  Would avoid antipsychotic family (Seroquel) as could affect diabetes and weight.  Has tried Trazodone and Melatonin.  Uses Trilogy every night.  Discussed at length medications with him.  Would avoid high dose of any sleep aide due to OSA and overall health.  At this time will trial lowest dose of Belsomra 5 MG -- monitor closely and stop if increased fatigue.  PDMP reviewed.  Return in 4 weeks for follow-up.

## 2022-01-23 NOTE — Assessment & Plan Note (Signed)
Chronic, ongoing.  Continue current collaboration with nephrology.  He will attempt to obtain labs from dialysis for provider to review.

## 2022-01-23 NOTE — Assessment & Plan Note (Signed)
Chronic, ongoing with diagnosis of COPD in records.  Was followed by pulmonary and past, would like to return.  Referral placed to pulmonary for further guidance in this more complex case.  At this time continue Anoro and Albuterol.

## 2022-01-23 NOTE — Assessment & Plan Note (Signed)
Chronic and ongoing, followed by cardiology and HF clinic.  Recent notes reviewed and will continue this collaboration + medications as ordered by them.  He denies any palpitations or CP today. 

## 2022-01-23 NOTE — Chronic Care Management (AMB) (Signed)
°  Chronic Care Management   Outreach Note  01/23/2022 Name: [name] MRN: [id number] DOB: 20-May-1966  [name] is a 56 y.o. year old male who is a primary care patient of Cannady, Barbaraann Faster, NP. I reached out to [name] by phone today in response to a referral sent by [name]'s primary care provider.  An unsuccessful telephone outreach was attempted today. The patient was referred to the case management team for assistance with care management and care coordination.   Follow Up Plan: A HIPAA compliant phone message was left for the patient providing contact information and requesting a return call.  The care management team will reach out to the patient again over the next 7 days.  If patient returns call to provider office, please advise to call [facility] [place], [place], [place], [place] [address] Direct Dial: [phone] [email or web address] Website: [email or web address]

## 2022-01-23 NOTE — Assessment & Plan Note (Signed)
Chronic, ongoing.  At this time continue Allopurinol -- will renal dose this next visit and decrease to 50 MG.  Obtains labs at dialysis, will attempt to get these.

## 2022-01-23 NOTE — Assessment & Plan Note (Signed)
Chronic, ongoing.  Uses Trilogy at home, continue this and referral placed to return to pulmonary.  Would benefit from modest weight loss.

## 2022-01-23 NOTE — Assessment & Plan Note (Signed)
Chronic, ongoing, followed by ophthalmology.  Continue this collaboration.

## 2022-01-23 NOTE — Assessment & Plan Note (Signed)
Chronic and ongoing, followed by cardiology and HF clinic.  Euvolemic today.  Recent notes reviewed and will continue this collaboration + medications as ordered by them.  Recommend: - Reminded to call for an overnight weight gain of >2 pounds or a weekly weight gain of >5 pounds - not adding salt to food and read food labels. Reviewed the importance of keeping daily sodium intake to 2000mg  daily. - Avoid Ibuprofen products

## 2022-01-23 NOTE — Addendum Note (Signed)
Addended by: Marnee Guarneri T on: 01/23/2022 03:13 PM   Modules accepted: Orders

## 2022-01-23 NOTE — Progress Notes (Signed)
New Patient Office Visit  Subjective:  Patient ID: [name], male    DOB: Feb 27, 1966  Age: 56 y.o. MRN: [id number]  CC:  Chief Complaint  Patient presents with   Establish Care    Patient states he was recently hospitalized for an infection. Patient states since he was discharged from the hospital, he said has been experiencing some hip pain in his left hip. Patient states it is the same side where he was experiencing pain when he went to the hospital for the infection. Patient states the pain is constant and says the Tylenol does not help. Patient gets worse the more the patient is moving.    Referral    Patient is requesting a referral for a Gastroenterology doctor. Patient states he would like to discuss different options with provider for a colonoscopy, as patient states he is not sure if his heart can hold up for a colonoscopy.    Diabetes    Patient most recent Diabetic Eye Exam was requested at today's visit.    HPI [name] presents for new patient visit to establish care.  Introduced to Designer, jewellery role and practice setting.  All questions answered.  Discussed provider/patient relationship and expectations.  Recently discharged from hospital for pyelonephritis on 01/08/22.  Follows with urology and saw 01/18/22.  DIABETES Last saw Dr. Honor Junes with endo 12/13/20, but he reports not meshing with him -- reports nothing was happening.  Tried to get FreeStyle, but could not get paperwork.  Not checking sugars at this as can not poke fingers.  Last A1c on 01/09/22 == 8.3% -- he reports he normally sits high.  He takes [place], Trulicity 3 MG, and Glimepiride 4 MG.  Use to take Lantus, Metformin.  Has not taken SGLT2.   Hypoglycemic episodes:no Polydipsia/polyuria: no Visual disturbance: no Chest pain: no Paresthesias: no Glucose Monitoring: no  Accucheck frequency: Not Checking  Fasting glucose:  Post prandial:  Evening:  Before meals: Taking Insulin?:  yes  Long acting insulin: 20 units every night  Short acting insulin: Blood Pressure Monitoring: not checking Retinal Examination: Up to Date -- [place] Exam: Up to Date -- [place] Pneumovax: Up to Date Influenza: Up to Date Aspirin: yes  HYPERTENSION / HYPERLIPIDEMIA/HF Follows with Dr. Quentin Ore in cardiology -- had coronary stent placement 08/28/21.  Has underlying ventricular fibrillation, CAD, and HF. Continues ASA, Atorvastatin, Plavix, Imdur, Metoprolol, Entresto, Torsemide.  Has OSA severe and wears Triology.    Was followed by pulmonary in past and would like to return -- has been on oxygen for 3-4 years. Has underlying COPD on chart with inhalers. Satisfied with current treatment? yes Duration of hypertension: chronic BP monitoring frequency: not checking BP range:  BP medication side effects: no Past BP meds:  Duration of hyperlipidemia: chronic Cholesterol medication side effects: no Cholesterol supplements: none Past cholesterol medications:  Medication compliance: good compliance Aspirin: yes Recent stressors: no Recurrent headaches: no Visual changes: no Palpitations: no Dyspnea: no Chest pain: no Lower extremity edema: no Dizzy/lightheaded: no  VENTRICULAR FIBRILLATION Recent cardiology visit they discussed performing MRI on heart to see EF and determine need for ICD placement. Fibrillation status: stable Satisfied with current treatment: yes  Medication side effects:  no Medication compliance: good compliance Etiology of atrial fibrillation:  Palpitations:  no Chest pain:  no Dyspnea on exertion: yes Orthopnea:  no Syncope:  no Edema:  no -- wears compression hose religiously Ventricular rate control: B-blocker Anti-coagulation:  aspirin   CHRONIC KIDNEY DISEASE Has been on dialysis for one year - goes Monday, Wednesday, Friday.  They do labs every week.  Dr. Candiss Norse follows him.  Last saw vascular 11/30/21 -- Dr. Delana Meyer.    CKD status: stable Medications renally dose: yes Previous renal evaluation: yes Pneumovax:  Up to Date Influenza Vaccine:  Up to Date  Estimated Creatinine Clearance: 8.7 mL/min (A) (by C-G formula based on SCr of 11.31 mg/dL (H)).  HIP PAIN To left hip since he left hospital, no trauma in hospital.   Duration: weeks Involved hip: right  Mechanism of injury: unknown Location: lateral Onset: sudden  Severity: 8/10  Quality: sharp, dull, and throbbing Frequency: constant Radiation: no Aggravating factors: weight bearing, walking, bending, and movement   Alleviating factors: nothing  Status: stable Treatments attempted: rest, heat, and APAP   Relief with NSAIDs?: No NSAIDs Taken Weakness with weight bearing: no Weakness with walking: no Paresthesias / decreased sensation: no Swelling: no Redness:no Fevers: no  INSOMNIA Ongoing issue for 2 years, does have OSA.  Has tried Trazodone, Melatonin, Tramadol, muscle relaxer.   Duration: chronic Satisfied with sleep quality: yes Difficulty falling asleep: yes Difficulty staying asleep: yes Waking a few hours after sleep onset: yes Early morning awakenings: yes Daytime hypersomnolence: yes Wakes feeling refreshed: no Good sleep hygiene: yes Apnea: yes Snoring: no Depressed/anxious mood: no Recent stress: no Restless legs/nocturnal leg cramps: no Chronic pain/arthritis: no History of sleep study: yes Treatments attempted: melatonin and as above   Depression screen [facility] 2/9 01/23/2022 12/29/2019 09/08/2019 08/11/2019 08/11/2019  Decreased Interest 2 0 0 0 0  Down, Depressed, Hopeless 2 0 0 0 0  PHQ - 2 Score 4 0 0 0 0  Altered sleeping 3 - - - -  Tired, decreased energy 2 - - - -  Change in appetite 3 - - - -  Feeling bad or failure about yourself  1 - - - -  Trouble concentrating 0 - - - -  Moving slowly or fidgety/restless 0 - - - -  Suicidal thoughts 0 - - - -  PHQ-9 Score 13 - - - -  Difficult doing work/chores Somewhat  difficult - - - -  Some recent data might be hidden    GAD 7 : Generalized Anxiety Score 01/23/2022  Nervous, Anxious, on Edge 2  Control/stop worrying 0  Worry too much - different things 0  Trouble relaxing 3  Restless 2  Easily annoyed or irritable 2  Afraid - awful might happen 1  Total GAD 7 Score 10  Anxiety Difficulty Somewhat difficult    Past Medical History:  Diagnosis Date   Anemia of chronic disease    Bell's palsy    Cataract    Chronic combined systolic (congestive) and diastolic (congestive) heart failure (HCC)    Chronic respiratory failure with hypoxia (HCC)    3 lpm continuous   COPD (chronic obstructive pulmonary disease) (HCC)    Coronary artery disease    Diabetes mellitus without complication (HCC)    Dyspnea    Wheezing   ESRD on hemodialysis (HCC)    GERD (gastroesophageal reflux disease)    Gout    HOH (hard of hearing)    mild   Hypercholesterolemia    Hypertension    NSVT (nonsustained ventricular tachycardia)    Obstructive sleep apnea    CPAP, O2 use continuosly at 3LPM   Orthopnea    Pneumonia    In Past X2 most recent  1 yr ago   Psoriasis    Pulmonary HTN ([place])    Ventricular fibrillation [facility])     Past Surgical History:  Procedure Laterality Date   A/V FISTULAGRAM Right 05/02/2021   Procedure: A/V FISTULAGRAM;  Surgeon: Katha Cabal, MD;  Location: [place] [facility] LAB;  Service: Cardiovascular;  Laterality: Right;   AV FISTULA PLACEMENT Right 11/20/2019   Procedure: ARTERIOVENOUS (AV) FISTULA CREATION ( BRACHIAL CEPHALIC );  Surgeon: Katha Cabal, MD;  Location: [facility] ORS;  Service: Vascular;  Laterality: Right;   CATARACT EXTRACTION W/PHACO Left 12/17/2019   Procedure: CATARACT EXTRACTION PHACO AND INTRAOCULAR LENS PLACEMENT ([place]) LEFT ISTENT INJ DIABETIC;  Surgeon: Eulogio Bear, MD;  Location: [facility] OR[facility];  Service: Ophthalmology;  Laterality: Left;  [place] 00:33.2 CDE 2.96 Fluid Pack Lot # [id number]   CATARACT EXTRACTION  W/PHACO Right 02/24/2020   Procedure: CATARACT EXTRACTION PHACO AND INTRAOCULAR LENS PLACEMENT (IOC) RIGHT DIABETIC ISTENT INJ;  Surgeon: Eulogio Bear, MD;  Location: [facility] OR[facility];  Service: Ophthalmology;  Laterality: Right;  [place] 00:37.3 CDE 2.88 Fluid Pack Lot # [id number]   CORONARY ANGIOPLASTY WITH STENT PLACEMENT     stent placement   CORONARY STENT INTERVENTION N/A 08/28/2021   Procedure: CORONARY STENT INTERVENTION;  Surgeon: Nelva Bush, MD;  Location: [place] CV LAB;  Service: Cardiovascular;  Laterality: N/A;   DIALYSIS/PERMA CATHETER INSERTION N/A 06/22/2019   Procedure: DIALYSIS/PERMA CATHETER INSERTION;  Surgeon: Algernon Huxley, MD;  Location: [place] [facility];  Service: Cardiovascular;  Laterality: N/A;   DIALYSIS/PERMA CATHETER REMOVAL N/A 05/31/2020   Procedure: DIALYSIS/PERMA CATHETER REMOVAL;  Surgeon: Katha Cabal, MD;  Location: [place] [facility];  Service: Cardiovascular;  Laterality: N/A;   INSERTION OF AHMED VALVE Left 12/17/2019   Procedure: INSERTION OF iSTENT;  Surgeon: Eulogio Bear, MD;  Location: [facility] OR[facility];  Service: Ophthalmology;  Laterality: Left;   INTRAVASCULAR IMAGING/OCT N/A 08/28/2021   Procedure: INTRAVASCULAR IMAGING/OCT;  Surgeon: Nelva Bush, MD;  Location: [place] [facility];  Service: Cardiovascular;  Laterality: N/A;   LEFT HEART CATH AND CORONARY ANGIOGRAPHY N/A 08/08/2021   Procedure: LEFT HEART CATH AND CORONARY ANGIOGRAPHY;  Surgeon: Nelva Bush, MD;  Location: [place] [facility];  Service: Cardiovascular;  Laterality: N/A;    Family History  Problem Relation Age of Onset   Heart failure Mother    Kidney failure Brother     Social History   Socioeconomic History   Marital status: Single    Spouse name: Not on file   Number of children: Not on file   Years of education: Not on file   Highest education level: Not on file  Occupational History    Comment: disabled  Tobacco Use   Smoking status: Never    Smokeless tobacco: Never  Vaping Use   Vaping Use: Never used  Substance and Sexual Activity   Alcohol use: Yes    Alcohol/week: 1.0 standard drink    Types: 1 Cans of beer per week    Comment: rare occassion   Drug use: Not Currently    Types: Marijuana    Comment: in high school    Sexual activity: Not on file  Other Topics Concern   Not on file  Social History Narrative   Patient uses continuous oxygen at 3L/min   Dialysis, AV fistula in L arm   Lives at home by himself ; friend gives pt. Transportation    Social Determinants of Health   Financial Resource Strain:  Not on file  Food Insecurity: Not on file  Transportation Needs: Not on file  Physical Activity: Not on file  Stress: Not on file  Social Connections: Not on file  Intimate Partner Violence: Not on file    ROS Review of Systems  Constitutional:  Negative for activity change, diaphoresis, fatigue and fever.  Respiratory:  Negative for cough, chest tightness, shortness of breath and wheezing.   Cardiovascular:  Negative for chest pain, palpitations and leg swelling.  Gastrointestinal: Negative.   Endocrine: Negative for cold intolerance, heat intolerance, polydipsia, polyphagia and polyuria.  Musculoskeletal:  Positive for arthralgias.  Neurological: Negative.   Psychiatric/Behavioral:  Positive for sleep disturbance. Negative for decreased concentration, self-injury and suicidal ideas. The patient is nervous/anxious.    Objective:   Today's Vitals: BP 112/63    Pulse 81    Temp 98.1 F (36.7 C) (Oral)    Ht 5' 3.5" (1.613 m)    Wt 256 lb 3.2 oz (116.2 kg)    SpO2 92%    BMI 44.67 kg/m   Physical Exam Vitals and nursing note reviewed.  Constitutional:      General: He is awake. He is not in acute distress.    Appearance: He is well-developed and well-groomed. He is morbidly obese. He is not ill-appearing or toxic-appearing.  HENT:     Head: Normocephalic and atraumatic.     Right Ear: Hearing normal.  No drainage.     Left Ear: Hearing normal. No drainage.  Eyes:     General: Lids are normal.        Right eye: No discharge.        Left eye: No discharge.     Conjunctiva/sclera: Conjunctivae normal.     Pupils: Pupils are equal, round, and reactive to light.  Neck:     Thyroid: No thyromegaly.     Vascular: No carotid bruit.  Cardiovascular:     Rate and Rhythm: Normal rate and regular rhythm.     Heart sounds: Normal heart sounds, S1 normal and S2 normal. No murmur heard.   No gallop.     Arteriovenous access: Left arteriovenous access is present. Pulmonary:     Effort: Pulmonary effort is normal. No accessory muscle usage or respiratory distress.     Breath sounds: Normal breath sounds.     Comments: O2 by nasal cannula in place, 3L. Abdominal:     General: Bowel sounds are normal. There is no distension.     Palpations: Abdomen is soft.     Tenderness: There is no abdominal tenderness.  Musculoskeletal:        General: Normal range of motion.     Cervical back: Normal range of motion and neck supple.     Right hip: Normal.     Left hip: Tenderness (lateral aspect) present. No deformity, bony tenderness or crepitus. Normal range of motion. Normal strength.     Right lower leg: No edema.     Left lower leg: No edema.  Lymphadenopathy:     Cervical: No cervical adenopathy.  Skin:    General: Skin is warm and dry.     Capillary Refill: Capillary refill takes less than 2 seconds.     Findings: No rash.  Neurological:     Mental Status: He is alert and oriented to person, place, and time.     Deep Tendon Reflexes: Reflexes are normal and symmetric.  Psychiatric:        Attention and Perception: Attention normal.  Mood and Affect: Mood normal.        Speech: Speech normal.        Behavior: Behavior normal. Behavior is cooperative.        Thought Content: Thought content normal.    Assessment & Plan:   Problem List Items Addressed This Visit        Cardiovascular and Mediastinum   CAD (coronary artery disease)    Chronic and ongoing, followed by cardiology and HF clinic.  Recent notes reviewed and will continue this collaboration + medications as ordered by them.      Cardiomyopathy ([place])    Chronic and ongoing, followed by cardiology and HF clinic.  Recent notes reviewed and will continue this collaboration + medications as ordered by them.      Chronic combined systolic (congestive) and diastolic (congestive) heart failure (HCC)    Chronic and ongoing, followed by cardiology and HF clinic.  Euvolemic today.  Recent notes reviewed and will continue this collaboration + medications as ordered by them.  Recommend: - Reminded to call for an overnight weight gain of >2 pounds or a weekly weight gain of >5 pounds - not adding salt to food and read food labels. Reviewed the importance of keeping daily sodium intake to 2000mg  daily. - Avoid Ibuprofen products      Relevant Orders   Ambulatory referral to Pulmonology   Ventricular fibrillation ([place])    Chronic and ongoing, followed by cardiology and HF clinic.  Recent notes reviewed and will continue this collaboration + medications as ordered by them.  He denies any palpitations or CP today.        Respiratory   Obstructive sleep apnea (Chronic)    Chronic, ongoing.  Uses Trilogy at home, continue this and referral placed to return to pulmonary.  Would benefit from modest weight loss.      Chronic respiratory failure with hypoxia (HCC)    Chronic, ongoing with diagnosis of COPD in records.  Was followed by pulmonary and past, would like to return.  Referral placed to pulmonary for further guidance in this more complex case.  At this time continue Anoro and Albuterol.      Relevant Orders   Ambulatory referral to Pulmonology   COPD (chronic obstructive pulmonary disease) ([place])    Chronic, ongoing with diagnosis of COPD in records.  Was followed by pulmonary and past, would like to  return.  Referral placed to pulmonary for further guidance in this more complex case.  At this time continue Anoro and Albuterol.      Relevant Orders   Ambulatory referral to Pulmonology     Endocrine   Background diabetic retinopathy associated with type 2 diabetes mellitus ([place])    Chronic, ongoing, followed by ophthalmology.  Continue this collaboration.      DM type 2 with diabetic peripheral neuropathy (HCC)    Chronic, ongoing with recent A1c 8.3%, is followed by endocrinology, but suspect he may not return.  At this time recommend continue current medication regimen -- Lantus 20 units every night and Trulicity + Glimepiride.  May increase Trulicity next visit to 4.5 MG weekly.  Would benefit from [facility], will place referral to CCM team for assist.  He reports he can not poke fingers.  Recommend focus on diabetic diet at home.  Continue Gabapentin, may need to renal dose further. Return in 4 weeks.      Relevant Medications   tiZANidine (ZANAFLEX) 2 MG tablet   Suvorexant (BELSOMRA) 5 MG  TABS   Hyperlipidemia associated with type 2 diabetes mellitus (HCC)    Chronic, ongoing.  Continue current medication regimen and adjust as needed.  He reports he is a "hard stick" and gets labs at dialysis which he will get sent to provider and bring to visits.  Recommend he have lipid panel added onto these.      Type 2 diabetes mellitus with hyperglycemia, with long-term current use of insulin (HCC) - Primary    Chronic, ongoing with recent A1c 8.3%, is followed by endocrinology, but suspect he may not return.  At this time recommend continue current medication regimen -- Lantus 20 units every night and Trulicity + Glimepiride.  May increase Trulicity next visit to 4.5 MG weekly.  Would benefit from [facility], will place referral to CCM team for assist.  He reports he can not poke fingers.  Recommend focus on diabetic diet at home.  Return in 4 weeks.        Musculoskeletal and Integument    Chronic gout of multiple sites    Chronic, ongoing.  At this time continue Allopurinol -- will renal dose this next visit and decrease to 50 MG.  Obtains labs at dialysis, will attempt to get these.      Relevant Medications   tiZANidine (ZANAFLEX) 2 MG tablet     Genitourinary   ESRD (end stage renal disease) on dialysis (HCC)    Chronic, ongoing.  Continue current collaboration with nephrology.  He will attempt to obtain labs from dialysis for provider to review.        Other   Insomnia    Chronic, ongoing, has tried multiple medications.  Would avoid antipsychotic family (Seroquel) as could affect diabetes and weight.  Has tried Trazodone and Melatonin.  Uses Trilogy every night.  Discussed at length medications with him.  Would avoid high dose of any sleep aide due to OSA and overall health.  At this time will trial lowest dose of Belsomra 5 MG -- monitor closely and stop if increased fatigue.  PDMP reviewed.  Return in 4 weeks for follow-up.      Morbid obesity with BMI of 50.0-59.9, adult (HCC)    BMI 44.67 with T2DM, HTN/HLD.  Recommended eating smaller high protein, low fat meals more frequently and exercising 30 mins a day 5 times a week with a goal of 10-15lb weight loss in the next 3 months. Patient voiced their understanding and motivation to adhere to these recommendations.       Obesity hypoventilation syndrome (HCC)    Chronic, ongoing, will get back into pulmonary for further guidance on this more complex case.  Recommend he continue use of O2 and Trilogy.      Right hip pain    Acute for weeks since hospitalization.  At this time will send in renal dosed Tizanidine.  Recommend [place] of Tylenol as needed + heat. Referral to ortho placed.      Relevant Orders   Ambulatory referral to Orthopedics   Other Visit Diagnoses     Colon cancer screening       GI referral placed   Relevant Orders   Ambulatory referral to Gastroenterology   Encounter to establish care        Introduced to provider and clinic setting.       Outpatient Encounter Medications as of 01/23/2022  Medication Sig   albuterol (PROVENTIL HFA;VENTOLIN HFA) 108 (90 Base) MCG/ACT inhaler Inhale 2 puffs into the lungs every 4 (four) hours as  needed for wheezing or shortness of breath.   allopurinol (ZYLOPRIM) 100 MG tablet Take 100 mg by mouth daily.   ANORO ELLIPTA 62.5-25 MCG/INH AEPB INHALE 1 PUFF INTO THE LUNGS DAILY.   aspirin EC 81 MG tablet Take 81 mg by mouth in the morning. Swallow whole.   atorvastatin (LIPITOR) 40 MG tablet Take 1 tablet (40 mg total) by mouth at bedtime.   calcium acetate (PHOSLO) 667 MG capsule Take 667 mg by mouth 3 (three) times daily with meals.   Cholecalciferol (D3-1000) 25 MCG (1000 UT) tablet Take 2 tablets (2,000 Units total) by mouth daily.   clopidogrel (PLAVIX) 75 MG tablet Take 1 tablet (75 mg total) by mouth daily with breakfast.   docusate sodium (COLACE) 100 MG capsule Take 1 capsule (100 mg total) by mouth daily.   gabapentin (NEURONTIN) 300 MG capsule Take 300 mg by mouth at bedtime.   glimepiride (AMARYL) 4 MG tablet Take 4 mg by mouth daily with breakfast.   insulin degludec (TRESIBA FLEXTOUCH) 100 UNIT/ML FlexTouch Pen Inject 20 Units into the skin at bedtime.   isosorbide mononitrate (IMDUR) 30 MG 24 hr tablet Take 1 tablet (30 mg total) by mouth daily.   metoprolol succinate (TOPROL-XL) 25 MG 24 hr tablet Take 25 mg by mouth daily.   Multiple Vitamins-Minerals (TAB-A-VITE) TABS TAKE 1 TABLET BY MOUTH ONCE DAILY.   OXYGEN Inhale 3 L into the lungs daily.   pantoprazole (PROTONIX) 40 MG tablet Take 1 tablet (40 mg total) by mouth daily.   Risankizumab-rzaa (SKYRIZI) 150 MG/ML SOSY Inject 150 mg into the skin as directed. Every 12 weeks for maintenance.   sacubitril-valsartan (ENTRESTO) 49-51 MG Take 1 tablet by mouth 2 (two) times daily. STOP taking your Losartan and START taking Entresto.   silodosin (RAPAFLO) 4 MG CAPS capsule TAKE 1 CAPSULE  ONCE DAILY WITH BREAKFAST   Suvorexant (BELSOMRA) 5 MG TABS Take 5 mg by mouth at bedtime as needed.   tiZANidine (ZANAFLEX) 2 MG tablet Take 1 tablet (2 mg total) by mouth daily as needed for muscle spasms.   torsemide 40 MG TABS Take TWO tablets (80 mg) TWICE daily only on days you DO NOT have dialysis: Tuesday, Thursday, Saturday and Sunday   triamcinolone (KENALOG) 0.025 % cream Apply 1 application topically daily in the afternoon.   TRULICITY 3 FU/9.3AT SOPN Inject 3 mg into the skin every Thursday.   [DISCONTINUED] cyclobenzaprine (FLEXERIL) 5 MG tablet Take 1 tablet (5 mg total) by mouth 2 (two) times daily as needed for muscle spasms.   No facility-administered encounter medications on file as of 01/23/2022.    Follow-up: Return in about 4 weeks (around 02/20/2022) for [place].   Venita Lick, NP

## 2022-01-23 NOTE — Assessment & Plan Note (Signed)
Chronic, ongoing, will get back into pulmonary for further guidance on this more complex case.  Recommend he continue use of O2 and Trilogy.

## 2022-01-25 ENCOUNTER — Other Ambulatory Visit: Payer: Self-pay

## 2022-01-25 ENCOUNTER — Ambulatory Visit (INDEPENDENT_AMBULATORY_CARE_PROVIDER_SITE_OTHER): Payer: Medicare Other | Admitting: Dermatology

## 2022-01-25 DIAGNOSIS — I872 Venous insufficiency (chronic) (peripheral): Secondary | ICD-10-CM | POA: Diagnosis not present

## 2022-01-25 DIAGNOSIS — Z79899 Other long term (current) drug therapy: Secondary | ICD-10-CM

## 2022-01-25 DIAGNOSIS — L409 Psoriasis, unspecified: Secondary | ICD-10-CM | POA: Diagnosis not present

## 2022-01-25 MED ORDER — ZORYVE 0.3 % EX CREA: 1.0000 "application " | TOPICAL_CREAM | CUTANEOUS | 4 refills | Status: DC

## 2022-01-25 NOTE — Patient Instructions (Signed)

## 2022-01-25 NOTE — Progress Notes (Signed)
° °  Follow-Up Visit   Subjective  [name] is a 56 y.o. male who presents for the following: Psoriasis (Trunk, extremities, >3yr, Skyrizi 150mg  sq injections q 12 wks needs prescription pt is past due for injection ~37m, TMC 0.1% cr qod, pt recently in hospital for urinary tract infection). The patient has spots, moles and lesions to be evaluated, some may be new or changing and the patient has concerns that these could be cancer.  The following portions of the chart were reviewed this encounter and updated as appropriate:   Tobacco   Allergies   Meds   Problems   Med Hx   Surg Hx   Fam Hx      Review of Systems:  No other skin or systemic complaints except as noted in HPI or Assessment and Plan.  Objective  Well appearing patient in no apparent distress; mood and affect are within normal limits.  A focused examination was performed including back, abdomen, arms, legs. Relevant physical exam findings are noted in the Assessment and Plan.  trunk, extremities Some plaques low back minimally,  all over spotty macular hyperpigmentation from previous psoriasis           Right Lower Leg - Anterior Stasis changes lower legs   Assessment & Plan  Psoriasis trunk, extremities Chronic and persistent condition with duration or expected duration over one year. Condition is symptomatic/ bothersome to patient. Not currently at goal.  Psoriasis - severe on systemic biologic treatment injections.  Psoriasis is a chronic non-curable, but treatable genetic/hereditary disease that may have other systemic features affecting other organ systems such as joints (Psoriatic Arthritis).  It is linked with heart disease, inflammatory bowel disease, non-alcoholic fatty liver disease, and depression. Significant skin psoriasis and/or psoriatic arthritis may have significant symptoms and affects activities of daily activity and often benefits from systemic biologic injection treatments.  These  biologic treatments have some potential side effects including immunosuppression and require pre-treatment laboratory screening and periodic laboratory monitoring and periodic in person evaluation and monitoring by the attending dermatologist physician (long term medication management).   Labs from 01/14/22 viewed  Will order QuantiFeron TB Gold test Cont Skyrizi 150mg  sq injections q 12 wks pending TB test results  D/c TMC cr Start Zoryve cr qd, samples x 4 Lot TDCA-2-B, 03/2023  Roflumilast (ZORYVE) 0.3 % CREA - trunk, extremities Apply 1 application topically as directed. Qd to aa psoriasis on body until clear, then prn flares  Related Procedures QuantiFERON-TB Gold Plus  Related Medications Risankizumab-rzaa (SKYRIZI) 150 MG/ML SOSY Inject 150 mg into the skin as directed. Every 12 weeks for maintenance.  Stasis dermatitis of both legs Lower Legs - Anterior  Stasis in the legs causes chronic leg swelling, which may result in itchy or painful rashes, skin discoloration, skin texture changes, and sometimes ulceration.  Recommend daily graduated compression hose/stockings- easiest to put on first thing in morning, remove at bedtime.  Elevate legs as much as possible. Avoid salt/sodium rich foods.  Return in about 6 months (around 07/25/2022) for Psoriasis f/u.  I, Othelia Pulling, RMA, am acting as scribe for Sarina Ser, MD . Documentation: I have reviewed the above documentation for accuracy and completeness, and I agree with the above.  Sarina Ser, MD

## 2022-01-26 ENCOUNTER — Other Ambulatory Visit: Payer: Self-pay | Admitting: *Deleted

## 2022-01-26 DIAGNOSIS — I4901 Ventricular fibrillation: Secondary | ICD-10-CM

## 2022-01-26 MED ORDER — CLOPIDOGREL BISULFATE 75 MG PO TABS: 75.0000 mg | ORAL_TABLET | Freq: Every day | ORAL | 0 refills | Status: DC

## 2022-01-29 ENCOUNTER — Telehealth: Payer: Self-pay | Admitting: Nurse Practitioner

## 2022-01-29 ENCOUNTER — Telehealth: Payer: Self-pay

## 2022-01-29 LAB — HEMOGLOBIN A1C: Hemoglobin A1C: 8.3

## 2022-01-29 LAB — CBC AND DIFFERENTIAL: Hemoglobin: 10.7 — AB (ref 13.5–17.5)

## 2022-01-29 LAB — BASIC METABOLIC PANEL
Glucose: 155
Potassium: 4.9 (ref 3.4–5.3)

## 2022-01-29 LAB — COMPREHENSIVE METABOLIC PANEL: Calcium: 8.5 — AB (ref 8.7–10.7)

## 2022-01-29 NOTE — Telephone Encounter (Signed)
Patient called office to ask what the referral was for. I explained to him it was to schedule a colonoscopy. Patient verbalized he would like to speak with his PCP first before scheduling.

## 2022-01-29 NOTE — Telephone Encounter (Signed)
Copied from [place] [id number]. Topic: General - Other >> Jan 29, 2022 11:52 AM [name] A wrote: Reason for CRM: The patient would like to be seen by a Gastroenterologist in [facility]   The patient requires assistance with transportation   The patient received the information about their previous referral but will be unable to travel to [facility]   Please contact further when possible

## 2022-01-30 ENCOUNTER — Ambulatory Visit (INDEPENDENT_AMBULATORY_CARE_PROVIDER_SITE_OTHER): Payer: Medicare Other | Admitting: Cardiology

## 2022-01-30 ENCOUNTER — Telehealth: Payer: Self-pay

## 2022-01-30 ENCOUNTER — Other Ambulatory Visit
Admission: RE | Admit: 2022-01-30 | Discharge: 2022-01-30 | Disposition: A | Discharge status: Home / Self Care | Payer: Medicare Other | Source: Ambulatory Visit | Attending: Cardiology | Admitting: Cardiology

## 2022-01-30 ENCOUNTER — Other Ambulatory Visit: Payer: Self-pay

## 2022-01-30 ENCOUNTER — Encounter: Payer: Self-pay | Admitting: Dermatology

## 2022-01-30 ENCOUNTER — Encounter: Payer: Self-pay | Admitting: Cardiology

## 2022-01-30 VITALS — BP 120/80 | HR 82 | Ht 63.0 in | Wt 260.0 lb

## 2022-01-30 DIAGNOSIS — I10 Essential (primary) hypertension: Secondary | ICD-10-CM

## 2022-01-30 DIAGNOSIS — I4901 Ventricular fibrillation: Secondary | ICD-10-CM

## 2022-01-30 DIAGNOSIS — L409 Psoriasis, unspecified: Secondary | ICD-10-CM

## 2022-01-30 DIAGNOSIS — I255 Ischemic cardiomyopathy: Secondary | ICD-10-CM | POA: Insufficient documentation

## 2022-01-30 DIAGNOSIS — I2510 Atherosclerotic heart disease of native coronary artery without angina pectoris: Secondary | ICD-10-CM

## 2022-01-30 LAB — HEMOGLOBIN AND HEMATOCRIT, BLOOD
HCT: 34.5 % — ABNORMAL LOW (ref 39.0–52.0)
Hemoglobin: 10.6 g/dL — ABNORMAL LOW (ref 13.0–17.0)

## 2022-01-30 MED ORDER — ISOSORBIDE MONONITRATE ER 30 MG PO TB24: 15.0000 mg | ORAL_TABLET | Freq: Every day | ORAL | 3 refills | Status: DC

## 2022-01-30 MED ORDER — METOPROLOL SUCCINATE ER 50 MG PO TB24: 50.0000 mg | ORAL_TABLET | Freq: Every day | ORAL | 3 refills | Status: DC

## 2022-01-30 MED ORDER — SKYRIZI 150 MG/ML ~~LOC~~ SOSY: 150.0000 mg | PREFILLED_SYRINGE | SUBCUTANEOUS | 1 refills | Status: DC

## 2022-01-30 NOTE — Progress Notes (Signed)
Cardiology Office Note:    Date:  01/30/2022   ID:  [name], DOB [birth year], MRN [id number]  PCP:  Venita Lick, NP  Cardiologist:  Kate Sable, MD  Electrophysiologist:  Vickie Epley, MD    Chief Complaint  Patient presents with   Other    4-6 week follow u -- Meds reviewed verbally with patient.     History of Present Illness:    [name] is a 56 y.o. male with a hx of CAD(PCI to RCA 2019, PCI to LAD and Lcx 08/2021) VF arrest 04/2021 (deemed ischemic w/ subsequent pci), hypertension, diabetes, CKD on dialysis 3 times a week, OSA, COPD, who presents for follow-up.    Admitted to the hospital about 2 weeks ago due to flank pain, diagnosed with pyelonephritis and managed with antibiotics.  Torsemide was previously increased to 80 mg daily for additional volume control.  Takes Toprol-XL 25 mg daily and Entresto 49-51 mg twice daily for cardiomyopathy.  Denies chest pain.  Cardiac MRI was previously ordered, patient did not want to have test performed due to previous experience at [facility].  Feels okay.   Prior notes Echo 09/2021 EF 35 to 40%. Echo 04/2021 EF 50 to 55% LHC 07/2021 80% LAD, 90% Lcx , patent mid RCA stent s/p pci in 08/2021 with DES to LAD and Lcx. echo on 09/2019 showed mildly reduced EF 45 to 50%,  Lexiscan 08/2019 did not show any evidence for ischemia.  Not on Aldactone due to previous hyperkalemia.  Past Medical History:  Diagnosis Date   Anemia of chronic disease    Bell's palsy    Cataract    Chronic combined systolic (congestive) and diastolic (congestive) heart failure (HCC)    Chronic respiratory failure with hypoxia (HCC)    3 lpm continuous   COPD (chronic obstructive pulmonary disease) (HCC)    Coronary artery disease    Diabetes mellitus without complication (HCC)    Dyspnea    Wheezing   ESRD on hemodialysis (HCC)    GERD (gastroesophageal reflux disease)    Gout    HOH (hard of hearing)    mild    Hypercholesterolemia    Hypertension    NSVT (nonsustained ventricular tachycardia)    Obstructive sleep apnea    CPAP, O2 use continuosly at 3LPM   Orthopnea    Pneumonia    In Past X2 most recent 1 yr ago   Psoriasis    Pulmonary HTN ([place])    Ventricular fibrillation [facility])     Past Surgical History:  Procedure Laterality Date   A/V FISTULAGRAM Right 05/02/2021   Procedure: A/V FISTULAGRAM;  Surgeon: Katha Cabal, MD;  Location: [place] CV [facility];  Service: Cardiovascular;  Laterality: Right;   AV FISTULA PLACEMENT Right 11/20/2019   Procedure: ARTERIOVENOUS (AV) FISTULA CREATION ( BRACHIAL CEPHALIC );  Surgeon: Katha Cabal, MD;  Location: [facility] ORS;  Service: Vascular;  Laterality: Right;   CATARACT EXTRACTION W/PHACO Left 12/17/2019   Procedure: CATARACT EXTRACTION PHACO AND INTRAOCULAR LENS PLACEMENT ([place]) LEFT ISTENT INJ DIABETIC;  Surgeon: Eulogio Bear, MD;  Location: [facility] ORS;  Service: Ophthalmology;  Laterality: Left;  [place] 00:33.2 CDE 2.96 Fluid Pack Lot # [id number]   CATARACT EXTRACTION W/PHACO Right 02/24/2020   Procedure: CATARACT EXTRACTION PHACO AND INTRAOCULAR LENS PLACEMENT (IOC) RIGHT DIABETIC ISTENT INJ;  Surgeon: Eulogio Bear, MD;  Location: [facility] ORS;  Service: Ophthalmology;  Laterality: Right;  [place] 00:37.3 CDE 2.88 Fluid  Pack Lot # [id number]   CORONARY ANGIOPLASTY WITH STENT PLACEMENT     stent placement   CORONARY STENT INTERVENTION N/A 08/28/2021   Procedure: CORONARY STENT INTERVENTION;  Surgeon: Nelva Bush, MD;  Location: [place] CV LAB;  Service: Cardiovascular;  Laterality: N/A;   DIALYSIS/PERMA CATHETER INSERTION N/A 06/22/2019   Procedure: DIALYSIS/PERMA CATHETER INSERTION;  Surgeon: Algernon Huxley, MD;  Location: [place] CV LAB;  Service: Cardiovascular;  Laterality: N/A;   DIALYSIS/PERMA CATHETER REMOVAL N/A 05/31/2020   Procedure: DIALYSIS/PERMA CATHETER REMOVAL;  Surgeon: Katha Cabal, MD;  Location: [place]  CV LAB;  Service: Cardiovascular;  Laterality: N/A;   INSERTION OF AHMED VALVE Left 12/17/2019   Procedure: INSERTION OF iSTENT;  Surgeon: Eulogio Bear, MD;  Location: [facility] ORS;  Service: Ophthalmology;  Laterality: Left;   INTRAVASCULAR IMAGING/OCT N/A 08/28/2021   Procedure: INTRAVASCULAR IMAGING/OCT;  Surgeon: Nelva Bush, MD;  Location: [place] CV LAB;  Service: Cardiovascular;  Laterality: N/A;   LEFT HEART CATH AND CORONARY ANGIOGRAPHY N/A 08/08/2021   Procedure: LEFT HEART CATH AND CORONARY ANGIOGRAPHY;  Surgeon: Nelva Bush, MD;  Location: [place] CV LAB;  Service: Cardiovascular;  Laterality: N/A;    Current Medications: Current Meds  Medication Sig   albuterol (PROVENTIL HFA;VENTOLIN HFA) 108 (90 Base) MCG/ACT inhaler Inhale 2 puffs into the lungs every 4 (four) hours as needed for wheezing or shortness of breath.   allopurinol (ZYLOPRIM) 100 MG tablet Take 100 mg by mouth daily.   ANORO ELLIPTA 62.5-25 MCG/INH AEPB INHALE 1 PUFF INTO THE LUNGS DAILY.   aspirin EC 81 MG tablet Take 81 mg by mouth in the morning. Swallow whole.   atorvastatin (LIPITOR) 40 MG tablet Take 1 tablet (40 mg total) by mouth at bedtime.   calcium acetate (PHOSLO) 667 MG capsule Take 667 mg by mouth 3 (three) times daily with meals.   Cholecalciferol (D3-1000) 25 MCG (1000 UT) tablet Take 2 tablets (2,000 Units total) by mouth daily.   clopidogrel (PLAVIX) 75 MG tablet Take 1 tablet (75 mg total) by mouth daily with breakfast.   docusate sodium (COLACE) 100 MG capsule Take 1 capsule (100 mg total) by mouth daily.   gabapentin (NEURONTIN) 300 MG capsule Take 300 mg by mouth at bedtime.   glimepiride (AMARYL) 4 MG tablet Take 4 mg by mouth daily with breakfast.   insulin degludec (TRESIBA FLEXTOUCH) 100 UNIT/ML FlexTouch Pen Inject 20 Units into the skin at bedtime.   Multiple Vitamins-Minerals (TAB-A-VITE) TABS TAKE 1 TABLET BY MOUTH ONCE DAILY.   OXYGEN Inhale 3 L into the lungs  daily.   pantoprazole (PROTONIX) 40 MG tablet Take 1 tablet (40 mg total) by mouth daily.   Risankizumab-rzaa (SKYRIZI) 150 MG/ML SOSY Inject 150 mg into the skin as directed. Every 12 weeks for maintenance.   Roflumilast (ZORYVE) 0.3 % CREA Apply 1 application topically as directed. Qd to aa psoriasis on body until clear, then prn flares   sacubitril-valsartan (ENTRESTO) 49-51 MG Take 1 tablet by mouth 2 (two) times daily. STOP taking your Losartan and START taking Entresto.   silodosin (RAPAFLO) 4 MG CAPS capsule TAKE 1 CAPSULE ONCE DAILY WITH BREAKFAST   Suvorexant (BELSOMRA) 5 MG TABS Take 5 mg by mouth at bedtime as needed.   tiZANidine (ZANAFLEX) 2 MG tablet Take 1 tablet (2 mg total) by mouth daily as needed for muscle spasms.   torsemide 40 MG TABS Take TWO tablets (80 mg) TWICE daily only on days you DO  NOT have dialysis: Tuesday, Thursday, Saturday and Sunday   triamcinolone (KENALOG) 0.025 % cream Apply 1 application topically daily in the afternoon.   TRULICITY 3 DP/8.2UM SOPN Inject 3 mg into the skin every Thursday.   [DISCONTINUED] isosorbide mononitrate (IMDUR) 30 MG 24 hr tablet Take 1 tablet (30 mg total) by mouth daily.   [DISCONTINUED] metoprolol succinate (TOPROL-XL) 25 MG 24 hr tablet Take 25 mg by mouth daily.     Allergies:   Shellfish allergy, Betadine [povidone iodine], and Povidone-iodine   Social History   Socioeconomic History   Marital status: Single    Spouse name: Not on file   Number of children: Not on file   Years of education: Not on file   Highest education level: Not on file  Occupational History    Comment: disabled  Tobacco Use   Smoking status: Never   Smokeless tobacco: Never  Vaping Use   Vaping Use: Never used  Substance and Sexual Activity   Alcohol use: Yes    Alcohol/week: 1.0 standard drink    Types: 1 Cans of beer per week    Comment: rare occassion   Drug use: Not Currently    Types: Marijuana    Comment: in high school     Sexual activity: Not on file  Other Topics Concern   Not on file  Social History Narrative   Patient uses continuous oxygen at 3L/min   Dialysis, AV fistula in L arm   Lives at home by himself ; friend gives pt. Transportation    Social Determinants of Health   Financial Resource Strain: Not on file  Food Insecurity: Not on file  Transportation Needs: Not on file  Physical Activity: Not on file  Stress: Not on file  Social Connections: Not on file     Family History: The patient's family history includes Heart failure in his mother; Kidney failure in his brother.  ROS:   Please see the history of present illness.     All other systems reviewed and are negative.  EKGs/Labs/Other Studies Reviewed:    The following studies were reviewed today:   EKG:  EKG not ordered today.    Recent Labs: 05/08/2021: ALT 16 05/19/2021: Magnesium 1.9 01/08/2022: B Natriuretic Peptide 116.9; TSH 1.712 01/14/2022: BUN 52; Creatinine, Ser 11.31; Hemoglobin 10.1; Platelets 212; Potassium 5.4; Sodium 134  Recent Lipid Panel    Component Value Date/Time   CHOL 100 05/09/2021 0453   CHOL 116 04/21/2019 1334   TRIG 117 05/09/2021 0453   HDL 24 (L) 05/09/2021 0453   HDL 37 (L) 04/21/2019 1334   CHOLHDL 4.2 05/09/2021 0453   VLDL 23 05/09/2021 0453   LDLCALC 53 05/09/2021 0453   LDLCALC 63 04/21/2019 1334    Physical Exam:    VS:  BP 120/80 (BP Location: Left Arm, Patient Position: Sitting, Cuff Size: Large)    Pulse 82    Ht 5\' 3"  (1.6 m)    Wt 260 lb (117.9 kg)    SpO2 97%    BMI 46.06 kg/m     Wt Readings from Last 3 Encounters:  01/30/22 260 lb (117.9 kg)  01/23/22 256 lb 3.2 oz (116.2 kg)  01/18/22 264 lb (119.7 kg)     GEN:  Well nourished, well developed in no acute distress, nasal canula HEENT: Normal NECK: No JVD; No carotid bruits LYMPHATICS: No lymphadenopathy CARDIAC: RRR, no murmurs, rubs, gallops RESPIRATORY:  decreased breath sounds at bases. ABDOMEN: Soft, non-tender,  distended MUSCULOSKELETAL:  trace edema; No deformity  SKIN: Warm and dry NEUROLOGIC:  Alert and oriented x 3 PSYCHIATRIC:  Normal affect   ASSESSMENT:    1. Coronary artery disease involving native coronary artery of native heart without angina pectoris   2. Ventricular fibrillation ([place])   3. Primary hypertension   4. Ischemic cardiomyopathy     PLAN:     CAD (PCI to RCA 2019, PCI to LAD and LCx 08/2021).  Denies chest pain.  Continue aspirin, Plavix, Lipitor 40.  Reduce Imdur to 15 mg daily.  LDL at goal. hx ventricular fibrillation arrest.  Likely from ischemia, s/p PCI to LAD and left circumflex. Echo with EF 35 to 40%.  Obtain CMR.  Patient is a hard stick.  Consider defibrillator if EF less than 35%. Hypertension, BP controlled.  Continue Toprol-XL, Entresto.  Reduce Imdur to 30 mg daily. Ischemic cardiomyopathy, EF 35 to 40%, obtain CMR for more accurate assessment of EF.  Increase Toprol-XL to 50 mg daily.  Continue Entresto 49-51 mg twice daily. Continue volume control with hemodialysis.     Follow-up in 4-6 weeks   Medication Adjustments/Labs and Tests Ordered: Current medicines are reviewed at length with the patient today.  Concerns regarding medicines are outlined above.  Orders Placed This Encounter  Procedures   MR CARDIAC MORPHOLOGY W WO CONTRAST   Hemoglobin and hematocrit, blood       Meds ordered this encounter  Medications   isosorbide mononitrate (IMDUR) 30 MG 24 hr tablet    Sig: Take 0.5 tablets (15 mg total) by mouth daily.    Dispense:  15 tablet    Refill:  3    NO NEED TO REFILL AT THE THIS TIME. This is a dose decrease.   metoprolol succinate (TOPROL-XL) 50 MG 24 hr tablet    Sig: Take 1 tablet (50 mg total) by mouth daily.    Dispense:  30 tablet    Refill:  3      Patient Instructions  Medication Instructions:   Your physician has recommended you make the following change in your medication:    DECREASE your Isosorbide (Imdur) to  15 MG once a day. You will need to cut your tablet in half.  2.    INCREASE your Metoprolol Succinate (Toprol XL) to 50 MG once a day.   *If you need a refill on your cardiac medications before your next appointment, please call your pharmacy*   Lab Work:  Please go to the [facility] today for an H/H blood draw.   Testing/Procedures:  You are scheduled for Cardiac MRI on ___________. Please arrive at the [facility] main entrance of [facility] at _________ (30-45 minutes prior to test start time). ?  [facility]  [address]  [place], [place] [address]  [phone]  Proceed to the [facility] Radiology Department (First Floor).  ?  Magnetic resonance imaging (MRI) is a painless test that produces images of the inside of the body without using X-rays. During an MRI, strong magnets and radio waves work together in a Research officer, political party to form detailed images. MRI images may provide more details about a medical condition than X-rays, CT scans, and ultrasounds can provide.  You may be given earphones to listen for instructions.  You may eat a light breakfast and take medications as ordered with the exception of torsemide (fluid pill, other). If a contrast material will be used, an IV will be inserted into one of your  veins. Contrast material will be injected into your IV.  You will be asked to remove all metal, including: Watch, jewelry, and other metal objects including hearing aids, hair pieces and dentures. (Braces and fillings normally are not a problem.)  If contrast material was used:  It will leave your body through your urine within a day. You may be told to drink plenty of fluids to help flush the contrast material out of your system.  TEST WILL TAKE APPROXIMATELY 1 HOUR  PLEASE NOTIFY SCHEDULING AT LEAST 24 HOURS IN ADVANCE IF YOU ARE UNABLE TO KEEP YOUR APPOINTMENT.      Follow-Up: At [facility], you and your health needs are our priority.  As part  of our continuing mission to provide you with exceptional heart care, we have created designated Provider Care Teams.  These Care Teams include your primary Cardiologist (physician) and Advanced Practice Providers (APPs -  Physician Assistants and Nurse Practitioners) who all work together to provide you with the care you need, when you need it.  We recommend signing up for the patient portal called "MyChart".  Sign up information is provided on this After Visit Summary.  MyChart is used to connect with patients for Virtual Visits (Telemedicine).  Patients are able to view lab/test results, encounter notes, upcoming appointments, etc.  Non-urgent messages can be sent to your provider as well.   To learn more about what you can do with MyChart, go to [email or web address].    Your next appointment:   3 week(s)  The format for your next appointment:   In Person  Provider:    ONLY WITH Kate Sable, MD     Other Instructions      Signed, Kate Sable, MD  01/30/2022 10:15 AM    [place]

## 2022-01-30 NOTE — Chronic Care Management (AMB) (Signed)
Chronic Care Management   Note  01/30/2022 Name: EBB CARELOCK MRN: [id number] DOB: 04-30-1966  RAHIL PASSEY is a 56 y.o. year old male who is a primary care patient of Cannady, Barbaraann Faster, NP. I reached out to [name] by phone today in response to a referral sent by Mr. Aiman Noe Pargas's PCP.  Mr. Alberg was given information about Chronic Care Management services today including:  CCM service includes personalized support from designated clinical staff supervised by his physician, including individualized plan of care and coordination with other care providers 24/7 contact phone numbers for assistance for urgent and routine care needs. Service will only be billed when office clinical staff spend 20 minutes or more in a month to coordinate care. Only one practitioner may furnish and bill the service in a calendar month. The patient may stop CCM services at any time (effective at the end of the month) by phone call to the office staff. The patient is responsible for co-pay (up to 20% after annual deductible is met) if co-pay is required by the individual health plan.   Patient agreed to services and verbal consent obtained.   Follow up plan: Telephone appointment with care management team member scheduled for:02/12/2022  Noreene Larsson, [place], [place], [place] [address] Direct Dial: [phone] [email or web address]

## 2022-01-30 NOTE — Patient Instructions (Signed)
Medication Instructions:   Your physician has recommended you make the following change in your medication:    DECREASE your Isosorbide (Imdur) to 15 MG once a day. You will need to cut your tablet in half.  2.    INCREASE your Metoprolol Succinate (Toprol XL) to 50 MG once a day.   *If you need a refill on your cardiac medications before your next appointment, please call your pharmacy*   Lab Work:  Please go to the [facility] today for an H/H blood draw.   Testing/Procedures:  You are scheduled for Cardiac MRI on ___________. Please arrive at the [facility] main entrance of [facility] at _________ (30-45 minutes prior to test start time). ?  [facility]  [address]  [place], [place] [address]  [phone]  Proceed to the [facility] Radiology Department (First Floor).  ?  Magnetic resonance imaging (MRI) is a painless test that produces images of the inside of the body without using X-rays. During an MRI, strong magnets and radio waves work together in a Research officer, political party to form detailed images. MRI images may provide more details about a medical condition than X-rays, CT scans, and ultrasounds can provide.  You may be given earphones to listen for instructions.  You may eat a light breakfast and take medications as ordered with the exception of torsemide (fluid pill, other). If a contrast material will be used, an IV will be inserted into one of your veins. Contrast material will be injected into your IV.  You will be asked to remove all metal, including: Watch, jewelry, and other metal objects including hearing aids, hair pieces and dentures. (Braces and fillings normally are not a problem.)  If contrast material was used:  It will leave your body through your urine within a day. You may be told to drink plenty of fluids to help flush the contrast material out of your system.  TEST WILL TAKE APPROXIMATELY 1 HOUR  PLEASE NOTIFY SCHEDULING AT LEAST 24  HOURS IN ADVANCE IF YOU ARE UNABLE TO KEEP YOUR APPOINTMENT.      Follow-Up: At [facility], you and your health needs are our priority.  As part of our continuing mission to provide you with exceptional heart care, we have created designated Provider Care Teams.  These Care Teams include your primary Cardiologist (physician) and Advanced Practice Providers (APPs -  Physician Assistants and Nurse Practitioners) who all work together to provide you with the care you need, when you need it.  We recommend signing up for the patient portal called "MyChart".  Sign up information is provided on this After Visit Summary.  MyChart is used to connect with patients for Virtual Visits (Telemedicine).  Patients are able to view lab/test results, encounter notes, upcoming appointments, etc.  Non-urgent messages can be sent to your provider as well.   To learn more about what you can do with MyChart, go to [email or web address].    Your next appointment:   3 week(s)  The format for your next appointment:   In Person  Provider:    ONLY WITH Kate Sable, MD     Other Instructions

## 2022-01-30 NOTE — Telephone Encounter (Signed)
Pts TB test results from [facility] are in pts media tab./sh

## 2022-01-30 NOTE — Telephone Encounter (Signed)
-----   Message from Ralene Bathe, MD sent at 01/30/2022 11:30 AM EST ----- Please put in a patient note - it was read 01/29/2022.  Then I'll respond and say I reviewed and give orders for sending in his med. thanks ----- Message ----- From: Larence Penning, CMA Sent: 01/30/2022   8:19 AM EST To: Ralene Bathe, MD  Pt's TB test results are in media/sh ----- Message ----- From: Larence Penning, CMA Sent: 01/30/2022   8:00 AM EST To: Larence Penning, CMA  Check on patients TB test, [facility] on [address]

## 2022-01-30 NOTE — Telephone Encounter (Signed)
Left pt message that we received his TB results and I was sending in his 2 refills of Skyrizi to [facility].  Advised pt to call the office if he had any questions./sh

## 2022-01-31 ENCOUNTER — Telehealth: Payer: Self-pay | Admitting: Cardiology

## 2022-01-31 ENCOUNTER — Other Ambulatory Visit: Payer: Self-pay

## 2022-01-31 DIAGNOSIS — Z1211 Encounter for screening for malignant neoplasm of colon: Secondary | ICD-10-CM

## 2022-01-31 MED ORDER — PEG 3350-KCL-NA BICARB-NACL 420 G PO SOLR: 4000.0000 mL | Freq: Once | ORAL | 0 refills | Status: AC

## 2022-01-31 NOTE — Telephone Encounter (Signed)
° °  Pre-operative Risk Assessment    Patient Name: [name]  DOB: 1966-06-03 MRN: [id number]      Request for Surgical Clearance    Procedure:   Colonoscopy   Date of Surgery:  Clearance 03/06/22                                 Surgeon:  Not indicated  Surgeon's Group or Practice Name:  [facility] Phone number:  [phone] Fax number:  [phone]   Type of Clearance Requested:   - Pharmacy:  Hold Clopidogrel (Plavix) instructions    Type of Anesthesia:  Not Indicated   Additional requests/questions:    Manfred Arch   01/31/2022, 3:41 PM

## 2022-01-31 NOTE — Progress Notes (Signed)
Gastroenterology Pre-Procedure Review  Request Date: 03/06/2022 Requesting Physician: Dr. Vicente Males  PATIENT REVIEW QUESTIONS: The patient responded to the following health history questions as indicated:    1. Are you having any GI issues? no 2. Do you have a personal history of Polyps? no 3. Do you have a family history of Colon Cancer or Polyps? no 4. Diabetes Mellitus? yes (type 1`) 5. Joint replacements in the past 12 months?no 6. Major health problems in the past 3 months?no 7. Any artificial heart valves, MVP, or defibrillator?no    MEDICATIONS & ALLERGIES:    Patient reports the following regarding taking any anticoagulation/antiplatelet therapy:   Plavix, Coumadin, Eliquis, Xarelto, Lovenox, Pradaxa, Brilinta, or Effient? yes (plavix) Aspirin? yes (asprin)  Patient confirms/reports the following medications:  Current Outpatient Medications  Medication Sig Dispense Refill   albuterol (PROVENTIL HFA;VENTOLIN HFA) 108 (90 Base) MCG/ACT inhaler Inhale 2 puffs into the lungs every 4 (four) hours as needed for wheezing or shortness of breath. 6.7 g 5   allopurinol (ZYLOPRIM) 100 MG tablet Take 100 mg by mouth daily.     ANORO ELLIPTA 62.5-25 MCG/INH AEPB INHALE 1 PUFF INTO THE LUNGS DAILY. 60 each 11   aspirin EC 81 MG tablet Take 81 mg by mouth in the morning. Swallow whole.     atorvastatin (LIPITOR) 40 MG tablet Take 1 tablet (40 mg total) by mouth at bedtime. 90 tablet 1   calcium acetate (PHOSLO) 667 MG capsule Take 667 mg by mouth 3 (three) times daily with meals.     Cholecalciferol (D3-1000) 25 MCG (1000 UT) tablet Take 2 tablets (2,000 Units total) by mouth daily. 60 tablet 3   clopidogrel (PLAVIX) 75 MG tablet Take 1 tablet (75 mg total) by mouth daily with breakfast. 90 tablet 0   docusate sodium (COLACE) 100 MG capsule Take 1 capsule (100 mg total) by mouth daily. 90 capsule 3   gabapentin (NEURONTIN) 300 MG capsule Take 300 mg by mouth at bedtime.     glimepiride (AMARYL)  4 MG tablet Take 4 mg by mouth daily with breakfast.     insulin degludec (TRESIBA FLEXTOUCH) 100 UNIT/ML FlexTouch Pen Inject 20 Units into the skin at bedtime. 6 mL 3   isosorbide mononitrate (IMDUR) 30 MG 24 hr tablet Take 0.5 tablets (15 mg total) by mouth daily. 15 tablet 3   metoprolol succinate (TOPROL-XL) 50 MG 24 hr tablet Take 1 tablet (50 mg total) by mouth daily. 30 tablet 3   Multiple Vitamins-Minerals (TAB-A-VITE) TABS TAKE 1 TABLET BY MOUTH ONCE DAILY. 90 tablet 3   OXYGEN Inhale 3 L into the lungs daily.     pantoprazole (PROTONIX) 40 MG tablet Take 1 tablet (40 mg total) by mouth daily. 90 tablet 0   Risankizumab-rzaa (SKYRIZI) 150 MG/ML SOSY Inject 150 mg into the skin as directed. Every 12 weeks for maintenance. 1 mL 1   Roflumilast (ZORYVE) 0.3 % CREA Apply 1 application topically as directed. Qd to aa psoriasis on body until clear, then prn flares 60 g 4   sacubitril-valsartan (ENTRESTO) 49-51 MG Take 1 tablet by mouth 2 (two) times daily. STOP taking your Losartan and START taking Entresto. 60 tablet 5   silodosin (RAPAFLO) 4 MG CAPS capsule TAKE 1 CAPSULE ONCE DAILY WITH BREAKFAST 90 capsule 3   Suvorexant (BELSOMRA) 5 MG TABS Take 5 mg by mouth at bedtime as needed. 30 tablet 3   tiZANidine (ZANAFLEX) 2 MG tablet Take 1 tablet (2 mg total) by mouth  daily as needed for muscle spasms. 30 tablet 3   torsemide 40 MG TABS Take TWO tablets (80 mg) TWICE daily only on days you DO NOT have dialysis: Tuesday, Thursday, Saturday and Sunday 180 tablet 3   triamcinolone (KENALOG) 0.025 % cream Apply 1 application topically daily in the afternoon.     TRULICITY 3 JG/8.1LX SOPN Inject 3 mg into the skin every Thursday.     No current facility-administered medications for this visit.    Patient confirms/reports the following allergies:  Allergies  Allergen Reactions   Shellfish Allergy Anaphylaxis    Face and throat swelling, difficulty breathing Allergy can be triggered by touching  (contact)   Betadine [Povidone Iodine] Rash   Povidone-Iodine Rash    No orders of the defined types were placed in this encounter.   AUTHORIZATION INFORMATION Primary Insurance: 1D#: Group #:  Secondary Insurance: 1D#: Group #:  SCHEDULE INFORMATION: Date: 03/06/2022 Time: Location:[facility]

## 2022-02-01 NOTE — Telephone Encounter (Signed)
Dr. Brett Canales saw this patient 2 days ago on 01/30/21 for follow up of recent CABG (08/2021). We are asked for clearance and to hold plavix for colonoscopy. Of note, he is on DAPT with ASA and plavix.  In your opinion, is he cleared for c-scope on 03/06/22 and can he hold plavix with recent CABG? He will be approximately 6 months out from CABG.

## 2022-02-05 ENCOUNTER — Other Ambulatory Visit: Payer: Self-pay | Admitting: Dermatology

## 2022-02-05 DIAGNOSIS — L409 Psoriasis, unspecified: Secondary | ICD-10-CM

## 2022-02-05 NOTE — Telephone Encounter (Signed)
° °  Name: [name]  DOB: 06-07-1966  MRN: [id number]   Primary Cardiologist: Kate Sable, MD  Chart reviewed as part of pre-operative protocol coverage.  [name] was last seen on 01/30/22 by Dr. Garen Lah.  He is s/p recent CABG (08/2021) and remains on DAPT. We are asked to hold plavix for colonoscopy. Per Dr. Garen Lah, he may hold plavix for procedure and restart ASAP after.   Please continue ASA without interruption.   I will route this recommendation to the requesting party via Epic fax function and remove from pre-op pool. Please call with questions.  Tami Lin Ilay Capshaw, PA 02/05/2022, 7:53 AM

## 2022-02-07 ENCOUNTER — Ambulatory Visit: Payer: Medicare Other | Admitting: Cardiology

## 2022-02-07 ENCOUNTER — Telehealth: Payer: Self-pay | Admitting: Cardiology

## 2022-02-07 ENCOUNTER — Telehealth: Payer: Self-pay

## 2022-02-07 ENCOUNTER — Other Ambulatory Visit: Payer: Self-pay

## 2022-02-07 DIAGNOSIS — I4901 Ventricular fibrillation: Secondary | ICD-10-CM

## 2022-02-07 NOTE — Telephone Encounter (Signed)
Patient called back I let her know I received the blood thinner clearance back and dr agbor-etang said it was ok to stop his plavix before procedure and restart asap after procedure

## 2022-02-07 NOTE — Telephone Encounter (Signed)
Left message for patient to inform Dellie Catholic has cancelled his appointment for today due to patient did not confirm through [facility].

## 2022-02-07 NOTE — Telephone Encounter (Signed)
CALLED PATIENT NO ANSWER LEFT VOICEMAIL FOR A CALL BACK Blood thinner info

## 2022-02-07 NOTE — Telephone Encounter (Signed)
°  1. Is this related to a heart monitor you are wearing?    Zoll Lifevest   2. What is your issue??   Per zoll last download 1/25 .  Please call to confirm dc order as transmissions have stopped .     Please route to covering RN/CMA/RMA for results. Route to monitor technicians or your monitor tech representative for your site for any technical concerns

## 2022-02-07 NOTE — Telephone Encounter (Signed)
Rescheduled with [facility] closing encounter .

## 2022-02-07 NOTE — Progress Notes (Deleted)
Electrophysiology Office Follow up Visit Note:    Date:  02/07/2022   ID:  [name], DOB October 11, 1966, MRN [id number]  PCP:  Venita Lick, NP  [facility] Cardiologist:  Kate Sable, MD  [facility] Electrophysiologist:  Vickie Epley, MD    Interval History:    [name] is a 56 y.o. male who presents for a follow up visit. I last saw him 01/30/2022 in follow up for his CAD and hx of VF arrest.  We planned a cardiac MRI which has not yet completed.      Past Medical History:  Diagnosis Date   Anemia of chronic disease    Bell's palsy    Cataract    Chronic combined systolic (congestive) and diastolic (congestive) heart failure (HCC)    Chronic respiratory failure with hypoxia (HCC)    3 lpm continuous   COPD (chronic obstructive pulmonary disease) (HCC)    Coronary artery disease    Diabetes mellitus without complication (HCC)    Dyspnea    Wheezing   ESRD on hemodialysis (HCC)    GERD (gastroesophageal reflux disease)    Gout    HOH (hard of hearing)    mild   Hypercholesterolemia    Hypertension    NSVT (nonsustained ventricular tachycardia)    Obstructive sleep apnea    CPAP, O2 use continuosly at 3LPM   Orthopnea    Pneumonia    In Past X2 most recent 1 yr ago   Psoriasis    Pulmonary HTN ([place])    Ventricular fibrillation [facility])     Past Surgical History:  Procedure Laterality Date   A/V FISTULAGRAM Right 05/02/2021   Procedure: A/V FISTULAGRAM;  Surgeon: Katha Cabal, MD;  Location: [place] [facility];  Service: Cardiovascular;  Laterality: Right;   AV FISTULA PLACEMENT Right 11/20/2019   Procedure: ARTERIOVENOUS (AV) FISTULA CREATION ( BRACHIAL CEPHALIC );  Surgeon: Katha Cabal, MD;  Location: [facility] ORS;  Service: Vascular;  Laterality: Right;   CATARACT EXTRACTION W/PHACO Left 12/17/2019   Procedure: CATARACT EXTRACTION PHACO AND INTRAOCULAR LENS PLACEMENT ([place]) LEFT ISTENT INJ DIABETIC;  Surgeon: Eulogio Bear, MD;  Location: [facility] ORS;  Service: Ophthalmology;  Laterality: Left;  [place] 00:33.2 CDE 2.96 Fluid Pack Lot # [id number]   CATARACT EXTRACTION W/PHACO Right 02/24/2020   Procedure: CATARACT EXTRACTION PHACO AND INTRAOCULAR LENS PLACEMENT (I[facility]) RIGHT DIABETIC ISTENT INJ;  Surgeon: Eulogio Bear, MD;  Location: [facility] ORS;  Service: Ophthalmology;  Laterality: Right;  [place] 00:37.3 CDE 2.88 Fluid Pack Lot # [id number]   CORONARY ANGIOPLASTY WITH STENT PLACEMENT     stent placement   CORONARY STENT INTERVENTION N/A 08/28/2021   Procedure: CORONARY STENT INTERVENTION;  Surgeon: Nelva Bush, MD;  Location: [place] CV LAB;  Service: Cardiovascular;  Laterality: N/A;   DIALYSIS/PERMA CATHETER INSERTION N/A 06/22/2019   Procedure: DIALYSIS/PERMA CATHETER INSERTION;  Surgeon: Algernon Huxley, MD;  Location: [place] [facility];  Service: Cardiovascular;  Laterality: N/A;   DIALYSIS/PERMA CATHETER REMOVAL N/A 05/31/2020   Procedure: DIALYSIS/PERMA CATHETER REMOVAL;  Surgeon: Katha Cabal, MD;  Location: [place] [facility];  Service: Cardiovascular;  Laterality: N/A;   INSERTION OF AHMED VALVE Left 12/17/2019   Procedure: INSERTION OF iSTENT;  Surgeon: Eulogio Bear, MD;  Location: [facility] ORS;  Service: Ophthalmology;  Laterality: Left;   INTRAVASCULAR IMAGING/OCT N/A 08/28/2021   Procedure: INTRAVASCULAR IMAGING/OCT;  Surgeon: Nelva Bush, MD;  Location: [place] [facility] LAB;  Service: Cardiovascular;  Laterality: N/A;   LEFT HEART CATH AND CORONARY ANGIOGRAPHY N/A 08/08/2021   Procedure: LEFT HEART CATH AND CORONARY ANGIOGRAPHY;  Surgeon: Nelva Bush, MD;  Location: [place] CV [facility];  Service: Cardiovascular;  Laterality: N/A;    Current Medications: No outpatient medications have been marked as taking for the 02/07/22 encounter (Appointment) with Vickie Epley, MD.     Allergies:   Shellfish allergy, Betadine [povidone iodine], and Povidone-iodine   Social  History   Socioeconomic History   Marital status: Single    Spouse name: Not on file   Number of children: Not on file   Years of education: Not on file   Highest education level: Not on file  Occupational History    Comment: disabled  Tobacco Use   Smoking status: Never   Smokeless tobacco: Never  Vaping Use   Vaping Use: Never used  Substance and Sexual Activity   Alcohol use: Yes    Alcohol/week: 1.0 standard drink    Types: 1 Cans of beer per week    Comment: rare occassion   Drug use: Not Currently    Types: Marijuana    Comment: in high school    Sexual activity: Not on file  Other Topics Concern   Not on file  Social History Narrative   Patient uses continuous oxygen at 3L/min   Dialysis, AV fistula in L arm   Lives at home by himself ; friend gives pt. Transportation    Social Determinants of Health   Financial Resource Strain: Not on file  Food Insecurity: Not on file  Transportation Needs: Not on file  Physical Activity: Not on file  Stress: Not on file  Social Connections: Not on file     Family History: The patient's family history includes Heart failure in his mother; Kidney failure in his brother.  ROS:   Please see the history of present illness.    All other systems reviewed and are negative.  EKGs/Labs/Other Studies Reviewed:    The following studies were reviewed today: ***  EKG:  The ekg ordered today demonstrates ***  Recent Labs: 05/08/2021: ALT 16 05/19/2021: Magnesium 1.9 01/08/2022: B Natriuretic Peptide 116.9; TSH 1.712 01/14/2022: BUN 52; Creatinine, Ser 11.31; Platelets 212; Potassium 5.4; Sodium 134 01/30/2022: Hemoglobin 10.6  Recent Lipid Panel    Component Value Date/Time   CHOL 100 05/09/2021 0453   CHOL 116 04/21/2019 1334   TRIG 117 05/09/2021 0453   HDL 24 (L) 05/09/2021 0453   HDL 37 (L) 04/21/2019 1334   CHOLHDL 4.2 05/09/2021 0453   VLDL 23 05/09/2021 0453   LDLCALC 53 05/09/2021 0453   LDLCALC 63 04/21/2019 1334     Physical Exam:    VS:  There were no vitals taken for this visit.    Wt Readings from Last 3 Encounters:  01/30/22 260 lb (117.9 kg)  01/23/22 256 lb 3.2 oz (116.2 kg)  01/18/22 264 lb (119.7 kg)     GEN: *** Well nourished, well developed in no acute distress HEENT: Normal NECK: No JVD; No carotid bruits LYMPHATICS: No lymphadenopathy CARDIAC: ***RRR, no murmurs, rubs, gallops RESPIRATORY:  Clear to auscultation without rales, wheezing or rhonchi  ABDOMEN: Soft, non-tender, non-distended MUSCULOSKELETAL:  No edema; No deformity  SKIN: Warm and dry NEUROLOGIC:  Alert and oriented x 3 PSYCHIATRIC:  Normal affect        ASSESSMENT:    No diagnosis found. PLAN:    In order of problems listed above:  Cardiac MRI ?  ICD pending results of MRI       Total time spent with patient today *** minutes. This includes reviewing records, evaluating the patient and coordinating care.   Medication Adjustments/Labs and Tests Ordered: Current medicines are reviewed at length with the patient today.  Concerns regarding medicines are outlined above.  No orders of the defined types were placed in this encounter.  No orders of the defined types were placed in this encounter.    Signed, Lars Mage, MD, [facility], [facility] 02/07/2022 7:11 AM    Electrophysiology [facility]

## 2022-02-07 NOTE — Telephone Encounter (Signed)
Fwd EP nurse

## 2022-02-08 ENCOUNTER — Telehealth: Payer: Self-pay

## 2022-02-08 NOTE — Chronic Care Management (AMB) (Signed)
Chronic Care Management Pharmacy Assistant   Name: [name]  MRN: [id number] DOB: 11-19-1966  [name] is an 56 y.o. year old male who presents for his initial CCM visit with the clinical pharmacist.  Recent office visits:  01/23/22-Jolene T. Ned Card, NP (PCP) Seen for establish care.Ambulatory referral to Pulmonology. Start on lowest dose of Belsomra 5 MG. Ambulatory referral to Orthopedics. Ambulatory referral to Gastroenterology. Follow up in 4 weeks.   Recent consult visits:  01/30/22-Brian Agbor-Etang, MD (Cardiology) Seen for 4-6 weeks. Labs ordered. DECREASE your Isosorbide (Imdur) to 15 MG once a day. You will need to cut your tablet in half.  INCREASE your Metoprolol Succinate (Toprol XL) to 50 MG once a day. Follow up in 3 weeks.  01/25/22-David Isac Caddy, MD (Dermatology) Neil Crouch wup visit for Psoriasis. Follow up in 6 months.  12/14/21 Gardiner Barefoot, DPM (Podiatry) Seen for a nail problem. Follow up in 10 weeks.  12/07/21-Scott C. Bernardo Heater, MD (Urology) Seen for urinary retention. Start on silodosin 4 mg daily. Follow up in 1 month. 11/30/21-Gregory Eloise Levels, MD (Vascular surgery) Seen for fistula. Follow up in 6 months. 11/29/21-Cameron T. Quentin Ore, MD (Cardiology) General follow up visit.  11/27/21-Brian Agbor-Etang, MD (Cardiology) Seen for a 6 week follow up visit. Ambulatory referral to Urology. DECREASE your Imdur to 30 mg daily. Follow up 4-6 weeks.  10/24/21-Brian Agbor-Etang, MD (Cardiology) Follow up on ECHO. Labs ordered. STOP Furosemide (Lasix). START Torsemide 80 mg TWICE daily only on days that you DO NOT have dialysis: Tuesdays, Thursdays, Saturdays, and Sundays. Follow up in 6 weeks.  10/05/21-Greogy Prudence Davidson, DPM (Podiatry) Seen for foot care. Follow up in 10 weeks.  09/22/21-Brian Agbor-Etang, MD (Cardiology) Follow up for post cath. EKG completed. Follow up in 6 months. 09/05/21-Ryan M. Purcell Mouton (Cardiology) Hospital follow up visit. Start  docusate 100 mg daily. 08/15/21-Ryan M. Purcell Mouton (Cardiology) General follow up visit. HOLD lasix (furosemide) the morning of your procedure.  HOLD Glimeperide the morning of your procedure and HOLD all insulin the morning of your test. Follow up in 3-4 weeks.   Hospital visits:  Medication Reconciliation was completed by comparing discharge summary, patients EMR and Pharmacy list, and upon discussion with patient.  Admitted to the hospital on 01/08/22 due to Left flank pain. Discharge date was 01/14/22. Discharged from [facility]?Medications Started at [facility] Discharge:?? -started None noted  Medication Changes at Hospital Discharge: -Changed None noted  Medications Discontinued at Hospital Discharge: -Stopped None noted  Medications that remain the same after Hospital Discharge:??  -All other medications will remain the same.    Medications: Outpatient Encounter Medications as of 02/08/2022  Medication Sig   albuterol (PROVENTIL HFA;VENTOLIN HFA) 108 (90 Base) MCG/ACT inhaler Inhale 2 puffs into the lungs every 4 (four) hours as needed for wheezing or shortness of breath.   allopurinol (ZYLOPRIM) 100 MG tablet Take 100 mg by mouth daily.   ANORO ELLIPTA 62.5-25 MCG/INH AEPB INHALE 1 PUFF INTO THE LUNGS DAILY.   aspirin EC 81 MG tablet Take 81 mg by mouth in the morning. Swallow whole.   atorvastatin (LIPITOR) 40 MG tablet Take 1 tablet (40 mg total) by mouth at bedtime.   calcium acetate (PHOSLO) 667 MG capsule Take 667 mg by mouth 3 (three) times daily with meals.   Cholecalciferol (D3-1000) 25 MCG (1000 UT) tablet Take 2 tablets (2,000 Units total) by mouth daily.   clopidogrel (PLAVIX) 75 MG tablet Take 1 tablet (  75 mg total) by mouth daily with breakfast.   docusate sodium (COLACE) 100 MG capsule Take 1 capsule (100 mg total) by mouth daily.   gabapentin (NEURONTIN) 300 MG capsule Take 300 mg by mouth at bedtime.   glimepiride (AMARYL) 4 MG  tablet Take 4 mg by mouth daily with breakfast.   insulin degludec (TRESIBA FLEXTOUCH) 100 UNIT/ML FlexTouch Pen Inject 20 Units into the skin at bedtime.   isosorbide mononitrate (IMDUR) 30 MG 24 hr tablet Take 0.5 tablets (15 mg total) by mouth daily.   metoprolol succinate (TOPROL-XL) 50 MG 24 hr tablet Take 1 tablet (50 mg total) by mouth daily.   Multiple Vitamins-Minerals (TAB-A-VITE) TABS TAKE 1 TABLET BY MOUTH ONCE DAILY.   OXYGEN Inhale 3 L into the lungs daily.   pantoprazole (PROTONIX) 40 MG tablet Take 1 tablet (40 mg total) by mouth daily.   Risankizumab-rzaa (SKYRIZI) 150 MG/ML SOSY Inject 150 mg into the skin as directed. Every 12 weeks for maintenance.   sacubitril-valsartan (ENTRESTO) 49-51 MG Take 1 tablet by mouth 2 (two) times daily. STOP taking your Losartan and START taking Entresto.   silodosin (RAPAFLO) 4 MG CAPS capsule TAKE 1 CAPSULE ONCE DAILY WITH BREAKFAST   Suvorexant (BELSOMRA) 5 MG TABS Take 5 mg by mouth at bedtime as needed.   tiZANidine (ZANAFLEX) 2 MG tablet Take 1 tablet (2 mg total) by mouth daily as needed for muscle spasms.   torsemide 40 MG TABS Take TWO tablets (80 mg) TWICE daily only on days you DO NOT have dialysis: Tuesday, Thursday, Saturday and Sunday   triamcinolone (KENALOG) 0.025 % cream Apply 1 application topically daily in the afternoon.   TRULICITY 3 EN/2.7PO SOPN Inject 3 mg into the skin every Thursday.   ZORYVE 0.3 % CREA APPLY AS DIRECTED TO AFFECTED AREAS OF PSORIASIS ONCE DAILY UNTIL CLEAR, THEN USE AS NEEDED FOR FLARES   No facility-administered encounter medications on file as of 02/08/2022.   albuterol (PROVENTIL HFA;VENTOLIN HFA) 108 (90 Base) MCG/ACT inhaler Last filled:01/01/22 30 DS allopurinol (ZYLOPRIM) 100 MG tablet Last filled:01/26/22 28 DS ANORO ELLIPTA 62.5-25 MCG/INH AEPB Last filled:None noted aspirin EC 81 MG tablet Last filled:None noted atorvastatin (LIPITOR) 40 MG tablet Last filled:01/26/22 28 DS calcium acetate  (PHOSLO) 667 MG capsule Last filled:01/26/22 28 DS Cholecalciferol (D3-1000) 25 MCG (1000 UT) tablet Last filled:None noted clopidogrel (PLAVIX) 75 MG tablet Last filled:01/26/22 28 DS docusate sodium (COLACE) 100 MG capsule Last filled:01/26/22 30 DS gabapentin (NEURONTIN) 300 MG capsule Last filled:01/26/22 28 DS glimepiride (AMARYL) 4 MG tablet Last filled:12/04/21 28 DS insulin degludec (TRESIBA FLEXTOUCH) 100 UNIT/ML FlexTouch Pen Last filled:12/26/21 90 DS isosorbide mononitrate (IMDUR) 30 MG 24 hr tablet Last filled:01/30/22 28 DS metoprolol succinate (TOPROL-XL) 50 MG 24 hr tablet Last filled:01/30/22 28 DS pantoprazole (PROTONIX) 40 MG tablet Last filled:01/26/22 28 DS Risankizumab-rzaa (SKYRIZI) 150 MG/ML SOSY Last filled:01/30/22 84 DS sacubitril-valsartan (ENTRESTO) 49-51 MG Last filled:01/26/22 28 DS silodosin (RAPAFLO) 4 MG CAPS capsule Last filled:01/26/22 28 DS Suvorexant (BELSOMRA) 5 MG TABS Last filled:01/23/22 30 DS tiZANidine (ZANAFLEX) 2 MG tablet Last filled:None noted torsemide 40 MG TABS Last filled:01/26/22 30 DS triamcinolone (KENALOG) 0.025 % cream Last EUMPNT:61/44/31 31 DS TRULICITY 3 VQ/0.0QQ SOPN Last filled:12/04/21 30 DS ZORYVE 0.3 % CREA Last filled:None noted   Care Gaps: Hepatitis C Screening:Over due COLONOSCOPY:Never done OPHTHALMOLOGY EXAM:Last completed: Oct 15, 2019 COVID-19 Vaccine:Last completed: Apr 05, 2021  Star Rating Drugs: atorvastatin (LIPITOR) 40 MG tablet Last filled:01/26/22 28 DS insulin degludec (  TRESIBA FLEXTOUCH) 100 UNIT/ML FlexTouch Pen Last filled:12/26/21 90 DS sacubitril-valsartan (ENTRESTO) 49-51 MG Last IEPPIR:51/88/41 28 DS TRULICITY 3 YS/0.6TK SOPN Last filled:12/04/21 30 DS  Myriam Elta [place], Daniel

## 2022-02-12 ENCOUNTER — Telehealth: Payer: Self-pay

## 2022-02-12 ENCOUNTER — Telehealth: Payer: Medicare Other

## 2022-02-12 NOTE — Telephone Encounter (Signed)
Pt called to let Dr Raliegh Ip know the Zoryve tube are too small he would like to go back on the triamcinolone cream

## 2022-02-12 NOTE — Progress Notes (Deleted)
Chronic Care Management Pharmacy Note  02/12/2022 Name:  [name] MRN:  [id number] DOB:  1966-04-14  Summary: ***  Subjective: [name] is an 56 y.o. year old male who is a primary patient of Cannady, Barbaraann Faster, NP.  The CCM team was consulted for assistance with disease management and care coordination needs.    Engaged with patient by telephone for initial visit in response to provider referral for pharmacy case management and/or care coordination services.   Consent to Services:  The patient was given the following information about Chronic Care Management services today, agreed to services, and gave verbal consent: 1. CCM service includes personalized support from designated clinical staff supervised by the primary care provider, including individualized plan of care and coordination with other care providers 2. 24/7 contact phone numbers for assistance for urgent and routine care needs. 3. Service will only be billed when office clinical staff spend 20 minutes or more in a month to coordinate care. 4. Only one practitioner may furnish and bill the service in a calendar month. 5.The patient may stop CCM services at any time (effective at the end of the month) by phone call to the office staff. 6. The patient will be responsible for cost sharing (co-pay) of up to 20% of the service fee (after annual deductible is met). Patient agreed to services and consent obtained.  Patient Care Team: Venita Lick, NP as PCP - General (Nurse Practitioner) Kate Sable, MD as PCP - Cardiology (Cardiology) Vickie Epley, MD as PCP - Electrophysiology (Cardiology) Madelin Rear, [facility] (Pharmacist)  Hospital visits: {Hospital DC Yes/No:[id number]}  Objective:  Lab Results  Component Value Date   CREATININE 11.31 (H) 01/14/2022   CREATININE 9.42 (H) 01/13/2022   CREATININE 12.44 (H) 01/12/2022    Lab Results  Component Value Date   HGBA1C 8.3 01/29/2022   HGBA1C  8.3 (H) 01/09/2022   HGBA1C 7.1 (H) 05/09/2021   Last diabetic Eye exam:  Lab Results  Component Value Date/Time   HMDIABEYEEXA No Retinopathy 09/18/2018 04:14 PM    Last diabetic Foot exam: No results found for: HMDIABFOOTEX      Component Value Date/Time   CHOL 100 05/09/2021 0453   CHOL 116 04/21/2019 1334   CHOL 145 10/08/2018 0955   CHOL 126 06/12/2018 1938   TRIG 117 05/09/2021 0453   TRIG 81 04/21/2019 1334   TRIG 153 (H) 10/08/2018 0955   HDL 24 (L) 05/09/2021 0453   HDL 37 (L) 04/21/2019 1334   HDL 39 (L) 10/08/2018 0955   HDL 35 (L) 06/12/2018 1938   CHOLHDL 4.2 05/09/2021 0453   VLDL 23 05/09/2021 0453   LDLCALC 53 05/09/2021 0453   LDLCALC 63 04/21/2019 1334   LDLCALC 75 10/08/2018 0955   LDLCALC 68 06/12/2018 1938    Hepatic Function Latest Ref Rng & Units 01/10/2022 05/08/2021 01/30/2021  Total Protein 6.5 - 8.1 g/dL - 8.6(H) -  Albumin 3.5 - 5.0 g/dL 3.5 4.3 3.4(L)  AST 15 - 41 U/L - 13(L) -  ALT 0 - 44 U/L - 16 -  Alk Phosphatase 38 - 126 U/L - 145(H) -  Total Bilirubin 0.3 - 1.2 mg/dL - 0.9 -    Lab Results  Component Value Date/Time   TSH 1.712 01/08/2022 12:46 PM   TSH 2.490 07/29/2018 06:45 PM    CBC Latest Ref Rng & Units 01/30/2022 01/29/2022 01/14/2022  WBC 4.0 - 10.5 K/uL - - 8.0  Hemoglobin 13.0 - 17.0 g/dL  10.6(L) 10.7(A) 10.1(L)  Hematocrit 39.0 - 52.0 % 34.5(L) - 32.4(L)  Platelets 150 - 400 K/uL - - 212    No results found for: VD25OH  Clinical ASCVD:  The ASCVD Risk score (Arnett DK, et al., 2019) failed to calculate for the following reasons:   The valid total cholesterol range is 130 to 320 mg/dL   Social History   Tobacco Use  Smoking Status Never  Smokeless Tobacco Never   BP Readings from Last 3 Encounters:  01/30/22 120/80  01/23/22 112/63  01/18/22 110/70   Pulse Readings from Last 3 Encounters:  01/30/22 82  01/23/22 81  01/18/22 90   Wt Readings from Last 3 Encounters:  01/30/22 260 lb (117.9 kg)  01/23/22  256 lb 3.2 oz (116.2 kg)  01/18/22 264 lb (119.7 kg)   BMI Readings from Last 3 Encounters:  01/30/22 46.06 kg/m  01/23/22 44.67 kg/m  01/18/22 46.77 kg/m    Assessment: Review of patient past medical history, allergies, medications, health status, including review of consultants reports, laboratory and other test data, was performed as part of comprehensive evaluation and provision of chronic care management services.   SDOH:  (Social Determinants of Health) assessments and interventions performed: Yes***   CCM Care Plan  Allergies  Allergen Reactions   Shellfish Allergy Anaphylaxis    Face and throat swelling, difficulty breathing Allergy can be triggered by touching (contact)   Betadine [Povidone Iodine] Rash   Povidone-Iodine Rash    Medications Reviewed Today     Reviewed by Eliseo Squires, CMA (Certified Medical Assistant) on 01/31/22 at 1449  Med List Status: <None>   Medication Order Taking? Sig Documenting Provider Last Dose Status Informant  albuterol (PROVENTIL HFA;VENTOLIN HFA) 108 (90 Base) MCG/ACT inhaler [id number] Yes Inhale 2 puffs into the lungs every 4 (four) hours as needed for wheezing or shortness of breath. Doles-Johnson, Teah, NP  Active Self           Med Note Eugenio Hoes, MARINA C   Thu Jun 08, 2021 10:02 AM)    allopurinol (ZYLOPRIM) 100 MG tablet [id number] Yes Take 100 mg by mouth daily. [provider]  Active Self  ANORO ELLIPTA 62.5-25 MCG/INH AEPB [id number] Yes INHALE 1 PUFF INTO THE LUNGS DAILY. Kendell Bane, NP  Active Self           Med Note Britt Bottom   Thu Jun 08, 2021 10:03 AM)    aspirin EC 81 MG tablet [id number] Yes Take 81 mg by mouth in the morning. Swallow whole. [provider]  Active Self  atorvastatin (LIPITOR) 40 MG tablet [id number] Yes Take 1 tablet (40 mg total) by mouth at bedtime. Rise Mu, PA-C  Active   calcium acetate (PHOSLO) 667 MG capsule [id number] Yes Take 667 mg by mouth 3 (three)  times daily with meals. [provider]  Active Self           Med Note Eugenio Hoes, MARINA C   Thu Jun 08, 2021 10:03 AM)    Cholecalciferol (D3-1000) 25 MCG (1000 UT) tablet [id number] Yes Take 2 tablets (2,000 Units total) by mouth daily. Doles-Johnson, Teah, NP  Active   clopidogrel (PLAVIX) 75 MG tablet [id number] Yes Take 1 tablet (75 mg total) by mouth daily with breakfast. Kate Sable, MD  Active   docusate sodium (COLACE) 100 MG capsule [id number] Yes Take 1 capsule (100 mg total) by mouth daily. Rise Mu, PA-C  Active   gabapentin (NEURONTIN) 300  MG capsule [id number] Yes Take 300 mg by mouth at bedtime. [provider]  Active   glimepiride (AMARYL) 4 MG tablet [id number] Yes Take 4 mg by mouth daily with breakfast. [provider]  Active Self           Med Note Eugenio Hoes, MARINA C   Thu Jun 08, 2021 10:05 AM)    insulin degludec (TRESIBA FLEXTOUCH) 100 UNIT/ML FlexTouch Pen [id number] Yes Inject 20 Units into the skin at bedtime. Lavera Guise, MD  Active Self           Med Note [facility], BRANDY L   Tue Sep 05, 2021 11:24 AM)    isosorbide mononitrate (IMDUR) 30 MG 24 hr tablet [id number] Yes Take 0.5 tablets (15 mg total) by mouth daily. Kate Sable, MD  Active   metoprolol succinate (TOPROL-XL) 50 MG 24 hr tablet [id number] Yes Take 1 tablet (50 mg total) by mouth daily. Kate Sable, MD  Active   Multiple Vitamins-Minerals (TAB-A-VITE) TABS [id number] Yes TAKE 1 TABLET BY MOUTH ONCE DAILY. Kendell Bane, NP  Active   OXYGEN [id number] Yes Inhale 3 L into the lungs daily. [provider]  Active   pantoprazole (PROTONIX) 40 MG tablet [id number] Yes Take 1 tablet (40 mg total) by mouth daily. Kate Sable, MD  Active   Risankizumab-rzaa [facility]) 150 MG/ML Babette Relic [id number] Yes Inject 150 mg into the skin as directed. Every 12 weeks for maintenance. Ralene Bathe, MD  Active   Roflumilast (ZORYVE) 0.3 % CREA [id number] Yes  Apply 1 application topically as directed. Qd to aa psoriasis on body until clear, then prn flares Ralene Bathe, MD  Active   sacubitril-valsartan [facility]) 49-51 MG [id number] Yes Take 1 tablet by mouth 2 (two) times daily. STOP taking your Losartan and START taking Entresto. Kate Sable, MD  Active   silodosin (RAPAFLO) 4 MG CAPS capsule [id number] Yes TAKE 1 CAPSULE ONCE DAILY WITH BREAKFAST Stoioff, Scott C, MD  Active   Suvorexant (BELSOMRA) 5 MG TABS [id number] Yes Take 5 mg by mouth at bedtime as needed. Marnee Guarneri T, NP  Active   tiZANidine (ZANAFLEX) 2 MG tablet [id number] Yes Take 1 tablet (2 mg total) by mouth daily as needed for muscle spasms. Marnee Guarneri T, NP  Active   torsemide 40 MG TABS [id number] Yes Take TWO tablets (80 mg) TWICE daily only on days you DO NOT have dialysis: Tuesday, Thursday, Saturday and Sunday Kate Sable, MD  Active   triamcinolone (KENALOG) 0.025 % cream [id number] Yes Apply 1 application topically daily in the afternoon. [provider]  Active Self  TRULICITY 3 HA/5.7XU SOPN [id number] Yes Inject 3 mg into the skin every Thursday. [provider]  Active Self            Patient Active Problem List   Diagnosis Date Noted   Right hip pain 01/23/2022   Insomnia 01/23/2022   Ventricular fibrillation ([place])    COPD (chronic obstructive pulmonary disease) ([place])    Chronic respiratory failure with hypoxia ([place])    Hyperlipidemia associated with type 2 diabetes mellitus ([place])    Hypotension 01/29/2021   Background diabetic retinopathy associated with type 2 diabetes mellitus ([place]) 10/27/2019   Type 2 diabetes mellitus with hyperglycemia, with long-term current use of insulin ([place]) 03/05/2019   Chronic combined systolic (congestive) and diastolic (congestive) heart failure ([place]) 10/07/2018   Morbid obesity with BMI of 50.0-59.9, adult ([place]) 04/23/2018   Obstructive sleep apnea 02/10/2018  Obesity hypoventilation  syndrome ([place]) 09/26/2017   CAD (coronary artery disease) 01/31/2017   Psoriasis 01/17/2017   Chronic gout of multiple sites 12/21/2016   DM type 2 with diabetic peripheral neuropathy ([place]) 06/25/2016   Cardiomyopathy ([place]) 08/19/2012   ESRD (end stage renal disease) on dialysis ([place]) 08/19/2012    Immunization History  Administered Date(s) Administered   Hepatitis B, adult 07/14/2021, 08/14/2021, 09/15/2021   Influenza Inj Mdck Quad Pf 09/25/2019, 10/05/2020, 10/13/2021   Influenza,inj,Quad PF,6+ Mos 01/11/2018, 09/25/2019, 10/05/2020   Influenza-Unspecified 01/11/2018, 09/25/2019, 10/05/2020   Moderna Sars-Covid-2 Vaccination 02/29/2020, 03/30/2020   PFIZER(Purple Top)SARS-COV-2 Vaccination 04/05/2021   PNEUMOCOCCAL CONJUGATE-20 07/06/2021   PPD Test 02/15/2020, 02/15/2020, 08/12/2020, 08/12/2020, 07/12/2021   Pneumococcal Polysaccharide-23 12/26/2016   Tdap 03/28/2021    Conditions to be addressed/monitored: ESRD DMII OSA CAD Gout CHF T2DM Insomnia COPD   There are no care plans that you recently modified to display for this patient.  Current Barriers:  **  Pharmacist Clinical Goal(s):  Patient will {PHARMACYGOALCHOICES:24921} through collaboration with PharmD and provider.   Interventions: 1:1 collaboration with Venita Lick, NP regarding development and update of comprehensive plan of care as evidenced by provider attestation and co-signature Inter-disciplinary care team collaboration (see longitudinal plan of care) Comprehensive medication review performed; medication list updated in electronic medical record  {CCM PHARMD DISEASE STATES:25130}  Patient Goals/Self-Care Activities Patient will:  - {pharmacypatientgoals:24919}  Medication Assistance: {MEDASSISTANCEINFO:25044}  Patient's preferred pharmacy is:  [facility], [place] [address] [place] [address] Phone: [phone] Fax:  [phone], [facility] - [place], [place] [address] [place] [address] Phone: [phone] Fax: [phone]  [facility] [address] [place] [address] Phone: [phone] Fax: [phone]   Pt endorses ***% compliance  Follow Up:  Patient agrees to Care Plan and Follow-up. Plan: [facility] ***. Pharmacist ***  Future Appointments  Date Time Provider Donna  02/20/2022 11:00 AM Marnee Guarneri T, NP CFP-CFP [facility]  02/22/2022 10:40 AM Kate Sable, MD [facility]  03/08/2022  4:00 PM MC-MR 1 MC-MRI [facility]  03/15/2022 11:00 AM Tanda Rockers, MD LBPU-BURL None  03/21/2022  3:00 PM Vickie Epley, MD [facility]  03/22/2022 10:30 AM Gardiner Barefoot, DPM TFC[facility]  06/07/2022 10:00 AM AVVS VASC 2 AVVS-IMG None  06/07/2022 11:00 AM Kris Hartmann, NP AVVS-AVVS None  07/26/2022 11:00 AM Ralene Bathe, MD ASC-ASC None  01/21/2023 11:15 AM Stoioff, Ronda Fairly, MD BUA-BUA None    {jpsigs:26361}

## 2022-02-12 NOTE — Telephone Encounter (Signed)
°  Care Management   Follow Up Note   02/12/2022 Name: [name] MRN: [id number] DOB: 1966/03/16   Referred by: Venita Lick, NP Reason for referral : Chronic Care Management (PharmD initial telephone visit/)   An unsuccessful telephone outreach was attempted today. The patient was referred to the case management team for assistance with care management and care coordination.   Follow Up Plan:  No answer to initial telephone visit with pharmacist 2/27 245p - lvm. Care guide notified.  Madelin Rear, PharmD, [place] Clinical Pharmacist  [facility]  [phone]

## 2022-02-13 ENCOUNTER — Telehealth: Payer: Self-pay

## 2022-02-13 MED ORDER — ZORYVE 0.3 % EX CREA: 1.0000 "application " | TOPICAL_CREAM | Freq: Every day | CUTANEOUS | 0 refills | Status: DC

## 2022-02-13 NOTE — Telephone Encounter (Signed)
LM on VM please return my call  

## 2022-02-13 NOTE — Telephone Encounter (Signed)
Legs back stomach and arms

## 2022-02-13 NOTE — Addendum Note (Signed)
Addended by: Johnsie Kindred R on: 02/13/2022 05:15 PM   Modules accepted: Orders

## 2022-02-13 NOTE — Telephone Encounter (Signed)
Patient advised of information per Dr. Nehemiah Massed. RX sent in.

## 2022-02-13 NOTE — Telephone Encounter (Signed)
Pt returning my call, pt report he did get a 60 gm tube of Zoryve cream from his local pharmacy and there is not enough of cream in a 60 gm tube to cover his arms, abdomen, back and legs, pt report 60 gm of creams will not help him.

## 2022-02-18 NOTE — Patient Instructions (Addendum)
Do no forget to get labs I ordered for hospital lab. ? ?Insomnia ?Insomnia is a sleep disorder that makes it difficult to fall asleep or stay asleep. Insomnia can cause fatigue, low energy, difficulty concentrating, mood swings, and poor performance at work or school. ?There are three different ways to classify insomnia: ?Difficulty falling asleep. ?Difficulty staying asleep. ?Waking up too early in the morning. ?Any type of insomnia can be long-term (chronic) or short-term (acute). Both are common. Short-term insomnia usually lasts for three months or less. Chronic insomnia occurs at least three times a week for longer than three months. ?What are the causes? ?Insomnia may be caused by another condition, situation, or substance, such as: ?Anxiety. ?Certain medicines. ?Gastroesophageal reflux disease (GERD) or other gastrointestinal conditions. ?Asthma or other breathing conditions. ?Restless legs syndrome, sleep apnea, or other sleep disorders. ?Chronic pain. ?Menopause. ?Stroke. ?Abuse of alcohol, tobacco, or illegal drugs. ?Mental health conditions, such as depression. ?Caffeine. ?Neurological disorders, such as Alzheimer's disease. ?An overactive thyroid (hyperthyroidism). ?Sometimes, the cause of insomnia may not be known. ?What increases the risk? ?Risk factors for insomnia include: ?Gender. Women are affected more often than men. ?Age. Insomnia is more common as you get older. ?Stress. ?Lack of exercise. ?Irregular work schedule or working night shifts. ?Traveling between different time zones. ?Certain medical and mental health conditions. ?What are the signs or symptoms? ?If you have insomnia, the main symptom is having trouble falling asleep or having trouble staying asleep. This may lead to other symptoms, such as: ?Feeling fatigued or having low energy. ?Feeling nervous about going to sleep. ?Not feeling rested in the morning. ?Having trouble concentrating. ?Feeling irritable, anxious, or depressed. ?How  is this diagnosed? ?This condition may be diagnosed based on: ?Your symptoms and medical history. Your health care provider may ask about: ?Your sleep habits. ?Any medical conditions you have. ?Your mental health. ?A physical exam. ?How is this treated? ?Treatment for insomnia depends on the cause. Treatment may focus on treating an underlying condition that is causing insomnia. Treatment may also include: ?Medicines to help you sleep. ?Counseling or therapy. ?Lifestyle adjustments to help you sleep better. ?Follow these instructions at home: ?Eating and drinking ? ?Limit or avoid alcohol, caffeinated beverages, and cigarettes, especially close to bedtime. These can disrupt your sleep. ?Do not eat a large meal or eat spicy foods right before bedtime. This can lead to digestive discomfort that can make it hard for you to sleep. ?Sleep habits ? ?Keep a sleep diary to help you and your health care provider figure out what could be causing your insomnia. Write down: ?When you sleep. ?When you wake up during the night. ?How well you sleep. ?How rested you feel the next day. ?Any side effects of medicines you are taking. ?What you eat and drink. ?Make your bedroom a dark, comfortable place where it is easy to fall asleep. ?Put up shades or blackout curtains to block light from outside. ?Use a white noise machine to block noise. ?Keep the temperature cool. ?Limit screen use before bedtime. This includes: ?Watching TV. ?Using your smartphone, tablet, or computer. ?Stick to a routine that includes going to bed and waking up at the same times every day and night. This can help you fall asleep faster. Consider making a quiet activity, such as reading, part of your nighttime routine. ?Try to avoid taking naps during the day so that you sleep better at night. ?Get out of bed if you are still awake after 15 minutes of trying  to sleep. Keep the lights down, but try reading or doing a quiet activity. When you feel sleepy, go back  to bed. ?General instructions ?Take over-the-counter and prescription medicines only as told by your health care provider. ?Exercise regularly, as told by your health care provider. Avoid exercise starting several hours before bedtime. ?Use relaxation techniques to manage stress. Ask your health care provider to suggest some techniques that may work well for you. These may include: ?Breathing exercises. ?Routines to release muscle tension. ?Visualizing peaceful scenes. ?Make sure that you drive carefully. Avoid driving if you feel very sleepy. ?Keep all follow-up visits as told by your health care provider. This is important. ?Contact a health care provider if: ?You are tired throughout the day. ?You have trouble in your daily routine due to sleepiness. ?You continue to have sleep problems, or your sleep problems get worse. ?Get help right away if: ?You have serious thoughts about hurting yourself or someone else. ?If you ever feel like you may hurt yourself or others, or have thoughts about taking your own life, get help right away. You can go to your nearest emergency department or call: ?Your local emergency services (911 in the [place] ?A suicide crisis helpline, such as the [facility] at 250-332-5725 or 988 in the [place]. This is open 24 hours a day. ?Summary ?Insomnia is a sleep disorder that makes it difficult to fall asleep or stay asleep. ?Insomnia can be long-term (chronic) or short-term (acute). ?Treatment for insomnia depends on the cause. Treatment may focus on treating an underlying condition that is causing insomnia. ?Keep a sleep diary to help you and your health care provider figure out what could be causing your insomnia. ?This information is not intended to replace advice given to you by your health care provider. Make sure you discuss any questions you have with your health care provider. ?Document Revised: 06/28/2021 Document Reviewed: 10/13/2020 ?Elsevier Patient  Education ? [place]. ? ?

## 2022-02-20 ENCOUNTER — Other Ambulatory Visit: Payer: Self-pay

## 2022-02-20 ENCOUNTER — Ambulatory Visit (INDEPENDENT_AMBULATORY_CARE_PROVIDER_SITE_OTHER): Payer: Medicare Other | Admitting: Nurse Practitioner

## 2022-02-20 ENCOUNTER — Encounter: Payer: Self-pay | Admitting: Nurse Practitioner

## 2022-02-20 VITALS — BP 119/79 | HR 80 | Temp 98.3°F | Ht 63.0 in | Wt 259.8 lb

## 2022-02-20 DIAGNOSIS — Z794 Long term (current) use of insulin: Secondary | ICD-10-CM

## 2022-02-20 DIAGNOSIS — Z1159 Encounter for screening for other viral diseases: Secondary | ICD-10-CM

## 2022-02-20 DIAGNOSIS — I5042 Chronic combined systolic (congestive) and diastolic (congestive) heart failure: Secondary | ICD-10-CM

## 2022-02-20 DIAGNOSIS — E1165 Type 2 diabetes mellitus with hyperglycemia: Secondary | ICD-10-CM | POA: Diagnosis not present

## 2022-02-20 DIAGNOSIS — F5104 Psychophysiologic insomnia: Secondary | ICD-10-CM

## 2022-02-20 DIAGNOSIS — M25551 Pain in right hip: Secondary | ICD-10-CM

## 2022-02-20 DIAGNOSIS — N400 Enlarged prostate without lower urinary tract symptoms: Secondary | ICD-10-CM

## 2022-02-20 DIAGNOSIS — R1110 Vomiting, unspecified: Secondary | ICD-10-CM | POA: Insufficient documentation

## 2022-02-20 DIAGNOSIS — Z6841 Body Mass Index (BMI) 40.0 and over, adult: Secondary | ICD-10-CM

## 2022-02-20 DIAGNOSIS — E538 Deficiency of other specified B group vitamins: Secondary | ICD-10-CM

## 2022-02-20 DIAGNOSIS — R112 Nausea with vomiting, unspecified: Secondary | ICD-10-CM

## 2022-02-20 MED ORDER — BELSOMRA 10 MG PO TABS: 10.0000 mg | ORAL_TABLET | Freq: Every evening | ORAL | 4 refills | Status: DC | PRN

## 2022-02-20 MED ORDER — ONDANSETRON HCL 4 MG PO TABS: 4.0000 mg | ORAL_TABLET | Freq: Three times a day (TID) | ORAL | 3 refills | Status: AC | PRN

## 2022-02-20 NOTE — Assessment & Plan Note (Signed)
Chronic, ongoing with recent A1c 8.3%, is followed by endocrinology, but suspect he may not return.  At this time recommend continue current medication regimen -- Lantus 20 units every night and Trulicity + Glimepiride.  May increase Trulicity next visit to 4.5 MG weekly.  Would benefit from [facility], will place referral to CCM team for assist.  He reports he can not poke fingers.  Recommend focus on diabetic diet at home.  Have ordered labs for him to obtain in hospital upcoming with his cardiology labs. ?

## 2022-02-20 NOTE — Progress Notes (Signed)
BP 119/79    Pulse 80    Temp 98.3 F (36.8 C) (Oral)    Ht '5\' 3"'$  (1.6 m)    Wt 259 lb 12.8 oz (117.8 kg)    BMI 46.02 kg/m    Subjective:    Patient ID: [name], male    DOB: 08/04/1966, 56 y.o.   MRN: [id number]  HPI: [name] is a 56 y.o. male  Chief Complaint  Patient presents with   Hip Pain   Insomnia   Emesis    Patient states he is not sure if it is medication related, but he is not able to keep anything down. Patient states he either has vomiting or diarrhea. Patient states he has been having issues with it since he was released from the hospital. Patient is also noticing he has not been able to gain weight.    Underlying diabetes with ESRD receiving dialysis, recent labs sent over and reviewed, to be scanned into chart.  HIP PAIN To left hip since he left hospital, no trauma in hospital.  Referral to ortho placed last visit and Tizanidine sent in.  He reports hip is improved at this time and he did not attend ortho.  Only used muscle relaxer a couple times.   Duration: weeks Involved hip: right  Mechanism of injury: unknown Location: lateral Onset: sudden  Severity: 0/10  Quality: sharp, dull, and throbbing Frequency: constant Radiation: no Aggravating factors: weight bearing, walking, bending, and movement   Alleviating factors: nothing  Status: stable Treatments attempted: rest, heat, and APAP   Relief with NSAIDs?: No NSAIDs Taken Weakness with weight bearing: no Weakness with walking: no Paresthesias / decreased sensation: no Swelling: no Redness:no Fevers: no   INSOMNIA Ongoing issue for 2 years, does have OSA.  Has tried Trazodone, Melatonin, Tramadol, muscle relaxer.  Last visit we jointly decided to trial a low dose of Belsomra.  He reports this works, but does not last long. Duration: chronic Satisfied with sleep quality: yes Difficulty falling asleep: yes Difficulty staying asleep: yes Waking a few hours after sleep onset:  yes Early morning awakenings: yes Daytime hypersomnolence: yes Wakes feeling refreshed: no Good sleep hygiene: yes Apnea: yes Snoring: no Depressed/anxious mood: no Recent stress: no Restless legs/nocturnal leg cramps: no Chronic pain/arthritis: no History of sleep study: yes Treatments attempted: melatonin and as above    EMESIS Has had this since leaving hospital with pyelonephritis.  Last saw urology 01/18/22.  He goes for colonoscopy on 03/06/22.  Reports since leaving hospital he has not been able to gain weight.  Is on Trulicity at baseline. Alleviating factors:  Aggravating factors: Status: fluctuating Treatments attempted: none Fever: no Nausea: yes Vomiting: no Weight loss: no Decreased appetite:  can not eat as much -- is on Trulicity Diarrhea: yes Constipation: no Blood in stool: no Heartburn: no Jaundice: no Rash: no Dysuria/urinary frequency: no Hematuria: no History of sexually transmitted disease: no Recurrent NSAID use: no   Relevant past medical, surgical, family and social history reviewed and updated as indicated. Interim medical history since our last visit reviewed. Allergies and medications reviewed and updated.  Review of Systems  Constitutional:  Negative for activity change, diaphoresis, fatigue and fever.  Respiratory:  Negative for cough, chest tightness, shortness of breath and wheezing.   Cardiovascular:  Negative for chest pain, palpitations and leg swelling.  Gastrointestinal:  Positive for diarrhea, nausea and vomiting. Negative for abdominal distention, abdominal pain and constipation.  Endocrine: Negative for  cold intolerance, heat intolerance, polydipsia, polyphagia and polyuria.  Musculoskeletal:  Negative for arthralgias.  Neurological: Negative.   Psychiatric/Behavioral:  Positive for sleep disturbance. Negative for decreased concentration, self-injury and suicidal ideas. The patient is nervous/anxious.    Per HPI unless specifically  indicated above     Objective:    BP 119/79    Pulse 80    Temp 98.3 F (36.8 C) (Oral)    Ht '5\' 3"'$  (1.6 m)    Wt 259 lb 12.8 oz (117.8 kg)    BMI 46.02 kg/m   Wt Readings from Last 3 Encounters:  02/20/22 259 lb 12.8 oz (117.8 kg)  01/30/22 260 lb (117.9 kg)  01/23/22 256 lb 3.2 oz (116.2 kg)    Physical Exam Vitals and nursing note reviewed.  Constitutional:      General: He is awake. He is not in acute distress.    Appearance: He is well-developed and well-groomed. He is morbidly obese. He is not ill-appearing or toxic-appearing.  HENT:     Head: Normocephalic and atraumatic.     Right Ear: Hearing normal. No drainage.     Left Ear: Hearing normal. No drainage.  Eyes:     General: Lids are normal.        Right eye: No discharge.        Left eye: No discharge.     Conjunctiva/sclera: Conjunctivae normal.     Pupils: Pupils are equal, round, and reactive to light.  Neck:     Thyroid: No thyromegaly.     Vascular: No carotid bruit.  Cardiovascular:     Rate and Rhythm: Normal rate and regular rhythm.     Heart sounds: Normal heart sounds, S1 normal and S2 normal. No murmur heard.   No gallop.     Arteriovenous access: Left arteriovenous access is present. Pulmonary:     Effort: Pulmonary effort is normal. No accessory muscle usage or respiratory distress.     Breath sounds: Normal breath sounds.     Comments: O2 by nasal cannula in place, 3L. Abdominal:     General: Bowel sounds are normal. There is no distension.     Palpations: Abdomen is soft.     Tenderness: There is no abdominal tenderness.  Musculoskeletal:        General: Normal range of motion.     Cervical back: Normal range of motion and neck supple.     Right hip: Normal.     Left hip: Tenderness (lateral aspect) present. No deformity, bony tenderness or crepitus. Normal range of motion. Normal strength.     Right lower leg: No edema.     Left lower leg: No edema.  Lymphadenopathy:     Cervical: No  cervical adenopathy.  Skin:    General: Skin is warm and dry.     Capillary Refill: Capillary refill takes less than 2 seconds.     Findings: No rash.  Neurological:     Mental Status: He is alert and oriented to person, place, and time.     Deep Tendon Reflexes: Reflexes are normal and symmetric.  Psychiatric:        Attention and Perception: Attention normal.        Mood and Affect: Mood normal.        Speech: Speech normal.        Behavior: Behavior normal. Behavior is cooperative.        Thought Content: Thought content normal.    Results for orders placed  or performed in visit on 02/07/22  CBC and differential  Result Value Ref Range   Hemoglobin 10.7 (A) 13.5 - 41.9  Basic metabolic panel  Result Value Ref Range   Glucose 155    Potassium 4.9 3.4 - 5.3  Comprehensive metabolic panel  Result Value Ref Range   Calcium 8.5 (A) 8.7 - 10.7  Hemoglobin A1c  Result Value Ref Range   Hemoglobin A1C 8.3       Assessment & Plan:   Problem List Items Addressed This Visit       Cardiovascular and Mediastinum   Chronic combined systolic (congestive) and diastolic (congestive) heart failure (HCC)    Chronic and ongoing, followed by cardiology and HF clinic.  Euvolemic today.  Recent notes reviewed and will continue this collaboration + medications as ordered by them.  Recommend: - Reminded to call for an overnight weight gain of >2 pounds or a weekly weight gain of >5 pounds - not adding salt to food and read food labels. Reviewed the importance of keeping daily sodium intake to '2000mg'$  daily. - Avoid Ibuprofen products - Have ordered labs for lab at hospital -- to obtain with his cardiology labs at next visit.      Relevant Orders   Lipid Panel w/o Chol/HDL Ratio   HgB A1c     Digestive   Emesis    Ongoing issue since hospitalization.  ?related to Trulicity, although he has been on for some time.  Has upcoming colonoscopy with GI will review after this. He does endorse  not being able to eat big meals, discussed with him affect of Trulicity on GI motility and recommend smaller. Healthy meals throughout daytime hours.  Zofran sent to use as needed.        Endocrine   Type 2 diabetes mellitus with hyperglycemia, with long-term current use of insulin (HCC) - Primary    Chronic, ongoing with recent A1c 8.3%, is followed by endocrinology, but suspect he may not return.  At this time recommend continue current medication regimen -- Lantus 20 units every night and Trulicity + Glimepiride.  May increase Trulicity next visit to 4.5 MG weekly.  Would benefit from [employer], will place referral to CCM team for assist.  He reports he can not poke fingers.  Recommend focus on diabetic diet at home.  Have ordered labs for him to obtain in hospital upcoming with his cardiology labs.      Relevant Orders   TSH   CBC with Differential/Platelet   HgB A1c     Other   Insomnia    Chronic, ongoing, has tried multiple medications.  Would avoid antipsychotic family (Seroquel) as could affect diabetes and weight.  Has tried Trazodone and Melatonin.  Uses Trilogy every night.  Discussed at length medications with him.  Would avoid high dose of any sleep aide due to OSA and overall health.  At this time will increase Belsomra to 10 MG -- monitor closely and stop if increased fatigued.  PDMP reviewed.  Return in upcoming weeks for follow-up, have discussed medication at length with patient and discussed why high doses will be avoided.      Morbid obesity with BMI of 50.0-59.9, adult (HCC)    BMI 46.02 with T2DM, HTN/HLD.  Recommended eating smaller high protein, low fat meals more frequently and exercising 30 mins a day 5 times a week with a goal of 10-15lb weight loss in the next 3 months. Patient voiced their understanding and motivation to adhere  to these recommendations.       Right hip pain    Improved at this time, no further intervention needed.  Monitor.      Other Visit  Diagnoses     B12 deficiency       B12 level to be obtained outpatient.   Relevant Orders   Vitamin B12   Benign prostatic hyperplasia without lower urinary tract symptoms       PSA on outpatient labs.   Relevant Orders   PSA   Need for hepatitis C screening test       Hep C screening on outpatient labs, discussed with patient.   Relevant Orders   Hepatitis C Antibody        Follow up plan: Return in about 2 months (around 04/22/2022) for T2DM, HTN/HLD, CKD, INSOMNIA, HF.

## 2022-02-20 NOTE — Assessment & Plan Note (Signed)
Chronic, ongoing, has tried multiple medications.  Would avoid antipsychotic family (Seroquel) as could affect diabetes and weight.  Has tried Trazodone and Melatonin.  Uses Trilogy every night.  Discussed at length medications with him.  Would avoid high dose of any sleep aide due to OSA and overall health.  At this time will increase Belsomra to 10 MG -- monitor closely and stop if increased fatigued.  PDMP reviewed.  Return in upcoming weeks for follow-up, have discussed medication at length with patient and discussed why high doses will be avoided. ?

## 2022-02-20 NOTE — Assessment & Plan Note (Signed)
Chronic and ongoing, followed by cardiology and HF clinic.  Euvolemic today.  Recent notes reviewed and will continue this collaboration + medications as ordered by them.  Recommend: ?- Reminded to call for an overnight weight gain of >2 pounds or a weekly weight gain of >5 pounds ?- not adding salt to food and read food labels. Reviewed the importance of keeping daily sodium intake to '2000mg'$  daily. ?- Avoid Ibuprofen products ?- Have ordered labs for lab at hospital -- to obtain with his cardiology labs at next visit. ?

## 2022-02-20 NOTE — Assessment & Plan Note (Signed)
Improved at this time, no further intervention needed.  Monitor. ?

## 2022-02-20 NOTE — Assessment & Plan Note (Signed)
BMI 46.02 with T2DM, HTN/HLD.  Recommended eating smaller high protein, low fat meals more frequently and exercising 30 mins a day 5 times a week with a goal of 10-15lb weight loss in the next 3 months. Patient voiced their understanding and motivation to adhere to these recommendations. ? ?

## 2022-02-20 NOTE — Assessment & Plan Note (Signed)
Ongoing issue since hospitalization.  ?related to Trulicity, although he has been on for some time.  Has upcoming colonoscopy with GI will review after this. He does endorse not being able to eat big meals, discussed with him affect of Trulicity on GI motility and recommend smaller. Healthy meals throughout daytime hours.  Zofran sent to use as needed. ?

## 2022-02-22 ENCOUNTER — Ambulatory Visit: Payer: Medicaid Other | Admitting: Cardiology

## 2022-02-23 ENCOUNTER — Encounter: Payer: Self-pay | Admitting: Cardiology

## 2022-02-23 ENCOUNTER — Other Ambulatory Visit: Payer: Self-pay | Admitting: *Deleted

## 2022-02-23 DIAGNOSIS — I2510 Atherosclerotic heart disease of native coronary artery without angina pectoris: Secondary | ICD-10-CM

## 2022-02-23 MED ORDER — PANTOPRAZOLE SODIUM 40 MG PO TBEC: 40.0000 mg | DELAYED_RELEASE_TABLET | Freq: Every day | ORAL | 0 refills | Status: DC

## 2022-02-26 ENCOUNTER — Telehealth: Payer: Self-pay

## 2022-02-26 NOTE — Telephone Encounter (Signed)
Pt declined rescheduling Initial appt  ?

## 2022-02-26 NOTE — Chronic Care Management (AMB) (Signed)
?  Care Management  ? ?Note ? ?02/26/2022 ?Name: [name] MRN: [id number] DOB: [birth year] ? ?[name] is a 56 y.o. year old male who is a primary care patient of Venita Lick, NP and is actively engaged with the care management team. I reached out to [name] by phone today to assist with re-scheduling an initial visit with the Pharmacist ? ?Follow up plan: ?Patient declines further follow up and engagement by the care management team. Appropriate care team members and provider have been notified via electronic communication.  ? ?Noreene Larsson, RMA ?Care Guide, Embedded Care Coordination ?[facility]  ?[place], [place] [address] ?Direct Dial: [phone] ?[email or web address] ?Website: [email or web address]  ? ?

## 2022-02-28 ENCOUNTER — Telehealth: Payer: Self-pay | Admitting: Cardiology

## 2022-02-28 NOTE — Telephone Encounter (Signed)
Calling to see if patient received ICD also the life vest order has expired, please assist ?

## 2022-03-01 ENCOUNTER — Telehealth: Payer: Self-pay | Admitting: Nurse Practitioner

## 2022-03-01 NOTE — Telephone Encounter (Signed)
Faxed order that was placed in Feb to renew life vest. Sent appointment note with MRI date and follow up date with Dr. Quentin Ore.  ?[name] verbalized agreement.  ?

## 2022-03-01 NOTE — Telephone Encounter (Signed)
Copied from [place] [phone]. Topic: Medicare AWV ?>> Mar 01, 2022  4:29 PM [name] wrote: ?Reason for CRM:  ?Called patient to schedule AWV. ? ?While on the phone he asked that I send a message in regards to checking his sugars. He states that he doesn't have a meter or any way to check his sugar and he is concerned about this. I advised patient that I would send a message to the provider and someone from the office would call him in regards to this. ?

## 2022-03-02 ENCOUNTER — Telehealth: Payer: Self-pay | Admitting: Nurse Practitioner

## 2022-03-02 MED ORDER — ACCU-CHEK SOFTCLIX LANCETS MISC: 12 refills | Status: DC

## 2022-03-02 MED ORDER — ACCU-CHEK GUIDE ME W/DEVICE KIT: PACK | 0 refills | Status: DC

## 2022-03-02 MED ORDER — GLUCOSE BLOOD VI STRP: ORAL_STRIP | 12 refills | Status: DC

## 2022-03-02 NOTE — Telephone Encounter (Signed)
Copied from [place] [id number] [id number]. Topic: Medicare AWV ?>> Mar 01, 2022  4:29 PM [name] wrote: ?Reason for CRM:  ?Called patient to schedule AWV. ? ?While on the phone he asked that I send a message in regards to checking his sugars. He states that he doesn't have a meter or any way to check his sugar and he is concerned about this. I advised patient that I would send a message to the provider and someone from the office would call him in regards to this. ?

## 2022-03-02 NOTE — Telephone Encounter (Signed)
Glucometer and all supplies sent ?

## 2022-03-05 ENCOUNTER — Ambulatory Visit (INDEPENDENT_AMBULATORY_CARE_PROVIDER_SITE_OTHER): Payer: Medicare Other | Admitting: *Deleted

## 2022-03-05 DIAGNOSIS — Z0000 Encounter for general adult medical examination without abnormal findings: Secondary | ICD-10-CM

## 2022-03-05 NOTE — Progress Notes (Signed)
? ?Subjective:  ? [name] is a 56 y.o. male who presents for Medicare Annual/Subsequent preventive examination. ? ?I connected with  [name] on 03/05/22 by a telephone enabled telemedicine application and verified that I am speaking with the correct person using two identifiers. ?  ?I discussed the limitations of evaluation and management by telemedicine. The patient expressed understanding and agreed to proceed. ? ?Patient location: home ? ?Provider location: Tele-Health not in office ? ? ? ?Review of Systems    ? ?Cardiac Risk Factors include: advanced age (>62men, >71 women);diabetes mellitus;male gender;obesity (BMI >30kg/m2) ? ?   ?Objective:  ?  ?Today's Vitals  ? ?There is no height or weight on file to calculate BMI. ? ?Advanced Directives 03/05/2022 01/09/2022 01/08/2022 08/28/2021 05/15/2021 05/08/2021 05/02/2021  ?Does Patient Have a Medical Advance Directive? No - No No No Unable to assess, patient is non-responsive or altered mental status No  ?Would patient like information on creating a medical advance directive? No - Patient declined No - Patient declined - No - Patient declined No - Patient declined - No - Patient declined  ? ? ?Current Medications (verified) ?Outpatient Encounter Medications as of 03/05/2022  ?Medication Sig  ? Accu-Chek Softclix Lancets lancets Use to check blood sugars 2-3 times daily with goals = <130 fasting in morning and <180 two hours after eating.  Bring blood sugar log to appointments.  ? albuterol (PROVENTIL HFA;VENTOLIN HFA) 108 (90 Base) MCG/ACT inhaler Inhale 2 puffs into the lungs every 4 (four) hours as needed for wheezing or shortness of breath.  ? allopurinol (ZYLOPRIM) 100 MG tablet Take 100 mg by mouth daily.  ? ANORO ELLIPTA 62.5-25 MCG/INH AEPB INHALE 1 PUFF INTO THE LUNGS DAILY.  ? aspirin EC 81 MG tablet Take 81 mg by mouth in the morning. Swallow whole.  ? atorvastatin (LIPITOR) 40 MG tablet Take 1 tablet (40 mg total) by mouth at bedtime.  ?  Blood Glucose Monitoring Suppl (ACCU-CHEK GUIDE ME) w/Device KIT Use to check blood sugars 2-3 times daily with goals = <130 fasting in morning and <180 two hours after eating.  Bring blood sugar log to appointments.  ? calcium acetate (PHOSLO) 667 MG capsule Take 667 mg by mouth 3 (three) times daily with meals.  ? Cholecalciferol (D3-1000) 25 MCG (1000 UT) tablet Take 2 tablets (2,000 Units total) by mouth daily.  ? clopidogrel (PLAVIX) 75 MG tablet Take 1 tablet (75 mg total) by mouth daily with breakfast.  ? docusate sodium (COLACE) 100 MG capsule Take 1 capsule (100 mg total) by mouth daily.  ? gabapentin (NEURONTIN) 300 MG capsule Take 300 mg by mouth at bedtime.  ? glimepiride (AMARYL) 4 MG tablet Take 4 mg by mouth daily with breakfast.  ? glucose blood test strip Use to check blood sugars 2-3 times daily with goals = <130 fasting in morning and <180 two hours after eating.  Bring blood sugar log to appointments.  ? insulin degludec (TRESIBA FLEXTOUCH) 100 UNIT/ML FlexTouch Pen Inject 20 Units into the skin at bedtime.  ? isosorbide mononitrate (IMDUR) 30 MG 24 hr tablet Take 0.5 tablets (15 mg total) by mouth daily.  ? metoprolol succinate (TOPROL-XL) 50 MG 24 hr tablet Take 1 tablet (50 mg total) by mouth daily.  ? Multiple Vitamins-Minerals (TAB-A-VITE) TABS TAKE 1 TABLET BY MOUTH ONCE DAILY.  ? ondansetron (ZOFRAN) 4 MG tablet Take 1 tablet (4 mg total) by mouth every 8 (eight) hours as needed for nausea or vomiting.  ?  OXYGEN Inhale 3 L into the lungs daily.  ? pantoprazole (PROTONIX) 40 MG tablet Take 1 tablet (40 mg total) by mouth daily.  ? Risankizumab-rzaa (SKYRIZI) 150 MG/ML SOSY Inject 150 mg into the skin as directed. Every 12 weeks for maintenance.  ? Roflumilast (ZORYVE) 0.3 % CREA Apply 1 application topically daily. To the more severe areas  ? sacubitril-valsartan (ENTRESTO) 49-51 MG Take 1 tablet by mouth 2 (two) times daily. STOP taking your Losartan and START taking Entresto.  ? silodosin  (RAPAFLO) 4 MG CAPS capsule TAKE 1 CAPSULE ONCE DAILY WITH BREAKFAST  ? Suvorexant (BELSOMRA) 10 MG TABS Take 10 mg by mouth at bedtime as needed.  ? tiZANidine (ZANAFLEX) 2 MG tablet Take 1 tablet (2 mg total) by mouth daily as needed for muscle spasms.  ? torsemide 40 MG TABS Take TWO tablets (80 mg) TWICE daily only on days you DO NOT have dialysis: Tuesday, Thursday, Saturday and Sunday  ? triamcinolone (KENALOG) 0.025 % cream Apply 1 application topically daily in the afternoon.  ? TRULICITY 3 TU/8.8KC SOPN Inject 3 mg into the skin every Thursday.  ? ?No facility-administered encounter medications on file as of 03/05/2022.  ? ? ?Allergies (verified) ?Shellfish allergy, Betadine [povidone iodine], and Povidone-iodine  ? ?History: ?Past Medical History:  ?Diagnosis Date  ? Anemia of chronic disease   ? Bell's palsy   ? Cataract   ? Chronic combined systolic (congestive) and diastolic (congestive) heart failure (HCC)   ? Chronic respiratory failure with hypoxia (HCC)   ? 3 lpm continuous  ? COPD (chronic obstructive pulmonary disease) ([place])   ? Coronary artery disease   ? Diabetes mellitus without complication ([facility])   ? Dyspnea   ? Wheezing  ? ESRD on hemodialysis ([facility])   ? GERD (gastroesophageal reflux disease)   ? Gout   ? HOH (hard of hearing)   ? mild  ? Hypercholesterolemia   ? Hypertension   ? NSVT (nonsustained ventricular tachycardia)   ? Obstructive sleep apnea   ? CPAP, O2 use continuosly at 3LPM  ? Orthopnea   ? Pneumonia   ? In Past X2 most recent 1 yr ago  ? Psoriasis   ? Pulmonary HTN ([place])   ? Ventricular fibrillation ([place])   ? ?Past Surgical History:  ?Procedure Laterality Date  ? A/V FISTULAGRAM Right 05/02/2021  ? Procedure: A/V FISTULAGRAM;  Surgeon: Katha Cabal, MD;  Location: [facility];  Service: Cardiovascular;  Laterality: Right;  ? AV FISTULA PLACEMENT Right 11/20/2019  ? Procedure: ARTERIOVENOUS (AV) FISTULA CREATION ( BRACHIAL CEPHALIC );  Surgeon: Katha Cabal, MD;   Location: [facility];  Service: Vascular;  Laterality: Right;  ? CATARACT EXTRACTION W/PHACO Left 12/17/2019  ? Procedure: CATARACT EXTRACTION PHACO AND INTRAOCULAR LENS PLACEMENT ([place]) LEFT ISTENT INJ DIABETIC;  Surgeon: Eulogio Bear, MD;  Location: [facility];  Service: Ophthalmology;  Laterality: Left;  [place] 00:33.2 ?CDE 2.96 ?Fluid Pack Lot # [id number]  ? CATARACT EXTRACTION W/PHACO Right 02/24/2020  ? Procedure: CATARACT EXTRACTION PHACO AND INTRAOCULAR LENS PLACEMENT ([facility]) RIGHT DIABETIC ISTENT INJ;  Surgeon: Eulogio Bear, MD;  Location: [facility];  Service: Ophthalmology;  Laterality: Right;  [place] 00:37.3 ?CDE 2.88 ?Fluid Pack Lot # [id number]  ? CORONARY ANGIOPLASTY WITH STENT PLACEMENT    ? stent placement  ? CORONARY STENT INTERVENTION N/A 08/28/2021  ? Procedure: CORONARY STENT INTERVENTION;  Surgeon: Nelva Bush, MD;  Location: [facility];  Service: Cardiovascular;  Laterality: N/A;  ?  DIALYSIS/PERMA CATHETER INSERTION N/A 06/22/2019  ? Procedure: DIALYSIS/PERMA CATHETER INSERTION;  Surgeon: Algernon Huxley, MD;  Location: [facility];  Service: Cardiovascular;  Laterality: N/A;  ? DIALYSIS/PERMA CATHETER REMOVAL N/A 05/31/2020  ? Procedure: DIALYSIS/PERMA CATHETER REMOVAL;  Surgeon: Katha Cabal, MD;  Location: [facility];  Service: Cardiovascular;  Laterality: N/A;  ? INSERTION OF AHMED VALVE Left 12/17/2019  ? Procedure: INSERTION OF iSTENT;  Surgeon: Eulogio Bear, MD;  Location: [facility];  Service: Ophthalmology;  Laterality: Left;  ? INTRAVASCULAR IMAGING/OCT N/A 08/28/2021  ? Procedure: INTRAVASCULAR IMAGING/OCT;  Surgeon: Nelva Bush, MD;  Location: [facility];  Service: Cardiovascular;  Laterality: N/A;  ? LEFT HEART CATH AND CORONARY ANGIOGRAPHY N/A 08/08/2021  ? Procedure: LEFT HEART CATH AND CORONARY ANGIOGRAPHY;  Surgeon: Nelva Bush, MD;  Location: [facility];  Service: Cardiovascular;  Laterality: N/A;  ? ?Family History   ?Problem Relation Age of Onset  ? Heart failure Mother   ? Kidney failure Brother   ? ?Social History  ? ?Socioeconomic History  ? Marital status: Single  ?  Spouse name: Not on file  ? Number of children:

## 2022-03-05 NOTE — Patient Instructions (Signed)
[name] , ?Thank you for taking time to come for your Medicare Wellness Visit. I appreciate your ongoing commitment to your health goals. Please review the following plan we discussed and let me know if I can assist you in the future.  ?  ?Screening recommendations/referrals: ?Colonoscopy: up to date ?Recommended yearly ophthalmology/optometry visit for glaucoma screening and checkup ?Recommended yearly dental visit for hygiene and checkup ? ?Vaccinations: ?Influenza vaccine: up to date ?Pneumococcal vaccine: up to date ?Tdap vaccine: up to date ?Shingles vaccine: Education provided   ? ?Advanced directives: Education provided ? ?Conditions/risks identified:  ? ?Next appointment: 04-24-2022 @ 10:20 Cannady ? ?Preventive Care 40-64 Years, Male ?Preventive care refers to lifestyle choices and visits with your health care provider that can promote health and wellness. ?What does preventive care include? ?A yearly physical exam. This is also called an annual well check. ?Dental exams once or twice a year. ?Routine eye exams. Ask your health care provider how often you should have your eyes checked. ?Personal lifestyle choices, including: ?Daily care of your teeth and gums. ?Regular physical activity. ?Eating a healthy diet. ?Avoiding tobacco and drug use. ?Limiting alcohol use. ?Practicing safe sex. ?Taking low-dose aspirin every day starting at age 25. ?What happens during an annual well check? ?The services and screenings done by your health care provider during your annual well check will depend on your age, overall health, lifestyle risk factors, and family history of disease. ?Counseling  ?Your health care provider may ask you questions about your: ?Alcohol use. ?Tobacco use. ?Drug use. ?Emotional well-being. ?Home and relationship well-being. ?Sexual activity. ?Eating habits. ?Work and work Statistician. ?Screening  ?You may have the following tests or measurements: ?Height, weight, and BMI. ?Blood  pressure. ?Lipid and cholesterol levels. These may be checked every 5 years, or more frequently if you are over 59 years old. ?Skin check. ?Lung cancer screening. You may have this screening every year starting at age 103 if you have a 30-pack-year history of smoking and currently smoke or have quit within the past 15 years. ?Fecal occult blood test (FOBT) of the stool. You may have this test every year starting at age 77. ?Flexible sigmoidoscopy or colonoscopy. You may have a sigmoidoscopy every 5 years or a colonoscopy every 10 years starting at age 71. ?Prostate cancer screening. Recommendations will vary depending on your family history and other risks. ?Hepatitis C blood test. ?Hepatitis B blood test. ?Sexually transmitted disease (STD) testing. ?Diabetes screening. This is done by checking your blood sugar (glucose) after you have not eaten for a while (fasting). You may have this done every 1-3 years. ?Discuss your test results, treatment options, and if necessary, the need for more tests with your health care provider. ?Vaccines  ?Your health care provider may recommend certain vaccines, such as: ?Influenza vaccine. This is recommended every year. ?Tetanus, diphtheria, and acellular pertussis (Tdap, Td) vaccine. You may need a Td booster every 10 years. ?Zoster vaccine. You may need this after age 10. ?Pneumococcal 13-valent conjugate (PCV13) vaccine. You may need this if you have certain conditions and have not been vaccinated. ?Pneumococcal polysaccharide (PPSV23) vaccine. You may need one or two doses if you smoke cigarettes or if you have certain conditions. ?Talk to your health care provider about which screenings and vaccines you need and how often you need them. ?This information is not intended to replace advice given to you by your health care provider. Make sure you discuss any questions you have with your health care  provider. ?Document Released: 12/30/2015 Document Revised: 08/22/2016 Document  Reviewed: 10/04/2015 ?Elsevier Interactive Patient Education ? 2017 [place]. ? ?Fall Prevention in the Home ?Falls can cause injuries. They can happen to people of all ages. There are many things you can do to make your home safe and to help prevent falls. ?What can I do on the outside of my home? ?Regularly fix the edges of walkways and driveways and fix any cracks. ?Remove anything that might make you trip as you walk through a door, such as a raised step or threshold. ?Trim any bushes or trees on the path to your home. ?Use bright outdoor lighting. ?Clear any walking paths of anything that might make someone trip, such as rocks or tools. ?Regularly check to see if handrails are loose or broken. Make sure that both sides of any steps have handrails. ?Any raised decks and porches should have guardrails on the edges. ?Have any leaves, snow, or ice cleared regularly. ?Use sand or salt on walking paths during winter. ?Clean up any spills in your garage right away. This includes oil or grease spills. ?What can I do in the bathroom? ?Use night lights. ?Install grab bars by the toilet and in the tub and shower. Do not use towel bars as grab bars. ?Use non-skid mats or decals in the tub or shower. ?If you need to sit down in the shower, use a plastic, non-slip stool. ?Keep the floor dry. Clean up any water that spills on the floor as soon as it happens. ?Remove soap buildup in the tub or shower regularly. ?Attach bath mats securely with double-sided non-slip rug tape. ?Do not have throw rugs and other things on the floor that can make you trip. ?What can I do in the bedroom? ?Use night lights. ?Make sure that you have a light by your bed that is easy to reach. ?Do not use any sheets or blankets that are too big for your bed. They should not hang down onto the floor. ?Have a firm chair that has side arms. You can use this for support while you get dressed. ?Do not have throw rugs and other things on the floor that can  make you trip. ?What can I do in the kitchen? ?Clean up any spills right away. ?Avoid walking on wet floors. ?Keep items that you use a lot in easy-to-reach places. ?If you need to reach something above you, use a strong step stool that has a grab bar. ?Keep electrical cords out of the way. ?Do not use floor polish or wax that makes floors slippery. If you must use wax, use non-skid floor wax. ?Do not have throw rugs and other things on the floor that can make you trip. ?What can I do with my stairs? ?Do not leave any items on the stairs. ?Make sure that there are handrails on both sides of the stairs and use them. Fix handrails that are broken or loose. Make sure that handrails are as long as the stairways. ?Check any carpeting to make sure that it is firmly attached to the stairs. Fix any carpet that is loose or worn. ?Avoid having throw rugs at the top or bottom of the stairs. If you do have throw rugs, attach them to the floor with carpet tape. ?Make sure that you have a light switch at the top of the stairs and the bottom of the stairs. If you do not have them, ask someone to add them for you. ?What else can I do  to help prevent falls? ?Wear shoes that: ?Do not have high heels. ?Have rubber bottoms. ?Are comfortable and fit you well. ?Are closed at the toe. Do not wear sandals. ?If you use a stepladder: ?Make sure that it is fully opened. Do not climb a closed stepladder. ?Make sure that both sides of the stepladder are locked into place. ?Ask someone to hold it for you, if possible. ?Clearly mark and make sure that you can see: ?Any grab bars or handrails. ?First and last steps. ?Where the edge of each step is. ?Use tools that help you move around (mobility aids) if they are needed. These include: ?Canes. ?Walkers. ?Scooters. ?Crutches. ?Turn on the lights when you go into a dark area. Replace any light bulbs as soon as they burn out. ?Set up your furniture so you have a clear path. Avoid moving your furniture  around. ?If any of your floors are uneven, fix them. ?If there are any pets around you, be aware of where they are. ?Review your medicines with your doctor. Some medicines can make you feel dizzy. This can increase

## 2022-03-06 ENCOUNTER — Telehealth (HOSPITAL_COMMUNITY): Payer: Self-pay | Admitting: *Deleted

## 2022-03-06 ENCOUNTER — Ambulatory Visit
Admission: RE | Admit: 2022-03-06 | Discharge: 2022-03-06 | Disposition: A | Discharge status: Home / Self Care | Payer: Medicare Other | Source: Ambulatory Visit | Attending: Gastroenterology | Admitting: Gastroenterology

## 2022-03-06 ENCOUNTER — Encounter
Admission: RE | Disposition: A | Discharge status: Home / Self Care | Payer: Self-pay | Source: Ambulatory Visit | Attending: Gastroenterology

## 2022-03-06 ENCOUNTER — Ambulatory Visit: Payer: Medicare Other | Admitting: Anesthesiology

## 2022-03-06 ENCOUNTER — Encounter: Payer: Self-pay | Admitting: Gastroenterology

## 2022-03-06 DIAGNOSIS — N186 End stage renal disease: Secondary | ICD-10-CM | POA: Insufficient documentation

## 2022-03-06 DIAGNOSIS — I472 Ventricular tachycardia, unspecified: Secondary | ICD-10-CM | POA: Insufficient documentation

## 2022-03-06 DIAGNOSIS — J449 Chronic obstructive pulmonary disease, unspecified: Secondary | ICD-10-CM | POA: Diagnosis not present

## 2022-03-06 DIAGNOSIS — K635 Polyp of colon: Secondary | ICD-10-CM | POA: Diagnosis not present

## 2022-03-06 DIAGNOSIS — E1165 Type 2 diabetes mellitus with hyperglycemia: Secondary | ICD-10-CM | POA: Insufficient documentation

## 2022-03-06 DIAGNOSIS — D122 Benign neoplasm of ascending colon: Secondary | ICD-10-CM | POA: Insufficient documentation

## 2022-03-06 DIAGNOSIS — I5042 Chronic combined systolic (congestive) and diastolic (congestive) heart failure: Secondary | ICD-10-CM | POA: Diagnosis not present

## 2022-03-06 DIAGNOSIS — E7800 Pure hypercholesterolemia, unspecified: Secondary | ICD-10-CM | POA: Insufficient documentation

## 2022-03-06 DIAGNOSIS — I272 Pulmonary hypertension, unspecified: Secondary | ICD-10-CM | POA: Insufficient documentation

## 2022-03-06 DIAGNOSIS — Z1211 Encounter for screening for malignant neoplasm of colon: Secondary | ICD-10-CM | POA: Diagnosis present

## 2022-03-06 DIAGNOSIS — K219 Gastro-esophageal reflux disease without esophagitis: Secondary | ICD-10-CM | POA: Diagnosis not present

## 2022-03-06 DIAGNOSIS — L409 Psoriasis, unspecified: Secondary | ICD-10-CM | POA: Diagnosis not present

## 2022-03-06 DIAGNOSIS — E1122 Type 2 diabetes mellitus with diabetic chronic kidney disease: Secondary | ICD-10-CM | POA: Insufficient documentation

## 2022-03-06 DIAGNOSIS — Z992 Dependence on renal dialysis: Secondary | ICD-10-CM | POA: Diagnosis not present

## 2022-03-06 DIAGNOSIS — K5140 Inflammatory polyps of colon without complications: Secondary | ICD-10-CM | POA: Diagnosis not present

## 2022-03-06 DIAGNOSIS — I2510 Atherosclerotic heart disease of native coronary artery without angina pectoris: Secondary | ICD-10-CM | POA: Diagnosis not present

## 2022-03-06 DIAGNOSIS — Z6841 Body Mass Index (BMI) 40.0 and over, adult: Secondary | ICD-10-CM | POA: Diagnosis not present

## 2022-03-06 DIAGNOSIS — I132 Hypertensive heart and chronic kidney disease with heart failure and with stage 5 chronic kidney disease, or end stage renal disease: Secondary | ICD-10-CM | POA: Diagnosis not present

## 2022-03-06 DIAGNOSIS — Z9981 Dependence on supplemental oxygen: Secondary | ICD-10-CM | POA: Insufficient documentation

## 2022-03-06 DIAGNOSIS — M109 Gout, unspecified: Secondary | ICD-10-CM | POA: Diagnosis not present

## 2022-03-06 DIAGNOSIS — Z955 Presence of coronary angioplasty implant and graft: Secondary | ICD-10-CM | POA: Insufficient documentation

## 2022-03-06 DIAGNOSIS — G4733 Obstructive sleep apnea (adult) (pediatric): Secondary | ICD-10-CM | POA: Insufficient documentation

## 2022-03-06 DIAGNOSIS — J9611 Chronic respiratory failure with hypoxia: Secondary | ICD-10-CM | POA: Diagnosis not present

## 2022-03-06 HISTORY — PX: COLONOSCOPY WITH PROPOFOL: SHX5780

## 2022-03-06 LAB — GLUCOSE, CAPILLARY: Glucose-Capillary: 93 mg/dL (ref 70–99)

## 2022-03-06 SURGERY — COLONOSCOPY WITH PROPOFOL
Anesthesia: General

## 2022-03-06 MED ORDER — SODIUM CHLORIDE 0.9 % IV SOLN: INTRAVENOUS | Status: DC

## 2022-03-06 MED ORDER — LIDOCAINE HCL (PF) 1 % IJ SOLN
INTRAMUSCULAR | Status: AC
  Filled 2022-03-06: qty 2

## 2022-03-06 MED ORDER — LIDOCAINE HCL (CARDIAC) PF 100 MG/5ML IV SOSY
PREFILLED_SYRINGE | INTRAVENOUS | Status: DC | PRN
  Administered 2022-03-06: 100 mg via INTRAVENOUS

## 2022-03-06 MED ORDER — PHENYLEPHRINE HCL (PRESSORS) 10 MG/ML IV SOLN
INTRAVENOUS | Status: DC | PRN
  Administered 2022-03-06: 80 ug via INTRAVENOUS
  Administered 2022-03-06 (×2): 120 ug via INTRAVENOUS
  Administered 2022-03-06: 80 ug via INTRAVENOUS

## 2022-03-06 MED ORDER — GLYCOPYRROLATE 0.2 MG/ML IJ SOLN
INTRAMUSCULAR | Status: DC | PRN
  Administered 2022-03-06: .2 mg via INTRAVENOUS

## 2022-03-06 MED ORDER — DEXMEDETOMIDINE (PRECEDEX) IN NS 20 MCG/5ML (4 MCG/ML) IV SYRINGE
PREFILLED_SYRINGE | INTRAVENOUS | Status: DC | PRN
  Administered 2022-03-06: 12 ug via INTRAVENOUS

## 2022-03-06 MED ORDER — PROPOFOL 10 MG/ML IV BOLUS
INTRAVENOUS | Status: DC | PRN
  Administered 2022-03-06: 60 mg via INTRAVENOUS

## 2022-03-06 MED ORDER — PROPOFOL 500 MG/50ML IV EMUL
INTRAVENOUS | Status: DC | PRN
  Administered 2022-03-06: 120 ug/kg/min via INTRAVENOUS

## 2022-03-06 MED ORDER — PROPOFOL 500 MG/50ML IV EMUL
INTRAVENOUS | Status: AC
  Filled 2022-03-06: qty 200

## 2022-03-06 NOTE — H&P (Signed)
? ? ? ?Jonathon Bellows, MD ?[address], [place], [place], [place], [address] ?[address], [place], [place], [place], [address] ?Phone: [phone]  ?Fax: [phone] ? ?Primary Care Physician:  Venita Lick, NP ? ? ?Pre-Procedure History & Physical: ?HPI:  [name] is a 56 y.o. male is here for an colonoscopy. ?  ?Past Medical History:  ?Diagnosis Date  ? Anemia of chronic disease   ? Bell's palsy   ? Cataract   ? Chronic combined systolic (congestive) and diastolic (congestive) heart failure (HCC)   ? Chronic respiratory failure with hypoxia (HCC)   ? 3 lpm continuous  ? COPD (chronic obstructive pulmonary disease) ([place])   ? Coronary artery disease   ? Diabetes mellitus without complication ([place])   ? Dyspnea   ? Wheezing  ? ESRD on hemodialysis ([place])   ? GERD (gastroesophageal reflux disease)   ? Gout   ? HOH (hard of hearing)   ? mild  ? Hypercholesterolemia   ? Hypertension   ? NSVT (nonsustained ventricular tachycardia)   ? Obstructive sleep apnea   ? CPAP, O2 use continuosly at 3LPM  ? Orthopnea   ? Pneumonia   ? In Past X2 most recent 1 yr ago  ? Psoriasis   ? Pulmonary HTN ([place])   ? Ventricular fibrillation ([place])   ? ? ?Past Surgical History:  ?Procedure Laterality Date  ? A/V FISTULAGRAM Right 05/02/2021  ? Procedure: A/V FISTULAGRAM;  Surgeon: Katha Cabal, MD;  Location: [facility];  Service: Cardiovascular;  Laterality: Right;  ? AV FISTULA PLACEMENT Right 11/20/2019  ? Procedure: ARTERIOVENOUS (AV) FISTULA CREATION ( BRACHIAL CEPHALIC );  Surgeon: Katha Cabal, MD;  Location: [facility];  Service: Vascular;  Laterality: Right;  ? CATARACT EXTRACTION W/PHACO Left 12/17/2019  ? Procedure: CATARACT EXTRACTION PHACO AND INTRAOCULAR LENS PLACEMENT ([place]) LEFT ISTENT INJ DIABETIC;  Surgeon: Eulogio Bear, MD;  Location: [facility];  Service: Ophthalmology;  Laterality: Left;  [place] 00:33.2 ?CDE 2.96 ?Fluid Pack Lot # [id number]  ? CATARACT EXTRACTION W/PHACO Right  02/24/2020  ? Procedure: CATARACT EXTRACTION PHACO AND INTRAOCULAR LENS PLACEMENT ([facility]) RIGHT DIABETIC ISTENT INJ;  Surgeon: Eulogio Bear, MD;  Location: [facility];  Service: Ophthalmology;  Laterality: Right;  [place] 00:37.3 ?CDE 2.88 ?Fluid Pack Lot # [id number]  ? CORONARY ANGIOPLASTY WITH STENT PLACEMENT    ? stent placement  ? CORONARY STENT INTERVENTION N/A 08/28/2021  ? Procedure: CORONARY STENT INTERVENTION;  Surgeon: Nelva Bush, MD;  Location: [facility];  Service: Cardiovascular;  Laterality: N/A;  ? DIALYSIS/PERMA CATHETER INSERTION N/A 06/22/2019  ? Procedure: DIALYSIS/PERMA CATHETER INSERTION;  Surgeon: Algernon Huxley, MD;  Location: [facility];  Service: Cardiovascular;  Laterality: N/A;  ? DIALYSIS/PERMA CATHETER REMOVAL N/A 05/31/2020  ? Procedure: DIALYSIS/PERMA CATHETER REMOVAL;  Surgeon: Katha Cabal, MD;  Location: [facility];  Service: Cardiovascular;  Laterality: N/A;  ? EYE SURGERY    ? INSERTION OF AHMED VALVE Left 12/17/2019  ? Procedure: INSERTION OF iSTENT;  Surgeon: Eulogio Bear, MD;  Location: [facility];  Service: Ophthalmology;  Laterality: Left;  ? INTRAVASCULAR IMAGING/OCT N/A 08/28/2021  ? Procedure: INTRAVASCULAR IMAGING/OCT;  Surgeon: Nelva Bush, MD;  Location: [facility];  Service: Cardiovascular;  Laterality: N/A;  ? LEFT HEART CATH AND CORONARY ANGIOGRAPHY N/A 08/08/2021  ? Procedure: LEFT HEART CATH AND CORONARY ANGIOGRAPHY;  Surgeon: Nelva Bush, MD;  Location: [facility];  Service: Cardiovascular;  Laterality: N/A;  ? ? ?Prior to Admission medications   ?Medication Sig Start Date End Date Taking? Authorizing Provider  ?Accu-Chek Softclix Lancets lancets Use to check blood sugars 2-3 times daily with goals = <130 fasting in morning and <180 two hours after eating.  Bring blood sugar log to appointments. 03/02/22  Yes Cannady, Henrine Screws T, NP  ?allopurinol (ZYLOPRIM) 100 MG tablet Take 100 mg by mouth daily.  07/21/21  Yes [provider]  ?Celedonio Miyamoto 62.5-25 MCG/INH AEPB INHALE 1 PUFF INTO THE LUNGS DAILY. 11/30/19  Yes Kendell Bane, NP  ?aspirin EC 81 MG tablet Take 81 mg by mouth in the morning. Swallow whole.   Yes [provider]  ?atorvastatin (LIPITOR) 40 MG tablet Take 1 tablet (40 mg total) by mouth at bedtime. 08/17/21  Yes Rise Mu, PA-C  ?Blood Glucose Monitoring Suppl (ACCU-CHEK GUIDE ME) w/Device KIT Use to check blood sugars 2-3 times daily with goals = <130 fasting in morning and <180 two hours after eating.  Bring blood sugar log to appointments. 03/02/22  Yes Cannady, Henrine Screws T, NP  ?calcium acetate (PHOSLO) 667 MG capsule Take 667 mg by mouth 3 (three) times daily with meals. 01/17/21  Yes [provider]  ?Cholecalciferol (D3-1000) 25 MCG (1000 UT) tablet Take 2 tablets (2,000 Units total) by mouth daily. 01/20/19  Yes Doles-Johnson, Teah, NP  ?docusate sodium (COLACE) 100 MG capsule Take 1 capsule (100 mg total) by mouth daily. 09/05/21  Yes Dunn, Areta Haber, PA-C  ?gabapentin (NEURONTIN) 300 MG capsule Take 300 mg by mouth at bedtime. 06/15/21  Yes [provider]  ?glimepiride (AMARYL) 4 MG tablet Take 4 mg by mouth daily with breakfast. 11/27/19  Yes [provider]  ?glucose blood test strip Use to check blood sugars 2-3 times daily with goals = <130 fasting in morning and <180 two hours after eating.  Bring blood sugar log to appointments. 03/02/22  Yes Cannady, Jolene T, NP  ?insulin degludec (TRESIBA FLEXTOUCH) 100 UNIT/ML FlexTouch Pen Inject 20 Units into the skin at bedtime. 08/30/20  Yes Lavera Guise, MD  ?isosorbide mononitrate (IMDUR) 30 MG 24 hr tablet Take 0.5 tablets (15 mg total) by mouth daily. 01/30/22 04/30/22 Yes Agbor-Etang, Aaron Edelman, MD  ?metoprolol succinate (TOPROL-XL) 50 MG 24 hr tablet Take 1 tablet (50 mg total) by mouth daily. 01/30/22  Yes Kate Sable, MD  ?Multiple Vitamins-Minerals (TAB-A-VITE) TABS TAKE 1 TABLET BY MOUTH ONCE  DAILY. 02/19/20  Yes Kendell Bane, NP  ?OXYGEN Inhale 3 L into the lungs daily.   Yes [provider]  ?pantoprazole (PROTONIX) 40 MG tablet Take 1 tablet (40 mg total) by mouth daily. 02/23/22  Yes Agbor-Etang, Aaron Edelman, MD  ?Roflumilast (ZORYVE) 0.3 % CREA Apply 1 application topically daily. To the more severe areas 02/13/22  Yes Ralene Bathe, MD  ?sacubitril-valsartan (ENTRESTO) 49-51 MG Take 1 tablet by mouth 2 (two) times daily. STOP taking your Losartan and START taking Entresto. 10/13/21  Yes Agbor-Etang, Aaron Edelman, MD  ?silodosin (RAPAFLO) 4 MG CAPS capsule TAKE 1 CAPSULE ONCE DAILY WITH BREAKFAST 01/18/22  Yes Stoioff, Ronda Fairly, MD  ?Suvorexant (BELSOMRA) 10 MG TABS Take 10 mg by mouth at bedtime as needed. 02/20/22  Yes Cannady, Jolene T, NP  ?tiZANidine (ZANAFLEX) 2 MG tablet Take 1 tablet (2 mg total) by mouth daily as needed for muscle spasms. 01/23/22  Yes Cannady, Jolene T, NP  ?torsemide 40 MG TABS Take TWO tablets (80 mg)  TWICE daily only on days you DO NOT have dialysis: Tuesday, Thursday, Saturday and Sunday 10/24/21  Yes Agbor-Etang, Aaron Edelman, MD  ?triamcinolone (KENALOG) 0.025 % cream Apply 1 application topically daily in the afternoon.   Yes [provider]  ?TRULICITY 3 NG/7.6BO SOPN Inject 3 mg into the skin every Thursday. 07/21/21  Yes [provider]  ?albuterol (PROVENTIL HFA;VENTOLIN HFA) 108 (90 Base) MCG/ACT inhaler Inhale 2 puffs into the lungs every 4 (four) hours as needed for wheezing or shortness of breath. 01/20/19   Doles-Johnson, Teah, NP  ?clopidogrel (PLAVIX) 75 MG tablet Take 1 tablet (75 mg total) by mouth daily with breakfast. 01/26/22   Kate Sable, MD  ?ondansetron (ZOFRAN) 4 MG tablet Take 1 tablet (4 mg total) by mouth every 8 (eight) hours as needed for nausea or vomiting. 02/20/22   Venita Lick, NP  ?Risankizumab-rzaa (SKYRIZI) 150 MG/ML SOSY Inject 150 mg into the skin as directed. Every 12 weeks for maintenance. 01/30/22   Ralene Bathe, MD   ? ? ?Allergies as of 01/31/2022 - Review Complete 01/31/2022  ?Allergen Reaction Noted  ? Shellfish allergy Anaphylaxis 08/26/2018  ? Betadine [povidone iodine] Rash 12/04/2019  ? Povidone-iodine Rash 12/04/2019

## 2022-03-06 NOTE — Transfer of Care (Signed)
Immediate Anesthesia Transfer of Care Note ? ?Patient: [name] ? ?Procedure(s) Performed: COLONOSCOPY WITH PROPOFOL ? ?Patient Location: PACU ? ?Anesthesia Type:General ? ?Level of Consciousness: awake, alert  and oriented ? ?Airway & Oxygen Therapy: Patient Spontanous Breathing ? ?Post-op Assessment: Report given to RN and Post -op Vital signs reviewed and stable ? ?Post vital signs: Reviewed and stable ? ?Last Vitals:  ?Vitals Value Taken Time  ?BP 123/59 03/06/22 0841  ?Temp 36.8 ?C 03/06/22 0841  ?Pulse 102 03/06/22 0841  ?Resp 15 03/06/22 0841  ?SpO2 99 % 03/06/22 0841  ?Vitals shown include unvalidated device data. ? ?Last Pain:  ?Vitals:  ? 03/06/22 0841  ?TempSrc: Temporal  ?PainSc: Asleep  ?   ? ?  ? ?Complications: No notable events documented. ?

## 2022-03-06 NOTE — Telephone Encounter (Signed)
Call the [facility] that patient goes to coordinate dialysis care for before and after patient's upcoming cardiac MRI.  Left message with staff for coordinator to call back. ? ?Gordy Clement RN Navigator Cardiac Imaging ? Heart and Vascular Services ?[phone] Office ?[phone] Cell ? ?

## 2022-03-06 NOTE — Anesthesia Postprocedure Evaluation (Signed)
Anesthesia Post Note ? ?Patient: [name] ? ?Procedure(s) Performed: COLONOSCOPY WITH PROPOFOL ? ?Patient location during evaluation: Endoscopy ?Anesthesia Type: General ?Level of consciousness: awake and alert ?Pain management: pain level controlled ?Vital Signs Assessment: post-procedure vital signs reviewed and stable ?Respiratory status: spontaneous breathing, nonlabored ventilation and respiratory function stable ?Cardiovascular status: blood pressure returned to baseline and stable ?Postop Assessment: no apparent nausea or vomiting ?Anesthetic complications: no ? ? ?No notable events documented. ? ? ?Last Vitals:  ?Vitals:  ? 03/06/22 0851 03/06/22 0859  ?BP: 136/65 (!) 157/76  ?Pulse:    ?Resp: 20   ?Temp:    ?SpO2:    ?  ?Last Pain:  ?Vitals:  ? 03/06/22 0901  ?TempSrc:   ?PainSc: 0-No pain  ? ? ?  ?  ?  ?  ?  ?  ? ?[place] Ouch ? ? ? ? ?

## 2022-03-06 NOTE — Op Note (Signed)
[facility] ?Gastroenterology ?Patient Name: [name] ?Procedure Date: 03/06/2022 7:17 AM ?MRN: [id number] ?Account #: [id number] ?Date of Birth: 1965-12-27 ?Admit Type: Outpatient ?Age: 56 ?Room: [facility] ENDO ROOM 2 ?Gender: Male ?Note Status: Finalized ?Instrument Name: Colonoscope [id number] ?Procedure:             Colonoscopy ?Indications:           Screening for colorectal malignant neoplasm ?Providers:             Jonathon Bellows MD, MD ?Referring MD:          Barbaraann Faster. Ned Card (Referring MD) ?Medicines:             Monitored Anesthesia Care ?Complications:         No immediate complications. ?Procedure:             Pre-Anesthesia Assessment: ?                       - Prior to the procedure, a History and Physical was  ?                       performed, and patient medications, allergies and  ?                       sensitivities were reviewed. The patient's tolerance  ?                       of previous anesthesia was reviewed. ?                       - The risks and benefits of the procedure and the  ?                       sedation options and risks were discussed with the  ?                       patient. All questions were answered and informed  ?                       consent was obtained. ?                       - ASA Grade Assessment: III - A patient with severe  ?                       systemic disease. ?                       After obtaining informed consent, the colonoscope was  ?                       passed under direct vision. Throughout the procedure,  ?                       the patient's blood pressure, pulse, and oxygen  ?                       saturations were monitored continuously. The  ?                       Colonoscope was introduced  through the anus and  ?                       advanced to the the cecum, identified by the  ?                       appendiceal orifice. The colonoscopy was performed  ?                       with ease. The patient tolerated the procedure  well.  ?                       The quality of the bowel preparation was good. ?Findings: ?     The perianal and digital rectal examinations were normal. ?     A 5 mm polyp was found in the ascending colon. The polyp was sessile.  ?     The polyp was removed with a cold snare. Resection and retrieval were  ?     complete. ?     A 10 mm polyp was found in the descending colon. The polyp was  ?     semi-pedunculated. The polyp was removed with a hot snare. Resection and  ?     retrieval were complete. ?     The exam was otherwise without abnormality on direct and retroflexion  ?     views. ?Impression:            - One 5 mm polyp in the ascending colon, removed with  ?                       a cold snare. Resected and retrieved. ?                       - One 10 mm polyp in the descending colon, removed  ?                       with a hot snare. Resected and retrieved. ?                       - The examination was otherwise normal on direct and  ?                       retroflexion views. ?Recommendation:        - Discharge patient to home (with escort). ?                       - Resume previous diet. ?                       - Continue present medications. ?                       - Await pathology results. ?                       - Repeat colonoscopy for surveillance based on  ?                       pathology results. ?Procedure Code(s):     --- Professional --- ?  45385, Colonoscopy, flexible; with removal of  ?                       tumor(s), polyp(s), or other lesion(s) by snare  ?                       technique ?Diagnosis Code(s):     --- Professional --- ?                       K63.5, Polyp of colon ?                       Z12.11, Encounter for screening for malignant neoplasm  ?                       of colon ?CPT copyright 2019 [employer]. All rights reserved. ?The codes documented in this report are preliminary and upon coder review may  ?be revised to meet current  compliance requirements. ?Jonathon Bellows, MD ?Jonathon Bellows MD, MD ?03/06/2022 8:38:41 AM ?This report has been signed electronically. ?Number of Addenda: 0 ?Note Initiated On: 03/06/2022 7:17 AM ?Scope Withdrawal Time: 0 hours 12 minutes 39 seconds  ?Total Procedure Duration: 0 hours 16 minutes 11 seconds  ?Estimated Blood Loss:  Estimated blood loss: none. ?     [facility] ?

## 2022-03-06 NOTE — Anesthesia Preprocedure Evaluation (Addendum)
Anesthesia Evaluation  ?Patient identified by MRN, date of birth, ID band ?Patient awake ? ? ? ?Reviewed: ?Allergy & Precautions, NPO status , Patient's Chart, lab work & pertinent test results ? ?History of Anesthesia Complications ?Negative for: history of anesthetic complications ? ?Airway ?Mallampati: IV ? ?TM Distance: >3 FB ?Neck ROM: Full ? ? ? Dental ?no notable dental hx. ? ?  ?Pulmonary ?shortness of breath and with exertion, sleep apnea and Continuous Positive Airway Pressure Ventilation , COPD,  COPD inhaler,  ?3 lpm continuous ?Chronic respiratory failure ?  ?breath sounds clear to auscultation- rhonchi ?(-) wheezing ? ? ? ? ? Cardiovascular ?Exercise Tolerance: Poor ?hypertension, + CAD, + Cardiac Stents (0017), +CHF (systolic and diastolic heart failure ) and + DOE  ?+ dysrhythmias (NSVT )  ?Rhythm:Regular Rate:Normal ?- Systolic murmurs and - Diastolic murmurs ?VF arrest 04/2021 (deemed ischemic w/ subsequent  ? ?[place] 9/22: ?1. Severe two-vessel coronary artery disease, as detailed on recent diagnostic catheterization. ?2. Moderately elevated left ventricular filling pressure (LVEDP 25-30 mmHg). ?3. Successful OCT-guided PCI to proximal LAD using Onyx Frontier 3.5 x 18 mm drug-eluting stent (post-dilated to 4.1 mm) with 0% residual stenosis and TIMI-3 flow. ?4. Successful OCT-guided PCI to mid LCx using Onyx Frontier 2.75 x 8 mm drug-eluting stent (postdilated to 3.5 mm) with 0% residual stenosis and TIMI-3 flow. ? ?  ?Neuro/Psych ?neg Seizures negative neurological ROS ? negative psych ROS  ? GI/Hepatic ?Neg liver ROS, GERD  Controlled and Medicated,  ?Endo/Other  ?diabetes (A1c 8.3%), Poorly Controlled, Insulin DependentMorbid obesity ? Renal/GU ?ESRF and DialysisRenal disease  ? ?  ?Musculoskeletal ? ?(+) Arthritis ,  ? Abdominal ?(+) + obese,   ?Peds ? Hematology ? ?(+) Blood dyscrasia, anemia , Anemia of chronic disease   ?Anesthesia Other Findings ?Past Medical  History: ?No date: Bell's palsy ?No date: Bell's palsy ?No date: Cataract ?No date: CHF (congestive heart failure) ([place]) ?No date: Chronic kidney disease ?    Comment:  dialysis ?No date: COPD (chronic obstructive pulmonary disease) ([place]) ?No date: Coronary artery disease ?No date: Diabetes mellitus without complication ([place]) ?No date: Dyspnea ?    Comment:  Wheezing ?No date: GERD (gastroesophageal reflux disease) ?No date: Gout ?No date: HOH (hard of hearing) ?    Comment:  mild ?No date: Hypercholesterolemia ?No date: Hypertension ?No date: NSVT (nonsustained ventricular tachycardia) ([place]) ?No date: Obstructive sleep apnea ?    Comment:  CPAP, O2 use continuosly at 3LPM ?No date: Orthopnea ?No date: Oxygen dependent ?    Comment:  3 lpm continuous ?No date: Pneumonia ?    Comment:  In Past X2 most recent 1 yr ago ?No date: Psoriasis ?No date: Psoriasis ?No date: Pulmonary HTN ([place]) ?No date: Renal failure ? ? Reproductive/Obstetrics ? ?  ? ? ? ? ? ? ? ? ? ? ? ? ? ?  ?  ? ? ? ? ? ? ? ?Anesthesia Physical ? ?Anesthesia Plan ? ?ASA: 3 ? ?Anesthesia Plan: General  ? ?Post-op Pain Management:   ? ?Induction: Intravenous ? ?PONV Risk Score and Plan: 1 and Propofol infusion ? ?Airway Management Planned: Natural Airway ? ?Additional Equipment:  ? ?Intra-op Plan:  ? ?Post-operative Plan:  ? ?Informed Consent: I have reviewed the patients History and Physical, chart, labs and discussed the procedure including the risks, benefits and alternatives for the proposed anesthesia with the patient or authorized representative who has indicated his/her understanding and acceptance.  ? ? ? ?Dental advisory given ? ?Plan  Discussed with: CRNA and Anesthesiologist ? ?Anesthesia Plan Comments:   ? ? ? ? ? ?Anesthesia Quick Evaluation ? ?

## 2022-03-07 ENCOUNTER — Telehealth (HOSPITAL_COMMUNITY): Payer: Self-pay | Admitting: *Deleted

## 2022-03-07 ENCOUNTER — Encounter: Payer: Self-pay | Admitting: Gastroenterology

## 2022-03-07 NOTE — Telephone Encounter (Signed)
Reaching out to patient to offer assistance regarding upcoming cardiac imaging study; pt verbalizes understanding of appt date/time, parking situation and where to check in, and verified current allergies; name and call back number provided for further questions should they arise ? ?Gordy Clement RN Navigator Cardiac Imaging ?[facility] ?[phone] office ?[phone] cell ? ?Patient denies claustrophobia or metal.  He wanted me to confirm his transportation. I told him I will have to look into that and call him back. He verbalized understanding. ?

## 2022-03-08 ENCOUNTER — Ambulatory Visit (HOSPITAL_COMMUNITY)
Admission: RE | Admit: 2022-03-08 | Discharge: 2022-03-08 | Disposition: A | Discharge status: Home / Self Care | Payer: Medicare Other | Source: Ambulatory Visit | Attending: Cardiology | Admitting: Cardiology

## 2022-03-08 ENCOUNTER — Other Ambulatory Visit: Payer: Self-pay

## 2022-03-08 ENCOUNTER — Encounter: Payer: Self-pay | Admitting: Gastroenterology

## 2022-03-08 DIAGNOSIS — I5042 Chronic combined systolic (congestive) and diastolic (congestive) heart failure: Secondary | ICD-10-CM

## 2022-03-08 LAB — SURGICAL PATHOLOGY

## 2022-03-08 MED ORDER — GADOBUTROL 1 MMOL/ML IV SOLN
10.0000 mL | Freq: Once | INTRAVENOUS | Status: AC | PRN
  Administered 2022-03-08: 10 mL via INTRAVENOUS

## 2022-03-13 ENCOUNTER — Telehealth: Payer: Self-pay | Admitting: Cardiology

## 2022-03-13 NOTE — Telephone Encounter (Signed)
Patient calling to review mri results. ?

## 2022-03-14 NOTE — Telephone Encounter (Signed)
Advised patient that I did not have MRI result yet. Will call him when I have an update. Also noted OV visit set for next week with Dr. Quentin Ore and that maybe when he gets his results.  ?Verbalized understanding.  ?

## 2022-03-15 ENCOUNTER — Encounter: Payer: Self-pay | Admitting: Internal Medicine

## 2022-03-15 ENCOUNTER — Ambulatory Visit (INDEPENDENT_AMBULATORY_CARE_PROVIDER_SITE_OTHER): Payer: Medicare Other | Admitting: Internal Medicine

## 2022-03-15 DIAGNOSIS — I429 Cardiomyopathy, unspecified: Secondary | ICD-10-CM | POA: Diagnosis not present

## 2022-03-15 DIAGNOSIS — Z6841 Body Mass Index (BMI) 40.0 and over, adult: Secondary | ICD-10-CM

## 2022-03-15 DIAGNOSIS — R058 Other specified cough: Secondary | ICD-10-CM | POA: Diagnosis not present

## 2022-03-15 DIAGNOSIS — G4733 Obstructive sleep apnea (adult) (pediatric): Secondary | ICD-10-CM | POA: Diagnosis not present

## 2022-03-15 MED ORDER — METHYLPREDNISOLONE ACETATE 80 MG/ML IJ SUSP: 80.0000 mg | Freq: Once | INTRAMUSCULAR | Status: DC

## 2022-03-15 MED ORDER — METHYLPREDNISOLONE ACETATE 80 MG/ML IJ SUSP
80.0000 mg | Freq: Once | INTRAMUSCULAR | Status: AC
  Administered 2022-03-15: 80 mg via INTRAMUSCULAR

## 2022-03-15 MED ORDER — BENZONATATE 200 MG PO CAPS: 200.0000 mg | ORAL_CAPSULE | Freq: Three times a day (TID) | ORAL | 0 refills | Status: AC | PRN

## 2022-03-15 NOTE — Progress Notes (Signed)
? ?[name], male    DOB: 1966-05-14    MRN: [id number] ? ? ?Brief patient profile:  ?72  yobm   never smoker healthy and played LB @ 200 lb senior year  referred to pulmonary clinic in [facility]  03/15/2022 by Marnee Guarneri  for refractory cough previously dx as copd and on 02 / anoro since ? 2021   ? ? ? ? ?History of Present Illness  ?03/15/2022  Pulmonary/ 1st office eval/ Melvyn Novas / [employer]  Office  ?Chief Complaint  ?Patient presents with  ? pulmonary consult  ?  Hx of COPD- c/o dry cough and sob with exertion.  ?Dyspnea:  can do [employer] / walks across parking lot  ?Cough: mostly dry and day > noct ?Sleep: settles down overnight /trilogy machine per [employer] ?SABA use: albuterol seems to help breathing but not the cough only once a day  ?02 3lpm 24/7  ? ?No obvious day to day or daytime variability or assoc excess/ purulent sputum or mucus plugs or hemoptysis or cp or chest tightness, subjective wheeze or overt sinus or hb symptoms.  ? ?Sleeping  without nocturnal  or early am exacerbation  of respiratory  c/o's or need for noct saba. Also denies any obvious fluctuation of symptoms with weather or environmental changes or other aggravating or alleviating factors except as outlined above  ? ?No unusual exposure hx or h/o childhood pna/ asthma or knowledge of premature birth. ? ?Current Allergies, Complete Past Medical History, Past Surgical History, Family History, and Social History were reviewed in [employer] record. ? ?ROS  The following are not active complaints unless bolded ?Hoarseness, sore throat, dysphagia, dental problems, itching, sneezing,  nasal congestion or discharge of excess mucus or purulent secretions, ear ache,   fever, chills, sweats, unintended wt loss or wt gain, classically pleuritic or exertional cp,  orthopnea pnd or arm/hand swelling  or leg swelling, presyncope, palpitations, abdominal pain, anorexia, nausea, vomiting, diarrhea  or change in  bowel habits or change in bladder habits, change in stools or change in urine, dysuria, hematuria,  rash, arthralgias, visual complaints, headache, numbness, weakness or ataxia or problems with walking or coordination,  change in mood or  memory. ?      ?   ? ? ?Past Medical History:  ?Diagnosis Date  ? Anemia of chronic disease   ? Bell's palsy   ? Cataract   ? Chronic combined systolic (congestive) and diastolic (congestive) heart failure (HCC)   ? Chronic respiratory failure with hypoxia (HCC)   ? 3 lpm continuous  ? COPD (chronic obstructive pulmonary disease) ([place])   ? Coronary artery disease   ? Diabetes mellitus without complication ([place])   ? Dyspnea   ? Wheezing  ? ESRD on hemodialysis ([place])   ? GERD (gastroesophageal reflux disease)   ? Gout   ? HOH (hard of hearing)   ? mild  ? Hypercholesterolemia   ? Hypertension   ? NSVT (nonsustained ventricular tachycardia)   ? Obstructive sleep apnea   ? CPAP, O2 use continuosly at 3LPM  ? Orthopnea   ? Pneumonia   ? In Past X2 most recent 1 yr ago  ? Psoriasis   ? Pulmonary HTN ([place])   ? Ventricular fibrillation ([place])   ? ? ?Outpatient Medications Prior to Visit  ?Medication Sig Dispense Refill  ? Accu-Chek Softclix Lancets lancets Use to check blood sugars 2-3 times daily with goals = <130 fasting in morning and <180  two hours after eating.  Bring blood sugar log to appointments. 100 each 12  ? albuterol (PROVENTIL HFA;VENTOLIN HFA) 108 (90 Base) MCG/ACT inhaler Inhale 2 puffs into the lungs every 4 (four) hours as needed for wheezing or shortness of breath. 6.7 g 5  ? allopurinol (ZYLOPRIM) 100 MG tablet Take 100 mg by mouth daily.    ? ANORO ELLIPTA 62.5-25 MCG/INH AEPB INHALE 1 PUFF INTO THE LUNGS DAILY. 60 each 11  ? aspirin EC 81 MG tablet Take 81 mg by mouth in the morning. Swallow whole.    ? atorvastatin (LIPITOR) 40 MG tablet Take 1 tablet (40 mg total) by mouth at bedtime. 90 tablet 1  ? Blood Glucose Monitoring Suppl (ACCU-CHEK GUIDE ME) w/Device KIT Use  to check blood sugars 2-3 times daily with goals = <130 fasting in morning and <180 two hours after eating.  Bring blood sugar log to appointments. 1 kit 0  ? calcium acetate (PHOSLO) 667 MG capsule Take 667 mg by mouth 3 (three) times daily with meals.    ? Cholecalciferol (D3-1000) 25 MCG (1000 UT) tablet Take 2 tablets (2,000 Units total) by mouth daily. 60 tablet 3  ? clopidogrel (PLAVIX) 75 MG tablet Take 1 tablet (75 mg total) by mouth daily with breakfast. 90 tablet 0  ? docusate sodium (COLACE) 100 MG capsule Take 1 capsule (100 mg total) by mouth daily. 90 capsule 3  ? gabapentin (NEURONTIN) 300 MG capsule Take 300 mg by mouth at bedtime.    ? glimepiride (AMARYL) 4 MG tablet Take 4 mg by mouth daily with breakfast.    ? glucose blood test strip Use to check blood sugars 2-3 times daily with goals = <130 fasting in morning and <180 two hours after eating.  Bring blood sugar log to appointments. 100 each 12  ? insulin degludec (TRESIBA FLEXTOUCH) 100 UNIT/ML FlexTouch Pen Inject 20 Units into the skin at bedtime. 6 mL 3  ? isosorbide mononitrate (IMDUR) 30 MG 24 hr tablet Take 0.5 tablets (15 mg total) by mouth daily. 15 tablet 3  ? metoprolol succinate (TOPROL-XL) 50 MG 24 hr tablet Take 1 tablet (50 mg total) by mouth daily. 30 tablet 3  ? Multiple Vitamins-Minerals (TAB-A-VITE) TABS TAKE 1 TABLET BY MOUTH ONCE DAILY. 90 tablet 3  ? ondansetron (ZOFRAN) 4 MG tablet Take 1 tablet (4 mg total) by mouth every 8 (eight) hours as needed for nausea or vomiting. 20 tablet 3  ? OXYGEN Inhale 3 L into the lungs daily.    ? pantoprazole (PROTONIX) 40 MG tablet Take 1 tablet (40 mg total) by mouth daily. 90 tablet 0  ? Risankizumab-rzaa (SKYRIZI) 150 MG/ML SOSY Inject 150 mg into the skin as directed. Every 12 weeks for maintenance. 1 mL 1  ? Roflumilast (ZORYVE) 0.3 % CREA Apply 1 application topically daily. To the more severe areas 180 g 0  ? sacubitril-valsartan (ENTRESTO) 49-51 MG Take 1 tablet by mouth 2 (two)  times daily. STOP taking your Losartan and START taking Entresto. 60 tablet 5  ? silodosin (RAPAFLO) 4 MG CAPS capsule TAKE 1 CAPSULE ONCE DAILY WITH BREAKFAST 90 capsule 3  ? Suvorexant (BELSOMRA) 10 MG TABS Take 10 mg by mouth at bedtime as needed. 30 tablet 4  ? tiZANidine (ZANAFLEX) 2 MG tablet Take 1 tablet (2 mg total) by mouth daily as needed for muscle spasms. 30 tablet 3  ? torsemide 40 MG TABS Take TWO tablets (80 mg) TWICE daily only on days  you DO NOT have dialysis: Tuesday, Thursday, Saturday and Sunday 180 tablet 3  ? triamcinolone (KENALOG) 0.025 % cream Apply 1 application topically daily in the afternoon.    ? TRULICITY 3 RT/8.0YH SOPN Inject 3 mg into the skin every Thursday.    ? ?No facility-administered medications prior to visit.  ? ? ? ?Objective:  ?  ? ?BP 140/90 (BP Location: Left Arm, Cuff Size: Large)   Pulse 91   Temp 98.3 ?F (36.8 ?C) (Temporal)   Ht _0  (1.6 m)   Wt 259 lb (117.5 kg)   SpO2 90%   BMI 45.88 kg/m?  ? ?SpO2: 90 % ?O2 Type: Pulse O2 ?O2 Flow Rate (L/min): 3 L/min ? ?Obese bm nad  ? ? HEENT : pt wearing mask not removed for exam due to covid -19 concerns.  ? ? ?NECK :  without JVD/Nodes/TM/ nl carotid upstrokes bilaterally ? ? ?LUNGS: no acc muscle use,  Nl contour chest which is clear to A and P bilaterally without cough on insp or exp maneuvers ? ? ?CV:  RRR  no s3 or murmur or increase in P2, and no edema  ? ?ABD: quite obese soft and nontender with nl inspiratory excursion in the supine position. No bruits or organomegaly appreciated, bowel sounds nl ? ?MS:  Nl gait/ ext warm without deformities, calf tenderness, cyanosis or clubbing ?No obvious joint restrictions  ? ?SKIN: warm and dry without lesions   ? ?NEURO:  alert, approp, nl sensorium with  no motor or cerebellar deficits apparent.  ? ?CXR PA and Lateral:   03/15/2022 :    ?I personally reviewed images and agree with radiology impression as follows:   ?Did not go to xray as rec  ? ? ?   ?Assessment   ? ?Upper airway cough syndrome ?Onset mid march 2023 "worst cough ever" on Entresto ?- 03/15/2022 rx try tessalon and max gerd rx, consider trial off entresto if persists ? ?Upper airway cough syndrome (previousl

## 2022-03-15 NOTE — Patient Instructions (Addendum)
Stop Anoro ? ?Depomedrol 80 mg IM  ? ?Protonix (pantoprazole) 40 mg Take 30-60 min before first meal of the day  ? ?For cough > tessalon 200 mg up to every 6 hours as needed and take the gabapentin twice daily until better  ? ?For breathing > albuterol 2 puffs up to every 4 hours as needed  ? ?You will probably need to be changed from entresto to an alternative if the cough persists ? ?GERD (REFLUX)  is an extremely common cause of respiratory symptoms just like yours , many times with no obvious heartburn at all.  ? ? It can be treated with medication, but also with lifestyle changes including elevation of the head of your bed (ideally with 6 -8inch blocks under the headboard of your bed),  Smoking cessation, avoidance of late meals, excessive alcohol, and avoid fatty foods, chocolate, peppermint, colas, red wine, and acidic juices such as orange juice.  ?NO MINT OR MENTHOL PRODUCTS SO NO COUGH DROPS  ?USE SUGARLESS CANDY INSTEAD (Jolley ranchers or Stover's or Life Savers) or even ice chips will also do - the key is to swallow to prevent all throat clearing. ?NO OIL BASED VITAMINS - use powdered substitutes.  Avoid fish oil when coughing.  ? ? ?You will return to see one of our sleep doctors re your Trilogy machine that Dr Felicie Morn ordered.  ? ? ?

## 2022-03-16 ENCOUNTER — Encounter: Payer: Self-pay | Admitting: Internal Medicine

## 2022-03-16 ENCOUNTER — Telehealth: Payer: Self-pay | Admitting: Internal Medicine

## 2022-03-16 NOTE — Telephone Encounter (Signed)
Patient is aware of below message and voiced his understanding.  ?He stated that he would come by next week for CXR.  ?Nothing further needed. ?  ?

## 2022-03-16 NOTE — Telephone Encounter (Signed)
Looks like I ordered the xray and forgot to tell you or put it avs  ? ?See if he can come back by to finish the w/u ?

## 2022-03-16 NOTE — Assessment & Plan Note (Signed)
Body mass index is 45.88 kg/m?.   ?Lab Results  ?Component Value Date  ? TSH 1.712 01/08/2022  ?  ? ? ?Contributing to doe and risk of GERD ?>>>   reviewed the need and the process to achieve and maintain neg calorie balance > defer f/u primary care including intermittently monitoring thyroid status    ?

## 2022-03-16 NOTE — Assessment & Plan Note (Signed)
Onset mid march 2023 "worst cough ever" on Entresto ?- 03/15/2022 rx try tessalon and max gerd rx, consider trial off entresto if persists ? ?Upper airway cough syndrome (previously labeled PNDS),  is so named because it's frequently impossible to sort out how much is  CR/sinusitis with freq throat clearing (which can be related to primary GERD)   vs  causing  secondary (" extra esophageal")  GERD from wide swings in gastric pressure that occur with throat clearing, often  promoting self use of mint and menthol lozenges that reduce the lower esophageal sphincter tone and exacerbate the problem further in a cyclical fashion.  ? ?These are the same pts (now being labeled as having "irritable larynx syndrome" by some cough centers) who not infrequently have a history of having failed to tolerate ace inhibitors(or entresto which also interferes with bradykinin clearance) ,  dry powder inhalers(like anoro)  or biphosphonates or report having atypical/extraesophageal reflux symptoms that don't respond to standard doses of PPI  and are easily confused as having aecopd or asthma flares by even experienced allergists/ pulmonologists (myself included).  ? ?rec max rx for gerd/cyclical cough then f/u by me prn, in meantime return to clinic to sleep docs. ?

## 2022-03-16 NOTE — Assessment & Plan Note (Signed)
Trilogy dep > referred to sleep medicine 03/15/2022  ?

## 2022-03-16 NOTE — Assessment & Plan Note (Addendum)
Clinically chf has  improved but now "worst ever cough " on entresto that blocks bradykinin metabolism, the same peptide implicated in ACEi cough  ? ?May need to consider short term trial off if not improving but would need very close cards f/u during the trial of monotherapy with valsartan in double the dose of what is in the entresto rx.  ? ?Will defer for now ? ? ?     ?  ? ?Each maintenance medication was reviewed in detail including emphasizing most importantly the difference between maintenance and prns and under what circumstances the prns are to be triggered using an action plan format where appropriate. ? ?Total time for H and P, chart review, counseling, reviewing 02/ vent  device(s) and generating customized AVS unique to this initial  office visit / same day charting = 34 min ?     ?

## 2022-03-21 ENCOUNTER — Encounter: Payer: Self-pay | Admitting: Cardiology

## 2022-03-21 ENCOUNTER — Ambulatory Visit (INDEPENDENT_AMBULATORY_CARE_PROVIDER_SITE_OTHER): Payer: Medicare Other | Admitting: Cardiology

## 2022-03-21 VITALS — BP 124/84 | HR 86 | Ht 63.0 in | Wt 252.0 lb

## 2022-03-21 DIAGNOSIS — N186 End stage renal disease: Secondary | ICD-10-CM

## 2022-03-21 DIAGNOSIS — I4901 Ventricular fibrillation: Secondary | ICD-10-CM

## 2022-03-21 DIAGNOSIS — I5042 Chronic combined systolic (congestive) and diastolic (congestive) heart failure: Secondary | ICD-10-CM

## 2022-03-21 NOTE — Progress Notes (Signed)
?Electrophysiology Office Follow up Visit Note:   ? ?Date:  03/21/2022  ? ?ID:  [name], DOB 03-Mar-1966, MRN [id number] ? ?PCP:  Venita Lick, NP  ?[facility] Cardiologist:  Kate Sable, MD  ?[facility] Electrophysiologist:  Vickie Epley, MD  ? ? ?Interval History:   ? ?[name] is a 56 y.o. male who presents for a follow up visit. They were last seen on November 29, 2021 via virtual visit.  He has a history of VF arrest and a chronically reduced left ventricular function.  At the last appointment I recommended a cardiac MRI to reassess the left ventricular function and help guide the decision on whether or not to implant a defibrillator.  Since I last saw the patient he has had his cardiac MRI which showed an ejection fraction of 26% with global hypokinesis.  There was significant scar burden in the anterior and lateral walls.  There is also reduced right ventricular function. ? ?He tells me that for the last 2 to 3 weeks he has had a frequent nonproductive cough.  It is impacting his daily activities.  He seen pulmonology.  He had no change in his oxygen requirement.  He tells me he was sick around the time that his cough started.  He has been on Entresto since 2022.  He was on Entresto for a long period of time without any coughing. ? ? ?  ? ?Past Medical History:  ?Diagnosis Date  ? Anemia of chronic disease   ? Bell's palsy   ? Cataract   ? Chronic combined systolic (congestive) and diastolic (congestive) heart failure (HCC)   ? Chronic respiratory failure with hypoxia (HCC)   ? 3 lpm continuous  ? COPD (chronic obstructive pulmonary disease) ([place])   ? Coronary artery disease   ? Diabetes mellitus without complication ([place])   ? Dyspnea   ? Wheezing  ? ESRD on hemodialysis ([place])   ? GERD (gastroesophageal reflux disease)   ? Gout   ? HOH (hard of hearing)   ? mild  ? Hypercholesterolemia   ? Hypertension   ? NSVT (nonsustained ventricular tachycardia) ([place])   ?  Obstructive sleep apnea   ? CPAP, O2 use continuosly at 3LPM  ? Orthopnea   ? Pneumonia   ? In Past X2 most recent 1 yr ago  ? Psoriasis   ? Pulmonary HTN ([place])   ? Ventricular fibrillation ([place])   ? ? ?Past Surgical History:  ?Procedure Laterality Date  ? A/V FISTULAGRAM Right 05/02/2021  ? Procedure: A/V FISTULAGRAM;  Surgeon: Katha Cabal, MD;  Location: [place] [facility];  Service: Cardiovascular;  Laterality: Right;  ? AV FISTULA PLACEMENT Right 11/20/2019  ? Procedure: ARTERIOVENOUS (AV) FISTULA CREATION ( BRACHIAL CEPHALIC );  Surgeon: Katha Cabal, MD;  Location: [facility];  Service: Vascular;  Laterality: Right;  ? CATARACT EXTRACTION W/PHACO Left 12/17/2019  ? Procedure: CATARACT EXTRACTION PHACO AND INTRAOCULAR LENS PLACEMENT ([place]) LEFT ISTENT INJ DIABETIC;  Surgeon: Eulogio Bear, MD;  Location: [facility];  Service: Ophthalmology;  Laterality: Left;  [place] 00:33.2 ?CDE 2.96 ?Fluid Pack Lot # [id number]  ? CATARACT EXTRACTION W/PHACO Right 02/24/2020  ? Procedure: CATARACT EXTRACTION PHACO AND INTRAOCULAR LENS PLACEMENT (I[facility]) RIGHT DIABETIC ISTENT INJ;  Surgeon: Eulogio Bear, MD;  Location: [facility];  Service: Ophthalmology;  Laterality: Right;  [place] 00:37.3 ?CDE 2.88 ?Fluid Pack Lot # [id number]  ? COLONOSCOPY WITH PROPOFOL N/A 03/06/2022  ? Procedure: COLONOSCOPY  WITH PROPOFOL;  Surgeon: Jonathon Bellows, MD;  Location: [facility] ENDOSCOPY;  Service: Gastroenterology;  Laterality: N/A;  ARRIVAL IS 6:45 AM - TRANSPORTATION IS ACTA. PATIENT CALLED 02/28/2022.  ? CORONARY ANGIOPLASTY WITH STENT PLACEMENT    ? stent placement  ? CORONARY STENT INTERVENTION N/A 08/28/2021  ? Procedure: CORONARY STENT INTERVENTION;  Surgeon: Nelva Bush, MD;  Location: [place] [facility];  Service: Cardiovascular;  Laterality: N/A;  ? DIALYSIS/PERMA CATHETER INSERTION N/A 06/22/2019  ? Procedure: DIALYSIS/PERMA CATHETER INSERTION;  Surgeon: Algernon Huxley, MD;  Location: [place] [facility];  Service: Cardiovascular;   Laterality: N/A;  ? DIALYSIS/PERMA CATHETER REMOVAL N/A 05/31/2020  ? Procedure: DIALYSIS/PERMA CATHETER REMOVAL;  Surgeon: Katha Cabal, MD;  Location: [place] [facility];  Service: Cardiovascular;  Laterality: N/A;  ? EYE SURGERY    ? INSERTION OF AHMED VALVE Left 12/17/2019  ? Procedure: INSERTION OF iSTENT;  Surgeon: Eulogio Bear, MD;  Location: [facility];  Service: Ophthalmology;  Laterality: Left;  ? INTRAVASCULAR IMAGING/OCT N/A 08/28/2021  ? Procedure: INTRAVASCULAR IMAGING/OCT;  Surgeon: Nelva Bush, MD;  Location: [place] [facility];  Service: Cardiovascular;  Laterality: N/A;  ? LEFT HEART CATH AND CORONARY ANGIOGRAPHY N/A 08/08/2021  ? Procedure: LEFT HEART CATH AND CORONARY ANGIOGRAPHY;  Surgeon: Nelva Bush, MD;  Location: [place] [facility];  Service: Cardiovascular;  Laterality: N/A;  ? ? ?Current Medications: ?Current Meds  ?Medication Sig  ? Accu-Chek Softclix Lancets lancets Use to check blood sugars 2-3 times daily with goals = <130 fasting in morning and <180 two hours after eating.  Bring blood sugar log to appointments.  ? albuterol (PROVENTIL HFA;VENTOLIN HFA) 108 (90 Base) MCG/ACT inhaler Inhale 2 puffs into the lungs every 4 (four) hours as needed for wheezing or shortness of breath.  ? allopurinol (ZYLOPRIM) 100 MG tablet Take 100 mg by mouth daily.  ? aspirin EC 81 MG tablet Take 81 mg by mouth in the morning. Swallow whole.  ? benzonatate (TESSALON) 200 MG capsule Take 1 capsule (200 mg total) by mouth 3 (three) times daily as needed for cough.  ? Blood Glucose Monitoring Suppl (ACCU-CHEK GUIDE ME) w/Device KIT Use to check blood sugars 2-3 times daily with goals = <130 fasting in morning and <180 two hours after eating.  Bring blood sugar log to appointments.  ? calcium acetate (PHOSLO) 667 MG capsule Take 667 mg by mouth 3 (three) times daily with meals.  ? Cholecalciferol (D3-1000) 25 MCG (1000 UT) tablet Take 2 tablets (2,000 Units total) by mouth daily.  ?  clopidogrel (PLAVIX) 75 MG tablet Take 1 tablet (75 mg total) by mouth daily with breakfast.  ? docusate sodium (COLACE) 100 MG capsule Take 1 capsule (100 mg total) by mouth daily.  ? gabapentin (NEURONTIN) 300 MG capsule Take 300 mg by mouth at bedtime.  ? glimepiride (AMARYL) 4 MG tablet Take 4 mg by mouth daily with breakfast.  ? glucose blood test strip Use to check blood sugars 2-3 times daily with goals = <130 fasting in morning and <180 two hours after eating.  Bring blood sugar log to appointments.  ? insulin degludec (TRESIBA FLEXTOUCH) 100 UNIT/ML FlexTouch Pen Inject 20 Units into the skin at bedtime.  ? isosorbide mononitrate (IMDUR) 30 MG 24 hr tablet Take 0.5 tablets (15 mg total) by mouth daily.  ? metoprolol succinate (TOPROL-XL) 50 MG 24 hr tablet Take 1 tablet (50 mg total) by mouth daily.  ? Multiple Vitamins-Minerals (TAB-A-VITE) TABS TAKE 1  TABLET BY MOUTH ONCE DAILY.  ? ondansetron (ZOFRAN) 4 MG tablet Take 1 tablet (4 mg total) by mouth every 8 (eight) hours as needed for nausea or vomiting.  ? OXYGEN Inhale 3 L into the lungs daily.  ? pantoprazole (PROTONIX) 40 MG tablet Take 1 tablet (40 mg total) by mouth daily.  ? Risankizumab-rzaa (SKYRIZI) 150 MG/ML SOSY Inject 150 mg into the skin as directed. Every 12 weeks for maintenance.  ? Roflumilast (ZORYVE) 0.3 % CREA Apply 1 application topically daily. To the more severe areas  ? sacubitril-valsartan (ENTRESTO) 49-51 MG Take 1 tablet by mouth 2 (two) times daily. STOP taking your Losartan and START taking Entresto.  ? silodosin (RAPAFLO) 4 MG CAPS capsule TAKE 1 CAPSULE ONCE DAILY WITH BREAKFAST  ? Suvorexant (BELSOMRA) 10 MG TABS Take 10 mg by mouth at bedtime as needed.  ? tiZANidine (ZANAFLEX) 2 MG tablet Take 1 tablet (2 mg total) by mouth daily as needed for muscle spasms.  ? torsemide 40 MG TABS Take TWO tablets (80 mg) TWICE daily only on days you DO NOT have dialysis: Tuesday, Thursday, Saturday and Sunday  ? triamcinolone (KENALOG)  0.025 % cream Apply 1 application topically daily in the afternoon.  ? TRULICITY 3 NM/0.7WK SOPN Inject 3 mg into the skin every Thursday.  ?  ? ?Allergies:   Shellfish allergy, Betadine [povidone iodine], and

## 2022-03-21 NOTE — Patient Instructions (Signed)
Medications: ?Your physician recommends that you continue on your current medications as directed. Please refer to the Current Medication list given to you today. ?*If you need a refill on your cardiac medications before your next appointment, please call your pharmacy* ? ?Lab Work: ?None. ?If you have labs (blood work) drawn today and your tests are completely normal, you will receive your results only by: ?MyChart Message (if you have MyChart) OR ?A paper copy in the mail ?If you have any lab test that is abnormal or we need to change your treatment, we will call you to review the results. ? ?Testing/Procedures: ?None. ? ?Follow-Up: ?At [facility], you and your health needs are our priority.  As part of our continuing mission to provide you with exceptional heart care, we have created designated Provider Care Teams.  These Care Teams include your primary Cardiologist (physician) and Advanced Practice Providers (APPs -  Physician Assistants and Nurse Practitioners) who all work together to provide you with the care you need, when you need it. ? ?Your physician wants you to follow-up in: 4 weeks with Dr. Mylo Red- Charlestine Night.  ? ?We recommend signing up for the patient portal called "MyChart".  Sign up information is provided on this After Visit Summary.  MyChart is used to connect with patients for Virtual Visits (Telemedicine).  Patients are able to view lab/test results, encounter notes, upcoming appointments, etc.  Non-urgent messages can be sent to your provider as well.   ?To learn more about what you can do with MyChart, go to [email or web address].   ? ?Any Other Special Instructions Will Be Listed Below (If Applicable).  ?

## 2022-03-22 ENCOUNTER — Ambulatory Visit: Payer: Medicare Other | Admitting: Podiatry

## 2022-03-22 ENCOUNTER — Other Ambulatory Visit: Payer: Self-pay | Admitting: *Deleted

## 2022-03-22 MED ORDER — ENTRESTO 49-51 MG PO TABS: 1.0000 | ORAL_TABLET | Freq: Two times a day (BID) | ORAL | 0 refills | Status: DC

## 2022-03-29 ENCOUNTER — Ambulatory Visit (INDEPENDENT_AMBULATORY_CARE_PROVIDER_SITE_OTHER): Payer: Medicare Other | Admitting: Podiatry

## 2022-03-29 ENCOUNTER — Encounter: Payer: Self-pay | Admitting: Podiatry

## 2022-03-29 DIAGNOSIS — M79676 Pain in unspecified toe(s): Secondary | ICD-10-CM

## 2022-03-29 DIAGNOSIS — N186 End stage renal disease: Secondary | ICD-10-CM | POA: Diagnosis not present

## 2022-03-29 DIAGNOSIS — B351 Tinea unguium: Secondary | ICD-10-CM

## 2022-03-29 DIAGNOSIS — I872 Venous insufficiency (chronic) (peripheral): Secondary | ICD-10-CM

## 2022-03-29 DIAGNOSIS — E1142 Type 2 diabetes mellitus with diabetic polyneuropathy: Secondary | ICD-10-CM

## 2022-03-29 NOTE — Progress Notes (Signed)
This patient returns to my office for at risk foot care.  This patient requires this care by a professional since this patient will be at risk due to having diabetes mellitus and ESRD.  This patient is unable to cut nails himself since the patient cannot reach his nails.These nails are painful walking and wearing shoes.  This patient presents for at risk foot care today. ? ?General Appearance  Alert, conversant and in no acute stress. ? ?Vascular  Dorsalis pedis and posterior tibial  pulses are weakly  palpable due to swelling. bilaterally.  Capillary return is within normal limits  bilaterally. Temperature is within normal limits  Bilaterally.  Venous stasis  B/L. ? ?Neurologic  Senn-Weinstein monofilament wire test within normal limits  bilaterally. Muscle power within normal limits bilaterally. ? ?Nails Thick disfigured discolored nails with subungual debris  from hallux to fifth toes bilaterally. No evidence of bacterial infection or drainage bilaterally. ? ?Orthopedic  No limitations of motion  feet .  No crepitus or effusions noted.  No bony pathology or digital deformities noted. ? ?Skin  normotropic skin with no porokeratosis noted bilaterally.  No signs of infections or ulcers noted.  Punctate psoriasis plantar aspect feet  B/l.  ? ?Onychomycosis  Pain in right toes  Pain in left toes ? ?Consent was obtained for treatment procedures.   Mechanical debridement of nails 1-5  bilaterally performed with a nail nipper.  Filed with dremel without incident.  ? ? ?Return office visit  10 weeks                   Told patient to return for periodic foot care and evaluation due to potential at risk complications. ? ? ?Gardiner Barefoot DPM  ?

## 2022-04-03 ENCOUNTER — Telehealth: Payer: Self-pay | Admitting: Internal Medicine

## 2022-04-03 NOTE — Telephone Encounter (Signed)
Spoke to patient and scheduled OV 05/03/2022 with TP. ?Nothing further needed.  ? ?

## 2022-04-11 ENCOUNTER — Other Ambulatory Visit: Payer: Self-pay | Admitting: Dermatology

## 2022-04-19 ENCOUNTER — Ambulatory Visit (INDEPENDENT_AMBULATORY_CARE_PROVIDER_SITE_OTHER): Payer: Medicare Other | Admitting: Cardiology

## 2022-04-19 ENCOUNTER — Encounter: Payer: Self-pay | Admitting: Cardiology

## 2022-04-19 VITALS — BP 120/62 | HR 81 | Ht 63.0 in | Wt 252.0 lb

## 2022-04-19 DIAGNOSIS — I10 Essential (primary) hypertension: Secondary | ICD-10-CM

## 2022-04-19 DIAGNOSIS — I255 Ischemic cardiomyopathy: Secondary | ICD-10-CM

## 2022-04-19 DIAGNOSIS — I4901 Ventricular fibrillation: Secondary | ICD-10-CM | POA: Diagnosis not present

## 2022-04-19 DIAGNOSIS — I2510 Atherosclerotic heart disease of native coronary artery without angina pectoris: Secondary | ICD-10-CM | POA: Diagnosis not present

## 2022-04-19 MED ORDER — DAPAGLIFLOZIN PROPANEDIOL 10 MG PO TABS: 10.0000 mg | ORAL_TABLET | Freq: Every day | ORAL | 3 refills | Status: DC

## 2022-04-19 MED ORDER — LOSARTAN POTASSIUM 25 MG PO TABS: 12.5000 mg | ORAL_TABLET | Freq: Every day | ORAL | 3 refills | Status: DC

## 2022-04-19 NOTE — Progress Notes (Signed)
?Cardiology Office Note:   ? ?Date:  04/19/2022  ? ?ID:  [name], DOB Jan 22, 1966, MRN [id number] ? ?PCP:  Venita Lick, NP  ?Cardiologist:  Kate Sable, MD  ?Electrophysiologist:  Vickie Epley, MD  ? ? ?Chief Complaint  ?Patient presents with  ? Other  ?  3 month follow up -- patient c.o no energy and a cough. Meds reviewed verbally with patient.   ? ? ?History of Present Illness:   ? ?[name] is a 56 y.o. male with a hx of CAD(PCI to RCA 2019, PCI to LAD and Lcx 08/2021) VF arrest 04/2021 (deemed ischemic w/ subsequent pci), hypertension, diabetes, ESRD on dialysis 3 times a week, OSA, COPD, who presents for follow-up.   ? ?Being seen for cardiomyopathy, CAD.  Has noticed worsening Fatigue, shortness of breath, dry cough.  [name] was stopped about 4 weeks ago due to dry cough.  Coughing has persisted despite stopping Entresto.  CMR was obtained to evaluate EF, noted to be 26%.  Evaluated by EP for ICD consideration, deemed not a candidate. ? ?Prior notes ?CMR LVEF 26%, anterior lateral wall subendocardial scar. ?Echo 09/2021 EF 35 to 40%. ?Echo 04/2021 EF 50 to 55% ?LHC 07/2021 80% LAD, 90% Lcx , patent mid RCA stent s/p pci in 08/2021 with DES to LAD and Lcx. ?echo on 09/2019 showed mildly reduced EF 45 to 50%,  ?Lexiscan 08/2019 did not show any evidence for ischemia.  ?Not on Aldactone due to previous hyperkalemia. ? ?Past Medical History:  ?Diagnosis Date  ? Anemia of chronic disease   ? Bell's palsy   ? Cataract   ? Chronic combined systolic (congestive) and diastolic (congestive) heart failure (HCC)   ? Chronic respiratory failure with hypoxia (HCC)   ? 3 lpm continuous  ? COPD (chronic obstructive pulmonary disease) ([place])   ? Coronary artery disease   ? Diabetes mellitus without complication ([place])   ? Dyspnea   ? Wheezing  ? ESRD on hemodialysis ([place])   ? GERD (gastroesophageal reflux disease)   ? Gout   ? HOH (hard of hearing)   ? mild  ? Hypercholesterolemia   ? Hypertension    ? NSVT (nonsustained ventricular tachycardia) ([place])   ? Obstructive sleep apnea   ? CPAP, O2 use continuosly at 3LPM  ? Orthopnea   ? Pneumonia   ? In Past X2 most recent 1 yr ago  ? Psoriasis   ? Pulmonary HTN ([place])   ? Ventricular fibrillation ([place])   ? ? ?Past Surgical History:  ?Procedure Laterality Date  ? A/V FISTULAGRAM Right 05/02/2021  ? Procedure: A/V FISTULAGRAM;  Surgeon: Katha Cabal, MD;  Location: [place] [facility];  Service: Cardiovascular;  Laterality: Right;  ? AV FISTULA PLACEMENT Right 11/20/2019  ? Procedure: ARTERIOVENOUS (AV) FISTULA CREATION ( BRACHIAL CEPHALIC );  Surgeon: Katha Cabal, MD;  Location: [facility];  Service: Vascular;  Laterality: Right;  ? CATARACT EXTRACTION W/PHACO Left 12/17/2019  ? Procedure: CATARACT EXTRACTION PHACO AND INTRAOCULAR LENS PLACEMENT ([place]) LEFT ISTENT INJ DIABETIC;  Surgeon: Eulogio Bear, MD;  Location: [facility];  Service: Ophthalmology;  Laterality: Left;  [place] 00:33.2 ?CDE 2.96 ?Fluid Pack Lot # [id number]  ? CATARACT EXTRACTION W/PHACO Right 02/24/2020  ? Procedure: CATARACT EXTRACTION PHACO AND INTRAOCULAR LENS PLACEMENT ([facility]) RIGHT DIABETIC ISTENT INJ;  Surgeon: Eulogio Bear, MD;  Location: [facility];  Service: Ophthalmology;  Laterality: Right;  [place] 00:37.3 ?CDE 2.88 ?Fluid Pack Lot #  [id number]  ? COLONOSCOPY WITH PROPOFOL N/A 03/06/2022  ? Procedure: COLONOSCOPY WITH PROPOFOL;  Surgeon: Jonathon Bellows, MD;  Location: [facility] ENDOSCOPY;  Service: Gastroenterology;  Laterality: N/A;  ARRIVAL IS 6:45 AM - TRANSPORTATION IS ACTA. PATIENT CALLED 02/28/2022.  ? CORONARY ANGIOPLASTY WITH STENT PLACEMENT    ? stent placement  ? CORONARY STENT INTERVENTION N/A 08/28/2021  ? Procedure: CORONARY STENT INTERVENTION;  Surgeon: Nelva Bush, MD;  Location: [place] [facility];  Service: Cardiovascular;  Laterality: N/A;  ? DIALYSIS/PERMA CATHETER INSERTION N/A 06/22/2019  ? Procedure: DIALYSIS/PERMA CATHETER INSERTION;  Surgeon: Algernon Huxley, MD;   Location: [place] [facility];  Service: Cardiovascular;  Laterality: N/A;  ? DIALYSIS/PERMA CATHETER REMOVAL N/A 05/31/2020  ? Procedure: DIALYSIS/PERMA CATHETER REMOVAL;  Surgeon: Katha Cabal, MD;  Location: [place] [facility];  Service: Cardiovascular;  Laterality: N/A;  ? EYE SURGERY    ? INSERTION OF AHMED VALVE Left 12/17/2019  ? Procedure: INSERTION OF iSTENT;  Surgeon: Eulogio Bear, MD;  Location: [facility];  Service: Ophthalmology;  Laterality: Left;  ? INTRAVASCULAR IMAGING/OCT N/A 08/28/2021  ? Procedure: INTRAVASCULAR IMAGING/OCT;  Surgeon: Nelva Bush, MD;  Location: [place] [facility];  Service: Cardiovascular;  Laterality: N/A;  ? LEFT HEART CATH AND CORONARY ANGIOGRAPHY N/A 08/08/2021  ? Procedure: LEFT HEART CATH AND CORONARY ANGIOGRAPHY;  Surgeon: Nelva Bush, MD;  Location: [place] [facility];  Service: Cardiovascular;  Laterality: N/A;  ? ? ?Current Medications: ?Current Meds  ?Medication Sig  ? Accu-Chek Softclix Lancets lancets Use to check blood sugars 2-3 times daily with goals = <130 fasting in morning and <180 two hours after eating.  Bring blood sugar log to appointments.  ? albuterol (PROVENTIL HFA;VENTOLIN HFA) 108 (90 Base) MCG/ACT inhaler Inhale 2 puffs into the lungs every 4 (four) hours as needed for wheezing or shortness of breath.  ? allopurinol (ZYLOPRIM) 100 MG tablet Take 100 mg by mouth daily.  ? aspirin EC 81 MG tablet Take 81 mg by mouth in the morning. Swallow whole.  ? atorvastatin (LIPITOR) 40 MG tablet Take 1 tablet (40 mg total) by mouth at bedtime.  ? benzonatate (TESSALON) 200 MG capsule Take 1 capsule (200 mg total) by mouth 3 (three) times daily as needed for cough.  ? Blood Glucose Monitoring Suppl (ACCU-CHEK GUIDE ME) w/Device KIT Use to check blood sugars 2-3 times daily with goals = <130 fasting in morning and <180 two hours after eating.  Bring blood sugar log to appointments.  ? calcium acetate (PHOSLO) 667 MG capsule Take 667 mg by  mouth 3 (three) times daily with meals.  ? Cholecalciferol (D3-1000) 25 MCG (1000 UT) tablet Take 2 tablets (2,000 Units total) by mouth daily.  ? clopidogrel (PLAVIX) 75 MG tablet Take 1 tablet (75 mg total) by mouth daily with breakfast.  ? dapagliflozin propanediol (FARXIGA) 10 MG TABS tablet Take 1 tablet (10 mg total) by mouth daily before breakfast.  ? docusate sodium (COLACE) 100 MG capsule Take 1 capsule (100 mg total) by mouth daily.  ? gabapentin (NEURONTIN) 300 MG capsule Take 300 mg by mouth at bedtime.  ? glimepiride (AMARYL) 4 MG tablet Take 4 mg by mouth daily with breakfast.  ? glucose blood test strip Use to check blood sugars 2-3 times daily with goals = <130 fasting in morning and <180 two hours after eating.  Bring blood sugar log to appointments.  ? insulin degludec (TRESIBA FLEXTOUCH) 100 UNIT/ML FlexTouch Pen Inject 20 Units into the  skin at bedtime.  ? isosorbide mononitrate (IMDUR) 30 MG 24 hr tablet Take 0.5 tablets (15 mg total) by mouth daily.  ? losartan (COZAAR) 25 MG tablet Take 0.5 tablets (12.5 mg total) by mouth daily.  ? metoprolol succinate (TOPROL-XL) 50 MG 24 hr tablet Take 1 tablet (50 mg total) by mouth daily.  ? Multiple Vitamins-Minerals (TAB-A-VITE) TABS TAKE 1 TABLET BY MOUTH ONCE DAILY.  ? ondansetron (ZOFRAN) 4 MG tablet Take 1 tablet (4 mg total) by mouth every 8 (eight) hours as needed for nausea or vomiting.  ? OXYGEN Inhale 3 L into the lungs daily.  ? pantoprazole (PROTONIX) 40 MG tablet Take 1 tablet (40 mg total) by mouth daily.  ? Risankizumab-rzaa (SKYRIZI) 150 MG/ML SOSY Inject 150 mg into the skin as directed. Every 12 weeks for maintenance.  ? silodosin (RAPAFLO) 4 MG CAPS capsule TAKE 1 CAPSULE ONCE DAILY WITH BREAKFAST  ? Suvorexant (BELSOMRA) 10 MG TABS Take 10 mg by mouth at bedtime as needed.  ? tiZANidine (ZANAFLEX) 2 MG tablet Take 1 tablet (2 mg total) by mouth daily as needed for muscle spasms.  ? torsemide 40 MG TABS Take TWO tablets (80 mg) TWICE  daily only on days you DO NOT have dialysis: Tuesday, Thursday, Saturday and Sunday  ? triamcinolone (KENALOG) 0.025 % cream Apply 1 application topically daily in the afternoon.  ? TRULICITY 3 W[place]/6.3ZC

## 2022-04-19 NOTE — Patient Instructions (Signed)
Medication Instructions:  ? ?Your physician has recommended you make the following change in your medication:  ? ? STOP taking your Entresto. ? ?2.    START taking Losartan 12.5 MG once a day. ? ?3.    START taking Farxiga 10 MG once a day. ? ? ?*If you need a refill on your cardiac medications before your next appointment, please call your pharmacy* ? ? ?Lab Work: ?None ordered ?If you have labs (blood work) drawn today and your tests are completely normal, you will receive your results only by: ?MyChart Message (if you have MyChart) OR ?A paper copy in the mail ?If you have any lab test that is abnormal or we need to change your treatment, we will call you to review the results. ? ? ?Testing/Procedures: ?None ordered ? ? ?Follow-Up: ?At [facility], you and your health needs are our priority.  As part of our continuing mission to provide you with exceptional heart care, we have created designated Provider Care Teams.  These Care Teams include your primary Cardiologist (physician) and Advanced Practice Providers (APPs -  Physician Assistants and Nurse Practitioners) who all work together to provide you with the care you need, when you need it. ? ?We recommend signing up for the patient portal called "MyChart".  Sign up information is provided on this After Visit Summary.  MyChart is used to connect with patients for Virtual Visits (Telemedicine).  Patients are able to view lab/test results, encounter notes, upcoming appointments, etc.  Non-urgent messages can be sent to your provider as well.   ?To learn more about what you can do with MyChart, go to [email or web address].   ? ?Your next appointment:   ?4-6 week(s) ? ?The format for your next appointment:   ?In Person ? ?Provider:   ? ?ONLY WITH ?Kate Sable, MD  ? ? ?Other Instructions ? ? ?Important Information About Sugar ? ? ? ? ? ? ?

## 2022-04-20 ENCOUNTER — Other Ambulatory Visit: Payer: Self-pay

## 2022-04-20 DIAGNOSIS — I2510 Atherosclerotic heart disease of native coronary artery without angina pectoris: Secondary | ICD-10-CM

## 2022-04-20 MED ORDER — METOPROLOL SUCCINATE ER 50 MG PO TB24: 50.0000 mg | ORAL_TABLET | Freq: Every day | ORAL | 1 refills | Status: DC

## 2022-04-20 MED ORDER — ISOSORBIDE MONONITRATE ER 30 MG PO TB24: 15.0000 mg | ORAL_TABLET | Freq: Every day | ORAL | 2 refills | Status: DC

## 2022-04-22 NOTE — Patient Instructions (Incomplete)

## 2022-04-24 ENCOUNTER — Other Ambulatory Visit: Payer: Self-pay

## 2022-04-24 ENCOUNTER — Ambulatory Visit: Payer: Medicare Other | Admitting: Nurse Practitioner

## 2022-04-24 DIAGNOSIS — E1142 Type 2 diabetes mellitus with diabetic polyneuropathy: Secondary | ICD-10-CM

## 2022-04-24 DIAGNOSIS — I4901 Ventricular fibrillation: Secondary | ICD-10-CM

## 2022-04-24 DIAGNOSIS — E1165 Type 2 diabetes mellitus with hyperglycemia: Secondary | ICD-10-CM

## 2022-04-24 DIAGNOSIS — I25118 Atherosclerotic heart disease of native coronary artery with other forms of angina pectoris: Secondary | ICD-10-CM

## 2022-04-24 DIAGNOSIS — N186 End stage renal disease: Secondary | ICD-10-CM

## 2022-04-24 DIAGNOSIS — J449 Chronic obstructive pulmonary disease, unspecified: Secondary | ICD-10-CM

## 2022-04-24 DIAGNOSIS — I429 Cardiomyopathy, unspecified: Secondary | ICD-10-CM

## 2022-04-24 DIAGNOSIS — E1169 Type 2 diabetes mellitus with other specified complication: Secondary | ICD-10-CM

## 2022-04-24 DIAGNOSIS — F5104 Psychophysiologic insomnia: Secondary | ICD-10-CM

## 2022-04-24 DIAGNOSIS — I2510 Atherosclerotic heart disease of native coronary artery without angina pectoris: Secondary | ICD-10-CM

## 2022-04-24 DIAGNOSIS — I5042 Chronic combined systolic (congestive) and diastolic (congestive) heart failure: Secondary | ICD-10-CM

## 2022-04-24 MED ORDER — PANTOPRAZOLE SODIUM 40 MG PO TBEC: 40.0000 mg | DELAYED_RELEASE_TABLET | Freq: Every day | ORAL | 0 refills | Status: DC

## 2022-04-24 MED ORDER — TORSEMIDE 40 MG PO TABS: ORAL_TABLET | ORAL | 3 refills | Status: DC

## 2022-04-24 MED ORDER — CLOPIDOGREL BISULFATE 75 MG PO TABS: 75.0000 mg | ORAL_TABLET | Freq: Every day | ORAL | 0 refills | Status: DC

## 2022-04-26 ENCOUNTER — Emergency Department: Payer: Medicare Other

## 2022-04-26 ENCOUNTER — Inpatient Hospital Stay
Admission: EM | Admit: 2022-04-26 | Discharge: 2022-04-28 | DRG: 377 | Disposition: A | Discharge status: Home / Self Care | Payer: Medicare Other | Attending: Internal Medicine | Admitting: Internal Medicine

## 2022-04-26 ENCOUNTER — Observation Stay: Payer: Medicare Other

## 2022-04-26 DIAGNOSIS — R234 Changes in skin texture: Secondary | ICD-10-CM | POA: Diagnosis present

## 2022-04-26 DIAGNOSIS — Z8249 Family history of ischemic heart disease and other diseases of the circulatory system: Secondary | ICD-10-CM

## 2022-04-26 DIAGNOSIS — N186 End stage renal disease: Secondary | ICD-10-CM | POA: Diagnosis present

## 2022-04-26 DIAGNOSIS — K5900 Constipation, unspecified: Secondary | ICD-10-CM | POA: Diagnosis present

## 2022-04-26 DIAGNOSIS — K649 Unspecified hemorrhoids: Secondary | ICD-10-CM | POA: Diagnosis present

## 2022-04-26 DIAGNOSIS — E8770 Fluid overload, unspecified: Secondary | ICD-10-CM | POA: Diagnosis present

## 2022-04-26 DIAGNOSIS — K602 Anal fissure, unspecified: Secondary | ICD-10-CM

## 2022-04-26 DIAGNOSIS — D631 Anemia in chronic kidney disease: Secondary | ICD-10-CM | POA: Diagnosis present

## 2022-04-26 DIAGNOSIS — E875 Hyperkalemia: Secondary | ICD-10-CM | POA: Diagnosis present

## 2022-04-26 DIAGNOSIS — K625 Hemorrhage of anus and rectum: Principal | ICD-10-CM

## 2022-04-26 DIAGNOSIS — Z992 Dependence on renal dialysis: Secondary | ICD-10-CM

## 2022-04-26 DIAGNOSIS — E1165 Type 2 diabetes mellitus with hyperglycemia: Secondary | ICD-10-CM | POA: Diagnosis present

## 2022-04-26 DIAGNOSIS — G4733 Obstructive sleep apnea (adult) (pediatric): Secondary | ICD-10-CM | POA: Diagnosis present

## 2022-04-26 DIAGNOSIS — I2510 Atherosclerotic heart disease of native coronary artery without angina pectoris: Secondary | ICD-10-CM | POA: Diagnosis present

## 2022-04-26 DIAGNOSIS — Z79899 Other long term (current) drug therapy: Secondary | ICD-10-CM

## 2022-04-26 DIAGNOSIS — N2581 Secondary hyperparathyroidism of renal origin: Secondary | ICD-10-CM | POA: Diagnosis present

## 2022-04-26 DIAGNOSIS — R109 Unspecified abdominal pain: Secondary | ICD-10-CM | POA: Diagnosis present

## 2022-04-26 DIAGNOSIS — Z955 Presence of coronary angioplasty implant and graft: Secondary | ICD-10-CM

## 2022-04-26 DIAGNOSIS — E1122 Type 2 diabetes mellitus with diabetic chronic kidney disease: Secondary | ICD-10-CM | POA: Diagnosis present

## 2022-04-26 DIAGNOSIS — Z794 Long term (current) use of insulin: Secondary | ICD-10-CM

## 2022-04-26 DIAGNOSIS — Z9981 Dependence on supplemental oxygen: Secondary | ICD-10-CM

## 2022-04-26 DIAGNOSIS — I132 Hypertensive heart and chronic kidney disease with heart failure and with stage 5 chronic kidney disease, or end stage renal disease: Secondary | ICD-10-CM | POA: Diagnosis present

## 2022-04-26 DIAGNOSIS — Z9109 Other allergy status, other than to drugs and biological substances: Secondary | ICD-10-CM

## 2022-04-26 DIAGNOSIS — N503 Cyst of epididymis: Secondary | ICD-10-CM | POA: Diagnosis present

## 2022-04-26 DIAGNOSIS — D849 Immunodeficiency, unspecified: Secondary | ICD-10-CM | POA: Diagnosis present

## 2022-04-26 DIAGNOSIS — Z7951 Long term (current) use of inhaled steroids: Secondary | ICD-10-CM

## 2022-04-26 DIAGNOSIS — I5042 Chronic combined systolic (congestive) and diastolic (congestive) heart failure: Secondary | ICD-10-CM | POA: Diagnosis present

## 2022-04-26 DIAGNOSIS — E7800 Pure hypercholesterolemia, unspecified: Secondary | ICD-10-CM | POA: Diagnosis present

## 2022-04-26 DIAGNOSIS — I429 Cardiomyopathy, unspecified: Secondary | ICD-10-CM | POA: Diagnosis present

## 2022-04-26 DIAGNOSIS — Z6841 Body Mass Index (BMI) 40.0 and over, adult: Secondary | ICD-10-CM

## 2022-04-26 DIAGNOSIS — Z91158 Patient's noncompliance with renal dialysis for other reason: Secondary | ICD-10-CM

## 2022-04-26 DIAGNOSIS — Z841 Family history of disorders of kidney and ureter: Secondary | ICD-10-CM

## 2022-04-26 DIAGNOSIS — I272 Pulmonary hypertension, unspecified: Secondary | ICD-10-CM | POA: Diagnosis present

## 2022-04-26 DIAGNOSIS — Z91013 Allergy to seafood: Secondary | ICD-10-CM

## 2022-04-26 DIAGNOSIS — J9611 Chronic respiratory failure with hypoxia: Secondary | ICD-10-CM | POA: Diagnosis present

## 2022-04-26 DIAGNOSIS — D62 Acute posthemorrhagic anemia: Secondary | ICD-10-CM | POA: Diagnosis present

## 2022-04-26 DIAGNOSIS — J449 Chronic obstructive pulmonary disease, unspecified: Secondary | ICD-10-CM | POA: Diagnosis present

## 2022-04-26 DIAGNOSIS — Z7982 Long term (current) use of aspirin: Secondary | ICD-10-CM

## 2022-04-26 DIAGNOSIS — E1142 Type 2 diabetes mellitus with diabetic polyneuropathy: Secondary | ICD-10-CM

## 2022-04-26 LAB — POTASSIUM: Potassium: 4.5 mmol/L (ref 3.5–5.1)

## 2022-04-26 LAB — CBC
HCT: 28.8 % — ABNORMAL LOW (ref 39.0–52.0)
Hemoglobin: 9 g/dL — ABNORMAL LOW (ref 13.0–17.0)
MCH: 27.9 pg (ref 26.0–34.0)
MCHC: 31.3 g/dL (ref 30.0–36.0)
MCV: 89.2 fL (ref 80.0–100.0)
Platelets: 176 10*3/uL (ref 150–400)
RBC: 3.23 MIL/uL — ABNORMAL LOW (ref 4.22–5.81)
RDW: 17.2 % — ABNORMAL HIGH (ref 11.5–15.5)
WBC: 9.2 10*3/uL (ref 4.0–10.5)
nRBC: 0 % (ref 0.0–0.2)

## 2022-04-26 LAB — TROPONIN I (HIGH SENSITIVITY)
Troponin I (High Sensitivity): 37 ng/L — ABNORMAL HIGH (ref <18)
Troponin I (High Sensitivity): 40 ng/L — ABNORMAL HIGH (ref <18)

## 2022-04-26 LAB — BASIC METABOLIC PANEL
Anion gap: 10 (ref 5–15)
BUN: 111 mg/dL — ABNORMAL HIGH (ref 6–20)
CO2: 25 mmol/L (ref 22–32)
Calcium: 8.2 mg/dL — ABNORMAL LOW (ref 8.9–10.3)
Chloride: 102 mmol/L (ref 98–111)
Creatinine, Ser: 15.4 mg/dL — ABNORMAL HIGH (ref 0.61–1.24)
GFR, Estimated: 3 mL/min — ABNORMAL LOW (ref >60)
Glucose, Bld: 174 mg/dL — ABNORMAL HIGH (ref 70–99)
Potassium: 6 mmol/L — ABNORMAL HIGH (ref 3.5–5.1)
Sodium: 137 mmol/L (ref 135–145)

## 2022-04-26 LAB — CBG MONITORING, ED: Glucose-Capillary: 163 mg/dL — ABNORMAL HIGH (ref 70–99)

## 2022-04-26 LAB — TYPE AND SCREEN
ABO/RH(D): O POS
Antibody Screen: NEGATIVE

## 2022-04-26 MED ORDER — ISOSORBIDE MONONITRATE ER 30 MG PO TB24
15.0000 mg | ORAL_TABLET | Freq: Every day | ORAL | Status: DC
  Administered 2022-04-27 – 2022-04-28 (×2): 15 mg via ORAL
  Filled 2022-04-26 (×2): qty 1

## 2022-04-26 MED ORDER — ALTEPLASE 2 MG IJ SOLR
2.0000 mg | Freq: Once | INTRAMUSCULAR | Status: DC | PRN
  Filled 2022-04-26: qty 2

## 2022-04-26 MED ORDER — ALLOPURINOL 100 MG PO TABS
100.0000 mg | ORAL_TABLET | Freq: Every day | ORAL | Status: DC
  Administered 2022-04-27 – 2022-04-28 (×2): 100 mg via ORAL
  Filled 2022-04-26 (×2): qty 1

## 2022-04-26 MED ORDER — LIDOCAINE HCL (PF) 1 % IJ SOLN: 5.0000 mL | INTRAMUSCULAR | Status: DC | PRN

## 2022-04-26 MED ORDER — PANTOPRAZOLE SODIUM 40 MG PO TBEC
40.0000 mg | DELAYED_RELEASE_TABLET | Freq: Every day | ORAL | Status: DC
  Administered 2022-04-27 – 2022-04-28 (×2): 40 mg via ORAL
  Filled 2022-04-26 (×2): qty 1

## 2022-04-26 MED ORDER — SODIUM CHLORIDE 0.9 % IV SOLN: 100.0000 mL | INTRAVENOUS | Status: DC | PRN

## 2022-04-26 MED ORDER — LOSARTAN POTASSIUM 25 MG PO TABS
12.5000 mg | ORAL_TABLET | Freq: Every day | ORAL | Status: DC
  Administered 2022-04-27 – 2022-04-28 (×2): 12.5 mg via ORAL
  Filled 2022-04-26: qty 1
  Filled 2022-04-26: qty 0.5

## 2022-04-26 MED ORDER — MORPHINE SULFATE (PF) 4 MG/ML IV SOLN
4.0000 mg | Freq: Once | INTRAVENOUS | Status: AC
  Administered 2022-04-26: 4 mg via INTRAVENOUS
  Filled 2022-04-26: qty 1

## 2022-04-26 MED ORDER — ACETAMINOPHEN 650 MG RE SUPP: 650.0000 mg | Freq: Four times a day (QID) | RECTAL | Status: DC | PRN

## 2022-04-26 MED ORDER — CLOPIDOGREL BISULFATE 75 MG PO TABS: 75.0000 mg | ORAL_TABLET | Freq: Every day | ORAL | Status: DC

## 2022-04-26 MED ORDER — TAMSULOSIN HCL 0.4 MG PO CAPS
0.4000 mg | ORAL_CAPSULE | Freq: Every day | ORAL | Status: DC
  Administered 2022-04-27 – 2022-04-28 (×2): 0.4 mg via ORAL
  Filled 2022-04-26 (×2): qty 1

## 2022-04-26 MED ORDER — HEPARIN SODIUM (PORCINE) 1000 UNIT/ML DIALYSIS: 1000.0000 [IU] | INTRAMUSCULAR | Status: DC | PRN

## 2022-04-26 MED ORDER — ONDANSETRON HCL 4 MG PO TABS: 4.0000 mg | ORAL_TABLET | Freq: Three times a day (TID) | ORAL | Status: DC | PRN

## 2022-04-26 MED ORDER — ATORVASTATIN CALCIUM 20 MG PO TABS
40.0000 mg | ORAL_TABLET | Freq: Every day | ORAL | Status: DC
  Administered 2022-04-27: 40 mg via ORAL
  Filled 2022-04-26: qty 2

## 2022-04-26 MED ORDER — CHLORHEXIDINE GLUCONATE CLOTH 2 % EX PADS
6.0000 | MEDICATED_PAD | Freq: Every day | CUTANEOUS | Status: DC
  Administered 2022-04-28: 6 via TOPICAL
  Filled 2022-04-26: qty 6

## 2022-04-26 MED ORDER — IOHEXOL 300 MG/ML  SOLN
125.0000 mL | Freq: Once | INTRAMUSCULAR | Status: AC | PRN
  Administered 2022-04-26: 125 mL via INTRAVENOUS

## 2022-04-26 MED ORDER — ALBUTEROL SULFATE (2.5 MG/3ML) 0.083% IN NEBU: 3.0000 mL | INHALATION_SOLUTION | RESPIRATORY_TRACT | Status: DC | PRN

## 2022-04-26 MED ORDER — PATIROMER SORBITEX CALCIUM 8.4 G PO PACK
16.8000 g | PACK | Freq: Every day | ORAL | Status: DC
  Administered 2022-04-26: 16.8 g via ORAL
  Filled 2022-04-26 (×4): qty 2

## 2022-04-26 MED ORDER — HEPARIN SODIUM (PORCINE) 5000 UNIT/ML IJ SOLN
5000.0000 [IU] | Freq: Three times a day (TID) | INTRAMUSCULAR | Status: DC
  Administered 2022-04-27 – 2022-04-28 (×4): 5000 [IU] via SUBCUTANEOUS
  Filled 2022-04-26 (×4): qty 1

## 2022-04-26 MED ORDER — PENTAFLUOROPROP-TETRAFLUOROETH EX AERO
INHALATION_SPRAY | CUTANEOUS | Status: AC
  Administered 2022-04-26: 1 via TOPICAL
  Filled 2022-04-26: qty 30

## 2022-04-26 MED ORDER — ASPIRIN EC 81 MG PO TBEC
81.0000 mg | DELAYED_RELEASE_TABLET | Freq: Every morning | ORAL | Status: DC
  Administered 2022-04-27 – 2022-04-28 (×2): 81 mg via ORAL
  Filled 2022-04-26 (×2): qty 1

## 2022-04-26 MED ORDER — PENTAFLUOROPROP-TETRAFLUOROETH EX AERO
1.0000 "application " | INHALATION_SPRAY | CUTANEOUS | Status: DC | PRN
  Filled 2022-04-26 (×2): qty 30

## 2022-04-26 MED ORDER — INSULIN ASPART 100 UNIT/ML IJ SOLN
0.0000 [IU] | Freq: Three times a day (TID) | INTRAMUSCULAR | Status: DC
  Administered 2022-04-27: 2 [IU] via SUBCUTANEOUS
  Administered 2022-04-27 – 2022-04-28 (×2): 3 [IU] via SUBCUTANEOUS
  Filled 2022-04-26 (×3): qty 1

## 2022-04-26 MED ORDER — ONDANSETRON HCL 4 MG/2ML IJ SOLN
4.0000 mg | Freq: Once | INTRAMUSCULAR | Status: AC
  Administered 2022-04-26: 4 mg via INTRAVENOUS
  Filled 2022-04-26: qty 2

## 2022-04-26 MED ORDER — CALCIUM ACETATE (PHOS BINDER) 667 MG PO CAPS
667.0000 mg | ORAL_CAPSULE | Freq: Three times a day (TID) | ORAL | Status: DC
  Administered 2022-04-27 – 2022-04-28 (×3): 667 mg via ORAL
  Filled 2022-04-26 (×4): qty 1

## 2022-04-26 MED ORDER — LIDOCAINE-PRILOCAINE 2.5-2.5 % EX CREA: 1.0000 "application " | TOPICAL_CREAM | CUTANEOUS | Status: DC | PRN

## 2022-04-26 MED ORDER — ACETAMINOPHEN 325 MG PO TABS
650.0000 mg | ORAL_TABLET | Freq: Four times a day (QID) | ORAL | Status: DC | PRN
  Administered 2022-04-27 (×2): 650 mg via ORAL
  Filled 2022-04-26 (×2): qty 2

## 2022-04-26 MED ORDER — UMECLIDINIUM-VILANTEROL 62.5-25 MCG/ACT IN AEPB
1.0000 | INHALATION_SPRAY | Freq: Every day | RESPIRATORY_TRACT | Status: DC
  Administered 2022-04-27: 1 via RESPIRATORY_TRACT
  Filled 2022-04-26 (×2): qty 14

## 2022-04-26 MED ORDER — METOPROLOL SUCCINATE ER 50 MG PO TB24
50.0000 mg | ORAL_TABLET | Freq: Every day | ORAL | Status: DC
  Administered 2022-04-27 – 2022-04-28 (×2): 50 mg via ORAL
  Filled 2022-04-26 (×2): qty 1

## 2022-04-26 MED ORDER — SODIUM CHLORIDE 0.9% FLUSH
3.0000 mL | Freq: Two times a day (BID) | INTRAVENOUS | Status: DC
  Administered 2022-04-27 – 2022-04-28 (×3): 3 mL via INTRAVENOUS

## 2022-04-26 NOTE — ED Triage Notes (Signed)
Patient to ER via POV with GI bleeding x1 week. Patient has pictures of bright red bleeding in toilet. Reports being unable to go to dialysis all week, patient has missed a total of two sessions.  ? ?Patient tachypneic on arrival. Patient on 3L via [place] at baseline.  ?

## 2022-04-26 NOTE — ED Notes (Signed)
Hemodialysis RN at bedside.

## 2022-04-26 NOTE — ED Notes (Signed)
IV team at bedside 

## 2022-04-26 NOTE — Progress Notes (Signed)
[name] ? ?MRN: [id number] ? ?DOB/AGE: Apr 11, 1966 56 y.o. ? ?Primary Care Physician:Cannady, Barbaraann Faster, NP ? ?Admit date: 04/26/2022 ? ?Chief Complaint:  ?Chief Complaint  ?Patient presents with  ? GI Bleeding  ? ? ?S-Pt presented on  04/26/2022 with  ?Chief Complaint  ?Patient presents with  ? GI Bleeding  ?. ? Patient is well-known to our practice.  Patient receives outpatient dialysis treatment at [facility] on Monday Wednesday Friday schedule supervised by Dr. Holley Raring. ? ?Patient is a 56 year old African-American male with a past medical history of ESRD, on hemodialysis, on Monday Wednesday Friday schedule, chronic combined systolic and diastolic CHF, chronic respiratory failure on 2 L of oxygen, COPD, cardiac disease, diabetes mellitus type 2 who came to the ER with chief complaint of GI bleeding. ? ?History of present illness dates back to this Monday when patient started having bright red blood per rectum.  Patient complains of pain in his rectal area.  Patient complains of shortness of breath which was getting progressively worse.  Patient states his bleeding per rectum was worsening and just recently came to the ER today ?Patient also gives a history that he missed his Monday and Wednesday dialysis treatment because of his bleeding issues. ?Upon evaluation in the ER patient was found to have hyperkalemia.  Patient was admitted. ?Nephrology was consulted for comanagement of dialysis patient ? ? ?Medications ? allopurinol  100 mg Oral Daily  ? [START ON 04/27/2022] aspirin EC  81 mg Oral q AM  ? atorvastatin  40 mg Oral QHS  ? [START ON 04/27/2022] calcium acetate  667 mg Oral TID WC  ? [START ON 04/27/2022] Chlorhexidine Gluconate Cloth  6 each Topical Q0600  ? [START ON 04/27/2022] clopidogrel  75 mg Oral Q breakfast  ? heparin  5,000 Units Subcutaneous Q8H  ? [START ON 04/27/2022] insulin aspart  0-15 Units Subcutaneous TID WC  ? isosorbide mononitrate  15 mg Oral Daily  ? losartan  12.5 mg Oral  Daily  ? metoprolol succinate  50 mg Oral Daily  ? pantoprazole  40 mg Oral Daily  ? patiromer  16.8 g Oral Daily  ? sodium chloride flush  3 mL Intravenous Q12H  ? [START ON 04/27/2022] tamsulosin  0.4 mg Oral QPC breakfast  ? [START ON 04/27/2022] umeclidinium-vilanterol  1 puff Inhalation Daily  ? ? ? ? ? ? ? ?VFI:EPPIR from the symptoms mentioned above,there are no other symptoms referable to all systems reviewed. ? ?Physical Exam: ?Vital signs in last 24 hours: ?Temp:  [99.5 ?F (37.5 ?C)] 99.5 ?F (37.5 ?C) (05/11 1621) ?Pulse Rate:  [77-92] 84 (05/11 1930) ?Resp:  [12-25] 19 (05/11 1930) ?BP: (136-160)/(84-109) 160/109 (05/11 1930) ?SpO2:  [91 %-96 %] 91 % (05/11 1930) ?Weight:  [116.1 kg] 116.1 kg (05/11 1620) ?Weight change:  ?  ? ?Intake/Output from previous day: ?No intake/output data recorded. ?No intake/output data recorded. ? ? ?Physical Exam: ? ?General- pt is awake,alert, oriented to time place and person ? ?HEENT head is atraumatic normocephalic nasal cannula in situ ? ?Resp-  decreased breath sound at bases, patient has mild respiratory distress ? ?CVS- S1S2 regular in rate and rhythm ? ?GIT- BS+, soft, Non tender , Non distended, Obese ? ?EXT-1+ LE Edema,  No Cyanosis ? ?Access- LUE AVF ? ?Lab Results: ? ?CBC ? ?Recent Labs  ?  04/26/22 ?1622  ?WBC 9.2  ?HGB 9.0*  ?HCT 28.8*  ?PLT 176  ? ? ?BMET ? ?Recent Labs  ?  04/26/22 ?  1622  ?NA 137  ?K 6.0*  ?CL 102  ?CO2 25  ?GLUCOSE 174*  ?BUN 111*  ?CREATININE 15.40*  ?CALCIUM 8.2*  ? ? ? ? ?Most recent Creatinine trend  ?Lab Results  ?Component Value Date  ? CREATININE 15.40 (H) 04/26/2022  ? CREATININE 11.31 (H) 01/14/2022  ? CREATININE 9.42 (H) 01/13/2022  ?  ? ? ?MICRO ? ? ?No results found for this or any previous visit (from the past 240 hour(s)).  ? ? ? ? ? ?Impression: ? ? ?1)Renal   ? ?End-stage renal disease ?Patient is on hemodialysis ?Patient is a Monday Wednesday Friday schedule ?Patient missed his Monday and Wednesday session ?Patient will need  emergent dialysis today because of hyperkalemia ? ?2)HTN ? ? patient blood pressure is uncontrolled ?Dialysis/fluid should help ? ? ?3)Anemia of chronic disease and blood loss ? ? ?  Latest Ref Rng & Units 04/26/2022  ?  4:22 PM 01/30/2022  ?  9:58 AM 01/29/2022  ? 12:00 AM  ?CBC  ?WBC 4.0 - 10.5 K/uL 9.2      ?Hemoglobin 13.0 - 17.0 g/dL 9.0   10.6   10.7       ?Hematocrit 39.0 - 52.0 % 28.8   34.5     ?Platelets 150 - 400 K/uL 176      ?  ? This result is from an external source.  ?  ? ? ? HGb is just at goal (9--11) ?We will keep patient on Epogen ? ?4) Secondary hyperparathyroidism -CKD Mineral-Bone Disorder ? ? ? ?Lab Results  ?Component Value Date  ? PTH 369 (H) 06/16/2019  ? CALCIUM 8.2 (L) 04/26/2022  ? CAION 1.11 (L) 02/24/2020  ? PHOS 7.5 (H) 01/10/2022  ? ? ?Secondary Hyperparathyroidism present ?Patient phosphorus on the high side ?We will follow ? ? ?5)Rectal bleeding ?Primary team is following ? ? ?6) Electrolytes  ? ? ?  Latest Ref Rng & Units 04/26/2022  ?  4:22 PM 01/29/2022  ? 12:00 AM 01/14/2022  ?  3:26 AM  ?BMP  ?Glucose 70 - 99 mg/dL 174    144    ?BUN 6 - 20 mg/dL 111    52    ?Creatinine 0.61 - 1.24 mg/dL 15.40    11.31    ?Sodium 135 - 145 mmol/L 137    134    ?Potassium 3.5 - 5.1 mmol/L 6.0   4.9      5.4    ?Chloride 98 - 111 mmol/L 102    93    ?CO2 22 - 32 mmol/L 25    27    ?Calcium 8.9 - 10.3 mg/dL 8.2   8.5      8.9    ?  ? This result is from an external source.  ?  ? ?Sodium ?Normonatremic ? ? ?Potassium ? hyperkalemia ?Secondary to nonadherence to renal replacement therapy ?We will dialyze patient today ? ? ? ?7)Acid base ? ? ? ?Co2 at goal ? ? ? ? ?Plan: ? ?Patient had missed his 2 dialysis treatment, patient is in mild respiratory distress, patient is hyperkalemic.  Patient will need emergent dialysis tonight and will need another dialysis session most likely tomorrow as well ? ? ? ? ? ?[name] s Theador Hawthorne ?04/26/2022, 9:46 PM  ?

## 2022-04-26 NOTE — ED Provider Notes (Signed)
? ?[facility] ?Provider Note ? ? ? Event Date/Time  ? First MD Initiated Contact with Patient 04/26/22 1640   ?  (approximate) ? ?History  ? ?Chief Complaint: GI Bleeding ? ?HPI ? ?[name] is a 56 y.o. male patient presents emergency department for multiple complaints.  Patient states he has been experiencing intermittent rectal bleeding which she describes as bright red blood on the toilet paper as well as some rectal pain.  Patient states due to this he has missed all dialysis sessions this week his last dialysis was 6 days ago.  Normally he is on dialysis Monday Wednesday Friday.  Patient states increased shortness of breath.  Patient is tachypneic around 20 to 25 breaths/min.  Baseline 3 L nasal cannula satting in the low 90s. ? ?Physical Exam  ? ?Triage Vital Signs: ?ED Triage Vitals  ?Enc Vitals Group  ?   BP 04/26/22 1619 (!) 147/92  ?   Pulse Rate 04/26/22 1619 92  ?   Resp 04/26/22 1619 (!) 25  ?   Temp 04/26/22 1619 99.5 ?F (37.5 ?C)  ?   Temp Source 04/26/22 1619 Oral  ?   SpO2 04/26/22 1619 93 %  ?   Weight 04/26/22 1620 256 lb (116.1 kg)  ?   Height 04/26/22 1620 '5\' 3"'$  (1.6 m)  ?   Head Circumference --   ?   Peak Flow --   ?   Pain Score 04/26/22 1620 10  ?   Pain Loc --   ?   Pain Edu? --   ?   Excl. in [place]? --   ? ? ?Most recent vital signs: ?Vitals:  ? 04/26/22 1621 04/26/22 1724  ?BP: (!) 147/92 137/90  ?Pulse: 88 84  ?Resp: (!) 24 16  ?Temp: 99.5 ?F (37.5 ?C)   ?SpO2: 93% 96%  ? ? ?General: Awake, no distress.  ?CV:  Good peripheral perfusion.  Regular rate and rhythm  ?Resp:  Normal effort.  Equal breath sounds bilaterally.  ?Abd:  No distention.  Soft, nontender.  No rebound or guarding.  Rectal examination does show small hemorrhoid as well as larger skin tag.  Possible cause of the bleed. ? ? ? ?ED Results / Procedures / Treatments  ? ?EKG ? ?EKG viewed and interpreted by myself shows a normal sinus rhythm 89 bpm with a narrow QRS, normal axis, normal  intervals, no concerning ST changes.  No T wave peaking. ? ?RADIOLOGY ? ?Chest x-ray consistent with pulmonary edema on my evaluation. ?Radiology is read the chest x-ray is cardiomegaly with vascular congestion and pulmonary edema. ? ? ?MEDICATIONS ORDERED IN ED: ?Medications  ?morphine (PF) 4 MG/ML injection 4 mg (has no administration in time range)  ?ondansetron (ZOFRAN) injection 4 mg (has no administration in time range)  ? ? ? ?IMPRESSION / MDM / ASSESSMENT AND PLAN / ED COURSE  ?I reviewed the triage vital signs and the nursing notes. ? ?Patient presents emergency department for rectal bleeding, rectal pain, missed dialysis and shortness of breath.  Patient is tachypneic, satting in the low 90s on 3 L which is his baseline O2 requirement.  Reassuring physical exam with clear lung sounds besides mild tachypnea.  Patient's chest x-ray does appear to show pulmonary edema.  We will check labs.  Patient will likely require admission for dialysis given the shortness of breath and pulmonary edema on exam.  Lab work pending.  Differential would include electrolyte or metabolic abnormality such as increased  potassium, anemia. ? ?Patient's labs have resulted showing a hemoglobin of 9.0 which is slightly less than the patient's baseline.  Patient is hyperkalemic at 6.0 we will dose Veltassa.  Patient's troponin is mildly elevated at 37 however he is a hemodialysis patient.  Patient's chest x-ray consistent with pulmonary edema.  I spoke to [facility] of nephrology.  We will admit to the hospital service for further work-up and treatment and arranging of dialysis electrolytes tomorrow. ? ?CRITICAL CARE ?Performed by: Harvest Dark ? ? ?Total critical care time: 30 minutes ? ?Critical care time was exclusive of separately billable procedures and treating other patients. ? ?Critical care was necessary to treat or prevent imminent or life-threatening deterioration. ? ?Critical care was time spent personally by me on the  following activities: development of treatment plan with patient and/or surrogate as well as nursing, discussions with consultants, evaluation of patient's response to treatment, examination of patient, obtaining history from patient or surrogate, ordering and performing treatments and interventions, ordering and review of laboratory studies, ordering and review of radiographic studies, pulse oximetry and re-evaluation of patient's condition. ? ? ?FINAL CLINICAL IMPRESSION(S) / ED DIAGNOSES  ? ?Rectal bleeding ?Fluid overload ?Hyperkalemia ? ? ?Note:  This document was prepared using Dragon voice recognition software and may include unintentional dictation errors. ?  ?Harvest Dark, MD ?04/26/22 1948 ? ?

## 2022-04-26 NOTE — ED Notes (Signed)
Pt states he had a colonoscopy a month ago and had 2 polyps removed, pathology resulted normal. He has had to strain for bowel movements and when he is successful, his stools are small and hard and he has bright red blood coming from his rectum with 10/10 rectal pain constant. He has not left home for a week due to his discomfort and has missed 2 dialysis appointments so far. He appears uncomfortable but is not in acute distress. A/O x 4 and wife is at bedside.  ?

## 2022-04-26 NOTE — ED Notes (Signed)
Patient transported to x-ray. ?

## 2022-04-26 NOTE — ED Notes (Signed)
Pt returned from CT °

## 2022-04-26 NOTE — H&P (Addendum)
?History and Physical  ? ? ?Patient: [name] QRF:[id number] DOB: 1966-02-05 ?DOA: 04/26/2022 ?DOS: the patient was seen and examined on 04/27/2022 ?PCP: Venita Lick, NP  ?Patient coming from: Home ? ?Chief Complaint:  ?Chief Complaint  ?Patient presents with  ? GI Bleeding  ? ?HPI: [name] is a 56 y.o. male with medical history significant of chronic combined systolic and diastolic heart failure, chronic respiratory failure on 3 L from COPD, CAD, diabetes mellitus type 2, end-stage renal disease on hemodialysis.  Patient also has been experiencing intermittent rectal bleeding and rectal pain blood on the toilet paper.  Patient has missed dialysis normally Monday Wednesday  Friday. ?Patient is showing me pictures of the blood in his commode on his phone.  Patient has had significant bleeding and he intermittently has rectal pain as well. ?Patient states he has missed his dialysis because of his diarrhea and tenesmus every time he gets up he wants to have a bowel movement which is bloody. ?In ed pt is on oxygen and has received veltassa for his hyperkalemia.  ? ?Review of Systems  ?Respiratory:  Positive for shortness of breath.   ?Gastrointestinal:  Positive for abdominal pain, blood in stool and nausea.  ?All other systems reviewed and are negative. ? ?Past Medical History:  ?Diagnosis Date  ? Anemia of chronic disease   ? Bell's palsy   ? Cataract   ? Chronic combined systolic (congestive) and diastolic (congestive) heart failure (HCC)   ? Chronic respiratory failure with hypoxia (HCC)   ? 3 lpm continuous  ? COPD (chronic obstructive pulmonary disease) ([place])   ? Coronary artery disease   ? Diabetes mellitus without complication ([place])   ? Dyspnea   ? Wheezing  ? ESRD on hemodialysis ([place])   ? GERD (gastroesophageal reflux disease)   ? Gout   ? HOH (hard of hearing)   ? mild  ? Hypercholesterolemia   ? Hypertension   ? NSVT (nonsustained ventricular tachycardia) ([place])   ? Obstructive sleep  apnea   ? CPAP, O2 use continuosly at 3LPM  ? Orthopnea   ? Pneumonia   ? In Past X2 most recent 1 yr ago  ? Psoriasis   ? Pulmonary HTN ([place])   ? Ventricular fibrillation ([place])   ? ?Past Surgical History:  ?Procedure Laterality Date  ? A/V FISTULAGRAM Right 05/02/2021  ? Procedure: A/V FISTULAGRAM;  Surgeon: Katha Cabal, MD;  Location: [place] [facility];  Service: Cardiovascular;  Laterality: Right;  ? AV FISTULA PLACEMENT Right 11/20/2019  ? Procedure: ARTERIOVENOUS (AV) FISTULA CREATION ( BRACHIAL CEPHALIC );  Surgeon: Katha Cabal, MD;  Location: [facility];  Service: Vascular;  Laterality: Right;  ? CATARACT EXTRACTION W/PHACO Left 12/17/2019  ? Procedure: CATARACT EXTRACTION PHACO AND INTRAOCULAR LENS PLACEMENT ([place]) LEFT ISTENT INJ DIABETIC;  Surgeon: Eulogio Bear, MD;  Location: [facility];  Service: Ophthalmology;  Laterality: Left;  [place] 00:33.2 ?CDE 2.96 ?Fluid Pack Lot # [id number]  ? CATARACT EXTRACTION W/PHACO Right 02/24/2020  ? Procedure: CATARACT EXTRACTION PHACO AND INTRAOCULAR LENS PLACEMENT ([facility]) RIGHT DIABETIC ISTENT INJ;  Surgeon: Eulogio Bear, MD;  Location: [facility];  Service: Ophthalmology;  Laterality: Right;  [place] 00:37.3 ?CDE 2.88 ?Fluid Pack Lot # [id number]  ? COLONOSCOPY WITH PROPOFOL N/A 03/06/2022  ? Procedure: COLONOSCOPY WITH PROPOFOL;  Surgeon: Jonathon Bellows, MD;  Location[facility];  Service: Gastroenterology;  Laterality: N/A;  ARRIVAL IS 6:45 AM - TRANSPORTATION IS ACTA. PATIENT CALLED 02/28/2022.  ?  CORONARY ANGIOPLASTY WITH STENT PLACEMENT    ? stent placement  ? CORONARY STENT INTERVENTION N/A 08/28/2021  ? Procedure: CORONARY STENT INTERVENTION;  Surgeon: Nelva Bush, MD;  Location: [place] [facility];  Service: Cardiovascular;  Laterality: N/A;  ? DIALYSIS/PERMA CATHETER INSERTION N/A 06/22/2019  ? Procedure: DIALYSIS/PERMA CATHETER INSERTION;  Surgeon: Algernon Huxley, MD;  Location: [place] [facility];  Service: Cardiovascular;  Laterality: N/A;  ?  DIALYSIS/PERMA CATHETER REMOVAL N/A 05/31/2020  ? Procedure: DIALYSIS/PERMA CATHETER REMOVAL;  Surgeon: Katha Cabal, MD;  Location: [place] [facility];  Service: Cardiovascular;  Laterality: N/A;  ? EYE SURGERY    ? INSERTION OF AHMED VALVE Left 12/17/2019  ? Procedure: INSERTION OF iSTENT;  Surgeon: Eulogio Bear, MD;  Location: [facility];  Service: Ophthalmology;  Laterality: Left;  ? INTRAVASCULAR IMAGING/OCT N/A 08/28/2021  ? Procedure: INTRAVASCULAR IMAGING/OCT;  Surgeon: Nelva Bush, MD;  Location: [place] [facility];  Service: Cardiovascular;  Laterality: N/A;  ? LEFT HEART CATH AND CORONARY ANGIOGRAPHY N/A 08/08/2021  ? Procedure: LEFT HEART CATH AND CORONARY ANGIOGRAPHY;  Surgeon: Nelva Bush, MD;  Location: [place] [facility];  Service: Cardiovascular;  Laterality: N/A;  ? ?Social History:  reports that he has never smoked. He has never used smokeless tobacco. He reports that he does not currently use alcohol after a past usage of about 1.0 standard drink per week. He reports that he does not currently use drugs after having used the following drugs: Marijuana. ? ?Allergies  ?Allergen Reactions  ? Shellfish Allergy Anaphylaxis  ?  Face and throat swelling, difficulty breathing ?Allergy can be triggered by touching (contact)  ? Betadine [Povidone Iodine] Rash  ? Povidone-Iodine Rash  ? ? ?Family History  ?Problem Relation Age of Onset  ? Heart failure Mother   ? Kidney failure Brother   ? ? ?Prior to Admission medications   ?Medication Sig Start Date End Date Taking? Authorizing Provider  ?Accu-Chek Softclix Lancets lancets Use to check blood sugars 2-3 times daily with goals = <130 fasting in morning and <180 two hours after eating.  Bring blood sugar log to appointments. 03/02/22   Cannady, Henrine Screws T, NP  ?albuterol (PROVENTIL HFA;VENTOLIN HFA) 108 (90 Base) MCG/ACT inhaler Inhale 2 puffs into the lungs every 4 (four) hours as needed for wheezing or shortness of breath. 01/20/19    Doles-Johnson, Teah, NP  ?allopurinol (ZYLOPRIM) 100 MG tablet Take 100 mg by mouth daily. 07/21/21   [provider]  ?aspirin EC 81 MG tablet Take 81 mg by mouth in the morning. Swallow whole.    [provider]  ?atorvastatin (LIPITOR) 40 MG tablet Take 1 tablet (40 mg total) by mouth at bedtime. 08/17/21   Rise Mu, PA-C  ?benzonatate (TESSALON) 200 MG capsule Take 1 capsule (200 mg total) by mouth 3 (three) times daily as needed for cough. 03/15/22   Tanda Rockers, MD  ?Blood Glucose Monitoring Suppl (ACCU-CHEK GUIDE ME) w/Device KIT Use to check blood sugars 2-3 times daily with goals = <130 fasting in morning and <180 two hours after eating.  Bring blood sugar log to appointments. 03/02/22   Marnee Guarneri T, NP  ?calcium acetate (PHOSLO) 667 MG capsule Take 667 mg by mouth 3 (three) times daily with meals. 01/17/21   [provider]  ?Cholecalciferol (D3-1000) 25 MCG (1000 UT) tablet Take 2 tablets (2,000 Units total) by mouth daily. 01/20/19   Doles-Johnson, Teah, NP  ?clopidogrel (PLAVIX) 75 MG tablet Take  1 tablet (75 mg total) by mouth daily with breakfast. 04/24/22   Kate Sable, MD  ?dapagliflozin propanediol (FARXIGA) 10 MG TABS tablet Take 1 tablet (10 mg total) by mouth daily before breakfast. 04/19/22   Kate Sable, MD  ?docusate sodium (COLACE) 100 MG capsule Take 1 capsule (100 mg total) by mouth daily. 09/05/21   Rise Mu, PA-C  ?gabapentin (NEURONTIN) 300 MG capsule Take 300 mg by mouth at bedtime. 06/15/21   [provider]  ?glimepiride (AMARYL) 4 MG tablet Take 4 mg by mouth daily with breakfast. 11/27/19   [provider]  ?glucose blood test strip Use to check blood sugars 2-3 times daily with goals = <130 fasting in morning and <180 two hours after eating.  Bring blood sugar log to appointments. 03/02/22   Cannady, Henrine Screws T, NP  ?insulin degludec (TRESIBA FLEXTOUCH) 100 UNIT/ML FlexTouch Pen Inject 20 Units into the skin at bedtime.  08/30/20   Lavera Guise, MD  ?isosorbide mononitrate (IMDUR) 30 MG 24 hr tablet Take 0.5 tablets (15 mg total) by mouth daily. 04/20/22 07/19/22  Kate Sable, MD  ?losartan (COZAAR) 25 MG tablet Take 0.5

## 2022-04-26 NOTE — ED Notes (Signed)
Obtained consent for hemodialysis ?

## 2022-04-27 ENCOUNTER — Other Ambulatory Visit: Payer: Self-pay

## 2022-04-27 ENCOUNTER — Encounter: Payer: Self-pay | Admitting: Internal Medicine

## 2022-04-27 DIAGNOSIS — Z992 Dependence on renal dialysis: Secondary | ICD-10-CM | POA: Diagnosis not present

## 2022-04-27 DIAGNOSIS — Z9981 Dependence on supplemental oxygen: Secondary | ICD-10-CM | POA: Diagnosis not present

## 2022-04-27 DIAGNOSIS — Z91158 Patient's noncompliance with renal dialysis for other reason: Secondary | ICD-10-CM | POA: Diagnosis not present

## 2022-04-27 DIAGNOSIS — K625 Hemorrhage of anus and rectum: Secondary | ICD-10-CM | POA: Diagnosis present

## 2022-04-27 DIAGNOSIS — Z91013 Allergy to seafood: Secondary | ICD-10-CM | POA: Diagnosis not present

## 2022-04-27 DIAGNOSIS — Z955 Presence of coronary angioplasty implant and graft: Secondary | ICD-10-CM | POA: Diagnosis not present

## 2022-04-27 DIAGNOSIS — D631 Anemia in chronic kidney disease: Secondary | ICD-10-CM | POA: Diagnosis present

## 2022-04-27 DIAGNOSIS — J449 Chronic obstructive pulmonary disease, unspecified: Secondary | ICD-10-CM | POA: Diagnosis present

## 2022-04-27 DIAGNOSIS — D62 Acute posthemorrhagic anemia: Secondary | ICD-10-CM | POA: Diagnosis present

## 2022-04-27 DIAGNOSIS — D849 Immunodeficiency, unspecified: Secondary | ICD-10-CM | POA: Diagnosis present

## 2022-04-27 DIAGNOSIS — Z6841 Body Mass Index (BMI) 40.0 and over, adult: Secondary | ICD-10-CM | POA: Diagnosis not present

## 2022-04-27 DIAGNOSIS — E1122 Type 2 diabetes mellitus with diabetic chronic kidney disease: Secondary | ICD-10-CM | POA: Diagnosis present

## 2022-04-27 DIAGNOSIS — I5042 Chronic combined systolic (congestive) and diastolic (congestive) heart failure: Secondary | ICD-10-CM | POA: Diagnosis present

## 2022-04-27 DIAGNOSIS — I132 Hypertensive heart and chronic kidney disease with heart failure and with stage 5 chronic kidney disease, or end stage renal disease: Secondary | ICD-10-CM | POA: Diagnosis present

## 2022-04-27 DIAGNOSIS — I272 Pulmonary hypertension, unspecified: Secondary | ICD-10-CM | POA: Diagnosis present

## 2022-04-27 DIAGNOSIS — I2510 Atherosclerotic heart disease of native coronary artery without angina pectoris: Secondary | ICD-10-CM | POA: Diagnosis present

## 2022-04-27 DIAGNOSIS — E1165 Type 2 diabetes mellitus with hyperglycemia: Secondary | ICD-10-CM | POA: Diagnosis present

## 2022-04-27 DIAGNOSIS — N2581 Secondary hyperparathyroidism of renal origin: Secondary | ICD-10-CM | POA: Diagnosis present

## 2022-04-27 DIAGNOSIS — J9611 Chronic respiratory failure with hypoxia: Secondary | ICD-10-CM | POA: Diagnosis present

## 2022-04-27 DIAGNOSIS — E1142 Type 2 diabetes mellitus with diabetic polyneuropathy: Secondary | ICD-10-CM | POA: Diagnosis present

## 2022-04-27 DIAGNOSIS — K602 Anal fissure, unspecified: Secondary | ICD-10-CM | POA: Diagnosis not present

## 2022-04-27 DIAGNOSIS — G4733 Obstructive sleep apnea (adult) (pediatric): Secondary | ICD-10-CM | POA: Diagnosis present

## 2022-04-27 DIAGNOSIS — N186 End stage renal disease: Secondary | ICD-10-CM | POA: Diagnosis present

## 2022-04-27 DIAGNOSIS — I429 Cardiomyopathy, unspecified: Secondary | ICD-10-CM | POA: Diagnosis present

## 2022-04-27 LAB — CBG MONITORING, ED
Glucose-Capillary: 179 mg/dL — ABNORMAL HIGH (ref 70–99)
Glucose-Capillary: 140 mg/dL — ABNORMAL HIGH (ref 70–99)

## 2022-04-27 LAB — COMPREHENSIVE METABOLIC PANEL
ALT: 29 U/L (ref 0–44)
AST: 15 U/L (ref 15–41)
Albumin: 3.4 g/dL — ABNORMAL LOW (ref 3.5–5.0)
Alkaline Phosphatase: 187 U/L — ABNORMAL HIGH (ref 38–126)
Anion gap: 9 (ref 5–15)
BUN: 66 mg/dL — ABNORMAL HIGH (ref 6–20)
CO2: 29 mmol/L (ref 22–32)
Calcium: 8.1 mg/dL — ABNORMAL LOW (ref 8.9–10.3)
Chloride: 100 mmol/L (ref 98–111)
Creatinine, Ser: 10.22 mg/dL — ABNORMAL HIGH (ref 0.61–1.24)
GFR, Estimated: 6 mL/min — ABNORMAL LOW (ref >60)
Glucose, Bld: 187 mg/dL — ABNORMAL HIGH (ref 70–99)
Potassium: 4.2 mmol/L (ref 3.5–5.1)
Sodium: 138 mmol/L (ref 135–145)
Total Bilirubin: 0.7 mg/dL (ref 0.3–1.2)
Total Protein: 7.1 g/dL (ref 6.5–8.1)

## 2022-04-27 LAB — CBC
HCT: 27.6 % — ABNORMAL LOW (ref 39.0–52.0)
Hemoglobin: 8.6 g/dL — ABNORMAL LOW (ref 13.0–17.0)
MCH: 28.3 pg (ref 26.0–34.0)
MCHC: 31.2 g/dL (ref 30.0–36.0)
MCV: 90.8 fL (ref 80.0–100.0)
Platelets: 165 10*3/uL (ref 150–400)
RBC: 3.04 MIL/uL — ABNORMAL LOW (ref 4.22–5.81)
RDW: 17.2 % — ABNORMAL HIGH (ref 11.5–15.5)
WBC: 8.7 10*3/uL (ref 4.0–10.5)
nRBC: 0 % (ref 0.0–0.2)

## 2022-04-27 LAB — GLUCOSE, CAPILLARY
Glucose-Capillary: 141 mg/dL — ABNORMAL HIGH (ref 70–99)
Glucose-Capillary: 181 mg/dL — ABNORMAL HIGH (ref 70–99)

## 2022-04-27 LAB — HEPATITIS PANEL, ACUTE
HCV Ab: NONREACTIVE
Hep A IgM: NONREACTIVE
Hep B C IgM: NONREACTIVE
Hepatitis B Surface Ag: NONREACTIVE

## 2022-04-27 LAB — PHOSPHORUS: Phosphorus: 3.6 mg/dL (ref 2.5–4.6)

## 2022-04-27 LAB — HEPATITIS B SURFACE ANTIGEN: Hepatitis B Surface Ag: NONREACTIVE

## 2022-04-27 MED ORDER — DOCUSATE SODIUM 100 MG PO CAPS
100.0000 mg | ORAL_CAPSULE | Freq: Two times a day (BID) | ORAL | Status: DC
  Administered 2022-04-27 – 2022-04-28 (×2): 100 mg via ORAL
  Filled 2022-04-27 (×2): qty 1

## 2022-04-27 MED ORDER — DIPHENHYDRAMINE HCL 25 MG PO CAPS
25.0000 mg | ORAL_CAPSULE | Freq: Once | ORAL | Status: AC
  Administered 2022-04-27: 25 mg via ORAL
  Filled 2022-04-27: qty 1

## 2022-04-27 MED ORDER — POLYETHYLENE GLYCOL 3350 17 G PO PACK
17.0000 g | PACK | Freq: Every day | ORAL | Status: DC
  Administered 2022-04-28: 17 g via ORAL
  Filled 2022-04-27: qty 1

## 2022-04-27 MED ORDER — HYDROXYZINE HCL 10 MG PO TABS
10.0000 mg | ORAL_TABLET | Freq: Four times a day (QID) | ORAL | Status: DC | PRN
  Administered 2022-04-27: 10 mg via ORAL
  Filled 2022-04-27 (×2): qty 1

## 2022-04-27 MED ORDER — OXYCODONE HCL 5 MG PO TABS
5.0000 mg | ORAL_TABLET | Freq: Three times a day (TID) | ORAL | Status: DC | PRN
  Administered 2022-04-27 – 2022-04-28 (×2): 5 mg via ORAL
  Filled 2022-04-27 (×2): qty 1

## 2022-04-27 MED ORDER — LIDOCAINE HCL URETHRAL/MUCOSAL 2 % EX GEL
1.0000 | Freq: Four times a day (QID) | CUTANEOUS | Status: DC
Start: 2022-04-27 — End: 2022-04-28
  Administered 2022-04-27 – 2022-04-28 (×3): 1
  Filled 2022-04-27 (×5): qty 5

## 2022-04-27 NOTE — ED Notes (Signed)
Pt reports severe pain in his buttocks. He declines Tylenol. Secure chat sent to MD regarding same.  ?

## 2022-04-27 NOTE — ED Notes (Signed)
Pt repositioned in bed, lying on L side and pillow placed between legs to attempt to make him more comfortable. Still awaiting response from MD regarding pain control.  ?

## 2022-04-27 NOTE — ED Notes (Signed)
Report called to [facility] in dialysis.  ?

## 2022-04-27 NOTE — ED Notes (Signed)
Secure chat sent to MD regarding order for plavix and possible need to hold d/t GI bleeding. Awaiting response.  ?

## 2022-04-27 NOTE — ED Notes (Signed)
Pt resting in bed. Appears to be sleeping at this time. Chest is rising and falling symmetrically. No acute distress noted.  ?

## 2022-04-27 NOTE — Progress Notes (Addendum)
? ? ? ?Progress Note  ? ? ?[name]  TKW:[id number] DOB: 02/12/1966  DOA: 04/26/2022 ?PCP: Venita Lick, NP  ? ? ? ? ?Brief Narrative:  ? ? ?Medical records reviewed and are as summarized below: ? ?[name] is a 56 y.o. male with medical history significant for type II DM, ESRD on hemodialysis, CAD s/p stent to proximal LAD on 08/28/2021, chronic combined systolic and diastolic CHF, chronic hypoxic respiratory failure on 3 L/min oxygen, COPD, OSA, who presented to the hospital because of rectal pain and bleeding of about 4 days duration.  He noticed bright red blood from the rectum about 4 days prior to admission and this has been associated with severe rectal pain.  He missed hemodialysis on Monday and Wednesday because of rectal pain and bleeding.  Symptoms progressed so he presented to the ED.  He was on aspirin and Plavix until about 3 weeks prior to admission when he was advised by his pulmonologist to come off Plavix.  No history of prior GI bleed. ? ? ? ? ? ? ?Assessment/Plan:  ? ?Principal Problem: ?  Rectal bleeding ?Active Problems: ?  Abdominal pain ?  Chronic combined systolic (congestive) and diastolic (congestive) heart failure (HCC) ?  CAD (coronary artery disease) ?  ESRD (end stage renal disease) on dialysis [facility]) ?  Type 2 diabetes mellitus with hyperglycemia, with long-term current use of insulin ([place]) ?  Obstructive sleep apnea ?  COPD (chronic obstructive pulmonary disease) ([place]) ? ? ? ?Body mass index is 49.44 kg/m?.  (Morbid obesity) ? ?Rectal bleeding or rectal pain attributed to rectal fissure with constipation: Recommendations from gastroenterologist noted.  Treat with laxatives, sitz bath twice daily for 10 days, viscous lidocaine jelly per rectum, Rectiv. ?Recent colonoscopy in March 2023 showed polyps.  No plan for repeat endoscopy. ? ?Acute blood loss anemia: Hemoglobin is down to 8.6.  No indication for blood transfusion at this time.  Monitor H&H.  Baseline  hemoglobin around 10.5-12. ? ?ESRD with fluid overload, hyperkalemia: Potassium has normalized with hemodialysis.  Plan for repeat hemodialysis today.  Follow-up with nephrologist. ? ?Chronic combined systolic and diastolic CHF: Fluid management with dialysis.  Follow-up with nephrologist. ? ?CAD s/p stent to proximal LAD in September 2022: Patient said Plavix had been discontinued about 3 weeks prior to admission.  Continue aspirin.  Patient follow-up with cardiologist. ? ?Type II DM: NovoLog as needed for hyperglycemia. ? ? ?Diet Order   ? ?       ?  Diet renal/carb modified with fluid restriction Diet-HS Snack? Nothing; Fluid restriction: 1200 mL Fluid; Room service appropriate? Yes; Fluid consistency: Thin  Diet effective now       ?  ? ?  ?  ? ?  ? ? ? ? ? ? ? ? ? ? ? ?Consultants: ?Nephrologist, gastroenterologist ? ?Procedures: ?None ? ? ? ?Medications:  ? ? allopurinol  100 mg Oral Daily  ? aspirin EC  81 mg Oral q AM  ? atorvastatin  40 mg Oral QHS  ? calcium acetate  667 mg Oral TID WC  ? Chlorhexidine Gluconate Cloth  6 each Topical Q0600  ? docusate sodium  100 mg Oral BID  ? heparin  5,000 Units Subcutaneous Q8H  ? insulin aspart  0-15 Units Subcutaneous TID WC  ? isosorbide mononitrate  15 mg Oral Daily  ? lidocaine  1 application. Other Q6H  ? losartan  12.5 mg Oral Daily  ? metoprolol succinate  50 mg Oral Daily  ? pantoprazole  40 mg Oral Daily  ? patiromer  16.8 g Oral Daily  ? polyethylene glycol  17 g Oral Daily  ? sodium chloride flush  3 mL Intravenous Q12H  ? tamsulosin  0.4 mg Oral QPC breakfast  ? umeclidinium-vilanterol  1 puff Inhalation Daily  ? ?Continuous Infusions: ? sodium chloride    ? sodium chloride    ? ? ? ?Anti-infectives (From admission, onward)  ? ? None  ? ?  ? ? ? ? ? ? ? ? ? ?Family Communication/Anticipated D/C date and plan/Code Status  ? ?DVT prophylaxis: heparin injection 5,000 Units Start: 04/26/22 2200 ? ? ?  Code Status: Full Code ? ?Family Communication:  None ?Disposition Plan: Plan to discharge home in 1 to 2 days ? ? ?Status is: Observation ?The patient will require care spanning > 2 midnights and should be moved to inpatient because: Rectal pain and bleeding ? ? ? ? ? ? ?Subjective:  ? ?Interval events noted.  He complains of rectal pain and rectal bleeding.  He requested something stronger for rectal pain besides Tylenol. ? ?Objective:  ? ? ?Vitals:  ? 04/27/22 1413 04/27/22 1415 04/27/22 1430 04/27/22 1445  ?BP:  123/75 98/85 (!) 93/55  ?Pulse: 73 73 77 81  ?Resp:   (!) 22 14  ?Temp:      ?TempSrc:      ?SpO2: 92% (!) 89% 94% 100%  ?Weight:      ?Height:      ? ?No data found. ? ? ?Intake/Output Summary (Last 24 hours) at 04/27/2022 1500 ?Last data filed at 04/27/2022 0130 ?Gross per 24 hour  ?Intake --  ?Output 2000 ml  ?Net -2000 ml  ? ?Filed Weights  ? 04/26/22 2206 04/27/22 0145 04/27/22 1357  ?Weight: 126.1 kg 124.4 kg 126.6 kg  ? ? ?Exam: ? ?GEN: NAD ?SKIN: Warm and dry ?EYES: No pallor or icterus ?ENT: MMM ?CV: RRR ?PULM: CTA B ?ABD: soft, obese, NT, +BS ?CNS: AAO x 3, non focal ?EXT: Bilateral leg edema.  No tenderness ?RECTAL: On inspection, patient has a skin tag under right buttock around perianal region.  No visible hemorrhoids noted.  Digital rectal exam was deferred ? ? ?  ? ? ?Data Reviewed:  ? ?I have personally reviewed following labs and imaging studies: ? ?Labs: ?Labs show the following:  ? ?Basic Metabolic Panel: ?Recent Labs  ?Lab 04/26/22 ?1622 04/26/22 ?2328 04/27/22 ?0449  ?NA 137  --  138  ?K 6.0* 4.5 4.2  ?CL 102  --  100  ?CO2 25  --  29  ?GLUCOSE 174*  --  187*  ?BUN 111*  --  66*  ?CREATININE 15.40*  --  10.22*  ?CALCIUM 8.2*  --  8.1*  ?PHOS  --   --  3.6  ? ?GFR ?Estimated Creatinine Clearance: 10 mL/min (A) (by C-G formula based on SCr of 10.22 mg/dL (H)). ?Liver Function Tests: ?Recent Labs  ?Lab 04/27/22 ?3709  ?AST 15  ?ALT 29  ?ALKPHOS 187*  ?BILITOT 0.7  ?PROT 7.1  ?ALBUMIN 3.4*  ? ?No results for input(s): LIPASE, AMYLASE in  the last 168 hours. ?No results for input(s): AMMONIA in the last 168 hours. ?Coagulation profile ?No results for input(s): INR, PROTIME in the last 168 hours. ? ?CBC: ?Recent Labs  ?Lab 04/26/22 ?1622 04/27/22 ?0449  ?WBC 9.2 8.7  ?HGB 9.0* 8.6*  ?HCT 28.8* 27.6*  ?MCV 89.2 90.8  ?PLT 176 165  ? ?  Cardiac Enzymes: ?No results for input(s): CKTOTAL, CKMB, CKMBINDEX, TROPONINI in the last 168 hours. ?BNP (last 3 results) ?No results for input(s): PROBNP in the last 8760 hours. ?CBG: ?Recent Labs  ?Lab 04/26/22 ?2327 04/27/22 ?1610 04/27/22 ?1143  ?GLUCAP 163* 179* 140*  ? ?D-Dimer: ?No results for input(s): DDIMER in the last 72 hours. ?Hgb A1c: ?No results for input(s): HGBA1C in the last 72 hours. ?Lipid Profile: ?No results for input(s): CHOL, HDL, LDLCALC, TRIG, CHOLHDL, LDLDIRECT in the last 72 hours. ?Thyroid function studies: ?No results for input(s): TSH, T4TOTAL, T3FREE, THYROIDAB in the last 72 hours. ? ?Invalid input(s): FREET3 ?Anemia work up: ?No results for input(s): VITAMINB12, FOLATE, FERRITIN, TIBC, IRON, RETICCTPCT in the last 72 hours. ?Sepsis Labs: ?Recent Labs  ?Lab 04/26/22 ?1622 04/27/22 ?0449  ?WBC 9.2 8.7  ? ? ?Microbiology ?No results found for this or any previous visit (from the past 240 hour(s)). ? ?Procedures and diagnostic studies: ? ?DG Chest 2 View ? ?Result Date: 04/26/2022 ?CLINICAL DATA:  Shortness of breath EXAM: CHEST - 2 VIEW COMPARISON:  05/11/2021 FINDINGS: Cardiomegaly with vascular congestion and pulmonary edema. No pleural effusion. No consolidation or pneumothorax. IMPRESSION: Cardiomegaly with vascular congestion and pulmonary edema Electronically Signed   By: Donavan Foil M.D.   On: 04/26/2022 16:56  ? ?CT ABDOMEN PELVIS W CONTRAST ? ?Result Date: 04/26/2022 ?CLINICAL DATA:  Abdominal pain, acute, nonlocalized. Bright red blood per rectum EXAM: CT ABDOMEN AND PELVIS WITH CONTRAST TECHNIQUE: Multidetector CT imaging of the abdomen and pelvis was performed using the standard  protocol following bolus administration of intravenous contrast. RADIATION DOSE REDUCTION: This exam was performed according to the departmental dose-optimization program which includes automated exposure control, a

## 2022-04-27 NOTE — Progress Notes (Signed)
Hemodialysis Post Treatment Note ? ?27 Apr 2022 ? ?Access: ?RUA AVF  ? ?Treatment Time: ?3 Hours ? ?UF Removed: ?2001 ML ? ?Next Scheduled Treatment: ?TBD ? ?Note: ?  Patient completes an unscheduled 3-hour hemodialysis treatment. RUA AVF with bruit, thrill, oozing once bandage removed, no bleeding noted during treatment. Unable to maintain prescribed blood flow rate during treatment due to high venous pressures, attempted to readjust needle without success. Minimal post treatment bleeding. Patient uncomfortable on ED stretcher, otherwise does not endorse any discomfort. Patient has room assignment will transport from hemodialysis suite to room 206.  ? ?

## 2022-04-27 NOTE — ED Notes (Signed)
Pt c/o of itching all over. States he has hx of psoriasis. B Morrison notified to see if medication can be ordered to relieve itching. ?

## 2022-04-27 NOTE — Progress Notes (Signed)
Hemodialysis Post Treatment Note: ? ?Tx date:04/26/2022 ?Tx time:3 hours ?Access: tight AVF ?UF Removed: 2L ? ?Note: ?Patient has a high potassium of 6. ?Patient is alert and no complaints. ?22:29 HD started.  ?23:29 potassium bath switched to 2K from 1K as per order.  Also, STAT potassium blood draw done. ?24:00 potassium result is 4.5. Dr Theador Hawthorne made aware. ?12:15 Arterial pressure bottoming down, BFR lowered to 350 from 400. ?1:30 am HD completed, tolerated well. No complications.  ? ? ? ? ? ? ? ? ?  ?

## 2022-04-27 NOTE — ED Notes (Signed)
Dialysis RN reports completion of hemodialysis: 3 hours, 2L fluid removed, pain reported as 3/10 afterwards.  ?

## 2022-04-27 NOTE — Consult Note (Signed)
? ? Inpatient Consultation ?  ?Patient ID: [name] is a 56 y.o. male. ? ?Requesting Provider: Florina Ou, MD ? ?Date of Admission: 04/26/2022  ?Date of Consult: 04/27/22  ? ?Reason for Consultation: Rectal bleeding  ? ?Patient's Chief Complaint:   ?Chief Complaint  ?Patient presents with  ? GI Bleeding  ? ? ?Patient 55 year old African-American male with history of chronic respiratory failure on 3 L nasal cannula, combined systolic and diastolic heart failure with reduced ejection fraction, DM2, ESRD on HD, CAD on aspirin and Plavix who presented to the hospital rectal bleeding.  Gastroenterology is consulted for said rectal bleeding and rectal pain. ? ?Due to his pain and bleeding he did not attend dialysis session on Monday Wednesday and Friday.  He presented more short of breath and volume overloaded with electrolyte abnormalities addressed by the hospitalist. ? ?Patient noticed earlier this week he has been dealing with constipation, straining, and long periods of sitting on the toilet.  He then began noticing rectal pain with defecation as well as blood on the tissue paper and in the toilet bowl.  This is continued throughout the week which prevented him from going to dialysis due to the rectal pain.  He notes that he has discomfort when he sits directly on his bottom causing him to need to lay to the side.  Denies melena, no nausea vomiting appetite changes or weight loss.  He has been tolerating p.o. without issue.  He showed pictures of blood in toilet and tissue paper on phone. Plavix has been held since his arrival. No other acute GI complaints. Reports ER provider performed rectal exam and he was tender. CT performed and unremarkable. Hgb at baseline. ? ?He underwent colonoscopy in March as below. ? ?On aspirin '81mg'$  and plavix ?Denies NSAIDs and anticoagulants ?Denies family history of gastrointestinal disease and malignancy ?Previous Endoscopies: ?02/2022 Colonoscopy- [facility]- 1 inflammatory and 1  TA polyp; otherwise unremarkable ?No h/o egd ? ? ?Past Medical History:  ?Diagnosis Date  ? Anemia of chronic disease   ? Bell's palsy   ? Cataract   ? Chronic combined systolic (congestive) and diastolic (congestive) heart failure (HCC)   ? Chronic respiratory failure with hypoxia (HCC)   ? 3 lpm continuous  ? COPD (chronic obstructive pulmonary disease) ([place])   ? Coronary artery disease   ? Diabetes mellitus without complication ([place])   ? Dyspnea   ? Wheezing  ? ESRD on hemodialysis ([place])   ? GERD (gastroesophageal reflux disease)   ? Gout   ? HOH (hard of hearing)   ? mild  ? Hypercholesterolemia   ? Hypertension   ? NSVT (nonsustained ventricular tachycardia) ([place])   ? Obstructive sleep apnea   ? CPAP, O2 use continuosly at 3LPM  ? Orthopnea   ? Pneumonia   ? In Past X2 most recent 1 yr ago  ? Psoriasis   ? Pulmonary HTN ([place])   ? Ventricular fibrillation ([place])   ? ? ?Past Surgical History:  ?Procedure Laterality Date  ? A/V FISTULAGRAM Right 05/02/2021  ? Procedure: A/V FISTULAGRAM;  Surgeon: Katha Cabal, MD;  Location: [place] [facility];  Service: Cardiovascular;  Laterality: Right;  ? AV FISTULA PLACEMENT Right 11/20/2019  ? Procedure: ARTERIOVENOUS (AV) FISTULA CREATION ( BRACHIAL CEPHALIC );  Surgeon: Katha Cabal, MD;  Location: [facility] ORS;  Service: Vascular;  Laterality: Right;  ? CATARACT EXTRACTION W/PHACO Left 12/17/2019  ? Procedure: CATARACT EXTRACTION PHACO AND INTRAOCULAR LENS PLACEMENT (IOC) LEFT  ISTENT INJ DIABETIC;  Surgeon: Eulogio Bear, MD;  Location: [facility];  Service: Ophthalmology;  Laterality: Left;  [place] 00:33.2 ?CDE 2.96 ?Fluid Pack Lot # [id number]  ? CATARACT EXTRACTION W/PHACO Right 02/24/2020  ? Procedure: CATARACT EXTRACTION PHACO AND INTRAOCULAR LENS PLACEMENT (IOC) RIGHT DIABETIC ISTENT INJ;  Surgeon: Eulogio Bear, MD;  Location: [facility];  Service: Ophthalmology;  Laterality: Right;  [place] 00:37.3 ?CDE 2.88 ?Fluid Pack Lot # [id number]  ? COLONOSCOPY WITH  PROPOFOL N/A 03/06/2022  ? Procedure: COLONOSCOPY WITH PROPOFOL;  Surgeon: Jonathon Bellows, MD;  [facility];  Service: Gastroenterology;  Laterality: N/A;  ARRIVAL IS 6:45 AM - TRANSPORTATION IS ACTA. PATIENT CALLED 02/28/2022.  ? CORONARY ANGIOPLASTY WITH STENT PLACEMENT    ? stent placement  ? CORONARY STENT INTERVENTION N/A 08/28/2021  ? Procedure: CORONARY STENT INTERVENTION;  Surgeon: Nelva Bush, MD;  Location: [place] [facility];  Service: Cardiovascular;  Laterality: N/A;  ? DIALYSIS/PERMA CATHETER INSERTION N/A 06/22/2019  ? Procedure: DIALYSIS/PERMA CATHETER INSERTION;  Surgeon: Algernon Huxley, MD;  Location: [place] [facility];  Service: Cardiovascular;  Laterality: N/A;  ? DIALYSIS/PERMA CATHETER REMOVAL N/A 05/31/2020  ? Procedure: DIALYSIS/PERMA CATHETER REMOVAL;  Surgeon: Katha Cabal, MD;  Location: [place] [facility];  Service: Cardiovascular;  Laterality: N/A;  ? EYE SURGERY    ? INSERTION OF AHMED VALVE Left 12/17/2019  ? Procedure: INSERTION OF iSTENT;  Surgeon: Eulogio Bear, MD;  Location: [facility];  Service: Ophthalmology;  Laterality: Left;  ? INTRAVASCULAR IMAGING/OCT N/A 08/28/2021  ? Procedure: INTRAVASCULAR IMAGING/OCT;  Surgeon: Nelva Bush, MD;  Location: [place] [facility];  Service: Cardiovascular;  Laterality: N/A;  ? LEFT HEART CATH AND CORONARY ANGIOGRAPHY N/A 08/08/2021  ? Procedure: LEFT HEART CATH AND CORONARY ANGIOGRAPHY;  Surgeon: Nelva Bush, MD;  Location: [place] [facility];  Service: Cardiovascular;  Laterality: N/A;  ? ? ?Allergies  ?Allergen Reactions  ? Shellfish Allergy Anaphylaxis  ?  Face and throat swelling, difficulty breathing ?Allergy can be triggered by touching (contact)  ? Betadine [Povidone Iodine] Rash  ? Povidone-Iodine Rash  ? ? ?Family History  ?Problem Relation Age of Onset  ? Heart failure Mother   ? Kidney failure Brother   ? ? ?Social History  ? ?Tobacco Use  ? Smoking status: Never  ? Smokeless tobacco: Never   ?Vaping Use  ? Vaping Use: Never used  ?Substance Use Topics  ? Alcohol use: Not Currently  ?  Alcohol/week: 1.0 standard drink  ?  Types: 1 Cans of beer per week  ?  Comment: rare occassion  ? Drug use: Not Currently  ?  Types: Marijuana  ?  Comment: in high school   ?  ? ?Pertinent GI related history and allergies were reviewed with the patient ? ?Review of Systems  ?Constitutional:  Negative for activity change, appetite change, chills, diaphoresis, fatigue, fever and unexpected weight change.  ?HENT:  Negative for trouble swallowing and voice change.   ?Respiratory:  Positive for shortness of breath. Negative for wheezing.   ?Cardiovascular:  Negative for chest pain, palpitations and leg swelling.  ?Gastrointestinal:  Positive for anal bleeding and rectal pain. Negative for abdominal distention, abdominal pain, blood in stool, constipation, diarrhea, nausea and vomiting.  ?Musculoskeletal:  Negative for arthralgias and myalgias.  ?Skin:  Negative for color change and pallor.  ?Neurological:  Negative for dizziness, syncope and weakness.  ?Psychiatric/Behavioral:  Negative for confusion. The patient is not nervous/anxious.   ?All other  systems reviewed and are negative.  ? ?Medications ?Home Medications ?No current facility-administered medications on file prior to encounter.  ? ?Current Outpatient Medications on File Prior to Encounter  ?Medication Sig Dispense Refill  ? Accu-Chek Softclix Lancets lancets Use to check blood sugars 2-3 times daily with goals = <130 fasting in morning and <180 two hours after eating.  Bring blood sugar log to appointments. 100 each 12  ? albuterol (PROVENTIL HFA;VENTOLIN HFA) 108 (90 Base) MCG/ACT inhaler Inhale 2 puffs into the lungs every 4 (four) hours as needed for wheezing or shortness of breath. 6.7 g 5  ? allopurinol (ZYLOPRIM) 100 MG tablet Take 100 mg by mouth daily.    ? aspirin EC 81 MG tablet Take 81 mg by mouth in the morning. Swallow whole.    ? atorvastatin  (LIPITOR) 40 MG tablet Take 1 tablet (40 mg total) by mouth at bedtime. 90 tablet 1  ? benzonatate (TESSALON) 200 MG capsule Take 1 capsule (200 mg total) by mouth 3 (three) times daily as needed for cough. 30 caps

## 2022-04-27 NOTE — Progress Notes (Addendum)
[name] ? ?MRN: [id number] ? ?DOB/AGE: 1966/02/02 56 y.o. ? ?Primary Care Physician:Cannady, Barbaraann Faster, NP ? ?Admit date: 04/26/2022 ? ?Chief Complaint:  ?Chief Complaint  ?Patient presents with  ? GI Bleeding  ? ? ?S-Pt presented on  04/26/2022 with  ?Chief Complaint  ?Patient presents with  ? GI Bleeding  ?. ? Patient is well-known to our practice.  Patient receives outpatient dialysis treatment at [facility] on Monday Wednesday Friday schedule supervised by Dr. Holley Raring. ? ?Patient is a 56 year old African-American male with a past medical history of ESRD, on hemodialysis, on Monday Wednesday Friday schedule, chronic combined systolic and diastolic CHF, chronic respiratory failure on 2 L of oxygen, COPD, cardiac disease, diabetes mellitus type 2 who came to the ER with chief complaint of GI bleeding. ? ?History of present illness dates back to this Monday when patient started having bright red blood per rectum.  Patient complains of pain in his rectal area.  Patient complains of shortness of breath which was getting progressively worse.  Patient states his bleeding per rectum was worsening and just recently came to the ER today ?Patient also gives a history that he missed his Monday and Wednesday dialysis treatment because of his bleeding issues. ?Upon evaluation in the ER patient was found to have hyperkalemia.  Patient was admitted. ?Nephrology was consulted for comanagement of dialysis patient. ? ? ?Patient was seen today in the Unit ? ? ? ? ? ?Medications ? allopurinol  100 mg Oral Daily  ? aspirin EC  81 mg Oral q AM  ? atorvastatin  40 mg Oral QHS  ? calcium acetate  667 mg Oral TID WC  ? Chlorhexidine Gluconate Cloth  6 each Topical Q0600  ? clopidogrel  75 mg Oral Q breakfast  ? heparin  5,000 Units Subcutaneous Q8H  ? insulin aspart  0-15 Units Subcutaneous TID WC  ? isosorbide mononitrate  15 mg Oral Daily  ? losartan  12.5 mg Oral Daily  ? metoprolol succinate  50 mg Oral Daily  ?  pantoprazole  40 mg Oral Daily  ? patiromer  16.8 g Oral Daily  ? sodium chloride flush  3 mL Intravenous Q12H  ? tamsulosin  0.4 mg Oral QPC breakfast  ? umeclidinium-vilanterol  1 puff Inhalation Daily  ? ? ? ? ? ? ? ?DGU:YQIHK from the symptoms mentioned above,there are no other symptoms referable to all systems reviewed. ? ?Physical Exam: ?Vital signs in last 24 hours: ?Temp:  [97.8 ?F (36.6 ?C)-99.5 ?F (37.5 ?C)] 97.8 ?F (36.6 ?C) (05/12 0200) ?Pulse Rate:  [74-92] 74 (05/12 0630) ?Resp:  [10-25] 17 (05/12 0530) ?BP: (111-160)/(60-118) 114/64 (05/12 0630) ?SpO2:  [91 %-100 %] 95 % (05/12 0630) ?Weight:  [116.1 kg-126.1 kg] 124.4 kg (05/12 0145) ?Weight change:  ?Last BM Date : 04/26/22 ? ?Intake/Output from previous day: ?05/11 0701 - 05/12 0700 ?In: -  ?Out: 2000  ?No intake/output data recorded. ? ? ?Physical Exam: ? ?General- pt is awake,alert, oriented to time place and person ? ?HEENT head is atraumatic normocephalic nasal cannula in situ ? ?Resp-  decreased breath sound at bases, patient has no respiratory distress ? ?CVS- S1S2 regular in rate and rhythm ? ?GIT- BS+, soft, Non tender , Non distended, Obese ? ?EXT-1+ LE Edema,  No Cyanosis ? ?Access- LUE AVF ? ?Lab Results: ? ?CBC ? ?Recent Labs  ?  04/26/22 ?1622 04/27/22 ?0449  ?WBC 9.2 8.7  ?HGB 9.0* 8.6*  ?HCT 28.8* 27.6*  ?PLT 176 165  ? ? ?  BMET ? ?Recent Labs  ?  04/26/22 ?1622 04/26/22 ?2328 04/27/22 ?0449  ?NA 137  --  138  ?K 6.0* 4.5 4.2  ?CL 102  --  100  ?CO2 25  --  29  ?GLUCOSE 174*  --  187*  ?BUN 111*  --  66*  ?CREATININE 15.40*  --  10.22*  ?CALCIUM 8.2*  --  8.1*  ? ? ? ? ?Most recent Creatinine trend  ?Lab Results  ?Component Value Date  ? CREATININE 10.22 (H) 04/27/2022  ? CREATININE 15.40 (H) 04/26/2022  ? CREATININE 11.31 (H) 01/14/2022  ?  ? ? ?MICRO ? ? ?No results found for this or any previous visit (from the past 240 hour(s)).  ? ? ? ? ? ?Impression: ? ? ?1)Renal   ? ?End-stage renal disease ?Patient is on hemodialysis ?Patient  is a Monday Wednesday Friday schedule ?Patient missed his Monday and Wednesday session ?Patient  needed emergent dialysis On Thursday night because of hyperkalemia ? ?We will dialyze patient today ? ?2)HTN ? ? patient blood pressure is controlled ?Dialysis/fluid should help ? ? ?3)Anemia of chronic disease and blood loss ? ? ?  Latest Ref Rng & Units 04/27/2022  ?  4:49 AM 04/26/2022  ?  4:22 PM 01/30/2022  ?  9:58 AM  ?CBC  ?WBC 4.0 - 10.5 K/uL 8.7   9.2     ?Hemoglobin 13.0 - 17.0 g/dL 8.6   9.0   10.6    ?Hematocrit 39.0 - 52.0 % 27.6   28.8   34.5    ?Platelets 150 - 400 K/uL 165   176     ?  ? ? ? HGb is just at goal (9--11) ?We will keep patient on Epogen ? ?4) Secondary hyperparathyroidism -CKD Mineral-Bone Disorder ? ? ? ?Lab Results  ?Component Value Date  ? PTH 369 (H) 06/16/2019  ? CALCIUM 8.1 (L) 04/27/2022  ? CAION 1.11 (L) 02/24/2020  ? PHOS 3.6 04/27/2022  ? ? ?Secondary Hyperparathyroidism present ?Patient phosphorus on the high side ?We will follow ? ? ?5)Rectal bleeding/rectal pain ?Secondary to rectal fissures ?Patient is being followed by the GI the primary team ? ? ?6) Electrolytes  ? ? ?  Latest Ref Rng & Units 04/27/2022  ?  4:49 AM 04/26/2022  ? 11:28 PM 04/26/2022  ?  4:22 PM  ?BMP  ?Glucose 70 - 99 mg/dL 187    174    ?BUN 6 - 20 mg/dL 66    111    ?Creatinine 0.61 - 1.24 mg/dL 10.22    15.40    ?Sodium 135 - 145 mmol/L 138    137    ?Potassium 3.5 - 5.1 mmol/L 4.2   4.5   6.0    ?Chloride 98 - 111 mmol/L 100    102    ?CO2 22 - 32 mmol/L 29    25    ?Calcium 8.9 - 10.3 mg/dL 8.1    8.2    ?  ? ?Sodium ?Normonatremic ? ? ?Potassium ? hyperkalemia ?Secondary to nonadherence to renal replacement therapy ?We will dialyze patient today ? ? ? ?7)Acid base ? ? ? ?Co2 at goal ? ? ? ? ?Plan: ? ?We will dialyze patient today ? ?Addendum ?Patient was seen on dialysis ?Patient tolerating treatment well ? ? ? ?Hulen Mandler s Theador Hawthorne ?04/27/2022, 7:35 AM  ?

## 2022-04-28 ENCOUNTER — Inpatient Hospital Stay: Payer: Medicare Other

## 2022-04-28 DIAGNOSIS — K602 Anal fissure, unspecified: Secondary | ICD-10-CM

## 2022-04-28 LAB — CBC
HCT: 31.1 % — ABNORMAL LOW (ref 39.0–52.0)
Hemoglobin: 9.2 g/dL — ABNORMAL LOW (ref 13.0–17.0)
MCH: 27.6 pg (ref 26.0–34.0)
MCHC: 29.6 g/dL — ABNORMAL LOW (ref 30.0–36.0)
MCV: 93.4 fL (ref 80.0–100.0)
Platelets: 166 10*3/uL (ref 150–400)
RBC: 3.33 MIL/uL — ABNORMAL LOW (ref 4.22–5.81)
RDW: 17.6 % — ABNORMAL HIGH (ref 11.5–15.5)
WBC: 9 10*3/uL (ref 4.0–10.5)
nRBC: 0 % (ref 0.0–0.2)

## 2022-04-28 LAB — BASIC METABOLIC PANEL
Anion gap: 10 (ref 5–15)
BUN: 49 mg/dL — ABNORMAL HIGH (ref 6–20)
CO2: 29 mmol/L (ref 22–32)
Calcium: 7.7 mg/dL — ABNORMAL LOW (ref 8.9–10.3)
Chloride: 95 mmol/L — ABNORMAL LOW (ref 98–111)
Creatinine, Ser: 8.92 mg/dL — ABNORMAL HIGH (ref 0.61–1.24)
GFR, Estimated: 7 mL/min — ABNORMAL LOW (ref >60)
Glucose, Bld: 154 mg/dL — ABNORMAL HIGH (ref 70–99)
Potassium: 4.7 mmol/L (ref 3.5–5.1)
Sodium: 134 mmol/L — ABNORMAL LOW (ref 135–145)

## 2022-04-28 LAB — GLUCOSE, CAPILLARY
Glucose-Capillary: 177 mg/dL — ABNORMAL HIGH (ref 70–99)
Glucose-Capillary: 150 mg/dL — ABNORMAL HIGH (ref 70–99)

## 2022-04-28 MED ORDER — DOXYCYCLINE HYCLATE 100 MG PO TBEC
100.0000 mg | DELAYED_RELEASE_TABLET | Freq: Two times a day (BID) | ORAL | 0 refills | Status: AC
Start: 2022-04-28 — End: 2022-05-05

## 2022-04-28 MED ORDER — POLYETHYLENE GLYCOL 3350 17 G PO PACK
17.0000 g | PACK | Freq: Two times a day (BID) | ORAL | Status: AC
Start: 2022-04-28 — End: ?

## 2022-04-28 MED ORDER — METAMUCIL SMOOTH TEXTURE 58.6 % PO POWD: 1.0000 | Freq: Two times a day (BID) | ORAL | 0 refills | Status: AC

## 2022-04-28 MED ORDER — LIDOCAINE HCL URETHRAL/MUCOSAL 2 % EX GEL: 1.0000 "application " | Freq: Four times a day (QID) | CUTANEOUS | 0 refills | Status: AC

## 2022-04-28 MED ORDER — EPOETIN ALFA 10000 UNIT/ML IJ SOLN: 6000.0000 [IU] | INTRAMUSCULAR | Status: DC

## 2022-04-28 MED ORDER — DOCUSATE SODIUM 100 MG PO CAPS: 100.0000 mg | ORAL_CAPSULE | Freq: Two times a day (BID) | ORAL | Status: DC

## 2022-04-28 NOTE — Progress Notes (Signed)
Discharge Note ? ?Patient discharged home via private vehicle. IV to left arm removed with no redness or swelling noted at the site. Patient discharge instructions reviewed with patient. Patient verbalizes understanding. All patient belongings are with the patient.  ?

## 2022-04-28 NOTE — Progress Notes (Signed)
[name] ? ?MRN: [id number] ? ?DOB/AGE: 54/23/1967 54 y.o. ? ?Primary Care Physician:Cannady, Barbaraann Faster, NP ? ?Admit date: 04/26/2022 ? ?Chief Complaint:  ?Chief Complaint  ?Patient presents with  ? GI Bleeding  ? ? ?S-Pt presented on  04/26/2022 with  ?Chief Complaint  ?Patient presents with  ? GI Bleeding  ?. ? Patient is well-known to our practice.  Patient receives outpatient dialysis treatment at [facility] on Monday Wednesday Friday schedule supervised by Dr. Holley Raring. ? ?Patient is a 56 year old African-American male with a past medical history of ESRD, on hemodialysis, on Monday Wednesday Friday schedule, chronic combined systolic and diastolic CHF, chronic respiratory failure on 2 L of oxygen, COPD, cardiac disease, diabetes mellitus type 2 who came to the ER with chief complaint of GI bleeding. ? ?History of present illness dates back to this Monday when patient started having bright red blood per rectum.  Patient complains of pain in his rectal area.  Patient complains of shortness of breath which was getting progressively worse.  Patient states his bleeding per rectum was worsening and just recently came to the ER today ?Patient also gives a history that he missed his Monday and Wednesday dialysis treatment because of his bleeding issues. ?Upon evaluation in the ER patient was found to have hyperkalemia.  Patient was admitted. ?Nephrology was consulted for comanagement of dialysis patient. ? ? ?Patient was seen today on second floor. ?Patient offers no new specific physical complaints ?Patient main complaint in today visit was he was ready to go home ? ?Medications ? allopurinol  100 mg Oral Daily  ? aspirin EC  81 mg Oral q AM  ? atorvastatin  40 mg Oral QHS  ? calcium acetate  667 mg Oral TID WC  ? Chlorhexidine Gluconate Cloth  6 each Topical Q0600  ? docusate sodium  100 mg Oral BID  ? heparin  5,000 Units Subcutaneous Q8H  ? insulin aspart  0-15 Units Subcutaneous TID WC  ? isosorbide  mononitrate  15 mg Oral Daily  ? lidocaine  1 application. Other Q6H  ? losartan  12.5 mg Oral Daily  ? metoprolol succinate  50 mg Oral Daily  ? pantoprazole  40 mg Oral Daily  ? patiromer  16.8 g Oral Daily  ? polyethylene glycol  17 g Oral Daily  ? sodium chloride flush  3 mL Intravenous Q12H  ? tamsulosin  0.4 mg Oral QPC breakfast  ? umeclidinium-vilanterol  1 puff Inhalation Daily  ? ? ? ? ? ? ? ?PZW:CHENI from the symptoms mentioned above,there are no other symptoms referable to all systems reviewed. ? ?Physical Exam: ?Vital signs in last 24 hours: ?Temp:  [97.5 ?F (36.4 ?C)-97.6 ?F (36.4 ?C)] 97.6 ?F (36.4 ?C) (05/13 0409) ?Pulse Rate:  [71-83] 82 (05/13 0736) ?Resp:  [10-22] 18 (05/13 0736) ?BP: (93-146)/(55-86) 104/72 (05/13 0736) ?SpO2:  [88 %-100 %] 96 % (05/13 0736) ?Weight:  [123.9 kg-126.6 kg] 123.9 kg (05/13 0446) ?Weight change: 10.5 kg ?Last BM Date : 04/27/22 ? ?Intake/Output from previous day: ?05/12 0701 - 05/13 0700 ?In: -  ?Out: 2004  ?No intake/output data recorded. ? ? ?Physical Exam: ? ?General- pt is awake,alert, oriented to time place and person ? ?HEENT head is atraumatic normocephalic nasal cannula in situ ? ?Resp- Chest is clear to auscultation, No respiratory distress ? ?CVS- S1S2 regular in rate and rhythm ? ?GIT- BS+, soft, Non tender , Non distended, Obese ? ?EXT-1+ LE Edema,  No Cyanosis ? ?Access- LUE AVF ? ?  Lab Results: ? ?CBC ? ?Recent Labs  ?  04/26/22 ?1622 04/27/22 ?0449  ?WBC 9.2 8.7  ?HGB 9.0* 8.6*  ?HCT 28.8* 27.6*  ?PLT 176 165  ? ? ?BMET ? ?Recent Labs  ?  04/26/22 ?1622 04/26/22 ?2328 04/27/22 ?0449  ?NA 137  --  138  ?K 6.0* 4.5 4.2  ?CL 102  --  100  ?CO2 25  --  29  ?GLUCOSE 174*  --  187*  ?BUN 111*  --  66*  ?CREATININE 15.40*  --  10.22*  ?CALCIUM 8.2*  --  8.1*  ? ? ? ? ?Most recent Creatinine trend  ?Lab Results  ?Component Value Date  ? CREATININE 10.22 (H) 04/27/2022  ? CREATININE 15.40 (H) 04/26/2022  ? CREATININE 11.31 (H) 01/14/2022  ?  ? ? ?MICRO ? ? ?No  results found for this or any previous visit (from the past 240 hour(s)).  ? ? ? ? ? ?Impression: ? ? ?1)Renal   ? ?End-stage renal disease ?Patient is on hemodialysis ?Patient is a Monday Wednesday Friday schedule ?Patient missed his Monday and Wednesday session ?Patient was dialyzed emergently on Thursday because of hyperkalemia ?Patient was then dialyzed on Friday for his regular schedule. ? ?No need for dialysis today ? ?2)HTN ? ? patient blood pressure is controlled ? ? ? ?3)Anemia of chronic disease and blood loss ? ? ?  Latest Ref Rng & Units 04/27/2022  ?  4:49 AM 04/26/2022  ?  4:22 PM 01/30/2022  ?  9:58 AM  ?CBC  ?WBC 4.0 - 10.5 K/uL 8.7   9.2     ?Hemoglobin 13.0 - 17.0 g/dL 8.6   9.0   10.6    ?Hematocrit 39.0 - 52.0 % 27.6   28.8   34.5    ?Platelets 150 - 400 K/uL 165   176     ?  ? ? ?Patient hemoglobin is trending down secondary to his bleeding issues ?On  Epogen ? ? ?4) Secondary hyperparathyroidism -CKD Mineral-Bone Disorder ? ? ? ?Lab Results  ?Component Value Date  ? PTH 369 (H) 06/16/2019  ? CALCIUM 8.1 (L) 04/27/2022  ? CAION 1.11 (L) 02/24/2020  ? PHOS 3.6 04/27/2022  ? ? ?Secondary Hyperparathyroidism present ?Patient phosphorus on the high side ?We will follow ? ? ?5)Rectal bleeding/rectal pain ?Secondary to rectal fissures ?Patient is being followed by the GI the primary team ? ? ?6) Electrolytes  ? ? ?  Latest Ref Rng & Units 04/27/2022  ?  4:49 AM 04/26/2022  ? 11:28 PM 04/26/2022  ?  4:22 PM  ?BMP  ?Glucose 70 - 99 mg/dL 187    174    ?BUN 6 - 20 mg/dL 66    111    ?Creatinine 0.61 - 1.24 mg/dL 10.22    15.40    ?Sodium 135 - 145 mmol/L 138    137    ?Potassium 3.5 - 5.1 mmol/L 4.2   4.5   6.0    ?Chloride 98 - 111 mmol/L 100    102    ?CO2 22 - 32 mmol/L 29    25    ?Calcium 8.9 - 10.3 mg/dL 8.1    8.2    ?  ? ?Sodium ?Normonatremic ? ? ?Potassium ? hyperkalemia ?Secondary to nonadherence to renal replacement therapy ?Patient potassium is now better ? ? ? ?7)Acid base ? ? ? ?Co2 at  goal ? ? ? ? ?Plan: ? ?No need for renal replacement therapy today ?We  will increase the dose of Epogen as patient hemoglobin is trending down ? ? ?Capri Veals s Theador Hawthorne ?04/28/2022, 8:30 AM  ?

## 2022-04-28 NOTE — Discharge Summary (Addendum)
?Physician Discharge Summary ?  ?Patient: [name] MRN: [id number] DOB: Jun 26, 1966  ?Admit date:     04/26/2022  ?Discharge date: 04/28/22  ?Discharge Physician: Jennye Boroughs  ? ?PCP: Venita Lick, NP  ? ?Recommendations at discharge:  ? ? ?Follow up with PCP in 1 week. ? ?Follow-up with Dr. Virgina Jock, gastroenterologist, in 1 week ? ?Discharge Diagnoses: ?Principal Problem: ?  Rectal bleeding ?Active Problems: ?  Abdominal pain ?  Chronic combined systolic (congestive) and diastolic (congestive) heart failure (HCC) ?  CAD (coronary artery disease) ?  ESRD (end stage renal disease) on dialysis [facility]) ?  Type 2 diabetes mellitus with hyperglycemia, with long-term current use of insulin ([place]) ?  Obstructive sleep apnea ?  COPD (chronic obstructive pulmonary disease) ([place]) ?  Rectal fissure ? ?Resolved Problems: ?  DM type 2 with diabetic peripheral neuropathy ([place]) ? ?Hospital Course: ? ?[name] is a 56 y.o. male with medical history significant for type II DM, ESRD on hemodialysis, CAD s/p stent to proximal LAD on 08/28/2021, chronic combined systolic and diastolic CHF, chronic hypoxic respiratory failure on 3 L/min oxygen, COPD, OSA, who presented to the hospital because of rectal pain and bleeding of about 4 days duration.  He noticed bright red blood from the rectum about 4 days prior to admission and this has been associated with severe rectal pain.  He missed hemodialysis on Monday and Wednesday because of rectal pain and bleeding.  Symptoms progressed so he presented to the ED.  He was on aspirin and Plavix until about 3 weeks prior to admission when he was advised by his pulmonologist to come off Plavix.  No history of prior GI bleed. ? ?He was evaluated by the gastroenterologist.  His symptoms were attributed to probable rectal fissure.  He was treated with analgesics.  He had mild acute blood loss anemia which did not require blood transfusion.  He was seen by the nephrologist and he  underwent hemodialysis for fluid overload.  On the day of discharge, he complained of scrotal pain and swelling.  Ultrasound of the scrotum revealed diffuse skin thickening in the scrotal wall suggesting contusion or cellulitis.  Because he is immunocompromised, he was given doxycycline for possible scrotal wall cellulitis. ?  ? ? ? ?  ? ?Consultants: Gastroenterologist ?Procedures performed: None ?Disposition: Home ?Diet recommendation:  ?Discharge Diet Orders (From admission, onward)  ? ?  Start     Ordered  ? 04/28/22 0000  Diet - low sodium heart healthy       ? 04/28/22 0945  ? 04/28/22 0000  Diet Carb Modified       ? 04/28/22 0945  ? ?  ?  ? ?  ? ?Cardiac and Carb modified diet ?DISCHARGE MEDICATION: ?Allergies as of 04/28/2022   ? ?   Reactions  ? Shellfish Allergy Anaphylaxis  ? Face and throat swelling, difficulty breathing ?Allergy can be triggered by touching (contact)  ? Betadine [povidone Iodine] Rash  ? Povidone-iodine Rash  ? ?  ? ?  ?Medication List  ?  ? ?STOP taking these medications   ? ?clopidogrel 75 MG tablet ?Commonly known as: PLAVIX ?  ? ?  ? ?TAKE these medications   ? ?Accu-Chek Guide Me w/Device Kit ?Use to check blood sugars 2-3 times daily with goals = <130 fasting in morning and <180 two hours after eating.  Bring blood sugar log to appointments. ?  ?Accu-Chek Softclix Lancets lancets ?Use to check blood sugars 2-3  times daily with goals = <130 fasting in morning and <180 two hours after eating.  Bring blood sugar log to appointments. ?  ?albuterol 108 (90 Base) MCG/ACT inhaler ?Commonly known as: VENTOLIN HFA ?Inhale 2 puffs into the lungs every 4 (four) hours as needed for wheezing or shortness of breath. ?  ?allopurinol 100 MG tablet ?Commonly known as: ZYLOPRIM ?Take 100 mg by mouth daily. ?  ?Anoro Ellipta 62.5-25 MCG/ACT Aepb ?Generic drug: umeclidinium-vilanterol ?Inhale 1 puff into the lungs daily. ?  ?aspirin EC 81 MG tablet ?Take 81 mg by mouth in the morning. Swallow  whole. ?  ?atorvastatin 40 MG tablet ?Commonly known as: LIPITOR ?Take 1 tablet (40 mg total) by mouth at bedtime. ?  ?Belsomra 10 MG Tabs ?Generic drug: Suvorexant ?Take 10 mg by mouth at bedtime as needed. ?  ?benzonatate 200 MG capsule ?Commonly known as: TESSALON ?Take 1 capsule (200 mg total) by mouth 3 (three) times daily as needed for cough. ?  ?calcium acetate 667 MG capsule ?Commonly known as: PHOSLO ?Take 667 mg by mouth 3 (three) times daily with meals. ?  ?Cholecalciferol 25 MCG (1000 UT) tablet ?Commonly known as: D3-1000 ?Take 2 tablets (2,000 Units total) by mouth daily. ?  ?cyclobenzaprine 5 MG tablet ?Commonly known as: FLEXERIL ?Take 5 mg by mouth at bedtime as needed. ?  ?dapagliflozin propanediol 10 MG Tabs tablet ?Commonly known as: [place] ?Take 1 tablet (10 mg total) by mouth daily before breakfast. ?  ?docusate sodium 100 MG capsule ?Commonly known as: Colace ?Take 1 capsule (100 mg total) by mouth 2 (two) times daily. ?What changed: when to take this ?  ?doxycycline 100 MG EC tablet ?Commonly known as: DORYX ?Take 1 tablet (100 mg total) by mouth 2 (two) times daily for 7 days. ?  ?gabapentin 300 MG capsule ?Commonly known as: NEURONTIN ?Take 300 mg by mouth at bedtime. ?  ?glimepiride 4 MG tablet ?Commonly known as: AMARYL ?Take 4 mg by mouth daily with breakfast. ?  ?glucose blood test strip ?Use to check blood sugars 2-3 times daily with goals = <130 fasting in morning and <180 two hours after eating.  Bring blood sugar log to appointments. ?  ?isosorbide mononitrate 30 MG 24 hr tablet ?Commonly known as: IMDUR ?Take 0.5 tablets (15 mg total) by mouth daily. ?  ?lidocaine 2 % jelly ?Commonly known as: XYLOCAINE ?1 application. by Other route every 6 (six) hours. ?  ?losartan 25 MG tablet ?Commonly known as: COZAAR ?Take 0.5 tablets (12.5 mg total) by mouth daily. ?  ?Metamucil Smooth Texture 58.6 % powder ?Generic drug: psyllium ?Take 1 packet by mouth 2 (two) times daily for 7 days. ?   ?metoprolol succinate 50 MG 24 hr tablet ?Commonly known as: TOPROL-XL ?Take 1 tablet (50 mg total) by mouth daily. ?  ?ondansetron 4 MG tablet ?Commonly known as: Zofran ?Take 1 tablet (4 mg total) by mouth every 8 (eight) hours as needed for nausea or vomiting. ?  ?OXYGEN ?Inhale 3 L into the lungs daily. ?  ?pantoprazole 40 MG tablet ?Commonly known as: PROTONIX ?Take 1 tablet (40 mg total) by mouth daily. ?  ?polyethylene glycol 17 g packet ?Commonly known as: MIRALAX / GLYCOLAX ?Take 17 g by mouth 2 (two) times daily. ?  ?silodosin 4 MG Caps capsule ?Commonly known as: RAPAFLO ?TAKE 1 CAPSULE ONCE DAILY WITH BREAKFAST ?  ?Skyrizi 150 MG/ML Sosy ?Generic drug: Risankizumab-rzaa ?Inject 150 mg into the skin as directed. Every 12 weeks  for maintenance. ?  ?Tab-A-Vite Tabs ?TAKE 1 TABLET BY MOUTH ONCE DAILY. ?  ?tiZANidine 2 MG tablet ?Commonly known as: ZANAFLEX ?Take 1 tablet (2 mg total) by mouth daily as needed for muscle spasms. ?  ?Torsemide 40 MG Tabs ?Take TWO tablets (80 mg) TWICE daily only on days you DO NOT have dialysis: Tuesday, Thursday, Saturday and Sunday ?  ?Tyler Aas FlexTouch 100 UNIT/ML FlexTouch Pen ?Generic drug: insulin degludec ?Inject 20 Units into the skin at bedtime. ?  ?triamcinolone 0.025 % cream ?Commonly known as: KENALOG ?Apply 1 application topically daily in the afternoon. ?  ?Trulicity 3 UP/7.3HD Sopn ?Generic drug: Dulaglutide ?Inject 3 mg into the skin every Thursday. ?  ?Zoryve 0.3 % Crea ?Generic drug: Roflumilast ?APPLY AS DIRECTED TO AFFECTED AREAS OF PSORIASIS ONCE DAILY UNTIL CLEAR, THEN USE AS NEEDED FOR FLARES ?  ? ?  ? ? Follow-up Information   ? ? Annamaria Helling, DO Follow up in 1 week(s).   ?Specialty: Gastroenterology ?Contact information: ?[facility] [address] ?[phone] ? ? ?  ?  ? ?  ?  ? ?  ? ?Discharge Exam: ?Filed Weights  ? 04/27/22 1357 04/27/22 1745 04/28/22 0446  ?Weight: 126.6 kg 124.3 kg 123.9 kg  ? ?GEN:  NAD ?SKIN: Warm and dry ?EYES: No pallor or icterus ?ENT: MMM ?CV: RRR ?PULM: CTA B ?ABD: soft, obese, NT, +BS ?CNS: AAO x 3, non focal ?EXT: No edema or tenderness ? ? ?Condition at discharge: good ? ?The results o

## 2022-04-28 NOTE — Progress Notes (Signed)
Update on Patient Discharge ? ?Patient has been waiting on his ride for the past little bit, when he mentioned that his testicles are "very swollen" and that it "hurt to walk".  Dr. Mal Misty notified, and he ordered an ultrasound of the scotum. Patient is awaiting results of test to see if its a concerning issue, or if the patient can go home with nephew.  ?

## 2022-05-01 ENCOUNTER — Telehealth: Payer: Self-pay | Admitting: *Deleted

## 2022-05-01 NOTE — Telephone Encounter (Signed)
Transition Care Management Unsuccessful Follow-up Telephone Call ? ?Date of discharge and from where:  [place] 04-28-2022 ? ?Attempts:  1st Attempt ? ?Reason for unsuccessful TCM follow-up call:  Left voice message ? ?  ?

## 2022-05-02 NOTE — Telephone Encounter (Signed)
Transition Care Management Unsuccessful Follow-up Telephone Call ? ?Date of discharge and from where:  [place] [phone] ? ?Attempts:  2nd Attempt ? ?Reason for unsuccessful TCM follow-up call:  Left voice message ? ?  ?

## 2022-05-03 ENCOUNTER — Ambulatory Visit: Payer: Medicare Other | Admitting: Adult Health

## 2022-05-10 LAB — HM DIABETES EYE EXAM

## 2022-05-13 NOTE — Patient Instructions (Incomplete)

## 2022-05-15 ENCOUNTER — Ambulatory Visit: Payer: Medicare Other | Admitting: Nurse Practitioner

## 2022-05-15 DIAGNOSIS — I5042 Chronic combined systolic (congestive) and diastolic (congestive) heart failure: Secondary | ICD-10-CM

## 2022-05-15 DIAGNOSIS — E1169 Type 2 diabetes mellitus with other specified complication: Secondary | ICD-10-CM

## 2022-05-15 DIAGNOSIS — J449 Chronic obstructive pulmonary disease, unspecified: Secondary | ICD-10-CM

## 2022-05-15 DIAGNOSIS — Z794 Long term (current) use of insulin: Secondary | ICD-10-CM

## 2022-05-15 DIAGNOSIS — N186 End stage renal disease: Secondary | ICD-10-CM

## 2022-05-15 DIAGNOSIS — Z1159 Encounter for screening for other viral diseases: Secondary | ICD-10-CM

## 2022-05-15 DIAGNOSIS — N400 Enlarged prostate without lower urinary tract symptoms: Secondary | ICD-10-CM

## 2022-05-15 DIAGNOSIS — J9611 Chronic respiratory failure with hypoxia: Secondary | ICD-10-CM

## 2022-05-15 DIAGNOSIS — E662 Morbid (severe) obesity with alveolar hypoventilation: Secondary | ICD-10-CM

## 2022-05-15 DIAGNOSIS — E113299 Type 2 diabetes mellitus with mild nonproliferative diabetic retinopathy without macular edema, unspecified eye: Secondary | ICD-10-CM

## 2022-05-15 DIAGNOSIS — I429 Cardiomyopathy, unspecified: Secondary | ICD-10-CM

## 2022-05-16 ENCOUNTER — Ambulatory Visit: Payer: Medicare Other | Admitting: Internal Medicine

## 2022-05-26 NOTE — Patient Instructions (Incomplete)

## 2022-05-29 ENCOUNTER — Ambulatory Visit: Payer: Medicare Other | Admitting: Nurse Practitioner

## 2022-05-29 DIAGNOSIS — E538 Deficiency of other specified B group vitamins: Secondary | ICD-10-CM

## 2022-05-29 DIAGNOSIS — Z1159 Encounter for screening for other viral diseases: Secondary | ICD-10-CM

## 2022-05-29 DIAGNOSIS — E1165 Type 2 diabetes mellitus with hyperglycemia: Secondary | ICD-10-CM

## 2022-05-29 DIAGNOSIS — J449 Chronic obstructive pulmonary disease, unspecified: Secondary | ICD-10-CM

## 2022-05-29 DIAGNOSIS — N400 Enlarged prostate without lower urinary tract symptoms: Secondary | ICD-10-CM

## 2022-05-29 DIAGNOSIS — J9611 Chronic respiratory failure with hypoxia: Secondary | ICD-10-CM

## 2022-05-29 DIAGNOSIS — E785 Hyperlipidemia, unspecified: Secondary | ICD-10-CM

## 2022-05-29 DIAGNOSIS — I429 Cardiomyopathy, unspecified: Secondary | ICD-10-CM

## 2022-05-29 DIAGNOSIS — I5042 Chronic combined systolic (congestive) and diastolic (congestive) heart failure: Secondary | ICD-10-CM

## 2022-05-29 DIAGNOSIS — N186 End stage renal disease: Secondary | ICD-10-CM

## 2022-05-31 ENCOUNTER — Telehealth (HOSPITAL_COMMUNITY): Payer: Self-pay | Admitting: Vascular Surgery

## 2022-05-31 ENCOUNTER — Ambulatory Visit (INDEPENDENT_AMBULATORY_CARE_PROVIDER_SITE_OTHER): Payer: Medicare Other | Admitting: Cardiology

## 2022-05-31 ENCOUNTER — Encounter: Payer: Self-pay | Admitting: Cardiology

## 2022-05-31 VITALS — BP 128/90 | HR 82 | Ht 63.0 in | Wt 258.6 lb

## 2022-05-31 DIAGNOSIS — I10 Essential (primary) hypertension: Secondary | ICD-10-CM | POA: Diagnosis not present

## 2022-05-31 DIAGNOSIS — I2510 Atherosclerotic heart disease of native coronary artery without angina pectoris: Secondary | ICD-10-CM

## 2022-05-31 DIAGNOSIS — I255 Ischemic cardiomyopathy: Secondary | ICD-10-CM

## 2022-05-31 DIAGNOSIS — I4901 Ventricular fibrillation: Secondary | ICD-10-CM

## 2022-05-31 MED ORDER — METOPROLOL SUCCINATE ER 25 MG PO TB24: 25.0000 mg | ORAL_TABLET | Freq: Every day | ORAL | 1 refills | Status: DC

## 2022-05-31 MED ORDER — METOLAZONE 5 MG PO TABS: 5.0000 mg | ORAL_TABLET | Freq: Every day | ORAL | 3 refills | Status: DC

## 2022-05-31 NOTE — Patient Instructions (Addendum)
Medication Instructions:   Your physician has recommended you make the following change in your medication:    DECREASE your Metoprolol Succinate to 25 MG once a day.  2.    START taking Metolazone 5 MG once a day.  *If you need a refill on your cardiac medications before your next appointment, please call your pharmacy*   Follow-Up: At [facility], you and your health needs are our priority.  As part of our continuing mission to provide you with exceptional heart care, we have created designated Provider Care Teams.  These Care Teams include your primary Cardiologist (physician) and Advanced Practice Providers (APPs -  Physician Assistants and Nurse Practitioners) who all work together to provide you with the care you need, when you need it.  We recommend signing up for the patient portal called "MyChart".  Sign up information is provided on this After Visit Summary.  MyChart is used to connect with patients for Virtual Visits (Telemedicine).  Patients are able to view lab/test results, encounter notes, upcoming appointments, etc.  Non-urgent messages can be sent to your provider as well.   To learn more about what you can do with MyChart, go to [email or web address].    Your next appointment:   6-8 week(s)  The format for your next appointment:   In Person  Provider:    With Dr. Garen Lah ONLY  Important Information About Sugar

## 2022-05-31 NOTE — Telephone Encounter (Signed)
Lvm to make new chf appt [facility], new pt ref for chf, options are: 6/27 at 3:20, 6/30 at 3:20, or 7/7 at 10:40, thanks

## 2022-05-31 NOTE — Progress Notes (Signed)
Cardiology Office Note:    Date:  05/31/2022   ID:  [name], DOB 1966/05/04, MRN [id number]  PCP:  Venita Lick, NP  Cardiologist:  Kate Sable, MD  Electrophysiologist:  Vickie Epley, MD    Chief Complaint  Patient presents with   Follow-up    6 week follow up. Patient has low energy everyday, breathing hard with simple activities, cough for a month, and right shoulderis achy. Retaining fluid feet and ankles.  Meds reviewed with patient.     History of Present Illness:    [name] is a 56 y.o. male with a hx of CAD(PCI to RCA 2019, PCI to LAD and Lcx 08/2021) VF arrest 04/2021 (deemed ischemic w/ subsequent pci), hypertension, diabetes, ESRD on dialysis 3 times a week, OSA, COPD, who presents for follow-up.    Being seen for cardiomyopathy, CAD. Still complaints of Shortness of breath, edema. States fluid is usually taken out during dialysis but he sometimes develops low BP and fatigue. Extra dialysis sessions has been suggested by dialysis team. He makes minimal urine on current dose of torsemide. Still has occasional cough.  Prior notes CMR LVEF 26%, anterior lateral wall subendocardial scar. Echo 09/2021 EF 35 to 40%. Echo 04/2021 EF 50 to 55% LHC 07/2021 80% LAD, 90% Lcx , patent mid RCA stent s/p pci in 08/2021 with DES to LAD and Lcx. echo on 09/2019 showed mildly reduced EF 45 to 50%,  Lexiscan 08/2019 did not show any evidence for ischemia.  Not on Aldactone due to previous hyperkalemia.  Past Medical History:  Diagnosis Date   Anemia of chronic disease    Bell's palsy    Cataract    Chronic combined systolic (congestive) and diastolic (congestive) heart failure (HCC)    Chronic respiratory failure with hypoxia (HCC)    3 lpm continuous   COPD (chronic obstructive pulmonary disease) (HCC)    Coronary artery disease    Diabetes mellitus without complication (HCC)    Dyspnea    Wheezing   ESRD on hemodialysis (HCC)    GERD  (gastroesophageal reflux disease)    Gout    HOH (hard of hearing)    mild   Hypercholesterolemia    Hypertension    NSVT (nonsustained ventricular tachycardia) (HCC)    Obstructive sleep apnea    CPAP, O2 use continuosly at 3LPM   Orthopnea    Pneumonia    In Past X2 most recent 1 yr ago   Psoriasis    Pulmonary HTN ([place])    Ventricular fibrillation [facility])     Past Surgical History:  Procedure Laterality Date   A/V FISTULAGRAM Right 05/02/2021   Procedure: A/V FISTULAGRAM;  Surgeon: Katha Cabal, MD;  Location: [place] [facility];  Service: Cardiovascular;  Laterality: Right;   AV FISTULA PLACEMENT Right 11/20/2019   Procedure: ARTERIOVENOUS (AV) FISTULA CREATION ( BRACHIAL CEPHALIC );  Surgeon: Katha Cabal, MD;  Location: [facility];  Service: Vascular;  Laterality: Right;   CATARACT EXTRACTION W/PHACO Left 12/17/2019   Procedure: CATARACT EXTRACTION PHACO AND INTRAOCULAR LENS PLACEMENT ([place]) LEFT ISTENT INJ DIABETIC;  Surgeon: Eulogio Bear, MD;  Location: [facility];  Service: Ophthalmology;  Laterality: Left;  [place] 00:33.2 CDE 2.96 Fluid Pack Lot # [id number]   CATARACT EXTRACTION W/PHACO Right 02/24/2020   Procedure: CATARACT EXTRACTION PHACO AND INTRAOCULAR LENS PLACEMENT ([place]) RIGHT DIABETIC ISTENT INJ;  Surgeon: Eulogio Bear, MD;  Location: [facility];  Service: Ophthalmology;  Laterality: Right;  [place] 00:37.3 CDE 2.88 Fluid Pack Lot # [id number]   COLONOSCOPY WITH PROPOFOL N/A 03/06/2022   Procedure: COLONOSCOPY WITH PROPOFOL;  Surgeon: Jonathon Bellows, MD;  Location: [facility];  Service: Gastroenterology;  Laterality: N/A;  ARRIVAL IS 6:45 AM - TRANSPORTATION IS ACTA. PATIENT CALLED 02/28/2022.   CORONARY ANGIOPLASTY WITH STENT PLACEMENT     stent placement   CORONARY STENT INTERVENTION N/A 08/28/2021   Procedure: CORONARY STENT INTERVENTION;  Surgeon: Nelva Bush, MD;  Location: [place] [facility];  Service: Cardiovascular;  Laterality: N/A;    DIALYSIS/PERMA CATHETER INSERTION N/A 06/22/2019   Procedure: DIALYSIS/PERMA CATHETER INSERTION;  Surgeon: Algernon Huxley, MD;  Location: [place] [facility];  Service: Cardiovascular;  Laterality: N/A;   DIALYSIS/PERMA CATHETER REMOVAL N/A 05/31/2020   Procedure: DIALYSIS/PERMA CATHETER REMOVAL;  Surgeon: Katha Cabal, MD;  Location: [place] [facility];  Service: Cardiovascular;  Laterality: N/A;   EYE SURGERY     INSERTION OF AHMED VALVE Left 12/17/2019   Procedure: INSERTION OF iSTENT;  Surgeon: Eulogio Bear, MD;  Location: [facility];  Service: Ophthalmology;  Laterality: Left;   INTRAVASCULAR IMAGING/OCT N/A 08/28/2021   Procedure: INTRAVASCULAR IMAGING/OCT;  Surgeon: Nelva Bush, MD;  Location: [place] [facility];  Service: Cardiovascular;  Laterality: N/A;   LEFT HEART CATH AND CORONARY ANGIOGRAPHY N/A 08/08/2021   Procedure: LEFT HEART CATH AND CORONARY ANGIOGRAPHY;  Surgeon: Nelva Bush, MD;  Location: [place] [facility];  Service: Cardiovascular;  Laterality: N/A;    Current Medications: Current Meds  Medication Sig   Accu-Chek Softclix Lancets lancets Use to check blood sugars 2-3 times daily with goals = <130 fasting in morning and <180 two hours after eating.  Bring blood sugar log to appointments.   albuterol (PROVENTIL HFA;VENTOLIN HFA) 108 (90 Base) MCG/ACT inhaler Inhale 2 puffs into the lungs every 4 (four) hours as needed for wheezing or shortness of breath.   allopurinol (ZYLOPRIM) 100 MG tablet Take 100 mg by mouth daily.   aspirin EC 81 MG tablet Take 81 mg by mouth in the morning. Swallow whole.   atorvastatin (LIPITOR) 40 MG tablet Take 1 tablet (40 mg total) by mouth at bedtime.   benzonatate (TESSALON) 200 MG capsule Take 1 capsule (200 mg total) by mouth 3 (three) times daily as needed for cough.   Blood Glucose Monitoring Suppl (ACCU-CHEK GUIDE ME) w/Device KIT Use to check blood sugars 2-3 times daily with goals = <130 fasting in morning and  <180 two hours after eating.  Bring blood sugar log to appointments.   calcium acetate (PHOSLO) 667 MG capsule Take 667 mg by mouth 3 (three) times daily with meals.   Cholecalciferol (D3-1000) 25 MCG (1000 UT) tablet Take 2 tablets (2,000 Units total) by mouth daily.   clopidogrel (PLAVIX) 75 MG tablet Take 75 mg by mouth daily.   cyclobenzaprine (FLEXERIL) 5 MG tablet Take 5 mg by mouth at bedtime as needed.   dapagliflozin propanediol (FARXIGA) 10 MG TABS tablet Take 1 tablet (10 mg total) by mouth daily before breakfast.   gabapentin (NEURONTIN) 300 MG capsule Take 300 mg by mouth at bedtime.   glucose blood test strip Use to check blood sugars 2-3 times daily with goals = <130 fasting in morning and <180 two hours after eating.  Bring blood sugar log to appointments.   insulin degludec (TRESIBA FLEXTOUCH) 100 UNIT/ML FlexTouch Pen Inject 20 Units into the skin at bedtime.   isosorbide mononitrate (IMDUR) 30 MG 24 hr tablet  Take 0.5 tablets (15 mg total) by mouth daily.   lidocaine (XYLOCAINE) 2 % jelly 1 application. by Other route every 6 (six) hours.   losartan (COZAAR) 25 MG tablet Take 0.5 tablets (12.5 mg total) by mouth daily.   metoprolol succinate (TOPROL-XL) 50 MG 24 hr tablet Take 1 tablet (50 mg total) by mouth daily.   Multiple Vitamins-Minerals (TAB-A-VITE) TABS TAKE 1 TABLET BY MOUTH ONCE DAILY.   OXYGEN Inhale 3 L into the lungs daily.   pantoprazole (PROTONIX) 40 MG tablet Take 1 tablet (40 mg total) by mouth daily.   Risankizumab-rzaa (SKYRIZI) 150 MG/ML SOSY Inject 150 mg into the skin as directed. Every 12 weeks for maintenance.   silodosin (RAPAFLO) 4 MG CAPS capsule TAKE 1 CAPSULE ONCE DAILY WITH BREAKFAST   Suvorexant (BELSOMRA) 10 MG TABS Take 10 mg by mouth at bedtime as needed.   tiZANidine (ZANAFLEX) 2 MG tablet Take 1 tablet (2 mg total) by mouth daily as needed for muscle spasms.   Torsemide 40 MG TABS Take TWO tablets (80 mg) TWICE daily only on days you DO NOT  have dialysis: Tuesday, Thursday, Saturday and Sunday   triamcinolone (KENALOG) 0.025 % cream Apply 1 application topically daily in the afternoon.   TRULICITY 3 UM/3.5TI SOPN Inject 3 mg into the skin every Thursday.   ZORYVE 0.3 % CREA APPLY AS DIRECTED TO AFFECTED AREAS OF PSORIASIS ONCE DAILY UNTIL CLEAR, THEN USE AS NEEDED FOR FLARES     Allergies:   Shellfish allergy, Betadine [povidone iodine], and Povidone-iodine   Social History   Socioeconomic History   Marital status: Single    Spouse name: Not on file   Number of children: Not on file   Years of education: Not on file   Highest education level: Not on file  Occupational History    Comment: disabled  Tobacco Use   Smoking status: Never   Smokeless tobacco: Never  Vaping Use   Vaping Use: Never used  Substance and Sexual Activity   Alcohol use: Not Currently    Alcohol/week: 1.0 standard drink of alcohol    Types: 1 Cans of beer per week    Comment: rare occassion   Drug use: Not Currently    Types: Marijuana    Comment: in high school    Sexual activity: Not on file  Other Topics Concern   Not on file  Social History Narrative   Patient uses continuous oxygen at 3L/min   Dialysis, AV fistula in L arm   Lives at home by himself ; friend gives pt. Transportation    Social Determinants of Health   Financial Resource Strain: Low Risk  (03/05/2022)   Overall Financial Resource Strain (CARDIA)    Difficulty of Paying Living Expenses: Not hard at all  Food Insecurity: No Food Insecurity (03/05/2022)   Hunger Vital Sign    Worried About Running Out of Food in the Last Year: Never true    Ran Out of Food in the Last Year: Never true  Transportation Needs: Unmet Transportation Needs (05/30/2022)   PRAPARE - Transportation    Lack of Transportation (Medical): Yes    Lack of Transportation (Non-Medical): Yes  Physical Activity: Insufficiently Active (03/05/2022)   Exercise Vital Sign    Days of Exercise per Week: 3  days    Minutes of Exercise per Session: 30 min  Stress: No Stress Concern Present (03/05/2022)   [employer] [place]    Feeling  of Stress : Not at all  Social Connections: Unknown (03/05/2022)   Social Connection and Isolation Panel [NHANES]    Frequency of Communication with Friends and Family: More than three times a week    Frequency of Social Gatherings with Friends and Family: Once a week    Attends Religious Services: Never    Marine scientist or Organizations: No    Attends Music therapist: Never    Marital Status: Not on file     Family History: The patient's family history includes Heart failure in his mother; Kidney failure in his brother.  ROS:   Please see the history of present illness.     All other systems reviewed and are negative.  EKGs/Labs/Other Studies Reviewed:    The following studies were reviewed today:   EKG:  EKG is ordered today.  EKG shows normal sinus rhythm  Recent Labs: 01/08/2022: B Natriuretic Peptide 116.9; TSH 1.712 04/27/2022: ALT 29 04/28/2022: BUN 49; Creatinine, Ser 8.92; Hemoglobin 9.2; Platelets 166; Potassium 4.7; Sodium 134  Recent Lipid Panel    Component Value Date/Time   CHOL 100 05/09/2021 0453   CHOL 116 04/21/2019 1334   TRIG 117 05/09/2021 0453   HDL 24 (L) 05/09/2021 0453   HDL 37 (L) 04/21/2019 1334   CHOLHDL 4.2 05/09/2021 0453   VLDL 23 05/09/2021 0453   LDLCALC 53 05/09/2021 0453   LDLCALC 63 04/21/2019 1334    Physical Exam:    VS:  BP 128/90 (BP Location: Left Arm, Patient Position: Sitting, Cuff Size: Large)   Pulse 82   Ht 5' 3"  (1.6 m)   Wt 258 lb 9.6 oz (117.3 kg)   SpO2 95%   BMI 45.81 kg/m     Wt Readings from Last 3 Encounters:  05/31/22 258 lb 9.6 oz (117.3 kg)  04/28/22 273 lb 2.4 oz (123.9 kg)  04/19/22 252 lb (114.3 kg)     GEN:  Well nourished, well developed in no acute distress, nasal canula HEENT: Normal NECK:  No JVD; No carotid bruits CARDIAC: RRR, no murmurs, rubs, gallops RESPIRATORY:  decreased breath sounds at bases. ABDOMEN: Soft, non-tender, distended MUSCULOSKELETAL:  1+ edema; No deformity  SKIN: Warm and dry NEUROLOGIC:  Alert and oriented x 3 PSYCHIATRIC:  Normal affect   ASSESSMENT:    1. Coronary artery disease involving native coronary artery of native heart without angina pectoris   2. Ventricular fibrillation ([place])   3. Primary hypertension   4. Ischemic cardiomyopathy    PLAN:     CAD (PCI to RCA 2019, PCI to LAD and LCx 08/2021).  Denies chest pain.  Continue aspirin, Plavix, Lipitor 40.   Imdur to 15 mg daily.  LDL at goal. hx ventricular fibrillation.  ?ischemia induced s/p PCI to LAD and left circumflex.  CMR 26%, anterior and lateral wall, endocardial scar.    Previously evaluated by EP, not suitable candidate for ICD. Hypertension, BP controlled.  Continue Toprol-XL, losartan 12.5 mg daily.  Ischemic cardiomyopathy, CMR with EF 26%.  BP low with dialysis. Reduce Toprol xl to 56m qd, Losartan 12.5 mg daily, farxiga 159m Cont torsemide, add metolazone5m58md. Continue volume control with hemodialysis.  Referral to advanced heart failure clinic previously placed.  Follow-up in 6 weeks   Medication Adjustments/Labs and Tests Ordered: Current medicines are reviewed at length with the patient today.  Concerns regarding medicines are outlined above.  No orders of the defined types were placed in this encounter.  No orders of the defined types were placed in this encounter.     There are no Patient Instructions on file for this visit.   Signed, Kate Sable, MD  05/31/2022 12:03 PM    [place]

## 2022-06-07 ENCOUNTER — Ambulatory Visit (INDEPENDENT_AMBULATORY_CARE_PROVIDER_SITE_OTHER): Payer: Medicare Other | Admitting: Nurse Practitioner

## 2022-06-07 ENCOUNTER — Encounter (INDEPENDENT_AMBULATORY_CARE_PROVIDER_SITE_OTHER): Payer: Medicare Other

## 2022-06-12 ENCOUNTER — Other Ambulatory Visit: Payer: Self-pay | Admitting: Dermatology

## 2022-06-12 ENCOUNTER — Encounter (HOSPITAL_COMMUNITY): Payer: Medicare Other | Admitting: Cardiology

## 2022-06-14 ENCOUNTER — Telehealth: Payer: Self-pay | Admitting: Cardiology

## 2022-06-14 ENCOUNTER — Other Ambulatory Visit: Payer: Self-pay | Admitting: Nurse Practitioner

## 2022-06-14 NOTE — Telephone Encounter (Signed)
Requested medication (s) are due for refill today - no  Requested medication (s) are on the active medication list -yes  Future visit scheduled -no  Last refill: 02/20/22 #30 4RF  Notes to clinic: non delegated Rx  Requested Prescriptions  Pending Prescriptions Disp Refills   BELSOMRA 10 MG TABS [Pharmacy Med Name: BELSOMRA 10 MG TABLET] 30 tablet 0    Sig: TAKE 1 TABLET BY MOUTH AT BEDTIME AS NEEDED.     Off-Protocol Failed - 06/14/2022  1:52 PM      Failed - Medication not assigned to a protocol, review manually.      Passed - Valid encounter within last 12 months    Recent Outpatient Visits           3 months ago Type 2 diabetes mellitus with hyperglycemia, with long-term current use of insulin ([place])   [place], Jolene T, NP   4 months ago Type 2 diabetes mellitus with hyperglycemia, with long-term current use of insulin ([place])   [place], Barbaraann Faster, NP       Future Appointments             In 1 month Agbor-Etang, Aaron Edelman, MD [facility]   In 1 month Ralene Bathe, MD [place]   In 2 months Parrett, Fonnie Mu, NP [facility]   In 7 months Stoioff, Ronda Fairly, MD [place]               Requested Prescriptions  Pending Prescriptions Disp Refills   [place] 10 MG TABS [Pharmacy Med Name: BELSOMRA 10 MG TABLET] 30 tablet 0    Sig: TAKE 1 TABLET BY MOUTH AT BEDTIME AS NEEDED.     Off-Protocol Failed - 06/14/2022  1:52 PM      Failed - Medication not assigned to a protocol, review manually.      Passed - Valid encounter within last 12 months    Recent Outpatient Visits           3 months ago Type 2 diabetes mellitus with hyperglycemia, with long-term current use of insulin ([place])   [place], Jolene T, NP   4 months ago Type 2 diabetes mellitus with hyperglycemia, with long-term current use of insulin ([place])   [place], Barbaraann Faster, NP       Future Appointments             In 1 month Agbor-Etang, Aaron Edelman, MD [facility], [facility]   In 1 month Ralene Bathe, MD [place]   In 2 months Parrett, Fonnie Mu, NP [place]   In 7 months [place], Ronda Fairly, [place]

## 2022-06-14 NOTE — Telephone Encounter (Signed)
New Message:      [name] wants to know if she needs to discontinue the 50 mg of the Metoprolol and fill the 25 mg of Metoprolol?

## 2022-06-14 NOTE — Telephone Encounter (Signed)
I spoke with pharmacy and confirmed decrease of pt's metoprolol. She is aware to refill Metoprolol succinate 25 mg tablet.

## 2022-06-15 ENCOUNTER — Other Ambulatory Visit (INDEPENDENT_AMBULATORY_CARE_PROVIDER_SITE_OTHER): Payer: Self-pay | Admitting: Vascular Surgery

## 2022-06-15 DIAGNOSIS — N186 End stage renal disease: Secondary | ICD-10-CM

## 2022-06-15 NOTE — Telephone Encounter (Signed)
LVM asking patient to call back to schedule an appt 

## 2022-06-18 NOTE — Telephone Encounter (Signed)
2nd attempt to reach patient.

## 2022-06-21 ENCOUNTER — Encounter (HOSPITAL_COMMUNITY): Payer: Self-pay | Admitting: Cardiology

## 2022-06-21 ENCOUNTER — Ambulatory Visit (INDEPENDENT_AMBULATORY_CARE_PROVIDER_SITE_OTHER): Payer: Medicare Other | Admitting: Nurse Practitioner

## 2022-06-21 ENCOUNTER — Ambulatory Visit (INDEPENDENT_AMBULATORY_CARE_PROVIDER_SITE_OTHER): Payer: Medicare Other

## 2022-06-21 ENCOUNTER — Ambulatory Visit (HOSPITAL_COMMUNITY)
Admission: RE | Admit: 2022-06-21 | Discharge: 2022-06-21 | Disposition: A | Discharge status: Home / Self Care | Payer: Medicare Other | Source: Ambulatory Visit | Attending: Cardiology | Admitting: Cardiology

## 2022-06-21 VITALS — BP 150/100 | HR 92 | Wt 242.4 lb

## 2022-06-21 DIAGNOSIS — I5022 Chronic systolic (congestive) heart failure: Secondary | ICD-10-CM | POA: Insufficient documentation

## 2022-06-21 DIAGNOSIS — Z79899 Other long term (current) drug therapy: Secondary | ICD-10-CM | POA: Diagnosis not present

## 2022-06-21 DIAGNOSIS — N186 End stage renal disease: Secondary | ICD-10-CM | POA: Diagnosis not present

## 2022-06-21 DIAGNOSIS — I428 Other cardiomyopathies: Secondary | ICD-10-CM | POA: Diagnosis not present

## 2022-06-21 DIAGNOSIS — I5042 Chronic combined systolic (congestive) and diastolic (congestive) heart failure: Secondary | ICD-10-CM | POA: Diagnosis not present

## 2022-06-21 DIAGNOSIS — I132 Hypertensive heart and chronic kidney disease with heart failure and with stage 5 chronic kidney disease, or end stage renal disease: Secondary | ICD-10-CM | POA: Diagnosis not present

## 2022-06-21 DIAGNOSIS — Z7982 Long term (current) use of aspirin: Secondary | ICD-10-CM | POA: Diagnosis not present

## 2022-06-21 DIAGNOSIS — I2510 Atherosclerotic heart disease of native coronary artery without angina pectoris: Secondary | ICD-10-CM | POA: Diagnosis not present

## 2022-06-21 DIAGNOSIS — E1122 Type 2 diabetes mellitus with diabetic chronic kidney disease: Secondary | ICD-10-CM | POA: Diagnosis not present

## 2022-06-21 DIAGNOSIS — E875 Hyperkalemia: Secondary | ICD-10-CM | POA: Diagnosis not present

## 2022-06-21 DIAGNOSIS — R059 Cough, unspecified: Secondary | ICD-10-CM | POA: Insufficient documentation

## 2022-06-21 MED ORDER — ENTRESTO 24-26 MG PO TABS: 1.0000 | ORAL_TABLET | Freq: Two times a day (BID) | ORAL | 3 refills | Status: DC

## 2022-06-21 NOTE — Progress Notes (Signed)
H&V Care Navigation CSW Progress Note  Clinical Social Worker met with patient to help with transportation.    SDOH Screenings   Alcohol Screen: Low Risk  (03/05/2022)   Alcohol Screen    Last Alcohol Screening Score (AUDIT): 0  Depression (PHQ2-9): Medium Risk (03/05/2022)   Depression (PHQ2-9)    PHQ-2 Score: 7  Financial Resource Strain: Low Risk  (03/05/2022)   Overall Financial Resource Strain (CARDIA)    Difficulty of Paying Living Expenses: Not hard at all  Food Insecurity: No Food Insecurity (03/05/2022)   Hunger Vital Sign    Worried About Running Out of Food in the Last Year: Never true    [place] in the Last Year: Never true  Housing: Low Risk  (03/05/2022)   Housing    Last Housing Risk Score: 0  Physical Activity: Insufficiently Active (03/05/2022)   Exercise Vital Sign    Days of Exercise per Week: 3 days    Minutes of Exercise per Session: 30 min  Social Connections: Unknown (03/05/2022)   Social Connection and Isolation Panel [NHANES]    Frequency of Communication with Friends and Family: More than three times a week    Frequency of Social Gatherings with Friends and Family: Once a week    Attends Religious Services: Never    Marine scientist or Organizations: No    Attends Archivist Meetings: Never    Marital Status: Not on file  Stress: No Stress Concern Present (03/05/2022)   [place]    Feeling of Stress : Not at all  Tobacco Use: Low Risk  (06/21/2022)   Patient History    Smoking Tobacco Use: Never    Smokeless Tobacco Use: Never    Passive Exposure: Not on file  Transportation Needs: Unmet Transportation Needs (05/30/2022)   PRAPARE - Hydrologist (Medical): Yes    Lack of Transportation (Non-Medical): Yes    Assisted in getting return ride home set up through insurance.  Will continue to follow and assist as needed  Jorge Ny,  [facility] Desk#: [phone] Cell#: [phone]

## 2022-06-21 NOTE — Patient Instructions (Signed)
Medication Changes:  STOP Losartan  START Entresto 24/26 mg Twice daily   Lab Work:  None  Testing/Procedures:  HD needs to be increased to 4 times a week  Referrals:  You have been referred to Cardiac Rehab at [facility], they will call you to schedule  Special Instructions // Education:  Do the following things EVERYDAY: Weigh yourself in the morning before breakfast. Write it down and keep it in a log. Take your medicines as prescribed Eat low salt foods--Limit salt (sodium) to 2000 mg per day.  Stay as active as you can everyday Limit all fluids for the day to less than 2 liters  Follow-Up in: 3 months  At the [facility], you and your health needs are our priority. We have a designated team specialized in the treatment of Heart Failure. This Care Team includes your primary Heart Failure Specialized Cardiologist (physician), Advanced Practice Providers (APPs- Physician Assistants and Nurse Practitioners), and Pharmacist who all work together to provide you with the care you need, when you need it.   You may see any of the following providers on your designated Care Team at your next follow up:  Dr Glori Bickers Dr Haynes Kerns, NP Lyda Jester, [place] [facility] [place], [place] Audry Riles, PharmD   Please be sure to bring in all your medications bottles to every appointment.   Need to Contact us:  If you have any questions or concerns before your next appointment please send [place] a message through [place] or call our office at [phone].    TO LEAVE A MESSAGE FOR THE NURSE SELECT OPTION 2, PLEASE LEAVE A MESSAGE INCLUDING: YOUR NAME DATE OF BIRTH CALL BACK NUMBER REASON FOR CALL**this is important as we prioritize the call backs  YOU WILL RECEIVE A CALL BACK THE SAME DAY AS LONG AS YOU CALL BEFORE 4:00 PM

## 2022-06-21 NOTE — Progress Notes (Signed)
PCP: Venita Lick, NP Cardiology: Dr. Garen Lah HF Cardiology: Dr. Aundra Dubin  56 y.o. with history of CAD, ESRD, DM2, and chronic systolic CHF was referred by Dr. Garen Lah for evaluation of CHF.  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 Cardiac MRI in 3/23 showed LV EF 26%, RV EF 35%, subendocardial LGE in the anterior and lateral walls (coronary disease pattern), mid-wall LGE in the septum (?NICM component).  He had coronary intervention to the RCA in 2019 and most recently to the the LAD and LCx in 9/22 after a VF arrest. He has had ESRD for about a year.  He is now chronically wearing 2 L home oxygen.  He has had a chronic cough.  He stopped Entresto because of this but it did not help his cough.   Patient reports significant fatigue.  He is dyspneic walking short distances, just around his house.  He tolerates dialysis without much hypotension.  He has orthopnea, sleeps best in recliner. He is takes torsemide and metolazone on non-HD days, but says that he makes very little urine.    ECG (personally reviewed): NSR, right superior axis, nonspecific T wave flattening.   PMH:  1. CAD: PCI to RCA in 2019. PCI to LAD and LCx in 9/22.  2. VF arrest 2022 3. HTN 4. Type 2 diabetes 5. ESRD: Due to diabetes and hypertension.  6. OSA: Uses CPAP.  7. Chronic systolic CHF: Suspect mixed ischemic/nonischemic cardiomyopathy.   - Cardiac MRI in 3/23 showed LV EF 26%, RV EF 35%, subendocardial LGE in the anterior and lateral walls (coronary disease pattern), mid-wall LGE in the septum (?NICM component).   - Hyperkalemia with spironolactone.   Social History   Socioeconomic History   Marital status: Single    Spouse name: Not on file   Number of children: Not on file   Years of education: Not on file   Highest education level: Not on file  Occupational History    Comment: disabled  Tobacco Use   Smoking status: Never   Smokeless tobacco: Never   Vaping Use   Vaping Use: Never used  Substance and Sexual Activity   Alcohol use: Not Currently    Alcohol/week: 1.0 standard drink of alcohol    Types: 1 Cans of beer per week    Comment: rare occassion   Drug use: Not Currently    Types: Marijuana    Comment: in high school    Sexual activity: Not on file  Other Topics Concern   Not on file  Social History Narrative   Patient uses continuous oxygen at 3L/min   Dialysis, AV fistula in L arm   Lives at home by himself ; friend gives pt. Transportation    Social Determinants of Health   Financial Resource Strain: Low Risk  (03/05/2022)   Overall Financial Resource Strain (CARDIA)    Difficulty of Paying Living Expenses: Not hard at all  Food Insecurity: No Food Insecurity (03/05/2022)   Hunger Vital Sign    Worried About Running Out of Food in the Last Year: Never true    Ran Out of Food in the Last Year: Never true  Transportation Needs: Unmet Transportation Needs (05/30/2022)   PRAPARE - Transportation    Lack of Transportation (Medical): Yes    Lack of Transportation (Non-Medical): Yes  Physical Activity: Insufficiently Active (03/05/2022)   Exercise Vital Sign    Days of Exercise per Week: 3 days    Minutes of Exercise  per Session: 30 min  Stress: No Stress Concern Present (03/05/2022)   [place]    Feeling of Stress : Not at all  Social Connections: Unknown (03/05/2022)   Social Connection and Isolation Panel [NHANES]    Frequency of Communication with Friends and Family: More than three times a week    Frequency of Social Gatherings with Friends and Family: Once a week    Attends Religious Services: Never    Marine scientist or Organizations: No    Attends Archivist Meetings: Never    Marital Status: Not on file  Intimate Partner Violence: Not At Risk (03/05/2022)   Humiliation, Afraid, Rape, and Kick questionnaire    Fear of Current  or Ex-Partner: No    Emotionally Abused: No    Physically Abused: No    Sexually Abused: No   Family History  Problem Relation Age of Onset   Heart failure Mother    Kidney failure Brother    ROS: All systems reviewed and negative except as per HPI.   Current Outpatient Medications  Medication Sig Dispense Refill   Accu-Chek Softclix Lancets lancets Use to check blood sugars 2-3 times daily with goals = <130 fasting in morning and <180 two hours after eating.  Bring blood sugar log to appointments. 100 each 12   albuterol (PROVENTIL HFA;VENTOLIN HFA) 108 (90 Base) MCG/ACT inhaler Inhale 2 puffs into the lungs every 4 (four) hours as needed for wheezing or shortness of breath. 6.7 g 5   allopurinol (ZYLOPRIM) 100 MG tablet Take 100 mg by mouth daily.     aspirin EC 81 MG tablet Take 81 mg by mouth in the morning. Swallow whole.     atorvastatin (LIPITOR) 40 MG tablet Take 1 tablet (40 mg total) by mouth at bedtime. 90 tablet 1   BELSOMRA 10 MG TABS TAKE 1 TABLET BY MOUTH AT BEDTIME AS NEEDED. 30 tablet 0   benzonatate (TESSALON) 200 MG capsule Take 1 capsule (200 mg total) by mouth 3 (three) times daily as needed for cough. 30 capsule 0   Blood Glucose Monitoring Suppl (ACCU-CHEK GUIDE ME) w/Device KIT Use to check blood sugars 2-3 times daily with goals = <130 fasting in morning and <180 two hours after eating.  Bring blood sugar log to appointments. 1 kit 0   calcium acetate (PHOSLO) 667 MG capsule Take 667 mg by mouth 3 (three) times daily with meals.     Cholecalciferol (D3-1000) 25 MCG (1000 UT) tablet Take 2 tablets (2,000 Units total) by mouth daily. 60 tablet 3   clopidogrel (PLAVIX) 75 MG tablet Take 75 mg by mouth daily.     cyclobenzaprine (FLEXERIL) 5 MG tablet Take 5 mg by mouth at bedtime as needed.     dapagliflozin propanediol (FARXIGA) 10 MG TABS tablet Take 1 tablet (10 mg total) by mouth daily before breakfast. 30 tablet 3   docusate sodium (COLACE) 100 MG capsule Take  100 mg by mouth 2 (two) times daily. As needed     gabapentin (NEURONTIN) 300 MG capsule Take 300 mg by mouth at bedtime.     glimepiride (AMARYL) 4 MG tablet Take 4 mg by mouth daily with breakfast.     glucose blood test strip Use to check blood sugars 2-3 times daily with goals = <130 fasting in morning and <180 two hours after eating.  Bring blood sugar log to appointments. 100 each 12   insulin degludec (  TRESIBA FLEXTOUCH) 100 UNIT/ML FlexTouch Pen Inject 20 Units into the skin at bedtime. 6 mL 3   isosorbide mononitrate (IMDUR) 30 MG 24 hr tablet Take 0.5 tablets (15 mg total) by mouth daily. 15 tablet 2   lidocaine (XYLOCAINE) 2 % jelly 1 application. by Other route every 6 (six) hours. 30 mL 0   metolazone (ZAROXOLYN) 5 MG tablet Take 1 tablet (5 mg total) by mouth daily. 30 tablet 3   metoprolol succinate (TOPROL-XL) 25 MG 24 hr tablet Take 1 tablet (25 mg total) by mouth daily. 90 tablet 1   Multiple Vitamins-Minerals (TAB-A-VITE) TABS TAKE 1 TABLET BY MOUTH ONCE DAILY. 90 tablet 3   ondansetron (ZOFRAN) 4 MG tablet Take 1 tablet (4 mg total) by mouth every 8 (eight) hours as needed for nausea or vomiting. 20 tablet 3   OXYGEN Inhale 3 L into the lungs daily.     pantoprazole (PROTONIX) 40 MG tablet Take 1 tablet (40 mg total) by mouth daily. 90 tablet 0   polyethylene glycol (MIRALAX / GLYCOLAX) 17 g packet Take 17 g by mouth 2 (two) times daily.     Risankizumab-rzaa (SKYRIZI) 150 MG/ML SOSY Inject 150 mg into the skin as directed. Every 12 weeks for maintenance. 1 mL 1   sacubitril-valsartan (ENTRESTO) 24-26 MG Take 1 tablet by mouth 2 (two) times daily. 60 tablet 3   silodosin (RAPAFLO) 4 MG CAPS capsule TAKE 1 CAPSULE ONCE DAILY WITH BREAKFAST 90 capsule 3   tiZANidine (ZANAFLEX) 2 MG tablet Take 1 tablet (2 mg total) by mouth daily as needed for muscle spasms. 30 tablet 3   Torsemide 40 MG TABS Take TWO tablets (80 mg) TWICE daily only on days you DO NOT have dialysis: Tuesday,  Thursday, Saturday and Sunday 180 tablet 3   triamcinolone (KENALOG) 0.025 % cream Apply 1 application topically daily in the afternoon.     TRULICITY 3 GB/1.5VV SOPN Inject 3 mg into the skin every Thursday.     umeclidinium-vilanterol (ANORO ELLIPTA) 62.5-25 MCG/ACT AEPB Inhale 1 puff into the lungs daily.     ZORYVE 0.3 % CREA APPLY AS DIRECTED TO AFFECTED AREAS OF PSORIASIS ONCE DAILY UNTIL CLEAR, THEN USE AS NEEDED FOR FLARES 180 g 0   No current facility-administered medications for this encounter.   BP (!) 150/100   Pulse 92   Wt 110 kg (242 lb 6.4 oz)   SpO2 91% Comment: Patient is on 3-liters of room oxygen.  BMI 42.94 kg/m  General: NAD Neck: JVP 14+ cm, no thyromegaly or thyroid nodule.  Lungs: Clear to auscultation bilaterally with normal respiratory effort. CV: Nondisplaced PMI.  Heart regular S1/S2, no S3/S4, no murmur.  1+ edema to knees.  No carotid bruit.  Difficult to palpate pedal pulses.  Abdomen: Soft, nontender, no hepatosplenomegaly, no distention.  Skin: Intact without lesions or rashes.  Neurologic: Alert and oriented x 3.  Psych: Normal affect. Extremities: No clubbing or cyanosis.  HEENT: Normal.   Assessment/Plan: 1. Chronic systolic CHF: Mixed ischemic/nonischemic cardiomyopathy.  Cardiac MRI in 3/23 showed LV EF 26%, RV EF 35%, subendocardial LGE in the anterior and lateral walls (coronary disease pattern), mid-wall LGE in the septum (?NICM component).  NYHA class IIIb symptoms.  He is very volume overloaded on exam and is likely on home oxygen due to chronic volume overload. I suspect that his chronic cough is also due to volume overload.  He has been taking torsemide and metolazone on non-HD days with minimal response.   -  I think he can likely stop torsemide and metolazone as he makes very little urine.  - The only way to get fluid off and improve his symptoms is going to be more intensive dialysis.  He needs to either dialyze 4 times a week or have more  aggressive ultrafiltration during a given HD session or a combination of both.  He likely has 10-15 lbs of excess fluid on him.  - He has seen EP for ICD evaluation, thought not to be a candidate.  He is not a candidate for CRT.  - There is minimal data on GDMT use in ESRD patients.  However, he seems to tolerate cardiac meds without excessive hypotension at dialysis.  Would continue current Toprol XL and dapagliflozin.  I do not think Entresto caused his cough, therefore will have him stop losartan and start Entresto 24/26 bid.  - He did not tolerate spironolactone due to hyperkalemia.  - Long-term, heart/kidney transplant could be a consideration but he would need to lose a lot of weight.  - Ref[facility] for cardiac re[facility] to [facility].  2. CAD: No recent chest pain.  - Continue atorvastatin, check lipids.  - Continue ASA 81 daily.  3. ESRD: See discussion about regarding intensification of fluid removal.   Followup in 3 months.   Loralie Champagne 06/21/2022

## 2022-06-30 NOTE — Patient Instructions (Incomplete)
Use saline nasal spray twice a day.  Do not use Flonase as this will make nose bleed more.  Diabetes Mellitus and Nutrition, Adult When you have diabetes, or diabetes mellitus, it is very important to have healthy eating habits because your blood sugar (glucose) levels are greatly affected by what you eat and drink. Eating healthy foods in the right amounts, at about the same times every day, can help you: Manage your blood glucose. Lower your risk of heart disease. Improve your blood pressure. Reach or maintain a healthy weight. What can affect my meal plan? Every person with diabetes is different, and each person has different needs for a meal plan. Your health care provider may recommend that you work with a dietitian to make a meal plan that is best for you. Your meal plan may vary depending on factors such as: The calories you need. The medicines you take. Your weight. Your blood glucose, blood pressure, and cholesterol levels. Your activity level. Other health conditions you have, such as heart or kidney disease. How do carbohydrates affect me? Carbohydrates, also called carbs, affect your blood glucose level more than any other type of food. Eating carbs raises the amount of glucose in your blood. It is important to know how many carbs you can safely have in each meal. This is different for every person. Your dietitian can help you calculate how many carbs you should have at each meal and for each snack. How does alcohol affect me? Alcohol can cause a decrease in blood glucose (hypoglycemia), especially if you use insulin or take certain diabetes medicines by mouth. Hypoglycemia can be a life-threatening condition. Symptoms of hypoglycemia, such as sleepiness, dizziness, and confusion, are similar to symptoms of having too much alcohol. Do not drink alcohol if: Your health care provider tells you not to drink. You are pregnant, may be pregnant, or are planning to become pregnant. If  you drink alcohol: Limit how much you have to: 0-1 drink a day for women. 0-2 drinks a day for men. Know how much alcohol is in your drink. In the [place] one drink equals one 12 oz bottle of beer (355 mL), one 5 oz glass of wine (148 mL), or one 1 oz glass of hard liquor (44 mL). Keep yourself hydrated with water, diet soda, or unsweetened iced tea. Keep in mind that regular soda, juice, and other mixers may contain a lot of sugar and must be counted as carbs. What are tips for following this plan?  Reading food labels Start by checking the serving size on the Nutrition Facts label of packaged foods and drinks. The number of calories and the amount of carbs, fats, and other nutrients listed on the label are based on one serving of the item. Many items contain more than one serving per package. Check the total grams (g) of carbs in one serving. Check the number of grams of saturated fats and trans fats in one serving. Choose foods that have a low amount or none of these fats. Check the number of milligrams (mg) of salt (sodium) in one serving. Most people should limit total sodium intake to less than 2,300 mg per day. Always check the nutrition information of foods labeled as "low-fat" or "nonfat." These foods may be higher in added sugar or refined carbs and should be avoided. Talk to your dietitian to identify your daily goals for nutrients listed on the label. Shopping Avoid buying canned, pre-made, or processed foods. These foods tend to be  high in fat, sodium, and added sugar. Shop around the outside edge of the grocery store. This is where you will most often find fresh fruits and vegetables, bulk grains, fresh meats, and fresh dairy products. Cooking Use low-heat cooking methods, such as baking, instead of high-heat cooking methods, such as deep frying. Cook using healthy oils, such as olive, canola, or sunflower oil. Avoid cooking with butter, cream, or high-fat meats. Meal planning Eat  meals and snacks regularly, preferably at the same times every day. Avoid going long periods of time without eating. Eat foods that are high in fiber, such as fresh fruits, vegetables, beans, and whole grains. Eat 4-6 oz (112-168 g) of lean protein each day, such as lean meat, chicken, fish, eggs, or tofu. One ounce (oz) (28 g) of lean protein is equal to: 1 oz (28 g) of meat, chicken, or fish. 1 egg.  cup (62 g) of tofu. Eat some foods each day that contain healthy fats, such as avocado, nuts, seeds, and fish. What foods should I eat? Fruits Berries. Apples. Oranges. Peaches. Apricots. Plums. Grapes. Mangoes. Papayas. Pomegranates. Kiwi. Cherries. Vegetables Leafy greens, including lettuce, spinach, kale, chard, collard greens, mustard greens, and cabbage. Beets. Cauliflower. Broccoli. Carrots. Green beans. Tomatoes. Peppers. Onions. Cucumbers. Brussels sprouts. Grains Whole grains, such as whole-wheat or whole-grain bread, crackers, tortillas, cereal, and pasta. Unsweetened oatmeal. Quinoa. Brown or wild rice. Meats and other proteins Seafood. Poultry without skin. Lean cuts of poultry and beef. Tofu. Nuts. Seeds. Dairy Low-fat or fat-free dairy products such as milk, yogurt, and cheese. The items listed above may not be a complete list of foods and beverages you can eat and drink. Contact a dietitian for more information. What foods should I avoid? Fruits Fruits canned with syrup. Vegetables Canned vegetables. Frozen vegetables with butter or cream sauce. Grains Refined white flour and flour products such as bread, pasta, snack foods, and cereals. Avoid all processed foods. Meats and other proteins Fatty cuts of meat. Poultry with skin. Breaded or fried meats. Processed meat. Avoid saturated fats. Dairy Full-fat yogurt, cheese, or milk. Beverages Sweetened drinks, such as soda or iced tea. The items listed above may not be a complete list of foods and beverages you should avoid.  Contact a dietitian for more information. Questions to ask a health care provider Do I need to meet with a certified diabetes care and education specialist? Do I need to meet with a dietitian? What number can I call if I have questions? When are the best times to check my blood glucose? Where to find more information: American Diabetes Association: diabetes.org Academy of Nutrition and Dietetics: eatright.[employer] of Diabetes and Digestive and Kidney Diseases: StageSync.si Association of Diabetes Care & Education Specialists: diabeteseducator.org Summary It is important to have healthy eating habits because your blood sugar (glucose) levels are greatly affected by what you eat and drink. It is important to use alcohol carefully. A healthy meal plan will help you manage your blood glucose and lower your risk of heart disease. Your health care provider may recommend that you work with a dietitian to make a meal plan that is best for you. This information is not intended to replace advice given to you by your health care provider. Make sure you discuss any questions you have with your health care provider. Document Revised: 07/06/2020 Document Reviewed: 07/06/2020 Elsevier Patient Education  2023 [employer].

## 2022-07-02 ENCOUNTER — Ambulatory Visit: Payer: Medicare Other | Admitting: Podiatry

## 2022-07-04 ENCOUNTER — Ambulatory Visit (INDEPENDENT_AMBULATORY_CARE_PROVIDER_SITE_OTHER): Payer: Medicare Other | Admitting: Nurse Practitioner

## 2022-07-04 ENCOUNTER — Encounter: Payer: Self-pay | Admitting: Nurse Practitioner

## 2022-07-04 VITALS — BP 120/76 | HR 92 | Temp 98.3°F | Ht 63.0 in | Wt 246.2 lb

## 2022-07-04 DIAGNOSIS — E113299 Type 2 diabetes mellitus with mild nonproliferative diabetic retinopathy without macular edema, unspecified eye: Secondary | ICD-10-CM | POA: Diagnosis not present

## 2022-07-04 DIAGNOSIS — Z992 Dependence on renal dialysis: Secondary | ICD-10-CM

## 2022-07-04 DIAGNOSIS — J449 Chronic obstructive pulmonary disease, unspecified: Secondary | ICD-10-CM

## 2022-07-04 DIAGNOSIS — E1169 Type 2 diabetes mellitus with other specified complication: Secondary | ICD-10-CM

## 2022-07-04 DIAGNOSIS — Z6841 Body Mass Index (BMI) 40.0 and over, adult: Secondary | ICD-10-CM

## 2022-07-04 DIAGNOSIS — J9611 Chronic respiratory failure with hypoxia: Secondary | ICD-10-CM

## 2022-07-04 DIAGNOSIS — E1165 Type 2 diabetes mellitus with hyperglycemia: Secondary | ICD-10-CM | POA: Diagnosis not present

## 2022-07-04 DIAGNOSIS — E662 Morbid (severe) obesity with alveolar hypoventilation: Secondary | ICD-10-CM

## 2022-07-04 DIAGNOSIS — I5042 Chronic combined systolic (congestive) and diastolic (congestive) heart failure: Secondary | ICD-10-CM

## 2022-07-04 DIAGNOSIS — E538 Deficiency of other specified B group vitamins: Secondary | ICD-10-CM

## 2022-07-04 DIAGNOSIS — G4733 Obstructive sleep apnea (adult) (pediatric): Secondary | ICD-10-CM

## 2022-07-04 DIAGNOSIS — I25118 Atherosclerotic heart disease of native coronary artery with other forms of angina pectoris: Secondary | ICD-10-CM

## 2022-07-04 DIAGNOSIS — N186 End stage renal disease: Secondary | ICD-10-CM

## 2022-07-04 DIAGNOSIS — F5104 Psychophysiologic insomnia: Secondary | ICD-10-CM

## 2022-07-04 DIAGNOSIS — Z794 Long term (current) use of insulin: Secondary | ICD-10-CM

## 2022-07-04 DIAGNOSIS — I4901 Ventricular fibrillation: Secondary | ICD-10-CM

## 2022-07-04 DIAGNOSIS — N400 Enlarged prostate without lower urinary tract symptoms: Secondary | ICD-10-CM

## 2022-07-04 DIAGNOSIS — E785 Hyperlipidemia, unspecified: Secondary | ICD-10-CM

## 2022-07-04 DIAGNOSIS — I429 Cardiomyopathy, unspecified: Secondary | ICD-10-CM

## 2022-07-04 DIAGNOSIS — M1A09X0 Idiopathic chronic gout, multiple sites, without tophus (tophi): Secondary | ICD-10-CM

## 2022-07-04 LAB — BAYER DCA HB A1C WAIVED: HB A1C (BAYER DCA - WAIVED): 5.7 % — ABNORMAL HIGH (ref 4.8–5.6)

## 2022-07-04 NOTE — Assessment & Plan Note (Signed)
Chronic, ongoing.  At this time continue Allopurinol -- will renal dose this in future = decrease to 50 MG.  Obtains labs at dialysis.

## 2022-07-04 NOTE — Assessment & Plan Note (Signed)
Chronic, ongoing with diagnosis of COPD in records.  Was followed by pulmonary and past and returning in August.  Continue Anoro and Albuterol.

## 2022-07-04 NOTE — Assessment & Plan Note (Signed)
BMI 43.61 with T2DM, HTN/HLD.  Recommended eating smaller high protein, low fat meals more frequently and exercising 30 mins a day 5 times a week with a goal of 10-15lb weight loss in the next 3 months. Patient voiced their understanding and motivation to adhere to these recommendations.

## 2022-07-04 NOTE — Assessment & Plan Note (Signed)
Chronic, ongoing with diagnosis of COPD in records.  Was followed by pulmonary and past, returning in August.  At this time continue Anoro and Albuterol.

## 2022-07-04 NOTE — Assessment & Plan Note (Signed)
Chronic and ongoing, followed by cardiology and HF clinic.  Recent notes reviewed and will continue this collaboration + medications as ordered by them.  He denies any palpitations or CP today.

## 2022-07-04 NOTE — Assessment & Plan Note (Signed)
Chronic, ongoing.  Continue current medication regimen and adjust as needed. Lipid panel today. 

## 2022-07-04 NOTE — Assessment & Plan Note (Signed)
Chronic, ongoing.  New referral to opthalmology placed per patient request.

## 2022-07-04 NOTE — Assessment & Plan Note (Signed)
Chronic, ongoing with recent A1c today 5.7% - trend down.  At this time recommend continue Tresiba 20 units every night, Trulicity, and Farxiga, but discontinue Glimepiride to prevent hypoglycemia.  May increase Trulicity next visit to 4.5 MG weekly if sugars trend up.  Would benefit from [facility], referral placed to [facility] past visit.  He reports he can not poke fingers.  Recommend focus on diabetic diet at home.  Return in 3 months.

## 2022-07-04 NOTE — Assessment & Plan Note (Signed)
Chronic, ongoing, scheduled to see pulmonary in August.  Recommend he continue use of O2 and Trilogy.

## 2022-07-04 NOTE — Progress Notes (Signed)
BP 120/76   Pulse 92   Temp 98.3 F (36.8 C) (Oral)   Ht '5\' 3"'$  (1.6 m)   Wt 246 lb 3.2 oz (111.7 kg)   SpO2 95%   BMI 43.61 kg/m    Subjective:    Patient ID: [name], male    DOB: 01-25-1966, 56 y.o.   MRN: [id number]  HPI: [name] is a 56 y.o. male  Chief Complaint  Patient presents with   Diabetes   Congestive Heart Failure    Patient says his Cardiologist changed him to 4 times a week for Dialysis and he says it has helped.    Hyperlipidemia   Epistaxis    Patient says recently had a nosebleed from a extend timeframe. Patient says he had the urge to cough and when he cough he coughed up a clot. Patient says when he went to blow his nose he noticed another clot. Patient says he had a bowel movement and noticed a blood in his stool as well.    Referral    Patient says he would like to discuss referrals to an Eye Doctor and Dentist at today's visit.    DIABETES Last saw Dr. Honor Junes with endo 12/13/20, he refused to go back to him. Last A1c on 01/29/22 == 8.3%.  He takes [place] 20 units, Trulicity 3 MG, Farxiga 10 MG, and Glimepiride 4 MG.   Use to take Lantus, Metformin.   Hypoglycemic episodes:no Polydipsia/polyuria: no Visual disturbance: no Chest pain: no Paresthesias: no Glucose Monitoring: no             Accucheck frequency: Not Checking             Fasting glucose:             Post prandial:             Evening:             Before meals: Taking Insulin?: yes             Long acting insulin: 20 units every night             Short acting insulin: Blood Pressure Monitoring: not checking Retinal Examination: Up to Date -- [place] Exam: Up to Date -- [place] Pneumovax: Up to Date Influenza: Up to Date Aspirin: yes   HYPERTENSION / [facility] with last visit 05/31/22 -- had coronary stent placement 08/28/21.  Has underlying ventricular fibrillation, CAD, and HF. Continues ASA,  Atorvastatin, Plavix, Imdur, Metoprolol, Entresto, Torsemide.    On July 10th he had episode of epistaxis that lasted full day which led to blood in stool, has noticed occasional spots with wiping after BM since then but no large amount of bleeding.  Had two clots come out with bloody nose.  Did have rectal bleeding episode and was admitted to hospital in May 2023 -- colonoscopy in March 2023. Does wear O2 daily. Satisfied with current treatment? yes Duration of hypertension: chronic BP monitoring frequency: not checking BP range:  BP medication side effects: no Past BP meds:  Duration of hyperlipidemia: chronic Cholesterol medication side effects: no Cholesterol supplements: none Past cholesterol medications:  Medication compliance: good compliance Aspirin: yes Recent stressors: no Recurrent headaches: no Visual changes: no Palpitations: no Dyspnea: occasional Chest pain: no Lower extremity edema: no Dizzy/lightheaded: no   VENTRICULAR FIBRILLATION Was told heart was too weak for defibrillator, saw specialist at HF clinic on 06/21/22 -- they  changed medication and recommended going to dialysis 4 times a week, which he has started doing.   Fibrillation status: stable Satisfied with current treatment: yes  Medication side effects:  no Medication compliance: good compliance Etiology of atrial fibrillation:  Palpitations:  no Chest pain:  no Dyspnea on exertion: yes Orthopnea:  no Syncope:  no Edema:  no -- wears compression hose religiously Ventricular rate control: B-blocker Anti-coagulation: aspirin    CHRONIC KIDNEY DISEASE Has been on dialysis - goes Monday, Tuesday, Wednesday, Friday.  They do labs every week -- he will get these faxed.  Dr. Candiss Norse follows him.  Last saw vascular 11/30/21 -- Dr. Delana Meyer.  He is tired with increased dialysis, but is not coughing as much.  Has history of gout flare years ago, none since with Allopurinol on board. CKD status:  stable Medications renally dose: yes Previous renal evaluation: yes Pneumovax:  Up to Date Influenza Vaccine:  Up to Date   INSOMNIA Continues on Belsomra for sleep aide as needed, not nightly. Has OSA severe and wears Triology consistently. Has underlying COPD on chart with inhalers.  Sees pulmonary 07/23/22.   Duration: chronic Satisfied with sleep quality: yes Difficulty falling asleep: yes Difficulty staying asleep: no Waking a few hours after sleep onset: yes Early morning awakenings: no Daytime hypersomnolence: no Wakes feeling refreshed: yes Good sleep hygiene: yes Apnea: yes Snoring: yes Depressed/anxious mood: no Recent stress: no Restless legs/nocturnal leg cramps: no Chronic pain/arthritis: no History of sleep study: yes Treatments attempted: melatonin, uinsom, and ambien    Relevant past medical, surgical, family and social history reviewed and updated as indicated. Interim medical history since our last visit reviewed. Allergies and medications reviewed and updated.  Review of Systems  Constitutional:  Negative for activity change, diaphoresis, fatigue and fever.  Respiratory:  Negative for cough, chest tightness, shortness of breath and wheezing.   Cardiovascular:  Negative for chest pain, palpitations and leg swelling.  Gastrointestinal: Negative.   Endocrine: Negative for cold intolerance, heat intolerance, polydipsia, polyphagia and polyuria.  Musculoskeletal:  Negative for arthralgias.  Neurological: Negative.   Psychiatric/Behavioral:  Positive for sleep disturbance. Negative for decreased concentration, self-injury and suicidal ideas. The patient is not nervous/anxious.     Per HPI unless specifically indicated above     Objective:    BP 120/76   Pulse 92   Temp 98.3 F (36.8 C) (Oral)   Ht '5\' 3"'$  (1.6 m)   Wt 246 lb 3.2 oz (111.7 kg)   SpO2 95%   BMI 43.61 kg/m   Wt Readings from Last 3 Encounters:  07/04/22 246 lb 3.2 oz (111.7 kg)  06/21/22  242 lb 6.4 oz (110 kg)  05/31/22 258 lb 9.6 oz (117.3 kg)    Physical Exam Vitals and nursing note reviewed.  Constitutional:      General: He is awake. He is not in acute distress.    Appearance: He is well-developed and well-groomed. He is morbidly obese. He is not ill-appearing or toxic-appearing.  HENT:     Head: Normocephalic and atraumatic.     Right Ear: Hearing normal. No drainage.     Left Ear: Hearing normal. No drainage.  Eyes:     General: Lids are normal.        Right eye: No discharge.        Left eye: No discharge.     Conjunctiva/sclera: Conjunctivae normal.     Pupils: Pupils are equal, round, and reactive to light.  Neck:  Thyroid: No thyromegaly.     Vascular: No carotid bruit.  Cardiovascular:     Rate and Rhythm: Normal rate and regular rhythm.     Heart sounds: Normal heart sounds, S1 normal and S2 normal. No murmur heard.    No gallop.     Arteriovenous access: Left arteriovenous access is present. Pulmonary:     Effort: Pulmonary effort is normal. No accessory muscle usage or respiratory distress.     Breath sounds: Normal breath sounds.     Comments: O2 by nasal cannula in place, 3L. Abdominal:     General: Bowel sounds are normal. There is no distension.     Palpations: Abdomen is soft.     Tenderness: There is no abdominal tenderness.  Musculoskeletal:        General: Normal range of motion.     Cervical back: Normal range of motion and neck supple.     Right lower leg: No edema.     Left lower leg: No edema.  Lymphadenopathy:     Cervical: No cervical adenopathy.  Skin:    General: Skin is warm and dry.     Capillary Refill: Capillary refill takes less than 2 seconds.     Findings: No rash.  Neurological:     Mental Status: He is alert and oriented to person, place, and time.     Deep Tendon Reflexes: Reflexes are normal and symmetric.  Psychiatric:        Attention and Perception: Attention normal.        Mood and Affect: Mood normal.         Speech: Speech normal.        Behavior: Behavior normal. Behavior is cooperative.        Thought Content: Thought content normal.    Results for orders placed or performed in visit on 07/04/22  Bayer DCA Hb A1c Waived  Result Value Ref Range   HB A1C (BAYER DCA - WAIVED) 5.7 (H) 4.8 - 5.6 %      Assessment & Plan:   Problem List Items Addressed This Visit       Cardiovascular and Mediastinum   CAD (coronary artery disease)    Chronic and ongoing, followed by cardiology and HF clinic.  Recent notes reviewed and will continue this collaboration + medications as ordered by them.      Cardiomyopathy ([place])    Chronic and ongoing, followed by cardiology and HF clinic.  Recent notes reviewed and will continue this collaboration + medications as ordered by them.      Relevant Orders   Lipid Panel w/o Chol/HDL Ratio   Chronic combined systolic (congestive) and diastolic (congestive) heart failure (HCC)    Chronic and ongoing, followed by cardiology and HF clinic.  Euvolemic today -- is performing dialysis 4 days a week with improved cough.  Recent notes reviewed and will continue this collaboration + medications as ordered by them.  Recommend: - Reminded to call for an overnight weight gain of >2 pounds or a weekly weight gain of >5 pounds - not adding salt to food and read food labels. Reviewed the importance of keeping daily sodium intake to '2000mg'$  daily. - Avoid Ibuprofen products       Relevant Orders   TSH   Lipid Panel w/o Chol/HDL Ratio   Ventricular fibrillation (HCC)    Chronic and ongoing, followed by cardiology and HF clinic.  Recent notes reviewed and will continue this collaboration + medications as ordered by them.  He denies any palpitations or CP today.      Relevant Orders   Lipid Panel w/o Chol/HDL Ratio     Respiratory   Chronic respiratory failure with hypoxia (HCC)    Chronic, ongoing with diagnosis of COPD in records.  Was followed by pulmonary and  past, returning in August.  At this time continue Anoro and Albuterol.      COPD (chronic obstructive pulmonary disease) (HCC)    Chronic, ongoing with diagnosis of COPD in records.  Was followed by pulmonary and past and returning in August.  Continue Anoro and Albuterol.      Obstructive sleep apnea    Chronic, ongoing.  Uses Trilogy at home, continue this and collaboration with pulmonary, who he returns to in August.  Would benefit from modest weight loss.        Endocrine   Background diabetic retinopathy associated with type 2 diabetes mellitus (HCC)    Chronic, ongoing.  New referral to opthalmology placed per patient request.      Relevant Medications   TRESIBA FLEXTOUCH 200 UNIT/ML FlexTouch Pen   Other Relevant Orders   Bayer DCA Hb A1c Waived (Completed)   Hyperlipidemia associated with type 2 diabetes mellitus (HCC)    Chronic, ongoing.  Continue current medication regimen and adjust as needed.  Lipid panel today.      Relevant Medications   TRESIBA FLEXTOUCH 200 UNIT/ML FlexTouch Pen   Other Relevant Orders   Bayer DCA Hb A1c Waived (Completed)   Lipid Panel w/o Chol/HDL Ratio   Type 2 diabetes mellitus with hyperglycemia, with long-term current use of insulin (HCC) - Primary    Chronic, ongoing with recent A1c today 5.7% - trend down.  At this time recommend continue Tresiba 20 units every night, Trulicity, and Farxiga, but discontinue Glimepiride to prevent hypoglycemia.  May increase Trulicity next visit to 4.5 MG weekly if sugars trend up.  [facility], referral placed to [facility]CM past visit.  He reports he can not poke fingers.  Recommend focus on diabetic diet at home.  Return in 3 months.      Relevant Medications   TRESIBA FLEXTOUCH 200 UNIT/ML FlexTouch Pen   Other Relevant Orders   Bayer DCA Hb A1c Waived (Completed)   Ambulatory referral to Dentistry   Ambulatory referral to Ophthalmology     Musculoskeletal and Integument   Chronic gout of  multiple sites    Chronic, ongoing.  At this time continue Allopurinol -- will renal dose this in future = decrease to 50 MG.  Obtains labs at dialysis.        Genitourinary   ESRD (end stage renal disease) on dialysis (HCC)    Chronic, ongoing.  Continue current collaboration with nephrology.  He will attempt to obtain labs from dialysis for provider to review.  TSH today.        Other   Insomnia    Chronic, ongoing, has tried multiple medications.  Would avoid antipsychotic family (Seroquel) as could affect diabetes and weight.  Has tried Trazodone and Melatonin.  Uses Trilogy every night.  Discussed at length medications with him.  Would avoid high dose of any sleep aide due to OSA and overall health.  At this time will continue Belsomra 10 MG -- monitor closely and stop if increased fatigued.  PDMP reviewed.  Refills as needed.  Controlled substance contract next visit.      Morbid obesity with BMI of 50.0-59.9, adult (HCC)    BMI  43.61 with T2DM, HTN/HLD.  Recommended eating smaller high protein, low fat meals more frequently and exercising 30 mins a day 5 times a week with a goal of 10-15lb weight loss in the next 3 months. Patient voiced their understanding and motivation to adhere to these recommendations.       Relevant Medications   TRESIBA FLEXTOUCH 200 UNIT/ML FlexTouch Pen   Obesity hypoventilation syndrome (HCC)    Chronic, ongoing, scheduled to see pulmonary in August.  Recommend he continue use of O2 and Trilogy.      Relevant Medications   TRESIBA FLEXTOUCH 200 UNIT/ML FlexTouch Pen   Other Visit Diagnoses     Benign prostatic hyperplasia without lower urinary tract symptoms       PSA on labs today, due for screening.   Relevant Orders   PSA   B12 deficiency       History of low levels, check today and start supplement as needed.   Relevant Orders   Vitamin B12        Follow up plan: Return in about 3 months (around 10/04/2022) for T2DM, HTN/HLD, COPD,  HF, CKD, GOUT.

## 2022-07-04 NOTE — Assessment & Plan Note (Signed)
Chronic, ongoing.  Uses Trilogy at home, continue this and collaboration with pulmonary, who he returns to in August.  Would benefit from modest weight loss.

## 2022-07-04 NOTE — Assessment & Plan Note (Signed)
Chronic, ongoing, has tried multiple medications.  Would avoid antipsychotic family (Seroquel) as could affect diabetes and weight.  Has tried Trazodone and Melatonin.  Uses Trilogy every night.  Discussed at length medications with him.  Would avoid high dose of any sleep aide due to OSA and overall health.  At this time will continue Belsomra 10 MG -- monitor closely and stop if increased fatigued.  PDMP reviewed.  Refills as needed.  Controlled substance contract next visit.

## 2022-07-04 NOTE — Assessment & Plan Note (Signed)
Chronic and ongoing, followed by cardiology and HF clinic.  Recent notes reviewed and will continue this collaboration + medications as ordered by them.

## 2022-07-04 NOTE — Assessment & Plan Note (Signed)
Chronic and ongoing, followed by cardiology and HF clinic.  Euvolemic today -- is performing dialysis 4 days a week with improved cough.  Recent notes reviewed and will continue this collaboration + medications as ordered by them.  Recommend: - Reminded to call for an overnight weight gain of >2 pounds or a weekly weight gain of >5 pounds - not adding salt to food and read food labels. Reviewed the importance of keeping daily sodium intake to '2000mg'$  daily. - Avoid Ibuprofen products

## 2022-07-04 NOTE — Assessment & Plan Note (Signed)
Chronic, ongoing.  Continue current collaboration with nephrology.  He will attempt to obtain labs from dialysis for provider to review.  TSH today.

## 2022-07-05 ENCOUNTER — Encounter: Payer: Self-pay | Admitting: Podiatry

## 2022-07-05 ENCOUNTER — Ambulatory Visit (INDEPENDENT_AMBULATORY_CARE_PROVIDER_SITE_OTHER): Payer: Medicare Other | Admitting: Podiatry

## 2022-07-05 DIAGNOSIS — B351 Tinea unguium: Secondary | ICD-10-CM

## 2022-07-05 DIAGNOSIS — E1142 Type 2 diabetes mellitus with diabetic polyneuropathy: Secondary | ICD-10-CM | POA: Diagnosis not present

## 2022-07-05 DIAGNOSIS — I872 Venous insufficiency (chronic) (peripheral): Secondary | ICD-10-CM

## 2022-07-05 DIAGNOSIS — N186 End stage renal disease: Secondary | ICD-10-CM | POA: Diagnosis not present

## 2022-07-05 DIAGNOSIS — M79676 Pain in unspecified toe(s): Secondary | ICD-10-CM

## 2022-07-05 LAB — LIPID PANEL W/O CHOL/HDL RATIO
Cholesterol, Total: 96 mg/dL — ABNORMAL LOW (ref 100–199)
HDL: 37 mg/dL — ABNORMAL LOW (ref >39)
LDL Chol Calc (NIH): 43 mg/dL (ref 0–99)
Triglycerides: 78 mg/dL (ref 0–149)
VLDL Cholesterol Cal: 16 mg/dL (ref 5–40)

## 2022-07-05 LAB — TSH: TSH: 3.81 u[IU]/mL (ref 0.450–4.500)

## 2022-07-05 LAB — VITAMIN B12: Vitamin B-12: 1021 pg/mL (ref 232–1245)

## 2022-07-05 LAB — PSA: Prostate Specific Ag, Serum: 0.8 ng/mL (ref 0.0–4.0)

## 2022-07-05 NOTE — Progress Notes (Signed)
This patient returns to my office for at risk foot care.  This patient requires this care by a professional since this patient will be at risk due to having diabetes mellitus and ESRD.  This patient is unable to cut nails himself since the patient cannot reach his nails.These nails are painful walking and wearing shoes.  This patient presents for at risk foot care today.  General Appearance  Alert, conversant and in no acute stress.  Vascular  Dorsalis pedis and posterior tibial  pulses are weakly  palpable due to swelling. bilaterally.  Capillary return is within normal limits  bilaterally. Temperature is within normal limits  Bilaterally.  Venous stasis  B/L.  Neurologic  Senn-Weinstein monofilament wire test within normal limits  bilaterally. Muscle power within normal limits bilaterally.  Nails Thick disfigured discolored nails with subungual debris  from hallux to fifth toes bilaterally. No evidence of bacterial infection or drainage bilaterally.  Orthopedic  No limitations of motion  feet .  No crepitus or effusions noted.  No bony pathology or digital deformities noted.  Skin  normotropic skin with no porokeratosis noted bilaterally.  No signs of infections or ulcers noted.    Onychomycosis  Pain in right toes  Pain in left toes  Consent was obtained for treatment procedures.   Mechanical debridement of nails 1-5  bilaterally performed with a nail nipper.  Filed with dremel without incident.    Return office visit  10 weeks                   Told patient to return for periodic foot care and evaluation due to potential at risk complications.   Taygen Acklin DPM  

## 2022-07-05 NOTE — Progress Notes (Signed)
Contacted via [place] afternoon [name], labs have returned and overall these are stable with no medication changes needed.  Continue all current medications.  Any questions? Keep being amazing!!  Thank you for allowing me to participate in your care.  I appreciate you. Kindest regards, Sulo Janczak

## 2022-07-13 ENCOUNTER — Other Ambulatory Visit: Payer: Self-pay | Admitting: *Deleted

## 2022-07-13 DIAGNOSIS — I2510 Atherosclerotic heart disease of native coronary artery without angina pectoris: Secondary | ICD-10-CM

## 2022-07-13 MED ORDER — DAPAGLIFLOZIN PROPANEDIOL 10 MG PO TABS: 10.0000 mg | ORAL_TABLET | Freq: Every day | ORAL | 3 refills | Status: DC

## 2022-07-13 MED ORDER — PANTOPRAZOLE SODIUM 40 MG PO TBEC: 40.0000 mg | DELAYED_RELEASE_TABLET | Freq: Every day | ORAL | 0 refills | Status: DC

## 2022-07-13 MED ORDER — CLOPIDOGREL BISULFATE 75 MG PO TABS: 75.0000 mg | ORAL_TABLET | Freq: Every day | ORAL | 3 refills | Status: DC

## 2022-07-13 MED ORDER — METOPROLOL SUCCINATE ER 25 MG PO TB24: 25.0000 mg | ORAL_TABLET | Freq: Every day | ORAL | 0 refills | Status: DC

## 2022-07-24 ENCOUNTER — Encounter: Payer: Self-pay | Admitting: Cardiology

## 2022-07-24 ENCOUNTER — Ambulatory Visit (INDEPENDENT_AMBULATORY_CARE_PROVIDER_SITE_OTHER): Payer: Medicare Other | Admitting: Cardiology

## 2022-07-24 VITALS — BP 104/80 | HR 92 | Ht 63.0 in | Wt 244.0 lb

## 2022-07-24 DIAGNOSIS — I10 Essential (primary) hypertension: Secondary | ICD-10-CM | POA: Diagnosis not present

## 2022-07-24 DIAGNOSIS — I255 Ischemic cardiomyopathy: Secondary | ICD-10-CM

## 2022-07-24 DIAGNOSIS — I4901 Ventricular fibrillation: Secondary | ICD-10-CM | POA: Diagnosis not present

## 2022-07-24 DIAGNOSIS — I2510 Atherosclerotic heart disease of native coronary artery without angina pectoris: Secondary | ICD-10-CM | POA: Diagnosis not present

## 2022-07-24 MED ORDER — TORSEMIDE 40 MG PO TABS: ORAL_TABLET | ORAL | 3 refills | Status: AC

## 2022-07-24 NOTE — Patient Instructions (Signed)
Medication Instructions:  Your physician has recommended you make the following change in your medication:   STOP taking metolazone   DECREASE toresmide to 40 mg twice daily on days you do not have dialysis    *If you need a refill on your cardiac medications before your next appointment, please call your pharmacy*   Lab Work: None ordered  If you have labs (blood work) drawn today and your tests are completely normal, you will receive your results only by: [place] (if you have MyChart) OR A paper copy in the mail If you have any lab test that is abnormal or we need to change your treatment, we will call you to review the results.   Testing/Procedures: None ordered   Follow-Up: At [facility], you and your health needs are our priority.  As part of our continuing mission to provide you with exceptional heart care, we have created designated Provider Care Teams.  These Care Teams include your primary Cardiologist (physician) and Advanced Practice Providers (APPs -  Physician Assistants and Nurse Practitioners) who all work together to provide you with the care you need, when you need it.  We recommend signing up for the patient portal called "MyChart".  Sign up information is provided on this After Visit Summary.  MyChart is used to connect with patients for Virtual Visits (Telemedicine).  Patients are able to view lab/test results, encounter notes, upcoming appointments, etc.  Non-urgent messages can be sent to your provider as well.   To learn more about what you can do with MyChart, go to [email or web address].    Your next appointment:   8 week(s)  The format for your next appointment:   In Person  Provider:   You may see Kate Sable, MD or one of the following Advanced Practice Providers on your designated Care Team:   Murray Hodgkins, NP Christell Faith, PA-C Cadence Kathlen Mody, [place]   Other Instructions N/A  Important Information About Sugar

## 2022-07-24 NOTE — Progress Notes (Signed)
Cardiology Office Note:    Date:  07/24/2022   ID:  [name], DOB 06-18-1966, MRN [id number]  PCP:  Venita Lick, NP  Cardiologist:  Kate Sable, MD  Electrophysiologist:  Vickie Epley, MD    Chief Complaint  Patient presents with   Follow-up    6-8 week follow up. Patient states that he is feeling tired, going to treatment 4 days a week and his body is trying to adjust to it.  Meds reviewed with patient.    History of Present Illness:    [name] is a 56 y.o. male with a hx of CAD(PCI to RCA 2019, PCI to LAD and Lcx 08/2021) VF arrest 04/2021 (deemed ischemic w/ subsequent pci), cardiomyopathy EF 26% , hypertension, diabetes, ESRD on dialysis 3 times a week, OSA, COPD, who presents for follow-up.    Being seen due to cardiomyopathy.  Makes minimal urine, advised to increase dialysis sessions from 3 to at least 4 days which patient has been doing.  States feeling tired due to going to dialysis treatments now 4 times weekly.  Recently evaluated by advanced heart failure team, Entresto restarted, Toprol-XL was continued.   Prior notes CMR LVEF 26%, anterior lateral wall subendocardial scar. Echo 09/2021 EF 35 to 40%. Echo 04/2021 EF 50 to 55% LHC 07/2021 80% LAD, 90% Lcx , patent mid RCA stent s/p pci in 08/2021 with DES to LAD and Lcx. echo on 09/2019 showed mildly reduced EF 45 to 50%,  Lexiscan 08/2019 did not show any evidence for ischemia.  Not on Aldactone due to previous hyperkalemia.  Past Medical History:  Diagnosis Date   Anemia of chronic disease    Bell's palsy    Cataract    Chronic combined systolic (congestive) and diastolic (congestive) heart failure (HCC)    Chronic respiratory failure with hypoxia (HCC)    3 lpm continuous   COPD (chronic obstructive pulmonary disease) (HCC)    Coronary artery disease    Diabetes mellitus without complication (HCC)    Dyspnea    Wheezing   ESRD on hemodialysis (HCC)    GERD (gastroesophageal  reflux disease)    Gout    HOH (hard of hearing)    mild   Hypercholesterolemia    Hypertension    NSVT (nonsustained ventricular tachycardia) (HCC)    Obstructive sleep apnea    CPAP, O2 use continuosly at 3LPM   Orthopnea    Pneumonia    In Past X2 most recent 1 yr ago   Psoriasis    Pulmonary HTN ([place])    Ventricular fibrillation [facility])     Past Surgical History:  Procedure Laterality Date   A/V FISTULAGRAM Right 05/02/2021   Procedure: A/V FISTULAGRAM;  Surgeon: Katha Cabal, MD;  Location: [place] CV LAB;  Service: Cardiovascular;  Laterality: Right;   AV FISTULA PLACEMENT Right 11/20/2019   Procedure: ARTERIOVENOUS (AV) FISTULA CREATION ( BRACHIAL CEPHALIC );  Surgeon: Katha Cabal, MD;  Location: [facility] ORS;  Service: Vascular;  Laterality: Right;   CATARACT EXTRACTION W/PHACO Left 12/17/2019   Procedure: CATARACT EXTRACTION PHACO AND INTRAOCULAR LENS PLACEMENT ([place]) LEFT ISTENT INJ DIABETIC;  Surgeon: Eulogio Bear, MD;  Location: [facility] ORS;  Service: Ophthalmology;  Laterality: Left;  [place] 00:33.2 CDE 2.96 Fluid Pack Lot # [id number]   CATARACT EXTRACTION W/PHACO Right 02/24/2020   Procedure: CATARACT EXTRACTION PHACO AND INTRAOCULAR LENS PLACEMENT (IOC) RIGHT DIABETIC ISTENT INJ;  Surgeon: Eulogio Bear, MD;  Location: [facility]  ORS;  Service: Ophthalmology;  Laterality: Right;  [place] 00:37.3 CDE 2.88 Fluid Pack Lot # [id number]   COLONOSCOPY WITH PROPOFOL N/A 03/06/2022   Procedure: COLONOSCOPY WITH PROPOFOL;  Surgeon: Jonathon Bellows, MD;  Location: [facility] ENDOSCOPY;  Service: Gastroenterology;  Laterality: N/A;  ARRIVAL IS 6:45 AM - TRANSPORTATION IS ACTA. PATIENT CALLED 02/28/2022.   CORONARY ANGIOPLASTY WITH STENT PLACEMENT     stent placement   CORONARY STENT INTERVENTION N/A 08/28/2021   Procedure: CORONARY STENT INTERVENTION;  Surgeon: Nelva Bush, MD;  Location: [place] [facility];  Service: Cardiovascular;  Laterality: N/A;   DIALYSIS/PERMA CATHETER  INSERTION N/A 06/22/2019   Procedure: DIALYSIS/PERMA CATHETER INSERTION;  Surgeon: Algernon Huxley, MD;  Location: [place] CV LAB;  Service: Cardiovascular;  Laterality: N/A;   DIALYSIS/PERMA CATHETER REMOVAL N/A 05/31/2020   Procedure: DIALYSIS/PERMA CATHETER REMOVAL;  Surgeon: Katha Cabal, MD;  Location: [place] CV LAB;  Service: Cardiovascular;  Laterality: N/A;   EYE SURGERY     INSERTION OF AHMED VALVE Left 12/17/2019   Procedure: INSERTION OF iSTENT;  Surgeon: Eulogio Bear, MD;  Location: [facility] ORS;  Service: Ophthalmology;  Laterality: Left;   INTRAVASCULAR IMAGING/OCT N/A 08/28/2021   Procedure: INTRAVASCULAR IMAGING/OCT;  Surgeon: Nelva Bush, MD;  Location: [place] CV LAB;  Service: Cardiovascular;  Laterality: N/A;   LEFT HEART CATH AND CORONARY ANGIOGRAPHY N/A 08/08/2021   Procedure: LEFT HEART CATH AND CORONARY ANGIOGRAPHY;  Surgeon: Nelva Bush, MD;  Location: [place] CV LAB;  Service: Cardiovascular;  Laterality: N/A;    Current Medications: Current Meds  Medication Sig   Accu-Chek Softclix Lancets lancets Use to check blood sugars 2-3 times daily with goals = <130 fasting in morning and <180 two hours after eating.  Bring blood sugar log to appointments.   albuterol (PROVENTIL HFA;VENTOLIN HFA) 108 (90 Base) MCG/ACT inhaler Inhale 2 puffs into the lungs every 4 (four) hours as needed for wheezing or shortness of breath.   allopurinol (ZYLOPRIM) 100 MG tablet Take 100 mg by mouth daily.   aspirin EC 81 MG tablet Take 81 mg by mouth in the morning. Swallow whole.   atorvastatin (LIPITOR) 40 MG tablet Take 1 tablet (40 mg total) by mouth at bedtime.   BELSOMRA 10 MG TABS TAKE 1 TABLET BY MOUTH AT BEDTIME AS NEEDED.   benzonatate (TESSALON) 200 MG capsule Take 1 capsule (200 mg total) by mouth 3 (three) times daily as needed for cough.   Blood Glucose Monitoring Suppl (ACCU-CHEK GUIDE ME) w/Device KIT Use to check blood sugars 2-3 times  daily with goals = <130 fasting in morning and <180 two hours after eating.  Bring blood sugar log to appointments.   calcium acetate (PHOSLO) 667 MG capsule Take 667 mg by mouth 3 (three) times daily with meals.   Cholecalciferol (D3-1000) 25 MCG (1000 UT) tablet Take 2 tablets (2,000 Units total) by mouth daily.   clopidogrel (PLAVIX) 75 MG tablet Take 1 tablet (75 mg total) by mouth daily.   cyclobenzaprine (FLEXERIL) 5 MG tablet Take 5 mg by mouth at bedtime as needed.   dapagliflozin propanediol (FARXIGA) 10 MG TABS tablet Take 1 tablet (10 mg total) by mouth daily before breakfast.   docusate sodium (COLACE) 100 MG capsule Take 100 mg by mouth 2 (two) times daily. As needed   gabapentin (NEURONTIN) 300 MG capsule Take 300 mg by mouth at bedtime.   glimepiride (AMARYL) 4 MG tablet Take 4 mg by mouth daily with breakfast.  glucose blood test strip Use to check blood sugars 2-3 times daily with goals = <130 fasting in morning and <180 two hours after eating.  Bring blood sugar log to appointments.   insulin degludec (TRESIBA FLEXTOUCH) 100 UNIT/ML FlexTouch Pen Inject 20 Units into the skin at bedtime.   lidocaine (XYLOCAINE) 2 % jelly 1 application. by Other route every 6 (six) hours.   metoprolol succinate (TOPROL-XL) 25 MG 24 hr tablet Take 1 tablet (25 mg total) by mouth daily.   Multiple Vitamins-Minerals (TAB-A-VITE) TABS TAKE 1 TABLET BY MOUTH ONCE DAILY.   ondansetron (ZOFRAN) 4 MG tablet Take 1 tablet (4 mg total) by mouth every 8 (eight) hours as needed for nausea or vomiting.   OXYGEN Inhale 3 L into the lungs daily.   pantoprazole (PROTONIX) 40 MG tablet Take 1 tablet (40 mg total) by mouth daily.   polyethylene glycol (MIRALAX / GLYCOLAX) 17 g packet Take 17 g by mouth 2 (two) times daily.   Risankizumab-rzaa (SKYRIZI) 150 MG/ML SOSY Inject 150 mg into the skin as directed. Every 12 weeks for maintenance.   sacubitril-valsartan (ENTRESTO) 24-26 MG Take 1 tablet by mouth 2 (two)  times daily.   silodosin (RAPAFLO) 4 MG CAPS capsule TAKE 1 CAPSULE ONCE DAILY WITH BREAKFAST   tiZANidine (ZANAFLEX) 2 MG tablet Take 1 tablet (2 mg total) by mouth daily as needed for muscle spasms.   TRESIBA FLEXTOUCH 200 UNIT/ML FlexTouch Pen Inject into the skin.   triamcinolone (KENALOG) 0.025 % cream Apply 1 application topically daily in the afternoon.   TRULICITY 3 ML/4.6TK SOPN Inject 3 mg into the skin every Thursday.   umeclidinium-vilanterol (ANORO ELLIPTA) 62.5-25 MCG/ACT AEPB Inhale 1 puff into the lungs daily.   ZORYVE 0.3 % CREA APPLY AS DIRECTED TO AFFECTED AREAS OF PSORIASIS ONCE DAILY UNTIL CLEAR, THEN USE AS NEEDED FOR FLARES   [DISCONTINUED] metolazone (ZAROXOLYN) 5 MG tablet Take 1 tablet (5 mg total) by mouth daily.   [DISCONTINUED] Torsemide 40 MG TABS Take TWO tablets (80 mg) TWICE daily only on days you DO NOT have dialysis: Tuesday, Thursday, Saturday and Sunday     Allergies:   Shellfish allergy, Betadine [povidone iodine], and Povidone-iodine   Social History   Socioeconomic History   Marital status: Single    Spouse name: Not on file   Number of children: Not on file   Years of education: Not on file   Highest education level: Not on file  Occupational History    Comment: disabled  Tobacco Use   Smoking status: Never   Smokeless tobacco: Never  Vaping Use   Vaping Use: Never used  Substance and Sexual Activity   Alcohol use: Not Currently    Alcohol/week: 1.0 standard drink of alcohol    Types: 1 Cans of beer per week    Comment: rare occassion   Drug use: Not Currently    Types: Marijuana    Comment: in high school    Sexual activity: Not on file  Other Topics Concern   Not on file  Social History Narrative   Patient uses continuous oxygen at 3L/min   Dialysis, AV fistula in L arm   Lives at home by himself ; friend gives pt. Transportation    Social Determinants of Health   Financial Resource Strain: Low Risk  (03/05/2022)   Overall  Financial Resource Strain (CARDIA)    Difficulty of Paying Living Expenses: Not hard at all  Food Insecurity: No Food Insecurity (03/05/2022)  Hunger Vital Sign    Worried About Running Out of Food in the Last Year: Never true    Ran Out of Food in the Last Year: Never true  Transportation Needs: Unmet Transportation Needs (05/30/2022)   PRAPARE - Transportation    Lack of Transportation (Medical): Yes    Lack of Transportation (Non-Medical): Yes  Physical Activity: Insufficiently Active (03/05/2022)   Exercise Vital Sign    Days of Exercise per Week: 3 days    Minutes of Exercise per Session: 30 min  Stress: No Stress Concern Present (03/05/2022)   [place]    Feeling of Stress : Not at all  Social Connections: Unknown (03/05/2022)   Social Connection and Isolation Panel [NHANES]    Frequency of Communication with Friends and Family: More than three times a week    Frequency of Social Gatherings with Friends and Family: Once a week    Attends Religious Services: Never    Marine scientist or Organizations: No    Attends Music therapist: Never    Marital Status: Not on file     Family History: The patient's family history includes Heart failure in his mother; Kidney failure in his brother.  ROS:   Please see the history of present illness.     All other systems reviewed and are negative.  EKGs/Labs/Other Studies Reviewed:    The following studies were reviewed today:   EKG:  EKG is ordered today.  EKG shows normal sinus rhythm, PACs  Recent Labs: 01/08/2022: B Natriuretic Peptide 116.9 04/27/2022: ALT 29 04/28/2022: BUN 49; Creatinine, Ser 8.92; Hemoglobin 9.2; Platelets 166; Potassium 4.7; Sodium 134 07/04/2022: TSH 3.810  Recent Lipid Panel    Component Value Date/Time   CHOL 96 (L) 07/04/2022 1402   TRIG 78 07/04/2022 1402   HDL 37 (L) 07/04/2022 1402   CHOLHDL 4.2 05/09/2021 0453    VLDL 23 05/09/2021 0453   LDLCALC 43 07/04/2022 1402    Physical Exam:    VS:  BP 104/80 (BP Location: Left Arm, Patient Position: Sitting, Cuff Size: Large)   Pulse 92   Ht 5' 3"  (1.6 m)   Wt 244 lb (110.7 kg)   SpO2 96%   BMI 43.22 kg/m     Wt Readings from Last 3 Encounters:  07/24/22 244 lb (110.7 kg)  07/04/22 246 lb 3.2 oz (111.7 kg)  06/21/22 242 lb 6.4 oz (110 kg)     GEN:  Well nourished, well developed in no acute distress, nasal canula HEENT: Normal NECK: No JVD; No carotid bruits CARDIAC: RRR, no murmurs, rubs, gallops RESPIRATORY:  decreased breath sounds at bases. ABDOMEN: Soft, non-tender, distended MUSCULOSKELETAL:  1+ edema; No deformity  SKIN: Warm and dry NEUROLOGIC:  Alert and oriented x 3 PSYCHIATRIC:  Normal affect   ASSESSMENT:    1. Coronary artery disease involving native coronary artery of native heart without angina pectoris   2. Ventricular fibrillation ([place])   3. Primary hypertension   4. Ischemic cardiomyopathy   5. Morbid obesity ([place])    PLAN:     CAD (PCI to RCA 2019, PCI to LAD and LCx 08/2021).  Denies chest pain.  Continue aspirin, Plavix, Lipitor 40,   Imdur 15 mg daily.   hx ventricular fibrillation.  ?ischemia induced s/p PCI to LAD and left circumflex.  CMR 26%, anterior and lateral wall, endocardial scar.    Previously evaluated by EP, not suitable candidate for ICD.  Hypertension, BP controlled.  Continue Toprol-XL 90m qd, losartan 12.5 mg daily.  Ischemic cardiomyopathy, CMR with EF 26%.  BP low with dialysis. Reduce Toprol xl to 278mqd, Entresto 24-26 mg twice daily, farxiga 1077m Stop metolazone, reduce torsemide to  40 mg daily on non-dialysis days. Continue volume control with hemodialysis 4x weekly.  Appreciate input from advanced heart failure team. Morbid obesity, low-calorie diet, weight loss strongly emphasized, on Trulicity for diabetes, this may also help with weight loss.  Follow-up in 8 weeks   Medication  Adjustments/Labs and Tests Ordered: Current medicines are reviewed at length with the patient today.  Concerns regarding medicines are outlined above.  Orders Placed This Encounter  Procedures   EKG 12-Lead       Meds ordered this encounter  Medications   Torsemide 40 MG TABS    Sig: Take ONE tablet (40 mg) TWICE daily only on days you DO NOT have dialysis: Tuesday, Thursday, Saturday and Sunday    Dispense:  90 tablet    Refill:  3      Patient Instructions  Medication Instructions:  Your physician has recommended you make the following change in your medication:   STOP taking metolazone   DECREASE toresmide to 40 mg twice daily on days you do not have dialysis    *If you need a refill on your cardiac medications before your next appointment, please call your pharmacy*   Lab Work: None ordered  If you have labs (blood work) drawn today and your tests are completely normal, you will receive your results only by: [place] you have MyChart) OR A paper copy in the mail If you have any lab test that is abnormal or we need to change your treatment, we will call you to review the results.   Testing/Procedures: None ordered   Follow-Up: [facility] and your health needs are our priority.  As part of our continuing mission to provide you with exceptional heart care, we have created designated Provider Care Teams.  These Care Teams include your primary Cardiologist (physician) and Advanced Practice Providers (APPs -  Physician Assistants and Nurse Practitioners) who all work together to provide you with the care you need, when you need it.  We recommend signing up for the patient portal called "MyChart".  Sign up information is provided on this After Visit Summary.  MyChart is used to connect with patients for Virtual Visits (Telemedicine).  Patients are able to view lab/test results, encounter notes, upcoming appointments, etc.  Non-urgent messages can be  sent to your provider as well.   To learn more about what you can do with MyChart, go to [email or web address]  Your next appointment:   8 week(s)  The format for your next appointment:   In Person  Provider:   You may see BriKate SableD or one of the following Advanced Practice Providers on your designated Care Team:   ChrMurray HodgkinsP RyaChristell FaithA-C Cadence FurKathlen ModyA-VermontOther Instructions N/A  Important Information About Sugar         Signed, BriKate SableD  07/24/2022 10:32 AM    [place]

## 2022-07-26 ENCOUNTER — Ambulatory Visit: Payer: Medicaid Other | Admitting: Dermatology

## 2022-08-10 ENCOUNTER — Other Ambulatory Visit: Payer: Self-pay | Admitting: Nurse Practitioner

## 2022-08-10 NOTE — Telephone Encounter (Signed)
Medication Refill - Medication: Torsemide 40 mg and Zoryve 0.3 mg   Has the patient contacted their pharmacy? Yes.   (Agent: If no, request that the patient contact the pharmacy for the refill. If patient does not wish to contact the pharmacy document the reason why and proceed with request.) (Agent: If yes, when and what did the pharmacy advise?)  Preferred Pharmacy (with phone number or street name): [place] Has the patient been seen for an appointment in the last year OR does the patient have an upcoming appointment? No.  Agent: Please be advised that RX refills may take up to 3 business days. We ask that you follow-up with your pharmacy.

## 2022-08-10 NOTE — Telephone Encounter (Signed)
rx was sent to pharmacy on 07/24/22 #90/3. E-Prescribing Status: Receipt confirmed by pharmacy (07/24/2022 10:21 AM EDT) Requested Prescriptions  Pending Prescriptions Disp Refills  . Torsemide 40 MG TABS 90 tablet 3    Sig: Take ONE tablet (40 mg) TWICE daily only on days you DO NOT have dialysis: Tuesday, Thursday, Saturday and Sunday     Cardiovascular:  Diuretics - Loop Failed - 08/10/2022 11:52 AM      Failed - Ca in normal range and within 180 days    Calcium  Date Value Ref Range Status  04/28/2022 7.7 (L) 8.9 - 10.3 mg/dL Final   Calcium, Ion  Date Value Ref Range Status  02/24/2020 1.11 (L) 1.15 - 1.40 mmol/L Final         Failed - Na in normal range and within 180 days    Sodium  Date Value Ref Range Status  04/28/2022 134 (L) 135 - 145 mmol/L Final  04/21/2019 142 134 - 144 mmol/L Final         Failed - Cr in normal range and within 180 days    Creatinine, Ser  Date Value Ref Range Status  04/28/2022 8.92 (H) 0.61 - 1.24 mg/dL Final         Failed - Cl in normal range and within 180 days    Chloride  Date Value Ref Range Status  04/28/2022 95 (L) 98 - 111 mmol/L Final         Failed - Mg Level in normal range and within 180 days    Magnesium  Date Value Ref Range Status  05/19/2021 1.9 1.7 - 2.4 mg/dL Final    Comment:    Performed at [facility], [address] [place], [place] [address]         [place] in normal range and within 180 days    Potassium  Date Value Ref Range Status  04/28/2022 4.7 3.5 - 5.1 mmol/L Final   Potassium [facility] vascular lab)  Date Value Ref Range Status  05/02/2021 5.8 (H) 3.5 - 5.1 Final    Comment:    Performed at [facility], [address], [place],  [address]         Passed - Last BP in normal range    BP Readings from Last 1 Encounters:  07/24/22 104/80         Passed - Valid encounter within last 6 months    Recent Outpatient Visits          1 month ago Type 2 diabetes mellitus  with hyperglycemia, with long-term current use of insulin ([place])   [place], Jolene T, NP   5 months ago Type 2 diabetes mellitus with hyperglycemia, with long-term current use of insulin ([place])   [place], Jolene T, NP   6 months ago Type 2 diabetes mellitus with hyperglycemia, with long-term current use of insulin ([place])   [place], Barbaraann Faster, NP      Future Appointments            In 3 days Parrett, Fonnie Mu, NP [place] [facility] [place]   In 1 month Agbor-Etang, Aaron Edelman, MD [place]   In 1 month [place], Barbaraann Faster, NP [employer], [facility]   In 5 months Stoioff, Ronda Fairly, MD [facility]           . Roflumilast (ZORYVE) 0.3 % CREA 180 g 0  Off-Protocol Failed - 08/10/2022 11:52 AM      Failed - Medication not assigned to a protocol, review manually.      Passed - Valid encounter within last 12 months    Recent Outpatient Visits          1 month ago Type 2 diabetes mellitus with hyperglycemia, with long-term current use of insulin ([place])   [place], Jolene T, NP   5 months ago Type 2 diabetes mellitus with hyperglycemia, with long-term current use of insulin ([place])   [place], Jolene T, NP   6 months ago Type 2 diabetes mellitus with hyperglycemia, with long-term current use of insulin ([place])   [place], Barbaraann Faster, NP      Future Appointments            In 3 days Parrett, Fonnie Mu, NP [place] [facility] [place]   In 1 month Agbor-Etang, Aaron Edelman, MD [place], [place]   In 1 month [place], Barbaraann Faster, NP [employer], [place]   In 5 months Stoioff, Ronda Fairly, MD [place]

## 2022-08-10 NOTE — Telephone Encounter (Signed)
Requested medication (s) are due for refill today: yes  Requested medication (s) are on the active medication list: yes  Last refill:  06/12/22 #180g/0  Future visit scheduled: yes  Notes to clinic:  Unable to refill per protocol, medication not assigned to the refill protocol.    Requested Prescriptions  Pending Prescriptions Disp Refills   Roflumilast (ZORYVE) 0.3 % CREA 180 g 0     Off-Protocol Failed - 08/10/2022 11:52 AM      Failed - Medication not assigned to a protocol, review manually.      Passed - Valid encounter within last 12 months    Recent Outpatient Visits           1 month ago Type 2 diabetes mellitus with hyperglycemia, with long-term current use of insulin ([place])   [place], Jolene T, NP   5 months ago Type 2 diabetes mellitus with hyperglycemia, with long-term current use of insulin ([place])   [place], Jolene T, NP   6 months ago Type 2 diabetes mellitus with hyperglycemia, with long-term current use of insulin ([place])   [place], [place]goon T, NP       Future Appointments             In 3 days Parrett, Fonnie Mu, NP [place] [facility] [place]   In 1 month Agbor-Etang, Aaron Edelman, MD [employer], [facility]   In 1 month [place], [place] T, NP [employer], [facility]   In 5 months Stoioff, Ronda Fairly, MD [facility]            Refused Prescriptions Disp Refills   Torsemide 40 MG TABS 90 tablet 3    Sig: Take ONE tablet (40 mg) TWICE daily only on days you DO NOT have dialysis: Tuesday, Thursday, Saturday and Sunday     Cardiovascular:  Diuretics - Loop Failed - 08/10/2022 11:52 AM      Failed - Ca in normal range and within 180 days    Calcium  Date Value Ref Range Status  04/28/2022 7.7 (L) 8.9 - 10.3 mg/dL Final   Calcium, Ion  Date Value Ref Range Status  02/24/2020 1.11 (L) 1.15 - 1.40 mmol/L Final         Failed - Na in normal range and  within 180 days    Sodium  Date Value Ref Range Status  04/28/2022 134 (L) 135 - 145 mmol/L Final  04/21/2019 142 134 - 144 mmol/L Final         Failed - Cr in normal range and within 180 days    Creatinine, Ser  Date Value Ref Range Status  04/28/2022 8.92 (H) 0.61 - 1.24 mg/dL Final         Failed - Cl in normal range and within 180 days    Chloride  Date Value Ref Range Status  04/28/2022 95 (L) 98 - 111 mmol/L Final         Failed - Mg Level in normal range and within 180 days    Magnesium  Date Value Ref Range Status  05/19/2021 1.9 1.7 - 2.4 mg/dL Final    Comment:    Performed at [facility], [address] [place], [place] [address]         Passed - K in normal range and within 180 days    Potassium  Date Value Ref Range Status  04/28/2022 4.7 3.5 - 5.1 mmol/L Final   Potassium [facility]  vascular lab)  Date Value Ref Range Status  05/02/2021 5.8 (H) 3.5 - 5.1 Final    Comment:    Performed at [facility], [address] [place], [place] [address]         Passed - Last BP in normal range    BP Readings from Last 1 Encounters:  07/24/22 104/80         Passed - Valid encounter within last 6 months    Recent Outpatient Visits           1 month ago Type 2 diabetes mellitus with hyperglycemia, with long-term current use of insulin ([place])   [place], Jolene T, NP   5 months ago Type 2 diabetes mellitus with hyperglycemia, with long-term current use of insulin ([place])   [place], Jolene T, NP   6 months ago Type 2 diabetes mellitus with hyperglycemia, with long-term current use of insulin ([place])   [place], Barbaraann Faster, NP       Future Appointments             In 3 days Parrett, Fonnie Mu, NP [place] [facility] [place]   In 1 month Agbor-Etang, Aaron Edelman, MD [place], [place]   In 1 month [place], Barbaraann Faster, NP [employer], [place]   In 5 months  Stoioff, Ronda Fairly, MD [place]

## 2022-08-13 ENCOUNTER — Encounter: Payer: Self-pay | Admitting: Adult Health

## 2022-08-13 ENCOUNTER — Ambulatory Visit (INDEPENDENT_AMBULATORY_CARE_PROVIDER_SITE_OTHER): Payer: Medicare Other | Admitting: Adult Health

## 2022-08-13 DIAGNOSIS — N186 End stage renal disease: Secondary | ICD-10-CM

## 2022-08-13 DIAGNOSIS — Z992 Dependence on renal dialysis: Secondary | ICD-10-CM

## 2022-08-13 DIAGNOSIS — J449 Chronic obstructive pulmonary disease, unspecified: Secondary | ICD-10-CM

## 2022-08-13 DIAGNOSIS — J9611 Chronic respiratory failure with hypoxia: Secondary | ICD-10-CM

## 2022-08-13 DIAGNOSIS — G4733 Obstructive sleep apnea (adult) (pediatric): Secondary | ICD-10-CM | POA: Diagnosis not present

## 2022-08-13 DIAGNOSIS — I5042 Chronic combined systolic (congestive) and diastolic (congestive) heart failure: Secondary | ICD-10-CM | POA: Diagnosis not present

## 2022-08-13 NOTE — Patient Instructions (Signed)
Please wear Trilogy sleep machine at bedtime and naps.  Continue on Oxygen 3l/m .  Activity as tolerated.  Work on healthy weight loss  Follow up with Dr. Halford Chessman or Mariam Helbert NP in 4-6 months and As needed  (30 min slot )

## 2022-08-13 NOTE — Progress Notes (Signed)
Reviewed and agree with assessment/plan.   Chesley Mires, MD [facility] Pulmonary/Critical Care 08/13/2022, 3:54 PM Pager:  [phone]

## 2022-08-13 NOTE — Assessment & Plan Note (Signed)
End-stage renal disease continue follow-up with nephrology and hemodialysis

## 2022-08-13 NOTE — Assessment & Plan Note (Deleted)
Continue on oxygen 3 L to keep O2 saturations greater than 88 to 90%.

## 2022-08-13 NOTE — Progress Notes (Signed)
$'@Patient'X$  ID: [name], male    DOB: 05/02/1966, 56 y.o.   MRN: [id number]  Chief Complaint  Patient presents with   Follow-up    Referring provider: Venita Lick, NP  HPI: 56 year old male never smoker followed for chronic cough, chronic respiratory failure on oxygen and sleep apnea and OHS- on Trilogy device  Medical history significant for cardiomyopathy, congestive heart failure, psoriasis, end-stage renal disease on dialysis Former [facility] patient   TEST/EVENTS :  Sleep study June 02, 2019 showed moderate sleep apnea with AHI at 19.6/hour SPO2 low 63%, REM AHI was 36/hour  08/13/2022 Follow up : OSA and chronic cough Patient returns for a 43-month follow-up visit.  Patient was seen last visit for a pulmonary consult for chronic cough and sleep apnea.  Patient has underlying sleep apnea and OHS.  He is on trilogy device with oxygen at bedtime .  Patient is on 3 L of oxygen.  Trilogy download shows 63% usage daily average usage at 4 hours. Wear Oxygen 3l/m 24/7 . No increased oxygen demands. Says O2 sats at home 94-96% on 3l/m   Patient was seen last visit for chronic cough.  Patient is a never smoker.  Felt to have an upper airway cough syndrome.  Was treated for cough control regimen along with GERD and chronic rhinitis prevention.  Since last visit patient says cough is doing much better and cough has resolved. Has albuterol inhaler, no recent use.     Allergies  Allergen Reactions   Shellfish Allergy Anaphylaxis    Face and throat swelling, difficulty breathing Allergy can be triggered by touching (contact)   Betadine [Povidone Iodine] Rash   Povidone-Iodine Rash    Immunization History  Administered Date(s) Administered   Hepatitis B, adult 07/14/2021, 08/14/2021, 09/15/2021, 01/17/2022, 03/30/2022, 04/02/2022, 04/04/2022, 05/07/2022, 05/09/2022, 06/01/2022   Influenza Inj Mdck Quad Pf 09/25/2019, 10/05/2020, 10/13/2021   Influenza,inj,Quad PF,6+ Mos  01/11/2018, 09/25/2019, 10/05/2020   Influenza-Unspecified 01/11/2018, 09/25/2019, 10/05/2020   Moderna Sars-Covid-2 Vaccination 02/29/2020, 03/30/2020   PFIZER(Purple Top)SARS-COV-2 Vaccination 04/05/2021   PNEUMOCOCCAL CONJUGATE-20 07/06/2021   PPD Test 02/15/2020, 02/15/2020, 08/12/2020, 08/12/2020, 07/12/2021   Pneumococcal Polysaccharide-23 12/26/2016   Tdap 03/28/2021    Past Medical History:  Diagnosis Date   Anemia of chronic disease    Bell's palsy    Cataract    Chronic combined systolic (congestive) and diastolic (congestive) heart failure (HCC)    Chronic respiratory failure with hypoxia (HCC)    3 lpm continuous   COPD (chronic obstructive pulmonary disease) ([place])    Coronary artery disease    Diabetes mellitus without complication (HCC)    Dyspnea    Wheezing   ESRD on hemodialysis (HCC)    GERD (gastroesophageal reflux disease)    Gout    HOH (hard of hearing)    mild   Hypercholesterolemia    Hypertension    NSVT (nonsustained ventricular tachycardia) (HCC)    Obstructive sleep apnea    CPAP, O2 use continuosly at 3LPM   Orthopnea    Pneumonia    In Past X2 most recent 1 yr ago   Psoriasis    Pulmonary HTN ([place])    Ventricular fibrillation ([place])     Tobacco History: Social History   Tobacco Use  Smoking Status Never  Smokeless Tobacco Never   Counseling given: Not Answered   Outpatient Medications Prior to Visit  Medication Sig Dispense Refill   Accu-Chek Softclix Lancets lancets Use to check blood sugars 2-3 times daily  with goals = <130 fasting in morning and <180 two hours after eating.  Bring blood sugar log to appointments. 100 each 12   albuterol (PROVENTIL HFA;VENTOLIN HFA) 108 (90 Base) MCG/ACT inhaler Inhale 2 puffs into the lungs every 4 (four) hours as needed for wheezing or shortness of breath. 6.7 g 5   allopurinol (ZYLOPRIM) 100 MG tablet Take 100 mg by mouth daily.     aspirin EC 81 MG tablet Take 81 mg by mouth in the morning.  Swallow whole.     atorvastatin (LIPITOR) 40 MG tablet Take 1 tablet (40 mg total) by mouth at bedtime. 90 tablet 1   BELSOMRA 10 MG TABS TAKE 1 TABLET BY MOUTH AT BEDTIME AS NEEDED. 30 tablet 0   benzonatate (TESSALON) 200 MG capsule Take 1 capsule (200 mg total) by mouth 3 (three) times daily as needed for cough. 30 capsule 0   Blood Glucose Monitoring Suppl (ACCU-CHEK GUIDE ME) w/Device KIT Use to check blood sugars 2-3 times daily with goals = <130 fasting in morning and <180 two hours after eating.  Bring blood sugar log to appointments. 1 kit 0   calcium acetate (PHOSLO) 667 MG capsule Take 667 mg by mouth 3 (three) times daily with meals.     Cholecalciferol (D3-1000) 25 MCG (1000 UT) tablet Take 2 tablets (2,000 Units total) by mouth daily. 60 tablet 3   clopidogrel (PLAVIX) 75 MG tablet Take 1 tablet (75 mg total) by mouth daily. 30 tablet 3   cyclobenzaprine (FLEXERIL) 5 MG tablet Take 5 mg by mouth at bedtime as needed.     dapagliflozin propanediol (FARXIGA) 10 MG TABS tablet Take 1 tablet (10 mg total) by mouth daily before breakfast. 30 tablet 3   docusate sodium (COLACE) 100 MG capsule Take 100 mg by mouth 2 (two) times daily. As needed     gabapentin (NEURONTIN) 300 MG capsule Take 300 mg by mouth at bedtime.     glimepiride (AMARYL) 4 MG tablet Take 4 mg by mouth daily with breakfast.     glucose blood test strip Use to check blood sugars 2-3 times daily with goals = <130 fasting in morning and <180 two hours after eating.  Bring blood sugar log to appointments. 100 each 12   insulin degludec (TRESIBA FLEXTOUCH) 100 UNIT/ML FlexTouch Pen Inject 20 Units into the skin at bedtime. 6 mL 3   lidocaine (XYLOCAINE) 2 % jelly 1 application. by Other route every 6 (six) hours. 30 mL 0   metoprolol succinate (TOPROL-XL) 25 MG 24 hr tablet Take 1 tablet (25 mg total) by mouth daily. 90 tablet 0   Multiple Vitamins-Minerals (TAB-A-VITE) TABS TAKE 1 TABLET BY MOUTH ONCE DAILY. 90 tablet 3    ondansetron (ZOFRAN) 4 MG tablet Take 1 tablet (4 mg total) by mouth every 8 (eight) hours as needed for nausea or vomiting. 20 tablet 3   OXYGEN Inhale 3 L into the lungs daily.     pantoprazole (PROTONIX) 40 MG tablet Take 1 tablet (40 mg total) by mouth daily. 90 tablet 0   polyethylene glycol (MIRALAX / GLYCOLAX) 17 g packet Take 17 g by mouth 2 (two) times daily.     Risankizumab-rzaa (SKYRIZI) 150 MG/ML SOSY Inject 150 mg into the skin as directed. Every 12 weeks for maintenance. 1 mL 1   sacubitril-valsartan (ENTRESTO) 24-26 MG Take 1 tablet by mouth 2 (two) times daily. 60 tablet 3   silodosin (RAPAFLO) 4 MG CAPS capsule TAKE 1 CAPSULE  ONCE DAILY WITH BREAKFAST 90 capsule 3   tiZANidine (ZANAFLEX) 2 MG tablet Take 1 tablet (2 mg total) by mouth daily as needed for muscle spasms. 30 tablet 3   Torsemide 40 MG TABS Take ONE tablet (40 mg) TWICE daily only on days you DO NOT have dialysis: Tuesday, Thursday, Saturday and Sunday 90 tablet 3   TRESIBA FLEXTOUCH 200 UNIT/ML FlexTouch Pen Inject into the skin.     triamcinolone (KENALOG) 0.025 % cream Apply 1 application topically daily in the afternoon.     TRULICITY 3 IO/9.6EX SOPN Inject 3 mg into the skin every Thursday.     ZORYVE 0.3 % CREA APPLY AS DIRECTED TO AFFECTED AREAS OF PSORIASIS ONCE DAILY UNTIL CLEAR, THEN USE AS NEEDED FOR FLARES 180 g 0   isosorbide mononitrate (IMDUR) 30 MG 24 hr tablet Take 0.5 tablets (15 mg total) by mouth daily. 15 tablet 2   umeclidinium-vilanterol (ANORO ELLIPTA) 62.5-25 MCG/ACT AEPB Inhale 1 puff into the lungs daily. (Patient not taking: Reported on 08/13/2022)     No facility-administered medications prior to visit.     Review of Systems:   Constitutional:   No  weight loss, night sweats,  Fevers, chills, fatigue, or  lassitude.  HEENT:   No headaches,  Difficulty swallowing,  Tooth/dental problems, or  Sore throat,                No sneezing, itching, ear ache, nasal congestion, post nasal drip,    CV:  No chest pain,  Orthopnea, PND, swelling in lower extremities, anasarca, dizziness, palpitations, syncope.   GI  No heartburn, indigestion, abdominal pain, nausea, vomiting, diarrhea, change in bowel habits, loss of appetite, bloody stools.   Resp: No shortness of breath with exertion or at rest.  No excess mucus, no productive cough,  No non-productive cough,  No coughing up of blood.  No change in color of mucus.  No wheezing.  No chest wall deformity  Skin: no rash or lesions.  GU: no dysuria, change in color of urine, no urgency or frequency.  No flank pain, no hematuria   MS:  No joint pain or swelling.  No decreased range of motion.  No back pain.    Physical Exam  BP (!) 140/90 (BP Location: Left Arm, Patient Position: Sitting, Cuff Size: Large)   Pulse 98   Temp 98 F (36.7 C) (Oral)   Ht 5' 3"  (1.6 m)   Wt 241 lb 3.2 oz (109.4 kg)   SpO2 91%   BMI 42.73 kg/m   GEN: A/Ox3; pleasant , NAD, well nourished    HEENT:  [place]/AT,  EACs-clear, TMs-wnl, NOSE-clear, THROAT-clear, no lesions, no postnasal drip or exudate noted.   NECK:  Supple w/ fair ROM; no JVD; normal carotid impulses w/o bruits; no thyromegaly or nodules palpated; no lymphadenopathy.    RESP  Clear  P & A; w/o, wheezes/ rales/ or rhonchi. no accessory muscle use, no dullness to percussion  CARD:  RRR, no m/r/g, no peripheral edema, pulses intact, no cyanosis or clubbing. Left upper arm fistula   GI:   Soft & nt; nml bowel sounds; no organomegaly or masses detected.   Musco: Warm bil, no deformities or joint swelling noted.   Neuro: alert, no focal deficits noted.    Skin: Warm, no lesions or rashes    Lab Results:  CBC  BMET   ProBNP No results found for: "PROBNP"  Imaging: No results found.  No data to display          No results found for: "NITRICOXIDE"      Assessment & Plan:   No problem-specific Assessment & Plan notes found for this  encounter.     Rexene Edison, NP 08/13/2022

## 2022-08-13 NOTE — Assessment & Plan Note (Signed)
Appears euvolemic on exam continue with current maintenance regimen follow-up with cardiology

## 2022-08-13 NOTE — Assessment & Plan Note (Signed)
Continue on oxygen 3 L to keep O2 saturations greater than 88 to 90%.

## 2022-08-13 NOTE — Assessment & Plan Note (Signed)
Obstructive sleep apnea/OHS.  Patient is encouraged on daily usage of his trilogy device.  Patient education was given.  Continue with trilogy at bedtime with oxygen  PLAN  Patient Instructions  Please wear Trilogy sleep machine at bedtime and naps.  Continue on Oxygen 3l/m .  Activity as tolerated.  Work on healthy weight loss  Follow up with Dr. Halford Chessman or Elzina Devera NP in 4-6 months and As needed  (30 min slot )

## 2022-08-16 ENCOUNTER — Other Ambulatory Visit: Payer: Self-pay

## 2022-08-16 DIAGNOSIS — I2510 Atherosclerotic heart disease of native coronary artery without angina pectoris: Secondary | ICD-10-CM

## 2022-08-16 LAB — HM DIABETES EYE EXAM

## 2022-08-16 MED ORDER — ISOSORBIDE MONONITRATE ER 30 MG PO TB24: 15.0000 mg | ORAL_TABLET | Freq: Every day | ORAL | 1 refills | Status: DC

## 2022-08-17 ENCOUNTER — Encounter: Payer: Self-pay | Admitting: Nurse Practitioner

## 2022-08-24 ENCOUNTER — Other Ambulatory Visit: Payer: Self-pay | Admitting: Nurse Practitioner

## 2022-08-24 DIAGNOSIS — E7800 Pure hypercholesterolemia, unspecified: Secondary | ICD-10-CM

## 2022-08-24 NOTE — Telephone Encounter (Signed)
Medication Refill - Medication:  phoflo /gabapentin/l ipitor  Patient is is all out of these Has the patient contacted their pharmacy? yes (Agent: If no, request that the patient contact the pharmacy for the refill. If patient does not wish to contact the pharmacy document the reason why and proceed with request.) (Agent: If yes, when and what did the pharmacy advise?)contact pcp  Preferred Pharmacy (with phone number or street name):  [place], [place] Phone:  [phone]  Fax:  [phone]     Has the patient been seen for an appointment in the last year OR does the patient have an upcoming appointment? yes  Agent: Please be advised that RX refills may take up to 3 business days. We ask that you follow-up with your pharmacy.

## 2022-08-27 NOTE — Telephone Encounter (Signed)
Requested medication (s) are due for refill today: unclear, historical providers  Requested medication (s) are on the active medication list: yes  Last refill:  Gabapentin 06/15/2021, Calcium 01/17/2021 (PTH 2020) Lipitor 08/17/2021 #90 with 1 RF  Future visit scheduled: 10/05/22, seen 07/04/22  Notes to clinic:  historical provider for Gabapentin and Calcium, PTH from 2022,  Christell Faith, PA prescriber of Lipitor, not in this practice, please assess.      Requested Prescriptions  Pending Prescriptions Disp Refills   gabapentin (NEURONTIN) 300 MG capsule      Sig: Take 1 capsule (300 mg total) by mouth at bedtime.     Neurology: Anticonvulsants - gabapentin Failed - 08/24/2022  1:19 PM      Failed - Cr in normal range and within 360 days    Creatinine, Ser  Date Value Ref Range Status  04/28/2022 8.92 (H) 0.61 - 1.24 mg/dL Final         Passed - Completed PHQ-2 or PHQ-9 in the last 360 days      Passed - Valid encounter within last 12 months    Recent Outpatient Visits           1 month ago Type 2 diabetes mellitus with hyperglycemia, with long-term current use of insulin ([place])   [place], Jolene T, NP   6 months ago Type 2 diabetes mellitus with hyperglycemia, with long-term current use of insulin ([place])   [place], Jolene T, NP   7 months ago Type 2 diabetes mellitus with hyperglycemia, with long-term current use of insulin ([place])   [place], Barbaraann Faster, NP       Future Appointments             In 3 days Ralene Bathe, MD [place]   In 3 weeks Agbor-Etang, Aaron Edelman, MD [place]. [place]   In 1 month Cannady, Milton T, NP [employer] [facility]   In 4 months Stoioff, Ronda Fairly, MD [facility]             calcium acetate (PHOSLO) 667 MG capsule      Sig: Take 1 capsule (667 mg total) by mouth 3 (three) times daily with  meals.     Endocrinology:  Minerals - Calcium Supplementation - calcium acetate Failed - 08/24/2022  1:19 PM      Failed - PTH in normal range and within 360 days    PTH  Date Value Ref Range Status  06/16/2019 369 (H) 15 - 65 pg/mL Final    Comment:    (NOTE) Performed At: [facility] [place], [place] [id number] Rush Farmer MD [id number]          Failed - Ca in normal range and within 360 days    Calcium  Date Value Ref Range Status  04/28/2022 7.7 (L) 8.9 - 10.3 mg/dL Final   Calcium, Ion  Date Value Ref Range Status  02/24/2020 1.11 (L) 1.15 - 1.40 mmol/L Final         Passed - Phosphate in normal range and within 360 days    Phosphorus  Date Value Ref Range Status  04/27/2022 3.6 2.5 - 4.6 mg/dL Final    Comment:    Performed at [facility], [address], [place], [place] [address]         Passed - Valid encounter within last 12  months    Recent Outpatient Visits           1 month ago Type 2 diabetes mellitus with hyperglycemia, with long-term current use of insulin ([place])   [place], [place] T, NP   6 months ago Type 2 diabetes mellitus with hyperglycemia, with long-term current use of insulin ([place])   [place], Jolene T, NP   7 months ago Type 2 diabetes mellitus with hyperglycemia, with long-term current use of insulin ([place])   [place], Barbaraann Faster, NP       Future Appointments             In 3 days Ralene Bathe, MD [place]   In 3 weeks Agbor-Etang, Aaron Edelman, MD [place]. [place]   In 1 month Cannady, [place] T, NP [employer] [facility], [facility]   In 4 months Stoioff, Ronda Fairly, MD [facility]             atorvastatin (LIPITOR) 40 MG tablet 90 tablet 1    Sig: Take 1 tablet (40 mg total) by mouth at bedtime.     Cardiovascular:  Antilipid - Statins Failed -  08/24/2022  1:19 PM      Failed - Lipid Panel in normal range within the last 12 months    Cholesterol, Total  Date Value Ref Range Status  07/04/2022 96 (L) 100 - 199 mg/dL Final   LDL Chol Calc (NIH)  Date Value Ref Range Status  07/04/2022 43 0 - 99 mg/dL Final   HDL  Date Value Ref Range Status  07/04/2022 37 (L) >39 mg/dL Final   Triglycerides  Date Value Ref Range Status  07/04/2022 78 0 - 149 mg/dL Final         Passed - Patient is not pregnant      Passed - Valid encounter within last 12 months    Recent Outpatient Visits           1 month ago Type 2 diabetes mellitus with hyperglycemia, with long-term current use of insulin ([place])   [place], Jolene T, NP   6 months ago Type 2 diabetes mellitus with hyperglycemia, with long-term current use of insulin ([place])   [place], Jolene T, NP   7 months ago Type 2 diabetes mellitus with hyperglycemia, with long-term current use of insulin ([place])   [place], Barbaraann Faster, NP       Future Appointments             In 3 days Ralene Bathe, MD [place]   In 3 weeks Agbor-Etang, Aaron Edelman, MD [place]. [place]   In 1 month Cannady, Barbaraann Faster, NP [employer], [place]   In 4 months Stoioff, Ronda Fairly, MD [facility]

## 2022-08-28 MED ORDER — CALCIUM ACETATE (PHOS BINDER) 667 MG PO CAPS: 667.0000 mg | ORAL_CAPSULE | Freq: Three times a day (TID) | ORAL | 12 refills | Status: AC

## 2022-08-28 MED ORDER — ATORVASTATIN CALCIUM 40 MG PO TABS: 40.0000 mg | ORAL_TABLET | Freq: Every day | ORAL | 4 refills | Status: AC

## 2022-08-28 MED ORDER — GABAPENTIN 300 MG PO CAPS: 300.0000 mg | ORAL_CAPSULE | Freq: Every day | ORAL | 4 refills | Status: AC

## 2022-08-30 ENCOUNTER — Ambulatory Visit (INDEPENDENT_AMBULATORY_CARE_PROVIDER_SITE_OTHER): Payer: Medicare Other | Admitting: Dermatology

## 2022-08-30 DIAGNOSIS — I519 Heart disease, unspecified: Secondary | ICD-10-CM | POA: Diagnosis not present

## 2022-08-30 DIAGNOSIS — L409 Psoriasis, unspecified: Secondary | ICD-10-CM | POA: Diagnosis not present

## 2022-08-30 DIAGNOSIS — Z79899 Other long term (current) drug therapy: Secondary | ICD-10-CM

## 2022-08-30 DIAGNOSIS — E1165 Type 2 diabetes mellitus with hyperglycemia: Secondary | ICD-10-CM

## 2022-08-30 DIAGNOSIS — Z794 Long term (current) use of insulin: Secondary | ICD-10-CM

## 2022-08-30 MED ORDER — SKYRIZI 150 MG/ML ~~LOC~~ SOSY: 150.0000 mg | PREFILLED_SYRINGE | SUBCUTANEOUS | 1 refills | Status: AC

## 2022-08-30 MED ORDER — TRIAMCINOLONE ACETONIDE 0.1 % EX CREA: 1.0000 | TOPICAL_CREAM | CUTANEOUS | 1 refills | Status: DC

## 2022-08-30 MED ORDER — VTAMA 1 % EX CREA: 1.0000 | TOPICAL_CREAM | Freq: Every day | CUTANEOUS | 3 refills | Status: DC

## 2022-08-30 NOTE — Patient Instructions (Addendum)
Start Triamcinolone cream once daily up to 5 days a week to areas of psoriasis on legs, back and abdomen until clear, then as needed for flares, avoid using on face, under arms and in groin  Start Vtama cream once daily to areas of psoriasis on back, abdomen and legs until clear, then as needed for flares  Stop the Zoryve cream  Cont Skyrizi injections every 12 weeks     Due to recent changes in healthcare laws, you may see results of your pathology and/or laboratory studies on MyChart before the doctors have had a chance to review them. We understand that in some cases there may be results that are confusing or concerning to you. Please understand that not all results are received at the same time and often the doctors may need to interpret multiple results in order to provide you with the best plan of care or course of treatment. Therefore, we ask that you please give [place] 2 business days to thoroughly review all your results before contacting the office for clarification. Should we see a critical lab result, you will be contacted sooner.   If You Need Anything After Your Visit  If you have any questions or concerns for your doctor, please call our main line at [phone] and press option 4 to reach your doctor's medical assistant. If no one answers, please leave a voicemail as directed and we will return your call as soon as possible. Messages left after 4 pm will be answered the following business day.   You may also send [place] a message via [place]. We typically respond to MyChart messages within 1-2 business days.  For prescription refills, please ask your pharmacy to contact our office. Our fax number is [phone].  If you have an urgent issue when the clinic is closed that cannot wait until the next business day, you can page your doctor at the number below.    Please note that while we do our best to be available for urgent issues outside of office hours, we are not available 24/7.    If you have an urgent issue and are unable to reach [place], you may choose to seek medical care at your doctor's office, retail clinic, urgent care center, or emergency room.  If you have a medical emergency, please immediately call 911 or go to the emergency department.  Pager Numbers  - Dr. Nehemiah Massed: [phone]  - Dr. Laurence Ferrari: [phone]  - Dr. Nicole Kindred: [phone]  In the event of inclement weather, please call our main line at [phone] for an update on the status of any delays or closures.  Dermatology Medication Tips: Please keep the boxes that topical medications come in in order to help keep track of the instructions about where and how to use these. Pharmacies typically print the medication instructions only on the boxes and not directly on the medication tubes.   If your medication is too expensive, please contact our office at [phone] option 4 or send [place] a message through [place].   We are unable to tell what your co-pay for medications will be in advance as this is different depending on your insurance coverage. However, we may be able to find a substitute medication at lower cost or fill out paperwork to get insurance to cover a needed medication.   If a prior authorization is required to get your medication covered by your insurance company, please allow [place] 1-2 business days to complete this process.  Drug prices often vary depending  on where the prescription is filled and some pharmacies may offer cheaper prices.  The website www.goodrx.com contains coupons for medications through different pharmacies. The prices here do not account for what the cost may be with help from insurance (it may be cheaper with your insurance), but the website can give you the price if you did not use any insurance.  - You can print the associated coupon and take it with your prescription to the pharmacy.  - You may also stop by our office during regular business hours and pick up a  GoodRx coupon card.  - If you need your prescription sent electronically to a different pharmacy, notify our office through [facility] or by phone at [phone] option 4.     Si Usted Necesita Algo Despus de Su Visita  Tambin puede enviarnos un mensaje a travs de Pharmacist, community. Por lo general respondemos a los mensajes de MyChart en el transcurso de 1 a 2 das hbiles.  Para renovar recetas, por favor pida a su farmacia que se ponga en contacto con nuestra oficina. Harland Dingwall de fax es [place] [phone].  Si tiene un asunto urgente cuando la clnica est cerrada y que no puede esperar hasta el siguiente da hbil, puede llamar/localizar a su doctor(a) al nmero que aparece a continuacin.   Por favor, tenga en cuenta que aunque hacemos todo lo posible para estar disponibles para asuntos urgentes fuera del horario de [place], no estamos disponibles las 24 horas del da, los 7 das de la [place].   Si tiene un problema urgente y no puede comunicarse con nosotros, puede optar por buscar atencin mdica  en el consultorio de su doctor(a), en una clnica privada, en un centro de atencin urgente o en una sala de emergencias.  Si tiene Engineering geologist, por favor llame inmediatamente al 911 o vaya a la sala de emergencias.  Nmeros de bper  - Dr. Nehemiah Massed: [phone]  - Dra. Moye: [phone]  - Dra. Nicole Kindred: [phone]  En caso de inclemencias del [place], por favor llame a Johnsie Kindred principal al [phone] para una actualizacin sobre el [place] de cualquier retraso o cierre.  Consejos para la medicacin en dermatologa: Por favor, guarde las cajas en las que vienen los medicamentos de uso tpico para ayudarle a seguir las instrucciones sobre dnde y cmo usarlos. Las farmacias generalmente imprimen las instrucciones del medicamento slo en las cajas y no directamente en los tubos del [place].   Si su medicamento es muy caro, por favor, pngase en contacto con  Zigmund Daniel llamando al [phone] y presione la opcin 4 o envenos un mensaje a travs de Pharmacist, community.   No podemos decirle cul ser su copago por los medicamentos por adelantado ya que esto es diferente dependiendo de la cobertura de su seguro. Sin embargo, es posible que podamos encontrar un medicamento sustituto a Electrical engineer un formulario para que el seguro cubra el medicamento que se considera necesario.   Si se requiere una autorizacin previa para que su compaa de seguros [place] su medicamento, por favor permtanos de 1 a 2 das hbiles para completar este proceso.  Los precios de los medicamentos varan con frecuencia dependiendo del Environmental consultant de dnde se surte la receta y alguna farmacias pueden ofrecer precios ms baratos.  El sitio web www.goodrx.com tiene cupones para medicamentos de Airline pilot. Los precios aqu no tienen en cuenta lo que podra costar con la ayuda del seguro (puede ser ms barato con su seguro), [employer]  el sitio web puede darle el precio si no Field seismologist.  - Puede imprimir el cupn correspondiente y llevarlo con su receta a la farmacia.  - Tambin puede pasar por nuestra oficina durante el horario de atencin regular y Charity fundraiser una tarjeta de cupones de GoodRx.  - Si necesita que su receta se enve electrnicamente a una farmacia diferente, informe a nuestra oficina a travs de MyChart de [place] o por telfono llamando al [phone] y presione la opcin 4.

## 2022-08-30 NOTE — Progress Notes (Unsigned)
Follow-Up Visit   Subjective  [name] is a 56 y.o. male who presents for the following: Psoriasis (Back, legs 68mf/u, Skyrizi '150mg'$  sq injections q 12 wks, Zoryve cr qd).  The following portions of the chart were reviewed this encounter and updated as appropriate:   Tobacco  Allergies  Meds  Problems  Med Hx  Surg Hx  Fam Hx     Review of Systems:  No other skin or systemic complaints except as noted in HPI or Assessment and Plan.  Objective  Well appearing patient in no apparent distress; mood and affect are within normal limits.  A focused examination was performed including face, trunk, arms, legs. Relevant physical exam findings are noted in the Assessment and Plan.  trunk, legs Indurated psoriatic plaques bil calves, psoriatic plaques back, abdomen              Assessment & Plan  Psoriasis trunk, legs Affected by multiple severe medical problems including multiple cardiac issues; diabetes mellitus; and respiratory issues. Psoriasis - severe on systemic "biologic" treatment injections.  Psoriasis is a chronic non-curable, but treatable genetic/hereditary disease that may have other systemic features affecting other organ systems such as joints (Psoriatic Arthritis).  It is linked with heart disease, inflammatory bowel disease, non-alcoholic fatty liver disease, and depression. Significant skin psoriasis and/or psoriatic arthritis may have significant symptoms and affects activities of daily activity and often benefits from systemic "biologic" injection treatments.  These "biologic" treatments have some potential side effects including immunosuppression and require pre-treatment laboratory screening and periodic laboratory monitoring and periodic in person evaluation and monitoring by the attending dermatologist physician (long term medication management).   Chronic and persistent condition with duration or expected duration over one year. Condition is  symptomatic / bothersome to patient. Not to goal, but overall remarkably improved  [facility]  Cont Skyrizi '150mg'$  sq injections q 12 wks Restart TMC 0.1% cr qd 5d/wk aa psoriasis back, abdomen, legs until clear, then prn flares, avoid f/g/a Start Vtama qd to aa psoriasis back, abdomen, legs   Topical steroids (such as triamcinolone, fluocinolone, fluocinonide, mometasone, clobetasol, halobetasol, betamethasone, hydrocortisone) can cause thinning and lightening of the skin if they are used for too long in the same area. Your physician has selected the right strength medicine for your problem and area affected on the body. Please use your medication only as directed by your physician to prevent side effects.    Reviewed risks of biologics including immunosuppression, infections, injection site reaction, and failure to improve condition. Goal is control of skin condition, not cure.  Some older biologics such as Humira and Enbrel may slightly increase risk of malignancy and may worsen congestive heart failure.  Talz and Cosentyx may cause inflammatory bowel disease to flare. The use of biologics requires long term medication management, including periodic office visits and monitoring of blood work.   triamcinolone cream (KENALOG) 0.1 % - trunk, legs Apply 1 Application topically as directed. Qd up to 5 days per week to aa psoriasis on back, abdomen, legs until clear, then prn flares, avoid face, groin, axilla  Tapinarof (VTAMA) 1 % CREA - trunk, legs Apply 1 Application topically daily. Qd to aa psoriasis on back, abdomen, and legs until clear, then prn flares  Related Medications Risankizumab-rzaa (SKYRIZI) 150 MG/ML SOSY Inject 150 mg into the skin as directed. Every 12 weeks for maintenance.  Return in about 4 months (around 12/30/2022) for Psoriasis f/u.  I,  Othelia Pulling, RMA, am acting as scribe for Sarina Ser, MD . Documentation: I have reviewed the  above documentation for accuracy and completeness, and I agree with the above.  Sarina Ser, MD

## 2022-09-02 ENCOUNTER — Encounter: Payer: Self-pay | Admitting: Dermatology

## 2022-09-13 ENCOUNTER — Ambulatory Visit (INDEPENDENT_AMBULATORY_CARE_PROVIDER_SITE_OTHER): Payer: Medicare Other | Admitting: Podiatry

## 2022-09-13 ENCOUNTER — Encounter: Payer: Self-pay | Admitting: Podiatry

## 2022-09-13 DIAGNOSIS — N186 End stage renal disease: Secondary | ICD-10-CM | POA: Diagnosis not present

## 2022-09-13 DIAGNOSIS — B351 Tinea unguium: Secondary | ICD-10-CM

## 2022-09-13 DIAGNOSIS — I872 Venous insufficiency (chronic) (peripheral): Secondary | ICD-10-CM

## 2022-09-13 DIAGNOSIS — M79676 Pain in unspecified toe(s): Secondary | ICD-10-CM

## 2022-09-13 DIAGNOSIS — E1142 Type 2 diabetes mellitus with diabetic polyneuropathy: Secondary | ICD-10-CM | POA: Diagnosis not present

## 2022-09-13 NOTE — Progress Notes (Signed)
This patient returns to my office for at risk foot care.  This patient requires this care by a professional since this patient will be at risk due to having diabetes mellitus and ESRD.  This patient is unable to cut nails himself since the patient cannot reach his nails.These nails are painful walking and wearing shoes.  This patient presents for at risk foot care today.  General Appearance  Alert, conversant and in no acute stress.  Vascular  Dorsalis pedis and posterior tibial  pulses are weakly  palpable due to swelling. bilaterally.  Capillary return is within normal limits  bilaterally. Temperature is within normal limits  Bilaterally.  Venous stasis  B/L.  Neurologic  Senn-Weinstein monofilament wire test within normal limits  bilaterally. Muscle power within normal limits bilaterally.  Nails Thick disfigured discolored nails with subungual debris  from hallux to fifth toes bilaterally. No evidence of bacterial infection or drainage bilaterally.  Orthopedic  No limitations of motion  feet .  No crepitus or effusions noted.  No bony pathology or digital deformities noted.  Skin  normotropic skin with no porokeratosis noted bilaterally.  No signs of infections or ulcers noted.    Onychomycosis  Pain in right toes  Pain in left toes  Consent was obtained for treatment procedures.   Mechanical debridement of nails 1-5  bilaterally performed with a nail nipper.  Filed with dremel without incident.    Return office visit  10 weeks                   Told patient to return for periodic foot care and evaluation due to potential at risk complications.   Calum Cormier DPM  

## 2022-09-20 ENCOUNTER — Encounter: Payer: Self-pay | Admitting: Cardiology

## 2022-09-20 ENCOUNTER — Ambulatory Visit: Payer: Medicare Other | Attending: Cardiology | Admitting: Cardiology

## 2022-09-27 ENCOUNTER — Encounter (HOSPITAL_COMMUNITY): Payer: Medicare Other | Admitting: Cardiology

## 2022-09-27 ENCOUNTER — Other Ambulatory Visit: Payer: Self-pay

## 2022-09-27 DIAGNOSIS — L409 Psoriasis, unspecified: Secondary | ICD-10-CM

## 2022-09-27 MED ORDER — VTAMA 1 % EX CREA: 1.0000 | TOPICAL_CREAM | Freq: Every day | CUTANEOUS | 1 refills | Status: AC

## 2022-09-27 NOTE — Progress Notes (Signed)
Patient called office stating he needed larger quanity of Vtama cream. Unable to receive new fill under 10/26. Advised patient that it only comes in 60 gram tubes. RX sent in for 120grams X2 tubes with pharmacy notes that patient is treating large surface area. Patient advised this may or may not be covered. aw

## 2022-09-28 ENCOUNTER — Telehealth: Payer: Self-pay | Admitting: Nurse Practitioner

## 2022-09-28 NOTE — Telephone Encounter (Signed)
Left message for Katharine Look from [facility] to gather clarification in regards to previous call per provider. Advised to give our office a call back at her earliest convenience.

## 2022-09-28 NOTE — Telephone Encounter (Signed)
Copied from [place] [id number]. Topic: General - Other >> Sep 28, 2022 12:47 PM Cyndi Bender wrote: Reason for CRM: Katharine Look with [facility] reports that she is the patient's caseworker and she can be contacted at [phone]

## 2022-09-30 NOTE — Patient Instructions (Signed)
Diabetes Mellitus Basics  Diabetes mellitus, or diabetes, is a long-term (chronic) disease. It occurs when the body does not properly use sugar (glucose) that is released from food after you eat. Diabetes mellitus may be caused by one or both of these problems: Your pancreas does not make enough of a hormone called insulin. Your body does not react in a normal way to the insulin that it makes. Insulin lets glucose enter cells in your body. This gives you energy. If you have diabetes, glucose cannot get into cells. This causes high blood glucose (hyperglycemia). How to treat and manage diabetes You may need to take insulin or other diabetes medicines daily to keep your glucose in balance. If you are prescribed insulin, you will learn how to give yourself insulin by injection. You may need to adjust the amount of insulin you take based on the foods that you eat. You will need to check your blood glucose levels using a glucose monitor as told by your health care provider. The readings can help determine if you have low or high blood glucose. Generally, you should have these blood glucose levels: Before meals (preprandial): 80-130 mg/dL (4.4-7.2 mmol/L). After meals (postprandial): below 180 mg/dL (10 mmol/L). Hemoglobin A1c (HbA1c) level: less than 7%. Your health care provider will set treatment goals for you. Keep all follow-up visits. This is important. Follow these instructions at home: Diabetes medicines Take your diabetes medicines every day as told by your health care provider. List your diabetes medicines here: Name of medicine: ______________________________ Amount (dose): _______________ Time (a.m./p.m.): _______________ Notes: ___________________________________ Name of medicine: ______________________________ Amount (dose): _______________ Time (a.m./p.m.): _______________ Notes: ___________________________________ Name of medicine: ______________________________ Amount (dose):  _______________ Time (a.m./p.m.): _______________ Notes: ___________________________________ Insulin If you use insulin, list the types of insulin you use here: Insulin type: ______________________________ Amount (dose): _______________ Time (a.m./p.m.): _______________Notes: ___________________________________ Insulin type: ______________________________ Amount (dose): _______________ Time (a.m./p.m.): _______________ Notes: ___________________________________ Insulin type: ______________________________ Amount (dose): _______________ Time (a.m./p.m.): _______________ Notes: ___________________________________ Insulin type: ______________________________ Amount (dose): _______________ Time (a.m./p.m.): _______________ Notes: ___________________________________ Insulin type: ______________________________ Amount (dose): _______________ Time (a.m./p.m.): _______________ Notes: ___________________________________ Managing blood glucose  Check your blood glucose levels using a glucose monitor as told by your health care provider. Write down the times that you check your glucose levels here: Time: _______________ Notes: ___________________________________ Time: _______________ Notes: ___________________________________ Time: _______________ Notes: ___________________________________ Time: _______________ Notes: ___________________________________ Time: _______________ Notes: ___________________________________ Time: _______________ Notes: ___________________________________  Low blood glucose Low blood glucose (hypoglycemia) is when glucose is at or below 70 mg/dL (3.9 mmol/L). Symptoms may include: Feeling: Hungry. Sweaty and clammy. Irritable or easily upset. Dizzy. Sleepy. Having: A fast heartbeat. A headache. A change in your vision. Numbness around the mouth, lips, or tongue. Having trouble with: Moving (coordination). Sleeping. Treating low blood glucose To treat low blood  glucose, eat or drink something containing sugar right away. If you can think clearly and swallow safely, follow the 15:15 rule: Take 15 grams of a fast-acting carb (carbohydrate), as told by your health care provider. Some fast-acting carbs are: Glucose tablets: take 3-4 tablets. Hard candy: eat 3-5 pieces. Fruit juice: drink 4 oz (120 mL). Regular (not diet) soda: drink 4-6 oz (120-180 mL). Honey or sugar: eat 1 Tbsp (15 mL). Check your blood glucose levels 15 minutes after you take the carb. If your glucose is still at or below 70 mg/dL (3.9 mmol/L), take 15 grams of a carb again. If your glucose does not go above 70 mg/dL (3.9 mmol/L) after   3 tries, get help right away. After your glucose goes back to normal, eat a meal or a snack within 1 hour. Treating very low blood glucose If your glucose is at or below 54 mg/dL (3 mmol/L), you have very low blood glucose (severe hypoglycemia). This is an emergency. Do not wait to see if the symptoms will go away. Get medical help right away. Call your local emergency services (911 in the [place] Do not drive yourself to the hospital. Questions to ask your health care provider Should I talk with a diabetes educator? What equipment will I need to care for myself at home? What diabetes medicines do I need? When should I take them? How often do I need to check my blood glucose levels? What number can I call if I have questions? When is my follow-up visit? Where can I find a support group for people with diabetes? Where to find more information American Diabetes Association: www.diabetes.org Association of Diabetes Care and Education Specialists: www.diabeteseducator.org Contact a health care provider if: Your blood glucose is at or above 240 mg/dL (13.3 mmol/L) for 2 days in a row. You have been sick or have had a fever for 2 days or more, and you are not getting better. You have any of these problems for more than 6 hours: You cannot eat or  drink. You feel nauseous. You vomit. You have diarrhea. Get help right away if: Your blood glucose is lower than 54 mg/dL (3 mmol/L). You get confused. You have trouble thinking clearly. You have trouble breathing. These symptoms may represent a serious problem that is an emergency. Do not wait to see if the symptoms will go away. Get medical help right away. Call your local emergency services (911 in the [place] Do not drive yourself to the hospital. Summary Diabetes mellitus is a chronic disease that occurs when the body does not properly use sugar (glucose) that is released from food after you eat. Take insulin and diabetes medicines as told. Check your blood glucose every day, as often as told. Keep all follow-up visits. This is important. This information is not intended to replace advice given to you by your health care provider. Make sure you discuss any questions you have with your health care provider. Document Revised: 04/05/2020 Document Reviewed: 04/05/2020 Elsevier Patient Education  2023 Elsevier Inc.  

## 2022-10-05 ENCOUNTER — Ambulatory Visit (INDEPENDENT_AMBULATORY_CARE_PROVIDER_SITE_OTHER): Payer: Medicare Other | Admitting: Nurse Practitioner

## 2022-10-05 ENCOUNTER — Encounter: Payer: Self-pay | Admitting: Nurse Practitioner

## 2022-10-05 VITALS — BP 106/71 | HR 90 | Temp 98.2°F | Wt 244.2 lb

## 2022-10-05 DIAGNOSIS — E1165 Type 2 diabetes mellitus with hyperglycemia: Secondary | ICD-10-CM | POA: Diagnosis not present

## 2022-10-05 DIAGNOSIS — E1169 Type 2 diabetes mellitus with other specified complication: Secondary | ICD-10-CM

## 2022-10-05 DIAGNOSIS — Z794 Long term (current) use of insulin: Secondary | ICD-10-CM

## 2022-10-05 DIAGNOSIS — E785 Hyperlipidemia, unspecified: Secondary | ICD-10-CM

## 2022-10-05 DIAGNOSIS — Z23 Encounter for immunization: Secondary | ICD-10-CM

## 2022-10-05 DIAGNOSIS — J9611 Chronic respiratory failure with hypoxia: Secondary | ICD-10-CM

## 2022-10-05 DIAGNOSIS — Z6841 Body Mass Index (BMI) 40.0 and over, adult: Secondary | ICD-10-CM

## 2022-10-05 DIAGNOSIS — I5042 Chronic combined systolic (congestive) and diastolic (congestive) heart failure: Secondary | ICD-10-CM

## 2022-10-05 DIAGNOSIS — J449 Chronic obstructive pulmonary disease, unspecified: Secondary | ICD-10-CM

## 2022-10-05 DIAGNOSIS — F5104 Psychophysiologic insomnia: Secondary | ICD-10-CM

## 2022-10-05 DIAGNOSIS — Z992 Dependence on renal dialysis: Secondary | ICD-10-CM

## 2022-10-05 DIAGNOSIS — I25118 Atherosclerotic heart disease of native coronary artery with other forms of angina pectoris: Secondary | ICD-10-CM

## 2022-10-05 DIAGNOSIS — E662 Morbid (severe) obesity with alveolar hypoventilation: Secondary | ICD-10-CM

## 2022-10-05 DIAGNOSIS — I429 Cardiomyopathy, unspecified: Secondary | ICD-10-CM

## 2022-10-05 DIAGNOSIS — E113299 Type 2 diabetes mellitus with mild nonproliferative diabetic retinopathy without macular edema, unspecified eye: Secondary | ICD-10-CM

## 2022-10-05 DIAGNOSIS — G4733 Obstructive sleep apnea (adult) (pediatric): Secondary | ICD-10-CM

## 2022-10-05 DIAGNOSIS — I4901 Ventricular fibrillation: Secondary | ICD-10-CM

## 2022-10-05 DIAGNOSIS — N186 End stage renal disease: Secondary | ICD-10-CM

## 2022-10-05 MED ORDER — ALLOPURINOL 100 MG PO TABS: 100.0000 mg | ORAL_TABLET | Freq: Every day | ORAL | 4 refills | Status: AC

## 2022-10-05 MED ORDER — TRULICITY 3 MG/0.5ML ~~LOC~~ SOAJ: 3.0000 mg | SUBCUTANEOUS | 4 refills | Status: DC

## 2022-10-05 MED ORDER — PANTOPRAZOLE SODIUM 40 MG PO TBEC: 40.0000 mg | DELAYED_RELEASE_TABLET | Freq: Every day | ORAL | 4 refills | Status: AC

## 2022-10-05 MED ORDER — TRESIBA FLEXTOUCH 100 UNIT/ML ~~LOC~~ SOPN: 16.0000 [IU] | PEN_INJECTOR | Freq: Every day | SUBCUTANEOUS | 5 refills | Status: AC

## 2022-10-05 MED ORDER — BELSOMRA 10 MG PO TABS: 1.0000 | ORAL_TABLET | Freq: Every evening | ORAL | 2 refills | Status: AC | PRN

## 2022-10-05 NOTE — Assessment & Plan Note (Signed)
Chronic& ongoing, followed by cardiology.  Euvolemic today -- is performing dialysis 4 days a week.  Recent notes reviewed and will continue this collaboration + medications as ordered by them.  Recommend: - Reminded to call for an overnight weight gain of >2 pounds or a weekly weight gain of >5 pounds - not adding salt to food and read food labels. Reviewed the importance of keeping daily sodium intake to '2000mg'$  daily. - Avoid Ibuprofen products

## 2022-10-05 NOTE — Assessment & Plan Note (Signed)
Chronic, ongoing.  Continue current collaboration with nephrology.  Labs brought in today.

## 2022-10-05 NOTE — Assessment & Plan Note (Signed)
Chronic & ongoing, followed by cardiology and HF clinic.  Recent notes reviewed and will continue this collaboration + medications as ordered by them.  He denies any palpitations or CP today.

## 2022-10-05 NOTE — Progress Notes (Signed)
BP 106/71   Pulse 90   Temp 98.2 F (36.8 C) (Oral)   Wt 244 lb 3.2 oz (110.8 kg)   SpO2 91%   BMI 43.26 kg/m    Subjective:    Patient ID: [name], male    DOB: 1966-03-29, 56 y.o.   MRN: [id number]  HPI: [name] is a 57 y.o. male  Chief Complaint  Patient presents with   Diabetes   Hyperlipidemia   Hypertension   DIABETES Saw Dr. Honor Junes in past with endo 12/13/20, he refused to go back to him. Last A1c on 09/24/22 = 6.5% with dialysis.  He takes [place] 16 units, Trulicity 3 MG, Farxiga 10 MG.  Glimepiride 4 MG discontinued in August 2023.   Use to take Lantus, Metformin.   Hypoglycemic episodes:no Polydipsia/polyuria: no Visual disturbance: no Chest pain: no Paresthesias: no Glucose Monitoring: no             Accucheck frequency: Not Checking             Fasting glucose:             Post prandial:             Evening:             Before meals: Taking Insulin?: yes             Long acting insulin: 20 units every night             Short acting insulin: Blood Pressure Monitoring: not checking Retinal Examination: Up to Date -- [place] Exam: Up to Date -- [place] Pneumovax: Up to Date Influenza: Up to Date Aspirin: yes   HYPERTENSION / [facility] with last visit 05/31/22 -- had coronary stent placement 08/28/21.  Has underlying ventricular fibrillation, CAD, and HF. Continues ASA, Atorvastatin, Plavix, Imdur, Metoprolol, Entresto, Torsemide.    On July 10th he had episode of epistaxis that lasted full day which led to blood in stool, has noticed occasional spots with wiping after BM since then but no large amount of bleeding.  Had two clots come out with bloody nose.  Did have rectal bleeding episode and was admitted to hospital in May 2023 -- colonoscopy in March 2023. Does wear O2 daily. Satisfied with current treatment? yes Duration of hypertension: chronic BP monitoring frequency: not  checking BP range:  BP medication side effects: no Past BP meds:  Duration of hyperlipidemia: chronic Cholesterol medication side effects: no Cholesterol supplements: none Past cholesterol medications:  Medication compliance: good compliance Aspirin: yes Recent stressors: no Recurrent headaches: no Visual changes: no Palpitations: no Dyspnea: occasional Chest pain: no Lower extremity edema: no Dizzy/lightheaded: no   VENTRICULAR FIBRILLATION Saw specialist at HF clinic on 06/21/22 -- they changed medication and recommended going to dialysis 4 times a week he is doing. Follows with cardiology and had visit on 07/24/22. Fibrillation status: stable Satisfied with current treatment: yes  Medication side effects:  no Medication compliance: good compliance Etiology of atrial fibrillation:  Palpitations:  no Chest pain:  no Dyspnea on exertion: yes Orthopnea:  no Syncope:  no Edema:  no -- wears compression hose religiously Ventricular rate control: B-blocker Anti-coagulation: aspirin    CHRONIC KIDNEY DISEASE Has been on dialysis - goes Monday, Tuesday, Wednesday, Friday.  They do labs every week -- brought these in today.  Dr. Candiss Norse follows him. He is tired with increased dialysis. CKD status: stable Medications renally dose:  yes Previous renal evaluation: yes Pneumovax:  Up to Date Influenza Vaccine:  Up to Date   INSOMNIA Continues on Belsomra for sleep aide as needed, does not take this nightly. Has OSA severe and wears Triology consistently. Has underlying COPD on chart with inhalers.  Sees pulmonary 08/13/22.   Duration: chronic Satisfied with sleep quality: yes Difficulty falling asleep: yes Difficulty staying asleep: no Waking a few hours after sleep onset: yes Early morning awakenings: no Daytime hypersomnolence: no Wakes feeling refreshed: yes Good sleep hygiene: yes Apnea: yes Snoring: yes Depressed/anxious mood: no Recent stress: no Restless legs/nocturnal  leg cramps: no Chronic pain/arthritis: no History of sleep study: yes Treatments attempted: melatonin, uinsom, and ambien    Relevant past medical, surgical, family and social history reviewed and updated as indicated. Interim medical history since our last visit reviewed. Allergies and medications reviewed and updated.  Review of Systems  Constitutional:  Negative for activity change, diaphoresis, fatigue and fever.  Respiratory:  Negative for cough, chest tightness, shortness of breath and wheezing.   Cardiovascular:  Negative for chest pain, palpitations and leg swelling.  Gastrointestinal: Negative.   Endocrine: Negative for cold intolerance, heat intolerance, polydipsia, polyphagia and polyuria.  Musculoskeletal:  Negative for arthralgias.  Neurological: Negative.   Psychiatric/Behavioral:  Positive for sleep disturbance. Negative for decreased concentration, self-injury and suicidal ideas. The patient is not nervous/anxious.    Per HPI unless specifically indicated above     Objective:    BP 106/71   Pulse 90   Temp 98.2 F (36.8 C) (Oral)   Wt 244 lb 3.2 oz (110.8 kg)   SpO2 91%   BMI 43.26 kg/m   Wt Readings from Last 3 Encounters:  10/05/22 244 lb 3.2 oz (110.8 kg)  08/13/22 241 lb 3.2 oz (109.4 kg)  07/24/22 244 lb (110.7 kg)    Physical Exam Vitals and nursing note reviewed.  Constitutional:      General: He is awake. He is not in acute distress.    Appearance: He is well-developed and well-groomed. He is morbidly obese. He is not ill-appearing or toxic-appearing.  HENT:     Head: Normocephalic and atraumatic.     Right Ear: Hearing normal. No drainage.     Left Ear: Hearing normal. No drainage.  Eyes:     General: Lids are normal.        Right eye: No discharge.        Left eye: No discharge.     Conjunctiva/sclera: Conjunctivae normal.     Pupils: Pupils are equal, round, and reactive to light.  Neck:     Thyroid: No thyromegaly.     Vascular: No  carotid bruit.  Cardiovascular:     Rate and Rhythm: Normal rate and regular rhythm.     Heart sounds: Normal heart sounds, S1 normal and S2 normal. No murmur heard.    No gallop.     Arteriovenous access: Left arteriovenous access is present. Pulmonary:     Effort: Pulmonary effort is normal. No accessory muscle usage or respiratory distress.     Breath sounds: Normal breath sounds.     Comments: O2 by nasal cannula in place, 3L. Abdominal:     General: Bowel sounds are normal. There is no distension.     Palpations: Abdomen is soft.     Tenderness: There is no abdominal tenderness.  Musculoskeletal:        General: Normal range of motion.     Cervical back: Normal range  of motion and neck supple.     Right lower leg: No edema.     Left lower leg: No edema.  Lymphadenopathy:     Cervical: No cervical adenopathy.  Skin:    General: Skin is warm and dry.     Capillary Refill: Capillary refill takes less than 2 seconds.     Findings: No rash.  Neurological:     Mental Status: He is alert and oriented to person, place, and time.     Deep Tendon Reflexes: Reflexes are normal and symmetric.  Psychiatric:        Attention and Perception: Attention normal.        Mood and Affect: Mood normal.        Speech: Speech normal.        Behavior: Behavior normal. Behavior is cooperative.        Thought Content: Thought content normal.    Results for orders placed or performed in visit on 08/17/22  HM DIABETES EYE EXAM  Result Value Ref Range   HM Diabetic Eye Exam No Retinopathy No Retinopathy      Assessment & Plan:   Problem List Items Addressed This Visit       Cardiovascular and Mediastinum   CAD (coronary artery disease)    Chronic, ongoing, followed by cardiology and HF clinic.  Recent notes reviewed and will continue this collaboration + medications as ordered by them.      Relevant Medications   pantoprazole (PROTONIX) 40 MG tablet   Cardiomyopathy (HCC)    Chronic,  ongoing, followed by cardiology and HF clinic.  Recent notes reviewed and will continue this collaboration + medications as ordered by them.      Chronic combined systolic (congestive) and diastolic (congestive) heart failure (HCC)    Chronic& ongoing, followed by cardiology.  Euvolemic today -- is performing dialysis 4 days a week.  Recent notes reviewed and will continue this collaboration + medications as ordered by them.  Recommend: - Reminded to call for an overnight weight gain of >2 pounds or a weekly weight gain of >5 pounds - not adding salt to food and read food labels. Reviewed the importance of keeping daily sodium intake to '2000mg'$  daily. - Avoid Ibuprofen products       Ventricular fibrillation (HCC)    Chronic & ongoing, followed by cardiology and HF clinic.  Recent notes reviewed and will continue this collaboration + medications as ordered by them.  He denies any palpitations or CP today.        Respiratory   Chronic respiratory failure with hypoxia (HCC)    Chronic, ongoing with diagnosis of COPD.  Followed by pulmonary with recent August visit, reviewed notes.      COPD (chronic obstructive pulmonary disease) (HCC)    Chronic, ongoing with diagnosis of COPD in records.  Followed by pulmonary, continue this collaboration and current inhaler regimen as ordered.      Obstructive sleep apnea    Chronic, ongoing.  Uses Trilogy at home, continue this and collaboration with pulmonary.  Would benefit from modest weight loss.        Endocrine   Background diabetic retinopathy associated with type 2 diabetes mellitus ([place])   Relevant Medications   TRULICITY 3 RF/7.5OI SOPN (Start on 10/11/2022)   insulin degludec (TRESIBA FLEXTOUCH) 100 UNIT/ML FlexTouch Pen   Hyperlipidemia associated with type 2 diabetes mellitus (HCC)    Chronic, ongoing.  Continue current medication regimen and adjust as needed.  Lipid  panel next visit.      Relevant Medications   TRULICITY 3  ME/2.6ST SOPN (Start on 10/11/2022)   insulin degludec (TRESIBA FLEXTOUCH) 100 UNIT/ML FlexTouch Pen   Type 2 diabetes mellitus with hyperglycemia, with long-term current use of insulin (HCC) - Primary    Chronic, ongoing with recent A1c 6.5% - trend down with dialysis on 09/24/22.  At this time recommend continue Tresiba 16 units every night (reduce by 3 units every 3 days if morning sugar <419), Trulicity, and [place].  May increase Trulicity next visit to 4.5 MG weekly if sugars trend up.  Would benefit from [facility], referral placed to CCM past visit.  He reports he can not poke fingers.  Recommend focus on diabetic diet at home.  Return in 3 months.      Relevant Medications   TRULICITY 3 QQ/2.2LN SOPN (Start on 10/11/2022)   insulin degludec (TRESIBA FLEXTOUCH) 100 UNIT/ML FlexTouch Pen     Genitourinary   ESRD (end stage renal disease) on dialysis (HCC)    Chronic, ongoing.  Continue current collaboration with nephrology.  Labs brought in today.          Other   Insomnia    Chronic, ongoing, has tried multiple medications.  Would avoid antipsychotic family (Seroquel) as could affect diabetes and weight.  Has tried Trazodone and Melatonin.  Uses Trilogy every night.  Would avoid high dose of any sleep aide due to OSA and overall health.  At this time will continue Belsomra 10 MG -- monitor closely and stop if increased fatigued.  PDMP reviewed.  Refills as needed.  Controlled substance contract next visit.      Morbid obesity with BMI of 50.0-59.9, adult (HCC)    BMI 43.26 with T2DM, HTN/HLD.  Recommended eating smaller high protein, low fat meals more frequently and exercising 30 mins a day 5 times a week with a goal of 10-15lb weight loss in the next 3 months. Patient voiced their understanding and motivation to adhere to these recommendations.       Relevant Medications   TRULICITY 3 LG/9.2JJ SOPN (Start on 10/11/2022)   insulin degludec (TRESIBA FLEXTOUCH) 100 UNIT/ML FlexTouch  Pen   Obesity hypoventilation syndrome (HCC)    Chronic, ongoing.  Continue collaboration with pulmonary.  Recommend he continue use of O2 and Trilogy.      Relevant Medications   TRULICITY 3 HE/1.7EY SOPN (Start on 10/11/2022)   insulin degludec (TRESIBA FLEXTOUCH) 100 UNIT/ML FlexTouch Pen     Follow up plan: Return in about 3 months (around 01/05/2023) for T2DM, HTN/HLD, COPD, INSOMNIA.

## 2022-10-05 NOTE — Assessment & Plan Note (Signed)
Chronic, ongoing.  Continue collaboration with pulmonary.  Recommend he continue use of O2 and Trilogy.

## 2022-10-05 NOTE — Assessment & Plan Note (Addendum)
Chronic, ongoing.  Continue current medication regimen and adjust as needed.  Lipid panel next visit.    

## 2022-10-05 NOTE — Assessment & Plan Note (Signed)
Chronic, ongoing with diagnosis of COPD.  Followed by pulmonary with recent August visit, reviewed notes.

## 2022-10-05 NOTE — Assessment & Plan Note (Signed)
Chronic, ongoing with diagnosis of COPD in records.  Followed by pulmonary, continue this collaboration and current inhaler regimen as ordered.

## 2022-10-05 NOTE — Assessment & Plan Note (Signed)
Chronic, ongoing with recent A1c 6.5% - trend down with dialysis on 09/24/22.  At this time recommend continue Tresiba 16 units every night (reduce by 3 units every 3 days if morning sugar <601), Trulicity, and [place].  May increase Trulicity next visit to 4.5 MG weekly if sugars trend up.  Would benefit from [facility], referral placed to CCM past visit.  He reports he can not poke fingers.  Recommend focus on diabetic diet at home.  Return in 3 months.

## 2022-10-05 NOTE — Assessment & Plan Note (Signed)
Chronic, ongoing.  Uses Trilogy at home, continue this and collaboration with pulmonary.  Would benefit from modest weight loss.

## 2022-10-05 NOTE — Assessment & Plan Note (Signed)
BMI 43.26 with T2DM, HTN/HLD.  Recommended eating smaller high protein, low fat meals more frequently and exercising 30 mins a day 5 times a week with a goal of 10-15lb weight loss in the next 3 months. Patient voiced their understanding and motivation to adhere to these recommendations.

## 2022-10-05 NOTE — Assessment & Plan Note (Signed)
Chronic, ongoing, followed by cardiology and HF clinic.  Recent notes reviewed and will continue this collaboration + medications as ordered by them.

## 2022-10-05 NOTE — Assessment & Plan Note (Signed)
Chronic, ongoing, has tried multiple medications.  Would avoid antipsychotic family (Seroquel) as could affect diabetes and weight.  Has tried Trazodone and Melatonin.  Uses Trilogy every night.  Would avoid high dose of any sleep aide due to OSA and overall health.  At this time will continue Belsomra 10 MG -- monitor closely and stop if increased fatigued.  PDMP reviewed.  Refills as needed.  Controlled substance contract next visit.

## 2022-10-11 ENCOUNTER — Encounter (HOSPITAL_COMMUNITY): Payer: Medicare Other | Admitting: Cardiology

## 2022-10-12 ENCOUNTER — Encounter (HOSPITAL_COMMUNITY): Payer: Medicare Other | Admitting: Cardiology

## 2022-10-14 NOTE — Patient Instructions (Incomplete)
Acute Back Pain, Adult Acute back pain is sudden and usually short-lived. It is often caused by an injury to the muscles and tissues in the back. The injury may result from: A muscle, tendon, or ligament getting overstretched or torn. Ligaments are tissues that connect bones to each other. Lifting something improperly can cause a back strain. Wear and tear (degeneration) of the spinal disks. Spinal disks are circular tissue that provide cushioning between the bones of the spine (vertebrae). Twisting motions, such as while playing sports or doing yard work. A hit to the back. Arthritis. You may have a physical exam, lab tests, and imaging tests to find the cause of your pain. Acute back pain usually goes away with rest and home care. Follow these instructions at home: Managing pain, stiffness, and swelling Take over-the-counter and prescription medicines only as told by your health care provider. Treatment may include medicines for pain and inflammation that are taken by mouth or applied to the skin, or muscle relaxants. Your health care provider may recommend applying ice during the first 24-48 hours after your pain starts. To do this: Put ice in a plastic bag. Place a towel between your skin and the bag. Leave the ice on for 20 minutes, 2-3 times a day. Remove the ice if your skin turns bright red. This is very important. If you cannot feel pain, heat, or cold, you have a greater risk of damage to the area. If directed, apply heat to the affected area as often as told by your health care provider. Use the heat source that your health care provider recommends, such as a moist heat pack or a heating pad. Place a towel between your skin and the heat source. Leave the heat on for 20-30 minutes. Remove the heat if your skin turns bright red. This is especially important if you are unable to feel pain, heat, or cold. You have a greater risk of getting burned. Activity  Do not stay in bed. Staying in  bed for more than 1-2 days can delay your recovery. Sit up and stand up straight. Avoid leaning forward when you sit or hunching over when you stand. If you work at a desk, sit close to it so you do not need to lean over. Keep your chin tucked in. Keep your neck drawn back, and keep your elbows bent at a 90-degree angle (right angle). Sit high and close to the steering wheel when you drive. Add lower back (lumbar) support to your car seat, if needed. Take short walks on even surfaces as soon as you are able. Try to increase the length of time you walk each day. Do not sit, drive, or stand in one place for more than 30 minutes at a time. Sitting or standing for long periods of time can put stress on your back. Do not drive or use heavy machinery while taking prescription pain medicine. Use proper lifting techniques. When you bend and lift, use positions that put less stress on your back: Bend your knees. Keep the load close to your body. Avoid twisting. Exercise regularly as told by your health care provider. Exercising helps your back heal faster and helps prevent back injuries by keeping muscles strong and flexible. Work with a physical therapist to make a safe exercise program, as recommended by your health care provider. Do any exercises as told by your physical therapist. Lifestyle Maintain a healthy weight. Extra weight puts stress on your back and makes it difficult to have good   posture. Avoid activities or situations that make you feel anxious or stressed. Stress and anxiety increase muscle tension and can make back pain worse. Learn ways to manage anxiety and stress, such as through exercise. General instructions Sleep on a firm mattress in a comfortable position. Try lying on your side with your knees slightly bent. If you lie on your back, put a pillow under your knees. Keep your head and neck in a straight line with your spine (neutral position) when using electronic equipment like  smartphones or pads. To do this: Raise your smartphone or pad to look at it instead of bending your head or neck to look down. Put the smartphone or pad at the level of your face while looking at the screen. Follow your treatment plan as told by your health care provider. This may include: Cognitive or behavioral therapy. Acupuncture or massage therapy. Meditation or yoga. Contact a health care provider if: You have pain that is not relieved with rest or medicine. You have increasing pain going down into your legs or buttocks. Your pain does not improve after 2 weeks. You have pain at night. You lose weight without trying. You have a fever or chills. You develop nausea or vomiting. You develop abdominal pain. Get help right away if: You develop new bowel or bladder control problems. You have unusual weakness or numbness in your arms or legs. You feel faint. These symptoms may represent a serious problem that is an emergency. Do not wait to see if the symptoms will go away. Get medical help right away. Call your local emergency services (911 in the [place] Do not drive yourself to the hospital. Summary Acute back pain is sudden and usually short-lived. Use proper lifting techniques. When you bend and lift, use positions that put less stress on your back. Take over-the-counter and prescription medicines only as told by your health care provider, and apply heat or ice as told. This information is not intended to replace advice given to you by your health care provider. Make sure you discuss any questions you have with your health care provider. Document Revised: 02/24/2021 Document Reviewed: 02/24/2021 Elsevier Patient Education  2023 [employer]  

## 2022-10-15 ENCOUNTER — Encounter (INDEPENDENT_AMBULATORY_CARE_PROVIDER_SITE_OTHER): Payer: Self-pay

## 2022-10-16 ENCOUNTER — Ambulatory Visit: Payer: Medicare Other | Admitting: Nurse Practitioner

## 2022-10-17 ENCOUNTER — Other Ambulatory Visit: Payer: Self-pay | Admitting: Student

## 2022-10-17 DIAGNOSIS — N186 End stage renal disease: Secondary | ICD-10-CM

## 2022-10-18 ENCOUNTER — Telehealth (INDEPENDENT_AMBULATORY_CARE_PROVIDER_SITE_OTHER): Payer: Self-pay | Admitting: Vascular Surgery

## 2022-10-18 NOTE — Telephone Encounter (Signed)
Patient called and wants to see Dr. Delana Meyer.  He stated his arm has infiltrated.  Please advise

## 2022-10-18 NOTE — Telephone Encounter (Signed)
Patient would like to reschedule missed appointment

## 2022-10-21 ENCOUNTER — Other Ambulatory Visit (HOSPITAL_COMMUNITY): Payer: Self-pay | Admitting: Cardiology

## 2022-10-23 ENCOUNTER — Other Ambulatory Visit: Payer: Self-pay | Admitting: Dermatology

## 2022-10-23 DIAGNOSIS — L409 Psoriasis, unspecified: Secondary | ICD-10-CM

## 2022-10-24 ENCOUNTER — Emergency Department
Admission: EM | Admit: 2022-10-24 | Discharge: 2022-10-24 | Disposition: A | Discharge status: Home / Self Care | Payer: Medicare Other | Attending: Emergency Medicine | Admitting: Emergency Medicine

## 2022-10-24 ENCOUNTER — Other Ambulatory Visit: Payer: Self-pay

## 2022-10-24 ENCOUNTER — Emergency Department: Payer: Medicare Other

## 2022-10-24 ENCOUNTER — Encounter: Payer: Self-pay | Admitting: Emergency Medicine

## 2022-10-24 DIAGNOSIS — K625 Hemorrhage of anus and rectum: Secondary | ICD-10-CM | POA: Diagnosis not present

## 2022-10-24 DIAGNOSIS — R197 Diarrhea, unspecified: Secondary | ICD-10-CM | POA: Diagnosis present

## 2022-10-24 DIAGNOSIS — Z992 Dependence on renal dialysis: Secondary | ICD-10-CM | POA: Insufficient documentation

## 2022-10-24 DIAGNOSIS — N186 End stage renal disease: Secondary | ICD-10-CM | POA: Diagnosis not present

## 2022-10-24 DIAGNOSIS — R1032 Left lower quadrant pain: Secondary | ICD-10-CM | POA: Diagnosis not present

## 2022-10-24 DIAGNOSIS — I2510 Atherosclerotic heart disease of native coronary artery without angina pectoris: Secondary | ICD-10-CM

## 2022-10-24 LAB — CBC
HCT: 37.8 % — ABNORMAL LOW (ref 39.0–52.0)
Hemoglobin: 12.2 g/dL — ABNORMAL LOW (ref 13.0–17.0)
MCH: 27.6 pg (ref 26.0–34.0)
MCHC: 32.3 g/dL (ref 30.0–36.0)
MCV: 85.5 fL (ref 80.0–100.0)
Platelets: 180 10*3/uL (ref 150–400)
RBC: 4.42 MIL/uL (ref 4.22–5.81)
RDW: 17.1 % — ABNORMAL HIGH (ref 11.5–15.5)
WBC: 10.9 10*3/uL — ABNORMAL HIGH (ref 4.0–10.5)
nRBC: 0 % (ref 0.0–0.2)

## 2022-10-24 LAB — COMPREHENSIVE METABOLIC PANEL
ALT: 37 U/L (ref 0–44)
AST: 25 U/L (ref 15–41)
Albumin: 3.7 g/dL (ref 3.5–5.0)
Alkaline Phosphatase: 169 U/L — ABNORMAL HIGH (ref 38–126)
Anion gap: 11 (ref 5–15)
BUN: 31 mg/dL — ABNORMAL HIGH (ref 6–20)
CO2: 29 mmol/L (ref 22–32)
Calcium: 8.3 mg/dL — ABNORMAL LOW (ref 8.9–10.3)
Chloride: 100 mmol/L (ref 98–111)
Creatinine, Ser: 7.44 mg/dL — ABNORMAL HIGH (ref 0.61–1.24)
GFR, Estimated: 8 mL/min — ABNORMAL LOW (ref >60)
Glucose, Bld: 82 mg/dL (ref 70–99)
Potassium: 3.5 mmol/L (ref 3.5–5.1)
Sodium: 140 mmol/L (ref 135–145)
Total Bilirubin: 0.8 mg/dL (ref 0.3–1.2)
Total Protein: 8.2 g/dL — ABNORMAL HIGH (ref 6.5–8.1)

## 2022-10-24 LAB — TYPE AND SCREEN
ABO/RH(D): O POS
Antibody Screen: NEGATIVE

## 2022-10-24 MED ORDER — ISOSORBIDE MONONITRATE ER 30 MG PO TB24: 15.0000 mg | ORAL_TABLET | Freq: Every day | ORAL | 0 refills | Status: DC

## 2022-10-24 NOTE — ED Triage Notes (Addendum)
Pt with BRB per rectum and diarrhea since Saturday.  No fever or chills reported but right sided abdominal pain is noted.  Pt is on 3L [place] at home constantly.  Hx of COPD and CHF.  Pt does state he missed dialysis Monday.

## 2022-10-24 NOTE — ED Notes (Signed)
First Nurse Note: Pt to ED via [facility] from dialysis for rectal bleeding x 4 days. Pt had his full dialysis treatment today. Pt also states that he had weakness and shortness of breath. Pt takes Plavix.

## 2022-10-24 NOTE — ED Notes (Signed)
Pt to ED complaining of blood-streaked stools since Saturday and crampy abdominal pain. Pt had full dialysis session today. R arm restricted. Wears 3L oxygen chronic. No other complaints.

## 2022-10-24 NOTE — ED Notes (Signed)
Pt to CT and back.

## 2022-10-24 NOTE — ED Provider Notes (Signed)
[facility] Provider Note    Event Date/Time   First MD Initiated Contact with Patient 10/24/22 1242     (approximate)   History   Rectal Bleeding   HPI  [name] is a 56 y.o. male with past medical history significant for ESRD on HD ([facility]), obesity, who presents to the emergency department with diarrhea.  Patient endorses a 4-day history of watery diarrhea that has had dark red blood mixed with his stool.  States that his last bowel movement was at 4:00 this morning and that he was having a continuous bowel movement from 2:00 to 4:00 this morning and then it was just water.  No recent antibiotic use.  Endorses left-sided abdominal pain.  Cramping sensation.  Denies any fever or chills.  Denies chest pain or shortness of breath.  Only makes a small amount of urine and denies any dysuria.  Denies any nausea or vomiting.  Not on anticoagulation.  Prior colonoscopy 6 months ago and had a couple of polyps that were removed.  Was told to follow-up in 5 years.  States that he missed his dialysis on Monday and Tuesday because he did not feel well but he did go to dialysis today.     Physical Exam   Triage Vital Signs: ED Triage Vitals [10/24/22 1133]  Enc Vitals Group     BP (!) 120/95     Pulse Rate 87     Resp 18     Temp 98.1 F (36.7 C)     Temp Source Oral     SpO2 92 %     Weight      Height      Head Circumference      Peak Flow      Pain Score 6     Pain Loc      Pain Edu?      Excl. in [place]?     Most recent vital signs: Vitals:   10/24/22 1400 10/24/22 1430  BP: 115/81 (!) 109/96  Pulse: 81 94  Resp: 15 13  Temp:    SpO2:      Physical Exam Constitutional:      Appearance: He is well-developed.  HENT:     Head: Atraumatic.  Eyes:     Conjunctiva/sclera: Conjunctivae normal.  Cardiovascular:     Rate and Rhythm: Regular rhythm.  Pulmonary:     Effort: No respiratory distress.  Abdominal:     General: There is no  distension.     Tenderness: There is abdominal tenderness (Left lower quadrant tenderness to palpation with no rebound or guarding).  Musculoskeletal:     Cervical back: Normal range of motion.  Skin:    General: Skin is warm.     Comments: AV fistula with palpable thrill right upper extremity.  Neurological:     Mental Status: He is alert. Mental status is at baseline.          IMPRESSION / MDM / ASSESSMENT AND PLAN / ED COURSE  I reviewed the triage vital signs and the nursing notes.  Differential diagnosis including C. difficile, medication side effect, diverticulitis, kidney stone, colitis  Will obtain lab work to further evaluate for any electrolyte abnormalities given his insensible losses with diarrhea.  Given his left lower abdominal pain and tenderness we will do a CT scan to further evaluate for intra-abdominal pathology.   RADIOLOGY I independently reviewed imaging, my interpretation of imaging: CT scan abdomen and pelvis without  contrast obtained.  No signs of diverticulitis.  No bowel obstruction.  CT scan was read as no acute findings.  No evidence of diverticulosis or diverticulitis.   ED Results / Procedures / Treatments   Labs (all labs ordered are listed, but only abnormal results are displayed) Labs interpreted as -   Does not meet criteria for emergent dialysis.  Creatinine is at his baseline.  Hemoglobin is increased from his baseline at 12.2.  C. difficile was ordered but patient did not have further bowel movement while in the emergency department.  No recent antibiotic use so have a low suspicion for C. difficile.  Labs Reviewed  COMPREHENSIVE METABOLIC PANEL - Abnormal; Notable for the following components:      Result Value   BUN 31 (*)    Creatinine, Ser 7.44 (*)    Calcium 8.3 (*)    Total Protein 8.2 (*)    Alkaline Phosphatase 169 (*)    GFR, Estimated 8 (*)    All other components within normal limits  CBC - Abnormal; Notable for the  following components:   WBC 10.9 (*)    Hemoglobin 12.2 (*)    HCT 37.8 (*)    RDW 17.1 (*)    All other components within normal limits  C DIFFICILE QUICK SCREEN W PCR REFLEX    POC OCCULT BLOOD, ED  TYPE AND SCREEN    On reevaluation patient states that he is feeling slightly better.  Discussed close follow-up with primary care provider for his bloody diarrhea, discussed not taking any Imodium and given return precautions for any worsening symptoms.   PROCEDURES:  Critical Care performed: No  Procedures  Patient's presentation is most consistent with acute presentation with potential threat to life or bodily function.   MEDICATIONS ORDERED IN ED: Medications - No data to display  FINAL CLINICAL IMPRESSION(S) / ED DIAGNOSES   Final diagnoses:  Rectal bleeding  LLQ pain  Diarrhea, unspecified type     Rx / DC Orders   ED Discharge Orders     None        Note:  This document was prepared using Dragon voice recognition software and may include unintentional dictation errors.   Nathaniel Man, MD 10/24/22 1440

## 2022-10-24 NOTE — Discharge Instructions (Addendum)
You were seen in the emergency department for diarrhea and blood in your stool with abdominal pain.  Your hemoglobin level (red blood cell count) was at your normal.  You do not need a blood transfusion.  Your CT scan did not show any reasons for your lower abdominal pain.  It is important that you follow-up with your primary care physician.  Return to the emergency department for any worsening symptoms.

## 2022-10-24 NOTE — ED Notes (Signed)
Unable to obtain labs.  Phleb called

## 2022-10-25 ENCOUNTER — Telehealth: Payer: Self-pay

## 2022-10-25 ENCOUNTER — Ambulatory Visit
Admission: RE | Admit: 2022-10-25 | Discharge: 2022-10-25 | Disposition: A | Discharge status: Home / Self Care | Payer: Medicare Other | Source: Ambulatory Visit | Attending: Student | Admitting: Student

## 2022-10-25 DIAGNOSIS — R109 Unspecified abdominal pain: Secondary | ICD-10-CM | POA: Diagnosis not present

## 2022-10-25 DIAGNOSIS — Z992 Dependence on renal dialysis: Secondary | ICD-10-CM | POA: Diagnosis not present

## 2022-10-25 DIAGNOSIS — N186 End stage renal disease: Secondary | ICD-10-CM | POA: Insufficient documentation

## 2022-10-25 NOTE — Telephone Encounter (Signed)
Forms mailed to the patient.

## 2022-10-25 NOTE — Telephone Encounter (Signed)
Transition Care Management Follow-up Telephone Call Date of discharge and from where: 10/24/22, [facility]. How have you been since you were released from the hospital? Patient says he feels absolutely the same and says the hospital did nothing for him. Patient says he still in the same condition.  Any questions or concerns? No  Items Reviewed: Did the pt receive and understand the discharge instructions provided? Yes  Medications obtained and verified? No  Other? No  Any new allergies since your discharge? No  Dietary orders reviewed? No Do you have support at home? No   Home Care and Equipment/Supplies: Were home health services ordered? not applicable If so, what is the name of the agency? NA  Has the agency set up a time to come to the patient's home? not applicable Were any new equipment or medical supplies ordered?  No What is the name of the medical supply agency? NA Were you able to get the supplies/equipment? not applicable Do you have any questions related to the use of the equipment or supplies? No  Functional Questionnaire: (I = Independent and D = Dependent) ADLs: I  Bathing/Dressing- I  Meal Prep- I  Eating- I  Maintaining continence- I  Transferring/Ambulation- I  Managing Meds- I  Follow up appointments reviewed:  PCP Hospital f/u appt confirmed? No . [facility] f/u appt confirmed? No . Are transportation arrangements needed? No  If their condition worsens, is the pt aware to call PCP or go to the Emergency Dept.? Yes Was the patient provided with contact information for the PCP's office or ED? Yes Was to pt encouraged to call back with questions or concerns? Yes

## 2022-10-25 NOTE — Telephone Encounter (Signed)
Copied from [place] [phone]. Topic: General - Other >> Oct 25, 2022  8:32 AM Chapman Fitch wrote: Reason for CRM: Pt stated that the office has a [employer] form that was completed that needs his signature / he stated he needs the form and asked if the office can mail the form to the confirmed address on his chart asap / please advise

## 2022-11-06 ENCOUNTER — Ambulatory Visit: Payer: Medicare Other | Attending: Cardiology | Admitting: Cardiology

## 2022-11-06 ENCOUNTER — Encounter: Payer: Self-pay | Admitting: Cardiology

## 2022-11-06 VITALS — BP 110/78 | HR 83 | Ht 63.0 in | Wt 247.6 lb

## 2022-11-06 DIAGNOSIS — I4901 Ventricular fibrillation: Secondary | ICD-10-CM

## 2022-11-06 DIAGNOSIS — I2510 Atherosclerotic heart disease of native coronary artery without angina pectoris: Secondary | ICD-10-CM | POA: Diagnosis not present

## 2022-11-06 DIAGNOSIS — I10 Essential (primary) hypertension: Secondary | ICD-10-CM

## 2022-11-06 DIAGNOSIS — I255 Ischemic cardiomyopathy: Secondary | ICD-10-CM | POA: Diagnosis not present

## 2022-11-06 DIAGNOSIS — R29898 Other symptoms and signs involving the musculoskeletal system: Secondary | ICD-10-CM

## 2022-11-06 MED ORDER — SEMAGLUTIDE(0.25 OR 0.5MG/DOS) 2 MG/3ML ~~LOC~~ SOPN: PEN_INJECTOR | SUBCUTANEOUS | 1 refills | Status: AC

## 2022-11-06 NOTE — Patient Instructions (Signed)
Medication Instructions:   Your physician has recommended you make the following change in your medication:    Start taking Ozempic:  Inject 0.25 MG into skin once a week, for 4 weeks.     Inject 0.50 MG into skin once a week, for 4 weeks. (Call or send [place] a MyChart message when you are on week 2, so we can send your next dose in for you).     Inject 1.0 MG into skin once a week, for 4 weeks.     Inject 2.0 MG into skin once a week, and continue at this dose.    2. STOP Trulicity   *If you need a refill on your cardiac medications before your next appointment, please call your pharmacy*    Follow-Up: At [facility], you and your health needs are our priority.  As part of our continuing mission to provide you with exceptional heart care, we have created designated Provider Care Teams.  These Care Teams include your primary Cardiologist (physician) and Advanced Practice Providers (APPs -  Physician Assistants and Nurse Practitioners) who all work together to provide you with the care you need, when you need it.  We recommend signing up for the patient portal called "MyChart".  Sign up information is provided on this After Visit Summary.  MyChart is used to connect with patients for Virtual Visits (Telemedicine).  Patients are able to view lab/test results, encounter notes, upcoming appointments, etc.  Non-urgent messages can be sent to your provider as well.   To learn more about what you can do with MyChart, go to [email or web address].    Your next appointment:   2 -3 month(s)  The format for your next appointment:   In Person  Provider:   Kate Sable, MD

## 2022-11-06 NOTE — Progress Notes (Signed)
Cardiology Office Note:    Date:  11/06/2022   ID:  [name], DOB 07-Jul-1966, MRN [id number]  PCP:  Venita Lick, NP  Cardiologist:  Kate Sable, MD  Electrophysiologist:  Vickie Epley, MD    Chief Complaint  Patient presents with   Follow-up    8 week follow up, weakness in less    History of Present Illness:    [name] is a 56 y.o. male with a hx of CAD(PCI to RCA 2019, PCI to LAD and Lcx 08/2021) VF arrest 04/2021 (deemed ischemic w/ subsequent pci), cardiomyopathy EF 26% , hypertension, diabetes, ESRD on dialysis 3 times a week, OSA, COPD, who presents for follow-up.   States doing okay, his legs have been weak of late, giving out easily sometimes when he tries to walk.  Undergoing dialysis 4 times a week to help keep fluid balance low.  Denies chest pain.  On Trulicity for diabetes, has not lost much weight, states eating healthier more fruits, more vegetables in his diet.  On low-dose torsemide and Entresto, up titration of GDMT limited by blood pressures.  No new concerns at this time.    Prior notes CMR LVEF 26%, anterior lateral wall subendocardial scar. Echo 09/2021 EF 35 to 40%. Echo 04/2021 EF 50 to 55% LHC 07/2021 80% LAD, 90% Lcx , patent mid RCA stent s/p pci in 08/2021 with DES to LAD and Lcx. echo on 09/2019 showed mildly reduced EF 45 to 50%,  Lexiscan 08/2019 did not show any evidence for ischemia.  Not on Aldactone due to previous hyperkalemia.  Past Medical History:  Diagnosis Date   Anemia of chronic disease    Bell's palsy    Cataract    Chronic combined systolic (congestive) and diastolic (congestive) heart failure (HCC)    Chronic respiratory failure with hypoxia (HCC)    3 lpm continuous   COPD (chronic obstructive pulmonary disease) (HCC)    Coronary artery disease    Diabetes mellitus without complication (HCC)    Dyspnea    Wheezing   ESRD on hemodialysis (HCC)    GERD (gastroesophageal reflux disease)     Gout    HOH (hard of hearing)    mild   Hypercholesterolemia    Hypertension    NSVT (nonsustained ventricular tachycardia) (HCC)    Obstructive sleep apnea    CPAP, O2 use continuosly at 3LPM   Orthopnea    Pneumonia    In Past X2 most recent 1 yr ago   Psoriasis    Pulmonary HTN ([place])    Ventricular fibrillation [facility])     Past Surgical History:  Procedure Laterality Date   A/V FISTULAGRAM Right 05/02/2021   Procedure: A/V FISTULAGRAM;  Surgeon: Katha Cabal, MD;  Location: [place] [facility];  Service: Cardiovascular;  Laterality: Right;   AV FISTULA PLACEMENT Right 11/20/2019   Procedure: ARTERIOVENOUS (AV) FISTULA CREATION ( BRACHIAL CEPHALIC );  Surgeon: Katha Cabal, MD;  Location: [facility];  Service: Vascular;  Laterality: Right;   CATARACT EXTRACTION W/PHACO Left 12/17/2019   Procedure: CATARACT EXTRACTION PHACO AND INTRAOCULAR LENS PLACEMENT ([place]) LEFT ISTENT INJ DIABETIC;  Surgeon: Eulogio Bear, MD;  Location: [facility];  Service: Ophthalmology;  Laterality: Left;  [place] 00:33.2 CDE 2.96 Fluid Pack Lot # [id number]   CATARACT EXTRACTION W/PHACO Right 02/24/2020   Procedure: CATARACT EXTRACTION PHACO AND INTRAOCULAR LENS PLACEMENT ([facility]) RIGHT DIABETIC ISTENT INJ;  Surgeon: Eulogio Bear, MD;  Location: [facility];  Service: Ophthalmology;  Laterality: Right;  [place] 00:37.3 CDE 2.88 Fluid Pack Lot # [id number]   COLONOSCOPY WITH PROPOFOL N/A 03/06/2022   Procedure: COLONOSCOPY WITH PROPOFOL;  Surgeon: Jonathon Bellows, MD;  Location: [facility];  Service: Gastroenterology;  Laterality: N/A;  ARRIVAL IS 6:45 AM - TRANSPORTATION IS ACTA. PATIENT CALLED 02/28/2022.   CORONARY ANGIOPLASTY WITH STENT PLACEMENT     stent placement   CORONARY STENT INTERVENTION N/A 08/28/2021   Procedure: CORONARY STENT INTERVENTION;  Surgeon: Nelva Bush, MD;  Location: [place] [facility];  Service: Cardiovascular;  Laterality: N/A;   DIALYSIS/PERMA CATHETER INSERTION N/A  06/22/2019   Procedure: DIALYSIS/PERMA CATHETER INSERTION;  Surgeon: Algernon Huxley, MD;  Location: [place] [facility];  Service: Cardiovascular;  Laterality: N/A;   DIALYSIS/PERMA CATHETER REMOVAL N/A 05/31/2020   Procedure: DIALYSIS/PERMA CATHETER REMOVAL;  Surgeon: Katha Cabal, MD;  Location: [place] [facility];  Service: Cardiovascular;  Laterality: N/A;   EYE SURGERY     INSERTION OF AHMED VALVE Left 12/17/2019   Procedure: INSERTION OF iSTENT;  Surgeon: Eulogio Bear, MD;  Location: [facility];  Service: Ophthalmology;  Laterality: Left;   INTRAVASCULAR IMAGING/OCT N/A 08/28/2021   Procedure: INTRAVASCULAR IMAGING/OCT;  Surgeon: Nelva Bush, MD;  Location: [place] [facility];  Service: Cardiovascular;  Laterality: N/A;   LEFT HEART CATH AND CORONARY ANGIOGRAPHY N/A 08/08/2021   Procedure: LEFT HEART CATH AND CORONARY ANGIOGRAPHY;  Surgeon: Nelva Bush, MD;  Location: [place] [facility];  Service: Cardiovascular;  Laterality: N/A;    Current Medications: Current Meds  Medication Sig   albuterol (PROVENTIL HFA;VENTOLIN HFA) 108 (90 Base) MCG/ACT inhaler Inhale 2 puffs into the lungs every 4 (four) hours as needed for wheezing or shortness of breath.   allopurinol (ZYLOPRIM) 100 MG tablet Take 1 tablet (100 mg total) by mouth daily.   aspirin EC 81 MG tablet Take 81 mg by mouth in the morning. Swallow whole.   atorvastatin (LIPITOR) 40 MG tablet Take 1 tablet (40 mg total) by mouth at bedtime.   benzonatate (TESSALON) 200 MG capsule Take 1 capsule (200 mg total) by mouth 3 (three) times daily as needed for cough.   calcium acetate (PHOSLO) 667 MG capsule Take 1 capsule (667 mg total) by mouth 3 (three) times daily with meals.   Cholecalciferol (D3-1000) 25 MCG (1000 UT) tablet Take 2 tablets (2,000 Units total) by mouth daily.   clopidogrel (PLAVIX) 75 MG tablet Take 1 tablet (75 mg total) by mouth daily.   cyclobenzaprine (FLEXERIL) 5 MG tablet Take 5 mg by mouth  at bedtime as needed.   dapagliflozin propanediol (FARXIGA) 10 MG TABS tablet Take 1 tablet (10 mg total) by mouth daily before breakfast.   docusate sodium (COLACE) 100 MG capsule Take 100 mg by mouth 2 (two) times daily. As needed   ENTRESTO 24-26 MG TAKE ONE TABLET BY MOUTH TWICE DAILY   gabapentin (NEURONTIN) 300 MG capsule Take 1 capsule (300 mg total) by mouth at bedtime.   insulin degludec (TRESIBA FLEXTOUCH) 100 UNIT/ML FlexTouch Pen Inject 16 Units into the skin at bedtime.   isosorbide mononitrate (IMDUR) 30 MG 24 hr tablet Take 0.5 tablets (15 mg total) by mouth daily.   lidocaine (XYLOCAINE) 2 % jelly 1 application. by Other route every 6 (six) hours.   metoprolol succinate (TOPROL-XL) 25 MG 24 hr tablet Take 1 tablet (25 mg total) by mouth daily.   Multiple Vitamins-Minerals (TAB-A-VITE) TABS TAKE 1 TABLET BY MOUTH ONCE DAILY.  ondansetron (ZOFRAN) 4 MG tablet Take 1 tablet (4 mg total) by mouth every 8 (eight) hours as needed for nausea or vomiting.   OXYGEN Inhale 3 L into the lungs daily.   pantoprazole (PROTONIX) 40 MG tablet Take 1 tablet (40 mg total) by mouth daily.   polyethylene glycol (MIRALAX / GLYCOLAX) 17 g packet Take 17 g by mouth 2 (two) times daily.   Risankizumab-rzaa (SKYRIZI) 150 MG/ML SOSY Inject 150 mg into the skin as directed. Every 12 weeks for maintenance.   Semaglutide,0.25 or 0.'5MG'$ /DOS, 2 MG/3ML SOPN Inject 0.25 MG into skin once a week, for 4 weeks, then Inject 0.50 MG into skin once a week, for 4 weeks.   silodosin (RAPAFLO) 4 MG CAPS capsule TAKE 1 CAPSULE ONCE DAILY WITH BREAKFAST   Suvorexant (BELSOMRA) 10 MG TABS Take 1 tablet by mouth at bedtime as needed.   Tapinarof (VTAMA) 1 % CREA Apply 1 Application topically daily. Qd to aa psoriasis on back, abdomen, and legs until clear, then prn flares   tiZANidine (ZANAFLEX) 2 MG tablet Take 1 tablet (2 mg total) by mouth daily as needed for muscle spasms.   Torsemide 40 MG TABS Take ONE tablet (40 mg)  TWICE daily only on days you DO NOT have dialysis: Tuesday, Thursday, Saturday and Sunday   triamcinolone cream (KENALOG) 0.1 % apply to affected areas of psoriasis once daily up to 5 days a week until clear then use as needed for flares; avoid use on face, groin, and underarms.   [DISCONTINUED] TRULICITY 3 MP/5.3IR SOPN Inject 3 mg into the skin every Thursday.     Allergies:   Shellfish allergy, Betadine [povidone iodine], and Povidone-iodine   Social History   Socioeconomic History   Marital status: Single    Spouse name: Not on file   Number of children: Not on file   Years of education: Not on file   Highest education level: Not on file  Occupational History    Comment: disabled  Tobacco Use   Smoking status: Never   Smokeless tobacco: Never  Vaping Use   Vaping Use: Never used  Substance and Sexual Activity   Alcohol use: Not Currently    Alcohol/week: 1.0 standard drink of alcohol    Types: 1 Cans of beer per week    Comment: rare occassion   Drug use: Not Currently    Types: Marijuana    Comment: in high school    Sexual activity: Not on file  Other Topics Concern   Not on file  Social History Narrative   Patient uses continuous oxygen at 3L/min   Dialysis, AV fistula in L arm   Lives at home by himself ; friend gives pt. Transportation    Social Determinants of Health   Financial Resource Strain: Low Risk  (03/05/2022)   Overall Financial Resource Strain (CARDIA)    Difficulty of Paying Living Expenses: Not hard at all  Food Insecurity: No Food Insecurity (03/05/2022)   Hunger Vital Sign    Worried About Running Out of Food in the Last Year: Never true    Ran Out of Food in the Last Year: Never true  Transportation Needs: Unmet Transportation Needs (05/30/2022)   PRAPARE - Transportation    Lack of Transportation (Medical): Yes    Lack of Transportation (Non-Medical): Yes  Physical Activity: Insufficiently Active (03/05/2022)   Exercise Vital Sign    Days of  Exercise per Week: 3 days    Minutes of Exercise per Session:  30 min  Stress: No Stress Concern Present (03/05/2022)   [place]    Feeling of Stress : Not at all  Social Connections: Unknown (03/05/2022)   Social Connection and Isolation Panel [NHANES]    Frequency of Communication with Friends and Family: More than three times a week    Frequency of Social Gatherings with Friends and Family: Once a week    Attends Religious Services: Never    Marine scientist or Organizations: No    Attends Music therapist: Never    Marital Status: Not on file     Family History: The patient's family history includes Heart failure in his mother; Kidney failure in his brother.  ROS:   Please see the history of present illness.     All other systems reviewed and are negative.  EKGs/Labs/Other Studies Reviewed:    The following studies were reviewed today:   EKG:  EKG is ordered today.  EKG shows normal sinus rhythm Recent Labs: 01/08/2022: B Natriuretic Peptide 116.9 07/04/2022: TSH 3.810 10/24/2022: ALT 37; BUN 31; Creatinine, Ser 7.44; Hemoglobin 12.2; Platelets 180; Potassium 3.5; Sodium 140  Recent Lipid Panel    Component Value Date/Time   CHOL 96 (L) 07/04/2022 1402   TRIG 78 07/04/2022 1402   HDL 37 (L) 07/04/2022 1402   CHOLHDL 4.2 05/09/2021 0453   VLDL 23 05/09/2021 0453   LDLCALC 43 07/04/2022 1402    Physical Exam:    VS:  BP 110/78 (BP Location: Left Arm, Patient Position: Sitting, Cuff Size: Large)   Pulse 83   Ht '5\' 3"'$  (1.6 m)   Wt 247 lb 9.6 oz (112.3 kg)   SpO2 93% Comment: 2 liters  BMI 43.86 kg/m     Wt Readings from Last 3 Encounters:  11/06/22 247 lb 9.6 oz (112.3 kg)  10/05/22 244 lb 3.2 oz (110.8 kg)  08/13/22 241 lb 3.2 oz (109.4 kg)     GEN:  Well nourished, well developed in no acute distress, nasal canula HEENT: Normal NECK: No JVD; No carotid bruits CARDIAC: RRR,  no murmurs, rubs, gallops RESPIRATORY:  decreased breath sounds at bases. ABDOMEN: Soft, non-tender, distended MUSCULOSKELETAL:  no edema; No deformity  SKIN: Warm and dry NEUROLOGIC:  Alert and oriented x 3 PSYCHIATRIC:  Normal affect   ASSESSMENT:    1. Coronary artery disease involving native coronary artery of native heart without angina pectoris   2. Ventricular fibrillation ([place])   3. Primary hypertension   4. Ischemic cardiomyopathy   5. Morbid obesity ([place])   6. Weakness of both lower extremities    PLAN:     CAD (PCI to RCA 2019, PCI to LAD and LCx 08/2021).  Denies chest pain.  Continue aspirin, Plavix, Lipitor 40,   Imdur 15 mg daily.   hx ventricular fibrillation.  ?ischemia induced s/p PCI to LAD and left circumflex.  CMR 26%, anterior and lateral wall, endocardial scar.    Previously evaluated by EP, not suitable candidate for ICD. Hypertension, BP controlled.  Continue Toprol-XL '25mg'$  qd, Entresto 24-26 mg twice daily. Ischemic cardiomyopathy, CMR with EF 26%.  BP low with dialysis.  Continue Toprol xl to '25mg'$  qd, Entresto 24-26 mg twice daily, farxiga '10mg'$ .   Morbid obesity, low-calorie diet, weight loss advised.  Stop Trulicity, start Ozempic to help with obesity. Lower extremity weakness, may benefit from physical therapy, follow-up with PCP regarding this.  Weight loss as above will be beneficial.  Follow-up in 2 to 3 months   Medication Adjustments/Labs and Tests Ordered: Current medicines are reviewed at length with the patient today.  Concerns regarding medicines are outlined above.  Orders Placed This Encounter  Procedures   EKG 12-Lead       Meds ordered this encounter  Medications   Semaglutide,0.25 or 0.'5MG'$ /DOS, 2 MG/3ML SOPN    Sig: Inject 0.25 MG into skin once a week, for 4 weeks, then Inject 0.50 MG into skin once a week, for 4 weeks.    Dispense:  3 mL    Refill:  1      Patient Instructions  Medication Instructions:   Your physician  has recommended you make the following change in your medication:    Start taking Ozempic:  Inject 0.25 MG into skin once a week, for 4 weeks.     Inject 0.50 MG into skin once a week, for 4 weeks. (Call or send [place] a MyChart message when you are on week 2, so we can send your next dose in for you).     Inject 1.0 MG into skin once a week, for 4 weeks.     Inject 2.0 MG into skin once a week, and continue at this dose.    2. STOP Trulicity   *If you need a refill on your cardiac medications before your next appointment, please call your pharmacy*    Follow-Up: At [facility], you and your health needs are our priority.  As part of our continuing mission to provide you with exceptional heart care, we have created designated Provider Care Teams.  These Care Teams include your primary Cardiologist (physician) and Advanced Practice Providers (APPs -  Physician Assistants and Nurse Practitioners) who all work together to provide you with the care you need, when you need it.  We recommend signing up for the patient portal called "MyChart".  Sign up information is provided on this After Visit Summary.  MyChart is used to connect with patients for Virtual Visits (Telemedicine).  Patients are able to view lab/test results, encounter notes, upcoming appointments, etc.  Non-urgent messages can be sent to your provider as well.   To learn more about what you can do with MyChart, go to [email or web address].    Your next appointment:   2 -3 month(s)  The format for your next appointment:   In Person  Provider:   Kate Sable, MD    Signed, Kate Sable, MD  11/06/2022 9:39 AM    [place]

## 2022-11-07 ENCOUNTER — Telehealth: Payer: Self-pay

## 2022-11-07 NOTE — Telephone Encounter (Signed)
Paperwork from [employer] has been filled out and faxed back

## 2022-11-18 NOTE — Patient Instructions (Signed)
Chronic Back Pain When back pain lasts longer than 3 months, it is called chronic back pain. Pain may get worse at certain times (flare-ups). There are things you can do at home to manage your pain. Follow these instructions at home: Pay attention to any changes in your symptoms. Take these actions to help with your pain: Managing pain and stiffness     If told, put ice on the painful area. Your doctor may tell you to use ice for 24-48 hours after the flare-up starts. To do this: Put ice in a plastic bag. Place a towel between your skin and the bag. Leave the ice on for 20 minutes, 2-3 times a day. If told, put heat on the painful area. Do this as often as told by your doctor. Use the heat source that your doctor recommends, such as a moist heat pack or a heating pad. Place a towel between your skin and the heat source. Leave the heat on for 20-30 minutes. Take off the heat if your skin turns bright red. This is especially important if you are unable to feel pain, heat, or cold. You may have a greater risk of getting burned. Soak in a warm bath. This can help relieve pain. Activity  Avoid bending and other activities that make pain worse. When standing: Keep your upper back and neck straight. Keep your shoulders pulled back. Avoid slouching. When sitting: Keep your back straight. Relax your shoulders. Do not round your shoulders or pull them backward. Do not sit or stand in one place for long periods of time. Take short rest breaks during the day. Lying down or standing is usually better than sitting. Resting can help relieve pain. When sitting or lying down for a long time, do some mild activity or stretching. This will help to prevent stiffness and pain. Get regular exercise. Ask your doctor what activities are safe for you. Do not lift anything that is heavier than 10 lb (4.5 kg) or the limit that you are told, until your doctor says that it is safe. To prevent injury when you lift  things: Bend your knees. Keep the weight close to your body. Avoid twisting. Sleep on a firm mattress. Try lying on your side with your knees slightly bent. If you lie on your back, put a pillow under your knees. Medicines Treatment may include medicines for pain and swelling taken by mouth or put on the skin, prescription pain medicine, or muscle relaxants. Take over-the-counter and prescription medicines only as told by your doctor. Ask your doctor if the medicine prescribed to you: Requires you to avoid driving or using machinery. Can cause trouble pooping (constipation). You may need to take these actions to prevent or treat trouble pooping: Drink enough fluid to keep your pee (urine) pale yellow. Take over-the-counter or prescription medicines. Eat foods that are high in fiber. These include beans, whole grains, and fresh fruits and vegetables. Limit foods that are high in fat and sugars. These include fried or sweet foods. General instructions Do not use any products that contain nicotine or tobacco, such as cigarettes, e-cigarettes, and chewing tobacco. If you need help quitting, ask your doctor. Keep all follow-up visits as told by your doctor. This is important. Contact a doctor if: Your pain does not get better with rest or medicine. Your pain gets worse, or you have new pain. You have a high fever. You lose weight very quickly. You have trouble doing your normal activities. Get help right away   if: One or both of your legs or feet feel weak. One or both of your legs or feet lose feeling (have numbness). You have trouble controlling when you poop (have a bowel movement) or pee (urinate). You have bad back pain and: You feel like you may vomit (nauseous), or you vomit. You have pain in your belly (abdomen). You have shortness of breath. You faint. Summary When back pain lasts longer than 3 months, it is called chronic back pain. Pain may get worse at certain times  (flare-ups). Use ice and heat as told by your doctor. Your doctor may tell you to use ice after flare-ups. This information is not intended to replace advice given to you by your health care provider. Make sure you discuss any questions you have with your health care provider. Document Revised: 01/13/2020 Document Reviewed: 01/13/2020 Elsevier Patient Education  2023 [employer]vier Inc[employer]  

## 2022-11-19 ENCOUNTER — Other Ambulatory Visit: Payer: Self-pay

## 2022-11-19 DIAGNOSIS — I2510 Atherosclerotic heart disease of native coronary artery without angina pectoris: Secondary | ICD-10-CM

## 2022-11-19 MED ORDER — ISOSORBIDE MONONITRATE ER 30 MG PO TB24: 15.0000 mg | ORAL_TABLET | Freq: Every day | ORAL | 0 refills | Status: AC

## 2022-11-19 MED ORDER — METOPROLOL SUCCINATE ER 25 MG PO TB24: 25.0000 mg | ORAL_TABLET | Freq: Every day | ORAL | 0 refills | Status: AC

## 2022-11-20 ENCOUNTER — Other Ambulatory Visit: Payer: Self-pay | Admitting: Dermatology

## 2022-11-20 DIAGNOSIS — L409 Psoriasis, unspecified: Secondary | ICD-10-CM

## 2022-11-22 ENCOUNTER — Encounter: Payer: Self-pay | Admitting: Nurse Practitioner

## 2022-11-22 ENCOUNTER — Ambulatory Visit (INDEPENDENT_AMBULATORY_CARE_PROVIDER_SITE_OTHER): Payer: Medicare Other | Admitting: Nurse Practitioner

## 2022-11-22 VITALS — BP 110/64 | Temp 98.0°F | Ht 62.99 in | Wt 246.2 lb

## 2022-11-22 DIAGNOSIS — R21 Rash and other nonspecific skin eruption: Secondary | ICD-10-CM

## 2022-11-22 MED ORDER — CLOBETASOL PROPIONATE 0.05 % EX CREA: 1.0000 | TOPICAL_CREAM | Freq: Two times a day (BID) | CUTANEOUS | 0 refills | Status: DC

## 2022-11-22 MED ORDER — ACYCLOVIR 800 MG PO TABS: 800.0000 mg | ORAL_TABLET | Freq: Three times a day (TID) | ORAL | 0 refills | Status: DC

## 2022-11-22 NOTE — Assessment & Plan Note (Signed)
Acute for one week with pruritus.  Recommend he continue Benadryl as needed and Calamine.  ?varicella, in appearance it presents similarly.  Will start Acyclovir renal dosed her his CrCl 18 = 800 MG every 8 hours and start Clobetasol to areas of rash.  Recommend oatmeal wash or baths for comfort.  Return in one week.

## 2022-11-22 NOTE — Progress Notes (Signed)
BP 110/64 (BP Location: Left Arm, Patient Position: Sitting, Cuff Size: Large)   Temp 98 F (36.7 C) (Oral)   Ht 5' 2.99" (1.6 m)   Wt 246 lb 3.2 oz (111.7 kg)   SpO2 94% Comment: on 3 liters of O2  BMI 43.62 kg/m    Subjective:    Patient ID: [name], male    DOB: 1966-09-23, 56 y.o.   MRN: [id number]  HPI: [name] is a 56 y.o. male  Chief Complaint  Patient presents with   Rash    Started last week, itches. Patient has tried OTC Benadryl and Calamine lotion that did not really help   RASH Started with rash last week -- all over.  Using Benadryl and Calamine for rash.  A lady from dialysis gave him pillows from her house. Duration:  weeks  Location: generalized  Itching: yes Burning: no Redness: yes Oozing: no Scaling: no Blisters: no Painful: yes Fevers: no Change in detergents/soaps/personal care products: no Recent illness: no Recent travel:no History of same: no Context: fluctuating Alleviating factors: hydrocortisone cream and benadryl Treatments attempted:hydrocortisone cream and benadryl Shortness of breath: no  Throat/tongue swelling: no Myalgias/arthralgias: no   Relevant past medical, surgical, family and social history reviewed and updated as indicated. Interim medical history since our last visit reviewed. Allergies and medications reviewed and updated.  Review of Systems  Constitutional:  Negative for activity change, diaphoresis, fatigue and fever.  Respiratory:  Negative for cough, chest tightness, shortness of breath and wheezing.   Cardiovascular:  Negative for chest pain, palpitations and leg swelling.  Gastrointestinal: Negative.   Endocrine: Negative for cold intolerance, heat intolerance, polydipsia, polyphagia and polyuria.  Skin:  Positive for rash.  Neurological: Negative.   Psychiatric/Behavioral: Negative.      Per HPI unless specifically indicated above     Objective:    BP 110/64 (BP Location: Left  Arm, Patient Position: Sitting, Cuff Size: Large)   Temp 98 F (36.7 C) (Oral)   Ht 5' 2.99" (1.6 m)   Wt 246 lb 3.2 oz (111.7 kg)   SpO2 94% Comment: on 3 liters of O2  BMI 43.62 kg/m   Wt Readings from Last 3 Encounters:  11/22/22 246 lb 3.2 oz (111.7 kg)  11/06/22 247 lb 9.6 oz (112.3 kg)  10/05/22 244 lb 3.2 oz (110.8 kg)    Physical Exam Vitals and nursing note reviewed.  Constitutional:      General: He is awake. He is not in acute distress.    Appearance: He is well-developed and well-groomed. He is morbidly obese. He is not ill-appearing or toxic-appearing.  HENT:     Head: Normocephalic and atraumatic.     Right Ear: Hearing normal. No drainage.     Left Ear: Hearing normal. No drainage.  Eyes:     General: Lids are normal.        Right eye: No discharge.        Left eye: No discharge.     Conjunctiva/sclera: Conjunctivae normal.     Pupils: Pupils are equal, round, and reactive to light.  Neck:     Thyroid: No thyromegaly.     Vascular: No carotid bruit.  Cardiovascular:     Rate and Rhythm: Normal rate and regular rhythm.     Heart sounds: Normal heart sounds, S1 normal and S2 normal. No murmur heard.    No gallop.     Arteriovenous access: Left arteriovenous access is present. Pulmonary:  Effort: Pulmonary effort is normal. No accessory muscle usage or respiratory distress.     Breath sounds: Normal breath sounds.     Comments: O2 by nasal cannula in place, 3L. Abdominal:     General: Bowel sounds are normal. There is no distension.     Palpations: Abdomen is soft.     Tenderness: There is no abdominal tenderness.  Musculoskeletal:        General: Normal range of motion.     Cervical back: Normal range of motion and neck supple.     Right lower leg: No edema.     Left lower leg: No edema.  Lymphadenopathy:     Cervical: No cervical adenopathy.  Skin:    General: Skin is warm and dry.     Capillary Refill: Capillary refill takes less than 2 seconds.      Findings: Rash present. Rash is papular.     Comments: Multiple areas to bilateral arms, abdomen, and sides of face with cohesive rash in cluster type and individual areas.  Round in shape with no open vesicles, currently with crusting and scaling.  No linear marks.  Neurological:     Mental Status: He is alert and oriented to person, place, and time.     Deep Tendon Reflexes: Reflexes are normal and symmetric.  Psychiatric:        Attention and Perception: Attention normal.        Mood and Affect: Mood normal.        Speech: Speech normal.        Behavior: Behavior normal. Behavior is cooperative.        Thought Content: Thought content normal.     Results for orders placed or performed during the hospital encounter of 10/24/22  Comprehensive metabolic panel  Result Value Ref Range   Sodium 140 135 - 145 mmol/L   Potassium 3.5 3.5 - 5.1 mmol/L   Chloride 100 98 - 111 mmol/L   CO2 29 22 - 32 mmol/L   Glucose, Bld 82 70 - 99 mg/dL   BUN 31 (H) 6 - 20 mg/dL   Creatinine, Ser 7.44 (H) 0.61 - 1.24 mg/dL   Calcium 8.3 (L) 8.9 - 10.3 mg/dL   Total Protein 8.2 (H) 6.5 - 8.1 g/dL   Albumin 3.7 3.5 - 5.0 g/dL   AST 25 15 - 41 U/L   ALT 37 0 - 44 U/L   Alkaline Phosphatase 169 (H) 38 - 126 U/L   Total Bilirubin 0.8 0.3 - 1.2 mg/dL   GFR, Estimated 8 (L) >60 mL/min   Anion gap 11 5 - 15  CBC  Result Value Ref Range   WBC 10.9 (H) 4.0 - 10.5 K/uL   RBC 4.42 4.22 - 5.81 MIL/uL   Hemoglobin 12.2 (L) 13.0 - 17.0 g/dL   HCT 37.8 (L) 39.0 - 52.0 %   MCV 85.5 80.0 - 100.0 fL   MCH 27.6 26.0 - 34.0 pg   MCHC 32.3 30.0 - 36.0 g/dL   RDW 17.1 (H) 11.5 - 15.5 %   Platelets 180 150 - 400 K/uL   nRBC 0.0 0.0 - 0.2 %  Type and screen [facility]  Result Value Ref Range   ABO/RH(D) O POS    Antibody Screen NEG    Sample Expiration      10/27/2022,2359 Performed at [facility], [address], [place], [place] [address]       Assessment & Plan:  Problem List Items Addressed This Visit       Musculoskeletal and Integument   Rash - Primary    Acute for one week with pruritus.  Recommend he continue Benadryl as needed and Calamine.  ?varicella, in appearance it presents similarly.  Will start Acyclovir renal dosed her his CrCl 18 = 800 MG every 8 hours and start Clobetasol to areas of rash.  Recommend oatmeal wash or baths for comfort.  Return in one week.        Follow up plan: Return in about 1 week (around 11/29/2022) for RASH.

## 2022-12-02 NOTE — Patient Instructions (Signed)
Rash, Adult  A rash is a change in the color of your skin. A rash can also change the way your skin feels. There are many different conditions and factors that can cause a rash. Follow these instructions at home: The goal of treatment is to stop the itching and keep the rash from spreading. Watch for any changes in your symptoms. Let your doctor know about them. Follow these instructions to help with your condition: Medicine Take or apply over-the-counter and prescription medicines only as told by your doctor. These may include medicines: To treat red or swollen skin (corticosteroid creams). To treat itching. To treat an allergy (oral antihistamines). To treat very bad symptoms (oral corticosteroids).  Skin care Put cool cloths (compresses) on the affected areas. Do not scratch or rub your skin. Avoid covering the rash. Make sure that the rash is exposed to air as much as possible. Managing itching and discomfort Avoid hot showers or baths. These can make itching worse. A cold shower may help. Try taking a bath with: Epsom salts. You can get these at your local pharmacy or grocery store. Follow the instructions on the package. Baking soda. Pour a small amount into the bath as told by your doctor. Colloidal oatmeal. You can get this at your local pharmacy or grocery store. Follow the instructions on the package. Try putting baking soda paste onto your skin. Stir water into baking soda until it gets like a paste. Try putting on a lotion that relieves itchiness (calamine lotion). Keep cool and out of the sun. Sweating and being hot can make itching worse. General instructions  Rest as needed. Drink enough fluid to keep your pee (urine) pale yellow. Wear loose-fitting clothing. Avoid scented soaps, detergents, and perfumes. Use gentle soaps, detergents, perfumes, and other cosmetic products. Avoid anything that causes your rash. Keep a journal to help track what causes your rash. Write  down: What you eat. What cosmetic products you use. What you drink. What you wear. This includes jewelry. Keep all follow-up visits as told by your doctor. This is important. Contact a doctor if: You sweat at night. You lose weight. You pee (urinate) more than normal. You pee less than normal, or you notice that your pee is a darker color than normal. You feel weak. You throw up (vomit). Your skin or the whites of your eyes look yellow (jaundice). Your skin: Tingles. Is numb. Your rash: Does not go away after a few days. Gets worse. You are: More thirsty than normal. More tired than normal. You have: New symptoms. Pain in your belly (abdomen). A fever. Watery poop (diarrhea). Get help right away if: You have a fever and your symptoms suddenly get worse. You start to feel mixed up (confused). You have a very bad headache or a stiff neck. You have very bad joint pains or stiffness. You have jerky movements that you cannot control (seizure). Your rash covers all or most of your body. The rash may or may not be painful. You have blisters that: Are on top of the rash. Grow larger. Grow together. Are painful. Are inside your nose or mouth. You have a rash that: Looks like purple pinprick-sized spots all over your body. Has a "bull's eye" or looks like a target. Is red and painful, causes your skin to peel, and is not from being in the sun too long. Summary A rash is a change in the color of your skin. A rash can also change the way your skin   feels. The goal of treatment is to stop the itching and keep the rash from spreading. Take or apply over-the-counter and prescription medicines only as told by your doctor. Contact a doctor if you have new symptoms or symptoms that get worse. Keep all follow-up visits as told by your doctor. This is important. This information is not intended to replace advice given to you by your health care provider. Make sure you discuss any  questions you have with your health care provider. Document Revised: 06/05/2022 Document Reviewed: 09/14/2021 Elsevier Patient Education  2023 Elsevier Inc.  

## 2022-12-04 ENCOUNTER — Encounter: Payer: Self-pay | Admitting: Nurse Practitioner

## 2022-12-04 ENCOUNTER — Ambulatory Visit (INDEPENDENT_AMBULATORY_CARE_PROVIDER_SITE_OTHER): Payer: Medicare Other | Admitting: Nurse Practitioner

## 2022-12-04 VITALS — BP 128/85 | HR 81 | Temp 97.7°F | Ht 62.99 in | Wt 243.5 lb

## 2022-12-04 DIAGNOSIS — R21 Rash and other nonspecific skin eruption: Secondary | ICD-10-CM

## 2022-12-04 MED ORDER — CLOBETASOL PROPIONATE 0.05 % EX CREA: 1.0000 | TOPICAL_CREAM | Freq: Two times a day (BID) | CUTANEOUS | 4 refills | Status: DC

## 2022-12-04 MED ORDER — CLOBETASOL PROPIONATE 0.05 % EX CREA: 1.0000 | TOPICAL_CREAM | Freq: Two times a day (BID) | CUTANEOUS | 4 refills | Status: AC

## 2022-12-04 MED ORDER — CYCLOBENZAPRINE HCL 5 MG PO TABS: 5.0000 mg | ORAL_TABLET | Freq: Every evening | ORAL | 5 refills | Status: AC | PRN

## 2022-12-04 MED ORDER — ACYCLOVIR 800 MG PO TABS: 800.0000 mg | ORAL_TABLET | Freq: Three times a day (TID) | ORAL | 0 refills | Status: AC

## 2022-12-04 NOTE — Progress Notes (Signed)
BP 128/85   Pulse 81   Temp 97.7 F (36.5 C) (Oral)   Ht 5' 2.99" (1.6 m)   Wt 243 lb 8 oz (110.5 kg)   SpO2 96%   BMI 43.15 kg/m    Subjective:    Patient ID: [name], male    DOB: August 04, 1966, 56 y.o.   MRN: [id number]  HPI: [name] is a 56 y.o. male  Chief Complaint  Patient presents with   Rash    Patient states that the rash is getting better. Patient states that the clobetasol cream is working ok, but need to be prescribed a bigger tube since it is all over his arms and legs.   Brought his dialysis labs in = A1c 6.5%, Phosphorous 8.4, potassium 4.8, HGB 11.5.   RASH Follow-up for rash today -- was treated with Acyclovir for rash due to varicella appearance.  Using Benadryl and Calamine for rash.  Ordered steroid cream last visit and this has been beneficial.  Still has some rash present, does feel this is improving.  Follows with dermatology -- takes Skyrizi for plaque psoriasis.  Follows with Dr. Nehemiah Massed.  Duration:  weeks  Location: generalized  Itching: yes -- steroid cream offers benefit Burning: no Redness: yes, less red Oozing: no Scaling: no Blisters: no Painful: itching Fevers: no Change in detergents/soaps/personal care products: no Recent illness: no Recent travel:no History of same: no Context: fluctuating Alleviating factors: hydrocortisone cream and benadryl Treatments attempted:hydrocortisone cream and benadryl Shortness of breath: no  Throat/tongue swelling: no Myalgias/arthralgias: no   Relevant past medical, surgical, family and social history reviewed and updated as indicated. Interim medical history since our last visit reviewed. Allergies and medications reviewed and updated.  Review of Systems  Constitutional:  Negative for activity change, diaphoresis, fatigue and fever.  Respiratory:  Negative for cough, chest tightness, shortness of breath and wheezing.   Cardiovascular:  Negative for chest pain, palpitations  and leg swelling.  Gastrointestinal: Negative.   Endocrine: Negative for cold intolerance, heat intolerance, polydipsia, polyphagia and polyuria.  Skin:  Positive for rash.  Neurological: Negative.   Psychiatric/Behavioral: Negative.     Per HPI unless specifically indicated above     Objective:    BP 128/85   Pulse 81   Temp 97.7 F (36.5 C) (Oral)   Ht 5' 2.99" (1.6 m)   Wt 243 lb 8 oz (110.5 kg)   SpO2 96%   BMI 43.15 kg/m   Wt Readings from Last 3 Encounters:  12/04/22 243 lb 8 oz (110.5 kg)  11/22/22 246 lb 3.2 oz (111.7 kg)  11/06/22 247 lb 9.6 oz (112.3 kg)    Physical Exam Vitals and nursing note reviewed.  Constitutional:      General: He is awake. He is not in acute distress.    Appearance: He is well-developed and well-groomed. He is morbidly obese. He is not ill-appearing or toxic-appearing.  HENT:     Head: Normocephalic and atraumatic.     Right Ear: Hearing normal. No drainage.     Left Ear: Hearing normal. No drainage.  Eyes:     General: Lids are normal.        Right eye: No discharge.        Left eye: No discharge.     Conjunctiva/sclera: Conjunctivae normal.     Pupils: Pupils are equal, round, and reactive to light.  Neck:     Thyroid: No thyromegaly.     Vascular: No carotid  bruit.  Cardiovascular:     Rate and Rhythm: Normal rate and regular rhythm.     Heart sounds: Normal heart sounds, S1 normal and S2 normal. No murmur heard.    No gallop.     Arteriovenous access: Left arteriovenous access is present. Pulmonary:     Effort: Pulmonary effort is normal. No accessory muscle usage or respiratory distress.     Breath sounds: Normal breath sounds.     Comments: O2 by nasal cannula in place, 3L. Abdominal:     General: Bowel sounds are normal. There is no distension.     Palpations: Abdomen is soft.     Tenderness: There is no abdominal tenderness.  Musculoskeletal:        General: Normal range of motion.     Cervical back: Normal range of  motion and neck supple.     Right lower leg: No edema.     Left lower leg: No edema.  Lymphadenopathy:     Cervical: No cervical adenopathy.  Skin:    General: Skin is warm and dry.     Capillary Refill: Capillary refill takes less than 2 seconds.     Findings: Rash present. Rash is papular.     Comments: Multiple areas to bilateral arms, abdomen, and sides of face with cohesive rash in cluster type and individual areas -- some areas now drying up and other areas healed, there remain some clusters with blisters present.  Round in shape with no open vesicles, crusting and scaling present.  No linear marks.  Neurological:     Mental Status: He is alert and oriented to person, place, and time.     Deep Tendon Reflexes: Reflexes are normal and symmetric.  Psychiatric:        Attention and Perception: Attention normal.        Mood and Affect: Mood normal.        Speech: Speech normal.        Behavior: Behavior normal. Behavior is cooperative.        Thought Content: Thought content normal.     Results for orders placed or performed during the hospital encounter of 10/24/22  Comprehensive metabolic panel  Result Value Ref Range   Sodium 140 135 - 145 mmol/L   Potassium 3.5 3.5 - 5.1 mmol/L   Chloride 100 98 - 111 mmol/L   CO2 29 22 - 32 mmol/L   Glucose, Bld 82 70 - 99 mg/dL   BUN 31 (H) 6 - 20 mg/dL   Creatinine, Ser 7.44 (H) 0.61 - 1.24 mg/dL   Calcium 8.3 (L) 8.9 - 10.3 mg/dL   Total Protein 8.2 (H) 6.5 - 8.1 g/dL   Albumin 3.7 3.5 - 5.0 g/dL   AST 25 15 - 41 U/L   ALT 37 0 - 44 U/L   Alkaline Phosphatase 169 (H) 38 - 126 U/L   Total Bilirubin 0.8 0.3 - 1.2 mg/dL   GFR, Estimated 8 (L) >60 mL/min   Anion gap 11 5 - 15  CBC  Result Value Ref Range   WBC 10.9 (H) 4.0 - 10.5 K/uL   RBC 4.42 4.22 - 5.81 MIL/uL   Hemoglobin 12.2 (L) 13.0 - 17.0 g/dL   HCT 37.8 (L) 39.0 - 52.0 %   MCV 85.5 80.0 - 100.0 fL   MCH 27.6 26.0 - 34.0 pg   MCHC 32.3 30.0 - 36.0 g/dL   RDW 17.1 (H)  11.5 - 15.5 %   Platelets 180 150 -  400 K/uL   nRBC 0.0 0.0 - 0.2 %  Type and screen [place]  Result Value Ref Range   ABO/RH(D) O POS    Antibody Screen NEG    Sample Expiration      10/27/2022,2359 Performed at [facility], [address] [place], [place] [address]       Assessment & Plan:   Problem List Items Addressed This Visit       Musculoskeletal and Integument   Rash - Primary    Acute and slowly improving with treatment.  Recommend he continue Benadryl as needed and Calamine.  ?varicella, in appearance it presents similarly.  Will extend Acyclovir renal dosed her his CrCl 18 = 800 MG every 8 hours and continue Clobetasol to areas of rash.  Recommend oatmeal wash or baths for comfort.  Return in 2 weeks.        Follow up plan: Return in about 2 weeks (around 12/18/2022) for [facility].

## 2022-12-04 NOTE — Assessment & Plan Note (Signed)
Acute and slowly improving with treatment.  Recommend he continue Benadryl as needed and Calamine.  ?varicella, in appearance it presents similarly.  Will extend Acyclovir renal dosed her his CrCl 18 = 800 MG every 8 hours and continue Clobetasol to areas of rash.  Recommend oatmeal wash or baths for comfort.  Return in 2 weeks.

## 2022-12-06 ENCOUNTER — Encounter: Payer: Self-pay | Admitting: Podiatry

## 2022-12-06 ENCOUNTER — Encounter (INDEPENDENT_AMBULATORY_CARE_PROVIDER_SITE_OTHER): Payer: Medicare Other

## 2022-12-06 ENCOUNTER — Ambulatory Visit (INDEPENDENT_AMBULATORY_CARE_PROVIDER_SITE_OTHER): Payer: Medicare Other | Admitting: Podiatry

## 2022-12-06 ENCOUNTER — Ambulatory Visit (INDEPENDENT_AMBULATORY_CARE_PROVIDER_SITE_OTHER): Payer: Medicare Other | Admitting: Vascular Surgery

## 2022-12-06 DIAGNOSIS — M79676 Pain in unspecified toe(s): Secondary | ICD-10-CM | POA: Diagnosis not present

## 2022-12-06 DIAGNOSIS — E1142 Type 2 diabetes mellitus with diabetic polyneuropathy: Secondary | ICD-10-CM

## 2022-12-06 DIAGNOSIS — N186 End stage renal disease: Secondary | ICD-10-CM

## 2022-12-06 DIAGNOSIS — I872 Venous insufficiency (chronic) (peripheral): Secondary | ICD-10-CM

## 2022-12-06 DIAGNOSIS — B351 Tinea unguium: Secondary | ICD-10-CM

## 2022-12-06 NOTE — Progress Notes (Signed)
This patient returns to my office for at risk foot care.  This patient requires this care by a professional since this patient will be at risk due to having diabetes mellitus and ESRD.  This patient is unable to cut nails himself since the patient cannot reach his nails.These nails are painful walking and wearing shoes.  This patient presents for at risk foot care today.  General Appearance  Alert, conversant and in no acute stress.  Vascular  Dorsalis pedis and posterior tibial  pulses are weakly  palpable due to swelling. bilaterally.  Capillary return is within normal limits  bilaterally. Temperature is within normal limits  Bilaterally.  Venous stasis  B/L.  Neurologic  Senn-Weinstein monofilament wire test within normal limits  bilaterally. Muscle power within normal limits bilaterally.  Nails Thick disfigured discolored nails with subungual debris  from hallux to fifth toes bilaterally. No evidence of bacterial infection or drainage bilaterally.  Orthopedic  No limitations of motion  feet .  No crepitus or effusions noted.  No bony pathology or digital deformities noted.  Skin  normotropic skin with no porokeratosis noted bilaterally.  No signs of infections or ulcers noted.    Onychomycosis  Pain in right toes  Pain in left toes  Consent was obtained for treatment procedures.   Mechanical debridement of nails 1-5  bilaterally performed with a nail nipper.  Filed with dremel without incident.    Return office visit  10 weeks                   Told patient to return for periodic foot care and evaluation due to potential at risk complications.   Gardiner Barefoot DPM

## 2022-12-11 ENCOUNTER — Other Ambulatory Visit: Payer: Self-pay | Admitting: Nurse Practitioner

## 2022-12-11 ENCOUNTER — Other Ambulatory Visit: Payer: Self-pay

## 2022-12-11 MED ORDER — CLOPIDOGREL BISULFATE 75 MG PO TABS: 75.0000 mg | ORAL_TABLET | Freq: Every day | ORAL | 3 refills | Status: AC

## 2022-12-11 MED ORDER — DAPAGLIFLOZIN PROPANEDIOL 10 MG PO TABS: 10.0000 mg | ORAL_TABLET | Freq: Every day | ORAL | 3 refills | Status: AC

## 2022-12-11 NOTE — Telephone Encounter (Signed)
Requested medication (s) are due for refill today: yes   Requested medication (s) are on the active medication list: yes   Last refill:  10/05/22 #30 with 2 refills   Future visit scheduled: yes   Notes to clinic:  Please review for refill. Unable to refill per protocol.     Requested Prescriptions  Pending Prescriptions Disp Refills   BELSOMRA 10 MG TABS [Pharmacy Med Name: Belsomra 10 mg tablet] 30 tablet 2    Sig: TAKE ONE TABLET BY MOUTH AT BEDTIME AS NEEDED     Off-Protocol Failed - 12/11/2022  8:03 AM      Failed - Medication not assigned to a protocol, review manually.      Passed - Valid encounter within last 12 months    Recent Outpatient Visits           1 week ago [place], [place] T, NP   2 weeks ago [place], [place] T, NP   2 months ago Type 2 diabetes mellitus with hyperglycemia, with long-term current use of insulin ([place])   [place], [place] T, NP   5 months ago Type 2 diabetes mellitus with hyperglycemia, with long-term current use of insulin ([place])   [place], Jolene T, NP   9 months ago Type 2 diabetes mellitus with hyperglycemia, with long-term current use of insulin ([place])   [place], Barbaraann Faster, NP       Future Appointments             In 2 weeks Ralene Bathe, MD [place]   In 3 weeks [place], Barbaraann Faster, NP [facility] [place]   In 4 weeks Kate Sable, MD [place]. [place]   In 1 month Stoio[place], Ronda Fairly, MD [facility] [place]   In 1 month [place], Barbaraann Faster, NP [facility]

## 2022-12-19 ENCOUNTER — Telehealth: Payer: Self-pay | Admitting: Nurse Practitioner

## 2022-12-27 ENCOUNTER — Ambulatory Visit: Payer: Medicare Other | Admitting: Dermatology

## 2023-01-03 ENCOUNTER — Ambulatory Visit: Payer: Medicare Other | Admitting: Dermatology

## 2023-01-07 ENCOUNTER — Other Ambulatory Visit: Payer: Self-pay | Admitting: Urology

## 2023-01-07 ENCOUNTER — Ambulatory Visit: Payer: Medicare Other | Admitting: Nurse Practitioner

## 2023-01-08 ENCOUNTER — Ambulatory Visit: Payer: Medicare Other | Admitting: Cardiology

## 2023-01-14 ENCOUNTER — Encounter (INDEPENDENT_AMBULATORY_CARE_PROVIDER_SITE_OTHER): Payer: Medicare Other

## 2023-01-14 ENCOUNTER — Ambulatory Visit (INDEPENDENT_AMBULATORY_CARE_PROVIDER_SITE_OTHER): Payer: Medicare Other | Admitting: Vascular Surgery

## 2023-01-17 NOTE — Telephone Encounter (Signed)
Copied from [place] [id number]. Topic: General - Deceased Patient >> 2022/12/21  4:06 PM Chapman Fitch wrote: Reason for CRM: Pts daughter called to let provider know that the pt has passed    Route to department's PEC Pool.

## 2023-01-17 DEATH — deceased

## 2023-01-21 ENCOUNTER — Ambulatory Visit: Payer: Medicare Other | Admitting: Urology

## 2023-02-05 ENCOUNTER — Ambulatory Visit: Payer: Medicare Other | Admitting: Nurse Practitioner

## 2023-02-09 ENCOUNTER — Other Ambulatory Visit: Payer: Self-pay | Admitting: Nurse Practitioner

## 2023-02-09 DIAGNOSIS — E1165 Type 2 diabetes mellitus with hyperglycemia: Secondary | ICD-10-CM

## 2023-02-21 ENCOUNTER — Ambulatory Visit: Payer: Medicare Other | Admitting: Podiatry

## 2023-03-14 ENCOUNTER — Ambulatory Visit: Payer: Medicare Other | Admitting: Podiatry

## 2023-05-11 IMAGING — CT CT CERVICAL SPINE W/O CM
3 of 4 series · 12 of 33 positions shown, 14 images · non-contrast
Comparison: 01/27/2021

CLINICAL DATA: Altered mental status, history of fall yesterday,
initial encounter

EXAM:
CT HEAD WITHOUT CONTRAST
CT CERVICAL SPINE WITHOUT CONTRAST
TECHNIQUE: Multidetector CT imaging of the head and cervical spine was
performed following the standard protocol without intravenous
contrast. Multiplanar CT image reconstructions of the cervical spine
were also generated.

[Series 3: sagittal bone · sagittal · 0.26mm/px · 5 of 68 slices shown, 6 images]
[im 23/68  bone]
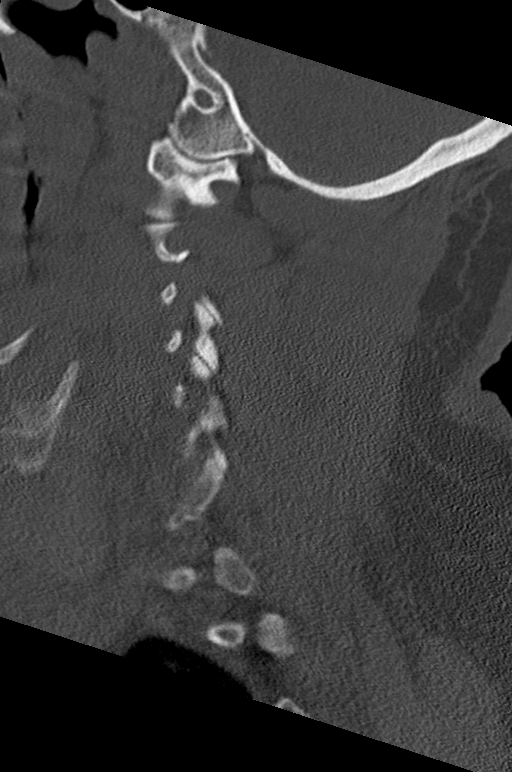
[im 28/68  bone]
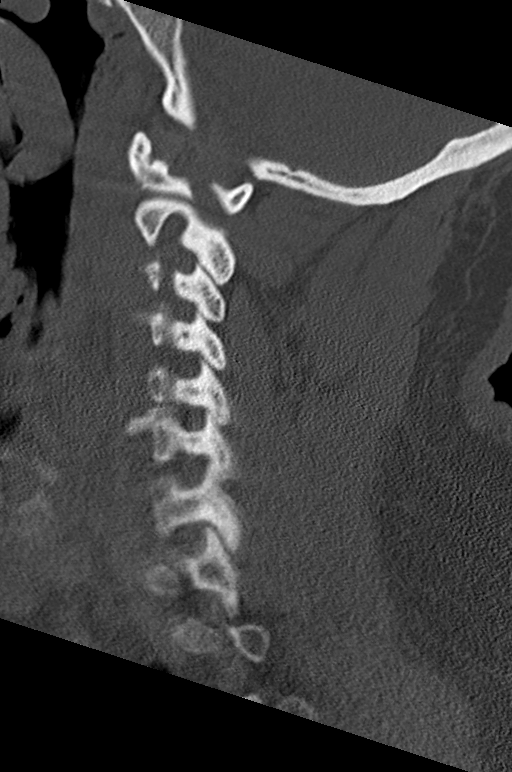
[im 34/68  soft-tissue]
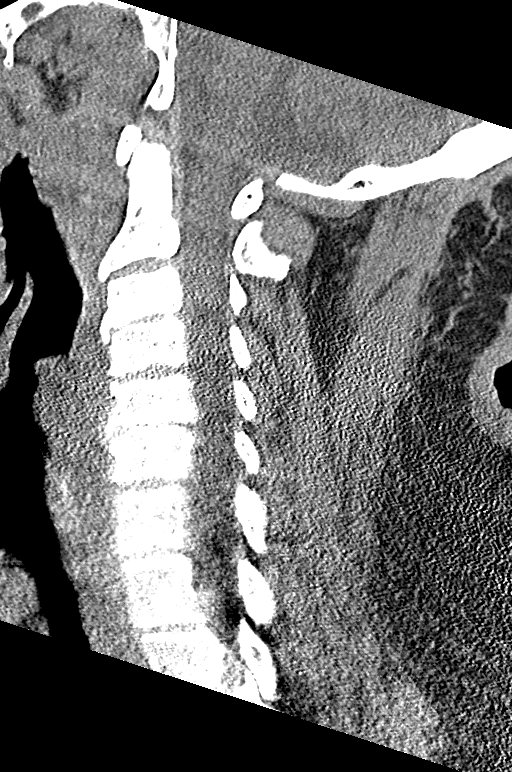
[im 34/68  bone]
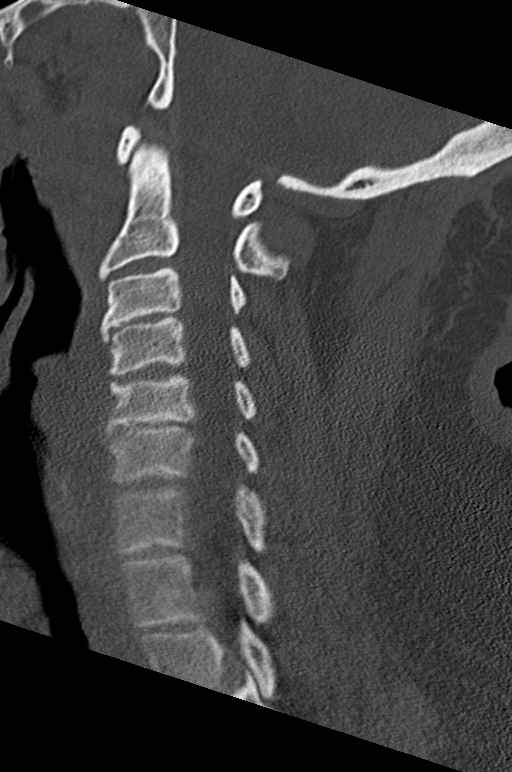
[im 40/68  bone]
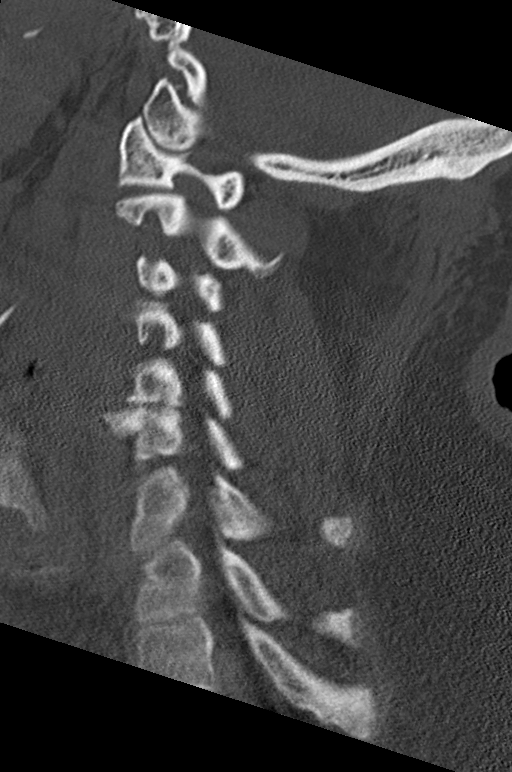
[im 45/68  bone]
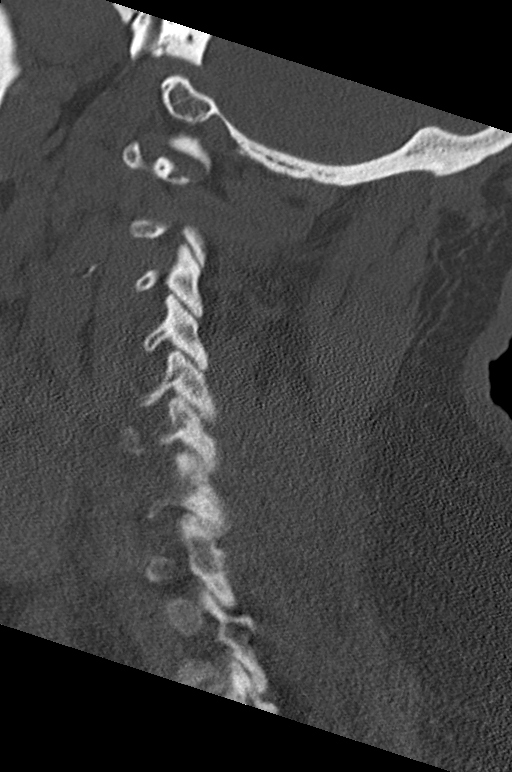

[Series 4: coronal bone · coronal · 0.26mm/px · 3 of 59 slices shown]
[im 12/59  bone]
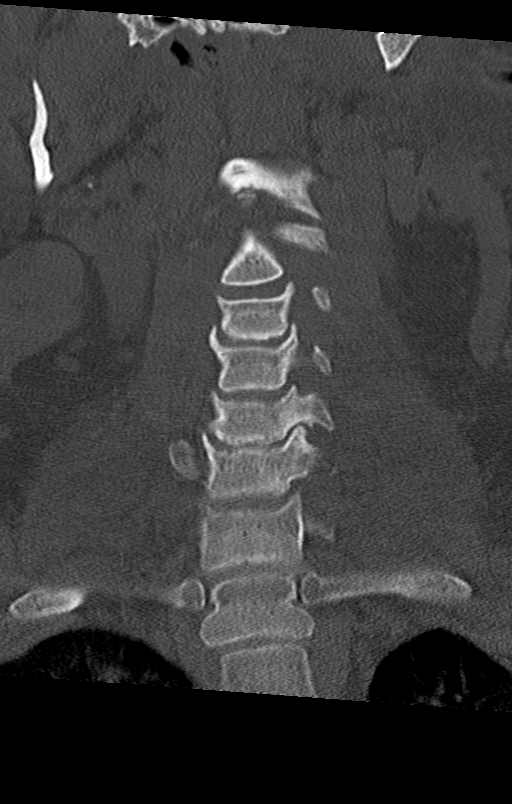
[im 24/59  bone]
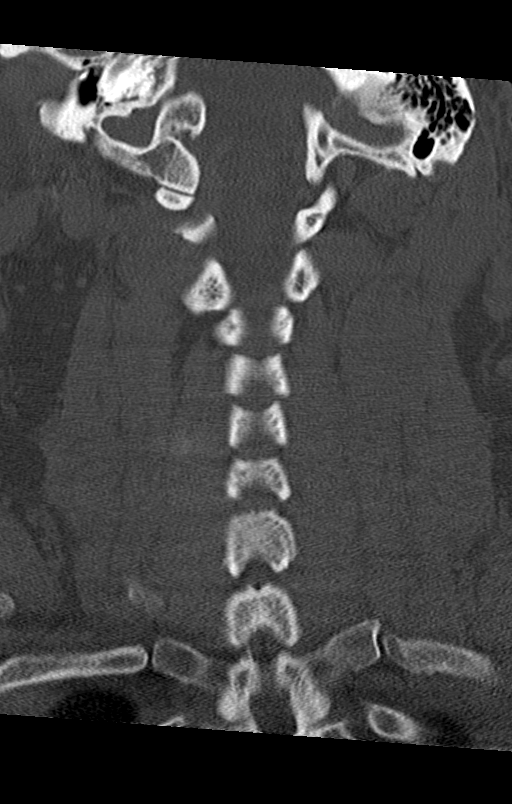
[im 35/59  bone]
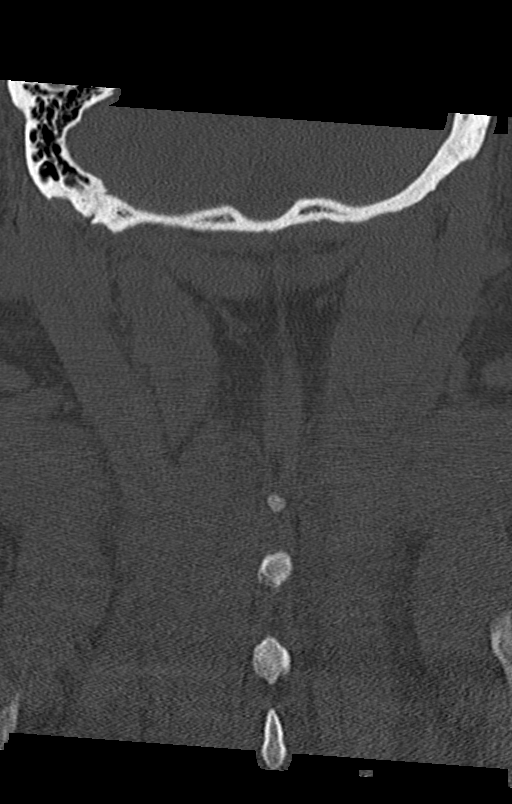

[Series 5: orthogonal bone · axial · 0.27mm/px · z∈[+389,+501]mm · 4 of 92 slices shown, 5 images]
[im 16/92  soft-tissue]
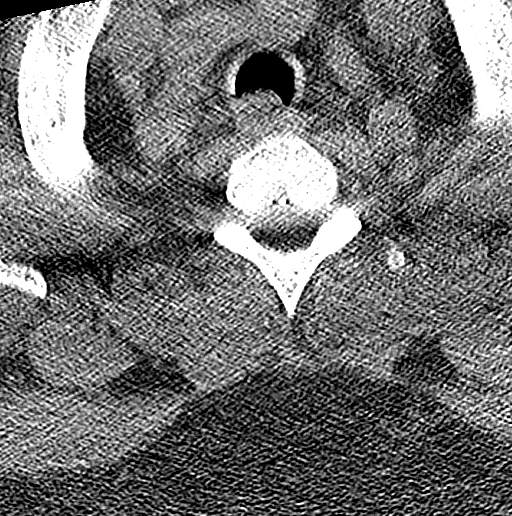
[im 16/92  bone]
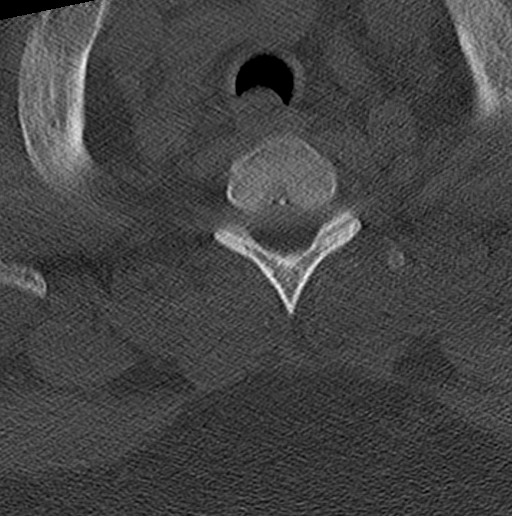
[im 31/92  bone]
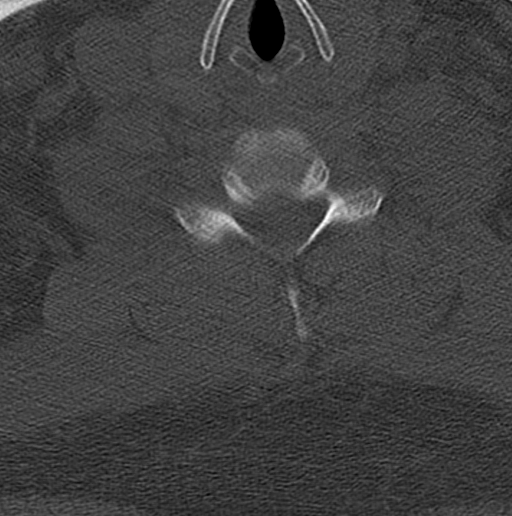
[im 61/92  bone]
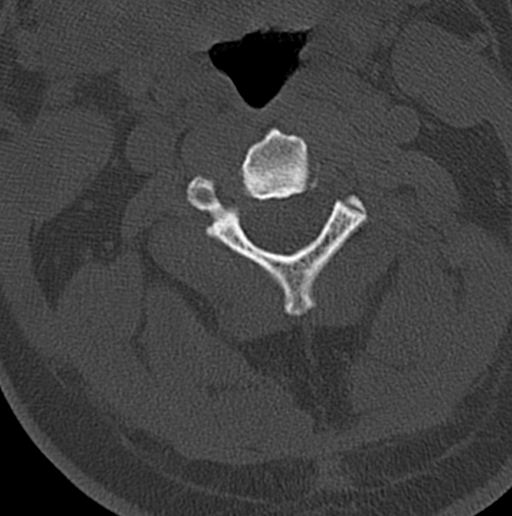
[im 76/92  bone]
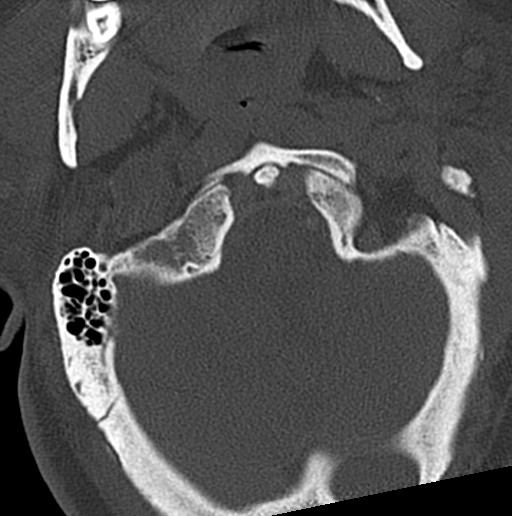

[12 of 33 positions shown; findings below may reference images not displayed]

FINDINGS: CT HEAD FINDINGS

Brain: No evidence of acute infarction, hemorrhage, hydrocephalus,
extra-axial collection or mass lesion/mass effect. Chronic atrophic
and ischemic changes are again identified and stable.

Vascular: No hyperdense vessel or unexpected calcification.

Skull: Normal. Negative for fracture or focal lesion.

Sinuses/Orbits: No acute finding.

Other: None.

CT CERVICAL SPINE FINDINGS

Alignment: Normal.

Skull base and vertebrae: 7 cervical segments are well visualized.
Vertebral body height is well maintained. Disc space narrowing and
osteophytic changes are noted. No acute fracture or acute facet
abnormality is noted.

Soft tissues and spinal canal: Surrounding soft tissue structures
are within normal limits.

Upper chest: Visualized lung apices are within normal limits.

Other: None
IMPRESSION: CT of the head: Chronic atrophic and ischemic changes without acute
abnormality.

CT of the cervical spine: Multilevel degenerative change without
acute abnormality.

## 2023-05-14 IMAGING — DX DG CHEST 1V PORT
1 series · 1 of 1 positions shown · non-contrast
Comparison: May 08, 2021.

CLINICAL DATA: Acute respiratory failure with hypoxia.

EXAM:
PORTABLE CHEST 1 VIEW

[chest ap]
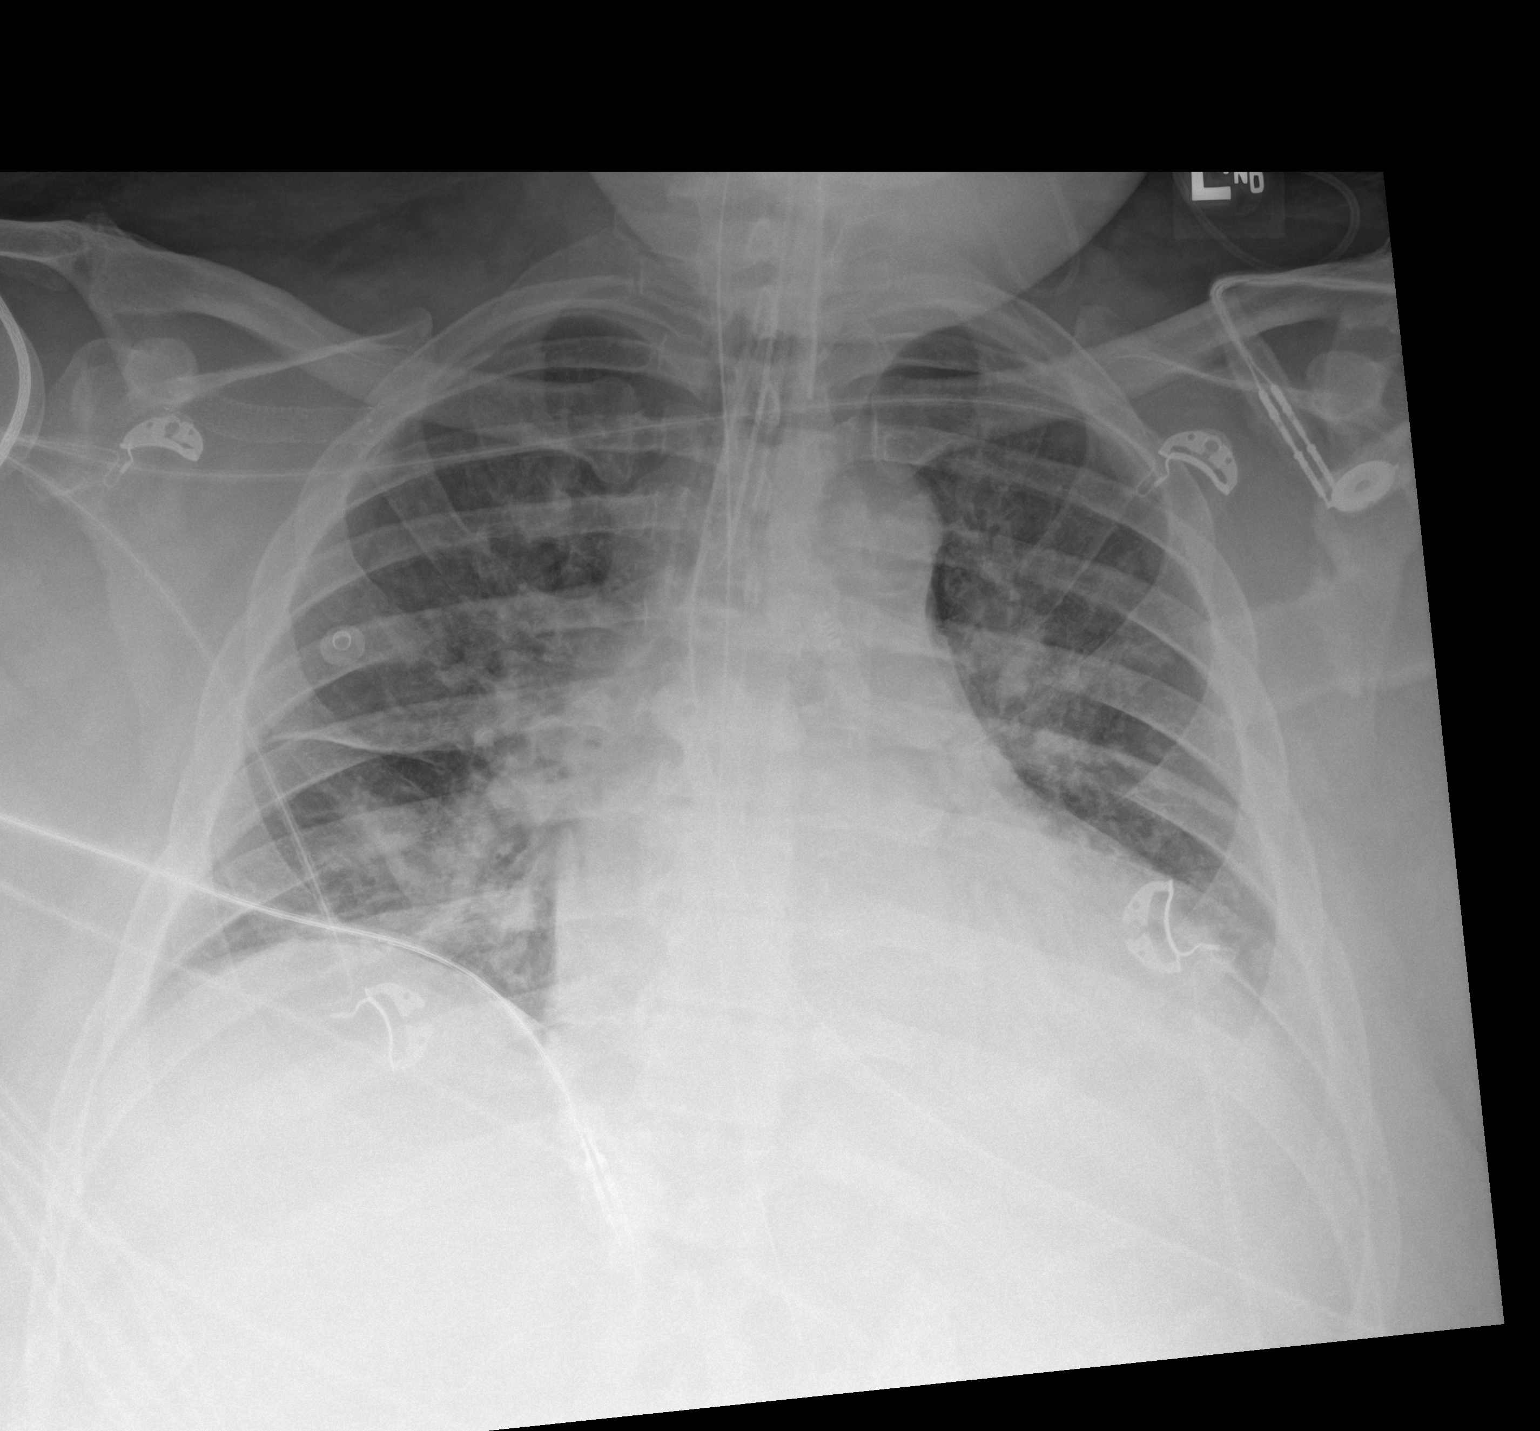

[1 of 1 positions shown; findings below may reference images not displayed]

FINDINGS: Endotracheal tube tip is approximately 2.8 cm above the carina.
Enteric tube courses below the diaphragm in outside the field of
view. Similar enlarged cardiac silhouette, accentuated by AP
technique. Pulmonary vascular congestion. Patchy airspace opacities,
slightly increased at the left lung base. Similar possible small
left pleural effusion. No visible pneumothorax on this limited semi
erect AP radiograph.
IMPRESSION: 1. Similar cardiomegaly and pulmonary vascular congestion. Possible
small left pleural effusion.
2. Patchy airspace opacities (slightly increased at the left lung
base), which could represent edema and/or infection.

## 2023-05-14 IMAGING — DX DG CHEST 1V PORT
1 series · 1 of 1 positions shown · non-contrast
Comparison: Same day.

CLINICAL DATA: Line placement.

EXAM:
PORTABLE CHEST 1 VIEW

[chest ap]
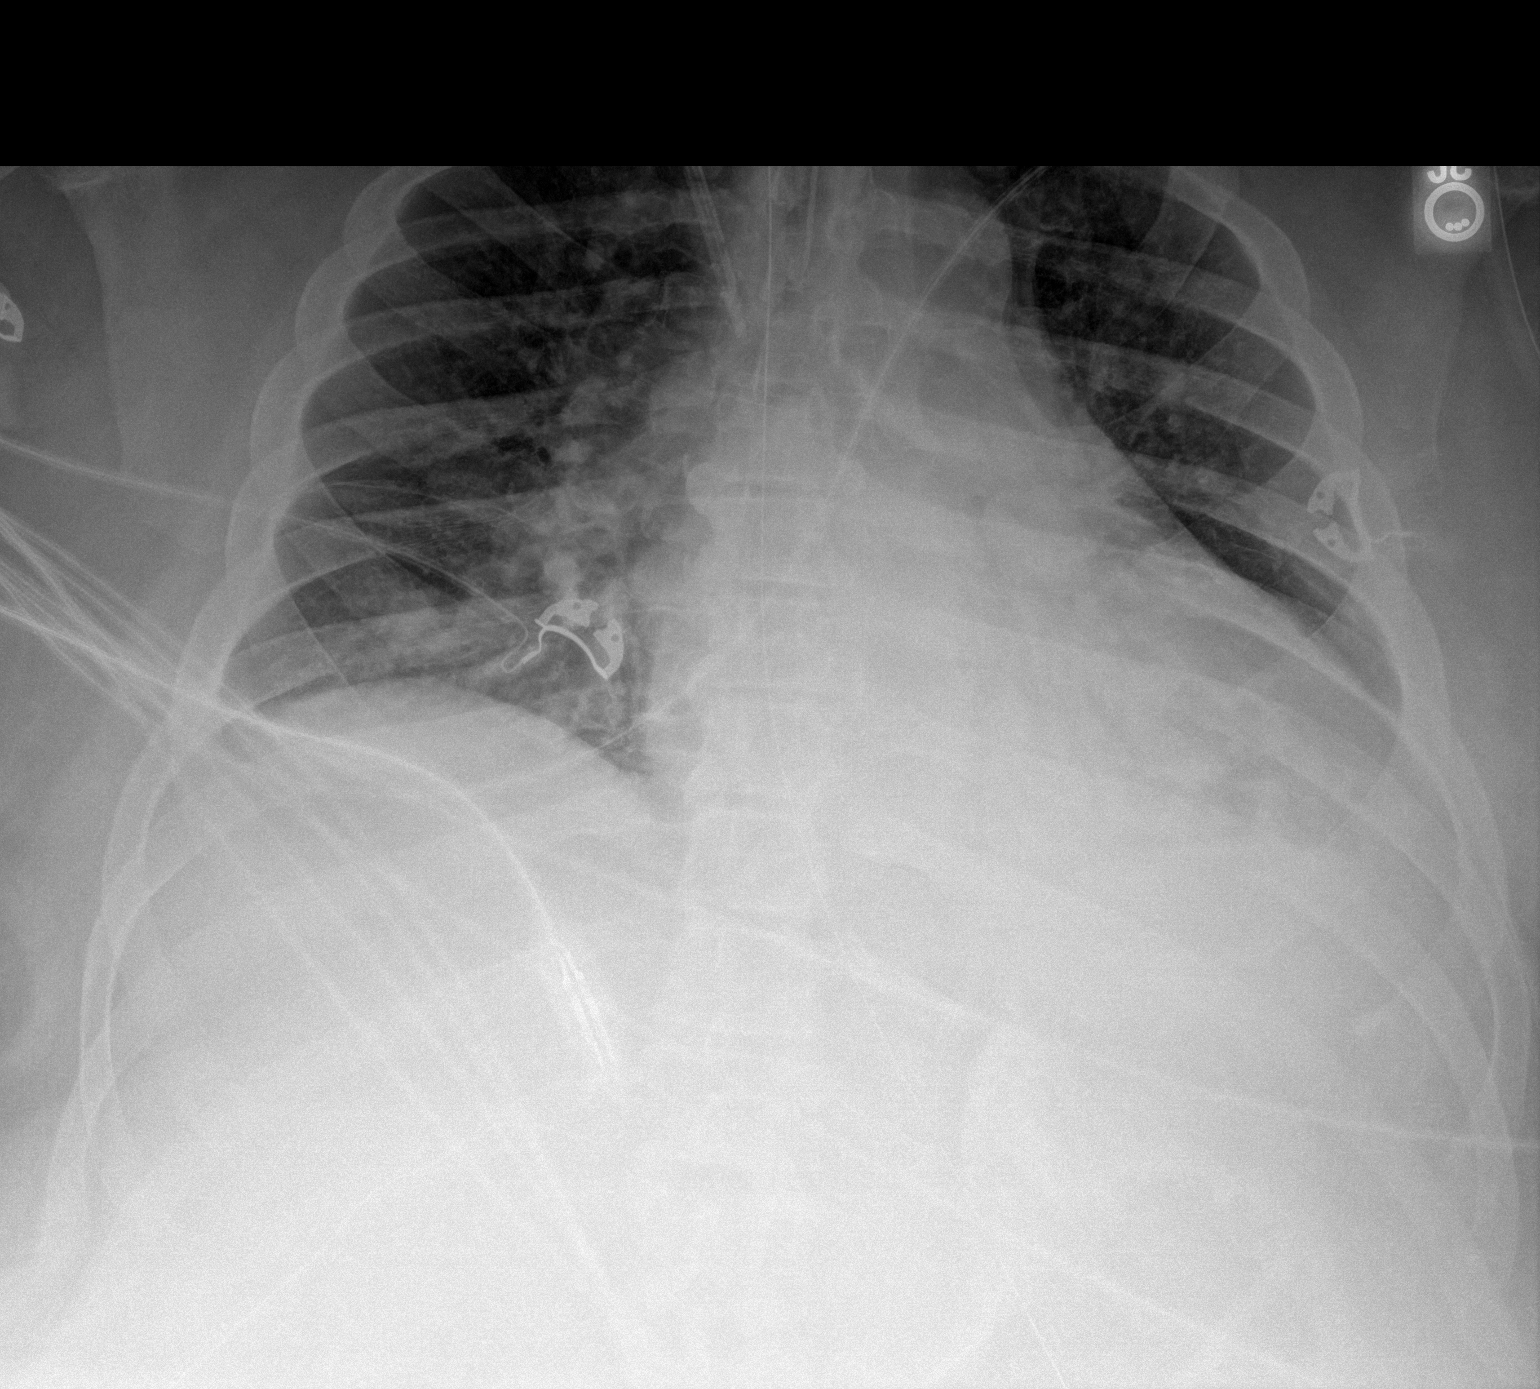

[1 of 1 positions shown; findings below may reference images not displayed]

FINDINGS: Stable cardiomegaly. Endotracheal and nasogastric tubes are
unchanged in position. Interval placement right internal jugular
catheter with distal tip in expected position of the SVC. No
pneumothorax is noted. Mild bibasilar subsegmental atelectasis is
noted. Bony thorax is unremarkable.
IMPRESSION: Interval placement of right internal jugular catheter with distal
tip in expected position of the SVC. No pneumothorax is noted.
